# Patient Record
Sex: Female | Born: 1945
Health system: Southern US, Community
[De-identification: ages and names within clinical notes are randomized; demographics above are authoritative.]

## PROBLEM LIST (undated history)

## (undated) DIAGNOSIS — M1990 Unspecified osteoarthritis, unspecified site: Secondary | ICD-10-CM

## (undated) DIAGNOSIS — Z86711 Personal history of pulmonary embolism: Secondary | ICD-10-CM

## (undated) DIAGNOSIS — J45909 Unspecified asthma, uncomplicated: Secondary | ICD-10-CM

## (undated) DIAGNOSIS — J449 Chronic obstructive pulmonary disease, unspecified: Secondary | ICD-10-CM

## (undated) DIAGNOSIS — E785 Hyperlipidemia, unspecified: Secondary | ICD-10-CM

## (undated) DIAGNOSIS — M109 Gout, unspecified: Secondary | ICD-10-CM

## (undated) DIAGNOSIS — I10 Essential (primary) hypertension: Secondary | ICD-10-CM

## (undated) DIAGNOSIS — K219 Gastro-esophageal reflux disease without esophagitis: Secondary | ICD-10-CM

## (undated) DIAGNOSIS — H269 Unspecified cataract: Secondary | ICD-10-CM

## (undated) DIAGNOSIS — R7303 Prediabetes: Secondary | ICD-10-CM

## (undated) HISTORY — DX: Unspecified osteoarthritis, unspecified site: M19.90

## (undated) HISTORY — DX: Prediabetes: R73.03

## (undated) HISTORY — DX: Gastro-esophageal reflux disease without esophagitis: K21.9

## (undated) HISTORY — DX: Essential (primary) hypertension: I10

## (undated) HISTORY — DX: Unspecified asthma, uncomplicated: J45.909

## (undated) HISTORY — PX: BACK SURGERY: SHX140

## (undated) HISTORY — DX: Hyperlipidemia, unspecified: E78.5

## (undated) HISTORY — PX: TONSILLECTOMY: SUR1361

## (undated) HISTORY — DX: Chronic obstructive pulmonary disease, unspecified: J44.9

## (undated) HISTORY — PX: LUMBAR LAMINECTOMY: SHX95

## (undated) HISTORY — DX: Unspecified cataract: H26.9

## (undated) HISTORY — DX: Personal history of pulmonary embolism: Z86.711

## (undated) HISTORY — DX: Gout, unspecified: M10.9

---

## 2006-03-03 ENCOUNTER — Emergency Department: Payer: Self-pay | Admitting: Internal Medicine

## 2006-06-22 DIAGNOSIS — I10 Essential (primary) hypertension: Secondary | ICD-10-CM | POA: Insufficient documentation

## 2007-07-18 ENCOUNTER — Other Ambulatory Visit: Payer: Self-pay

## 2007-07-18 ENCOUNTER — Emergency Department: Payer: Self-pay

## 2008-07-09 DIAGNOSIS — J449 Chronic obstructive pulmonary disease, unspecified: Secondary | ICD-10-CM | POA: Insufficient documentation

## 2009-07-22 ENCOUNTER — Ambulatory Visit: Payer: Self-pay | Admitting: Ophthalmology

## 2009-08-05 ENCOUNTER — Ambulatory Visit: Payer: Self-pay | Admitting: Ophthalmology

## 2010-04-22 ENCOUNTER — Ambulatory Visit: Payer: Self-pay | Admitting: Family Medicine

## 2011-11-02 ENCOUNTER — Ambulatory Visit: Payer: Self-pay | Admitting: Family Medicine

## 2013-03-28 ENCOUNTER — Ambulatory Visit: Payer: Self-pay | Admitting: Family Medicine

## 2014-04-04 ENCOUNTER — Ambulatory Visit: Payer: Self-pay | Admitting: Family Medicine

## 2014-04-08 ENCOUNTER — Ambulatory Visit: Payer: Self-pay | Admitting: Family Medicine

## 2014-04-08 HISTORY — PX: BREAST EXCISIONAL BIOPSY: SUR124

## 2014-04-08 HISTORY — PX: BREAST BIOPSY: SHX20

## 2014-05-20 LAB — SURGICAL PATHOLOGY

## 2014-07-25 ENCOUNTER — Encounter: Payer: Self-pay | Admitting: Emergency Medicine

## 2014-07-25 ENCOUNTER — Other Ambulatory Visit: Payer: Self-pay | Admitting: Family Medicine

## 2014-07-30 ENCOUNTER — Ambulatory Visit (INDEPENDENT_AMBULATORY_CARE_PROVIDER_SITE_OTHER): Payer: Medicare Other | Admitting: Family Medicine

## 2014-07-30 ENCOUNTER — Encounter: Payer: Self-pay | Admitting: Family Medicine

## 2014-07-30 VITALS — BP 130/70 | HR 105 | Temp 97.4°F | Resp 18 | Ht 66.0 in | Wt 307.6 lb

## 2014-07-30 DIAGNOSIS — R739 Hyperglycemia, unspecified: Secondary | ICD-10-CM | POA: Insufficient documentation

## 2014-07-30 DIAGNOSIS — E669 Obesity, unspecified: Secondary | ICD-10-CM | POA: Diagnosis not present

## 2014-07-30 DIAGNOSIS — M10079 Idiopathic gout, unspecified ankle and foot: Secondary | ICD-10-CM | POA: Diagnosis not present

## 2014-07-30 DIAGNOSIS — M179 Osteoarthritis of knee, unspecified: Secondary | ICD-10-CM | POA: Insufficient documentation

## 2014-07-30 DIAGNOSIS — J302 Other seasonal allergic rhinitis: Secondary | ICD-10-CM | POA: Insufficient documentation

## 2014-07-30 DIAGNOSIS — I10 Essential (primary) hypertension: Secondary | ICD-10-CM

## 2014-07-30 DIAGNOSIS — M150 Primary generalized (osteo)arthritis: Secondary | ICD-10-CM

## 2014-07-30 DIAGNOSIS — M159 Polyosteoarthritis, unspecified: Secondary | ICD-10-CM

## 2014-07-30 DIAGNOSIS — E785 Hyperlipidemia, unspecified: Secondary | ICD-10-CM | POA: Diagnosis not present

## 2014-07-30 DIAGNOSIS — J4530 Mild persistent asthma, uncomplicated: Secondary | ICD-10-CM

## 2014-07-30 DIAGNOSIS — Z23 Encounter for immunization: Secondary | ICD-10-CM | POA: Insufficient documentation

## 2014-07-30 DIAGNOSIS — J45909 Unspecified asthma, uncomplicated: Secondary | ICD-10-CM | POA: Insufficient documentation

## 2014-07-30 DIAGNOSIS — M1710 Unilateral primary osteoarthritis, unspecified knee: Secondary | ICD-10-CM | POA: Insufficient documentation

## 2014-07-30 DIAGNOSIS — M1990 Unspecified osteoarthritis, unspecified site: Secondary | ICD-10-CM

## 2014-07-30 DIAGNOSIS — M109 Gout, unspecified: Secondary | ICD-10-CM | POA: Insufficient documentation

## 2014-07-30 DIAGNOSIS — R7303 Prediabetes: Secondary | ICD-10-CM | POA: Insufficient documentation

## 2014-07-30 MED ORDER — ENALAPRIL MALEATE 20 MG PO TABS: 20.0000 mg | ORAL_TABLET | Freq: Every day | ORAL | Status: DC

## 2014-07-30 MED ORDER — HYDROCHLOROTHIAZIDE 12.5 MG PO CAPS: 12.5000 mg | ORAL_CAPSULE | Freq: Every day | ORAL | Status: DC

## 2014-07-30 MED ORDER — BUDESONIDE-FORMOTEROL FUMARATE 160-4.5 MCG/ACT IN AERO: 2.0000 | INHALATION_SPRAY | Freq: Two times a day (BID) | RESPIRATORY_TRACT | Status: DC

## 2014-07-30 MED ORDER — ALLOPURINOL 100 MG PO TABS: 100.0000 mg | ORAL_TABLET | Freq: Every day | ORAL | Status: DC

## 2014-07-30 MED ORDER — ALBUTEROL SULFATE HFA 108 (90 BASE) MCG/ACT IN AERS: 1.0000 | INHALATION_SPRAY | Freq: Four times a day (QID) | RESPIRATORY_TRACT | Status: DC | PRN

## 2014-07-30 NOTE — Progress Notes (Signed)
Name: Colleen [name]   MRN: [id number]    DOB: May 07, 1945   Date:07/30/2014       Progress Note  Subjective  Chief Complaint  Chief Complaint  Patient presents with  . Hypertension  . COPD  . Hyperlipidemia    Hypertension This is a chronic problem. The current episode started more than 1 year ago. The problem has been gradually worsening since onset. The problem is controlled. Associated symptoms include shortness of breath. Pertinent negatives include no blurred vision, chest pain, headaches, neck pain, orthopnea or palpitations. There are no associated agents to hypertension. Risk factors for coronary artery disease include obesity, post-menopausal state, sedentary lifestyle and stress. Past treatments include diuretics and ACE inhibitors. The current treatment provides moderate improvement. There are no compliance problems.   Hyperlipidemia This is a chronic problem. The current episode started more than 1 year ago. The problem is controlled. Recent lipid tests were reviewed and are normal. Exacerbating diseases include obesity. Factors aggravating her hyperlipidemia include fatty foods and thiazides. Associated symptoms include myalgias and shortness of breath. Pertinent negatives include no chest pain or focal weakness. Current antihyperlipidemic treatment includes statins. The current treatment provides moderate improvement of lipids. There are no compliance problems.  Risk factors for coronary artery disease include dyslipidemia, hypertension, obesity, post-menopausal, a sedentary lifestyle and stress.  Arthritis Presents for follow-up visit. She complains of pain, stiffness and joint swelling. Affected locations include the right knee and left knee. Pertinent negatives include no diarrhea, dysuria, fever or weight loss. Her past medical history is significant for chronic back pain. There is no history of rheumatoid arthritis.  Her pertinent risk factors include overuse. Her family medical  history includes family history of osteoarthritis. Past treatments include acetaminophen, activity, OTC med, corticosteroids and NSAIDs. The treatment provided mild relief. Factors aggravating her arthritis include abduction, activity, climbing stairs, descending stairs, entering/exiting a vehicle, exposure to cold air and lifting. Compliance with prior treatments has been good.  Breathing Problem She complains of cough, difficulty breathing, shortness of breath and wheezing. There is no hemoptysis. This is a chronic problem. The current episode started more than 1 year ago. The problem occurs intermittently. The problem has been unchanged. The cough is productive of sputum. Associated symptoms include myalgias. Pertinent negatives include no chest pain, fever, headaches, heartburn, sore throat or weight loss. Her symptoms are aggravated by change in weather, climbing stairs, exposure to smoke and strenuous activity. Her symptoms are alleviated by beta-agonist and steroid inhaler. She reports moderate improvement on treatment. Her past medical history is significant for asthma and COPD.     Gout  No recent flare of gout. She remains on her usual regimen of allopurinol  Past Medical History  Diagnosis Date  . Hyperlipidemia   . Hypertension   . Gout   . Asthma   . Osteoarthrosis   . COPD (chronic obstructive pulmonary disease)     History  Substance Use Topics  . Smoking status: Former Smoker    Quit date: 11/13/2013  . Smokeless tobacco: Not on file  . Alcohol Use: Not on file     Current outpatient prescriptions:  .  albuterol (PROVENTIL HFA;VENTOLIN HFA) 108 (90 BASE) MCG/ACT inhaler, Inhale 1 puff into the lungs every 6 (six) hours as needed for wheezing or shortness of breath., Disp: 1 Inhaler, Rfl: 5 .  albuterol (PROVENTIL) (2.5 MG/3ML) 0.083% nebulizer solution, Take 2.5 mg by nebulization every 6 (six) hours as needed for wheezing or shortness of breath., Disp: ,  Rfl:  .   allopurinol (ZYLOPRIM) 100 MG tablet, Take 1 tablet (100 mg total) by mouth daily., Disp: 30 tablet, Rfl: 5 .  aspirin EC 81 MG tablet, Take 81 mg by mouth daily., Disp: , Rfl:  .  budesonide-formoterol (SYMBICORT) 160-4.5 MCG/ACT inhaler, Inhale 2 puffs into the lungs 2 (two) times daily., Disp: 1 Inhaler, Rfl: 5 .  enalapril (VASOTEC) 20 MG tablet, Take 1 tablet (20 mg total) by mouth daily., Disp: 30 tablet, Rfl: 5 .  hydrochlorothiazide (MICROZIDE) 12.5 MG capsule, Take 1 capsule (12.5 mg total) by mouth daily., Disp: 30 capsule, Rfl: 5 .  lovastatin (MEVACOR) 40 MG tablet, TAKE ONE TABLET BY MOUTH AT BEDTIME, Disp: 30 tablet, Rfl: 0 .  naproxen (NAPROSYN) 500 MG tablet, Take 500 mg by mouth 2 (two) times daily with a meal., Disp: , Rfl:   No Known Allergies  Review of Systems  Constitutional: Negative for fever, chills and weight loss.  HENT: Negative for congestion, hearing loss, sore throat and tinnitus.   Eyes: Negative for blurred vision, double vision and redness.  Respiratory: Positive for cough, shortness of breath and wheezing. Negative for hemoptysis.   Cardiovascular: Negative for chest pain, palpitations, orthopnea, claudication and leg swelling.  Gastrointestinal: Negative for heartburn, nausea, vomiting, diarrhea, constipation and blood in stool.  Genitourinary: Negative for dysuria, urgency, frequency and hematuria.  Musculoskeletal: Positive for myalgias, joint swelling, arthritis and stiffness. Negative for back pain, joint pain, falls and neck pain.  Skin: Negative for itching.  Neurological: Negative for dizziness, tingling, tremors, focal weakness, seizures, loss of consciousness, weakness and headaches.  Endo/Heme/Allergies: Does not bruise/bleed easily.  Psychiatric/Behavioral: Negative for depression and substance abuse. The patient is not nervous/anxious and does not have insomnia.      Objective  Filed Vitals:   07/30/14 0738  BP: 130/70  Pulse: 105  Temp:  97.4 F (36.3 C)  TempSrc: Oral  Resp: 18  Height: 5\' 6"  (1.676 m)  Weight: 307 lb 9.6 oz (139.526 kg)  SpO2: 94%     Physical Exam  Constitutional: She is oriented to person, place, and time and well-developed, well-nourished, and in no distress.  Morbidly obese  HENT:  Head: Normocephalic.  Eyes: EOM are normal. Pupils are equal, round, and reactive to light.  Neck: Normal range of motion. No thyromegaly present.  Cardiovascular: Normal rate, regular rhythm and normal heart sounds.   No murmur heard. Pulmonary/Chest: Effort normal and breath sounds normal.  Abdominal: Soft. Bowel sounds are normal.  Musculoskeletal: She exhibits tenderness. She exhibits no edema.  [place] arthritic changes at the knee with valgus deformity of the right knee  Neurological: She is alert and oriented to person, place, and time. No cranial nerve deficit. Gait normal.  Skin: Skin is warm and dry. No rash noted.  Psychiatric: Memory and affect normal.      Assessment and plan  1. Benign essential HTN Well-controlled  - hydrochlorothiazide (MICROZIDE) 12.5 MG capsule; Take 1 capsule (12.5 mg total) by mouth daily.  Dispense: 30 capsule; Refill: 5 - enalapril (VASOTEC) 20 MG tablet; Take 1 tablet (20 mg total) by mouth daily.  Dispense: 30 tablet; Refill: 5 - Comprehensive metabolic panel - TSH  2. Acute gout of foot, unspecified cause, unspecified laterality No recent flare - allopurinol (ZYLOPRIM) 100 MG tablet; Take 1 tablet (100 mg total) by mouth daily.  Dispense: 30 tablet; Refill: 5 - Uric acid  3. Asthma, mild persistent, uncomplicated Stable - albuterol (PROVENTIL HFA;VENTOLIN HFA) 108 (  90 BASE) MCG/ACT inhaler; Inhale 1 puff into the lungs every 6 (six) hours as needed for wheezing or shortness of breath.  Dispense: 1 Inhaler; Refill: 5 - budesonide-formoterol (SYMBICORT) 160-4.5 MCG/ACT inhaler; Inhale 2 puffs into the lungs 2 (two) times daily.  Dispense: 1 Inhaler; Refill:  5  5. Primary osteoarthritis involving multiple joints Continues follow-up with orthopedist and intermittent injections  6. HLD (hyperlipidemia) Repeat lab today - Lipid panel  7. Obesity Handout on obesity encouraged to do whatever exercise she can

## 2014-07-30 NOTE — Patient Instructions (Signed)

## 2014-07-31 LAB — COMPREHENSIVE METABOLIC PANEL
ALT: 13 IU/L (ref 0–32)
AST: 22 IU/L (ref 0–40)
Albumin/Globulin Ratio: 1 — ABNORMAL LOW (ref 1.1–2.5)
Albumin: 4 g/dL (ref 3.6–4.8)
Alkaline Phosphatase: 124 IU/L — ABNORMAL HIGH (ref 39–117)
BUN/Creatinine Ratio: 19 (ref 11–26)
BUN: 20 mg/dL (ref 8–27)
Bilirubin Total: 0.4 mg/dL (ref 0.0–1.2)
CALCIUM: 10.2 mg/dL (ref 8.7–10.3)
CO2: 26 mmol/L (ref 18–29)
Chloride: 99 mmol/L (ref 97–108)
Creatinine, Ser: 1.03 mg/dL — ABNORMAL HIGH (ref 0.57–1.00)
GFR calc Af Amer: 65 mL/min/{1.73_m2} (ref >59)
GFR calc non Af Amer: 56 mL/min/{1.73_m2} — ABNORMAL LOW (ref >59)
Globulin, Total: 4 g/dL (ref 1.5–4.5)
Glucose: 111 mg/dL — ABNORMAL HIGH (ref 65–99)
POTASSIUM: 4.2 mmol/L (ref 3.5–5.2)
SODIUM: 139 mmol/L (ref 134–144)
Total Protein: 8 g/dL (ref 6.0–8.5)

## 2014-07-31 LAB — LIPID PANEL
CHOL/HDL RATIO: 2 ratio (ref 0.0–4.4)
Cholesterol, Total: 161 mg/dL (ref 100–199)
HDL: 82 mg/dL (ref >39)
LDL CALC: 62 mg/dL (ref 0–99)
Triglycerides: 83 mg/dL (ref 0–149)
VLDL CHOLESTEROL CAL: 17 mg/dL (ref 5–40)

## 2014-07-31 LAB — URIC ACID: URIC ACID: 4.8 mg/dL (ref 2.5–7.1)

## 2014-07-31 LAB — TSH: TSH: 2.59 u[IU]/mL (ref 0.450–4.500)

## 2014-07-31 NOTE — Progress Notes (Signed)
Patient notified by phone.

## 2014-08-26 ENCOUNTER — Other Ambulatory Visit: Payer: Self-pay | Admitting: Family Medicine

## 2014-09-25 ENCOUNTER — Other Ambulatory Visit: Payer: Self-pay | Admitting: Family Medicine

## 2014-10-21 ENCOUNTER — Other Ambulatory Visit: Payer: Self-pay | Admitting: Family Medicine

## 2014-11-05 ENCOUNTER — Telehealth: Payer: Self-pay | Admitting: Family Medicine

## 2014-11-05 DIAGNOSIS — J4530 Mild persistent asthma, uncomplicated: Secondary | ICD-10-CM

## 2014-11-05 NOTE — Telephone Encounter (Signed)
Patient insurance does not cover ProAir, however it will cover Ventolin 48mcg. Please send to [facility]. She does have appointment for 12-02-14

## 2014-11-08 MED ORDER — ALBUTEROL SULFATE HFA 108 (90 BASE) MCG/ACT IN AERS: 1.0000 | INHALATION_SPRAY | Freq: Four times a day (QID) | RESPIRATORY_TRACT | Status: DC | PRN

## 2014-11-08 NOTE — Telephone Encounter (Signed)
Script sent to pharmacy.

## 2014-11-15 ENCOUNTER — Other Ambulatory Visit: Payer: Self-pay | Admitting: Family Medicine

## 2014-11-27 ENCOUNTER — Other Ambulatory Visit: Payer: Self-pay | Admitting: Family Medicine

## 2014-12-02 ENCOUNTER — Encounter: Payer: Self-pay | Admitting: Family Medicine

## 2014-12-02 ENCOUNTER — Ambulatory Visit (INDEPENDENT_AMBULATORY_CARE_PROVIDER_SITE_OTHER): Payer: Medicare Other | Admitting: Family Medicine

## 2014-12-02 VITALS — BP 122/74 | HR 106 | Temp 97.7°F | Resp 16 | Ht 66.0 in | Wt 309.0 lb

## 2014-12-02 DIAGNOSIS — M159 Polyosteoarthritis, unspecified: Secondary | ICD-10-CM

## 2014-12-02 DIAGNOSIS — M1000 Idiopathic gout, unspecified site: Secondary | ICD-10-CM

## 2014-12-02 DIAGNOSIS — J42 Unspecified chronic bronchitis: Secondary | ICD-10-CM

## 2014-12-02 DIAGNOSIS — I10 Essential (primary) hypertension: Secondary | ICD-10-CM

## 2014-12-02 DIAGNOSIS — M150 Primary generalized (osteo)arthritis: Secondary | ICD-10-CM | POA: Diagnosis not present

## 2014-12-02 DIAGNOSIS — E162 Hypoglycemia, unspecified: Secondary | ICD-10-CM

## 2014-12-02 DIAGNOSIS — J4530 Mild persistent asthma, uncomplicated: Secondary | ICD-10-CM | POA: Diagnosis not present

## 2014-12-02 LAB — POCT GLYCOSYLATED HEMOGLOBIN (HGB A1C): HEMOGLOBIN A1C: 6

## 2014-12-02 LAB — GLUCOSE, POCT (MANUAL RESULT ENTRY): POC Glucose: 91 mg/dl (ref 70–99)

## 2014-12-02 MED ORDER — ALBUTEROL SULFATE (2.5 MG/3ML) 0.083% IN NEBU: 2.5000 mg | INHALATION_SOLUTION | Freq: Once | RESPIRATORY_TRACT | Status: DC

## 2014-12-02 NOTE — Progress Notes (Signed)
[name] 20 mg daily and hydrochlorothiazide 12.5 mg daily lisinopril 20 mg daily lisinopril lisinopril lisinoprilme: Colleen [name]   MRN: [id number]    DOB: November 15, 1945   Date:12/02/2014       Progress Note  Subjective  Chief Complaint  Chief Complaint  Patient presents with  . Hypertension    pt here for 4 month follow up  . Hyperlipidemia  . Gout  . COPD    HPI  Hypertension    Patient presents for follow-up of hypertension. It has been present for over 5 years.  Patient states that there is compliance with medical regimen which consists of lisinopril 20 mg daily and hydrochlorothiazide 12.5 mg daily . There is no end organ disease. Cardiac risk factors include hypertension hyperlipidemia and diabetes obesity and sedentary lifestyle.  Exercise regimen consist of  .  Diet consist of some salt restriction.  COPD history of present illness  Patient has a long-standing history of COPD. She stopped smoking many years ago. She currently has minimal dyspnea with exertion and no significant cough or wheezing. Current medical regimen includes Proventil when necessary and Symbicort  Hyperlipidemia  Patient has a history of hyperlipidemia for over 5 years.  Current medical regimen consist of lovastatin 40 mg daily at bedtime I's  Compliance is good .  Diet and exercise are currently followed fairly well .  Risk factors for cardiovascular disease include hyperlipidemia hypertension obesity sedentary lifestyle. Glucose intolerance.   There have been no side effects from the medication.    Gout  A recent flare of gout. She's currently on allopurinol.    Past Medical History  Diagnosis Date  . Hyperlipidemia   . Hypertension   . Gout   . Asthma   . Osteoarthrosis   . COPD (chronic obstructive pulmonary disease) ([place])     Social History  Substance Use Topics  . Smoking status: Former Smoker    Quit date: 11/13/2013  . Smokeless tobacco: Not on file  . Alcohol Use: Not on file      Current outpatient prescriptions:  .  albuterol (PROVENTIL HFA;VENTOLIN HFA) 108 (90 BASE) MCG/ACT inhaler, Inhale 1 puff into the lungs every 6 (six) hours as needed for wheezing or shortness of breath., Disp: 1 Inhaler, Rfl: 5 .  allopurinol (ZYLOPRIM) 100 MG tablet, Take 1 tablet (100 mg total) by mouth daily., Disp: 30 tablet, Rfl: 5 .  aspirin EC 81 MG tablet, Take 81 mg by mouth daily., Disp: , Rfl:  .  budesonide-formoterol (SYMBICORT) 160-4.5 MCG/ACT inhaler, Inhale 2 puffs into the lungs 2 (two) times daily., Disp: 1 Inhaler, Rfl: 5 .  enalapril (VASOTEC) 20 MG tablet, Take 1 tablet (20 mg total) by mouth daily., Disp: 30 tablet, Rfl: 5 .  hydrochlorothiazide (MICROZIDE) 12.5 MG capsule, Take 1 capsule (12.5 mg total) by mouth daily., Disp: 30 capsule, Rfl: 5 .  lovastatin (MEVACOR) 40 MG tablet, TAKE ONE TABLET BY MOUTH AT BEDTIME, Disp: 30 tablet, Rfl: 0 .  naproxen (NAPROSYN) 500 MG tablet, TAKE ONE TABLET BY MOUTH TWICE DAILY, Disp: 60 tablet, Rfl: 0  No Known Allergies  Review of Systems  Constitutional: Negative for fever, chills and weight loss.  HENT: Negative for congestion, hearing loss, sore throat and tinnitus.   Eyes: Negative for blurred vision, double vision and redness.  Respiratory: Positive for cough and wheezing. Negative for hemoptysis and shortness of breath.   Cardiovascular: Negative for chest pain, palpitations, orthopnea, claudication and leg swelling.  Gastrointestinal: Negative for heartburn,  nausea, vomiting, diarrhea, constipation and blood in stool.  Genitourinary: Negative for dysuria, urgency, frequency and hematuria.  Musculoskeletal: Positive for back pain. Negative for myalgias, joint pain, falls and neck pain.  Skin: Negative for itching.  Neurological: Negative for dizziness, tingling, tremors, focal weakness, seizures, loss of consciousness, weakness and headaches.  Endo/Heme/Allergies: Does not bruise/bleed easily.   Psychiatric/Behavioral: Negative for depression and substance abuse. The patient is not nervous/anxious and does not have insomnia.      Objective  Filed Vitals:   12/02/14 0814  BP: 122/74  Pulse: 106  Temp: 97.7 F (36.5 C)  Resp: 16  Height: 5\' 6"  (1.676 m)  Weight: 309 lb (140.161 kg)  SpO2: 96%     Physical Exam  Constitutional: She is oriented to person, place, and time and well-developed, well-nourished, and in no distress.  Obese and with some paucity of movement secondary to her joint pain  HENT:  Head: Normocephalic.  Eyes: EOM are normal. Pupils are equal, round, and reactive to light.  Neck: Normal range of motion. No thyromegaly present.  Cardiovascular: Normal rate, regular rhythm and normal heart sounds.   No murmur heard. Pulmonary/Chest: Effort normal and breath sounds normal.  Abdominal: Soft. Bowel sounds are normal.  Musculoskeletal: Normal range of motion. She exhibits tenderness. She exhibits no edema.  Neurological: She is alert and oriented to person, place, and time. No cranial nerve deficit. Gait normal.  Skin: Skin is warm and dry. No rash noted.  Psychiatric: Memory and affect normal.      Assessment & Plan  . 1. Chronic bronchitis, unspecified chronic bronchitis type (HCC)  - albuterol (PROVENTIL) (2.5 MG/3ML) 0.083% nebulizer solution 2.5 mg; Take 3 mLs (2.5 mg total) by nebulization once. - Spirometry: Pre & Post Eval  2. Asthma, mild persistent, uncomplicated stable  3. Primary osteoarthritis involving multiple joints   4. Benign essential HTN Well controlled  5. Idiopathic gout, unspecified chronicity, unspecified site   6. Hyperglycemia  - POCT Glucose (CBG) - POCT HgB A1C

## 2014-12-25 ENCOUNTER — Other Ambulatory Visit: Payer: Self-pay | Admitting: Family Medicine

## 2014-12-30 ENCOUNTER — Encounter: Payer: Self-pay | Admitting: Emergency Medicine

## 2014-12-30 ENCOUNTER — Emergency Department
Admission: EM | Admit: 2014-12-30 | Discharge: 2014-12-31 | Disposition: A | Discharge status: Home / Self Care | Payer: Medicare Other | Attending: Emergency Medicine | Admitting: Emergency Medicine

## 2014-12-30 DIAGNOSIS — Z791 Long term (current) use of non-steroidal anti-inflammatories (NSAID): Secondary | ICD-10-CM | POA: Diagnosis not present

## 2014-12-30 DIAGNOSIS — E860 Dehydration: Secondary | ICD-10-CM

## 2014-12-30 DIAGNOSIS — Z7982 Long term (current) use of aspirin: Secondary | ICD-10-CM | POA: Insufficient documentation

## 2014-12-30 DIAGNOSIS — R42 Dizziness and giddiness: Secondary | ICD-10-CM | POA: Insufficient documentation

## 2014-12-30 DIAGNOSIS — Z7951 Long term (current) use of inhaled steroids: Secondary | ICD-10-CM | POA: Insufficient documentation

## 2014-12-30 DIAGNOSIS — I10 Essential (primary) hypertension: Secondary | ICD-10-CM | POA: Diagnosis not present

## 2014-12-30 DIAGNOSIS — R000 Tachycardia, unspecified: Secondary | ICD-10-CM | POA: Diagnosis not present

## 2014-12-30 DIAGNOSIS — R51 Headache: Secondary | ICD-10-CM | POA: Diagnosis not present

## 2014-12-30 DIAGNOSIS — Z87891 Personal history of nicotine dependence: Secondary | ICD-10-CM | POA: Diagnosis not present

## 2014-12-30 LAB — URINALYSIS COMPLETE WITH MICROSCOPIC (ARMC ONLY)
BACTERIA UA: NONE SEEN
BILIRUBIN URINE: NEGATIVE
GLUCOSE, UA: NEGATIVE mg/dL
Ketones, ur: NEGATIVE mg/dL
Leukocytes, UA: NEGATIVE
Nitrite: NEGATIVE
Protein, ur: NEGATIVE mg/dL
Specific Gravity, Urine: 1.008 (ref 1.005–1.030)
pH: 6 (ref 5.0–8.0)

## 2014-12-30 LAB — CBC
HEMATOCRIT: 39.7 % (ref 35.0–47.0)
HEMOGLOBIN: 13.2 g/dL (ref 12.0–16.0)
MCH: 30.6 pg (ref 26.0–34.0)
MCHC: 33.2 g/dL (ref 32.0–36.0)
MCV: 92 fL (ref 80.0–100.0)
Platelets: 296 10*3/uL (ref 150–440)
RBC: 4.31 MIL/uL (ref 3.80–5.20)
RDW: 14.8 % — AB (ref 11.5–14.5)
WBC: 10.8 10*3/uL (ref 3.6–11.0)

## 2014-12-30 LAB — BASIC METABOLIC PANEL
ANION GAP: 6 (ref 5–15)
BUN: 26 mg/dL — AB (ref 6–20)
CO2: 30 mmol/L (ref 22–32)
Calcium: 9.6 mg/dL (ref 8.9–10.3)
Chloride: 102 mmol/L (ref 101–111)
Creatinine, Ser: 1.19 mg/dL — ABNORMAL HIGH (ref 0.44–1.00)
GFR calc Af Amer: 53 mL/min — ABNORMAL LOW (ref >60)
GFR, EST NON AFRICAN AMERICAN: 46 mL/min — AB (ref >60)
GLUCOSE: 100 mg/dL — AB (ref 65–99)
Potassium: 3.6 mmol/L (ref 3.5–5.1)
Sodium: 138 mmol/L (ref 135–145)

## 2014-12-30 LAB — GLUCOSE, CAPILLARY: GLUCOSE-CAPILLARY: 103 mg/dL — AB (ref 65–99)

## 2014-12-30 LAB — TROPONIN I

## 2014-12-30 MED ORDER — SODIUM CHLORIDE 0.9 % IV BOLUS (SEPSIS)
1000.0000 mL | Freq: Once | INTRAVENOUS | Status: AC
  Administered 2014-12-31: 1000 mL via INTRAVENOUS

## 2014-12-30 NOTE — ED Notes (Addendum)
pt reports lightheaded and dizzy since thurday states gets dizzy when bending over and a headache top of head started tonight

## 2014-12-30 NOTE — ED Notes (Signed)
CBG done.result is 103

## 2014-12-30 NOTE — ED Provider Notes (Signed)
[facility] Emergency Department Provider Note  ____________________________________________  Time seen: Approximately 11:09 PM  I have reviewed the triage vital signs and the nursing notes.   HISTORY  Chief Complaint Dizziness    HPI Colleen [name] is a 69 y.o. female comes into the hospital tonight with dizziness and lightheadedness. The patient reports that she's been having episodes of dizziness since Friday. She reports that she had another episode on Saturday but was fine on Sunday. Today at 4 PM while she was sitting at the computer she had an episode of dizziness. It did not last long and she reports that she ate supper but then again had a subsequent episode at 7 PM so she decided to come in and get checked out. The patient reports that she has been eating and drinking well but does not drink any extra fluids during the dizzy episode. The patient reports that each episode lasts approximately 5 minutes. She had a mild headache to the top of her head previously but reports this gone now. The patient denies any chest pain or shortness of breath she also has no abdominal pain nausea or vomiting. The patient does take a fluid pill but reports that nothing else has changed to cause this dizziness. The patient denies feeling as though the room is spinning.   Past Medical History  Diagnosis Date  . Hyperlipidemia   . Hypertension   . Gout   . Asthma   . Osteoarthrosis   . COPD (chronic obstructive pulmonary disease) [facility])     Patient Active Problem List   Diagnosis Date Noted  . Allergic rhinitis, seasonal 07/30/2014  . Airway hyperreactivity 07/30/2014  . Elevated blood sugar 07/30/2014  . Gout 07/30/2014  . HLD (hyperlipidemia) 07/30/2014  . Encounter for immunization 07/30/2014  . Arthritis, degenerative 07/30/2014  . Borderline diabetes 07/30/2014  . Asthma, mild persistent 07/30/2014  . CAFL (chronic airflow limitation) ([place]) 07/09/2008  . Benign  essential HTN 06/22/2006    Past Surgical History  Procedure Laterality Date  . Lumbar laminectomy      Current Outpatient Rx  Name  Route  Sig  Dispense  Refill  . albuterol (PROVENTIL HFA;VENTOLIN HFA) 108 (90 BASE) MCG/ACT inhaler   Inhalation   Inhale 1 puff into the lungs every 6 (six) hours as needed for wheezing or shortness of breath.   1 Inhaler   5   . allopurinol (ZYLOPRIM) 100 MG tablet   Oral   Take 1 tablet (100 mg total) by mouth daily.   30 tablet   5   . aspirin EC 81 MG tablet   Oral   Take 81 mg by mouth daily.         . budesonide-formoterol (SYMBICORT) 160-4.5 MCG/ACT inhaler   Inhalation   Inhale 2 puffs into the lungs 2 (two) times daily.   1 Inhaler   5   . enalapril (VASOTEC) 20 MG tablet   Oral   Take 1 tablet (20 mg total) by mouth daily.   30 tablet   5   . hydrochlorothiazide (MICROZIDE) 12.5 MG capsule   Oral   Take 1 capsule (12.5 mg total) by mouth daily.   30 capsule   5   . lovastatin (MEVACOR) 40 MG tablet   Oral   Take 40 mg by mouth at bedtime.         . naproxen (NAPROSYN) 500 MG tablet   Oral   Take 500 mg by mouth 2 (two)  times daily.           Allergies Review of patient's allergies indicates no known allergies.  Family History  Problem Relation Age of Onset  . Diabetes Mother     Social History Social History  Substance Use Topics  . Smoking status: Former Smoker    Quit date: 11/13/2013  . Smokeless tobacco: None  . Alcohol Use: 0.0 oz/week    0 Standard drinks or equivalent per week     Comment: social     Review of Systems Constitutional: No fever/chills Eyes: No visual changes. ENT: No sore throat. Cardiovascular: Denies chest pain. Respiratory: Denies shortness of breath. Gastrointestinal: No abdominal pain.  No nausea, no vomiting.  No diarrhea.  No constipation. Genitourinary: Negative for dysuria. Musculoskeletal: Negative for back pain. Skin: Negative for rash. Neurological:  Dizziness and lightheadedness.  10-point ROS otherwise negative.  ____________________________________________   PHYSICAL EXAM:  VITAL SIGNS: ED Triage Vitals  Enc Vitals Group     BP 12/30/14 1955 158/91 mmHg     Pulse Rate 12/30/14 1955 126     Resp 12/30/14 1955 20     Temp 12/30/14 1955 97.7 F (36.5 C)     Temp Source 12/30/14 1955 Oral     SpO2 12/30/14 1955 98 %     Weight 12/30/14 1955 309 lb (140.161 kg)     Height 12/30/14 1955 5\' 4"  (1.626 m)     Head Cir --      Peak Flow --      Pain Score 12/30/14 2001 1     Pain Loc --      Pain Edu? --      Excl. in [place]? --     Constitutional: Alert and oriented. Well appearing and in no acute distress. Eyes: Conjunctivae are normal. PERRL. EOMI. Head: Atraumatic. Nose: No congestion/rhinnorhea. Mouth/Throat: Mucous membranes are moist.  Oropharynx non-erythematous. Cardiovascular: tachycardia, regular rhythm. Grossly normal heart sounds.  Good peripheral circulation. Respiratory: Normal respiratory effort.  No retractions. Lungs CTAB. Gastrointestinal: Soft and nontender. No distention. Positive bowel sounds Musculoskeletal: No lower extremity tenderness nor edema.  Neurologic:  Normal speech and language. No gross focal neurologic deficits are appreciated.  Skin:  Skin is warm, dry and intact.  Psychiatric: Mood and affect are normal.   ____________________________________________   LABS (all labs ordered are listed, but only abnormal results are displayed)  Labs Reviewed  BASIC METABOLIC PANEL - Abnormal; Notable for the following:    Glucose, Bld 100 (*)    BUN 26 (*)    Creatinine, Ser 1.19 (*)    GFR calc non Af Amer 46 (*)    GFR calc Af Amer 53 (*)    All other components within normal limits  CBC - Abnormal; Notable for the following:    RDW 14.8 (*)    All other components within normal limits  URINALYSIS COMPLETEWITH MICROSCOPIC ([facility] ONLY) - Abnormal; Notable for the following:    Color, Urine  STRAW (*)    APPearance CLEAR (*)    Hgb urine dipstick 1+ (*)    Squamous Epithelial / LPF 0-5 (*)    All other components within normal limits  GLUCOSE, CAPILLARY - Abnormal; Notable for the following:    Glucose-Capillary 103 (*)    All other components within normal limits  TROPONIN I  TROPONIN I  CBG MONITORING, ED   ____________________________________________  EKG  ED ECG REPORT I, Loney Hering, the attending physician, personally viewed and  interpreted this ECG.   Date: 12/30/2014  EKG Time: 2003  Rate: 106  Rhythm: sinus tachycardia  Axis: normal  Intervals:none  ST&T Change: none  ____________________________________________  RADIOLOGY  CT head: Unremarkable noncontrast CT of the head ____________________________________________   PROCEDURES  Procedure(s) performed: None  Critical Care performed: No  ____________________________________________   INITIAL IMPRESSION / ASSESSMENT AND PLAN / ED COURSE  Pertinent labs & imaging results that were available during my care of the patient were reviewed by me and considered in my medical decision making (see chart for details).  This is a 69 year old female who comes in today with lightheadedness and dizziness. We did do orthostatic vital signs and the patient's heart rate when standing went up to 128 from 106. I will give the patient liter of normal saline do a CT scan to evaluate her brain and then evaluate her after she's received her fluids.  After the fluids the patient's heart rate improved at rest and her dizziness also improved. The patient had 2 troponins that were negative. I will discharge the patient to home and have her follow-up with her primary care physician. ____________________________________________   FINAL CLINICAL IMPRESSION(S) / ED DIAGNOSES  Final diagnoses:  Dizziness  Dehydration      Loney Hering, MD 12/31/14 [phone]

## 2014-12-31 ENCOUNTER — Emergency Department: Payer: Medicare Other

## 2014-12-31 LAB — TROPONIN I: Troponin I: <0.03 ng/mL (ref <0.031)

## 2014-12-31 NOTE — ED Notes (Signed)
Calling lab to check on trop.

## 2014-12-31 NOTE — Discharge Instructions (Signed)
Dehydration, Adult Dehydration is a condition in which you do not have enough fluid or water in your body. It happens when you take in less fluid than you lose. Vital organs such as the kidneys, brain, and heart cannot function without a proper amount of fluids. Any loss of fluids from the body can cause dehydration.  Dehydration can range from mild to severe. This condition should be treated right away to help prevent it from becoming severe. CAUSES  This condition may be caused by:  Vomiting.  Diarrhea.  Excessive sweating, such as when exercising in hot or humid weather.  Not drinking enough fluid during strenuous exercise or during an illness.  Excessive urine output.  Fever.  Certain medicines. RISK FACTORS This condition is more likely to develop in:  People who are taking certain medicines that cause the body to lose excess fluid (diuretics).   People who have a chronic illness, such as diabetes, that may increase urination.  Older adults.   People who live at high altitudes.   People who participate in endurance sports.  SYMPTOMS  Mild Dehydration  Thirst.  Dry lips.  Slightly dry mouth.  Dry, warm skin. Moderate Dehydration  Very dry mouth.   Muscle cramps.   Dark urine and decreased urine production.   Decreased tear production.   Headache.   Light-headedness, especially when you stand up from a sitting position.  Severe Dehydration  Changes in skin.   Cold and clammy skin.   Skin does not spring back quickly when lightly pinched and released.   Changes in body fluids.   Extreme thirst.   No tears.   Not able to sweat when body temperature is high, such as in hot weather.   Minimal urine production.   Changes in vital signs.   Rapid, weak pulse (more than 100 beats per minute when you are sitting still).   Rapid breathing.   Low blood pressure.   Other changes.   Sunken eyes.   Cold hands and feet.    Confusion.  Lethargy and difficulty being awakened.  Fainting (syncope).   Short-term weight loss.   Unconsciousness. DIAGNOSIS  This condition may be diagnosed based on your symptoms. You may also have tests to determine how severe your dehydration is. These tests may include:   Urine tests.   Blood tests.  TREATMENT  Treatment for this condition depends on the severity. Mild or moderate dehydration can often be treated at home. Treatment should be started right away. Do not wait until dehydration becomes severe. Severe dehydration needs to be treated at the hospital. Treatment for Mild Dehydration  Drinking plenty of water to replace the fluid you have lost.   Replacing minerals in your blood (electrolytes) that you may have lost.  Treatment for Moderate Dehydration  Consuming oral rehydration solution (ORS). Treatment for Severe Dehydration  Receiving fluid through an IV tube.   Receiving electrolyte solution through a feeding tube that is passed through your nose and into your stomach (nasogastric tube or NG tube).  Correcting any abnormalities in electrolytes. HOME CARE INSTRUCTIONS   Drink enough fluid to keep your urine clear or pale yellow.   Drink water or fluid slowly by taking small sips. You can also try sucking on ice cubes.  Have food or beverages that contain electrolytes. Examples include bananas and sports drinks.  Take over-the-counter and prescription medicines only as told by your health care provider.   Prepare ORS according to the manufacturer's instructions. Take sips  of ORS every 5 minutes until your urine returns to normal.  If you have vomiting or diarrhea, continue to try to drink water, ORS, or both.   If you have diarrhea, avoid:   Beverages that contain caffeine.   Fruit juice.   Milk.   Carbonated soft drinks.  Do not take salt tablets. This can lead to the condition of having too much sodium in your body  (hypernatremia).  SEEK MEDICAL CARE IF:  You cannot eat or drink without vomiting.  You have had moderate diarrhea during a period of more than 24 hours.  You have a fever. SEEK IMMEDIATE MEDICAL CARE IF:   You have extreme thirst.  You have severe diarrhea.  You have not urinated in 6-8 hours, or you have urinated only a small amount of very dark urine.  You have shriveled skin.  You are dizzy, confused, or both.   This information is not intended to replace advice given to you by your health care provider. Make sure you discuss any questions you have with your health care provider.   Document Released: 01/11/2005 Document Revised: 10/02/2014 Document Reviewed: 05/29/2014 Elsevier Interactive Patient Education 2016 [employer]  Dizziness Dizziness is a common problem. It is a feeling of unsteadiness or light-headedness. You may feel like you are about to faint. Dizziness can lead to injury if you stumble or fall. Anyone can become dizzy, but dizziness is more common in older adults. This condition can be caused by a number of things, including medicines, dehydration, or illness. HOME CARE INSTRUCTIONS Taking these steps may help with your condition: Eating and Drinking  Drink enough fluid to keep your urine clear or pale yellow. This helps to keep you from becoming dehydrated. Try to drink more clear fluids, such as water.  Do not drink alcohol.  Limit your caffeine intake if directed by your health care provider.  Limit your salt intake if directed by your health care provider. Activity  Avoid making quick movements.  Rise slowly from chairs and steady yourself until you feel okay.  In the morning, first sit up on the side of the bed. When you feel okay, stand slowly while you hold onto something until you know that your balance is fine.  Move your legs often if you need to stand in one place for a long time. Tighten and relax your muscles in your legs while you  are standing.  Do not drive or operate heavy machinery if you feel dizzy.  Avoid bending down if you feel dizzy. Place items in your home so that they are easy for you to reach without leaning over. Lifestyle  Do not use any tobacco products, including cigarettes, chewing tobacco, or electronic cigarettes. If you need help quitting, ask your health care provider.  Try to reduce your stress level, such as with yoga or meditation. Talk with your health care provider if you need help. General Instructions  Watch your dizziness for any changes.  Take medicines only as directed by your health care provider. Talk with your health care provider if you think that your dizziness is caused by a medicine that you are taking.  Tell a friend or a family member that you are feeling dizzy. If he or she notices any changes in your behavior, have this person call your health care provider.  Keep all follow-up visits as directed by your health care provider. This is important. SEEK MEDICAL CARE IF:  Your dizziness does not go away.  Your dizziness or light-headedness gets worse.  You feel nauseous.  You have reduced hearing.  You have new symptoms.  You are unsteady on your feet or you feel like the room is spinning. SEEK IMMEDIATE MEDICAL CARE IF:  You vomit or have diarrhea and are unable to eat or drink anything.  You have problems talking, walking, swallowing, or using your arms, hands, or legs.  You feel generally weak.  You are not thinking clearly or you have trouble forming sentences. It may take a friend or family member to notice this.  You have chest pain, abdominal pain, shortness of breath, or sweating.  Your vision changes.  You notice any bleeding.  You have a headache.  You have neck pain or a stiff neck.  You have a fever.   This information is not intended to replace advice given to you by your health care provider. Make sure you discuss any questions you have  with your health care provider.   Document Released: 07/07/2000 Document Revised: 05/28/2014 Document Reviewed: 01/07/2014 Elsevier Interactive Patient Education [employer].

## 2015-01-02 ENCOUNTER — Encounter: Payer: Self-pay | Admitting: Family Medicine

## 2015-01-02 ENCOUNTER — Ambulatory Visit (INDEPENDENT_AMBULATORY_CARE_PROVIDER_SITE_OTHER): Payer: Medicare Other | Admitting: Family Medicine

## 2015-01-02 ENCOUNTER — Ambulatory Visit: Payer: Medicare Other | Admitting: Family Medicine

## 2015-01-02 VITALS — BP 126/66 | HR 113 | Temp 98.4°F | Resp 18 | Ht 64.0 in | Wt 303.3 lb

## 2015-01-02 DIAGNOSIS — J302 Other seasonal allergic rhinitis: Secondary | ICD-10-CM | POA: Diagnosis not present

## 2015-01-02 DIAGNOSIS — Z780 Asymptomatic menopausal state: Secondary | ICD-10-CM | POA: Diagnosis not present

## 2015-01-02 DIAGNOSIS — I10 Essential (primary) hypertension: Secondary | ICD-10-CM

## 2015-01-02 DIAGNOSIS — R42 Dizziness and giddiness: Secondary | ICD-10-CM | POA: Diagnosis not present

## 2015-01-02 DIAGNOSIS — J4530 Mild persistent asthma, uncomplicated: Secondary | ICD-10-CM | POA: Diagnosis not present

## 2015-01-02 DIAGNOSIS — E785 Hyperlipidemia, unspecified: Secondary | ICD-10-CM

## 2015-01-02 DIAGNOSIS — M150 Primary generalized (osteo)arthritis: Secondary | ICD-10-CM

## 2015-01-02 DIAGNOSIS — R7303 Prediabetes: Secondary | ICD-10-CM | POA: Diagnosis not present

## 2015-01-02 DIAGNOSIS — E860 Dehydration: Secondary | ICD-10-CM | POA: Diagnosis not present

## 2015-01-02 DIAGNOSIS — R7309 Other abnormal glucose: Secondary | ICD-10-CM | POA: Diagnosis not present

## 2015-01-02 DIAGNOSIS — R739 Hyperglycemia, unspecified: Secondary | ICD-10-CM

## 2015-01-02 DIAGNOSIS — M1990 Unspecified osteoarthritis, unspecified site: Secondary | ICD-10-CM | POA: Diagnosis not present

## 2015-01-02 DIAGNOSIS — M159 Polyosteoarthritis, unspecified: Secondary | ICD-10-CM

## 2015-01-02 NOTE — Progress Notes (Signed)
Name: Colleen [name]   MRN: [id number]    DOB: July 18, 1945   Date:01/02/2015       Progress Note  Subjective  Chief Complaint  Chief Complaint  Patient presents with  . Hospitalization Follow-up    pt here for ER follow up for dehydration    HPI  Dizziness and dehydration  Patient was seen in emergency department 3 days ago for recurrent episodes of dizziness and lightheadedness. There was no syncope and no chest pain. There is no true vertiginous component. She was not orthostatic in the emergency room. CT scan was performed emergent department was negative for any acute changes. She was given at least 2 L of IV fluids with improvement of her symptomatology. There is no nausea or vomiting. The plan was elevated and creatinine modestly elevated as well.  Hypertension   Patient presents for follow-up of hypertension. It has been present for over 5 years.  Patient states that there is compliance with medical regimen which consists of HCTZ 12.5 mg daily and   enalapril 20 mg daily . There is no end organ disease. Cardiac risk factors include hypertension hyperlipidemia and diabetes.  Exercise regimen consist of none .  Diet consist of some salt restriction .  Prerenal azotemia  Laboratory emergency room showed BUN/creatinine and creatinine be slightly elevated  Past Medical History  Diagnosis Date  . Hyperlipidemia   . Hypertension   . Gout   . Asthma   . Osteoarthrosis   . COPD (chronic obstructive pulmonary disease) ([place])     Social History  Substance Use Topics  . Smoking status: Former Smoker    Quit date: 11/13/2013  . Smokeless tobacco: Not on file  . Alcohol Use: 0.0 oz/week    0 Standard drinks or equivalent per week     Comment: social      Current outpatient prescriptions:  .  albuterol (PROVENTIL HFA;VENTOLIN HFA) 108 (90 BASE) MCG/ACT inhaler, Inhale 1 puff into the lungs every 6 (six) hours as needed for wheezing or shortness of breath., Disp: 1 Inhaler, Rfl:  5 .  allopurinol (ZYLOPRIM) 100 MG tablet, Take 1 tablet (100 mg total) by mouth daily., Disp: 30 tablet, Rfl: 5 .  aspirin EC 81 MG tablet, Take 81 mg by mouth daily., Disp: , Rfl:  .  budesonide-formoterol (SYMBICORT) 160-4.5 MCG/ACT inhaler, Inhale 2 puffs into the lungs 2 (two) times daily., Disp: 1 Inhaler, Rfl: 5 .  enalapril (VASOTEC) 20 MG tablet, Take 1 tablet (20 mg total) by mouth daily., Disp: 30 tablet, Rfl: 5 .  hydrochlorothiazide (MICROZIDE) 12.5 MG capsule, Take 1 capsule (12.5 mg total) by mouth daily., Disp: 30 capsule, Rfl: 5 .  lovastatin (MEVACOR) 40 MG tablet, Take 40 mg by mouth at bedtime., Disp: , Rfl:  .  naproxen (NAPROSYN) 500 MG tablet, Take 500 mg by mouth 2 (two) times daily., Disp: , Rfl:   Current facility-administered medications:  .  albuterol (PROVENTIL) (2.5 MG/3ML) 0.083% nebulizer solution 2.5 mg, 2.5 mg, Nebulization, Once, Ashok Norris, MD  No Known Allergies  Review of Systems  Constitutional: Positive for malaise/fatigue. Negative for fever, chills and weight loss.  HENT: Negative for congestion, hearing loss, sore throat and tinnitus.   Eyes: Negative for blurred vision, double vision and redness.  Respiratory: Negative for cough, hemoptysis and shortness of breath.   Cardiovascular: Negative for chest pain, palpitations, orthopnea, claudication and leg swelling.  Gastrointestinal: Negative for heartburn, nausea, vomiting, diarrhea, constipation and blood in stool.  Genitourinary:  Negative for dysuria, urgency, frequency and hematuria.  Musculoskeletal: Positive for myalgias, back pain and joint pain. Negative for falls and neck pain.  Skin: Negative for itching.  Neurological: Negative for dizziness, tingling, tremors, focal weakness, seizures, loss of consciousness, weakness and headaches.  Endo/Heme/Allergies: Does not bruise/bleed easily.  Psychiatric/Behavioral: Negative for depression and substance abuse. The patient is not  nervous/anxious and does not have insomnia.      Objective  Filed Vitals:   01/02/15 0937  BP: 126/66  Pulse: 113  Temp: 98.4 F (36.9 C)  Resp: 18  Height: 5\' 4"  (1.626 m)  Weight: 303 lb 5 oz (137.582 kg)  SpO2: 95%     Physical Exam  Constitutional: She is oriented to person, place, and time.  Morbidly obese in no acute distress  HENT:  Head: Normocephalic.  Eyes: EOM are normal. Pupils are equal, round, and reactive to light.  Neck: Normal range of motion. No thyromegaly present.  Cardiovascular: Normal rate, regular rhythm and normal heart sounds.   No murmur heard. Pulmonary/Chest: Effort normal and breath sounds normal.  Musculoskeletal: She exhibits no edema.  Moderately severe ARTHRITIC changes particularly in the knees  Neurological: She is alert and oriented to person, place, and time. No cranial nerve deficit. Gait normal.  Skin: Skin is warm and dry. No rash noted.  Psychiatric: Memory and affect normal.      Assessment & Plan  1. Dehydration  recheck CMP   2. Dizziness Secondary to 1  3. Benign essential HTN We'll hold HCTZ and recheck in 2 weeks  4. Asthma, mild persistent, uncomplicated Stable  5. Allergic rhinitis, seasonal Stable  6. Borderline diabetes Stable  7. Primary osteoarthritis involving multiple joints Stable  8. HLD (hyperlipidemia) Stable  9. Elevated blood sugar Stable

## 2015-01-03 ENCOUNTER — Telehealth: Payer: Self-pay | Admitting: Emergency Medicine

## 2015-01-03 LAB — COMPREHENSIVE METABOLIC PANEL
ALK PHOS: 119 IU/L — AB (ref 39–117)
ALT: 11 IU/L (ref 0–32)
AST: 26 IU/L (ref 0–40)
Albumin/Globulin Ratio: 1 — ABNORMAL LOW (ref 1.1–2.5)
Albumin: 4 g/dL (ref 3.6–4.8)
BILIRUBIN TOTAL: 0.4 mg/dL (ref 0.0–1.2)
BUN/Creatinine Ratio: 19 (ref 11–26)
BUN: 18 mg/dL (ref 8–27)
CHLORIDE: 100 mmol/L (ref 97–106)
CO2: 28 mmol/L (ref 18–29)
CREATININE: 0.93 mg/dL (ref 0.57–1.00)
Calcium: 10.5 mg/dL — ABNORMAL HIGH (ref 8.7–10.3)
GFR calc Af Amer: 73 mL/min/{1.73_m2} (ref >59)
GFR calc non Af Amer: 63 mL/min/{1.73_m2} (ref >59)
GLUCOSE: 106 mg/dL — AB (ref 65–99)
Globulin, Total: 3.9 g/dL (ref 1.5–4.5)
Potassium: 4.5 mmol/L (ref 3.5–5.2)
Sodium: 141 mmol/L (ref 136–144)
Total Protein: 7.9 g/dL (ref 6.0–8.5)

## 2015-01-03 NOTE — Telephone Encounter (Signed)
Voice mail not working. Sent letter

## 2015-01-06 ENCOUNTER — Ambulatory Visit: Payer: Medicare Other | Admitting: Family Medicine

## 2015-01-10 ENCOUNTER — Ambulatory Visit (INDEPENDENT_AMBULATORY_CARE_PROVIDER_SITE_OTHER): Payer: Medicare Other | Admitting: Family Medicine

## 2015-01-10 ENCOUNTER — Encounter: Payer: Self-pay | Admitting: Family Medicine

## 2015-01-10 VITALS — BP 132/74 | HR 81 | Temp 97.9°F | Resp 16 | Ht 64.0 in | Wt 303.0 lb

## 2015-01-10 DIAGNOSIS — R42 Dizziness and giddiness: Secondary | ICD-10-CM

## 2015-01-10 DIAGNOSIS — J3089 Other allergic rhinitis: Secondary | ICD-10-CM | POA: Insufficient documentation

## 2015-01-10 DIAGNOSIS — E860 Dehydration: Secondary | ICD-10-CM | POA: Diagnosis not present

## 2015-01-10 DIAGNOSIS — H9313 Tinnitus, bilateral: Secondary | ICD-10-CM | POA: Diagnosis not present

## 2015-01-10 MED ORDER — PREDNISONE 20 MG PO TABS
20.0000 mg | ORAL_TABLET | Freq: Every day | ORAL | Status: DC
Start: 2015-01-10 — End: 2015-07-01

## 2015-01-10 MED ORDER — MECLIZINE HCL 25 MG PO TABS: 25.0000 mg | ORAL_TABLET | Freq: Three times a day (TID) | ORAL | Status: DC | PRN

## 2015-01-10 NOTE — Progress Notes (Addendum)
Name: Colleen [name]   MRN: [id number]    DOB: 10/10/1945   Date:01/10/2015       Progress Note  Subjective  Chief Complaint  Chief Complaint  Patient presents with  . Dizziness    light headed, ringing in ears, dizziness    HPI  Dizziness  Patient presents with a ten-day history of intermittent dizziness described as a rotating sensation particularly when she leans forward. She is also experiencing some tinnitus. No hearing difficulty. There is no nausea no vomiting there's been no palpitations and no visual disturbance.  Follow-up of dehydration  Patient isn't drinking up to 7 glasses of water per day. She has regained her strength. Her dizziness which started about a week or so ago had was not a part of the dehydration syndrome and she is experiencing.  Allergic rhinitis  Patient has a long-standing history of allergic rhinitis. She has not been taking her Zyrtec and Rhinocort on a regular basis of recent.  Past Medical History  Diagnosis Date  . Hyperlipidemia   . Hypertension   . Gout   . Asthma   . Osteoarthrosis   . COPD (chronic obstructive pulmonary disease) ([place])     Social History  Substance Use Topics  . Smoking status: Former Smoker    Quit date: 11/13/2013  . Smokeless tobacco: Not on file  . Alcohol Use: 0.0 oz/week    0 Standard drinks or equivalent per week     Comment: social      Current outpatient prescriptions:  .  albuterol (PROVENTIL HFA;VENTOLIN HFA) 108 (90 BASE) MCG/ACT inhaler, Inhale 1 puff into the lungs every 6 (six) hours as needed for wheezing or shortness of breath., Disp: 1 Inhaler, Rfl: 5 .  allopurinol (ZYLOPRIM) 100 MG tablet, Take 1 tablet (100 mg total) by mouth daily., Disp: 30 tablet, Rfl: 5 .  aspirin EC 81 MG tablet, Take 81 mg by mouth daily., Disp: , Rfl:  .  budesonide-formoterol (SYMBICORT) 160-4.5 MCG/ACT inhaler, Inhale 2 puffs into the lungs 2 (two) times daily., Disp: 1 Inhaler, Rfl: 5 .  enalapril (VASOTEC) 20 MG  tablet, Take 1 tablet (20 mg total) by mouth daily., Disp: 30 tablet, Rfl: 5 .  hydrochlorothiazide (MICROZIDE) 12.5 MG capsule, Take 1 capsule (12.5 mg total) by mouth daily., Disp: 30 capsule, Rfl: 5 .  lovastatin (MEVACOR) 40 MG tablet, Take 40 mg by mouth at bedtime., Disp: , Rfl:  .  naproxen (NAPROSYN) 500 MG tablet, Take 500 mg by mouth 2 (two) times daily., Disp: , Rfl:   Current facility-administered medications:  .  albuterol (PROVENTIL) (2.5 MG/3ML) 0.083% nebulizer solution 2.5 mg, 2.5 mg, Nebulization, Once, Ashok Norris, MD  No Known Allergies  Review of Systems  Constitutional: Negative for fever, chills and weight loss.  HENT: Positive for congestion and tinnitus. Negative for hearing loss and sore throat.   Eyes: Negative for blurred vision, double vision and redness.  Respiratory: Negative for cough, hemoptysis and shortness of breath.   Cardiovascular: Negative for chest pain, palpitations, orthopnea, claudication and leg swelling.  Gastrointestinal: Negative for heartburn, nausea, vomiting, diarrhea, constipation and blood in stool.  Genitourinary: Negative for dysuria, urgency, frequency and hematuria.  Musculoskeletal: Negative for myalgias, back pain, joint pain, falls and neck pain.  Skin: Negative for itching.  Neurological: Positive for dizziness. Negative for tingling, tremors, focal weakness, seizures, loss of consciousness, weakness and headaches.  Endo/Heme/Allergies: Does not bruise/bleed easily.  Psychiatric/Behavioral: Negative for depression and substance abuse. The patient  is not nervous/anxious and does not have insomnia.      Objective  Filed Vitals:   01/10/15 0747  BP: 132/74  Pulse: 81  Temp: 97.9 F (36.6 C)  TempSrc: Oral  Resp: 16  Height: 5\' 4"  (1.626 m)  Weight: 303 lb (137.44 kg)  SpO2: 97%     Physical Exam  Constitutional: She is oriented to person, place, and time and well-developed, well-nourished, and in no distress.   Morbidly obese and in no acute distress  HENT:  Head: Normocephalic.  Nasal turbinate erythema with some crusty discharge Ears there is still some slight fluid bilaterally.  Eyes: EOM are normal. Pupils are equal, round, and reactive to light.  Neck: Normal range of motion. No thyromegaly present.  Cardiovascular: Normal rate, regular rhythm and normal heart sounds.   No murmur heard. Pulmonary/Chest: Effort normal and breath sounds normal.  Abdominal: Soft. Bowel sounds are normal.  Musculoskeletal: Normal range of motion. She exhibits no edema.  Neurological: She is alert and oriented to person, place, and time. No cranial nerve deficit. Gait normal.  Finger to nose is normal  Skin: Skin is warm and dry. No rash noted.  Psychiatric: Memory and affect normal.      Assessment & Plan  1. Dizziness   - meclizine (ANTIVERT) 25 MG tablet; Take 1 tablet (25 mg total) by mouth 3 (three) times daily as needed for dizziness.  Dispense: 30 tablet; Refill: 0 - predniSONE (DELTASONE) 20 MG tablet; Take 1 tablet (20 mg total) by mouth daily with breakfast.  Dispense: 10 tablet; Refill: 0 - Comprehensive Metabolic Panel (CMET)  2. Tinnitus, bilateral  - predniSONE (DELTASONE) 20 MG tablet; Take 1 tablet (20 mg total) by mouth daily with breakfast.  Dispense: 10 tablet; Refill: 0  3. Other allergic rhinitis Resume Flonase  4. Dehydration Recheck metabolic panel and continue oral rehydration - Comprehensive Metabolic Panel (CMET)

## 2015-01-10 NOTE — Addendum Note (Signed)
Addended by: Syla Devoss G on: 01/10/2015 08:22 AM   Modules accepted: Orders

## 2015-01-10 NOTE — Patient Instructions (Signed)

## 2015-01-11 LAB — COMPREHENSIVE METABOLIC PANEL
ALT: 11 IU/L (ref 0–32)
AST: 16 IU/L (ref 0–40)
Albumin/Globulin Ratio: 0.9 — ABNORMAL LOW (ref 1.1–2.5)
Albumin: 3.7 g/dL (ref 3.6–4.8)
Alkaline Phosphatase: 116 IU/L (ref 39–117)
BUN / CREAT RATIO: 20 (ref 11–26)
BUN: 20 mg/dL (ref 8–27)
Bilirubin Total: 0.4 mg/dL (ref 0.0–1.2)
CALCIUM: 10.2 mg/dL (ref 8.7–10.3)
CO2: 26 mmol/L (ref 18–29)
CREATININE: 1.02 mg/dL — AB (ref 0.57–1.00)
Chloride: 100 mmol/L (ref 96–106)
GFR, EST AFRICAN AMERICAN: 65 mL/min/{1.73_m2} (ref >59)
GFR, EST NON AFRICAN AMERICAN: 57 mL/min/{1.73_m2} — AB (ref >59)
GLOBULIN, TOTAL: 3.9 g/dL (ref 1.5–4.5)
Glucose: 104 mg/dL — ABNORMAL HIGH (ref 65–99)
Potassium: 4.4 mmol/L (ref 3.5–5.2)
SODIUM: 139 mmol/L (ref 134–144)
TOTAL PROTEIN: 7.6 g/dL (ref 6.0–8.5)

## 2015-01-16 ENCOUNTER — Ambulatory Visit: Payer: Medicare Other | Admitting: Family Medicine

## 2015-01-26 ENCOUNTER — Other Ambulatory Visit: Payer: Self-pay | Admitting: Family Medicine

## 2015-02-14 ENCOUNTER — Other Ambulatory Visit: Payer: Self-pay | Admitting: Family Medicine

## 2015-02-25 ENCOUNTER — Other Ambulatory Visit: Payer: Self-pay | Admitting: Family Medicine

## 2015-03-25 ENCOUNTER — Other Ambulatory Visit: Payer: Self-pay | Admitting: Family Medicine

## 2015-03-26 ENCOUNTER — Other Ambulatory Visit: Payer: Self-pay | Admitting: Family Medicine

## 2015-04-02 ENCOUNTER — Ambulatory Visit: Payer: Medicare Other | Admitting: Family Medicine

## 2015-04-24 ENCOUNTER — Other Ambulatory Visit: Payer: Self-pay | Admitting: Family Medicine

## 2015-05-03 ENCOUNTER — Other Ambulatory Visit: Payer: Self-pay | Admitting: Family Medicine

## 2015-05-08 ENCOUNTER — Ambulatory Visit: Payer: Medicare Other | Admitting: Family Medicine

## 2015-05-26 ENCOUNTER — Other Ambulatory Visit: Payer: Self-pay | Admitting: Family Medicine

## 2015-06-25 ENCOUNTER — Other Ambulatory Visit: Payer: Self-pay | Admitting: Family Medicine

## 2015-07-01 ENCOUNTER — Ambulatory Visit (INDEPENDENT_AMBULATORY_CARE_PROVIDER_SITE_OTHER): Payer: PPO | Admitting: Family Medicine

## 2015-07-01 ENCOUNTER — Encounter: Payer: Self-pay | Admitting: Family Medicine

## 2015-07-01 VITALS — BP 124/68 | HR 132 | Temp 97.8°F | Resp 20 | Ht 64.0 in | Wt 297.2 lb

## 2015-07-01 DIAGNOSIS — J3089 Other allergic rhinitis: Secondary | ICD-10-CM

## 2015-07-01 DIAGNOSIS — M1000 Idiopathic gout, unspecified site: Secondary | ICD-10-CM | POA: Diagnosis not present

## 2015-07-01 DIAGNOSIS — M150 Primary generalized (osteo)arthritis: Secondary | ICD-10-CM

## 2015-07-01 DIAGNOSIS — J449 Chronic obstructive pulmonary disease, unspecified: Secondary | ICD-10-CM | POA: Diagnosis not present

## 2015-07-01 DIAGNOSIS — R7303 Prediabetes: Secondary | ICD-10-CM

## 2015-07-01 DIAGNOSIS — I10 Essential (primary) hypertension: Secondary | ICD-10-CM | POA: Diagnosis not present

## 2015-07-01 DIAGNOSIS — E785 Hyperlipidemia, unspecified: Secondary | ICD-10-CM

## 2015-07-01 DIAGNOSIS — M159 Polyosteoarthritis, unspecified: Secondary | ICD-10-CM

## 2015-07-01 MED ORDER — ENALAPRIL MALEATE 20 MG PO TABS: 20.0000 mg | ORAL_TABLET | Freq: Every day | ORAL | Status: DC

## 2015-07-01 MED ORDER — HYDROCHLOROTHIAZIDE 12.5 MG PO CAPS: 12.5000 mg | ORAL_CAPSULE | Freq: Every day | ORAL | Status: DC

## 2015-07-01 MED ORDER — LOVASTATIN 40 MG PO TABS: 40.0000 mg | ORAL_TABLET | Freq: Every day | ORAL | Status: DC

## 2015-07-01 MED ORDER — FLUTICASONE PROPIONATE 50 MCG/ACT NA SUSP: 2.0000 | Freq: Every day | NASAL | Status: DC

## 2015-07-01 MED ORDER — SPACER/AERO-HOLDING CHAMBERS DEVI: 1.0000 [IU] | Freq: Two times a day (BID) | Status: DC | PRN

## 2015-07-01 MED ORDER — ALLOPURINOL 100 MG PO TABS: 100.0000 mg | ORAL_TABLET | Freq: Every day | ORAL | Status: DC

## 2015-07-01 NOTE — Progress Notes (Signed)
Date:  07/01/2015   Name:  Colleen [name]   DOB:  Aug 01, 1945   MRN:  [id number]  PCP:  Ashok Norris, MD    Chief Complaint: Hypertension   History of Present Illness:  This is a 70 y.o. female request refills of her chronic meds. C/o throat irritation after using Symbicort so using only prn. C/o chronic nasal congestion, Flonase has helped in past. Last blood work in December showed mild CKD and hyperglycemia. Using Naprosyn bid for her arthritis pain.  Review of Systems:  Review of Systems  Constitutional: Negative for fever and fatigue.  Respiratory: Negative for cough and choking.   Cardiovascular: Negative for chest pain and leg swelling.  Endocrine: Negative for polyuria.  Genitourinary: Negative for difficulty urinating.  Neurological: Negative for syncope and light-headedness.    Patient Active Problem List   Diagnosis Date Noted  . Other allergic rhinitis 01/10/2015  . Allergic rhinitis, seasonal 07/30/2014  . Airway hyperreactivity 07/30/2014  . Elevated blood sugar 07/30/2014  . Gout 07/30/2014  . HLD (hyperlipidemia) 07/30/2014  . Encounter for immunization 07/30/2014  . Arthritis, degenerative 07/30/2014  . Borderline diabetes 07/30/2014  . Asthma, mild persistent 07/30/2014  . COPD (chronic obstructive pulmonary disease) ([place]) 07/09/2008  . Benign essential HTN 06/22/2006    Prior to Admission medications   Medication Sig Start Date End Date Taking? Authorizing Provider  albuterol (PROVENTIL HFA;VENTOLIN HFA) 108 (90 BASE) MCG/ACT inhaler Inhale 1 puff into the lungs every 6 (six) hours as needed for wheezing or shortness of breath. 11/08/14   Ashok Norris, MD  allopurinol (ZYLOPRIM) 100 MG tablet Take 1 tablet (100 mg total) by mouth daily. 07/01/15   Adline Potter, MD  aspirin EC 81 MG tablet Take 81 mg by mouth daily.    Historical Provider, MD  budesonide-formoterol (SYMBICORT) 160-4.5 MCG/ACT inhaler Inhale 2 puffs into the lungs 2 (two) times daily.  07/30/14   Ashok Norris, MD  enalapril (VASOTEC) 20 MG tablet Take 1 tablet (20 mg total) by mouth daily. 07/01/15   Adline Potter, MD  fluticasone (FLONASE) 50 MCG/ACT nasal spray Place 2 sprays into both nostrils daily. 07/01/15   Adline Potter, MD  hydrochlorothiazide (MICROZIDE) 12.5 MG capsule Take 1 capsule (12.5 mg total) by mouth daily. 07/01/15   Adline Potter, MD  lovastatin (MEVACOR) 40 MG tablet Take 1 tablet (40 mg total) by mouth at bedtime. 07/01/15   Adline Potter, MD  meclizine (ANTIVERT) 25 MG tablet TAKE ONE TABLET BY MOUTH THREE TIMES DAILY AS NEEDED FOR DIZZINESS 05/05/15   Ashok Norris, MD  naproxen (NAPROSYN) 500 MG tablet TAKE ONE TABLET BY MOUTH TWICE DAILY 05/26/15   Ashok Norris, MD  Spacer/Aero-Holding Chambers DEVI 1 Units by Does not apply route 2 (two) times daily as needed. 07/01/15   Adline Potter, MD  triamcinolone cream (KENALOG) 0.1 % APPLY CREAM EXTERNALLY TO AFFECTED AREA TWICE DAILY 04/24/15   Ashok Norris, MD    No Known Allergies  Past Surgical History  Procedure Laterality Date  . Lumbar laminectomy      Social History  Substance Use Topics  . Smoking status: Former Smoker    Quit date: 11/13/2013  . Smokeless tobacco: None  . Alcohol Use: 0.0 oz/week    0 Standard drinks or equivalent per week     Comment: social     Family History  Problem Relation Age of Onset  . Diabetes Mother     Medication list has been reviewed and updated.  Physical Examination:  BP 124/68 mmHg  Pulse 132  Temp(Src) 97.8 F (36.6 C)  Resp 20  Ht 5\' 4"  (1.626 m)  Wt 297 lb 4 oz (134.832 kg)  BMI 51.00 kg/m2  SpO2 96%  Physical Exam  Constitutional: She appears well-developed and well-nourished.  Cardiovascular: Normal rate, regular rhythm and normal heart sounds.   Pulmonary/Chest: Effort normal and breath sounds normal.  Musculoskeletal: She exhibits no edema.  Neurological: She is alert.  Skin: Skin is warm and dry.  Psychiatric: She has a normal mood  and affect. Her behavior is normal.  Nursing note and vitals reviewed.   Assessment and Plan:  1. Benign essential HTN Well controlled on enalapril/HCTZ, refill - Comprehensive Metabolic Panel (CMET)  2. Borderline diabetes Last a1c 6.0% in November - HgB A1c  3. Idiopathic gout, unspecified chronicity, unspecified site Well controlled on allopurinol, refill - Uric acid  4. Chronic obstructive pulmonary disease, unspecified COPD type ([place]) Marginal control on Symbicort, use bid with spacer to avoid throat irritation  5. HLD (hyperlipidemia) Well controlled on lovastatin, refill  6. Other allergic rhinitis Trial Flonase  7. OA Recommend d/c Naprosyn given age, use Tylenol instead  Return in about 6 months (around 12/31/2015).  Satira Anis. [facility]  07/01/2015

## 2015-07-02 LAB — COMPREHENSIVE METABOLIC PANEL
ALBUMIN: 3.9 g/dL (ref 3.6–4.8)
ALK PHOS: 124 IU/L — AB (ref 39–117)
ALT: 10 IU/L (ref 0–32)
AST: 16 IU/L (ref 0–40)
Albumin/Globulin Ratio: 1 — ABNORMAL LOW (ref 1.2–2.2)
BUN / CREAT RATIO: 21 (ref 12–28)
BUN: 22 mg/dL (ref 8–27)
Bilirubin Total: 0.4 mg/dL (ref 0.0–1.2)
CALCIUM: 10.1 mg/dL (ref 8.7–10.3)
CO2: 25 mmol/L (ref 18–29)
CREATININE: 1.07 mg/dL — AB (ref 0.57–1.00)
Chloride: 95 mmol/L — ABNORMAL LOW (ref 96–106)
GFR, EST AFRICAN AMERICAN: 61 mL/min/{1.73_m2} (ref >59)
GFR, EST NON AFRICAN AMERICAN: 53 mL/min/{1.73_m2} — AB (ref >59)
GLOBULIN, TOTAL: 4.1 g/dL (ref 1.5–4.5)
Glucose: 111 mg/dL — ABNORMAL HIGH (ref 65–99)
Potassium: 4.5 mmol/L (ref 3.5–5.2)
SODIUM: 136 mmol/L (ref 134–144)
TOTAL PROTEIN: 8 g/dL (ref 6.0–8.5)

## 2015-07-02 LAB — URIC ACID: Uric Acid: 6.9 mg/dL (ref 2.5–7.1)

## 2015-07-02 LAB — HEMOGLOBIN A1C
Est. average glucose Bld gHb Est-mCnc: 123 mg/dL
Hgb A1c MFr Bld: 5.9 % — ABNORMAL HIGH (ref 4.8–5.6)

## 2015-07-24 ENCOUNTER — Other Ambulatory Visit: Payer: Self-pay | Admitting: Family Medicine

## 2015-07-31 MED ORDER — DICLOFENAC SODIUM 1 % TD GEL: 4.0000 g | Freq: Four times a day (QID) | TRANSDERMAL | Status: DC | PRN

## 2015-07-31 NOTE — Telephone Encounter (Signed)
I reviewed Colleen [name] last labs and Dr. Chinita Greenland note I agree that she should stop the oral naproxen I've sent in a new prescription for Colleen [name] for a topical NSAID; it has the same type of medicine that the naproxen has but it goes through the skin directly to the knees, so less is absorbed in the bloodstream; I think this may be a good option for Colleen [name] I look forward to meeting Colleen [name] soon

## 2015-07-31 NOTE — Telephone Encounter (Signed)
Patient was seen by Dr Vicente Masson on 07-01-15 and did not refill her naproxen prescription. I did schedule her an appointment with you for 08-19-15. She is asking that you please give her enough to last until her appointment. States that her knees are really bothering her.

## 2015-08-01 NOTE — Telephone Encounter (Signed)
Patient stated that she do no like using the cram and Naproxen is the only thing that works for her knee pain. I advised patient to make an appointment.

## 2015-08-06 DIAGNOSIS — M170 Bilateral primary osteoarthritis of knee: Secondary | ICD-10-CM | POA: Diagnosis not present

## 2015-08-19 ENCOUNTER — Ambulatory Visit: Payer: PPO | Admitting: Family Medicine

## 2015-09-10 DIAGNOSIS — M170 Bilateral primary osteoarthritis of knee: Secondary | ICD-10-CM | POA: Diagnosis not present

## 2015-10-23 LAB — COLOGUARD: Cologuard: NEGATIVE

## 2015-12-26 ENCOUNTER — Other Ambulatory Visit: Payer: Self-pay | Admitting: Family Medicine

## 2015-12-26 DIAGNOSIS — J4530 Mild persistent asthma, uncomplicated: Secondary | ICD-10-CM

## 2015-12-31 ENCOUNTER — Ambulatory Visit (INDEPENDENT_AMBULATORY_CARE_PROVIDER_SITE_OTHER): Payer: PPO | Admitting: Family Medicine

## 2015-12-31 ENCOUNTER — Encounter: Payer: Self-pay | Admitting: Family Medicine

## 2015-12-31 VITALS — BP 128/76 | HR 84 | Temp 97.9°F | Resp 18 | Ht 64.0 in | Wt 296.1 lb

## 2015-12-31 DIAGNOSIS — I10 Essential (primary) hypertension: Secondary | ICD-10-CM | POA: Diagnosis not present

## 2015-12-31 DIAGNOSIS — J449 Chronic obstructive pulmonary disease, unspecified: Secondary | ICD-10-CM

## 2015-12-31 DIAGNOSIS — E785 Hyperlipidemia, unspecified: Secondary | ICD-10-CM

## 2015-12-31 LAB — COMPLETE METABOLIC PANEL WITH GFR
ALT: 10 U/L (ref 6–29)
AST: 20 U/L (ref 10–35)
Albumin: 3.7 g/dL (ref 3.6–5.1)
Alkaline Phosphatase: 102 U/L (ref 33–130)
BUN: 20 mg/dL (ref 7–25)
CHLORIDE: 101 mmol/L (ref 98–110)
CO2: 28 mmol/L (ref 20–31)
CREATININE: 0.97 mg/dL (ref 0.50–0.99)
Calcium: 10 mg/dL (ref 8.6–10.4)
GFR, EST AFRICAN AMERICAN: 69 mL/min (ref >=60)
GFR, Est Non African American: 60 mL/min (ref >=60)
GLUCOSE: 97 mg/dL (ref 65–99)
Potassium: 4.4 mmol/L (ref 3.5–5.3)
SODIUM: 138 mmol/L (ref 135–146)
Total Bilirubin: 0.5 mg/dL (ref 0.2–1.2)
Total Protein: 7.8 g/dL (ref 6.1–8.1)

## 2015-12-31 LAB — LIPID PANEL
Cholesterol: 149 mg/dL (ref <200)
HDL: 70 mg/dL (ref >50)
LDL CALC: 64 mg/dL (ref <100)
TRIGLYCERIDES: 75 mg/dL (ref <150)
Total CHOL/HDL Ratio: 2.1 Ratio (ref <5.0)
VLDL: 15 mg/dL (ref <30)

## 2015-12-31 MED ORDER — ALBUTEROL SULFATE HFA 108 (90 BASE) MCG/ACT IN AERS: 1.0000 | INHALATION_SPRAY | Freq: Four times a day (QID) | RESPIRATORY_TRACT | 5 refills | Status: DC | PRN

## 2015-12-31 MED ORDER — BUDESONIDE-FORMOTEROL FUMARATE 160-4.5 MCG/ACT IN AERO: 2.0000 | INHALATION_SPRAY | Freq: Two times a day (BID) | RESPIRATORY_TRACT | 5 refills | Status: DC

## 2015-12-31 NOTE — Progress Notes (Signed)
Name: Colleen [name]   MRN: [id number]    DOB: 1945/04/14   Date:12/31/2015       Progress Note  Subjective  Chief Complaint  Chief Complaint  Patient presents with  . Follow-up    3 month recheck  . Hypertension  . Asthma    medication refill    Hypertension  This is a chronic problem. The problem is unchanged. The problem is controlled. Pertinent negatives include no blurred vision or palpitations. Past treatments include ACE inhibitors and diuretics. Hypertensive end-organ damage includes kidney disease. There is no history of CVA.  Hyperlipidemia  This is a chronic problem. The problem is controlled. Recent lipid tests were reviewed and are normal. Pertinent negatives include no leg pain. Current antihyperlipidemic treatment includes statins.   COPD; Has history of COPD main symptoms are dyspnea, no orthopnea, no chest tightness or coughing. She is on Albuterol and Symbicort. She has quit smoking since 2010.  Past Medical History:  Diagnosis Date  . Asthma   . COPD (chronic obstructive pulmonary disease) ([place])   . Gout   . Hyperlipidemia   . Hypertension   . Osteoarthrosis     Past Surgical History:  Procedure Laterality Date  . LUMBAR LAMINECTOMY      Family History  Problem Relation Age of Onset  . Diabetes Mother     Social History   Social History  . Marital status: Married    Spouse name: N/A  . Number of children: N/A  . Years of education: N/A   Occupational History  . Not on file.   Social History Main Topics  . Smoking status: Former Smoker    Quit date: 11/13/2013  . Smokeless tobacco: Not on file  . Alcohol use 0.0 oz/week     Comment: social   . Drug use: Unknown  . Sexual activity: Not Currently   Other Topics Concern  . Not on file   Social History Narrative  . No narrative on file     Current Outpatient Prescriptions:  .  albuterol (PROVENTIL HFA;VENTOLIN HFA) 108 (90 BASE) MCG/ACT inhaler, Inhale 1 puff into the lungs every 6  (six) hours as needed for wheezing or shortness of breath., Disp: 1 Inhaler, Rfl: 5 .  allopurinol (ZYLOPRIM) 100 MG tablet, Take 1 tablet (100 mg total) by mouth daily., Disp: 90 tablet, Rfl: 3 .  aspirin EC 81 MG tablet, Take 81 mg by mouth daily., Disp: , Rfl:  .  budesonide-formoterol (SYMBICORT) 160-4.5 MCG/ACT inhaler, Inhale 2 puffs into the lungs 2 (two) times daily., Disp: 1 Inhaler, Rfl: 5 .  diclofenac sodium (VOLTAREN) 1 % GEL, Apply 4 g topically 4 (four) times daily as needed. 4 grams on each knee 4 x a day if needed for pain, Disp: 100 g, Rfl: 2 .  enalapril (VASOTEC) 20 MG tablet, Take 1 tablet (20 mg total) by mouth daily., Disp: 90 tablet, Rfl: 3 .  fluticasone (FLONASE) 50 MCG/ACT nasal spray, Place 2 sprays into both nostrils daily., Disp: 16 g, Rfl: 2 .  hydrochlorothiazide (MICROZIDE) 12.5 MG capsule, Take 1 capsule (12.5 mg total) by mouth daily., Disp: 90 capsule, Rfl: 3 .  lovastatin (MEVACOR) 40 MG tablet, Take 1 tablet (40 mg total) by mouth at bedtime., Disp: 90 tablet, Rfl: 3 .  meclizine (ANTIVERT) 25 MG tablet, TAKE ONE TABLET BY MOUTH THREE TIMES DAILY AS NEEDED FOR DIZZINESS, Disp: 30 tablet, Rfl: 0 .  Spacer/Aero-Holding Chambers DEVI, 1 Units by Does not apply route  2 (two) times daily as needed., Disp: 1 each, Rfl: 2 .  triamcinolone cream (KENALOG) 0.1 %, APPLY CREAM EXTERNALLY TO AFFECTED AREA TWICE DAILY, Disp: 80 g, Rfl: 1  Current Facility-Administered Medications:  .  albuterol (PROVENTIL) (2.5 MG/3ML) 0.083% nebulizer solution 2.5 mg, 2.5 mg, Nebulization, Once, Ashok Norris, MD  No Known Allergies   Review of Systems  Eyes: Negative for blurred vision.  Cardiovascular: Negative for palpitations.     Objective  Vitals:   12/31/15 0837  BP: 128/76  Pulse: 84  Resp: 18  Temp: 97.9 F (36.6 C)  TempSrc: Oral  SpO2: 96%  Weight: 296 lb 1.6 oz (134.3 kg)  Height: 5\' 4"  (1.626 m)    Physical Exam  Constitutional: She is oriented to  person, place, and time and well-developed, well-nourished, and in no distress.  HENT:  Head: Normocephalic and atraumatic.  Cardiovascular: Normal rate, regular rhythm and normal heart sounds.   No murmur heard. Pulmonary/Chest: Effort normal and breath sounds normal. She has no wheezes.  Abdominal: Soft. Bowel sounds are normal. There is no tenderness.  Neurological: She is alert and oriented to person, place, and time.  Psychiatric: Mood, memory, affect and judgment normal.  Nursing note and vitals reviewed.   Assessment & Plan  1. Chronic obstructive pulmonary disease, unspecified COPD type ([place]) Continue on ICS-LABA and rescue inhaler. - budesonide-formoterol (SYMBICORT) 160-4.5 MCG/ACT inhaler; Inhale 2 puffs into the lungs 2 (two) times daily.  Dispense: 1 Inhaler; Refill: 5 - albuterol (PROVENTIL HFA;VENTOLIN HFA) 108 (90 Base) MCG/ACT inhaler; Inhale 1 puff into the lungs every 6 (six) hours as needed for wheezing or shortness of breath.  Dispense: 1 Inhaler; Refill: 5  2. Benign essential HTN BP stable on present antihypertensive therapy  3. Hyperlipidemia, unspecified hyperlipidemia type  - Lipid Profile - COMPLETE METABOLIC PANEL WITH GFR   Faiza Bansal Asad A. [facility] 12/31/2015 8:58 AM

## 2016-01-08 ENCOUNTER — Other Ambulatory Visit: Payer: Self-pay | Admitting: Emergency Medicine

## 2016-01-08 ENCOUNTER — Telehealth: Payer: Self-pay | Admitting: Family Medicine

## 2016-01-08 MED ORDER — NAPROXEN 500 MG PO TABS: 500.0000 mg | ORAL_TABLET | Freq: Every day | ORAL | 0 refills | Status: DC

## 2016-01-08 NOTE — Telephone Encounter (Signed)
Last seen on 12/31/15. States that the pharmacy did not get her refill on Naproxen. Uses [facility]

## 2016-01-08 NOTE — Telephone Encounter (Signed)
done

## 2016-05-27 ENCOUNTER — Telehealth: Payer: Self-pay | Admitting: Family Medicine

## 2016-05-27 NOTE — Telephone Encounter (Signed)
I have never provided her refills on triamcinolone cream and naproxen is not even on her list of active and current medications. She will need an appointment for both med refills. We schedule for an appointment on Friday, 05/28/2016 at 2:40 PM

## 2016-05-27 NOTE — Telephone Encounter (Signed)
Pt has her 38m follow up visit scheduled for June. However she is needing a refill on triamcinolone cream and naproxen. Please send to [facility].  [phone]

## 2016-05-28 ENCOUNTER — Other Ambulatory Visit: Payer: Self-pay | Admitting: Emergency Medicine

## 2016-05-28 MED ORDER — NAPROXEN 500 MG PO TABS: 500.0000 mg | ORAL_TABLET | Freq: Every day | ORAL | 0 refills | Status: DC

## 2016-05-28 MED ORDER — TRIAMCINOLONE ACETONIDE 0.1 % EX CREA: TOPICAL_CREAM | CUTANEOUS | 0 refills | Status: DC

## 2016-05-28 NOTE — Telephone Encounter (Signed)
Both of these medications are on patient list and up to date

## 2016-06-04 ENCOUNTER — Emergency Department
Admission: EM | Admit: 2016-06-04 | Discharge: 2016-06-04 | Disposition: A | Discharge status: Home / Self Care | Payer: Medicare HMO | Attending: Emergency Medicine | Admitting: Emergency Medicine

## 2016-06-04 ENCOUNTER — Emergency Department: Payer: Medicare HMO

## 2016-06-04 ENCOUNTER — Encounter: Payer: Self-pay | Admitting: *Deleted

## 2016-06-04 DIAGNOSIS — R141 Gas pain: Secondary | ICD-10-CM | POA: Diagnosis not present

## 2016-06-04 DIAGNOSIS — J45909 Unspecified asthma, uncomplicated: Secondary | ICD-10-CM | POA: Diagnosis not present

## 2016-06-04 DIAGNOSIS — Z79899 Other long term (current) drug therapy: Secondary | ICD-10-CM | POA: Diagnosis not present

## 2016-06-04 DIAGNOSIS — Z7982 Long term (current) use of aspirin: Secondary | ICD-10-CM | POA: Diagnosis not present

## 2016-06-04 DIAGNOSIS — Z87891 Personal history of nicotine dependence: Secondary | ICD-10-CM | POA: Insufficient documentation

## 2016-06-04 DIAGNOSIS — I10 Essential (primary) hypertension: Secondary | ICD-10-CM | POA: Diagnosis not present

## 2016-06-04 DIAGNOSIS — R0600 Dyspnea, unspecified: Secondary | ICD-10-CM | POA: Diagnosis not present

## 2016-06-04 DIAGNOSIS — J449 Chronic obstructive pulmonary disease, unspecified: Secondary | ICD-10-CM | POA: Insufficient documentation

## 2016-06-04 DIAGNOSIS — Z791 Long term (current) use of non-steroidal anti-inflammatories (NSAID): Secondary | ICD-10-CM | POA: Diagnosis not present

## 2016-06-04 DIAGNOSIS — R0602 Shortness of breath: Secondary | ICD-10-CM | POA: Diagnosis present

## 2016-06-04 DIAGNOSIS — R1013 Epigastric pain: Secondary | ICD-10-CM

## 2016-06-04 LAB — BASIC METABOLIC PANEL
Anion gap: 9 (ref 5–15)
BUN: 24 mg/dL — ABNORMAL HIGH (ref 6–20)
CALCIUM: 9.4 mg/dL (ref 8.9–10.3)
CO2: 25 mmol/L (ref 22–32)
CREATININE: 1.03 mg/dL — AB (ref 0.44–1.00)
Chloride: 100 mmol/L — ABNORMAL LOW (ref 101–111)
GFR, EST NON AFRICAN AMERICAN: 54 mL/min — AB (ref >60)
Glucose, Bld: 97 mg/dL (ref 65–99)
Potassium: 3.9 mmol/L (ref 3.5–5.1)
SODIUM: 134 mmol/L — AB (ref 135–145)

## 2016-06-04 LAB — HEPATIC FUNCTION PANEL
ALT: 12 U/L — ABNORMAL LOW (ref 14–54)
AST: 19 U/L (ref 15–41)
Albumin: 3.6 g/dL (ref 3.5–5.0)
Alkaline Phosphatase: 101 U/L (ref 38–126)
TOTAL PROTEIN: 8.4 g/dL — AB (ref 6.5–8.1)
Total Bilirubin: 0.6 mg/dL (ref 0.3–1.2)

## 2016-06-04 LAB — CBC
HCT: 39.7 % (ref 35.0–47.0)
Hemoglobin: 13.1 g/dL (ref 12.0–16.0)
MCH: 30.1 pg (ref 26.0–34.0)
MCHC: 33 g/dL (ref 32.0–36.0)
MCV: 91.2 fL (ref 80.0–100.0)
Platelets: 289 10*3/uL (ref 150–440)
RBC: 4.35 MIL/uL (ref 3.80–5.20)
RDW: 13.9 % (ref 11.5–14.5)
WBC: 9.9 10*3/uL (ref 3.6–11.0)

## 2016-06-04 LAB — TROPONIN I: Troponin I: <0.03 ng/mL (ref <0.03)

## 2016-06-04 LAB — LIPASE, BLOOD: LIPASE: 23 U/L (ref 11–51)

## 2016-06-04 MED ORDER — SIMETHICONE 80 MG PO CHEW: 80.0000 mg | CHEWABLE_TABLET | Freq: Four times a day (QID) | ORAL | 2 refills | Status: DC | PRN

## 2016-06-04 MED ORDER — ALBUTEROL SULFATE (2.5 MG/3ML) 0.083% IN NEBU
5.0000 mg | INHALATION_SOLUTION | Freq: Once | RESPIRATORY_TRACT | Status: AC
  Administered 2016-06-04: 5 mg via RESPIRATORY_TRACT
  Filled 2016-06-04: qty 6

## 2016-06-04 MED ORDER — GI COCKTAIL ~~LOC~~
30.0000 mL | Freq: Once | ORAL | Status: AC
  Administered 2016-06-04: 30 mL via ORAL
  Filled 2016-06-04: qty 30

## 2016-06-04 NOTE — ED Triage Notes (Addendum)
States SOB with hx of COPD since last night, states no relief with inhalers, denies any cough or pain, states she feels as if her heart is fluttering, awake and alert in no acute distress

## 2016-06-04 NOTE — ED Notes (Signed)

## 2016-06-04 NOTE — ED Notes (Signed)
Pt presents to ED 09; when asked by this RN what brings the pt to the ED today, pt states that she woke up at about 0230 this morning with shortness of breath, then she took her inhaler and a drink of water and the shortness of breath was gone; then today she had something to eat during lunch and noticed that "I've had a lot of gas and that's why I'm here"; pt denies any shortness of breath, dizziness, light headedness, vision changes, nausea, vomiting, or diarrhea. Pt is awake, alert and oriented x4 and able to speak in complete sentences; pt does not appear to be in acute distress at this time.

## 2016-06-04 NOTE — ED Provider Notes (Signed)
[facility] Emergency Department Provider Note  Time seen: 7:46 PM  I have reviewed the triage vital signs and the nursing notes.   HISTORY  Chief Complaint Shortness of Breath    HPI Colleen [name] is a 71 y.o. female with a past medical history of asthma, COPD, hypertension, hyperlipidemia who presents to the emergency department for complaints of shortness of breath last night as well as indigestion/gassy pains today. According to the patient she had her window open last night trying to sleep. She believes the pollen aggravated her seasonal allergies which cause her to have some mild shortness of breath. She states she took her albuterol inhaler around 2 AM with complete resolution of her shortness of breath. Denies any further shortness of breath since that time. However she states this morning she has been experiencing a lot of gas and belching with a mild sensation of indigestion in her upper abdomen. She states the discomfort is gone but she continues to belch. Denies any chest pain at any point. Denies any diaphoresis or nausea. Denies vomiting. Overall the patient appears well, calm, cooperative, pleasant and in no distress. States her symptoms have largely resolved besides the belching.  Past Medical History:  Diagnosis Date  . Asthma   . COPD (chronic obstructive pulmonary disease) ([place])   . Gout   . Hyperlipidemia   . Hypertension   . Osteoarthrosis     Patient Active Problem List   Diagnosis Date Noted  . Other allergic rhinitis 01/10/2015  . Allergic rhinitis, seasonal 07/30/2014  . Airway hyperreactivity 07/30/2014  . Elevated blood sugar 07/30/2014  . Gout 07/30/2014  . HLD (hyperlipidemia) 07/30/2014  . Encounter for immunization 07/30/2014  . Arthritis, degenerative 07/30/2014  . Borderline diabetes 07/30/2014  . Asthma, mild persistent 07/30/2014  . COPD (chronic obstructive pulmonary disease) ([place]) 07/09/2008  . Benign essential HTN  06/22/2006    Past Surgical History:  Procedure Laterality Date  . LUMBAR LAMINECTOMY      Prior to Admission medications   Medication Sig Start Date End Date Taking? Authorizing Provider  albuterol (PROVENTIL HFA;VENTOLIN HFA) 108 (90 Base) MCG/ACT inhaler Inhale 1 puff into the lungs every 6 (six) hours as needed for wheezing or shortness of breath. 12/31/15   Roselee Nova, MD  allopurinol (ZYLOPRIM) 100 MG tablet Take 1 tablet (100 mg total) by mouth daily. 07/01/15   Plonk, Gwyndolyn Saxon, MD  aspirin EC 81 MG tablet Take 81 mg by mouth daily.    [provider]  budesonide-formoterol (SYMBICORT) 160-4.5 MCG/ACT inhaler Inhale 2 puffs into the lungs 2 (two) times daily. 12/31/15   Roselee Nova, MD  diclofenac sodium (VOLTAREN) 1 % GEL Apply 4 g topically 4 (four) times daily as needed. 4 grams on each knee 4 x a day if needed for pain Patient not taking: Reported on 12/31/2015 07/31/15   Arnetha Courser, MD  enalapril (VASOTEC) 20 MG tablet Take 1 tablet (20 mg total) by mouth daily. 07/01/15   Plonk, Gwyndolyn Saxon, MD  fluticasone (FLONASE) 50 MCG/ACT nasal spray Place 2 sprays into both nostrils daily. 07/01/15   Plonk, Gwyndolyn Saxon, MD  hydrochlorothiazide (MICROZIDE) 12.5 MG capsule Take 1 capsule (12.5 mg total) by mouth daily. 07/01/15   Plonk, Gwyndolyn Saxon, MD  lovastatin (MEVACOR) 40 MG tablet Take 1 tablet (40 mg total) by mouth at bedtime. 07/01/15   Plonk, Gwyndolyn Saxon, MD  meclizine (ANTIVERT) 25 MG tablet TAKE ONE TABLET BY MOUTH THREE TIMES DAILY AS NEEDED  FOR DIZZINESS 05/05/15   Ashok Norris, MD  naproxen (NAPROSYN) 500 MG tablet Take 1 tablet (500 mg total) by mouth daily. 05/28/16   Roselee Nova, MD  Spacer/Aero-Holding Josiah Lobo DEVI 1 Units by Does not apply route 2 (two) times daily as needed. 07/01/15   Plonk, Gwyndolyn Saxon, MD  triamcinolone cream (KENALOG) 0.1 % APPLY CREAM EXTERNALLY TO AFFECTED AREA TWICE DAILY 05/28/16   Roselee Nova, MD    No Known Allergies  Family History   Problem Relation Age of Onset  . Diabetes Mother     Social History Social History  Substance Use Topics  . Smoking status: Former Smoker    Quit date: 11/13/2013  . Smokeless tobacco: Not on file  . Alcohol use 0.0 oz/week     Comment: social     Review of Systems Constitutional: Negative for fever. Eyes: Negative for visual changes. ENT: Negative for congestion Cardiovascular: Negative for chest pain. Respiratory: Shortness of breath over night, now resolved Gastrointestinal: Negative for abdominal pain, vomiting and diarrhea. Positive for belching. Genitourinary: Negative for dysuria. Musculoskeletal: Negative for back pain. Skin: Negative for rash. Neurological: Negative for headache All other ROS negative  ____________________________________________   PHYSICAL EXAM:  VITAL SIGNS: ED Triage Vitals  Enc Vitals Group     BP 06/04/16 1548 (!) 142/58     Pulse Rate 06/04/16 1548 100     Resp 06/04/16 1548 18     Temp 06/04/16 1548 98.3 F (36.8 C)     Temp Source 06/04/16 1548 Oral     SpO2 06/04/16 1548 100 %     Weight 06/04/16 1549 299 lb (135.6 kg)     Height 06/04/16 1549 5\' 3"  (1.6 m)     Head Circumference --      Peak Flow --      Pain Score --      Pain Loc --      Pain Edu? --      Excl. in [place]? --     Constitutional: Alert and oriented. Well appearing and in no distress. Eyes: Normal exam ENT   Head: Normocephalic and atraumatic.   Mouth/Throat: Mucous membranes are moist. Cardiovascular: Normal rate, regular rhythm. No murmur Respiratory: Normal respiratory effort without tachypnea nor retractions. Breath sounds are clear Gastrointestinal: Soft and nontender. No distention.   Musculoskeletal: Nontender with normal range of motion in all extremities.  Neurologic:  Normal speech and language. No gross focal neurologic deficits Skin:  Skin is warm, dry and intact.  Psychiatric: Mood and affect are normal. Speech and behavior are normal.    ____________________________________________    EKG  EKG reviewed and interpreted by myself shows sinus tachycardia 105 bpm, narrow QRS, normal axis, normal intervals, nonspecific ST changes. No ST elevation.  ____________________________________________    RADIOLOGY  Chest x-ray negative  ____________________________________________   INITIAL IMPRESSION / ASSESSMENT AND PLAN / ED COURSE  Pertinent labs & imaging results that were available during my care of the patient were reviewed by me and considered in my medical decision making (see chart for details).  Patient process to the emergency department shortness breath over night which has completely resolved. Patient's only complaint now is of belching. She states earlier today she was having a sensation of indigestion in her upper abdomen. She states this is a fairly chronic issue for her and happens on a nearly daily basis especially when she eats fried or oily foods. She states she ate fried fish yesterday which  she believes have exacerbated her symptoms today. Patient has a normal exam, nontender abdomen. Patient's labs are largely within normal limits. We will add on a troponin, and check liver function tests as well as a lipase.  Patient sat on lab work/repeat lab work looks normal. Patient states she feels much better after GI cocktail. We will discharge with PCP follow-up and simethicone to be used as needed.  ____________________________________________   FINAL CLINICAL IMPRESSION(S) / ED DIAGNOSES  Abdominal discomfort Dyspnea    Harvest Dark, MD 06/04/16 2043

## 2016-06-30 ENCOUNTER — Encounter: Payer: Self-pay | Admitting: Family Medicine

## 2016-06-30 ENCOUNTER — Ambulatory Visit (INDEPENDENT_AMBULATORY_CARE_PROVIDER_SITE_OTHER): Payer: Medicare HMO | Admitting: Family Medicine

## 2016-06-30 VITALS — BP 114/63 | HR 129 | Temp 97.7°F | Resp 18 | Ht 63.0 in | Wt 294.6 lb

## 2016-06-30 DIAGNOSIS — M10072 Idiopathic gout, left ankle and foot: Secondary | ICD-10-CM | POA: Diagnosis not present

## 2016-06-30 DIAGNOSIS — I10 Essential (primary) hypertension: Secondary | ICD-10-CM

## 2016-06-30 DIAGNOSIS — J449 Chronic obstructive pulmonary disease, unspecified: Secondary | ICD-10-CM

## 2016-06-30 DIAGNOSIS — M170 Bilateral primary osteoarthritis of knee: Secondary | ICD-10-CM

## 2016-06-30 DIAGNOSIS — E7800 Pure hypercholesterolemia, unspecified: Secondary | ICD-10-CM

## 2016-06-30 MED ORDER — LOVASTATIN 40 MG PO TABS: 40.0000 mg | ORAL_TABLET | Freq: Every day | ORAL | 0 refills | Status: DC

## 2016-06-30 MED ORDER — NAPROXEN 500 MG PO TABS: 500.0000 mg | ORAL_TABLET | Freq: Every day | ORAL | 0 refills | Status: DC

## 2016-06-30 MED ORDER — ALLOPURINOL 100 MG PO TABS: 100.0000 mg | ORAL_TABLET | Freq: Every day | ORAL | 0 refills | Status: DC

## 2016-06-30 MED ORDER — ENALAPRIL MALEATE 20 MG PO TABS: 20.0000 mg | ORAL_TABLET | Freq: Every day | ORAL | 0 refills | Status: DC

## 2016-06-30 MED ORDER — HYDROCHLOROTHIAZIDE 12.5 MG PO CAPS: 12.5000 mg | ORAL_CAPSULE | Freq: Every day | ORAL | 0 refills | Status: DC

## 2016-06-30 MED ORDER — ALBUTEROL SULFATE HFA 108 (90 BASE) MCG/ACT IN AERS: 1.0000 | INHALATION_SPRAY | Freq: Four times a day (QID) | RESPIRATORY_TRACT | 5 refills | Status: DC | PRN

## 2016-06-30 NOTE — Progress Notes (Signed)
Name: Colleen [name]   MRN: [id number]    DOB: 09-Jul-1945   Date:06/30/2016       Progress Note  Subjective  Chief Complaint  Chief Complaint  Patient presents with  . Follow-up    6 mo  . Medication Refill    Hypertension  This is a chronic problem. The problem is unchanged. The problem is controlled. Pertinent negatives include no blurred vision or palpitations. Past treatments include ACE inhibitors and diuretics. Hypertensive end-organ damage includes kidney disease. There is no history of CVA.  Hyperlipidemia  This is a chronic problem. The problem is controlled. Recent lipid tests were reviewed and are normal. Pertinent negatives include no leg pain. Current antihyperlipidemic treatment includes statins.   COPD; Has history of COPD main symptoms are dyspnea, no orthopnea, no chest tightness or coughing, no wheezing (unless she sleep with a fan nearby). She is on Albuterol and Symbicort for maintenance. She has quit smoking since 2010.   Past Medical History:  Diagnosis Date  . Asthma   . COPD (chronic obstructive pulmonary disease) ([place])   . Gout   . Hyperlipidemia   . Hypertension   . Osteoarthrosis     Past Surgical History:  Procedure Laterality Date  . LUMBAR LAMINECTOMY      Family History  Problem Relation Age of Onset  . Diabetes Mother     Social History   Social History  . Marital status: Married    Spouse name: N/A  . Number of children: N/A  . Years of education: N/A   Occupational History  . Not on file.   Social History Main Topics  . Smoking status: Former Smoker    Quit date: 11/13/2013  . Smokeless tobacco: Never Used  . Alcohol use 0.0 oz/week     Comment: social   . Drug use: Unknown  . Sexual activity: Not Currently   Other Topics Concern  . Not on file   Social History Narrative  . No narrative on file     Current Outpatient Prescriptions:  .  albuterol (PROVENTIL HFA;VENTOLIN HFA) 108 (90 Base) MCG/ACT inhaler, Inhale 1  puff into the lungs every 6 (six) hours as needed for wheezing or shortness of breath., Disp: 1 Inhaler, Rfl: 5 .  allopurinol (ZYLOPRIM) 100 MG tablet, Take 1 tablet (100 mg total) by mouth daily., Disp: 90 tablet, Rfl: 3 .  aspirin EC 81 MG tablet, Take 81 mg by mouth daily., Disp: , Rfl:  .  budesonide-formoterol (SYMBICORT) 160-4.5 MCG/ACT inhaler, Inhale 2 puffs into the lungs 2 (two) times daily., Disp: 1 Inhaler, Rfl: 5 .  diclofenac sodium (VOLTAREN) 1 % GEL, Apply 4 g topically 4 (four) times daily as needed. 4 grams on each knee 4 x a day if needed for pain, Disp: 100 g, Rfl: 2 .  enalapril (VASOTEC) 20 MG tablet, Take 1 tablet (20 mg total) by mouth daily., Disp: 90 tablet, Rfl: 3 .  fluticasone (FLONASE) 50 MCG/ACT nasal spray, Place 2 sprays into both nostrils daily., Disp: 16 g, Rfl: 2 .  hydrochlorothiazide (MICROZIDE) 12.5 MG capsule, Take 1 capsule (12.5 mg total) by mouth daily., Disp: 90 capsule, Rfl: 3 .  lovastatin (MEVACOR) 40 MG tablet, Take 1 tablet (40 mg total) by mouth at bedtime., Disp: 90 tablet, Rfl: 3 .  meclizine (ANTIVERT) 25 MG tablet, TAKE ONE TABLET BY MOUTH THREE TIMES DAILY AS NEEDED FOR DIZZINESS, Disp: 30 tablet, Rfl: 0 .  naproxen (NAPROSYN) 500 MG tablet, Take 1  tablet (500 mg total) by mouth daily., Disp: 30 tablet, Rfl: 0 .  simethicone (GAS-X) 80 MG chewable tablet, Chew 1 tablet (80 mg total) by mouth 4 (four) times daily as needed for flatulence., Disp: 100 tablet, Rfl: 2 .  Spacer/Aero-Holding Chambers DEVI, 1 Units by Does not apply route 2 (two) times daily as needed., Disp: 1 each, Rfl: 2 .  triamcinolone cream (KENALOG) 0.1 %, APPLY CREAM EXTERNALLY TO AFFECTED AREA TWICE DAILY, Disp: 80 g, Rfl: 0  Current Facility-Administered Medications:  .  albuterol (PROVENTIL) (2.5 MG/3ML) 0.083% nebulizer solution 2.5 mg, 2.5 mg, Nebulization, Once, Ashok Norris, MD  No Known Allergies   Review of Systems  Eyes: Negative for blurred vision.    Cardiovascular: Negative for palpitations.    Objective  Vitals:   06/30/16 0840  BP: 114/63  Pulse: (!) 129  Resp: 18  Temp: 97.7 F (36.5 C)  TempSrc: Oral  SpO2: 96%  Weight: 294 lb 9.6 oz (133.6 kg)  Height: 5\' 3"  (1.6 m)    Physical Exam  Constitutional: She is oriented to person, place, and time and well-developed, well-nourished, and in no distress.  HENT:  Head: Normocephalic and atraumatic.  Cardiovascular: Normal rate, regular rhythm and normal heart sounds.   No murmur heard. Pulmonary/Chest: Effort normal and breath sounds normal. She has no wheezes.  Abdominal: Soft. Bowel sounds are normal. There is no tenderness.  Musculoskeletal:       Right knee: Tenderness found.       Left knee: Tenderness found. No medial joint line tenderness noted.       Legs: Neurological: She is alert and oriented to person, place, and time.  Psychiatric: Mood, memory, affect and judgment normal.  Nursing note and vitals reviewed.     Assessment & Plan  1. Chronic obstructive pulmonary disease, unspecified COPD type ([place]) Refills for albuterol provided - albuterol (PROVENTIL HFA;VENTOLIN HFA) 108 (90 Base) MCG/ACT inhaler; Inhale 1 puff into the lungs every 6 (six) hours as needed for wheezing or shortness of breath.  Dispense: 1 Inhaler; Refill: 5  2. Benign essential HTN BP stable on present antihypertensive therapy - hydrochlorothiazide (MICROZIDE) 12.5 MG capsule; Take 1 capsule (12.5 mg total) by mouth daily.  Dispense: 90 capsule; Refill: 0 - enalapril (VASOTEC) 20 MG tablet; Take 1 tablet (20 mg total) by mouth daily.  Dispense: 90 tablet; Refill: 0  3. Pure hypercholesterolemia  - lovastatin (MEVACOR) 40 MG tablet; Take 1 tablet (40 mg total) by mouth at bedtime.  Dispense: 90 tablet; Refill: 0  4. Idiopathic gout of left foot, unspecified chronicity No gout attacks since initiation of allopurinol, continue on present treatment - allopurinol (ZYLOPRIM) 100 MG  tablet; Take 1 tablet (100 mg total) by mouth daily.  Dispense: 90 tablet; Refill: 0  5. Primary osteoarthritis of both knees  - naproxen (NAPROSYN) 500 MG tablet; Take 1 tablet (500 mg total) by mouth daily.  Dispense: 90 tablet; Refill: 0   Aaima Gaddie Asad A. [facility] 06/30/2016 8:48 AM

## 2016-07-25 ENCOUNTER — Other Ambulatory Visit: Payer: Self-pay | Admitting: Family Medicine

## 2016-08-16 ENCOUNTER — Ambulatory Visit (INDEPENDENT_AMBULATORY_CARE_PROVIDER_SITE_OTHER): Payer: Medicare HMO

## 2016-08-16 VITALS — Ht 63.0 in | Wt 291.5 lb

## 2016-08-16 DIAGNOSIS — Z13820 Encounter for screening for osteoporosis: Secondary | ICD-10-CM | POA: Diagnosis not present

## 2016-08-16 DIAGNOSIS — Z0000 Encounter for general adult medical examination without abnormal findings: Secondary | ICD-10-CM | POA: Diagnosis not present

## 2016-08-16 NOTE — Progress Notes (Signed)
Subjective:   Colleen [name] is a 71 y.o. female who presents for Medicare Annual (Subsequent) preventive examination.  Review of Systems:  N/A  Cardiac Risk Factors include: advanced age (>64men, >69 women);obesity (BMI >30kg/m2);hypertension;dyslipidemia     Objective:     Vitals: Ht 5\' 3"  (1.6 m)   Wt 291 lb 8 oz (132.2 kg)   BMI 51.64 kg/m   Body mass index is 51.64 kg/m.   Tobacco History  Smoking Status  . Former Smoker  . Quit date: 11/13/2008  Smokeless Tobacco  . Never Used     Counseling given: Not Answered   Past Medical History:  Diagnosis Date  . Asthma   . COPD (chronic obstructive pulmonary disease) ([place])   . Gout   . Hyperlipidemia   . Hypertension   . Osteoarthrosis    Past Surgical History:  Procedure Laterality Date  . LUMBAR LAMINECTOMY     Family History  Problem Relation Age of Onset  . Diabetes Mother   . Diabetes Sister   . Congestive Heart Failure Sister   . Glaucoma Sister    History  Sexual Activity  . Sexual activity: Not Currently    Outpatient Encounter Prescriptions as of 08/16/2016  Medication Sig  . albuterol (PROVENTIL HFA;VENTOLIN HFA) 108 (90 Base) MCG/ACT inhaler Inhale 1 puff into the lungs every 6 (six) hours as needed for wheezing or shortness of breath.  . allopurinol (ZYLOPRIM) 100 MG tablet Take 1 tablet (100 mg total) by mouth daily.  Marland Kitchen aspirin EC 81 MG tablet Take 81 mg by mouth daily.  . budesonide-formoterol (SYMBICORT) 160-4.5 MCG/ACT inhaler Inhale 2 puffs into the lungs 2 (two) times daily.  . diclofenac sodium (VOLTAREN) 1 % GEL Apply 4 g topically 4 (four) times daily as needed. 4 grams on each knee 4 x a day if needed for pain  . enalapril (VASOTEC) 20 MG tablet Take 1 tablet (20 mg total) by mouth daily.  . fluticasone (FLONASE) 50 MCG/ACT nasal spray Place 2 sprays into both nostrils daily.  . hydrochlorothiazide (MICROZIDE) 12.5 MG capsule Take 1 capsule (12.5 mg total) by mouth daily.  Marland Kitchen  lovastatin (MEVACOR) 40 MG tablet Take 1 tablet (40 mg total) by mouth at bedtime.  . meclizine (ANTIVERT) 25 MG tablet TAKE ONE TABLET BY MOUTH THREE TIMES DAILY AS NEEDED FOR DIZZINESS  . naproxen (NAPROSYN) 500 MG tablet Take 1 tablet (500 mg total) by mouth daily.  . simethicone (GAS-X) 80 MG chewable tablet Chew 1 tablet (80 mg total) by mouth 4 (four) times daily as needed for flatulence.  Marland Kitchen Spacer/Aero-Holding Chambers DEVI 1 Units by Does not apply route 2 (two) times daily as needed.  . triamcinolone cream (KENALOG) 0.1 % APPLY CREAM EXTERNALLY TO AFFECTED AREA TWICE DAILY  . [DISCONTINUED] albuterol (PROVENTIL) (2.5 MG/3ML) 0.083% nebulizer solution 2.5 mg    No facility-administered encounter medications on file as of 08/16/2016.     Activities of Daily Living In your present state of health, do you have any difficulty performing the following activities: 08/16/2016 06/30/2016  Hearing? N N  Vision? Y Y  Difficulty concentrating or making decisions? N N  Walking or climbing stairs? Y Y  Dressing or bathing? N N  Doing errands, shopping? N N  Preparing Food and eating ? N -  Using the Toilet? N -  In the past six months, have you accidently leaked urine? Y -  Do you have problems with loss of bowel control? N -  Managing your Medications? N -  Managing your Finances? N -  Housekeeping or managing your Housekeeping? N -  Some recent data might be hidden    Patient Care Team: Roselee Nova, MD as PCP - General (Family Medicine)    Assessment:     Exercise Activities and Dietary recommendations Current Exercise Habits: The patient does not participate in regular exercise at present, Exercise limited by: orthopedic condition(s)  Goals    . Reduce sugar intake          Continue decreasing sugars in daily diet. Pt to avoid breads, sodas and tea.,       Fall Risk Fall Risk  08/16/2016 06/30/2016 01/10/2015 01/02/2015 12/02/2014  Falls in the past year? No No No No No    Depression Screen PHQ 2/9 Scores 08/16/2016 06/30/2016 01/10/2015 01/02/2015  PHQ - 2 Score 0 0 0 0     Cognitive Function     6CIT Screen 08/16/2016  What Year? 0 points  What month? 0 points  What time? 0 points  Count back from 20 0 points  Months in reverse 0 points  Repeat phrase 8 points  Total Score 8    Immunization History  Administered Date(s) Administered  . Influenza-Unspecified 10/26/2014, 09/27/2015  . Pneumococcal Conjugate-13 02/21/2013  . Pneumococcal Polysaccharide-23 10/13/2011  . Tdap 10/13/2011   Screening Tests Health Maintenance  Topic Date Due  . COLONOSCOPY  01/25/2014  . MAMMOGRAM  04/04/2016  . DEXA SCAN  11/23/2016 (Originally 04/09/2011)  . INFLUENZA VACCINE  08/25/2016  . TETANUS/TDAP  10/12/2021  . Hepatitis C Screening  Completed  . PNA vac Low Risk Adult  Completed      Plan:  I have personally reviewed and addressed the Medicare Annual Wellness questionnaire and have noted the following in the patient's chart:  A. Medical and social history B. Use of alcohol, tobacco or illicit drugs  C. Current medications and supplements D. Functional ability and status E.  Nutritional status F.  Physical activity G. Advance directives H. List of other physicians I.  Hospitalizations, surgeries, and ER visits in previous 12 months J.  [place] such as hearing and vision if needed, cognitive and depression L. Referrals and appointments - none  In addition, I have reviewed and discussed with patient certain preventive protocols, quality metrics, and best practice recommendations. A written personalized care plan for preventive services as well as general preventive health recommendations were provided to patient.  See attached scanned questionnaire for additional information.   Signed,  Fabio Neighbors, LPN Nurse Health Advisor   MD Recommendations: DEXA screening ordered today. Pt to schedule CPE with PCP next week for follow  up breast exam for mammogram order and cologuard discussion.

## 2016-08-16 NOTE — Patient Instructions (Signed)
Colleen [name] , Thank you for taking time to come for your Medicare Wellness Visit. I appreciate your ongoing commitment to your health goals. Please review the following plan we discussed and let me know if I can assist you in the future.   Screening recommendations/referrals: Colonoscopy: to discuss with PCP at CPE Mammogram: to discuss with PCP at CPE Bone Density: ordered today Recommended yearly ophthalmology/optometry visit for glaucoma screening and checkup Recommended yearly dental visit for hygiene and checkup  Vaccinations: Influenza vaccine: due 09/2016 Pneumococcal vaccine: completed series Tdap vaccine: completed 10/13/11, due 09/2021 Shingles vaccine: declined    Advanced directives: Advance directive discussed with you today. Even though you declined this today please call our office should you change your mind and we can give you the proper paperwork for you to fill out.  Conditions/risks identified: Obesity; Continue decreasing sugars in daily diet.  Next appointment: None, need to schedule follow up with PCP and 1 year AWV.   Preventive Care 70 Years and Older, Female Preventive care refers to lifestyle choices and visits with your health care provider that can promote health and wellness. What does preventive care include?  A yearly physical exam. This is also called an annual well check.  Dental exams once or twice a year.  Routine eye exams. Ask your health care provider how often you should have your eyes checked.  Personal lifestyle choices, including:  Daily care of your teeth and gums.  Regular physical activity.  Eating a healthy diet.  Avoiding tobacco and drug use.  Limiting alcohol use.  Practicing safe sex.  Taking low-dose aspirin every day.  Taking vitamin and mineral supplements as recommended by your health care provider. What happens during an annual well check? The services and screenings done by your health care provider during your  annual well check will depend on your age, overall health, lifestyle risk factors, and family history of disease. Counseling  Your health care provider may ask you questions about your:  Alcohol use.  Tobacco use.  Drug use.  Emotional well-being.  Home and relationship well-being.  Sexual activity.  Eating habits.  History of falls.  Memory and ability to understand (cognition).  Work and work Statistician.  Reproductive health. Screening  You may have the following tests or measurements:  Height, weight, and BMI.  Blood pressure.  Lipid and cholesterol levels. These may be checked every 5 years, or more frequently if you are over 40 years old.  Skin check.  Lung cancer screening. You may have this screening every year starting at age 23 if you have a 30-pack-year history of smoking and currently smoke or have quit within the past 15 years.  Fecal occult blood test (FOBT) of the stool. You may have this test every year starting at age 38.  Flexible sigmoidoscopy or colonoscopy. You may have a sigmoidoscopy every 5 years or a colonoscopy every 10 years starting at age 30.  Hepatitis C blood test.  Hepatitis B blood test.  Sexually transmitted disease (STD) testing.  Diabetes screening. This is done by checking your blood sugar (glucose) after you have not eaten for a while (fasting). You may have this done every 1-3 years.  Bone density scan. This is done to screen for osteoporosis. You may have this done starting at age 82.  Mammogram. This may be done every 1-2 years. Talk to your health care provider about how often you should have regular mammograms. Talk with your health care provider about your test  results, treatment options, and if necessary, the need for more tests. Vaccines  Your health care provider may recommend certain vaccines, such as:  Influenza vaccine. This is recommended every year.  Tetanus, diphtheria, and acellular pertussis (Tdap, Td)  vaccine. You may need a Td booster every 10 years.  Zoster vaccine. You may need this after age 23.  Pneumococcal 13-valent conjugate (PCV13) vaccine. One dose is recommended after age 39.  Pneumococcal polysaccharide (PPSV23) vaccine. One dose is recommended after age 64. Talk to your health care provider about which screenings and vaccines you need and how often you need them. This information is not intended to replace advice given to you by your health care provider. Make sure you discuss any questions you have with your health care provider. Document Released: 02/07/2015 Document Revised: 10/01/2015 Document Reviewed: 11/12/2014 Elsevier Interactive Patient Education  2017 [place] Prevention in the Home Falls can cause injuries. They can happen to people of all ages. There are many things you can do to make your home safe and to help prevent falls. What can I do on the outside of my home?  Regularly fix the edges of walkways and driveways and fix any cracks.  Remove anything that might make you trip as you walk through a door, such as a raised step or threshold.  Trim any bushes or trees on the path to your home.  Use bright outdoor lighting.  Clear any walking paths of anything that might make someone trip, such as rocks or tools.  Regularly check to see if handrails are loose or broken. Make sure that both sides of any steps have handrails.  Any raised decks and porches should have guardrails on the edges.  Have any leaves, snow, or ice cleared regularly.  Use sand or salt on walking paths during winter.  Clean up any spills in your garage right away. This includes oil or grease spills. What can I do in the bathroom?  Use night lights.  Install grab bars by the toilet and in the tub and shower. Do not use towel bars as grab bars.  Use non-skid mats or decals in the tub or shower.  If you need to sit down in the shower, use a plastic, non-slip  stool.  Keep the floor dry. Clean up any water that spills on the floor as soon as it happens.  Remove soap buildup in the tub or shower regularly.  Attach bath mats securely with double-sided non-slip rug tape.  Do not have throw rugs and other things on the floor that can make you trip. What can I do in the bedroom?  Use night lights.  Make sure that you have a light by your bed that is easy to reach.  Do not use any sheets or blankets that are too big for your bed. They should not hang down onto the floor.  Have a firm chair that has side arms. You can use this for support while you get dressed.  Do not have throw rugs and other things on the floor that can make you trip. What can I do in the kitchen?  Clean up any spills right away.  Avoid walking on wet floors.  Keep items that you use a lot in easy-to-reach places.  If you need to reach something above you, use a strong step stool that has a grab bar.  Keep electrical cords out of the way.  Do not use floor polish or wax that makes  floors slippery. If you must use wax, use non-skid floor wax.  Do not have throw rugs and other things on the floor that can make you trip. What can I do with my stairs?  Do not leave any items on the stairs.  Make sure that there are handrails on both sides of the stairs and use them. Fix handrails that are broken or loose. Make sure that handrails are as long as the stairways.  Check any carpeting to make sure that it is firmly attached to the stairs. Fix any carpet that is loose or worn.  Avoid having throw rugs at the top or bottom of the stairs. If you do have throw rugs, attach them to the floor with carpet tape.  Make sure that you have a light switch at the top of the stairs and the bottom of the stairs. If you do not have them, ask someone to add them for you. What else can I do to help prevent falls?  Wear shoes that:  Do not have high heels.  Have rubber bottoms.  Are  comfortable and fit you well.  Are closed at the toe. Do not wear sandals.  If you use a stepladder:  Make sure that it is fully opened. Do not climb a closed stepladder.  Make sure that both sides of the stepladder are locked into place.  Ask someone to hold it for you, if possible.  Clearly mark and make sure that you can see:  Any grab bars or handrails.  First and last steps.  Where the edge of each step is.  Use tools that help you move around (mobility aids) if they are needed. These include:  Canes.  Walkers.  Scooters.  Crutches.  Turn on the lights when you go into a dark area. Replace any light bulbs as soon as they burn out.  Set up your furniture so you have a clear path. Avoid moving your furniture around.  If any of your floors are uneven, fix them.  If there are any pets around you, be aware of where they are.  Review your medicines with your doctor. Some medicines can make you feel dizzy. This can increase your chance of falling. Ask your doctor what other things that you can do to help prevent falls. This information is not intended to replace advice given to you by your health care provider. Make sure you discuss any questions you have with your health care provider. Document Released: 11/07/2008 Document Revised: 06/19/2015 Document Reviewed: 02/15/2014 Elsevier Interactive Patient Education  2017 [employer].

## 2016-10-04 ENCOUNTER — Ambulatory Visit (INDEPENDENT_AMBULATORY_CARE_PROVIDER_SITE_OTHER): Payer: Medicare HMO | Admitting: Family Medicine

## 2016-10-04 ENCOUNTER — Encounter: Payer: Self-pay | Admitting: Family Medicine

## 2016-10-04 VITALS — BP 118/68 | HR 102 | Temp 97.5°F | Resp 17 | Ht 63.0 in | Wt 290.2 lb

## 2016-10-04 DIAGNOSIS — Z1239 Encounter for other screening for malignant neoplasm of breast: Secondary | ICD-10-CM

## 2016-10-04 DIAGNOSIS — Z1231 Encounter for screening mammogram for malignant neoplasm of breast: Secondary | ICD-10-CM | POA: Diagnosis not present

## 2016-10-04 DIAGNOSIS — Z1211 Encounter for screening for malignant neoplasm of colon: Secondary | ICD-10-CM | POA: Diagnosis not present

## 2016-10-04 DIAGNOSIS — Z23 Encounter for immunization: Secondary | ICD-10-CM | POA: Diagnosis not present

## 2016-10-04 DIAGNOSIS — D242 Benign neoplasm of left breast: Secondary | ICD-10-CM

## 2016-10-04 DIAGNOSIS — Z0000 Encounter for general adult medical examination without abnormal findings: Secondary | ICD-10-CM | POA: Diagnosis not present

## 2016-10-04 NOTE — Progress Notes (Signed)
Name: MIYANI CRONIC   MRN: [id number]    DOB: [birth year]   Date:10/04/2016       Progress Note  Subjective  Chief Complaint  Chief Complaint  Patient presents with  . Annual Exam    CPE    HPI  Pt. Presents for Complete Physical Exam.  She is due for screening for colon cancer and breast cancer (Reportedly had an abnormal mammogram with ultrasound guided core biopsy showing a papilloma on histopathological diagnosis. She was advised to have excision but apparently never followed up She is due for flu shot.    Past Medical History:  Diagnosis Date  . Asthma   . COPD (chronic obstructive pulmonary disease) ([place])   . Gout   . Hyperlipidemia   . Hypertension   . Osteoarthrosis     Past Surgical History:  Procedure Laterality Date  . LUMBAR LAMINECTOMY      Family History  Problem Relation Age of Onset  . Diabetes Mother   . Diabetes Sister   . Congestive Heart Failure Sister   . Glaucoma Sister     Social History   Social History  . Marital status: Married    Spouse name: N/A  . Number of children: N/A  . Years of education: N/A   Occupational History  . Not on file.   Social History Main Topics  . Smoking status: Former Smoker    Quit date: 11/13/2008  . Smokeless tobacco: Never Used  . Alcohol use 3.6 oz/week    6 Cans of beer per week  . Drug use: Unknown  . Sexual activity: Not Currently   Other Topics Concern  . Not on file   Social History Narrative  . No narrative on file     Current Outpatient Prescriptions:  .  albuterol (PROVENTIL HFA;VENTOLIN HFA) 108 (90 Base) MCG/ACT inhaler, Inhale 1 puff into the lungs every 6 (six) hours as needed for wheezing or shortness of breath., Disp: 1 Inhaler, Rfl: 5 .  allopurinol (ZYLOPRIM) 100 MG tablet, Take 1 tablet (100 mg total) by mouth daily., Disp: 90 tablet, Rfl: 0 .  aspirin EC 81 MG tablet, Take 81 mg by mouth daily., Disp: , Rfl:  .  budesonide-formoterol (SYMBICORT) 160-4.5 MCG/ACT inhaler,  Inhale 2 puffs into the lungs 2 (two) times daily., Disp: 1 Inhaler, Rfl: 5 .  diclofenac sodium (VOLTAREN) 1 % GEL, Apply 4 g topically 4 (four) times daily as needed. 4 grams on each knee 4 x a day if needed for pain, Disp: 100 g, Rfl: 2 .  enalapril (VASOTEC) 20 MG tablet, Take 1 tablet (20 mg total) by mouth daily., Disp: 90 tablet, Rfl: 0 .  fluticasone (FLONASE) 50 MCG/ACT nasal spray, Place 2 sprays into both nostrils daily., Disp: 16 g, Rfl: 2 .  hydrochlorothiazide (MICROZIDE) 12.5 MG capsule, Take 1 capsule (12.5 mg total) by mouth daily., Disp: 90 capsule, Rfl: 0 .  lovastatin (MEVACOR) 40 MG tablet, Take 1 tablet (40 mg total) by mouth at bedtime., Disp: 90 tablet, Rfl: 0 .  meclizine (ANTIVERT) 25 MG tablet, TAKE ONE TABLET BY MOUTH THREE TIMES DAILY AS NEEDED FOR DIZZINESS, Disp: 30 tablet, Rfl: 0 .  naproxen (NAPROSYN) 500 MG tablet, Take 1 tablet (500 mg total) by mouth daily., Disp: 90 tablet, Rfl: 0 .  simethicone (GAS-X) 80 MG chewable tablet, Chew 1 tablet (80 mg total) by mouth 4 (four) times daily as needed for flatulence., Disp: 100 tablet, Rfl: 2 .  Spacer/Aero-Holding Josiah Lobo  DEVI, 1 Units by Does not apply route 2 (two) times daily as needed., Disp: 1 each, Rfl: 2 .  triamcinolone cream (KENALOG) 0.1 %, APPLY CREAM EXTERNALLY TO AFFECTED AREA TWICE DAILY, Disp: 80 g, Rfl: 0  No Known Allergies   Review of Systems  Constitutional: Negative for chills, fever, malaise/fatigue and weight loss.  HENT: Negative for congestion, ear pain and sore throat.   Eyes: Negative for blurred vision and double vision.  Respiratory: Negative for cough and shortness of breath.   Cardiovascular: Negative for chest pain, palpitations and leg swelling.  Gastrointestinal: Negative for abdominal pain, blood in stool, constipation, diarrhea, nausea and vomiting.  Genitourinary: Negative for hematuria.  Musculoskeletal: Positive for back pain (occasional lower back pain). Negative for neck  pain.  Psychiatric/Behavioral: Negative for depression. The patient is not nervous/anxious and does not have insomnia.      Objective  Vitals:   10/04/16 0832  BP: 118/68  Pulse: (!) 102  Resp: 17  Temp: (!) 97.5 F (36.4 C)  TempSrc: Oral  SpO2: 96%  Weight: 290 lb 3.2 oz (131.6 kg)  Height: 5\' 3"  (1.6 m)    Physical Exam  Constitutional: She is oriented to person, place, and time and well-developed, well-nourished, and in no distress.  HENT:  Head: Normocephalic and atraumatic.  Mouth/Throat: Oropharynx is clear and moist.  Cardiovascular: Normal rate, regular rhythm and normal heart sounds.   No murmur heard. Pulmonary/Chest: Effort normal and breath sounds normal. She has no wheezes.  Abdominal: Soft. Bowel sounds are normal. There is no tenderness.  Musculoskeletal: She exhibits edema.  Neurological: She is alert and oriented to person, place, and time.  Psychiatric: Mood, memory, affect and judgment normal.  Nursing note and vitals reviewed.      Assessment & Plan  1. Well woman exam without gynecological exam Obtain age-appropriate laboratory screening - CBC with Differential/Platelet - TSH - VITAMIN D 25 Hydroxy (Vit-D Deficiency, Fractures) - COMPLETE METABOLIC PANEL WITH GFR  2. Screening for colon cancer  - Cologuard  3. Papilloma of left breast Order diagnostic mammogram and ultrasound as per radiology guidelines consider follow-up with surgical specialty - MM DIAG BREAST TOMO BILATERAL; Future - US BREAST LTD UNI LEFT INC AXILLA; Future - US BREAST LTD UNI RIGHT INC AXILLA; Future  4. Need for influenza vaccination  - Flu vaccine HIGH DOSE PF (Fluzone High dose)  5. Screening for breast cancer As above, order diagnostic mammogram   Chritopher Coster [facility] 10/04/2016 8:40 AM

## 2016-10-05 LAB — VITAMIN D 25 HYDROXY (VIT D DEFICIENCY, FRACTURES): Vit D, 25-Hydroxy: 29 ng/mL — ABNORMAL LOW (ref 30–100)

## 2016-10-05 LAB — CBC WITH DIFFERENTIAL/PLATELET
BASOS PCT: 1.2 %
Basophils Absolute: 94 cells/uL (ref 0–200)
EOS PCT: 3.4 %
Eosinophils Absolute: 265 cells/uL (ref 15–500)
HEMATOCRIT: 40.3 % (ref 35.0–45.0)
HEMOGLOBIN: 13.5 g/dL (ref 11.7–15.5)
LYMPHS ABS: 3221 {cells}/uL (ref 850–3900)
MCH: 29.5 pg (ref 27.0–33.0)
MCHC: 33.5 g/dL (ref 32.0–36.0)
MCV: 88.2 fL (ref 80.0–100.0)
MPV: 10.5 fL (ref 7.5–12.5)
Monocytes Relative: 6.2 %
NEUTROS ABS: 3736 {cells}/uL (ref 1500–7800)
Neutrophils Relative %: 47.9 %
Platelets: 338 10*3/uL (ref 140–400)
RBC: 4.57 10*6/uL (ref 3.80–5.10)
RDW: 12.1 % (ref 11.0–15.0)
Total Lymphocyte: 41.3 %
WBC mixed population: 484 cells/uL (ref 200–950)
WBC: 7.8 10*3/uL (ref 3.8–10.8)

## 2016-10-05 LAB — COMPLETE METABOLIC PANEL WITH GFR
AG Ratio: 0.8 (calc) — ABNORMAL LOW (ref 1.0–2.5)
ALBUMIN MSPROF: 3.6 g/dL (ref 3.6–5.1)
ALT: 7 U/L (ref 6–29)
AST: 15 U/L (ref 10–35)
Alkaline phosphatase (APISO): 118 U/L (ref 33–130)
BUN / CREAT RATIO: 20 (calc) (ref 6–22)
BUN: 21 mg/dL (ref 7–25)
CO2: 27 mmol/L (ref 20–32)
CREATININE: 1.04 mg/dL — AB (ref 0.60–0.93)
Calcium: 9.9 mg/dL (ref 8.6–10.4)
Chloride: 100 mmol/L (ref 98–110)
GFR, EST AFRICAN AMERICAN: 63 mL/min/{1.73_m2} (ref >=60)
GFR, Est Non African American: 54 mL/min/{1.73_m2} — ABNORMAL LOW (ref >=60)
GLUCOSE: 96 mg/dL (ref 65–99)
Globulin: 4.4 g/dL (calc) — ABNORMAL HIGH (ref 1.9–3.7)
Potassium: 4.9 mmol/L (ref 3.5–5.3)
Sodium: 137 mmol/L (ref 135–146)
TOTAL PROTEIN: 8 g/dL (ref 6.1–8.1)
Total Bilirubin: 0.4 mg/dL (ref 0.2–1.2)

## 2016-10-05 LAB — TSH: TSH: 2.79 mIU/L (ref 0.40–4.50)

## 2016-10-11 ENCOUNTER — Ambulatory Visit: Payer: Medicare HMO | Admitting: Family Medicine

## 2016-10-18 ENCOUNTER — Ambulatory Visit (INDEPENDENT_AMBULATORY_CARE_PROVIDER_SITE_OTHER): Payer: Medicare HMO | Admitting: Family Medicine

## 2016-10-18 ENCOUNTER — Encounter: Payer: Self-pay | Admitting: Family Medicine

## 2016-10-18 VITALS — BP 120/70 | HR 111 | Temp 97.9°F | Resp 17 | Ht 63.0 in | Wt 286.6 lb

## 2016-10-18 DIAGNOSIS — I10 Essential (primary) hypertension: Secondary | ICD-10-CM | POA: Diagnosis not present

## 2016-10-18 DIAGNOSIS — M1A0720 Idiopathic chronic gout, left ankle and foot, without tophus (tophi): Secondary | ICD-10-CM | POA: Diagnosis not present

## 2016-10-18 DIAGNOSIS — M170 Bilateral primary osteoarthritis of knee: Secondary | ICD-10-CM | POA: Diagnosis not present

## 2016-10-18 DIAGNOSIS — L309 Dermatitis, unspecified: Secondary | ICD-10-CM | POA: Diagnosis not present

## 2016-10-18 DIAGNOSIS — J449 Chronic obstructive pulmonary disease, unspecified: Secondary | ICD-10-CM | POA: Diagnosis not present

## 2016-10-18 DIAGNOSIS — E7800 Pure hypercholesterolemia, unspecified: Secondary | ICD-10-CM

## 2016-10-18 DIAGNOSIS — R771 Abnormality of globulin: Secondary | ICD-10-CM | POA: Diagnosis not present

## 2016-10-18 LAB — COMPLETE METABOLIC PANEL WITH GFR
AG Ratio: 0.8 (calc) — ABNORMAL LOW (ref 1.0–2.5)
ALBUMIN MSPROF: 3.8 g/dL (ref 3.6–5.1)
ALKALINE PHOSPHATASE (APISO): 125 U/L (ref 33–130)
ALT: 10 U/L (ref 6–29)
AST: 17 U/L (ref 10–35)
BUN / CREAT RATIO: 25 (calc) — AB (ref 6–22)
BUN: 28 mg/dL — ABNORMAL HIGH (ref 7–25)
CO2: 29 mmol/L (ref 20–32)
CREATININE: 1.12 mg/dL — AB (ref 0.60–0.93)
Calcium: 9.8 mg/dL (ref 8.6–10.4)
Chloride: 97 mmol/L — ABNORMAL LOW (ref 98–110)
GFR, EST AFRICAN AMERICAN: 58 mL/min/{1.73_m2} — AB (ref >=60)
GFR, Est Non African American: 50 mL/min/{1.73_m2} — ABNORMAL LOW (ref >=60)
GLOBULIN: 4.6 g/dL — AB (ref 1.9–3.7)
Glucose, Bld: 104 mg/dL — ABNORMAL HIGH (ref 65–99)
Potassium: 5.2 mmol/L (ref 3.5–5.3)
SODIUM: 134 mmol/L — AB (ref 135–146)
TOTAL PROTEIN: 8.4 g/dL — AB (ref 6.1–8.1)
Total Bilirubin: 0.5 mg/dL (ref 0.2–1.2)

## 2016-10-18 LAB — URIC ACID: Uric Acid, Serum: 7.1 mg/dL — ABNORMAL HIGH (ref 2.5–7.0)

## 2016-10-18 MED ORDER — TRIAMCINOLONE ACETONIDE 0.1 % EX CREA: TOPICAL_CREAM | CUTANEOUS | 0 refills | Status: DC

## 2016-10-18 MED ORDER — ALLOPURINOL 100 MG PO TABS: 100.0000 mg | ORAL_TABLET | Freq: Every day | ORAL | 0 refills | Status: DC

## 2016-10-18 MED ORDER — ENALAPRIL MALEATE 20 MG PO TABS: 20.0000 mg | ORAL_TABLET | Freq: Every day | ORAL | 0 refills | Status: DC

## 2016-10-18 MED ORDER — ALBUTEROL SULFATE HFA 108 (90 BASE) MCG/ACT IN AERS: 1.0000 | INHALATION_SPRAY | Freq: Four times a day (QID) | RESPIRATORY_TRACT | 5 refills | Status: DC | PRN

## 2016-10-18 MED ORDER — BUDESONIDE-FORMOTEROL FUMARATE 160-4.5 MCG/ACT IN AERO: 2.0000 | INHALATION_SPRAY | Freq: Two times a day (BID) | RESPIRATORY_TRACT | 5 refills | Status: DC

## 2016-10-18 MED ORDER — LOVASTATIN 40 MG PO TABS: 40.0000 mg | ORAL_TABLET | Freq: Every day | ORAL | 0 refills | Status: DC

## 2016-10-18 MED ORDER — NAPROXEN 500 MG PO TABS: 500.0000 mg | ORAL_TABLET | Freq: Every day | ORAL | 0 refills | Status: DC

## 2016-10-18 MED ORDER — HYDROCHLOROTHIAZIDE 12.5 MG PO CAPS: 12.5000 mg | ORAL_CAPSULE | Freq: Every day | ORAL | 0 refills | Status: DC

## 2016-10-18 NOTE — Progress Notes (Signed)
Name: Colleen [name]   MRN: [id number]    DOB: 1945-02-12   Date:10/18/2016       Progress Note  Subjective  Chief Complaint  Chief Complaint  Patient presents with  . Medication Refill    Hypertension  This is a chronic problem. The problem is unchanged. The problem is controlled. Pertinent negatives include no blurred vision, chest pain, headaches or palpitations. Past treatments include ACE inhibitors and diuretics. Hypertensive end-organ damage includes kidney disease. There is no history of CVA.  Hyperlipidemia  This is a chronic problem. The problem is controlled. Recent lipid tests were reviewed and are normal. Pertinent negatives include no chest pain or leg pain. Current antihyperlipidemic treatment includes statins.  COPD: Patient has COPD, on Albuterol inhaler and Symbicort, denies any chest pain, dyspnea, etc. She has wheezing when it rains or when she forgets to close the window at night. She is able to perform her daily activities without any problem.   Arthritis in Left Knee: She has Osteoarthritis in her left knee, symptoms include pain but no swelling, she takes Naproxen 500 mg daily, reports that it helps relieve the pain, no side effects from Naproxen reported.   Gout: Affected regions include left and/or right feet, starts in her toe and spreads to her ankle. She is on Allopurinol 100 mg daily, last uric acid level was 6.9 g/dL obtained in June 2017. Since then, she has had no gout attacks.   Patient applied triamcinolone cream on her face for intermittent episodes of itching and burning on her face with the skin turning dark or crusted. She was started on the cram 4 years ago, uses regularly along with Vaseline cream for relief.    Past Medical History:  Diagnosis Date  . Asthma   . COPD (chronic obstructive pulmonary disease) ([place])   . Gout   . Hyperlipidemia   . Hypertension   . Osteoarthrosis     Past Surgical History:  Procedure Laterality Date  . LUMBAR  LAMINECTOMY      Family History  Problem Relation Age of Onset  . Diabetes Mother   . Diabetes Sister   . Congestive Heart Failure Sister   . Glaucoma Sister     Social History   Social History  . Marital status: Married    Spouse name: N/A  . Number of children: N/A  . Years of education: N/A   Occupational History  . Not on file.   Social History Main Topics  . Smoking status: Former Smoker    Quit date: 11/13/2008  . Smokeless tobacco: Never Used  . Alcohol use 3.6 oz/week    6 Cans of beer per week  . Drug use: Unknown  . Sexual activity: Not Currently   Other Topics Concern  . Not on file   Social History Narrative  . No narrative on file     Current Outpatient Prescriptions:  .  albuterol (PROVENTIL HFA;VENTOLIN HFA) 108 (90 Base) MCG/ACT inhaler, Inhale 1 puff into the lungs every 6 (six) hours as needed for wheezing or shortness of breath., Disp: 1 Inhaler, Rfl: 5 .  allopurinol (ZYLOPRIM) 100 MG tablet, Take 1 tablet (100 mg total) by mouth daily., Disp: 90 tablet, Rfl: 0 .  aspirin EC 81 MG tablet, Take 81 mg by mouth daily., Disp: , Rfl:  .  budesonide-formoterol (SYMBICORT) 160-4.5 MCG/ACT inhaler, Inhale 2 puffs into the lungs 2 (two) times daily., Disp: 1 Inhaler, Rfl: 5 .  enalapril (VASOTEC) 20 MG tablet,  Take 1 tablet (20 mg total) by mouth daily., Disp: 90 tablet, Rfl: 0 .  fluticasone (FLONASE) 50 MCG/ACT nasal spray, Place 2 sprays into both nostrils daily., Disp: 16 g, Rfl: 2 .  hydrochlorothiazide (MICROZIDE) 12.5 MG capsule, Take 1 capsule (12.5 mg total) by mouth daily., Disp: 90 capsule, Rfl: 0 .  lovastatin (MEVACOR) 40 MG tablet, Take 1 tablet (40 mg total) by mouth at bedtime., Disp: 90 tablet, Rfl: 0 .  meclizine (ANTIVERT) 25 MG tablet, TAKE ONE TABLET BY MOUTH THREE TIMES DAILY AS NEEDED FOR DIZZINESS, Disp: 30 tablet, Rfl: 0 .  naproxen (NAPROSYN) 500 MG tablet, Take 1 tablet (500 mg total) by mouth daily., Disp: 90 tablet, Rfl: 0 .   simethicone (GAS-X) 80 MG chewable tablet, Chew 1 tablet (80 mg total) by mouth 4 (four) times daily as needed for flatulence., Disp: 100 tablet, Rfl: 2 .  triamcinolone cream (KENALOG) 0.1 %, APPLY CREAM EXTERNALLY TO AFFECTED AREA TWICE DAILY, Disp: 80 g, Rfl: 0  No Known Allergies   Review of Systems  Eyes: Negative for blurred vision.  Cardiovascular: Negative for chest pain and palpitations.  Neurological: Negative for headaches.      Objective  Vitals:   10/18/16 0900  BP: 120/70  Pulse: (!) 111  Resp: 17  Temp: 97.9 F (36.6 C)  TempSrc: Oral  SpO2: 94%  Weight: 286 lb 9.6 oz (130 kg)  Height: 5\' 3"  (1.6 m)    Physical Exam  Constitutional: She is oriented to person, place, and time and well-developed, well-nourished, and in no distress.  HENT:  Head: Normocephalic and atraumatic.  Cardiovascular: Normal rate, regular rhythm and normal heart sounds.   No murmur heard. Pulmonary/Chest: Effort normal and breath sounds normal. No respiratory distress. She has no wheezes. She has no rales.  Abdominal: Soft. Bowel sounds are normal. There is no tenderness.  Neurological: She is alert and oriented to person, place, and time.  Psychiatric: Mood, memory, affect and judgment normal.  Nursing note and vitals reviewed.   Recent Results (from the past 2160 hour(s))  CBC with Differential/Platelet     Status: None   Collection Time: 10/04/16  9:51 AM  Result Value Ref Range   WBC 7.8 3.8 - 10.8 Thousand/uL   RBC 4.57 3.80 - 5.10 Million/uL   Hemoglobin 13.5 11.7 - 15.5 g/dL   HCT 40.3 35.0 - 45.0 %   MCV 88.2 80.0 - 100.0 fL   MCH 29.5 27.0 - 33.0 pg   MCHC 33.5 32.0 - 36.0 g/dL   RDW 12.1 11.0 - 15.0 %   Platelets 338 140 - 400 Thousand/uL   MPV 10.5 7.5 - 12.5 fL   Neutro Abs 3,736 1,500 - 7,800 cells/uL   Lymphs Abs 3,221 850 - 3,900 cells/uL   WBC mixed population 484 200 - 950 cells/uL   Eosinophils Absolute 265 15 - 500 cells/uL   Basophils Absolute 94 0 -  200 cells/uL   Neutrophils Relative % 47.9 %   Total Lymphocyte 41.3 %   Monocytes Relative 6.2 %   Eosinophils Relative 3.4 %   Basophils Relative 1.2 %  TSH     Status: None   Collection Time: 10/04/16  9:51 AM  Result Value Ref Range   TSH 2.79 0.40 - 4.50 mIU/L  VITAMIN D 25 Hydroxy (Vit-D Deficiency, Fractures)     Status: Abnormal   Collection Time: 10/04/16  9:51 AM  Result Value Ref Range   Vit D, 25-Hydroxy 29 (L)  30 - 100 ng/mL    Comment: Vitamin D Status         25-OH Vitamin D: . Deficiency:                    <20 ng/mL Insufficiency:             20 - 29 ng/mL Optimal:                 > or = 30 ng/mL . For 25-OH Vitamin D testing on patients on  D2-supplementation and patients for whom quantitation  of D2 and D3 fractions is required, the QuestAssureD(TM) 25-OH VIT D, (D2,D3), LC/MS/MS is recommended: order  code [phone] (patients >51yrs). . For more information on this test, go to: http://education.questdiagnostics.com/faq/FAQ163 (This link is being provided for  informational/educational purposes only.)   COMPLETE METABOLIC PANEL WITH GFR     Status: Abnormal   Collection Time: 10/04/16  9:51 AM  Result Value Ref Range   Glucose, Bld 96 65 - 99 mg/dL    Comment: .            Fasting reference interval .    BUN 21 7 - 25 mg/dL   Creat 1.04 (H) 0.60 - 0.93 mg/dL    Comment: For patients >44 years of age, the reference limit for Creatinine is approximately 13% higher for people identified as African-American. .    GFR, Est Non African American 54 (L) > OR = 60 mL/min/1.53m2   GFR, Est African American 63 > OR = 60 mL/min/1.68m2   BUN/Creatinine Ratio 20 6 - 22 (calc)   Sodium 137 135 - 146 mmol/L   Potassium 4.9 3.5 - 5.3 mmol/L   Chloride 100 98 - 110 mmol/L   CO2 27 20 - 32 mmol/L   Calcium 9.9 8.6 - 10.4 mg/dL   Total Protein 8.0 6.1 - 8.1 g/dL   Albumin 3.6 3.6 - 5.1 g/dL   Globulin 4.4 (H) 1.9 - 3.7 g/dL (calc)   AG Ratio 0.8 (L) 1.0 - 2.5 (calc)    Total Bilirubin 0.4 0.2 - 1.2 mg/dL   Alkaline phosphatase (APISO) 118 33 - 130 U/L   AST 15 10 - 35 U/L   ALT 7 6 - 29 U/L     Assessment & Plan  1. Benign essential HTN BP stable on present antihypertensive treatment - enalapril (VASOTEC) 20 MG tablet; Take 1 tablet (20 mg total) by mouth daily.  Dispense: 90 tablet; Refill: 0 - hydrochlorothiazide (MICROZIDE) 12.5 MG capsule; Take 1 capsule (12.5 mg total) by mouth daily.  Dispense: 90 capsule; Refill: 0  2. Chronic obstructive pulmonary disease, unspecified COPD type ([place]) On appropriate maintenance and rescue inhalers, refills provided - albuterol (PROVENTIL HFA;VENTOLIN HFA) 108 (90 Base) MCG/ACT inhaler; Inhale 1 puff into the lungs every 6 (six) hours as needed for wheezing or shortness of breath.  Dispense: 1 Inhaler; Refill: 5 - budesonide-formoterol (SYMBICORT) 160-4.5 MCG/ACT inhaler; Inhale 2 puffs into the lungs 2 (two) times daily.  Dispense: 1 Inhaler; Refill: 5  3. Idiopathic chronic gout of left foot without tophus No recent gout attacks, obtain uric acid levels, continue on allopurinol 100 mg daily - allopurinol (ZYLOPRIM) 100 MG tablet; Take 1 tablet (100 mg total) by mouth daily.  Dispense: 90 tablet; Refill: 0 - Uric acid  4. Primary osteoarthritis of both knees  - naproxen (NAPROSYN) 500 MG tablet; Take 1 tablet (500 mg total) by mouth daily.  Dispense: 90 tablet; Refill:  0  5. Pure hypercholesterolemia  - lovastatin (MEVACOR) 40 MG tablet; Take 1 tablet (40 mg total) by mouth at bedtime.  Dispense: 90 tablet; Refill: 0  6. Dermatitis of face  - triamcinolone cream (KENALOG) 0.1 %; APPLY CREAM EXTERNALLY TO AFFECTED AREA TWICE DAILY  Dispense: 80 g; Refill: 0  7. Elevated serum globulin level  - COMPLETE METABOLIC PANEL WITH GFR  Kinzey Sheriff [facility] 10/18/2016 9:12 AM

## 2016-10-19 ENCOUNTER — Other Ambulatory Visit: Payer: Self-pay

## 2016-10-19 ENCOUNTER — Other Ambulatory Visit: Payer: Self-pay | Admitting: Family Medicine

## 2016-10-19 DIAGNOSIS — E7800 Pure hypercholesterolemia, unspecified: Secondary | ICD-10-CM

## 2016-10-19 DIAGNOSIS — M10072 Idiopathic gout, left ankle and foot: Secondary | ICD-10-CM

## 2016-10-19 DIAGNOSIS — I10 Essential (primary) hypertension: Secondary | ICD-10-CM

## 2016-10-20 MED ORDER — MECLIZINE HCL 25 MG PO TABS: ORAL_TABLET | ORAL | 0 refills | Status: DC

## 2016-10-21 ENCOUNTER — Telehealth: Payer: Self-pay

## 2016-10-21 NOTE — Telephone Encounter (Signed)
Pt states she was suppose to get a Rx for vitamin D3 or OTC.

## 2016-10-22 ENCOUNTER — Other Ambulatory Visit: Payer: Self-pay | Admitting: Family Medicine

## 2016-10-22 DIAGNOSIS — R778 Other specified abnormalities of plasma proteins: Secondary | ICD-10-CM

## 2016-10-22 DIAGNOSIS — M1A0720 Idiopathic chronic gout, left ankle and foot, without tophus (tophi): Secondary | ICD-10-CM

## 2016-10-22 MED ORDER — ALLOPURINOL 100 MG PO TABS: 100.0000 mg | ORAL_TABLET | Freq: Two times a day (BID) | ORAL | 0 refills | Status: DC

## 2016-10-22 NOTE — Telephone Encounter (Signed)
Since her recent vitamin D level was 29 ng per mL, which is very close to normal of 30, she should continue taking OTC vitamin D 2000 IUs daily. Recheck levels in 3 months

## 2016-10-25 LAB — COLOGUARD: Cologuard: NEGATIVE

## 2016-10-25 NOTE — Telephone Encounter (Signed)
Pt notified of vitamin D3 2000 units daily and levels recheck in 3 months.

## 2016-10-26 ENCOUNTER — Encounter: Payer: Self-pay | Admitting: Family Medicine

## 2016-10-29 ENCOUNTER — Inpatient Hospital Stay: Payer: Medicare HMO | Attending: Oncology | Admitting: Oncology

## 2016-10-29 ENCOUNTER — Inpatient Hospital Stay: Payer: Medicare HMO

## 2016-10-29 ENCOUNTER — Encounter: Payer: Self-pay | Admitting: Oncology

## 2016-10-29 ENCOUNTER — Encounter: Payer: Self-pay | Admitting: Family Medicine

## 2016-10-29 VITALS — BP 130/80 | HR 68 | Temp 96.6°F | Wt 287.2 lb

## 2016-10-29 DIAGNOSIS — E8809 Other disorders of plasma-protein metabolism, not elsewhere classified: Secondary | ICD-10-CM

## 2016-10-29 DIAGNOSIS — J449 Chronic obstructive pulmonary disease, unspecified: Secondary | ICD-10-CM | POA: Diagnosis not present

## 2016-10-29 DIAGNOSIS — M1990 Unspecified osteoarthritis, unspecified site: Secondary | ICD-10-CM | POA: Insufficient documentation

## 2016-10-29 DIAGNOSIS — D892 Hypergammaglobulinemia, unspecified: Secondary | ICD-10-CM | POA: Diagnosis not present

## 2016-10-29 DIAGNOSIS — Z7982 Long term (current) use of aspirin: Secondary | ICD-10-CM | POA: Diagnosis not present

## 2016-10-29 DIAGNOSIS — M109 Gout, unspecified: Secondary | ICD-10-CM | POA: Insufficient documentation

## 2016-10-29 DIAGNOSIS — K219 Gastro-esophageal reflux disease without esophagitis: Secondary | ICD-10-CM | POA: Diagnosis not present

## 2016-10-29 DIAGNOSIS — R779 Abnormality of plasma protein, unspecified: Secondary | ICD-10-CM

## 2016-10-29 DIAGNOSIS — D649 Anemia, unspecified: Secondary | ICD-10-CM | POA: Diagnosis not present

## 2016-10-29 DIAGNOSIS — Z79899 Other long term (current) drug therapy: Secondary | ICD-10-CM | POA: Diagnosis not present

## 2016-10-29 DIAGNOSIS — Z87891 Personal history of nicotine dependence: Secondary | ICD-10-CM | POA: Diagnosis not present

## 2016-10-29 DIAGNOSIS — I10 Essential (primary) hypertension: Secondary | ICD-10-CM | POA: Diagnosis not present

## 2016-10-29 LAB — CBC WITH DIFFERENTIAL/PLATELET
BASOS ABS: 0.1 10*3/uL (ref 0–0.1)
BASOS PCT: 1 %
EOS ABS: 0.2 10*3/uL (ref 0–0.7)
Eosinophils Relative: 3 %
HEMATOCRIT: 39.9 % (ref 35.0–47.0)
Hemoglobin: 13.5 g/dL (ref 12.0–16.0)
Lymphocytes Relative: 41 %
Lymphs Abs: 3 10*3/uL (ref 1.0–3.6)
MCH: 30 pg (ref 26.0–34.0)
MCHC: 33.8 g/dL (ref 32.0–36.0)
MCV: 88.8 fL (ref 80.0–100.0)
Monocytes Absolute: 0.5 10*3/uL (ref 0.2–0.9)
Monocytes Relative: 7 %
NEUTROS ABS: 3.6 10*3/uL (ref 1.4–6.5)
NEUTROS PCT: 48 %
PLATELETS: 332 10*3/uL (ref 150–440)
RBC: 4.49 MIL/uL (ref 3.80–5.20)
RDW: 13.8 % (ref 11.5–14.5)
WBC: 7.3 10*3/uL (ref 3.6–11.0)

## 2016-10-29 LAB — COMPREHENSIVE METABOLIC PANEL
ALBUMIN: 3.8 g/dL (ref 3.5–5.0)
ALT: 12 U/L — ABNORMAL LOW (ref 14–54)
ANION GAP: 8 (ref 5–15)
AST: 20 U/L (ref 15–41)
Alkaline Phosphatase: 122 U/L (ref 38–126)
BUN: 25 mg/dL — AB (ref 6–20)
CHLORIDE: 98 mmol/L — AB (ref 101–111)
CO2: 28 mmol/L (ref 22–32)
Calcium: 9.9 mg/dL (ref 8.9–10.3)
Creatinine, Ser: 1.09 mg/dL — ABNORMAL HIGH (ref 0.44–1.00)
GFR calc Af Amer: 58 mL/min — ABNORMAL LOW (ref >60)
GFR calc non Af Amer: 50 mL/min — ABNORMAL LOW (ref >60)
GLUCOSE: 110 mg/dL — AB (ref 65–99)
POTASSIUM: 4.5 mmol/L (ref 3.5–5.1)
SODIUM: 134 mmol/L — AB (ref 135–145)
Total Bilirubin: 0.5 mg/dL (ref 0.3–1.2)
Total Protein: 8.8 g/dL — ABNORMAL HIGH (ref 6.5–8.1)

## 2016-10-29 LAB — LACTATE DEHYDROGENASE: LDH: 120 U/L (ref 98–192)

## 2016-10-29 NOTE — Progress Notes (Signed)
Patient here today as a new patient  

## 2016-10-29 NOTE — Progress Notes (Signed)
Hematology/Oncology Consult note [facility] [phone] [phone]  CONSULT NOTE Patient Care Team: Roselee Nova, MD as PCP - General (Family Medicine)  REFERRING PROVIDER: Roselee Nova, MD CHIEF COMPLAINTS/PURPOSE OF CONSULTATION:  Elevated blood protein  HISTORY OF PRESENTING ILLNESS:  71 y.o.  female with PMH listed below who was referred by Manuella Ghazi to me for evaluation of elevated protein. Patient tells me that she was going for a routine visit and she was told that there is abnormality and she was offered to wait and watch or being referred to me. She chose to be evaluated by [facility].  She has chronic knee pain, and may need surgery. Takes Naproxen 500mg  daily which helps relieve the pain.  Cologuard was negative on 10/20/2016   Review of Systems - Oncology Constitutional: Negative for fever, night sweats,unintentional weight loss, change in appetite. HENT: Negative for ear pain, hearing loss, nasal bleeding Eyes: Negative for eye pain, double vision   Respiratory: Negative for wheezing, shortness of breath, cough Cardiovascular: Negative for chest pain, palpitation.   Gastrointestinal: Negative abdominal pain, diarrhea, nausea vomiting Endocrine: Negative  Genitourinary: Negative for dysuria, hematuria, frequency Skin: Negative for rash, iching, bruising Neurological: Negative for headache, dizziness, seizure Hematological: Negative for easy bruising/bleeding, lymph node enlargement Psychiatric/Behavioral: Negative for depression, anxiety, suicidality Musculoskeletal: chronic knee pain.  MEDICAL HISTORY:  Past Medical History:  Diagnosis Date  . Asthma   . Cataract   . COPD (chronic obstructive pulmonary disease) ([place])   . GERD (gastroesophageal reflux disease)   . Gout   . Hyperlipidemia   . Hypertension   . Osteoarthrosis     SURGICAL HISTORY: Past Surgical History:  Procedure Laterality Date  . LUMBAR  LAMINECTOMY    . TONSILLECTOMY      SOCIAL HISTORY: Social History   Social History  . Marital status: Married    Spouse name: N/A  . Number of children: N/A  . Years of education: N/A   Occupational History  . Not on file.   Social History Main Topics  . Smoking status: Former Smoker    Quit date: 11/13/2008  . Smokeless tobacco: Never Used  . Alcohol use 3.6 oz/week    6 Cans of beer per week  . Drug use: Unknown  . Sexual activity: Not Currently   Other Topics Concern  . Not on file   Social History Narrative  . No narrative on file    FAMILY HISTORY: Family History  Problem Relation Age of Onset  . Diabetes Mother   . Lung cancer Father   . Diabetes Sister   . Congestive Heart Failure Sister   . Glaucoma Sister     ALLERGIES:  has No Known Allergies.  MEDICATIONS:  Current Outpatient Prescriptions  Medication Sig Dispense Refill  . albuterol (PROVENTIL HFA;VENTOLIN HFA) 108 (90 Base) MCG/ACT inhaler Inhale 1 puff into the lungs every 6 (six) hours as needed for wheezing or shortness of breath. 1 Inhaler 5  . allopurinol (ZYLOPRIM) 100 MG tablet Take 1 tablet (100 mg total) by mouth 2 (two) times daily. 180 tablet 0  . aspirin EC 81 MG tablet Take 81 mg by mouth daily.    . budesonide-formoterol (SYMBICORT) 160-4.5 MCG/ACT inhaler Inhale 2 puffs into the lungs 2 (two) times daily. 1 Inhaler 5  . enalapril (VASOTEC) 20 MG tablet Take 1 tablet (20 mg total) by mouth daily. 90 tablet 0  . fluticasone (FLONASE) 50 MCG/ACT nasal spray Place 2 sprays  into both nostrils daily. 16 g 2  . hydrochlorothiazide (MICROZIDE) 12.5 MG capsule Take 1 capsule (12.5 mg total) by mouth daily. 90 capsule 0  . lovastatin (MEVACOR) 40 MG tablet Take 1 tablet (40 mg total) by mouth at bedtime. 90 tablet 0  . meclizine (ANTIVERT) 25 MG tablet TAKE ONE TABLET BY MOUTH THREE TIMES DAILY AS NEEDED FOR DIZZINESS 30 tablet 0  . naproxen (NAPROSYN) 500 MG tablet Take 1 tablet (500 mg  total) by mouth daily. 90 tablet 0  . simethicone (GAS-X) 80 MG chewable tablet Chew 1 tablet (80 mg total) by mouth 4 (four) times daily as needed for flatulence. 100 tablet 2  . triamcinolone cream (KENALOG) 0.1 % APPLY CREAM EXTERNALLY TO AFFECTED AREA TWICE DAILY 80 g 0   No current facility-administered medications for this visit.       Marland Kitchen  PHYSICAL EXAMINATION: ECOG PERFORMANCE STATUS: 1 - Symptomatic but completely ambulatory Vitals:   10/29/16 0950  BP: 130/80  Pulse: 68  Temp: (!) 96.6 F (35.9 C)   Filed Weights   10/29/16 0950  Weight: 287 lb 4 oz (130.3 kg)    GENERAL:Alert, no distress and comfortable.  EYES: no pallor or icterus OROPHARYNX: no thrush or ulceration;  NECK: supple, no masses felt LYMPH:  no palpable lymphadenopathy in the cervical, axillary or inguinal regions LUNGS: clear to auscultation and  No wheeze or crackles HEART/CVS: regular rate & rhythm and no murmurs; No lower extremity edema ABDOMEN: abdomen soft, non-tender and normal bowel sounds Musculoskeletal:no cyanosis of digits and no clubbing  PSYCH: alert & oriented x 3  NEURO: no focal motor/sensory deficits SKIN:  no rashes or significant lesions  LABORATORY DATA:  I have reviewed the data as listed Lab Results  Component Value Date   WBC 7.8 10/04/2016   HGB 13.5 10/04/2016   HCT 40.3 10/04/2016   MCV 88.2 10/04/2016   PLT 338 10/04/2016    Recent Labs  12/31/15 0922 06/04/16 1550 06/04/16 1944 10/04/16 0951 10/18/16 0940  NA 138 134*  --  137 134*  K 4.4 3.9  --  4.9 5.2  CL 101 100*  --  100 97*  CO2 28 25  --  27 29  GLUCOSE 97 97  --  96 104*  BUN 20 24*  --  21 28*  CREATININE 0.97 1.03*  --  1.04* 1.12*  CALCIUM 10.0 9.4  --  9.9 9.8  GFRNONAA 60 54*  --  54* 50*  GFRAA 69 >60  --  63 58*  PROT 7.8  --  8.4* 8.0 8.4*  ALBUMIN 3.7  --  3.6  --   --   AST 20  --  19 15 17   ALT 10  --  12* 7 10  ALKPHOS 102  --  101  --   --   BILITOT 0.5  --  0.6 0.4 0.5    BILIDIR  --   --  <0.1*  --   --   IBILI  --   --  NOT CALCULATED  --   --     RADIOGRAPHIC STUDIES: I have personally reviewed the radiological images as listed and agreed with the findings in the report. Chest X ray 06/04/2016 IMPRESSION: No acute abnormality.  ASSESSMENT & PLAN:  1. Elevated blood protein    Discussed with patient that her serum protein especially globulin was elevated, this can be a result of either reactive vs malignant process and need further work  up. Patient feels upset as she told me when scheduler called her for an appointment with me she was provided information to give her an impression that she is here for a b12 shots.  Explained to patient in detail about the reason why she is here for further evaluation and will have her come back in 1 week to discuss tests. Patient voices understanding. Check SPEP, immunofixation, LDH, kappa/lamda light chain ratio,   All questions were answered. The patient knows to call the clinic with any problems questions or concerns.  Return of visit: 1 week Thank you for this kind referral and the opportunity to participate in the care of this patient. A copy of today's note is routed to referring provider    Earlie Server, MD, PhD Hematology Oncology [facility] at [facility] Pager- [phone] 10/29/2016

## 2016-10-30 ENCOUNTER — Encounter: Payer: Self-pay | Admitting: Oncology

## 2016-10-31 LAB — IMMUNOGLOBULINS A/E/G/M, SERUM
IGE (IMMUNOGLOBULIN E), SERUM: 24 [IU]/mL (ref 0–100)
IgA: 468 mg/dL — ABNORMAL HIGH (ref 87–352)
IgG (Immunoglobin G), Serum: 2058 mg/dL — ABNORMAL HIGH (ref 700–1600)
IgM (Immunoglobulin M), Srm: 55 mg/dL (ref 26–217)

## 2016-11-01 LAB — KAPPA/LAMBDA LIGHT CHAINS
KAPPA FREE LGHT CHN: 47.8 mg/L — AB (ref 3.3–19.4)
KAPPA, LAMDA LIGHT CHAIN RATIO: 1.15 (ref 0.26–1.65)
LAMDA FREE LIGHT CHAINS: 41.6 mg/L — AB (ref 5.7–26.3)

## 2016-11-01 LAB — PROTEIN ELECTROPHORESIS, SERUM
A/G RATIO SPE: 0.7 (ref 0.7–1.7)
ALBUMIN ELP: 3.2 g/dL (ref 2.9–4.4)
ALPHA-1-GLOBULIN: 0.3 g/dL (ref 0.0–0.4)
Alpha-2-Globulin: 0.9 g/dL (ref 0.4–1.0)
Beta Globulin: 1.2 g/dL (ref 0.7–1.3)
GAMMA GLOBULIN: 2.4 g/dL — AB (ref 0.4–1.8)
GLOBULIN, TOTAL: 4.8 g/dL — AB (ref 2.2–3.9)
TOTAL PROTEIN ELP: 8 g/dL (ref 6.0–8.5)

## 2016-11-05 ENCOUNTER — Encounter: Payer: Self-pay | Admitting: Oncology

## 2016-11-05 ENCOUNTER — Inpatient Hospital Stay (HOSPITAL_BASED_OUTPATIENT_CLINIC_OR_DEPARTMENT_OTHER): Payer: Medicare HMO | Admitting: Oncology

## 2016-11-05 VITALS — BP 105/72 | HR 77 | Temp 97.3°F | Resp 18 | Ht 63.0 in | Wt 286.3 lb

## 2016-11-05 DIAGNOSIS — E8809 Other disorders of plasma-protein metabolism, not elsewhere classified: Secondary | ICD-10-CM

## 2016-11-05 DIAGNOSIS — D649 Anemia, unspecified: Secondary | ICD-10-CM | POA: Diagnosis not present

## 2016-11-05 DIAGNOSIS — D892 Hypergammaglobulinemia, unspecified: Secondary | ICD-10-CM

## 2016-11-05 DIAGNOSIS — Z79899 Other long term (current) drug therapy: Secondary | ICD-10-CM

## 2016-11-05 NOTE — Progress Notes (Signed)
Hematology/Oncology Follow Up note [facility] [phone] [phone]  CONSULT NOTE Patient Care Team: Roselee Nova, MD as PCP - General (Family Medicine)  REFERRING PROVIDER: Roselee Nova, MD CHIEF COMPLAINTS/PURPOSE OF CONSULTATION:  Follow up of Elevated blood protein  HISTORY OF PRESENTING ILLNESS:  71 y.o.  female with PMH listed below who was presents for follow up of  evaluation of elevated protein. Cologuard was negative on 10/20/2016    Review of Systems - Oncology  Review of Systems  Constitutional: Negative for fever, night sweats,unintentional weight loss, change in appetite. HENT: Negative for ear pain, hearing loss, nasal bleeding Eyes: Negative for eye pain, double vision   Respiratory: Negative for wheezing, shortness of breath, cough Cardiovascular: Negative for chest pain, palpitation.   Gastrointestinal: Negative abdominal pain, diarrhea, nausea vomiting Endocrine: Negative  Genitourinary: Negative for dysuria, hematuria, frequency Skin: Negative for rash, iching, bruising Neurological: Negative for headache, dizziness, seizure Hematological: Negative for easy bruising/bleeding, lymph node enlargement Psychiatric/Behavioral: Negative for depression, anxiety, suicidality Musculoskeletal: chronic knee pain, neck pain due to arthritis. MEDICAL HISTORY:  Past Medical History:  Diagnosis Date  . Asthma   . Cataract   . COPD (chronic obstructive pulmonary disease) ([place])   . GERD (gastroesophageal reflux disease)   . Gout   . Hyperlipidemia   . Hypertension   . Osteoarthrosis     SURGICAL HISTORY: Past Surgical History:  Procedure Laterality Date  . LUMBAR LAMINECTOMY    . TONSILLECTOMY      SOCIAL HISTORY: Social History   Social History  . Marital status: Married    Spouse name: N/A  . Number of children: N/A  . Years of education: N/A   Occupational History  . Not on file.   Social  History Main Topics  . Smoking status: Former Smoker    Quit date: 11/13/2008  . Smokeless tobacco: Never Used  . Alcohol use 3.6 oz/week    6 Cans of beer per week     Comment: 6 beers a week sometimes  . Drug use: No  . Sexual activity: Not Currently   Other Topics Concern  . Not on file   Social History Narrative  . No narrative on file    FAMILY HISTORY: Family History  Problem Relation Age of Onset  . Diabetes Mother   . Lung cancer Father   . Diabetes Sister   . Congestive Heart Failure Sister   . Glaucoma Sister     ALLERGIES:  has No Known Allergies.  MEDICATIONS:  Current Outpatient Prescriptions  Medication Sig Dispense Refill  . albuterol (PROVENTIL HFA;VENTOLIN HFA) 108 (90 Base) MCG/ACT inhaler Inhale 1 puff into the lungs every 6 (six) hours as needed for wheezing or shortness of breath. 1 Inhaler 5  . allopurinol (ZYLOPRIM) 100 MG tablet Take 1 tablet (100 mg total) by mouth 2 (two) times daily. 180 tablet 0  . aspirin EC 81 MG tablet Take 81 mg by mouth daily.    . budesonide-formoterol (SYMBICORT) 160-4.5 MCG/ACT inhaler Inhale 2 puffs into the lungs 2 (two) times daily. (Patient taking differently: Inhale 2 puffs into the lungs daily. ) 1 Inhaler 5  . enalapril (VASOTEC) 20 MG tablet Take 1 tablet (20 mg total) by mouth daily. 90 tablet 0  . fluticasone (FLONASE) 50 MCG/ACT nasal spray Place 2 sprays into both nostrils daily. (Patient taking differently: Place 2 sprays into both nostrils daily as needed. ) 16 g 2  . hydrochlorothiazide (MICROZIDE)  12.5 MG capsule Take 1 capsule (12.5 mg total) by mouth daily. 90 capsule 0  . lovastatin (MEVACOR) 40 MG tablet Take 1 tablet (40 mg total) by mouth at bedtime. 90 tablet 0  . meclizine (ANTIVERT) 25 MG tablet TAKE ONE TABLET BY MOUTH THREE TIMES DAILY AS NEEDED FOR DIZZINESS 30 tablet 0  . naproxen (NAPROSYN) 500 MG tablet Take 1 tablet (500 mg total) by mouth daily. 90 tablet 0  . simethicone (GAS-X) 80 MG  chewable tablet Chew 1 tablet (80 mg total) by mouth 4 (four) times daily as needed for flatulence. 100 tablet 2  . triamcinolone cream (KENALOG) 0.1 % APPLY CREAM EXTERNALLY TO AFFECTED AREA TWICE DAILY 80 g 0   No current facility-administered medications for this visit.       Marland Kitchen  PHYSICAL EXAMINATION: ECOG PERFORMANCE STATUS: 1 - Symptomatic but completely ambulatory Vitals:   11/05/16 0826  BP: 105/72  Pulse: 77  Resp: 18  Temp: (!) 97.3 F (36.3 C)   Filed Weights   11/05/16 0826  Weight: 286 lb 4.8 oz (129.9 kg)   GENERAL: No distress, well nourished. obese SKIN:  No rashes or significant lesions  HEAD: Normocephalic, No masses, lesions, tenderness or abnormalities  EYES: Conjunctiva are pink, non icteric ENT: External ears normal ,lips , buccal mucosa, and tongue normal and mucous membranes are moist  LYMPH: No palpable cervical and axillary lymphadenopathy  LUNGS: Clear to auscultation, no crackles or wheezes HEART: Regular rate & rhythm, no murmurs, no gallops, S1 normal and S2 normal  ABDOMEN: Abdomen soft, non-tender, normal bowel sounds, MUSCULOSKELETAL: No CVA tenderness and no tenderness on percussion of the back or rib cage.  EXTREMITIES: No edema, no skin discoloration or tenderness NEURO: Alert & oriented, no focal motor/sensory deficits.  LABORATORY DATA:  I have reviewed the data as listed Lab Results  Component Value Date   WBC 7.3 10/29/2016   HGB 13.5 10/29/2016   HCT 39.9 10/29/2016   MCV 88.8 10/29/2016   PLT 332 10/29/2016    Recent Labs  12/31/15 0922  06/04/16 1944 10/04/16 0951 10/18/16 0940 10/29/16 1105  NA 138  < >  --  137 134* 134*  K 4.4  < >  --  4.9 5.2 4.5  CL 101  < >  --  100 97* 98*  CO2 28  < >  --  27 29 28   GLUCOSE 97  < >  --  96 104* 110*  BUN 20  < >  --  21 28* 25*  CREATININE 0.97  < >  --  1.04* 1.12* 1.09*  CALCIUM 10.0  < >  --  9.9 9.8 9.9  GFRNONAA 60  < >  --  54* 50* 50*  GFRAA 69  < >  --  63 58* 58*   PROT 7.8  --  8.4* 8.0 8.4* 8.8*  ALBUMIN 3.7  --  3.6  --   --  3.8  AST 20  --  19 15 17 20   ALT 10  --  12* 7 10 12*  ALKPHOS 102  --  101  --   --  122  BILITOT 0.5  --  0.6 0.4 0.5 0.5  BILIDIR  --   --  <0.1*  --   --   --   IBILI  --   --  NOT CALCULATED  --   --   --   < > = values in this interval not displayed.  RADIOGRAPHIC STUDIES: I have personally reviewed the radiological images as listed and agreed with the findings in the report. Chest X ray 06/04/2016 IMPRESSION: No acute abnormality.  ASSESSMENT & PLAN:  1. Hypergammaglobulinemia    I discussed with patient that her serum protein especially globulin was elevated, from the test that I done for her they show that these immunoglobulins are polyclonal, instead of monoclonal  Polyclonal hyper gamma globulin anemia can be a result of wide differential diagnosis, including chronic infection, autoimmune disease .With further questioning patient did report that she has dry mouth intermittently, and also she has chronic knee pain and neck pain which she contributes to chronic arthritis. In the morning she does have some knee stiffness. I will refer patient to see Dr. Meda Coffee rheumatologist for a basic workup of autoimmune disease. I offer patient follow-up with me in 3 months to discuss her progress. The patient preferred to go through rheumatology workup and then call us if needed All questions were answered. The patient knows to call the clinic with any problems questions or concerns.  Return of visit: as needed Thank you for this kind referral and the opportunity to participate in the care of this patient. A copy of today's note is routed to referring provider    Earlie Server, MD, PhD Hematology Oncology [facility] Pager- [phone] 11/05/2016

## 2016-11-05 NOTE — Progress Notes (Signed)
Here to get lab results 

## 2016-11-09 ENCOUNTER — Ambulatory Visit
Admission: RE | Admit: 2016-11-09 | Discharge: 2016-11-09 | Disposition: A | Discharge status: Home / Self Care | Payer: Medicare HMO | Source: Ambulatory Visit | Attending: Family Medicine | Admitting: Family Medicine

## 2016-11-09 DIAGNOSIS — D242 Benign neoplasm of left breast: Secondary | ICD-10-CM

## 2016-11-29 DIAGNOSIS — D892 Hypergammaglobulinemia, unspecified: Secondary | ICD-10-CM | POA: Insufficient documentation

## 2016-11-29 DIAGNOSIS — M170 Bilateral primary osteoarthritis of knee: Secondary | ICD-10-CM | POA: Insufficient documentation

## 2016-11-29 DIAGNOSIS — M1A00X0 Idiopathic chronic gout, unspecified site, without tophus (tophi): Secondary | ICD-10-CM | POA: Insufficient documentation

## 2016-12-22 ENCOUNTER — Ambulatory Visit (INDEPENDENT_AMBULATORY_CARE_PROVIDER_SITE_OTHER): Payer: Medicare HMO | Admitting: Family Medicine

## 2016-12-22 ENCOUNTER — Encounter: Payer: Self-pay | Admitting: Family Medicine

## 2016-12-22 ENCOUNTER — Telehealth: Payer: Self-pay | Admitting: Family Medicine

## 2016-12-22 VITALS — BP 100/60 | HR 85 | Temp 98.1°F | Resp 20 | Wt 287.0 lb

## 2016-12-22 DIAGNOSIS — R059 Cough, unspecified: Secondary | ICD-10-CM

## 2016-12-22 DIAGNOSIS — R7303 Prediabetes: Secondary | ICD-10-CM

## 2016-12-22 DIAGNOSIS — J441 Chronic obstructive pulmonary disease with (acute) exacerbation: Secondary | ICD-10-CM

## 2016-12-22 DIAGNOSIS — R05 Cough: Secondary | ICD-10-CM

## 2016-12-22 DIAGNOSIS — J302 Other seasonal allergic rhinitis: Secondary | ICD-10-CM

## 2016-12-22 MED ORDER — PREDNISONE 20 MG PO TABS: 20.0000 mg | ORAL_TABLET | Freq: Two times a day (BID) | ORAL | 0 refills | Status: AC

## 2016-12-22 MED ORDER — BENZONATATE 100 MG PO CAPS: 100.0000 mg | ORAL_CAPSULE | Freq: Three times a day (TID) | ORAL | 0 refills | Status: DC | PRN

## 2016-12-22 MED ORDER — AZITHROMYCIN 250 MG PO TABS: ORAL_TABLET | ORAL | 0 refills | Status: AC

## 2016-12-22 NOTE — Progress Notes (Signed)
Name: Colleen [name]   MRN: [id number]    DOB: [birth year]   Date:12/22/2016       Progress Note  Subjective  Chief Complaint  Chief Complaint  Patient presents with  . URI    HPI  Pt presents with 2 weeks of nasal congestion and somewhat productive cough. Had some shortness of breath (has COPD) this morning and used her albuterol inhaler this morning which helped her.  Denies chest pain, fevers/chills, body aches,  Takes Symbicort daily; has albuterol PRN for shortness of breath.  Has been using flonase daily this week. Borderline DM - last glucose on 10/29/16 was 110 (unclear if this was fasting or not).  Patient Active Problem List   Diagnosis Date Noted  . Other allergic rhinitis 01/10/2015  . Allergic rhinitis, seasonal 07/30/2014  . Airway hyperreactivity 07/30/2014  . Elevated blood sugar 07/30/2014  . Gout 07/30/2014  . HLD (hyperlipidemia) 07/30/2014  . Encounter for immunization 07/30/2014  . Arthritis, degenerative 07/30/2014  . Borderline diabetes 07/30/2014  . Asthma, mild persistent 07/30/2014  . COPD (chronic obstructive pulmonary disease) ([place]) 07/09/2008  . Benign essential HTN 06/22/2006    Social History   Tobacco Use  . Smoking status: Former Smoker    Last attempt to quit: 11/13/2008    Years since quitting: 8.1  . Smokeless tobacco: Never Used  Substance Use Topics  . Alcohol use: Yes    Alcohol/week: 3.6 oz    Types: 6 Cans of beer per week    Comment: 6 beers a week sometimes     Current Outpatient Medications:  .  albuterol (PROVENTIL HFA;VENTOLIN HFA) 108 (90 Base) MCG/ACT inhaler, Inhale 1 puff into the lungs every 6 (six) hours as needed for wheezing or shortness of breath., Disp: 1 Inhaler, Rfl: 5 .  allopurinol (ZYLOPRIM) 100 MG tablet, Take 1 tablet (100 mg total) by mouth 2 (two) times daily., Disp: 180 tablet, Rfl: 0 .  aspirin EC 81 MG tablet, Take 81 mg by mouth daily., Disp: , Rfl:  .  azithromycin (ZITHROMAX) 250 MG tablet, Take  2 tablets on day 1; Take 1 tablet daily for days 2-5, Disp: 6 each, Rfl: 0 .  benzonatate (TESSALON) 100 MG capsule, Take 1-2 capsules (100-200 mg total) by mouth 3 (three) times daily as needed for cough., Disp: 30 capsule, Rfl: 0 .  budesonide-formoterol (SYMBICORT) 160-4.5 MCG/ACT inhaler, Inhale 2 puffs into the lungs 2 (two) times daily. (Patient taking differently: Inhale 2 puffs into the lungs daily. ), Disp: 1 Inhaler, Rfl: 5 .  enalapril (VASOTEC) 20 MG tablet, Take 1 tablet (20 mg total) by mouth daily., Disp: 90 tablet, Rfl: 0 .  fluticasone (FLONASE) 50 MCG/ACT nasal spray, Place 2 sprays into both nostrils daily. (Patient taking differently: Place 2 sprays into both nostrils daily as needed. ), Disp: 16 g, Rfl: 2 .  hydrochlorothiazide (MICROZIDE) 12.5 MG capsule, Take 1 capsule (12.5 mg total) by mouth daily., Disp: 90 capsule, Rfl: 0 .  lovastatin (MEVACOR) 40 MG tablet, Take 1 tablet (40 mg total) by mouth at bedtime., Disp: 90 tablet, Rfl: 0 .  meclizine (ANTIVERT) 25 MG tablet, TAKE ONE TABLET BY MOUTH THREE TIMES DAILY AS NEEDED FOR DIZZINESS, Disp: 30 tablet, Rfl: 0 .  naproxen (NAPROSYN) 500 MG tablet, Take 1 tablet (500 mg total) by mouth daily., Disp: 90 tablet, Rfl: 0 .  predniSONE (DELTASONE) 20 MG tablet, Take 1 tablet (20 mg total) by mouth 2 (two) times daily with a meal  for 5 days. 36m (2 tablets) twice daily (breakfast and lunch), Disp: 10 tablet, Rfl: 0 .  simethicone (GAS-X) 80 MG chewable tablet, Chew 1 tablet (80 mg total) by mouth 4 (four) times daily as needed for flatulence., Disp: 100 tablet, Rfl: 2 .  triamcinolone cream (KENALOG) 0.1 %, APPLY CREAM EXTERNALLY TO AFFECTED AREA TWICE DAILY, Disp: 80 g, Rfl: 0  No Known Allergies  ROS  Constitutional: Negative for fever or weight change.  Respiratory: Positive for cough and shortness of breath.   Cardiovascular: Negative for chest pain or palpitations.  Gastrointestinal: Negative for abdominal pain, no bowel  changes.  Musculoskeletal: Negative for gait problem or joint swelling.  Skin: Negative for rash.  Neurological: Negative for dizziness or headache.  No other specific complaints in a complete review of systems (except as listed in HPI above).  Objective  Vitals:   12/22/16 1133 12/22/16 1157  BP: 100/60   Pulse: (!) 113 85  Resp: 14 20  Temp: (!) 97.1 F (36.2 C) 98.1 F (36.7 C)  TempSrc: Oral Oral  SpO2: 96% 99%  Weight: 287 lb (130.2 kg)    Body mass index is 50.84 kg/m.  Nursing Note and Vital Signs reviewed. Vitals improved on recheck  Physical Exam  Constitutional: Patient appears well-developed and well-nourished. Obese No distress.  HEENT: head atraumatic, normocephalic, pupils equal and reactive to light, EOM's intact, TM's without erythema or bulging, no maxillary or frontal sinus pain on palpation, neck supple without lymphadenopathy, oropharynx pink and moist without exudate.  Bilateral turbinates inflamed Cardiovascular: Normal rate, regular rhythm, S1/S2 present.  No murmur or rub heard. No BLE edema. Pulmonary/Chest: Effort normal and breath sounds slightly diminished throughout with no crackles or wheezes. No respiratory distress or retractions. Psychiatric: Patient has a normal mood and affect. behavior is normal. Judgment and thought content normal.  Recent Results (from the past 2160 hour(s))  CBC with Differential/Platelet     Status: None   Collection Time: 10/04/16  9:51 AM  Result Value Ref Range   WBC 7.8 3.8 - 10.8 Thousand/uL   RBC 4.57 3.80 - 5.10 Million/uL   Hemoglobin 13.5 11.7 - 15.5 g/dL   HCT 40.3 35.0 - 45.0 %   MCV 88.2 80.0 - 100.0 fL   MCH 29.5 27.0 - 33.0 pg   MCHC 33.5 32.0 - 36.0 g/dL   RDW 12.1 11.0 - 15.0 %   Platelets 338 140 - 400 Thousand/uL   MPV 10.5 7.5 - 12.5 fL   Neutro Abs 3,736 1,500 - 7,800 cells/uL   Lymphs Abs 3,221 850 - 3,900 cells/uL   WBC mixed population 484 200 - 950 cells/uL   Eosinophils Absolute 265 15  - 500 cells/uL   Basophils Absolute 94 0 - 200 cells/uL   Neutrophils Relative % 47.9 %   Total Lymphocyte 41.3 %   Monocytes Relative 6.2 %   Eosinophils Relative 3.4 %   Basophils Relative 1.2 %  TSH     Status: None   Collection Time: 10/04/16  9:51 AM  Result Value Ref Range   TSH 2.79 0.40 - 4.50 mIU/L  VITAMIN D 25 Hydroxy (Vit-D Deficiency, Fractures)     Status: Abnormal   Collection Time: 10/04/16  9:51 AM  Result Value Ref Range   Vit D, 25-Hydroxy 29 (L) 30 - 100 ng/mL    Comment: Vitamin D Status         25-OH Vitamin D: . Deficiency:                    <  20 ng/mL Insufficiency:             20 - 29 ng/mL Optimal:                 > or = 30 ng/mL . For 25-OH Vitamin D testing on patients on  D2-supplementation and patients for whom quantitation  of D2 and D3 fractions is required, the QuestAssureD(TM) 25-OH VIT D, (D2,D3), LC/MS/MS is recommended: order  code [id number] (patients >34yr). . For more information on this test, go to: http://education.questdiagnostics.com/faq/FAQ163 (This link is being provided for  informational/educational purposes only.)   COMPLETE METABOLIC PANEL WITH GFR     Status: Abnormal   Collection Time: 10/04/16  9:51 AM  Result Value Ref Range   Glucose, Bld 96 65 - 99 mg/dL    Comment: .            Fasting reference interval .    BUN 21 7 - 25 mg/dL   Creat 1.04 (H) 0.60 - 0.93 mg/dL    Comment: For patients >448years of age, the reference limit for Creatinine is approximately 13% higher for people identified as African-American. .    GFR, Est Non African American 54 (L) > OR = 60 mL/min/1.741m  GFR, Est African American 63 > OR = 60 mL/min/1.7329m BUN/Creatinine Ratio 20 6 - 22 (calc)   Sodium 137 135 - 146 mmol/L   Potassium 4.9 3.5 - 5.3 mmol/L   Chloride 100 98 - 110 mmol/L   CO2 27 20 - 32 mmol/L   Calcium 9.9 8.6 - 10.4 mg/dL   Total Protein 8.0 6.1 - 8.1 g/dL   Albumin 3.6 3.6 - 5.1 g/dL   Globulin 4.4 (H) 1.9 - 3.7 g/dL  (calc)   AG Ratio 0.8 (L) 1.0 - 2.5 (calc)   Total Bilirubin 0.4 0.2 - 1.2 mg/dL   Alkaline phosphatase (APISO) 118 33 - 130 U/L   AST 15 10 - 35 U/L   ALT 7 6 - 29 U/L  Uric acid     Status: Abnormal   Collection Time: 10/18/16  9:39 AM  Result Value Ref Range   Uric Acid, Serum 7.1 (H) 2.5 - 7.0 mg/dL    Comment: Therapeutic target for gout patients: <6.0 mg/dL .   COMPLETE METABOLIC PANEL WITH GFR     Status: Abnormal   Collection Time: 10/18/16  9:40 AM  Result Value Ref Range   Glucose, Bld 104 (H) 65 - 99 mg/dL    Comment: .            Fasting reference interval . For someone without known diabetes, a glucose value between 100 and 125 mg/dL is consistent with prediabetes and should be confirmed with a follow-up test. .    BUN 28 (H) 7 - 25 mg/dL   Creat 1.12 (H) 0.60 - 0.93 mg/dL    Comment: For patients >49 67ars of age, the reference limit for Creatinine is approximately 13% higher for people identified as African-American. .    GFR, Est Non African American 50 (L) > OR = 60 mL/min/1.72m71mGFR, Est African American 58 (L) > OR = 60 mL/min/1.72m268mUN/Creatinine Ratio 25 (H) 6 - 22 (calc)   Sodium 134 (L) 135 - 146 mmol/L   Potassium 5.2 3.5 - 5.3 mmol/L   Chloride 97 (L) 98 - 110 mmol/L   CO2 29 20 - 32 mmol/L   Calcium 9.8 8.6 - 10.4 mg/dL  Total Protein 8.4 (H) 6.1 - 8.1 g/dL   Albumin 3.8 3.6 - 5.1 g/dL   Globulin 4.6 (H) 1.9 - 3.7 g/dL (calc)   AG Ratio 0.8 (L) 1.0 - 2.5 (calc)   Total Bilirubin 0.5 0.2 - 1.2 mg/dL   Alkaline phosphatase (APISO) 125 33 - 130 U/L   AST 17 10 - 35 U/L   ALT 10 6 - 29 U/L  Cologuard     Status: None   Collection Time: 10/20/16 12:00 AM  Result Value Ref Range   Cologuard Negative   CBC with Differential/Platelet     Status: None   Collection Time: 10/29/16 11:05 AM  Result Value Ref Range   WBC 7.3 3.6 - 11.0 K/uL   RBC 4.49 3.80 - 5.20 MIL/uL   Hemoglobin 13.5 12.0 - 16.0 g/dL   HCT 39.9 35.0 - 47.0 %   MCV  88.8 80.0 - 100.0 fL   MCH 30.0 26.0 - 34.0 pg   MCHC 33.8 32.0 - 36.0 g/dL   RDW 13.8 11.5 - 14.5 %   Platelets 332 150 - 440 K/uL   Neutrophils Relative % 48 %   Neutro Abs 3.6 1.4 - 6.5 K/uL   Lymphocytes Relative 41 %   Lymphs Abs 3.0 1.0 - 3.6 K/uL   Monocytes Relative 7 %   Monocytes Absolute 0.5 0.2 - 0.9 K/uL   Eosinophils Relative 3 %   Eosinophils Absolute 0.2 0 - 0.7 K/uL   Basophils Relative 1 %   Basophils Absolute 0.1 0 - 0.1 K/uL  Comprehensive metabolic panel     Status: Abnormal   Collection Time: 10/29/16 11:05 AM  Result Value Ref Range   Sodium 134 (L) 135 - 145 mmol/L   Potassium 4.5 3.5 - 5.1 mmol/L   Chloride 98 (L) 101 - 111 mmol/L   CO2 28 22 - 32 mmol/L   Glucose, Bld 110 (H) 65 - 99 mg/dL   BUN 25 (H) 6 - 20 mg/dL   Creatinine, Ser 1.09 (H) 0.44 - 1.00 mg/dL   Calcium 9.9 8.9 - 10.3 mg/dL   Total Protein 8.8 (H) 6.5 - 8.1 g/dL   Albumin 3.8 3.5 - 5.0 g/dL   AST 20 15 - 41 U/L   ALT 12 (L) 14 - 54 U/L   Alkaline Phosphatase 122 38 - 126 U/L   Total Bilirubin 0.5 0.3 - 1.2 mg/dL   GFR calc non Af Amer 50 (L) >60 mL/min   GFR calc Af Amer 58 (L) >60 mL/min    Comment: (NOTE) The eGFR has been calculated using the CKD EPI equation. This calculation has not been validated in all clinical situations. eGFR's persistently <60 mL/min signify possible Chronic Kidney Disease.    Anion gap 8 5 - 15  Protein electrophoresis, serum     Status: Abnormal   Collection Time: 10/29/16 11:05 AM  Result Value Ref Range   Total Protein ELP 8.0 6.0 - 8.5 g/dL   Albumin ELP 3.2 2.9 - 4.4 g/dL   Alpha-1-Globulin 0.3 0.0 - 0.4 g/dL   Alpha-2-Globulin 0.9 0.4 - 1.0 g/dL   Beta Globulin 1.2 0.7 - 1.3 g/dL   Gamma Globulin 2.4 (H) 0.4 - 1.8 g/dL   M-Spike, % Not Observed Not Observed g/dL   SPE Interp. Comment     Comment: (NOTE) The SPE pattern reflects a polyclonal increase in gamma globulin due to numerous clones of plasma cells producing heterogeneous  antibody in response to some form of  antigenic stimulus.  Hypergammaglob- ulinemia is found in a wide variety of infectious, non-infectious, and autoimmune disease states. Evidence of monoclonal protein is not apparent. Performed At: [facility] [place], [place] [id number] Lindon Romp MD YD:[id number]    Comment Comment     Comment: (NOTE) Protein electrophoresis scan will follow via computer, mail, or courier delivery.    GLOBULIN, TOTAL 4.8 (H) 2.2 - 3.9 g/dL   A/G Ratio 0.7 0.7 - 1.7  Kappa/lambda light chains     Status: Abnormal   Collection Time: 10/29/16 11:05 AM  Result Value Ref Range   Kappa free light chain 47.8 (H) 3.3 - 19.4 mg/L   Lamda free light chains 41.6 (H) 5.7 - 26.3 mg/L   Kappa, lamda light chain ratio 1.15 0.26 - 1.65    Comment: (NOTE) Performed At: [facility] [place], [place] [id number] Lindon Romp MD ZP:[id number]   Lactate dehydrogenase     Status: None   Collection Time: 10/29/16 11:05 AM  Result Value Ref Range   LDH 120 98 - 192 U/L  Immunoglobulins, QN, A/E/G/M     Status: Abnormal   Collection Time: 10/29/16 11:05 AM  Result Value Ref Range   IgG (Immunoglobin G), Serum 2,058 (H) 700 - 1,600 mg/dL   IgA 468 (H) 87 - 352 mg/dL   IgM (Immunoglobulin M), Srm 55 26 - 217 mg/dL   IgE (Immunoglobulin E), Serum 24 0 - 100 IU/mL    Comment: (NOTE) Performed At: [facility] [address] [place], [place] [id number] Lindon Romp MD VO:[id number]      Assessment & Plan  1. Cough - benzonatate (TESSALON) 100 MG capsule; Take 1-2 capsules (100-200 mg total) by mouth 3 (three) times daily as needed for cough.  Dispense: 30 capsule; Refill: 0  2. Chronic obstructive pulmonary disease with acute exacerbation (HCC) - azithromycin (ZITHROMAX) 250 MG tablet; Take 2 tablets on day 1; Take 1 tablet daily for days 2-5  Dispense: 6 each; Refill: 0 - predniSONE (DELTASONE) 20 MG  tablet; Take 1 tablet (20 mg total) by mouth 2 (two) times daily with a meal for 5 days. 50m (2 tablets) twice daily (breakfast and lunch)  Dispense: 10 tablet; Refill: 0 - Continue Symbicort daily; Albuterol PRN  3. Seasonal allergic rhinitis, unspecified trigger - Continue Flonase  4. Borderline diabetes - Discussed healthy diet while taking prednisone; call clinic if polyuria/dipsia/phagia.  -Red flags and when to present for emergency care or RTC including fever >101.77F, chest pain, shortness of breath not relieved with inhaler, new/worsening/un-resolving symptoms, reviewed with patient at time of visit. Follow up and care instructions discussed and provided in AVS.

## 2016-12-22 NOTE — Patient Instructions (Addendum)

## 2016-12-22 NOTE — Telephone Encounter (Signed)
Copied from [place] [id number]. Topic: Quick Communication - See Telephone Encounter >> Dec 22, 2016  2:27 PM Burnis Medin, NT wrote: CRM for notification. See Telephone encounter for: Dominica Severin from the pharmacy is calling to clarify SIG. Would like a call back.  12/22/16.

## 2016-12-24 NOTE — Addendum Note (Signed)
Addended by: Raelyn Ensign E on: 12/24/2016 04:50 PM   Modules accepted: Level of Service

## 2016-12-24 NOTE — Telephone Encounter (Signed)
The last Rx that was sent to the pharmacy was sent from you. Im assuming the Pharmacist is referring to the RX that was sent on 12/22/2016.

## 2016-12-24 NOTE — Telephone Encounter (Signed)
I think we took care of this issue this morning - could you call pharmacy to verify and let me know if there's a question still. Thanks!

## 2017-01-21 ENCOUNTER — Ambulatory Visit: Payer: Medicare HMO | Admitting: Family Medicine

## 2017-01-21 ENCOUNTER — Encounter: Payer: Self-pay | Admitting: Family Medicine

## 2017-01-21 DIAGNOSIS — I10 Essential (primary) hypertension: Secondary | ICD-10-CM | POA: Diagnosis not present

## 2017-01-21 DIAGNOSIS — M1A0720 Idiopathic chronic gout, left ankle and foot, without tophus (tophi): Secondary | ICD-10-CM

## 2017-01-21 DIAGNOSIS — E7800 Pure hypercholesterolemia, unspecified: Secondary | ICD-10-CM

## 2017-01-21 DIAGNOSIS — M170 Bilateral primary osteoarthritis of knee: Secondary | ICD-10-CM | POA: Diagnosis not present

## 2017-01-21 LAB — COMPLETE METABOLIC PANEL WITH GFR
AG Ratio: 1.1 (calc) (ref 1.0–2.5)
ALBUMIN MSPROF: 3.6 g/dL (ref 3.6–5.1)
ALT: 7 U/L (ref 6–29)
AST: 16 U/L (ref 10–35)
Alkaline phosphatase (APISO): 102 U/L (ref 33–130)
BILIRUBIN TOTAL: 0.5 mg/dL (ref 0.2–1.2)
BUN / CREAT RATIO: 17 (calc) (ref 6–22)
BUN: 20 mg/dL (ref 7–25)
CALCIUM: 9.9 mg/dL (ref 8.6–10.4)
CO2: 29 mmol/L (ref 20–32)
CREATININE: 1.16 mg/dL — AB (ref 0.60–0.93)
Chloride: 101 mmol/L (ref 98–110)
GFR, EST AFRICAN AMERICAN: 55 mL/min/{1.73_m2} — AB (ref >=60)
GFR, EST NON AFRICAN AMERICAN: 48 mL/min/{1.73_m2} — AB (ref >=60)
GLUCOSE: 98 mg/dL (ref 65–99)
Globulin: 3.3 g/dL (calc) (ref 1.9–3.7)
Potassium: 4.3 mmol/L (ref 3.5–5.3)
Sodium: 137 mmol/L (ref 135–146)
TOTAL PROTEIN: 6.9 g/dL (ref 6.1–8.1)

## 2017-01-21 LAB — LIPID PANEL
CHOL/HDL RATIO: 2.1 (calc) (ref <5.0)
Cholesterol: 155 mg/dL (ref <200)
HDL: 73 mg/dL (ref >50)
LDL CHOLESTEROL (CALC): 65 mg/dL
NON-HDL CHOLESTEROL (CALC): 82 mg/dL (ref <130)
TRIGLYCERIDES: 91 mg/dL (ref <150)

## 2017-01-21 LAB — URIC ACID: URIC ACID, SERUM: 5 mg/dL (ref 2.5–7.0)

## 2017-01-21 MED ORDER — HYDROCHLOROTHIAZIDE 12.5 MG PO CAPS: 12.5000 mg | ORAL_CAPSULE | Freq: Every day | ORAL | 0 refills | Status: DC

## 2017-01-21 MED ORDER — NAPROXEN 500 MG PO TABS: 500.0000 mg | ORAL_TABLET | Freq: Every day | ORAL | 0 refills | Status: DC

## 2017-01-21 MED ORDER — LOVASTATIN 40 MG PO TABS: 40.0000 mg | ORAL_TABLET | Freq: Every day | ORAL | 0 refills | Status: DC

## 2017-01-21 MED ORDER — ALLOPURINOL 100 MG PO TABS: 100.0000 mg | ORAL_TABLET | Freq: Two times a day (BID) | ORAL | 0 refills | Status: DC

## 2017-01-21 MED ORDER — ENALAPRIL MALEATE 10 MG PO TABS: 10.0000 mg | ORAL_TABLET | Freq: Every day | ORAL | 0 refills | Status: DC

## 2017-01-21 NOTE — Progress Notes (Signed)
Name: Colleen [name]   MRN: [id number]    DOB: Aug 10, 1945   Date:01/21/2017       Progress Note  Subjective  Chief Complaint  Chief Complaint  Patient presents with  . Medication Refill  . Hypertension    Denies any symptoms  . Hyperlipidemia  . COPD    Was recently sick and COPD was flaired up    Hypertension  This is a chronic problem. The problem is unchanged (low blood pressure 118/58). Pertinent negatives include no blurred vision, chest pain, headaches, palpitations or shortness of breath. Past treatments include ACE inhibitors and diuretics. There is no history of kidney disease, CAD/MI or CVA.  Hyperlipidemia  This is a chronic problem. The problem is controlled. Recent lipid tests were reviewed and are normal. Pertinent negatives include no chest pain, leg pain, myalgias or shortness of breath. Current antihyperlipidemic treatment includes statins.  COPD  There is no chest tightness, cough, difficulty breathing or shortness of breath. This is a chronic problem. The problem has been unchanged. Pertinent negatives include no chest pain, headaches, myalgias or sore throat. Her symptoms are alleviated by beta-agonist and steroid inhaler. Her past medical history is significant for COPD.   Pt. has history of chronic gout, last gout attack was over 5 years ago, now taking Allopurinol 100 mg BID, last uric acid level was 7.1 mg/dL, reports working well. No side effects, normal GFR    Past Medical History:  Diagnosis Date  . Asthma   . Cataract   . COPD (chronic obstructive pulmonary disease) ([place])   . GERD (gastroesophageal reflux disease)   . Gout   . Hyperlipidemia   . Hypertension   . Osteoarthrosis     Past Surgical History:  Procedure Laterality Date  . BREAST EXCISIONAL BIOPSY Left 04/08/2014   Intraductal papilloma-5 mm  . LUMBAR LAMINECTOMY    . TONSILLECTOMY      Family History  Problem Relation Age of Onset  . Diabetes Mother   . Lung cancer Father   .  Diabetes Sister   . Congestive Heart Failure Sister   . Glaucoma Sister   . Breast cancer Neg Hx     Social History   Socioeconomic History  . Marital status: Married    Spouse name: Not on file  . Number of children: Not on file  . Years of education: Not on file  . Highest education level: Not on file  Social Needs  . Financial resource strain: Not on file  . Food insecurity - worry: Not on file  . Food insecurity - inability: Not on file  . Transportation needs - medical: Not on file  . Transportation needs - non-medical: Not on file  Occupational History  . Not on file  Tobacco Use  . Smoking status: Former Smoker    Last attempt to quit: 11/13/2008    Years since quitting: 8.1  . Smokeless tobacco: Never Used  Substance and Sexual Activity  . Alcohol use: Yes    Alcohol/week: 3.6 oz    Types: 6 Cans of beer per week    Comment: 6 beers a week sometimes  . Drug use: No  . Sexual activity: Not Currently  Other Topics Concern  . Not on file  Social History Narrative  . Not on file     Current Outpatient Medications:  .  albuterol (PROVENTIL HFA;VENTOLIN HFA) 108 (90 Base) MCG/ACT inhaler, Inhale 1 puff into the lungs every 6 (six) hours as needed for wheezing  or shortness of breath., Disp: 1 Inhaler, Rfl: 5 .  aspirin EC 81 MG tablet, Take 81 mg by mouth daily., Disp: , Rfl:  .  budesonide-formoterol (SYMBICORT) 160-4.5 MCG/ACT inhaler, Inhale 2 puffs into the lungs 2 (two) times daily. (Patient taking differently: Inhale 2 puffs into the lungs daily. ), Disp: 1 Inhaler, Rfl: 5 .  enalapril (VASOTEC) 20 MG tablet, Take 1 tablet (20 mg total) by mouth daily., Disp: 90 tablet, Rfl: 0 .  fluticasone (FLONASE) 50 MCG/ACT nasal spray, Place 2 sprays into both nostrils daily. (Patient taking differently: Place 2 sprays into both nostrils daily as needed. ), Disp: 16 g, Rfl: 2 .  hydrochlorothiazide (MICROZIDE) 12.5 MG capsule, Take 1 capsule (12.5 mg total) by mouth daily.,  Disp: 90 capsule, Rfl: 0 .  lovastatin (MEVACOR) 40 MG tablet, Take 1 tablet (40 mg total) by mouth at bedtime., Disp: 90 tablet, Rfl: 0 .  meclizine (ANTIVERT) 25 MG tablet, TAKE ONE TABLET BY MOUTH THREE TIMES DAILY AS NEEDED FOR DIZZINESS, Disp: 30 tablet, Rfl: 0 .  naproxen (NAPROSYN) 500 MG tablet, Take 1 tablet (500 mg total) by mouth daily., Disp: 90 tablet, Rfl: 0 .  simethicone (GAS-X) 80 MG chewable tablet, Chew 1 tablet (80 mg total) by mouth 4 (four) times daily as needed for flatulence., Disp: 100 tablet, Rfl: 2 .  triamcinolone cream (KENALOG) 0.1 %, APPLY CREAM EXTERNALLY TO AFFECTED AREA TWICE DAILY, Disp: 80 g, Rfl: 0 .  allopurinol (ZYLOPRIM) 100 MG tablet, Take 1 tablet (100 mg total) by mouth 2 (two) times daily., Disp: 180 tablet, Rfl: 0 .  benzonatate (TESSALON) 100 MG capsule, Take 1-2 capsules (100-200 mg total) by mouth 3 (three) times daily as needed for cough. (Patient not taking: Reported on 01/21/2017), Disp: 30 capsule, Rfl: 0  No Known Allergies   Review of Systems  HENT: Negative for sore throat.   Eyes: Negative for blurred vision.  Respiratory: Negative for cough and shortness of breath.   Cardiovascular: Negative for chest pain and palpitations.  Musculoskeletal: Negative for myalgias.  Neurological: Negative for headaches.      Objective  Vitals:   01/21/17 0830  BP: (!) 118/58  Pulse: (!) 113  Resp: 18  Temp: (!) 97.5 F (36.4 C)  TempSrc: Oral  SpO2: 98%  Weight: 290 lb 3.2 oz (131.6 kg)  Height: '5\' 3"'  (1.6 m)    Physical Exam  Constitutional: She is oriented to person, place, and time and well-developed, well-nourished, and in no distress.  HENT:  Head: Normocephalic and atraumatic.  Mouth/Throat: Oropharynx is clear and moist.  Cardiovascular: Normal rate, regular rhythm and normal heart sounds.  No murmur heard. Pulmonary/Chest: Effort normal and breath sounds normal. She has no wheezes.  Abdominal: Soft. Bowel sounds are normal.  There is no tenderness.  Musculoskeletal: She exhibits edema.       Right knee: She exhibits swelling. Tenderness found. Medial joint line tenderness noted.       Left knee: She exhibits swelling. Tenderness found. Medial joint line tenderness noted.       Right ankle: She exhibits swelling.       Left ankle: She exhibits swelling.  Trace pitting edema bilateral lower extremities.   Neurological: She is alert and oriented to person, place, and time.  Psychiatric: Mood, memory, affect and judgment normal.  Nursing note and vitals reviewed.   Recent Results (from the past 2160 hour(s))  CBC with Differential/Platelet     Status: None  Collection Time: 10/29/16 11:05 AM  Result Value Ref Range   WBC 7.3 3.6 - 11.0 K/uL   RBC 4.49 3.80 - 5.20 MIL/uL   Hemoglobin 13.5 12.0 - 16.0 g/dL   HCT 39.9 35.0 - 47.0 %   MCV 88.8 80.0 - 100.0 fL   MCH 30.0 26.0 - 34.0 pg   MCHC 33.8 32.0 - 36.0 g/dL   RDW 13.8 11.5 - 14.5 %   Platelets 332 150 - 440 K/uL   Neutrophils Relative % 48 %   Neutro Abs 3.6 1.4 - 6.5 K/uL   Lymphocytes Relative 41 %   Lymphs Abs 3.0 1.0 - 3.6 K/uL   Monocytes Relative 7 %   Monocytes Absolute 0.5 0.2 - 0.9 K/uL   Eosinophils Relative 3 %   Eosinophils Absolute 0.2 0 - 0.7 K/uL   Basophils Relative 1 %   Basophils Absolute 0.1 0 - 0.1 K/uL  Comprehensive metabolic panel     Status: Abnormal   Collection Time: 10/29/16 11:05 AM  Result Value Ref Range   Sodium 134 (L) 135 - 145 mmol/L   Potassium 4.5 3.5 - 5.1 mmol/L   Chloride 98 (L) 101 - 111 mmol/L   CO2 28 22 - 32 mmol/L   Glucose, Bld 110 (H) 65 - 99 mg/dL   BUN 25 (H) 6 - 20 mg/dL   Creatinine, Ser 1.09 (H) 0.44 - 1.00 mg/dL   Calcium 9.9 8.9 - 10.3 mg/dL   Total Protein 8.8 (H) 6.5 - 8.1 g/dL   Albumin 3.8 3.5 - 5.0 g/dL   AST 20 15 - 41 U/L   ALT 12 (L) 14 - 54 U/L   Alkaline Phosphatase 122 38 - 126 U/L   Total Bilirubin 0.5 0.3 - 1.2 mg/dL   GFR calc non Af Amer 50 (L) >60 mL/min   GFR calc Af  Amer 58 (L) >60 mL/min    Comment: (NOTE) The eGFR has been calculated using the CKD EPI equation. This calculation has not been validated in all clinical situations. eGFR's persistently <60 mL/min signify possible Chronic Kidney Disease.    Anion gap 8 5 - 15  Protein electrophoresis, serum     Status: Abnormal   Collection Time: 10/29/16 11:05 AM  Result Value Ref Range   Total Protein ELP 8.0 6.0 - 8.5 g/dL   Albumin ELP 3.2 2.9 - 4.4 g/dL   Alpha-1-Globulin 0.3 0.0 - 0.4 g/dL   Alpha-2-Globulin 0.9 0.4 - 1.0 g/dL   Beta Globulin 1.2 0.7 - 1.3 g/dL   Gamma Globulin 2.4 (H) 0.4 - 1.8 g/dL   M-Spike, % Not Observed Not Observed g/dL   SPE Interp. Comment     Comment: (NOTE) The SPE pattern reflects a polyclonal increase in gamma globulin due to numerous clones of plasma cells producing heterogeneous antibody in response to some form of antigenic stimulus.  Hypergammaglob- ulinemia is found in a wide variety of infectious, non-infectious, and autoimmune disease states. Evidence of monoclonal protein is not apparent. Performed At: [facility] [place], [place] [id number] Lindon Romp MD [id number]    Comment Comment     Comment: (NOTE) Protein electrophoresis scan will follow via computer, mail, or courier delivery.    GLOBULIN, TOTAL 4.8 (H) 2.2 - 3.9 g/dL   A/G Ratio 0.7 0.7 - 1.7  Kappa/lambda light chains     Status: Abnormal   Collection Time: 10/29/16 11:05 AM  Result Value Ref Range   Kappa free light  chain 47.8 (H) 3.3 - 19.4 mg/L   Lamda free light chains 41.6 (H) 5.7 - 26.3 mg/L   Kappa, lamda light chain ratio 1.15 0.26 - 1.65    Comment: (NOTE) Performed At: [facility] [place], [place] [id number] Lindon Romp MD [id number]   Lactate dehydrogenase     Status: None   Collection Time: 10/29/16 11:05 AM  Result Value Ref Range   LDH 120 98 - 192 U/L  Immunoglobulins, QN, A/E/G/M     Status:  Abnormal   Collection Time: 10/29/16 11:05 AM  Result Value Ref Range   IgG (Immunoglobin G), Serum 2,058 (H) 700 - 1,600 mg/dL   IgA 468 (H) 87 - 352 mg/dL   IgM (Immunoglobulin M), Srm 55 26 - 217 mg/dL   IgE (Immunoglobulin E), Serum 24 0 - 100 IU/mL    Comment: (NOTE) Performed At: [facility] [address] [place], [place] [id number] Lindon Romp MD [id number]      Assessment & Plan  1. Benign essential HTN Will decrease enalapril to 10 mg daily because of persistently below normal blood pressure, advised to check her pressures regularly - hydrochlorothiazide (MICROZIDE) 12.5 MG capsule; Take 1 capsule (12.5 mg total) by mouth daily.  Dispense: 90 capsule; Refill: 0 - enalapril (VASOTEC) 10 MG tablet; Take 1 tablet (10 mg total) by mouth daily.  Dispense: 90 tablet; Refill: 0  2. Pure hypercholesterolemia Obtain FLP, stable and continue on statin - lovastatin (MEVACOR) 40 MG tablet; Take 1 tablet (40 mg total) by mouth at bedtime.  Dispense: 90 tablet; Refill: 0 - COMPLETE METABOLIC PANEL WITH GFR - Lipid panel  3. Idiopathic chronic gout of left foot without tophus Obtain uric acid levels, continue on allopurinol twice a day - allopurinol (ZYLOPRIM) 100 MG tablet; Take 1 tablet (100 mg total) by mouth 2 (two) times daily.  Dispense: 180 tablet; Refill: 0 - Uric acid  4. Primary osteoarthritis of both knees  - naproxen (NAPROSYN) 500 MG tablet; Take 1 tablet (500 mg total) by mouth daily.  Dispense: 90 tablet; Refill: 0   Colleen [name] [facility] 01/21/2017 9:01 AM

## 2017-01-22 ENCOUNTER — Other Ambulatory Visit: Payer: Self-pay | Admitting: Family Medicine

## 2017-01-22 DIAGNOSIS — I10 Essential (primary) hypertension: Secondary | ICD-10-CM

## 2017-01-22 DIAGNOSIS — E7800 Pure hypercholesterolemia, unspecified: Secondary | ICD-10-CM

## 2017-02-28 DIAGNOSIS — M6530 Trigger finger, unspecified finger: Secondary | ICD-10-CM | POA: Insufficient documentation

## 2017-02-28 DIAGNOSIS — M170 Bilateral primary osteoarthritis of knee: Secondary | ICD-10-CM | POA: Diagnosis not present

## 2017-02-28 DIAGNOSIS — M65332 Trigger finger, left middle finger: Secondary | ICD-10-CM | POA: Insufficient documentation

## 2017-02-28 DIAGNOSIS — D892 Hypergammaglobulinemia, unspecified: Secondary | ICD-10-CM | POA: Diagnosis not present

## 2017-02-28 DIAGNOSIS — M1A00X0 Idiopathic chronic gout, unspecified site, without tophus (tophi): Secondary | ICD-10-CM | POA: Diagnosis not present

## 2017-02-28 DIAGNOSIS — M65342 Trigger finger, left ring finger: Secondary | ICD-10-CM | POA: Diagnosis not present

## 2017-04-21 ENCOUNTER — Other Ambulatory Visit: Payer: Self-pay

## 2017-04-21 ENCOUNTER — Ambulatory Visit (INDEPENDENT_AMBULATORY_CARE_PROVIDER_SITE_OTHER): Payer: PPO | Admitting: Family Medicine

## 2017-04-21 ENCOUNTER — Encounter: Payer: Self-pay | Admitting: Family Medicine

## 2017-04-21 VITALS — BP 120/68 | HR 92 | Temp 97.5°F | Resp 14 | Ht 65.0 in | Wt 292.8 lb

## 2017-04-21 DIAGNOSIS — M1A0720 Idiopathic chronic gout, left ankle and foot, without tophus (tophi): Secondary | ICD-10-CM

## 2017-04-21 DIAGNOSIS — I10 Essential (primary) hypertension: Secondary | ICD-10-CM | POA: Diagnosis not present

## 2017-04-21 DIAGNOSIS — Z5181 Encounter for therapeutic drug level monitoring: Secondary | ICD-10-CM

## 2017-04-21 DIAGNOSIS — R7303 Prediabetes: Secondary | ICD-10-CM | POA: Diagnosis not present

## 2017-04-21 DIAGNOSIS — J449 Chronic obstructive pulmonary disease, unspecified: Secondary | ICD-10-CM

## 2017-04-21 DIAGNOSIS — E7800 Pure hypercholesterolemia, unspecified: Secondary | ICD-10-CM | POA: Diagnosis not present

## 2017-04-21 DIAGNOSIS — N183 Chronic kidney disease, stage 3 unspecified: Secondary | ICD-10-CM | POA: Insufficient documentation

## 2017-04-21 DIAGNOSIS — L309 Dermatitis, unspecified: Secondary | ICD-10-CM

## 2017-04-21 DIAGNOSIS — M170 Bilateral primary osteoarthritis of knee: Secondary | ICD-10-CM | POA: Diagnosis not present

## 2017-04-21 MED ORDER — TRIAMCINOLONE ACETONIDE 0.1 % EX CREA: TOPICAL_CREAM | CUTANEOUS | 0 refills | Status: DC

## 2017-04-21 MED ORDER — NAPROXEN 375 MG PO TABS: 375.0000 mg | ORAL_TABLET | Freq: Every day | ORAL | 2 refills | Status: DC | PRN

## 2017-04-21 MED ORDER — LOVASTATIN 40 MG PO TABS: 40.0000 mg | ORAL_TABLET | Freq: Every day | ORAL | 1 refills | Status: DC

## 2017-04-21 MED ORDER — ALLOPURINOL 100 MG PO TABS: 200.0000 mg | ORAL_TABLET | Freq: Every day | ORAL | 1 refills | Status: DC

## 2017-04-21 MED ORDER — BUDESONIDE-FORMOTEROL FUMARATE 160-4.5 MCG/ACT IN AERO: 2.0000 | INHALATION_SPRAY | Freq: Two times a day (BID) | RESPIRATORY_TRACT | 1 refills | Status: DC

## 2017-04-21 MED ORDER — ENALAPRIL MALEATE 10 MG PO TABS: 10.0000 mg | ORAL_TABLET | Freq: Every day | ORAL | 1 refills | Status: DC

## 2017-04-21 MED ORDER — HYDROCHLOROTHIAZIDE 12.5 MG PO CAPS: 12.5000 mg | ORAL_CAPSULE | Freq: Every day | ORAL | 1 refills | Status: DC

## 2017-04-21 NOTE — Patient Instructions (Addendum)
Check out the information at familydoctor.org entitled "Nutrition for Weight Loss: What You Need to Know about Fad Diets" Try to lose between 1-2 pounds per week by taking in fewer calories and burning off more calories You can succeed by limiting portions, limiting foods dense in calories and fat, becoming more active, and drinking 8 glasses of water a day (64 ounces) Don't skip meals, especially breakfast, as skipping meals may alter your metabolism Do not use over-the-counter weight loss pills or gimmicks that claim rapid weight loss A healthy BMI (or body mass index) is between 18.5 and 24.9 You can calculate your ideal BMI at the [place] website ClubMonetize.fr Decrease your naproxen (naprosyn) to 375 mg daily Return in one month for your labs Try to follow the DASH guidelines (DASH stands for Dietary Approaches to Stop Hypertension). Try to limit the sodium in your diet to no more than 1,500mg  of sodium per day. Certainly try to not exceed 2,000 mg per day at the very most. Do not add salt when cooking or at the table.  Check the sodium amount on labels when shopping, and choose items lower in sodium when given a choice. Avoid or limit foods that already contain a lot of sodium. Eat a diet rich in fruits and vegetables and whole grains, and try to lose weight if overweight or obese  Obesity, Adult Obesity is the condition of having too much total body fat. Being overweight or obese means that your weight is greater than what is considered healthy for your body size. Obesity is determined by a measurement called BMI. BMI is an estimate of body fat and is calculated from height and weight. For adults, a BMI of 30 or higher is considered obese. Obesity can eventually lead to other health concerns and major illnesses, including:  Stroke.  Coronary artery disease (CAD).  Type 2 diabetes.  Some types of cancer, including cancers of the colon,  breast, uterus, and gallbladder.  Osteoarthritis.  High blood pressure (hypertension).  High cholesterol.  Sleep apnea.  Gallbladder stones.  Infertility problems.  What are the causes? The main cause of obesity is taking in (consuming) more calories than your body uses for energy. Other factors that contribute to this condition may include:  Being born with genes that make you more likely to become obese.  Having a medical condition that causes obesity. These conditions include: ? Hypothyroidism. ? Polycystic ovarian syndrome (PCOS). ? Binge-eating disorder. ? Cushing syndrome.  Taking certain medicines, such as steroids, antidepressants, and seizure medicines.  Not being physically active (sedentary lifestyle).  Living where there are limited places to exercise safely or buy healthy foods.  Not getting enough sleep.  What increases the risk? The following factors may increase your risk of this condition:  Having a family history of obesity.  Being a woman of African-American descent.  Being a man of Hispanic descent.  What are the signs or symptoms? Having excessive body fat is the main symptom of this condition. How is this diagnosed? This condition may be diagnosed based on:  Your symptoms.  Your medical history.  A physical exam. Your health care provider may measure: ? Your BMI. If you are an adult with a BMI between 25 and less than 30, you are considered overweight. If you are an adult with a BMI of 30 or higher, you are considered obese. ? The distances around your hips and your waist (circumferences). These may be compared to each other to help diagnose your condition. ?  Your skinfold thickness. Your health care provider may gently pinch a fold of your skin and measure it.  How is this treated? Treatment for this condition often includes changing your lifestyle. Treatment may include some or all of the following:  Dietary changes. Work with your  health care provider and a dietitian to set a weight-loss goal that is healthy and reasonable for you. Dietary changes may include eating: ? Smaller portions. A portion size is the amount of a particular food that is healthy for you to eat at one time. This varies from person to person. ? Low-calorie or low-fat options. ? More whole grains, fruits, and vegetables.  Regular physical activity. This may include aerobic activity (cardio) and strength training.  Medicine to help you lose weight. Your health care provider may prescribe medicine if you are unable to lose 1 pound a week after 6 weeks of eating more healthily and doing more physical activity.  Surgery. Surgical options may include gastric banding and gastric bypass. Surgery may be done if: ? Other treatments have not helped to improve your condition. ? You have a BMI of 40 or higher. ? You have life-threatening health problems related to obesity.  Follow these instructions at home:  Eating and drinking   Follow recommendations from your health care provider about what you eat and drink. Your health care provider may advise you to: ? Limit fast foods, sweets, and processed snack foods. ? Choose low-fat options, such as low-fat milk instead of whole milk. ? Eat 5 or more servings of fruits or vegetables every day. ? Eat at home more often. This gives you more control over what you eat. ? Choose healthy foods when you eat out. ? Learn what a healthy portion size is. ? Keep low-fat snacks on hand. ? Avoid sugary drinks, such as soda, fruit juice, iced tea sweetened with sugar, and flavored milk. ? Eat a healthy breakfast.  Drink enough water to keep your urine clear or pale yellow.  Do not go without eating for long periods of time (do not fast) or follow a fad diet. Fasting and fad diets can be unhealthy and even dangerous. Physical Activity  Exercise regularly, as told by your health care provider. Ask your health care  provider what types of exercise are safe for you and how often you should exercise.  Warm up and stretch before being active.  Cool down and stretch after being active.  Rest between periods of activity. Lifestyle  Limit the time that you spend in front of your TV, computer, or video game system.  Find ways to reward yourself that do not involve food.  Limit alcohol intake to no more than 1 drink a day for nonpregnant women and 2 drinks a day for men. One drink equals 12 oz of beer, 5 oz of wine, or 1 oz of hard liquor. General instructions  Keep a weight loss journal to keep track of the food you eat and how much you exercise you get.  Take over-the-counter and prescription medicines only as told by your health care provider.  Take vitamins and supplements only as told by your health care provider.  Consider joining a support group. Your health care provider may be able to recommend a support group.  Keep all follow-up visits as told by your health care provider. This is important. Contact a health care provider if:  You are unable to meet your weight loss goal after 6 weeks of dietary and lifestyle changes.  This information is not intended to replace advice given to you by your health care provider. Make sure you discuss any questions you have with your health care provider. Document Released: 02/19/2004 Document Revised: 06/16/2015 Document Reviewed: 10/30/2014 Elsevier Interactive Patient Education  2018 [place] Eating Plan DASH stands for "Dietary Approaches to Stop Hypertension." The DASH eating plan is a healthy eating plan that has been shown to reduce high blood pressure (hypertension). It may also reduce your risk for type 2 diabetes, heart disease, and stroke. The DASH eating plan may also help with weight loss. What are tips for following this plan? General guidelines  Avoid eating more than 2,300 mg (milligrams) of salt (sodium) a day. If you have  hypertension, you may need to reduce your sodium intake to 1,500 mg a day.  Limit alcohol intake to no more than 1 drink a day for nonpregnant women and 2 drinks a day for men. One drink equals 12 oz of beer, 5 oz of wine, or 1 oz of hard liquor.  Work with your health care provider to maintain a healthy body weight or to lose weight. Ask what an ideal weight is for you.  Get at least 30 minutes of exercise that causes your heart to beat faster (aerobic exercise) most days of the week. Activities may include walking, swimming, or biking.  Work with your health care provider or diet and nutrition specialist (dietitian) to adjust your eating plan to your individual calorie needs. Reading food labels  Check food labels for the amount of sodium per serving. Choose foods with less than 5 percent of the Daily Value of sodium. Generally, foods with less than 300 mg of sodium per serving fit into this eating plan.  To find whole grains, look for the word "whole" as the first word in the ingredient list. Shopping  Buy products labeled as "low-sodium" or "no salt added."  Buy fresh foods. Avoid canned foods and premade or frozen meals. Cooking  Avoid adding salt when cooking. Use salt-free seasonings or herbs instead of table salt or sea salt. Check with your health care provider or pharmacist before using salt substitutes.  Do not fry foods. Cook foods using healthy methods such as baking, boiling, grilling, and broiling instead.  Cook with heart-healthy oils, such as olive, canola, soybean, or sunflower oil. Meal planning   Eat a balanced diet that includes: ? 5 or more servings of fruits and vegetables each day. At each meal, try to fill half of your plate with fruits and vegetables. ? Up to 6-8 servings of whole grains each day. ? Less than 6 oz of lean meat, poultry, or fish each day. A 3-oz serving of meat is about the same size as a deck of cards. One egg equals 1 oz. ? 2 servings of  low-fat dairy each day. ? A serving of nuts, seeds, or beans 5 times each week. ? Heart-healthy fats. Healthy fats called Omega-3 fatty acids are found in foods such as flaxseeds and coldwater fish, like sardines, salmon, and mackerel.  Limit how much you eat of the following: ? Canned or prepackaged foods. ? Food that is high in trans fat, such as fried foods. ? Food that is high in saturated fat, such as fatty meat. ? Sweets, desserts, sugary drinks, and other foods with added sugar. ? Full-fat dairy products.  Do not salt foods before eating.  Try to eat at least 2 vegetarian meals each week.  Eat more  home-cooked food and less restaurant, buffet, and fast food.  When eating at a restaurant, ask that your food be prepared with less salt or no salt, if possible. What foods are recommended? The items listed may not be a complete list. Talk with your dietitian about what dietary choices are best for you. Grains Whole-grain or whole-wheat bread. Whole-grain or whole-wheat pasta. Brown rice. [place]. Bulgur. Whole-grain and low-sodium cereals. Pita bread. Low-fat, low-sodium crackers. Whole-wheat flour tortillas. Vegetables Fresh or frozen vegetables (raw, steamed, roasted, or grilled). Low-sodium or reduced-sodium tomato and vegetable juice. Low-sodium or reduced-sodium tomato sauce and tomato paste. Low-sodium or reduced-sodium canned vegetables. Fruits All fresh, dried, or frozen fruit. Canned fruit in natural juice (without added sugar). Meat and other protein foods Skinless chicken or [place]. Ground chicken or [place]. Pork with fat trimmed off. Fish and seafood. Egg whites. Dried beans, peas, or lentils. Unsalted nuts, nut butters, and seeds. Unsalted canned beans. Lean cuts of beef with fat trimmed off. Low-sodium, lean deli meat. Dairy Low-fat (1%) or fat-free (skim) milk. Fat-free, low-fat, or reduced-fat cheeses. Nonfat, low-sodium ricotta or cottage cheese. Low-fat or  nonfat yogurt. Low-fat, low-sodium cheese. Fats and oils Soft margarine without trans fats. Vegetable oil. Low-fat, reduced-fat, or light mayonnaise and salad dressings (reduced-sodium). Canola, safflower, olive, soybean, and sunflower oils. Avocado. Seasoning and other foods Herbs. Spices. Seasoning mixes without salt. Unsalted popcorn and pretzels. Fat-free sweets. What foods are not recommended? The items listed may not be a complete list. Talk with your dietitian about what dietary choices are best for you. Grains Baked goods made with fat, such as croissants, muffins, or some breads. Dry pasta or rice meal packs. Vegetables Creamed or fried vegetables. Vegetables in a cheese sauce. Regular canned vegetables (not low-sodium or reduced-sodium). Regular canned tomato sauce and paste (not low-sodium or reduced-sodium). Regular tomato and vegetable juice (not low-sodium or reduced-sodium). Angie Fava. Olives. Fruits Canned fruit in a light or heavy syrup. Fried fruit. Fruit in cream or butter sauce. Meat and other protein foods Fatty cuts of meat. Ribs. Fried meat. Berniece Salines. Sausage. Bologna and other processed lunch meats. Salami. Fatback. Hotdogs. Bratwurst. Salted nuts and seeds. Canned beans with added salt. Canned or smoked fish. Whole eggs or egg yolks. Chicken or [place] with skin. Dairy Whole or 2% milk, cream, and half-and-half. Whole or full-fat cream cheese. Whole-fat or sweetened yogurt. Full-fat cheese. Nondairy creamers. Whipped toppings. Processed cheese and cheese spreads. Fats and oils Butter. Stick margarine. Lard. Shortening. Ghee. Bacon fat. Tropical oils, such as coconut, palm kernel, or palm oil. Seasoning and other foods Salted popcorn and pretzels. Onion salt, garlic salt, seasoned salt, table salt, and sea salt. Worcestershire sauce. Tartar sauce. Barbecue sauce. Teriyaki sauce. Soy sauce, including reduced-sodium. Steak sauce. Canned and packaged gravies. Fish sauce. Oyster  sauce. Cocktail sauce. Horseradish that you find on the shelf. Ketchup. Mustard. Meat flavorings and tenderizers. Bouillon cubes. Hot sauce and Tabasco sauce. Premade or packaged marinades. Premade or packaged taco seasonings. Relishes. Regular salad dressings. Where to find more information:  National Heart, Lung, and [place]: https://wilson-eaton.com/  American Heart Association: www.heart.org Summary  The DASH eating plan is a healthy eating plan that has been shown to reduce high blood pressure (hypertension). It may also reduce your risk for type 2 diabetes, heart disease, and stroke.  With the DASH eating plan, you should limit salt (sodium) intake to 2,300 mg a day. If you have hypertension, you may need to reduce your sodium intake to 1,500  mg a day.  When on the DASH eating plan, aim to eat more fresh fruits and vegetables, whole grains, lean proteins, low-fat dairy, and heart-healthy fats.  Work with your health care provider or diet and nutrition specialist (dietitian) to adjust your eating plan to your individual calorie needs. This information is not intended to replace advice given to you by your health care provider. Make sure you discuss any questions you have with your health care provider. Document Released: 12/31/2010 Document Revised: 01/05/2016 Document Reviewed: 01/05/2016 Elsevier Interactive Patient Education  [employer].

## 2017-04-21 NOTE — Assessment & Plan Note (Signed)
Well-controlled, limit salt and follow DASH guidelines

## 2017-04-21 NOTE — Assessment & Plan Note (Addendum)
Encouraged her to limit snacks; stay hydrated; see AVS; try to lose five pounds in the next month; would consider Belviq or other medicine for her to help with weight loss so she can have her knee replacement (needs a BMI less than 40)

## 2017-04-21 NOTE — Assessment & Plan Note (Signed)
Continue allopurinol; recheck Cr in one month

## 2017-04-21 NOTE — Assessment & Plan Note (Signed)
Check glucose and A1c; weight loss is key 

## 2017-04-21 NOTE — Assessment & Plan Note (Signed)
Limit saturated fats and continue statin, refill sent

## 2017-04-21 NOTE — Assessment & Plan Note (Signed)
Try the symbicort BID; use the rescue inhaler just when needed

## 2017-04-21 NOTE — Assessment & Plan Note (Signed)
Tylenol is the safest thing, per package direction; limit naproxen, reduce the dose and check Cr in one month

## 2017-04-21 NOTE — Assessment & Plan Note (Signed)
Left knee; lower dose of NSAID, try tylenol and topical ice; using walker and cane

## 2017-04-21 NOTE — Progress Notes (Signed)
BP 120/68 (BP Location: Right Arm, Patient Position: Sitting, Cuff Size: Normal)   Pulse 92   Temp (!) 97.5 F (36.4 C) (Oral)   Resp 14   Ht 5\' 5"  (1.651 m)   Wt 292 lb 12.8 oz (132.8 kg)   SpO2 93%   BMI 48.72 kg/m    Subjective:    Patient ID: Colleen [name], female    DOB: 07-17-1945, 72 y.o.   MRN: [id number]  HPI: Colleen [name] is a 72 y.o. female  Chief Complaint  Patient presents with  . Follow-up    HPI Patient is new to me; previous provider left our practice Has bad arthritis in her left knee; [employer]   Obesity; morbid obesity; I asked what she thought would help; she walks with a walker; walks with cane and holds on to wall at home; can't get out and be active; does eat coffee and bagel for breakfast; snacks a lot; chips and crackers and peanut butter crackers; drinking plenty of water  No hx of thyroid trouble  Gout; last uric acid came down from 7.1 to 5; on medicine for that  High cholesterol; taking statin; last LDL 65  Glucose in December was 98; she does remember prediabetes diagnosis and she was told to avoid starches and sodas so she doesn't do much of that; limiting potatoes  CKD stage 3; hydrating; taking naproxen  COPD; breathing is doing well; taking inhaler; just doing that at night; using SABA just when rainy and cloudy, may go two or three or days, bad weather  Depression screen [facility] 2/9 04/21/2017 01/21/2017 10/18/2016 10/04/2016 08/16/2016  Decreased Interest 0 0 0 0 0  Down, Depressed, Hopeless 0 0 0 0 0  PHQ - 2 Score 0 0 0 0 0    Relevant past medical, surgical, family and social history reviewed Past Medical History:  Diagnosis Date  . Asthma   . Cataract   . COPD (chronic obstructive pulmonary disease) ([place])   . GERD (gastroesophageal reflux disease)   . Gout   . Hyperlipidemia   . Hypertension   . Osteoarthrosis    Past Surgical History:  Procedure Laterality Date  . BREAST EXCISIONAL BIOPSY Left 04/08/2014   Intraductal  papilloma-5 mm  . LUMBAR LAMINECTOMY    . TONSILLECTOMY     Family History  Problem Relation Age of Onset  . Diabetes Mother   . Lung cancer Father   . Diabetes Sister   . Congestive Heart Failure Sister   . Glaucoma Sister   . Breast cancer Neg Hx    Social History   Tobacco Use  . Smoking status: Former Smoker    Last attempt to quit: 11/13/2008    Years since quitting: 8.4  . Smokeless tobacco: Never Used  Substance Use Topics  . Alcohol use: Yes    Alcohol/week: 3.6 oz    Types: 6 Cans of beer per week    Comment: 6 beers a week sometimes  . Drug use: No    Interim medical history since last visit reviewed. Allergies and medications reviewed  Review of Systems  Gastrointestinal: Negative for abdominal pain and blood in stool.   Per HPI unless specifically indicated above     Objective:    BP 120/68 (BP Location: Right Arm, Patient Position: Sitting, Cuff Size: Normal)   Pulse 92   Temp (!) 97.5 F (36.4 C) (Oral)   Resp 14   Ht 5\' 5"  (1.651 m)   Wt 292  lb 12.8 oz (132.8 kg)   SpO2 93%   BMI 48.72 kg/m   Wt Readings from Last 3 Encounters:  04/21/17 292 lb 12.8 oz (132.8 kg)  01/21/17 290 lb 3.2 oz (131.6 kg)  12/22/16 287 lb (130.2 kg)    Physical Exam  Constitutional: She appears well-developed and well-nourished. No distress.  Morbidly obese  HENT:  Head: Normocephalic and atraumatic.  Eyes: EOM are normal. No scleral icterus.  Neck: No thyromegaly present.  Cardiovascular: Normal rate, regular rhythm and normal heart sounds.  No murmur heard. Pulmonary/Chest: Effort normal and breath sounds normal. No respiratory distress. She has no wheezes.  Abdominal: Soft. Bowel sounds are normal. She exhibits no distension.  Musculoskeletal: Normal range of motion. She exhibits no edema.  Neurological: She is alert. She displays no tremor. She exhibits normal muscle tone.  ambualtory with walker; gait typical for morbid obesity  Skin: Skin is warm and  dry. She is not diaphoretic. No pallor.  Psychiatric: She has a normal mood and affect. Her behavior is normal.    Results for orders placed or performed in visit on 01/21/17  COMPLETE METABOLIC PANEL WITH GFR  Result Value Ref Range   Glucose, Bld 98 65 - 99 mg/dL   BUN 20 7 - 25 mg/dL   Creat 1.16 (H) 0.60 - 0.93 mg/dL   GFR, Est Non African American 48 (L) > OR = 60 mL/min/1.64m2   GFR, Est African American 55 (L) > OR = 60 mL/min/1.64m2   BUN/Creatinine Ratio 17 6 - 22 (calc)   Sodium 137 135 - 146 mmol/L   Potassium 4.3 3.5 - 5.3 mmol/L   Chloride 101 98 - 110 mmol/L   CO2 29 20 - 32 mmol/L   Calcium 9.9 8.6 - 10.4 mg/dL   Total Protein 6.9 6.1 - 8.1 g/dL   Albumin 3.6 3.6 - 5.1 g/dL   Globulin 3.3 1.9 - 3.7 g/dL (calc)   AG Ratio 1.1 1.0 - 2.5 (calc)   Total Bilirubin 0.5 0.2 - 1.2 mg/dL   Alkaline phosphatase (APISO) 102 33 - 130 U/L   AST 16 10 - 35 U/L   ALT 7 6 - 29 U/L  Lipid panel  Result Value Ref Range   Cholesterol 155 <200 mg/dL   HDL 73 >50 mg/dL   Triglycerides 91 <150 mg/dL   LDL Cholesterol (Calc) 65 mg/dL (calc)   Total CHOL/HDL Ratio 2.1 <5.0 (calc)   Non-HDL Cholesterol (Calc) 82 <130 mg/dL (calc)  Uric acid  Result Value Ref Range   Uric Acid, Serum 5.0 2.5 - 7.0 mg/dL      Assessment & Plan:   Problem List Items Addressed This Visit      Cardiovascular and Mediastinum   Benign essential HTN    Well-controlled, limit salt and follow DASH guidelines      Relevant Medications   enalapril (VASOTEC) 10 MG tablet   hydrochlorothiazide (MICROZIDE) 12.5 MG capsule   lovastatin (MEVACOR) 40 MG tablet     Respiratory   COPD (chronic obstructive pulmonary disease) (HCC)    Try the symbicort BID; use the rescue inhaler just when needed      Relevant Medications   budesonide-formoterol (SYMBICORT) 160-4.5 MCG/ACT inhaler     Musculoskeletal and Integument   Arthritis, degenerative    Left knee; lower dose of NSAID, try tylenol and topical  ice; using walker and cane      Relevant Medications   naproxen (NAPROSYN) 375 MG tablet   allopurinol (  ZYLOPRIM) 100 MG tablet     Genitourinary   Chronic kidney disease, stage III (moderate) (HCC)    Tylenol is the safest thing, per package direction; limit naproxen, reduce the dose and check Cr in one month        Other   Morbid obesity (HCC)    Encouraged her to limit snacks; stay hydrated; see AVS      HLD (hyperlipidemia)    Limit saturated fats and continue statin, refill sent      Relevant Medications   enalapril (VASOTEC) 10 MG tablet   hydrochlorothiazide (MICROZIDE) 12.5 MG capsule   lovastatin (MEVACOR) 40 MG tablet   Gout    Continue allopurinol; recheck Cr in one month      Relevant Medications   allopurinol (ZYLOPRIM) 100 MG tablet       Follow up plan: Return in about 1 month (around 05/22/2017) for fasting labs only; see me 2-3 days later.  An after-visit summary was printed and given to the patient at [place].  Please see the patient instructions which may contain other information and recommendations beyond what is mentioned above in the assessment and plan.  Meds ordered this encounter  Medications  . naproxen (NAPROSYN) 375 MG tablet    Sig: Take 1 tablet (375 mg total) by mouth daily as needed. (lower dose)    Dispense:  30 tablet    Refill:  2    Cancel the 500 mg strength  . allopurinol (ZYLOPRIM) 100 MG tablet    Sig: Take 2 tablets (200 mg total) by mouth daily.    Dispense:  180 tablet    Refill:  1  . budesonide-formoterol (SYMBICORT) 160-4.5 MCG/ACT inhaler    Sig: Inhale 2 puffs into the lungs 2 (two) times daily.    Dispense:  3 Inhaler    Refill:  1  . enalapril (VASOTEC) 10 MG tablet    Sig: Take 1 tablet (10 mg total) by mouth daily.    Dispense:  90 tablet    Refill:  1  . hydrochlorothiazide (MICROZIDE) 12.5 MG capsule    Sig: Take 1 capsule (12.5 mg total) by mouth daily.    Dispense:  90 capsule    Refill:  1  .  lovastatin (MEVACOR) 40 MG tablet    Sig: Take 1 tablet (40 mg total) by mouth at bedtime.    Dispense:  90 tablet    Refill:  1    No orders of the defined types were placed in this encounter.

## 2017-05-03 ENCOUNTER — Other Ambulatory Visit: Payer: Self-pay

## 2017-05-03 DIAGNOSIS — L309 Dermatitis, unspecified: Secondary | ICD-10-CM

## 2017-05-03 NOTE — Telephone Encounter (Signed)
Please update primary provider

## 2017-05-05 ENCOUNTER — Other Ambulatory Visit: Payer: Self-pay

## 2017-05-05 DIAGNOSIS — L309 Dermatitis, unspecified: Secondary | ICD-10-CM

## 2017-05-18 ENCOUNTER — Encounter: Payer: Self-pay | Admitting: Family Medicine

## 2017-05-18 ENCOUNTER — Ambulatory Visit
Admission: RE | Admit: 2017-05-18 | Discharge: 2017-05-18 | Disposition: A | Discharge status: Home / Self Care | Payer: PPO | Source: Ambulatory Visit | Attending: Family Medicine | Admitting: Family Medicine

## 2017-05-18 DIAGNOSIS — M85851 Other specified disorders of bone density and structure, right thigh: Secondary | ICD-10-CM | POA: Diagnosis not present

## 2017-05-18 DIAGNOSIS — Z13820 Encounter for screening for osteoporosis: Secondary | ICD-10-CM | POA: Insufficient documentation

## 2017-05-18 DIAGNOSIS — M8580 Other specified disorders of bone density and structure, unspecified site: Secondary | ICD-10-CM | POA: Insufficient documentation

## 2017-05-18 DIAGNOSIS — M8588 Other specified disorders of bone density and structure, other site: Secondary | ICD-10-CM | POA: Insufficient documentation

## 2017-05-18 DIAGNOSIS — Z780 Asymptomatic menopausal state: Secondary | ICD-10-CM | POA: Diagnosis not present

## 2017-05-23 ENCOUNTER — Other Ambulatory Visit: Payer: Self-pay

## 2017-05-23 DIAGNOSIS — Z5181 Encounter for therapeutic drug level monitoring: Secondary | ICD-10-CM

## 2017-05-23 DIAGNOSIS — E7800 Pure hypercholesterolemia, unspecified: Secondary | ICD-10-CM | POA: Diagnosis not present

## 2017-05-23 DIAGNOSIS — R7303 Prediabetes: Secondary | ICD-10-CM | POA: Diagnosis not present

## 2017-05-23 LAB — COMPLETE METABOLIC PANEL WITH GFR
AG Ratio: 0.9 (calc) — ABNORMAL LOW (ref 1.0–2.5)
ALT: 6 U/L (ref 6–29)
AST: 16 U/L (ref 10–35)
Albumin: 3.7 g/dL (ref 3.6–5.1)
Alkaline phosphatase (APISO): 107 U/L (ref 33–130)
BILIRUBIN TOTAL: 0.6 mg/dL (ref 0.2–1.2)
BUN/Creatinine Ratio: 19 (calc) (ref 6–22)
BUN: 22 mg/dL (ref 7–25)
CHLORIDE: 100 mmol/L (ref 98–110)
CO2: 32 mmol/L (ref 20–32)
Calcium: 9.6 mg/dL (ref 8.6–10.4)
Creat: 1.18 mg/dL — ABNORMAL HIGH (ref 0.60–0.93)
GFR, EST AFRICAN AMERICAN: 54 mL/min/{1.73_m2} — AB (ref >=60)
GFR, Est Non African American: 46 mL/min/{1.73_m2} — ABNORMAL LOW (ref >=60)
GLUCOSE: 99 mg/dL (ref 65–99)
Globulin: 3.9 g/dL (calc) — ABNORMAL HIGH (ref 1.9–3.7)
Potassium: 4.4 mmol/L (ref 3.5–5.3)
Sodium: 137 mmol/L (ref 135–146)
Total Protein: 7.6 g/dL (ref 6.1–8.1)

## 2017-05-23 LAB — CBC WITH DIFFERENTIAL/PLATELET
Basophils Absolute: 91 {cells}/uL (ref 0–200)
Basophils Relative: 1.4 %
Eosinophils Absolute: 137 {cells}/uL (ref 15–500)
Eosinophils Relative: 2.1 %
HCT: 38.7 % (ref 35.0–45.0)
Hemoglobin: 13.1 g/dL (ref 11.7–15.5)
Lymphs Abs: 2880 {cells}/uL (ref 850–3900)
MCH: 29.4 pg (ref 27.0–33.0)
MCHC: 33.9 g/dL (ref 32.0–36.0)
MCV: 86.8 fL (ref 80.0–100.0)
MPV: 10.6 fL (ref 7.5–12.5)
Monocytes Relative: 7.7 %
Neutro Abs: 2893 {cells}/uL (ref 1500–7800)
Neutrophils Relative %: 44.5 %
Platelets: 336 10*3/uL (ref 140–400)
RBC: 4.46 Million/uL (ref 3.80–5.10)
RDW: 12.9 % (ref 11.0–15.0)
Total Lymphocyte: 44.3 %
WBC mixed population: 501 {cells}/uL (ref 200–950)
WBC: 6.5 10*3/uL (ref 3.8–10.8)

## 2017-05-23 LAB — LIPID PANEL
Cholesterol: 158 mg/dL
HDL: 67 mg/dL
LDL Cholesterol (Calc): 76 mg/dL
Non-HDL Cholesterol (Calc): 91 mg/dL
Total CHOL/HDL Ratio: 2.4 (calc)
Triglycerides: 73 mg/dL

## 2017-05-23 LAB — TSH: TSH: 3.86 m[IU]/L (ref 0.40–4.50)

## 2017-05-24 LAB — HEMOGLOBIN A1C
EAG (MMOL/L): 7 (calc)
Hgb A1c MFr Bld: 6 % of total Hgb — ABNORMAL HIGH (ref <5.7)
Mean Plasma Glucose: 126 (calc)

## 2017-05-26 ENCOUNTER — Telehealth: Payer: Self-pay | Admitting: Family Medicine

## 2017-05-26 NOTE — Telephone Encounter (Signed)
Patient is calling to get results she has somewhere to go at 12:30pm.   [phone]

## 2017-05-26 NOTE — Telephone Encounter (Signed)
Copied from [place] [id number]. Topic: Quick Communication - Lab Results >> May 26, 2017  9:37 AM Chilton Greathouse, CMA wrote: Patient called.  Unable to reach patient. Unable to leave vm. If patient calls back please inform her of her most recent lab results.   >> May 26, 2017  9:48 AM Cleaster Corin, NT wrote: Pt. Calling back for lab results

## 2017-05-26 NOTE — Telephone Encounter (Signed)
Pt states she has already received her results and is waiting on a return call regarding keeping an appt.

## 2017-05-31 ENCOUNTER — Ambulatory Visit (INDEPENDENT_AMBULATORY_CARE_PROVIDER_SITE_OTHER): Payer: PPO | Admitting: Family Medicine

## 2017-05-31 ENCOUNTER — Encounter: Payer: Self-pay | Admitting: Family Medicine

## 2017-05-31 DIAGNOSIS — M8580 Other specified disorders of bone density and structure, unspecified site: Secondary | ICD-10-CM

## 2017-05-31 DIAGNOSIS — I10 Essential (primary) hypertension: Secondary | ICD-10-CM

## 2017-05-31 DIAGNOSIS — M170 Bilateral primary osteoarthritis of knee: Secondary | ICD-10-CM

## 2017-05-31 DIAGNOSIS — N183 Chronic kidney disease, stage 3 unspecified: Secondary | ICD-10-CM

## 2017-05-31 DIAGNOSIS — M1A0720 Idiopathic chronic gout, left ankle and foot, without tophus (tophi): Secondary | ICD-10-CM | POA: Diagnosis not present

## 2017-05-31 DIAGNOSIS — R7303 Prediabetes: Secondary | ICD-10-CM | POA: Diagnosis not present

## 2017-05-31 NOTE — Assessment & Plan Note (Signed)
She is working on weight loss

## 2017-05-31 NOTE — Progress Notes (Signed)
BP 136/80   Pulse 72   Temp 97.7 F (36.5 C) (Oral)   Resp 16   Ht 5\' 5"  (1.651 m)   Wt 288 lb 3.2 oz (130.7 kg)   SpO2 94%   BMI 47.96 kg/m    Subjective:    Patient ID: Colleen [name], female    DOB: [birth year], 72 y.o.   MRN: [id number]  HPI: Colleen [name] is a 72 y.o. female  Chief Complaint  Patient presents with  . Follow-up    HPI Patient is here for f/u  Morbid obesity; she is trying to work on her diet; cut down on breads and saturated fats  High cholesterol; just had labs done recently Lab Results  Component Value Date   CHOL 158 05/23/2017   HDL 67 05/23/2017   LDLCALC 76 05/23/2017   TRIG 73 05/23/2017   CHOLHDL 2.4 05/23/2017   Normal thryoid, not anemic  CKD stage 3; last GFR 54; had been taking NSAIDs for 20 years; had arthritis, back surgery  HTN; controlled with ACE-I and thiazide; she does not want to stop the thiazide  Depression screen [facility] 2/9 05/31/2017 04/21/2017 01/21/2017 10/18/2016 10/04/2016  Decreased Interest 0 0 0 0 0  Down, Depressed, Hopeless 0 0 0 0 0  PHQ - 2 Score 0 0 0 0 0    Relevant past medical, surgical, family and social history reviewed Past Medical History:  Diagnosis Date  . Asthma   . Cataract   . COPD (chronic obstructive pulmonary disease) ([place])   . GERD (gastroesophageal reflux disease)   . Gout   . Hyperlipidemia   . Hypertension   . Osteoarthrosis    Past Surgical History:  Procedure Laterality Date  . BREAST EXCISIONAL BIOPSY Left 04/08/2014   Intraductal papilloma-5 mm  . LUMBAR LAMINECTOMY    . TONSILLECTOMY     Family History  Problem Relation Age of Onset  . Diabetes Mother   . Lung cancer Father   . Diabetes Sister   . Congestive Heart Failure Sister   . Glaucoma Sister   . Breast cancer Neg Hx    Social History   Tobacco Use  . Smoking status: Former Smoker    Last attempt to quit: 11/13/2008    Years since quitting: 8.5  . Smokeless tobacco: Never Used  Substance Use Topics  .  Alcohol use: Yes    Alcohol/week: 3.6 oz    Types: 6 Cans of beer per week    Comment: 6 beers a week sometimes  . Drug use: No    Interim medical history since last visit reviewed. Allergies and medications reviewed  Review of Systems Per HPI unless specifically indicated above     Objective:    BP 136/80   Pulse 72   Temp 97.7 F (36.5 C) (Oral)   Resp 16   Ht 5\' 5"  (1.651 m)   Wt 288 lb 3.2 oz (130.7 kg)   SpO2 94%   BMI 47.96 kg/m   Wt Readings from Last 3 Encounters:  05/31/17 288 lb 3.2 oz (130.7 kg)  04/21/17 292 lb 12.8 oz (132.8 kg)  01/21/17 290 lb 3.2 oz (131.6 kg)    Physical Exam  Constitutional: She appears well-developed and well-nourished. No distress.  HENT:  Head: Normocephalic and atraumatic.  Eyes: EOM are normal. No scleral icterus.  Neck: No thyromegaly present.  Cardiovascular: Normal rate, regular rhythm and normal heart sounds.  No murmur heard. Pulmonary/Chest: Effort normal and breath  sounds normal. No respiratory distress. She has no wheezes.  Abdominal: Soft. Bowel sounds are normal. She exhibits no distension.  Musculoskeletal: She exhibits no edema.  Neurological: She is alert. She exhibits normal muscle tone.  Skin: Skin is warm and dry. She is not diaphoretic. No pallor.  Psychiatric: She has a normal mood and affect. Her behavior is normal. Judgment and thought content normal.    Results for orders placed or performed in visit on 05/23/17  Lipid panel  Result Value Ref Range   Cholesterol 158 <200 mg/dL   HDL 67 >50 mg/dL   Triglycerides 73 <150 mg/dL   LDL Cholesterol (Calc) 76 mg/dL (calc)   Total CHOL/HDL Ratio 2.4 <5.0 (calc)   Non-HDL Cholesterol (Calc) 91 <130 mg/dL (calc)  Hemoglobin A1c  Result Value Ref Range   Hgb A1c MFr Bld 6.0 (H) <5.7 % of total Hgb   Mean Plasma Glucose 126 (calc)   eAG (mmol/L) 7.0 (calc)  COMPLETE METABOLIC PANEL WITH GFR  Result Value Ref Range   Glucose, Bld 99 65 - 99 mg/dL   BUN 22  7 - 25 mg/dL   Creat 1.18 (H) 0.60 - 0.93 mg/dL   GFR, Est Non African American 46 (L) > OR = 60 mL/min/1.37m2   GFR, Est African American 54 (L) > OR = 60 mL/min/1.32m2   BUN/Creatinine Ratio 19 6 - 22 (calc)   Sodium 137 135 - 146 mmol/L   Potassium 4.4 3.5 - 5.3 mmol/L   Chloride 100 98 - 110 mmol/L   CO2 32 20 - 32 mmol/L   Calcium 9.6 8.6 - 10.4 mg/dL   Total Protein 7.6 6.1 - 8.1 g/dL   Albumin 3.7 3.6 - 5.1 g/dL   Globulin 3.9 (H) 1.9 - 3.7 g/dL (calc)   AG Ratio 0.9 (L) 1.0 - 2.5 (calc)   Total Bilirubin 0.6 0.2 - 1.2 mg/dL   Alkaline phosphatase (APISO) 107 33 - 130 U/L   AST 16 10 - 35 U/L   ALT 6 6 - 29 U/L  CBC with Differential/Platelet  Result Value Ref Range   WBC 6.5 3.8 - 10.8 Thousand/uL   RBC 4.46 3.80 - 5.10 Million/uL   Hemoglobin 13.1 11.7 - 15.5 g/dL   HCT 38.7 35.0 - 45.0 %   MCV 86.8 80.0 - 100.0 fL   MCH 29.4 27.0 - 33.0 pg   MCHC 33.9 32.0 - 36.0 g/dL   RDW 12.9 11.0 - 15.0 %   Platelets 336 140 - 400 Thousand/uL   MPV 10.6 7.5 - 12.5 fL   Neutro Abs 2,893 1,500 - 7,800 cells/uL   Lymphs Abs 2,880 850 - 3,900 cells/uL   WBC mixed population 501 200 - 950 cells/uL   Eosinophils Absolute 137 15 - 500 cells/uL   Basophils Absolute 91 0 - 200 cells/uL   Neutrophils Relative % 44.5 %   Total Lymphocyte 44.3 %   Monocytes Relative 7.7 %   Eosinophils Relative 2.1 %   Basophils Relative 1.4 %  TSH  Result Value Ref Range   TSH 3.86 0.40 - 4.50 mIU/L      Assessment & Plan:   Problem List Items Addressed This Visit      Cardiovascular and Mediastinum   Benign essential HTN    Try to avoid sodium in foods; continue ACE-I; she does not want to stop her fluid pill even after we discussed it        Musculoskeletal and Integument   Osteopenia  Discussed DEXA results; encouraged adequate calcium intake; fall precautions; vit D 1000 iu daily      Arthritis, degenerative    Recommend OTC tylenol, max of 3,000 mg per day         Genitourinary   Chronic kidney disease, stage III (moderate) (HCC)    Stop naproxen, start just tylenol for pain        Other   Prediabetes    She is working on weight loss      Morbid obesity ([place])    She is working on weight loss; she'll try to lose five more pounds before next visit      Gout    Patient does not want to stop the thiazide, even though it might make her gout flare          Follow up plan: Return in about 3 months (around 08/31/2017) for follow-up visit with Dr. Sanda Klein.  An after-visit summary was printed and given to the patient at [place].  Please see the patient instructions which may contain other information and recommendations beyond what is mentioned above in the assessment and plan.  No orders of the defined types were placed in this encounter.   No orders of the defined types were placed in this encounter.

## 2017-05-31 NOTE — Assessment & Plan Note (Signed)
Discussed DEXA results; encouraged adequate calcium intake; fall precautions; vit D 1000 iu daily

## 2017-05-31 NOTE — Assessment & Plan Note (Signed)
Recommend OTC tylenol, max of 3,000 mg per day

## 2017-05-31 NOTE — Assessment & Plan Note (Signed)
Try to avoid sodium in foods; continue ACE-I; she does not want to stop her fluid pill even after we discussed it

## 2017-05-31 NOTE — Patient Instructions (Addendum)
If you need something for aches or pains, try to use Tylenol (acetaminophen) instead of non-steroidals (which include Aleve, ibuprofen, Advil, Motrin, and naproxen); non-steroidals can cause long-term kidney damage Do not exceed 3,000 mg of tylenol in one day  Take 1000 iu of vitamin D3 once a day Try to get 3 servings of calcium a day  Try to follow the DASH guidelines (DASH stands for Dietary Approaches to Stop Hypertension). Try to limit the sodium in your diet to no more than 1,500mg  of sodium per day. Certainly try to not exceed 2,000 mg per day at the very most. Do not add salt when cooking or at the table.  Check the sodium amount on labels when shopping, and choose items lower in sodium when given a choice. Avoid or limit foods that already contain a lot of sodium. Eat a diet rich in fruits and vegetables and whole grains, and try to lose weight if overweight or obese  Check out the information at familydoctor.org entitled "Nutrition for Weight Loss: What You Need to Know about Fad Diets" Try to lose between 1-2 pounds per week by taking in fewer calories and burning off more calories You can succeed by limiting portions, limiting foods dense in calories and fat, becoming more active, and drinking 8 glasses of water a day (64 ounces) Don't skip meals, especially breakfast, as skipping meals may alter your metabolism Do not use over-the-counter weight loss pills or gimmicks that claim rapid weight loss A healthy BMI (or body mass index) is between 18.5 and 24.9 You can calculate your ideal BMI at the [place] website ClubMonetize.fr  DASH Eating Plan DASH stands for "Dietary Approaches to Stop Hypertension." The DASH eating plan is a healthy eating plan that has been shown to reduce high blood pressure (hypertension). It may also reduce your risk for type 2 diabetes, heart disease, and stroke. The DASH eating plan may also help with weight  loss. What are tips for following this plan? General guidelines  Avoid eating more than 2,300 mg (milligrams) of salt (sodium) a day. If you have hypertension, you may need to reduce your sodium intake to 1,500 mg a day.  Limit alcohol intake to no more than 1 drink a day for nonpregnant women and 2 drinks a day for men. One drink equals 12 oz of beer, 5 oz of wine, or 1 oz of hard liquor.  Work with your health care provider to maintain a healthy body weight or to lose weight. Ask what an ideal weight is for you.  Get at least 30 minutes of exercise that causes your heart to beat faster (aerobic exercise) most days of the week. Activities may include walking, swimming, or biking.  Work with your health care provider or diet and nutrition specialist (dietitian) to adjust your eating plan to your individual calorie needs. Reading food labels  Check food labels for the amount of sodium per serving. Choose foods with less than 5 percent of the Daily Value of sodium. Generally, foods with less than 300 mg of sodium per serving fit into this eating plan.  To find whole grains, look for the word "whole" as the first word in the ingredient list. Shopping  Buy products labeled as "low-sodium" or "no salt added."  Buy fresh foods. Avoid canned foods and premade or frozen meals. Cooking  Avoid adding salt when cooking. Use salt-free seasonings or herbs instead of table salt or sea salt. Check with your health care provider or pharmacist before  using salt substitutes.  Do not fry foods. Cook foods using healthy methods such as baking, boiling, grilling, and broiling instead.  Cook with heart-healthy oils, such as olive, canola, soybean, or sunflower oil. Meal planning   Eat a balanced diet that includes: ? 5 or more servings of fruits and vegetables each day. At each meal, try to fill half of your plate with fruits and vegetables. ? Up to 6-8 servings of whole grains each day. ? Less than 6  oz of lean meat, poultry, or fish each day. A 3-oz serving of meat is about the same size as a deck of cards. One egg equals 1 oz. ? 2 servings of low-fat dairy each day. ? A serving of nuts, seeds, or beans 5 times each week. ? Heart-healthy fats. Healthy fats called Omega-3 fatty acids are found in foods such as flaxseeds and coldwater fish, like sardines, salmon, and mackerel.  Limit how much you eat of the following: ? Canned or prepackaged foods. ? Food that is high in trans fat, such as fried foods. ? Food that is high in saturated fat, such as fatty meat. ? Sweets, desserts, sugary drinks, and other foods with added sugar. ? Full-fat dairy products.  Do not salt foods before eating.  Try to eat at least 2 vegetarian meals each week.  Eat more home-cooked food and less restaurant, buffet, and fast food.  When eating at a restaurant, ask that your food be prepared with less salt or no salt, if possible. What foods are recommended? The items listed may not be a complete list. Talk with your dietitian about what dietary choices are best for you. Grains Whole-grain or whole-wheat bread. Whole-grain or whole-wheat pasta. Brown rice. [place]. Bulgur. Whole-grain and low-sodium cereals. Pita bread. Low-fat, low-sodium crackers. Whole-wheat flour tortillas. Vegetables Fresh or frozen vegetables (raw, steamed, roasted, or grilled). Low-sodium or reduced-sodium tomato and vegetable juice. Low-sodium or reduced-sodium tomato sauce and tomato paste. Low-sodium or reduced-sodium canned vegetables. Fruits All fresh, dried, or frozen fruit. Canned fruit in natural juice (without added sugar). Meat and other protein foods Skinless chicken or [place]. Ground chicken or [place]. Pork with fat trimmed off. Fish and seafood. Egg whites. Dried beans, peas, or lentils. Unsalted nuts, nut butters, and seeds. Unsalted canned beans. Lean cuts of beef with fat trimmed off. Low-sodium, lean deli  meat. Dairy Low-fat (1%) or fat-free (skim) milk. Fat-free, low-fat, or reduced-fat cheeses. Nonfat, low-sodium ricotta or cottage cheese. Low-fat or nonfat yogurt. Low-fat, low-sodium cheese. Fats and oils Soft margarine without trans fats. Vegetable oil. Low-fat, reduced-fat, or light mayonnaise and salad dressings (reduced-sodium). Canola, safflower, olive, soybean, and sunflower oils. Avocado. Seasoning and other foods Herbs. Spices. Seasoning mixes without salt. Unsalted popcorn and pretzels. Fat-free sweets. What foods are not recommended? The items listed may not be a complete list. Talk with your dietitian about what dietary choices are best for you. Grains Baked goods made with fat, such as croissants, muffins, or some breads. Dry pasta or rice meal packs. Vegetables Creamed or fried vegetables. Vegetables in a cheese sauce. Regular canned vegetables (not low-sodium or reduced-sodium). Regular canned tomato sauce and paste (not low-sodium or reduced-sodium). Regular tomato and vegetable juice (not low-sodium or reduced-sodium). Angie Fava. Olives. Fruits Canned fruit in a light or heavy syrup. Fried fruit. Fruit in cream or butter sauce. Meat and other protein foods Fatty cuts of meat. Ribs. Fried meat. Berniece Salines. Sausage. Bologna and other processed lunch meats. Salami. Fatback. Hotdogs. Bratwurst. Salted nuts  and seeds. Canned beans with added salt. Canned or smoked fish. Whole eggs or egg yolks. Chicken or [place] with skin. Dairy Whole or 2% milk, cream, and half-and-half. Whole or full-fat cream cheese. Whole-fat or sweetened yogurt. Full-fat cheese. Nondairy creamers. Whipped toppings. Processed cheese and cheese spreads. Fats and oils Butter. Stick margarine. Lard. Shortening. Ghee. Bacon fat. Tropical oils, such as coconut, palm kernel, or palm oil. Seasoning and other foods Salted popcorn and pretzels. Onion salt, garlic salt, seasoned salt, table salt, and sea salt. Worcestershire  sauce. Tartar sauce. Barbecue sauce. Teriyaki sauce. Soy sauce, including reduced-sodium. Steak sauce. Canned and packaged gravies. Fish sauce. Oyster sauce. Cocktail sauce. Horseradish that you find on the shelf. Ketchup. Mustard. Meat flavorings and tenderizers. Bouillon cubes. Hot sauce and Tabasco sauce. Premade or packaged marinades. Premade or packaged taco seasonings. Relishes. Regular salad dressings. Where to find more information:  National Heart, Lung, and [place]: https://wilson-eaton.com/  American Heart Association: www.heart.org Summary  The DASH eating plan is a healthy eating plan that has been shown to reduce high blood pressure (hypertension). It may also reduce your risk for type 2 diabetes, heart disease, and stroke.  With the DASH eating plan, you should limit salt (sodium) intake to 2,300 mg a day. If you have hypertension, you may need to reduce your sodium intake to 1,500 mg a day.  When on the DASH eating plan, aim to eat more fresh fruits and vegetables, whole grains, lean proteins, low-fat dairy, and heart-healthy fats.  Work with your health care provider or diet and nutrition specialist (dietitian) to adjust your eating plan to your individual calorie needs. This information is not intended to replace advice given to you by your health care provider. Make sure you discuss any questions you have with your health care provider. Document Released: 12/31/2010 Document Revised: 01/05/2016 Document Reviewed: 01/05/2016 Elsevier Interactive Patient Education  [employer].

## 2017-05-31 NOTE — Assessment & Plan Note (Signed)
Patient does not want to stop the thiazide, even though it might make her gout flare

## 2017-05-31 NOTE — Assessment & Plan Note (Signed)
She is working on weight loss; she'll try to lose five more pounds before next visit

## 2017-05-31 NOTE — Assessment & Plan Note (Signed)
Stop naproxen, start just tylenol for pain

## 2017-08-29 DIAGNOSIS — M1A00X0 Idiopathic chronic gout, unspecified site, without tophus (tophi): Secondary | ICD-10-CM | POA: Diagnosis not present

## 2017-08-29 DIAGNOSIS — Z6841 Body Mass Index (BMI) 40.0 and over, adult: Secondary | ICD-10-CM | POA: Diagnosis not present

## 2017-08-29 DIAGNOSIS — M170 Bilateral primary osteoarthritis of knee: Secondary | ICD-10-CM | POA: Diagnosis not present

## 2017-09-01 ENCOUNTER — Ambulatory Visit (INDEPENDENT_AMBULATORY_CARE_PROVIDER_SITE_OTHER): Payer: PPO

## 2017-09-01 ENCOUNTER — Encounter: Payer: Self-pay | Admitting: Family Medicine

## 2017-09-01 ENCOUNTER — Ambulatory Visit (INDEPENDENT_AMBULATORY_CARE_PROVIDER_SITE_OTHER): Payer: PPO | Admitting: Family Medicine

## 2017-09-01 VITALS — BP 114/62 | HR 74 | Temp 97.6°F | Resp 16 | Ht 65.0 in | Wt 284.0 lb

## 2017-09-01 VITALS — BP 114/62 | HR 110 | Temp 97.6°F | Resp 16 | Ht 65.0 in | Wt 284.8 lb

## 2017-09-01 DIAGNOSIS — Z1231 Encounter for screening mammogram for malignant neoplasm of breast: Secondary | ICD-10-CM | POA: Diagnosis not present

## 2017-09-01 DIAGNOSIS — J449 Chronic obstructive pulmonary disease, unspecified: Secondary | ICD-10-CM | POA: Diagnosis not present

## 2017-09-01 DIAGNOSIS — Z5181 Encounter for therapeutic drug level monitoring: Secondary | ICD-10-CM | POA: Insufficient documentation

## 2017-09-01 DIAGNOSIS — E7800 Pure hypercholesterolemia, unspecified: Secondary | ICD-10-CM

## 2017-09-01 DIAGNOSIS — H539 Unspecified visual disturbance: Secondary | ICD-10-CM

## 2017-09-01 DIAGNOSIS — Z0000 Encounter for general adult medical examination without abnormal findings: Secondary | ICD-10-CM | POA: Diagnosis not present

## 2017-09-01 DIAGNOSIS — Z1239 Encounter for other screening for malignant neoplasm of breast: Secondary | ICD-10-CM

## 2017-09-01 DIAGNOSIS — R7303 Prediabetes: Secondary | ICD-10-CM

## 2017-09-01 DIAGNOSIS — M170 Bilateral primary osteoarthritis of knee: Secondary | ICD-10-CM | POA: Diagnosis not present

## 2017-09-01 DIAGNOSIS — L309 Dermatitis, unspecified: Secondary | ICD-10-CM

## 2017-09-01 DIAGNOSIS — I10 Essential (primary) hypertension: Secondary | ICD-10-CM

## 2017-09-01 DIAGNOSIS — N183 Chronic kidney disease, stage 3 unspecified: Secondary | ICD-10-CM

## 2017-09-01 DIAGNOSIS — M1A0720 Idiopathic chronic gout, left ankle and foot, without tophus (tophi): Secondary | ICD-10-CM

## 2017-09-01 MED ORDER — TRIAMCINOLONE ACETONIDE 0.1 % EX CREA: TOPICAL_CREAM | CUTANEOUS | 0 refills | Status: DC

## 2017-09-01 MED ORDER — ENALAPRIL MALEATE 10 MG PO TABS: 10.0000 mg | ORAL_TABLET | Freq: Every day | ORAL | 1 refills | Status: DC

## 2017-09-01 MED ORDER — ALLOPURINOL 100 MG PO TABS: 200.0000 mg | ORAL_TABLET | Freq: Every day | ORAL | 1 refills | Status: DC

## 2017-09-01 MED ORDER — LOVASTATIN 40 MG PO TABS: 40.0000 mg | ORAL_TABLET | Freq: Every day | ORAL | 1 refills | Status: DC

## 2017-09-01 MED ORDER — ALBUTEROL SULFATE HFA 108 (90 BASE) MCG/ACT IN AERS: 1.0000 | INHALATION_SPRAY | Freq: Four times a day (QID) | RESPIRATORY_TRACT | 5 refills | Status: DC | PRN

## 2017-09-01 MED ORDER — MECLIZINE HCL 25 MG PO TABS: ORAL_TABLET | ORAL | 0 refills | Status: DC

## 2017-09-01 NOTE — Assessment & Plan Note (Signed)
Well-controlled; glad to see that she is limiting salt

## 2017-09-01 NOTE — Assessment & Plan Note (Signed)
Check liver and kidneys 

## 2017-09-01 NOTE — Progress Notes (Signed)
Subjective:   Colleen [name] is a 72 y.o. female who presents for Medicare Annual (Subsequent) preventive examination.  Review of Systems:  N/A Cardiac Risk Factors include: advanced age (>35men, >66 women);dyslipidemia;hypertension;Other (see comment);obesity (BMI >30kg/m2);sedentary lifestyle, Risk factor comments: prediabetic     Objective:     Vitals: BP 114/62 (BP Location: Left Arm, Patient Position: Sitting, Cuff Size: Normal)   Pulse 74   Temp 97.6 F (36.4 C) (Oral)   Resp 16   Ht 5\' 5"  (1.651 m)   Wt 284 lb (128.8 kg)   BMI 47.26 kg/m   Body mass index is 47.26 kg/m.  Advanced Directives 09/01/2017 11/05/2016 10/29/2016 10/18/2016 10/04/2016 08/16/2016 06/30/2016  Does Patient Have a Medical Advance Directive? No No No No No No No  Type of Advance Directive - - - - - - -  Does patient want to make changes to medical advance directive? - - - - - - -  Copy of [place] in Chart? - - - - - - -  Would patient like information on creating a medical advance directive? Yes (MAU/Ambulatory/Procedural Areas - Information given) No - Patient declined No - Patient declined - - (No Data) -    Tobacco Social History   Tobacco Use  Smoking Status Former Smoker  . Packs/day: 1.50  . Years: 20.00  . Pack years: 30.00  . Types: Cigarettes  . Last attempt to quit: 11/13/2008  . Years since quitting: 8.8  Smokeless Tobacco Never Used  Tobacco Comment   smoking cessation materials not required     Counseling given: No Comment: smoking cessation materials not required  Clinical Intake:  Pre-visit preparation completed: Yes  Pain : No/denies pain   BMI - recorded: 47.26 Nutritional Status: BMI > 30  Obese Nutritional Risks: None Diabetes: No  How often do you need to have someone help you when you read instructions, pamphlets, or other written materials from your doctor or pharmacy?: 1 - Never  Interpreter Needed?: No  Information entered by ::  AEversole, LPN  Past Medical History:  Diagnosis Date  . Asthma   . Cataract   . COPD (chronic obstructive pulmonary disease) ([place])   . GERD (gastroesophageal reflux disease)   . Gout   . Hyperlipidemia   . Hypertension   . Osteoarthrosis    Past Surgical History:  Procedure Laterality Date  . BREAST EXCISIONAL BIOPSY Left 04/08/2014   Intraductal papilloma-5 mm  . LUMBAR LAMINECTOMY    . TONSILLECTOMY     Family History  Problem Relation Age of Onset  . Diabetes Mother   . Lung cancer Father   . Diabetes Sister   . Congestive Heart Failure Sister   . Glaucoma Sister   . Breast cancer Neg Hx    Social History   Socioeconomic History  . Marital status: Widowed    Spouse name: Titus Dubin  . Number of children: 0  . Years of education: Not on file  . Highest education level: 12th grade  Occupational History  . Occupation: Retired  Scientific laboratory technician  . Financial resource strain: Not hard at all  . Food insecurity:    Worry: Never true    Inability: Never true  . Transportation needs:    Medical: No    Non-medical: No  Tobacco Use  . Smoking status: Former Smoker    Packs/day: 1.50    Years: 20.00    Pack years: 30.00    Types: Cigarettes  Last attempt to quit: 11/13/2008    Years since quitting: 8.8  . Smokeless tobacco: Never Used  . Tobacco comment: smoking cessation materials not required  Substance and Sexual Activity  . Alcohol use: Yes    Alcohol/week: 1.0 standard drinks    Types: 1 Cans of beer per week  . Drug use: No  . Sexual activity: Not Currently  Lifestyle  . Physical activity:    Days per week: 0 days    Minutes per session: 0 min  . Stress: Not at all  Relationships  . Social connections:    Talks on phone: Patient refused    Gets together: Patient refused    Attends religious service: Patient refused    Active member of club or organization: Patient refused    Attends meetings of clubs or organizations: Patient refused    Relationship  status: Widowed  Other Topics Concern  . Not on file  Social History Narrative  . Not on file    Outpatient Encounter Medications as of 09/01/2017  Medication Sig  . albuterol (PROVENTIL HFA;VENTOLIN HFA) 108 (90 Base) MCG/ACT inhaler Inhale 1 puff into the lungs every 6 (six) hours as needed for wheezing or shortness of breath.  . allopurinol (ZYLOPRIM) 100 MG tablet Take 2 tablets (200 mg total) by mouth daily.  Marland Kitchen aspirin EC 81 MG tablet Take 81 mg by mouth daily.  . budesonide-formoterol (SYMBICORT) 160-4.5 MCG/ACT inhaler Inhale 2 puffs into the lungs 2 (two) times daily.  . enalapril (VASOTEC) 10 MG tablet Take 1 tablet (10 mg total) by mouth daily.  . fluticasone (FLONASE) 50 MCG/ACT nasal spray Place 2 sprays into both nostrils daily.  . hydrochlorothiazide (MICROZIDE) 12.5 MG capsule Take 1 capsule (12.5 mg total) by mouth daily.  Marland Kitchen lovastatin (MEVACOR) 40 MG tablet Take 1 tablet (40 mg total) by mouth at bedtime.  . meclizine (ANTIVERT) 25 MG tablet TAKE ONE TABLET BY MOUTH THREE TIMES DAILY AS NEEDED FOR DIZZINESS  . simethicone (GAS-X) 80 MG chewable tablet Chew 1 tablet (80 mg total) by mouth 4 (four) times daily as needed for flatulence.  . triamcinolone cream (KENALOG) 0.1 % APPLY CREAM EXTERNALLY TO AFFECTED AREA TWICE DAILY if needed; not more than one week at a time, then take a break for two weeks   No facility-administered encounter medications on file as of 09/01/2017.     Activities of Daily Living In your present state of health, do you have any difficulty performing the following activities: 09/01/2017 09/01/2017  Hearing? N N  Comment denies hearing aids -  Vision? N N  Comment wears eyeglasses -  Difficulty concentrating or making decisions? N N  Walking or climbing stairs? Y Y  Comment avoids stairs, ambulates with walker; knee pain -  Dressing or bathing? N N  Doing errands, shopping? N N  Preparing Food and eating ? N -  Comment denies dentures -  Using the  Toilet? N -  In the past six months, have you accidently leaked urine? N -  Do you have problems with loss of bowel control? N -  Managing your Medications? N -  Managing your Finances? N -  Housekeeping or managing your Housekeeping? N -  Some recent data might be hidden    Patient Care Team: Lada, Satira Anis, MD as PCP - General (Family Medicine) Inda Castle, MD as Consulting Physician (Rheumatology)    Assessment:   This is a routine wellness examination for Colleen [name].  Exercise Activities and  Dietary recommendations Current Exercise Habits: The patient does not participate in regular exercise at present, Exercise limited by: None identified  Goals    . DIET - INCREASE WATER INTAKE     Recommend to drink at least 6-8 8oz glasses of water per day.    . Reduce sugar intake     Continue decreasing sugars in daily diet. Pt to avoid breads, sodas and tea.,        Fall Risk Fall Risk  09/01/2017 09/01/2017 05/31/2017 04/21/2017 01/21/2017  Falls in the past year? No No No No No  Risk for fall due to : Impaired vision;Impaired balance/gait;History of fall(s) - - - -  Risk for fall due to: Comment wears eyeglasses; avoids stairs, ambulates with walker; knee pain - - - -   FALL RISK PREVENTION PERTAINING TO HOME: Is your home free of loose throw rugs in walkways, pet beds, electrical cords, etc? Yes Is there adequate lighting in your home to reduce risk of falls?  Yes Are there stairs in or around your home WITH handrails? No stairs  ASSISTIVE DEVICES UTILIZED TO PREVENT FALLS: Use of a cane, walker or w/c? Yes, walker Grab bars in the bathroom? No  Shower chair or a place to sit while bathing? Yes An elevated toilet seat or a handicapped toilet? Yes  Timed Get Up and Go Performed: Yes. Pt ambulated 10 feet within 32 sec. Gait slow, unsteady and with the use of an assistive device. Declined my offer to refer to PT for strengthening and ambulatory retraining. Also declined my offer for  Life Alert. Fall risk prevention has been discussed.  Community Resource Referral:  Pt declined my offer to send [employer] Referral to Care Guide for installation of grab bars in the shower.  Depression Screen PHQ 2/9 Scores 09/01/2017 09/01/2017 05/31/2017 04/21/2017  PHQ - 2 Score 0 0 0 0  PHQ- 9 Score 0 - - -     Cognitive Function     6CIT Screen 09/01/2017 08/16/2016  What Year? 0 points 0 points  What month? 0 points 0 points  What time? 0 points 0 points  Count back from 20 4 points 0 points  Months in reverse 0 points 0 points  Repeat phrase 6 points 8 points  Total Score 10 8    Immunization History  Administered Date(s) Administered  . Influenza, High Dose Seasonal PF 10/04/2016  . Influenza-Unspecified 10/26/2014, 09/27/2015  . Pneumococcal Conjugate-13 02/21/2013  . Pneumococcal Polysaccharide-23 10/13/2011  . Tdap 10/13/2011    Qualifies for Shingles Vaccine? Yes. Due for Shingrix. Education has been provided regarding the importance of this vaccine. Pt has been advised to call insurance company to determine out of pocket expense. Advised may also receive vaccine at local pharmacy or Health Dept. Verbalized acceptance and understanding.  Screening Tests Health Maintenance  Topic Date Due  . INFLUENZA VACCINE  08/25/2017  . MAMMOGRAM  11/09/2017  . Fecal DNA (Cologuard)  10/21/2019  . TETANUS/TDAP  10/12/2021  . DEXA SCAN  Completed  . Hepatitis C Screening  Completed  . PNA vac Low Risk Adult  Completed    Cancer Screenings: Lung: Low Dose CT Chest recommended if Age 67-80 years, 30 pack-year currently smoking OR have quit w/in 15years. Patient does not qualify. Breast:  Up to date on Mammogram? Yes. Completed 11/09/16. Repeat every year. Ordered today. Provided with contact information to schedule appt.   Up to date of Bone Density/Dexa? Yes. Completed 05/18/17. Results reflect osteopenia. Repeat  every 2 years Colorectal: Completed Cologuard 10/20/16.  Repeat every 3 years  Additional Screenings: Hepatitis C Screening: Completed 02/21/13    Plan:  I have personally reviewed and addressed the Medicare Annual Wellness questionnaire and have noted the following in the patient's chart:  A. Medical and social history B. Use of alcohol, tobacco or illicit drugs  C. Current medications and supplements D. Functional ability and status E.  Nutritional status F.  Physical activity G. Advance directives H. List of other physicians I.  Hospitalizations, surgeries, and ER visits in previous 12 months J.  [place] such as hearing and vision if needed, cognitive and depression L. Referrals and appointments  In addition, I have reviewed and discussed with patient certain preventive protocols, quality metrics, and best practice recommendations. A written personalized care plan for preventive services as well as general preventive health recommendations were provided to patient.  See attached scanned questionnaire for additional information.   Signed,  Aleatha Borer, LPN Nurse Health Advisor

## 2017-09-01 NOTE — Assessment & Plan Note (Signed)
Stable, chronic problem; refills allowed

## 2017-09-01 NOTE — Assessment & Plan Note (Signed)
Only tylenol; no NSAIDs; seeing rheumatologist

## 2017-09-01 NOTE — Assessment & Plan Note (Signed)
Keep up the good work with weight loss; check glucose and A1c today; just water

## 2017-09-01 NOTE — Patient Instructions (Signed)
Colleen [name] , Thank you for taking time to come for your Medicare Wellness Visit. I appreciate your ongoing commitment to your health goals. Please review the following plan we discussed and let me know if I can assist you in the future.   Screening recommendations/referrals: Colorectal Screening: Up to date Mammogram: Up to date Bone Density: Up to date  Vision and Dental Exams: Recommended annual ophthalmology exams for early detection of glaucoma and other disorders of the eye Recommended annual dental exams for proper oral hygiene  Vaccinations: Influenza vaccine: Up to date Pneumococcal vaccine: Up to date Tdap vaccine: Up to date Shingles vaccine: Please call your insurance company to determine your out of pocket expense for the Shingrix vaccine. You may also receive this vaccine at your local pharmacy or Health Dept.    Advanced directives: Advance directive discussed with you today. I have provided a copy for you to complete at home and have notarized. Once this is complete please bring a copy in to our office so we can scan it into your chart.  Goals: Recommend to drink at least 6-8 8oz glasses of water per day.  Next appointment: Please schedule your Annual Wellness Visit with your Nurse Health Advisor in one year.  Preventive Care 71 Years and Older, Female Preventive care refers to lifestyle choices and visits with your health care provider that can promote health and wellness. What does preventive care include?  A yearly physical exam. This is also called an annual well check.  Dental exams once or twice a year.  Routine eye exams. Ask your health care provider how often you should have your eyes checked.  Personal lifestyle choices, including:  Daily care of your teeth and gums.  Regular physical activity.  Eating a healthy diet.  Avoiding tobacco and drug use.  Limiting alcohol use.  Practicing safe sex.  Taking low-dose aspirin every day.  Taking  vitamin and mineral supplements as recommended by your health care provider. What happens during an annual well check? The services and screenings done by your health care provider during your annual well check will depend on your age, overall health, lifestyle risk factors, and family history of disease. Counseling  Your health care provider may ask you questions about your:  Alcohol use.  Tobacco use.  Drug use.  Emotional well-being.  Home and relationship well-being.  Sexual activity.  Eating habits.  History of falls.  Memory and ability to understand (cognition).  Work and work Statistician.  Reproductive health. Screening  You may have the following tests or measurements:  Height, weight, and BMI.  Blood pressure.  Lipid and cholesterol levels. These may be checked every 5 years, or more frequently if you are over 71 years old.  Skin check.  Lung cancer screening. You may have this screening every year starting at age 71 if you have a 30-pack-year history of smoking and currently smoke or have quit within the past 15 years.  Fecal occult blood test (FOBT) of the stool. You may have this test every year starting at age 71.  Flexible sigmoidoscopy or colonoscopy. You may have a sigmoidoscopy every 5 years or a colonoscopy every 10 years starting at age 71.  Hepatitis C blood test.  Hepatitis B blood test.  Sexually transmitted disease (STD) testing.  Diabetes screening. This is done by checking your blood sugar (glucose) after you have not eaten for a while (fasting). You may have this done every 1-3 years.  Bone density scan. This  is done to screen for osteoporosis. You may have this done starting at age 71.  Mammogram. This may be done every 1-2 years. Talk to your health care provider about how often you should have regular mammograms. Talk with your health care provider about your test results, treatment options, and if necessary, the need for more  tests. Vaccines  Your health care provider may recommend certain vaccines, such as:  Influenza vaccine. This is recommended every year.  Tetanus, diphtheria, and acellular pertussis (Tdap, Td) vaccine. You may need a Td booster every 10 years.  Zoster vaccine. You may need this after age 71.  Pneumococcal 13-valent conjugate (PCV13) vaccine. One dose is recommended after age 71.  Pneumococcal polysaccharide (PPSV23) vaccine. One dose is recommended after age 71. Talk to your health care provider about which screenings and vaccines you need and how often you need them. This information is not intended to replace advice given to you by your health care provider. Make sure you discuss any questions you have with your health care provider. Document Released: 02/07/2015 Document Revised: 10/01/2015 Document Reviewed: 11/12/2014 Elsevier Interactive Patient Education  2017 [place] Prevention in the Home Falls can cause injuries. They can happen to people of all ages. There are many things you can do to make your home safe and to help prevent falls. What can I do on the outside of my home?  Regularly fix the edges of walkways and driveways and fix any cracks.  Remove anything that might make you trip as you walk through a door, such as a raised step or threshold.  Trim any bushes or trees on the path to your home.  Use bright outdoor lighting.  Clear any walking paths of anything that might make someone trip, such as rocks or tools.  Regularly check to see if handrails are loose or broken. Make sure that both sides of any steps have handrails.  Any raised decks and porches should have guardrails on the edges.  Have any leaves, snow, or ice cleared regularly.  Use sand or salt on walking paths during winter.  Clean up any spills in your garage right away. This includes oil or grease spills. What can I do in the bathroom?  Use night lights.  Install grab bars by the  toilet and in the tub and shower. Do not use towel bars as grab bars.  Use non-skid mats or decals in the tub or shower.  If you need to sit down in the shower, use a plastic, non-slip stool.  Keep the floor dry. Clean up any water that spills on the floor as soon as it happens.  Remove soap buildup in the tub or shower regularly.  Attach bath mats securely with double-sided non-slip rug tape.  Do not have throw rugs and other things on the floor that can make you trip. What can I do in the bedroom?  Use night lights.  Make sure that you have a light by your bed that is easy to reach.  Do not use any sheets or blankets that are too big for your bed. They should not hang down onto the floor.  Have a firm chair that has side arms. You can use this for support while you get dressed.  Do not have throw rugs and other things on the floor that can make you trip. What can I do in the kitchen?  Clean up any spills right away.  Avoid walking on wet floors.  Keep items that you use a lot in easy-to-reach places.  If you need to reach something above you, use a strong step stool that has a grab bar.  Keep electrical cords out of the way.  Do not use floor polish or wax that makes floors slippery. If you must use wax, use non-skid floor wax.  Do not have throw rugs and other things on the floor that can make you trip. What can I do with my stairs?  Do not leave any items on the stairs.  Make sure that there are handrails on both sides of the stairs and use them. Fix handrails that are broken or loose. Make sure that handrails are as long as the stairways.  Check any carpeting to make sure that it is firmly attached to the stairs. Fix any carpet that is loose or worn.  Avoid having throw rugs at the top or bottom of the stairs. If you do have throw rugs, attach them to the floor with carpet tape.  Make sure that you have a light switch at the top of the stairs and the bottom of  the stairs. If you do not have them, ask someone to add them for you. What else can I do to help prevent falls?  Wear shoes that:  Do not have high heels.  Have rubber bottoms.  Are comfortable and fit you well.  Are closed at the toe. Do not wear sandals.  If you use a stepladder:  Make sure that it is fully opened. Do not climb a closed stepladder.  Make sure that both sides of the stepladder are locked into place.  Ask someone to hold it for you, if possible.  Clearly mark and make sure that you can see:  Any grab bars or handrails.  First and last steps.  Where the edge of each step is.  Use tools that help you move around (mobility aids) if they are needed. These include:  Canes.  Walkers.  Scooters.  Crutches.  Turn on the lights when you go into a dark area. Replace any light bulbs as soon as they burn out.  Set up your furniture so you have a clear path. Avoid moving your furniture around.  If any of your floors are uneven, fix them.  If there are any pets around you, be aware of where they are.  Review your medicines with your doctor. Some medicines can make you feel dizzy. This can increase your chance of falling. Ask your doctor what other things that you can do to help prevent falls. This information is not intended to replace advice given to you by your health care provider. Make sure you discuss any questions you have with your health care provider. Document Released: 11/07/2008 Document Revised: 06/19/2015 Document Reviewed: 02/15/2014 Elsevier Interactive Patient Education  2017 [employer].

## 2017-09-01 NOTE — Assessment & Plan Note (Signed)
Down 8 pounds over the last 5 months; fantastic, praise given, keep up the good work; 1-2 pounds a month

## 2017-09-01 NOTE — Patient Instructions (Signed)
We'll get labs drawn today If you have not heard anything from my staff in a week about any orders/referrals/studies from today, please contact us here to follow-up [phone] Check out the information at family[facility].[email or web address] entitled "Nutrition for Weight Loss: What You Need to Know about Fad Diets" Try to lose between 1-2 pounds per week by taking in fewer calories and burning off more calories You can succeed by limiting portions, limiting foods dense in calories and fat, becoming more active, and drinking 8 glasses of water a day (64 ounces) Don't skip meals, especially breakfast, as skipping meals may alter your metabolism Do not use over-the-counter weight loss pills or gimmicks that claim rapid weight loss A healthy BMI (or body mass index) is between 18.5 and 24.9 You can calculate your ideal BMI at the [place] website [employer]fr If you need something for aches or pains, try to use Tylenol (acetaminophen) instead of non-steroidals (which include Aleve, ibuprofen, Advil, Motrin, and naproxen); non-steroidals can cause long-term kidney damage

## 2017-09-01 NOTE — Assessment & Plan Note (Signed)
Check Cr; avoid NSAIDs

## 2017-09-01 NOTE — Progress Notes (Signed)
BP 114/62 (BP Location: Left Arm, Patient Position: Sitting, Cuff Size: Normal)   Pulse (!) 110   Temp 97.6 F (36.4 C) (Oral)   Resp 16   Ht 5\' 5"  (1.651 m)   Wt 284 lb 12.8 oz (129.2 kg)   SpO2 98%   BMI 47.39 kg/m    Subjective:    Patient ID: Colleen [name], female    DOB: Dec 11, 1945, 72 y.o.   MRN: [id number]  HPI: Colleen [name] is a 72 y.o. female  Chief Complaint  Patient presents with  . Hypertension  . Osteoarthritis    HPI Patient is here for f/u Hypertension; not checking BP away from here; not adding much salt to her food; limiting hot dogs; likes her bologna  Prediabetes; no dry mouth; no sugary drinks Lab Results  Component Value Date   HGBA1C 6.0 (H) 05/23/2017   Gout; no flares lately, allopurinol is working  Arthritis; rolling walker; I asked if anything needed at home; railings up to the steps; one level home  Hyperlipidemia; limiting fatty meats for the most part; not much cheese  COPD; it's been okay with the heat and humidity; she just stays in; has air conditioning  Morbid obesity; she is down 8 pounds since March  CKD, stage 3, no NSIADs, just tylenol  Depression screen [facility] 2/9 09/01/2017 05/31/2017 04/21/2017 01/21/2017 10/18/2016  Decreased Interest 0 0 0 0 0  Down, Depressed, Hopeless 0 0 0 0 0  PHQ - 2 Score 0 0 0 0 0    Relevant past medical, surgical, family and social history reviewed Past Medical History:  Diagnosis Date  . Asthma   . Cataract   . COPD (chronic obstructive pulmonary disease) ([place])   . GERD (gastroesophageal reflux disease)   . Gout   . Hyperlipidemia   . Hypertension   . Osteoarthrosis    Past Surgical History:  Procedure Laterality Date  . BREAST EXCISIONAL BIOPSY Left 04/08/2014   Intraductal papilloma-5 mm  . LUMBAR LAMINECTOMY    . TONSILLECTOMY     Family History  Problem Relation Age of Onset  . Diabetes Mother   . Lung cancer Father   . Diabetes Sister   . Congestive Heart Failure Sister     . Glaucoma Sister   . Breast cancer Neg Hx    Social History   Tobacco Use  . Smoking status: Former Smoker    Last attempt to quit: 11/13/2008    Years since quitting: 8.8  . Smokeless tobacco: Never Used  Substance Use Topics  . Alcohol use: Yes    Alcohol/week: 6.0 standard drinks    Types: 6 Cans of beer per week    Comment: 6 beers a week sometimes  . Drug use: No    Interim medical history since last visit reviewed. Allergies and medications reviewed  Review of Systems Per HPI unless specifically indicated above     Objective:    BP 114/62 (BP Location: Left Arm, Patient Position: Sitting, Cuff Size: Normal)   Pulse (!) 110   Temp 97.6 F (36.4 C) (Oral)   Resp 16   Ht 5\' 5"  (1.651 m)   Wt 284 lb 12.8 oz (129.2 kg)   SpO2 98%   BMI 47.39 kg/m   Wt Readings from Last 3 Encounters:  09/01/17 284 lb 12.8 oz (129.2 kg)  05/31/17 288 lb 3.2 oz (130.7 kg)  04/21/17 292 lb 12.8 oz (132.8 kg)    Physical Exam  Constitutional:  She appears well-developed and well-nourished. No distress.  Morbidly obese, but weight loss acknowledged  HENT:  Head: Normocephalic and atraumatic.  Eyes: EOM are normal. No scleral icterus.  Neck: No thyromegaly present.  Cardiovascular: Normal rate, regular rhythm and normal heart sounds.  No murmur heard. Heart rate under 100 during auscultation with examiner  Pulmonary/Chest: Effort normal and breath sounds normal. No respiratory distress. She has no wheezes.  Abdominal: Soft. Bowel sounds are normal. She exhibits no distension.  Musculoskeletal: She exhibits no edema.  Neurological: She is alert.  Skin: Skin is warm and dry. She is not diaphoretic. No pallor.  Psychiatric: She has a normal mood and affect. Her behavior is normal. Judgment and thought content normal.    Results for orders placed or performed in visit on 05/23/17  Lipid panel  Result Value Ref Range   Cholesterol 158 <200 mg/dL   HDL 67 >50 mg/dL    Triglycerides 73 <150 mg/dL   LDL Cholesterol (Calc) 76 mg/dL (calc)   Total CHOL/HDL Ratio 2.4 <5.0 (calc)   Non-HDL Cholesterol (Calc) 91 <130 mg/dL (calc)  Hemoglobin A1c  Result Value Ref Range   Hgb A1c MFr Bld 6.0 (H) <5.7 % of total Hgb   Mean Plasma Glucose 126 (calc)   eAG (mmol/L) 7.0 (calc)  COMPLETE METABOLIC PANEL WITH GFR  Result Value Ref Range   Glucose, Bld 99 65 - 99 mg/dL   BUN 22 7 - 25 mg/dL   Creat 1.18 (H) 0.60 - 0.93 mg/dL   GFR, Est Non African American 46 (L) > OR = 60 mL/min/1.21m2   GFR, Est African American 54 (L) > OR = 60 mL/min/1.71m2   BUN/Creatinine Ratio 19 6 - 22 (calc)   Sodium 137 135 - 146 mmol/L   Potassium 4.4 3.5 - 5.3 mmol/L   Chloride 100 98 - 110 mmol/L   CO2 32 20 - 32 mmol/L   Calcium 9.6 8.6 - 10.4 mg/dL   Total Protein 7.6 6.1 - 8.1 g/dL   Albumin 3.7 3.6 - 5.1 g/dL   Globulin 3.9 (H) 1.9 - 3.7 g/dL (calc)   AG Ratio 0.9 (L) 1.0 - 2.5 (calc)   Total Bilirubin 0.6 0.2 - 1.2 mg/dL   Alkaline phosphatase (APISO) 107 33 - 130 U/L   AST 16 10 - 35 U/L   ALT 6 6 - 29 U/L  CBC with Differential/Platelet  Result Value Ref Range   WBC 6.5 3.8 - 10.8 Thousand/uL   RBC 4.46 3.80 - 5.10 Million/uL   Hemoglobin 13.1 11.7 - 15.5 g/dL   HCT 38.7 35.0 - 45.0 %   MCV 86.8 80.0 - 100.0 fL   MCH 29.4 27.0 - 33.0 pg   MCHC 33.9 32.0 - 36.0 g/dL   RDW 12.9 11.0 - 15.0 %   Platelets 336 140 - 400 Thousand/uL   MPV 10.6 7.5 - 12.5 fL   Neutro Abs 2,893 1,500 - 7,800 cells/uL   Lymphs Abs 2,880 850 - 3,900 cells/uL   WBC mixed population 501 200 - 950 cells/uL   Eosinophils Absolute 137 15 - 500 cells/uL   Basophils Absolute 91 0 - 200 cells/uL   Neutrophils Relative % 44.5 %   Total Lymphocyte 44.3 %   Monocytes Relative 7.7 %   Eosinophils Relative 2.1 %   Basophils Relative 1.4 %  TSH  Result Value Ref Range   TSH 3.86 0.40 - 4.50 mIU/L      Assessment & Plan:   Problem  List Items Addressed This Visit      Cardiovascular and  Mediastinum   Benign essential HTN    Well-controlled; glad to see that she is limiting salt      Relevant Medications   enalapril (VASOTEC) 10 MG tablet   lovastatin (MEVACOR) 40 MG tablet     Respiratory   COPD (chronic obstructive pulmonary disease) (HCC) - Primary (Chronic)    Stable, chronic problem; refills allowed      Relevant Medications   albuterol (PROVENTIL HFA;VENTOLIN HFA) 108 (90 Base) MCG/ACT inhaler     Musculoskeletal and Integument   Arthritis, degenerative    Only tylenol; no NSAIDs; seeing rheumatologist      Relevant Medications   allopurinol (ZYLOPRIM) 100 MG tablet     Genitourinary   Chronic kidney disease, stage III (moderate) (HCC)    Check Cr; avoid NSAIDs        Other   Prediabetes    Keep up the good work with weight loss; check glucose and A1c today; just water      Relevant Orders   Hemoglobin A1c   Morbid obesity (HCC)    Down 8 pounds over the last 5 months; fantastic, praise given, keep up the good work; 1-2 pounds a month      Medication monitoring encounter    Check liver and kidneys      Relevant Orders   COMPLETE METABOLIC PANEL WITH GFR   HLD (hyperlipidemia)    Check lipids, limit fatty meats      Relevant Medications   enalapril (VASOTEC) 10 MG tablet   lovastatin (MEVACOR) 40 MG tablet   Other Relevant Orders   Lipid panel   RESOLVED: Gout    Avoid gravy; continue allopurinol, monitor uric acid and Cr      Relevant Medications   allopurinol (ZYLOPRIM) 100 MG tablet   Other Relevant Orders   Uric acid    Other Visit Diagnoses    Dermatitis of face       Relevant Medications   triamcinolone cream (KENALOG) 0.1 %       Follow up plan: Return in about 6 months (around 03/04/2018) for follow-up visit with Dr. Sanda Klein.  An after-visit summary was printed and given to the patient at [place].  Please see the patient instructions which may contain other information and recommendations beyond what is mentioned  above in the assessment and plan.  Meds ordered this encounter  Medications  . albuterol (PROVENTIL HFA;VENTOLIN HFA) 108 (90 Base) MCG/ACT inhaler    Sig: Inhale 1 puff into the lungs every 6 (six) hours as needed for wheezing or shortness of breath.    Dispense:  1 Inhaler    Refill:  5  . allopurinol (ZYLOPRIM) 100 MG tablet    Sig: Take 2 tablets (200 mg total) by mouth daily.    Dispense:  180 tablet    Refill:  1  . enalapril (VASOTEC) 10 MG tablet    Sig: Take 1 tablet (10 mg total) by mouth daily.    Dispense:  90 tablet    Refill:  1  . lovastatin (MEVACOR) 40 MG tablet    Sig: Take 1 tablet (40 mg total) by mouth at bedtime.    Dispense:  90 tablet    Refill:  1  . meclizine (ANTIVERT) 25 MG tablet    Sig: TAKE ONE TABLET BY MOUTH THREE TIMES DAILY AS NEEDED FOR DIZZINESS    Dispense:  30 tablet    Refill:  0  . triamcinolone cream (KENALOG) 0.1 %    Sig: APPLY CREAM EXTERNALLY TO AFFECTED AREA TWICE DAILY if needed; not more than one week at a time, then take a break for two weeks    Dispense:  45 g    Refill:  0    Orders Placed This Encounter  Procedures  . COMPLETE METABOLIC PANEL WITH GFR  . Uric acid  . Lipid panel  . Hemoglobin A1c

## 2017-09-01 NOTE — Assessment & Plan Note (Signed)
Avoid gravy; continue allopurinol, monitor uric acid and Cr

## 2017-09-01 NOTE — Assessment & Plan Note (Signed)
Check lipids, limit fatty meats 

## 2017-09-02 LAB — COMPLETE METABOLIC PANEL WITH GFR
AG RATIO: 0.9 (calc) — AB (ref 1.0–2.5)
ALT: 8 U/L (ref 6–29)
AST: 15 U/L (ref 10–35)
Albumin: 3.8 g/dL (ref 3.6–5.1)
Alkaline phosphatase (APISO): 124 U/L (ref 33–130)
BUN/Creatinine Ratio: 22 (calc) (ref 6–22)
BUN: 25 mg/dL (ref 7–25)
CALCIUM: 9.9 mg/dL (ref 8.6–10.4)
CO2: 29 mmol/L (ref 20–32)
Chloride: 101 mmol/L (ref 98–110)
Creat: 1.12 mg/dL — ABNORMAL HIGH (ref 0.60–0.93)
GFR, EST AFRICAN AMERICAN: 57 mL/min/{1.73_m2} — AB (ref >=60)
GFR, EST NON AFRICAN AMERICAN: 49 mL/min/{1.73_m2} — AB (ref >=60)
GLOBULIN: 4.1 g/dL — AB (ref 1.9–3.7)
Glucose, Bld: 109 mg/dL — ABNORMAL HIGH (ref 65–99)
POTASSIUM: 4.3 mmol/L (ref 3.5–5.3)
SODIUM: 135 mmol/L (ref 135–146)
TOTAL PROTEIN: 7.9 g/dL (ref 6.1–8.1)
Total Bilirubin: 0.4 mg/dL (ref 0.2–1.2)

## 2017-09-02 LAB — HEMOGLOBIN A1C
EAG (MMOL/L): 7.3 (calc)
Hgb A1c MFr Bld: 6.2 % of total Hgb — ABNORMAL HIGH (ref <5.7)
MEAN PLASMA GLUCOSE: 131 (calc)

## 2017-09-02 LAB — URIC ACID: Uric Acid, Serum: 5 mg/dL (ref 2.5–7.0)

## 2017-09-02 LAB — LIPID PANEL
CHOL/HDL RATIO: 2.4 (calc) (ref <5.0)
Cholesterol: 141 mg/dL (ref <200)
HDL: 59 mg/dL (ref >50)
LDL Cholesterol (Calc): 66 mg/dL (calc)
NON-HDL CHOLESTEROL (CALC): 82 mg/dL (ref <130)
Triglycerides: 76 mg/dL (ref <150)

## 2017-09-13 ENCOUNTER — Telehealth: Payer: Self-pay

## 2017-09-13 DIAGNOSIS — Z598 Other problems related to housing and economic circumstances: Secondary | ICD-10-CM

## 2017-09-13 DIAGNOSIS — Z599 Problem related to housing and economic circumstances, unspecified: Secondary | ICD-10-CM

## 2017-09-13 DIAGNOSIS — H2511 Age-related nuclear cataract, right eye: Secondary | ICD-10-CM | POA: Diagnosis not present

## 2017-09-13 NOTE — Telephone Encounter (Signed)
Copied from [place] [id number]. Topic: Inquiry >> Sep 13, 2017 10:12 AM Scherrie Gerlach wrote:  Reason for CRM: pt states the wellness nurse advised her to let her know when she got her eye exam (at [facility]) and glasses, because she would help her pay for them. Pt went today, and the glasses cost $139.00. Pt had to pay $70.00 deposit, and she paid that.  Pt states her glasses will be back in 10 days, and hopes you can pay that prior to then. Please advise  Referral to [facility] placed for financial assistance with eyeglasses.

## 2017-10-11 ENCOUNTER — Telehealth: Payer: Self-pay | Admitting: Family Medicine

## 2017-10-11 DIAGNOSIS — I10 Essential (primary) hypertension: Secondary | ICD-10-CM

## 2017-10-11 MED ORDER — HYDROCHLOROTHIAZIDE 12.5 MG PO CAPS: 12.5000 mg | ORAL_CAPSULE | Freq: Every day | ORAL | 1 refills | Status: DC

## 2017-10-11 NOTE — Telephone Encounter (Signed)
Copied from [place] [id number]. Topic: Quick Communication - Rx Refill/Question >> Oct 11, 2017 10:43 AM Celedonio Savage L wrote: Medication: hydrochlorothiazide (MICROZIDE) 12.5 MG capsule  Has the patient contacted their pharmacy? No.  (Agent: If no, request that the patient contact the pharmacy for the refill.) (Agent: If yes, when and what did the pharmacy advise?)  Preferred Pharmacy (with phone number or street name):   [facility] (N), [place] - [place] [phone] (Phone) [phone] (Fax)    Agent: Please be advised that RX refills may take up to 3 business days. We ask that you follow-up with your pharmacy.

## 2017-10-11 NOTE — Telephone Encounter (Signed)
Microzide 12.5 refill Last Refill:04/21/17 # 90 Last OV: 03/2817 PCP: [facility]

## 2017-10-15 DIAGNOSIS — J069 Acute upper respiratory infection, unspecified: Secondary | ICD-10-CM | POA: Diagnosis not present

## 2017-10-15 DIAGNOSIS — J441 Chronic obstructive pulmonary disease with (acute) exacerbation: Secondary | ICD-10-CM | POA: Diagnosis not present

## 2017-10-21 ENCOUNTER — Other Ambulatory Visit: Payer: Self-pay

## 2017-10-21 DIAGNOSIS — J449 Chronic obstructive pulmonary disease, unspecified: Secondary | ICD-10-CM

## 2017-10-21 NOTE — Telephone Encounter (Signed)
Refill request was sent to Dr. Melinda Lada for approval and submission.  

## 2017-10-21 NOTE — Telephone Encounter (Signed)
Please see the last prescription I approved 1 inhaler plus 5 refills on 09/01/2017 She should not be out  albuterol (PROVENTIL HFA;VENTOLIN HFA) 108 (90 Base) MCG/ACT inhaler 1 puff, Every 6 hours PRN 5 ordered  EditCancel Reorder      Summary: Inhale 1 puff into the lungs every 6 (six) hours as needed for wheezing or shortness of breath., Starting Thu 09/01/2017, Normal  Dose, Route, Frequency: 1 puff, Inhalation, Every 6 hours PRN Start: 09/01/2017 Ord/Sold: 09/01/2017 (O) Report Taking:  Long-term:  Pharmacy: [facility] ([facility]), [place] - [address] Med Dose History     Patient Sig: Inhale 1 puff into the lungs every 6 (six) hours as needed for wheezing or shortness of breath.     Ordered on: 09/01/2017     Authorized by: Jamiee Milholland P     Dispense: 1 Inhaler

## 2017-10-30 ENCOUNTER — Emergency Department
Admission: EM | Admit: 2017-10-30 | Discharge: 2017-10-30 | Disposition: A | Discharge status: Home / Self Care | Payer: PPO | Attending: Emergency Medicine | Admitting: Emergency Medicine

## 2017-10-30 ENCOUNTER — Other Ambulatory Visit: Payer: Self-pay

## 2017-10-30 ENCOUNTER — Encounter: Payer: Self-pay | Admitting: Emergency Medicine

## 2017-10-30 ENCOUNTER — Emergency Department: Payer: PPO

## 2017-10-30 DIAGNOSIS — R42 Dizziness and giddiness: Secondary | ICD-10-CM | POA: Insufficient documentation

## 2017-10-30 DIAGNOSIS — Z87891 Personal history of nicotine dependence: Secondary | ICD-10-CM | POA: Diagnosis not present

## 2017-10-30 DIAGNOSIS — Z79899 Other long term (current) drug therapy: Secondary | ICD-10-CM | POA: Diagnosis not present

## 2017-10-30 DIAGNOSIS — R7303 Prediabetes: Secondary | ICD-10-CM | POA: Diagnosis not present

## 2017-10-30 DIAGNOSIS — Z7982 Long term (current) use of aspirin: Secondary | ICD-10-CM | POA: Diagnosis not present

## 2017-10-30 DIAGNOSIS — I10 Essential (primary) hypertension: Secondary | ICD-10-CM | POA: Insufficient documentation

## 2017-10-30 DIAGNOSIS — J209 Acute bronchitis, unspecified: Secondary | ICD-10-CM | POA: Insufficient documentation

## 2017-10-30 DIAGNOSIS — J441 Chronic obstructive pulmonary disease with (acute) exacerbation: Secondary | ICD-10-CM | POA: Insufficient documentation

## 2017-10-30 DIAGNOSIS — R0602 Shortness of breath: Secondary | ICD-10-CM | POA: Diagnosis not present

## 2017-10-30 DIAGNOSIS — J440 Chronic obstructive pulmonary disease with acute lower respiratory infection: Secondary | ICD-10-CM | POA: Diagnosis not present

## 2017-10-30 LAB — CBC
HEMATOCRIT: 39 % (ref 35.0–47.0)
HEMOGLOBIN: 12.7 g/dL (ref 12.0–16.0)
MCH: 29.2 pg (ref 26.0–34.0)
MCHC: 32.5 g/dL (ref 32.0–36.0)
MCV: 89.9 fL (ref 80.0–100.0)
Platelets: 294 10*3/uL (ref 150–440)
RBC: 4.34 MIL/uL (ref 3.80–5.20)
RDW: 15 % — ABNORMAL HIGH (ref 11.5–14.5)
WBC: 7.3 10*3/uL (ref 3.6–11.0)

## 2017-10-30 LAB — BASIC METABOLIC PANEL
Anion gap: 7 (ref 5–15)
BUN: 20 mg/dL (ref 8–23)
CHLORIDE: 103 mmol/L (ref 98–111)
CO2: 28 mmol/L (ref 22–32)
Calcium: 9.3 mg/dL (ref 8.9–10.3)
Creatinine, Ser: 1.06 mg/dL — ABNORMAL HIGH (ref 0.44–1.00)
GFR calc Af Amer: 60 mL/min — ABNORMAL LOW (ref >60)
GFR calc non Af Amer: 52 mL/min — ABNORMAL LOW (ref >60)
Glucose, Bld: 119 mg/dL — ABNORMAL HIGH (ref 70–99)
Potassium: 4 mmol/L (ref 3.5–5.1)
Sodium: 138 mmol/L (ref 135–145)

## 2017-10-30 LAB — INFLUENZA PANEL BY PCR (TYPE A & B)
Influenza A By PCR: NEGATIVE
Influenza B By PCR: NEGATIVE

## 2017-10-30 MED ORDER — PREDNISONE 20 MG PO TABS
40.0000 mg | ORAL_TABLET | Freq: Once | ORAL | Status: AC
  Administered 2017-10-30: 40 mg via ORAL
  Filled 2017-10-30: qty 2

## 2017-10-30 MED ORDER — AZITHROMYCIN 500 MG PO TABS
500.0000 mg | ORAL_TABLET | Freq: Once | ORAL | Status: AC
  Administered 2017-10-30: 500 mg via ORAL
  Filled 2017-10-30: qty 1

## 2017-10-30 MED ORDER — AZITHROMYCIN 250 MG PO TABS: ORAL_TABLET | ORAL | 0 refills | Status: DC

## 2017-10-30 MED ORDER — IPRATROPIUM-ALBUTEROL 0.5-2.5 (3) MG/3ML IN SOLN
3.0000 mL | Freq: Once | RESPIRATORY_TRACT | Status: AC
  Administered 2017-10-30: 3 mL via RESPIRATORY_TRACT
  Filled 2017-10-30: qty 3

## 2017-10-30 MED ORDER — PREDNISONE 20 MG PO TABS: 40.0000 mg | ORAL_TABLET | Freq: Every day | ORAL | 0 refills | Status: DC

## 2017-10-30 NOTE — ED Triage Notes (Signed)
Pt to ED via POV c/o shortness of breath and dizziness x 2 months. Pt states that she was seen at urgent care 2 weeks ago. Pt states that she was given prednisone and antibiotic and cough pearls. Pt states that once she gets the phlegm up the shortness of breath gets better. Pt states that when she stands up she also get dizzy. Pt is in NAD at this time.

## 2017-10-30 NOTE — ED Provider Notes (Signed)
[facility] Emergency Department Provider Note   ____________________________________________   First MD Initiated Contact with Patient 10/30/17 1114     (approximate)  I have reviewed the triage vital signs and the nursing notes.   HISTORY  Chief Complaint Shortness of Breath and Dizziness    HPI Colleen [name] is a 72 y.o. female history of COPD and hypertension  Patient reports that about 3 weeks ago she developed upper respiratory cough and congestion went to urgent care and was treated with 4 days of steroid and also a small green antibiotic.  Not quite certain of the urgent care she went to, and does not know which antibiotic exactly.  She reports she got better.  This morning when she got up she started noticing that she was having nasal congestion, cough and having some slight wheezing again.  She also felt short of breath when she was preparing breakfast.  She used her inhaler at home and reports that that provided some relief but she thinks her symptoms are coming back again.  She reports last time she is at urgent care she did not get an x-ray, and wanted to make sure she does not have anything like pneumonia  She has had a slightly productive cough, but reports is more of a thick yellow phlegm and sputum.  Feeling a little bit warm at times, but has not had a fever.  No chest pain.  Reports shortness of breath is better now, but was wheezing and felt short of breath prior to using inhaler   Also has felt a little lightheaded with standing since this morning. Past Medical History:  Diagnosis Date  . Asthma   . Cataract   . COPD (chronic obstructive pulmonary disease) ([place])   . GERD (gastroesophageal reflux disease)   . Gout   . Hyperlipidemia   . Hypertension   . Osteoarthrosis     Patient Active Problem List   Diagnosis Date Noted  . Medication monitoring encounter 09/01/2017  . Osteopenia 05/18/2017  . Chronic kidney disease, stage III  (moderate) ([place]) 04/21/2017  . Morbid obesity ([place]) 04/21/2017  . Other allergic rhinitis 01/10/2015  . Allergic rhinitis, seasonal 07/30/2014  . Airway hyperreactivity 07/30/2014  . HLD (hyperlipidemia) 07/30/2014  . Encounter for immunization 07/30/2014  . Arthritis, degenerative 07/30/2014  . Prediabetes 07/30/2014  . Asthma, mild persistent 07/30/2014  . COPD (chronic obstructive pulmonary disease) ([place]) 07/09/2008  . Benign essential HTN 06/22/2006    Past Surgical History:  Procedure Laterality Date  . BREAST EXCISIONAL BIOPSY Left 04/08/2014   Intraductal papilloma-5 mm  . LUMBAR LAMINECTOMY    . TONSILLECTOMY      Prior to Admission medications   Medication Sig Start Date End Date Taking? Authorizing Provider  albuterol (PROVENTIL HFA;VENTOLIN HFA) 108 (90 Base) MCG/ACT inhaler Inhale 1 puff into the lungs every 6 (six) hours as needed for wheezing or shortness of breath. 09/01/17   Arnetha Courser, MD  allopurinol (ZYLOPRIM) 100 MG tablet Take 2 tablets (200 mg total) by mouth daily. 09/01/17 11/30/17  Arnetha Courser, MD  aspirin EC 81 MG tablet Take 81 mg by mouth daily.    [provider]  azithromycin (ZITHROMAX) 250 MG tablet 1 tab daily by mouth starting 10-31-2017 10/30/17   Delman Kitten, MD  budesonide-formoterol [facility]) 160-4.5 MCG/ACT inhaler Inhale 2 puffs into the lungs 2 (two) times daily. 04/21/17   Arnetha Courser, MD  enalapril (VASOTEC) 10 MG tablet Take 1 tablet (  10 mg total) by mouth daily. 09/01/17   Arnetha Courser, MD  fluticasone (FLONASE) 50 MCG/ACT nasal spray Place 2 sprays into both nostrils daily. 07/01/15   Plonk, Gwyndolyn Saxon, MD  hydrochlorothiazide (MICROZIDE) 12.5 MG capsule Take 1 capsule (12.5 mg total) by mouth daily. 10/11/17   Arnetha Courser, MD  lovastatin (MEVACOR) 40 MG tablet Take 1 tablet (40 mg total) by mouth at bedtime. 09/01/17   Arnetha Courser, MD  meclizine (ANTIVERT) 25 MG tablet TAKE ONE TABLET BY MOUTH THREE TIMES DAILY AS NEEDED  FOR DIZZINESS 09/01/17   Arnetha Courser, MD  predniSONE (DELTASONE) 20 MG tablet Take 2 tablets (40 mg total) by mouth daily with breakfast. 10/30/17   Delman Kitten, MD  simethicone (GAS-X) 80 MG chewable tablet Chew 1 tablet (80 mg total) by mouth 4 (four) times daily as needed for flatulence. 06/04/16 09/01/17  Harvest Dark, MD  triamcinolone cream (KENALOG) 0.1 % APPLY CREAM EXTERNALLY TO AFFECTED AREA TWICE DAILY if needed; not more than one week at a time, then take a break for two weeks 09/01/17   Arnetha Courser, MD    Allergies Patient has no known allergies.  Family History  Problem Relation Age of Onset  . Diabetes Mother   . Lung cancer Father   . Diabetes Sister   . Congestive Heart Failure Sister   . Glaucoma Sister   . Breast cancer Neg Hx     Social History Social History   Tobacco Use  . Smoking status: Former Smoker    Packs/day: 1.50    Years: 20.00    Pack years: 30.00    Types: Cigarettes    Last attempt to quit: 11/13/2008    Years since quitting: 8.9  . Smokeless tobacco: Never Used  . Tobacco comment: smoking cessation materials not required  Substance Use Topics  . Alcohol use: Yes    Alcohol/week: 1.0 standard drinks    Types: 1 Cans of beer per week  . Drug use: No    Review of Systems Constitutional: No fever/chills feeling warm and a little bit fatigued this morning Eyes: No visual changes. ENT: No sore throat.  Thick sputum and slight runny nose Cardiovascular: Denies chest pain. Respiratory: Denies shortness of breath now, but see HPI. Gastrointestinal: No abdominal pain.   Genitourinary: Negative for dysuria. Musculoskeletal: Negative for back pain. Skin: Negative for rash.  No leg swelling. Neurological: Negative for headaches, areas of focal weakness or numbness.    ____________________________________________   PHYSICAL EXAM:  VITAL SIGNS: ED Triage Vitals  Enc Vitals Group     BP 10/30/17 1054 138/86     Pulse Rate  10/30/17 1054 86     Resp 10/30/17 1054 18     Temp 10/30/17 1054 97.6 F (36.4 C)     Temp Source 10/30/17 1054 Oral     SpO2 10/30/17 1054 99 %     Weight --      Height --      Head Circumference --      Peak Flow --      Pain Score 10/30/17 1105 0     Pain Loc --      Pain Edu? --      Excl. in [place]? --     Constitutional: Alert and oriented. Well appearing and in no acute distress.  Very pleasant. Eyes: Conjunctivae are normal. Head: Atraumatic. Nose: Slight clear rhinorrhea. Mouth/Throat: Mucous membranes are moist.  No exudates in oropharynx.  Oropharynx widely patent. Neck: No stridor.  Cardiovascular: Normal rate, regular rhythm. Grossly normal heart sounds.  Good peripheral circulation. Respiratory: Normal respiratory effort.  No retractions. Lungs CTAB suffer some very slight end expiratory wheezing in the lower lobes bilateral.   Gastrointestinal: Soft and nontender. No distention. Musculoskeletal: No lower extremity tenderness nor edema. Neurologic:  Normal speech and language. No gross focal neurologic deficits are appreciated.  Skin:  Skin is warm, dry and intact. No rash noted. Psychiatric: Mood and affect are normal. Speech and behavior are normal.  ____________________________________________   LABS (all labs ordered are listed, but only abnormal results are displayed)  Labs Reviewed  CBC - Abnormal; Notable for the following components:      Result Value   RDW 15.0 (*)    All other components within normal limits  BASIC METABOLIC PANEL - Abnormal; Notable for the following components:   Glucose, Bld 119 (*)    Creatinine, Ser 1.06 (*)    GFR calc non Af Amer 52 (*)    GFR calc Af Amer 60 (*)    All other components within normal limits  INFLUENZA PANEL BY PCR (TYPE A & B)   ____________________________________________  EKG  Reviewed enterotomy 11 AM Heart rate 110 QRS 70 QTc 440 Normal sinus rhythm no evidence of acute  ischemia ____________________________________________  RADIOLOGY  Dg Chest 2 View  Result Date: 10/30/2017 CLINICAL DATA:  Shortness of breath and dizziness for 2 months. EXAM: CHEST - 2 VIEW COMPARISON:  PA and lateral chest 06/04/2016. FINDINGS: The lungs are clear. Heart size is upper normal. Aortic atherosclerosis is noted. No pneumothorax or pleural effusion. No acute bony abnormality. Thoracic spondylosis and degenerative disease about the shoulders noted. IMPRESSION: No acute disease. Atherosclerosis. Electronically Signed   By: Inge Rise M.D.   On: 10/30/2017 11:31    ____________________________________________   PROCEDURES  Procedure(s) performed: None  Procedures  Critical Care performed: No  ____________________________________________   INITIAL IMPRESSION / ASSESSMENT AND PLAN / ED COURSE  Pertinent labs & imaging results that were available during my care of the patient were reviewed by me and considered in my medical decision making (see chart for details).   Patient presents for evaluation of shortness of breath with slight wheezing.  Reports some improvement after using inhaler at home but still having some slight wheezing here and does carry a history of COPD.  Recently treated for bronchitis about 3 weeks ago on unknown antibiotic and reports it went away but now symptoms seem to be returning with symptoms primarily of upper respiratory nature.  No cardiac symptoms.  No pleuritic pain.  No chest pain.  Doubt ACS, doubt pulmonary embolism, chest x-ray reviewed by me is negative for acute disease.  Suspect likely viral illness, given seasonality we will check influenza administer prednisone and nebulizer for what I suspect is early COPD exacerbation with likely upper respiratory infection and recurrent bronchitis.    Clinical Course as of Oct 30 1553  Nancy Fetter Oct 30, 2017  1414 Patient resting comfortably, oxygen saturation 97% on room air currently resting with  a respiratory rate of 15-16.  Reports she feels good without any ongoing shortness of breath or wheezing.   [MQ]    Clinical Course User Index [MQ] Delman Kitten, MD    Return precautions and treatment recommendations and follow-up discussed with the patient who is agreeable with the plan.  Discussed with patient, she has inhalers including albuterol already at home and does not need a  refill for that.  Will give prescription prednisone and azithromycin.  Patient comfortable plan for discharge, feeling improved.  She is well-appearing with normal vital signs.  ____________________________________________   FINAL CLINICAL IMPRESSION(S) / ED DIAGNOSES  Final diagnoses:  COPD exacerbation ([place])  Acute bronchitis, unspecified organism        Note:  This document was prepared using Dragon voice recognition software and may include unintentional dictation errors       Delman Kitten, MD 10/30/17 1555

## 2017-10-30 NOTE — Discharge Instructions (Signed)
We believe that your symptoms are caused today by an exacerbation of your COPD, and possibly bronchitis.  Please take the prescribed medications and any medications that you have at home for your COPD.  Follow up with your doctor as recommended.  If you develop any new or worsening symptoms, including but not limited to fever, persistent vomiting, worsening shortness of breath, or other symptoms that concern you, please return to the Emergency Department immediately. ° °

## 2017-10-30 NOTE — ED Notes (Signed)
First Nurse Note: Patient to ED via [place] from [place], complaining of [place] with phlegm production X 1 month.  Seen previously at Urgent Care and treated with prednisone.

## 2017-11-10 ENCOUNTER — Ambulatory Visit
Admission: RE | Admit: 2017-11-10 | Discharge: 2017-11-10 | Disposition: A | Discharge status: Home / Self Care | Payer: PPO | Source: Ambulatory Visit | Attending: Family Medicine | Admitting: Family Medicine

## 2017-11-10 DIAGNOSIS — Z1239 Encounter for other screening for malignant neoplasm of breast: Secondary | ICD-10-CM

## 2017-11-10 DIAGNOSIS — Z1231 Encounter for screening mammogram for malignant neoplasm of breast: Secondary | ICD-10-CM | POA: Diagnosis not present

## 2018-01-23 ENCOUNTER — Other Ambulatory Visit: Payer: Self-pay | Admitting: Family Medicine

## 2018-01-23 DIAGNOSIS — J449 Chronic obstructive pulmonary disease, unspecified: Secondary | ICD-10-CM

## 2018-02-01 ENCOUNTER — Emergency Department: Payer: PPO

## 2018-02-01 ENCOUNTER — Emergency Department
Admission: EM | Admit: 2018-02-01 | Discharge: 2018-02-01 | Disposition: A | Discharge status: Home / Self Care | Payer: PPO | Attending: Emergency Medicine | Admitting: Emergency Medicine

## 2018-02-01 ENCOUNTER — Ambulatory Visit: Payer: Self-pay

## 2018-02-01 ENCOUNTER — Encounter: Payer: Self-pay | Admitting: Emergency Medicine

## 2018-02-01 DIAGNOSIS — Z7982 Long term (current) use of aspirin: Secondary | ICD-10-CM | POA: Insufficient documentation

## 2018-02-01 DIAGNOSIS — J449 Chronic obstructive pulmonary disease, unspecified: Secondary | ICD-10-CM | POA: Diagnosis not present

## 2018-02-01 DIAGNOSIS — S0083XA Contusion of other part of head, initial encounter: Secondary | ICD-10-CM

## 2018-02-01 DIAGNOSIS — W19XXXA Unspecified fall, initial encounter: Secondary | ICD-10-CM

## 2018-02-01 DIAGNOSIS — Z87891 Personal history of nicotine dependence: Secondary | ICD-10-CM | POA: Insufficient documentation

## 2018-02-01 DIAGNOSIS — Z79899 Other long term (current) drug therapy: Secondary | ICD-10-CM | POA: Insufficient documentation

## 2018-02-01 DIAGNOSIS — Y998 Other external cause status: Secondary | ICD-10-CM | POA: Diagnosis not present

## 2018-02-01 DIAGNOSIS — I129 Hypertensive chronic kidney disease with stage 1 through stage 4 chronic kidney disease, or unspecified chronic kidney disease: Secondary | ICD-10-CM | POA: Diagnosis not present

## 2018-02-01 DIAGNOSIS — W01198A Fall on same level from slipping, tripping and stumbling with subsequent striking against other object, initial encounter: Secondary | ICD-10-CM | POA: Insufficient documentation

## 2018-02-01 DIAGNOSIS — Y92012 Bathroom of single-family (private) house as the place of occurrence of the external cause: Secondary | ICD-10-CM | POA: Insufficient documentation

## 2018-02-01 DIAGNOSIS — N183 Chronic kidney disease, stage 3 (moderate): Secondary | ICD-10-CM | POA: Insufficient documentation

## 2018-02-01 DIAGNOSIS — S199XXA Unspecified injury of neck, initial encounter: Secondary | ICD-10-CM | POA: Diagnosis not present

## 2018-02-01 DIAGNOSIS — Y92009 Unspecified place in unspecified non-institutional (private) residence as the place of occurrence of the external cause: Secondary | ICD-10-CM

## 2018-02-01 DIAGNOSIS — Y9389 Activity, other specified: Secondary | ICD-10-CM | POA: Insufficient documentation

## 2018-02-01 DIAGNOSIS — S0990XA Unspecified injury of head, initial encounter: Secondary | ICD-10-CM | POA: Diagnosis not present

## 2018-02-01 MED ORDER — ACETAMINOPHEN 325 MG PO TABS
650.0000 mg | ORAL_TABLET | Freq: Once | ORAL | Status: AC
  Administered 2018-02-01: 650 mg via ORAL
  Filled 2018-02-01: qty 2

## 2018-02-01 NOTE — Discharge Instructions (Addendum)
Call and make an appointment with your primary care provider.  Continue your regular medication.  Use your walker anytime you are up walking. You may apply ice to your forehead as needed for pain and also for swelling.  The swelling may look worse tomorrow than it does currently.

## 2018-02-01 NOTE — Telephone Encounter (Signed)
Outgoing call to patient who complains of  Falling this am .  Patient states that's she thinks that its related to her blood pressure being to low.  Blood pressure was 120/58 at hospital, and 130/60  Patient states  that she has  Always taken her medication in the morning.  Pat statin that she was going to the bathroom because she thought that she was going to  Urinate on herself.  So she just got up .  Did not sit on the side of the bed an get composure before  getfting   Up.    Patient would also like a Rx. For an     Blood Pressure machine.  Patient inquired if blood Pressure is to low.   Offered to schedule for an appointment.  Patient requested I send not to office.  Has a standing appointment   Feb 11.  Patient is concerned if her medication  need to be adjusted.Patient awaits response from office.                                                                                         Marland Kitchen       Answer Assessment - Initial Assessment Questions 1. BLOOD PRESSURE: "What is the blood pressure?" "Did you take at least two measurements 5 minutes apart?"     *No Answer* 2. ONSET: "When did you take your blood pressure?"     *No Answer* 3. HOW: "How did you obtain the blood pressure?" (e.g., visiting nurse, automatic home BP monitor)    dont have a blood  preusrre machine 4. HISTORY: "Do you have a history of low blood pressure?" "What is your blood pressure normally?"     no 5. MEDICATIONS: "Are you taking any medications for blood pressure?" If yes: "Have they been changed recently?"      Took all medications                                                                                                   6. PULSE RATE: "Do you know what your pulse rate is?"      *No Answer* 7. OTHER SYMPTOMS: "Have you been sick recently?" "Have you had a recent injury?"     no 8. PREGNANCY: "Is there any chance you are pregnant?" "When was your last menstrual period?"     na  Protocols used: LOW BLOOD  PRESSURE-A-AH

## 2018-02-01 NOTE — ED Notes (Signed)
Pt is being discharged to home. Pt has been educated on using the walker utilizing return demonstration. AVS was given explained to the patient and family. They all verbalized understanding.

## 2018-02-01 NOTE — ED Triage Notes (Signed)
Pt reports she has bad knees and has to use a cane or hold on to a wall to walk. Pt states this am she got up to go to the bathroom and leaned in to hold onto the table but missed the table and fell hitting her head. Pt reports no LOC. Pt states that she takes aspirin daily. Pt reports pain to the area when you touch it. Pt denies dizziness, HA, SOB or other sx's related to fall.

## 2018-02-01 NOTE — ED Provider Notes (Signed)
[facility] Emergency Department Provider Note  ___________________________________________   First MD Initiated Contact with Patient 02/01/18 [phone]     (approximate)  I have reviewed the triage vital signs and the nursing notes.   HISTORY  Chief Complaint Fall and Knee Pain  HPI Colleen [name] is a 73 y.o. female presents to the ED with complaint of hitting her head.  Patient states that she has "bad knees" and is supposed to use a cane when walking.  She states that she got up to go to the bathroom and was fine to hold onto a table but missed.  She fell hitting her head on a carpeted floor.  There was no LOC and she called her sister to come sit with her.  Patient reports that she does take an aspirin daily.  She denies any headache, nausea or vomiting.  She denies any visual changes.  She rates her pain as 3/10.   Past Medical History:  Diagnosis Date  . Asthma   . Cataract   . COPD (chronic obstructive pulmonary disease) ([place])   . GERD (gastroesophageal reflux disease)   . Gout   . Hyperlipidemia   . Hypertension   . Osteoarthrosis     Patient Active Problem List   Diagnosis Date Noted  . Medication monitoring encounter 09/01/2017  . Osteopenia 05/18/2017  . Chronic kidney disease, stage III (moderate) ([place]) 04/21/2017  . Morbid obesity ([place]) 04/21/2017  . Other allergic rhinitis 01/10/2015  . Allergic rhinitis, seasonal 07/30/2014  . Airway hyperreactivity 07/30/2014  . HLD (hyperlipidemia) 07/30/2014  . Encounter for immunization 07/30/2014  . Arthritis, degenerative 07/30/2014  . Prediabetes 07/30/2014  . Asthma, mild persistent 07/30/2014  . COPD (chronic obstructive pulmonary disease) ([place]) 07/09/2008  . Benign essential HTN 06/22/2006    Past Surgical History:  Procedure Laterality Date  . BREAST EXCISIONAL BIOPSY Left 04/08/2014   Intraductal papilloma-5 mm  . LUMBAR LAMINECTOMY    . TONSILLECTOMY      Prior to Admission  medications   Medication Sig Start Date End Date Taking? Authorizing Provider  albuterol (PROVENTIL HFA;VENTOLIN HFA) 108 (90 Base) MCG/ACT inhaler Inhale 1 puff into the lungs every 6 (six) hours as needed for wheezing or shortness of breath. 09/01/17   Arnetha Courser, MD  allopurinol (ZYLOPRIM) 100 MG tablet Take 2 tablets (200 mg total) by mouth daily. 09/01/17 11/30/17  Arnetha Courser, MD  aspirin EC 81 MG tablet Take 81 mg by mouth daily.    [provider]  enalapril (VASOTEC) 10 MG tablet Take 1 tablet (10 mg total) by mouth daily. 09/01/17   Arnetha Courser, MD  fluticasone (FLONASE) 50 MCG/ACT nasal spray Place 2 sprays into both nostrils daily. 07/01/15   Plonk, Gwyndolyn Saxon, MD  hydrochlorothiazide (MICROZIDE) 12.5 MG capsule Take 1 capsule (12.5 mg total) by mouth daily. 10/11/17   Arnetha Courser, MD  lovastatin (MEVACOR) 40 MG tablet Take 1 tablet (40 mg total) by mouth at bedtime. 09/01/17   Arnetha Courser, MD  meclizine (ANTIVERT) 25 MG tablet TAKE ONE TABLET BY MOUTH THREE TIMES DAILY AS NEEDED FOR DIZZINESS 09/01/17   Arnetha Courser, MD  simethicone (GAS-X) 80 MG chewable tablet Chew 1 tablet (80 mg total) by mouth 4 (four) times daily as needed for flatulence. 06/04/16 09/01/17  Harvest Dark, MD  SYMBICORT 160-4.5 MCG/ACT inhaler INHALE 2 PUFFS INTO LUNGS TWICE DAILY 01/23/18   Arnetha Courser, MD  triamcinolone cream (KENALOG) 0.1 %  APPLY CREAM EXTERNALLY TO AFFECTED AREA TWICE DAILY if needed; not more than one week at a time, then take a break for two weeks 09/01/17   Arnetha Courser, MD    Allergies Patient has no known allergies.  Family History  Problem Relation Age of Onset  . Diabetes Mother   . Lung cancer Father   . Diabetes Sister   . Congestive Heart Failure Sister   . Glaucoma Sister   . Breast cancer Neg Hx     Social History Social History   Tobacco Use  . Smoking status: Former Smoker    Packs/day: 1.50    Years: 20.00    Pack years: 30.00    Types:  Cigarettes    Last attempt to quit: 11/13/2008    Years since quitting: 9.2  . Smokeless tobacco: Never Used  . Tobacco comment: smoking cessation materials not required  Substance Use Topics  . Alcohol use: Yes    Alcohol/week: 1.0 standard drinks    Types: 1 Cans of beer per week  . Drug use: No    Review of Systems Constitutional: No fever/chills Eyes: No visual changes. ENT: No trauma. Cardiovascular: Denies chest pain. Respiratory: Denies shortness of breath. Gastrointestinal: No abdominal pain.  No nausea, no vomiting.  Musculoskeletal: Positive chronic bilateral knee pain. Skin: Contusion right forehead. Neurological: Negative for headaches, focal weakness or numbness. ____________________________________________   PHYSICAL EXAM:  VITAL SIGNS: ED Triage Vitals  Enc Vitals Group     BP 02/01/18 0812 125/60     Pulse Rate 02/01/18 0812 78     Resp 02/01/18 0812 19     Temp 02/01/18 0812 97.8 F (36.6 C)     Temp Source 02/01/18 0812 Oral     SpO2 02/01/18 0812 100 %     Weight 02/01/18 0813 284 lb (128.8 kg)     Height 02/01/18 0813 5\' 7"  (1.702 m)     Head Circumference --      Peak Flow --      Pain Score 02/01/18 0813 3     Pain Loc --      Pain Edu? --      Excl. in [place]? --    Constitutional: Alert and oriented. Well appearing and in no acute distress.  Talkative, interactive, answers questions appropriately.  Patient is markedly obese. Eyes: Conjunctivae are normal. PERRL. EOMI. Head: Right side forehead has minimal soft tissue edema but is point tender.  Skin is intact. Nose: No trauma. Neck: No stridor.  No cervical tenderness on palpation posteriorly. Cardiovascular: Normal rate, regular rhythm. Grossly normal heart sounds.  Good peripheral circulation. Respiratory: Normal respiratory effort.  No retractions. Lungs CTAB. Gastrointestinal: Soft and nontender. No distention.  Musculoskeletal: On examination of the lower extremities there is no point  tenderness however patient has markedly degenerative knees bilaterally.  Patient is able to move her lower extremities.  It is intact and there is no pitting edema noted lower extremities. Neurologic:  Normal speech and language. No gross focal neurologic deficits are appreciated.  Cranial nerves II through XII grossly intact. Skin:  Skin is warm, dry and intact. No rash noted. Psychiatric: Mood and affect are normal. Speech and behavior are normal.  ____________________________________________   LABS (all labs ordered are listed, but only abnormal results are displayed)  Labs Reviewed - No data to display  RADIOLOGY  Official radiology report(s): Ct Head Wo Contrast  Result Date: 02/01/2018 CLINICAL DATA:  Head trauma and ataxia EXAM: CT  HEAD WITHOUT CONTRAST CT CERVICAL SPINE WITHOUT CONTRAST TECHNIQUE: Multidetector CT imaging of the head and cervical spine was performed following the standard protocol without intravenous contrast. Multiplanar CT image reconstructions of the cervical spine were also generated. COMPARISON:  12/31/2014 head CT FINDINGS: CT HEAD FINDINGS Brain: No evidence of acute infarction, hemorrhage, hydrocephalus, extra-axial collection or mass lesion/mass effect. Vascular: No hyperdense vessel or unexpected calcification. Skull: Normal. Negative for fracture or focal lesion. Sinuses/Orbits: No acute finding. CT CERVICAL SPINE FINDINGS Alignment: No traumatic malalignment. Degenerative C3-4 anterolisthesis. Skull base and vertebrae: No acute fracture Soft tissues and spinal canal: No prevertebral fluid or swelling. No visible canal hematoma. Disc levels: Congenitally narrow spinal canal exacerbated by degenerative endplate ridging at K2-4 and C6-7. Upper cervical facet arthropathy with C3-4 anterolisthesis. Upper chest: No acute finding IMPRESSION: No evidence of acute intracranial or cervical spine injury. Electronically Signed   By: Monte Fantasia M.D.   On: 02/01/2018 08:57    Ct Cervical Spine Wo Contrast  Result Date: 02/01/2018 CLINICAL DATA:  Head trauma and ataxia EXAM: CT HEAD WITHOUT CONTRAST CT CERVICAL SPINE WITHOUT CONTRAST TECHNIQUE: Multidetector CT imaging of the head and cervical spine was performed following the standard protocol without intravenous contrast. Multiplanar CT image reconstructions of the cervical spine were also generated. COMPARISON:  12/31/2014 head CT FINDINGS: CT HEAD FINDINGS Brain: No evidence of acute infarction, hemorrhage, hydrocephalus, extra-axial collection or mass lesion/mass effect. Vascular: No hyperdense vessel or unexpected calcification. Skull: Normal. Negative for fracture or focal lesion. Sinuses/Orbits: No acute finding. CT CERVICAL SPINE FINDINGS Alignment: No traumatic malalignment. Degenerative C3-4 anterolisthesis. Skull base and vertebrae: No acute fracture Soft tissues and spinal canal: No prevertebral fluid or swelling. No visible canal hematoma. Disc levels: Congenitally narrow spinal canal exacerbated by degenerative endplate ridging at O9-7 and C6-7. Upper cervical facet arthropathy with C3-4 anterolisthesis. Upper chest: No acute finding IMPRESSION: No evidence of acute intracranial or cervical spine injury. Electronically Signed   By: Monte Fantasia M.D.   On: 02/01/2018 08:57   ____________________________________________   PROCEDURES  Procedure(s) performed: None  Procedures  Critical Care performed: No  ____________________________________________   INITIAL IMPRESSION / ASSESSMENT AND PLAN / ED COURSE  As part of my medical decision making, I reviewed the following data within the electronic MEDICAL RECORD NUMBER Notes from prior ED visits and [place] Controlled Substance Database  Patient presents to the ED after falling while going to the bathroom.  She states that she has "bad knees" and is been told by orthopedics that her left knee needs to be replaced.  She generally walks with a cane but did not use  that this morning.  She fell on carpeted floor and denies any loss of consciousness.  She called her sister immediately to come over to her house.  She denies any headache but states that she did hit her head and because she is on aspirin is anxious about a head injury.  CT scan was reassuring.  Physical exam was reassuring as cranial nerves were intact.  Patient was given a walker while in the ED.  Family will contact her PCP to get a prescription for a 3 pronged walker that she prefers to get.  She is encouraged to use her walker since her knees are in such bad condition.  She also is encouraged to follow-up with [facility] orthopedics since it is been 2 years since she has seen them.  ____________________________________________   FINAL CLINICAL IMPRESSION(S) / ED DIAGNOSES  Final  diagnoses:  Contusion of face, initial encounter  Fall in home, initial encounter     ED Discharge Orders    None       Note:  This document was prepared using Dragon voice recognition software and may include unintentional dictation errors.    Johnn Hai, PA-C 02/01/18 1356    Earleen Newport, MD 02/01/18 [phone]

## 2018-02-02 NOTE — Telephone Encounter (Signed)
She needs an appointment ASAP Have her STOP her HCTZ for now

## 2018-02-02 NOTE — Telephone Encounter (Signed)
Patient notified and appt scheduled.

## 2018-02-03 ENCOUNTER — Encounter: Payer: Self-pay | Admitting: Family Medicine

## 2018-02-03 ENCOUNTER — Ambulatory Visit (INDEPENDENT_AMBULATORY_CARE_PROVIDER_SITE_OTHER): Payer: PPO | Admitting: Family Medicine

## 2018-02-03 VITALS — BP 118/64 | HR 97 | Temp 97.4°F | Ht 65.0 in | Wt 281.1 lb

## 2018-02-03 DIAGNOSIS — W06XXXS Fall from bed, sequela: Secondary | ICD-10-CM | POA: Diagnosis not present

## 2018-02-03 DIAGNOSIS — L309 Dermatitis, unspecified: Secondary | ICD-10-CM

## 2018-02-03 DIAGNOSIS — I10 Essential (primary) hypertension: Secondary | ICD-10-CM

## 2018-02-03 MED ORDER — BLOOD PRESSURE KIT DEVI: 1.0000 | Freq: Every day | 0 refills | Status: DC

## 2018-02-03 NOTE — Patient Instructions (Addendum)
Stay off of the HCTZ Check your blood pressure once a day and if you feel light-headed or dizzy When you get up out of bed, take your time and hold on for a moment before starting to walk If you blood pressure is staying less than 120 on top, reduce your enalapril even further to 5 mg daily Keep me posted  Check out the information at familydoctor.org entitled "Nutrition for Weight Loss: What You Need to Know about Fad Diets" Try to lose between 1-2 pounds per week by taking in fewer calories and burning off more calories You can succeed by limiting portions, limiting foods dense in calories and fat, becoming more active, and drinking 8 glasses of water a day (64 ounces) Don't skip meals, especially breakfast, as skipping meals may alter your metabolism Do not use over-the-counter weight loss pills or gimmicks that claim rapid weight loss A healthy BMI (or body mass index) is between 18.5 and 24.9 You can calculate your ideal BMI at the [employer] website ClubMonetize.fr  Obesity, Adult Obesity is the condition of having too much total body fat. Being overweight or obese means that your weight is greater than what is considered healthy for your body size. Obesity is determined by a measurement called BMI. BMI is an estimate of body fat and is calculated from height and weight. For adults, a BMI of 30 or higher is considered obese. Obesity can eventually lead to other health concerns and major illnesses, including:  Stroke.  Coronary artery disease (CAD).  Type 2 diabetes.  Some types of cancer, including cancers of the colon, breast, uterus, and gallbladder.  Osteoarthritis.  High blood pressure (hypertension).  High cholesterol.  Sleep apnea.  Gallbladder stones.  Infertility problems. What are the causes? The main cause of obesity is taking in (consuming) more calories than your body uses for energy. Other factors that contribute to  this condition may include:  Being born with genes that make you more likely to become obese.  Having a medical condition that causes obesity. These conditions include: ? Hypothyroidism. ? Polycystic ovarian syndrome (PCOS). ? Binge-eating disorder. ? Cushing syndrome.  Taking certain medicines, such as steroids, antidepressants, and seizure medicines.  Not being physically active (sedentary lifestyle).  Living where there are limited places to exercise safely or buy healthy foods.  Not getting enough sleep. What increases the risk? The following factors may increase your risk of this condition:  Having a family history of obesity.  Being a woman of African-American descent.  Being a man of Hispanic descent. What are the signs or symptoms? Having excessive body fat is the main symptom of this condition. How is this diagnosed? This condition may be diagnosed based on:  Your symptoms.  Your medical history.  A physical exam. Your health care provider may measure: ? Your BMI. If you are an adult with a BMI between 25 and less than 30, you are considered overweight. If you are an adult with a BMI of 30 or higher, you are considered obese. ? The distances around your hips and your waist (circumferences). These may be compared to each other to help diagnose your condition. ? Your skinfold thickness. Your health care provider may gently pinch a fold of your skin and measure it. How is this treated? Treatment for this condition often includes changing your lifestyle. Treatment may include some or all of the following:  Dietary changes. Work with your health care provider and a dietitian to set a weight-loss goal that  is healthy and reasonable for you. Dietary changes may include eating: ? Smaller portions. A portion size is the amount of a particular food that is healthy for you to eat at one time. This varies from person to person. ? Low-calorie or low-fat options. ? More whole  grains, fruits, and vegetables.  Regular physical activity. This may include aerobic activity (cardio) and strength training.  Medicine to help you lose weight. Your health care provider may prescribe medicine if you are unable to lose 1 pound a week after 6 weeks of eating more healthily and doing more physical activity.  Surgery. Surgical options may include gastric banding and gastric bypass. Surgery may be done if: ? Other treatments have not helped to improve your condition. ? You have a BMI of 40 or higher. ? You have life-threatening health problems related to obesity. Follow these instructions at home:  Eating and drinking   Follow recommendations from your health care provider about what you eat and drink. Your health care provider may advise you to: ? Limit fast foods, sweets, and processed snack foods. ? Choose low-fat options, such as low-fat milk instead of whole milk. ? Eat 5 or more servings of fruits or vegetables every day. ? Eat at home more often. This gives you more control over what you eat. ? Choose healthy foods when you eat out. ? Learn what a healthy portion size is. ? Keep low-fat snacks on hand. ? Avoid sugary drinks, such as soda, fruit juice, iced tea sweetened with sugar, and flavored milk. ? Eat a healthy breakfast.  Drink enough water to keep your urine clear or pale yellow.  Do not go without eating for long periods of time (do not fast) or follow a fad diet. Fasting and fad diets can be unhealthy and even dangerous. Physical Activity  Exercise regularly, as told by your health care provider. Ask your health care provider what types of exercise are safe for you and how often you should exercise.  Warm up and stretch before being active.  Cool down and stretch after being active.  Rest between periods of activity. Lifestyle  Limit the time that you spend in front of your TV, computer, or video game system.  Find ways to reward yourself that do  not involve food.  Limit alcohol intake to no more than 1 drink a day for nonpregnant women and 2 drinks a day for men. One drink equals 12 oz of beer, 5 oz of wine, or 1 oz of hard liquor. General instructions  Keep a weight loss journal to keep track of the food you eat and how much you exercise you get.  Take over-the-counter and prescription medicines only as told by your health care provider.  Take vitamins and supplements only as told by your health care provider.  Consider joining a support group. Your health care provider may be able to recommend a support group.  Keep all follow-up visits as told by your health care provider. This is important. Contact a health care provider if:  You are unable to meet your weight loss goal after 6 weeks of dietary and lifestyle changes. This information is not intended to replace advice given to you by your health care provider. Make sure you discuss any questions you have with your health care provider. Document Released: 02/19/2004 Document Revised: 06/16/2015 Document Reviewed: 10/30/2014 Elsevier Interactive Patient Education  2019 Elsevier Inc.  Preventing Unhealthy [employer], Adult Staying at a healthy weight is important to  your overall health. When fat builds up in your body, you may become overweight or obese. Being overweight or obese increases your risk of developing certain health problems, such as heart disease, diabetes, sleeping problems, joint problems, and some types of cancer. Unhealthy weight gain is often the result of making unhealthy food choices or not getting enough exercise. You can make changes to your lifestyle to prevent obesity and stay as healthy as possible. What nutrition changes can be made?   Eat only as much as your body needs. To do this: ? Pay attention to signs that you are hungry or full. Stop eating as soon as you feel full. ? If you feel hungry, try drinking water first before eating. Drink enough  water so your urine is clear or pale yellow. ? Eat smaller portions. Pay attention to portion sizes when eating out. ? Look at serving sizes on food labels. Most foods contain more than one serving per container. ? Eat the recommended number of calories for your gender and activity level. For most active people, a daily total of 2,000 calories is appropriate. If you are trying to lose weight or are not very active, you may need to eat fewer calories. Talk with your health care provider or a diet and nutrition specialist (dietitian) about how many calories you need each day.  Choose healthy foods, such as: ? Fruits and vegetables. At each meal, try to fill at least half of your plate with fruits and vegetables. ? Whole grains, such as whole-wheat bread, brown rice, and quinoa. ? Lean meats, such as chicken or fish. ? Other healthy proteins, such as beans, eggs, or tofu. ? Healthy fats, such as nuts, seeds, fatty fish, and olive oil. ? Low-fat or fat-free dairy products.  Check food labels, and avoid food and drinks that: ? Are high in calories. ? Have added sugar. ? Are high in sodium. ? Have saturated fats or trans fats.  Cook foods in healthier ways, such as by baking, broiling, or grilling.  Make a meal plan for the week, and shop with a grocery list to help you stay on track with your purchases. Try to avoid going to the grocery store when you are hungry.  When grocery shopping, try to shop around the outside of the store first, where the fresh foods are. Doing this helps you to avoid prepackaged foods, which can be high in sugar, salt (sodium), and fat. What lifestyle changes can be made?   Exercise for 30 or more minutes on 5 or more days each week. Exercising may include brisk walking, yard work, biking, running, swimming, and team sports like basketball and soccer. Ask your health care provider which exercises are safe for you.  Do muscle-strengthening activities, such as lifting  weights or using resistance bands, on 2 or more days a week.  Do not use any products that contain nicotine or tobacco, such as cigarettes and e-cigarettes. If you need help quitting, ask your health care provider.  Limit alcohol intake to no more than 1 drink a day for nonpregnant women and 2 drinks a day for men. One drink equals 12 oz of beer, 5 oz of wine, or 1 oz of hard liquor.  Try to get 7-9 hours of sleep each night. What other changes can be made?  Keep a food and activity journal to keep track of: ? What you ate and how many calories you had. Remember to count the calories in sauces, dressings, and side  dishes. ? Whether you were active, and what exercises you did. ? Your calorie, weight, and activity goals.  Check your weight regularly. Track any changes. If you notice you have gained weight, make changes to your diet or activity routine.  Avoid taking weight-loss medicines or supplements. Talk to your health care provider before starting any new medicine or supplement.  Talk to your health care provider before trying any new diet or exercise plan. Why are these changes important? Eating healthy, staying active, and having healthy habits can help you to prevent obesity. Those changes also:  Help you manage stress and emotions.  Help you connect with friends and family.  Improve your self-esteem.  Improve your sleep.  Prevent long-term health problems. What can happen if changes are not made? Being obese or overweight can cause you to develop joint or bone problems, which can make it hard for you to stay active or do activities you enjoy. Being obese or overweight also puts stress on your heart and lungs and can lead to health problems like diabetes, heart disease, and some cancers. Where to find more information Talk with your health care provider or a dietitian about healthy eating and healthy lifestyle choices. You may also find information from:  [place] Department of  Agriculture, MyPlate: FormerBoss.no  American Heart Association: www.heart.org  Centers for Disease Control and Prevention: http://www.wolf.info/ Summary  Staying at a healthy weight is important to your overall health. It helps you to prevent certain diseases and health problems, such as heart disease, diabetes, joint problems, sleep disorders, and some types of cancer.  Being obese or overweight can cause you to develop joint or bone problems, which can make it hard for you to stay active or do activities you enjoy.  You can prevent unhealthy weight gain by eating a healthy diet, exercising regularly, not smoking, limiting alcohol, and getting enough sleep.  Talk with your health care provider or a dietitian for guidance about healthy eating and healthy lifestyle choices. This information is not intended to replace advice given to you by your health care provider. Make sure you discuss any questions you have with your health care provider. Document Released: 01/13/2016 Document Revised: 10/22/2016 Document Reviewed: 02/18/2016 Elsevier Interactive Patient Education  2019 Elsevier Inc.  Try oat milk or soy milk or almond milk instead of cow's milk  Ask the pharmacist to get your some nice mild compression stockings

## 2018-02-03 NOTE — Assessment & Plan Note (Signed)
Encouragement given; limit portions, avoid fried foods, limit saturated fats; try other milk like oat or almond or soy

## 2018-02-03 NOTE — Progress Notes (Addendum)
BP 118/64   Pulse 97   Temp (!) 97.4 F (36.3 C)   Ht _0  (1.651 m)   Wt 281 lb 1.6 oz (127.5 kg)   SpO2 99%   BMI 46.78 kg/m    Subjective:    Patient ID: Colleen [name], female    DOB: 03/24/1945, 73 y.o.   MRN: [id number]  HPI: Colleen [name] is a 73 y.o. female  Chief Complaint  Patient presents with  . Follow-up    ER follow up/Fall    HPI Patient is here for f/u after a fall She had to go to the bathoom; she reached for the dresser to turn, felt like she was in a daze, knew she was going to fall, did not hit face down because she was in the act of turning, so hit the right side of the forehead; no LOC; her head hit hard though; she was at the foot of the bed, crawled to where her nightstand was and used that and the bed to get up; went to the den and then unlocked the door, then sat back on the bed and called her sister; sister came and took her to the hospital; knot on the head; went to the ER; did a CT scan of the head; they gave her 500 mg of tylenol there; she does not have any headache now; had already HCTZ yesterday, but has stopped it now; no way to check BP at home; ambulatory with walker; I asked if PT would be helpful for strength or balance and she said no; she needs surgery on her knees, has to lose weight first She is bothered by rash on the face and not getting better; bumps and itches  CT HEAD WITHOUT CONTRAST  CT CERVICAL SPINE WITHOUT CONTRAST  TECHNIQUE: Multidetector CT imaging of the head and cervical spine was performed following the standard protocol without intravenous contrast. Multiplanar CT image reconstructions of the cervical spine were also generated.  COMPARISON:  12/31/2014 head CT  FINDINGS: CT HEAD FINDINGS  Brain: No evidence of acute infarction, hemorrhage, hydrocephalus, extra-axial collection or mass lesion/mass effect.  Vascular: No hyperdense vessel or unexpected calcification.  Skull: Normal. Negative for  fracture or focal lesion.  Sinuses/Orbits: No acute finding.  CT CERVICAL SPINE FINDINGS  Alignment: No traumatic malalignment. Degenerative C3-4 anterolisthesis.  Skull base and vertebrae: No acute fracture  Soft tissues and spinal canal: No prevertebral fluid or swelling. No visible canal hematoma.  Disc levels: Congenitally narrow spinal canal exacerbated by degenerative endplate ridging at V3-7 and C6-7. Upper cervical facet arthropathy with C3-4 anterolisthesis.  Upper chest: No acute finding  IMPRESSION: No evidence of acute intracranial or cervical spine injury.   Electronically Signed   By: Monte Fantasia M.D.   On: 02/01/2018 08:57   Depression screen [facility] 2/9 02/03/2018 09/01/2017 09/01/2017 05/31/2017 04/21/2017  Decreased Interest 0 0 0 0 0  Down, Depressed, Hopeless 0 0 0 0 0  PHQ - 2 Score 0 0 0 0 0  Altered sleeping 0 0 - - -  Tired, decreased energy 0 0 - - -  Change in appetite 0 0 - - -  Feeling bad or failure about yourself  0 0 - - -  Trouble concentrating 0 0 - - -  Moving slowly or fidgety/restless 0 0 - - -  Suicidal thoughts 0 0 - - -  PHQ-9 Score 0 0 - - -  Difficult doing work/chores Not difficult at all Not  difficult at all - - -   Fall Risk  02/03/2018 09/01/2017 09/01/2017 05/31/2017 04/21/2017  Falls in the past year? 1 No No No No  Number falls in past yr: 0 - - - -  Injury with Fall? 0 - - - -  Risk for fall due to : - Impaired vision;Impaired balance/gait;History of fall(s) - - -  Risk for fall due to: Comment - wears eyeglasses; avoids stairs, ambulates with walker; knee pain - - -    Relevant past medical, surgical, family and social history reviewed Past Medical History:  Diagnosis Date  . Asthma   . Cataract   . COPD (chronic obstructive pulmonary disease) ([place])   . GERD (gastroesophageal reflux disease)   . Gout   . Hyperlipidemia   . Hypertension   . Osteoarthrosis    Past Surgical History:  Procedure Laterality Date  .  BREAST EXCISIONAL BIOPSY Left 04/08/2014   Intraductal papilloma-5 mm  . LUMBAR LAMINECTOMY    . TONSILLECTOMY     Family History  Problem Relation Age of Onset  . Diabetes Mother   . Lung cancer Father   . Diabetes Sister   . Congestive Heart Failure Sister   . Glaucoma Sister   . Breast cancer Neg Hx    Social History   Tobacco Use  . Smoking status: Former Smoker    Packs/day: 1.50    Years: 20.00    Pack years: 30.00    Types: Cigarettes    Last attempt to quit: 11/13/2008    Years since quitting: 9.2  . Smokeless tobacco: Never Used  . Tobacco comment: smoking cessation materials not required  Substance Use Topics  . Alcohol use: Yes    Alcohol/week: 1.0 standard drinks    Types: 1 Cans of beer per week  . Drug use: No     [facility] Visit [facility]/03/[facility] in [facility]  AUDIT-C Score  1      Interim medical history since last visit reviewed. Allergies and medications reviewed  Review of Systems Per HPI unless specifically indicated above     Objective:    BP 118/64   Pulse 97   Temp (!) 97.4 F (36.3 C)   Ht _0  (1.651 m)   Wt 281 lb 1.6 oz (127.5 kg)   SpO2 99%   BMI 46.78 kg/m   Wt Readings from Last 3 Encounters:  02/03/18 281 lb 1.6 oz (127.5 kg)  02/01/18 284 lb (128.8 kg)  09/01/17 284 lb (128.8 kg)    Physical Exam Constitutional:      Appearance: She is well-developed. She is obese.  HENT:     Head: Normocephalic.     Comments: Mild bruise on right side forehead Eyes:     General: No scleral icterus. Cardiovascular:     Rate and Rhythm: Normal rate and regular rhythm.  Pulmonary:     Effort: Pulmonary effort is normal.     Breath sounds: Normal breath sounds.  Musculoskeletal:     Right lower leg: No edema.     Left lower leg: No edema.  Neurological:     Mental Status: She is alert.  Psychiatric:        Behavior: Behavior normal.     Results for orders placed or performed during the hospital encounter  of 10/30/17  CBC  Result Value Ref Range   WBC 7.3 3.6 - 11.0 K/uL   RBC 4.34 3.80 - 5.20 MIL/uL   Hemoglobin 12.7  12.0 - 16.0 g/dL   HCT 39.0 35.0 - 47.0 %   MCV 89.9 80.0 - 100.0 fL   MCH 29.2 26.0 - 34.0 pg   MCHC 32.5 32.0 - 36.0 g/dL   RDW 15.0 (H) 11.5 - 14.5 %   Platelets 294 150 - 440 K/uL  Basic metabolic panel  Result Value Ref Range   Sodium 138 135 - 145 mmol/L   Potassium 4.0 3.5 - 5.1 mmol/L   Chloride 103 98 - 111 mmol/L   CO2 28 22 - 32 mmol/L   Glucose, Bld 119 (H) 70 - 99 mg/dL   BUN 20 8 - 23 mg/dL   Creatinine, Ser 1.06 (H) 0.44 - 1.00 mg/dL   Calcium 9.3 8.9 - 10.3 mg/dL   GFR calc non Af Amer 52 (L) >60 mL/min   GFR calc Af Amer 60 (L) >60 mL/min   Anion gap 7 5 - 15  Influenza panel by PCR (type A & B)  Result Value Ref Range   Influenza A By PCR NEGATIVE NEGATIVE   Influenza B By PCR NEGATIVE NEGATIVE      Assessment & Plan:   Problem List Items Addressed This Visit      Cardiovascular and Mediastinum   Benign essential HTN    Stopped the HCTZ; monitor BP daily; would prefer her systolic runs 758-832 range, not any lower; will decrease ACE-I if low        Other   Morbid obesity (HCC)    Encouragement given; limit portions, avoid fried foods, limit saturated fats; try other milk like oat or almond or soy       Other Visit Diagnoses    Fall from bed, sequela    -  Primary   with ER evaluation; reviewed CT scan of neck and head; improving; may have been 2/2 low BP; stopped HCTZ, monitor BP daily   Dermatitis       refer to local derm   Relevant Orders   Ambulatory referral to Dermatology       Follow up plan: No follow-ups on file.  An after-visit summary was printed and given to the patient at [place].  Please see the patient instructions which may contain other information and recommendations beyond what is mentioned above in the assessment and plan.  Meds ordered this encounter  Medications  . Blood Pressure Monitoring (BLOOD  PRESSURE KIT) DEVI    Sig: 1 Device by Does not apply route daily. Fluctuating blood pressure; please get appropriate size, large cuff    Dispense:  1 Device    Refill:  0    I10    Orders Placed This Encounter  Procedures  . Ambulatory referral to Dermatology

## 2018-02-03 NOTE — Addendum Note (Signed)
Addended by: Shalev Helminiak, Satira Anis on: 02/03/2018 08:39 AM   Modules accepted: Orders

## 2018-02-03 NOTE — Assessment & Plan Note (Signed)
Stopped the HCTZ; monitor BP daily; would prefer her systolic runs 532-023 range, not any lower; will decrease ACE-I if low

## 2018-02-07 ENCOUNTER — Encounter: Payer: Self-pay | Admitting: Nurse Practitioner

## 2018-02-07 ENCOUNTER — Ambulatory Visit (INDEPENDENT_AMBULATORY_CARE_PROVIDER_SITE_OTHER): Payer: PPO | Admitting: Nurse Practitioner

## 2018-02-07 ENCOUNTER — Ambulatory Visit: Payer: Self-pay | Admitting: *Deleted

## 2018-02-07 VITALS — BP 157/84 | HR 88 | Temp 97.5°F | Resp 16 | Ht 65.0 in | Wt 284.3 lb

## 2018-02-07 DIAGNOSIS — I10 Essential (primary) hypertension: Secondary | ICD-10-CM

## 2018-02-07 MED ORDER — HYDROCHLOROTHIAZIDE 12.5 MG PO TABS: 6.2500 mg | ORAL_TABLET | Freq: Every day | ORAL | 0 refills | Status: DC

## 2018-02-07 NOTE — Progress Notes (Signed)
Name: Colleen [name]   MRN: [id number]    DOB: July 31, 1945   Date:02/07/2018       Progress Note  Subjective  Chief Complaint  Chief Complaint  Patient presents with  . Hypertension    patient stated that her BP was 152/118 so she took 2 enalapril (total 41m)    HPI  Patient was taken off of her HCTZ 12.550mdaily on 1/10 due to lower blood pressure readings- she was asymptomatic at the time. She has noted since coming off she has had elevated reading and increased swelling in her hands. Today her blood pressure was 161/89 so she took 2066mf enalapril instead of her normal 27m49mse and rechecked her blood pressure and it was 152/118. States when her blood pressure is up gets mild headache.    Patient brought in her home cuff which read 157/84 compared to our reading of 132/76.   Patient Active Problem List   Diagnosis Date Noted  . Medication monitoring encounter 09/01/2017  . Osteopenia 05/18/2017  . Chronic kidney disease, stage III (moderate) (HCC)[place]/28/2019  . Morbid obesity (HCC)[place]/28/2019  . Other allergic rhinitis 01/10/2015  . Allergic rhinitis, seasonal 07/30/2014  . Airway hyperreactivity 07/30/2014  . HLD (hyperlipidemia) 07/30/2014  . Encounter for immunization 07/30/2014  . Arthritis, degenerative 07/30/2014  . Prediabetes 07/30/2014  . Asthma, mild persistent 07/30/2014  . COPD (chronic obstructive pulmonary disease) (HCC)[place]/15/2010  . Benign essential HTN 06/22/2006    Past Medical History:  Diagnosis Date  . Asthma   . Cataract   . COPD (chronic obstructive pulmonary disease) (HCC)[place]. GERD (gastroesophageal reflux disease)   . Gout   . Hyperlipidemia   . Hypertension   . Osteoarthrosis     Past Surgical History:  Procedure Laterality Date  . BREAST EXCISIONAL BIOPSY Left 04/08/2014   Intraductal papilloma-5 mm  . LUMBAR LAMINECTOMY    . TONSILLECTOMY      Social History   Tobacco Use  . Smoking status: Former Smoker    Packs/day: 1.50     Years: 20.00    Pack years: 30.00    Types: Cigarettes    Last attempt to quit: 11/13/2008    Years since quitting: 9.2  . Smokeless tobacco: Never Used  . Tobacco comment: smoking cessation materials not required  Substance Use Topics  . Alcohol use: Yes    Alcohol/week: 1.0 standard drinks    Types: 1 Cans of beer per week     Current Outpatient Medications:  .  albuterol (PROVENTIL HFA;VENTOLIN HFA) 108 (90 Base) MCG/ACT inhaler, Inhale 1 puff into the lungs every 6 (six) hours as needed for wheezing or shortness of breath., Disp: 1 Inhaler, Rfl: 5 .  aspirin EC 81 MG tablet, Take 81 mg by mouth daily., Disp: , Rfl:  .  Blood Pressure Monitoring (BLOOD PRESSURE KIT) DEVI, 1 Device by Does not apply route daily. Fluctuating blood pressure; please get appropriate size, large cuff, Disp: 1 Device, Rfl: 0 .  enalapril (VASOTEC) 10 MG tablet, Take 1 tablet (10 mg total) by mouth daily., Disp: 90 tablet, Rfl: 1 .  lovastatin (MEVACOR) 40 MG tablet, Take 1 tablet (40 mg total) by mouth at bedtime., Disp: 90 tablet, Rfl: 1 .  SYMBICORT 160-4.5 MCG/ACT inhaler, INHALE 2 PUFFS INTO LUNGS TWICE DAILY, Disp: 3 Inhaler, Rfl: 1 .  allopurinol (ZYLOPRIM) 100 MG tablet, Take 2 tablets (200 mg total) by mouth daily., Disp: 180 tablet, Rfl: 1 .  fluticasone (FLONASE) 50 MCG/ACT nasal spray, Place 2 sprays into both nostrils daily. (Patient not taking: Reported on 02/03/2018), Disp: 16 g, Rfl: 2 .  meclizine (ANTIVERT) 25 MG tablet, TAKE ONE TABLET BY MOUTH THREE TIMES DAILY AS NEEDED FOR DIZZINESS (Patient not taking: Reported on 02/07/2018), Disp: 30 tablet, Rfl: 0 .  simethicone (GAS-X) 80 MG chewable tablet, Chew 1 tablet (80 mg total) by mouth 4 (four) times daily as needed for flatulence., Disp: 100 tablet, Rfl: 2 .  triamcinolone cream (KENALOG) 0.1 %, APPLY CREAM EXTERNALLY TO AFFECTED AREA TWICE DAILY if needed; not more than one week at a time, then take a break for two weeks (Patient not  taking: Reported on 02/07/2018), Disp: 45 g, Rfl: 0  No Known Allergies  ROS   No other specific complaints in a complete review of systems (except as listed in HPI above).  Objective  Vitals:   02/07/18 1035 02/07/18 1044 02/07/18 1045  BP: 140/62 132/76 (!) 157/84  Pulse: 88    Resp: 16    Temp: (!) 97.5 F (36.4 C)    TempSrc: Oral    SpO2: 97%    Weight: 284 lb 4.8 oz (129 kg)    Height: _0  (1.651 m)       Body mass index is 47.31 kg/m.  Nursing Note and Vital Signs reviewed.  Physical Exam Constitutional:      Appearance: Normal appearance.  HENT:     Head: Normocephalic and atraumatic.     Nose: No congestion.  Eyes:     Conjunctiva/sclera: Conjunctivae normal.  Cardiovascular:     Rate and Rhythm: Normal rate and regular rhythm.     Comments: Mild non-pitting edema in bilateral lower extremities Pulmonary:     Effort: Pulmonary effort is normal.     Breath sounds: Normal breath sounds.  Skin:    General: Skin is warm and dry.     Findings: No erythema.  Neurological:     General: No focal deficit present.     Mental Status: She is alert and oriented to person, place, and time.      No results found for this or any previous visit (from the past 48 hour(s)).  Assessment & Plan  1. Essential hypertension - Please take enalapril 70m daily (full tablet) and HCTZ 6.246mdaily (half a tablet). - for leg swelling, can wear compression stockings during the day time and take them off at night. When you are resting elevate you legs on some pillows to let the swelling go down. - hydrochlorothiazide (HYDRODIURIL) 12.5 MG tablet; Take 0.5 tablets (6.25 mg total) by mouth daily.  Dispense: 15 tablet; Refill: 0

## 2018-02-07 NOTE — Patient Instructions (Addendum)
-   Please take enalapril 10mg  daily (full tablet) and HCTZ 6.25mg  daily (half a tablet). - for leg swelling, can wear compression stockings during the day time and take them off at night. When you are resting elevate you legs on some pillows to let the swelling go down.   Your goal blood pressure is less than 140 mmHg on top. Try to follow the DASH guidelines (DASH stands for Dietary Approaches to Stop Hypertension) Try to limit the sodium in your diet.  Ideally, consume less than 1.5 grams (less than 1,500mg ) per day. Do not add salt when cooking or at the table.  Check the sodium amount on labels when shopping, and choose items lower in sodium when given a choice. Avoid or limit foods that already contain a lot of sodium. Eat a diet rich in fruits and vegetables and whole grains.

## 2018-02-07 NOTE — Telephone Encounter (Signed)
Patient called to say that her BP is elevated on 02/05/2018 it was 141/80 and 02/06/2018 131/90 and today its 161/89. Stated that she took 2 enalapril (VASOTEC) 10 MG tablet and will check BP again. Stated that when she woke up her head felt tight so that's why she checked her BP. Patient stated that she has been having issues with her BP since the Dr Sanda Klein took her off hydrochlorothiazide (MICROZIDE) 12.5 MG capsule. She also stated that on Sunday 02/05/2018 she was a little bloated hands and feet were puffy stated she could not get her ring on because her fingers were puffy. Requesting a call back from nurse to instruct her on what to do. Ph# [phone]   Spoke with patient- she thinks she needs 1/2 dose of her fluid pill and then she could take her regular dose of her enalapril. Patient thinks she needs the fluid pill- she could do every other day- if she thinks that would work- she just filled- it is a capsule.  Call to office- reported that patient has taken double dose of her BP medication this morning and is asking if she should take more. Patient has been scheduled to come to office to discuss her BP control and medication. She is bring her home cuff.  Reason for Disposition . Systolic BP  >= 180 OR Diastolic >= 110  Answer Assessment - Initial Assessment Questions 1. BLOOD PRESSURE: "What is the blood pressure?" "Did you take at least two measurements 5 minutes apart?"     16 1/89- patient took additional 10 mg of enalapril, now- 152/118 P85 2. ONSET: "When did you take your blood pressure?"     This morning 3. HOW: "How did you obtain the blood pressure?" (e.g., visiting nurse, automatic home BP monitor)     Automatic cuff 4. HISTORY: "Do you have a history of high blood pressure?"     yes 5. MEDICATIONS: "Are you taking any medications for blood pressure?" "Have you missed any doses recently?"     Yes- patient has been told to stop hydrochlorothiazide- and patient has had swelling since  stopped. 6. OTHER SYMPTOMS: "Do you have any symptoms?" (e.g., headache, chest pain, blurred vision, difficulty breathing, weakness)    Pressure in head 7. PREGNANCY: "Is there any chance you are pregnant?" "When was your last menstrual period?"     n/a  Protocols used: HIGH BLOOD PRESSURE-A-AH

## 2018-03-07 ENCOUNTER — Encounter: Payer: Self-pay | Admitting: Family Medicine

## 2018-03-07 ENCOUNTER — Ambulatory Visit (INDEPENDENT_AMBULATORY_CARE_PROVIDER_SITE_OTHER): Payer: PPO | Admitting: Family Medicine

## 2018-03-07 VITALS — BP 118/76 | Temp 97.5°F | Ht 65.0 in | Wt 274.9 lb

## 2018-03-07 DIAGNOSIS — E782 Mixed hyperlipidemia: Secondary | ICD-10-CM | POA: Diagnosis not present

## 2018-03-07 DIAGNOSIS — I10 Essential (primary) hypertension: Secondary | ICD-10-CM | POA: Diagnosis not present

## 2018-03-07 DIAGNOSIS — Z5181 Encounter for therapeutic drug level monitoring: Secondary | ICD-10-CM

## 2018-03-07 DIAGNOSIS — R7303 Prediabetes: Secondary | ICD-10-CM | POA: Diagnosis not present

## 2018-03-07 DIAGNOSIS — N183 Chronic kidney disease, stage 3 unspecified: Secondary | ICD-10-CM

## 2018-03-07 DIAGNOSIS — J441 Chronic obstructive pulmonary disease with (acute) exacerbation: Secondary | ICD-10-CM

## 2018-03-07 DIAGNOSIS — M1A0720 Idiopathic chronic gout, left ankle and foot, without tophus (tophi): Secondary | ICD-10-CM | POA: Diagnosis not present

## 2018-03-07 MED ORDER — ENALAPRIL MALEATE 10 MG PO TABS: 10.0000 mg | ORAL_TABLET | Freq: Every day | ORAL | 1 refills | Status: DC

## 2018-03-07 MED ORDER — ALLOPURINOL 100 MG PO TABS: 200.0000 mg | ORAL_TABLET | Freq: Every day | ORAL | 1 refills | Status: DC

## 2018-03-07 MED ORDER — ALBUTEROL SULFATE HFA 108 (90 BASE) MCG/ACT IN AERS: 2.0000 | INHALATION_SPRAY | RESPIRATORY_TRACT | 1 refills | Status: DC | PRN

## 2018-03-07 MED ORDER — HYDROCHLOROTHIAZIDE 12.5 MG PO CAPS: 12.5000 mg | ORAL_CAPSULE | Freq: Every day | ORAL | 1 refills | Status: DC

## 2018-03-07 NOTE — Progress Notes (Signed)
BP 118/76   Temp (!) 97.5 F (36.4 C)   Ht 5\' 5"  (1.651 m)   Wt 274 lb 14.4 oz (124.7 kg)   SpO2 95%   BMI 45.75 kg/m    Subjective:    Patient ID: Colleen [name], female    DOB: [birth year], 73 y.o.   MRN: [id number]  HPI: Colleen [name] is a 73 y.o. female  Chief Complaint  Patient presents with  . Follow-up    HPI She is here for f/u She went back to the HCTZ because she has having high blood pressure; also on 10mg  of the other BP pill; headaches gone Gout; she wants to take the HCTZ despite having gout; "I need it" COPD; on inhalers; "that's alright"; wants a refill of the Proair; using rescue inhaler just when wheezing; used it this morning, about 3x a week; using Symbicort just at night; I explained it is BID, but she says she cramps if she using it twice a day High cholesterol; on statin; she does not eat pork; loves cheese CKD, stage 3; drinking 64 ounces of water a day; avoiding NSAIDs Prediabetes; no dry mouth; no sugary drinks Morbid obesity; she has lost 10 pounds since last visit; she says a lot of it was fluid, on the new medicine and doing better Flu shot UTD  Lab Results  Component Value Date   CHOL 141 09/01/2017   HDL 59 09/01/2017   LDLCALC 66 09/01/2017   TRIG 76 09/01/2017   CHOLHDL 2.4 09/01/2017    Depression screen PHQ 2/9 03/07/2018 02/07/2018 02/03/2018 09/01/2017 09/01/2017  Decreased Interest 0 0 0 0 0  Down, Depressed, Hopeless 0 0 0 0 0  PHQ - 2 Score 0 0 0 0 0  Altered sleeping 0 0 0 0 -  Tired, decreased energy 0 0 0 0 -  Change in appetite 0 0 0 0 -  Feeling bad or failure about yourself  0 0 0 0 -  Trouble concentrating 0 0 0 0 -  Moving slowly or fidgety/restless 0 0 0 0 -  Suicidal thoughts 0 0 0 0 -  PHQ-9 Score 0 0 0 0 -  Difficult doing work/chores Not difficult at all Not difficult at all Not difficult at all Not difficult at all -   Fall Risk  03/07/2018 02/07/2018 02/03/2018 09/01/2017 09/01/2017  Falls in the past year? 1 1 1  No No   Number falls in past yr: 0 0 0 - -  Injury with Fall? 0 1 0 - -  Risk for fall due to : - Impaired balance/gait;Impaired mobility - Impaired vision;Impaired balance/gait;History of fall(s) -  Risk for fall due to: Comment - - - wears eyeglasses; avoids stairs, ambulates with walker; knee pain -    Relevant past medical, surgical, family and social history reviewed Past Medical History:  Diagnosis Date  . Asthma   . Cataract   . COPD (chronic obstructive pulmonary disease) ([place])   . GERD (gastroesophageal reflux disease)   . Gout   . Hyperlipidemia   . Hypertension   . Osteoarthrosis    Past Surgical History:  Procedure Laterality Date  . BREAST EXCISIONAL BIOPSY Left 04/08/2014   Intraductal papilloma-5 mm  . LUMBAR LAMINECTOMY    . TONSILLECTOMY     Family History  Problem Relation Age of Onset  . Diabetes Mother   . Lung cancer Father   . Diabetes Sister   . Congestive Heart Failure Sister   .  Glaucoma Sister   . Breast cancer Neg Hx    Social History   Tobacco Use  . Smoking status: Former Smoker    Packs/day: 1.50    Years: 20.00    Pack years: 30.00    Types: Cigarettes    Last attempt to quit: 11/13/2008    Years since quitting: 9.3  . Smokeless tobacco: Never Used  . Tobacco comment: smoking cessation materials not required  Substance Use Topics  . Alcohol use: Yes    Alcohol/week: 1.0 standard drinks    Types: 1 Cans of beer per week  . Drug use: No     Office Visit from 03/07/2018 in [facility]  AUDIT-C Score  0      Interim medical history since last visit reviewed. Allergies and medications reviewed  Review of Systems Per HPI unless specifically indicated above     Objective:    BP 118/76   Temp (!) 97.5 F (36.4 C)   Ht 5\' 5"  (1.651 m)   Wt 274 lb 14.4 oz (124.7 kg)   SpO2 95%   BMI 45.75 kg/m   Wt Readings from Last 3 Encounters:  03/07/18 274 lb 14.4 oz (124.7 kg)  02/07/18 284 lb 4.8 oz (129 kg)    02/03/18 281 lb 1.6 oz (127.5 kg)    Physical Exam Constitutional:      General: She is not in acute distress.    Appearance: She is well-developed. She is obese. She is not diaphoretic.  HENT:     Head: Normocephalic and atraumatic.  Eyes:     General: No scleral icterus. Neck:     Thyroid: No thyromegaly.  Cardiovascular:     Rate and Rhythm: Normal rate and regular rhythm.     Heart sounds: Normal heart sounds. No murmur.  Pulmonary:     Effort: Pulmonary effort is normal. No respiratory distress.     Breath sounds: Normal breath sounds. No wheezing.  Abdominal:     General: Bowel sounds are normal. There is no distension.     Palpations: Abdomen is soft.  Musculoskeletal:     Right lower leg: No edema.     Left lower leg: No edema.  Skin:    General: Skin is warm and dry.     Coloration: Skin is not pale.  Neurological:     Mental Status: She is alert.  Psychiatric:        Behavior: Behavior normal.        Thought Content: Thought content normal.        Judgment: Judgment normal.     Results for orders placed or performed during the hospital encounter of 10/30/17  CBC  Result Value Ref Range   WBC 7.3 3.6 - 11.0 K/uL   RBC 4.34 3.80 - 5.20 MIL/uL   Hemoglobin 12.7 12.0 - 16.0 g/dL   HCT 39.0 35.0 - 47.0 %   MCV 89.9 80.0 - 100.0 fL   MCH 29.2 26.0 - 34.0 pg   MCHC 32.5 32.0 - 36.0 g/dL   RDW 15.0 (H) 11.5 - 14.5 %   Platelets 294 150 - 440 K/uL  Basic metabolic panel  Result Value Ref Range   Sodium 138 135 - 145 mmol/L   Potassium 4.0 3.5 - 5.1 mmol/L   Chloride 103 98 - 111 mmol/L   CO2 28 22 - 32 mmol/L   Glucose, Bld 119 (H) 70 - 99 mg/dL   BUN 20 8 - 23 mg/dL  Creatinine, Ser 1.06 (H) 0.44 - 1.00 mg/dL   Calcium 9.3 8.9 - 10.3 mg/dL   GFR calc non Af Amer 52 (L) >60 mL/min   GFR calc Af Amer 60 (L) >60 mL/min   Anion gap 7 5 - 15  Influenza panel by PCR (type A & B)  Result Value Ref Range   Influenza A By PCR NEGATIVE NEGATIVE   Influenza B  By PCR NEGATIVE NEGATIVE      Assessment & Plan:   Problem List Items Addressed This Visit      Cardiovascular and Mediastinum   Benign essential HTN (Chronic)    Controlled on hctz and ACE-I      Relevant Medications   hydrochlorothiazide (MICROZIDE) 12.5 MG capsule   enalapril (VASOTEC) 10 MG tablet     Respiratory   COPD (chronic obstructive pulmonary disease) (HCC) (Chronic)    Stable, continue medicine      Relevant Medications   albuterol (PROAIR HFA) 108 (90 Base) MCG/ACT inhaler     Genitourinary   Chronic kidney disease, stage III (moderate) (HCC) (Chronic)    Avoid NSAIDs, stay hydrated        Other   Prediabetes (Chronic)    Check glucose (truly fasting) and A1c; work on weight loss      Relevant Orders   Hemoglobin A1c   Morbid obesity (HCC) (Chronic)    Encouraged weight loss, see AVS; offered nutrition referral, she declined      Medication monitoring encounter    Check liver and kidneys      Relevant Orders   COMPLETE METABOLIC PANEL WITH GFR   HLD (hyperlipidemia) (Chronic)   Relevant Medications   hydrochlorothiazide (MICROZIDE) 12.5 MG capsule   enalapril (VASOTEC) 10 MG tablet   Other Relevant Orders   Lipid panel    Other Visit Diagnoses    Essential hypertension    -  Primary   Relevant Medications   hydrochlorothiazide (MICROZIDE) 12.5 MG capsule   enalapril (VASOTEC) 10 MG tablet   Idiopathic chronic gout of left foot without tophus       Relevant Medications   allopurinol (ZYLOPRIM) 100 MG tablet   Other Relevant Orders   Uric acid       Follow up plan: No follow-ups on file.  An after-visit summary was printed and given to the patient at [place].  Please see the patient instructions which may contain other information and recommendations beyond what is mentioned above in the assessment and plan.  Meds ordered this encounter  Medications  . hydrochlorothiazide (MICROZIDE) 12.5 MG capsule    Sig: Take 1 capsule (12.5  mg total) by mouth daily.    Dispense:  90 capsule    Refill:  1  . enalapril (VASOTEC) 10 MG tablet    Sig: Take 1 tablet (10 mg total) by mouth daily.    Dispense:  90 tablet    Refill:  1  . allopurinol (ZYLOPRIM) 100 MG tablet    Sig: Take 2 tablets (200 mg total) by mouth daily.    Dispense:  180 tablet    Refill:  1  . albuterol (PROAIR HFA) 108 (90 Base) MCG/ACT inhaler    Sig: Inhale 2 puffs into the lungs every 4 (four) hours as needed for wheezing or shortness of breath.    Dispense:  1 Inhaler    Refill:  1    Orders Placed This Encounter  Procedures  . Lipid panel  . Hemoglobin A1c  . COMPLETE  METABOLIC PANEL WITH GFR  . Uric acid

## 2018-03-07 NOTE — Assessment & Plan Note (Signed)
Controlled on hctz and ACE-I

## 2018-03-07 NOTE — Patient Instructions (Addendum)
Check out the information at familydoctor.org entitled "Nutrition for Weight Loss: What You Need to Know about Fad Diets" Try to lose between 1-2 pounds per week by taking in fewer calories and burning off more calories You can succeed by limiting portions, limiting foods dense in calories and fat, becoming more active, and drinking 8 glasses of water a day (64 ounces) Don't skip meals, especially breakfast, as skipping meals may alter your metabolism Do not use over-the-counter weight loss pills or gimmicks that claim rapid weight loss A healthy BMI (or body mass index) is between 18.5 and 24.9 You can calculate your ideal BMI at the NIH website ClubMonetize.fr  Try a probiotic for one month Limit broccoli and cauliflower and beans for one month   Obesity, Adult Obesity is the condition of having too much total body fat. Being overweight or obese means that your weight is greater than what is considered healthy for your body size. Obesity is determined by a measurement called BMI. BMI is an estimate of body fat and is calculated from height and weight. For adults, a BMI of 30 or higher is considered obese. Obesity can eventually lead to other health concerns and major illnesses, including:  Stroke.  Coronary artery disease (CAD).  Type 2 diabetes.  Some types of cancer, including cancers of the colon, breast, uterus, and gallbladder.  Osteoarthritis.  High blood pressure (hypertension).  High cholesterol.  Sleep apnea.  Gallbladder stones.  Infertility problems. What are the causes? The main cause of obesity is taking in (consuming) more calories than your body uses for energy. Other factors that contribute to this condition may include:  Being born with genes that make you more likely to become obese.  Having a medical condition that causes obesity. These conditions include: ? Hypothyroidism. ? Polycystic ovarian  syndrome (PCOS). ? Binge-eating disorder. ? Cushing syndrome.  Taking certain medicines, such as steroids, antidepressants, and seizure medicines.  Not being physically active (sedentary lifestyle).  Living where there are limited places to exercise safely or buy healthy foods.  Not getting enough sleep. What increases the risk? The following factors may increase your risk of this condition:  Having a family history of obesity.  Being a woman of African-American descent.  Being a man of Hispanic descent. What are the signs or symptoms? Having excessive body fat is the main symptom of this condition. How is this diagnosed? This condition may be diagnosed based on:  Your symptoms.  Your medical history.  A physical exam. Your health care provider may measure: ? Your BMI. If you are an adult with a BMI between 25 and less than 30, you are considered overweight. If you are an adult with a BMI of 30 or higher, you are considered obese. ? The distances around your hips and your waist (circumferences). These may be compared to each other to help diagnose your condition. ? Your skinfold thickness. Your health care provider may gently pinch a fold of your skin and measure it. How is this treated? Treatment for this condition often includes changing your lifestyle. Treatment may include some or all of the following:  Dietary changes. Work with your health care provider and a dietitian to set a weight-loss goal that is healthy and reasonable for you. Dietary changes may include eating: ? Smaller portions. A portion size is the amount of a particular food that is healthy for you to eat at one time. This varies from person to person. ? Low-calorie or low-fat options. ?  More whole grains, fruits, and vegetables.  Regular physical activity. This may include aerobic activity (cardio) and strength training.  Medicine to help you lose weight. Your health care provider may prescribe medicine  if you are unable to lose 1 pound a week after 6 weeks of eating more healthily and doing more physical activity.  Surgery. Surgical options may include gastric banding and gastric bypass. Surgery may be done if: ? Other treatments have not helped to improve your condition. ? You have a BMI of 40 or higher. ? You have life-threatening health problems related to obesity. Follow these instructions at home:  Eating and drinking   Follow recommendations from your health care provider about what you eat and drink. Your health care provider may advise you to: ? Limit fast foods, sweets, and processed snack foods. ? Choose low-fat options, such as low-fat milk instead of whole milk. ? Eat 5 or more servings of fruits or vegetables every day. ? Eat at home more often. This gives you more control over what you eat. ? Choose healthy foods when you eat out. ? Learn what a healthy portion size is. ? Keep low-fat snacks on hand. ? Avoid sugary drinks, such as soda, fruit juice, iced tea sweetened with sugar, and flavored milk. ? Eat a healthy breakfast.  Drink enough water to keep your urine clear or pale yellow.  Do not go without eating for long periods of time (do not fast) or follow a fad diet. Fasting and fad diets can be unhealthy and even dangerous. Physical Activity  Exercise regularly, as told by your health care provider. Ask your health care provider what types of exercise are safe for you and how often you should exercise.  Warm up and stretch before being active.  Cool down and stretch after being active.  Rest between periods of activity. Lifestyle  Limit the time that you spend in front of your TV, computer, or video game system.  Find ways to reward yourself that do not involve food.  Limit alcohol intake to no more than 1 drink a day for nonpregnant women and 2 drinks a day for men. One drink equals 12 oz of beer, 5 oz of wine, or 1 oz of hard liquor. General  instructions  Keep a weight loss journal to keep track of the food you eat and how much you exercise you get.  Take over-the-counter and prescription medicines only as told by your health care provider.  Take vitamins and supplements only as told by your health care provider.  Consider joining a support group. Your health care provider may be able to recommend a support group.  Keep all follow-up visits as told by your health care provider. This is important. Contact a health care provider if:  You are unable to meet your weight loss goal after 6 weeks of dietary and lifestyle changes. This information is not intended to replace advice given to you by your health care provider. Make sure you discuss any questions you have with your health care provider. Document Released: 02/19/2004 Document Revised: 06/16/2015 Document Reviewed: 10/30/2014 Elsevier Interactive Patient Education  2019 Elsevier Inc.  Preventing Unhealthy [employer], Adult Staying at a healthy weight is important to your overall health. When fat builds up in your body, you may become overweight or obese. Being overweight or obese increases your risk of developing certain health problems, such as heart disease, diabetes, sleeping problems, joint problems, and some types of cancer. Unhealthy weight gain is  often the result of making unhealthy food choices or not getting enough exercise. You can make changes to your lifestyle to prevent obesity and stay as healthy as possible. What nutrition changes can be made?   Eat only as much as your body needs. To do this: ? Pay attention to signs that you are hungry or full. Stop eating as soon as you feel full. ? If you feel hungry, try drinking water first before eating. Drink enough water so your urine is clear or pale yellow. ? Eat smaller portions. Pay attention to portion sizes when eating out. ? Look at serving sizes on food labels. Most foods contain more than one serving per  container. ? Eat the recommended number of calories for your gender and activity level. For most active people, a daily total of 2,000 calories is appropriate. If you are trying to lose weight or are not very active, you may need to eat fewer calories. Talk with your health care provider or a diet and nutrition specialist (dietitian) about how many calories you need each day.  Choose healthy foods, such as: ? Fruits and vegetables. At each meal, try to fill at least half of your plate with fruits and vegetables. ? Whole grains, such as whole-wheat bread, brown rice, and quinoa. ? Lean meats, such as chicken or fish. ? Other healthy proteins, such as beans, eggs, or tofu. ? Healthy fats, such as nuts, seeds, fatty fish, and olive oil. ? Low-fat or fat-free dairy products.  Check food labels, and avoid food and drinks that: ? Are high in calories. ? Have added sugar. ? Are high in sodium. ? Have saturated fats or trans fats.  Cook foods in healthier ways, such as by baking, broiling, or grilling.  Make a meal plan for the week, and shop with a grocery list to help you stay on track with your purchases. Try to avoid going to the grocery store when you are hungry.  When grocery shopping, try to shop around the outside of the store first, where the fresh foods are. Doing this helps you to avoid prepackaged foods, which can be high in sugar, salt (sodium), and fat. What lifestyle changes can be made?   Exercise for 30 or more minutes on 5 or more days each week. Exercising may include brisk walking, yard work, biking, running, swimming, and team sports like basketball and soccer. Ask your health care provider which exercises are safe for you.  Do muscle-strengthening activities, such as lifting weights or using resistance bands, on 2 or more days a week.  Do not use any products that contain nicotine or tobacco, such as cigarettes and e-cigarettes. If you need help quitting, ask your health  care provider.  Limit alcohol intake to no more than 1 drink a day for nonpregnant women and 2 drinks a day for men. One drink equals 12 oz of beer, 5 oz of wine, or 1 oz of hard liquor.  Try to get 7-9 hours of sleep each night. What other changes can be made?  Keep a food and activity journal to keep track of: ? What you ate and how many calories you had. Remember to count the calories in sauces, dressings, and side dishes. ? Whether you were active, and what exercises you did. ? Your calorie, weight, and activity goals.  Check your weight regularly. Track any changes. If you notice you have gained weight, make changes to your diet or activity routine.  Avoid taking weight-loss medicines  or supplements. Talk to your health care provider before starting any new medicine or supplement.  Talk to your health care provider before trying any new diet or exercise plan. Why are these changes important? Eating healthy, staying active, and having healthy habits can help you to prevent obesity. Those changes also:  Help you manage stress and emotions.  Help you connect with friends and family.  Improve your self-esteem.  Improve your sleep.  Prevent long-term health problems. What can happen if changes are not made? Being obese or overweight can cause you to develop joint or bone problems, which can make it hard for you to stay active or do activities you enjoy. Being obese or overweight also puts stress on your heart and lungs and can lead to health problems like diabetes, heart disease, and some cancers. Where to find more information Talk with your health care provider or a dietitian about healthy eating and healthy lifestyle choices. You may also find information from:  U.S. Department of Agriculture, MyPlate: FormerBoss.no  American Heart Association: www.heart.org  Centers for Disease Control and Prevention: http://www.wolf.info/ Summary  Staying at a healthy weight is important  to your overall health. It helps you to prevent certain diseases and health problems, such as heart disease, diabetes, joint problems, sleep disorders, and some types of cancer.  Being obese or overweight can cause you to develop joint or bone problems, which can make it hard for you to stay active or do activities you enjoy.  You can prevent unhealthy weight gain by eating a healthy diet, exercising regularly, not smoking, limiting alcohol, and getting enough sleep.  Talk with your health care provider or a dietitian for guidance about healthy eating and healthy lifestyle choices. This information is not intended to replace advice given to you by your health care provider. Make sure you discuss any questions you have with your health care provider. Document Released: 01/13/2016 Document Revised: 10/22/2016 Document Reviewed: 02/18/2016 Elsevier Interactive Patient Education  2019 [employer].

## 2018-03-07 NOTE — Assessment & Plan Note (Signed)
Avoid NSAIDs, stay hydrated 

## 2018-03-07 NOTE — Assessment & Plan Note (Signed)
Encouraged weight loss, see AVS; offered nutrition referral, she declined

## 2018-03-07 NOTE — Assessment & Plan Note (Signed)
Check glucose (truly fasting) and A1c; work on weight loss

## 2018-03-07 NOTE — Assessment & Plan Note (Signed)
Stable, continue medicine

## 2018-03-07 NOTE — Assessment & Plan Note (Signed)
Check liver and kidneys 

## 2018-03-08 ENCOUNTER — Telehealth: Payer: Self-pay

## 2018-03-08 ENCOUNTER — Other Ambulatory Visit: Payer: Self-pay | Admitting: Family Medicine

## 2018-03-08 DIAGNOSIS — Z5181 Encounter for therapeutic drug level monitoring: Secondary | ICD-10-CM

## 2018-03-08 DIAGNOSIS — M1A0720 Idiopathic chronic gout, left ankle and foot, without tophus (tophi): Secondary | ICD-10-CM

## 2018-03-08 LAB — COMPLETE METABOLIC PANEL WITH GFR
AG RATIO: 1.1 (calc) (ref 1.0–2.5)
ALKALINE PHOSPHATASE (APISO): 107 U/L (ref 37–153)
ALT: 6 U/L (ref 6–29)
AST: 15 U/L (ref 10–35)
Albumin: 4 g/dL (ref 3.6–5.1)
BILIRUBIN TOTAL: 0.4 mg/dL (ref 0.2–1.2)
BUN/Creatinine Ratio: 23 (calc) — ABNORMAL HIGH (ref 6–22)
BUN: 28 mg/dL — ABNORMAL HIGH (ref 7–25)
CHLORIDE: 99 mmol/L (ref 98–110)
CO2: 30 mmol/L (ref 20–32)
Calcium: 10.4 mg/dL (ref 8.6–10.4)
Creat: 1.21 mg/dL — ABNORMAL HIGH (ref 0.60–0.93)
GFR, EST NON AFRICAN AMERICAN: 45 mL/min/{1.73_m2} — AB (ref >=60)
GFR, Est African American: 52 mL/min/{1.73_m2} — ABNORMAL LOW (ref >=60)
Globulin: 3.7 g/dL (calc) (ref 1.9–3.7)
Glucose, Bld: 110 mg/dL — ABNORMAL HIGH (ref 65–99)
POTASSIUM: 4.1 mmol/L (ref 3.5–5.3)
Sodium: 136 mmol/L (ref 135–146)
Total Protein: 7.7 g/dL (ref 6.1–8.1)

## 2018-03-08 LAB — HEMOGLOBIN A1C
EAG (MMOL/L): 7.1 (calc)
Hgb A1c MFr Bld: 6.1 % of total Hgb — ABNORMAL HIGH (ref <5.7)
Mean Plasma Glucose: 128 (calc)

## 2018-03-08 LAB — LIPID PANEL
CHOL/HDL RATIO: 2.3 (calc) (ref <5.0)
CHOLESTEROL: 150 mg/dL (ref <200)
HDL: 65 mg/dL (ref >=50)
LDL CHOLESTEROL (CALC): 68 mg/dL
NON-HDL CHOLESTEROL (CALC): 85 mg/dL (ref <130)
TRIGLYCERIDES: 91 mg/dL (ref <150)

## 2018-03-08 LAB — URIC ACID: URIC ACID, SERUM: 4.7 mg/dL (ref 2.5–7.0)

## 2018-03-08 MED ORDER — ALLOPURINOL 100 MG PO TABS: 150.0000 mg | ORAL_TABLET | Freq: Every day | ORAL | 1 refills | Status: DC

## 2018-03-08 NOTE — Progress Notes (Signed)
Colleen [name], please let the patient know that her LDL is excellent; her HDL is great too Her A1c shows good control of her sugar; keep trying to lose weight, so it doesn't go up next time Kidney function declined a little bit; avoid NSAIDs and stay hydrated I'd like to decrease her allopurinol from 200 mg to just 150 mg a day to see if that's a good dose to help her kidneys and still keep her uric acid low ORDER uric acid and BMP to be done in 4 weeks

## 2018-03-08 NOTE — Progress Notes (Signed)
Decrease allopurinol a bit, recheck BMP and uric acid in 4 weeks

## 2018-03-08 NOTE — Telephone Encounter (Signed)
Patient notified, labs ordered.

## 2018-04-05 ENCOUNTER — Telehealth: Payer: Self-pay | Admitting: Family Medicine

## 2018-04-05 DIAGNOSIS — L309 Dermatitis, unspecified: Secondary | ICD-10-CM

## 2018-04-05 MED ORDER — TRIAMCINOLONE ACETONIDE 0.1 % EX CREA: TOPICAL_CREAM | CUTANEOUS | 0 refills | Status: DC

## 2018-04-05 NOTE — Telephone Encounter (Signed)
Rx sent 

## 2018-04-05 NOTE — Telephone Encounter (Signed)
Copied from [place] [id number]. Topic: Quick Communication - Rx Refill/Question >> Apr 05, 2018 10:47 AM Andria Frames L wrote: Medication: triamcinolone cream (KENALOG) 0.1 % [[id number]]  will cancel dermatology appointment because of all the sick people.    Has the patient contacted their pharmacy? No Preferred Pharmacy (with phone number or street name):[facility] (N),  - [place] [phone] (Phone) [phone] (Fax)  Agent: Please be advised that RX refills may take up to 3 business days. We ask that you follow-up with your pharmacy.

## 2018-04-06 ENCOUNTER — Other Ambulatory Visit: Payer: Self-pay

## 2018-04-06 DIAGNOSIS — Z5181 Encounter for therapeutic drug level monitoring: Secondary | ICD-10-CM | POA: Diagnosis not present

## 2018-04-07 ENCOUNTER — Other Ambulatory Visit: Payer: Self-pay | Admitting: Family Medicine

## 2018-04-07 DIAGNOSIS — E7800 Pure hypercholesterolemia, unspecified: Secondary | ICD-10-CM

## 2018-04-07 LAB — BASIC METABOLIC PANEL WITH GFR
BUN/Creatinine Ratio: 21 (calc) (ref 6–22)
BUN: 26 mg/dL — ABNORMAL HIGH (ref 7–25)
CALCIUM: 9.9 mg/dL (ref 8.6–10.4)
CO2: 29 mmol/L (ref 20–32)
Chloride: 101 mmol/L (ref 98–110)
Creat: 1.23 mg/dL — ABNORMAL HIGH (ref 0.60–0.93)
GFR, EST AFRICAN AMERICAN: 51 mL/min/{1.73_m2} — AB (ref >=60)
GFR, EST NON AFRICAN AMERICAN: 44 mL/min/{1.73_m2} — AB (ref >=60)
Glucose, Bld: 94 mg/dL (ref 65–99)
POTASSIUM: 4.4 mmol/L (ref 3.5–5.3)
Sodium: 138 mmol/L (ref 135–146)

## 2018-04-07 LAB — URIC ACID: Uric Acid, Serum: 5.8 mg/dL (ref 2.5–7.0)

## 2018-04-07 NOTE — Telephone Encounter (Signed)
Lab Results  Component Value Date   ALT 6 03/07/2018   Lab Results  Component Value Date   CHOL 150 03/07/2018   HDL 65 03/07/2018   LDLCALC 68 03/07/2018   TRIG 91 03/07/2018   CHOLHDL 2.3 03/07/2018   Rx approved

## 2018-04-07 NOTE — Telephone Encounter (Signed)
Copied from [place]. Topic: Quick Communication - Rx Refill/Question >> Apr 07, 2018  9:57 AM Wynetta Emery, [place] C wrote: Medication: lovastatin (MEVACOR) 40 MG tablet  -- 90 day supply   Has the patient contacted their pharmacy? Yes  (Agent: If no, request that the patient contact the pharmacy for the refill.) (Agent: If yes, when and what did the pharmacy advise?)  Preferred Pharmacy (with phone number or street name): [facility] (N), [place] - [place] [phone] (Phone) [phone] (Fax)    Agent: Please be advised that RX refills may take up to 3 business days. We ask that you follow-up with your pharmacy.

## 2018-04-07 NOTE — Telephone Encounter (Signed)
Duplicate request I already responded to statin refill

## 2018-04-08 NOTE — Telephone Encounter (Signed)
I sent the lovastatin yesterday to same pharmacy, 90 d supply Receipt confirmed  lovastatin (MEVACOR) 40 MG tablet 90 tablet 1 04/07/2018    Sig: TAKE 1 TABLET BY MOUTH AT BEDTIME   Sent to pharmacy as: lovastatin (MEVACOR) 40 MG tablet   E-Prescribing Status: Receipt confirmed by pharmacy (04/07/2018 12:47 PM EDT)

## 2018-05-29 ENCOUNTER — Telehealth: Payer: Self-pay

## 2018-05-29 DIAGNOSIS — L309 Dermatitis, unspecified: Secondary | ICD-10-CM

## 2018-05-29 NOTE — Telephone Encounter (Signed)
Copied from [place] [phone]. Topic: General - Inquiry >> May 25, 2018 12:44 PM Virl Axe D wrote: Reason for CRM: Pt stated Dr. Sanda Klein prescribed her a cream that burns when she uses it. She would like for Dr. Sanda Klein to prescribe something different , specifically Fougera 80g. Please advise.  [facility] (N), [place] [phone] (Phone) [phone] (Fax)

## 2018-05-29 NOTE — Telephone Encounter (Signed)
Natividad Brood is a Counsellor, not the name of a drug. Please get clarification.

## 2018-05-30 MED ORDER — TRIAMCINOLONE ACETONIDE 0.1 % EX CREA: TOPICAL_CREAM | CUTANEOUS | 0 refills | Status: DC

## 2018-05-30 NOTE — Telephone Encounter (Signed)
Pt called back, and what I think the problem is, is the pharmacy changed manufacturers of the med? Triamcinolone cream.  She states it is different and it is burning her face.  She states because lada gave different size that's why it is different.  She was used to getting 80g tube and lada gave smaller tube.  I told her it is the same medicine just smaller tube.  But her pharmacy may have changed the manufactures of med and that is why she is having problem which is out of our control.  She is not getting it, when I try to explain and I could be wrong.  Can you explain or try to give her new/ different med?

## 2018-05-30 NOTE — Telephone Encounter (Signed)
I sent in the 80g tube with a request to the pharmacy to use previous manufacturer.  Let patient know that I've requested that.  Let us know if the new tube I just sent in is still causing burning, and we will have to re-evaluate.

## 2018-05-30 NOTE — Telephone Encounter (Signed)
She states she did call  Pharmacy and they told her same thing their manufacturer changed and they could not get any other. So pt denied rx at this time

## 2018-05-30 NOTE — Telephone Encounter (Signed)
Pt was told to stop taking if making her skin burn

## 2018-05-30 NOTE — Telephone Encounter (Signed)
Tried to call no voicemail.

## 2018-05-30 NOTE — Addendum Note (Signed)
Addended by: Hubbard Hartshorn on: 05/30/2018 01:20 PM   Modules accepted: Orders

## 2018-06-27 ENCOUNTER — Telehealth: Payer: Self-pay | Admitting: Family Medicine

## 2018-06-27 NOTE — Telephone Encounter (Signed)
@  [facility] Chronic Care Management   Outreach Note  06/27/2018 Name: SHERIECE JEFCOAT MRN: [id number] DOB: Jun 08, 1945  Referred by: Arnetha Courser, MD Reason for referral : Chronic Care Management (Initial CCM outreach call was unsuccessful.)   An unsuccessful telephone outreach was attempted today. The patient was referred to the case management team by for assistance with chronic care management and care coordination.   Follow Up Plan: The care management team will reach out to the patient again over the next 7 days.  If patient returns call to provider office, please advise to call [facility] at [place]  [email or web address]   ??[phone]

## 2018-07-04 NOTE — Telephone Encounter (Signed)
°  Chronic Care Management   Outreach Note  07/04/2018 Name: Colleen [name] MRN: [id number] DOB: Mar 08, 1945  Referred by: Arnetha Courser, MD Reason for referral : Chronic Care Management (Initial CCM outreach call was unsuccessful.) and Chronic Care Management (Second CCM outreach call was unsuccessful)   A second unsuccessful telephone outreach was attempted today. The patient was referred to the case management team for assistance with chronic care management and care coordination.   Follow Up Plan: The care management team will reach out to the patient again over the next 7 days.   [place]  [email or web address]   ??[phone]

## 2018-07-05 ENCOUNTER — Ambulatory Visit: Payer: Self-pay | Admitting: *Deleted

## 2018-07-05 DIAGNOSIS — Z1159 Encounter for screening for other viral diseases: Secondary | ICD-10-CM | POA: Diagnosis not present

## 2018-07-05 DIAGNOSIS — J019 Acute sinusitis, unspecified: Secondary | ICD-10-CM | POA: Diagnosis not present

## 2018-07-05 DIAGNOSIS — R05 Cough: Secondary | ICD-10-CM | POA: Diagnosis not present

## 2018-07-05 NOTE — Telephone Encounter (Signed)
Pt called stating that she thought she had a cold from sleeping with the air on 2 weeks ago. She has a congested productive cough. The sputum is white with some tint to it per patient.  She also has some shortness of breath at times with exertion.she has taken Robitussin for the congestion  This started yesterday. She denies fever, chest pain or nausea. She has a hx of COPD. She did have bronchitis in January and this feels like it.  She has not traveled or been in contact with any one that has been sick. She lives alone. Advised pt of having a virtual appointment. She did not think she could do one but will notify the practice for an appointment. [facility] notified for an appointment. Call transferred to the office.  Reason for Disposition . [1] MILD difficulty breathing (e.g., minimal/no SOB at rest, SOB with walking, pulse <100) AND [2] NEW-onset or WORSE than normal  Answer Assessment - Initial Assessment Questions 1. RESPIRATORY STATUS: "Describe your breathing?" (e.g., wheezing, shortness of breath, unable to speak, severe coughing)      Wheezing and shortness of breath 2. ONSET: "When did this breathing problem begin?"      yesterday 3. PATTERN "Does the difficult breathing come and go, or has it been constant since it started?"      Comes and goes 4. SEVERITY: "How bad is your breathing?" (e.g., mild, moderate, severe)    - MILD: No SOB at rest, mild SOB with walking, speaks normally in sentences, can lay down, no retractions, pulse < 100.    - MODERATE: SOB at rest, SOB with minimal exertion and prefers to sit, cannot lie down flat, speaks in phrases, mild retractions, audible wheezing, pulse 100-120.    - SEVERE: Very SOB at rest, speaks in single words, struggling to breathe, sitting hunched forward, retractions, pulse > 120      moderate 5. RECURRENT SYMPTOM: "Have you had difficulty breathing before?" If so, ask: "When was the last time?" and "What happened that  time?"      Yes in January and went to the hospital, got medication 6. CARDIAC HISTORY: "Do you have any history of heart disease?" (e.g., heart attack, angina, bypass surgery, angioplasty)      no 7. LUNG HISTORY: "Do you have any history of lung disease?"  (e.g., pulmonary embolus, asthma, emphysema)     COPD 8. CAUSE: "What do you think is causing the breathing problem?"      bronchitis 9. OTHER SYMPTOMS: "Do you have any other symptoms? (e.g., dizziness, runny nose, cough, chest pain, fever)     Cough, runny nose,  10. PREGNANCY: "Is there any chance you are pregnant?" "When was your last menstrual period?"       no 11. TRAVEL: "Have you traveled out of the country in the last month?" (e.g., travel history, exposures)       no  Protocols used: BREATHING DIFFICULTY-A-AH

## 2018-07-06 NOTE — Telephone Encounter (Signed)
Unfortunately, there are no appointments available before the weekend.  We are happy to see her next week, but if anything worsens, she needs to go to [facility] or ER for evaluation.

## 2018-07-07 NOTE — Telephone Encounter (Signed)
Patient went to UC and treated. She is feeling much better. She took a Covid test as well. She was treated with antibiotics, prednisone, and tessalon pearls along with inhaler.

## 2018-07-11 NOTE — Chronic Care Management (AMB) (Deleted)
°  Chronic Care Management   Outreach Note  07/11/2018 Name: Colleen [name] MRN: [id number] DOB: Oct 15, 1945  Referred by: Arnetha Courser, MD Reason for referral : Chronic Care Management (Initial CCM outreach call was unsuccessful.), Chronic Care Management (Second CCM outreach call was unsuccessful), and Chronic Care Management (Third CCM outreach was unsuccessful.)   Third unsuccessful telephone outreach was attempted today. The patient was referred to the case management team for assistance with chronic care management and care coordination. The patient's primary care provider has been notified of our unsuccessful attempts to make or maintain contact with the patient. The care management team is pleased to engage with this patient at any time in the future should he/she be interested in assistance from the care management team.   Follow Up Plan: The care management team is available to follow up with the patient after provider conversation with the patient regarding recommendation for care management engagement and subsequent re-referral to the care management team.  [place]  [email or web address]   ??[phone]

## 2018-07-11 NOTE — Chronic Care Management (AMB) (Signed)
Chronic Care Management   Note  07/11/2018 Name: Colleen [name] MRN: [id number] DOB: [birth year]  Colleen [name] is a 73 y.o. year old female who is a primary care patient of Lada, Satira Anis, MD. I reached out to Colleen [name] by phone today in response to a referral sent by [name].    [name] was given information about Chronic Care Management services today including:  1. CCM service includes personalized support from designated clinical staff supervised by her physician, including individualized plan of care and coordination with other care providers 2. 24/7 contact phone numbers for assistance for urgent and routine care needs. 3. Service will only be billed when office clinical staff spend 20 minutes or more in a month to coordinate care. 4. Only one practitioner may furnish and bill the service in a calendar month. 5. The patient may stop CCM services at any time (effective at the end of the month) by phone call to the office staff. 6. The patient will be responsible for cost sharing (co-pay) of up to 20% of the service fee (after annual deductible is met).  Patient agreed to services and verbal consent obtained.   Follow up plan: Telephone appointment with CCM team member scheduled for: 08/01/2018  [place]  ??[email or web address]   ??[phone]

## 2018-07-24 ENCOUNTER — Other Ambulatory Visit: Payer: Self-pay | Admitting: Family Medicine

## 2018-07-24 DIAGNOSIS — J449 Chronic obstructive pulmonary disease, unspecified: Secondary | ICD-10-CM

## 2018-07-31 ENCOUNTER — Ambulatory Visit (INDEPENDENT_AMBULATORY_CARE_PROVIDER_SITE_OTHER): Payer: PPO

## 2018-07-31 ENCOUNTER — Other Ambulatory Visit: Payer: Self-pay

## 2018-07-31 DIAGNOSIS — J441 Chronic obstructive pulmonary disease with (acute) exacerbation: Secondary | ICD-10-CM | POA: Diagnosis not present

## 2018-07-31 DIAGNOSIS — I10 Essential (primary) hypertension: Secondary | ICD-10-CM

## 2018-07-31 NOTE — Patient Instructions (Signed)
Thank you allowing the Chronic Care Management Team to be a part of your care! It was a pleasure speaking with you today!  1. Continue to take ALL your medications as prescribed. 2. Please check your Blood Pressure every other day for the next two weeks and record. We will review results during our next telephone visit in 2 weeks. 3. Please remain as active as you can. I encourage you to continue your weight loss plan so that you can receive the much needed ortho surgery on your knees. 4. I have included some education materials about high blood pressure below. Please take time to review and call me with any questions you may have.  CCM (Chronic Care Management) Team   Trish Fountain RN, BSN Nurse Care Coordinator  [phone]  Ruben Reason PharmD  Clinical Pharmacist  [phone]   [place], LCSW Clinical Social Worker [phone]  Goals Addressed            This Visit's Progress    My blood pressure has been running high since Dr. Sanda Klein adjusted my medicine (pt-stated)       Current Barriers:   Knowledge Deficits related to basic understanding of hypertension pathophysiology and self care management   Nurse Case Manager Clinical Goal(s):   Over the next 14 days, patient will demonstrate improved adherence to prescribed treatment plan for hypertension as evidenced by taking all medications as prescribed, monitoring and recording blood pressure as directed, adhering to low sodium/DASH diet  Interventions:   Evaluation of current treatment plan related to hypertension self management and patient's adherence to plan as established by provider.  Provided education to patient re: stroke prevention, s/s of heart attack and stroke, DASH diet, complications of uncontrolled blood pressure  Reviewed medications with patient and discussed importance of compliance  Discussed plans with patient for ongoing care management follow up and provided patient with direct  contact information for care management team  Advised patient, providing education and rationale, to monitor blood pressure daily and record, calling PCP for findings outside established parameters.   Reviewed scheduled/upcoming provider appointments including:   Patient Self Care Activities:   Self administers medications as prescribed  Attends all scheduled provider appointments  Calls provider office for new concerns, questions, or BP outside discussed parameters  Checks BP and records as discussed  Follows a low sodium diet/DASH diet  Initial goal documentation         Print copy of patient instructions provided.   Telephone follow up appointment with care management team member scheduled for: 2 weeks  SYMPTOMS OF A STROKE   You have any symptoms of stroke. "BE FAST" is an easy way to remember the main warning signs: ? B - Balance. Signs are dizziness, sudden trouble walking, or loss of balance. ? E - Eyes. Signs are trouble seeing or a sudden change in how you see. ? F - Face. Signs are sudden weakness or loss of feeling of the face, or the face or eyelid drooping on one side. ? A - Arms. Signs are weakness or loss of feeling in an arm. This happens suddenly and usually on one side of the body. ? S - Speech. Signs are sudden trouble speaking, slurred speech, or trouble understanding what people say. ? T - Time. Time to call emergency services. Write down what time symptoms started.  You have other signs of stroke, such as: ? A sudden, very bad headache with no known cause. ? Feeling sick to  your stomach (nausea). ? Throwing up (vomiting). ? Jerky movements you cannot control (seizure).  SYMPTOMS OF A HEART ATTACK  What are the signs or symptoms? Symptoms of this condition include:  Chest pain. It may feel like: ? Crushing or squeezing. ? Tightness, pressure, fullness, or heaviness.  Pain in the arm, neck, jaw, back, or upper body.  Shortness of  breath.  Heartburn.  Indigestion.  Nausea.  Cold sweats.  Feeling tired. Sudden lightheadedness.   Managing Your Hypertension Hypertension is commonly called high blood pressure. This is when the force of your blood pressing against the walls of your arteries is too strong. Arteries are blood vessels that carry blood from your heart throughout your body. Hypertension forces the heart to work harder to pump blood, and may cause the arteries to become narrow or stiff. Having untreated or uncontrolled hypertension can cause heart attack, stroke, kidney disease, and other problems.  What are blood pressure readings? A blood pressure reading consists of a higher number over a lower number. Ideally, your blood pressure should be below 120/80. The first ("top") number is called the systolic pressure. It is a measure of the pressure in your arteries as your heart beats. The second ("bottom") number is called the diastolic pressure. It is a measure of the pressure in your arteries as the heart relaxes.  What does my blood pressure reading mean? Blood pressure is classified into four stages. Based on your blood pressure reading, your health care provider may use the following stages to determine what type of treatment you need, if any. Systolic pressure and diastolic pressure are measured in a unit called mm Hg. Normal Systolic pressure: below 154. Diastolic pressure: below 80. Elevated Systolic pressure: 008-676. Diastolic pressure: below 80. Hypertension stage 1 Systolic pressure: 195-093. Diastolic pressure: 26-71. Hypertension stage 2 Systolic pressure: 245 or above. Diastolic pressure: 90 or above.  What health risks are associated with hypertension? Managing your hypertension is an important responsibility. Uncontrolled hypertension can lead to: A heart attack. A stroke. A weakened blood vessel (aneurysm). Heart failure. Kidney damage. Eye damage. Metabolic syndrome. Memory  and concentration problems.  What changes can I make to manage my hypertension? Hypertension can be managed by making lifestyle changes and possibly by taking medicines. Your health care provider will help you make a plan to bring your blood pressure within a normal range.  Eating and drinking Eat a diet that is high in fiber and potassium, and low in salt (sodium), added sugar, and fat. An example eating plan is called the DASH (Dietary Approaches to Stop Hypertension) diet. To eat this way: Eat plenty of fresh fruits and vegetables. Try to fill half of your plate at each meal with fruits and vegetables. Eat whole grains, such as whole wheat pasta, brown rice, or whole grain bread. Fill about one quarter of your plate with whole grains. Eat low-fat diary products. Avoid fatty cuts of meat, processed or cured meats, and poultry with skin. Fill about one quarter of your plate with lean proteins such as fish, chicken without skin, beans, eggs, and tofu. Avoid premade and processed foods. These tend to be higher in sodium, added sugar, and fat. Reduce your daily sodium intake. Most people with hypertension should eat less than 1,500 mg of sodium a day. Limit alcohol intake to no more than 1 drink a day for nonpregnant women and 2 drinks a day for men. One drink equals 12 oz of beer, 5 oz of wine, or 1 oz  of hard liquor.  Lifestyle Work with your health care provider to maintain a healthy body weight, or to lose weight. Ask what an ideal weight is for you. Get at least 30 minutes of exercise that causes your heart to beat faster (aerobic exercise) most days of the week. Activities may include walking, swimming, or biking. Include exercise to strengthen your muscles (resistance exercise), such as weight lifting, as part of your weekly exercise routine. Try to do these types of exercises for 30 minutes at least 3 days a week. Do not use any products that contain nicotine or tobacco, such as cigarettes  and e-cigarettes. If you need help quitting, ask your health care provider. Control any long-term (chronic) conditions you have, such as high cholesterol or diabetes.  Monitoring Monitor your blood pressure at home as told by your health care provider. Your personal target blood pressure may vary depending on your medical conditions, your age, and other factors. Have your blood pressure checked regularly, as often as told by your health care provider.  Working with your health care provider Review all the medicines you take with your health care provider because there may be side effects or interactions. Talk with your health care provider about your diet, exercise habits, and other lifestyle factors that may be contributing to hypertension. Visit your health care provider regularly. Your health care provider can help you create and adjust your plan for managing hypertension.  Will I need medicine to control my blood pressure? Your health care provider may prescribe medicine if lifestyle changes are not enough to get your blood pressure under control, and if: Your systolic blood pressure is 130 or higher. Your diastolic blood pressure is 80 or higher. Take medicines only as told by your health care provider. Follow the directions carefully. Blood pressure medicines must be taken as prescribed. The medicine does not work as well when you skip doses. Skipping doses also puts you at risk for problems.  Contact a health care provider if: You think you are having a reaction to medicines you have taken. You have repeated (recurrent) headaches. You feel dizzy. You have swelling in your ankles. You have trouble with your vision. Get help right away if: You develop a severe headache or confusion. You have unusual weakness or numbness, or you feel faint. You have severe pain in your chest or abdomen. You vomit repeatedly. You have trouble breathing. Marland Kitchen   DASH Eating Plan DASH stands for  "Dietary Approaches to Stop Hypertension." The DASH eating plan is a healthy eating plan that has been shown to reduce high blood pressure (hypertension). It may also reduce your risk for type 2 diabetes, heart disease, and stroke. The DASH eating plan may also help with weight loss. What are tips for following this plan?  General guidelines  Avoid eating more than 2,300 mg (milligrams) of salt (sodium) a day. If you have hypertension, you may need to reduce your sodium intake to 1,500 mg a day.  Limit alcohol intake to no more than 1 drink a day for nonpregnant women and 2 drinks a day for men. One drink equals 12 oz of beer, 5 oz of wine, or 1 oz of hard liquor.  Work with your health care provider to maintain a healthy body weight or to lose weight. Ask what an ideal weight is for you.  Get at least 30 minutes of exercise that causes your heart to beat faster (aerobic exercise) most days of the week. Activities may include  walking, swimming, or biking.  Work with your health care provider or diet and nutrition specialist (dietitian) to adjust your eating plan to your individual calorie needs. Reading food labels   Check food labels for the amount of sodium per serving. Choose foods with less than 5 percent of the Daily Value of sodium. Generally, foods with less than 300 mg of sodium per serving fit into this eating plan.  To find whole grains, look for the word "whole" as the first word in the ingredient list. Shopping  Buy products labeled as "low-sodium" or "no salt added."  Buy fresh foods. Avoid canned foods and premade or frozen meals. Cooking  Avoid adding salt when cooking. Use salt-free seasonings or herbs instead of table salt or sea salt. Check with your health care provider or pharmacist before using salt substitutes.  Do not fry foods. Cook foods using healthy methods such as baking, boiling, grilling, and broiling instead.  Cook with heart-healthy oils, such as olive,  canola, soybean, or sunflower oil. Meal planning  Eat a balanced diet that includes: ? 5 or more servings of fruits and vegetables each day. At each meal, try to fill half of your plate with fruits and vegetables. ? Up to 6-8 servings of whole grains each day. ? Less than 6 oz of lean meat, poultry, or fish each day. A 3-oz serving of meat is about the same size as a deck of cards. One egg equals 1 oz. ? 2 servings of low-fat dairy each day. ? A serving of nuts, seeds, or beans 5 times each week. ? Heart-healthy fats. Healthy fats called Omega-3 fatty acids are found in foods such as flaxseeds and coldwater fish, like sardines, salmon, and mackerel.  Limit how much you eat of the following: ? Canned or prepackaged foods. ? Food that is high in trans fat, such as fried foods. ? Food that is high in saturated fat, such as fatty meat. ? Sweets, desserts, sugary drinks, and other foods with added sugar. ? Full-fat dairy products.  Do not salt foods before eating.  Try to eat at least 2 vegetarian meals each week.  Eat more home-cooked food and less restaurant, buffet, and fast food.  When eating at a restaurant, ask that your food be prepared with less salt or no salt, if possible. What foods are recommended? The items listed may not be a complete list. Talk with your dietitian about what dietary choices are best for you. Grains Whole-grain or whole-wheat bread. Whole-grain or whole-wheat pasta. Brown rice. [place]. Bulgur. Whole-grain and low-sodium cereals. Pita bread. Low-fat, low-sodium crackers. Whole-wheat flour tortillas. Vegetables Fresh or frozen vegetables (raw, steamed, roasted, or grilled). Low-sodium or reduced-sodium tomato and vegetable juice. Low-sodium or reduced-sodium tomato sauce and tomato paste. Low-sodium or reduced-sodium canned vegetables. Fruits All fresh, dried, or frozen fruit. Canned fruit in natural juice (without added sugar). Meat and other protein  foods Skinless chicken or [place]. Ground chicken or [place]. Pork with fat trimmed off. Fish and seafood. Egg whites. Dried beans, peas, or lentils. Unsalted nuts, nut butters, and seeds. Unsalted canned beans. Lean cuts of beef with fat trimmed off. Low-sodium, lean deli meat. Dairy Low-fat (1%) or fat-free (skim) milk. Fat-free, low-fat, or reduced-fat cheeses. Nonfat, low-sodium ricotta or cottage cheese. Low-fat or nonfat yogurt. Low-fat, low-sodium cheese. Fats and oils Soft margarine without trans fats. Vegetable oil. Low-fat, reduced-fat, or light mayonnaise and salad dressings (reduced-sodium). Canola, safflower, olive, soybean, and sunflower oils. Avocado. Seasoning and other  foods Herbs. Spices. Seasoning mixes without salt. Unsalted popcorn and pretzels. Fat-free sweets.  What foods are not recommended? The items listed may not be a complete list. Talk with your dietitian about what dietary choices are best for you. Grains Baked goods made with fat, such as croissants, muffins, or some breads. Dry pasta or rice meal packs. Vegetables Creamed or fried vegetables. Vegetables in a cheese sauce. Regular canned vegetables (not low-sodium or reduced-sodium). Regular canned tomato sauce and paste (not low-sodium or reduced-sodium). Regular tomato and vegetable juice (not low-sodium or reduced-sodium). Angie Fava. Olives. Fruits Canned fruit in a light or heavy syrup. Fried fruit. Fruit in cream or butter sauce. Meat and other protein foods Fatty cuts of meat. Ribs. Fried meat. Berniece Salines. Sausage. Bologna and other processed lunch meats. Salami. Fatback. Hotdogs. Bratwurst. Salted nuts and seeds. Canned beans with added salt. Canned or smoked fish. Whole eggs or egg yolks. Chicken or [place] with skin. Dairy Whole or 2% milk, cream, and half-and-half. Whole or full-fat cream cheese. Whole-fat or sweetened yogurt. Full-fat cheese. Nondairy creamers. Whipped toppings. Processed cheese and cheese  spreads. Fats and oils Butter. Stick margarine. Lard. Shortening. Ghee. Bacon fat. Tropical oils, such as coconut, palm kernel, or palm oil. Seasoning and other foods Salted popcorn and pretzels. Onion salt, garlic salt, seasoned salt, table salt, and sea salt. Worcestershire sauce. Tartar sauce. Barbecue sauce. Teriyaki sauce. Soy sauce, including reduced-sodium. Steak sauce. Canned and packaged gravies. Fish sauce. Oyster sauce. Cocktail sauce. Horseradish that you find on the shelf. Ketchup. Mustard. Meat flavorings and tenderizers. Bouillon cubes. Hot sauce and Tabasco sauce. Premade or packaged marinades. Premade or packaged taco seasonings. Relishes. Regular salad dressings. Where to find more information:  National Heart, Lung, and [place]: https://wilson-eaton.com/  American Heart Association: www.heart.org Summary  The DASH eating plan is a healthy eating plan that has been shown to reduce high blood pressure (hypertension). It may also reduce your risk for type 2 diabetes, heart disease, and stroke.  With the DASH eating plan, you should limit salt (sodium) intake to 2,300 mg a day. If you have hypertension, you may need to reduce your sodium intake to 1,500 mg a day.  When on the DASH eating plan, aim to eat more fresh fruits and vegetables, whole grains, lean proteins, low-fat dairy, and heart-healthy fats.  Work with your health care provider or diet and nutrition specialist (dietitian) to adjust your eating plan to your individual calorie needs.

## 2018-07-31 NOTE — Chronic Care Management (AMB) (Signed)
  Chronic Care Management   Initial Visit Note  07/31/2018 Name: Colleen [name] MRN: [id number] DOB: 04/04/1945  Subjective: "I have a blood pressure machine but I just don't take it because it is always higher than when I am at the doctor"  Objective:  BP Readings from Last 3 Encounters:  03/07/18 118/76  02/07/18 (!) 157/84  02/03/18 118/64    Assessment: Colleen [name] is a 73 year old female patient of [facility] who was referred to the chronic Case Management program by her health plan. She was consented to services and today completed her initial health assessment and goal setting appointment with RN CM via telephone.  Review of patient status, including review of consultants reports, relevant laboratory and other test results, and collaboration with appropriate care team members and the patient's provider was performed as part of comprehensive patient evaluation and provision of chronic care management services.       Goals Addressed            This Visit's Progress   . My blood pressure has been running high since Dr. Sanda Klein adjusted my medicine (pt-stated)       Colleen [name] was diagnosed with hypertension years ago. She reports having a "low blood pressure" on her previous dose of medication prior to seeing Dr. Sanda Klein. States Dr. Sanda Klein decreased her BP medication and now her BP is running higher than it has been. She reports readings of 160s at her last medical appointment 1 month ago. She is doing her best to follow a low sodium diet. She is cooking at home and not adding any type of salt or salt seasoning. She is taking her BP medication as prescribed including her diuretic. She does not understand how her kidneys can be affected by ongoing untreated hypertension.    Current Barriers:  Marland Kitchen Knowledge Deficits related to basic understanding of hypertension pathophysiology and self care management   Nurse Case Manager Clinical Goal(s):  Marland Kitchen Over the  next 14 days, patient will demonstrate improved adherence to prescribed treatment plan for hypertension as evidenced by taking all medications as prescribed, monitoring and recording blood pressure as directed, adhering to low sodium/DASH diet  Interventions:  . Evaluation of current treatment plan related to hypertension self management and patient's adherence to plan as established by provider. . Provided education to patient re: stroke prevention, s/s of heart attack and stroke, DASH diet, complications of uncontrolled blood pressure . Reviewed medications with patient and discussed importance of compliance . Discussed plans with patient for ongoing care management follow up and provided patient with direct contact information for care management team . Advised patient, providing education and rationale, to monitor blood pressure daily and record, calling PCP for findings outside established parameters.  . Reviewed scheduled/upcoming provider appointments including:   Patient Self Care Activities:  . Self administers medications as prescribed . Attends all scheduled provider appointments . Calls provider office for new concerns, questions, or BP outside discussed parameters . Checks BP and records as discussed . Follows a low sodium diet/DASH diet  Initial goal documentation          Follow up plan:  Telephone follow up appointment with care management team member scheduled for: 2 weeks     Anyra Kaufman E. Rollene Rotunda, RN, BSN Nurse Care Coordinator [facility] Care Management  [phone]

## 2018-08-02 ENCOUNTER — Other Ambulatory Visit: Payer: Self-pay

## 2018-08-02 ENCOUNTER — Emergency Department
Admission: EM | Admit: 2018-08-02 | Discharge: 2018-08-02 | Disposition: A | Discharge status: Home / Self Care | Payer: PPO | Attending: Emergency Medicine | Admitting: Emergency Medicine

## 2018-08-02 ENCOUNTER — Encounter: Payer: Self-pay | Admitting: *Deleted

## 2018-08-02 DIAGNOSIS — R109 Unspecified abdominal pain: Secondary | ICD-10-CM | POA: Diagnosis not present

## 2018-08-02 DIAGNOSIS — Z87891 Personal history of nicotine dependence: Secondary | ICD-10-CM | POA: Diagnosis not present

## 2018-08-02 DIAGNOSIS — K9289 Other specified diseases of the digestive system: Secondary | ICD-10-CM

## 2018-08-02 DIAGNOSIS — R143 Flatulence: Secondary | ICD-10-CM | POA: Insufficient documentation

## 2018-08-02 DIAGNOSIS — R141 Gas pain: Secondary | ICD-10-CM | POA: Diagnosis not present

## 2018-08-02 DIAGNOSIS — Z79899 Other long term (current) drug therapy: Secondary | ICD-10-CM | POA: Diagnosis not present

## 2018-08-02 DIAGNOSIS — I129 Hypertensive chronic kidney disease with stage 1 through stage 4 chronic kidney disease, or unspecified chronic kidney disease: Secondary | ICD-10-CM | POA: Diagnosis not present

## 2018-08-02 DIAGNOSIS — N183 Chronic kidney disease, stage 3 (moderate): Secondary | ICD-10-CM | POA: Diagnosis not present

## 2018-08-02 DIAGNOSIS — J45909 Unspecified asthma, uncomplicated: Secondary | ICD-10-CM | POA: Diagnosis not present

## 2018-08-02 DIAGNOSIS — R140 Abdominal distension (gaseous): Secondary | ICD-10-CM | POA: Diagnosis present

## 2018-08-02 DIAGNOSIS — J449 Chronic obstructive pulmonary disease, unspecified: Secondary | ICD-10-CM | POA: Diagnosis not present

## 2018-08-02 DIAGNOSIS — Z7982 Long term (current) use of aspirin: Secondary | ICD-10-CM | POA: Insufficient documentation

## 2018-08-02 LAB — COMPREHENSIVE METABOLIC PANEL
ALT: 12 U/L (ref 0–44)
AST: 21 U/L (ref 15–41)
Albumin: 3.6 g/dL (ref 3.5–5.0)
Alkaline Phosphatase: 105 U/L (ref 38–126)
Anion gap: 9 (ref 5–15)
BUN: 21 mg/dL (ref 8–23)
CO2: 25 mmol/L (ref 22–32)
Calcium: 9.2 mg/dL (ref 8.9–10.3)
Chloride: 102 mmol/L (ref 98–111)
Creatinine, Ser: 1.03 mg/dL — ABNORMAL HIGH (ref 0.44–1.00)
GFR calc Af Amer: >60 mL/min (ref >60)
GFR calc non Af Amer: 54 mL/min — ABNORMAL LOW (ref >60)
Glucose, Bld: 101 mg/dL — ABNORMAL HIGH (ref 70–99)
Potassium: 3.8 mmol/L (ref 3.5–5.1)
Sodium: 136 mmol/L (ref 135–145)
Total Bilirubin: 0.3 mg/dL (ref 0.3–1.2)
Total Protein: 7.7 g/dL (ref 6.5–8.1)

## 2018-08-02 LAB — CBC
HCT: 37.8 % (ref 36.0–46.0)
Hemoglobin: 12 g/dL (ref 12.0–15.0)
MCH: 28.4 pg (ref 26.0–34.0)
MCHC: 31.7 g/dL (ref 30.0–36.0)
MCV: 89.6 fL (ref 80.0–100.0)
Platelets: 391 10*3/uL (ref 150–400)
RBC: 4.22 MIL/uL (ref 3.87–5.11)
RDW: 14.2 % (ref 11.5–15.5)
WBC: 9 10*3/uL (ref 4.0–10.5)
nRBC: 0 % (ref 0.0–0.2)

## 2018-08-02 LAB — LIPASE, BLOOD: Lipase: 38 U/L (ref 11–51)

## 2018-08-02 MED ORDER — SIMETHICONE 80 MG PO CHEW: 80.0000 mg | CHEWABLE_TABLET | Freq: Two times a day (BID) | ORAL | 2 refills | Status: DC

## 2018-08-02 MED ORDER — PANTOPRAZOLE SODIUM 40 MG PO TBEC: 40.0000 mg | DELAYED_RELEASE_TABLET | Freq: Every day | ORAL | 2 refills | Status: DC

## 2018-08-02 MED ORDER — PANTOPRAZOLE SODIUM 40 MG PO TBEC
40.0000 mg | DELAYED_RELEASE_TABLET | Freq: Once | ORAL | Status: AC
  Administered 2018-08-02: 40 mg via ORAL
  Filled 2018-08-02: qty 1

## 2018-08-02 MED ORDER — SIMETHICONE 40 MG/0.6ML PO SUSP (UNIT DOSE)
80.0000 mg | Freq: Once | ORAL | Status: AC
  Administered 2018-08-02: 80 mg via ORAL
  Filled 2018-08-02: qty 30

## 2018-08-02 NOTE — ED Provider Notes (Signed)
[facility] Emergency Department Provider Note  Time seen: 11:21 PM  I have reviewed the triage vital signs and the nursing notes.   HISTORY  Chief Complaint Abdominal Pain and Gastroesophageal Reflux   HPI Colleen [name] is a 73 y.o. female the past medical history of asthma, COPD, gastric reflux, hypertension, hyperlipidemia, presents to the emergency department for gas and abdominal discomfort.  According to the patient over the past 1 month she has had increased gas which she describes frequent belching, feels like her stomach is gurgling.  Patient states tonight she was having gas, states she bent over and felt a sharp pain in her upper abdomen so she came to the emergency department for evaluation.  States she has not had any pain since that initial discomfort.  Denies any abdominal pain.  Denies any fever.  No chest pain or shortness of breath.   Patient states the symptoms have been going on for approximately 1 month, she has not followed up with her doctor or a GI doctor.  States she cannot drink milk because it will make her too gassy, states she cannot eat spicy foods because she will have reflux/vomiting.  Past Medical History:  Diagnosis Date  . Asthma   . Cataract   . COPD (chronic obstructive pulmonary disease) ([place])   . GERD (gastroesophageal reflux disease)   . Gout   . Hyperlipidemia   . Hypertension   . Osteoarthrosis     Patient Active Problem List   Diagnosis Date Noted  . Medication monitoring encounter 09/01/2017  . Osteopenia 05/18/2017  . Chronic kidney disease, stage III (moderate) ([place]) 04/21/2017  . Morbid obesity ([place]) 04/21/2017  . Other allergic rhinitis 01/10/2015  . Allergic rhinitis, seasonal 07/30/2014  . Airway hyperreactivity 07/30/2014  . HLD (hyperlipidemia) 07/30/2014  . Encounter for immunization 07/30/2014  . Arthritis, degenerative 07/30/2014  . Prediabetes 07/30/2014  . Asthma, mild persistent 07/30/2014  .  COPD (chronic obstructive pulmonary disease) ([place]) 07/09/2008  . Benign essential HTN 06/22/2006    Past Surgical History:  Procedure Laterality Date  . BREAST EXCISIONAL BIOPSY Left 04/08/2014   Intraductal papilloma-5 mm  . LUMBAR LAMINECTOMY    . TONSILLECTOMY      Prior to Admission medications   Medication Sig Start Date End Date Taking? Authorizing Provider  allopurinol (ZYLOPRIM) 100 MG tablet Take 1.5 tablets (150 mg total) by mouth daily. 03/08/18 07/31/18  Arnetha Courser, MD  aspirin EC 81 MG tablet Take 81 mg by mouth daily.    [provider]  Blood Pressure Monitoring (BLOOD PRESSURE KIT) DEVI 1 Device by Does not apply route daily. Fluctuating blood pressure; please get appropriate size, large cuff 02/03/18   Lada, Satira Anis, MD  enalapril (VASOTEC) 10 MG tablet Take 1 tablet (10 mg total) by mouth daily. 03/07/18   Lada, Satira Anis, MD  fluticasone (FLONASE) 50 MCG/ACT nasal spray Place 2 sprays into both nostrils daily. Patient not taking: Reported on 02/03/2018 07/01/15   Adline Potter, MD  hydrochlorothiazide (MICROZIDE) 12.5 MG capsule Take 1 capsule (12.5 mg total) by mouth daily. 03/07/18   Arnetha Courser, MD  lovastatin (MEVACOR) 40 MG tablet TAKE 1 TABLET BY MOUTH AT BEDTIME 04/07/18   Lada, Satira Anis, MD  meclizine (ANTIVERT) 25 MG tablet TAKE ONE TABLET BY MOUTH THREE TIMES DAILY AS NEEDED FOR DIZZINESS Patient not taking: Reported on 07/31/2018 09/01/17   Arnetha Courser, MD  PROAIR HFA 108 [phone] Base) MCG/ACT inhaler INHALE  2 PUFFS BY MOUTH EVERY 4 HOURS AS NEEDED FOR WHEEZING FOR SHORTNESS OF BREATH 07/24/18   Hubbard Hartshorn, FNP  simethicone (GAS-X) 80 MG chewable tablet Chew 1 tablet (80 mg total) by mouth 4 (four) times daily as needed for flatulence. 06/04/16 09/01/17  Harvest Dark, MD  SYMBICORT 160-4.5 MCG/ACT inhaler Inhale 2 puffs by mouth twice daily 07/24/18   Hubbard Hartshorn, FNP  triamcinolone cream (KENALOG) 0.1 % APPLY CREAM EXTERNALLY TO AFFECTED AREA  TWICE DAILY if needed; not more than one week at a time, then take a break for two weeks 05/30/18   Hubbard Hartshorn, FNP    No Known Allergies  Family History  Problem Relation Age of Onset  . Diabetes Mother   . Lung cancer Father   . Diabetes Sister   . Congestive Heart Failure Sister   . Glaucoma Sister   . Breast cancer Neg Hx     Social History Social History   Tobacco Use  . Smoking status: Former Smoker    Packs/day: 1.50    Years: 20.00    Pack years: 30.00    Types: Cigarettes    Quit date: 11/13/2008    Years since quitting: 9.7  . Smokeless tobacco: Never Used  . Tobacco comment: smoking cessation materials not required  Substance Use Topics  . Alcohol use: Yes    Alcohol/week: 1.0 standard drinks    Types: 1 Cans of beer per week  . Drug use: No    Review of Systems Constitutional: Negative for fever. Cardiovascular: Negative for chest pain. Respiratory: Negative for shortness of breath. Gastrointestinal: Abdominal discomfort, gas.  Negative for diarrhea.  Normal bowel movement this morning.  Positive for intermittent nausea or vomiting especially early in the morning around 3 or 4:00 in the morning intermittently over the past 1 to 2 months. Genitourinary: Negative for urinary compaints Musculoskeletal: Negative for musculoskeletal complaints Skin: Negative for skin complaints  Neurological: Negative for headache All other ROS negative  ____________________________________________   PHYSICAL EXAM:  VITAL SIGNS: ED Triage Vitals  Enc Vitals Group     BP 08/02/18 1746 (!) 118/57     Pulse Rate 08/02/18 1746 98     Resp 08/02/18 1746 16     Temp --      Temp Source 08/02/18 1746 Oral     SpO2 08/02/18 1746 97 %     Weight 08/02/18 1747 274 lb 14.6 oz (124.7 kg)     Height --      Head Circumference --      Peak Flow --      Pain Score 08/02/18 1747 0     Pain Loc --      Pain Edu? --      Excl. in [place]? --     Constitutional: Alert and  oriented. Well appearing and in no distress. Eyes: Normal exam ENT      Head: Normocephalic and atraumatic.      Mouth/Throat: Mucous membranes are moist. Cardiovascular: Normal rate, regular rhythm.  Respiratory: Normal respiratory effort without tachypnea nor retractions. Breath sounds are clear  Gastrointestinal: Soft and nontender. No distention. Musculoskeletal: Nontender with normal range of motion in all extremities.  Neurologic:  Normal speech and language. No gross focal neurologic deficits Skin:  Skin is warm, dry and intact.  Psychiatric: Mood and affect are normal.   ____________________________________________   INITIAL IMPRESSION / ASSESSMENT AND PLAN / ED COURSE  Pertinent labs & imaging results that were  available during my care of the patient were reviewed by me and considered in my medical decision making (see chart for details).    Patient presents to the emergency department for reflux, gas symptoms in abdominal pain.  The abdominal pain was sharp and brief lasting only seconds after bending over.  Denies any pain whatsoever currently.  Patient's work-up is largely nonrevealing in the emergency department.  Lab work is within normal limits.  Overall the patient appears extremely well, has no concerns currently.  Has a completely benign abdominal exam.  Given the patient's increased gas discomfort and reflux over the past 1 month I believe the patient would benefit from a PPI as well as simethicone.  We will discharge the patient with these 2 prescriptions.  I also discussed GI follow-up.  Patient agreeable to plan of care.  Provided my normal abdominal pain return precautions for any fever or worsening discomfort.  Colleen [name] was evaluated in Emergency Department on 08/02/2018 for the symptoms described in the history of present illness. She was evaluated in the context of the global COVID-19 pandemic, which necessitated consideration that the patient might be at risk for  infection with the SARS-CoV-2 virus that causes COVID-19. Institutional protocols and algorithms that pertain to the evaluation of patients at risk for COVID-19 are in a state of rapid change based on information released by regulatory bodies including the CDC and federal and state organizations. These policies and algorithms were followed during the patient's care in the ED.  ____________________________________________   FINAL CLINICAL IMPRESSION(S) / ED DIAGNOSES  Abdominal pain Flatulence   Harvest Dark, MD 08/02/18 2324

## 2018-08-02 NOTE — ED Triage Notes (Signed)
Pt to ED reporting intermittent abd pain that worsens when bending over. Pt reports nausea and increased abd gas throughout the past week as well. No cough, fever or vomiting. One episode of diarrhea last week.

## 2018-09-05 ENCOUNTER — Ambulatory Visit (INDEPENDENT_AMBULATORY_CARE_PROVIDER_SITE_OTHER): Payer: PPO | Admitting: Nurse Practitioner

## 2018-09-05 ENCOUNTER — Other Ambulatory Visit: Payer: Self-pay | Admitting: Family Medicine

## 2018-09-05 ENCOUNTER — Other Ambulatory Visit: Payer: Self-pay

## 2018-09-05 ENCOUNTER — Ambulatory Visit (INDEPENDENT_AMBULATORY_CARE_PROVIDER_SITE_OTHER): Payer: PPO

## 2018-09-05 ENCOUNTER — Encounter: Payer: Self-pay | Admitting: Nurse Practitioner

## 2018-09-05 VITALS — BP 124/64 | HR 92 | Temp 96.9°F | Resp 14 | Ht 65.0 in | Wt 276.3 lb

## 2018-09-05 VITALS — BP 124/64 | HR 92 | Temp 96.9°F | Resp 16 | Ht 65.0 in | Wt 276.3 lb

## 2018-09-05 DIAGNOSIS — Z5181 Encounter for therapeutic drug level monitoring: Secondary | ICD-10-CM

## 2018-09-05 DIAGNOSIS — N183 Chronic kidney disease, stage 3 unspecified: Secondary | ICD-10-CM

## 2018-09-05 DIAGNOSIS — Z0000 Encounter for general adult medical examination without abnormal findings: Secondary | ICD-10-CM

## 2018-09-05 DIAGNOSIS — Z9181 History of falling: Secondary | ICD-10-CM

## 2018-09-05 DIAGNOSIS — R7303 Prediabetes: Secondary | ICD-10-CM | POA: Diagnosis not present

## 2018-09-05 DIAGNOSIS — M1A00X0 Idiopathic chronic gout, unspecified site, without tophus (tophi): Secondary | ICD-10-CM

## 2018-09-05 DIAGNOSIS — J441 Chronic obstructive pulmonary disease with (acute) exacerbation: Secondary | ICD-10-CM | POA: Diagnosis not present

## 2018-09-05 DIAGNOSIS — I700 Atherosclerosis of aorta: Secondary | ICD-10-CM | POA: Diagnosis not present

## 2018-09-05 DIAGNOSIS — E782 Mixed hyperlipidemia: Secondary | ICD-10-CM | POA: Diagnosis not present

## 2018-09-05 DIAGNOSIS — I10 Essential (primary) hypertension: Secondary | ICD-10-CM

## 2018-09-05 DIAGNOSIS — M8580 Other specified disorders of bone density and structure, unspecified site: Secondary | ICD-10-CM | POA: Diagnosis not present

## 2018-09-05 DIAGNOSIS — W19XXXD Unspecified fall, subsequent encounter: Secondary | ICD-10-CM | POA: Diagnosis not present

## 2018-09-05 DIAGNOSIS — Z1231 Encounter for screening mammogram for malignant neoplasm of breast: Secondary | ICD-10-CM

## 2018-09-05 DIAGNOSIS — K2100 Gastro-esophageal reflux disease with esophagitis, without bleeding: Secondary | ICD-10-CM

## 2018-09-05 MED ORDER — ALLOPURINOL 100 MG PO TABS: 150.0000 mg | ORAL_TABLET | Freq: Every day | ORAL | 1 refills | Status: DC

## 2018-09-05 MED ORDER — SYMBICORT 160-4.5 MCG/ACT IN AERO: 2.0000 | INHALATION_SPRAY | Freq: Two times a day (BID) | RESPIRATORY_TRACT | 0 refills | Status: DC

## 2018-09-05 MED ORDER — ADJUSTABLE ALUMINUM CANE MISC: 1.0000 | Freq: Once | 0 refills | Status: AC

## 2018-09-05 MED ORDER — ENALAPRIL MALEATE 10 MG PO TABS: 10.0000 mg | ORAL_TABLET | Freq: Every day | ORAL | 1 refills | Status: DC

## 2018-09-05 MED ORDER — LOVASTATIN 40 MG PO TABS: 40.0000 mg | ORAL_TABLET | Freq: Every day | ORAL | 1 refills | Status: DC

## 2018-09-05 MED ORDER — HYDROCHLOROTHIAZIDE 12.5 MG PO CAPS: 12.5000 mg | ORAL_CAPSULE | Freq: Every day | ORAL | 1 refills | Status: DC

## 2018-09-05 MED ORDER — FAMOTIDINE 20 MG PO TABS: 20.0000 mg | ORAL_TABLET | Freq: Every day | ORAL | 1 refills | Status: DC

## 2018-09-05 NOTE — Progress Notes (Signed)
Name: Colleen [name]   MRN: [id number]    DOB: [birth year]   Date:09/05/2018       Progress Note  Subjective  Chief Complaint  Chief Complaint  Patient presents with  . Follow-up    6 months    HPI  Hypertension Patient is on enalapril 52m daily, HCTZ 12.545mdaily.  Takes medications as prescribed with no missed doses a month.  She is not compliant with low-salt diet, eats a bit of canned foods.  Denies chest pain, headaches, blurry vision. BP Readings from Last 3 Encounters:  09/05/18 124/64  09/05/18 124/64  08/02/18 (!) 119/58    Hyperlipidemia Patient rx lovastatin [id number] Takes medications as prescribed with no missed doses a month.  Denies myalgias Lab Results  Component Value Date   CHOL 150 03/07/2018   HDL 65 03/07/2018   LDLCALC 68 03/07/2018   TRIG 91 03/07/2018   CHOLHDL 2.3 03/07/2018    Prediabetes Diet: eats mostly chicken, tur[place] fish, avoids beef in general. Eats a good amount of vegetables.  Denies polyphagia, polydipsia, polyuria.  Last GFR improved from 44 to 54. She is on ACEi Wt Readings from Last 3 Encounters:  09/05/18 276 lb 4.8 oz (125.3 kg)  09/05/18 276 lb 4.8 oz (125.3 kg)  08/02/18 274 lb 14.6 oz (124.7 kg)   Lab Results  Component Value Date   HGBA1C 6.1 (H) 03/07/2018    COPD Smoking history: 30 pack year history  Inhalers:symbircort 1 puff in the day time and 2 puffs at night time; Albuterol PRN Denies shortness of breath or wheezing.  Declines screening CT   Osteopenia Takes vitamin D and calcium supplementation Weight bearing exercises: just walks around home to clean and cook, needs knee replacements Assistive devices: walker when she goes out and cane at home.  Had a fall in January of this year with contusion, her current cane is clicking- locking mechanism may not be working well.  Last DEXA scan showed osteopenia -1.2 in femur and normal in forearm.   Gout Takes allopurinol 150 mg daily, states hasn't had  a flare in a few years. States she was having frequent flares previously.   GERD Was seen at urgent care last month due to bad acid reflux, has been taking PPI daily with relief of symptoms; has never trialed H2blocker.   PHQ2/9: Depression screen [id number] 09/05/2018 07/31/2018 03/07/2018 02/07/2018 02/03/2018  Decreased Interest 0 0 0 0 0  Down, Depressed, Hopeless 0 0 0 0 0  PHQ - 2 Score 0 0 0 0 0  Altered sleeping 0 - 0 0 0  Tired, decreased energy 0 - 0 0 0  Change in appetite 0 - 0 0 0  Feeling bad or failure about yourself  0 - 0 0 0  Trouble concentrating 0 - 0 0 0  Moving slowly or fidgety/restless 0 - 0 0 0  Suicidal thoughts 0 - 0 0 0  PHQ-9 Score 0 - 0 0 0  Difficult doing work/chores Not difficult at all - Not difficult at all Not difficult at all Not difficult at all     PHQ reviewed. Negative  Patient Active Problem List   Diagnosis Date Noted  . Aortic atherosclerosis ([place]8/11/2018  . Morbid obesity with BMI of 45.0-49.9, adult ([place]8/05/2017  . Osteopenia 05/18/2017  . Chronic kidney disease, stage III (moderate) ([place]CC[place]3/28/2019  . Morbid obesity ([place]3/28/2019  . Acquired trigger finger 02/28/2017  . Chronic gouty arthropathy without  tophi 11/29/2016  . Hypergammaglobulinemia 11/29/2016  . Osteoarthritis of both knees 11/29/2016  . Allergic rhinitis, seasonal 07/30/2014  . HLD (hyperlipidemia) 07/30/2014  . Osteoarthritis of knee 07/30/2014  . Prediabetes 07/30/2014  . Asthma, mild persistent 07/30/2014  . COPD (chronic obstructive pulmonary disease) ([place]) 07/09/2008  . Benign essential HTN 06/22/2006    Past Medical History:  Diagnosis Date  . Asthma   . Cataract   . COPD (chronic obstructive pulmonary disease) ([place])   . GERD (gastroesophageal reflux disease)   . Gout   . Hyperlipidemia   . Hypertension   . Osteoarthrosis   . Prediabetes     Past Surgical History:  Procedure Laterality Date  . BREAST EXCISIONAL BIOPSY Left 04/08/2014    Intraductal papilloma-5 mm  . LUMBAR LAMINECTOMY    . TONSILLECTOMY      Social History   Tobacco Use  . Smoking status: Former Smoker    Packs/day: 1.50    Years: 20.00    Pack years: 30.00    Types: Cigarettes    Quit date: 11/13/2008    Years since quitting: 9.8  . Smokeless tobacco: Never Used  . Tobacco comment: smoking cessation materials not required  Substance Use Topics  . Alcohol use: Not Currently    Alcohol/week: 1.0 standard drinks    Types: 1 Cans of beer per week     Current Outpatient Medications:  .  allopurinol (ZYLOPRIM) 100 MG tablet, Take 1.5 tablets (150 mg total) by mouth daily., Disp: 135 tablet, Rfl: 1 .  aspirin EC 81 MG tablet, Take 81 mg by mouth daily., Disp: , Rfl:  .  Blood Pressure Monitoring (BLOOD PRESSURE KIT) DEVI, 1 Device by Does not apply route daily. Fluctuating blood pressure; please get appropriate size, large cuff, Disp: 1 Device, Rfl: 0 .  enalapril (VASOTEC) 10 MG tablet, Take 1 tablet (10 mg total) by mouth daily., Disp: 90 tablet, Rfl: 1 .  famotidine (PEPCID) 20 MG tablet, Take 1 tablet (20 mg total) by mouth at bedtime. This is to replace protonix., Disp: 90 tablet, Rfl: 1 .  hydrochlorothiazide (MICROZIDE) 12.5 MG capsule, Take 1 capsule (12.5 mg total) by mouth daily., Disp: 90 capsule, Rfl: 1 .  lovastatin (MEVACOR) 40 MG tablet, Take 1 tablet (40 mg total) by mouth at bedtime., Disp: 90 tablet, Rfl: 1 .  Misc. Devices (ADJUSTABLE ALUMINUM CANE) MISC, 1 Device by Does not apply route once for 1 dose., Disp: 1 each, Rfl: 0 .  pantoprazole (PROTONIX) 40 MG tablet, Take 1 tablet (40 mg total) by mouth daily., Disp: 30 tablet, Rfl: 2 .  PROAIR HFA 108 (90 Base) MCG/ACT inhaler, INHALE 2 PUFFS BY MOUTH EVERY 4 HOURS AS NEEDED FOR WHEEZING FOR SHORTNESS OF BREATH, Disp: 18 g, Rfl: 0 .  simethicone (GAS-X) 80 MG chewable tablet, Chew 1 tablet (80 mg total) by mouth 2 (two) times a day., Disp: 60 tablet, Rfl: 2 .  SYMBICORT 160-4.5  MCG/ACT inhaler, Inhale 2 puffs into the lungs 2 (two) times daily., Disp: 33 g, Rfl: 0 .  triamcinolone cream (KENALOG) 0.1 %, APPLY CREAM EXTERNALLY TO AFFECTED AREA TWICE DAILY if needed; not more than one week at a time, then take a break for two weeks, Disp: 80 g, Rfl: 0 .  Vitamin D, Cholecalciferol, 25 MCG (1000 UT) TABS, Take by mouth., Disp: , Rfl:   No Known Allergies  ROS    No other specific complaints in a complete review of systems (except as listed  in HPI above).  Objective  Vitals:   09/05/18 0848  BP: 124/64  Pulse: 92  Resp: 14  Temp: (!) 96.9 F (36.1 C)  SpO2: 96%  Weight: 276 lb 4.8 oz (125.3 kg)  Height: '5\' 5"'  (1.651 m)     Body mass index is 45.98 kg/m.  Nursing Note and Vital Signs reviewed.  Physical Exam Constitutional: Patient appears well-developed and well-nourished. Obese  No distress.  HEENT: head atraumatic, normocephalic,  Cardiovascular: Normal rate, regular rhythm and normal heart sounds.  No murmur heard. mild BLE edema. Pulmonary/Chest: Effort normal and breath sounds normal. No respiratory distress. Abdominal: Soft.  There is no tenderness. Skin: no concerning rashes Psychiatric: Patient has a normal mood and affect. behavior is normal. Judgment and thought content normal.     No results found for this or any previous visit (from the past 48 hour(s)).  Assessment & Plan  1. Benign essential HTN  - enalapril (VASOTEC) 10 MG tablet; Take 1 tablet (10 mg total) by mouth daily.  Dispense: 90 tablet; Refill: 1 - hydrochlorothiazide (MICROZIDE) 12.5 MG capsule; Take 1 capsule (12.5 mg total) by mouth daily.  Dispense: 90 capsule; Refill: 1  2. Chronic obstructive pulmonary disease with acute exacerbation (HCC) - SYMBICORT 160-4.5 MCG/ACT inhaler; Inhale 2 puffs into the lungs 2 (two) times daily.  Dispense: 33 g; Refill: 0  3. Osteopenia, unspecified location - Cane adjustable wide base quad - Misc. Devices (ADJUSTABLE ALUMINUM  CANE) MISC; 1 Device by Does not apply route once for 1 dose.  Dispense: 1 each; Refill: 0  4. Chronic kidney disease, stage III (moderate) (HCC) Hydration, avoid NSAIDs  5. Mixed hyperlipidemia - Lipid Profile - lovastatin (MEVACOR) 40 MG tablet; Take 1 tablet (40 mg total) by mouth at bedtime.  Dispense: 90 tablet; Refill: 1  6. Prediabetes - HgB A1c  7. Morbid obesity ([place]) Discussed diet   8. Fall, subsequent encounter - Cane adjustable wide base quad - Misc. Devices (ADJUSTABLE ALUMINUM CANE) MISC; 1 Device by Does not apply route once for 1 dose.  Dispense: 1 each; Refill: 0  9. Medication monitoring encounter - COMPLETE METABOLIC PANEL WITH GFR  10. Aortic atherosclerosis (HCC) - lovastatin (MEVACOR) 40 MG tablet; Take 1 tablet (40 mg total) by mouth at bedtime.  Dispense: 90 tablet; Refill: 1  11. Chronic gouty arthropathy without tophi - allopurinol (ZYLOPRIM) 100 MG tablet; Take 1.5 tablets (150 mg total) by mouth daily.  Dispense: 135 tablet; Refill: 1 - Uric acid  12. GERD with esophagitis - famotidine (PEPCID) 20 MG tablet; Take 1 tablet (20 mg total) by mouth at bedtime. This is to replace protonix.  Dispense: 90 tablet; Refill: 1

## 2018-09-05 NOTE — Patient Instructions (Addendum)
Return Late September or October for flu shot - or go to [facility] - take famotidine 20mg  nightly to replace protonix, take protonix just as needed for acid reflux.    Diabetes Mellitus and Nutrition, Adult When you have diabetes (diabetes mellitus), it is very important to have healthy eating habits because your blood sugar (glucose) levels are greatly affected by what you eat and drink. Eating healthy foods in the appropriate amounts, at about the same times every day, can help you:  Control your blood glucose.  Lower your risk of heart disease.  Improve your blood pressure.  Reach or maintain a healthy weight. Every person with diabetes is different, and each person has different needs for a meal plan. Your health care provider may recommend that you work with a diet and nutrition specialist (dietitian) to make a meal plan that is best for you. Your meal plan may vary depending on factors such as:  The calories you need.  The medicines you take.  Your weight.  Your blood glucose, blood pressure, and cholesterol levels.  Your activity level.  Other health conditions you have, such as heart or kidney disease. How do carbohydrates affect me? Carbohydrates, also called carbs, affect your blood glucose level more than any other type of food. Eating carbs naturally raises the amount of glucose in your blood. Carb counting is a method for keeping track of how many carbs you eat. Counting carbs is important to keep your blood glucose at a healthy level, especially if you use insulin or take certain oral diabetes medicines. It is important to know how many carbs you can safely have in each meal. This is different for every person. Your dietitian can help you calculate how many carbs you should have at each meal and for each snack. Foods that contain carbs include:  Bread, cereal, rice, pasta, and crackers.  Potatoes and corn.  Peas, beans, and lentils.  Milk and yogurt.  Fruit and  juice.  Desserts, such as cakes, cookies, ice cream, and candy. How does alcohol affect me? Alcohol can cause a sudden decrease in blood glucose (hypoglycemia), especially if you use insulin or take certain oral diabetes medicines. Hypoglycemia can be a life-threatening condition. Symptoms of hypoglycemia (sleepiness, dizziness, and confusion) are similar to symptoms of having too much alcohol. If your health care provider says that alcohol is safe for you, follow these guidelines:  Limit alcohol intake to no more than 1 drink per day for nonpregnant women and 2 drinks per day for men. One drink equals 12 oz of beer, 5 oz of wine, or 1 oz of hard liquor.  Do not drink on an empty stomach.  Keep yourself hydrated with water, diet soda, or unsweetened iced tea.  Keep in mind that regular soda, juice, and other mixers may contain a lot of sugar and must be counted as carbs. What are tips for following this plan?  Reading food labels  Start by checking the serving size on the "Nutrition Facts" label of packaged foods and drinks. The amount of calories, carbs, fats, and other nutrients listed on the label is based on one serving of the item. Many items contain more than one serving per package.  Check the total grams (g) of carbs in one serving. You can calculate the number of servings of carbs in one serving by dividing the total carbs by 15. For example, if a food has 30 g of total carbs, it would be equal to 2 servings of  carbs.  Check the number of grams (g) of saturated and trans fats in one serving. Choose foods that have low or no amount of these fats.  Check the number of milligrams (mg) of salt (sodium) in one serving. Most people should limit total sodium intake to less than 2,300 mg per day.  Always check the nutrition information of foods labeled as "low-fat" or "nonfat". These foods may be higher in added sugar or refined carbs and should be avoided.  Talk to your dietitian to  identify your daily goals for nutrients listed on the label. Shopping  Avoid buying canned, premade, or processed foods. These foods tend to be high in fat, sodium, and added sugar.  Shop around the outside edge of the grocery store. This includes fresh fruits and vegetables, bulk grains, fresh meats, and fresh dairy. Cooking  Use low-heat cooking methods, such as baking, instead of high-heat cooking methods like deep frying.  Cook using healthy oils, such as olive, canola, or sunflower oil.  Avoid cooking with butter, cream, or high-fat meats. Meal planning  Eat meals and snacks regularly, preferably at the same times every day. Avoid going long periods of time without eating.  Eat foods high in fiber, such as fresh fruits, vegetables, beans, and whole grains. Talk to your dietitian about how many servings of carbs you can eat at each meal.  Eat 4-6 ounces (oz) of lean protein each day, such as lean meat, chicken, fish, eggs, or tofu. One oz of lean protein is equal to: ? 1 oz of meat, chicken, or fish. ? 1 egg. ?  cup of tofu.  Eat some foods each day that contain healthy fats, such as avocado, nuts, seeds, and fish. Lifestyle  Check your blood glucose regularly.  Exercise regularly as told by your health care provider. This may include: ? 150 minutes of moderate-intensity or vigorous-intensity exercise each week. This could be brisk walking, biking, or water aerobics. ? Stretching and doing strength exercises, such as yoga or weightlifting, at least 2 times a week.  Take medicines as told by your health care provider.  Do not use any products that contain nicotine or tobacco, such as cigarettes and e-cigarettes. If you need help quitting, ask your health care provider.  Work with a Social worker or diabetes educator to identify strategies to manage stress and any emotional and social challenges. Questions to ask a health care provider  Do I need to meet with a diabetes  educator?  Do I need to meet with a dietitian?  What number can I call if I have questions?  When are the best times to check my blood glucose? Where to find more information:  American Diabetes Association: diabetes.org  Academy of Nutrition and Dietetics: www.eatright.[employer] of Diabetes and Digestive and Kidney Diseases (NIH): [employer] Summary  A healthy meal plan will help you control your blood glucose and maintain a healthy lifestyle.  Working with a diet and nutrition specialist (dietitian) can help you make a meal plan that is best for you.  Keep in mind that carbohydrates (carbs) and alcohol have immediate effects on your blood glucose levels. It is important to count carbs and to use alcohol carefully. This information is not intended to replace advice given to you by your health care provider. Make sure you discuss any questions you have with your health care provider. Document Released: 10/08/2004 Document Revised: 12/24/2016 Document Reviewed: 02/16/2016 Elsevier Patient Education  2020 [place]  A low-purine eating plan involves making food choices to limit your intake of purine. Purine is a kind of uric acid. Too much uric acid in your blood can cause certain conditions, such as gout and kidney stones. Eating a low-purine diet can help control these conditions. What are tips for following this plan? Reading food labels   Avoid foods with saturated or Trans fat.  Check the ingredient list of grains-based foods, such as bread and cereal, to make sure that they contain whole grains.  Check the ingredient list of sauces or soups to make sure they do not contain meat or fish.  When choosing soft drinks, check the ingredient list to make sure they do not contain high-fructose corn syrup. Shopping  Buy plenty of fresh fruits and vegetables.  Avoid buying canned or fresh fish.  Buy dairy products labeled as  low-fat or nonfat.  Avoid buying premade or processed foods. These foods are often high in fat, salt (sodium), and added sugar. Cooking  Use olive oil instead of butter when cooking. Oils like olive oil, canola oil, and sunflower oil contain healthy fats. Meal planning  Learn which foods do or do not affect you. If you find out that a food tends to cause your gout symptoms to flare up, avoid eating that food. You can enjoy foods that do not cause problems. If you have any questions about a food item, talk with your dietitian or health care provider.  Limit foods high in fat, especially saturated fat. Fat makes it harder for your body to get rid of uric acid.  Choose foods that are lower in fat and are lean sources of protein. General guidelines  Limit alcohol intake to no more than 1 drink a day for nonpregnant women and 2 drinks a day for men. One drink equals 12 oz of beer, 5 oz of wine, or 1 oz of hard liquor. Alcohol can affect the way your body gets rid of uric acid.  Drink plenty of water to keep your urine clear or pale yellow. Fluids can help remove uric acid from your body.  If directed by your health care provider, take a vitamin C supplement.  Work with your health care provider and dietitian to develop a plan to achieve or maintain a healthy weight. Losing weight can help reduce uric acid in your blood. What foods are recommended? The items listed may not be a complete list. Talk with your dietitian about what dietary choices are best for you. Foods low in purines Foods low in purines do not need to be limited. These include:  All fruits.  All low-purine vegetables, pickles, and olives.  Breads, pasta, rice, cornbread, and popcorn. Cake and other baked goods.  All dairy foods.  Eggs, nuts, and nut butters.  Spices and condiments, such as salt, herbs, and vinegar.  Plant oils, butter, and margarine.  Water, sugar-free soft drinks, tea, coffee, and  cocoa.  Vegetable-based soups, broths, sauces, and gravies. Foods moderate in purines Foods moderate in purines should be limited to the amounts listed.   cup of asparagus, cauliflower, spinach, mushrooms, or green peas, each day.  2/3 cup uncooked oatmeal, each day.   cup dry wheat bran or wheat germ, each day.  2-3 ounces of meat or poultry, each day.  4-6 ounces of shellfish, such as crab, lobster, oysters, or shrimp, each day.  1 cup cooked beans, peas, or lentils, each day.  Soup, broths, or bouillon made from meat or fish. Limit  these foods as much as possible. What foods are not recommended? The items listed may not be a complete list. Talk with your dietitian about what dietary choices are best for you. Limit your intake of foods high in purines, including:  Beer and other alcohol.  Meat-based gravy or sauce.  Canned or fresh fish, such as: ? Anchovies, sardines, herring, and tuna. ? Mussels and scallops. ? Codfish, trout, and haddock.  Berniece Salines.  Organ meats, such as: ? Liver or kidney. ? Tripe. ? Sweetbreads (thymus gland or pancreas).  [employer] biochemist.  Yeast or yeast extract supplements.  Drinks sweetened with high-fructose corn syrup. Summary  Eating a low-purine diet can help control conditions caused by too much uric acid in the body, such as gout or kidney stones.  Choose low-purine foods, limit alcohol, and limit foods high in fat.  You will learn over time which foods do or do not affect you. If you find out that a food tends to cause your gout symptoms to flare up, avoid eating that food. This information is not intended to replace advice given to you by your health care provider. Make sure you discuss any questions you have with your health care provider. Document Released: 05/08/2010 Document Revised: 12/24/2016 Document Reviewed: 02/25/2016 Elsevier Patient Education  2020 [employer].

## 2018-09-05 NOTE — Telephone Encounter (Signed)
Copied from [place] [id number]. Topic: Quick Communication - Rx Refill/Question >> Sep 05, 2018 11:16 AM Mcneil, Ja-Kwan wrote: Medication: PROAIR HFA 108 (90 Base) MCG/ACT inhaler and triamcinolone cream (KENALOG) 0.1 %  Has the patient contacted their pharmacy? no  Preferred Pharmacy (with phone number or street name): [facility] (N[facility] [place] - [place] [phone] (Phone)  [phone] (Fax)  Agent: Please be advised that RX refills may take up to 3 business days. We ask that you follow-up with your pharmacy.

## 2018-09-05 NOTE — Patient Instructions (Addendum)
Colleen [name] , Thank you for taking time to come for your Medicare Wellness Visit. I appreciate your ongoing commitment to your health goals. Please review the following plan we discussed and let me know if I can assist you in the future.   Screening recommendations/referrals: Colonoscopy: Cologuard completed 10/20/16 Mammogram: done 11/10/17 Bone Density: done 05/18/17 Recommended yearly ophthalmology/optometry visit for glaucoma screening and checkup Recommended yearly dental visit for hygiene and checkup  Vaccinations: Influenza vaccine: done 10/25/17 Pneumococcal vaccine: done 02/21/13 Tdap vaccine: done 10/13/11 Shingles vaccine: Shingrix discussed. Please contact your pharmacy for coverage information.   Advanced directives: Advance directive discussed with you today. Even though you declined this today please call our office should you change your mind and we can give you the proper paperwork for you to fill out.  Conditions/risks identified: Continue healthy eating for desired weight loss  Next appointment: Please follow up in one year for your Medicare Annual Wellness visit.     Preventive Care 72 Years and Older, Female Preventive care refers to lifestyle choices and visits with your health care provider that can promote health and wellness. What does preventive care include?  A yearly physical exam. This is also called an annual well check.  Dental exams once or twice a year.  Routine eye exams. Ask your health care provider how often you should have your eyes checked.  Personal lifestyle choices, including:  Daily care of your teeth and gums.  Regular physical activity.  Eating a healthy diet.  Avoiding tobacco and drug use.  Limiting alcohol use.  Practicing safe sex.  Taking low-dose aspirin every day.  Taking vitamin and mineral supplements as recommended by your health care provider. What happens during an annual well check? The services and screenings  done by your health care provider during your annual well check will depend on your age, overall health, lifestyle risk factors, and family history of disease. Counseling  Your health care provider may ask you questions about your:  Alcohol use.  Tobacco use.  Drug use.  Emotional well-being.  Home and relationship well-being.  Sexual activity.  Eating habits.  History of falls.  Memory and ability to understand (cognition).  Work and work Statistician.  Reproductive health. Screening  You may have the following tests or measurements:  Height, weight, and BMI.  Blood pressure.  Lipid and cholesterol levels. These may be checked every 5 years, or more frequently if you are over 8 years old.  Skin check.  Lung cancer screening. You may have this screening every year starting at age 27 if you have a 30-pack-year history of smoking and currently smoke or have quit within the past 15 years.  Fecal occult blood test (FOBT) of the stool. You may have this test every year starting at age 30.  Flexible sigmoidoscopy or colonoscopy. You may have a sigmoidoscopy every 5 years or a colonoscopy every 10 years starting at age 50.  Hepatitis C blood test.  Hepatitis B blood test.  Sexually transmitted disease (STD) testing.  Diabetes screening. This is done by checking your blood sugar (glucose) after you have not eaten for a while (fasting). You may have this done every 1-3 years.  Bone density scan. This is done to screen for osteoporosis. You may have this done starting at age 67.  Mammogram. This may be done every 1-2 years. Talk to your health care provider about how often you should have regular mammograms. Talk with your health care provider about your test  results, treatment options, and if necessary, the need for more tests. Vaccines  Your health care provider may recommend certain vaccines, such as:  Influenza vaccine. This is recommended every year.  Tetanus,  diphtheria, and acellular pertussis (Tdap, Td) vaccine. You may need a Td booster every 10 years.  Zoster vaccine. You may need this after age 17.  Pneumococcal 13-valent conjugate (PCV13) vaccine. One dose is recommended after age 53.  Pneumococcal polysaccharide (PPSV23) vaccine. One dose is recommended after age 56. Talk to your health care provider about which screenings and vaccines you need and how often you need them. This information is not intended to replace advice given to you by your health care provider. Make sure you discuss any questions you have with your health care provider. Document Released: 02/07/2015 Document Revised: 10/01/2015 Document Reviewed: 11/12/2014 Elsevier Interactive Patient Education  2017 [employer] Prevention in the Home Falls can cause injuries. They can happen to people of all ages. There are many things you can do to make your home safe and to help prevent falls. What can I do on the outside of my home?  Regularly fix the edges of walkways and driveways and fix any cracks.  Remove anything that might make you trip as you walk through a door, such as a raised step or threshold.  Trim any bushes or trees on the path to your home.  Use bright outdoor lighting.  Clear any walking paths of anything that might make someone trip, such as rocks or tools.  Regularly check to see if handrails are loose or broken. Make sure that both sides of any steps have handrails.  Any raised decks and porches should have guardrails on the edges.  Have any leaves, snow, or ice cleared regularly.  Use sand or salt on walking paths during winter.  Clean up any spills in your garage right away. This includes oil or grease spills. What can I do in the bathroom?  Use night lights.  Install grab bars by the toilet and in the tub and shower. Do not use towel bars as grab bars.  Use non-skid mats or decals in the tub or shower.  If you need to sit down in  the shower, use a plastic, non-slip stool.  Keep the floor dry. Clean up any water that spills on the floor as soon as it happens.  Remove soap buildup in the tub or shower regularly.  Attach bath mats securely with double-sided non-slip rug tape.  Do not have throw rugs and other things on the floor that can make you trip. What can I do in the bedroom?  Use night lights.  Make sure that you have a light by your bed that is easy to reach.  Do not use any sheets or blankets that are too big for your bed. They should not hang down onto the floor.  Have a firm chair that has side arms. You can use this for support while you get dressed.  Do not have throw rugs and other things on the floor that can make you trip. What can I do in the kitchen?  Clean up any spills right away.  Avoid walking on wet floors.  Keep items that you use a lot in easy-to-reach places.  If you need to reach something above you, use a strong step stool that has a grab bar.  Keep electrical cords out of the way.  Do not use floor polish or wax that makes  floors slippery. If you must use wax, use non-skid floor wax.  Do not have throw rugs and other things on the floor that can make you trip. What can I do with my stairs?  Do not leave any items on the stairs.  Make sure that there are handrails on both sides of the stairs and use them. Fix handrails that are broken or loose. Make sure that handrails are as long as the stairways.  Check any carpeting to make sure that it is firmly attached to the stairs. Fix any carpet that is loose or worn.  Avoid having throw rugs at the top or bottom of the stairs. If you do have throw rugs, attach them to the floor with carpet tape.  Make sure that you have a light switch at the top of the stairs and the bottom of the stairs. If you do not have them, ask someone to add them for you. What else can I do to help prevent falls?  Wear shoes that:  Do not have high  heels.  Have rubber bottoms.  Are comfortable and fit you well.  Are closed at the toe. Do not wear sandals.  If you use a stepladder:  Make sure that it is fully opened. Do not climb a closed stepladder.  Make sure that both sides of the stepladder are locked into place.  Ask someone to hold it for you, if possible.  Clearly mark and make sure that you can see:  Any grab bars or handrails.  First and last steps.  Where the edge of each step is.  Use tools that help you move around (mobility aids) if they are needed. These include:  Canes.  Walkers.  Scooters.  Crutches.  Turn on the lights when you go into a dark area. Replace any light bulbs as soon as they burn out.  Set up your furniture so you have a clear path. Avoid moving your furniture around.  If any of your floors are uneven, fix them.  If there are any pets around you, be aware of where they are.  Review your medicines with your doctor. Some medicines can make you feel dizzy. This can increase your chance of falling. Ask your doctor what other things that you can do to help prevent falls. This information is not intended to replace advice given to you by your health care provider. Make sure you discuss any questions you have with your health care provider. Document Released: 11/07/2008 Document Revised: 06/19/2015 Document Reviewed: 02/15/2014 Elsevier Interactive Patient Education  2017 [employer]  Recombinant Zoster (Shingles) Vaccine: What You Need to Know 1. Why get vaccinated? Recombinant zoster (shingles) vaccine can prevent shingles. Shingles (also called herpes zoster, or just zoster) is a painful skin rash, usually with blisters. In addition to the rash, shingles can cause fever, headache, chills, or upset stomach. More rarely, shingles can lead to pneumonia, hearing problems, blindness, brain inflammation (encephalitis), or death. The most common complication of shingles is long-term nerve  pain called postherpetic neuralgia (PHN). PHN occurs in the areas where the shingles rash was, even after the rash clears up. It can last for months or years after the rash goes away. The pain from PHN can be severe and debilitating. About 10 to 18% of people who get shingles will experience PHN. The risk of PHN increases with age. An older adult with shingles is more likely to develop PHN and have longer lasting and more severe pain than a younger  person with shingles. Shingles is caused by the varicella zoster virus, the same virus that causes chickenpox. After you have chickenpox, the virus stays in your body and can cause shingles later in life. Shingles cannot be passed from one person to another, but the virus that causes shingles can spread and cause chickenpox in someone who had never had chickenpox or received chickenpox vaccine. 2. Recombinant shingles vaccine Recombinant shingles vaccine provides strong protection against shingles. By preventing shingles, recombinant shingles vaccine also protects against PHN. Recombinant shingles vaccine is the preferred vaccine for the prevention of shingles. However, a different vaccine, live shingles vaccine, may be used in some circumstances. The recombinant shingles vaccine is recommended for adults 50 years and older without serious immune problems. It is given as a two-dose series. This vaccine is also recommended for people who have already gotten another type of shingles vaccine, the live shingles vaccine. There is no live virus in this vaccine. Shingles vaccine may be given at the same time as other vaccines. 3. Talk with your health care provider Tell your vaccine provider if the person getting the vaccine:  Has had an allergic reaction after a previous dose of recombinant shingles vaccine, or has any severe, life-threatening allergies.  Is pregnant or breastfeeding.  Is currently experiencing an episode of shingles. In some cases, your health  care provider may decide to postpone shingles vaccination to a future visit. People with minor illnesses, such as a cold, may be vaccinated. People who are moderately or severely ill should usually wait until they recover before getting recombinant shingles vaccine. Your health care provider can give you more information. 4. Risks of a vaccine reaction  A sore arm with mild or moderate pain is very common after recombinant shingles vaccine, affecting about 80% of vaccinated people. Redness and swelling can also happen at the site of the injection.  Tiredness, muscle pain, headache, shivering, fever, stomach pain, and nausea happen after vaccination in more than half of people who receive recombinant shingles vaccine. In clinical trials, about 1 out of 6 people who got recombinant zoster vaccine experienced side effects that prevented them from doing regular activities. Symptoms usually went away on their own in 2 to 3 days. You should still get the second dose of recombinant zoster vaccine even if you had one of these reactions after the first dose. People sometimes faint after medical procedures, including vaccination. Tell your provider if you feel dizzy or have vision changes or ringing in the ears. As with any medicine, there is a very remote chance of a vaccine causing a severe allergic reaction, other serious injury, or death. 5. What if there is a serious problem? An allergic reaction could occur after the vaccinated person leaves the clinic. If you see signs of a severe allergic reaction (hives, swelling of the face and throat, difficulty breathing, a fast heartbeat, dizziness, or weakness), call 9-1-1 and get the person to the nearest hospital. For other signs that concern you, call your health care provider. Adverse reactions should be reported to the Vaccine Adverse Event Reporting System (VAERS). Your health care provider will usually file this report, or you can do it yourself. Visit the  VAERS website at www.vaers.SamedayNews.es or call 912-713-9881. VAERS is only for reporting reactions, and VAERS staff do not give medical advice. 6. How can I learn more?  Ask your health care provider.  Call your local or state health department.  Contact the Centers for Disease Control and Prevention (CDC): ?  Call 720-660-5517 (1-800-CDC-INFO) or ? Visit CDC's website at http://hunter.com/ Vaccine Information Statement Recombinant Zoster Vaccine (11/23/2017) This information is not intended to replace advice given to you by your health care provider. Make sure you discuss any questions you have with your health care provider. Document Released: 03/23/2016 Document Revised: 05/02/2018 Document Reviewed: 08/17/2017 Elsevier Patient Education  2020 [employer].

## 2018-09-05 NOTE — Progress Notes (Signed)
Subjective:   Colleen [name] is a 73 y.o. female who presents for Medicare Annual (Subsequent) preventive examination.  Review of Systems:   Cardiac Risk Factors include: advanced age (>80mn, >>57women);dyslipidemia;hypertension;obesity (BMI >30kg/m2)     Objective:     Vitals: BP 124/64 (BP Location: Left Arm, Patient Position: Sitting, Cuff Size: Large)   Pulse 92   Temp (!) 96.9 F (36.1 C) (Temporal)   Resp 16   Ht '5\' 5"'  (1.651 m)   Wt 276 lb 4.8 oz (125.3 kg)   BMI 45.98 kg/m   Body mass index is 45.98 kg/m.  Advanced Directives 09/05/2018 08/02/2018 07/31/2018 10/30/2017 09/01/2017 11/05/2016 10/29/2016  Does Patient Have a Medical Advance Directive? No No No No No No No  Type of Advance Directive - - - - - - -  Does patient want to make changes to medical advance directive? No - Patient declined No - Patient declined No - Patient declined - - - -  Copy of [place] photographerin [place] Would patient like information on creating a medical advance directive? - No - Patient declined No - Patient declined No - Patient declined Yes (MAU/Ambulatory/Procedural Areas - Information given) No - Patient declined No - Patient declined    Tobacco Social History   Tobacco Use  Smoking Status Former Smoker  . Packs/day: 1.50  . Years: 20.00  . Pack years: 30.00  . Types: Cigarettes  . Quit date: 11/13/2008  . Years since quitting: 9.8  Smokeless Tobacco Never Used  Tobacco Comment   smoking cessation materials not required     Counseling given: Not Answered Comment: smoking cessation materials not required   Clinical Intake:  Pre-visit preparation completed: Yes  Pain : 0-10 Pain Score: 5  Pain Type: Chronic pain Pain Location: Knee Pain Orientation: Left Pain Descriptors / Indicators: Aching Pain Onset: More than a month ago Pain Frequency: Constant     BMI - recorded: 45.98 Nutritional Status: BMI > 30  Obese Nutritional Risks: None  Diabetes: No  How often do you need to have someone help you when you read instructions, pamphlets, or other written materials from your doctor or pharmacy?: 1 - Never  Interpreter Needed?: No  Information entered by :: KClemetine MarkerLPN  Past Medical History:  Diagnosis Date  . Asthma   . Cataract   . [place] obstructive [place] disease) [place]   . GERD (gastroesophageal reflux disease)   . Gout   . Hyperlipidemia   . Hypertension   . Osteoarthrosis   . Prediabetes    Past Surgical History:  Procedure Laterality Date  . BREAST EXCISIONAL BIOPSY Left 04/08/2014   Intraductal papilloma-5 mm  . LUMBAR LAMINECTOMY    . TONSILLECTOMY     Family History  Problem Relation Age of Onset  . Diabetes Mother   . Lung cancer Father   . Diabetes Sister   . Congestive Heart Failure Sister   . Glaucoma Sister   . Breast cancer Neg Hx    Social History   Socioeconomic History  . Marital status: Widowed    Spouse name: JTitus Dubin . Number of children: 0  . Years of education: Not on file  . Highest education level: 12th grade  Occupational History  . Occupation: Retired  SScientific laboratory technician . Financial resource strain: Not hard at all  . Food insecurity    Worry: Never true    Inability: Never  true  . Transportation needs    Medical: No    Non-medical: No  Tobacco Use  . Smoking status: Former Smoker    Packs/day: 1.50    Years: 20.00    Pack years: 30.00    Types: Cigarettes    Quit date: 11/13/2008    Years since quitting: 9.8  . Smokeless tobacco: Never Used  . Tobacco comment: smoking cessation materials not required  Substance and Sexual Activity  . Alcohol use: Not Currently    Alcohol/week: 1.0 standard drinks    Types: 1 Cans of beer per week  . Drug use: No  . Sexual activity: Not Currently  Lifestyle  . Physical activity    Days per week: 0 days    Minutes per session: 0 min  . Stress: Not at all  Relationships  . Social connections    Talks on  phone: More than three times a week    Gets together: Twice a week    Attends religious service: More than 4 times per year    Active member of club or organization: No    Attends meetings of clubs or organizations: Never    Relationship status: Widowed  Other Topics Concern  . Not on file  Social History Narrative  . Not on file    Outpatient Encounter Medications as of 09/05/2018  Medication Sig  . aspirin EC 81 MG tablet Take 81 mg by mouth daily.  . Blood Pressure Monitoring (BLOOD PRESSURE KIT) DEVI 1 Device by Does not apply route daily. Fluctuating blood pressure; please get appropriate size, large cuff  . enalapril (VASOTEC) 10 MG tablet Take 1 tablet (10 mg total) by mouth daily.  . hydrochlorothiazide (MICROZIDE) 12.5 MG capsule Take 1 capsule (12.5 mg total) by mouth daily.  Marland Kitchen lovastatin (MEVACOR) 40 MG tablet TAKE 1 TABLET BY MOUTH AT BEDTIME  . pantoprazole (PROTONIX) 40 MG tablet Take 1 tablet (40 mg total) by mouth daily.  Marland Kitchen PROAIR HFA 108 (90 Base) MCG/ACT inhaler INHALE 2 PUFFS BY MOUTH EVERY 4 HOURS AS NEEDED FOR WHEEZING FOR SHORTNESS OF BREATH  . simethicone (GAS-X) 80 MG chewable tablet Chew 1 tablet (80 mg total) by mouth 2 (two) times a day.  . SYMBICORT 160-4.5 MCG/ACT inhaler Inhale 2 puffs by mouth twice daily (Patient taking differently: Pt taking 1 puff AM 2 puffs PM)  . triamcinolone cream (KENALOG) 0.1 % APPLY CREAM EXTERNALLY TO AFFECTED AREA TWICE DAILY if needed; not more than one week at a time, then take a break for two weeks  . Vitamin D, Cholecalciferol, 25 MCG (1000 UT) TABS Take by mouth.  Marland Kitchen allopurinol (ZYLOPRIM) 100 MG tablet Take 1.5 tablets (150 mg total) by mouth daily.  . meclizine (ANTIVERT) 25 MG tablet TAKE ONE TABLET BY MOUTH THREE TIMES DAILY AS NEEDED FOR DIZZINESS (Patient not taking: Reported on 07/31/2018)  . [DISCONTINUED] fluticasone (FLONASE) 50 MCG/ACT nasal spray Place 2 sprays into both nostrils daily. (Patient not taking: Reported  on 02/03/2018)   No facility-administered encounter medications on file as of 09/05/2018.     Activities of Daily Living In your present state of health, do you have any difficulty performing the following activities: 09/05/2018 07/31/2018  Hearing? N N  Comment declines hearing aids -  Vision? N N  Comment wears glasses -  Difficulty concentrating or making decisions? N N  Walking or climbing stairs? Y N  Dressing or bathing? N N  Doing errands, shopping? N [employer]  and eating ? N N  Using the Toilet? N N  In the past six months, have you accidently leaked urine? Y N  Comment occasional urge incontinence -  Do you have problems with loss of bowel control? N N  Managing your Medications? N N  Managing your Finances? N N  Housekeeping or managing your Housekeeping? N N  Some recent data might be hidden    Patient Care Team: Lada, Satira Anis, MD as PCP - General (Family Medicine) Inda Castle, MD as Consulting Physician (Rheumatology) Benedetto Goad, RN as Registered Nurse    Assessment:   This is a routine wellness examination for Chance.  Exercise Activities and Dietary recommendations Current Exercise Habits: The patient does not participate in regular exercise at present, Exercise limited by: orthopedic condition(s);respiratory conditions(s)  Goals    . DIET - INCREASE WATER INTAKE     Recommend to drink at least 6-8 8oz glasses of water per day.    . My blood pressure has been running high since Dr. Sanda Klein adjusted my medicine (pt-stated)     Current Barriers:  Marland Kitchen Knowledge Deficits related to basic understanding of hypertension pathophysiology and self care management   Nurse Case Manager Clinical Goal(s):  Marland Kitchen Over the next 14 days, patient will demonstrate improved adherence to prescribed treatment plan for hypertension as evidenced by taking all medications as prescribed, monitoring and recording blood pressure as directed, adhering to low sodium/DASH diet   Interventions:  . Evaluation of current treatment plan related to hypertension self management and patient's adherence to plan as established by provider. . Provided education to patient re: stroke prevention, s/s of heart attack and stroke, DASH diet, complications of uncontrolled blood pressure . Reviewed medications with patient and discussed importance of compliance . Discussed plans with patient for ongoing care management follow up and provided patient with direct contact information for care management team . Advised patient, providing education and rationale, to monitor blood pressure daily and record, calling PCP for findings outside established parameters.  . Reviewed scheduled/upcoming provider appointments including:   Patient Self Care Activities:  . Self administers medications as prescribed . Attends all scheduled provider appointments . Calls provider office for new concerns, questions, or BP outside discussed parameters . Checks BP and records as discussed . Follows a low sodium diet/DASH diet  Initial goal documentation      . Reduce sugar intake     Continue decreasing sugars in daily diet. Pt to avoid breads, sodas and tea.,        Fall Risk Fall Risk  09/05/2018 07/31/2018 03/07/2018 02/07/2018 02/03/2018  Falls in the past year? 1 0 '1 1 1  ' Number falls in past yr: 1 - 0 0 0  Injury with Fall? 1 - 0 1 0  Comment head contusion - - - -  Risk for fall due to : Impaired balance/gait;Impaired mobility;History of fall(s) - - Impaired balance/gait;Impaired mobility -  Risk for fall due to: Comment - - - - -  Follow up Falls prevention discussed - - - -   FALL RISK PREVENTION PERTAINING TO THE HOME:  Any stairs in or around the home? Yes  If so, do they handrails? Yes   Home free of loose throw rugs in walkways, pet beds, electrical cords, etc? Yes  Adequate lighting in your home to reduce risk of falls? Yes   ASSISTIVE DEVICES UTILIZED TO PREVENT FALLS:  Life  alert? No  Use of a cane, walker or w/c?  Yes  Grab bars in the bathroom? No  Shower chair or bench in shower? Yes  Elevated toilet seat or a handicapped toilet? Yes   DME ORDERS:  DME order needed?  No   TIMED UP AND GO:  Was the test performed? Yes .  Length of time to ambulate 10 feet: 9 sec.   GAIT:  Appearance of gait: Gait slow, steady and with the use of an assistive device.   Education: Fall risk prevention has been discussed.  Intervention(s) required? No   Depression Screen PHQ 2/9 Scores 09/05/2018 07/31/2018 03/07/2018 02/07/2018  PHQ - 2 Score 0 0 0 0  PHQ- 9 Score 0 - 0 0     Cognitive Function     6CIT Screen 09/05/2018 09/01/2017 08/16/2016  What Year? 0 points 0 points 0 points  What month? 0 points 0 points 0 points  What time? 0 points 0 points 0 points  Count back from 20 0 points 4 points 0 points  Months in reverse 0 points 0 points 0 points  Repeat phrase 2 points 6 points 8 points  Total Score '2 10 8    ' Immunization History  Administered Date(s) Administered  . Influenza, High Dose Seasonal PF 10/04/2016, 10/12/2017  . Influenza-Unspecified 10/26/2014, 09/27/2015  . Pneumococcal Conjugate-13 02/21/2013  . Pneumococcal Polysaccharide-23 10/13/2011  . Tdap 10/13/2011    Qualifies for Shingles Vaccine? Yes . Due for Shingrix. Education has been provided regarding the importance of this vaccine. Pt has been advised to call insurance company to determine out of pocket expense. Advised may also receive vaccine at local pharmacy or Health Dept. Verbalized acceptance and understanding.  Tdap: Up to date  Flu Vaccine: Up to date  Pneumococcal Vaccine: Up to date   Screening Tests Health Maintenance  Topic Date Due  . INFLUENZA VACCINE  08/26/2018  . MAMMOGRAM  11/11/2018  . Fecal DNA (Cologuard)  10/21/2019  . TETANUS/TDAP  10/12/2021  . DEXA SCAN  Completed  . Hepatitis C Screening  Completed  . PNA vac Low Risk Adult  Completed   Cancer  Screenings:  Colorectal Screening: Cologuard completed 10/20/16. Repeat every 3 years;   Mammogram: Completed 11/10/17. Repeat every year;  Ordered today. Pt provided with contact information and advised to call to schedule appt.   Bone Density: Completed 05/18/17. Results reflect OSTEOPENIA. Repeat every 2 years.   Lung Cancer Screening: (Low Dose CT Chest recommended if Age 73-80 years, 30 pack-year currently smoking OR have quit w/in 15years.) does qualify. Quit 2010, pt declines screening at this time.   Additional Screening:  Hepatitis C Screening: does qualify; Completed 02/21/13  Vision Screening: Recommended annual ophthalmology exams for early detection of glaucoma and other disorders of the eye. Is the patient up to date with their annual eye exam?  Yes  Who is the provider or what is the name of the office in which the pt attends annual eye exams? [place] Screening: Recommended annual dental exams for proper oral hygiene  Community Resource Referral:  CRR required this visit?  No      Plan:    I have personally reviewed and addressed the Medicare Annual Wellness questionnaire and have noted the following in the patient's chart:  A. Medical and social history B. Use of alcohol, tobacco or illicit drugs  C. Current medications and supplements D. Functional ability and status E.  Nutritional status F.  Physical activity G. Advance directives H. List of other physicians I.  Hospitalizations, surgeries, and ER visits in previous 12 months J.  [place] such as hearing and vision if needed, cognitive and depression L. Referrals and appointments   In addition, I have reviewed and discussed with patient certain preventive protocols, quality metrics, and best practice recommendations. A written personalized care plan for preventive services as well as general preventive health recommendations were provided to patient.   Signed,  Clemetine Marker,  LPN Nurse Health Advisor   Nurse Notes:

## 2018-09-06 LAB — COMPLETE METABOLIC PANEL WITH GFR
AG Ratio: 1 (calc) (ref 1.0–2.5)
ALT: 9 U/L (ref 6–29)
AST: 16 U/L (ref 10–35)
Albumin: 3.8 g/dL (ref 3.6–5.1)
Alkaline phosphatase (APISO): 99 U/L (ref 37–153)
BUN/Creatinine Ratio: 23 (calc) — ABNORMAL HIGH (ref 6–22)
BUN: 27 mg/dL — ABNORMAL HIGH (ref 7–25)
CO2: 29 mmol/L (ref 20–32)
Calcium: 9.8 mg/dL (ref 8.6–10.4)
Chloride: 102 mmol/L (ref 98–110)
Creat: 1.16 mg/dL — ABNORMAL HIGH (ref 0.60–0.93)
GFR, Est African American: 54 mL/min/{1.73_m2} — ABNORMAL LOW (ref >=60)
GFR, Est Non African American: 47 mL/min/{1.73_m2} — ABNORMAL LOW (ref >=60)
Globulin: 3.9 g/dL (calc) — ABNORMAL HIGH (ref 1.9–3.7)
Glucose, Bld: 107 mg/dL — ABNORMAL HIGH (ref 65–99)
Potassium: 4.1 mmol/L (ref 3.5–5.3)
Sodium: 138 mmol/L (ref 135–146)
Total Bilirubin: 0.4 mg/dL (ref 0.2–1.2)
Total Protein: 7.7 g/dL (ref 6.1–8.1)

## 2018-09-06 LAB — HEMOGLOBIN A1C
Hgb A1c MFr Bld: 6.2 % of total Hgb — ABNORMAL HIGH (ref <5.7)
Mean Plasma Glucose: 131 (calc)
eAG (mmol/L): 7.3 (calc)

## 2018-09-06 LAB — LIPID PANEL
Cholesterol: 175 mg/dL (ref <200)
HDL: 70 mg/dL (ref >=50)
LDL Cholesterol (Calc): 89 mg/dL (calc)
Non-HDL Cholesterol (Calc): 105 mg/dL (calc) (ref <130)
Total CHOL/HDL Ratio: 2.5 (calc) (ref <5.0)
Triglycerides: 69 mg/dL (ref <150)

## 2018-09-06 LAB — URIC ACID: Uric Acid, Serum: 5.7 mg/dL (ref 2.5–7.0)

## 2018-09-06 MED ORDER — PROAIR HFA 108 (90 BASE) MCG/ACT IN AERS: INHALATION_SPRAY | RESPIRATORY_TRACT | 0 refills | Status: DC

## 2018-09-10 ENCOUNTER — Emergency Department: Payer: PPO

## 2018-09-10 ENCOUNTER — Observation Stay: Payer: PPO

## 2018-09-10 ENCOUNTER — Observation Stay
Admission: EM | Admit: 2018-09-10 | Discharge: 2018-09-10 | Disposition: A | Discharge status: Home / Self Care | Payer: PPO | Attending: Internal Medicine | Admitting: Internal Medicine

## 2018-09-10 ENCOUNTER — Encounter: Payer: Self-pay | Admitting: *Deleted

## 2018-09-10 ENCOUNTER — Other Ambulatory Visit: Payer: Self-pay

## 2018-09-10 DIAGNOSIS — J4530 Mild persistent asthma, uncomplicated: Secondary | ICD-10-CM | POA: Insufficient documentation

## 2018-09-10 DIAGNOSIS — M8580 Other specified disorders of bone density and structure, unspecified site: Secondary | ICD-10-CM | POA: Diagnosis not present

## 2018-09-10 DIAGNOSIS — K219 Gastro-esophageal reflux disease without esophagitis: Secondary | ICD-10-CM | POA: Diagnosis not present

## 2018-09-10 DIAGNOSIS — Z20828 Contact with and (suspected) exposure to other viral communicable diseases: Secondary | ICD-10-CM | POA: Diagnosis not present

## 2018-09-10 DIAGNOSIS — Z79899 Other long term (current) drug therapy: Secondary | ICD-10-CM | POA: Diagnosis not present

## 2018-09-10 DIAGNOSIS — J449 Chronic obstructive pulmonary disease, unspecified: Secondary | ICD-10-CM | POA: Insufficient documentation

## 2018-09-10 DIAGNOSIS — M4807 Spinal stenosis, lumbosacral region: Secondary | ICD-10-CM | POA: Diagnosis not present

## 2018-09-10 DIAGNOSIS — M109 Gout, unspecified: Secondary | ICD-10-CM | POA: Insufficient documentation

## 2018-09-10 DIAGNOSIS — G8929 Other chronic pain: Secondary | ICD-10-CM | POA: Diagnosis not present

## 2018-09-10 DIAGNOSIS — J45909 Unspecified asthma, uncomplicated: Secondary | ICD-10-CM | POA: Diagnosis not present

## 2018-09-10 DIAGNOSIS — Z6841 Body Mass Index (BMI) 40.0 and over, adult: Secondary | ICD-10-CM | POA: Diagnosis not present

## 2018-09-10 DIAGNOSIS — Z8249 Family history of ischemic heart disease and other diseases of the circulatory system: Secondary | ICD-10-CM | POA: Diagnosis not present

## 2018-09-10 DIAGNOSIS — M48061 Spinal stenosis, lumbar region without neurogenic claudication: Secondary | ICD-10-CM | POA: Diagnosis not present

## 2018-09-10 DIAGNOSIS — R531 Weakness: Secondary | ICD-10-CM | POA: Insufficient documentation

## 2018-09-10 DIAGNOSIS — E785 Hyperlipidemia, unspecified: Secondary | ICD-10-CM | POA: Insufficient documentation

## 2018-09-10 DIAGNOSIS — R29898 Other symptoms and signs involving the musculoskeletal system: Secondary | ICD-10-CM

## 2018-09-10 DIAGNOSIS — R55 Syncope and collapse: Secondary | ICD-10-CM | POA: Diagnosis not present

## 2018-09-10 DIAGNOSIS — M5136 Other intervertebral disc degeneration, lumbar region: Secondary | ICD-10-CM | POA: Diagnosis not present

## 2018-09-10 DIAGNOSIS — Z7951 Long term (current) use of inhaled steroids: Secondary | ICD-10-CM | POA: Insufficient documentation

## 2018-09-10 DIAGNOSIS — R29818 Other symptoms and signs involving the nervous system: Secondary | ICD-10-CM | POA: Diagnosis not present

## 2018-09-10 DIAGNOSIS — I700 Atherosclerosis of aorta: Secondary | ICD-10-CM | POA: Diagnosis not present

## 2018-09-10 DIAGNOSIS — I6523 Occlusion and stenosis of bilateral carotid arteries: Secondary | ICD-10-CM | POA: Diagnosis not present

## 2018-09-10 DIAGNOSIS — M549 Dorsalgia, unspecified: Secondary | ICD-10-CM | POA: Diagnosis not present

## 2018-09-10 DIAGNOSIS — R42 Dizziness and giddiness: Secondary | ICD-10-CM | POA: Diagnosis not present

## 2018-09-10 DIAGNOSIS — M1990 Unspecified osteoarthritis, unspecified site: Secondary | ICD-10-CM | POA: Insufficient documentation

## 2018-09-10 DIAGNOSIS — R7303 Prediabetes: Secondary | ICD-10-CM | POA: Insufficient documentation

## 2018-09-10 DIAGNOSIS — Z7982 Long term (current) use of aspirin: Secondary | ICD-10-CM | POA: Diagnosis not present

## 2018-09-10 DIAGNOSIS — I129 Hypertensive chronic kidney disease with stage 1 through stage 4 chronic kidney disease, or unspecified chronic kidney disease: Secondary | ICD-10-CM | POA: Diagnosis not present

## 2018-09-10 DIAGNOSIS — R202 Paresthesia of skin: Secondary | ICD-10-CM | POA: Diagnosis not present

## 2018-09-10 DIAGNOSIS — R200 Anesthesia of skin: Principal | ICD-10-CM | POA: Insufficient documentation

## 2018-09-10 DIAGNOSIS — N183 Chronic kidney disease, stage 3 (moderate): Secondary | ICD-10-CM | POA: Insufficient documentation

## 2018-09-10 DIAGNOSIS — M6281 Muscle weakness (generalized): Secondary | ICD-10-CM | POA: Diagnosis not present

## 2018-09-10 LAB — CBC
HCT: 36 % (ref 36.0–46.0)
HCT: 37.2 % (ref 36.0–46.0)
Hemoglobin: 11.3 g/dL — ABNORMAL LOW (ref 12.0–15.0)
Hemoglobin: 11.6 g/dL — ABNORMAL LOW (ref 12.0–15.0)
MCH: 27.9 pg (ref 26.0–34.0)
MCH: 27.8 pg (ref 26.0–34.0)
MCHC: 31.4 g/dL (ref 30.0–36.0)
MCHC: 31.2 g/dL (ref 30.0–36.0)
MCV: 88.9 fL (ref 80.0–100.0)
MCV: 89.2 fL (ref 80.0–100.0)
Platelets: 374 10*3/uL (ref 150–400)
Platelets: 384 10*3/uL (ref 150–400)
RBC: 4.05 MIL/uL (ref 3.87–5.11)
RBC: 4.17 MIL/uL (ref 3.87–5.11)
RDW: 14.2 % (ref 11.5–15.5)
RDW: 14.1 % (ref 11.5–15.5)
WBC: 9.3 10*3/uL (ref 4.0–10.5)
WBC: 7.4 10*3/uL (ref 4.0–10.5)
nRBC: 0 % (ref 0.0–0.2)
nRBC: 0 % (ref 0.0–0.2)

## 2018-09-10 LAB — SEDIMENTATION RATE: Sed Rate: 59 mm/hr — ABNORMAL HIGH (ref 0–30)

## 2018-09-10 LAB — APTT: aPTT: 30 seconds (ref 24–36)

## 2018-09-10 LAB — DIFFERENTIAL
Abs Immature Granulocytes: 0.01 10*3/uL (ref 0.00–0.07)
Basophils Absolute: 0.1 10*3/uL (ref 0.0–0.1)
Basophils Relative: 1 %
Eosinophils Absolute: 0.2 10*3/uL (ref 0.0–0.5)
Eosinophils Relative: 3 %
Immature Granulocytes: 0 %
Lymphocytes Relative: 48 %
Lymphs Abs: 4.5 10*3/uL — ABNORMAL HIGH (ref 0.7–4.0)
Monocytes Absolute: 0.7 10*3/uL (ref 0.1–1.0)
Monocytes Relative: 7 %
Neutro Abs: 3.8 10*3/uL (ref 1.7–7.7)
Neutrophils Relative %: 41 %

## 2018-09-10 LAB — BASIC METABOLIC PANEL
Anion gap: 5 (ref 5–15)
BUN: 23 mg/dL (ref 8–23)
CO2: 28 mmol/L (ref 22–32)
Calcium: 9.3 mg/dL (ref 8.9–10.3)
Chloride: 103 mmol/L (ref 98–111)
Creatinine, Ser: 0.97 mg/dL (ref 0.44–1.00)
GFR calc Af Amer: >60 mL/min (ref >60)
GFR calc non Af Amer: 58 mL/min — ABNORMAL LOW (ref >60)
Glucose, Bld: 130 mg/dL — ABNORMAL HIGH (ref 70–99)
Potassium: 3.9 mmol/L (ref 3.5–5.1)
Sodium: 136 mmol/L (ref 135–145)

## 2018-09-10 LAB — COMPREHENSIVE METABOLIC PANEL
ALT: 11 U/L (ref 0–44)
AST: 18 U/L (ref 15–41)
Albumin: 3.5 g/dL (ref 3.5–5.0)
Alkaline Phosphatase: 108 U/L (ref 38–126)
Anion gap: 6 (ref 5–15)
BUN: 25 mg/dL — ABNORMAL HIGH (ref 8–23)
CO2: 27 mmol/L (ref 22–32)
Calcium: 9.2 mg/dL (ref 8.9–10.3)
Chloride: 103 mmol/L (ref 98–111)
Creatinine, Ser: 1.05 mg/dL — ABNORMAL HIGH (ref 0.44–1.00)
GFR calc Af Amer: >60 mL/min (ref >60)
GFR calc non Af Amer: 53 mL/min — ABNORMAL LOW (ref >60)
Glucose, Bld: 121 mg/dL — ABNORMAL HIGH (ref 70–99)
Potassium: 3.8 mmol/L (ref 3.5–5.1)
Sodium: 136 mmol/L (ref 135–145)
Total Bilirubin: 0.3 mg/dL (ref 0.3–1.2)
Total Protein: 7.8 g/dL (ref 6.5–8.1)

## 2018-09-10 LAB — PROTIME-INR
INR: 0.9 (ref 0.8–1.2)
Prothrombin Time: 12 seconds (ref 11.4–15.2)

## 2018-09-10 LAB — GLUCOSE, CAPILLARY: Glucose-Capillary: 94 mg/dL (ref 70–99)

## 2018-09-10 LAB — SARS CORONAVIRUS 2 (TAT 6-24 HRS): SARS Coronavirus 2: NEGATIVE

## 2018-09-10 LAB — VITAMIN B12: Vitamin B-12: 256 pg/mL (ref 180–914)

## 2018-09-10 LAB — TSH: TSH: 2.434 u[IU]/mL (ref 0.350–4.500)

## 2018-09-10 MED ORDER — ENOXAPARIN SODIUM 40 MG/0.4ML ~~LOC~~ SOLN
40.0000 mg | Freq: Two times a day (BID) | SUBCUTANEOUS | Status: DC
  Administered 2018-09-10: 40 mg via SUBCUTANEOUS
  Filled 2018-09-10: qty 0.4

## 2018-09-10 MED ORDER — ENALAPRIL MALEATE 10 MG PO TABS
10.0000 mg | ORAL_TABLET | Freq: Every day | ORAL | Status: DC
  Filled 2018-09-10: qty 1

## 2018-09-10 MED ORDER — IOHEXOL 350 MG/ML SOLN
75.0000 mL | Freq: Once | INTRAVENOUS | Status: AC | PRN
  Administered 2018-09-10: 75 mL via INTRAVENOUS

## 2018-09-10 MED ORDER — ALLOPURINOL 300 MG PO TABS
150.0000 mg | ORAL_TABLET | Freq: Every day | ORAL | Status: DC
  Filled 2018-09-10: qty 0.5

## 2018-09-10 MED ORDER — PRAVASTATIN SODIUM 20 MG PO TABS: 40.0000 mg | ORAL_TABLET | Freq: Every day | ORAL | Status: DC

## 2018-09-10 MED ORDER — ASPIRIN EC 81 MG PO TBEC: 81.0000 mg | DELAYED_RELEASE_TABLET | Freq: Every day | ORAL | Status: DC

## 2018-09-10 MED ORDER — ONDANSETRON HCL 4 MG PO TABS: 4.0000 mg | ORAL_TABLET | Freq: Four times a day (QID) | ORAL | Status: DC | PRN

## 2018-09-10 MED ORDER — PANTOPRAZOLE SODIUM 40 MG PO TBEC: 40.0000 mg | DELAYED_RELEASE_TABLET | Freq: Every day | ORAL | Status: DC

## 2018-09-10 MED ORDER — GADOBUTROL 1 MMOL/ML IV SOLN
10.0000 mL | Freq: Once | INTRAVENOUS | Status: AC | PRN
  Administered 2018-09-10: 05:00:00 10 mL via INTRAVENOUS

## 2018-09-10 MED ORDER — HYDROCHLOROTHIAZIDE 12.5 MG PO CAPS: 12.5000 mg | ORAL_CAPSULE | Freq: Every day | ORAL | Status: DC

## 2018-09-10 MED ORDER — ONDANSETRON HCL 4 MG/2ML IJ SOLN: 4.0000 mg | Freq: Four times a day (QID) | INTRAMUSCULAR | Status: DC | PRN

## 2018-09-10 MED ORDER — MOMETASONE FURO-FORMOTEROL FUM 200-5 MCG/ACT IN AERO
2.0000 | INHALATION_SPRAY | Freq: Two times a day (BID) | RESPIRATORY_TRACT | Status: DC
  Filled 2018-09-10: qty 8.8

## 2018-09-10 MED ORDER — SODIUM CHLORIDE 0.9% FLUSH: 3.0000 mL | Freq: Once | INTRAVENOUS | Status: DC

## 2018-09-10 MED ORDER — SODIUM CHLORIDE 0.9% FLUSH
3.0000 mL | Freq: Two times a day (BID) | INTRAVENOUS | Status: DC
  Administered 2018-09-10: 06:00:00 3 mL via INTRAVENOUS

## 2018-09-10 MED ORDER — HYDROCODONE-ACETAMINOPHEN 5-325 MG PO TABS: 1.0000 | ORAL_TABLET | ORAL | Status: DC | PRN

## 2018-09-10 NOTE — Consult Note (Addendum)
Reason for Consult:Dizziness, BLE numbness Referring Physician: Posey Pronto  CC: Dizziness, BLE numbness  HPI: Colleen [name] is an 73 y.o. female with a history of spinal stenosis s/p surgical intervention in 1984 who on yesterday while in the bathroom had acute onset of dizziness which she describes as lightheadedness and noted that her legs became numb.  Reports numbness travelling up from both big toes to the knees.  Symptoms lasted about 20 minutes and resolved.  Patient presented for evaluation.    Past Medical History:  Diagnosis Date  . Asthma   . Cataract   . COPD (chronic obstructive pulmonary disease) ([place])   . GERD (gastroesophageal reflux disease)   . Gout   . Hyperlipidemia   . Hypertension   . Osteoarthrosis   . Prediabetes     Past Surgical History:  Procedure Laterality Date  . BREAST EXCISIONAL BIOPSY Left 04/08/2014   Intraductal papilloma-5 mm  . LUMBAR LAMINECTOMY    . TONSILLECTOMY      Family History  Problem Relation Age of Onset  . Diabetes Mother   . Lung cancer Father   . Diabetes Sister   . Congestive Heart Failure Sister   . Glaucoma Sister   . Breast cancer Neg Hx     Social History:  reports that she quit smoking about 9 years ago. Her smoking use included cigarettes. She has a 30.00 pack-year smoking history. She has never used smokeless tobacco. She reports previous alcohol use of about 1.0 standard drinks of alcohol per week. She reports that she does not use drugs.  No Known Allergies  Medications:  I have reviewed the patient's current medications. Prior to Admission:  Medications Prior to Admission  Medication Sig Dispense Refill Last Dose  . allopurinol (ZYLOPRIM) 100 MG tablet Take 1.5 tablets (150 mg total) by mouth daily. 135 tablet 1 unknown at unknown  . aspirin EC 81 MG tablet Take 81 mg by mouth daily.   unknown at unknown  . enalapril (VASOTEC) 10 MG tablet Take 1 tablet (10 mg total) by mouth daily. 90 tablet 1 unknown at  unknown  . famotidine (PEPCID) 20 MG tablet Take 1 tablet (20 mg total) by mouth at bedtime. This is to replace protonix. 90 tablet 1 unknown at unknown  . hydrochlorothiazide (MICROZIDE) 12.5 MG capsule Take 1 capsule (12.5 mg total) by mouth daily. 90 capsule 1 unknowbn at unknown  . lovastatin (MEVACOR) 40 MG tablet Take 1 tablet (40 mg total) by mouth at bedtime. 90 tablet 1 unknown at unknown  . PROAIR HFA 108 (90 Base) MCG/ACT inhaler INHALE 2 PUFFS BY MOUTH EVERY 4 HOURS AS NEEDED FOR WHEEZING FOR SHORTNESS OF BREATH 18 g 0 prn at prn  . simethicone (GAS-X) 80 MG chewable tablet Chew 1 tablet (80 mg total) by mouth 2 (two) times a day. 60 tablet 2 unknown at unknown  . SYMBICORT 160-4.5 MCG/ACT inhaler Inhale 2 puffs into the lungs 2 (two) times daily. 33 g 0 unknown at unknown  . triamcinolone cream (KENALOG) 0.1 % APPLY CREAM EXTERNALLY TO AFFECTED AREA TWICE DAILY if needed; not more than one week at a time, then take a break for two weeks 80 g 0 prn at prn  . Vitamin D, Cholecalciferol, 25 MCG (1000 UT) TABS Take 1 tablet by mouth daily.    unknown at unknown  . Blood Pressure Monitoring (BLOOD PRESSURE KIT) DEVI 1 Device by Does not apply route daily. Fluctuating blood pressure; please get appropriate size, large  cuff 1 Device 0   . pantoprazole (PROTONIX) 40 MG tablet Take 1 tablet (40 mg total) by mouth daily. (Patient not taking: Reported on 09/10/2018) 30 tablet 2 Not Taking at unknown   Scheduled: . allopurinol  150 mg Oral Daily  . aspirin EC  81 mg Oral Daily  . enalapril  10 mg Oral Daily  . enoxaparin (LOVENOX) injection  40 mg Subcutaneous BID  . hydrochlorothiazide  12.5 mg Oral Daily  . mometasone-formoterol  2 puff Inhalation BID  . pantoprazole  40 mg Oral Daily  . pravastatin  40 mg Oral q1800  . sodium chloride flush  3 mL Intravenous Once  . sodium chloride flush  3 mL Intravenous Q12H    ROS: History obtained from the patient  General ROS: negative for -  chills, fatigue, fever, night sweats, weight gain or weight loss Psychological ROS: negative for - behavioral disorder, hallucinations, memory difficulties, mood swings or suicidal ideation Ophthalmic ROS: negative for - blurry vision, double vision, eye pain or loss of vision ENT ROS: negative for - epistaxis, nasal discharge, oral lesions, sore throat, tinnitus or vertigo Allergy and Immunology ROS: negative for - hives or itchy/watery eyes Hematological and Lymphatic ROS: negative for - bleeding problems, bruising or swollen lymph nodes Endocrine ROS: negative for - galactorrhea, hair pattern changes, polydipsia/polyuria or temperature intolerance Respiratory ROS: negative for - cough, hemoptysis, shortness of breath or wheezing Cardiovascular ROS: negative for - chest pain, dyspnea on exertion, edema or irregular heartbeat Gastrointestinal ROS: negative for - abdominal pain, diarrhea, hematemesis, nausea/vomiting or stool incontinence Genito-Urinary ROS: negative for - dysuria, hematuria, incontinence or urinary frequency/urgency Musculoskeletal ROS: right leg pain Neurological ROS: as noted in HPI Dermatological ROS: negative for rash and skin lesion changes  Physical Examination: Blood pressure 129/66, pulse 79, temperature 98.5 F (36.9 C), resp. rate 16, height '5\' 6"'  (1.676 m), weight 125.2 kg, SpO2 100 %.  HEENT-  Normocephalic, no lesions, without obvious abnormality.  Normal external eye and conjunctiva.  Normal TM's bilaterally.  Normal auditory canals and external ears. Normal external nose, mucus membranes and septum.  Normal pharynx. Cardiovascular- S1, S2 normal, pulses palpable throughout   Lungs- chest clear, no wheezing, rales, normal symmetric air entry Abdomen- soft, non-tender; bowel sounds normal; no masses,  no organomegaly Extremities- no edema Lymph-no adenopathy palpable Musculoskeletal-no joint tenderness, deformity or swelling Skin-warm and dry, no  hyperpigmentation, vitiligo, or suspicious lesions  Neurological Examination   Mental Status: Alert, oriented, thought content appropriate.  Speech fluent without evidence of aphasia.  Able to follow 3 step commands without difficulty. Cranial Nerves: II: Discs flat bilaterally; Visual fields grossly normal, pupils equal, round, reactive to light and accommodation III,IV, VI: ptosis not present, extra-ocular motions intact bilaterally V,VII: smile symmetric, facial light touch sensation normal bilaterally VIII: hearing normal bilaterally IX,X: gag reflex present XI: bilateral shoulder shrug XII: midline tongue extension Motor: Right : Upper extremity   5/5    Left:     Upper extremity   5/5  Lower extremity   5-/5     Lower extremity   5/5 Tone and bulk:normal tone throughout; no atrophy noted Sensory: Pinprick and light touch intact throughout, bilaterally Deep Tendon Reflexes: Symmetric throughout Plantars: Right: mute   Left: mute Cerebellar: Normal finger-to-nose and normal heel-to-shin testing bilaterally Gait: not tested due to safety concerns    Laboratory Studies:   Basic Metabolic Panel: Recent Labs  Lab 09/05/18 0919 09/10/18 0125 09/10/18 0507  NA 138 136  136  K 4.1 3.8 3.9  CL 102 103 103  CO2 '29 27 28  ' GLUCOSE 107* 121* 130*  BUN 27* 25* 23  CREATININE 1.16* 1.05* 0.97  CALCIUM 9.8 9.2 9.3    Liver Function Tests: Recent Labs  Lab 09/05/18 0919 09/10/18 0125  AST 16 18  ALT 9 11  ALKPHOS  --  108  BILITOT 0.4 0.3  PROT 7.7 7.8  ALBUMIN  --  3.5   No results for input(s): LIPASE, AMYLASE in the last 168 hours. No results for input(s): AMMONIA in the last 168 hours.  CBC: Recent Labs  Lab 09/10/18 0125 09/10/18 0507  WBC 9.3 7.4  NEUTROABS 3.8  --   HGB 11.3* 11.6*  HCT 36.0 37.2  MCV 88.9 89.2  PLT 374 384    Cardiac Enzymes: No results for input(s): CKTOTAL, CKMB, CKMBINDEX, TROPONINI in the last 168 hours.  BNP: Invalid  input(s): POCBNP  CBG: Recent Labs  Lab 09/10/18 0119  GLUCAP 80    Microbiology: No results found for this or any previous visit.  Coagulation Studies: Recent Labs    09/10/18 0125  LABPROT 12.0  INR 0.9    Urinalysis: No results for input(s): COLORURINE, LABSPEC, PHURINE, GLUCOSEU, HGBUR, BILIRUBINUR, KETONESUR, PROTEINUR, UROBILINOGEN, NITRITE, LEUKOCYTESUR in the last 168 hours.  Invalid input(s): APPERANCEUR  Lipid Panel:     Component Value Date/Time   CHOL 175 09/05/2018 0919   CHOL 161 07/30/2014 0830   TRIG 69 09/05/2018 0919   HDL 70 09/05/2018 0919   HDL 82 07/30/2014 0830   CHOLHDL 2.5 09/05/2018 0919   VLDL 15 12/31/2015 0922   LDLCALC 89 09/05/2018 0919    HgbA1C:  Lab Results  Component Value Date   HGBA1C 6.2 (H) 09/05/2018    Urine Drug Screen:  No results found for: LABOPIA, COCAINSCRNUR, LABBENZ, AMPHETMU, THCU, LABBARB  Alcohol Level: No results for input(s): ETH in the last 168 hours.  Other results: EKG: sinus rhythm at 80 bpm.  Imaging: Mr Lumbar Spine W Wo Contrast  Result Date: 09/10/2018 CLINICAL DATA:  Bilateral foot paresthesias and numbness. EXAM: MRI LUMBAR SPINE WITHOUT AND WITH CONTRAST TECHNIQUE: Multiplanar and multiecho pulse sequences of the lumbar spine were obtained without and with intravenous contrast. CONTRAST:  10 mL Gadavist COMPARISON:  None. FINDINGS: Segmentation:  Normal Alignment:  Normal Vertebrae:  No fracture, evidence of discitis, or bone lesion. Conus medullaris and cauda equina: Conus extends to the L1 level. Conus and cauda equina appear normal. Paraspinal and other soft tissues: Negative Disc levels: T11-12: Normal. T12-L1: Mild disc bulge without spinal canal or neural foraminal stenosis. L1-2: Disc space narrowing with moderate spinal canal stenosis. There is moderate right and severe left neural foraminal stenosis. L2-3: Intermediate disc bulge with severe spinal canal stenosis. Moderate right and mild left  neural foraminal stenosis. L3-4: Intermediate disc bulge with mild spinal canal stenosis. Moderate right and mild left neural foraminal stenosis. Mild facet hypertrophy. L4-5: Remote right hemilaminectomy. Moderate bilateral facet hypertrophy. Mild disc bulge with endplate spurring. No central spinal canal stenosis. Mild right and moderate left neural foraminal stenosis. No abnormal contrast enhancement. L5-S1: Remote right laminectomy. No spinal canal stenosis. Intermediate disc bulge with central annular fissure. Moderate right and severe left neural foraminal stenosis. No abnormal contrast enhancement. IMPRESSION: 1. Multilevel lumbar degenerative disc disease with severe L2-3 and moderate L1-2 spinal canal stenosis. 2. Postsurgical changes at L4-5 and L5-S1 without residual spinal canal stenosis but there is moderate-to-severe neural foraminal  stenosis at both levels. 3. Moderate-to-severe neural foraminal stenosis at bilateral L1-2, right L2-3, right L3-4, left L4-5 and bilateral L5-S1. Electronically Signed   By: Ulyses Jarred M.D.   On: 09/10/2018 04:57   Ct Head Code Stroke Wo Contrast  Result Date: 09/10/2018 CLINICAL DATA:  Code stroke.  Acute onset weakness. EXAM: CT HEAD WITHOUT CONTRAST TECHNIQUE: Contiguous axial images were obtained from the base of the skull through the vertex without intravenous contrast. COMPARISON:  12/31/2014 FINDINGS: Brain: There is no mass, hemorrhage or extra-axial collection. The size and configuration of the ventricles and extra-axial CSF spaces are normal. The brain parenchyma is normal, without evidence of acute or chronic infarction. Vascular: No abnormal hyperdensity of the major intracranial arteries or dural venous sinuses. No intracranial atherosclerosis. Skull: The visualized skull base, calvarium and extracranial soft tissues are normal. Sinuses/Orbits: No fluid levels or advanced mucosal thickening of the visualized paranasal sinuses. No mastoid or middle ear  effusion. The orbits are normal. ASPECTS [facility] Stroke Program Early CT Score) - Ganglionic level infarction (caudate, lentiform nuclei, internal capsule, insula, M1-M3 cortex): 7 - Supraganglionic infarction (M4-M6 cortex): 3 Total score (0-10 with 10 being normal): 10 IMPRESSION: 1. Normal aging brain. 2. ASPECTS is ten. These results were called by telephone at the time of interpretation on 09/10/2018 at 1:39 am to Dr. Hinda Kehr , who verbally acknowledged these results. Electronically Signed   By: Ulyses Jarred M.D.   On: 09/10/2018 01:39     Assessment/Plan: 73 year old female presenting after an episode of lightheadedness and BLE numbness.  Patient at baseline today.  Head CT reviewed and unremarkable.  MRI of the lumbar spine shows multiple levels of degenerative changes and neuroforaminal stenosis as well as severe spinal stenosis at L2-3.   ESR 59. Although degenerative changes may very contribute to lower extremity numbness, would not typically cause lightheadedness.  Further work up recommended.    Recommendations: 1. Would perform CTA of the head and neck to rule out vertebrobasilar thrombosis/dissection.  If unremarkable no further imaging indicated at this time.   2. Orthostatic vitals 3. Telemetry 4. If above unremarkable patient may follow up on an outpatient basis.  Agree with continued ASA daily.    Alexis Goodell, MD Neurology [phone] 09/10/2018, 9:20 AM

## 2018-09-10 NOTE — Care Management Obs Status (Signed)
[place] NOTIFICATION   Patient Details  Name: Colleen [name] MRN: [id number] Date of Birth: 09/12/1945   Medicare Observation Status Notification Given:  Yes    [name] A Halo Shevlin, RN 09/10/2018, 9:42 AM

## 2018-09-10 NOTE — ED Provider Notes (Signed)
[facility] Emergency Department Provider Note  ____________________________________________   First MD Initiated Contact with Patient 09/10/18 0132     (approximate)  I have reviewed the triage vital signs and the nursing notes.   HISTORY  Chief Complaint Dizziness  Level 5 caveat:  history/ROS limited by acute/critical illness  HPI Colleen [name] is a 73 y.o. female with medical issues as listed below who presents by private vehicle for evaluation of acute onset bilateral leg numbness and weakness.  She states that she got up to go to the bathroom.  She was not yet to the bathroom when she had acute onset of feeling like she might pass out with acute onset of numbness and tingling as well as weakness.  She says that she had a hard time getting to the toilet.  She sat down and rested for a little bit and the feelings did not go away.  She called her sister and asked her to come bring her to the emergency department.  By the time she got here, she was feeling much better and no longer feels the symptoms in her legs.  She has some issues with chronic back pain and has had prior surgeries but she is not having any acute back pain.  She has not been in contact with COVID-19 patients.  She denies fever, sore throat, chest pain, shortness of breath, cough, nausea, vomiting, abdominal pain, and dysuria.  She has no history of stroke and takes a daily baby aspirin but no other anticoagulation.       Past Medical History:  Diagnosis Date   Asthma    Cataract    COPD (chronic obstructive pulmonary disease) ([place])    GERD (gastroesophageal reflux disease)    Gout    Hyperlipidemia    Hypertension    Osteoarthrosis    Prediabetes     Patient Active Problem List   Diagnosis Date Noted   Near syncope 09/10/2018   Aortic atherosclerosis ([place]) 09/05/2018   At high risk for falls 09/05/2018   Morbid obesity with BMI of 45.0-49.9, adult ([place]) 08/29/2017     Osteopenia 05/18/2017   Chronic kidney disease, stage III (moderate) ([place]) 04/21/2017   Morbid obesity ([place]) 04/21/2017   Acquired trigger finger 02/28/2017   Chronic gouty arthropathy without tophi 11/29/2016   Hypergammaglobulinemia 11/29/2016   Osteoarthritis of both knees 11/29/2016   Allergic rhinitis, seasonal 07/30/2014   HLD (hyperlipidemia) 07/30/2014   Osteoarthritis of knee 07/30/2014   Prediabetes 07/30/2014   Asthma, mild persistent 07/30/2014   COPD (chronic obstructive pulmonary disease) ([place]) 07/09/2008   Benign essential HTN 06/22/2006    Past Surgical History:  Procedure Laterality Date   BREAST EXCISIONAL BIOPSY Left 04/08/2014   Intraductal papilloma-5 mm   LUMBAR LAMINECTOMY     TONSILLECTOMY      Prior to Admission medications   Medication Sig Start Date End Date Taking? Authorizing Provider  allopurinol (ZYLOPRIM) 100 MG tablet Take 1.5 tablets (150 mg total) by mouth daily. 09/05/18 12/04/18 Yes Poulose, Bethel Born, NP  aspirin EC 81 MG tablet Take 81 mg by mouth daily.   Yes [provider]  enalapril (VASOTEC) 10 MG tablet Take 1 tablet (10 mg total) by mouth daily. 09/05/18  Yes Poulose, Bethel Born, NP  famotidine (PEPCID) 20 MG tablet Take 1 tablet (20 mg total) by mouth at bedtime. This is to replace protonix. 09/05/18  Yes Poulose, Bethel Born, NP  hydrochlorothiazide (MICROZIDE) 12.5 MG capsule Take 1  capsule (12.5 mg total) by mouth daily. 09/05/18  Yes Poulose, Bethel Born, NP  lovastatin (MEVACOR) 40 MG tablet Take 1 tablet (40 mg total) by mouth at bedtime. 09/05/18  Yes Poulose, Bethel Born, NP  PROAIR HFA 108 (90 Base) MCG/ACT inhaler INHALE 2 PUFFS BY MOUTH EVERY 4 HOURS AS NEEDED FOR WHEEZING FOR SHORTNESS OF BREATH 09/06/18  Yes Delsa Grana, PA-C  simethicone (GAS-X) 80 MG chewable tablet Chew 1 tablet (80 mg total) by mouth 2 (two) times a day. 08/02/18 10/31/18 Yes Paduchowski, Lennette Bihari, MD  SYMBICORT 160-4.5 MCG/ACT  inhaler Inhale 2 puffs into the lungs 2 (two) times daily. 09/05/18  Yes Poulose, Bethel Born, NP  triamcinolone cream (KENALOG) 0.1 % APPLY CREAM EXTERNALLY TO AFFECTED AREA TWICE DAILY if needed; not more than one week at a time, then take a break for two weeks 05/30/18  Yes Hubbard Hartshorn, FNP  Vitamin D, Cholecalciferol, 25 MCG (1000 UT) TABS Take 1 tablet by mouth daily.    Yes [provider]  Blood Pressure Monitoring (BLOOD PRESSURE KIT) DEVI 1 Device by Does not apply route daily. Fluctuating blood pressure; please get appropriate size, large cuff 02/03/18   Lada, Satira Anis, MD  pantoprazole (PROTONIX) 40 MG tablet Take 1 tablet (40 mg total) by mouth daily. Patient not taking: Reported on 09/10/2018 08/02/18 10/31/18  Harvest Dark, MD    Allergies Patient has no known allergies.  Family History  Problem Relation Age of Onset   Diabetes Mother    Lung cancer Father    Diabetes Sister    Congestive Heart Failure Sister    Glaucoma Sister    Breast cancer Neg Hx     Social History Social History   Tobacco Use   Smoking status: Former Smoker    Packs/day: 1.50    Years: 20.00    Pack years: 30.00    Types: Cigarettes    Quit date: 11/13/2008    Years since quitting: 9.8   Smokeless tobacco: Never Used   Tobacco comment: smoking cessation materials not required  Substance Use Topics   Alcohol use: Not Currently    Alcohol/week: 1.0 standard drinks    Types: 1 Cans of beer per week   Drug use: No    Review of Systems Level 5 caveat:  history/ROS limited by acute/critical illness   Constitutional: No fever/chills Eyes: No visual changes. ENT: No sore throat. Cardiovascular: Denies chest pain.  Brief episode of near syncope, resolved. Respiratory: Denies shortness of breath. Gastrointestinal: No abdominal pain.  No nausea, no vomiting.  No diarrhea.  No constipation. Genitourinary: Negative for dysuria. Musculoskeletal: Negative for neck pain.   Negative for back pain. Integumentary: Negative for rash. Neurological: Acute onset numbness and weakness of lower extremities as described above.   ____________________________________________   PHYSICAL EXAM:  VITAL SIGNS: ED Triage Vitals  Enc Vitals Group     BP 09/10/18 0200 137/65     Pulse Rate 09/10/18 0117 77     Resp 09/10/18 0117 20     Temp 09/10/18 0117 98.4 F (36.9 C)     Temp Source 09/10/18 0117 Oral     SpO2 09/10/18 0117 99 %     Weight 09/10/18 0118 125.2 kg (276 lb)     Height 09/10/18 0118 1.676 m (_0 )     Head Circumference --      Peak Flow --      Pain Score 09/10/18 0118 0     Pain Loc --  Pain Edu? --      Excl. in [place]? --     Constitutional: Alert and oriented.  No acute distress, appropriately interactive. Eyes: Conjunctivae are normal.  Head: Atraumatic. Nose: No congestion/rhinnorhea. Mouth/Throat: Mucous membranes are moist. Neck: No stridor.  No meningeal signs.   Cardiovascular: Normal rate, regular rhythm. Good peripheral circulation. Grossly normal heart sounds. Respiratory: Normal respiratory effort.  No retractions. Gastrointestinal: Soft and nontender. No distention.  Musculoskeletal: No lower extremity tenderness nor edema. No gross deformities of extremities. Neurologic:  Normal speech and language. No gross focal neurologic deficits are appreciated.  NIH stroke scale is 0. Skin:  Skin is warm, dry and intact. Psychiatric: Mood and affect are normal. Speech and behavior are normal.  ____________________________________________   LABS (all labs ordered are listed, but only abnormal results are displayed)  Labs Reviewed  CBC - Abnormal; Notable for the following components:      Result Value   Hemoglobin 11.3 (*)    All other components within normal limits  DIFFERENTIAL - Abnormal; Notable for the following components:   Lymphs Abs 4.5 (*)    All other components within normal limits  COMPREHENSIVE METABOLIC PANEL  - Abnormal; Notable for the following components:   Glucose, Bld 121 (*)    BUN 25 (*)    Creatinine, Ser 1.05 (*)    GFR calc non Af Amer 53 (*)    All other components within normal limits  SEDIMENTATION RATE - Abnormal; Notable for the following components:   Sed Rate 59 (*)    All other components within normal limits  SARS CORONAVIRUS 2  GLUCOSE, CAPILLARY  PROTIME-INR  APTT  TSH  VITAMIN B12  ANTINUCLEAR ANTIBODIES, IFA  BASIC METABOLIC PANEL  CBC  I-STAT CREATININE, ED  CBG MONITORING, ED   ____________________________________________  EKG  ED ECG REPORT I, Hinda Kehr, the attending physician, personally viewed and interpreted this ECG.  Date: 09/10/2018 EKG Time: 1:35 AM Rate: 80 Rhythm: normal sinus rhythm QRS Axis: normal Intervals: normal ST/T Wave abnormalities: normal Narrative Interpretation: no evidence of acute ischemia  ____________________________________________  RADIOLOGY I, Hinda Kehr, personally discussed these images and results by phone with the on-call radiologist and used this discussion as part of my medical decision making.    ED MD interpretation:  No evidence of acute infarction on head CT   Official radiology report(s): Ct Head Code Stroke Wo Contrast  Result Date: 09/10/2018 CLINICAL DATA:  Code stroke.  Acute onset weakness. EXAM: CT HEAD WITHOUT CONTRAST TECHNIQUE: Contiguous axial images were obtained from the base of the skull through the vertex without intravenous contrast. COMPARISON:  12/31/2014 FINDINGS: Brain: There is no mass, hemorrhage or extra-axial collection. The size and configuration of the ventricles and extra-axial CSF spaces are normal. The brain parenchyma is normal, without evidence of acute or chronic infarction. Vascular: No abnormal hyperdensity of the major intracranial arteries or dural venous sinuses. No intracranial atherosclerosis. Skull: The visualized skull base, calvarium and extracranial soft  tissues are normal. Sinuses/Orbits: No fluid levels or advanced mucosal thickening of the visualized paranasal sinuses. No mastoid or middle ear effusion. The orbits are normal. ASPECTS [facility] Stroke Program Early CT Score) - Ganglionic level infarction (caudate, lentiform nuclei, internal capsule, insula, M1-M3 cortex): 7 - Supraganglionic infarction (M4-M6 cortex): 3 Total score (0-10 with 10 being normal): 10 IMPRESSION: 1. Normal aging brain. 2. ASPECTS is ten. These results were called by telephone at the time of interpretation on 09/10/2018 at 1:39 am to  Dr. Hinda Kehr , who verbally acknowledged these results. Electronically Signed   By: Ulyses Jarred M.D.   On: 09/10/2018 01:39    ____________________________________________   PROCEDURES   Procedure(s) performed (including Critical Care):  .Critical Care Performed by: Hinda Kehr, MD Authorized by: Hinda Kehr, MD   Critical care provider statement:    Critical care time (minutes):  30   Critical care time was exclusive of:  Separately billable procedures and treating other patients   Critical care was necessary to treat or prevent imminent or life-threatening deterioration of the following conditions:  CNS failure or compromise   Critical care was time spent personally by me on the following activities:  Development of treatment plan with patient or surrogate, discussions with consultants, evaluation of patient's response to treatment, examination of patient, obtaining history from patient or surrogate, ordering and performing treatments and interventions, ordering and review of laboratory studies, ordering and review of radiographic studies, pulse oximetry, re-evaluation of patient's condition and review of old charts     ____________________________________________   [place] / MDM / [place] / ED COURSE  As part of my medical decision making, I reviewed the following data within the [place] notes reviewed and incorporated, Labs reviewed , EKG interpreted , Old chart reviewed, Discussed with admitting physician Gardiner Barefoot), Discussed with radiologist, A consult was requested and obtained from this/these consultant(s) Neurology and Notes from prior ED visits   Differential diagnosis includes, but is not limited to, TIA/CVA, lower spinal cord compression, sciatica, osteomyelitis, transverse myelitis, discitis, epidural abscess.  The patient is well-appearing and in no distress with no evidence of any infectious disease symptoms.  No concern for COVID-19.  The acute onset of the symptoms concerned the patient greatly but she has resolved and has an NIH stroke scale of 0.  I do not believe she is a candidate for TPA.  However she came in complaining of acute onset neurological symptoms that could be attributable to an acute neurological emergency including stroke and the best way to get her evaluated by a specialist as soon as possible is to move forward with code stroke activation.  I authorized this while caring for another critically ill patient in the emergency department and the patient was taken emergently to head CT and will be evaluated by tele-neurology.  Labs are pending.  The patient does not need an emergent intervention at this time.      Clinical Course as of Sep 10 423  Sun Sep 10, 2018  0145 Spoke by phone with Dr. Collins Scotland with radiology.  No evidence of acute abnormality on code stroke head CT.   [CF]  2841 Discussed case by phone with teleneurology.  Awaiting written report.  Neurologist agrees that the patient is not a candidate for TPA and that brain ischemia is unlikely.   [CF]  0307 Discussed case by phone with Gardiner Barefoot of the hospitalist service who will admit.  We discussed the case including the tele-neurology evaluation and I am deferring to her regarding advanced MR imaging, but at this point I do not feel that emergent imaging in the  ED is necessary and I recommended that Colleen [name] consider deferring additional imaging to in person neurology evaluation tomorrow.   [CF]    Clinical Course User Index [CF] Hinda Kehr, MD     ____________________________________________  FINAL CLINICAL IMPRESSION(S) / ED DIAGNOSES  Final diagnoses:  Bilateral leg numbness  Bilateral leg weakness  Near  syncope     MEDICATIONS GIVEN DURING THIS VISIT:  Medications  sodium chloride flush (NS) 0.9 % injection 3 mL ( Intravenous Canceled Entry 09/10/18 0141)  allopurinol (ZYLOPRIM) tablet 150 mg (has no administration in time range)  enalapril (VASOTEC) tablet 10 mg (has no administration in time range)  hydrochlorothiazide (MICROZIDE) capsule 12.5 mg (has no administration in time range)  pravastatin (PRAVACHOL) tablet 40 mg (has no administration in time range)  pantoprazole (PROTONIX) EC tablet 40 mg (has no administration in time range)  mometasone-formoterol (DULERA) 200-5 MCG/ACT inhaler 2 puff (has no administration in time range)  aspirin EC tablet 81 mg (has no administration in time range)  enoxaparin (LOVENOX) injection 40 mg (has no administration in time range)  sodium chloride flush (NS) 0.9 % injection 3 mL (has no administration in time range)  HYDROcodone-acetaminophen (NORCO/VICODIN) 5-325 MG per tablet 1-2 tablet (has no administration in time range)  ondansetron (ZOFRAN) tablet 4 mg (has no administration in time range)    Or  ondansetron (ZOFRAN) injection 4 mg (has no administration in time range)  gadobutrol (GADAVIST) 1 MMOL/ML injection 10 mL (has no administration in time range)     ED Discharge Orders    None      *Please note:  Colleen [name] was evaluated in Emergency Department on 09/10/2018 for the symptoms described in the history of present illness. She was evaluated in the context of the global COVID-19 pandemic, which necessitated consideration that the patient might be at risk for infection  with the SARS-CoV-2 virus that causes COVID-19. Institutional protocols and algorithms that pertain to the evaluation of patients at risk for COVID-19 are in a state of rapid change based on information released by regulatory bodies including the CDC and federal and state organizations. These policies and algorithms were followed during the patient's care in the ED.  Some ED evaluations and interventions may be delayed as a result of limited staffing during the pandemic.*  Note:  This document was prepared using Dragon voice recognition software and may include unintentional dictation errors.   Hinda Kehr, MD 09/10/18 [phone]

## 2018-09-10 NOTE — ED Notes (Signed)
Patient transported to MRI 

## 2018-09-10 NOTE — H&P (Signed)
[facility] [place] NAME: Colleen [name]    MR#:  [id number]  DATE OF BIRTH:  05-18-1945  DATE OF ADMISSION:  09/10/2018  PRIMARY CARE PHYSICIAN: Delsa Grana, PA-C   REQUESTING/REFERRING PHYSICIAN: Les Pou, MD  CHIEF COMPLAINT:   Chief Complaint  Patient presents with  . Dizziness    HISTORY OF PRESENT ILLNESS:  Colleen [name]  is a 73 y.o. female with a known history of asthma, COPD, GERD, gout, hyperlipidemia, hypertension.  She was brought to the emergency room by her sister after having an episode of dizziness/lightheadedness followed by numbness and tingling of her bilateral lower extremities after sitting on the toilet.  The bilateral lower extremity paresthesias lasted approximately 20 minutes.  The symptoms occurred around 00 30.  She denies bowel or bladder incontinence.  At the time that I am seeing her, she denies numbness or tingling of extremities.  She is no longer experiencing lightheadedness.  She denies unilateral weakness.  No diplopia or blurred vision.  She denies slurred speech or aphasia.  She denies chest pain or abdominal pain.  She denies shortness of breath.  She denies recent illness.  No fevers, chills, nausea, vomiting, diarrhea.  She is a history of chronic back pain and lumbar spine surgery.  However she reports back pain is currently stable.  CT brain was negative.  Tele-neurology was consulted by the ED physician recommending MRI of the lumbar spine with and without contrast to rule out any acute lumbar spine process or inflammatory process.  Also recommended inpatient neurology consult for evaluation and management.  She has been admitted to the hospitalist service for further management.  PAST MEDICAL HISTORY:   Past Medical History:  Diagnosis Date  . Asthma   . Cataract   . COPD (chronic obstructive pulmonary disease) ([place])   . GERD (gastroesophageal reflux disease)   . Gout   . Hyperlipidemia    . Hypertension   . Osteoarthrosis   . Prediabetes     PAST SURGICAL HISTORY:   Past Surgical History:  Procedure Laterality Date  . BREAST EXCISIONAL BIOPSY Left 04/08/2014   Intraductal papilloma-5 mm  . LUMBAR LAMINECTOMY    . TONSILLECTOMY      SOCIAL HISTORY:   Social History   Tobacco Use  . Smoking status: Former Smoker    Packs/day: 1.50    Years: 20.00    Pack years: 30.00    Types: Cigarettes    Quit date: 11/13/2008    Years since quitting: 9.8  . Smokeless tobacco: Never Used  . Tobacco comment: smoking cessation materials not required  Substance Use Topics  . Alcohol use: Not Currently    Alcohol/week: 1.0 standard drinks    Types: 1 Cans of beer per week    FAMILY HISTORY:   Family History  Problem Relation Age of Onset  . Diabetes Mother   . Lung cancer Father   . Diabetes Sister   . Congestive Heart Failure Sister   . Glaucoma Sister   . Breast cancer Neg Hx     DRUG ALLERGIES:  No Known Allergies  REVIEW OF SYSTEMS:   Review of Systems  Constitutional: Negative for chills, fever and malaise/fatigue.  HENT: Negative for congestion, sinus pain and sore throat.   Eyes: Negative for blurred vision and double vision.  Respiratory: Negative for cough, shortness of breath and wheezing.   Cardiovascular: Negative for chest pain, palpitations and leg swelling.  Gastrointestinal: Negative for  abdominal pain, constipation, diarrhea, heartburn, nausea and vomiting.  Genitourinary: Negative for dysuria, flank pain, frequency and hematuria.  Musculoskeletal: Negative for falls and myalgias.  Neurological: Positive for dizziness, tingling, sensory change and weakness (bil lower extremities). Negative for loss of consciousness and headaches.  Psychiatric/Behavioral: Negative for depression.   MEDICATIONS AT HOME:   Prior to Admission medications   Medication Sig Start Date End Date Taking? Authorizing Provider  allopurinol (ZYLOPRIM) 100 MG tablet  Take 1.5 tablets (150 mg total) by mouth daily. 09/05/18 12/04/18  Poulose, Bethel Born, NP  aspirin EC 81 MG tablet Take 81 mg by mouth daily.    [provider]  Blood Pressure Monitoring (BLOOD PRESSURE KIT) DEVI 1 Device by Does not apply route daily. Fluctuating blood pressure; please get appropriate size, large cuff 02/03/18   Lada, Satira Anis, MD  enalapril (VASOTEC) 10 MG tablet Take 1 tablet (10 mg total) by mouth daily. 09/05/18   Poulose, Bethel Born, NP  famotidine (PEPCID) 20 MG tablet Take 1 tablet (20 mg total) by mouth at bedtime. This is to replace protonix. 09/05/18   Poulose, Bethel Born, NP  hydrochlorothiazide (MICROZIDE) 12.5 MG capsule Take 1 capsule (12.5 mg total) by mouth daily. 09/05/18   Poulose, Bethel Born, NP  lovastatin (MEVACOR) 40 MG tablet Take 1 tablet (40 mg total) by mouth at bedtime. 09/05/18   Poulose, Bethel Born, NP  pantoprazole (PROTONIX) 40 MG tablet Take 1 tablet (40 mg total) by mouth daily. 08/02/18 10/31/18  Harvest Dark, MD  PROAIR HFA 108 [phone] Base) MCG/ACT inhaler INHALE 2 PUFFS BY MOUTH EVERY 4 HOURS AS NEEDED FOR WHEEZING FOR SHORTNESS OF BREATH 09/06/18   Delsa Grana, PA-C  simethicone (GAS-X) 80 MG chewable tablet Chew 1 tablet (80 mg total) by mouth 2 (two) times a day. 08/02/18 10/31/18  Harvest Dark, MD  SYMBICORT 160-4.5 MCG/ACT inhaler Inhale 2 puffs into the lungs 2 (two) times daily. 09/05/18   Poulose, Bethel Born, NP  triamcinolone cream (KENALOG) 0.1 % APPLY CREAM EXTERNALLY TO AFFECTED AREA TWICE DAILY if needed; not more than one week at a time, then take a break for two weeks 05/30/18   Hubbard Hartshorn, FNP  Vitamin D, Cholecalciferol, 25 MCG (1000 UT) TABS Take by mouth.    [provider]      VITAL SIGNS:  Blood pressure 122/68, pulse 73, temperature 98.4 F (36.9 C), temperature source Oral, resp. rate 15, height '5\' 6"'  (1.676 m), weight 125.2 kg, SpO2 100 %.  PHYSICAL EXAMINATION:  Physical Exam  GENERAL:  73  y.o.-year-old patient lying in the bed with no acute distress.  EYES: Pupils equal, round, reactive to light and accommodation. No scleral icterus. Extraocular muscles intact.  HEENT: Head atraumatic, normocephalic. Oropharynx and nasopharynx clear.  NECK:  Supple, no jugular venous distention. No thyroid enlargement, no tenderness.  LUNGS: Normal breath sounds bilaterally, no wheezing, rales,rhonchi or crepitation. No use of accessory muscles of respiration.  CARDIOVASCULAR: Regular rate and rhythm, S1, S2 normal. No murmurs, rubs, or gallops.  ABDOMEN: Soft, nondistended, nontender. Bowel sounds present. No organomegaly or mass.  EXTREMITIES: No pedal edema, cyanosis, or clubbing.  NEUROLOGIC: Cranial nerves II through XII are intact. Muscle strength 5/5 in all extremities. Sensation intact. Gait not checked.  PSYCHIATRIC: The patient is alert and oriented x 3.  Normal affect and good eye contact. SKIN: No obvious rash, lesion, or ulcer.   LABORATORY PANEL:   CBC Recent Labs  Lab 09/10/18 0125  WBC  9.3  HGB 11.3*  HCT 36.0  PLT 374   ------------------------------------------------------------------------------------------------------------------  Chemistries  Recent Labs  Lab 09/10/18 0125  NA 136  K 3.8  CL 103  CO2 27  GLUCOSE 121*  BUN 25*  CREATININE 1.05*  CALCIUM 9.2  AST 18  ALT 11  ALKPHOS 108  BILITOT 0.3   ------------------------------------------------------------------------------------------------------------------  Cardiac Enzymes No results for input(s): TROPONINI in the last 168 hours. ------------------------------------------------------------------------------------------------------------------  RADIOLOGY:  Ct Head Code Stroke Wo Contrast  Result Date: 09/10/2018 CLINICAL DATA:  Code stroke.  Acute onset weakness. EXAM: CT HEAD WITHOUT CONTRAST TECHNIQUE: Contiguous axial images were obtained from the base of the skull through the vertex  without intravenous contrast. COMPARISON:  12/31/2014 FINDINGS: Brain: There is no mass, hemorrhage or extra-axial collection. The size and configuration of the ventricles and extra-axial CSF spaces are normal. The brain parenchyma is normal, without evidence of acute or chronic infarction. Vascular: No abnormal hyperdensity of the major intracranial arteries or dural venous sinuses. No intracranial atherosclerosis. Skull: The visualized skull base, calvarium and extracranial soft tissues are normal. Sinuses/Orbits: No fluid levels or advanced mucosal thickening of the visualized paranasal sinuses. No mastoid or middle ear effusion. The orbits are normal. ASPECTS [facility] Stroke Program Early CT Score) - Ganglionic level infarction (caudate, lentiform nuclei, internal capsule, insula, M1-M3 cortex): 7 - Supraganglionic infarction (M4-M6 cortex): 3 Total score (0-10 with 10 being normal): 10 IMPRESSION: 1. Normal aging brain. 2. ASPECTS is ten. These results were called by telephone at the time of interpretation on 09/10/2018 at 1:39 am to Dr. Hinda Kehr , who verbally acknowledged these results. Electronically Signed   By: Ulyses Jarred M.D.   On: 09/10/2018 01:39      IMPRESSION AND PLAN:   1.  Near syncope - Continue neurochecks -Carotid Doppler studies - Neurology consulted for further evaluation and management of near syncope and lower extremity paresthesias - Continue baby aspirin  2.  Acute bilateral lower extremity paresthesias - MRI of lumbar spine to rule out acute lumbar spine process or inflammation. - PT OT consult - Pending labs as suggested by tele-neurology with ESR, TSH, B12 level, ANA  3.  Hyperlipidemia - Lipid profile - Continue statin therapy  4.  Asthma - Continue Symbicort -As needed albuterol  DVT and PPI prophylaxis    All the records are reviewed and case discussed with ED provider. The plan of care was discussed in details with the patient (and family). I  answered all questions. The patient agreed to proceed with the above mentioned plan. Further management will depend upon hospital course.   CODE STATUS: Full code  TOTAL TIME TAKING CARE OF THIS PATIENT: 23mnutes.    ALeChee8/16/2020 at 3:27 AM  Pager - [phone] After 6pm go to [email or web address] - [email or web address] [facility]  Office  [phone] CC: Primary care physician; TDelsa Grana PA-C   Note: This dictation was prepared with Dragon dictation along with smaller phrase technology. Any transcriptional errors that result from this process are unintentional.

## 2018-09-10 NOTE — Discharge Summary (Signed)
[facility] [place] NAME: Colleen [name]    MR#:  [id number]  DATE OF BIRTH:  Mar 17, 1945  DATE OF ADMISSION:  09/10/2018 ADMITTING PHYSICIAN: Christel Mormon, MD  DATE OF DISCHARGE: 09/10/2018  PRIMARY CARE PHYSICIAN: Delsa Grana, PA-C    ADMISSION DIAGNOSIS:  Bilateral leg numbness [R20.0] Bilateral leg weakness [R29.898] Near syncope [R55]  DISCHARGE DIAGNOSIS:  numbness bilateral lower extremity suspected due to severe lumbar spinal stenosis L2-3 dizziness resolved--- stroke workup negative  SECONDARY DIAGNOSIS:   Past Medical History:  Diagnosis Date  . Asthma   . Cataract   . COPD (chronic obstructive pulmonary disease) ([place])   . GERD (gastroesophageal reflux disease)   . Gout   . Hyperlipidemia   . Hypertension   . Osteoarthrosis   . Prediabetes     HOSPITAL COURSE:  1.  Near syncope--resolved. Unclear etiology -Carotid Doppler mild atherosclerosis. - Neurology consulted appreciated with Dr. Doy Mince  -CT head and neck with contrast negative. No evidence of acute stroke. - Continue baby aspirin and statins  2.  Acute bilateral lower extremity paresthesias - MRI of lumbar spine severe L2-3 stenosis. Patient recommended follow-up primary care physician and if symptoms get worse than ortho spine as outpatient - labs as suggested by tele-neurology with ESR 59, TSH 2.3, B12 level-wnl  -patient ambulates using of cane. No recent falls.  3.  Hyperlipidemia - Lipid profile-- reviewed. - Continue statin therapy  4.  Asthma - Continue Symbicort -As needed albuterol  overall she is back at baseline. Patient will discharged to home. Neurology agrees with plan. Patient agreeable with discharge plan as well CONSULTS OBTAINED:  Treatment Team:  Catarina Hartshorn, MD Alexis Goodell, MD  DRUG ALLERGIES:  No Known Allergies  DISCHARGE MEDICATIONS:   Allergies as of 09/10/2018   No Known Allergies      Medication List    STOP taking these medications   pantoprazole 40 MG tablet Commonly known as: Protonix     TAKE these medications   allopurinol 100 MG tablet Commonly known as: ZYLOPRIM Take 1.5 tablets (150 mg total) by mouth daily.   aspirin EC 81 MG tablet Take 81 mg by mouth daily.   Blood Pressure Kit Devi 1 Device by Does not apply route daily. Fluctuating blood pressure; please get appropriate size, large cuff   enalapril 10 MG tablet Commonly known as: VASOTEC Take 1 tablet (10 mg total) by mouth daily.   famotidine 20 MG tablet Commonly known as: Pepcid Take 1 tablet (20 mg total) by mouth at bedtime. This is to replace protonix.   hydrochlorothiazide 12.5 MG capsule Commonly known as: MICROZIDE Take 1 capsule (12.5 mg total) by mouth daily.   lovastatin 40 MG tablet Commonly known as: MEVACOR Take 1 tablet (40 mg total) by mouth at bedtime.   ProAir HFA 108 (90 Base) MCG/ACT inhaler Generic drug: albuterol INHALE 2 PUFFS BY MOUTH EVERY 4 HOURS AS NEEDED FOR WHEEZING FOR SHORTNESS OF BREATH   simethicone 80 MG chewable tablet Commonly known as: Gas-X Chew 1 tablet (80 mg total) by mouth 2 (two) times a day.   Symbicort 160-4.5 MCG/ACT inhaler Generic drug: budesonide-formoterol Inhale 2 puffs into the lungs 2 (two) times daily.   triamcinolone cream 0.1 % Commonly known as: KENALOG APPLY CREAM EXTERNALLY TO AFFECTED AREA TWICE DAILY if needed; not more than one week at a time, then take a break for two weeks   Vitamin D (Cholecalciferol) 25 MCG (  1000 UT) Tabs Take 1 tablet by mouth daily.       If you experience worsening of your admission symptoms, develop shortness of breath, life threatening emergency, suicidal or homicidal thoughts you must seek medical attention immediately by calling 911 or calling your MD immediately  if symptoms less severe.  You Must read complete instructions/literature along with all the possible adverse reactions/side  effects for all the Medicines you take and that have been prescribed to you. Take any new Medicines after you have completely understood and accept all the possible adverse reactions/side effects.   Please note  You were cared for by a hospitalist during your hospital stay. If you have any questions about your discharge medications or the care you received while you were in the hospital after you are discharged, you can call the unit and asked to speak with the hospitalist on call if the hospitalist that took care of you is not available. Once you are discharged, your primary care physician will handle any further medical issues. Please note that NO REFILLS for any discharge medications will be authorized once you are discharged, as it is imperative that you return to your primary care physician (or establish a relationship with a primary care physician if you do not have one) for your aftercare needs so that they can reassess your need for medications and monitor your lab values. Today   SUBJECTIVE  is any tingling numbness. No dysarthria or visual disturbance   VITAL SIGNS:  Blood pressure 129/66, pulse 79, temperature 98.5 F (36.9 C), resp. rate 16, height _0  (1.676 m), weight 125.2 kg, SpO2 100 %.  I/O:  No intake or output data in the 24 hours ending 09/10/18 1213  PHYSICAL EXAMINATION:  GENERAL:  73 y.o.-year-old patient lying in the bed with no acute distress. Morbid obesity EYES: Pupils equal, round, reactive to light and accommodation. No scleral icterus. Extraocular muscles intact.  HEENT: Head atraumatic, normocephalic. Oropharynx and nasopharynx clear.  NECK:  Supple, no jugular venous distention. No thyroid enlargement, no tenderness.  LUNGS: Normal breath sounds bilaterally, no wheezing, rales,rhonchi or crepitation. No use of accessory muscles of respiration.  CARDIOVASCULAR: S1, S2 normal. No murmurs, rubs, or gallops.  ABDOMEN: Soft, non-tender, non-distended. Bowel  sounds present. No organomegaly or mass.  EXTREMITIES: No pedal edema, cyanosis, or clubbing.  NEUROLOGIC: Cranial nerves II through XII are intact. Muscle strength 5/5 in all extremities. Sensation intact. Gait not checked.  PSYCHIATRIC: The patient is alert and oriented x 3.  SKIN: No obvious rash, lesion, or ulcer.   DATA REVIEW:   CBC  Recent Labs  Lab 09/10/18 0507  WBC 7.4  HGB 11.6*  HCT 37.2  PLT 384    Chemistries  Recent Labs  Lab 09/10/18 0125 09/10/18 0507  NA 136 136  K 3.8 3.9  CL 103 103  CO2 27 28  GLUCOSE 121* 130*  BUN 25* 23  CREATININE 1.05* 0.97  CALCIUM 9.2 9.3  AST 18  --   ALT 11  --   ALKPHOS 108  --   BILITOT 0.3  --     Microbiology Results   No results found for this or any previous visit (from the past 240 hour(s)).  RADIOLOGY:  Ct Angio Head W Or Wo Contrast  Result Date: 09/10/2018 CLINICAL DATA:  Recurrent syncope EXAM: CT ANGIOGRAPHY HEAD AND NECK TECHNIQUE: Multidetector CT imaging of the head and neck was performed using the standard protocol during bolus administration of intravenous  contrast. Multiplanar CT image reconstructions and MIPs were obtained to evaluate the vascular anatomy. Carotid stenosis measurements (when applicable) are obtained utilizing NASCET criteria, using the distal internal carotid diameter as the denominator. CONTRAST:  37m OMNIPAQUE IOHEXOL 350 MG/ML SOLN COMPARISON:  Head CT from earlier today FINDINGS: CTA NECK FINDINGS Aortic arch: Mild atherosclerotic calcification. Three vessel branching. Right carotid system: Calcified plaque at the ICA bulb with 25% narrowing based on coronal reformats. No ulceration or beading Left carotid system: Mild calcified plaque at the ICA bulb without flow limiting stenosis or ulceration. Vertebral arteries: No proximal subclavian stenosis. Fairly early branching of the left vertebral artery. Dominant right vertebral artery. Both vertebral arteries are smooth and widely patent  to the dura. Skeleton: Disc and facet degeneration with C3-4 anterolisthesis. No acute or aggressive finding. Other neck: Partial retropharyngeal course of the right ICA. Upper chest: Negative Review of the MIP images confirms the above findings CTA HEAD FINDINGS Anterior circulation: Atherosclerotic plaque along both carotid siphons. No branch occlusion or flow limiting stenosis. Negative for aneurysm. Posterior circulation: Right dominant vertebral artery. Vertebral and basilar arteries are smooth and widely patent. No branch occlusion, beading, or aneurysm. Venous sinuses: Patent Anatomic variants: Incomplete circle-of-Willis with no visible anterior left posterior communicating arteries. Review of the MIP images confirms the above findings IMPRESSION: 1. No emergent finding or evidence of vertebrobasilar insufficiency. 2. Carotid atherosclerosis without flow limiting stenosis. Electronically Signed   By: JMonte FantasiaM.D.   On: 09/10/2018 11:30   Ct Angio Neck W Or Wo Contrast  Result Date: 09/10/2018 CLINICAL DATA:  Recurrent syncope EXAM: CT ANGIOGRAPHY HEAD AND NECK TECHNIQUE: Multidetector CT imaging of the head and neck was performed using the standard protocol during bolus administration of intravenous contrast. Multiplanar CT image reconstructions and MIPs were obtained to evaluate the vascular anatomy. Carotid stenosis measurements (when applicable) are obtained utilizing NASCET criteria, using the distal internal carotid diameter as the denominator. CONTRAST:  776mOMNIPAQUE IOHEXOL 350 MG/ML SOLN COMPARISON:  Head CT from earlier today FINDINGS: CTA NECK FINDINGS Aortic arch: Mild atherosclerotic calcification. Three vessel branching. Right carotid system: Calcified plaque at the ICA bulb with 25% narrowing based on coronal reformats. No ulceration or beading Left carotid system: Mild calcified plaque at the ICA bulb without flow limiting stenosis or ulceration. Vertebral arteries: No proximal  subclavian stenosis. Fairly early branching of the left vertebral artery. Dominant right vertebral artery. Both vertebral arteries are smooth and widely patent to the dura. Skeleton: Disc and facet degeneration with C3-4 anterolisthesis. No acute or aggressive finding. Other neck: Partial retropharyngeal course of the right ICA. Upper chest: Negative Review of the MIP images confirms the above findings CTA HEAD FINDINGS Anterior circulation: Atherosclerotic plaque along both carotid siphons. No branch occlusion or flow limiting stenosis. Negative for aneurysm. Posterior circulation: Right dominant vertebral artery. Vertebral and basilar arteries are smooth and widely patent. No branch occlusion, beading, or aneurysm. Venous sinuses: Patent Anatomic variants: Incomplete circle-of-Willis with no visible anterior left posterior communicating arteries. Review of the MIP images confirms the above findings IMPRESSION: 1. No emergent finding or evidence of vertebrobasilar insufficiency. 2. Carotid atherosclerosis without flow limiting stenosis. Electronically Signed   By: JoMonte Fantasia.D.   On: 09/10/2018 11:30   Mr Lumbar Spine W Wo Contrast  Result Date: 09/10/2018 CLINICAL DATA:  Bilateral foot paresthesias and numbness. EXAM: MRI LUMBAR SPINE WITHOUT AND WITH CONTRAST TECHNIQUE: Multiplanar and multiecho pulse sequences of the lumbar spine were obtained without and  with intravenous contrast. CONTRAST:  10 mL Gadavist COMPARISON:  None. FINDINGS: Segmentation:  Normal Alignment:  Normal Vertebrae:  No fracture, evidence of discitis, or bone lesion. Conus medullaris and cauda equina: Conus extends to the L1 level. Conus and cauda equina appear normal. Paraspinal and other soft tissues: Negative Disc levels: T11-12: Normal. T12-L1: Mild disc bulge without spinal canal or neural foraminal stenosis. L1-2: Disc space narrowing with moderate spinal canal stenosis. There is moderate right and severe left neural  foraminal stenosis. L2-3: Intermediate disc bulge with severe spinal canal stenosis. Moderate right and mild left neural foraminal stenosis. L3-4: Intermediate disc bulge with mild spinal canal stenosis. Moderate right and mild left neural foraminal stenosis. Mild facet hypertrophy. L4-5: Remote right hemilaminectomy. Moderate bilateral facet hypertrophy. Mild disc bulge with endplate spurring. No central spinal canal stenosis. Mild right and moderate left neural foraminal stenosis. No abnormal contrast enhancement. L5-S1: Remote right laminectomy. No spinal canal stenosis. Intermediate disc bulge with central annular fissure. Moderate right and severe left neural foraminal stenosis. No abnormal contrast enhancement. IMPRESSION: 1. Multilevel lumbar degenerative disc disease with severe L2-3 and moderate L1-2 spinal canal stenosis. 2. Postsurgical changes at L4-5 and L5-S1 without residual spinal canal stenosis but there is moderate-to-severe neural foraminal stenosis at both levels. 3. Moderate-to-severe neural foraminal stenosis at bilateral L1-2, right L2-3, right L3-4, left L4-5 and bilateral L5-S1. Electronically Signed   By: Ulyses Jarred M.D.   On: 09/10/2018 04:57   US Carotid Bilateral  Result Date: 09/10/2018 CLINICAL DATA:  Dizziness and numbness. EXAM: BILATERAL CAROTID DUPLEX ULTRASOUND TECHNIQUE: Pearline Cables scale imaging, color Doppler and duplex ultrasound were performed of bilateral carotid and vertebral arteries in the neck. COMPARISON:  None. FINDINGS: Criteria: Quantification of carotid stenosis is based on velocity parameters that correlate the residual internal carotid diameter with NASCET-based stenosis levels, using the diameter of the distal internal carotid lumen as the denominator for stenosis measurement. The following velocity measurements were obtained: RIGHT ICA: 103/25 cm/sec CCA: 22/29 cm/sec SYSTOLIC ICA/CCA RATIO:  1.1 ECA: 105 cm/sec LEFT ICA: 87/32 cm/sec CCA: 798/92 cm/sec SYSTOLIC  ICA/CCA RATIO:  0.7 ECA: 99 cm/sec RIGHT CAROTID ARTERY: Echogenic plaque at the right carotid bulb. External carotid artery is patent with normal waveform. Echogenic plaque in the internal carotid artery without significant stenosis. Normal waveforms and velocities in the internal carotid artery. RIGHT VERTEBRAL ARTERY: Antegrade flow and normal waveform in the right vertebral artery. LEFT CAROTID ARTERY: Small amount of plaque at the left carotid bulb. External carotid artery is patent with normal waveform. Small amount of plaque and intimal thickening in the proximal internal carotid artery. Normal waveforms and velocities in the internal carotid artery. LEFT VERTEBRAL ARTERY: Antegrade flow and normal waveform in the left vertebral artery. IMPRESSION: Mild atherosclerotic disease in the bilateral carotid arteries. Estimated degree of stenosis in the internal carotid arteries is less than 50% bilaterally. Patent vertebral arteries with antegrade flow. Electronically Signed   By: Markus Daft M.D.   On: 09/10/2018 09:26   Ct Head Code Stroke Wo Contrast  Result Date: 09/10/2018 CLINICAL DATA:  Code stroke.  Acute onset weakness. EXAM: CT HEAD WITHOUT CONTRAST TECHNIQUE: Contiguous axial images were obtained from the base of the skull through the vertex without intravenous contrast. COMPARISON:  12/31/2014 FINDINGS: Brain: There is no mass, hemorrhage or extra-axial collection. The size and configuration of the ventricles and extra-axial CSF spaces are normal. The brain parenchyma is normal, without evidence of acute or chronic infarction. Vascular: No abnormal  hyperdensity of the major intracranial arteries or dural venous sinuses. No intracranial atherosclerosis. Skull: The visualized skull base, calvarium and extracranial soft tissues are normal. Sinuses/Orbits: No fluid levels or advanced mucosal thickening of the visualized paranasal sinuses. No mastoid or middle ear effusion. The orbits are normal. ASPECTS  [facility] Stroke Program Early CT Score) - Ganglionic level infarction (caudate, lentiform nuclei, internal capsule, insula, M1-M3 cortex): 7 - Supraganglionic infarction (M4-M6 cortex): 3 Total score (0-10 with 10 being normal): 10 IMPRESSION: 1. Normal aging brain. 2. ASPECTS is ten. These results were called by telephone at the time of interpretation on 09/10/2018 at 1:39 am to Dr. Hinda Kehr , who verbally acknowledged these results. Electronically Signed   By: Ulyses Jarred M.D.   On: 09/10/2018 01:39     CODE STATUS:     Code Status Orders  (From admission, onward)         Start     Ordered   09/10/18 0331  Full code  Continuous     09/10/18 0330        Code Status History    This patient has a current code status but no historical code status.   Advance Care Planning Activity    Advance Directive Documentation     Most Recent Value  Type of Advance Directive  Healthcare Power of Attorney  Pre-existing out of facility DNR order (yellow form or pink MOST form)  -  "MOST" Form in Place?  -      TOTAL TIME TAKING CARE OF THIS PATIENT: *40* minutes.    Fritzi Mandes M.D on 09/10/2018 at 12:13 PM  Between 7am to 6pm - Pager - [phone] After 6pm go to [email or web address] - password [email or web address]  Office  [phone]  CC: Primary care physician; Delsa Grana, PA-C

## 2018-09-10 NOTE — ED Notes (Addendum)
ED TO INPATIENT HANDOFF REPORT  ED Nurse Name and Phone #: Karena Addison [phone]  S Name/Age/Gender Colleen [name] 73 y.o. female Room/Bed: ED18A/ED18A  Code Status   Code Status: Not on file  Home/SNF/Other Home Patient oriented to: self, place, time and situation Is this baseline? Yes      Chief Complaint dizzy and numbness  Triage Note Patient reports she got up to go to the bathroom and became dizzy like she was going to pass out and then both legs went numb from feet almost up to her knees.   Allergies No Known Allergies  Level of Care/Admitting Diagnosis ED Disposition    ED Disposition Condition Comment   Admit  The patient appears reasonably stabilized for admission considering the current resources, flow, and capabilities available in the ED at this time, and I doubt any other [facility] requiring further screening and/or treatment in the ED prior to admission is  present.       B Medical/Surgery History Past Medical History:  Diagnosis Date  . Asthma   . Cataract   . COPD (chronic obstructive pulmonary disease) ([place])   . GERD (gastroesophageal reflux disease)   . Gout   . Hyperlipidemia   . Hypertension   . Osteoarthrosis   . Prediabetes    Past Surgical History:  Procedure Laterality Date  . BREAST EXCISIONAL BIOPSY Left 04/08/2014   Intraductal papilloma-5 mm  . LUMBAR LAMINECTOMY    . TONSILLECTOMY       A IV Location/Drains/Wounds Patient Lines/Drains/Airways Status   Active Line/Drains/Airways    Name:   Placement date:   Placement time:   Site:   Days:   Peripheral IV 09/10/18 Left Antecubital   09/10/18    0139    Antecubital   less than 1          Intake/Output Last 24 hours No intake or output data in the 24 hours ending 09/10/18 0319  Labs/Imaging Results for orders placed or performed during the hospital encounter of 09/10/18 (from the past 48 hour(s))  Glucose, capillary     Status: None   Collection Time: 09/10/18  1:19 AM  Result Value  Ref Range   Glucose-Capillary 94 70 - 99 mg/dL  Protime-INR     Status: None   Collection Time: 09/10/18  1:25 AM  Result Value Ref Range   Prothrombin Time 12.0 11.4 - 15.2 seconds   INR 0.9 0.8 - 1.2    Comment: (NOTE) INR goal varies based on device and disease states. Performed at [facility], [address] [place], [place] [address]   APTT     Status: None   Collection Time: 09/10/18  1:25 AM  Result Value Ref Range   aPTT 30 24 - 36 seconds    Comment: Performed at [facility], [address] [place], [place] [address]  CBC     Status: Abnormal   Collection Time: 09/10/18  1:25 AM  Result Value Ref Range   WBC 9.3 4.0 - 10.5 K/uL   RBC 4.05 3.87 - 5.11 MIL/uL   Hemoglobin 11.3 (L) 12.0 - 15.0 g/dL   HCT 36.0 36.0 - 46.0 %   MCV 88.9 80.0 - 100.0 fL   MCH 27.9 26.0 - 34.0 pg   MCHC 31.4 30.0 - 36.0 g/dL   RDW 14.2 11.5 - 15.5 %   Platelets 374 150 - 400 K/uL   nRBC 0.0 0.0 - 0.2 %    Comment: Performed at [facility], [address] [place], [place] [address]  Differential     Status: Abnormal   Collection Time: 09/10/18  1:25 AM  Result Value Ref Range   Neutrophils Relative % 41 %   Neutro Abs 3.8 1.7 - 7.7 K/uL   Lymphocytes Relative 48 %   Lymphs Abs 4.5 (H) 0.7 - 4.0 K/uL   Monocytes Relative 7 %   Monocytes Absolute 0.7 0.1 - 1.0 K/uL   Eosinophils Relative 3 %   Eosinophils Absolute 0.2 0.0 - 0.5 K/uL   Basophils Relative 1 %   Basophils Absolute 0.1 0.0 - 0.1 K/uL   Immature Granulocytes 0 %   Abs Immature Granulocytes 0.01 0.00 - 0.07 K/uL    Comment: Performed at [facility], [address] [place], [place] [address]  Comprehensive metabolic panel     Status: Abnormal   Collection Time: 09/10/18  1:25 AM  Result Value Ref Range   Sodium 136 135 - 145 mmol/L   Potassium 3.8 3.5 - 5.1 mmol/L   Chloride 103 98 - 111 mmol/L   CO2 27 22 - 32 mmol/L   Glucose, Bld 121 (H) 70 - 99 mg/dL   BUN 25 (H) 8 - 23  mg/dL   Creatinine, Ser 1.05 (H) 0.44 - 1.00 mg/dL   Calcium 9.2 8.9 - 10.3 mg/dL   Total Protein 7.8 6.5 - 8.1 g/dL   Albumin 3.5 3.5 - 5.0 g/dL   AST 18 15 - 41 U/L   ALT 11 0 - 44 U/L   Alkaline Phosphatase 108 38 - 126 U/L   Total Bilirubin 0.3 0.3 - 1.2 mg/dL   GFR calc non Af Amer 53 (L) >60 mL/min   GFR calc Af Amer >60 >60 mL/min   Anion gap 6 5 - 15    Comment: Performed at [facility], [address] [place], [place] [address]  Sedimentation rate     Status: Abnormal   Collection Time: 09/10/18  1:25 AM  Result Value Ref Range   Sed Rate 59 (H) 0 - 30 mm/hr    Comment: Performed at [facility], [address] [place], [place] [address]   Ct Head Code Stroke Wo Contrast  Result Date: 09/10/2018 CLINICAL DATA:  Code stroke.  Acute onset weakness. EXAM: CT HEAD WITHOUT CONTRAST TECHNIQUE: Contiguous axial images were obtained from the base of the skull through the vertex without intravenous contrast. COMPARISON:  12/31/2014 FINDINGS: Brain: There is no mass, hemorrhage or extra-axial collection. The size and configuration of the ventricles and extra-axial CSF spaces are normal. The brain parenchyma is normal, without evidence of acute or chronic infarction. Vascular: No abnormal hyperdensity of the major intracranial arteries or dural venous sinuses. No intracranial atherosclerosis. Skull: The visualized skull base, calvarium and extracranial soft tissues are normal. Sinuses/Orbits: No fluid levels or advanced mucosal thickening of the visualized paranasal sinuses. No mastoid or middle ear effusion. The orbits are normal. ASPECTS [facility] Stroke Program Early CT Score) - Ganglionic level infarction (caudate, lentiform nuclei, internal capsule, insula, M1-M3 cortex): 7 - Supraganglionic infarction (M4-M6 cortex): 3 Total score (0-10 with 10 being normal): 10 IMPRESSION: 1. Normal aging brain. 2. ASPECTS is ten. These results were called by telephone at the time of  interpretation on 09/10/2018 at 1:39 am to Dr. Hinda Kehr , who verbally acknowledged these results. Electronically Signed   By: Ulyses Jarred M.D.   On: 09/10/2018 01:39    Pending Labs [employer] (From  admission, onward)    Start     Ordered   09/10/18 0301  SARS CORONAVIRUS 2 Nasal Swab Aptima Multi Swab  (Asymptomatic/Tier 2 Patients Labs)  Once,   STAT    Question Answer Comment  Is this test for diagnosis or screening Screening   Symptomatic for COVID-19 as defined by CDC No   Hospitalized for COVID-19 No   Admitted to ICU for COVID-19 No   Previously tested for COVID-19 No   Resident in a congregate (group) care setting No   Employed in healthcare setting No   Pregnant No      09/10/18 0300   09/10/18 0246  TSH  Add-on,   AD     09/10/18 0246   09/10/18 0246  Vitamin B12  Add-on,   AD     09/10/18 0246   09/10/18 0246  Antinuclear Antibodies, IFA  Add-on,   AD     09/10/18 0246          Vitals/Pain Today's Vitals   09/10/18 0145 09/10/18 0200 09/10/18 0230 09/10/18 0300  BP:  137/65 128/64 122/68  Pulse: 80   73  Resp: 18 14 17 15   Temp:      TempSrc:      SpO2: 93%   100%  Weight:      Height:      PainSc:        Isolation Precautions No active isolations  Medications Medications  sodium chloride flush (NS) 0.9 % injection 3 mL ( Intravenous Canceled Entry 09/10/18 0141)    Mobility walks     Focused Assessments    R Recommendations: See Admitting Provider Note  Report given to: Arbutus Ped, RN

## 2018-09-10 NOTE — ED Triage Notes (Signed)
Patient reports she got up to go to the bathroom and became dizzy like she was going to pass out and then both legs went numb from feet almost up to her knees.

## 2018-09-10 NOTE — Progress Notes (Signed)
CODE STROKE- PHARMACY COMMUNICATION   Time CODE STROKE called/page received: [id number]  Time response to CODE STROKE was made (in person or via phone):   Time Stroke Kit retrieved from [place] (only if needed): patient deemed not to need tPA by neurology  Name of Provider/Nurse contacted: N/A  Past Medical History:  Diagnosis Date  . Asthma   . Cataract   . COPD (chronic obstructive pulmonary disease) ([place])   . GERD (gastroesophageal reflux disease)   . Gout   . Hyperlipidemia   . Hypertension   . Osteoarthrosis   . Prediabetes    Prior to Admission medications   Medication Sig Start Date End Date Taking? Authorizing Provider  allopurinol (ZYLOPRIM) 100 MG tablet Take 1.5 tablets (150 mg total) by mouth daily. 09/05/18 12/04/18  Poulose, Bethel Born, NP  aspirin EC 81 MG tablet Take 81 mg by mouth daily.    [provider]  Blood Pressure Monitoring (BLOOD PRESSURE KIT) DEVI 1 Device by Does not apply route daily. Fluctuating blood pressure; please get appropriate size, large cuff 02/03/18   Lada, Satira Anis, MD  enalapril (VASOTEC) 10 MG tablet Take 1 tablet (10 mg total) by mouth daily. 09/05/18   Poulose, Bethel Born, NP  famotidine (PEPCID) 20 MG tablet Take 1 tablet (20 mg total) by mouth at bedtime. This is to replace protonix. 09/05/18   Poulose, Bethel Born, NP  hydrochlorothiazide (MICROZIDE) 12.5 MG capsule Take 1 capsule (12.5 mg total) by mouth daily. 09/05/18   Poulose, Bethel Born, NP  lovastatin (MEVACOR) 40 MG tablet Take 1 tablet (40 mg total) by mouth at bedtime. 09/05/18   Poulose, Bethel Born, NP  pantoprazole (PROTONIX) 40 MG tablet Take 1 tablet (40 mg total) by mouth daily. 08/02/18 10/31/18  Harvest Dark, MD  PROAIR HFA 108 [phone] Base) MCG/ACT inhaler INHALE 2 PUFFS BY MOUTH EVERY 4 HOURS AS NEEDED FOR WHEEZING FOR SHORTNESS OF BREATH 09/06/18   Delsa Grana, PA-C  simethicone (GAS-X) 80 MG chewable tablet Chew 1 tablet (80 mg total) by mouth 2 (two) times a day.  08/02/18 10/31/18  Harvest Dark, MD  SYMBICORT 160-4.5 MCG/ACT inhaler Inhale 2 puffs into the lungs 2 (two) times daily. 09/05/18   Poulose, Bethel Born, NP  triamcinolone cream (KENALOG) 0.1 % APPLY CREAM EXTERNALLY TO AFFECTED AREA TWICE DAILY if needed; not more than one week at a time, then take a break for two weeks 05/30/18   Hubbard Hartshorn, FNP  Vitamin D, Cholecalciferol, 25 MCG (1000 UT) TABS Take by mouth.    [provider]    Tobie Lords ,PharmD Clinical Pharmacist  09/10/2018  3:06 AM

## 2018-09-10 NOTE — Progress Notes (Signed)
Discharge instructions given and went over with patient at bedside. All questions answered. Patient discharged home. Chanz Cahall S, RN  

## 2018-09-10 NOTE — ED Notes (Signed)
Pt to CT 0125

## 2018-09-10 NOTE — Consult Note (Signed)
[facility] TeleNeurology Consult Services   TeleStroke Metrics: LKW: [id number] Door Time: 0103  [facility] Contacted: 0200  [facility] at Bedside: 0206 NIHSS: 0214 Decision on Alteplase: Not to give as her NIH stroke scale score is currently 0, and presentation seems atypical for an acute ischemic stroke.  Patient was in agreement with this decision. Interventional Candidate: Not a candidate as her symptoms are not consistent with a large vessel proximal occlusion.   Chief Complaint: Bilateral leg paresthesias and numbness   HPI: Asked to see this patient in emergent telemedicine consultation utilizing interactive audio and video technologies. Consultation was performed with assistance of ancillary / medical staff at bedside. Verbal consent to perform the examination with telemedicine was obtained. Patient agreed to proceed with the consultation for acute stroke protocol.  73 year old right-handed African-American female who was brought to the emergency room by her sister for bilateral feet paresthesias and numbness.  Patient currently takes a baby aspirin.  She has never had a stroke or TIA before.  She does report chronic low back pain with prior lumbar spine surgery.  Patient states that around 12:30 AM she had just finished watching TV and got up to use the bathroom.  She remembered feeling lightheaded and dizzy and sat on the toilet.  She then developed bilateral feet paresthesias and numbness that went to about below her knee.  She stated that her back pain is stable.  She does have trouble bending down at times.  She denies any recent vaccinations.  No recent COVID exposure or COVID symptoms.  No bowel or bladder incontinence.  Her numbness had persisted, and she called her sister to bring her to the emergency room.  Currently on examination, the patient is alert and oriented x3.  No slurred speech or aphasia.  No focal motor weakness or numbness.  Head CT was negative.  Blood  pressure and labs are stable.  I reviewed with the patient about the availability of IV alteplase.  I reviewed with her about some of the potential side effects of IV alteplase to include an approximate 6% risk of symptomatic intracranial hemorrhage, internal bleeding, and/or angioedema.  At the end the day, patient was in agreement with not to proceed with IV alteplase administration as symptoms are not consistent with an acute ischemic stroke given the bilateral involvement of her symptoms.  She was in agreement with a more conservative approach.   PMH: Osteoarthritis, prediabetes mellitus, hypertension, hyperlipidemia, gout, GERD, and COPD.  Patient also reports chronic low back pain with prior lumbar spine surgery.   SOC: Negative x3.  Patient lives alone.   [place]: Negative for stroke.   ROS: 13 point review systems were reviewed with the patient, and are all negative with the exception of the aforementioned in the history of present illness.   VS: Temperature 98.4 F, respiration 14, blood pressure 137/65   Exam: Patient is in no apparent distress.  Patient appears as stated age.  No obvious acute respiratory or cardiac distress.  Patient is well groomed and well-nourished. 1a- LOC: Keenly responsive - 0 1b- LOC questions: Answers both questions correctly - 0 1c- LOC commands- Performs both tasks correctly- 0 2- Gaze: Normal; no gaze paresis or gaze deviation - 0 3- Visual Fields: normal, no Visual field deficit - 0 4- Facial movements: no facial palsy - 0 5- Upper limb motor - no drift - 0 6- Lower limb motor - no drift - 0 7- Limb Coordination: absent ataxia - 0 8- Sensory: no sensory  loss - 0 9- Language - No aphasia - 0 10- Speech - No dysarthria -0 11- Neglect / Extinction - none found - 0 NIHSS score: 0   Diagnostic Data: CT head showed no acute intracranial process  WBC 9.3, hemoglobin 9.3, platelet 374, PT 12, INR 0.9, recent hemoglobin A1c 6.2%, sodium 136, potassium 3.8,  BUN 25, creatinine 1.05, blood glucose 121  Medical Data Reviewed: 1.Data?reviewed include clinical labs, radiology,?and medical tests; 2.Tests?results discussed w/performing or interpreting physician; 3.Obtaining/reviewing old medical records; 4.Obtaining?case history from another source; 5.Independent?review of image, tracing, or specimen.   Medical Decision Making: - Extensive number of diagnosis or management options are considered below. - Extensive amount of complex data reviewed. - High risk of complication and/or morbidity or mortality are associated with differential diagnostic considerations below. - There may be?uncertain?outcome and increased probability of prolonged functional impairment or high probability of severe prolonged functional impairment associated with some of these differential diagnosis.   Assessment: 1.  Acute bilateral ascending lower extremity paresthesias and numbness 2.  Osteoarthritis 3.  Chronic low back pain with prior lumbar spine surgery 4.  Prediabetes mellitus 5.  Hypertension 6.  Hyperlipidemia 7.  COPD 8.  GERD  Recommendations: Patient can be admitted to the hospital for further work-up of her symptoms. Consult inpatient neurology team to assist with evaluation and management. Maintain the patient on her baby aspirin for now. Check MRI of the lumbar spine with and without contrast to rule out any acute lumbar spine process or inflammatory process. Consult PT and OT. Check ESR, TSH, B12 level, ANA Continue supportive care Plan of care was discussed with the patient and her family  Thank you for allowing [facility] to participate in the care of your patient. Please call me, Dr. Dale [place], with any questions at [phone]. Case discussed with the ER staff and Dr. Karma Greaser.   Critical Care notation:   I was called to see this critical patient emergently. I personally evaluated this critical patient for acute stroke evaluation, and  determining their eligibility for IV Alteplase and interventional therapies.  I have spent approximately 12 minutes with the patient, including time at bedside, time discussing the case with other physicians, reviewing plan of care, and time independently reviewing the records and scans.

## 2018-09-10 NOTE — Progress Notes (Signed)
Ch checked on pt that coded. Pt presented to be alert and responsive and had sister at bedside. Ch encouraged pt to hv nurse page a chaplain if there were further needs.    09/10/18 0300  Clinical Encounter Type  Visited With Patient and family together  Visit Type Social support;Code  Referral From Nurse  Consult/Referral To Chaplain  Stress Factors  Patient Stress Factors Health changes

## 2018-09-10 NOTE — ED Notes (Signed)
Covid swab walked down to lab by this tech

## 2018-09-11 LAB — ANA: Anti Nuclear Antibody (ANA): NEGATIVE

## 2018-09-12 LAB — ANTINUCLEAR ANTIBODIES, IFA: ANA Ab, IFA: NEGATIVE

## 2018-09-20 ENCOUNTER — Other Ambulatory Visit: Payer: Self-pay

## 2018-09-20 ENCOUNTER — Encounter: Payer: Self-pay | Admitting: Nurse Practitioner

## 2018-09-20 ENCOUNTER — Ambulatory Visit (INDEPENDENT_AMBULATORY_CARE_PROVIDER_SITE_OTHER): Payer: PPO | Admitting: Nurse Practitioner

## 2018-09-20 VITALS — BP 133/79 | HR 89 | Ht 65.0 in | Wt 276.0 lb

## 2018-09-20 DIAGNOSIS — I6523 Occlusion and stenosis of bilateral carotid arteries: Secondary | ICD-10-CM | POA: Insufficient documentation

## 2018-09-20 DIAGNOSIS — R200 Anesthesia of skin: Secondary | ICD-10-CM | POA: Diagnosis not present

## 2018-09-20 DIAGNOSIS — M48062 Spinal stenosis, lumbar region with neurogenic claudication: Secondary | ICD-10-CM | POA: Diagnosis not present

## 2018-09-20 DIAGNOSIS — Z09 Encounter for follow-up examination after completed treatment for conditions other than malignant neoplasm: Secondary | ICD-10-CM

## 2018-09-20 NOTE — Progress Notes (Signed)
Virtual Visit via Telephone Note  I connected with Colleen [name] on 09/20/18 at  9:20 AM EDT by telephone and verified that I am speaking with the correct person using two identifiers.   Staff discussed the limitations, risks, security and privacy concerns of performing an evaluation and management service by telephone and the availability of in person appointments. Staff also discussed with the patient that there may be a patient responsible charge related to this service. The patient expressed understanding and agreed to proceed.  Patients location: home My location: work office  Other people in meeting: none    HPI  Patient presents for telephone visit after hospital admission.  She was admitted to [facility] hospital on 8/16 and discharged later that evening.  She presented to the ER with acute onset of bilateral leg numbness and weakness and dizziness.  She does have some chronic back pain with prior surgeries but was not having any acute back pain.  CT brain was negative, MRI of lumbar spine showed multilevel lumbar degenerative disc disease with severe L2 and L3 and moderate L1-L2 spinal canal stenosis.  Normal postsurgical changes at L4 and L5, Moderate-to-severe neural foraminal stenosis at bilateral L1-2, right L2-3, right L3-4, left L4-5 and bilateral L5-S1.  Carotid ultrasound shows mild atherosclerotic disease in bilateral carotids.  CT angios of head and neck show no emergent findings or evidence of vertebrobasilar insufficiency, did note carotid atherosclerosis without flow-limiting stenosis as seen on carotids ultrasound.  Labs showing mild anemia, stage II chronic kidney disease, mildly elevated sed rate other labs were normal.  Patient saw neurologist Dr. Doy Mince during hospitalization recommended continue baby aspirin and statin, no evidence of acute stroke.  States has not had dizziness and weakness since hospital discharge. States numbness and tingling in bilateral lower extremities is  much milder, pain is rare. States she is able to get around home without much difficulty. She had back surgery back in 1984. States she does not want neurological referral.    Lab Results  Component Value Date   CHOL 175 09/05/2018   HDL 70 09/05/2018   LDLCALC 89 09/05/2018   TRIG 69 09/05/2018   CHOLHDL 2.5 09/05/2018    PHQ2/9: Depression screen [facility] 2/9 09/20/2018 09/05/2018 07/31/2018 03/07/2018 02/07/2018  Decreased Interest 0 0 0 0 0  Down, Depressed, Hopeless 0 0 0 0 0  PHQ - 2 Score 0 0 0 0 0  Altered sleeping 0 0 - 0 0  Tired, decreased energy 0 0 - 0 0  Change in appetite 0 0 - 0 0  Feeling bad or failure about yourself  0 0 - 0 0  Trouble concentrating 0 0 - 0 0  Moving slowly or fidgety/restless 0 0 - 0 0  Suicidal thoughts 0 0 - 0 0  PHQ-9 Score 0 0 - 0 0  Difficult doing work/chores Not difficult at all Not difficult at all - Not difficult at all Not difficult at all  Some recent data might be hidden    PHQ reviewed. Negative  Patient Active Problem List   Diagnosis Date Noted  . Near syncope 09/10/2018  . Aortic atherosclerosis ([place]) 09/05/2018  . At high risk for falls 09/05/2018  . Morbid obesity with BMI of 45.0-49.9, adult ([place]) 08/29/2017  . Osteopenia 05/18/2017  . Chronic kidney disease, stage III (moderate) ([place]) 04/21/2017  . Morbid obesity ([place]) 04/21/2017  . Acquired trigger finger 02/28/2017  . Chronic gouty arthropathy without tophi 11/29/2016  . Hypergammaglobulinemia 11/29/2016  .  Osteoarthritis of both knees 11/29/2016  . Allergic rhinitis, seasonal 07/30/2014  . HLD (hyperlipidemia) 07/30/2014  . Osteoarthritis of knee 07/30/2014  . Prediabetes 07/30/2014  . Asthma, mild persistent 07/30/2014  . COPD (chronic obstructive pulmonary disease) ([place]) 07/09/2008  . Benign essential HTN 06/22/2006    Past Medical History:  Diagnosis Date  . Asthma   . Cataract   . COPD (chronic obstructive pulmonary disease) ([place])   . GERD (gastroesophageal  reflux disease)   . Gout   . Hyperlipidemia   . Hypertension   . Osteoarthrosis   . Prediabetes     Past Surgical History:  Procedure Laterality Date  . BREAST EXCISIONAL BIOPSY Left 04/08/2014   Intraductal papilloma-5 mm  . LUMBAR LAMINECTOMY    . TONSILLECTOMY      Social History   Tobacco Use  . Smoking status: Former Smoker    Packs/day: 1.50    Years: 20.00    Pack years: 30.00    Types: Cigarettes    Quit date: 11/13/2008    Years since quitting: 9.8  . Smokeless tobacco: Never Used  . Tobacco comment: smoking cessation materials not required  Substance Use Topics  . Alcohol use: Not Currently    Alcohol/week: 1.0 standard drinks    Types: 1 Cans of beer per week     Current Outpatient Medications:  .  allopurinol (ZYLOPRIM) 100 MG tablet, Take 1.5 tablets (150 mg total) by mouth daily., Disp: 135 tablet, Rfl: 1 .  aspirin EC 81 MG tablet, Take 81 mg by mouth daily., Disp: , Rfl:  .  Blood Pressure Monitoring (BLOOD PRESSURE KIT) DEVI, 1 Device by Does not apply route daily. Fluctuating blood pressure; please get appropriate size, large cuff, Disp: 1 Device, Rfl: 0 .  enalapril (VASOTEC) 10 MG tablet, Take 1 tablet (10 mg total) by mouth daily., Disp: 90 tablet, Rfl: 1 .  famotidine (PEPCID) 20 MG tablet, Take 1 tablet (20 mg total) by mouth at bedtime. This is to replace protonix., Disp: 90 tablet, Rfl: 1 .  hydrochlorothiazide (MICROZIDE) 12.5 MG capsule, Take 1 capsule (12.5 mg total) by mouth daily., Disp: 90 capsule, Rfl: 1 .  lovastatin (MEVACOR) 40 MG tablet, Take 1 tablet (40 mg total) by mouth at bedtime., Disp: 90 tablet, Rfl: 1 .  PROAIR HFA 108 (90 Base) MCG/ACT inhaler, INHALE 2 PUFFS BY MOUTH EVERY 4 HOURS AS NEEDED FOR WHEEZING FOR SHORTNESS OF BREATH, Disp: 18 g, Rfl: 0 .  simethicone (GAS-X) 80 MG chewable tablet, Chew 1 tablet (80 mg total) by mouth 2 (two) times a day., Disp: 60 tablet, Rfl: 2 .  SYMBICORT 160-4.5 MCG/ACT inhaler, Inhale 2 puffs  into the lungs 2 (two) times daily., Disp: 33 g, Rfl: 0 .  triamcinolone cream (KENALOG) 0.1 %, APPLY CREAM EXTERNALLY TO AFFECTED AREA TWICE DAILY if needed; not more than one week at a time, then take a break for two weeks, Disp: 80 g, Rfl: 0 .  Vitamin D, Cholecalciferol, 25 MCG (1000 UT) TABS, Take 1 tablet by mouth daily. , Disp: , Rfl:   No Known Allergies  ROS   No other specific complaints in a complete review of systems (except as listed in HPI above).  Objective  Vitals:   09/20/18 0829  BP: 133/79  Pulse: 89  Weight: 276 lb (125.2 kg)  Height: '5\' 5"'  (1.651 m)     Body mass index is 45.93 kg/m.  Patient is alert, able to speak in full sentences without  difficulty.   Assessment & Plan 1. Hospital discharge follow-up improved  2. Bilateral leg numbness Improving   3. Spinal stenosis of lumbar region with neurogenic claudication Declines referral at this time, will let us know if she changes her mind   4. Atherosclerosis of both carotid arteries Discussed diet, LDL goal, continue meds.     I discussed the assessment and treatment plan with the patient. The patient was provided an opportunity to ask questions and all were answered. The patient agreed with the plan and demonstrated an understanding of the instructions.   The patient was advised to call back or seek an in-person evaluation if the symptoms worsen or if the condition fails to improve as anticipated.  I provided 24 minutes of non-face-to-face time during this encounter including phone call and chart review.   Fredderick Severance, NP

## 2018-10-24 ENCOUNTER — Other Ambulatory Visit: Payer: Self-pay | Admitting: Family Medicine

## 2018-10-24 DIAGNOSIS — M1A00X0 Idiopathic chronic gout, unspecified site, without tophus (tophi): Secondary | ICD-10-CM

## 2018-10-27 ENCOUNTER — Other Ambulatory Visit: Payer: Self-pay

## 2018-10-27 DIAGNOSIS — Z1231 Encounter for screening mammogram for malignant neoplasm of breast: Secondary | ICD-10-CM

## 2018-11-24 DIAGNOSIS — J441 Chronic obstructive pulmonary disease with (acute) exacerbation: Secondary | ICD-10-CM | POA: Diagnosis not present

## 2019-01-22 ENCOUNTER — Other Ambulatory Visit: Payer: Self-pay | Admitting: Family Medicine

## 2019-01-22 DIAGNOSIS — J441 Chronic obstructive pulmonary disease with (acute) exacerbation: Secondary | ICD-10-CM

## 2019-01-22 MED ORDER — SYMBICORT 160-4.5 MCG/ACT IN AERO: 2.0000 | INHALATION_SPRAY | Freq: Two times a day (BID) | RESPIRATORY_TRACT | 0 refills | Status: DC

## 2019-01-22 NOTE — Telephone Encounter (Signed)
SYMBICORT 160-4.5 MCG/ACT inhaler  triamcinolone cream (KENALOG) 0.1 %      Patient is requesting refill.     [place] [place] - [address] Phone:  [phone]  Fax:  [phone]

## 2019-02-08 DIAGNOSIS — J069 Acute upper respiratory infection, unspecified: Secondary | ICD-10-CM | POA: Diagnosis not present

## 2019-02-10 ENCOUNTER — Other Ambulatory Visit: Payer: Self-pay

## 2019-02-10 ENCOUNTER — Emergency Department: Payer: PPO

## 2019-02-10 DIAGNOSIS — Z79899 Other long term (current) drug therapy: Secondary | ICD-10-CM | POA: Insufficient documentation

## 2019-02-10 DIAGNOSIS — R05 Cough: Secondary | ICD-10-CM | POA: Diagnosis not present

## 2019-02-10 DIAGNOSIS — N183 Chronic kidney disease, stage 3 unspecified: Secondary | ICD-10-CM | POA: Diagnosis not present

## 2019-02-10 DIAGNOSIS — Z87891 Personal history of nicotine dependence: Secondary | ICD-10-CM | POA: Diagnosis not present

## 2019-02-10 DIAGNOSIS — R0600 Dyspnea, unspecified: Secondary | ICD-10-CM | POA: Diagnosis not present

## 2019-02-10 DIAGNOSIS — J069 Acute upper respiratory infection, unspecified: Secondary | ICD-10-CM | POA: Diagnosis not present

## 2019-02-10 DIAGNOSIS — Z20822 Contact with and (suspected) exposure to covid-19: Secondary | ICD-10-CM | POA: Insufficient documentation

## 2019-02-10 DIAGNOSIS — Z7982 Long term (current) use of aspirin: Secondary | ICD-10-CM | POA: Diagnosis not present

## 2019-02-10 DIAGNOSIS — R079 Chest pain, unspecified: Secondary | ICD-10-CM | POA: Diagnosis present

## 2019-02-10 DIAGNOSIS — J449 Chronic obstructive pulmonary disease, unspecified: Secondary | ICD-10-CM | POA: Diagnosis not present

## 2019-02-10 DIAGNOSIS — I129 Hypertensive chronic kidney disease with stage 1 through stage 4 chronic kidney disease, or unspecified chronic kidney disease: Secondary | ICD-10-CM | POA: Diagnosis not present

## 2019-02-10 DIAGNOSIS — R0602 Shortness of breath: Secondary | ICD-10-CM | POA: Diagnosis not present

## 2019-02-10 LAB — BASIC METABOLIC PANEL
Anion gap: 9 (ref 5–15)
BUN: 27 mg/dL — ABNORMAL HIGH (ref 8–23)
CO2: 26 mmol/L (ref 22–32)
Calcium: 8.9 mg/dL (ref 8.9–10.3)
Chloride: 100 mmol/L (ref 98–111)
Creatinine, Ser: 1.14 mg/dL — ABNORMAL HIGH (ref 0.44–1.00)
GFR calc Af Amer: 56 mL/min — ABNORMAL LOW (ref >60)
GFR calc non Af Amer: 48 mL/min — ABNORMAL LOW (ref >60)
Glucose, Bld: 117 mg/dL — ABNORMAL HIGH (ref 70–99)
Potassium: 3.4 mmol/L — ABNORMAL LOW (ref 3.5–5.1)
Sodium: 135 mmol/L (ref 135–145)

## 2019-02-10 LAB — CBC WITH DIFFERENTIAL/PLATELET
Abs Immature Granulocytes: 0.04 10*3/uL (ref 0.00–0.07)
Basophils Absolute: 0.1 10*3/uL (ref 0.0–0.1)
Basophils Relative: 1 %
Eosinophils Absolute: 0.1 10*3/uL (ref 0.0–0.5)
Eosinophils Relative: 1 %
HCT: 30.2 % — ABNORMAL LOW (ref 36.0–46.0)
Hemoglobin: 9.1 g/dL — ABNORMAL LOW (ref 12.0–15.0)
Immature Granulocytes: 1 %
Lymphocytes Relative: 31 %
Lymphs Abs: 2.6 10*3/uL (ref 0.7–4.0)
MCH: 25 pg — ABNORMAL LOW (ref 26.0–34.0)
MCHC: 30.1 g/dL (ref 30.0–36.0)
MCV: 83 fL (ref 80.0–100.0)
Monocytes Absolute: 0.5 10*3/uL (ref 0.1–1.0)
Monocytes Relative: 6 %
Neutro Abs: 5.1 10*3/uL (ref 1.7–7.7)
Neutrophils Relative %: 60 %
Platelets: 472 10*3/uL — ABNORMAL HIGH (ref 150–400)
RBC: 3.64 MIL/uL — ABNORMAL LOW (ref 3.87–5.11)
RDW: 15.2 % (ref 11.5–15.5)
WBC: 8.4 10*3/uL (ref 4.0–10.5)
nRBC: 0 % (ref 0.0–0.2)

## 2019-02-10 LAB — TROPONIN I (HIGH SENSITIVITY)
Troponin I (High Sensitivity): 7 ng/L (ref <18)
Troponin I (High Sensitivity): 9 ng/L (ref <18)

## 2019-02-10 NOTE — ED Triage Notes (Signed)
Pt c/o cough with sinus and chest congestion with SOB and chest tightness for the past couple of weeks, pt is in NAD. Pt was seen at the urgent care on Thursday and rx Augmentin, states she is not feeling and better.

## 2019-02-11 ENCOUNTER — Emergency Department
Admission: EM | Admit: 2019-02-11 | Discharge: 2019-02-11 | Disposition: A | Discharge status: Home / Self Care | Payer: PPO | Attending: Emergency Medicine | Admitting: Emergency Medicine

## 2019-02-11 DIAGNOSIS — R0602 Shortness of breath: Secondary | ICD-10-CM

## 2019-02-11 DIAGNOSIS — J069 Acute upper respiratory infection, unspecified: Secondary | ICD-10-CM

## 2019-02-11 LAB — SARS CORONAVIRUS 2 (TAT 6-24 HRS): SARS Coronavirus 2: NEGATIVE

## 2019-02-11 MED ORDER — GUAIFENESIN ER 600 MG PO TB12: 600.0000 mg | ORAL_TABLET | Freq: Two times a day (BID) | ORAL | 0 refills | Status: AC

## 2019-02-11 MED ORDER — GUAIFENESIN ER 600 MG PO TB12
600.0000 mg | ORAL_TABLET | Freq: Two times a day (BID) | ORAL | Status: DC
  Administered 2019-02-11: 02:00:00 600 mg via ORAL
  Filled 2019-02-11: qty 1

## 2019-02-11 NOTE — ED Provider Notes (Signed)
[facility] Emergency Department Provider Note  Time seen: 1:43 AM  I have reviewed the triage vital signs and the nursing notes.   HISTORY  Chief Complaint Chest Pain and Shortness of Breath   HPI Colleen [name] is a 74 y.o. female with a past medical history of COPD, hypertension, hyperlipidemia presents to the emergency department for some mild shortness of breath and cough.  According to the patient she has had a runny nose for many weeks, states she recently saw her doctor who prescribed her Augmentin for possible sinus infection.  She states however over the past several days she has become mildly short of breath and has been experiencing a cough with sputum production.  Patient denies any fever.  No nausea vomiting or diarrhea.  Denies any chest pain, but states she feels like she has phlegm in her chest.  Past Medical History:  Diagnosis Date  . Asthma   . Cataract   . COPD (chronic obstructive pulmonary disease) ([place])   . GERD (gastroesophageal reflux disease)   . Gout   . Hyperlipidemia   . Hypertension   . Osteoarthrosis   . Prediabetes     Patient Active Problem List   Diagnosis Date Noted  . Atherosclerosis of both carotid arteries 09/20/2018  . Spinal stenosis of lumbar region with neurogenic claudication 09/20/2018  . Near syncope 09/10/2018  . Aortic atherosclerosis ([place]) 09/05/2018  . At high risk for falls 09/05/2018  . Morbid obesity with BMI of 45.0-49.9, adult ([place]) 08/29/2017  . Osteopenia 05/18/2017  . Chronic kidney disease, stage III (moderate) 04/21/2017  . Morbid obesity ([place]) 04/21/2017  . Acquired trigger finger 02/28/2017  . Chronic gouty arthropathy without tophi 11/29/2016  . Hypergammaglobulinemia 11/29/2016  . Osteoarthritis of both knees 11/29/2016  . Allergic rhinitis, seasonal 07/30/2014  . HLD (hyperlipidemia) 07/30/2014  . Osteoarthritis of knee 07/30/2014  . Prediabetes 07/30/2014  . Asthma, mild persistent  07/30/2014  . COPD (chronic obstructive pulmonary disease) ([place]) 07/09/2008  . Benign essential HTN 06/22/2006    Past Surgical History:  Procedure Laterality Date  . BREAST EXCISIONAL BIOPSY Left 04/08/2014   Intraductal papilloma-5 mm  . LUMBAR LAMINECTOMY    . TONSILLECTOMY      Prior to Admission medications   Medication Sig Start Date End Date Taking? Authorizing Provider  allopurinol (ZYLOPRIM) 100 MG tablet Take 1.5 tablets (150 mg total) by mouth daily. 09/05/18 12/04/18  Poulose, Bethel Born, NP  aspirin EC 81 MG tablet Take 81 mg by mouth daily.    [provider]  Blood Pressure Monitoring (BLOOD PRESSURE KIT) DEVI 1 Device by Does not apply route daily. Fluctuating blood pressure; please get appropriate size, large cuff 02/03/18   Lada, Satira Anis, MD  enalapril (VASOTEC) 10 MG tablet Take 1 tablet (10 mg total) by mouth daily. 09/05/18   Poulose, Bethel Born, NP  famotidine (PEPCID) 20 MG tablet Take 1 tablet (20 mg total) by mouth at bedtime. This is to replace protonix. 09/05/18   Poulose, Bethel Born, NP  hydrochlorothiazide (MICROZIDE) 12.5 MG capsule Take 1 capsule (12.5 mg total) by mouth daily. 09/05/18   Poulose, Bethel Born, NP  lovastatin (MEVACOR) 40 MG tablet Take 1 tablet (40 mg total) by mouth at bedtime. 09/05/18   Poulose, Bethel Born, NP  PROAIR HFA 108 [phone] Base) MCG/ACT inhaler INHALE 2 PUFFS INTO LUNGS EVERY 4 HOURS AS NEEDED FOR WHEEZING FOR SHORTNESS OF BREATH 01/22/19   Delsa Grana, PA-C  simethicone (GAS-X)  80 MG chewable tablet Chew 1 tablet (80 mg total) by mouth 2 (two) times a day. 08/02/18 10/31/18  Harvest Dark, MD  SYMBICORT 160-4.5 MCG/ACT inhaler Inhale 2 puffs into the lungs 2 (two) times daily. 01/22/19   Delsa Grana, PA-C  triamcinolone cream (KENALOG) 0.1 % APPLY CREAM EXTERNALLY TO AFFECTED AREA TWICE DAILY if needed; not more than one week at a time, then take a break for two weeks 05/30/18   Hubbard Hartshorn, FNP  Vitamin D,  Cholecalciferol, 25 MCG (1000 UT) TABS Take 1 tablet by mouth daily.     [provider]    No Known Allergies  Family History  Problem Relation Age of Onset  . Diabetes Mother   . Lung cancer Father   . Diabetes Sister   . Congestive Heart Failure Sister   . Glaucoma Sister   . Breast cancer Neg Hx     Social History Social History   Tobacco Use  . Smoking status: Former Smoker    Packs/day: 1.50    Years: 20.00    Pack years: 30.00    Types: Cigarettes    Quit date: 11/13/2008    Years since quitting: 10.2  . Smokeless tobacco: Never Used  . Tobacco comment: smoking cessation materials not required  Substance Use Topics  . Alcohol use: Not Currently    Alcohol/week: 1.0 standard drinks    Types: 1 Cans of beer per week  . Drug use: No    Review of Systems Constitutional: Negative for fever. ENT: Positive for nasal discharge Cardiovascular: Negative for chest pain. Respiratory: Mild shortness of breath.  Positive for cough with sputum production at times Gastrointestinal: Negative for abdominal pain, vomiting and diarrhea. Musculoskeletal: Negative for musculoskeletal complaints Neurological: Negative for headache All other ROS negative  ____________________________________________   PHYSICAL EXAM:  VITAL SIGNS: ED Triage Vitals  Enc Vitals Group     BP 02/10/19 1420 (!) 115/47     Pulse Rate 02/10/19 1420 (!) 101     Resp 02/10/19 1420 18     Temp 02/10/19 1420 98.1 F (36.7 C)     Temp Source 02/10/19 1420 Oral     SpO2 02/10/19 1420 100 %     Weight 02/10/19 1421 274 lb (124.3 kg)     Height 02/10/19 1421 '5\' 5"'  (1.651 m)     Head Circumference --      Peak Flow --      Pain Score 02/10/19 1421 0     Pain Loc --      Pain Edu? --      Excl. in [place]? --    Constitutional: Alert and oriented. Well appearing and in no distress. Eyes: Normal exam ENT      Head: Normocephalic and atraumatic.      Mouth/Throat: Mucous membranes are  moist. Cardiovascular: Normal rate, regular rhythm. No murmur Respiratory: Normal respiratory effort without tachypnea nor retractions. Breath sounds are clear.  Occasional cough during exam. Gastrointestinal: Soft and nontender. No distention.   Musculoskeletal: Nontender with normal range of motion in all extremities.  Neurologic:  Normal speech and language. No gross focal neurologic deficits  Skin:  Skin is warm, dry and intact.  Psychiatric: Mood and affect are normal.   ____________________________________________    EKG  EKG viewed and interpreted by myself shows sinus tachycardia 105 bpm with a narrow QRS, normal axis, normal intervals, no concerning ST changes.  ____________________________________________    RADIOLOGY  Chest x-ray negative  ____________________________________________   INITIAL IMPRESSION / ASSESSMENT AND PLAN / ED COURSE  Pertinent labs & imaging results that were available during my care of the patient were reviewed by me and considered in my medical decision making (see chart for details).   Patient presents to the emergency department for chest congestion and mild shortness of breath.  Overall the patient appears well, no distress.  Patient has clear lung sounds on exam.  Patient denies any chest pain.  Patient's work-up is overall reassuring as well, lab work largely within normal limits including 2 - troponins.  Chest x-ray is normal.  EKG reassuring.  Patient does have an occasional cough with sputum production.  Patient is currently taking Augmentin for a sinus infection, we will add guaifenesin.  We will also perform a Covid swab for the patient.  Patient states she has been very safe only goes out to the grocery store and wears a mask face shield and gloves when she does so.  Overall patient appears very well and safe for discharge with PCP follow-up.  Colleen [name] was evaluated in Emergency Department on 02/11/2019 for the symptoms described in  the history of present illness. She was evaluated in the context of the global COVID-19 pandemic, which necessitated consideration that the patient might be at risk for infection with the SARS-CoV-2 virus that causes COVID-19. Institutional protocols and algorithms that pertain to the evaluation of patients at risk for COVID-19 are in a state of rapid change based on information released by regulatory bodies including the CDC and federal and state organizations. These policies and algorithms were followed during the patient's care in the ED.  ____________________________________________   FINAL CLINICAL IMPRESSION(S) / ED DIAGNOSES  Dyspnea URI   Harvest Dark, MD 02/11/19 0147

## 2019-02-11 NOTE — ED Notes (Signed)
No peripheral IV placed this visit.   Discharge instructions reviewed with patient. Questions fielded by this RN. Patient verbalizes understanding of instructions. Patient discharged home in stable condition per paduchowski. No acute distress noted at time of discharge.    Pt wheeled through parking lot to get to her car, pt and walker loaded

## 2019-02-11 NOTE — ED Notes (Signed)
ED Provider at bedside.  Pt reports wheezing and SOB for the last 2 days, c/o of thick phlegm, recent visit to PCP for sinusitis and pt taking Augmentin for same

## 2019-03-06 ENCOUNTER — Ambulatory Visit (INDEPENDENT_AMBULATORY_CARE_PROVIDER_SITE_OTHER): Payer: PPO | Admitting: Family Medicine

## 2019-03-06 ENCOUNTER — Encounter: Payer: Self-pay | Admitting: Family Medicine

## 2019-03-06 ENCOUNTER — Other Ambulatory Visit: Payer: Self-pay

## 2019-03-06 VITALS — BP 130/68 | HR 102 | Temp 97.9°F | Resp 14 | Ht 65.0 in | Wt 295.0 lb

## 2019-03-06 DIAGNOSIS — E782 Mixed hyperlipidemia: Secondary | ICD-10-CM

## 2019-03-06 DIAGNOSIS — L309 Dermatitis, unspecified: Secondary | ICD-10-CM

## 2019-03-06 DIAGNOSIS — M1A00X0 Idiopathic chronic gout, unspecified site, without tophus (tophi): Secondary | ICD-10-CM

## 2019-03-06 DIAGNOSIS — R002 Palpitations: Secondary | ICD-10-CM | POA: Diagnosis not present

## 2019-03-06 DIAGNOSIS — M109 Gout, unspecified: Secondary | ICD-10-CM

## 2019-03-06 DIAGNOSIS — R7303 Prediabetes: Secondary | ICD-10-CM

## 2019-03-06 DIAGNOSIS — I499 Cardiac arrhythmia, unspecified: Secondary | ICD-10-CM

## 2019-03-06 DIAGNOSIS — I700 Atherosclerosis of aorta: Secondary | ICD-10-CM

## 2019-03-06 DIAGNOSIS — D649 Anemia, unspecified: Secondary | ICD-10-CM

## 2019-03-06 DIAGNOSIS — R0609 Other forms of dyspnea: Secondary | ICD-10-CM

## 2019-03-06 DIAGNOSIS — R0600 Dyspnea, unspecified: Secondary | ICD-10-CM | POA: Diagnosis not present

## 2019-03-06 DIAGNOSIS — J441 Chronic obstructive pulmonary disease with (acute) exacerbation: Secondary | ICD-10-CM

## 2019-03-06 DIAGNOSIS — N1831 Chronic kidney disease, stage 3a: Secondary | ICD-10-CM

## 2019-03-06 DIAGNOSIS — I10 Essential (primary) hypertension: Secondary | ICD-10-CM

## 2019-03-06 DIAGNOSIS — Z6841 Body Mass Index (BMI) 40.0 and over, adult: Secondary | ICD-10-CM

## 2019-03-06 DIAGNOSIS — Z09 Encounter for follow-up examination after completed treatment for conditions other than malignant neoplasm: Secondary | ICD-10-CM | POA: Diagnosis not present

## 2019-03-06 MED ORDER — TRIAMCINOLONE ACETONIDE 0.1 % EX CREA: TOPICAL_CREAM | CUTANEOUS | 0 refills | Status: DC

## 2019-03-06 MED ORDER — ENALAPRIL MALEATE 10 MG PO TABS: 10.0000 mg | ORAL_TABLET | Freq: Every day | ORAL | 1 refills | Status: DC

## 2019-03-06 MED ORDER — HYDROCHLOROTHIAZIDE 12.5 MG PO CAPS: 12.5000 mg | ORAL_CAPSULE | Freq: Every day | ORAL | 1 refills | Status: DC

## 2019-03-06 NOTE — Progress Notes (Signed)
Name: Colleen [name]   MRN: [id number]    DOB: September 01, 1945   Date:03/06/2019       Progress Note  Chief Complaint  Patient presents with  . Follow-up  . Hypertension  . Hyperlipidemia  . Gastroesophageal Reflux  . Medication Refill    cream for recurrent rash on face     Subjective:   Colleen [name] is a 74 y.o. female, presents to clinic for routine follow up on the conditions listed above.  Hypertension:  Currently managed on enalapril and HCTZ  Pt reports  med compliance and denies any SE.  No lightheadedness, hypotension, syncope. Blood pressure today is well controlled.  BP Readings from Last 3 Encounters:  03/06/19 130/68  02/11/19 (!) 165/43  09/20/18 133/79  Pt denies CP, SOB, exertional sx, LE edema, palpitation, Ha's, visual disturbances  COPD- cold weather triggers worse breathing, est winter weather and temps 20-40.  She is using symbicort BID and proair once in the morning and carries one with her for rescue, rarely uses.  She feels like over a year ago her breathing started to gradually get worse and she says that's when her breathing and COPD got worse.  HLD-  On lovastatin, compliant with medication, no myalgias side effects or concerns.  She does eat whatever she wants.   Last lipids well controlled Lab Results  Component Value Date   CHOL 175 09/05/2018   HDL 70 09/05/2018   LDLCALC 89 09/05/2018   TRIG 69 09/05/2018   CHOLHDL 2.5 09/05/2018  - Denies: Chest pain, shortness of breath, myalgias. - but she did have recent ER visit with CP r/o work up - Documented aortic atherosclerosis? Yes - Risk factors for atherosclerosis: hypercholesterolemia and hypertension   Anemia in recent ER visit Hgb 9 down from 11 and 12 from last August  Patient denies any abdominal pain, acid reflux indigestion, melena, hematochezia, no hematuria easy or spontaneous bruising  HR slightly elevated and irregular today -reviewed past EKGs which shows history of sinus tach  with PVCs from the ER visit in January less than a month ago and August 2020 patient had sinus rhythm.  Chest x-ray from ER visit February 10, 2019 showed mild cardiomegaly and no active or acute cardiopulmonary disease.  ER visit was originally for URI symptoms mild cough and shortness of breath with history of COPD. Abnormal labs from recent ER visit include decreased hemoglobin down to 9.1 from most recent labs in September 10, 2018 hemoglobin is 11.6 with normal indices, ER visit showed normocytic anemia with mild thrombocytosis normal MCV normal RDW. Patient had 2 normal high-sensitivity troponin test while she was there.  Patient does endorse a gradual onset and worsening of exertional dyspnea over the past year to year and a half.  She does note some brief intermittent palpitations that come and go she denies any syncopal or near syncopal episodes, denies orthopnea, lower extremity edema, PND. Patient denies ever seeing a cardiologist.  She did have carotid ultrasounds when admitted to the hospital about 6 months ago and many years ago had a lower extremity duplex otherwise no cardiovascular testing other than EKGs done are available in the chart.    Patient Active Problem List   Diagnosis Date Noted  . Atherosclerosis of both carotid arteries 09/20/2018  . Spinal stenosis of lumbar region with neurogenic claudication 09/20/2018  . Near syncope 09/10/2018  . Aortic atherosclerosis ([place]) 09/05/2018  . At high risk for falls 09/05/2018  . Morbid obesity with  BMI of 45.0-49.9, adult ([place]) 08/29/2017  . Osteopenia 05/18/2017  . Chronic kidney disease, stage III (moderate) 04/21/2017  . Morbid obesity ([place]) 04/21/2017  . Acquired trigger finger 02/28/2017  . Chronic gouty arthropathy without tophi 11/29/2016  . Hypergammaglobulinemia 11/29/2016  . Osteoarthritis of both knees 11/29/2016  . Allergic rhinitis, seasonal 07/30/2014  . HLD (hyperlipidemia) 07/30/2014  . Osteoarthritis of knee  07/30/2014  . Prediabetes 07/30/2014  . Asthma, mild persistent 07/30/2014  . COPD (chronic obstructive pulmonary disease) ([place]) 07/09/2008  . Benign essential HTN 06/22/2006    Past Surgical History:  Procedure Laterality Date  . BREAST EXCISIONAL BIOPSY Left 04/08/2014   Intraductal papilloma-5 mm  . LUMBAR LAMINECTOMY    . TONSILLECTOMY      Family History  Problem Relation Age of Onset  . Diabetes Mother   . Lung cancer Father   . Diabetes Sister   . Congestive Heart Failure Sister   . Glaucoma Sister   . Breast cancer Neg Hx     Social History   Socioeconomic History  . Marital status: Widowed    Spouse name: Titus Dubin  . Number of children: 0  . Years of education: Not on file  . Highest education level: 12th grade  Occupational History  . Occupation: Retired  Tobacco Use  . Smoking status: Former Smoker    Packs/day: 1.50    Years: 20.00    Pack years: 30.00    Types: Cigarettes    Quit date: 11/13/2008    Years since quitting: 10.3  . Smokeless tobacco: Never Used  . Tobacco comment: smoking cessation materials not required  Substance and Sexual Activity  . Alcohol use: Not Currently    Alcohol/week: 1.0 standard drinks    Types: 1 Cans of beer per week  . Drug use: No  . Sexual activity: Not Currently  Other Topics Concern  . Not on file  Social History Narrative  . Not on file   Social Determinants of Health   Financial Resource Strain:   . Difficulty of Paying Living Expenses: Not on file  Food Insecurity:   . Worried About Charity fundraiser in the Last Year: Not on file  . Ran Out of Food in the Last Year: Not on file  Transportation Needs:   . Lack of Transportation (Medical): Not on file  . Lack of Transportation (Non-Medical): Not on file  Physical Activity:   . Days of Exercise per Week: Not on file  . Minutes of Exercise per Session: Not on file  Stress:   . Feeling of Stress : Not on file  Social Connections:   . Frequency of  Communication with Friends and Family: Not on file  . Frequency of Social Gatherings with Friends and Family: Not on file  . Attends Religious Services: Not on file  . Active Member of Clubs or Organizations: Not on file  . Attends Archivist Meetings: Not on file  . Marital Status: Not on file  Intimate Partner Violence:   . Fear of Current or Ex-Partner: Not on file  . Emotionally Abused: Not on file  . Physically Abused: Not on file  . Sexually Abused: Not on file     Current Outpatient Medications:  .  aspirin EC 81 MG tablet, Take 81 mg by mouth daily., Disp: , Rfl:  .  Blood Pressure Monitoring (BLOOD PRESSURE KIT) DEVI, 1 Device by Does not apply route daily. Fluctuating blood pressure; please get appropriate size, large cuff,  Disp: 1 Device, Rfl: 0 .  enalapril (VASOTEC) 10 MG tablet, Take 1 tablet (10 mg total) by mouth daily., Disp: 90 tablet, Rfl: 1 .  famotidine (PEPCID) 20 MG tablet, Take 1 tablet (20 mg total) by mouth at bedtime. This is to replace protonix., Disp: 90 tablet, Rfl: 1 .  hydrochlorothiazide (MICROZIDE) 12.5 MG capsule, Take 1 capsule (12.5 mg total) by mouth daily., Disp: 90 capsule, Rfl: 1 .  lovastatin (MEVACOR) 40 MG tablet, Take 1 tablet (40 mg total) by mouth at bedtime., Disp: 90 tablet, Rfl: 1 .  PROAIR HFA 108 (90 Base) MCG/ACT inhaler, INHALE 2 PUFFS INTO LUNGS EVERY 4 HOURS AS NEEDED FOR WHEEZING FOR SHORTNESS OF BREATH, Disp: 18 g, Rfl: 0 .  SYMBICORT 160-4.5 MCG/ACT inhaler, Inhale 2 puffs into the lungs 2 (two) times daily., Disp: 33 g, Rfl: 0 .  triamcinolone cream (KENALOG) 0.1 %, APPLY CREAM EXTERNALLY TO AFFECTED AREA TWICE DAILY if needed; not more than one week at a time, then take a break for two weeks, Disp: 80 g, Rfl: 0 .  Vitamin D, Cholecalciferol, 25 MCG (1000 UT) TABS, Take 1 tablet by mouth daily. , Disp: , Rfl:  .  allopurinol (ZYLOPRIM) 100 MG tablet, Take 1.5 tablets (150 mg total) by mouth daily., Disp: 135 tablet, Rfl:  1 .  simethicone (GAS-X) 80 MG chewable tablet, Chew 1 tablet (80 mg total) by mouth 2 (two) times a day., Disp: 60 tablet, Rfl: 2  No Known Allergies  Chart Review Today: I personally reviewed active problem list, medication list, allergies, family history, social history, health maintenance, notes from last encounter, lab results, imaging with the patient/caregiver today.   Review of Systems  Constitutional: Negative.   HENT: Negative.   Eyes: Negative.   Respiratory: Negative.   Cardiovascular: Negative.   Gastrointestinal: Negative.   Endocrine: Negative.   Genitourinary: Negative.   Musculoskeletal: Negative.   Skin: Negative.   Allergic/Immunologic: Negative.   Neurological: Negative.   Hematological: Negative.   Psychiatric/Behavioral: Negative.    10 Systems reviewed and are negative for acute change except as noted in the HPI.   Objective:    Vitals:   03/06/19 0838  BP: 130/68  Pulse: (!) 102  Resp: 14  Temp: 97.9 F (36.6 C)  SpO2: 96%  Weight: 295 lb (133.8 kg)  Height: '5\' 5"'  (1.651 m)    Body mass index is 49.09 kg/m.  Physical Exam Vitals and nursing note reviewed.  Constitutional:      General: She is not in acute distress.    Appearance: Normal appearance. She is well-developed. She is obese. She is not ill-appearing, toxic-appearing or diaphoretic.     Interventions: Face mask in place.  HENT:     Head: Normocephalic and atraumatic.     Right Ear: External ear normal.     Left Ear: External ear normal.  Eyes:     General: Lids are normal. No scleral icterus.       Right eye: No discharge.        Left eye: No discharge.     Conjunctiva/sclera: Conjunctivae normal.  Neck:     Trachea: Phonation normal. No tracheal deviation.  Cardiovascular:     Rate and Rhythm: Tachycardia present. Rhythm irregular.     Chest Wall: PMI is not displaced. No thrill.     Pulses: Normal pulses.          Radial pulses are 2+ on the right side and 2+ on the  left side.     Heart sounds: Normal heart sounds. Heart sounds not distant. No murmur. No friction rub. No gallop.      Comments: Mild b/l le edema, non-pitting Pulmonary:     Effort: Pulmonary effort is normal. No respiratory distress.     Breath sounds: Normal breath sounds. No stridor. No wheezing, rhonchi or rales.  Chest:     Chest wall: No tenderness.  Abdominal:     General: Bowel sounds are normal. There is no distension.     Palpations: Abdomen is soft.     Tenderness: There is no abdominal tenderness. There is no guarding or rebound.  Musculoskeletal:        General: No deformity. Normal range of motion.     Cervical back: Normal range of motion and neck supple.     Right lower leg: Edema present.     Left lower leg: Edema present.  Lymphadenopathy:     Cervical: No cervical adenopathy.  Skin:    General: Skin is warm and dry.     Capillary Refill: Capillary refill takes less than 2 seconds.     Coloration: Skin is not jaundiced or pale.     Findings: No rash.  Neurological:     Mental Status: She is alert. Mental status is at baseline.     Sensory: No sensory deficit.     Motor: No abnormal muscle tone.     Gait: Gait abnormal.  Psychiatric:        Mood and Affect: Mood normal.        Speech: Speech normal.        Behavior: Behavior normal.      ECG interpretation   Date: 03/06/19  Rate: 80  Rhythm: sinus rhythm with frequent PACs (difficult to march out p-waves with low amplitude and frequent PACs)   QRS Axis: normal  Intervals: normal  ST/T Wave abnormalities: no ST elevation or depression, non-specific t-wave inferior leads  Conduction Disutrbances: low voltage/amplitude, poor R wave progression    Old EKG Reviewed:  Compared to 02/10/2019 (sinus tach with PVCs) and 09/10/2018 NSR      PHQ2/9: Depression screen [facility] 2/9 03/06/2019 09/20/2018 09/05/2018 07/31/2018 03/07/2018  Decreased Interest 0 0 0 0 0  Down, Depressed, Hopeless 0 0 0 0 0  PHQ - 2 Score 0  0 0 0 0  Altered sleeping 0 0 0 - 0  Tired, decreased energy 0 0 0 - 0  Change in appetite 0 0 0 - 0  Feeling bad or failure about yourself  0 0 0 - 0  Trouble concentrating 0 0 0 - 0  Moving slowly or fidgety/restless 0 0 0 - 0  Suicidal thoughts 0 0 0 - 0  PHQ-9 Score 0 0 0 - 0  Difficult doing work/chores Not difficult at all Not difficult at all Not difficult at all - Not difficult at all  Some recent data might be hidden    phq 9 is neg, reviewed today  Fall Risk: Fall Risk  03/06/2019 09/20/2018 09/05/2018 07/31/2018 03/07/2018  Falls in the past year? '1 1 1 ' 0 1  Number falls in past yr: '1 1 1 ' - 0  Injury with Fall? '1 1 1 ' - 0  Comment - - head contusion - -  Risk for fall due to : - Impaired balance/gait;Impaired mobility;History of fall(s) Impaired balance/gait;Impaired mobility;History of fall(s) - -  Risk for fall due to: Comment - - - - -  Follow  up - - Falls prevention discussed - -    Functional Status Survey: Is the patient deaf or have difficulty hearing?: No Does the patient have difficulty seeing, even when wearing glasses/contacts?: No Does the patient have difficulty concentrating, remembering, or making decisions?: No Does the patient have difficulty walking or climbing stairs?: No Does the patient have difficulty dressing or bathing?: No Does the patient have difficulty doing errands alone such as visiting a doctor's office or shopping?: No   Assessment & Plan:     ICD-10-CM   1. Anemia, unspecified type  D64.9 CBC (INCLUDES DIFF/PLT) WITH PATHOLOGIST REVIEW    Iron, TIBC and Ferritin Panel   new anemia, pt denies melena, some GERD sx r/o GI blood loss with stool cards, recheck labs, may be contributing to pts palpitations and Dyspnea?   2. Dermatitis of face  L30.9 triamcinolone cream (KENALOG) 0.1 %   refill on her topical steroid she has used to years long term - instructed her to use only very sparingly and warned of topical steroid use skin changes    3.  Benign essential HTN  I10 enalapril (VASOTEC) 10 MG tablet    hydrochlorothiazide (MICROZIDE) 12.5 MG capsule    COMPLETE METABOLIC PANEL WITH GFR   well controlled with current meds  4. Chronic obstructive pulmonary disease with acute exacerbation (HCC)  J44.1    new dx with recent COPD exacerbation, pt not taking inhalers as prescribed, using SABA daily, worse baseline exertional SOB than one year ago    5. Stage 3a chronic kidney disease  X45.85 COMPLETE METABOLIC PANEL WITH GFR   recheck renal function  6. Prediabetes  F29.24 COMPLETE METABOLIC PANEL WITH GFR    Hemoglobin A1c   recheck A1C  7. Mixed hyperlipidemia  M62.8 COMPLETE METABOLIC PANEL WITH GFR    Lipid panel   has been stable and well controlled on meds w/o SE or concerns, no myalgias  8. Gout, unspecified cause, unspecified chronicity, unspecified site  M38.1 COMPLETE METABOLIC PANEL WITH GFR    Uric acid   some dose changes in the past year, no recent flare, recheck uric acid level to monitor  9. Irregular heart rate  I49.9 EKG 12-Lead    TSH   PAC's frequent  10. Palpitations  R71.1 COMPLETE METABOLIC PANEL WITH GFR    EKG 12-Lead    CBC (INCLUDES DIFF/PLT) WITH PATHOLOGIST REVIEW    TSH  11. Encounter for examination following treatment at hospital  Z09    reviewed ER visit, notes, all imaging, lab results and EKG    12. DOE (dyspnea on exertion)  R06.00    unclear COPD hx, but pt notes exertional dyspnea over past 1-2 years, mild cardiomegaly, palpitations and some ekg changes freq PACs I will be working up anemia and other causes of dyspnea and palpitations but I do feel she has enough cardiac symptoms and comorbidities to warrant a cardiac consult for further evaluation of possible HF, currently appears euvolemic.     13. Aortic atherosclerosis (HCC) Chronic I70.0    on statin, monitoring  14. Morbid obesity with BMI of 45.0-49.9, adult ([place]) Chronic E66.01   Encouraged her to work on healthy diet, mobility  is limited  - defer further discussion until next f/up visit Z68.42     Pt given stool cards, will return them in the next week. I explained to the pt that she may need to return sooner to address any abnormal labs or findings.  Return in about  3 months (around 06/03/2019) for Routine follow-up.    Delsa Grana, PA-C 03/06/19 8:52 AM

## 2019-03-07 DIAGNOSIS — D638 Anemia in other chronic diseases classified elsewhere: Secondary | ICD-10-CM | POA: Insufficient documentation

## 2019-03-07 DIAGNOSIS — D649 Anemia, unspecified: Secondary | ICD-10-CM | POA: Insufficient documentation

## 2019-03-07 LAB — CBC (INCLUDES DIFF/PLT) WITH PATHOLOGIST REVIEW
Absolute Monocytes: 764 cells/uL (ref 200–950)
Basophils Absolute: 100 cells/uL (ref 0–200)
Basophils Relative: 1.1 %
Eosinophils Absolute: 173 cells/uL (ref 15–500)
Eosinophils Relative: 1.9 %
HCT: 29.5 % — ABNORMAL LOW (ref 35.0–45.0)
Hemoglobin: 9.2 g/dL — ABNORMAL LOW (ref 11.7–15.5)
Lymphs Abs: 2921 cells/uL (ref 850–3900)
MCH: 24.4 pg — ABNORMAL LOW (ref 27.0–33.0)
MCHC: 31.2 g/dL — ABNORMAL LOW (ref 32.0–36.0)
MCV: 78.2 fL — ABNORMAL LOW (ref 80.0–100.0)
MPV: 10 fL (ref 7.5–12.5)
Monocytes Relative: 8.4 %
Neutro Abs: 5142 cells/uL (ref 1500–7800)
Neutrophils Relative %: 56.5 %
Platelets: 488 10*3/uL — ABNORMAL HIGH (ref 140–400)
RBC: 3.77 10*6/uL — ABNORMAL LOW (ref 3.80–5.10)
RDW: 14 % (ref 11.0–15.0)
Total Lymphocyte: 32.1 %
WBC: 9.1 10*3/uL (ref 3.8–10.8)

## 2019-03-07 LAB — COMPLETE METABOLIC PANEL WITH GFR
AG Ratio: 0.9 (calc) — ABNORMAL LOW (ref 1.0–2.5)
ALT: 11 U/L (ref 6–29)
AST: 19 U/L (ref 10–35)
Albumin: 3.7 g/dL (ref 3.6–5.1)
Alkaline phosphatase (APISO): 99 U/L (ref 37–153)
BUN/Creatinine Ratio: 22 (calc) (ref 6–22)
BUN: 22 mg/dL (ref 7–25)
CO2: 29 mmol/L (ref 20–32)
Calcium: 9.7 mg/dL (ref 8.6–10.4)
Chloride: 100 mmol/L (ref 98–110)
Creat: 1.01 mg/dL — ABNORMAL HIGH (ref 0.60–0.93)
GFR, Est African American: 64 mL/min/{1.73_m2} (ref >=60)
GFR, Est Non African American: 56 mL/min/{1.73_m2} — ABNORMAL LOW (ref >=60)
Globulin: 3.9 g/dL (calc) — ABNORMAL HIGH (ref 1.9–3.7)
Glucose, Bld: 123 mg/dL — ABNORMAL HIGH (ref 65–99)
Potassium: 4 mmol/L (ref 3.5–5.3)
Sodium: 137 mmol/L (ref 135–146)
Total Bilirubin: 0.3 mg/dL (ref 0.2–1.2)
Total Protein: 7.6 g/dL (ref 6.1–8.1)

## 2019-03-07 LAB — IRON,TIBC AND FERRITIN PANEL
%SAT: 4 % (calc) — ABNORMAL LOW (ref 16–45)
Ferritin: 8 ng/mL — ABNORMAL LOW (ref 16–288)
Iron: 18 ug/dL — ABNORMAL LOW (ref 45–160)
TIBC: 406 mcg/dL (calc) (ref 250–450)

## 2019-03-07 LAB — URIC ACID: Uric Acid, Serum: 6.6 mg/dL (ref 2.5–7.0)

## 2019-03-07 LAB — LIPID PANEL
Cholesterol: 150 mg/dL (ref <200)
HDL: 72 mg/dL (ref >=50)
LDL Cholesterol (Calc): 64 mg/dL (calc)
Non-HDL Cholesterol (Calc): 78 mg/dL (calc) (ref <130)
Total CHOL/HDL Ratio: 2.1 (calc) (ref <5.0)
Triglycerides: 68 mg/dL (ref <150)

## 2019-03-07 LAB — TSH: TSH: 1.62 mIU/L (ref 0.40–4.50)

## 2019-03-07 LAB — HEMOGLOBIN A1C
Hgb A1c MFr Bld: 6 % of total Hgb — ABNORMAL HIGH (ref <5.7)
Mean Plasma Glucose: 126 (calc)
eAG (mmol/L): 7 (calc)

## 2019-03-07 MED ORDER — LOVASTATIN 40 MG PO TABS: 40.0000 mg | ORAL_TABLET | Freq: Every day | ORAL | 3 refills | Status: DC

## 2019-03-07 MED ORDER — ALLOPURINOL 100 MG PO TABS: 150.0000 mg | ORAL_TABLET | Freq: Every day | ORAL | 3 refills | Status: DC

## 2019-03-07 MED ORDER — SYMBICORT 160-4.5 MCG/ACT IN AERO: 2.0000 | INHALATION_SPRAY | Freq: Two times a day (BID) | RESPIRATORY_TRACT | 5 refills | Status: DC

## 2019-03-07 MED ORDER — PROAIR HFA 108 (90 BASE) MCG/ACT IN AERS: INHALATION_SPRAY | RESPIRATORY_TRACT | 5 refills | Status: DC

## 2019-03-09 ENCOUNTER — Ambulatory Visit: Payer: PPO | Admitting: Family Medicine

## 2019-03-13 ENCOUNTER — Other Ambulatory Visit (INDEPENDENT_AMBULATORY_CARE_PROVIDER_SITE_OTHER): Payer: PPO

## 2019-03-13 ENCOUNTER — Telehealth: Payer: Self-pay

## 2019-03-13 DIAGNOSIS — R0609 Other forms of dyspnea: Secondary | ICD-10-CM | POA: Insufficient documentation

## 2019-03-13 DIAGNOSIS — D509 Iron deficiency anemia, unspecified: Secondary | ICD-10-CM

## 2019-03-13 DIAGNOSIS — D649 Anemia, unspecified: Secondary | ICD-10-CM | POA: Diagnosis not present

## 2019-03-13 DIAGNOSIS — I491 Atrial premature depolarization: Secondary | ICD-10-CM

## 2019-03-13 DIAGNOSIS — I517 Cardiomegaly: Secondary | ICD-10-CM

## 2019-03-13 DIAGNOSIS — R002 Palpitations: Secondary | ICD-10-CM | POA: Insufficient documentation

## 2019-03-13 LAB — POC HEMOCCULT BLD/STL (HOME/3-CARD/SCREEN)
Card #2 Fecal Occult Blod, POC: NEGATIVE
Card #3 Fecal Occult Blood, POC: NEGATIVE
Fecal Occult Blood, POC: NEGATIVE

## 2019-03-13 MED ORDER — FERROUS SULFATE 325 (65 FE) MG PO TBEC: 325.0000 mg | DELAYED_RELEASE_TABLET | Freq: Every day | ORAL | 1 refills | Status: DC

## 2019-03-13 NOTE — Telephone Encounter (Signed)
Patient states she was not told about her palpitations and what to do?   Please advise?

## 2019-03-14 ENCOUNTER — Encounter: Payer: Self-pay | Admitting: *Deleted

## 2019-03-14 NOTE — Telephone Encounter (Signed)
Pt.notified

## 2019-03-23 DIAGNOSIS — Z87891 Personal history of nicotine dependence: Secondary | ICD-10-CM | POA: Diagnosis not present

## 2019-03-23 DIAGNOSIS — M4807 Spinal stenosis, lumbosacral region: Secondary | ICD-10-CM | POA: Diagnosis not present

## 2019-03-23 DIAGNOSIS — M5416 Radiculopathy, lumbar region: Secondary | ICD-10-CM | POA: Diagnosis not present

## 2019-03-23 DIAGNOSIS — M48061 Spinal stenosis, lumbar region without neurogenic claudication: Secondary | ICD-10-CM | POA: Diagnosis not present

## 2019-03-23 DIAGNOSIS — M5116 Intervertebral disc disorders with radiculopathy, lumbar region: Secondary | ICD-10-CM | POA: Diagnosis not present

## 2019-03-23 DIAGNOSIS — D649 Anemia, unspecified: Secondary | ICD-10-CM | POA: Diagnosis not present

## 2019-03-23 DIAGNOSIS — Z79899 Other long term (current) drug therapy: Secondary | ICD-10-CM | POA: Diagnosis not present

## 2019-03-30 ENCOUNTER — Ambulatory Visit (INDEPENDENT_AMBULATORY_CARE_PROVIDER_SITE_OTHER): Payer: PPO | Admitting: Cardiology

## 2019-03-30 ENCOUNTER — Other Ambulatory Visit: Payer: Self-pay

## 2019-03-30 ENCOUNTER — Encounter: Payer: Self-pay | Admitting: Cardiology

## 2019-03-30 VITALS — BP 138/62 | HR 92 | Ht 68.0 in | Wt 291.8 lb

## 2019-03-30 DIAGNOSIS — R0600 Dyspnea, unspecified: Secondary | ICD-10-CM

## 2019-03-30 DIAGNOSIS — I10 Essential (primary) hypertension: Secondary | ICD-10-CM | POA: Diagnosis not present

## 2019-03-30 DIAGNOSIS — I491 Atrial premature depolarization: Secondary | ICD-10-CM

## 2019-03-30 DIAGNOSIS — R0609 Other forms of dyspnea: Secondary | ICD-10-CM

## 2019-03-30 DIAGNOSIS — E7800 Pure hypercholesterolemia, unspecified: Secondary | ICD-10-CM | POA: Diagnosis not present

## 2019-03-30 NOTE — Patient Instructions (Addendum)
Medication Instructions:  Your physician recommends that you continue on your current medications as directed. Please refer to the Current Medication list given to you today.  *If you need a refill on your cardiac medications before your next appointment, please call your pharmacy*   Lab Work: None ordered If you have labs (blood work) drawn today and your tests are completely normal, you will receive your results only by: Marland Kitchen MyChart Message (if you have MyChart) OR . A paper copy in the mail If you have any lab test that is abnormal or we need to change your treatment, we will call you to review the results.   Testing/Procedures: Your physician has requested that you have an echocardiogram. Echocardiography is a painless test that uses sound waves to create images of your heart. It provides your doctor with information about the size and shape of your heart and how well your heart's chambers and valves are working. This procedure takes approximately one hour. There are no restrictions for this procedure.  Your physician has requested that you have a lexiscan myoview. For further information please visit [email or web address]. Please follow instruction sheet, as given.     Follow-Up: At [facility], you and your health needs are our priority.  As part of our continuing mission to provide you with exceptional heart care, we have created designated Provider Care Teams.  These Care Teams include your primary Cardiologist (physician) and Advanced Practice Providers (APPs -  Physician Assistants and Nurse Practitioners) who all work together to provide you with the care you need, when you need it.  We recommend signing up for the patient portal called "MyChart".  Sign up information is provided on this After Visit Summary.  MyChart is used to connect with patients for Virtual Visits (Telemedicine).  Patients are able to view lab/test results, encounter notes, upcoming appointments, etc.   Non-urgent messages can be sent to your provider as well.   To learn more about what you can do with MyChart, go to [email or web address].    Your next appointment:   After testing   The format for your next appointment:   In Person  Provider:    You may see Kate Sable, MD or one of the following Advanced Practice Providers on your designated Care Team:    Murray Hodgkins, NP  Christell Faith, PA-C  Marrianne Mood, PA-C    Other Instructions [place]  Your caregiver has ordered a Stress Test with nuclear imaging. The purpose of this test is to evaluate the blood supply to your heart muscle. This procedure is referred to as a "Non-Invasive Stress Test." This is because other than having an IV started in your vein, nothing is inserted or "invades" your body. Cardiac stress tests are done to find areas of poor blood flow to the heart by determining the extent of coronary artery disease (CAD). Some patients exercise on a treadmill, which naturally increases the blood flow to your heart, while others who are  unable to walk on a treadmill due to physical limitations have a pharmacologic/chemical stress agent called Lexiscan . This medicine will mimic walking on a treadmill by temporarily increasing your coronary blood flow.   Please note: these test may take anywhere between 2-4 hours to complete  PLEASE REPORT TO [facility] AT THE FIRST DESK WILL DIRECT YOU WHERE TO GO  Date of Procedure:_____________________________________  Arrival Time for Procedure:______________________________    PLEASE NOTIFY THE OFFICE AT LEAST 24 HOURS IN  ADVANCE IF YOU ARE UNABLE TO KEEP YOUR APPOINTMENT.  [phone] AND  PLEASE NOTIFY NUCLEAR MEDICINE AT [facility] AT LEAST 24 HOURS IN ADVANCE IF YOU ARE UNABLE TO KEEP YOUR APPOINTMENT. [phone]  How to prepare for your Myoview test:  1. Do not eat or drink after midnight 2. No caffeine for 24 hours prior  to test 3. No smoking 24 hours prior to test. 4. Your medication may be taken with water.  If your doctor stopped a medication because of this test, do not take that medication. 5. Ladies, please do not wear dresses.  Skirts or pants are appropriate. Please wear a short sleeve shirt. 6. No perfume, cologne or lotion. 7. Wear comfortable walking shoes. No heels!

## 2019-03-30 NOTE — Progress Notes (Signed)
Cardiology Office Note:    Date:  03/30/2019   ID:  Colleen [name], DOB Apr 24, 1945, MRN [id number]  PCP:  Delsa Grana, PA-C  Cardiologist:  Kate Sable, MD  Electrophysiologist:  None   Referring MD: Delsa Grana, PA-C   Chief Complaint  Patient presents with  . New Patient (Initial Visit)    pt having palps/ having SOB due to COPD. Meds verbally reviewed w/ pt.    History of Present Illness:    Colleen [name] is a 74 y.o. female with a hx of hypertension, hyperlipidemia, COPD, former smoker who presents due to shortness of breath abnormal ECG.  Patient was diagnosed with COPD over 10 to 15 years ago.  She states having shortness of breath with exertion for years now.  Using her inhalers helps with her shortness of breath.  Symptoms of shortness of breath have stayed roughly the same.  Redness of breath typically improves with rest.  She denies chest pain with exertion.  She also notes occasional skipped heartbeats.  Was seen at her primary care provider's office where EKG showed frequent PACs.  She denies any history of heart disease such as MIs or heart failure.  Denies edema, orthopnea.  Her mobility is limited due to knee arthritis.  Past Medical History:  Diagnosis Date  . Asthma   . Cataract   . COPD (chronic obstructive pulmonary disease) ([facility])   . GERD (gastroesophageal reflux disease)   . Gout   . Hyperlipidemia   . Hypertension   . Osteoarthrosis   . Prediabetes     Past Surgical History:  Procedure Laterality Date  . BREAST EXCISIONAL BIOPSY Left 04/08/2014   Intraductal papilloma-5 mm  . LUMBAR LAMINECTOMY    . TONSILLECTOMY      Current Medications: Current Meds  Medication Sig  . allopurinol (ZYLOPRIM) 100 MG tablet Take 1.5 tablets (150 mg total) by mouth daily. (Patient taking differently: Take 150 mg by mouth daily. Taking 2 tablets daily)  . aspirin EC 81 MG tablet Take 81 mg by mouth daily.  . Blood Pressure Monitoring (BLOOD PRESSURE KIT) DEVI  1 Device by Does not apply route daily. Fluctuating blood pressure; please get appropriate size, large cuff  . enalapril (VASOTEC) 10 MG tablet Take 1 tablet (10 mg total) by mouth daily.  . famotidine (PEPCID) 20 MG tablet Take 1 tablet (20 mg total) by mouth at bedtime. This is to replace protonix.  . ferrous sulfate 325 (65 FE) MG EC tablet Take 1 tablet (325 mg total) by mouth daily.  . hydrochlorothiazide (MICROZIDE) 12.5 MG capsule Take 1 capsule (12.5 mg total) by mouth daily.  Marland Kitchen lovastatin (MEVACOR) 40 MG tablet Take 1 tablet (40 mg total) by mouth at bedtime.  Marland Kitchen PROAIR HFA 108 (90 Base) MCG/ACT inhaler INHALE 2 PUFFS INTO LUNGS EVERY 4 HOURS AS NEEDED FOR WHEEZING FOR SHORTNESS OF BREATH  . SYMBICORT 160-4.5 MCG/ACT inhaler Inhale 2 puffs into the lungs 2 (two) times daily.  Marland Kitchen triamcinolone cream (KENALOG) 0.1 % APPLY CREAM EXTERNALLY TO AFFECTED AREA TWICE DAILY if needed; not more than one week at a time, then take a break for two weeks  . Vitamin D, Cholecalciferol, 25 MCG (1000 UT) TABS Take 1 tablet by mouth daily.      Allergies:   Patient has no known allergies.   Social History   Socioeconomic History  . Marital status: Widowed    Spouse name: Titus Dubin  . Number of children: 0  .  Years of education: Not on file  . Highest education level: 12th grade  Occupational History  . Occupation: Retired  Tobacco Use  . Smoking status: Former Smoker    Packs/day: 1.50    Years: 20.00    Pack years: 30.00    Types: Cigarettes    Quit date: 11/13/2008    Years since quitting: 10.3  . Smokeless tobacco: Never Used  . Tobacco comment: smoking cessation materials not required  Substance and Sexual Activity  . Alcohol use: Not Currently    Alcohol/week: 1.0 standard drinks    Types: 1 Cans of beer per week  . Drug use: No  . Sexual activity: Not Currently  Other Topics Concern  . Not on file  Social History Narrative  . Not on file   Social Determinants of Health    Financial Resource Strain:   . Difficulty of Paying Living Expenses: Not on file  Food Insecurity:   . Worried About Charity fundraiser in the Last Year: Not on file  . Ran Out of Food in the Last Year: Not on file  Transportation Needs:   . Lack of Transportation (Medical): Not on file  . Lack of Transportation (Non-Medical): Not on file  Physical Activity:   . Days of Exercise per Week: Not on file  . Minutes of Exercise per Session: Not on file  Stress:   . Feeling of Stress : Not on file  Social Connections:   . Frequency of Communication with Friends and Family: Not on file  . Frequency of Social Gatherings with Friends and Family: Not on file  . Attends Religious Services: Not on file  . Active Member of Clubs or Organizations: Not on file  . Attends Archivist Meetings: Not on file  . Marital Status: Not on file     Family History: The patient's family history includes Congestive Heart Failure in her sister; Diabetes in her mother and sister; Glaucoma in her sister; Lung cancer in her father. There is no history of Breast cancer.  ROS:   Please see the history of present illness.     All other systems reviewed and are negative.  EKGs/Labs/Other Studies Reviewed:    The following studies were reviewed today:   EKG:  EKG is  ordered today.  The ekg ordered today demonstrates sinus rhythm, frequent PACs.  Recent Labs: 03/06/2019: ALT 11; BUN 22; Creat 1.01; Hemoglobin 9.2; Platelets 488; Potassium 4.0; Sodium 137; TSH 1.62  Recent Lipid Panel    Component Value Date/Time   CHOL 150 03/06/2019 0928   CHOL 161 07/30/2014 0830   TRIG 68 03/06/2019 0928   HDL 72 03/06/2019 0928   HDL 82 07/30/2014 0830   CHOLHDL 2.1 03/06/2019 0928   VLDL 15 12/31/2015 0922   LDLCALC 64 03/06/2019 0928    Physical Exam:    VS:  BP 138/62 (BP Location: Right Arm, Patient Position: Sitting, Cuff Size: Large)   Pulse 92   Ht '5\' 8"'  (1.727 m)   Wt 291 lb 12 oz (132.3  kg)   SpO2 98%   BMI 44.36 kg/m     Wt Readings from Last 3 Encounters:  03/30/19 291 lb 12 oz (132.3 kg)  03/06/19 295 lb (133.8 kg)  02/10/19 274 lb (124.3 kg)     GEN:  Well nourished, well developed in no acute distress, obese HEENT: Normal NECK: No JVD; No carotid bruits LYMPHATICS: No lymphadenopathy CARDIAC: Regular, frequent skipped beats, no murmurs, rubs,  gallops RESPIRATORY:  Clear to auscultation without rales, wheezing or rhonchi  ABDOMEN: Soft, non-tender, non-distended MUSCULOSKELETAL:  No edema; No deformity  SKIN: Warm and dry NEUROLOGIC:  Alert and oriented x 3 PSYCHIATRIC:  Normal affect   ASSESSMENT:    1. Dyspnea on exertion   2. PAC (premature atrial contraction)   3. Essential hypertension   4. Pure hypercholesterolemia    PLAN:    In order of problems listed above:  1. Patient with dyspnea on exertion which has stayed roughly the same for years.  This has been attributed to COPD which is likely.  She has risk factors of hypertension, hyperlipidemia, prior smoking history.  Ischemia is also possible.  We will evaluate with an echocardiogram and Lexiscan myocardial perfusion imaging stress test for ischemia. 2. Patient with frequent PACs noted on cardiac monitor.  This is likely due to history of COPD as this is common in COPD patients. 3. Blood pressure is reasonably controlled.  Continue current BP meds. 4. History of hypertension, continue statin as prescribed.  Follow-up after echocardiogram and stress test.  This note was generated in part or whole with voice recognition software. Voice recognition is usually quite accurate but there are transcription errors that can and very often do occur. I apologize for any typographical errors that were not detected and corrected.  Medication Adjustments/Labs and Tests Ordered: Current medicines are reviewed at length with the patient today.  Concerns regarding medicines are outlined above.  Orders Placed  This Encounter  Procedures  . NM Myocar Multi W/Spect W/Wall Motion / EF  . EKG 12-Lead  . ECHOCARDIOGRAM COMPLETE   No orders of the defined types were placed in this encounter.   Patient Instructions  Medication Instructions:  Your physician recommends that you continue on your current medications as directed. Please refer to the Current Medication list given to you today.  *If you need a refill on your cardiac medications before your next appointment, please call your pharmacy*   Lab Work: None ordered If you have labs (blood work) drawn today and your tests are completely normal, you will receive your results only by: Marland Kitchen MyChart Message (if you have MyChart) OR . A paper copy in the mail If you have any lab test that is abnormal or we need to change your treatment, we will call you to review the results.   Testing/Procedures: Your physician has requested that you have an echocardiogram. Echocardiography is a painless test that uses sound waves to create images of your heart. It provides your doctor with information about the size and shape of your heart and how well your heart's chambers and valves are working. This procedure takes approximately one hour. There are no restrictions for this procedure.  Your physician has requested that you have a lexiscan myoview. For further information please visit [email or web address]. Please follow instruction sheet, as given.     Follow-Up: At [facility], you and your health needs are our priority.  As part of our continuing mission to provide you with exceptional heart care, we have created designated Provider Care Teams.  These Care Teams include your primary Cardiologist (physician) and Advanced Practice Providers (APPs -  Physician Assistants and Nurse Practitioners) who all work together to provide you with the care you need, when you need it.  We recommend signing up for the patient portal called "MyChart".  Sign up information is  provided on this After Visit Summary.  MyChart is used to connect with patients for Virtual  Visits (Telemedicine).  Patients are able to view lab/test results, encounter notes, upcoming appointments, etc.  Non-urgent messages can be sent to your provider as well.   To learn more about what you can do with MyChart, go to [email or web address].    Your next appointment:   After testing   The format for your next appointment:   In Person  Provider:    You may see Kate Sable, MD or one of the following Advanced Practice Providers on your designated Care Team:    Murray Hodgkins, NP  Christell Faith, PA-C  Marrianne Mood, PA-C    Other Instructions [place]  Your caregiver has ordered a Stress Test with nuclear imaging. The purpose of this test is to evaluate the blood supply to your heart muscle. This procedure is referred to as a "Non-Invasive Stress Test." This is because other than having an IV started in your vein, nothing is inserted or "invades" your body. Cardiac stress tests are done to find areas of poor blood flow to the heart by determining the extent of coronary artery disease (CAD). Some patients exercise on a treadmill, which naturally increases the blood flow to your heart, while others who are  unable to walk on a treadmill due to physical limitations have a pharmacologic/chemical stress agent called Lexiscan . This medicine will mimic walking on a treadmill by temporarily increasing your coronary blood flow.   Please note: these test may take anywhere between 2-4 hours to complete  PLEASE REPORT TO [facility] AT THE FIRST DESK WILL DIRECT YOU WHERE TO GO  Date of Procedure:_____________________________________  Arrival Time for Procedure:______________________________    PLEASE NOTIFY THE OFFICE AT LEAST 24 HOURS IN ADVANCE IF YOU ARE UNABLE TO KEEP YOUR APPOINTMENT.  [phone] AND  PLEASE NOTIFY NUCLEAR MEDICINE AT  [facility] AT LEAST 24 HOURS IN ADVANCE IF YOU ARE UNABLE TO KEEP YOUR APPOINTMENT. [phone]  How to prepare for your Myoview test:  1. Do not eat or drink after midnight 2. No caffeine for 24 hours prior to test 3. No smoking 24 hours prior to test. 4. Your medication may be taken with water.  If your doctor stopped a medication because of this test, do not take that medication. 5. Ladies, please do not wear dresses.  Skirts or pants are appropriate. Please wear a short sleeve shirt. 6. No perfume, cologne or lotion. 7. Wear comfortable walking shoes. No heels!                Signed, Kate Sable, MD  03/30/2019 12:10 PM    [facility]

## 2019-04-10 ENCOUNTER — Encounter: Payer: Self-pay | Admitting: Gastroenterology

## 2019-04-11 ENCOUNTER — Ambulatory Visit (INDEPENDENT_AMBULATORY_CARE_PROVIDER_SITE_OTHER): Payer: PPO | Admitting: Gastroenterology

## 2019-04-11 ENCOUNTER — Encounter: Payer: Self-pay | Admitting: Gastroenterology

## 2019-04-11 ENCOUNTER — Telehealth: Payer: Self-pay

## 2019-04-11 DIAGNOSIS — D509 Iron deficiency anemia, unspecified: Secondary | ICD-10-CM | POA: Diagnosis not present

## 2019-04-11 MED ORDER — OMEPRAZOLE 20 MG PO CPDR: 20.0000 mg | DELAYED_RELEASE_CAPSULE | Freq: Every day | ORAL | 0 refills | Status: DC

## 2019-04-11 NOTE — Telephone Encounter (Signed)
Dr. Bonna Gains wants patient seen after stress test on 04/17/2019. Please call patient to make telephone visit after 04/17/2019

## 2019-04-11 NOTE — Telephone Encounter (Signed)
   [facility] Pre-operative Risk Assessment    Request for surgical clearance:  1. What type of surgery is being performed? Colonoscopy and EGD    2. When is this surgery scheduled? TBD Patient has stress test on 04/17/2019   3. What type of clearance is required (medical clearance vs. Pharmacy clearance to hold med vs. Both)? Medical clearance   4. Are there any medications that need to be held prior to surgery and how long? None    5. Practice name and name of physician performing surgery? [facility]    6. What is your office phone number [phone]     7.   What is your office fax number [phone]  8.   Anesthesia type (None, local, MAC, general) ? General    Colleen [name] 04/11/2019, 12:48 PM  _________________________________________________________________   (provider comments below)

## 2019-04-12 ENCOUNTER — Telehealth: Payer: Self-pay

## 2019-04-12 NOTE — Telephone Encounter (Signed)
Per Cardiologist: The patient is scheduled to undergo a stress test and echo. Unable to give cardiac clearance until testing is complete. If echo and stress test is normal then patient may undergo procedure from a cardiac perspective   Please advised if you want to keep her appointment on 04/24/2019 or move it back after the Echo

## 2019-04-12 NOTE — Progress Notes (Signed)
Added to waitlist closing encounter

## 2019-04-12 NOTE — Progress Notes (Signed)
  [facility] [address]  [address]  [place], [place] [address]  Main: [phone]  Fax: [phone]   Gastroenterology Consultation  Referring Provider:     Tapia, Leisa, PA-C Primary Care Physician:  Tapia, Leisa, PA-C Reason for Consultation:     Iron deficiency anemia        HPI:   Virtual Visit via Telephone Note  I connected with patient on 04/12/19 at 11:15 AM EDT by telephone and verified that I am speaking with the correct person using two identifiers.   I discussed the limitations, risks, security and privacy concerns of performing an evaluation and management service by telephone and the availability of in person appointments. I also discussed with the patient that there may be a patient responsible charge related to this service. The patient expressed understanding and agreed to proceed.  Location of the patient: Home Location of provider: Home Participating persons: Patient and provider only   History of Present Illness: Chief Complaint  Patient presents with  . New Patient (Initial Visit)  . Anemia    Patient denies any blood in stools, abdominal pain. Has some nausea,     Nobie F Daffern is a 74 y.o. y/o female referred for consultation & management  by Dr. Tapia, Leisa, PA-C.  Patient with iron deficiency anemia with hemoglobin around 9, and started dropping to 11 in August 2020 and has decreased to 9 on labs in January 2020.  Patient denies any episodes of bleeding.  Patient reports burning sensation in her stomach intermittently.  No black stool.  Denies NSAID use  She is currently undergoing cardiology work-up with trending stress test and echo due to shortness of breath  Past Medical History:  Diagnosis Date  . Asthma   . Cataract   . COPD (chronic obstructive pulmonary disease) ([facility])   . GERD (gastroesophageal reflux disease)   . Gout   . Hyperlipidemia   . Hypertension   . Osteoarthrosis   . Prediabetes     Past Surgical  History:  Procedure Laterality Date  . BREAST EXCISIONAL BIOPSY Left 04/08/2014   Intraductal papilloma-5 mm  . LUMBAR LAMINECTOMY    . TONSILLECTOMY      Prior to Admission medications   Medication Sig Start Date End Date Taking? Authorizing Provider  allopurinol (ZYLOPRIM) 100 MG tablet Take 1.5 tablets (150 mg total) by mouth daily. Patient taking differently: Take 150 mg by mouth daily. Taking 2 tablets daily 03/07/19 06/05/19 Yes Tapia, Leisa, PA-C  aspirin EC 81 MG tablet Take 81 mg by mouth daily.   Yes [provider]  Blood Pressure Monitoring (BLOOD PRESSURE KIT) DEVI 1 Device by Does not apply route daily. Fluctuating blood pressure; please get appropriate size, large cuff 02/03/18  Yes Lada, Melinda P, MD  enalapril (VASOTEC) 10 MG tablet Take 1 tablet (10 mg total) by mouth daily. 03/06/19  Yes Tapia, Leisa, PA-C  ferrous sulfate 325 (65 FE) MG EC tablet Take 1 tablet (325 mg total) by mouth daily. 03/14/19  Yes Tapia, Leisa, PA-C  gabapentin (NEURONTIN) 100 MG capsule Take 100 mg by mouth at bedtime.   Yes [provider]  hydrochlorothiazide (MICROZIDE) 12.5 MG capsule Take 1 capsule (12.5 mg total) by mouth daily. 03/06/19  Yes Tapia, Leisa, PA-C  lovastatin (MEVACOR) 40 MG tablet Take 1 tablet (40 mg total) by mouth at bedtime. 03/07/19  Yes Tapia, Leisa, PA-C  PROAIR HFA 108 (90 Base) MCG/ACT inhaler INHALE 2 PUFFS INTO LUNGS EVERY 4   HOURS AS NEEDED FOR WHEEZING FOR SHORTNESS OF BREATH 03/07/19  Yes Tapia, Leisa, PA-C  simethicone (GAS-X) 80 MG chewable tablet Chew 1 tablet (80 mg total) by mouth 2 (two) times a day. 08/02/18 04/10/19 Yes Paduchowski, Kevin, MD  SYMBICORT 160-4.5 MCG/ACT inhaler Inhale 2 puffs into the lungs 2 (two) times daily. 03/07/19  Yes Tapia, Leisa, PA-C  triamcinolone cream (KENALOG) 0.1 % APPLY CREAM EXTERNALLY TO AFFECTED AREA TWICE DAILY if needed; not more than one week at a time, then take a break for two weeks 03/06/19  Yes Tapia, Leisa, PA-C    Vitamin D, Cholecalciferol, 25 MCG (1000 UT) TABS Take 1 tablet by mouth daily.    Yes [provider]  omeprazole (PRILOSEC) 20 MG capsule Take 1 capsule (20 mg total) by mouth daily. 04/11/19 05/11/19  [facility], Varnita B, MD    Family History  Problem Relation Age of Onset  . Diabetes Mother   . Lung cancer Father   . Diabetes Sister   . Congestive Heart Failure Sister   . Glaucoma Sister   . Breast cancer Neg Hx      Social History   Tobacco Use  . Smoking status: Former Smoker    Packs/day: 1.50    Years: 20.00    Pack years: 30.00    Types: Cigarettes    Quit date: 11/13/2008    Years since quitting: 10.4  . Smokeless tobacco: Never Used  . Tobacco comment: smoking cessation materials not required  Substance Use Topics  . Alcohol use: Not Currently    Alcohol/week: 1.0 standard drinks    Types: 1 Cans of beer per week  . Drug use: No    Allergies as of 04/11/2019  . (No Known Allergies)    Review of Systems:    All systems reviewed and negative except where noted in HPI.   Observations/Objective:  Labs: CBC    Component Value Date/Time   WBC 9.1 03/06/2019 0928   RBC 3.77 (L) 03/06/2019 0928   HGB 9.2 (L) 03/06/2019 0928   HCT 29.5 (L) 03/06/2019 0928   PLT 488 (H) 03/06/2019 0928   MCV 78.2 (L) 03/06/2019 0928   MCH 24.4 (L) 03/06/2019 0928   MCHC 31.2 (L) 03/06/2019 0928   RDW 14.0 03/06/2019 0928   LYMPHSABS 2,921 03/06/2019 0928   MONOABS 0.5 02/10/2019 1434   EOSABS 173 03/06/2019 0928   BASOSABS 100 03/06/2019 0928   CMP     Component Value Date/Time   NA 137 03/06/2019 0928   NA 136 07/01/2015 1133   K 4.0 03/06/2019 0928   CL 100 03/06/2019 0928   CO2 29 03/06/2019 0928   GLUCOSE 123 (H) 03/06/2019 0928   BUN 22 03/06/2019 0928   BUN 22 07/01/2015 1133   CREATININE 1.01 (H) 03/06/2019 0928   CALCIUM 9.7 03/06/2019 0928   PROT 7.6 03/06/2019 0928   PROT 8.0 07/01/2015 1133   ALBUMIN 3.5 09/10/2018 0125   ALBUMIN 3.9  07/01/2015 1133   AST 19 03/06/2019 0928   ALT 11 03/06/2019 0928   ALKPHOS 108 09/10/2018 0125   BILITOT 0.3 03/06/2019 0928   BILITOT 0.4 07/01/2015 1133   GFRNONAA 56 (L) 03/06/2019 0928   GFRAA 64 03/06/2019 0928    Imaging Studies: No results found.  Assessment and Plan:   Brinn F Throne is a 74 y.o. y/o female has been referred for iron deficiency anemia  Assessment and Plan: EGD and colonoscopy indicated for further evaluation of iron deficiency   anemia  However, will need to obtain cardiology clearance prior to her procedures given her shortness of breath and pending work-up with stress test and echo  No alarm symptoms present.  Patient is on iron replacement by primary care provider  Referral to hematology discussed with patient would like to wait on the referral and continue her oral iron at this at this time  Omeprazole for dyspepsia  Avoid NSAID use such as Ibuprofen, Aleeve, advil, motrin, BC and Goodie powder, Naproxen, Meloxicam and others.    Follow Up Instructions:   I discussed the assessment and treatment plan with the patient. The patient was provided an opportunity to ask questions and all were answered. The patient agreed with the plan and demonstrated an understanding of the instructions.   The patient was advised to call back or seek an in-person evaluation if the symptoms worsen or if the condition fails to improve as anticipated.  I provided 15 minutes of non-face-to-face time during this encounter.   Varnita B [facility], MD  Speech recognition software was used to dictate the above note.  

## 2019-04-13 ENCOUNTER — Ambulatory Visit (INDEPENDENT_AMBULATORY_CARE_PROVIDER_SITE_OTHER): Payer: PPO

## 2019-04-13 ENCOUNTER — Other Ambulatory Visit: Payer: Self-pay

## 2019-04-13 DIAGNOSIS — R0600 Dyspnea, unspecified: Secondary | ICD-10-CM | POA: Diagnosis not present

## 2019-04-13 DIAGNOSIS — R0609 Other forms of dyspnea: Secondary | ICD-10-CM

## 2019-04-15 DIAGNOSIS — Z6841 Body Mass Index (BMI) 40.0 and over, adult: Secondary | ICD-10-CM | POA: Diagnosis not present

## 2019-04-15 DIAGNOSIS — R0602 Shortness of breath: Secondary | ICD-10-CM | POA: Diagnosis not present

## 2019-04-15 DIAGNOSIS — R918 Other nonspecific abnormal finding of lung field: Secondary | ICD-10-CM | POA: Diagnosis not present

## 2019-04-15 DIAGNOSIS — I471 Supraventricular tachycardia: Secondary | ICD-10-CM | POA: Diagnosis not present

## 2019-04-15 DIAGNOSIS — Z7982 Long term (current) use of aspirin: Secondary | ICD-10-CM | POA: Diagnosis not present

## 2019-04-15 DIAGNOSIS — Z87891 Personal history of nicotine dependence: Secondary | ICD-10-CM | POA: Diagnosis not present

## 2019-04-15 DIAGNOSIS — I129 Hypertensive chronic kidney disease with stage 1 through stage 4 chronic kidney disease, or unspecified chronic kidney disease: Secondary | ICD-10-CM | POA: Diagnosis not present

## 2019-04-15 DIAGNOSIS — N183 Chronic kidney disease, stage 3 unspecified: Secondary | ICD-10-CM | POA: Diagnosis not present

## 2019-04-15 DIAGNOSIS — J449 Chronic obstructive pulmonary disease, unspecified: Secondary | ICD-10-CM | POA: Diagnosis not present

## 2019-04-15 DIAGNOSIS — K219 Gastro-esophageal reflux disease without esophagitis: Secondary | ICD-10-CM | POA: Insufficient documentation

## 2019-04-15 DIAGNOSIS — Z7951 Long term (current) use of inhaled steroids: Secondary | ICD-10-CM | POA: Diagnosis not present

## 2019-04-15 DIAGNOSIS — Z780 Asymptomatic menopausal state: Secondary | ICD-10-CM | POA: Diagnosis not present

## 2019-04-15 DIAGNOSIS — I4891 Unspecified atrial fibrillation: Secondary | ICD-10-CM | POA: Diagnosis not present

## 2019-04-15 DIAGNOSIS — I491 Atrial premature depolarization: Secondary | ICD-10-CM | POA: Diagnosis not present

## 2019-04-15 DIAGNOSIS — I2699 Other pulmonary embolism without acute cor pulmonale: Secondary | ICD-10-CM | POA: Insufficient documentation

## 2019-04-15 DIAGNOSIS — R000 Tachycardia, unspecified: Secondary | ICD-10-CM | POA: Diagnosis not present

## 2019-04-15 DIAGNOSIS — Z79899 Other long term (current) drug therapy: Secondary | ICD-10-CM | POA: Diagnosis not present

## 2019-04-15 DIAGNOSIS — M545 Low back pain, unspecified: Secondary | ICD-10-CM | POA: Insufficient documentation

## 2019-04-15 DIAGNOSIS — Z20822 Contact with and (suspected) exposure to covid-19: Secondary | ICD-10-CM | POA: Diagnosis not present

## 2019-04-15 DIAGNOSIS — R002 Palpitations: Secondary | ICD-10-CM | POA: Diagnosis not present

## 2019-04-15 DIAGNOSIS — E785 Hyperlipidemia, unspecified: Secondary | ICD-10-CM | POA: Diagnosis not present

## 2019-04-15 DIAGNOSIS — I493 Ventricular premature depolarization: Secondary | ICD-10-CM | POA: Diagnosis not present

## 2019-04-15 DIAGNOSIS — I2693 Single subsegmental pulmonary embolism without acute cor pulmonale: Secondary | ICD-10-CM | POA: Diagnosis not present

## 2019-04-16 ENCOUNTER — Telehealth: Payer: Self-pay | Admitting: Cardiology

## 2019-04-16 DIAGNOSIS — I517 Cardiomegaly: Secondary | ICD-10-CM | POA: Diagnosis not present

## 2019-04-16 DIAGNOSIS — I2699 Other pulmonary embolism without acute cor pulmonale: Secondary | ICD-10-CM | POA: Diagnosis not present

## 2019-04-16 DIAGNOSIS — I4891 Unspecified atrial fibrillation: Secondary | ICD-10-CM | POA: Diagnosis not present

## 2019-04-16 DIAGNOSIS — I2693 Single subsegmental pulmonary embolism without acute cor pulmonale: Secondary | ICD-10-CM | POA: Diagnosis not present

## 2019-04-16 MED ORDER — GENERIC EXTERNAL MEDICATION
Status: DC
Start: ? — End: 2019-04-16

## 2019-04-16 MED ORDER — GABAPENTIN 100 MG PO CAPS
100.00 | ORAL_CAPSULE | ORAL | Status: DC
Start: 2019-04-16 — End: 2019-04-16

## 2019-04-16 MED ORDER — ALBUTEROL SULFATE HFA 108 (90 BASE) MCG/ACT IN AERS
2.00 | INHALATION_SPRAY | RESPIRATORY_TRACT | Status: DC
Start: ? — End: 2019-04-16

## 2019-04-16 MED ORDER — PRAVASTATIN SODIUM 40 MG PO TABS
40.00 | ORAL_TABLET | ORAL | Status: DC
Start: 2019-04-16 — End: 2019-04-16

## 2019-04-16 MED ORDER — ONDANSETRON 4 MG PO TBDP
4.00 | ORAL_TABLET | ORAL | Status: DC
Start: ? — End: 2019-04-16

## 2019-04-16 MED ORDER — HYDROCHLOROTHIAZIDE 12.5 MG PO TABS
12.50 | ORAL_TABLET | ORAL | Status: DC
Start: 2019-04-17 — End: 2019-04-16

## 2019-04-16 MED ORDER — ENALAPRIL MALEATE 10 MG PO TABS
10.00 | ORAL_TABLET | ORAL | Status: DC
Start: 2019-04-17 — End: 2019-04-16

## 2019-04-16 MED ORDER — LACTATED RINGERS IV SOLN
75.00 | INTRAVENOUS | Status: DC
Start: ? — End: 2019-04-16

## 2019-04-16 MED ORDER — ASPIRIN 81 MG PO CHEW
81.00 | CHEWABLE_TABLET | ORAL | Status: DC
Start: 2019-04-17 — End: 2019-04-16

## 2019-04-16 MED ORDER — FLUTICASONE FUROATE-VILANTEROL 200-25 MCG/INH IN AEPB
1.00 | INHALATION_SPRAY | RESPIRATORY_TRACT | Status: DC
Start: 2019-04-17 — End: 2019-04-16

## 2019-04-16 MED ORDER — ACETAMINOPHEN 325 MG PO TABS
650.00 | ORAL_TABLET | ORAL | Status: DC
Start: ? — End: 2019-04-16

## 2019-04-16 NOTE — Telephone Encounter (Signed)
Patient currently admitted and dc today from [facility] for blood clot in lungs    Per provider patient will not be able to do tomorrow please advise on reschedule.

## 2019-04-16 NOTE — Telephone Encounter (Signed)
Patient was supposed to have [facility] tomorrow 04/16/19.  Routing to provider to see when ok to reschedule lexiscan or f/u appointment to be prior to.

## 2019-04-18 MED ORDER — GENERIC EXTERNAL MEDICATION
Status: DC
Start: ? — End: 2019-04-18

## 2019-04-18 NOTE — Telephone Encounter (Signed)
Routing to scheduling. Please have patient come in for appointment with APP or [facility] as soon as possible.

## 2019-04-18 NOTE — Telephone Encounter (Signed)
Spoke with pt.  She had to go to [facility] due to not feeling good on Sunday.  The dr called and spoke with Cardiologist and had the stress test cancelled because pt has a blood clot in her lung and was in Afib.  They have put her on Eliqus 5 mg taking 2 tablets bid for 12 days then she is going down to 5 mg bid.  She's not sure if this procedure will take place now, or what they will do, per pt.

## 2019-04-18 NOTE — Telephone Encounter (Signed)
-----   Message from Kate Sable, MD sent at 04/14/2019  8:33 AM EDT ----- Normal systolic and diastolic function. Mild mr. Overall ok study. No significant abnormalities.

## 2019-04-18 NOTE — Telephone Encounter (Signed)
Patient made aware of echo results with verbalized understanding.  Patient sts that she was seen over the weekend at [facility] for palpitations. She was admitted for observation. Patient sts that she was told that she was in AFIB and that after a chest CTA she had a small PE. She was started on Eliquis dosed using the PE protocol.  Patient sts that the physician at [facility] discussed the plan of care with Dr. Garen Lah. [facility] notes are available in care everywhere. She plans to continue her [facility] care with [facility]. She has a f/u appt already scheduled on 05/21/19 with Dr. Garen Lah. Patient sts that she is doing well. She will call the office if cardiac symptoms develop and she needs to be seen sooner.  Patient voiced appreciation for the call.

## 2019-04-18 NOTE — Telephone Encounter (Signed)
Pt was just seen in the office 3/5 for eval of shortness of breath. She had normal echo. She was scheduled for stress test to be done yesterday, 3/23, but apparently canceled. We will need the stress test in order to clear pt.   Preop callback, can you see if pt is going to reschedule and let her know that we cannot clear her without it.

## 2019-04-18 NOTE — Telephone Encounter (Signed)
With all of those changes, pt should have hospital follow up in our office prior to having any procedures. Could go ahead and make hospital follow up appt.

## 2019-04-19 NOTE — Telephone Encounter (Signed)
I rescheduled her for 4/12, she was very (feisty) but hesitant to come in early. She did not understand why I was trying to move her appointment but ultimately I got it 2 weeks earlier than before

## 2019-04-20 NOTE — Telephone Encounter (Signed)
Per Douglass Rivers appt for 4/12 was cancelled so appt is still set for 05/21/19.

## 2019-04-20 NOTE — Telephone Encounter (Signed)
I will send clearance notes to Dr. Dannielle Burn in our [place] office. I will send FYI to Dr. Bonna Gains. I will remove from pre op call back pool.

## 2019-04-20 NOTE — Telephone Encounter (Signed)
Just a question, I may be missing something. Looks like pt is still scheduled for 4/26, though looks like notes say appt moved to 4/12.

## 2019-04-23 ENCOUNTER — Telehealth: Payer: Self-pay | Admitting: Gastroenterology

## 2019-04-23 NOTE — Telephone Encounter (Signed)
Patient returned my call after calling her to reminder  her appointment a telvisit FOR 04-24-19. Her visit after having a  stress test to discuss a future colonoscopy. Her cardiologist cancelled it because she had a blood clot & is now on blood thinner & heart issues.  She will call back to reschedule after she has the stress test.

## 2019-04-24 ENCOUNTER — Ambulatory Visit: Payer: PPO | Admitting: Gastroenterology

## 2019-05-01 ENCOUNTER — Encounter: Payer: Self-pay | Admitting: Family Medicine

## 2019-05-01 ENCOUNTER — Telehealth (INDEPENDENT_AMBULATORY_CARE_PROVIDER_SITE_OTHER): Payer: PPO | Admitting: Family Medicine

## 2019-05-03 ENCOUNTER — Ambulatory Visit
Admission: RE | Admit: 2019-05-03 | Discharge: 2019-05-03 | Disposition: A | Discharge status: Home / Self Care | Payer: PPO | Source: Ambulatory Visit | Attending: Family Medicine | Admitting: Family Medicine

## 2019-05-03 DIAGNOSIS — Z1231 Encounter for screening mammogram for malignant neoplasm of breast: Secondary | ICD-10-CM | POA: Insufficient documentation

## 2019-05-07 ENCOUNTER — Ambulatory Visit: Payer: PPO | Admitting: Cardiology

## 2019-05-08 ENCOUNTER — Ambulatory Visit: Payer: PPO | Admitting: Gastroenterology

## 2019-05-11 NOTE — Progress Notes (Signed)
Hospital visnit reviewed through mychart.  Unable to connect with pt.

## 2019-05-15 ENCOUNTER — Other Ambulatory Visit: Payer: PPO

## 2019-05-21 ENCOUNTER — Ambulatory Visit: Payer: PPO | Admitting: Cardiology

## 2019-05-21 ENCOUNTER — Other Ambulatory Visit: Payer: Self-pay

## 2019-05-21 ENCOUNTER — Encounter: Payer: Self-pay | Admitting: Cardiology

## 2019-05-21 VITALS — BP 128/60 | HR 86 | Ht 63.0 in | Wt 291.0 lb

## 2019-05-21 DIAGNOSIS — R0600 Dyspnea, unspecified: Secondary | ICD-10-CM

## 2019-05-21 DIAGNOSIS — I480 Paroxysmal atrial fibrillation: Secondary | ICD-10-CM | POA: Diagnosis not present

## 2019-05-21 DIAGNOSIS — I10 Essential (primary) hypertension: Secondary | ICD-10-CM | POA: Diagnosis not present

## 2019-05-21 DIAGNOSIS — R0609 Other forms of dyspnea: Secondary | ICD-10-CM

## 2019-05-21 NOTE — Progress Notes (Signed)
Cardiology Office Note:    Date:  05/21/2019   ID:  Colleen [name], DOB 01-06-1946, MRN [id number]  PCP:  Delsa Grana, PA-C  Cardiologist:  Kate Sable, MD  Electrophysiologist:  None   Referring MD: Delsa Grana, PA-C   No chief complaint on file. Patient here for follow-up.  History of Present Illness:    Colleen [name] is a 74 y.o. female with a hx of hypertension, hyperlipidemia, COPD, former smoker who presents for follow-up.  Last seen due to shortness of breath and abnormal ECG.  She has a history of shortness of breath for years which improved with inhalers and rest.  Due to risk factors of hypertension, hyperlipidemia, prior smoking, stress test via Myoview was ordered to evaluate ischemia and risk stratify patient but not performed because patient was admitted at [facility] and diagnosed with PE.Marland Kitchen  Echocardiogram was also ordered.  EKG showed frequent PACs.  Patient was admitted at [facility] last month due to palpitations.  Chest CTA showed PE.  While in the hospital, telemetry monitor did note paroxysmal atrial fibrillation..  Eliquis was started for PE protocol.  Referral to hematology also placed, patient has appointment next month.   Past Medical History:  Diagnosis Date  . Asthma   . Cataract   . COPD (chronic obstructive pulmonary disease) ([place])   . GERD (gastroesophageal reflux disease)   . Gout   . History of pulmonary embolus (PE)   . Hyperlipidemia   . Hypertension   . Osteoarthrosis   . Prediabetes     Past Surgical History:  Procedure Laterality Date  . BREAST EXCISIONAL BIOPSY Left 04/08/2014   Intraductal papilloma-5 mm  . LUMBAR LAMINECTOMY    . TONSILLECTOMY      Current Medications: Current Meds  Medication Sig  . allopurinol (ZYLOPRIM) 100 MG tablet Take 1.5 tablets (150 mg total) by mouth daily. (Patient taking differently: Take 150 mg by mouth daily. Taking 2 tablets daily)  . aspirin EC 81 MG tablet Take 81 mg by mouth daily.  . Blood Pressure  Monitoring (BLOOD PRESSURE KIT) DEVI 1 Device by Does not apply route daily. Fluctuating blood pressure; please get appropriate size, large cuff  . ELIQUIS 5 MG TABS tablet Take 5 mg by mouth 2 (two) times daily.  . enalapril (VASOTEC) 10 MG tablet Take 1 tablet (10 mg total) by mouth daily.  . ferrous sulfate 325 (65 FE) MG EC tablet Take 1 tablet (325 mg total) by mouth daily.  . hydrochlorothiazide (MICROZIDE) 12.5 MG capsule Take 1 capsule (12.5 mg total) by mouth daily.  Marland Kitchen lovastatin (MEVACOR) 40 MG tablet Take 1 tablet (40 mg total) by mouth at bedtime.  Marland Kitchen PROAIR HFA 108 (90 Base) MCG/ACT inhaler INHALE 2 PUFFS INTO LUNGS EVERY 4 HOURS AS NEEDED FOR WHEEZING FOR SHORTNESS OF BREATH  . SYMBICORT 160-4.5 MCG/ACT inhaler Inhale 2 puffs into the lungs 2 (two) times daily.  Marland Kitchen triamcinolone cream (KENALOG) 0.1 % APPLY CREAM EXTERNALLY TO AFFECTED AREA TWICE DAILY if needed; not more than one week at a time, then take a break for two weeks  . Vitamin D, Cholecalciferol, 25 MCG (1000 UT) TABS Take 1 tablet by mouth daily.      Allergies:   Patient has no known allergies.   Social History   Socioeconomic History  . Marital status: Widowed    Spouse name: Titus Dubin  . Number of children: 0  . Years of education: Not on file  . Highest education level:  12th grade  Occupational History  . Occupation: Retired  Tobacco Use  . Smoking status: Former Smoker    Packs/day: 1.50    Years: 20.00    Pack years: 30.00    Types: Cigarettes    Quit date: 11/13/2008    Years since quitting: 10.5  . Smokeless tobacco: Never Used  . Tobacco comment: smoking cessation materials not required  Substance and Sexual Activity  . Alcohol use: Not Currently    Alcohol/week: 1.0 standard drinks    Types: 1 Cans of beer per week  . Drug use: No  . Sexual activity: Not Currently  Other Topics Concern  . Not on file  Social History Narrative  . Not on file   Social Determinants of Health   Financial  Resource Strain:   . Difficulty of Paying Living Expenses:   Food Insecurity:   . Worried About Charity fundraiser in the Last Year:   . Arboriculturist in the Last Year:   Transportation Needs:   . Film/video editor (Medical):   Marland Kitchen Lack of Transportation (Non-Medical):   Physical Activity:   . Days of Exercise per Week:   . Minutes of Exercise per Session:   Stress:   . Feeling of Stress :   Social Connections:   . Frequency of Communication with Friends and Family:   . Frequency of Social Gatherings with Friends and Family:   . Attends Religious Services:   . Active Member of Clubs or Organizations:   . Attends Archivist Meetings:   Marland Kitchen Marital Status:      Family History: The patient's family history includes Congestive Heart Failure in her sister; Diabetes in her mother and sister; Glaucoma in her sister; Lung cancer in her father. There is no history of Breast cancer.  ROS:   Please see the history of present illness.     All other systems reviewed and are negative.  EKGs/Labs/Other Studies Reviewed:    The following studies were reviewed today:   EKG:  EKG is  ordered today.  The ekg ordered today demonstrates sinus rhythm, frequent PACs.  Recent Labs: 03/06/2019: ALT 11; BUN 22; Creat 1.01; Hemoglobin 9.2; Platelets 488; Potassium 4.0; Sodium 137; TSH 1.62  Recent Lipid Panel    Component Value Date/Time   CHOL 150 03/06/2019 0928   CHOL 161 07/30/2014 0830   TRIG 68 03/06/2019 0928   HDL 72 03/06/2019 0928   HDL 82 07/30/2014 0830   CHOLHDL 2.1 03/06/2019 0928   VLDL 15 12/31/2015 0922   LDLCALC 64 03/06/2019 0928    Physical Exam:    VS:  BP 128/60 (BP Location: Left Arm, Patient Position: Sitting, Cuff Size: Normal)   Pulse 86   Ht 5' 3" (1.6 m)   Wt 291 lb (132 kg)   SpO2 92%   BMI 51.55 kg/m     Wt Readings from Last 3 Encounters:  05/21/19 291 lb (132 kg)  05/01/19 291 lb (132 kg)  03/30/19 291 lb 12 oz (132.3 kg)     GEN:   Well nourished, well developed in no acute distress, obese HEENT: Normal NECK: No JVD; No carotid bruits LYMPHATICS: No lymphadenopathy CARDIAC: Regular, frequent skipped beats, no murmurs, rubs, gallops RESPIRATORY:  Clear to auscultation without rales, wheezing or rhonchi  ABDOMEN: Soft, non-tender, non-distended MUSCULOSKELETAL:  No edema; No deformity  SKIN: Warm and dry NEUROLOGIC:  Alert and oriented x 3 PSYCHIATRIC:  Normal affect   ASSESSMENT:  1. Dyspnea on exertion   2. Essential hypertension   3. Paroxysmal atrial fibrillation (HCC)    PLAN:    In order of problems listed above:  1. Patient with dyspnea on exertion which has stayed roughly the same for years.  This has been attributed to COPD which is likely.  She has risk factors of hypertension, hyperlipidemia, prior smoking history.  Ischemia is also possible.  Echocardiogram shows normal systolic and diastolic function, EF 55 to 60%, mild MR.  We will plan for [facility]. 2. Blood pressure adequately controlled.  Continue current BP meds. 3. Paroxysmal atrial fibrillation.  Currently in sinus.  Continue Eliquis as prescribed. 4. History of recent PE.  Keep appointment with hematology.  On Eliquis.  Follow-up after stress test.   This note was generated in part or whole with voice recognition software. Voice recognition is usually quite accurate but there are transcription errors that can and very often do occur. I apologize for any typographical errors that were not detected and corrected.  Medication Adjustments/Labs and Tests Ordered: Current medicines are reviewed at length with the patient today.  Concerns regarding medicines are outlined above.  Orders Placed This Encounter  Procedures  . EKG 12-Lead   No orders of the defined types were placed in this encounter.   Patient Instructions  Medication Instructions:  Your physician recommends that you continue on your current medications as directed.  Please refer to the Current Medication list given to you today.  *If you need a refill on your cardiac medications before your next appointment, please call your pharmacy*   Lab Work: None ordered If you have labs (blood work) drawn today and your tests are completely normal, you will receive your results only by: Marland Kitchen MyChart Message (if you have MyChart) OR . A paper copy in the mail If you have any lab test that is abnormal or we need to change your treatment, we will call you to review the results.   Testing/Procedures: Your physician has requested that you have a lexiscan myoview. For further information please visit [email or web address]. Please follow instruction sheet, as given.     Follow-Up: At [facility], you and your health needs are our priority.  As part of our continuing mission to provide you with exceptional heart care, we have created designated Provider Care Teams.  These Care Teams include your primary Cardiologist (physician) and Advanced Practice Providers (APPs -  Physician Assistants and Nurse Practitioners) who all work together to provide you with the care you need, when you need it.  We recommend signing up for the patient portal called "MyChart".  Sign up information is provided on this After Visit Summary.  MyChart is used to connect with patients for Virtual Visits (Telemedicine).  Patients are able to view lab/test results, encounter notes, upcoming appointments, etc.  Non-urgent messages can be sent to your provider as well.   To learn more about what you can do with MyChart, go to [email or web address].    Your next appointment:   4 week(s)  The format for your next appointment:   In Person  Provider:   Kate Sable, MD   Other Instructions [place]  Your caregiver has ordered a Stress Test with nuclear imaging. The purpose of this test is to evaluate the blood supply to your heart muscle. This procedure is referred to as a  "Non-Invasive Stress Test." This is because other than having an IV started in your vein, nothing is inserted or "invades" your  body. Cardiac stress tests are done to find areas of poor blood flow to the heart by determining the extent of coronary artery disease (CAD). Some patients exercise on a treadmill, which naturally increases the blood flow to your heart, while others who are  unable to walk on a treadmill due to physical limitations have a pharmacologic/chemical stress agent called Lexiscan . This medicine will mimic walking on a treadmill by temporarily increasing your coronary blood flow.   Please note: these test may take anywhere between 2-4 hours to complete  PLEASE REPORT TO [place] AT THE FIRST DESK WILL DIRECT YOU WHERE TO GO  Date of Procedure:_____________________________________  Arrival Time for Procedure:______________________________   PLEASE NOTIFY THE OFFICE AT LEAST 24 HOURS IN ADVANCE IF YOU ARE UNABLE TO KEEP YOUR APPOINTMENT.  [phone] AND  PLEASE NOTIFY NUCLEAR MEDICINE AT [facility] AT LEAST 24 HOURS IN ADVANCE IF YOU ARE UNABLE TO KEEP YOUR APPOINTMENT. [phone]  How to prepare for your Myoview test:  1. Do not eat or drink after midnight 2. No caffeine for 24 hours prior to test 3. No smoking 24 hours prior to test. 4. Your medication may be taken with water.  5. Ladies, please do not wear dresses.  Skirts or pants are appropriate. Please wear a short sleeve shirt. 6. No perfume, cologne or lotion. 7. Wear comfortable walking shoes. No heels!               Signed, Kate Sable, MD  05/21/2019 12:51 PM    [facility]

## 2019-05-21 NOTE — Patient Instructions (Signed)
Medication Instructions:  Your physician recommends that you continue on your current medications as directed. Please refer to the Current Medication list given to you today.  *If you need a refill on your cardiac medications before your next appointment, please call your pharmacy*   Lab Work: None ordered If you have labs (blood work) drawn today and your tests are completely normal, you will receive your results only by: Marland Kitchen MyChart Message (if you have MyChart) OR . A paper copy in the mail If you have any lab test that is abnormal or we need to change your treatment, we will call you to review the results.   Testing/Procedures: Your physician has requested that you have a lexiscan myoview. For further information please visit [email or web address]. Please follow instruction sheet, as given.     Follow-Up: At [facility], you and your health needs are our priority.  As part of our continuing mission to provide you with exceptional heart care, we have created designated Provider Care Teams.  These Care Teams include your primary Cardiologist (physician) and Advanced Practice Providers (APPs -  Physician Assistants and Nurse Practitioners) who all work together to provide you with the care you need, when you need it.  We recommend signing up for the patient portal called "MyChart".  Sign up information is provided on this After Visit Summary.  MyChart is used to connect with patients for Virtual Visits (Telemedicine).  Patients are able to view lab/test results, encounter notes, upcoming appointments, etc.  Non-urgent messages can be sent to your provider as well.   To learn more about what you can do with MyChart, go to [email or web address].    Your next appointment:   4 week(s)  The format for your next appointment:   In Person  Provider:   Kate Sable, MD   Other Instructions [place]  Your caregiver has ordered a Stress Test with nuclear imaging. The purpose of  this test is to evaluate the blood supply to your heart muscle. This procedure is referred to as a "Non-Invasive Stress Test." This is because other than having an IV started in your vein, nothing is inserted or "invades" your body. Cardiac stress tests are done to find areas of poor blood flow to the heart by determining the extent of coronary artery disease (CAD). Some patients exercise on a treadmill, which naturally increases the blood flow to your heart, while others who are  unable to walk on a treadmill due to physical limitations have a pharmacologic/chemical stress agent called Lexiscan . This medicine will mimic walking on a treadmill by temporarily increasing your coronary blood flow.   Please note: these test may take anywhere between 2-4 hours to complete  PLEASE REPORT TO [place] AT THE FIRST DESK WILL DIRECT YOU WHERE TO GO  Date of Procedure:_____________________________________  Arrival Time for Procedure:______________________________   PLEASE NOTIFY THE OFFICE AT LEAST 24 HOURS IN ADVANCE IF YOU ARE UNABLE TO KEEP YOUR APPOINTMENT.  [phone] AND  PLEASE NOTIFY NUCLEAR MEDICINE AT [facility] AT LEAST 24 HOURS IN ADVANCE IF YOU ARE UNABLE TO KEEP YOUR APPOINTMENT. [phone]  How to prepare for your Myoview test:  1. Do not eat or drink after midnight 2. No caffeine for 24 hours prior to test 3. No smoking 24 hours prior to test. 4. Your medication may be taken with water.  5. Ladies, please do not wear dresses.  Skirts or pants are appropriate. Please wear a short sleeve shirt.  6. No perfume, cologne or lotion. 7. Wear comfortable walking shoes. No heels!

## 2019-05-24 ENCOUNTER — Ambulatory Visit: Payer: PPO | Admitting: Gastroenterology

## 2019-05-29 DIAGNOSIS — D509 Iron deficiency anemia, unspecified: Secondary | ICD-10-CM | POA: Diagnosis not present

## 2019-05-29 DIAGNOSIS — M5416 Radiculopathy, lumbar region: Secondary | ICD-10-CM | POA: Diagnosis not present

## 2019-05-29 DIAGNOSIS — I2699 Other pulmonary embolism without acute cor pulmonale: Secondary | ICD-10-CM | POA: Diagnosis not present

## 2019-05-29 DIAGNOSIS — D473 Essential (hemorrhagic) thrombocythemia: Secondary | ICD-10-CM | POA: Diagnosis not present

## 2019-05-30 DIAGNOSIS — I2699 Other pulmonary embolism without acute cor pulmonale: Secondary | ICD-10-CM | POA: Diagnosis not present

## 2019-05-30 DIAGNOSIS — D509 Iron deficiency anemia, unspecified: Secondary | ICD-10-CM | POA: Diagnosis not present

## 2019-06-04 ENCOUNTER — Encounter
Admission: RE | Admit: 2019-06-04 | Discharge: 2019-06-04 | Disposition: A | Discharge status: Home / Self Care | Payer: PPO | Source: Ambulatory Visit | Attending: Cardiology | Admitting: Cardiology

## 2019-06-04 ENCOUNTER — Telehealth: Payer: Self-pay

## 2019-06-04 ENCOUNTER — Other Ambulatory Visit: Payer: Self-pay

## 2019-06-04 ENCOUNTER — Encounter: Payer: Self-pay | Admitting: Gastroenterology

## 2019-06-04 DIAGNOSIS — R0609 Other forms of dyspnea: Secondary | ICD-10-CM

## 2019-06-04 DIAGNOSIS — R0600 Dyspnea, unspecified: Secondary | ICD-10-CM | POA: Diagnosis not present

## 2019-06-04 LAB — NM MYOCAR MULTI W/SPECT W/WALL MOTION / EF
LV dias vol: 90 mL (ref 46–106)
LV sys vol: 47 mL
Peak HR: 104 {beats}/min
Percent HR: 70 %
Rest HR: 76 {beats}/min
SDS: 4
SRS: 12
SSS: 7
TID: 1.02

## 2019-06-04 MED ORDER — REGADENOSON 0.4 MG/5ML IV SOLN
0.4000 mg | Freq: Once | INTRAVENOUS | Status: AC
  Administered 2019-06-04: 09:00:00 0.4 mg via INTRAVENOUS

## 2019-06-04 MED ORDER — TECHNETIUM TC 99M TETROFOSMIN IV KIT
10.0000 | PACK | Freq: Once | INTRAVENOUS | Status: AC | PRN
  Administered 2019-06-04: 08:00:00 10.53 via INTRAVENOUS

## 2019-06-04 MED ORDER — TECHNETIUM TC 99M TETROFOSMIN IV KIT
30.0000 | PACK | Freq: Once | INTRAVENOUS | Status: AC | PRN
  Administered 2019-06-04: 09:00:00 31.755 via INTRAVENOUS

## 2019-06-04 NOTE — Telephone Encounter (Signed)
Called patient to let her know that Dr. Bonna Gains had spoken to her hematologist and that it was not recommended as of yet to stope her Eliquis. However, I told her that I was calling her to schedule her a follow up appointment with Dr. Bonna Gains after July 16, 2019. Patient stated that she was not going to schedule an appointment until she saw her hematologist again. I asked her when she was going to be seen and she stated that she did not know as of today but that she was going to receive a call from her hematologist with an appointment mid July. Patient did not want to schedule an appointment at this time with [place]. I will send myself a reminder to call patient mid July so I could schedule her an appointment with Dr. Bonna Gains. Hopefuly by then, we could obtain clearance.

## 2019-06-04 NOTE — Telephone Encounter (Signed)
-----   Message from Virgel Manifold, MD sent at 06/04/2019  1:11 PM EDT ----- Please let the patient know hematologist send [place] their notes.They do not recommend holding her anticoagulation until after July 16, 2019 for her procedures.  Please make clinic appointment for patient after this date and we can request clearance from hematology and cardiology at that time.

## 2019-06-04 NOTE — Progress Notes (Signed)
Hematology oncology note from [facility] recommends doing EGD colonoscopy after July 16, 2019, to allow for uninterrupted 3 months of anticoagulation for full holding.  Patient to follow-up in the clinic after June 21 as per the above recommendations

## 2019-06-12 ENCOUNTER — Encounter: Payer: Self-pay | Admitting: Family Medicine

## 2019-06-12 ENCOUNTER — Other Ambulatory Visit: Payer: Self-pay

## 2019-06-12 ENCOUNTER — Ambulatory Visit (INDEPENDENT_AMBULATORY_CARE_PROVIDER_SITE_OTHER): Payer: PPO | Admitting: Family Medicine

## 2019-06-12 VITALS — BP 118/64 | HR 98 | Temp 98.1°F | Resp 14 | Ht 63.0 in | Wt 293.0 lb

## 2019-06-12 DIAGNOSIS — Z7901 Long term (current) use of anticoagulants: Secondary | ICD-10-CM

## 2019-06-12 DIAGNOSIS — I6523 Occlusion and stenosis of bilateral carotid arteries: Secondary | ICD-10-CM | POA: Diagnosis not present

## 2019-06-12 DIAGNOSIS — I4891 Unspecified atrial fibrillation: Secondary | ICD-10-CM | POA: Diagnosis not present

## 2019-06-12 DIAGNOSIS — D473 Essential (hemorrhagic) thrombocythemia: Secondary | ICD-10-CM

## 2019-06-12 DIAGNOSIS — I10 Essential (primary) hypertension: Secondary | ICD-10-CM | POA: Diagnosis not present

## 2019-06-12 DIAGNOSIS — D75839 Thrombocytosis, unspecified: Secondary | ICD-10-CM

## 2019-06-12 DIAGNOSIS — I2699 Other pulmonary embolism without acute cor pulmonale: Secondary | ICD-10-CM | POA: Diagnosis not present

## 2019-06-12 DIAGNOSIS — M109 Gout, unspecified: Secondary | ICD-10-CM | POA: Diagnosis not present

## 2019-06-12 DIAGNOSIS — I700 Atherosclerosis of aorta: Secondary | ICD-10-CM

## 2019-06-12 DIAGNOSIS — J441 Chronic obstructive pulmonary disease with (acute) exacerbation: Secondary | ICD-10-CM

## 2019-06-12 DIAGNOSIS — L309 Dermatitis, unspecified: Secondary | ICD-10-CM

## 2019-06-12 DIAGNOSIS — D509 Iron deficiency anemia, unspecified: Secondary | ICD-10-CM | POA: Diagnosis not present

## 2019-06-12 DIAGNOSIS — E785 Hyperlipidemia, unspecified: Secondary | ICD-10-CM

## 2019-06-12 DIAGNOSIS — Z09 Encounter for follow-up examination after completed treatment for conditions other than malignant neoplasm: Secondary | ICD-10-CM

## 2019-06-12 MED ORDER — TRIAMCINOLONE ACETONIDE 0.1 % EX CREA: TOPICAL_CREAM | CUTANEOUS | 0 refills | Status: DC

## 2019-06-12 MED ORDER — ALLOPURINOL 100 MG PO TABS: 200.0000 mg | ORAL_TABLET | Freq: Every day | ORAL | 3 refills | Status: DC

## 2019-06-12 MED ORDER — ENALAPRIL MALEATE 10 MG PO TABS: 10.0000 mg | ORAL_TABLET | Freq: Every day | ORAL | 1 refills | Status: DC

## 2019-06-12 MED ORDER — ENALAPRIL MALEATE 10 MG PO TABS: 10.0000 mg | ORAL_TABLET | Freq: Every day | ORAL | 3 refills | Status: DC

## 2019-06-12 MED ORDER — TRELEGY ELLIPTA 200-62.5-25 MCG/INH IN AEPB: 1.0000 | INHALATION_SPRAY | Freq: Every day | RESPIRATORY_TRACT | 5 refills | Status: DC

## 2019-06-12 NOTE — Progress Notes (Signed)
Name: Colleen [name]   MRN: [id number]    DOB: 1945-06-21   Date:06/12/2019       Progress Note  Chief Complaint  Patient presents with  . Follow-up  . Hypertension  . Medication Refill     Subjective:   Colleen [name] is a 74 y.o. female, presents to clinic for routine follow up on the conditions listed above.  Here for f/up on HTN  Was referred to Cardiology - saw Dr. Garen Lah in March after complaining of gradual onset of DOE earlier this year-she had frequent PACs with EKG in office had complained of acute on chronic dyspnea on exertion, newer and worsening palpitations.  She consulted and establish with cardiology March 30, 2019.  He ordered follow-up echo and stress testing. ECHO done 3/26-ZTIWPY normal systolic and diastolic function with mild mitral regurg overall the study was good without any significant abnormalities.  About 2 days after this testing she presented to [facility] emergency department for worsening palpitations and was noted to be tachycardic - workup was significant for segmental PE she was admitted for observation and subsequently had A. fib though at presentation to the ER and when hospitalized records show normal sinus.  She has followed up with [facility] hematology and states that she will be on anticoagulants for 6 months. She is on eliquis 5 mg tablets twice daily.  She has not had any bruising or bleeding concerns  Patient followed up with hematology, Dr. Hulan Amato  on 05/29/2019 I had previously worked up her worsening anemia and thought that this may have been contributing towards her palpitations and shortness of breath and we started oral iron supplements. Otology did consult and worked up her right pulmonary embolus and iron deficiency anemia Reviewed visit and note: Ancil Linsey, MD - 05/29/2019 3:15 PM EDT Formatting of this note is different from the original. Hematology Consult Note  Referring Physician : Vernon Prey, MD  Primary Care Physician:  Delsa Grana, [facility]  Other Consulting Physicians: Vonda Antigua - [facility]  Reason for Consult: Right pulmonary embolus  Assessment/Recommendations: Colleen [name] is a 74 y.o. African American female who we are seeing in consultation at the request of Vernon Prey for further evaluation of R pulmonary embolus.  1. Venous thromboembolism: First episode was unprovoked RLL segmental PE on 04/15/19. [facility] records available. No LE symptoms, and BLE dopplers negative. TTE w/o evidence of R heart strain. VTE risk factors: a) morbid obesity. Admitted overnight and started on apixaban, which patient remains on to date without major bleeding issues. Co-pay is "affordable". SOB has improved but has not returned to previous baseline.  In general, recommendations for unprovoked VTE are to continue anticoagulation long term given the increased risk of recurrence (~20-30% over 5 years for females) if anticoagulation is stopped. This was discussed with the patient today, and will therefore continue apixaban 5 mg BID for now with tentative plans for long term therapy if she continues to tolerate well and has no objections. Since females have lower risk of VTE recurrence than males, could consider stopping after 3-6 months if patient strongly prefers so, but would use APLA testing and D-dimer (on treatment and at 4 weeks off) to further risk stratify. New apixaban Rx sent electronically today. Given patient's morbid obesity and h/o gastric stapling surgery which might impair drug absorption, will obtain apixaban trough level to assess for appropriate drug level. If level is not in expected range, would need to consider alternative option such as warfarin.  Finally, given recent VTE, patient should not undergo any elective procedure that requires holding anticoagulation for at least the first 3 months of treatment (ie through 07/16/19). As such, agree with deferring GI bleeding workup with EGD/colonoscopy for now as is  being done. Also discussed with patient that holding anticoagulation is not typically required for routine dental work and extractions, which she states she is in need of. Return in 2 months to reassess decision for long term anticoagulation.  2. Iron deficiency anemia: Per available records, patient has had new onset progressive anemia beginning mid 2020 with decline in Hgb from 12.0 to 9.1 and MCV from 90 to 78 in early 2021. Iron parameters in early 02/2019 consistent with iron deficiency with ferritin of 8. Started on oral iron daily at that time, which she remains on to date and is tolerating well without GI side effects.  The cause for the new iron deficiency is not clear but likely related to GI blood losses. Query the possibility of impaired iron absorption given prior stomach stapling surgery, however, this occurred over 40 years ago and would be unlikely to manifest as an issue now, so again blood loss seems to be the more likely cause. Agree with deferring EGD/colonoscopy as per above until late 06/2019 given recent PE, but should be done soon thereafter.   Continue oral iron daily for now. Will obtain repeat CBC and iron parameters at time of apixaban trough level testing to ensure Hgb is stable to improved. Discussed with patient that it can take 4-6 months of oral iron to fully correct iron deficiency and anemia. Will plan to repeat labs at return visit in 2 months as well. If still iron deficient at that time, can consider IV iron infusion.  3. Thrombocytosis: Likely related to iron deficiency. Recheck at next visit to see if resolves with correction of iron deficiency.  Plans: 1. Continue apixaban 5 mg BID, with tentative plans for long term anticoagulation. 2. If patient prefers to come off, would use APLA testing and D-dimer for further risk stratification. 3. Check apixaban trough level given morbid obesity and h/o stomach stapling surgery. 4. Recommend against elective procedures that  require holding anticoagulation (including EGD/colonoscopy) until 07/16/19. 5. Routine dental work and extractions OK to be done without holding anticoagulation. 6. Continue oral iron daily. 7. Agree with GI bleeding evaluation after completion of 3 months of anticoagulation. 8. Repeat CBC and iron parameters at next visit and consider IV iron if still iron deficient. 9. Return in 2 months for follow up and labs.  Yevonne Pax, MD   COPD-patient with longstanding COPD and significant past smoking history.  She states that she wants trelegy not symbicort -was given Trelegy in the hospital and it did work much better for her than Symbicort, will see if this is think she can afford or is covered by insurance.  Hypertension:  Currently managed on enalapril and hydrochlorothiazide 12.5 mg Pt reports good med compliance and denies any SE.  No lightheadedness, hypotension, syncope. Blood pressure today is well controlled. BP Readings from Last 3 Encounters:  06/12/19 118/64  05/21/19 128/60  05/01/19 (!) 142/78   Pt denies CP, SOB, exertional sx, LE edema, palpitation, Ha's, visual disturbances -she does report that her shortness of breath is improving She eats healthy diet avoid salt.  She wants her enalapril refilled today is currently on 10 mg dose daily   Patient Active Problem List   Diagnosis Date Noted  . GERD (gastroesophageal reflux  disease) 04/15/2019  . Chronic low back pain 04/15/2019  . Right pulmonary embolus ([place]) 04/15/2019  . Palpitations 03/13/2019  . DOE (dyspnea on exertion) 03/13/2019  . Anemia 03/07/2019  . Atherosclerosis of both carotid arteries 09/20/2018  . Spinal stenosis of lumbar region with neurogenic claudication 09/20/2018  . Near syncope 09/10/2018  . Aortic atherosclerosis ([place]) 09/05/2018  . At high risk for falls 09/05/2018  . Morbid obesity with BMI of 45.0-49.9, adult ([place]) 08/29/2017  . Osteopenia 05/18/2017  . Stage 3 chronic kidney disease  04/21/2017  . Morbid obesity ([place]) 04/21/2017  . Trigger middle finger of left hand 02/28/2017  . Chronic gouty arthropathy without tophi 11/29/2016  . Hypergammaglobulinemia 11/29/2016  . Osteoarthritis of both knees 11/29/2016  . Bilateral primary osteoarthritis of knee 11/29/2016  . Allergic rhinitis, seasonal 07/30/2014  . HLD (hyperlipidemia) 07/30/2014  . Osteoarthritis of knee 07/30/2014  . Prediabetes 07/30/2014  . Asthma, mild persistent 07/30/2014  . COPD (chronic obstructive pulmonary disease) ([place]) 07/09/2008  . Essential hypertension 06/22/2006    Past Surgical History:  Procedure Laterality Date  . BREAST EXCISIONAL BIOPSY Left 04/08/2014   Intraductal papilloma-5 mm  . LUMBAR LAMINECTOMY    . TONSILLECTOMY      Family History  Problem Relation Age of Onset  . Diabetes Mother   . Lung cancer Father   . Diabetes Sister   . Congestive Heart Failure Sister   . Glaucoma Sister   . Breast cancer Neg Hx     Social History   Tobacco Use  . Smoking status: Former Smoker    Packs/day: 1.50    Years: 20.00    Pack years: 30.00    Types: Cigarettes    Quit date: 11/13/2008    Years since quitting: 10.5  . Smokeless tobacco: Never Used  . Tobacco comment: smoking cessation materials not required  Substance Use Topics  . Alcohol use: Not Currently    Alcohol/week: 1.0 standard drinks    Types: 1 Cans of beer per week  . Drug use: No      Current Outpatient Medications:  .  aspirin EC 81 MG tablet, Take 81 mg by mouth daily., Disp: , Rfl:  .  cholecalciferol (VITAMIN D3) 25 MCG (1000 UNIT) tablet, Take 1,000 Units by mouth daily., Disp: , Rfl:  .  ELIQUIS 5 MG TABS tablet, Take 5 mg by mouth 2 (two) times daily., Disp: , Rfl:  .  enalapril (VASOTEC) 10 MG tablet, Take 1 tablet (10 mg total) by mouth daily., Disp: 90 tablet, Rfl: 1 .  ferrous sulfate 325 (65 FE) MG EC tablet, Take 1 tablet (325 mg total) by mouth daily., Disp: 90 tablet, Rfl: 1 .   hydrochlorothiazide (MICROZIDE) 12.5 MG capsule, Take 1 capsule (12.5 mg total) by mouth daily., Disp: 90 capsule, Rfl: 1 .  lovastatin (MEVACOR) 40 MG tablet, Take 1 tablet (40 mg total) by mouth at bedtime., Disp: 90 tablet, Rfl: 3 .  PROAIR HFA 108 (90 Base) MCG/ACT inhaler, INHALE 2 PUFFS INTO LUNGS EVERY 4 HOURS AS NEEDED FOR WHEEZING FOR SHORTNESS OF BREATH, Disp: 18 g, Rfl: 5 .  SYMBICORT 160-4.5 MCG/ACT inhaler, Inhale 2 puffs into the lungs 2 (two) times daily., Disp: 33 g, Rfl: 5 .  triamcinolone cream (KENALOG) 0.1 %, APPLY CREAM EXTERNALLY TO AFFECTED AREA TWICE DAILY if needed; not more than one week at a time, then take a break for two weeks, Disp: 80 g, Rfl: 0 .  allopurinol (ZYLOPRIM) 100 MG  tablet, Take 1.5 tablets (150 mg total) by mouth daily. (Patient taking differently: Take 150 mg by mouth daily. Taking 2 tablets daily), Disp: 135 tablet, Rfl: 3 .  Blood Pressure Monitoring (BLOOD PRESSURE KIT) DEVI, 1 Device by Does not apply route daily. Fluctuating blood pressure; please get appropriate size, large cuff, Disp: 1 Device, Rfl: 0 .  omeprazole (PRILOSEC) 20 MG capsule, Take 1 capsule (20 mg total) by mouth daily. (Patient not taking: Reported on 05/01/2019), Disp: 30 capsule, Rfl: 0 .  simethicone (GAS-X) 80 MG chewable tablet, Chew 1 tablet (80 mg total) by mouth 2 (two) times a day., Disp: 60 tablet, Rfl: 2  No Known Allergies  Chart Review Today: I personally reviewed active problem list, medication list, allergies, family history, social history, health maintenance, notes from last encounter, lab results, imaging with the patient/caregiver today.   Review of Systems  10 Systems reviewed and are negative for acute change except as noted in the HPI.  Objective:    Vitals:   06/12/19 0918  BP: 118/64  Pulse: 98  Resp: 14  Temp: 98.1 F (36.7 C)  SpO2: 99%  Weight: 293 lb (132.9 kg)  Height: _0  (1.6 m)    Body mass index is 51.9 kg/m.  Physical Exam Vitals  and nursing note reviewed.  Constitutional:      General: She is not in acute distress.    Appearance: She is obese. She is not ill-appearing, toxic-appearing or diaphoretic.  HENT:     Head: Normocephalic and atraumatic.     Right Ear: External ear normal.     Left Ear: External ear normal.  Eyes:     General:        Right eye: No discharge.        Left eye: No discharge.     Conjunctiva/sclera: Conjunctivae normal.  Cardiovascular:     Rate and Rhythm: Normal rate and regular rhythm.     Pulses: Normal pulses.     Heart sounds: Normal heart sounds. No murmur. No friction rub. No gallop.   Pulmonary:     Effort: Pulmonary effort is normal. No respiratory distress.     Breath sounds: Normal breath sounds. No stridor. No wheezing, rhonchi or rales.  Abdominal:     General: Bowel sounds are normal. There is no distension.     Palpations: Abdomen is soft.     Tenderness: There is no abdominal tenderness.  Skin:    General: Skin is warm.     Coloration: Skin is not jaundiced or pale.     Findings: No bruising, erythema or lesion.  Neurological:     Mental Status: Mental status is at baseline.     Gait: Gait abnormal.  Psychiatric:        Mood and Affect: Mood normal.        Behavior: Behavior normal.       PHQ2/9: Depression screen [facility] 2/9 06/12/2019 05/01/2019 03/06/2019 09/20/2018 09/05/2018  Decreased Interest 0 0 0 0 0  Down, Depressed, Hopeless 0 0 0 0 0  PHQ - 2 Score 0 0 0 0 0  Altered sleeping 0 0 0 0 0  Tired, decreased energy 0 0 0 0 0  Change in appetite 0 0 0 0 0  Feeling bad or failure about yourself  0 0 0 0 0  Trouble concentrating 0 0 0 0 0  Moving slowly or fidgety/restless 0 0 0 0 0  Suicidal thoughts 0 0 0 0 0  PHQ-9 Score 0 0 0 0 0  Difficult doing work/chores Not difficult at all Not difficult at all Not difficult at all Not difficult at all Not difficult at all  Some recent data might be hidden    phq 9 is neg reviewed  Fall Risk: Fall Risk   06/12/2019 05/01/2019 03/06/2019 09/20/2018 09/05/2018  Falls in the past year? 0 0 _0 Number falls in past yr: 0 0 _1 Injury with Fall? 0 0 _2 Comment - - - - head contusion  Risk for fall due to : - - - Impaired balance/gait;Impaired mobility;History of fall(s) Impaired balance/gait;Impaired mobility;History of fall(s)  Risk for fall due to: Comment - - - - -  Follow up - - - - Falls prevention discussed    Functional Status Survey: Is the patient deaf or have difficulty hearing?: No Does the patient have difficulty seeing, even when wearing glasses/contacts?: No Does the patient have difficulty concentrating, remembering, or making decisions?: No Does the patient have difficulty walking or climbing stairs?: No Does the patient have difficulty dressing or bathing?: No Does the patient have difficulty doing errands alone such as visiting a doctor's office or shopping?: No   Assessment & Plan:     ICD-10-CM   1. Benign essential HTN  C58 COMPLETE METABOLIC PANEL WITH GFR    enalapril (VASOTEC) 10 MG tablet    DISCONTINUED: enalapril (VASOTEC) 10 MG tablet   slightly lower today than her recent average, pt wants to maintain same dose meds, checking renal function, watch to hypotension, discussed with pt  2. Gout, unspecified cause, unspecified chronicity, unspecified site  I50.2 COMPLETE METABOLIC PANEL WITH GFR    Uric acid    allopurinol (ZYLOPRIM) 100 MG tablet   200 mg allipurinol to maintain uric acid <6.0  3. Aortic atherosclerosis (HCC)  I70.0   4. Atherosclerosis of both carotid arteries  I65.23    Compliant with statin medication, monitoring  5. Right pulmonary embolus [facility])  I26.99    Diagnosed March 22 and admitted to [facility], tolerating Eliquis, palpitations and shortness of breath have improved slightly -per hematology and cardiology  6. Chronic obstructive pulmonary disease with acute exacerbation (HCC)  J44.1 Fluticasone-Umeclidin-Vilant (TRELEGY ELLIPTA) 200-62.5-25  MCG/INH AEPB   want to switch to trelegy from symbicort  7. Thrombocytosis ([place])  D47.3    Monitoring, seeing [facility] hematology  8. Iron deficiency anemia, unspecified iron deficiency anemia type  D50.9    Patient now consulting with hematology at [facility], continuing oral iron supplement and in June will do follow-up with GI to rule out GI blood loss, labs reviewed   9. Current use of anticoagulant therapy  D74.12 COMPLETE METABOLIC PANEL WITH GFR   eliquis 5 mg BID for PE, seeing hematology at [facility], monitor renal function, CBC, no current bleeding symptoms or concerns, cbc recently done, reviewed  10. Dermatitis of face  L30.9 triamcinolone cream (KENALOG) 0.1 %   refill on her topical steroid she has used to years long term - instructed her to use only very sparingly and warned of topical steroid use skin changes  11. Hyperlipidemia, unspecified hyperlipidemia type  I78.6 COMPLETE METABOLIC PANEL WITH GFR   Compliant with lovastatin  12. Atrial fibrillation, unspecified type (HCC)  V67.20 COMPLETE METABOLIC PANEL WITH GFR   Onset of A. fib when hospitalized in March at [facility], following up with cardiology, on Eliquis, HR felt regular at time of exam  13. Encounter for  examination following treatment at hospital  Z09    Reviewed all records, medication changes and follow-up with specialist.      COPD, d/c symbicort if trelegy is covered by insurance, not well controlled with symbicort  Return for 3-4 month f/up.   Delsa Grana, PA-C 06/12/19 9:47 AM

## 2019-06-13 LAB — COMPLETE METABOLIC PANEL WITH GFR
AG Ratio: 1 (calc) (ref 1.0–2.5)
ALT: 10 U/L (ref 6–29)
AST: 17 U/L (ref 10–35)
Albumin: 3.6 g/dL (ref 3.6–5.1)
Alkaline phosphatase (APISO): 93 U/L (ref 37–153)
BUN/Creatinine Ratio: 22 (calc) (ref 6–22)
BUN: 22 mg/dL (ref 7–25)
CO2: 31 mmol/L (ref 20–32)
Calcium: 9.9 mg/dL (ref 8.6–10.4)
Chloride: 101 mmol/L (ref 98–110)
Creat: 0.98 mg/dL — ABNORMAL HIGH (ref 0.60–0.93)
GFR, Est African American: 66 mL/min/{1.73_m2} (ref >=60)
GFR, Est Non African American: 57 mL/min/{1.73_m2} — ABNORMAL LOW (ref >=60)
Globulin: 3.7 g/dL (calc) (ref 1.9–3.7)
Glucose, Bld: 106 mg/dL — ABNORMAL HIGH (ref 65–99)
Potassium: 3.8 mmol/L (ref 3.5–5.3)
Sodium: 139 mmol/L (ref 135–146)
Total Bilirubin: 0.3 mg/dL (ref 0.2–1.2)
Total Protein: 7.3 g/dL (ref 6.1–8.1)

## 2019-06-13 LAB — URIC ACID: Uric Acid, Serum: 6.1 mg/dL (ref 2.5–7.0)

## 2019-06-14 DIAGNOSIS — D473 Essential (hemorrhagic) thrombocythemia: Secondary | ICD-10-CM | POA: Insufficient documentation

## 2019-06-14 DIAGNOSIS — I4891 Unspecified atrial fibrillation: Secondary | ICD-10-CM | POA: Insufficient documentation

## 2019-06-14 DIAGNOSIS — D509 Iron deficiency anemia, unspecified: Secondary | ICD-10-CM | POA: Insufficient documentation

## 2019-06-14 DIAGNOSIS — D75839 Thrombocytosis, unspecified: Secondary | ICD-10-CM | POA: Insufficient documentation

## 2019-06-14 DIAGNOSIS — Z7901 Long term (current) use of anticoagulants: Secondary | ICD-10-CM | POA: Insufficient documentation

## 2019-06-18 ENCOUNTER — Encounter: Payer: Self-pay | Admitting: Cardiology

## 2019-06-18 ENCOUNTER — Other Ambulatory Visit: Payer: Self-pay

## 2019-06-18 ENCOUNTER — Ambulatory Visit (INDEPENDENT_AMBULATORY_CARE_PROVIDER_SITE_OTHER): Payer: PPO

## 2019-06-18 ENCOUNTER — Ambulatory Visit (INDEPENDENT_AMBULATORY_CARE_PROVIDER_SITE_OTHER): Payer: PPO | Admitting: Cardiology

## 2019-06-18 VITALS — BP 122/60 | HR 80 | Ht 63.0 in | Wt 294.2 lb

## 2019-06-18 DIAGNOSIS — I480 Paroxysmal atrial fibrillation: Secondary | ICD-10-CM

## 2019-06-18 DIAGNOSIS — R0609 Other forms of dyspnea: Secondary | ICD-10-CM

## 2019-06-18 DIAGNOSIS — R0600 Dyspnea, unspecified: Secondary | ICD-10-CM

## 2019-06-18 DIAGNOSIS — I10 Essential (primary) hypertension: Secondary | ICD-10-CM | POA: Diagnosis not present

## 2019-06-18 MED ORDER — METOPROLOL SUCCINATE ER 50 MG PO TB24
25.0000 mg | ORAL_TABLET | Freq: Every day | ORAL | 3 refills | Status: DC
Start: 2019-06-18 — End: 2019-10-25

## 2019-06-18 NOTE — Patient Instructions (Addendum)
Medication Instructions:  Stop taking your Enalapril (Vasotec) Start taking Toprol XL 25mg  (0.5 tablet) once daily by mouth. *If you need a refill on your cardiac medications before your next appointment, please call your pharmacy*   Lab Work: None Ordered If you have labs (blood work) drawn today and your tests are completely normal, you will receive your results only by: Marland Kitchen MyChart Message (if you have MyChart) OR . A paper copy in the mail If you have any lab test that is abnormal or we need to change your treatment, we will call you to review the results.   Testing/Procedures: Your physician has recommended that you wear a Zio monitor. This monitor is a medical device that records the heart's electrical activity. Doctors most often use these monitors to diagnose arrhythmias. Arrhythmias are problems with the speed or rhythm of the heartbeat. The monitor is a small device applied to your chest. You can wear one while you do your normal daily activities. While wearing this monitor if you have any symptoms to push the button and record what you felt. Once you have worn this monitor for the period of time provider prescribed (Usually 14 days), you will return the monitor device in the postage paid box. Once it is returned they will download the data collected and provide [place] with a report which the provider will then review and we will call you with those results. Important tips:  1. Avoid showering during the first 24 hours of wearing the monitor. 2. Avoid excessive sweating to help maximize wear time. 3. Do not submerge the device, no hot tubs, and no swimming pools. 4. Keep any lotions or oils away from the patch. 5. After 24 hours you may shower with the patch on. Take brief showers with your back facing the shower head.  6. Do not remove patch once it has been placed because that will interrupt data and decrease adhesive wear time. 7. Push the button when you have any symptoms and write  down what you were feeling. 8. Once you have completed wearing your monitor, remove and place into box which has postage paid and place in your outgoing mailbox.  9. If for some reason you have misplaced your box then call our office and we can provide another box and/or mail it off for you.        Follow-Up: At [facility], you and your health needs are our priority.  As part of our continuing mission to provide you with exceptional heart care, we have created designated Provider Care Teams.  These Care Teams include your primary Cardiologist (physician) and Advanced Practice Providers (APPs -  Physician Assistants and Nurse Practitioners) who all work together to provide you with the care you need, when you need it.  We recommend signing up for the patient portal called "MyChart".  Sign up information is provided on this After Visit Summary.  MyChart is used to connect with patients for Virtual Visits (Telemedicine).  Patients are able to view lab/test results, encounter notes, upcoming appointments, etc.  Non-urgent messages can be sent to your provider as well.   To learn more about what you can do with MyChart, go to [email or web address].    Your next appointment:   1 month(s)  The format for your next appointment:   In Person  Provider:   Kate Sable, MD   Other Instructions  Metoprolol Extended-Release Tablets What is this medicine? METOPROLOL (me TOE proe lole) is a beta blocker. It decreases  the amount of work your heart has to do and helps your heart beat regularly. It treats high blood pressure and/or prevent chest pain (also called angina). It also treats heart failure. This medicine may be used for other purposes; ask your health care provider or pharmacist if you have questions. COMMON BRAND NAME(S): toprol, Toprol XL What should I tell my health care provider before I take this medicine? They need to know if you have any of these  conditions:  diabetes  heart or vessel disease like slow heart rate, worsening heart failure, heart block, sick sinus syndrome or Raynaud's disease  kidney disease  liver disease  lung or breathing disease, like asthma or emphysema  pheochromocytoma  thyroid disease  an unusual or allergic reaction to metoprolol, other beta-blockers, medicines, foods, dyes, or preservatives  pregnant or trying to get pregnant  breast-feeding How should I use this medicine? Take this drug by mouth. Take it as directed on the prescription label at the same time every day. Take it with food. You may cut the tablet in half if it is scored (has a line in the middle of it). This may help you swallow the tablet if the whole tablet is too big. Be sure to take both halves. Do not take just one-half of the tablet. Keep taking it unless your health care provider tells you to stop. Talk to your health care provider about the use of this drug in children. While it may be prescribed for children as young as 6 for selected conditions, precautions do apply. Overdosage: If you think you have taken too much of this medicine contact a poison control center or emergency room at once. NOTE: This medicine is only for you. Do not share this medicine with others. What if I miss a dose? If you miss a dose, take it as soon as you can. If it is almost time for your next dose, take only that dose. Do not take double or extra doses. What may interact with this medicine? This medicine may interact with the following medications:  certain medicines for blood pressure, heart disease, irregular heart beat  certain medicines for depression, like monoamine oxidase (MAO) inhibitors, fluoxetine, or paroxetine  clonidine  dobutamine  epinephrine  isoproterenol  reserpine This list may not describe all possible interactions. Give your health care provider a list of all the medicines, herbs, non-prescription drugs, or dietary  supplements you use. Also tell them if you smoke, drink alcohol, or use illegal drugs. Some items may interact with your medicine. What should I watch for while using this medicine? Visit your doctor or health care professional for regular check ups. Contact your doctor right away if your symptoms worsen. Check your blood pressure and pulse rate regularly. Ask your health care professional what your blood pressure and pulse rate should be, and when you should contact them. You may get drowsy or dizzy. Do not drive, use machinery, or do anything that needs mental alertness until you know how this medicine affects you. Do not sit or stand up quickly, especially if you are an older patient. This reduces the risk of dizzy or fainting spells. Contact your doctor if these symptoms continue. Alcohol may interfere with the effect of this medicine. Avoid alcoholic drinks. This medicine may increase blood sugar. Ask your healthcare provider if changes in diet or medicines are needed if you have diabetes. What side effects may I notice from receiving this medicine? Side effects that you should report to  your doctor or health care professional as soon as possible:  allergic reactions like skin rash, itching or hives  cold or numb hands or feet  depression  difficulty breathing  faint  fever with sore throat  irregular heartbeat, chest pain  rapid weight gain   signs and symptoms of high blood sugar such as being more thirsty or hungry or having to urinate more than normal. You may also feel very tired or have blurry vision.  swollen legs or ankles Side effects that usually do not require medical attention (report to your doctor or health care professional if they continue or are bothersome):  anxiety or nervousness  change in sex drive or performance  dry skin  headache  nightmares or trouble sleeping  short term memory loss  stomach upset or diarrhea This list may not describe all  possible side effects. Call your doctor for medical advice about side effects. You may report side effects to FDA at 1-800-FDA-1088. Where should I keep my medicine? Keep out of the reach of children and pets. Store at room temperature between 20 and 25 degrees C (68 and 77 degrees F). Throw away any unused drug after the expiration date. NOTE: This sheet is a summary. It may not cover all possible information. If you have questions about this medicine, talk to your doctor, pharmacist, or health care provider.  2020 [employer] Standard (2018-08-24 18:23:00)

## 2019-06-18 NOTE — Progress Notes (Signed)
Cardiology Office Note:    Date:  06/18/2019   ID:  Colleen [name], DOB 12-11-1945, MRN [id number]  PCP:  Delsa Grana, PA-C  Cardiologist:  Kate Sable, MD  Electrophysiologist:  None   Referring MD: Delsa Grana, PA-C   Chief Complaint  Patient presents with  . office visit    4 week F/U after stress test; Meds verbally reviewed with patient.    History of Present Illness:    Colleen [name] is a 74 y.o. female with a hx of hypertension, hyperlipidemia, COPD, PE, paroxysmal atrial fibrillation on Eliquis, former smoker who presents for follow-up.  She was last seen with shortness of breath on exertion.  Lexiscan Myoview was ordered to evaluate ischemia due to patient's risk factors.  She states having occasional palpitations, last occurrence was last evening.  Has appointment to follow-up with hematology after PE treatment with Eliquis.   Historical notes Patient was admitted at [facility] last month due to palpitations.  Chest CTA showed PE.  While in the hospital, telemetry monitor did note paroxysmal atrial fibrillation..  Eliquis was started for PE protocol.  Referral to hematology also placed.   Past Medical History:  Diagnosis Date  . Asthma   . Cataract   . COPD (chronic obstructive pulmonary disease) ([place])   . GERD (gastroesophageal reflux disease)   . Gout   . History of pulmonary embolus (PE)   . Hyperlipidemia   . Hypertension   . Osteoarthrosis   . Prediabetes     Past Surgical History:  Procedure Laterality Date  . BREAST EXCISIONAL BIOPSY Left 04/08/2014   Intraductal papilloma-5 mm  . LUMBAR LAMINECTOMY    . TONSILLECTOMY      Current Medications: Current Meds  Medication Sig  . allopurinol (ZYLOPRIM) 100 MG tablet Take 2 tablets (200 mg total) by mouth daily.  Marland Kitchen aspirin EC 81 MG tablet Take 81 mg by mouth daily.  . Blood Pressure Monitoring (BLOOD PRESSURE KIT) DEVI 1 Device by Does not apply route daily. Fluctuating blood pressure; please get  appropriate size, large cuff  . cholecalciferol (VITAMIN D3) 25 MCG (1000 UNIT) tablet Take 1,000 Units by mouth daily.  Marland Kitchen ELIQUIS 5 MG TABS tablet Take 5 mg by mouth 2 (two) times daily.  . famotidine (PEPCID) 20 MG tablet Take 20 mg by mouth daily as needed for heartburn or indigestion.  . ferrous sulfate 325 (65 FE) MG EC tablet Take 1 tablet (325 mg total) by mouth daily.  . Fluticasone-Umeclidin-Vilant (TRELEGY ELLIPTA) 200-62.5-25 MCG/INH AEPB Inhale 1 puff into the lungs daily.  . hydrochlorothiazide (MICROZIDE) 12.5 MG capsule Take 1 capsule (12.5 mg total) by mouth daily.  Marland Kitchen lovastatin (MEVACOR) 40 MG tablet Take 1 tablet (40 mg total) by mouth at bedtime.  Marland Kitchen PROAIR HFA 108 (90 Base) MCG/ACT inhaler INHALE 2 PUFFS INTO LUNGS EVERY 4 HOURS AS NEEDED FOR WHEEZING FOR SHORTNESS OF BREATH  . simethicone (GAS-X) 80 MG chewable tablet Chew 1 tablet (80 mg total) by mouth 2 (two) times a day.  . triamcinolone cream (KENALOG) 0.1 % APPLY CREAM EXTERNALLY TO AFFECTED AREA TWICE DAILY if needed; not more than one week at a time, then take a break for two weeks  . [DISCONTINUED] enalapril (VASOTEC) 10 MG tablet Take 1 tablet (10 mg total) by mouth daily.     Allergies:   Patient has no known allergies.   Social History   Socioeconomic History  . Marital status: Widowed    Spouse name:  Juvenal  . Number of children: 0  . Years of education: Not on file  . Highest education level: 12th grade  Occupational History  . Occupation: Retired  Tobacco Use  . Smoking status: Former Smoker    Packs/day: 1.50    Years: 20.00    Pack years: 30.00    Types: Cigarettes    Quit date: 11/13/2008    Years since quitting: 10.6  . Smokeless tobacco: Never Used  . Tobacco comment: smoking cessation materials not required  Substance and Sexual Activity  . Alcohol use: Not Currently    Alcohol/week: 1.0 standard drinks    Types: 1 Cans of beer per week  . Drug use: No  . Sexual activity: Not  Currently  Other Topics Concern  . Not on file  Social History Narrative  . Not on file   Social Determinants of Health   Financial Resource Strain:   . Difficulty of Paying Living Expenses:   Food Insecurity:   . Worried About Charity fundraiser in the Last Year:   . Arboriculturist in the Last Year:   Transportation Needs:   . Film/video editor (Medical):   Marland Kitchen Lack of Transportation (Non-Medical):   Physical Activity:   . Days of Exercise per Week:   . Minutes of Exercise per Session:   Stress:   . Feeling of Stress :   Social Connections:   . Frequency of Communication with Friends and Family:   . Frequency of Social Gatherings with Friends and Family:   . Attends Religious Services:   . Active Member of Clubs or Organizations:   . Attends Archivist Meetings:   Marland Kitchen Marital Status:      Family History: The patient's family history includes Congestive Heart Failure in her sister; Diabetes in her mother and sister; Glaucoma in her sister; Lung cancer in her father. There is no history of Breast cancer.  ROS:   Please see the history of present illness.     All other systems reviewed and are negative.  EKGs/Labs/Other Studies Reviewed:    The following studies were reviewed today:   EKG:  EKG is  ordered today.  The ekg ordered today demonstrates sinus rhythm, normal ECG.  Recent Labs: 03/06/2019: Hemoglobin 9.2; Platelets 488; TSH 1.62 06/12/2019: ALT 10; BUN 22; Creat 0.98; Potassium 3.8; Sodium 139  Recent Lipid Panel    Component Value Date/Time   CHOL 150 03/06/2019 0928   CHOL 161 07/30/2014 0830   TRIG 68 03/06/2019 0928   HDL 72 03/06/2019 0928   HDL 82 07/30/2014 0830   CHOLHDL 2.1 03/06/2019 0928   VLDL 15 12/31/2015 0922   LDLCALC 64 03/06/2019 0928    Physical Exam:    VS:  BP 122/60 (BP Location: Right Arm, Patient Position: Sitting, Cuff Size: Large)   Pulse 80   Ht _0  (1.6 m)   Wt 294 lb 4 oz (133.5 kg)   SpO2 97%   BMI  52.12 kg/m     Wt Readings from Last 3 Encounters:  06/18/19 294 lb 4 oz (133.5 kg)  06/12/19 293 lb (132.9 kg)  05/21/19 291 lb (132 kg)     GEN:  Well nourished, well developed in no acute distress, obese HEENT: Normal NECK: No JVD; No carotid bruits LYMPHATICS: No lymphadenopathy CARDIAC: Regular, frequent skipped beats, no murmurs, rubs, gallops RESPIRATORY:  Clear to auscultation without rales, wheezing or rhonchi  ABDOMEN: Soft, non-tender, non-distended MUSCULOSKELETAL:  No edema; No deformity  SKIN: Warm and dry NEUROLOGIC:  Alert and oriented x 3 PSYCHIATRIC:  Normal affect   ASSESSMENT:    1. Dyspnea on exertion   2. Essential hypertension   3. Paroxysmal atrial fibrillation (HCC)    PLAN:    In order of problems listed above:  1. Patient with dyspnea on exertion which has stayed roughly the same for years.  Echocardiogram showed normal systolic function, mild MR, EF 55 to 60%.  Lexiscan Myoview did not reveal any ischemia, low risk study, normal EF.  Her symptoms are likely secondary to COPD. 2. Blood pressure adequately controlled.  Continue HCTZ 12.5 mg daily, start Toprol-XL 25 daily.  Stop Vasotec.  Patient advised to check blood pressure daily.  Plan to titrate BP meds if not well controlled at follow-up visit. 3. Paroxysmal atrial fibrillation.  EKG today is sinus rhythm.  Continue Eliquis as prescribed.  Start Toprol-XL 25 daily, place cardiac monitor x2 weeks to document A. fib burden if any.   Follow-up in 1 month after cardiac monitor.  Total encounter time 45 minutes  Greater than 50% was spent in counseling and coordination of care with the patient Time spent explaining to patient etiologies of A. fib, medication management, reasoning for anticoagulation.  This note was generated in part or whole with voice recognition software. Voice recognition is usually quite accurate but there are transcription errors that can and very often do occur. I apologize  for any typographical errors that were not detected and corrected.  Medication Adjustments/Labs and Tests Ordered: Current medicines are reviewed at length with the patient today.  Concerns regarding medicines are outlined above.  Orders Placed This Encounter  Procedures  . LONG TERM MONITOR (3-14 DAYS)  . EKG 12-Lead   Meds ordered this encounter  Medications  . metoprolol succinate (TOPROL-XL) 50 MG 24 hr tablet    Sig: Take 0.5 tablets (25 mg total) by mouth daily. Take with or immediately following a meal.    Dispense:  15 tablet    Refill:  3    Patient Instructions  Medication Instructions:  Stop taking your Enalapril (Vasotec) Start taking Toprol XL 34m (0.5 tablet) once daily by mouth. *If you need a refill on your cardiac medications before your next appointment, please call your pharmacy*   Lab Work: None Ordered If you have labs (blood work) drawn today and your tests are completely normal, you will receive your results only by: .Marland KitchenMyChart Message (if you have MyChart) OR . A paper copy in the mail If you have any lab test that is abnormal or we need to change your treatment, we will call you to review the results.   Testing/Procedures: Your physician has recommended that you wear a Zio monitor. This monitor is a medical device that records the heart's electrical activity. Doctors most often use these monitors to diagnose arrhythmias. Arrhythmias are problems with the speed or rhythm of the heartbeat. The monitor is a small device applied to your chest. You can wear one while you do your normal daily activities. While wearing this monitor if you have any symptoms to push the button and record what you felt. Once you have worn this monitor for the period of time provider prescribed (Usually 14 days), you will return the monitor device in the postage paid box. Once it is returned they will download the data collected and provide uKoreawith a report which the provider will then  review and we will call  you with those results. Important tips:  1. Avoid showering during the first 24 hours of wearing the monitor. 2. Avoid excessive sweating to help maximize wear time. 3. Do not submerge the device, no hot tubs, and no swimming pools. 4. Keep any lotions or oils away from the patch. 5. After 24 hours you may shower with the patch on. Take brief showers with your back facing the shower head.  6. Do not remove patch once it has been placed because that will interrupt data and decrease adhesive wear time. 7. Push the button when you have any symptoms and write down what you were feeling. 8. Once you have completed wearing your monitor, remove and place into box which has postage paid and place in your outgoing mailbox.  9. If for some reason you have misplaced your box then call our office and we can provide another box and/or mail it off for you.        Follow-Up: At [facility], you and your health needs are our priority.  As part of our continuing mission to provide you with exceptional heart care, we have created designated Provider Care Teams.  These Care Teams include your primary Cardiologist (physician) and Advanced Practice Providers (APPs -  Physician Assistants and Nurse Practitioners) who all work together to provide you with the care you need, when you need it.  We recommend signing up for the patient portal called "MyChart".  Sign up information is provided on this After Visit Summary.  MyChart is used to connect with patients for Virtual Visits (Telemedicine).  Patients are able to view lab/test results, encounter notes, upcoming appointments, etc.  Non-urgent messages can be sent to your provider as well.   To learn more about what you can do with MyChart, go to [email or web address].    Your next appointment:   1 month(s)  The format for your next appointment:   In Person  Provider:   Kate Sable, MD   Other Instructions  Metoprolol  Extended-Release Tablets What is this medicine? METOPROLOL (me TOE proe lole) is a beta blocker. It decreases the amount of work your heart has to do and helps your heart beat regularly. It treats high blood pressure and/or prevent chest pain (also called angina). It also treats heart failure. This medicine may be used for other purposes; ask your health care provider or pharmacist if you have questions. COMMON BRAND NAME(S): toprol, Toprol XL What should I tell my health care provider before I take this medicine? They need to know if you have any of these conditions:  diabetes  heart or vessel disease like slow heart rate, worsening heart failure, heart block, sick sinus syndrome or Raynaud's disease  kidney disease  liver disease  lung or breathing disease, like asthma or emphysema  pheochromocytoma  thyroid disease  an unusual or allergic reaction to metoprolol, other beta-blockers, medicines, foods, dyes, or preservatives  pregnant or trying to get pregnant  breast-feeding How should I use this medicine? Take this drug by mouth. Take it as directed on the prescription label at the same time every day. Take it with food. You may cut the tablet in half if it is scored (has a line in the middle of it). This may help you swallow the tablet if the whole tablet is too big. Be sure to take both halves. Do not take just one-half of the tablet. Keep taking it unless your health care provider tells you to stop. Talk to your health  care provider about the use of this drug in children. While it may be prescribed for children as young as 6 for selected conditions, precautions do apply. Overdosage: If you think you have taken too much of this medicine contact a poison control center or emergency room at once. NOTE: This medicine is only for you. Do not share this medicine with others. What if I miss a dose? If you miss a dose, take it as soon as you can. If it is almost time for your next  dose, take only that dose. Do not take double or extra doses. What may interact with this medicine? This medicine may interact with the following medications:  certain medicines for blood pressure, heart disease, irregular heart beat  certain medicines for depression, like monoamine oxidase (MAO) inhibitors, fluoxetine, or paroxetine  clonidine  dobutamine  epinephrine  isoproterenol  reserpine This list may not describe all possible interactions. Give your health care provider a list of all the medicines, herbs, non-prescription drugs, or dietary supplements you use. Also tell them if you smoke, drink alcohol, or use illegal drugs. Some items may interact with your medicine. What should I watch for while using this medicine? Visit your doctor or health care professional for regular check ups. Contact your doctor right away if your symptoms worsen. Check your blood pressure and pulse rate regularly. Ask your health care professional what your blood pressure and pulse rate should be, and when you should contact them. You may get drowsy or dizzy. Do not drive, use machinery, or do anything that needs mental alertness until you know how this medicine affects you. Do not sit or stand up quickly, especially if you are an older patient. This reduces the risk of dizzy or fainting spells. Contact your doctor if these symptoms continue. Alcohol may interfere with the effect of this medicine. Avoid alcoholic drinks. This medicine may increase blood sugar. Ask your healthcare provider if changes in diet or medicines are needed if you have diabetes. What side effects may I notice from receiving this medicine? Side effects that you should report to your doctor or health care professional as soon as possible:  allergic reactions like skin rash, itching or hives  cold or numb hands or feet  depression  difficulty breathing  faint  fever with sore throat  irregular heartbeat, chest pain  rapid  weight gain   signs and symptoms of high blood sugar such as being more thirsty or hungry or having to urinate more than normal. You may also feel very tired or have blurry vision.  swollen legs or ankles Side effects that usually do not require medical attention (report to your doctor or health care professional if they continue or are bothersome):  anxiety or nervousness  change in sex drive or performance  dry skin  headache  nightmares or trouble sleeping  short term memory loss  stomach upset or diarrhea This list may not describe all possible side effects. Call your doctor for medical advice about side effects. You may report side effects to FDA at 1-800-FDA-1088. Where should I keep my medicine? Keep out of the reach of children and pets. Store at room temperature between 20 and 25 degrees C (68 and 77 degrees F). Throw away any unused drug after the expiration date. NOTE: This sheet is a summary. It may not cover all possible information. If you have questions about this medicine, talk to your doctor, pharmacist, or health care provider.  2020 Elsevier/Gold Standard (2018-08-24  18:23:00)     Signed, Kate Sable, MD  06/18/2019 11:06 AM    [place]

## 2019-07-11 ENCOUNTER — Telehealth: Payer: Self-pay | Admitting: Family Medicine

## 2019-07-11 NOTE — Chronic Care Management (AMB) (Signed)
  Care Management   Note  07/11/2019 Name: Colleen [name] MRN: [id number] DOB: 1945-11-26  Colleen [name] is a 74 y.o. year old female who is a primary care patient of Laurell Roof and is actively engaged with the care management team. I reached out to Darylene Price by phone today to assist with scheduling a follow up visit with the RN Case Manager  Follow up plan: Unsuccessful telephone outreach attempt made. The care management team will reach out to the patient again over the next 7 days.   Noreene Larsson, [place], [place], [place] [address] Direct Dial: [phone] [email or web address] Website: [email or web address]

## 2019-07-16 NOTE — Chronic Care Management (AMB) (Signed)
  Care Management   Note  07/16/2019 Name: Colleen [name] MRN: [id number] DOB: 24-May-1945  Colleen [name] is a 74 y.o. year old female who is a primary care patient of Delsa Grana, Hershal Coria and is actively engaged with the care management team. I reached out to Darylene Price by phone today to assist with scheduling a follow up visit with the RN Case Manager  Follow up plan: Patient declines further follow up and engagement by the care management team. Appropriate care team members and provider have been notified via electronic communication.  The care management team is available to follow up with the patient after provider conversation with the patient regarding recommendation for care management engagement and subsequent re-referral to the care management team.   Noreene Larsson, [place], [place], [place] [address] Direct Dial: [phone] [email or web address]: [email or web address]

## 2019-07-20 DIAGNOSIS — R0600 Dyspnea, unspecified: Secondary | ICD-10-CM | POA: Diagnosis not present

## 2019-07-20 DIAGNOSIS — I480 Paroxysmal atrial fibrillation: Secondary | ICD-10-CM | POA: Diagnosis not present

## 2019-07-24 ENCOUNTER — Other Ambulatory Visit: Payer: Self-pay

## 2019-07-24 ENCOUNTER — Ambulatory Visit (INDEPENDENT_AMBULATORY_CARE_PROVIDER_SITE_OTHER): Payer: PPO | Admitting: Cardiology

## 2019-07-24 ENCOUNTER — Encounter: Payer: Self-pay | Admitting: Cardiology

## 2019-07-24 VITALS — BP 126/60 | HR 80 | Ht 64.0 in | Wt 308.4 lb

## 2019-07-24 DIAGNOSIS — I10 Essential (primary) hypertension: Secondary | ICD-10-CM | POA: Diagnosis not present

## 2019-07-24 DIAGNOSIS — I480 Paroxysmal atrial fibrillation: Secondary | ICD-10-CM | POA: Diagnosis not present

## 2019-07-24 DIAGNOSIS — R600 Localized edema: Secondary | ICD-10-CM | POA: Diagnosis not present

## 2019-07-24 NOTE — Progress Notes (Signed)
Cardiology Office Note:    Date:  07/24/2019   ID:  Colleen [name], DOB 1945/12/19, MRN [id number]  PCP:  Delsa Grana, PA-C  Cardiologist:  Kate Sable, MD  Electrophysiologist:  None   Referring MD: Delsa Grana, PA-C   Chief Complaint  Patient presents with  . office visit    Pt has swelling in feet/ pt concerned w/ BP meds. Meds verbally reviewed w/ pt.    History of Present Illness:    Colleen [name] is a 74 y.o. female with a hx of hypertension, hyperlipidemia, COPD, PE, paroxysmal atrial fibrillation on Eliquis, former smoker who presents for follow-up.  Previously seen for shortness of breath.  Echocardiogram was normal, Lexiscan Myoview with no evidence for ischemia.  Patient states having occasional palpitations.  Cardiac monitor was placed to document A. fib burden.  Toprol-XL was started after last visit.  She states symptoms of palpitations have improved since starting Toprol-XL.  She notes trace lower extremity edema over the past several weeks.  She usually stays with her foot down for a long periods.  She has an appointment with weight hematology next month with regards to PE and coagulation work-up.    Historical notes Patient was admitted at [facility] due to palpitations.  Chest CTA showed PE.  While in the hospital, telemetry monitor did note paroxysmal atrial fibrillation..  Eliquis was started for PE protocol.  Referral to hematology also placed.  Echocardiogram showed normal systolic and diastolic function, Lexiscan Myoview with no evidence of ischemia.  Dyspnea on exertion was contributed to COPD.   Past Medical History:  Diagnosis Date  . Asthma   . Cataract   . COPD (chronic obstructive pulmonary disease) ([place])   . GERD (gastroesophageal reflux disease)   . Gout   . History of pulmonary embolus (PE)   . Hyperlipidemia   . Hypertension   . Osteoarthrosis   . Prediabetes     Past Surgical History:  Procedure Laterality Date  . BREAST EXCISIONAL  BIOPSY Left 04/08/2014   Intraductal papilloma-5 mm  . LUMBAR LAMINECTOMY    . TONSILLECTOMY      Current Medications: Current Meds  Medication Sig  . allopurinol (ZYLOPRIM) 100 MG tablet Take 2 tablets (200 mg total) by mouth daily.  Marland Kitchen aspirin EC 81 MG tablet Take 81 mg by mouth daily.  . Blood Pressure Monitoring (BLOOD PRESSURE KIT) DEVI 1 Device by Does not apply route daily. Fluctuating blood pressure; please get appropriate size, large cuff  . cholecalciferol (VITAMIN D3) 25 MCG (1000 UNIT) tablet Take 1,000 Units by mouth daily.  Marland Kitchen ELIQUIS 5 MG TABS tablet Take 5 mg by mouth 2 (two) times daily.  . famotidine (PEPCID) 20 MG tablet Take 20 mg by mouth daily as needed for heartburn or indigestion.  . ferrous sulfate 325 (65 FE) MG EC tablet Take 1 tablet (325 mg total) by mouth daily.  . hydrochlorothiazide (MICROZIDE) 12.5 MG capsule Take 1 capsule (12.5 mg total) by mouth daily.  Marland Kitchen lovastatin (MEVACOR) 40 MG tablet Take 1 tablet (40 mg total) by mouth at bedtime.  . metoprolol succinate (TOPROL-XL) 50 MG 24 hr tablet Take 0.5 tablets (25 mg total) by mouth daily. Take with or immediately following a meal.  . PROAIR HFA 108 (90 Base) MCG/ACT inhaler INHALE 2 PUFFS INTO LUNGS EVERY 4 HOURS AS NEEDED FOR WHEEZING FOR SHORTNESS OF BREATH  . simethicone (GAS-X) 80 MG chewable tablet Chew 1 tablet (80 mg total) by mouth  2 (two) times a day.  . SYMBICORT 160-4.5 MCG/ACT inhaler Inhale 2 puffs into the lungs 2 (two) times daily.  Marland Kitchen triamcinolone cream (KENALOG) 0.1 % APPLY CREAM EXTERNALLY TO AFFECTED AREA TWICE DAILY if needed; not more than one week at a time, then take a break for two weeks     Allergies:   Patient has no known allergies.   Social History   Socioeconomic History  . Marital status: Widowed    Spouse name: Colleen [name]  . Number of children: 0  . Years of education: Not on file  . Highest education level: 12th grade  Occupational History  . Occupation: Retired    Tobacco Use  . Smoking status: Former Smoker    Packs/day: 1.50    Years: 20.00    Pack years: 30.00    Types: Cigarettes    Quit date: 11/13/2008    Years since quitting: 10.6  . Smokeless tobacco: Never Used  . Tobacco comment: smoking cessation materials not required  Vaping Use  . Vaping Use: Never used  Substance and Sexual Activity  . Alcohol use: Not Currently    Alcohol/week: 1.0 standard drink    Types: 1 Cans of beer per week  . Drug use: No  . Sexual activity: Not Currently  Other Topics Concern  . Not on file  Social History Narrative  . Not on file   Social Determinants of Health   Financial Resource Strain:   . Difficulty of Paying Living Expenses:   Food Insecurity:   . Worried About Charity fundraiser in the Last Year:   . Arboriculturist in the Last Year:   Transportation Needs:   . Film/video editor (Medical):   Marland Kitchen Lack of Transportation (Non-Medical):   Physical Activity:   . Days of Exercise per Week:   . Minutes of Exercise per Session:   Stress:   . Feeling of Stress :   Social Connections:   . Frequency of Communication with Friends and Family:   . Frequency of Social Gatherings with Friends and Family:   . Attends Religious Services:   . Active Member of Clubs or Organizations:   . Attends Archivist Meetings:   Marland Kitchen Marital Status:      Family History: The patient's family history includes Congestive Heart Failure in her sister; Diabetes in her mother and sister; Glaucoma in her sister; Lung cancer in her father. There is no history of Breast cancer.  ROS:   Please see the history of present illness.     All other systems reviewed and are negative.  EKGs/Labs/Other Studies Reviewed:    The following studies were reviewed today:   EKG:  EKG is  ordered today.  The ekg ordered today demonstrates sinus rhythm, sinus arrhythmia, normal ECG.  Recent Labs: 03/06/2019: Hemoglobin 9.2; Platelets 488; TSH 1.62 06/12/2019: ALT  10; BUN 22; Creat 0.98; Potassium 3.8; Sodium 139  Recent Lipid Panel    Component Value Date/Time   CHOL 150 03/06/2019 0928   CHOL 161 07/30/2014 0830   TRIG 68 03/06/2019 0928   HDL 72 03/06/2019 0928   HDL 82 07/30/2014 0830   CHOLHDL 2.1 03/06/2019 0928   VLDL 15 12/31/2015 0922   LDLCALC 64 03/06/2019 0928    Physical Exam:    VS:  BP 126/60 (BP Location: Left Arm, Patient Position: Sitting, Cuff Size: Large)   Pulse 80   Ht _0  (1.626 m)   Wt (!) 308  lb 6 oz (139.9 kg)   SpO2 97%   BMI 52.93 kg/m     Wt Readings from Last 3 Encounters:  07/24/19 (!) 308 lb 6 oz (139.9 kg)  06/18/19 294 lb 4 oz (133.5 kg)  06/12/19 293 lb (132.9 kg)     GEN:  Well nourished, well developed in no acute distress, obese HEENT: Normal NECK: No JVD; No carotid bruits LYMPHATICS: No lymphadenopathy CARDIAC: Regular, frequent skipped beats, no murmurs, rubs, gallops RESPIRATORY:  Clear to auscultation without rales, wheezing or rhonchi  ABDOMEN: Soft, non-tender, non-distended MUSCULOSKELETAL:  trace edema; No deformity  SKIN: Warm and dry NEUROLOGIC:  Alert and oriented x 3 PSYCHIATRIC:  Normal affect   ASSESSMENT:    1. Essential hypertension   2. Paroxysmal atrial fibrillation (HCC)   3. Edema leg    PLAN:    In order of problems listed above:  1. Blood pressure adequately controlled.  Continue HCTZ 12.5 mg daily, Toprol-XL 25 daily. Patient advised to check blood pressure daily.  Low-salt diet advised. 2. Patient with history of paroxysmal atrial fibrillation and palpitations.  2-week cardiac monitor showed frequent PACs, no evidence of A. fib.  Continue Eliquis, Toprol-XL 25 daily. 3. Patient with trace bilateral lower extremity edema.  Prior echocardiogram showed normal systolic and diastolic function.  Edema is likely due to venous insufficiency as a result of morbid obesity.  Patient advised to keep legs raised while in a seated position.   Follow-up in 3  months  Total encounter time 43 minutes  Greater than 50% was spent in counseling and coordination of care with the patient  This note was generated in part or whole with voice recognition software. Voice recognition is usually quite accurate but there are transcription errors that can and very often do occur. I apologize for any typographical errors that were not detected and corrected.  Medication Adjustments/Labs and Tests Ordered: Current medicines are reviewed at length with the patient today.  Concerns regarding medicines are outlined above.  Orders Placed This Encounter  Procedures  . EKG 12-Lead   No orders of the defined types were placed in this encounter.   Patient Instructions  Medication Instructions:   Your physician recommends that you continue on your current medications as directed. Please refer to the Current Medication list given to you today.  *If you need a refill on your cardiac medications before your next appointment, please call your pharmacy*   Lab Work: None Ordered If you have labs (blood work) drawn today and your tests are completely normal, you will receive your results only by: Marland Kitchen MyChart Message (if you have MyChart) OR . A paper copy in the mail If you have any lab test that is abnormal or we need to change your treatment, we will call you to review the results.   Testing/Procedures: None Ordered   Follow-Up: At [facility], you and your health needs are our priority.  As part of our continuing mission to provide you with exceptional heart care, we have created designated Provider Care Teams.  These Care Teams include your primary Cardiologist (physician) and Advanced Practice Providers (APPs -  Physician Assistants and Nurse Practitioners) who all work together to provide you with the care you need, when you need it.  We recommend signing up for the patient portal called "MyChart".  Sign up information is provided on this After Visit  Summary.  MyChart is used to connect with patients for Virtual Visits (Telemedicine).  Patients are able  to view lab/test results, encounter notes, upcoming appointments, etc.  Non-urgent messages can be sent to your provider as well.   To learn more about what you can do with MyChart, go to [email or web address].    Your next appointment:   3 month(s)  The format for your next appointment:   In Person  Provider:   Kate Sable, MD   Other Instructions N/A     Signed, Kate Sable, MD  07/24/2019 9:35 AM    [place]

## 2019-07-24 NOTE — Patient Instructions (Signed)
Medication Instructions:   Your physician recommends that you continue on your current medications as directed. Please refer to the Current Medication list given to you today.  *If you need a refill on your cardiac medications before your next appointment, please call your pharmacy*   Lab Work: None Ordered If you have labs (blood work) drawn today and your tests are completely normal, you will receive your results only by: Marland Kitchen MyChart Message (if you have MyChart) OR . A paper copy in the mail If you have any lab test that is abnormal or we need to change your treatment, we will call you to review the results.   Testing/Procedures: None Ordered   Follow-Up: At [facility], you and your health needs are our priority.  As part of our continuing mission to provide you with exceptional heart care, we have created designated Provider Care Teams.  These Care Teams include your primary Cardiologist (physician) and Advanced Practice Providers (APPs -  Physician Assistants and Nurse Practitioners) who all work together to provide you with the care you need, when you need it.  We recommend signing up for the patient portal called "MyChart".  Sign up information is provided on this After Visit Summary.  MyChart is used to connect with patients for Virtual Visits (Telemedicine).  Patients are able to view lab/test results, encounter notes, upcoming appointments, etc.  Non-urgent messages can be sent to your provider as well.   To learn more about what you can do with MyChart, go to [email or web address].    Your next appointment:   3 month(s)  The format for your next appointment:   In Person  Provider:   Kate Sable, MD   Other Instructions N/A

## 2019-07-27 DIAGNOSIS — J069 Acute upper respiratory infection, unspecified: Secondary | ICD-10-CM | POA: Diagnosis not present

## 2019-08-08 DIAGNOSIS — I2699 Other pulmonary embolism without acute cor pulmonale: Secondary | ICD-10-CM | POA: Diagnosis not present

## 2019-08-08 DIAGNOSIS — D509 Iron deficiency anemia, unspecified: Secondary | ICD-10-CM | POA: Diagnosis not present

## 2019-08-08 DIAGNOSIS — D473 Essential (hemorrhagic) thrombocythemia: Secondary | ICD-10-CM | POA: Diagnosis not present

## 2019-08-16 ENCOUNTER — Telehealth: Payer: Self-pay

## 2019-08-16 NOTE — Telephone Encounter (Signed)
Eliquis clearance was faxed to patient's hematologist at [facility]. Once we get clearance to stop Eliquis. Patient will be contacted to schedule her an EGD and colonoscopy per Dr. Bonna Gains for IDA.

## 2019-08-16 NOTE — Telephone Encounter (Signed)
-----   Message from Virgel Manifold, MD sent at 08/16/2019  1:23 PM EDT ----- egd colon with hematology clearance for eliquis. Schedule at [facility]

## 2019-08-24 NOTE — Telephone Encounter (Signed)
Dr. Bonna Gains, we finally received clearance from patient's hematologist. However, when I called patient to schedule her EGD and Colonoscopy, patient declined. Patient stated that she had several appointments coming up including an iron infusion this coming Monday and follow up appointment with her PCP. Patient's hematologist recommended for the patient to stop Eliquis 5 MG 2 days prior to procedure and restart blood thinner ASAP after procedure.

## 2019-08-24 NOTE — Telephone Encounter (Signed)
Clearance was obtained by Dr. Hulan Amato. This can be found in patient's Media. Please read other phone note.

## 2019-08-27 ENCOUNTER — Telehealth: Payer: Self-pay

## 2019-08-27 DIAGNOSIS — D509 Iron deficiency anemia, unspecified: Secondary | ICD-10-CM | POA: Diagnosis not present

## 2019-08-27 NOTE — Telephone Encounter (Signed)
Virgel Manifold, MD  You 3 days ago   Set recall for 1 month

## 2019-08-30 ENCOUNTER — Telehealth: Payer: Self-pay | Admitting: Cardiology

## 2019-08-30 NOTE — Telephone Encounter (Signed)
Pt c/o medication issue:  1. Name of Medication: metoprolol   2. How are you currently taking this medication (dosage and times per day)? stopped  3. Are you having a reaction (difficulty breathing--STAT)? Gi upset   4. What is your medication issue? Patient stopped taking and reverted back to previously taken Enalapril 10 mg po q d

## 2019-08-30 NOTE — Telephone Encounter (Signed)
Spoke with patient and she stated that she stopped her Metoprolol because her stomach is sensitive and she had been waking up 2-3X a night. She also reported frequent headaches. After stopping she has not experienced these symptoms. She had restarted her Enalapril 10mg  QD that she reported she had taken for years, and her BP has remained WNL. She stated she had an Iron infusion on Monday and her BP was normal throughout.   Patient stated she would like to remain on the Enalapril until her follow up appointment with Dr. Mylo Red on 9/30. And will inform us if her BP rises.   I informed her that I would forward this information to Dr. Mylo Red, and let her know if he has any further recommendations at this time.

## 2019-08-30 NOTE — Telephone Encounter (Signed)
Patient returning call.

## 2019-09-06 ENCOUNTER — Ambulatory Visit: Payer: PPO

## 2019-09-23 ENCOUNTER — Other Ambulatory Visit: Payer: Self-pay | Admitting: Family Medicine

## 2019-09-23 DIAGNOSIS — I10 Essential (primary) hypertension: Secondary | ICD-10-CM

## 2019-10-11 ENCOUNTER — Ambulatory Visit: Payer: PPO | Admitting: Family Medicine

## 2019-10-15 NOTE — Progress Notes (Signed)
Patient ID: Colleen [name], female    DOB: [birth year], 74 y.o.   MRN: [id number]  PCP: Delsa Grana, PA-C  Chief Complaint  Patient presents with  . Knee Pain    bilateral knee pain, right worst then left. maninly when walking    Subjective:   Colleen [name] is a 74 y.o. female, presents to clinic with CC of the following:  Bilateral knee pain secondary to OA, advised more than  5 years ago she needed TKR but she needed to loose weight prior to surgery.  She continues to deal with pain and worsening mobility, uses a rolling walker.   Knee Pain  The pain is at a severity of 8/10 (worse with walking and weight bearing, no pain with sitting). She has tried acetaminophen for the symptoms. The treatment provided mild relief.      She has anemia - being managed by hematology - taking oral iron and has also gotten injusions Last labs were in July She is also seeing GI for upper GI and colonoscopy (hold eliquis x 2 d prior to procedure - seeing [facility]) Heme also managing eliquis - has been on for > 6 months pt states she is going to continue NOAC for longer term tx of unprovoked VTE No bleeding concerns today  HTN- well controlled on HCTZ and enalipril - stable renal function  Patient Active Problem List   Diagnosis Date Noted  . Atrial fibrillation ([place]) 06/14/2019  . Current use of anticoagulant therapy 06/14/2019  . Iron deficiency anemia 06/14/2019  . Thrombocytosis ([place]) 06/14/2019  . GERD (gastroesophageal reflux disease) 04/15/2019  . Chronic low back pain 04/15/2019  . Right pulmonary embolus ([place]) 04/15/2019  . Palpitations 03/13/2019  . DOE (dyspnea on exertion) 03/13/2019  . Anemia 03/07/2019  . Atherosclerosis of both carotid arteries 09/20/2018  . Spinal stenosis of lumbar region with neurogenic claudication 09/20/2018  . Near syncope 09/10/2018  . Aortic atherosclerosis ([place]) 09/05/2018  . At high risk for falls 09/05/2018  . Morbid obesity with BMI of  45.0-49.9, adult ([place]) 08/29/2017  . Osteopenia 05/18/2017  . Stage 3 chronic kidney disease 04/21/2017  . Morbid obesity ([place]) 04/21/2017  . Trigger middle finger of left hand 02/28/2017  . Chronic gouty arthropathy without tophi 11/29/2016  . Hypergammaglobulinemia 11/29/2016  . Osteoarthritis of both knees 11/29/2016  . Bilateral primary osteoarthritis of knee 11/29/2016  . Allergic rhinitis, seasonal 07/30/2014  . Gout 07/30/2014  . HLD (hyperlipidemia) 07/30/2014  . Osteoarthritis of knee 07/30/2014  . Prediabetes 07/30/2014  . Asthma, mild persistent 07/30/2014  . COPD (chronic obstructive pulmonary disease) ([place]) 07/09/2008  . Essential hypertension 06/22/2006      Current Outpatient Medications:  .  allopurinol (ZYLOPRIM) 100 MG tablet, Take 2 tablets (200 mg total) by mouth daily., Disp: 180 tablet, Rfl: 3 .  aspirin EC 81 MG tablet, Take 81 mg by mouth daily., Disp: , Rfl:  .  Blood Pressure Monitoring (BLOOD PRESSURE KIT) DEVI, 1 Device by Does not apply route daily. Fluctuating blood pressure; please get appropriate size, large cuff, Disp: 1 Device, Rfl: 0 .  cholecalciferol (VITAMIN D3) 25 MCG (1000 UNIT) tablet, Take 1,000 Units by mouth daily., Disp: , Rfl:  .  ELIQUIS 5 MG TABS tablet, Take 5 mg by mouth 2 (two) times daily., Disp: , Rfl:  .  enalapril (VASOTEC) 10 MG tablet, Take 10 mg by mouth daily., Disp: , Rfl:  .  famotidine (PEPCID) 20 MG tablet, Take  20 mg by mouth daily as needed for heartburn or indigestion., Disp: , Rfl:  .  ferrous sulfate 325 (65 FE) MG EC tablet, Take 1 tablet (325 mg total) by mouth daily., Disp: 90 tablet, Rfl: 1 .  hydrochlorothiazide (MICROZIDE) 12.5 MG capsule, Take 1 capsule by mouth once daily, Disp: 90 capsule, Rfl: 3 .  lovastatin (MEVACOR) 40 MG tablet, Take 1 tablet (40 mg total) by mouth at bedtime., Disp: 90 tablet, Rfl: 3 .  PROAIR HFA 108 (90 Base) MCG/ACT inhaler, INHALE 2 PUFFS INTO LUNGS EVERY 4 HOURS AS NEEDED FOR  WHEEZING FOR SHORTNESS OF BREATH, Disp: 18 g, Rfl: 5 .  simethicone (GAS-X) 80 MG chewable tablet, Chew 1 tablet (80 mg total) by mouth 2 (two) times a day., Disp: 60 tablet, Rfl: 2 .  SYMBICORT 160-4.5 MCG/ACT inhaler, Inhale 2 puffs into the lungs 2 (two) times daily., Disp: 33 g, Rfl: 5 .  triamcinolone cream (KENALOG) 0.1 %, APPLY CREAM EXTERNALLY TO AFFECTED AREA TWICE DAILY if needed; not more than one week at a time, then take a break for two weeks, Disp: 80 g, Rfl: 0 .  metoprolol succinate (TOPROL-XL) 50 MG 24 hr tablet, Take 0.5 tablets (25 mg total) by mouth daily. Take with or immediately following a meal. (Patient not taking: Reported on 08/30/2019), Disp: 15 tablet, Rfl: 3   No Known Allergies   Social History   Tobacco Use  . Smoking status: Former Smoker    Packs/day: 1.50    Years: 20.00    Pack years: 30.00    Types: Cigarettes    Quit date: 11/13/2008    Years since quitting: 10.9  . Smokeless tobacco: Never Used  . Tobacco comment: smoking cessation materials not required  Vaping Use  . Vaping Use: Never used  Substance Use Topics  . Alcohol use: Not Currently    Alcohol/week: 1.0 standard drink    Types: 1 Cans of beer per week  . Drug use: No      Chart Review Today: I personally reviewed active problem list, medication list, allergies, family history, social history, health maintenance, notes from last encounter, lab results, imaging with the patient/caregiver today.   Review of Systems 10 Systems reviewed and are negative for acute change except as noted in the HPI.     Objective:   Vitals:   10/16/19 1016  BP: 122/78  Pulse: 84  Resp: 16  Temp: 98.1 F (36.7 C)  TempSrc: Oral  SpO2: 93%  Weight: 284 lb 8 oz (129 kg)  Height: '5\' 4"'  (1.626 m)    Body mass index is 48.83 kg/m.  Physical Exam Vitals and nursing note reviewed.  Constitutional:      General: She is not in acute distress.    Appearance: She is obese. She is not  toxic-appearing or diaphoretic.  HENT:     Head: Normocephalic.  Eyes:     General:        Right eye: No discharge.        Left eye: No discharge.     Conjunctiva/sclera: Conjunctivae normal.  Cardiovascular:     Rate and Rhythm: Normal rate and regular rhythm.     Pulses: Normal pulses.     Heart sounds: Normal heart sounds.  Pulmonary:     Effort: Pulmonary effort is normal.     Breath sounds: Normal breath sounds.  Neurological:     Gait: Gait abnormal.  Psychiatric:        Mood  and Affect: Mood normal.        Behavior: Behavior normal.      Results for orders placed or performed in visit on 06/12/19  COMPLETE METABOLIC PANEL WITH GFR  Result Value Ref Range   Glucose, Bld 106 (H) 65 - 99 mg/dL   BUN 22 7 - 25 mg/dL   Creat 0.98 (H) 0.60 - 0.93 mg/dL   GFR, Est Non African American 57 (L) > OR = 60 mL/min/1.22m   GFR, Est African American 66 > OR = 60 mL/min/1.765m  BUN/Creatinine Ratio 22 6 - 22 (calc)   Sodium 139 135 - 146 mmol/L   Potassium 3.8 3.5 - 5.3 mmol/L   Chloride 101 98 - 110 mmol/L   CO2 31 20 - 32 mmol/L   Calcium 9.9 8.6 - 10.4 mg/dL   Total Protein 7.3 6.1 - 8.1 g/dL   Albumin 3.6 3.6 - 5.1 g/dL   Globulin 3.7 1.9 - 3.7 g/dL (calc)   AG Ratio 1.0 1.0 - 2.5 (calc)   Total Bilirubin 0.3 0.2 - 1.2 mg/dL   Alkaline phosphatase (APISO) 93 37 - 153 U/L   AST 17 10 - 35 U/L   ALT 10 6 - 29 U/L  Uric acid  Result Value Ref Range   Uric Acid, Serum 6.1 2.5 - 7.0 mg/dL       Assessment & Plan:     ICD-10-CM   1. Primary osteoarthritis of both knees  M17.0 Ambulatory referral to Orthopedic Surgery   worsening pain and mobility - pt would like to see ortho again, known OA - reconnect with knee surgeon  2. Need for influenza vaccination  Z23 Flu Vaccine QUAD High Dose(Fluad)    For #1 - continue OTC meds, pain is worse with weight bearing Reestablish with [facility] - previously saw ChRachelle Horand Dr. MeRudene ChristiansReason for Referral: Bilateral OA  knees   Was previously told the total knee surgery would be tx option, needed to loose weight many years ago Encouraged pt to at least reestablish with ortho - may be able to get injections or see what else could help Discussed TKR and other management options     LeDelsa GranaPA-C 10/16/19 10:41 AM

## 2019-10-16 ENCOUNTER — Other Ambulatory Visit: Payer: Self-pay

## 2019-10-16 ENCOUNTER — Ambulatory Visit (INDEPENDENT_AMBULATORY_CARE_PROVIDER_SITE_OTHER): Payer: PPO | Admitting: Family Medicine

## 2019-10-16 ENCOUNTER — Encounter: Payer: Self-pay | Admitting: Family Medicine

## 2019-10-16 VITALS — BP 122/78 | HR 84 | Temp 98.1°F | Resp 16 | Ht 64.0 in | Wt 284.5 lb

## 2019-10-16 DIAGNOSIS — Z23 Encounter for immunization: Secondary | ICD-10-CM

## 2019-10-16 DIAGNOSIS — M170 Bilateral primary osteoarthritis of knee: Secondary | ICD-10-CM | POA: Diagnosis not present

## 2019-10-16 NOTE — Patient Instructions (Addendum)
You can take tylenol 650 -1000 mg up to three times a day as needed for pain/osteoarthritis     Osteoarthritis  Osteoarthritis is a type of arthritis that affects tissue that covers the ends of bones in joints (cartilage). Cartilage acts as a cushion between the bones and helps them move smoothly. Osteoarthritis results when cartilage in the joints gets worn down. Osteoarthritis is sometimes called "wear and tear" arthritis. Osteoarthritis is the most common form of arthritis. It often occurs in older people. It is a condition that gets worse over time (a progressive condition). Joints that are most often affected by this condition are in:  Fingers.  Toes.  Hips.  Knees.  Spine, including neck and lower back. What are the causes? This condition is caused by age-related wearing down of cartilage that covers the ends of bones. What increases the risk? The following factors may make you more likely to develop this condition:  Older age.  Being overweight or obese.  Overuse of joints, such as in athletes.  Past injury of a joint.  Past surgery on a joint.  Family history of osteoarthritis. What are the signs or symptoms? The main symptoms of this condition are pain, swelling, and stiffness in the joint. The joint may lose its shape over time. Small pieces of bone or cartilage may break off and float inside of the joint, which may cause more pain and damage to the joint. Small deposits of bone (osteophytes) may grow on the edges of the joint. Other symptoms may include:  A grating or scraping feeling inside the joint when you move it.  Popping or creaking sounds when you move. Symptoms may affect one or more joints. Osteoarthritis in a major joint, such as your knee or hip, can make it painful to walk or exercise. If you have osteoarthritis in your hands, you might not be able to grip items, twist your hand, or control small movements of your hands and fingers (fine motor  skills). How is this diagnosed? This condition may be diagnosed based on:  Your medical history.  A physical exam.  Your symptoms.  X-rays of the affected joint(s).  Blood tests to rule out other types of arthritis. How is this treated? There is no cure for this condition, but treatment can help to control pain and improve joint function. Treatment plans may include:  A prescribed exercise program that allows for rest and joint relief. You may work with a physical therapist.  A weight control plan.  Pain relief techniques, such as: ? Applying heat and cold to the joint. ? Electric pulses delivered to nerve endings under the skin (transcutaneous electrical nerve stimulation, or TENS). ? Massage. ? Certain nutritional supplements.  NSAIDs or prescription medicines to help relieve pain.  Medicine to help relieve pain and inflammation (corticosteroids). This can be given by mouth (orally) or as an injection.  Assistive devices, such as a brace, wrap, splint, specialized glove, or cane.  Surgery, such as: ? An osteotomy. This is done to reposition the bones and relieve pain or to remove loose pieces of bone and cartilage. ? Joint replacement surgery. You may need this surgery if you have very bad (advanced) osteoarthritis. Follow these instructions at home: Activity  Rest your affected joints as directed by your health care provider.  Do not drive or use heavy machinery while taking prescription pain medicine.  Exercise as directed. Your health care provider or physical therapist may recommend specific types of exercise, such as: ?  Strengthening exercises. These are done to strengthen the muscles that support joints that are affected by arthritis. They can be performed with weights or with exercise bands to add resistance. ? Aerobic activities. These are exercises, such as brisk walking or water aerobics, that get your heart pumping. ? Range-of-motion activities. These keep  your joints easy to move. ? Balance and agility exercises. Managing pain, stiffness, and swelling      If directed, apply heat to the affected area as often as told by your health care provider. Use the heat source that your health care provider recommends, such as a moist heat pack or a heating pad. ? If you have a removable assistive device, remove it as told by your health care provider. ? Place a towel between your skin and the heat source. If your health care provider tells you to keep the assistive device on while you apply heat, place a towel between the assistive device and the heat source. ? Leave the heat on for 20-30 minutes. ? Remove the heat if your skin turns bright red. This is especially important if you are unable to feel pain, heat, or cold. You may have a greater risk of getting burned.  If directed, put ice on the affected joint: ? If you have a removable assistive device, remove it as told by your health care provider. ? Put ice in a plastic bag. ? Place a towel between your skin and the bag. If your health care provider tells you to keep the assistive device on during icing, place a towel between the assistive device and the bag. ? Leave the ice on for 20 minutes, 2-3 times a day. General instructions  Take over-the-counter and prescription medicines only as told by your health care provider.  Maintain a healthy weight. Follow instructions from your health care provider for weight control. These may include dietary restrictions.  Do not use any products that contain nicotine or tobacco, such as cigarettes and e-cigarettes. These can delay bone healing. If you need help quitting, ask your health care provider.  Use assistive devices as directed by your health care provider.  Keep all follow-up visits as told by your health care provider. This is important. Where to find more information  [employer] of Arthritis and Musculoskeletal and Skin Diseases:  www.niams.SouthExposed.es  [employer] on Aging: http://kim-miller.com/  American College of Rheumatology: www.rheumatology.org Contact a health care provider if:  Your skin turns red.  You develop a rash.  You have pain that gets worse.  You have a fever along with joint or muscle aches. Get help right away if:  You lose a lot of weight.  You suddenly lose your appetite.  You have night sweats. Summary  Osteoarthritis is a type of arthritis that affects tissue covering the ends of bones in joints (cartilage).  This condition is caused by age-related wearing down of cartilage that covers the ends of bones.  The main symptom of this condition is pain, swelling, and stiffness in the joint.  There is no cure for this condition, but treatment can help to control pain and improve joint function. This information is not intended to replace advice given to you by your health care provider. Make sure you discuss any questions you have with your health care provider. Document Revised: 12/24/2016 Document Reviewed: 09/15/2015 Elsevier Patient Education  2020 [employer].

## 2019-10-18 IMAGING — US BILATERAL CAROTID DUPLEX ULTRASOUND
1 series · 13 of 24 positions shown · non-contrast
Comparison: None.

CLINICAL DATA: Dizziness and numbness.

EXAM:
BILATERAL CAROTID DUPLEX ULTRASOUND
TECHNIQUE: Gray scale imaging, color Doppler and duplex ultrasound were
performed of bilateral carotid and vertebral arteries in the neck.

[Series 1: bilateral carotid duplex ultrasound · 13 of 66 slices shown]
[im 1/66]
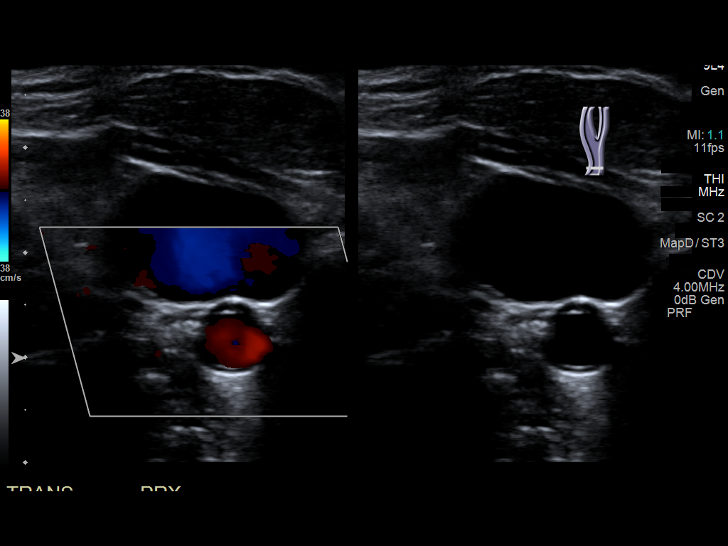
[im 6/66]
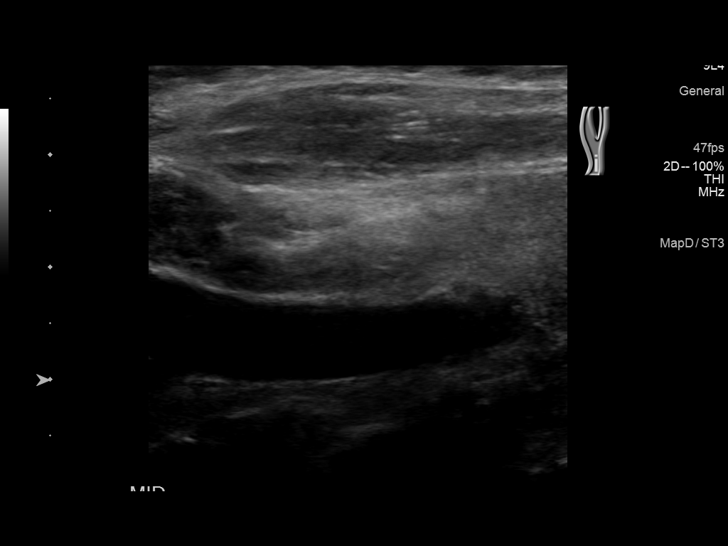
[im 12/66]
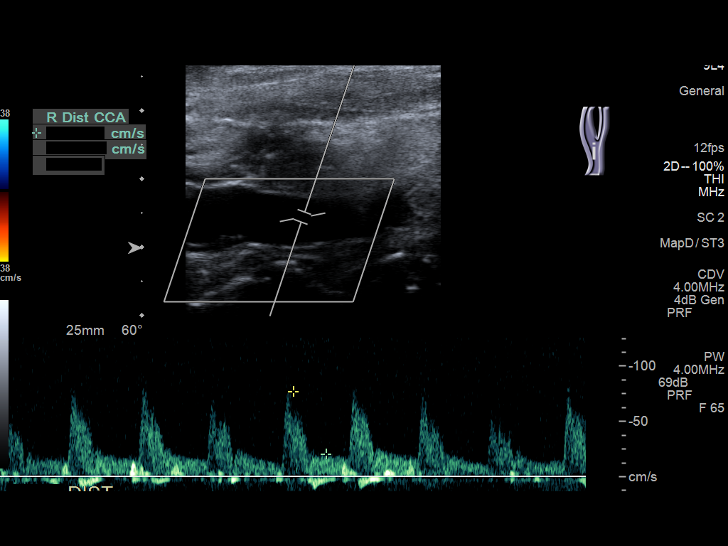
[im 17/66]
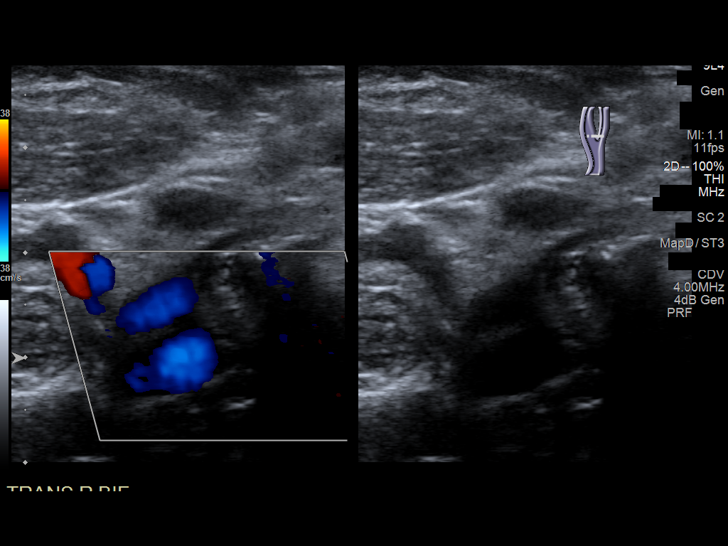
[im 23/66]
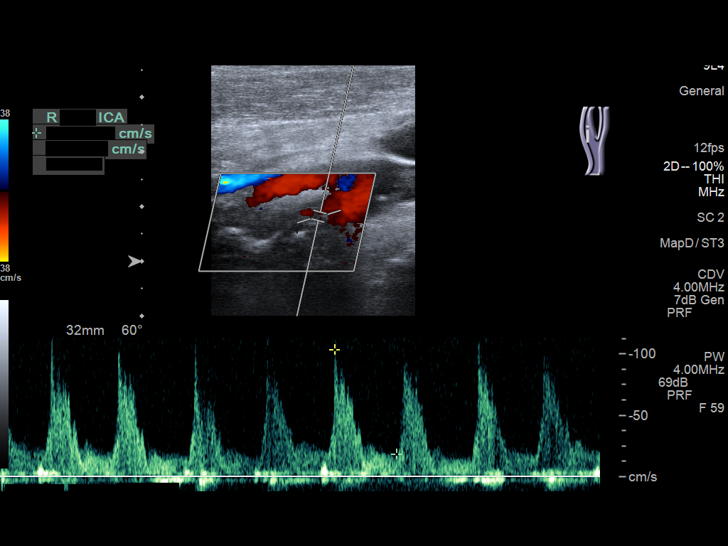
[im 29/66]
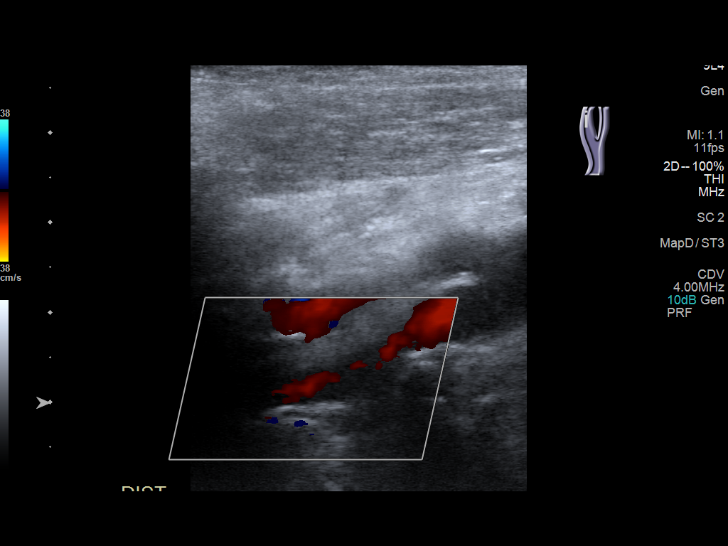
[im 34/66]
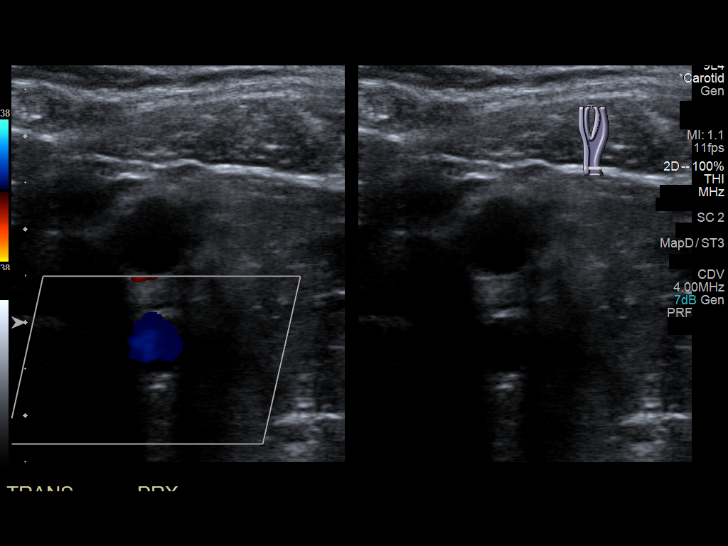
[im 37/66]
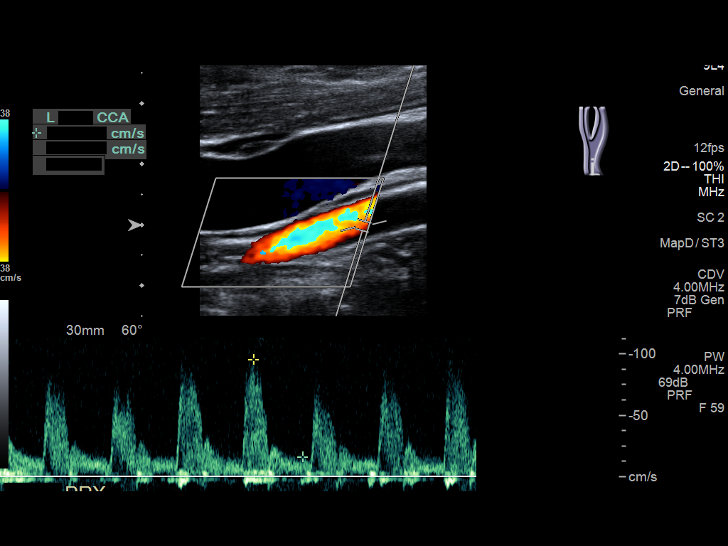
[im 43/66]
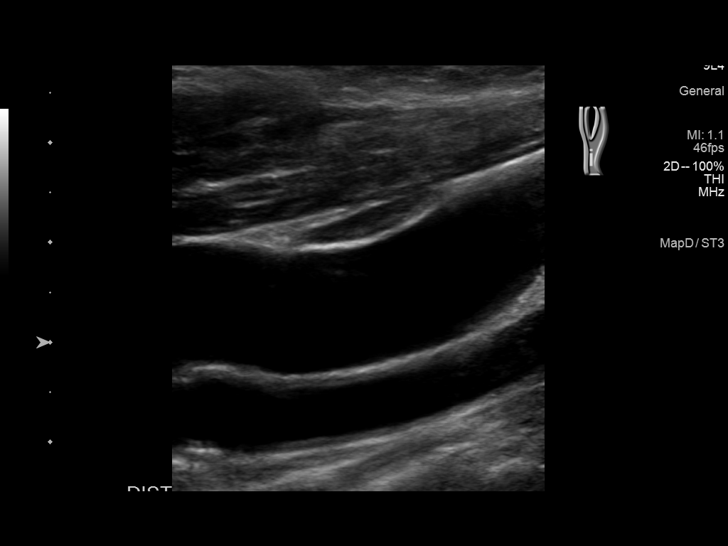
[im 49/66]
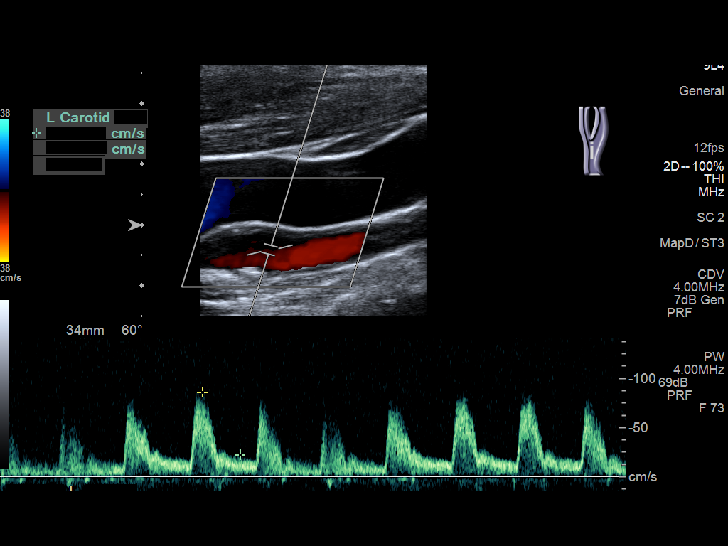
[im 54/66]
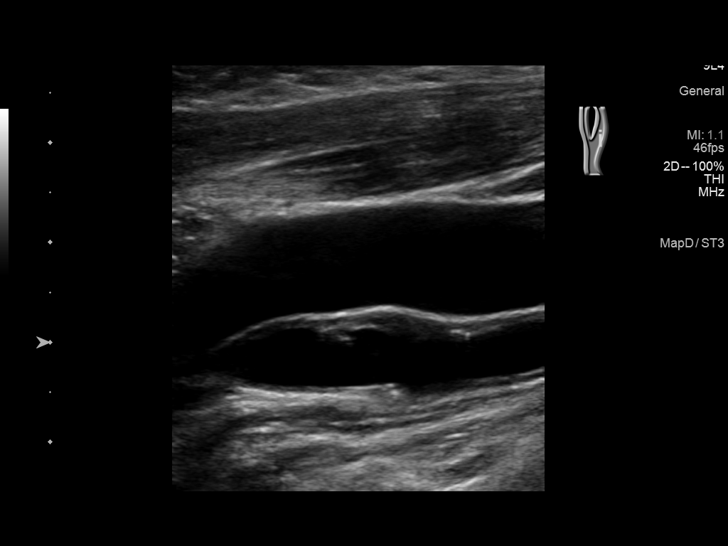
[im 60/66]
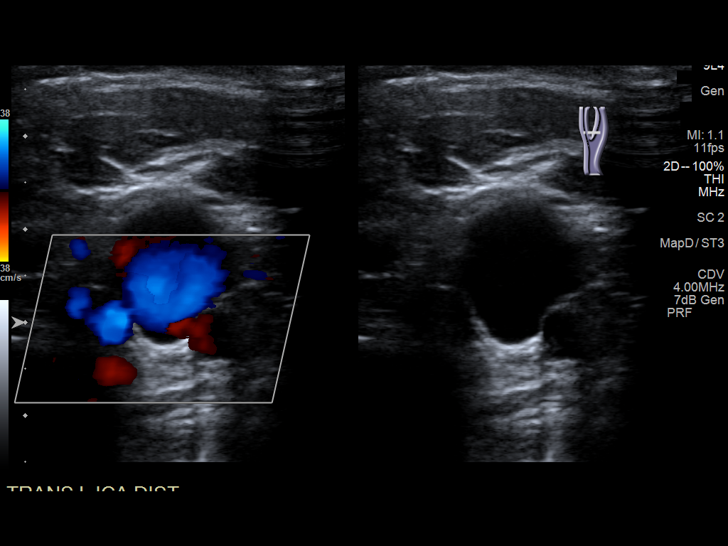
[im 66/66]
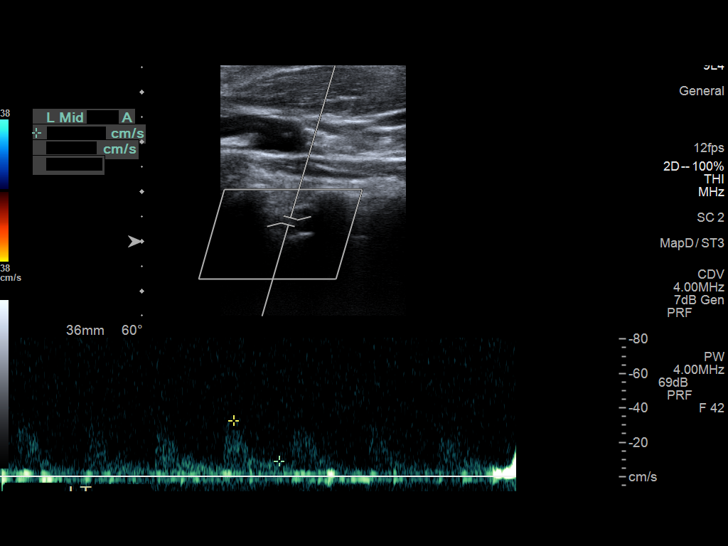

[13 of 24 positions shown; findings below may reference images not displayed]

FINDINGS: Criteria: Quantification of carotid stenosis is based on velocity
parameters that correlate the residual internal carotid diameter
with NASCET-based stenosis levels, using the diameter of the distal
internal carotid lumen as the denominator for stenosis measurement.

The following velocity measurements were obtained:

RIGHT

ICA: 103/25 cm/sec

CCA: 90/17 cm/sec

SYSTOLIC ICA/CCA RATIO:

ECA: 105 cm/sec

LEFT

ICA: 87/32 cm/sec

CCA: 113/27 cm/sec

SYSTOLIC ICA/CCA RATIO:

ECA: 99 cm/sec

RIGHT CAROTID ARTERY: Echogenic plaque at the right carotid bulb.
External carotid artery is patent with normal waveform. Echogenic
plaque in the internal carotid artery without significant stenosis.
Normal waveforms and velocities in the internal carotid artery.

RIGHT VERTEBRAL ARTERY: Antegrade flow and normal waveform in the
right vertebral artery.

LEFT CAROTID ARTERY: Small amount of plaque at the left carotid
bulb. External carotid artery is patent with normal waveform. Small
amount of plaque and intimal thickening in the proximal internal
carotid artery. Normal waveforms and velocities in the internal
carotid artery.

LEFT VERTEBRAL ARTERY: Antegrade flow and normal waveform in the
left vertebral artery.
IMPRESSION: Mild atherosclerotic disease in the bilateral carotid arteries.
Estimated degree of stenosis in the internal carotid arteries is
less than 50% bilaterally.

Patent vertebral arteries with antegrade flow.

## 2019-10-18 IMAGING — CT CT ANGIOGRAPHY HEAD
2 of 7 series · 8 of 33 positions shown · IV contrast (APPLIED)
Comparison: Head CT from earlier today

CLINICAL DATA: Recurrent syncope

EXAM:
CT ANGIOGRAPHY HEAD AND NECK
TECHNIQUE: Multidetector CT imaging of the head and neck was performed using
the standard protocol during bolus administration of intravenous
contrast. Multiplanar CT image reconstructions and MIPs were
obtained to evaluate the vascular anatomy. Carotid stenosis
measurements (when applicable) are obtained utilizing NASCET
criteria, using the distal internal carotid diameter as the
denominator.
CONTRAST:  75mL OMNIPAQUE IOHEXOL 350 MG/ML SOLN

[Series 4: cta head neck · axial · 0.58mm/px · z∈[-243,-131]mm · 2 of 168 slices shown]
[im 56/168  soft-tissue]
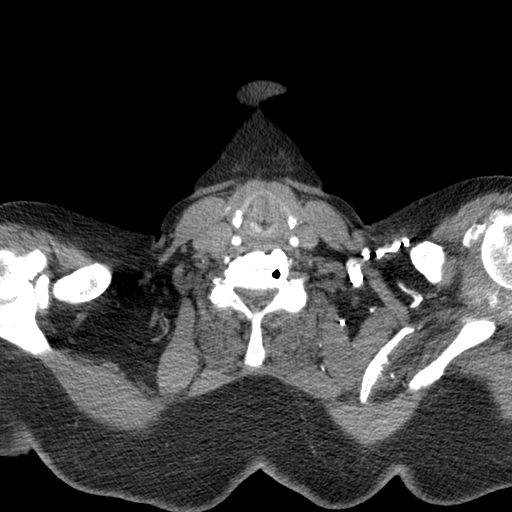
[im 112/168  soft-tissue]
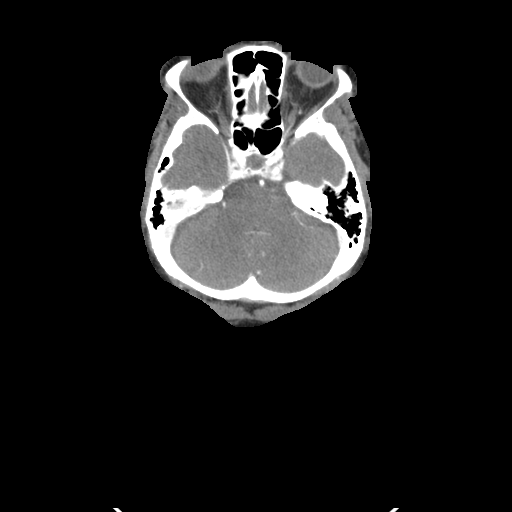

[Series 6: ax thin · axial · 0.51mm/px · z∈[-310,-72]mm · 6 of 334 slices shown]
[im 48/334  soft-tissue]
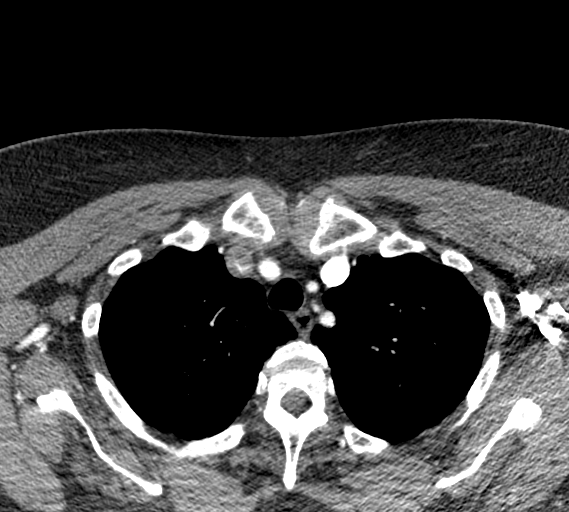
[im 96/334  bone]
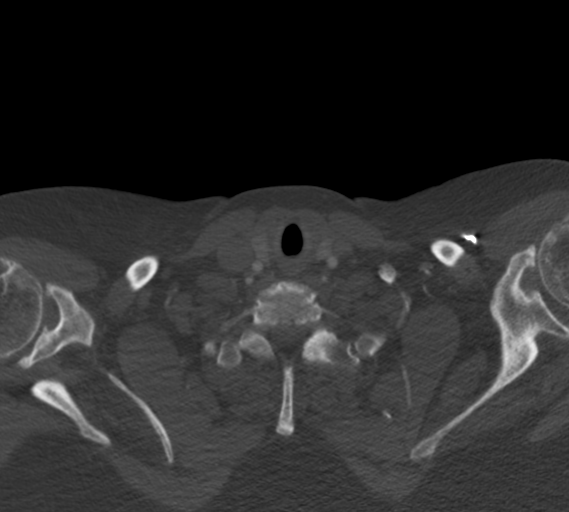
[im 143/334  soft-tissue]
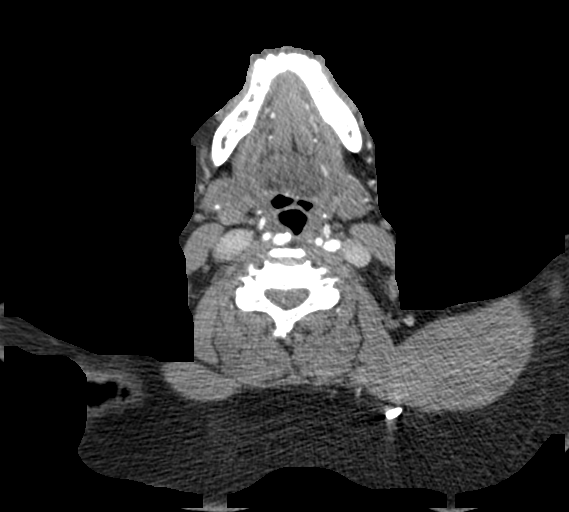
[im 191/334  bone]
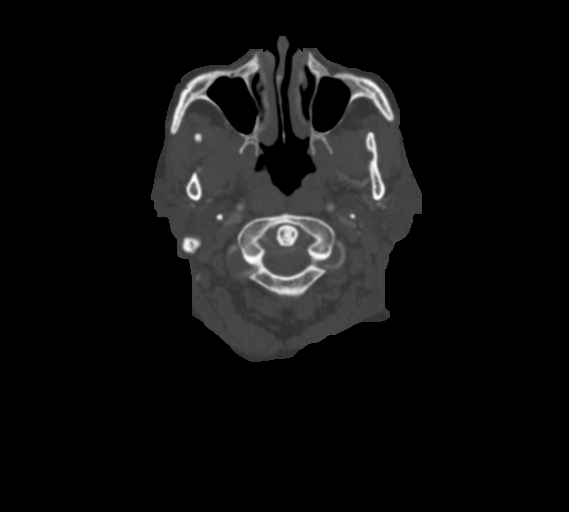
[im 238/334  soft-tissue]
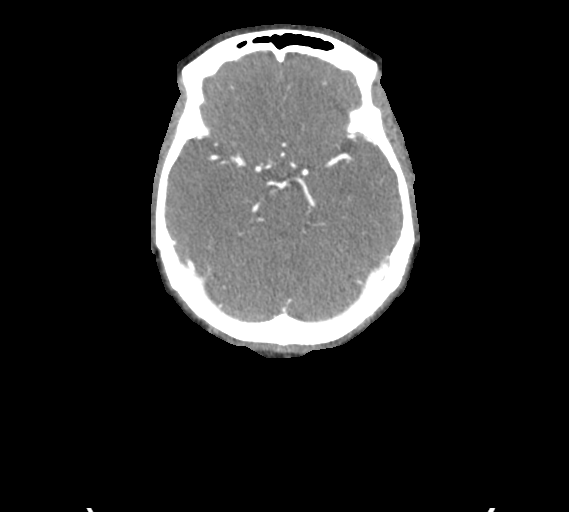
[im 286/334  bone]
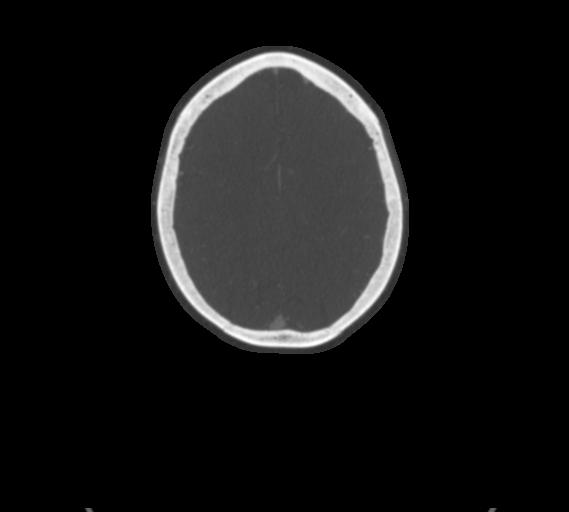

[8 of 33 positions shown; findings below may reference images not displayed]

FINDINGS: CTA NECK FINDINGS

Aortic arch: Mild atherosclerotic calcification. Three vessel
branching.

Right carotid system: Calcified plaque at the ICA bulb with 25%
narrowing based on coronal reformats. No ulceration or beading

Left carotid system: Mild calcified plaque at the ICA bulb without
flow limiting stenosis or ulceration.

Vertebral arteries: No proximal subclavian stenosis. Fairly early
branching of the left vertebral artery. Dominant right vertebral
artery. Both vertebral arteries are smooth and widely patent to the
dura.

Skeleton: Disc and facet degeneration with C3-4 anterolisthesis. No
acute or aggressive finding.

Other neck: Partial retropharyngeal course of the right ICA.

Upper chest: Negative

Review of the MIP images confirms the above findings

CTA HEAD FINDINGS

Anterior circulation: Atherosclerotic plaque along both carotid
siphons. No branch occlusion or flow limiting stenosis. Negative for
aneurysm.

Posterior circulation: Right dominant vertebral artery. Vertebral
and basilar arteries are smooth and widely patent. No branch
occlusion, beading, or aneurysm.

Venous sinuses: Patent

Anatomic variants: Incomplete circle-of-Willis with no visible
anterior left posterior communicating arteries.

Review of the MIP images confirms the above findings
IMPRESSION: 1. No emergent finding or evidence of vertebrobasilar insufficiency.
2. Carotid atherosclerosis without flow limiting stenosis.

## 2019-10-18 IMAGING — MR MRI LUMBAR SPINE WITHOUT AND WITH CONTRAST
6 of 7 series · 31 of 48 positions shown · IV contrast (10ml Gadavist)
Comparison: None.

CLINICAL DATA: Bilateral foot paresthesias and numbness.

EXAM:
MRI LUMBAR SPINE WITHOUT AND WITH CONTRAST
TECHNIQUE: Multiplanar and multiecho pulse sequences of the lumbar spine were
obtained without and with intravenous contrast.
CONTRAST:  10 mL Gadavist

[Series 10: T2 · sagittal · 4.0mm · 0.81mm/px · 5 of 17 slices shown (1 of 2)]
[im 1/17]
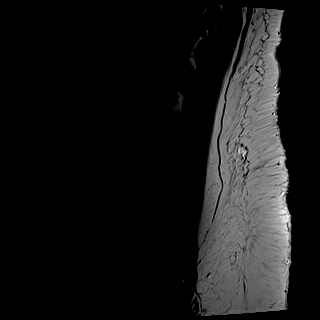
[im 5/17]
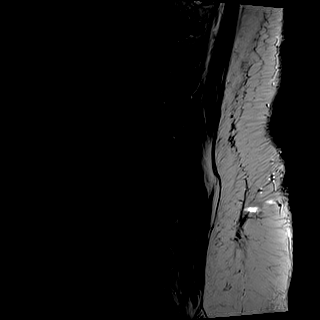
[im 9/17]
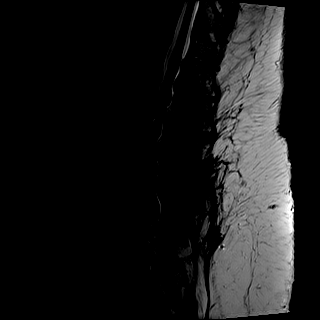
[im 13/17]
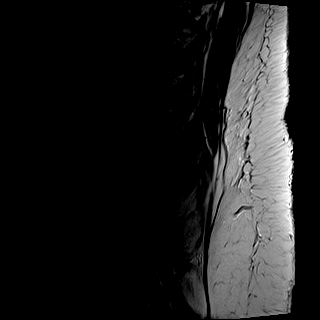
[im 17/17]
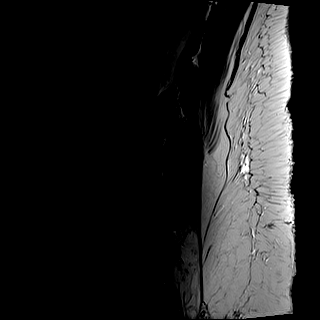

[Series 11: STIR · sagittal · 4.0mm · 0.51mm/px · 2 of 17 slices shown]
[im 1/17]
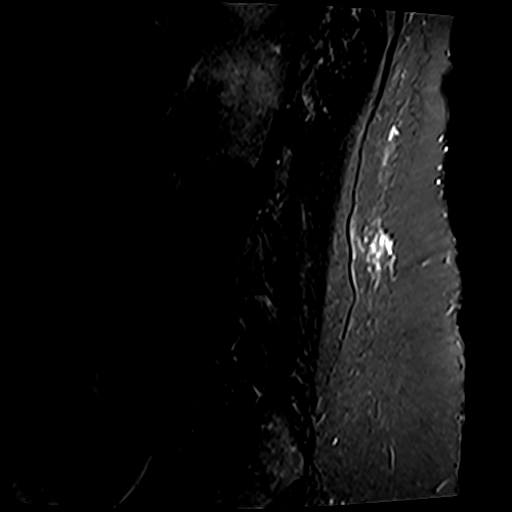
[im 5/17]
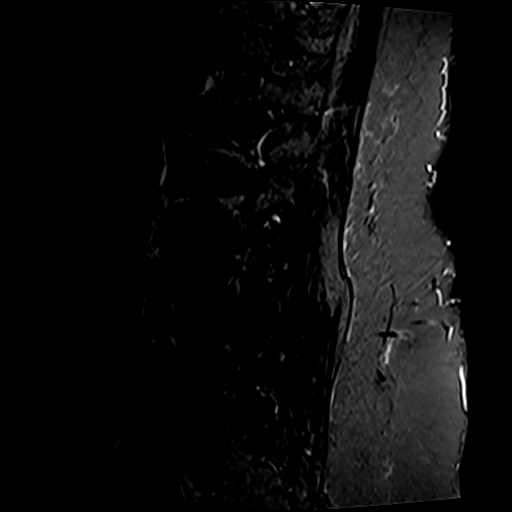

[Series 12: T1 · sagittal · 4.0mm · 0.81mm/px · 4 of 17 slices shown (1 of 2)]
[im 1/17]
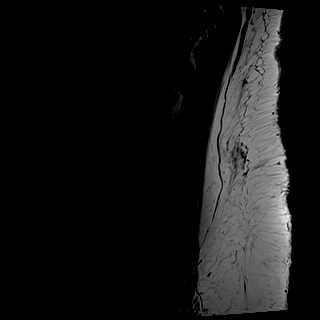
[im 6/17]
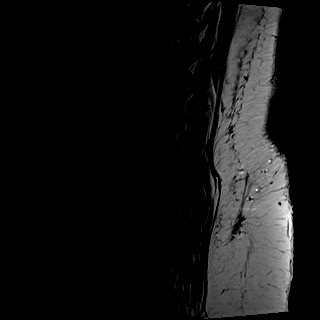
[im 11/17]
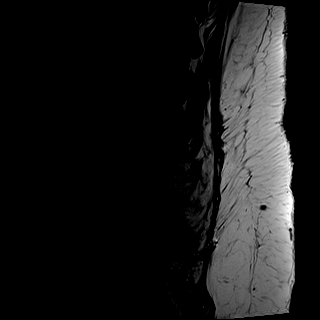
[im 17/17]
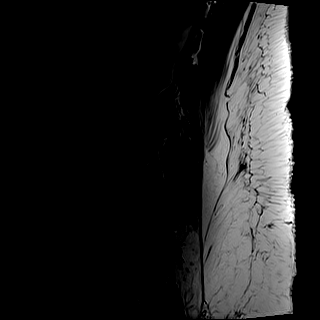

[Series 13: T2 · axial · 4.0mm · 0.78mm/px · z∈[-173,+44]mm · 8 of 38 slices shown (2 of 2)]
[im 1/38]
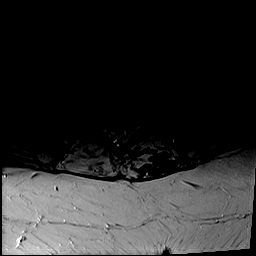
[im 5/38]
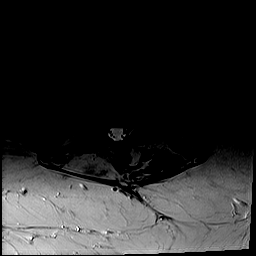
[im 13/38]
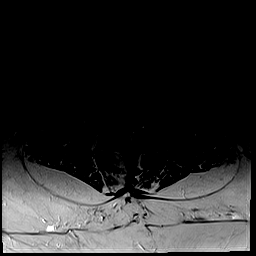
[im 17/38]
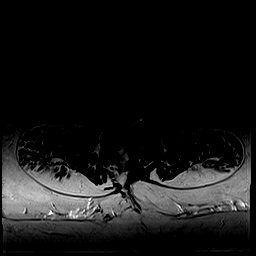
[im 21/38]
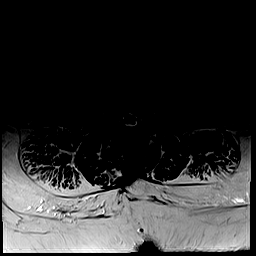
[im 25/38]
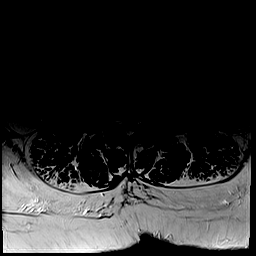
[im 33/38]
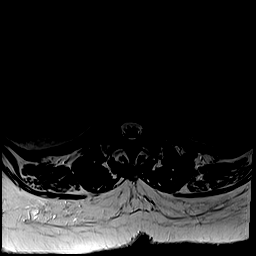
[im 38/38]
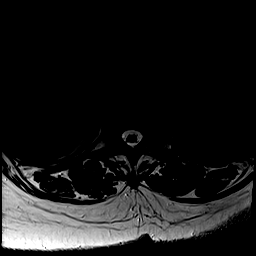

[Series 14: T1 · axial · 4.0mm · 0.39mm/px · z∈[-173,+44]mm · 8 of 38 slices shown (2 of 2)]
[im 1/38]
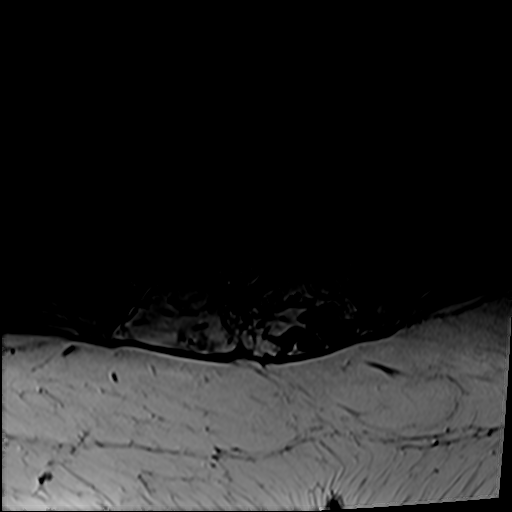
[im 5/38]
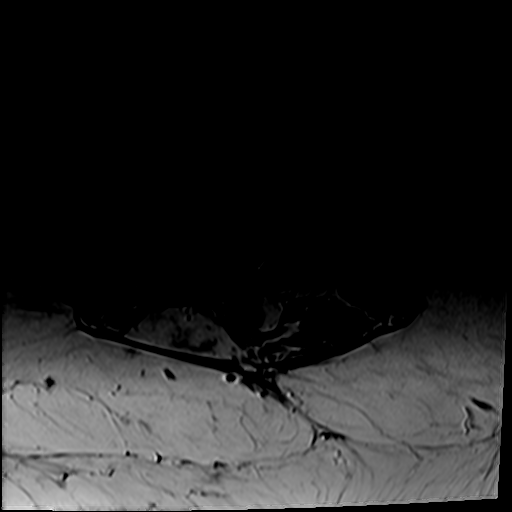
[im 13/38]
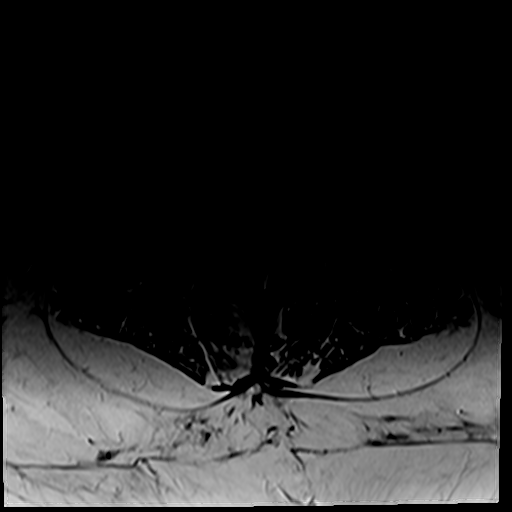
[im 17/38]
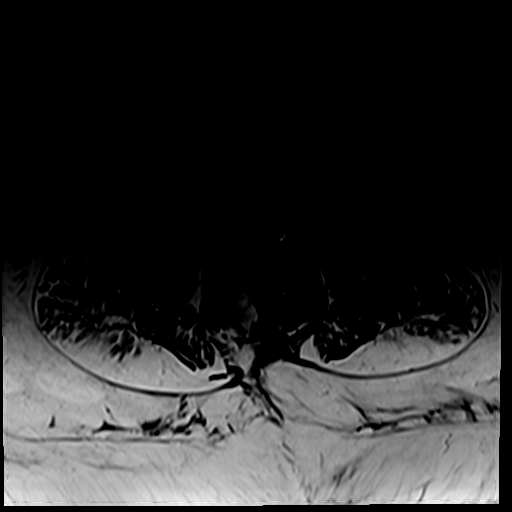
[im 21/38]
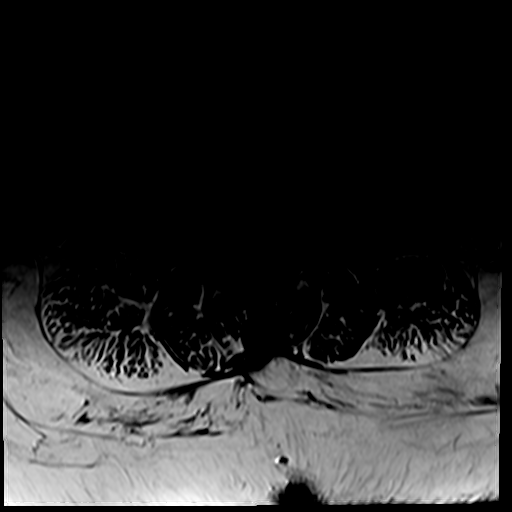
[im 25/38]
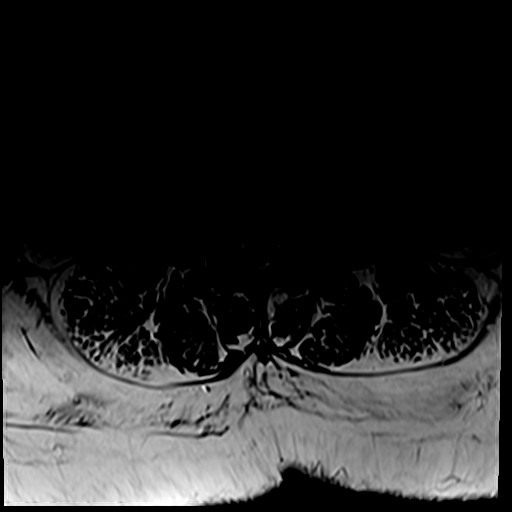
[im 33/38]
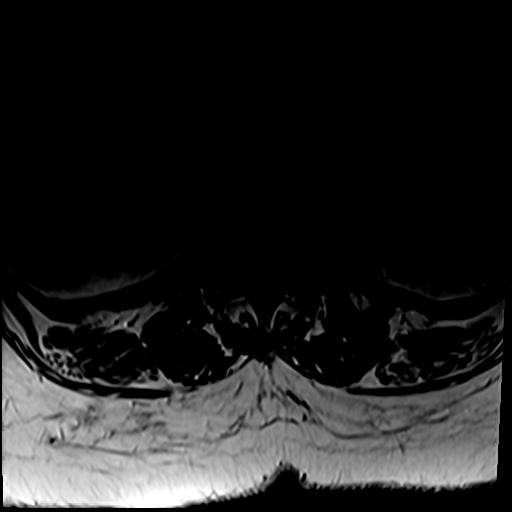
[im 38/38]
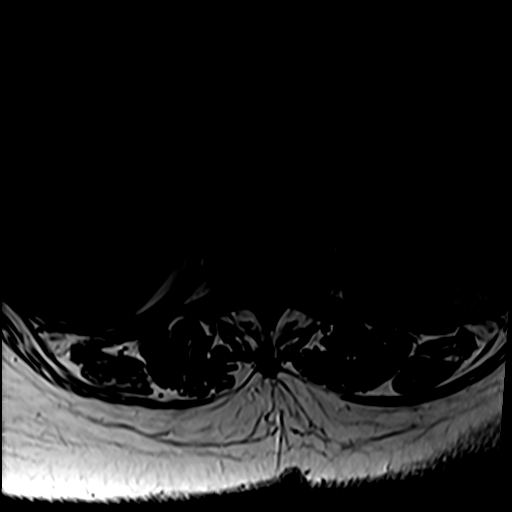

[Series 15: T1 fat-sat post-contrast · sagittal · 4.0mm · 0.81mm/px · 4 of 17 slices shown]
[im 1/17]
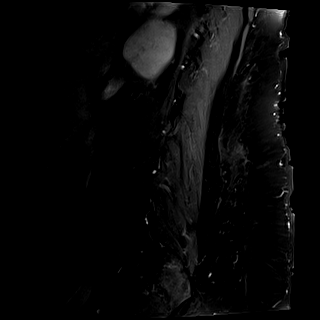
[im 6/17]
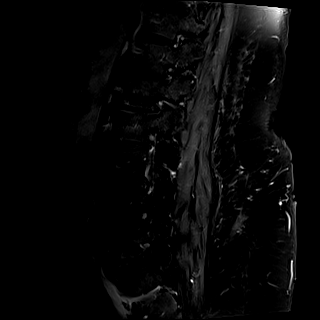
[im 11/17]
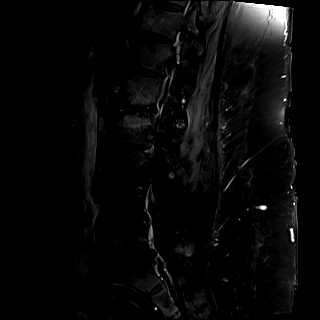
[im 17/17]
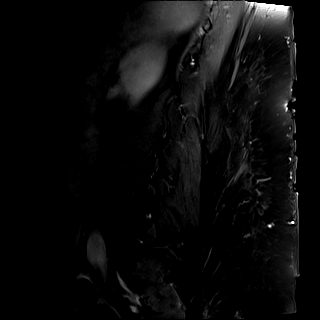

[31 of 48 positions shown; findings below may reference images not displayed]

FINDINGS: Segmentation:  Normal

Alignment:  Normal

Vertebrae:  No fracture, evidence of discitis, or bone lesion.

Conus medullaris and cauda equina: Conus extends to the L1 level.
Conus and cauda equina appear normal.

Paraspinal and other soft tissues: Negative

Disc levels:

T11-12: Normal.

T12-L1: Mild disc bulge without spinal canal or neural foraminal
stenosis.

L1-2: Disc space narrowing with moderate spinal canal stenosis.
There is moderate right and severe left neural foraminal stenosis.

L2-3: Intermediate disc bulge with severe spinal canal stenosis.
Moderate right and mild left neural foraminal stenosis.

L3-4: Intermediate disc bulge with mild spinal canal stenosis.
Moderate right and mild left neural foraminal stenosis. Mild facet
hypertrophy.

L4-5: Remote right hemilaminectomy. Moderate bilateral facet
hypertrophy. Mild disc bulge with endplate spurring. No central
spinal canal stenosis. Mild right and moderate left neural foraminal
stenosis. No abnormal contrast enhancement.

L5-S1: Remote right laminectomy. No spinal canal stenosis.
Intermediate disc bulge with central annular fissure. Moderate right
and severe left neural foraminal stenosis. No abnormal contrast
enhancement.
IMPRESSION: 1. Multilevel lumbar degenerative disc disease with severe L2-3 and
moderate L1-2 spinal canal stenosis.
2. Postsurgical changes at L4-5 and L5-S1 without residual spinal
canal stenosis but there is moderate-to-severe neural foraminal
stenosis at both levels.
3. Moderate-to-severe neural foraminal stenosis at bilateral L1-2,
right L2-3, right L3-4, left L4-5 and bilateral L5-S1.

## 2019-10-25 ENCOUNTER — Ambulatory Visit (INDEPENDENT_AMBULATORY_CARE_PROVIDER_SITE_OTHER): Payer: PPO | Admitting: Cardiology

## 2019-10-25 ENCOUNTER — Other Ambulatory Visit: Payer: Self-pay

## 2019-10-25 ENCOUNTER — Encounter: Payer: Self-pay | Admitting: Cardiology

## 2019-10-25 VITALS — BP 102/62 | HR 82 | Ht 65.0 in | Wt 285.0 lb

## 2019-10-25 DIAGNOSIS — I480 Paroxysmal atrial fibrillation: Secondary | ICD-10-CM

## 2019-10-25 DIAGNOSIS — I10 Essential (primary) hypertension: Secondary | ICD-10-CM

## 2019-10-25 NOTE — Patient Instructions (Signed)
Medication Instructions:  Your physician recommends that you continue on your current medications as directed. Please refer to the Current Medication list given to you today.  *If you need a refill on your cardiac medications before your next appointment, please call your pharmacy*  Follow-Up: At [facility], you and your health needs are our priority.  As part of our continuing mission to provide you with exceptional heart care, we have created designated Provider Care Teams.  These Care Teams include your primary Cardiologist (physician) and Advanced Practice Providers (APPs -  Physician Assistants and Nurse Practitioners) who all work together to provide you with the care you need, when you need it.  We recommend signing up for the patient portal called "MyChart".  Sign up information is provided on this After Visit Summary.  MyChart is used to connect with patients for Virtual Visits (Telemedicine).  Patients are able to view lab/test results, encounter notes, upcoming appointments, etc.  Non-urgent messages can be sent to your provider as well.   To learn more about what you can do with MyChart, go to [email or web address].    Your next appointment:   6 month(s)  The format for your next appointment:   In Person  Provider:   You may see Kate Sable, MD or one of the following Advanced Practice Providers on your designated Care Team:    Murray Hodgkins, NP  Christell Faith, PA-C  Marrianne Mood, PA-C  Cadence Fairdale, [place]

## 2019-10-25 NOTE — Progress Notes (Signed)
Cardiology Office Note:    Date:  10/25/2019   ID:  MARSHA GUNDLACH, DOB [birth year], MRN [id number]  PCP:  Delsa Grana, PA-C  Cardiologist:  Kate Sable, MD  Electrophysiologist:  None   Referring MD: Delsa Grana, PA-C   No chief complaint on file.   History of Present Illness:    Colleen [name] is a 74 y.o. female with a hx of hypertension, hyperlipidemia, COPD, PE, paroxysmal atrial fibrillation on Eliquis, former smoker who presents for follow-up.  Patient is being seen due to A. fib and also hypertension.  She was started on Toprol-XL but stopped taking because she states Toprol caused worsening of her reflux and caused edema.  The symptoms have since improved since stopping Toprol-XL.  Denies palpitations.  Due to severe reflux, EGD and colonoscopy was being planned but canceled secondary to wait times from COVID-19 pandemic.  Currently takes enalapril 5 mg daily, HCTZ 12.5 mg daily.  Her blood pressure is well controlled     Prior notes Patient was admitted at [facility] due to palpitations.  Chest CTA showed PE.  While in the hospital, telemetry monitor did note paroxysmal atrial fibrillation.  Eliquis was started for PE protocol.  Referral to hematology also placed.  Echocardiogram showed normal systolic and diastolic function, Lexiscan Myoview 05/2019 with no evidence of ischemia.  Dyspnea on exertion was contributed to COPD.   Past Medical History:  Diagnosis Date  . Asthma   . Cataract   . COPD (chronic obstructive pulmonary disease) ([place])   . GERD (gastroesophageal reflux disease)   . Gout   . History of pulmonary embolus (PE)   . Hyperlipidemia   . Hypertension   . Osteoarthrosis   . Prediabetes     Past Surgical History:  Procedure Laterality Date  . BREAST EXCISIONAL BIOPSY Left 04/08/2014   Intraductal papilloma-5 mm  . LUMBAR LAMINECTOMY    . TONSILLECTOMY      Current Medications: Current Meds  Medication Sig  . allopurinol (ZYLOPRIM) 100 MG  tablet Take 2 tablets (200 mg total) by mouth daily.  Marland Kitchen aspirin EC 81 MG tablet Take 81 mg by mouth daily.  . Blood Pressure Monitoring (BLOOD PRESSURE KIT) DEVI 1 Device by Does not apply route daily. Fluctuating blood pressure; please get appropriate size, large cuff  . cholecalciferol (VITAMIN D3) 25 MCG (1000 UNIT) tablet Take 1,000 Units by mouth daily.  Marland Kitchen ELIQUIS 5 MG TABS tablet Take 5 mg by mouth 2 (two) times daily.  . enalapril (VASOTEC) 5 MG tablet Take 5 mg by mouth daily.   . famotidine (PEPCID) 20 MG tablet Take 20 mg by mouth daily as needed for heartburn or indigestion.  . ferrous sulfate 325 (65 FE) MG EC tablet Take 1 tablet (325 mg total) by mouth daily.  . hydrochlorothiazide (MICROZIDE) 12.5 MG capsule Take 1 capsule by mouth once daily  . lovastatin (MEVACOR) 40 MG tablet Take 1 tablet (40 mg total) by mouth at bedtime.  Marland Kitchen PROAIR HFA 108 (90 Base) MCG/ACT inhaler INHALE 2 PUFFS INTO LUNGS EVERY 4 HOURS AS NEEDED FOR WHEEZING FOR SHORTNESS OF BREATH  . simethicone (GAS-X) 80 MG chewable tablet Chew 1 tablet (80 mg total) by mouth 2 (two) times a day.  . SYMBICORT 160-4.5 MCG/ACT inhaler Inhale 2 puffs into the lungs 2 (two) times daily.  Marland Kitchen triamcinolone cream (KENALOG) 0.1 % APPLY CREAM EXTERNALLY TO AFFECTED AREA TWICE DAILY if needed; not more than one week at a time,  then take a break for two weeks     Allergies:   Patient has no known allergies.   Social History   Socioeconomic History  . Marital status: Widowed    Spouse name: Titus Dubin  . Number of children: 0  . Years of education: Not on file  . Highest education level: 12th grade  Occupational History  . Occupation: Retired  Tobacco Use  . Smoking status: Former Smoker    Packs/day: 1.50    Years: 20.00    Pack years: 30.00    Types: Cigarettes    Quit date: 11/13/2008    Years since quitting: 10.9  . Smokeless tobacco: Never Used  . Tobacco comment: smoking cessation materials not required  Vaping  Use  . Vaping Use: Never used  Substance and Sexual Activity  . Alcohol use: Not Currently    Alcohol/week: 1.0 standard drink    Types: 1 Cans of beer per week  . Drug use: No  . Sexual activity: Not Currently  Other Topics Concern  . Not on file  Social History Narrative  . Not on file   Social Determinants of Health   Financial Resource Strain:   . Difficulty of Paying Living Expenses: Not on file  Food Insecurity:   . Worried About Charity fundraiser in the Last Year: Not on file  . Ran Out of Food in the Last Year: Not on file  Transportation Needs:   . Lack of Transportation (Medical): Not on file  . Lack of Transportation (Non-Medical): Not on file  Physical Activity:   . Days of Exercise per Week: Not on file  . Minutes of Exercise per Session: Not on file  Stress:   . Feeling of Stress : Not on file  Social Connections:   . Frequency of Communication with Friends and Family: Not on file  . Frequency of Social Gatherings with Friends and Family: Not on file  . Attends Religious Services: Not on file  . Active Member of Clubs or Organizations: Not on file  . Attends Archivist Meetings: Not on file  . Marital Status: Not on file     Family History: The patient's family history includes Congestive Heart Failure in her sister; Diabetes in her mother and sister; Glaucoma in her sister; Lung cancer in her father. There is no history of Breast cancer.  ROS:   Please see the history of present illness.     All other systems reviewed and are negative.  EKGs/Labs/Other Studies Reviewed:    The following studies were reviewed today:   EKG:  EKG is  ordered today.  The ekg ordered today demonstrates sinus rhythm  Recent Labs: 03/06/2019: Hemoglobin 9.2; Platelets 488; TSH 1.62 06/12/2019: ALT 10; BUN 22; Creat 0.98; Potassium 3.8; Sodium 139  Recent Lipid Panel    Component Value Date/Time   CHOL 150 03/06/2019 0928   CHOL 161 07/30/2014 0830   TRIG 68  03/06/2019 0928   HDL 72 03/06/2019 0928   HDL 82 07/30/2014 0830   CHOLHDL 2.1 03/06/2019 0928   VLDL 15 12/31/2015 0922   LDLCALC 64 03/06/2019 0928    Physical Exam:    VS:  BP 102/62   Pulse 82   Ht '5\' 5"'  (1.651 m)   Wt 285 lb (129.3 kg)   SpO2 99%   BMI 47.43 kg/m     Wt Readings from Last 3 Encounters:  10/25/19 285 lb (129.3 kg)  10/16/19 284 lb 8 oz (  129 kg)  07/24/19 (!) 308 lb 6 oz (139.9 kg)     GEN:  Well nourished, well developed in no acute distress, obese HEENT: Normal NECK: No JVD; No carotid bruits LYMPHATICS: No lymphadenopathy CARDIAC: Regular, frequent skipped beats, no murmurs, rubs, gallops RESPIRATORY:  Clear to auscultation without rales, wheezing or rhonchi  ABDOMEN: Soft, non-tender, non-distended MUSCULOSKELETAL:  trace edema; No deformity  SKIN: Warm and dry NEUROLOGIC:  Alert and oriented x 3 PSYCHIATRIC:  Normal affect   ASSESSMENT:    1. Essential hypertension   2. Paroxysmal atrial fibrillation (HCC)   3. Morbid obesity ([place])    PLAN:    In order of problems listed above:  1. Blood pressure adequately controlled.  Continue HCTZ 12.5 mg daily, enalapril 5 mg daily. Patient advised to check blood pressure daily.  Low-salt diet advised. 2. Patient with history of paroxysmal atrial fibrillation, currently in sinus rhythm, denies palpitations.  Cannot tolerate Toprol-XL, heart rate controlled continue Eliquis 5 mg twice daily.  Monitor off rate control agents for now.  If palpitations are to recur, will start Cardizem. 3. Patient with morbid obesity, weight loss, exercise advised.  Follow-up in 6 months  Total encounter time 40 minutes  Greater than 50% was spent in counseling and coordination of care with the patient Time spent educating patient on COVID-19 vaccine.  She was educated and encouraged to obtain vaccine/booster shot when available.  This note was generated in part or whole with voice recognition software. Voice  recognition is usually quite accurate but there are transcription errors that can and very often do occur. I apologize for any typographical errors that were not detected and corrected.  Medication Adjustments/Labs and Tests Ordered: Current medicines are reviewed at length with the patient today.  Concerns regarding medicines are outlined above.  Orders Placed This Encounter  Procedures  . EKG 12-Lead   No orders of the defined types were placed in this encounter.   Patient Instructions  Medication Instructions:  Your physician recommends that you continue on your current medications as directed. Please refer to the Current Medication list given to you today.  *If you need a refill on your cardiac medications before your next appointment, please call your pharmacy*  Follow-Up: At [facility], you and your health needs are our priority.  As part of our continuing mission to provide you with exceptional heart care, we have created designated Provider Care Teams.  These Care Teams include your primary Cardiologist (physician) and Advanced Practice Providers (APPs -  Physician Assistants and Nurse Practitioners) who all work together to provide you with the care you need, when you need it.  We recommend signing up for the patient portal called "MyChart".  Sign up information is provided on this After Visit Summary.  MyChart is used to connect with patients for Virtual Visits (Telemedicine).  Patients are able to view lab/test results, encounter notes, upcoming appointments, etc.  Non-urgent messages can be sent to your provider as well.   To learn more about what you can do with MyChart, go to [email or web address].    Your next appointment:   6 month(s)  The format for your next appointment:   In Person  Provider:   You may see Kate Sable, MD or one of the following Advanced Practice Providers on your designated Care Team:    Murray Hodgkins, NP  Christell Faith,  PA-C  Marrianne Mood, PA-C  Cadence Susan Moore, [place]     Signed, Kate Sable, MD  10/25/2019  10:53 AM    [facility]

## 2019-11-12 ENCOUNTER — Telehealth: Payer: Self-pay

## 2019-11-12 NOTE — Telephone Encounter (Signed)
Copied from [place] [id number]. Topic: Quick Communication - See Telephone Encounter >> Nov 12, 2019  4:21 PM [place] wrote: CRM for notification. See Telephone encounter for: 11/12/19.hydrochlorothiazide (MICROZIDE) 12.5 MG capsule Medication Date: 09/24/2019 Department: [facility] Ordering/Authorizing: Delsa Grana, PA-C Order Providers  Prescribing Provider Encounter Provider Delsa Grana, PA-C Delsa Grana, PA-C Outpatient Medication Detail   Disp Refills Start End  hydrochlorothiazide (MICROZIDE) 12.5 MG capsule 90 capsule 3 09/24/2019    Pt upset due to a recall on FB   wants cb [phone]

## 2019-11-13 NOTE — Telephone Encounter (Signed)
Spoke to pharmacy. Patient notified she has refills there

## 2019-11-14 ENCOUNTER — Telehealth: Payer: Self-pay

## 2019-11-14 NOTE — Telephone Encounter (Signed)
-----   Message from [place], [place] sent at 08/27/2019  4:57 PM EDT ----- Regarding: EGD and Colonoscopy Set recall for 1 month  Message text  You routed conversation to Virgel Manifold, MD 3 days ago You 3 days ago     Dr. Bonna Gains, we finally received clearance from patient's hematologist. However, when I called patient to schedule her EGD and Colonoscopy, patient declined. Patient stated that she had several appointments coming up including an iron infusion this coming Monday and follow up appointment with her PCP. Patient's hematologist recommended for the patient to stop Eliquis 5 MG 2 days prior to procedure and restart blood thinner ASAP after procedure.

## 2019-11-14 NOTE — Telephone Encounter (Signed)
Called patient to let her know that Dr. Bonna Gains wanted to repeat her colonoscopy and she stated again that she was not interested in scheduling anything at this time. I apologized for calling her but that I will have to mention it to the provider and she stated that it was fine. Dr. Bonna Gains, I have been going back and forth for several months and have not been able to schedule her another colonoscopy. Do you want to see her or do a telephone appointment with her and discuss with her the importance of this colonoscopy? Please advise. Thank you.

## 2019-11-20 NOTE — Telephone Encounter (Signed)
Virgel Manifold, MD  You 4 hours ago (11:50 AM)   If she is agreeable to phone visit, I can do that and discuss it with her. If she refuses phone appointment or procedures, that is up to her     Called patient and was not able to leave her a voicemail. I will send her a letter to call us back.

## 2019-11-26 NOTE — Telephone Encounter (Signed)
Patient called back after receiving my letter and she stated that she will see her hematologist on 12/12/2019 and that she was going to ask her doctor if it was still necessary for her to have the EGD and colonoscopy done. Patient stated that if she needed to have it done, she would call us to let us know and would schedule them at the end of winter since she has "bad knees" and is afraid to go out due to bad weather.

## 2019-11-29 ENCOUNTER — Ambulatory Visit (INDEPENDENT_AMBULATORY_CARE_PROVIDER_SITE_OTHER): Payer: PPO

## 2019-11-29 DIAGNOSIS — Z0000 Encounter for general adult medical examination without abnormal findings: Secondary | ICD-10-CM | POA: Diagnosis not present

## 2019-11-29 NOTE — Patient Instructions (Signed)
Colleen [name] , Thank you for taking time to come for your Medicare Wellness Visit. I appreciate your ongoing commitment to your health goals. Please review the following plan we discussed and let me know if I can assist you in the future.   Screening recommendations/referrals: Colonoscopy: planning for screening colonoscopy in 2022 Mammogram: done 05/03/19 Bone Density: done 05/18/17 Recommended yearly ophthalmology/optometry visit for glaucoma screening and checkup Recommended yearly dental visit for hygiene and checkup  Vaccinations: Influenza vaccine: done 10/16/19 Pneumococcal vaccine: done 02/21/13 Tdap vaccine: done 10/13/11 Shingles vaccine: Shingrix discussed. Please contact your pharmacy for coverage information.  Covid-19: done 02/01/19, 02/22/19 & 11/21/19  Advanced directives: Please bring a copy of your health care power of attorney and living will to the office at your convenience.  Conditions/risks identified: Recommend continuing to prevent falls  Next appointment: Follow up in one year for your annual wellness visit    Preventive Care 65 Years and Older, Female Preventive care refers to lifestyle choices and visits with your health care provider that can promote health and wellness. What does preventive care include?  A yearly physical exam. This is also called an annual well check.  Dental exams once or twice a year.  Routine eye exams. Ask your health care provider how often you should have your eyes checked.  Personal lifestyle choices, including:  Daily care of your teeth and gums.  Regular physical activity.  Eating a healthy diet.  Avoiding tobacco and drug use.  Limiting alcohol use.  Practicing safe sex.  Taking low-dose aspirin every day.  Taking vitamin and mineral supplements as recommended by your health care provider. What happens during an annual well check? The services and screenings done by your health care provider during your annual well  check will depend on your age, overall health, lifestyle risk factors, and family history of disease. Counseling  Your health care provider may ask you questions about your:  Alcohol use.  Tobacco use.  Drug use.  Emotional well-being.  Home and relationship well-being.  Sexual activity.  Eating habits.  History of falls.  Memory and ability to understand (cognition).  Work and work Statistician.  Reproductive health. Screening  You may have the following tests or measurements:  Height, weight, and BMI.  Blood pressure.  Lipid and cholesterol levels. These may be checked every 5 years, or more frequently if you are over 35 years old.  Skin check.  Lung cancer screening. You may have this screening every year starting at age 73 if you have a 30-pack-year history of smoking and currently smoke or have quit within the past 15 years.  Fecal occult blood test (FOBT) of the stool. You may have this test every year starting at age 26.  Flexible sigmoidoscopy or colonoscopy. You may have a sigmoidoscopy every 5 years or a colonoscopy every 10 years starting at age 36.  Hepatitis C blood test.  Hepatitis B blood test.  Sexually transmitted disease (STD) testing.  Diabetes screening. This is done by checking your blood sugar (glucose) after you have not eaten for a while (fasting). You may have this done every 1-3 years.  Bone density scan. This is done to screen for osteoporosis. You may have this done starting at age 29.  Mammogram. This may be done every 1-2 years. Talk to your health care provider about how often you should have regular mammograms. Talk with your health care provider about your test results, treatment options, and if necessary, the need for more  tests. Vaccines  Your health care provider may recommend certain vaccines, such as:  Influenza vaccine. This is recommended every year.  Tetanus, diphtheria, and acellular pertussis (Tdap, Td) vaccine. You  may need a Td booster every 10 years.  Zoster vaccine. You may need this after age 62.  Pneumococcal 13-valent conjugate (PCV13) vaccine. One dose is recommended after age 36.  Pneumococcal polysaccharide (PPSV23) vaccine. One dose is recommended after age 37. Talk to your health care provider about which screenings and vaccines you need and how often you need them. This information is not intended to replace advice given to you by your health care provider. Make sure you discuss any questions you have with your health care provider. Document Released: 02/07/2015 Document Revised: 10/01/2015 Document Reviewed: 11/12/2014 Elsevier Interactive Patient Education  2017 [place] Prevention in the Home Falls can cause injuries. They can happen to people of all ages. There are many things you can do to make your home safe and to help prevent falls. What can I do on the outside of my home?  Regularly fix the edges of walkways and driveways and fix any cracks.  Remove anything that might make you trip as you walk through a door, such as a raised step or threshold.  Trim any bushes or trees on the path to your home.  Use bright outdoor lighting.  Clear any walking paths of anything that might make someone trip, such as rocks or tools.  Regularly check to see if handrails are loose or broken. Make sure that both sides of any steps have handrails.  Any raised decks and porches should have guardrails on the edges.  Have any leaves, snow, or ice cleared regularly.  Use sand or salt on walking paths during winter.  Clean up any spills in your garage right away. This includes oil or grease spills. What can I do in the bathroom?  Use night lights.  Install grab bars by the toilet and in the tub and shower. Do not use towel bars as grab bars.  Use non-skid mats or decals in the tub or shower.  If you need to sit down in the shower, use a plastic, non-slip stool.  Keep the floor  dry. Clean up any water that spills on the floor as soon as it happens.  Remove soap buildup in the tub or shower regularly.  Attach bath mats securely with double-sided non-slip rug tape.  Do not have throw rugs and other things on the floor that can make you trip. What can I do in the bedroom?  Use night lights.  Make sure that you have a light by your bed that is easy to reach.  Do not use any sheets or blankets that are too big for your bed. They should not hang down onto the floor.  Have a firm chair that has side arms. You can use this for support while you get dressed.  Do not have throw rugs and other things on the floor that can make you trip. What can I do in the kitchen?  Clean up any spills right away.  Avoid walking on wet floors.  Keep items that you use a lot in easy-to-reach places.  If you need to reach something above you, use a strong step stool that has a grab bar.  Keep electrical cords out of the way.  Do not use floor polish or wax that makes floors slippery. If you must use wax, use non-skid floor  wax.  Do not have throw rugs and other things on the floor that can make you trip. What can I do with my stairs?  Do not leave any items on the stairs.  Make sure that there are handrails on both sides of the stairs and use them. Fix handrails that are broken or loose. Make sure that handrails are as long as the stairways.  Check any carpeting to make sure that it is firmly attached to the stairs. Fix any carpet that is loose or worn.  Avoid having throw rugs at the top or bottom of the stairs. If you do have throw rugs, attach them to the floor with carpet tape.  Make sure that you have a light switch at the top of the stairs and the bottom of the stairs. If you do not have them, ask someone to add them for you. What else can I do to help prevent falls?  Wear shoes that:  Do not have high heels.  Have rubber bottoms.  Are comfortable and fit you  well.  Are closed at the toe. Do not wear sandals.  If you use a stepladder:  Make sure that it is fully opened. Do not climb a closed stepladder.  Make sure that both sides of the stepladder are locked into place.  Ask someone to hold it for you, if possible.  Clearly mark and make sure that you can see:  Any grab bars or handrails.  First and last steps.  Where the edge of each step is.  Use tools that help you move around (mobility aids) if they are needed. These include:  Canes.  Walkers.  Scooters.  Crutches.  Turn on the lights when you go into a dark area. Replace any light bulbs as soon as they burn out.  Set up your furniture so you have a clear path. Avoid moving your furniture around.  If any of your floors are uneven, fix them.  If there are any pets around you, be aware of where they are.  Review your medicines with your doctor. Some medicines can make you feel dizzy. This can increase your chance of falling. Ask your doctor what other things that you can do to help prevent falls. This information is not intended to replace advice given to you by your health care provider. Make sure you discuss any questions you have with your health care provider. Document Released: 11/07/2008 Document Revised: 06/19/2015 Document Reviewed: 02/15/2014 Elsevier Interactive Patient Education  2017 [employer].

## 2019-11-29 NOTE — Progress Notes (Addendum)
Subjective:   Colleen [name] is a 74 y.o. female who presents for Medicare Annual (Subsequent) preventive examination.  Virtual Visit via Telephone Note  I connected with  Colleen [name] on 11/29/19 at 10:40 AM EDT by telephone and verified that I am speaking with the correct person using two identifiers.  Medicare Annual Wellness visit completed telephonically due to Covid-19 pandemic.   Location: Patient: home Provider: [place]   I discussed the limitations, risks, security and privacy concerns of performing an evaluation and management service by telephone and the availability of in person appointments. The patient expressed understanding and agreed to proceed.  Unable to perform video visit due to video visit attempted and failed and/or patient does not have video capability.   Some vital signs may be absent or patient reported.   Clemetine Marker, LPN    Review of Systems     Cardiac Risk Factors include: advanced age (>79mn, >>78women);dyslipidemia;hypertension;obesity (BMI >30kg/m2);sedentary lifestyle     Objective:    Today's Vitals   11/29/19 1044  PainSc: 5    There is no height or weight on file to calculate BMI.  Advanced Directives 11/29/2019 02/11/2019 02/10/2019 09/10/2018 09/10/2018 09/05/2018 08/02/2018  Does Patient Have a Medical Advance Directive? Yes Yes Yes - Yes No No  Type of Advance Directive [place] will Healthcare Power of [place]- -  Does patient want to make changes to medical advance directive? - No - Patient declined No - Patient declined No - Patient declined - No - Patient declined No - Patient declined  Copy of [place] Chart? No - copy requested No - copy requested No - copy requested - - - -  Would patient like information on creating a medical advance directive? - - - - - - No - Patient declined    Current Medications  (verified) Outpatient Encounter Medications as of 11/29/2019  Medication Sig   allopurinol (ZYLOPRIM) 100 MG tablet Take 2 tablets (200 mg total) by mouth daily.   aspirin EC 81 MG tablet Take 81 mg by mouth daily.   Blood Pressure Monitoring (BLOOD PRESSURE KIT) DEVI 1 Device by Does not apply route daily. Fluctuating blood pressure; please get appropriate size, large cuff   cholecalciferol (VITAMIN D3) 25 MCG (1000 UNIT) tablet Take 1,000 Units by mouth daily.   ELIQUIS 5 MG TABS tablet Take 5 mg by mouth 2 (two) times daily.   enalapril (VASOTEC) 10 MG tablet Take 10 mg by mouth daily.   famotidine (PEPCID) 20 MG tablet Take 20 mg by mouth daily as needed for heartburn or indigestion.   ferrous sulfate 325 (65 FE) MG EC tablet Take 1 tablet (325 mg total) by mouth daily.   hydrochlorothiazide (MICROZIDE) 12.5 MG capsule Take 1 capsule by mouth once daily   lovastatin (MEVACOR) 40 MG tablet Take 1 tablet (40 mg total) by mouth at bedtime.   PROAIR HFA 108 (90 Base) MCG/ACT inhaler INHALE 2 PUFFS INTO LUNGS EVERY 4 HOURS AS NEEDED FOR WHEEZING FOR SHORTNESS OF BREATH   simethicone (GAS-X) 80 MG chewable tablet Chew 1 tablet (80 mg total) by mouth 2 (two) times a day.   SYMBICORT 160-4.5 MCG/ACT inhaler Inhale 2 puffs into the lungs 2 (two) times daily.   triamcinolone cream (KENALOG) 0.1 % APPLY CREAM EXTERNALLY TO AFFECTED AREA TWICE DAILY if needed; not more than one week at a time, then  take a break for two weeks   [DISCONTINUED] enalapril (VASOTEC) 5 MG tablet Take 5 mg by mouth daily.    No facility-administered encounter medications on file as of 11/29/2019.    Allergies (verified) Patient has no known allergies.   History: Past Medical History:  Diagnosis Date   Asthma    Cataract    COPD (chronic obstructive pulmonary disease) (HCC)    GERD (gastroesophageal reflux disease)    Gout    History of pulmonary embolus (PE)    Hyperlipidemia    Hypertension     Osteoarthrosis    Prediabetes    Past Surgical History:  Procedure Laterality Date   BREAST EXCISIONAL BIOPSY Left 04/08/2014   Intraductal papilloma-5 mm   LUMBAR LAMINECTOMY     TONSILLECTOMY     Family History  Problem Relation Age of Onset   Diabetes Mother    Lung cancer Father    Diabetes Sister    Congestive Heart Failure Sister    Glaucoma Sister    Breast cancer Neg Hx    Social History   Socioeconomic History   Marital status: Widowed    Spouse name: Titus Dubin   Number of children: 0   Years of education: Not on file   Highest education level: 12th grade  Occupational History   Occupation: Retired  Tobacco Use   Smoking status: Former Smoker    Packs/day: 1.50    Years: 20.00    Pack years: 30.00    Types: Cigarettes    Quit date: 11/13/2008    Years since quitting: 11.0   Smokeless tobacco: Never Used   Tobacco comment: smoking cessation materials not required  Vaping Use   Vaping Use: Never used  Substance and Sexual Activity   Alcohol use: Not Currently    Alcohol/week: 1.0 standard drink    Types: 1 Cans of beer per week   Drug use: No   Sexual activity: Not Currently  Other Topics Concern   Not on file  Social History Narrative   Pt lives alone   Social Determinants of Health   Financial Resource Strain: Low Risk    Difficulty of Paying Living Expenses: Not hard at all  Food Insecurity: No Food Insecurity   Worried About Charity fundraiser in the Last Year: Never true   [place] in the Last Year: Never true  Transportation Needs: No Transportation Needs   Lack of Transportation (Medical): No   Lack of Transportation (Non-Medical): No  Physical Activity: Inactive   Days of Exercise per Week: 0 days   Minutes of Exercise per Session: 0 min  Stress: No Stress Concern Present   Feeling of Stress : Not at all  Social Connections: Moderately Isolated   Frequency of Communication with Friends and  Family: More than three times a week   Frequency of Social Gatherings with Friends and Family: Twice a week   Attends Religious Services: More than 4 times per year   Active Member of [employer] or Organizations: No   Attends Archivist Meetings: Never   Marital Status: Widowed    Tobacco Counseling Counseling given: Not Answered Comment: smoking cessation materials not required   Clinical Intake:  Pre-visit preparation completed: Yes  Pain : 0-10 Pain Score: 5  Pain Type: Chronic pain Pain Location: Knee Pain Orientation: Right, Left Pain Descriptors / Indicators: Aching, Sore Pain Onset: More than a month ago Pain Frequency: Constant     Nutritional Status: BMI >  30  Obese Nutritional Risks: None Diabetes: No  How often do you need to have someone help you when you read instructions, pamphlets, or other written materials from your doctor or pharmacy?: 1 - Never    Interpreter Needed?: No  Information entered by :: Clemetine Marker LPN   Activities of Daily Living In your present state of health, do you have any difficulty performing the following activities: 11/29/2019 10/16/2019  Hearing? N N  Comment declines hearing aids -  Vision? N N  Difficulty concentrating or making decisions? N N  Walking or climbing stairs? Y Y  Dressing or bathing? Y Y  Doing errands, shopping? Tempie Donning  Preparing Food and eating ? N -  Using the Toilet? N -  In the past six months, have you accidently leaked urine? Y -  Do you have problems with loss of bowel control? N -  Managing your Medications? N -  Managing your Finances? N -  Housekeeping or managing your Housekeeping? N -  Some recent data might be hidden    Patient Care Team: Delsa Grana, PA-C as PCP - General (Family Medicine) Kate Sable, MD as PCP - Cardiology (Cardiology) Inda Castle, MD as Consulting Physician (Rheumatology) Benedetto Goad, RN as Registered Nurse  Indicate any recent Medical  Services you may have received from other than [facility] providers in the past year (date may be approximate).     Assessment:   This is a routine wellness examination for Colleen [name].  Hearing/Vision screen  Hearing Screening   '125Hz'  '250Hz'  '500Hz'  '1000Hz'  '2000Hz'  '3000Hz'  '4000Hz'  '6000Hz'  '8000Hz'   Right ear:           Left ear:           Comments: Pt denies hearing difficulty  Vision Screening Comments: Annual vision screenings done at [facility]  Dietary issues and exercise activities discussed: Current Exercise Habits: The patient does not participate in regular exercise at present, Exercise limited by: orthopedic condition(s)  Goals      DIET - INCREASE WATER INTAKE      Recommend to drink at least 6-8 8oz glasses of water per day.      My blood pressure has been running high since Dr. Sanda Klein adjusted my medicine (pt-stated)      Current Barriers:   Knowledge Deficits related to basic understanding of hypertension pathophysiology and self care management   Nurse Case Manager Clinical Goal(s):   Over the next 14 days, patient will demonstrate improved adherence to prescribed treatment plan for hypertension as evidenced by taking all medications as prescribed, monitoring and recording blood pressure as directed, adhering to low sodium/DASH diet  Interventions:   Evaluation of current treatment plan related to hypertension self management and patient's adherence to plan as established by provider.  Provided education to patient re: stroke prevention, s/s of heart attack and stroke, DASH diet, complications of uncontrolled blood pressure  Reviewed medications with patient and discussed importance of compliance  Discussed plans with patient for ongoing care management follow up and provided patient with direct contact information for care management team  Advised patient, providing education and rationale, to monitor blood pressure daily and record, calling PCP for findings outside  established parameters.   Reviewed scheduled/upcoming provider appointments including:   Patient Self Care Activities:   Self administers medications as prescribed  Attends all scheduled provider appointments  Calls provider office for new concerns, questions, or BP outside discussed parameters  Checks BP and records as discussed  Follows a low sodium diet/DASH diet  Initial goal documentation        Reduce sugar intake      Continue decreasing sugars in daily diet. Pt to avoid breads, sodas and tea.,       Depression Screen PHQ 2/9 Scores 11/29/2019 10/16/2019 06/12/2019 05/01/2019 03/06/2019 09/20/2018 09/05/2018  PHQ - 2 Score 2 3 0 0 0 0 0  PHQ- 9 Score 2 3 0 0 0 0 0    Fall Risk Fall Risk  11/29/2019 10/16/2019 06/12/2019 05/01/2019 03/06/2019  Falls in the past year? 0 0 0 0 1  Number falls in past yr: 0 0 0 0 1  Injury with Fall? 0 0 0 0 1  Comment - - - - -  Risk for fall due to : No Fall Risks Impaired balance/gait - - -  Risk for fall due to: Comment - - - - -  Follow up Falls prevention discussed Falls evaluation completed - - -    Any stairs in or around the home? Yes  If so, are there any without handrails? No  Home free of loose throw rugs in walkways, pet beds, electrical cords, etc? Yes  Adequate lighting in your home to reduce risk of falls? Yes   ASSISTIVE DEVICES UTILIZED TO PREVENT FALLS:  Life alert? No  Use of a cane, walker or w/c? Yes  Grab bars in the bathroom? No  Shower chair or bench in shower? Yes  Elevated toilet seat or a handicapped toilet? Yes   TIMED UP AND GO:  Was the test performed? No . Telephonic visit.   Cognitive Function:     6CIT Screen 11/29/2019 09/05/2018 09/01/2017 08/16/2016  What Year? 0 points 0 points 0 points 0 points  What month? 0 points 0 points 0 points 0 points  What time? 0 points 0 points 0 points 0 points  Count back from 20 0 points 0 points 4 points 0 points  Months in reverse 0 points 0 points 0 points 0 points   Repeat phrase 2 points 2 points 6 points 8 points  Total Score '2 2 10 8    ' Immunizations Immunization History  Administered Date(s) Administered   Fluad Quad(high Dose 65+) 10/26/2018, 10/16/2019   Influenza, High Dose Seasonal PF 10/04/2016, 10/12/2017   Influenza-Unspecified 10/26/2014, 09/27/2015, 10/26/2018   Moderna SARS-COVID-2 Vaccination 11/21/2019   PFIZER SARS-COV-2 Vaccination 02/01/2019, 02/22/2019   Pneumococcal Conjugate-13 02/21/2013   Pneumococcal Polysaccharide-23 10/13/2011   Tdap 10/13/2011    TDAP status: Up to date   Flu Vaccine status: Up to date   Pneumococcal vaccine status: Up to date   Covid-19 vaccine status: Completed vaccines  Qualifies for Shingles Vaccine? Yes   Zostavax completed No   Shingrix Completed?: No.    Education has been provided regarding the importance of this vaccine. Patient has been advised to call insurance company to determine out of pocket expense if they have not yet received this vaccine. Advised may also receive vaccine at local pharmacy or Health Dept. Verbalized acceptance and understanding.  Screening Tests Health Maintenance  Topic Date Due   Fecal DNA (Cologuard)  10/21/2019   MAMMOGRAM  05/02/2020   TETANUS/TDAP  10/12/2021   INFLUENZA VACCINE  Completed   DEXA SCAN  Completed   COVID-19 Vaccine  Completed   Hepatitis C Screening  Completed   PNA vac Low Risk Adult  Completed    Health Maintenance  Health Maintenance Due  Topic Date Due   Fecal  DNA (Cologuard)  10/21/2019   Colorectal cancer screening: Cologuard completed 10/20/16. Patient states she is planning to have colonoscopy in 2022.   Mammogram status: Completed 05/03/19. Repeat every year   Bone Density status: Completed 05/18/17. Results reflect: Bone density results: OSTEOPENIA. Repeat every 2 years. Pt declines repeat screening at this time.   Lung Cancer Screening: (Low Dose CT Chest recommended if Age 52-80 years, 30 pack-year  currently smoking OR have quit w/in 15years.) does not qualify.   Additional Screening:  Hepatitis C Screening: does qualify; Completed 02/21/13  Vision Screening: Recommended annual ophthalmology exams for early detection of glaucoma and other disorders of the eye. Is the patient up to date with their annual eye exam?  Yes  Who is the provider or what is the name of the office in which the patient attends annual eye exams? [place] Screening: Recommended annual dental exams for proper oral hygiene  Community Resource Referral / Chronic Care Management: CRR required this visit?  No   CCM required this visit?  No      Plan:     I have personally reviewed and noted the following in the patients chart:    Medical and social history  Use of alcohol, tobacco or illicit drugs   Current medications and supplements  Functional ability and status  Nutritional status  Physical activity  Advanced directives  List of other physicians  Hospitalizations, surgeries, and ER visits in previous 12 months  Vitals  Screenings to include cognitive, depression, and falls  Referrals and appointments  In addition, I have reviewed and discussed with patient certain preventive protocols, quality metrics, and best practice recommendations. A written personalized care plan for preventive services as well as general preventive health recommendations were provided to patient.     Clemetine Marker, LPN   80/06/9994   Nurse Notes: pt c/o knee pain, scheduled to see specialist 12/06/19

## 2019-12-06 DIAGNOSIS — Z6841 Body Mass Index (BMI) 40.0 and over, adult: Secondary | ICD-10-CM | POA: Diagnosis not present

## 2019-12-06 DIAGNOSIS — M170 Bilateral primary osteoarthritis of knee: Secondary | ICD-10-CM | POA: Diagnosis not present

## 2019-12-06 DIAGNOSIS — M25562 Pain in left knee: Secondary | ICD-10-CM | POA: Diagnosis not present

## 2019-12-06 DIAGNOSIS — M25561 Pain in right knee: Secondary | ICD-10-CM | POA: Diagnosis not present

## 2019-12-12 DIAGNOSIS — I2699 Other pulmonary embolism without acute cor pulmonale: Secondary | ICD-10-CM | POA: Diagnosis not present

## 2019-12-12 DIAGNOSIS — D509 Iron deficiency anemia, unspecified: Secondary | ICD-10-CM | POA: Diagnosis not present

## 2020-02-12 ENCOUNTER — Ambulatory Visit: Payer: PPO | Admitting: Family Medicine

## 2020-02-28 ENCOUNTER — Encounter: Payer: Self-pay | Admitting: Family Medicine

## 2020-02-28 ENCOUNTER — Ambulatory Visit (INDEPENDENT_AMBULATORY_CARE_PROVIDER_SITE_OTHER): Payer: PPO | Admitting: Family Medicine

## 2020-02-28 ENCOUNTER — Other Ambulatory Visit: Payer: Self-pay

## 2020-02-28 VITALS — BP 115/70 | HR 94 | Temp 97.5°F | Ht 65.0 in | Wt 277.6 lb

## 2020-02-28 DIAGNOSIS — Z7901 Long term (current) use of anticoagulants: Secondary | ICD-10-CM | POA: Diagnosis not present

## 2020-02-28 DIAGNOSIS — I4891 Unspecified atrial fibrillation: Secondary | ICD-10-CM | POA: Diagnosis not present

## 2020-02-28 DIAGNOSIS — I2699 Other pulmonary embolism without acute cor pulmonale: Secondary | ICD-10-CM

## 2020-02-28 DIAGNOSIS — D75839 Thrombocytosis, unspecified: Secondary | ICD-10-CM

## 2020-02-28 DIAGNOSIS — D509 Iron deficiency anemia, unspecified: Secondary | ICD-10-CM | POA: Diagnosis not present

## 2020-02-28 DIAGNOSIS — N1831 Chronic kidney disease, stage 3a: Secondary | ICD-10-CM | POA: Diagnosis not present

## 2020-02-28 DIAGNOSIS — I10 Essential (primary) hypertension: Secondary | ICD-10-CM

## 2020-02-28 DIAGNOSIS — Z5181 Encounter for therapeutic drug level monitoring: Secondary | ICD-10-CM | POA: Diagnosis not present

## 2020-02-28 DIAGNOSIS — I700 Atherosclerosis of aorta: Secondary | ICD-10-CM

## 2020-02-28 DIAGNOSIS — D473 Essential (hemorrhagic) thrombocythemia: Secondary | ICD-10-CM

## 2020-02-28 DIAGNOSIS — R7303 Prediabetes: Secondary | ICD-10-CM

## 2020-02-28 DIAGNOSIS — M170 Bilateral primary osteoarthritis of knee: Secondary | ICD-10-CM | POA: Diagnosis not present

## 2020-02-28 DIAGNOSIS — E782 Mixed hyperlipidemia: Secondary | ICD-10-CM | POA: Diagnosis not present

## 2020-02-28 DIAGNOSIS — J441 Chronic obstructive pulmonary disease with (acute) exacerbation: Secondary | ICD-10-CM | POA: Diagnosis not present

## 2020-02-28 DIAGNOSIS — L309 Dermatitis, unspecified: Secondary | ICD-10-CM

## 2020-02-28 DIAGNOSIS — E785 Hyperlipidemia, unspecified: Secondary | ICD-10-CM | POA: Diagnosis not present

## 2020-02-28 MED ORDER — SYMBICORT 160-4.5 MCG/ACT IN AERO: 2.0000 | INHALATION_SPRAY | Freq: Two times a day (BID) | RESPIRATORY_TRACT | 5 refills | Status: DC

## 2020-02-28 MED ORDER — TRIAMCINOLONE ACETONIDE 0.1 % EX CREA: TOPICAL_CREAM | CUTANEOUS | 0 refills | Status: DC

## 2020-02-28 MED ORDER — PROAIR HFA 108 (90 BASE) MCG/ACT IN AERS: INHALATION_SPRAY | RESPIRATORY_TRACT | 5 refills | Status: DC

## 2020-02-28 MED ORDER — LOVASTATIN 40 MG PO TABS: 40.0000 mg | ORAL_TABLET | Freq: Every day | ORAL | 3 refills | Status: DC

## 2020-02-28 MED ORDER — ENALAPRIL MALEATE 10 MG PO TABS: 10.0000 mg | ORAL_TABLET | Freq: Every day | ORAL | 1 refills | Status: DC

## 2020-02-28 NOTE — Patient Instructions (Addendum)
Phone: [phone] Fax: [phone]    [facility] - you should be contacted by the referral team or [place] office    Preventing Osteoporosis, Adult Osteoporosis is a condition that causes the bones to lose density. This means that the bones become thinner, and the normal spaces in bone tissue become larger. Low bone density can make the bones weak and cause them to break more easily. Osteoporosis cannot always be prevented, but you can take steps to lower your risk of developing this condition. How can this condition affect me? If you develop osteoporosis, you will be more likely to break bones in your wrist, spine, or hip. Even a minor accident or injury can be enough to break weak bones. The bones will also be slower to heal. Osteoporosis can cause other problems as well, such as a stooped posture or trouble with movement. Osteoporosis can occur with aging. As you get older, you may lose bone tissue more quickly, or it may be replaced more slowly. Osteoporosis is more likely to develop if you have poor nutrition or do not get enough calcium or vitamin D. Other lifestyle factors can also play a role. By eating a well-balanced diet and making lifestyle changes, you can help keep your bones strong and healthy, lowering your chances of developing osteoporosis. What can increase my risk? The following factors may make you more likely to develop osteoporosis:  Having a family history of the condition.  Having poor nutrition or not getting enough calcium or vitamin D.  Using certain medicines, such as steroid medicines or anti-seizure medicines.  Being any of the following: ? 23 years of age or older. ? Female. ? A woman who has gone through menopause (is postmenopausal). ? A person who is of European or Asian descent.  Using products that contain nicotine or tobacco, such as cigarettes, e-cigarettes, and chewing tobacco.  Not being physically active (being sedentary).  Having a  small body frame. What actions can I take to prevent this? Get enough calcium  Make sure you get enough calcium every day. Calcium is the most important mineral for bone health. Most people can get enough calcium from their diet, but supplements may be recommended for people who are at risk for osteoporosis. Follow these guidelines: ? If you are age 19 or younger, aim to get 1,000 milligrams (mg) of calcium every day. ? If you are older than age 66, aim to get 1,200 mg of calcium every day.  Good sources of calcium include: ? Dairy products, such as low-fat or nonfat milk, cheese, and yogurt. ? Dark green leafy vegetables, such as bok choy and broccoli. ? Foods that have had calcium added to them (calcium-fortified foods), such as orange juice, cereal, bread, soy beverages, and tofu products. ? Nuts, such as almonds.  Check nutrition labels to see how much calcium is in a food or drink.   Get enough vitamin D  Try to get enough vitamin D every day. Vitamin D is the most essential vitamin for bone health. It helps the body absorb calcium. Follow these guidelines for how much vitamin D to get from food: ? If you are age 23 or younger, aim to get at least 600 international units (IU) every day. Your health care provider may suggest more. ? If you are older than age 57, aim to get at least 800 international units every day. Your health care provider may suggest more.  Good sources of vitamin D in your diet include: ?  Egg yolks. ? Oily fish, such as salmon, sardines, and tuna. ? Milk and cereal fortified with vitamin D.  Your body also makes vitamin D when you are out in the sun. Exposing the bare skin on your face, arms, legs, or back to the sun for no more than 30 minutes a day, 2 times a week is more than enough. Beyond that, make sure you use sunblock to protect your skin from sunburn, which increases your risk for skin cancer. Exercise  Stay active and get exercise every day.  Ask your  health care provider what types of exercise are best for you. Weight-bearing and strength-building activities are important for building and maintaining healthy bones. Some examples of these types of activities include: ? Walking and hiking. ? Jogging and running. ? Dancing. ? Gym exercises and lifting weights. ? Tennis and racquetball. ? Climbing stairs. ? Tai chi.   Make other lifestyle changes  Do not use any products that contain nicotine or tobacco, such as cigarettes, e-cigarettes, and chewing tobacco. If you need help quitting, ask your health care provider.  Lose weight if you are overweight.  If you drink alcohol: ? Limit how much you use to:  0-1 drink a day for women who are not pregnant.  0-2 drinks a day for men. ? Be aware of how much alcohol is in your drink. In the [place] one drink equals one 12 oz bottle of beer (355 mL), one 5 oz glass of wine (148 mL), or one 1 oz glass of hard liquor (44 mL). Where to find support If you need help making changes to prevent osteoporosis, talk with your health care provider. You can ask for a referral to a dietitian and a physical therapist. Where to find more information Learn more about osteoporosis from:  NIH Osteoporosis and Related [place]: www.bones.SouthExposed.es  [place] Office on [employer] Health: [place].tn  [place]: EquipmentWeekly.com.ee Summary  Osteoporosis is a condition that causes weak bones that are more likely to break.  Eat a healthy diet, making sure you get enough calcium and vitamin D, and stay active by getting regular exercise to help prevent osteoporosis.  Other ways to reduce your risk of osteoporosis include maintaining a healthy weight and avoiding alcohol and products that contain nicotine or tobacco. This information is not intended to replace advice given to you by your health care provider. Make sure you discuss any questions you have with your health  care provider. Document Revised: 06/28/2019 Document Reviewed: 06/28/2019 Elsevier Patient Education  [place].

## 2020-02-28 NOTE — Progress Notes (Signed)
Name: Colleen [name]   MRN: [id number]    DOB: [birth year]   Date:02/28/2020       Progress Note  Chief Complaint  Patient presents with  . Hypertension     Subjective:   Colleen [name] is a 75 y.o. female, presents to clinic for routine follow-up  Patient has seen several specialist over the past 6 to 9 months including cardiology, hematology, Ortho and she has been waiting to hear from GI for follow-up  Last year she had unprovoked PE, new onset A. fib, dyspnea with exertion-was diagnosed at [facility] emergency room last March after working up worsening dyspnea exertion and anemia with both PCP and cardiology  She follows with [facility] hematology-was put on anticoagulants for 6 months -for unprovoked right LL subsegmental PE on 04/15/2019 Iron deficiency anemia progressive anemia beginning mid 2020 with decline in hemoglobin from 12.0-9.1 with MCV from 90-78 in early 2021 patient was started on oral iron and was referred early last year to follow-up with GI March 2021 we did take home stool cards at that time which were negative x3 She did June 2020 1 GI consult and was going to continue following with [facility] hematology As of last September she had additionally gotten infusions per hematology  Labs reviewed per our EMR and through care everywhere 12/12/2019 H/H 11.9/36.1 08/08/2019 HH 8.9/29.4 mcv 11/17/7/14 -  83.6/92.3 Iron panel 12/12/2019  -iron 97, TIBC 261 iron saturation 37, ferritin 212.6 (all WNL per [facility] lab reference range) Hemoglobin  Date Value Ref Range Status  03/06/2019 9.2 (L) 11.7 - 15.5 g/dL Final  02/10/2019 9.1 (L) 12.0 - 15.0 g/dL Final  09/10/2018 11.6 (L) 12.0 - 15.0 g/dL Final  Patient states that hematology office visit from 11/17 said that they were going to send another order for GI follow-up to Dr. Bonna Gains -patient states that she has not got a call from GI but she also does not answer any phone calls that did not stay there from [facility] health because of too many spam  and sales calls She is an established patient [facility] Dr[facility] Bonna Gains  Reviewed through care everywhere last hematology note patient instructions included the following: Ancil Linsey, MD - 12/12/2019 9:45 AM EST  Formatting of this note might be different from the original. Continue Eliquis 5 mg twice daily. Continue oral iron pills twice daily but you can decrease to once daily if the constipation becomes a significant issue. I recommend getting the scopes done whenever able. Return to see me in 4 months. We can give more IV iron as needed if iron levels drop low again the future.  Electronically signed by Ancil Linsey, MD at 12/12/2019 10:25 AM EST  HPI and A&P: Other Consulting Physicians: Vonda Antigua - [facility]  Reason for Visit: Follow up R pulmonary embolus and iron deficiency anemia  Assessment/Recommendations: Colleen [name] is a 75 y.o. African American female who presents for follow up of R pulmonary embolus and iron deficiency anemia.  1. Venous thromboembolism: First episode was unprovoked RLL segmental PE on 04/15/19. [facility] records available. No LE symptoms, and BLE dopplers negative. TTE w/o evidence of R heart strain. VTE risk factors: a) morbid obesity. Admitted overnight and started on apixaban, which patient remains on to date without major bleeding issues. Peak level obtained due to history of gastric stapling surgery, which returned in expected range. Co-pay is "affordable". SOB has now returned to previous baseline. Of note, patient received Martin  vaccine in late 01/2019.  Reviewed with patient again that general recommendations for unprovoked VTE are to continue anticoagulation long term given the increased risk of recurrence (~20-30% over 5 years for females) if anticoagulation is stopped. Patient has no objections to this and will therefore continue apixaban 5 mg BID. Could consider stopping in the future if patient strongly  prefers so, but would use APLA testing and D-dimer (on treatment and at 4 weeks off) to further risk stratify.   Since patient has completed > 3 months of anticoagulation, she can proceed with GI bleeding workup with EGD/colonoscopy at any time. For the procedure, recommend patient hold apixaban for 2 full days prior to procedure and the morning of. It should be restarted as soon as possible from a bleeding risk perspective after the procedure.   2. Iron deficiency anemia: Per available records, patient has had new onset progressive anemia beginning mid 2020 with decline in Hgb from 12.0 to 9.1 and MCV from 90 to 78 in early 2021. Iron parameters in early 02/2019 consistent with iron deficiency with ferritin of 8. Started on oral iron daily at that time, which was increased to BID at last visit in mid 07/2019. Tolerating reasonably well with some constipation.  The cause for the new iron deficiency is not clear but likely related to GI blood losses. Query the possibility of impaired iron absorption given prior stomach stapling surgery, however, this occurred over 40 years ago and would be unlikely to manifest as an issue now, so again blood loss seems to be the more likely cause.   Received INFED 3100 mg in mid 07/2019 for Hgb of 8.9 that had not responded to trial of oral iron. Hgb has improved today to 11.9 with normal iron parameters. Recommend patient continue on oral iron for now to help offset any potential GI blood losses, but can decrease dose back to daily if constipation remains an issue. Return in 4 months for repeat labs and evaluation. Can arrange further IV iron infusions as needed. Again, recommend proceeding with GI bleed evaluation as per above.  3. Thrombocytosis: Resolved after IV iron therapy.  Plans: 1. Continue apixaban 5 mg BID, with tentative plans for long term anticoagulation. 2. If patient prefers to come off in future, would use APLA testing and D-dimer for further risk  stratification. 3. Recommend proceeding with EGD/colonoscopy at any time since she has completed >3 months of apixaban. 4. Peri procedure anticoagulation recommendations as per above. 5. No indication for IV iron today. 6. Continue oral iron to help offset any potential ongoing GI blood losses but can decrease back to daily if constipation remains an issue. 7. Return in 4 months.  Yevonne Pax, MD   Hypertension:  Currently managed on enalapril 10 mg Pt reports good med compliance and denies any SE.   Blood pressure today is well controlled. BP Readings from Last 3 Encounters:  02/28/20 115/70  10/25/19 102/62  10/16/19 122/78   Pt denies CP, SOB, exertional sx, LE edema, palpitation, Ha's, visual disturbances, lightheadedness, hypotension, syncope. Dietary efforts for BP?  Working on [employer]   Asthma/COPD symbicort and albuterol rescue - recent cold but did not need steroids, she did well with otc meds and using her albuterol a little more    Needs GI f/up - referral from hematology - last year had negative hemocullt take home stool cards  History of proximal A. fib that occurred with last years ER visit and hospitalization for unprovoked PE she  is following with cardiology no palpitations, shortness of breath, orthopnea, lower extremity edema  Bilateral knee pain, working with Ortho surgery with possible future knee replacement she has been losing weight -continues to walk with walker      Current Outpatient Medications:  .  allopurinol (ZYLOPRIM) 100 MG tablet, Take 2 tablets (200 mg total) by mouth daily., Disp: 180 tablet, Rfl: 3 .  aspirin EC 81 MG tablet, Take 81 mg by mouth daily., Disp: , Rfl:  .  cholecalciferol (VITAMIN D3) 25 MCG (1000 UNIT) tablet, Take 1,000 Units by mouth daily., Disp: , Rfl:  .  ELIQUIS 5 MG TABS tablet, Take 5 mg by mouth 2 (two) times daily., Disp: , Rfl:  .  enalapril (VASOTEC) 10 MG tablet, Take 10 mg by mouth daily., Disp: ,  Rfl:  .  famotidine (PEPCID) 20 MG tablet, Take 20 mg by mouth daily as needed for heartburn or indigestion., Disp: , Rfl:  .  ferrous sulfate 325 (65 FE) MG EC tablet, Take 1 tablet (325 mg total) by mouth daily., Disp: 90 tablet, Rfl: 1 .  hydrochlorothiazide (MICROZIDE) 12.5 MG capsule, Take 1 capsule by mouth once daily, Disp: 90 capsule, Rfl: 3 .  lovastatin (MEVACOR) 40 MG tablet, Take 1 tablet (40 mg total) by mouth at bedtime., Disp: 90 tablet, Rfl: 3 .  PROAIR HFA 108 (90 Base) MCG/ACT inhaler, INHALE 2 PUFFS INTO LUNGS EVERY 4 HOURS AS NEEDED FOR WHEEZING FOR SHORTNESS OF BREATH, Disp: 18 g, Rfl: 5 .  simethicone (GAS-X) 80 MG chewable tablet, Chew 1 tablet (80 mg total) by mouth 2 (two) times a day., Disp: 60 tablet, Rfl: 2 .  SYMBICORT 160-4.5 MCG/ACT inhaler, Inhale 2 puffs into the lungs 2 (two) times daily., Disp: 33 g, Rfl: 5 .  triamcinolone cream (KENALOG) 0.1 %, APPLY CREAM EXTERNALLY TO AFFECTED AREA TWICE DAILY if needed; not more than one week at a time, then take a break for two weeks, Disp: 80 g, Rfl: 0 .  Blood Pressure Monitoring (BLOOD PRESSURE KIT) DEVI, 1 Device by Does not apply route daily. Fluctuating blood pressure; please get appropriate size, large cuff (Patient not taking: Reported on 02/28/2020), Disp: 1 Device, Rfl: 0  Patient Active Problem List   Diagnosis Date Noted  . Atrial fibrillation ([place]) 06/14/2019  . Current use of anticoagulant therapy 06/14/2019  . Iron deficiency anemia 06/14/2019  . Thrombocytosis 06/14/2019  . GERD (gastroesophageal reflux disease) 04/15/2019  . Chronic low back pain 04/15/2019  . Right pulmonary embolus ([place]) 04/15/2019  . Palpitations 03/13/2019  . DOE (dyspnea on exertion) 03/13/2019  . Anemia 03/07/2019  . Atherosclerosis of both carotid arteries 09/20/2018  . Spinal stenosis of lumbar region with neurogenic claudication 09/20/2018  . Near syncope 09/10/2018  . Aortic atherosclerosis ([place]) 09/05/2018  . At high risk  for falls 09/05/2018  . Morbid obesity with BMI of 45.0-49.9, adult ([place]) 08/29/2017  . Osteopenia 05/18/2017  . Stage 3 chronic kidney disease ([place]) 04/21/2017  . Morbid obesity ([place]) 04/21/2017  . Trigger middle finger of left hand 02/28/2017  . Chronic gouty arthropathy without tophi 11/29/2016  . Hypergammaglobulinemia 11/29/2016  . Osteoarthritis of both knees 11/29/2016  . Bilateral primary osteoarthritis of knee 11/29/2016  . Allergic rhinitis, seasonal 07/30/2014  . Gout 07/30/2014  . HLD (hyperlipidemia) 07/30/2014  . Osteoarthritis of knee 07/30/2014  . Prediabetes 07/30/2014  . Asthma, mild persistent 07/30/2014  . COPD (chronic obstructive pulmonary disease) ([place]) 07/09/2008  . Essential hypertension 06/22/2006  Past Surgical History:  Procedure Laterality Date  . BREAST EXCISIONAL BIOPSY Left 04/08/2014   Intraductal papilloma-5 mm  . LUMBAR LAMINECTOMY    . TONSILLECTOMY      Family History  Problem Relation Age of Onset  . Diabetes Mother   . Lung cancer Father   . Diabetes Sister   . Congestive Heart Failure Sister   . Glaucoma Sister   . Breast cancer Neg Hx     Social History   Tobacco Use  . Smoking status: Former Smoker    Packs/day: 1.50    Years: 20.00    Pack years: 30.00    Types: Cigarettes    Quit date: 11/13/2008    Years since quitting: 11.2  . Smokeless tobacco: Never Used  . Tobacco comment: smoking cessation materials not required  Vaping Use  . Vaping Use: Never used  Substance Use Topics  . Alcohol use: Not Currently    Alcohol/week: 1.0 standard drink    Types: 1 Cans of beer per week  . Drug use: No     No Known Allergies  Health Maintenance  Topic Date Due  . Fecal DNA (Cologuard)  10/21/2019  . MAMMOGRAM  05/02/2020  . COVID-19 Vaccine (4 - Booster) 05/21/2020  . TETANUS/TDAP  10/12/2021  . INFLUENZA VACCINE  Completed  . DEXA SCAN  Completed  . Hepatitis C Screening  Completed  . PNA vac Low Risk Adult   Completed    Chart Review Today: I personally reviewed active problem list, medication list, allergies, family history, social history, health maintenance, notes from last encounter, lab results, imaging with the patient/caregiver today.   Review of Systems  Constitutional: Negative.   HENT: Negative.   Eyes: Negative.   Respiratory: Negative.   Cardiovascular: Negative.   Gastrointestinal: Negative.  Negative for blood in stool.  Endocrine: Negative.   Genitourinary: Negative.  Negative for hematuria.  Musculoskeletal: Negative.   Skin: Negative.   Allergic/Immunologic: Negative.   Neurological: Negative.   Hematological: Negative.   Psychiatric/Behavioral: Negative.   All other systems reviewed and are negative.    Objective:   Vitals:   02/28/20 0832  BP: 115/70  Pulse: 94  Temp: (!) 97.5 F (36.4 C)  TempSrc: Oral  SpO2: 98%  Weight: 277 lb 9.6 oz (125.9 kg)  Height: '5\' 5"'  (1.651 m)    Body mass index is 46.2 kg/m.  Physical Exam Vitals and nursing note reviewed.  Constitutional:      General: She is not in acute distress.    Appearance: Normal appearance. She is well-developed. She is obese. She is not ill-appearing, toxic-appearing or diaphoretic.     Interventions: Face mask in place.  HENT:     Head: Normocephalic and atraumatic.     Right Ear: External ear normal.     Left Ear: External ear normal.  Eyes:     General: Lids are normal. No scleral icterus.       Right eye: No discharge.        Left eye: No discharge.     Conjunctiva/sclera: Conjunctivae normal.  Neck:     Trachea: Phonation normal. No tracheal deviation.  Cardiovascular:     Rate and Rhythm: Normal rate and regular rhythm.     Pulses: Normal pulses.          Radial pulses are 2+ on the right side and 2+ on the left side.       Posterior tibial pulses are 2+ on the right  side and 2+ on the left side.     Heart sounds: Normal heart sounds. No murmur heard. No friction rub. No  gallop.   Pulmonary:     Effort: Pulmonary effort is normal. No respiratory distress.     Breath sounds: Normal breath sounds. No stridor. No wheezing, rhonchi or rales.  Chest:     Chest wall: No tenderness.  Abdominal:     General: Bowel sounds are normal. There is no distension.     Palpations: Abdomen is soft.  Musculoskeletal:     Right lower leg: No edema.     Left lower leg: No edema.  Skin:    General: Skin is warm and dry.     Coloration: Skin is not jaundiced or pale.     Findings: No rash.  Neurological:     Mental Status: She is alert.     Motor: No abnormal muscle tone.     Gait: Gait abnormal.     Comments: Antalgic slow gait walks with rolling walker  Psychiatric:        Mood and Affect: Mood normal.        Speech: Speech normal.        Behavior: Behavior normal.         Assessment & Plan:   1. Iron deficiency anemia, unspecified iron deficiency anemia type Reviewed records thoroughly due for follow-up with [facility] for scope-  eval if anemia is due to absorption issues or GI blood loss  Management per GI and hematology Needs GI f/up/scope Plan per Heme:  Improved H/H and iron, currently on po iron Received INFED 3100 mg in mid 07/2019 for Hgb of 8.9 that had not responded to trial of oral iron. Hgb has improved today to 11.9 with normal iron parameters. Recommend patient continue on oral iron for now to help offset any potential GI blood losses, but can decrease dose back to daily if constipation remains an issue. Return in 4 months for repeat labs and evaluation. Can arrange further IV iron infusions as needed. - Ambulatory referral to Gastroenterology - CBC with Differential/Platelet  2. Chronic anticoagulation For unprovoked PE additional hematology management as noted above and below - Ambulatory referral to Gastroenterology - COMPLETE METABOLIC PANEL WITH GFR-monitoring renal function and hemoglobin since no labs have been done for more than 3  months  3. Benign essential HTN Stable, well-controlled blood pressure today -managed on enalapril 10 mg hydrochlorothiazide 12.5 mg - COMPLETE METABOLIC PANEL WITH GFR - enalapril (VASOTEC) 10 MG tablet; Take 1 tablet (10 mg total) by mouth daily.  Dispense: 90 tablet; Refill: 1  4. Aortic atherosclerosis (HCC) On statin, monitoring - COMPLETE METABOLIC PANEL WITH GFR - Lipid panel - lovastatin (MEVACOR) 40 MG tablet; Take 1 tablet (40 mg total) by mouth at bedtime.  Dispense: 90 tablet; Refill: 3  5. Chronic obstructive pulmonary disease with acute exacerbation (HCC) Symptoms are currently well controlled on maintenance inhaler and she is rarely using rescue inhaler - SYMBICORT 160-4.5 MCG/ACT inhaler; Inhale 2 puffs into the lungs 2 (two) times daily.  Dispense: 33 g; Refill: 5 - PROAIR HFA 108 (90 Base) MCG/ACT inhaler; INHALE 2 PUFFS INTO LUNGS EVERY 4 HOURS AS NEEDED FOR WHEEZING FOR SHORTNESS OF BREATH  Dispense: 18 g; Refill: 5  6. Thrombocytosis Monitoring -per hematology  7. Current use of anticoagulant therapy Eliquis long-term -monitoring renal function and hemoglobin hematocrit though primary management is per specialist-hematology at [facility]  8. Hyperlipidemia, unspecified hyperlipidemia type Good  statin compliance on lovastatin 40 mg daily at bedtime she denies any myalgias side effects or concerns no fatigue She is due for annual lipid recheck Encouraged her to continue to work on healthy diet and avoiding transfat and saturated fat Due to bilateral severe chronic knee pain due to osteoarthritis she has limitations with her physical activity - COMPLETE METABOLIC PANEL WITH GFR - Lipid panel  9. Atrial fibrillation, unspecified type ([place]) She had proximal A. fib when hospitalized at [facility], my past evaluation and last cardiology consult the patient was in normal sinus she denies palpitations near syncope shortness of breath orthopnea lower extremity edema change in  respiratory symptoms -on exam patient has regular rate and rhythm today - COMPLETE METABOLIC PANEL WITH GFR  10. Primary osteoarthritis of both knees Working with Ortho specialist -she is a surgical candidate for total knee replacement if she is able to continue to lose little bit more weight -she has been working on her diet changes -both knees still hurt and to continue to use a rolling walker - COMPLETE METABOLIC PANEL WITH GFR  11. Stage 3a chronic kidney disease ([place]) Monitoring renal function, on ACEI - COMPLETE METABOLIC PANEL WITH GFR  12. Encounter for medication monitoring - CBC with Differential/Platelet - COMPLETE METABOLIC PANEL WITH GFR - Lipid panel - Hemoglobin A1c  13. Mixed hyperlipidemia See above - lovastatin (MEVACOR) 40 MG tablet; Take 1 tablet (40 mg total) by mouth at bedtime.  Dispense: 90 tablet; Refill: 3  14. Dermatitis of face Patient asked for refill on her Kenalog cream - refill on her topical steroid she has used to years long term - instructed her to use only very sparingly and warned of topical steroid use skin changes - triamcinolone (KENALOG) 0.1 %; APPLY CREAM EXTERNALLY TO AFFECTED AREA TWICE DAILY if needed; not more than one week at a time, then take a break for two weeks  Dispense: 80 g; Refill: 0  15. Prediabetes Recheck A1c Lab Results  Component Value Date   HGBA1C 6.0 (H) 03/06/2019   Patient has made significant diet changes and has lost some weight hope to see the A1c improve - Hemoglobin A1c  16. Right pulmonary embolus [facility]) Unprovoked onset March 2021, Per hematology, breathing improved, continued oral anticoagulant  17. Morbid obesity (HCC) BMI 46, morbid obesity with associated comorbidities hyperlipidemia, hypertension, A. fib, bilateral severe osteoarthritis of knees, prediabetes, COPD Patient has lost 31 pounds in the past 8 to 9 months -congratulated and encouraged her to continue to work on [employer] efforts with  gradual sustainable and healthy weight loss Wt Readings from Last 5 Encounters:  02/28/20 277 lb 9.6 oz (125.9 kg)  10/25/19 285 lb (129.3 kg)  10/16/19 284 lb 8 oz (129 kg)  07/24/19 (!) 308 lb 6 oz (139.9 kg)  06/18/19 294 lb 4 oz (133.5 kg)   BMI Readings from Last 5 Encounters:  02/28/20 46.20 kg/m  10/25/19 47.43 kg/m  10/16/19 48.83 kg/m  07/24/19 52.93 kg/m  06/18/19 52.12 kg/m   18. Essential (hemorrhagic) thrombocythemia ([place]) Per hematology, monitoring CBC - CBC with Differential/Platelet     Documentation for GI referral and follow-up GI follow up instructions and recommendations per Hematology -available for the review Per care everywhere records "Since patient has completed > 3 months of anticoagulation, she can proceed with GI bleeding workup with EGD/colonoscopy at any time. For the procedure, recommend patient hold apixaban for 2 full days prior to procedure and the morning of. It should be  restarted as soon as possible from a bleeding risk perspective after the procedure.   2. Iron deficiency anemia: Per available records, patient has had new onset progressive anemia beginning mid 2020 with decline in Hgb from 12.0 to 9.1 and MCV from 90 to 78 in early 2021. Iron parameters in early 02/2019 consistent with iron deficiency with ferritin of 8. Started on oral iron daily at that time, which was increased to BID at last visit in mid 07/2019. Tolerating reasonably well with some constipation.  The cause for the new iron deficiency is not clear but likely related to GI blood losses. Query the possibility of impaired iron absorption given prior stomach stapling surgery, however, this occurred over 40 years ago and would be unlikely to manifest as an issue now, so again blood loss seems to be the more likely cause"  Will put in GI referral with comments on how to contact pt - she was also given [facility] info so she can call them and arrange the f/up appt - Explained to  her that I cannot help her if she is unwilling to answer her phone - or if they are unable to contact her -I strongly encouraged that she watch the phone numbers that are calling her and answer if it is [facility] or general [place] phone numbers or she at least screen her voicemails and return calls Even better she can call them herself and the phone number was given to her on her after visit summary today   More than 30 min spent with pt in clinic today for encounter More than 15 min spent of chart review with with multiple specialist reviewing labs, office visit notes, referrals and arranging care Additional 10 to 15 minutes for chart documentation Level 5 time-based billing today    Return in about 6 months (around 08/27/2020) for Routine follow-up.   Delsa Grana, PA-C 02/28/20 8:46 AM

## 2020-02-29 LAB — CBC WITH DIFFERENTIAL/PLATELET
Absolute Monocytes: 454 cells/uL (ref 200–950)
Basophils Absolute: 84 cells/uL (ref 0–200)
Basophils Relative: 1 %
Eosinophils Absolute: 67 cells/uL (ref 15–500)
Eosinophils Relative: 0.8 %
HCT: 30.4 % — ABNORMAL LOW (ref 35.0–45.0)
Hemoglobin: 9.9 g/dL — ABNORMAL LOW (ref 11.7–15.5)
Lymphs Abs: 2503 cells/uL (ref 850–3900)
MCH: 29.7 pg (ref 27.0–33.0)
MCHC: 32.6 g/dL (ref 32.0–36.0)
MCV: 91.3 fL (ref 80.0–100.0)
MPV: 10.1 fL (ref 7.5–12.5)
Monocytes Relative: 5.4 %
Neutro Abs: 5292 cells/uL (ref 1500–7800)
Neutrophils Relative %: 63 %
Platelets: 434 10*3/uL — ABNORMAL HIGH (ref 140–400)
RBC: 3.33 10*6/uL — ABNORMAL LOW (ref 3.80–5.10)
RDW: 13 % (ref 11.0–15.0)
Total Lymphocyte: 29.8 %
WBC: 8.4 10*3/uL (ref 3.8–10.8)

## 2020-02-29 LAB — HEMOGLOBIN A1C
Hgb A1c MFr Bld: 5.4 % of total Hgb (ref <5.7)
Mean Plasma Glucose: 108 mg/dL
eAG (mmol/L): 6 mmol/L

## 2020-02-29 LAB — COMPLETE METABOLIC PANEL WITH GFR
AG Ratio: 1.1 (calc) (ref 1.0–2.5)
ALT: 6 U/L (ref 6–29)
AST: 14 U/L (ref 10–35)
Albumin: 3.6 g/dL (ref 3.6–5.1)
Alkaline phosphatase (APISO): 88 U/L (ref 37–153)
BUN/Creatinine Ratio: 20 (calc) (ref 6–22)
BUN: 22 mg/dL (ref 7–25)
CO2: 27 mmol/L (ref 20–32)
Calcium: 9.5 mg/dL (ref 8.6–10.4)
Chloride: 99 mmol/L (ref 98–110)
Creat: 1.09 mg/dL — ABNORMAL HIGH (ref 0.60–0.93)
GFR, Est African American: 58 mL/min/{1.73_m2} — ABNORMAL LOW (ref >=60)
GFR, Est Non African American: 50 mL/min/{1.73_m2} — ABNORMAL LOW (ref >=60)
Globulin: 3.3 g/dL (calc) (ref 1.9–3.7)
Glucose, Bld: 93 mg/dL (ref 65–99)
Potassium: 4.2 mmol/L (ref 3.5–5.3)
Sodium: 135 mmol/L (ref 135–146)
Total Bilirubin: 0.2 mg/dL (ref 0.2–1.2)
Total Protein: 6.9 g/dL (ref 6.1–8.1)

## 2020-02-29 LAB — LIPID PANEL
Cholesterol: 157 mg/dL (ref <200)
HDL: 58 mg/dL (ref >=50)
LDL Cholesterol (Calc): 80 mg/dL (calc)
Non-HDL Cholesterol (Calc): 99 mg/dL (calc) (ref <130)
Total CHOL/HDL Ratio: 2.7 (calc) (ref <5.0)
Triglycerides: 105 mg/dL (ref <150)

## 2020-03-10 ENCOUNTER — Other Ambulatory Visit: Payer: Self-pay | Admitting: Family Medicine

## 2020-03-10 DIAGNOSIS — M109 Gout, unspecified: Secondary | ICD-10-CM

## 2020-03-12 ENCOUNTER — Other Ambulatory Visit: Payer: Self-pay | Admitting: Family Medicine

## 2020-03-12 DIAGNOSIS — M109 Gout, unspecified: Secondary | ICD-10-CM

## 2020-03-17 DIAGNOSIS — M25562 Pain in left knee: Secondary | ICD-10-CM | POA: Diagnosis not present

## 2020-03-17 DIAGNOSIS — Z86711 Personal history of pulmonary embolism: Secondary | ICD-10-CM | POA: Diagnosis not present

## 2020-03-17 DIAGNOSIS — Z7951 Long term (current) use of inhaled steroids: Secondary | ICD-10-CM | POA: Diagnosis not present

## 2020-03-17 DIAGNOSIS — Z6841 Body Mass Index (BMI) 40.0 and over, adult: Secondary | ICD-10-CM | POA: Diagnosis not present

## 2020-03-17 DIAGNOSIS — J449 Chronic obstructive pulmonary disease, unspecified: Secondary | ICD-10-CM | POA: Diagnosis not present

## 2020-03-17 DIAGNOSIS — I4891 Unspecified atrial fibrillation: Secondary | ICD-10-CM | POA: Diagnosis not present

## 2020-03-17 DIAGNOSIS — M25561 Pain in right knee: Secondary | ICD-10-CM | POA: Diagnosis not present

## 2020-03-17 DIAGNOSIS — E785 Hyperlipidemia, unspecified: Secondary | ICD-10-CM | POA: Diagnosis not present

## 2020-03-17 DIAGNOSIS — Z7901 Long term (current) use of anticoagulants: Secondary | ICD-10-CM | POA: Diagnosis not present

## 2020-03-17 DIAGNOSIS — N183 Chronic kidney disease, stage 3 unspecified: Secondary | ICD-10-CM | POA: Diagnosis not present

## 2020-03-17 DIAGNOSIS — I129 Hypertensive chronic kidney disease with stage 1 through stage 4 chronic kidney disease, or unspecified chronic kidney disease: Secondary | ICD-10-CM | POA: Diagnosis not present

## 2020-03-17 DIAGNOSIS — Z87891 Personal history of nicotine dependence: Secondary | ICD-10-CM | POA: Diagnosis not present

## 2020-03-17 DIAGNOSIS — M1712 Unilateral primary osteoarthritis, left knee: Secondary | ICD-10-CM | POA: Diagnosis not present

## 2020-03-17 DIAGNOSIS — M170 Bilateral primary osteoarthritis of knee: Secondary | ICD-10-CM | POA: Diagnosis not present

## 2020-03-17 DIAGNOSIS — Z7982 Long term (current) use of aspirin: Secondary | ICD-10-CM | POA: Diagnosis not present

## 2020-03-17 DIAGNOSIS — G8929 Other chronic pain: Secondary | ICD-10-CM | POA: Diagnosis not present

## 2020-03-17 DIAGNOSIS — Z79899 Other long term (current) drug therapy: Secondary | ICD-10-CM | POA: Diagnosis not present

## 2020-03-17 DIAGNOSIS — K219 Gastro-esophageal reflux disease without esophagitis: Secondary | ICD-10-CM | POA: Diagnosis not present

## 2020-03-22 ENCOUNTER — Telehealth: Payer: Self-pay | Admitting: Family Medicine

## 2020-03-22 DIAGNOSIS — M109 Gout, unspecified: Secondary | ICD-10-CM

## 2020-03-24 NOTE — Telephone Encounter (Signed)
Pt called and is requesting to know why this medication was denied. She states that she is needing to have this refilled because she only has 2 days left. Please advise.

## 2020-03-25 NOTE — Telephone Encounter (Signed)
Patient called in say that she had a missed call can be reached at  Ph#[phone] or (  [phone] this will be patients new number not sure when it will come into effect)

## 2020-03-25 NOTE — Telephone Encounter (Signed)
Called in.

## 2020-04-14 ENCOUNTER — Encounter: Payer: Self-pay | Admitting: Cardiology

## 2020-04-14 ENCOUNTER — Other Ambulatory Visit: Payer: Self-pay | Admitting: Family Medicine

## 2020-04-14 ENCOUNTER — Other Ambulatory Visit: Payer: Self-pay

## 2020-04-14 ENCOUNTER — Ambulatory Visit: Payer: PPO | Admitting: Cardiology

## 2020-04-14 VITALS — BP 114/60 | HR 74 | Ht 66.0 in | Wt 273.0 lb

## 2020-04-14 DIAGNOSIS — E782 Mixed hyperlipidemia: Secondary | ICD-10-CM

## 2020-04-14 DIAGNOSIS — I10 Essential (primary) hypertension: Secondary | ICD-10-CM | POA: Diagnosis not present

## 2020-04-14 DIAGNOSIS — I480 Paroxysmal atrial fibrillation: Secondary | ICD-10-CM | POA: Diagnosis not present

## 2020-04-14 DIAGNOSIS — I700 Atherosclerosis of aorta: Secondary | ICD-10-CM

## 2020-04-14 NOTE — Progress Notes (Signed)
Cardiology Office Note:    Date:  04/14/2020   ID:  Colleen [name], DOB [birth year], MRN [id number]  PCP:  Delsa Grana, PA-C  Cardiologist:  Kate Sable, MD  Electrophysiologist:  None   Referring MD: Delsa Grana, PA-C   Chief Complaint  Patient presents with  . OTher    6 month follow up - Meds reviewed verbally with patient.     History of Present Illness:    Colleen [name] is a 75 y.o. female with a hx of hypertension, hyperlipidemia, COPD, PE, paroxysmal atrial fibrillation on Eliquis, former smoker who presents for follow-up.    Patient is being seen due to A. fib and also hypertension.  She was started on Toprol-XL but stopped due to reflux symptoms and edema.  States having some palpitations after receiving the Covid booster shot.  Symptoms went on for a couple of months but eventually resolved.  Denies any chest pain or shortness of breath.  States she has been eating healthier and has lost some weight.  Has no concerns at this time.      Prior notes Patient was admitted at [facility] 2021 due to palpitations.  Chest CTA showed PE.  While in the hospital, telemetry monitor did note paroxysmal atrial fibrillation.  Eliquis was started for PE protocol.  Referral to hematology also placed.   Echocardiogram 03/2019 showed normal systolic and diastolic function,  Lexiscan Myoview 05/2019 with no evidence of ischemia.  Dyspnea on exertion was attributed to COPD.   Past Medical History:  Diagnosis Date  . Asthma   . Cataract   . COPD (chronic obstructive pulmonary disease) ([place])   . GERD (gastroesophageal reflux disease)   . Gout   . History of pulmonary embolus (PE)   . Hyperlipidemia   . Hypertension   . Osteoarthrosis   . Prediabetes     Past Surgical History:  Procedure Laterality Date  . BREAST EXCISIONAL BIOPSY Left 04/08/2014   Intraductal papilloma-5 mm  . LUMBAR LAMINECTOMY    . TONSILLECTOMY      Current Medications: Current Meds  Medication Sig  .  allopurinol (ZYLOPRIM) 100 MG tablet Take 2 tablets (200 mg total) by mouth daily.  . Blood Pressure Monitoring (BLOOD PRESSURE KIT) DEVI 1 Device by Does not apply route daily. Fluctuating blood pressure; please get appropriate size, large cuff  . cholecalciferol (VITAMIN D3) 25 MCG (1000 UNIT) tablet Take 1,000 Units by mouth daily.  Marland Kitchen ELIQUIS 5 MG TABS tablet Take 5 mg by mouth 2 (two) times daily.  . enalapril (VASOTEC) 10 MG tablet Take 1 tablet (10 mg total) by mouth daily.  . famotidine (PEPCID) 20 MG tablet Take 20 mg by mouth daily as needed for heartburn or indigestion.  . ferrous sulfate 325 (65 FE) MG EC tablet Take 1 tablet (325 mg total) by mouth daily.  . hydrochlorothiazide (MICROZIDE) 12.5 MG capsule Take 1 capsule by mouth once daily  . lovastatin (MEVACOR) 40 MG tablet Take 1 tablet (40 mg total) by mouth at bedtime.  Marland Kitchen PROAIR HFA 108 (90 Base) MCG/ACT inhaler INHALE 2 PUFFS INTO LUNGS EVERY 4 HOURS AS NEEDED FOR WHEEZING FOR SHORTNESS OF BREATH  . simethicone (GAS-X) 80 MG chewable tablet Chew 1 tablet (80 mg total) by mouth 2 (two) times a day.  . SYMBICORT 160-4.5 MCG/ACT inhaler Inhale 2 puffs into the lungs 2 (two) times daily.  Marland Kitchen triamcinolone (KENALOG) 0.1 % APPLY CREAM EXTERNALLY TO AFFECTED AREA TWICE DAILY if needed; not  more than one week at a time, then take a break for two weeks  . [DISCONTINUED] aspirin EC 81 MG tablet Take 81 mg by mouth daily.     Allergies:   Patient has no known allergies.   Social History   Socioeconomic History  . Marital status: Widowed    Spouse name: Titus Dubin  . Number of children: 0  . Years of education: Not on file  . Highest education level: 12th grade  Occupational History  . Occupation: Retired  Tobacco Use  . Smoking status: Former Smoker    Packs/day: 1.50    Years: 20.00    Pack years: 30.00    Types: Cigarettes    Quit date: 11/13/2008    Years since quitting: 11.4  . Smokeless tobacco: Never Used  . Tobacco  comment: smoking cessation materials not required  Vaping Use  . Vaping Use: Never used  Substance and Sexual Activity  . Alcohol use: Not Currently    Alcohol/week: 1.0 standard drink    Types: 1 Cans of beer per week  . Drug use: No  . Sexual activity: Not Currently  Other Topics Concern  . Not on file  Social History Narrative   Pt lives alone   Social Determinants of Health   Financial Resource Strain: Low Risk   . Difficulty of Paying Living Expenses: Not hard at all  Food Insecurity: No Food Insecurity  . Worried About Charity fundraiser in the Last Year: Never true  . Ran Out of Food in the Last Year: Never true  Transportation Needs: No Transportation Needs  . Lack of Transportation (Medical): No  . Lack of Transportation (Non-Medical): No  Physical Activity: Inactive  . Days of Exercise per Week: 0 days  . Minutes of Exercise per Session: 0 min  Stress: No Stress Concern Present  . Feeling of Stress : Not at all  Social Connections: Moderately Isolated  . Frequency of Communication with Friends and Family: More than three times a week  . Frequency of Social Gatherings with Friends and Family: Twice a week  . Attends Religious Services: More than 4 times per year  . Active Member of Clubs or Organizations: No  . Attends Archivist Meetings: Never  . Marital Status: Widowed     Family History: The patient's family history includes Congestive Heart Failure in her sister; Diabetes in her mother and sister; Glaucoma in her sister; Lung cancer in her father. There is no history of Breast cancer.  ROS:   Please see the history of present illness.     All other systems reviewed and are negative.  EKGs/Labs/Other Studies Reviewed:    The following studies were reviewed today:   EKG:  EKG is  ordered today.  The ekg ordered today demonstrates sinus rhythm  Recent Labs: 02/28/2020: ALT 6; BUN 22; Creat 1.09; Hemoglobin 9.9; Platelets 434; Potassium 4.2;  Sodium 135  Recent Lipid Panel    Component Value Date/Time   CHOL 157 02/28/2020 0905   CHOL 161 07/30/2014 0830   TRIG 105 02/28/2020 0905   HDL 58 02/28/2020 0905   HDL 82 07/30/2014 0830   CHOLHDL 2.7 02/28/2020 0905   VLDL 15 12/31/2015 0922   LDLCALC 80 02/28/2020 0905    Physical Exam:    VS:  BP 114/60 (BP Location: Right Arm, Patient Position: Sitting, Cuff Size: Large)   Pulse 74   Ht '5\' 6"'  (1.676 m)   Wt 273 lb (123.8 kg)  BMI 44.06 kg/m     Wt Readings from Last 3 Encounters:  04/14/20 273 lb (123.8 kg)  02/28/20 277 lb 9.6 oz (125.9 kg)  10/25/19 285 lb (129.3 kg)     GEN:  Well nourished, well developed in no acute distress, obese HEENT: Normal NECK: No JVD; No carotid bruits LYMPHATICS: No lymphadenopathy CARDIAC: Regular, frequent skipped beats, no murmurs, rubs, gallops RESPIRATORY:  Clear to auscultation without rales, wheezing or rhonchi  ABDOMEN: Soft, non-tender, non-distended MUSCULOSKELETAL:  trace edema; No deformity  SKIN: Warm and dry NEUROLOGIC:  Alert and oriented x 3 PSYCHIATRIC:  Normal affect   ASSESSMENT:    1. Primary hypertension   2. Paroxysmal atrial fibrillation (HCC)   3. Morbid obesity ([place])    PLAN:    In order of problems listed above:  1. Hypertension, blood pressure adequately controlled.  Continue HCTZ 12.5 mg daily, enalapril 5 mg daily.  2. paroxysmal atrial fibrillation, currently in sinus rhythm, denies palpitations.  Continue Eliquis.  If she develops palpitations, will start calcium channel blocker Cardizem/verapamil.  Did not tolerate beta-blocker in the past. 3. Patient with morbid obesity, patient encouraged on recent weight loss.  Advised to continue eating healthy, low calorie diet.  Follow-up in 6 months   This note was generated in part or whole with voice recognition software. Voice recognition is usually quite accurate but there are transcription errors that can and very often do occur. I apologize  for any typographical errors that were not detected and corrected.  Medication Adjustments/Labs and Tests Ordered: Current medicines are reviewed at length with the patient today.  Concerns regarding medicines are outlined above.  Orders Placed This Encounter  Procedures  . EKG 12-Lead   No orders of the defined types were placed in this encounter.   Patient Instructions  Medication Instructions:  Your physician has recommended you make the following change in your medication:  STOP Aspirin.  *If you need a refill on your cardiac medications before your next appointment, please call your pharmacy*  Follow-Up: At [facility], you and your health needs are our priority.  As part of our continuing mission to provide you with exceptional heart care, we have created designated Provider Care Teams.  These Care Teams include your primary Cardiologist (physician) and Advanced Practice Providers (APPs -  Physician Assistants and Nurse Practitioners) who all work together to provide you with the care you need, when you need it.  We recommend signing up for the patient portal called "MyChart".  Sign up information is provided on this After Visit Summary.  MyChart is used to connect with patients for Virtual Visits (Telemedicine).  Patients are able to view lab/test results, encounter notes, upcoming appointments, etc.  Non-urgent messages can be sent to your provider as well.   To learn more about what you can do with MyChart, go to [email or web address].    Your next appointment:   6 month(s)  The format for your next appointment:   In Person  Provider:   You may see Kate Sable, MD or one of the following Advanced Practice Providers on your designated Care Team:    Murray Hodgkins, NP  Christell Faith, PA-C  Marrianne Mood, PA-C  Cadence P[place], [place]  Laurann Montana, NP     Signed, Kate Sable, MD  04/14/2020 9:39 AM    [place]

## 2020-04-14 NOTE — Patient Instructions (Signed)
Medication Instructions:  Your physician has recommended you make the following change in your medication:  STOP Aspirin.  *If you need a refill on your cardiac medications before your next appointment, please call your pharmacy*  Follow-Up: At [facility], you and your health needs are our priority.  As part of our continuing mission to provide you with exceptional heart care, we have created designated Provider Care Teams.  These Care Teams include your primary Cardiologist (physician) and Advanced Practice Providers (APPs -  Physician Assistants and Nurse Practitioners) who all work together to provide you with the care you need, when you need it.  We recommend signing up for the patient portal called "MyChart".  Sign up information is provided on this After Visit Summary.  MyChart is used to connect with patients for Virtual Visits (Telemedicine).  Patients are able to view lab/test results, encounter notes, upcoming appointments, etc.  Non-urgent messages can be sent to your provider as well.   To learn more about what you can do with MyChart, go to [email or web address].    Your next appointment:   6 month(s)  The format for your next appointment:   In Person  Provider:   You may see Kate Sable, MD or one of the following Advanced Practice Providers on your designated Care Team:    Murray Hodgkins, NP  Christell Faith, PA-C  Marrianne Mood, PA-C  Cadence Cofield, [place]  Laurann Montana, NP

## 2020-04-15 DIAGNOSIS — I2699 Other pulmonary embolism without acute cor pulmonale: Secondary | ICD-10-CM | POA: Diagnosis not present

## 2020-04-15 DIAGNOSIS — D509 Iron deficiency anemia, unspecified: Secondary | ICD-10-CM | POA: Diagnosis not present

## 2020-04-16 ENCOUNTER — Other Ambulatory Visit: Payer: Self-pay

## 2020-04-16 ENCOUNTER — Telehealth (INDEPENDENT_AMBULATORY_CARE_PROVIDER_SITE_OTHER): Payer: PPO | Admitting: Gastroenterology

## 2020-04-16 DIAGNOSIS — D509 Iron deficiency anemia, unspecified: Secondary | ICD-10-CM

## 2020-04-16 MED ORDER — NA SULFATE-K SULFATE-MG SULF 17.5-3.13-1.6 GM/177ML PO SOLN: 1.0000 | Freq: Once | ORAL | 0 refills | Status: AC

## 2020-04-16 NOTE — Progress Notes (Signed)
Vonda Antigua, MD [address]  [place]  [place], [place] [address]  Main: [phone]  Fax: [phone]   Primary Care Physician: Delsa Grana, PA-C  Virtual Visit via Telephone Note  I connected with patient on 04/16/20 at  3:00 PM EDT by telephone and verified that I am speaking with the correct person using two identifiers.   I discussed the limitations, risks, security and privacy concerns of performing an evaluation and management service by telephone and the availability of in person appointments. I also discussed with the patient that there may be a patient responsible charge related to this service. The patient expressed understanding and agreed to proceed.  Location of Patient: Home Location of Provider: Home Persons involved: Patient and provider only during the visit (nursing staff and front desk staff was involved in communicating with the patient prior to the appointment, reviewing medications and checking them in)   History of Present Illness: Chief Complaint  Patient presents with  . Office Visit    IRA     HPI: ROSEANNA KOPLIN is a 75 y.o. female previously seen for iron deficiency anemia here for follow-up.  EGD and colonoscopy were planned previously, but patient needed cardiology and hematology clearance.  Patient did not follow-up after that visit.  She denies any abdominal pain, nausea or vomiting, or any signs of active bleeding.  She was seen by [facility] hematology just yesterday and as per their note, she can hold her Eliquis for 2 days prior to the procedure  Current Outpatient Medications  Medication Sig Dispense Refill  . allopurinol (ZYLOPRIM) 100 MG tablet Take 2 tablets (200 mg total) by mouth daily. 180 tablet 3  . cholecalciferol (VITAMIN D3) 25 MCG (1000 UNIT) tablet Take 1,000 Units by mouth daily.    Marland Kitchen ELIQUIS 5 MG TABS tablet Take 5 mg by mouth 2 (two) times daily.    . enalapril (VASOTEC) 10 MG tablet Take 1 tablet (10 mg total) by  mouth daily. 90 tablet 1  . famotidine (PEPCID) 20 MG tablet Take 20 mg by mouth daily as needed for heartburn or indigestion.    . ferrous sulfate 325 (65 FE) MG EC tablet Take 1 tablet (325 mg total) by mouth daily. 90 tablet 1  . hydrochlorothiazide (MICROZIDE) 12.5 MG capsule Take 1 capsule by mouth once daily 90 capsule 3  . lovastatin (MEVACOR) 40 MG tablet Take 1 tablet (40 mg total) by mouth at bedtime. 90 tablet 3  . PROAIR HFA 108 (90 Base) MCG/ACT inhaler INHALE 2 PUFFS INTO LUNGS EVERY 4 HOURS AS NEEDED FOR WHEEZING FOR SHORTNESS OF BREATH 18 g 5  . simethicone (GAS-X) 80 MG chewable tablet Chew 1 tablet (80 mg total) by mouth 2 (two) times a day. 60 tablet 2  . SYMBICORT 160-4.5 MCG/ACT inhaler Inhale 2 puffs into the lungs 2 (two) times daily. 33 g 5  . triamcinolone (KENALOG) 0.1 % APPLY CREAM EXTERNALLY TO AFFECTED AREA TWICE DAILY if needed; not more than one week at a time, then take a break for two weeks 80 g 0  . Blood Pressure Monitoring (BLOOD PRESSURE KIT) DEVI 1 Device by Does not apply route daily. Fluctuating blood pressure; please get appropriate size, large cuff 1 Device 0  . Na Sulfate-K Sulfate-Mg Sulf 17.5-3.13-1.6 GM/177ML SOLN Take 1 kit by mouth once for 1 dose. 354 mL 0   No current facility-administered medications for this visit.    Allergies as of 04/16/2020  . (No  Known Allergies)    Review of Systems:    All systems reviewed and negative except where noted in HPI.   Observations/Objective:  Labs: CMP     Component Value Date/Time   NA 135 02/28/2020 0905   NA 136 07/01/2015 1133   K 4.2 02/28/2020 0905   CL 99 02/28/2020 0905   CO2 27 02/28/2020 0905   GLUCOSE 93 02/28/2020 0905   BUN 22 02/28/2020 0905   BUN 22 07/01/2015 1133   CREATININE 1.09 (H) 02/28/2020 0905   CALCIUM 9.5 02/28/2020 0905   PROT 6.9 02/28/2020 0905   PROT 8.0 07/01/2015 1133   ALBUMIN 3.5 09/10/2018 0125   ALBUMIN 3.9 07/01/2015 1133   AST 14 02/28/2020 0905    ALT 6 02/28/2020 0905   ALKPHOS 108 09/10/2018 0125   BILITOT 0.2 02/28/2020 0905   BILITOT 0.4 07/01/2015 1133   GFRNONAA 50 (L) 02/28/2020 0905   GFRAA 58 (L) 02/28/2020 0905   Lab Results  Component Value Date   WBC 8.4 02/28/2020   HGB 9.9 (L) 02/28/2020   HCT 30.4 (L) 02/28/2020   MCV 91.3 02/28/2020   PLT 434 (H) 02/28/2020    Imaging Studies: No results found.  Assessment and Plan:   ZENNIE AYARS is a 75 y.o. y/o female here for follow-up of iron deficiency anemia  Assessment and Plan: Patient cannot be scheduled for EGD and colonoscopy for further evaluation She has also been seen by her cardiologist recently  I have discussed alternative options, risks & benefits,  which include, but are not limited to, bleeding, infection, perforation,respiratory complication & drug reaction.  The patient agrees with this plan & written consent will be obtained.      Follow Up Instructions:    I discussed the assessment and treatment plan with the patient. The patient was provided an opportunity to ask questions and all were answered. The patient agreed with the plan and demonstrated an understanding of the instructions.   The patient was advised to call back or seek an in-person evaluation if the symptoms worsen or if the condition fails to improve as anticipated.  I provided 15 minutes of non-face-to-face time during this encounter. Additional time was spent in reviewing patient's chart, placing orders etc.   Virgel Manifold, MD  Speech recognition software was used to dictate this note.

## 2020-04-17 ENCOUNTER — Telehealth: Payer: Self-pay | Admitting: Gastroenterology

## 2020-04-17 NOTE — Telephone Encounter (Signed)
Patient confused about instructions before her procedure scheduled on 04/23/20.  Please call to review

## 2020-04-18 MED ORDER — NA SULFATE-K SULFATE-MG SULF 17.5-3.13-1.6 GM/177ML PO SOLN: ORAL | 0 refills | Status: DC

## 2020-04-18 NOTE — Telephone Encounter (Signed)
Called patient to let her know that I will explain the instructions to her if she comes to the office. Patient stated that she would come in so I could explain what she needs to do. Patient also stated that her physician had called her to let her know that she is to stop her Eliquis 2 days prior and to restart once Dr. Bonna Gains told her if she had polyps removed to start when she recommended for her to restart but if she didn't to start right away.

## 2020-04-21 ENCOUNTER — Other Ambulatory Visit
Admission: RE | Admit: 2020-04-21 | Discharge: 2020-04-21 | Disposition: A | Discharge status: Home / Self Care | Payer: PPO | Source: Ambulatory Visit | Attending: Gastroenterology | Admitting: Gastroenterology

## 2020-04-21 ENCOUNTER — Other Ambulatory Visit: Payer: Self-pay

## 2020-04-21 DIAGNOSIS — Z01812 Encounter for preprocedural laboratory examination: Secondary | ICD-10-CM | POA: Insufficient documentation

## 2020-04-21 DIAGNOSIS — Z20822 Contact with and (suspected) exposure to covid-19: Secondary | ICD-10-CM | POA: Diagnosis not present

## 2020-04-21 LAB — SARS CORONAVIRUS 2 (TAT 6-24 HRS): SARS Coronavirus 2: NEGATIVE

## 2020-04-22 ENCOUNTER — Encounter: Payer: Self-pay | Admitting: Gastroenterology

## 2020-04-22 MED ORDER — SODIUM CHLORIDE 0.9 % IV SOLN: INTRAVENOUS | Status: DC

## 2020-04-23 ENCOUNTER — Encounter
Admission: RE | Disposition: A | Discharge status: Home / Self Care | Payer: Self-pay | Source: Ambulatory Visit | Attending: Gastroenterology

## 2020-04-23 ENCOUNTER — Ambulatory Visit
Admission: RE | Admit: 2020-04-23 | Discharge: 2020-04-23 | Disposition: A | Discharge status: Home / Self Care | Payer: PPO | Source: Ambulatory Visit | Attending: Gastroenterology | Admitting: Gastroenterology

## 2020-04-23 ENCOUNTER — Encounter: Payer: Self-pay | Admitting: Gastroenterology

## 2020-04-23 ENCOUNTER — Ambulatory Visit: Payer: PPO | Admitting: Anesthesiology

## 2020-04-23 ENCOUNTER — Telehealth: Payer: Self-pay | Admitting: Gastroenterology

## 2020-04-23 ENCOUNTER — Telehealth: Payer: PPO | Admitting: Gastroenterology

## 2020-04-23 ENCOUNTER — Other Ambulatory Visit: Payer: Self-pay

## 2020-04-23 DIAGNOSIS — K2100 Gastro-esophageal reflux disease with esophagitis, without bleeding: Secondary | ICD-10-CM | POA: Insufficient documentation

## 2020-04-23 DIAGNOSIS — Z86711 Personal history of pulmonary embolism: Secondary | ICD-10-CM | POA: Insufficient documentation

## 2020-04-23 DIAGNOSIS — D120 Benign neoplasm of cecum: Secondary | ICD-10-CM | POA: Diagnosis not present

## 2020-04-23 DIAGNOSIS — D124 Benign neoplasm of descending colon: Secondary | ICD-10-CM | POA: Diagnosis not present

## 2020-04-23 DIAGNOSIS — D509 Iron deficiency anemia, unspecified: Secondary | ICD-10-CM

## 2020-04-23 DIAGNOSIS — D123 Benign neoplasm of transverse colon: Secondary | ICD-10-CM | POA: Insufficient documentation

## 2020-04-23 DIAGNOSIS — N183 Chronic kidney disease, stage 3 unspecified: Secondary | ICD-10-CM | POA: Diagnosis not present

## 2020-04-23 DIAGNOSIS — E785 Hyperlipidemia, unspecified: Secondary | ICD-10-CM | POA: Diagnosis not present

## 2020-04-23 DIAGNOSIS — B9681 Helicobacter pylori [H. pylori] as the cause of diseases classified elsewhere: Secondary | ICD-10-CM | POA: Insufficient documentation

## 2020-04-23 DIAGNOSIS — Z7951 Long term (current) use of inhaled steroids: Secondary | ICD-10-CM | POA: Insufficient documentation

## 2020-04-23 DIAGNOSIS — Z1211 Encounter for screening for malignant neoplasm of colon: Secondary | ICD-10-CM | POA: Insufficient documentation

## 2020-04-23 DIAGNOSIS — K6389 Other specified diseases of intestine: Secondary | ICD-10-CM | POA: Insufficient documentation

## 2020-04-23 DIAGNOSIS — K2289 Other specified disease of esophagus: Secondary | ICD-10-CM

## 2020-04-23 DIAGNOSIS — K2950 Unspecified chronic gastritis without bleeding: Secondary | ICD-10-CM | POA: Insufficient documentation

## 2020-04-23 DIAGNOSIS — Z87891 Personal history of nicotine dependence: Secondary | ICD-10-CM | POA: Diagnosis not present

## 2020-04-23 DIAGNOSIS — Z79899 Other long term (current) drug therapy: Secondary | ICD-10-CM | POA: Insufficient documentation

## 2020-04-23 DIAGNOSIS — K3189 Other diseases of stomach and duodenum: Secondary | ICD-10-CM | POA: Diagnosis not present

## 2020-04-23 DIAGNOSIS — K635 Polyp of colon: Secondary | ICD-10-CM | POA: Diagnosis not present

## 2020-04-23 DIAGNOSIS — K219 Gastro-esophageal reflux disease without esophagitis: Secondary | ICD-10-CM | POA: Diagnosis not present

## 2020-04-23 DIAGNOSIS — I129 Hypertensive chronic kidney disease with stage 1 through stage 4 chronic kidney disease, or unspecified chronic kidney disease: Secondary | ICD-10-CM | POA: Diagnosis not present

## 2020-04-23 DIAGNOSIS — K449 Diaphragmatic hernia without obstruction or gangrene: Secondary | ICD-10-CM

## 2020-04-23 DIAGNOSIS — E1122 Type 2 diabetes mellitus with diabetic chronic kidney disease: Secondary | ICD-10-CM | POA: Diagnosis not present

## 2020-04-23 DIAGNOSIS — K31819 Angiodysplasia of stomach and duodenum without bleeding: Secondary | ICD-10-CM

## 2020-04-23 DIAGNOSIS — J449 Chronic obstructive pulmonary disease, unspecified: Secondary | ICD-10-CM | POA: Diagnosis not present

## 2020-04-23 HISTORY — PX: ESOPHAGOGASTRODUODENOSCOPY (EGD) WITH PROPOFOL: SHX5813

## 2020-04-23 HISTORY — PX: COLONOSCOPY WITH PROPOFOL: SHX5780

## 2020-04-23 LAB — HM COLONOSCOPY

## 2020-04-23 SURGERY — COLONOSCOPY WITH PROPOFOL
Anesthesia: General

## 2020-04-23 MED ORDER — PROPOFOL 10 MG/ML IV BOLUS
INTRAVENOUS | Status: DC | PRN
  Administered 2020-04-23: 20 mg via INTRAVENOUS
  Administered 2020-04-23: 60 mg via INTRAVENOUS

## 2020-04-23 MED ORDER — LIDOCAINE HCL (CARDIAC) PF 100 MG/5ML IV SOSY
PREFILLED_SYRINGE | INTRAVENOUS | Status: DC | PRN
  Administered 2020-04-23: 50 mg via INTRAVENOUS

## 2020-04-23 MED ORDER — LIDOCAINE HCL (PF) 2 % IJ SOLN
INTRAMUSCULAR | Status: AC
  Filled 2020-04-23: qty 5

## 2020-04-23 MED ORDER — PHENYLEPHRINE HCL (PRESSORS) 10 MG/ML IV SOLN
INTRAVENOUS | Status: DC | PRN
  Administered 2020-04-23: 100 ug via INTRAVENOUS

## 2020-04-23 MED ORDER — PROPOFOL 500 MG/50ML IV EMUL
INTRAVENOUS | Status: DC | PRN
  Administered 2020-04-23: 150 ug/kg/min via INTRAVENOUS

## 2020-04-23 MED ORDER — PROPOFOL 500 MG/50ML IV EMUL
INTRAVENOUS | Status: AC
  Filled 2020-04-23: qty 100

## 2020-04-23 NOTE — Telephone Encounter (Signed)
Patient lvm that she doesn't understand her post procedure instructions and to please call her.

## 2020-04-23 NOTE — Op Note (Addendum)
[facility] Gastroenterology Patient Name: Colleen [name] Procedure Date: 04/23/2020 9:11 AM MRN: [id number] Account #: [id number] Date of Birth: [birth year] Admit Type: Outpatient Age: 75 Room: [facility] ENDO ROOM 3 Gender: Female Note Status: Finalized Procedure:             Colonoscopy Indications:           Screening for colorectal malignant neoplasm Providers:             Maclovio Henson B. Bonna Gains MD, MD Medicines:             Monitored Anesthesia Care Complications:         No immediate complications. Procedure:             Pre-Anesthesia Assessment:                        - ASA Grade Assessment: II - A patient with mild                         systemic disease.                        - Prior to the procedure, a History and Physical was                         performed, and patient medications, allergies and                         sensitivities were reviewed. The patient's tolerance                         of previous anesthesia was reviewed.                        - The risks and benefits of the procedure and the                         sedation options and risks were discussed with the                         patient. All questions were answered and informed                         consent was obtained.                        - Patient identification and proposed procedure were                         verified prior to the procedure by the physician, the                         nurse, the anesthesiologist, the anesthetist and the                         technician. The procedure was verified in the                         procedure room.  After obtaining informed consent, the colonoscope was                         passed under direct vision. Throughout the procedure,                         the patient's blood pressure, pulse, and oxygen                         saturations were monitored continuously. The                         Colonoscope  was introduced through the anus and                         advanced to the the terminal ileum. The colonoscopy                         was performed with ease. The patient tolerated the                         procedure well. The quality of the bowel preparation                         was good. Findings:      The perianal and digital rectal examinations were normal.      A 4 mm polyp was found in the cecum. The polyp was flat. The polyp was       removed with a cold biopsy forceps. Resection and retrieval were       complete.      Two sessile polyps were found in the descending colon and transverse       colon. The polyps were 5 to 7 mm in size. These polyps were removed with       a cold snare. Resection and retrieval were complete.      The exam was otherwise without abnormality.      The rectum, sigmoid colon, descending colon, transverse colon, ascending       colon, cecum and ileum appeared normal.      The retroflexed view of the distal rectum and anal verge was normal and       showed no anal or rectal abnormalities. Impression:            - One 4 mm polyp in the cecum, removed with a cold                         biopsy forceps. Resected and retrieved.                        - Two 5 to 7 mm polyps in the descending colon and in                         the transverse colon, removed with a cold snare.                         Resected and retrieved.                        -  The examination was otherwise normal.                        - The rectum, sigmoid colon, descending colon,                         transverse colon, ascending colon and cecum are normal.                        - The distal rectum and anal verge are normal on                         retroflexion view. Recommendation:        - Await pathology results.                        - If iron deficiency anemia improves after the                         treatment of the AVMs seen during upper endoscopy                          today, no need for small bowel capsule study. If iron                         deficiency anemia continues, proceed with Small bowel                         capsule study. If small bowel capsule study is done we                         may need to consider placing the capsule                         endoscopically to prevent it from spending prolonged                         time in the para-esophageal hernia                        - Follow up in GI clinic in 4-6 weeks to reassess.                        - Discharge patient to home (with escort).                        - Advance diet as tolerated.                        - Continue present medications.                        - Repeat colonoscopy date to be determined after                         pending pathology results are reviewed.                        - The findings and recommendations were  discussed with                         the patient.                        - The findings and recommendations were discussed with                         the patient's family.                        - Return to primary care physician as previously                         scheduled. Procedure Code(s):     --- Professional ---                        [phone], Colonoscopy, flexible; with removal of                         tumor(s), polyp(s), or other lesion(s) by snare                         technique                        45380, 40, Colonoscopy, flexible; with biopsy, single                         or multiple Diagnosis Code(s):     --- Professional ---                        K63.5, Polyp of colon                        Z12.11, Encounter for screening for malignant neoplasm                         of colon CPT copyright 2019 [employer]. All rights reserved. The codes documented in this report are preliminary and upon coder review may  be revised to meet current compliance requirements.  Vonda Antigua, MD Margretta Sidle B.  Bonna Gains MD, MD 04/23/2020 10:26:18 AM This report has been signed electronically. Number of Addenda: 0 Note Initiated On: 04/23/2020 9:11 AM Scope Withdrawal Time: 0 hours 15 minutes 24 seconds  Total Procedure Duration: 0 hours 20 minutes 53 seconds  Estimated Blood Loss:  Estimated blood loss: none.      [facility]

## 2020-04-23 NOTE — Anesthesia Preprocedure Evaluation (Signed)
Anesthesia Evaluation  Patient identified by MRN, date of birth, ID band Patient awake    Reviewed: Allergy & Precautions, NPO status , Patient's Chart, lab work & pertinent test results  Airway Mallampati: III       Dental   Pulmonary asthma , COPD,  COPD inhaler, former smoker, PE          Cardiovascular hypertension, + DOE       Neuro/Psych negative neurological ROS  negative psych ROS   GI/Hepatic GERD  ,  Endo/Other  diabetes  Renal/GU Renal disease  negative genitourinary   Musculoskeletal  (+) Arthritis , Osteoarthritis,    Abdominal   Peds negative pediatric ROS (+)  Hematology  (+) anemia ,   Anesthesia Other Findings Past Medical History: No date: Asthma No date: Cataract No date: COPD (chronic obstructive pulmonary disease) (HCC) No date: GERD (gastroesophageal reflux disease) No date: Gout No date: History of pulmonary embolus (PE) No date: Hyperlipidemia No date: Hypertension No date: Osteoarthrosis No date: Prediabetes  Reproductive/Obstetrics                             Anesthesia Physical Anesthesia Plan  ASA: III  Anesthesia Plan: General   Post-op Pain Management:    Induction: Intravenous  PONV Risk Score and Plan: Propofol infusion  Airway Management Planned: Nasal Cannula  Additional Equipment:   Intra-op Plan:   Post-operative Plan:   Informed Consent: I have reviewed the patients History and Physical, chart, labs and discussed the procedure including the risks, benefits and alternatives for the proposed anesthesia with the patient or authorized representative who has indicated his/her understanding and acceptance.     Dental advisory given  Plan Discussed with: CRNA and Surgeon  Anesthesia Plan Comments:         Anesthesia Quick Evaluation

## 2020-04-23 NOTE — H&P (Signed)
Vonda Antigua, MD [address] [place], [place], [place], [address] [address], [place], [place], [place], [address] Phone: [phone]  Fax: [phone]  Primary Care Physician:  Delsa Grana, PA-C   Pre-Procedure History & Physical: HPI:  Colleen [name] is a 75 y.o. female is here for a colonoscopy and EGD.   Past Medical History:  Diagnosis Date  . Asthma   . Cataract   . COPD (chronic obstructive pulmonary disease) ([place])   . GERD (gastroesophageal reflux disease)   . Gout   . History of pulmonary embolus (PE)   . Hyperlipidemia   . Hypertension   . Osteoarthrosis   . Prediabetes     Past Surgical History:  Procedure Laterality Date  . BREAST EXCISIONAL BIOPSY Left 04/08/2014   Intraductal papilloma-5 mm  . LUMBAR LAMINECTOMY    . TONSILLECTOMY      Prior to Admission medications   Medication Sig Start Date End Date Taking? Authorizing Provider  allopurinol (ZYLOPRIM) 100 MG tablet Take 2 tablets (200 mg total) by mouth daily. 06/12/19  Yes Delsa Grana, PA-C  cholecalciferol (VITAMIN D3) 25 MCG (1000 UNIT) tablet Take 1,000 Units by mouth daily.   Yes [provider]  enalapril (VASOTEC) 10 MG tablet Take 1 tablet (10 mg total) by mouth daily. 02/28/20  Yes Delsa Grana, PA-C  famotidine (PEPCID) 20 MG tablet Take 20 mg by mouth daily as needed for heartburn or indigestion.   Yes [provider]  ferrous sulfate 325 (65 FE) MG EC tablet Take 1 tablet (325 mg total) by mouth daily. 03/14/19  Yes Delsa Grana, PA-C  lovastatin (MEVACOR) 40 MG tablet Take 1 tablet (40 mg total) by mouth at bedtime. 02/28/20  Yes Delsa Grana, PA-C  Na Sulfate-K Sulfate-Mg Sulf 17.5-3.13-1.6 GM/177ML SOLN At 5 PM the day before procedure take 1 bottle and 5 hours before procedure take 1 bottle. 04/18/20  Yes Virgel Manifold, MD  PROAIR HFA 108 [phone] Base) MCG/ACT inhaler INHALE 2 PUFFS INTO LUNGS EVERY 4 HOURS AS NEEDED FOR WHEEZING FOR SHORTNESS OF BREATH 02/28/20   Yes Delsa Grana, PA-C  SYMBICORT 160-4.5 MCG/ACT inhaler Inhale 2 puffs into the lungs 2 (two) times daily. 02/28/20  Yes Delsa Grana, PA-C  Blood Pressure Monitoring (BLOOD PRESSURE KIT) DEVI 1 Device by Does not apply route daily. Fluctuating blood pressure; please get appropriate size, large cuff 02/03/18   Lada, Satira Anis, MD  ELIQUIS 5 MG TABS tablet Take 5 mg by mouth 2 (two) times daily. 04/21/19   [provider]  hydrochlorothiazide (MICROZIDE) 12.5 MG capsule Take 1 capsule by mouth once daily 09/24/19   Delsa Grana, PA-C  simethicone (GAS-X) 80 MG chewable tablet Chew 1 tablet (80 mg total) by mouth 2 (two) times a day. 08/02/18 06/17/28  Harvest Dark, MD  triamcinolone (KENALOG) 0.1 % APPLY CREAM EXTERNALLY TO AFFECTED AREA TWICE DAILY if needed; not more than one week at a time, then take a break for two weeks 02/28/20   Delsa Grana, PA-C    Allergies as of 04/17/2020  . (No Known Allergies)    Family History  Problem Relation Age of Onset  . Diabetes Mother   . Lung cancer Father   . Diabetes Sister   . Congestive Heart Failure Sister   . Glaucoma Sister   . Breast cancer Neg Hx     Social History   Socioeconomic History  . Marital status: Widowed    Spouse name: Titus Dubin  . Number  of children: 0  . Years of education: Not on file  . Highest education level: 12th grade  Occupational History  . Occupation: Retired  Tobacco Use  . Smoking status: Former Smoker    Packs/day: 1.50    Years: 20.00    Pack years: 30.00    Types: Cigarettes    Quit date: 11/13/2008    Years since quitting: 11.4  . Smokeless tobacco: Never Used  . Tobacco comment: smoking cessation materials not required  Vaping Use  . Vaping Use: Never used  Substance and Sexual Activity  . Alcohol use: Not Currently    Alcohol/week: 1.0 standard drink    Types: 1 Cans of beer per week  . Drug use: No  . Sexual activity: Not Currently  Other Topics Concern  . Not on file  Social  History Narrative   Pt lives alone   Social Determinants of Health   Financial Resource Strain: Low Risk   . Difficulty of Paying Living Expenses: Not hard at all  Food Insecurity: No Food Insecurity  . Worried About Charity fundraiser in the Last Year: Never true  . Ran Out of Food in the Last Year: Never true  Transportation Needs: No Transportation Needs  . Lack of Transportation (Medical): No  . Lack of Transportation (Non-Medical): No  Physical Activity: Inactive  . Days of Exercise per Week: 0 days  . Minutes of Exercise per Session: 0 min  Stress: No Stress Concern Present  . Feeling of Stress : Not at all  Social Connections: Moderately Isolated  . Frequency of Communication with Friends and Family: More than three times a week  . Frequency of Social Gatherings with Friends and Family: Twice a week  . Attends Religious Services: More than 4 times per year  . Active Member of Clubs or Organizations: No  . Attends Archivist Meetings: Never  . Marital Status: Widowed  Intimate Partner Violence: Not At Risk  . Fear of Current or Ex-Partner: No  . Emotionally Abused: No  . Physically Abused: No  . Sexually Abused: No    Review of Systems: See HPI, otherwise negative ROS  Physical Exam: BP (!) 102/55   Pulse 91   Temp (!) 97.1 F (36.2 C) (Temporal)   Resp 20   Ht '5\' 8"'  (1.727 m)   Wt 124.3 kg   SpO2 100%   BMI 41.66 kg/m  General:   Alert,  pleasant and cooperative in NAD Head:  Normocephalic and atraumatic. Neck:  Supple; no masses or thyromegaly. Lungs:  Clear throughout to auscultation, normal respiratory effort.    Heart:  +S1, +S2, Regular rate and rhythm, No edema. Abdomen:  Soft, nontender and nondistended. Normal bowel sounds, without guarding, and without rebound.   Neurologic:  Alert and  oriented x4;  grossly normal neurologically.  Impression/Plan: Colleen [name] is here for a colonoscopy and EGD for iron deficiency anemia.  Risks,  benefits, limitations, and alternatives regarding the procedures have been reviewed with the patient.  Questions have been answered.  All parties agreeable.   Virgel Manifold, MD  04/23/2020, 9:12 AM

## 2020-04-23 NOTE — Op Note (Addendum)
[facility] Gastroenterology Patient Name: Colleen [name] Procedure Date: 04/23/2020 9:11 AM MRN: [id number] Account #: [id number] Date of Birth: 03-Oct-1945 Admit Type: Outpatient Age: 75 Room: [facility] ENDO ROOM 3 Gender: Female Note Status: Finalized Procedure:             Upper GI endoscopy Indications:           Iron deficiency anemia Providers:             Tonye Tancredi B. Bonna Gains MD, MD Medicines:             Monitored Anesthesia Care Complications:         No immediate complications. Procedure:             Pre-Anesthesia Assessment:                        - The risks and benefits of the procedure and the                         sedation options and risks were discussed with the                         patient. All questions were answered and informed                         consent was obtained.                        - Patient identification and proposed procedure were                         verified prior to the procedure.                        - ASA Grade Assessment: II - A patient with mild                         systemic disease.                        After obtaining informed consent, the endoscope was                         passed under direct vision. Throughout the procedure,                         the patient's blood pressure, pulse, and oxygen                         saturations were monitored continuously. The Endoscope                         was introduced through the mouth, and advanced to the                         second part of duodenum. The upper GI endoscopy was                         accomplished with ease. The patient tolerated the  procedure well. Findings:      Scattered islands of salmon-colored mucosa were present. No other       visible abnormalities were present. The maximum longitudinal extent of       these esophageal mucosal changes was 1 cm in length. Mucosa was biopsied       with a cold forceps for  histology. One specimen bottle was sent to       pathology.      The exam of the esophagus was otherwise normal.      A 5 cm paraesophageal hernia was found. The proximal extent of the       gastric folds (end of tubular esophagus) was 36 cm from the incisors.       The hiatal narrowing was 41 cm from the incisors. The Z-line was 36 cm       from the incisors.      A single 5 mm angioectasia with no bleeding was found in the gastric       fundus. Coagulation for bleeding prevention using argon plasma was       successful.      A single 5 mm mucosal papule (nodule) with no bleeding and no stigmata       of recent bleeding was found in the gastric antrum. Biopsies were taken       with a cold forceps for histology.      Lymphangiectasia was present in the gastric antrum.      The exam of the stomach was otherwise normal.      Two 4 mm angioectasias with bleeding on contact were found in the third       portion of the duodenum. Coagulation for hemostasis using argon plasma       was successful.      The exam of the duodenum was otherwise normal. Impression:            - Salmon-colored mucosa suspicious for short-segment                         Barrett's esophagus. Biopsied.                        - 5 cm paraesophageal hernia.                        - A single non-bleeding angioectasia in the stomach.                         Treated with argon plasma coagulation (APC).                        - A single mucosal papule (nodule) found in the                         stomach. Biopsied.                        - Gastric mucosal lymphangiectasia.                        - Two angioectasias in the duodenum. Treated with                         argon plasma coagulation (APC). Recommendation:        -  Await pathology results.                        - Refer to a surgeon [place] [facility] for                         paraesophageal hernia.                        - Discharge patient to home.                         - Continue present medications.                        - Resume previous diet.                        - The findings and recommendations were discussed with                         the patient.                        - The findings and recommendations were discussed with                         the patient's family.                        - Return to primary care physician as previously                         scheduled. Procedure Code(s):     --- Professional ---                        [id number], 50, Esophagogastroduodenoscopy, flexible,                         transoral; with control of bleeding, any method                        43239, Esophagogastroduodenoscopy, flexible,                         transoral; with biopsy, single or multiple Diagnosis Code(s):     --- Professional ---                        K22.8, Other specified diseases of esophagus                        K44.9, Diaphragmatic hernia without obstruction or                         gangrene                        K31.819, Angiodysplasia of stomach and duodenum                         without bleeding  K31.89, Other diseases of stomach and duodenum                        D50.9, Iron deficiency anemia, unspecified CPT copyright 2019 American Medical Association. All rights reserved. The codes documented in this report are preliminary and upon coder review may  be revised to meet current compliance requirements.  Vonda Antigua, MD Margretta Sidle B. Bonna Gains MD, MD 04/23/2020 9:48:57 AM This report has been signed electronically. Number of Addenda: 0 Note Initiated On: 04/23/2020 9:11 AM Estimated Blood Loss:  Estimated blood loss: none.      [facility]

## 2020-04-23 NOTE — Transfer of Care (Signed)
Immediate Anesthesia Transfer of Care Note  Patient: Colleen [name]  Procedure(s) Performed: COLONOSCOPY WITH PROPOFOL (N/A ) ESOPHAGOGASTRODUODENOSCOPY (EGD) WITH PROPOFOL (N/A )  Patient Location: PACU  Anesthesia Type:General  Level of Consciousness: awake and drowsy  Airway & Oxygen Therapy: Patient Spontanous Breathing  Post-op Assessment: Report given to RN and Post -op Vital signs reviewed and stable  Post vital signs: Reviewed and stable  Last Vitals:  Vitals Value Taken Time  BP 128/56 04/23/20 1019  Temp    Pulse 86 04/23/20 1019  Resp 18 04/23/20 1019  SpO2 99 % 04/23/20 1019    Last Pain:  Vitals:   04/23/20 1010  TempSrc: Temporal  PainSc:          Complications: No complications documented.

## 2020-04-24 ENCOUNTER — Other Ambulatory Visit: Payer: Self-pay | Admitting: Gastroenterology

## 2020-04-24 ENCOUNTER — Telehealth: Payer: Self-pay

## 2020-04-24 ENCOUNTER — Telehealth: Payer: Self-pay | Admitting: Family Medicine

## 2020-04-24 ENCOUNTER — Encounter: Payer: Self-pay | Admitting: Gastroenterology

## 2020-04-24 LAB — SURGICAL PATHOLOGY

## 2020-04-24 MED ORDER — AMOXICILLIN 500 MG PO TABS: 1000.0000 mg | ORAL_TABLET | Freq: Two times a day (BID) | ORAL | 0 refills | Status: AC

## 2020-04-24 MED ORDER — CLARITHROMYCIN 500 MG PO TABS: 500.0000 mg | ORAL_TABLET | Freq: Two times a day (BID) | ORAL | 0 refills | Status: AC

## 2020-04-24 MED ORDER — OMEPRAZOLE 20 MG PO CPDR: 20.0000 mg | DELAYED_RELEASE_CAPSULE | Freq: Two times a day (BID) | ORAL | 0 refills | Status: DC

## 2020-04-24 NOTE — Progress Notes (Signed)
Pt. Upset because the insurance dept. Lost her Insurance pre payment.They have since found it .

## 2020-04-24 NOTE — Telephone Encounter (Signed)
Called pharmacy and let them know what her PCP and Dr. Bonna Gains both agreed on. Patient was called then and it was explained what to do.I then called patient to let her know what the instructions were and she understood and had no further questions.

## 2020-04-24 NOTE — Telephone Encounter (Signed)
Pt called and stated that the provider that did colonoscopy / GI endoscopy sent her 3 medications and asked her to stop taking her Lovastatin for 14 day as well as to stop taking her Triamcinolone cream for 14 days/ pt was advised to call her PCP to see what can be taken or done in place of her Triamcinolone cream/ Pt stated she can not stop using the cream or her face will be burning and  breakout badly / please advise

## 2020-04-24 NOTE — Telephone Encounter (Signed)
Patient was contacted with her instructions and after explaining, she then had no further questions.

## 2020-04-24 NOTE — Anesthesia Postprocedure Evaluation (Signed)
Anesthesia Post Note  Patient: Colleen [name]  Procedure(s) Performed: COLONOSCOPY WITH PROPOFOL (N/A ) ESOPHAGOGASTRODUODENOSCOPY (EGD) WITH PROPOFOL (N/A )  Patient location during evaluation: Endoscopy Anesthesia Type: General Level of consciousness: awake and alert and oriented Pain management: pain level controlled Vital Signs Assessment: post-procedure vital signs reviewed and stable Respiratory status: spontaneous breathing Cardiovascular status: blood pressure returned to baseline Anesthetic complications: no   No complications documented.   Last Vitals:  Vitals:   04/23/20 1040 04/23/20 1050  BP: 125/72 (!) 163/90  Pulse: 88 80  Resp: 17 (!) 24  Temp:    SpO2: 100% 100%    Last Pain:  Vitals:   04/24/20 0728  TempSrc:   PainSc: 0-No pain                 Mysha Peeler

## 2020-04-24 NOTE — Telephone Encounter (Signed)
Of course, sounds good.  thanks

## 2020-04-24 NOTE — Telephone Encounter (Signed)
The pharmacist called stating that clarithromycin is contradicting with Lovastatin. Is it okay for patient to take? Please advise so I could call the pharmacist back. Thank you.

## 2020-04-25 ENCOUNTER — Telehealth: Payer: Self-pay

## 2020-04-25 DIAGNOSIS — K449 Diaphragmatic hernia without obstruction or gangrene: Secondary | ICD-10-CM

## 2020-04-25 NOTE — Telephone Encounter (Signed)
Referral was sent to [facility] surgery.

## 2020-04-25 NOTE — Addendum Note (Signed)
Addended by: Wayna Chalet on: 04/25/2020 11:08 AM   Modules accepted: Orders

## 2020-04-25 NOTE — Telephone Encounter (Signed)
-----   Message from Virgel Manifold, MD sent at 04/23/2020 11:16 AM EDT ----- Please refer to [place] [facility] for large paraesophageal hernia

## 2020-04-25 NOTE — Telephone Encounter (Signed)
Colleen Manifold, MD  Wayna Chalet, [place] please let the patient know, the biopsies from her stomach showed H. pylori. I have sent 2 antibiotics and omeprazole to her pharmacy. This is a 14-day course of treatment. During these 14 days, she should hold her lovastatin and triamcinolone cream. After completion of the medications she can resume the lovastatin and triamcinolone. Please document that explained this to her and that she verbalized understanding of the instructions. Please ensure patient has clinic follow-up in 6 to 8 weeks and we can obtain H. pylori breath test at that time for eradication testing

## 2020-04-25 NOTE — Telephone Encounter (Signed)
Triedto call patient x2 no vm setup

## 2020-04-25 NOTE — Telephone Encounter (Signed)
?   Why would she even need to stop this? because its a steriod?

## 2020-05-29 DIAGNOSIS — I4891 Unspecified atrial fibrillation: Secondary | ICD-10-CM | POA: Diagnosis not present

## 2020-05-29 DIAGNOSIS — J449 Chronic obstructive pulmonary disease, unspecified: Secondary | ICD-10-CM | POA: Diagnosis not present

## 2020-05-29 DIAGNOSIS — K449 Diaphragmatic hernia without obstruction or gangrene: Secondary | ICD-10-CM | POA: Diagnosis not present

## 2020-05-29 DIAGNOSIS — Z9889 Other specified postprocedural states: Secondary | ICD-10-CM | POA: Diagnosis not present

## 2020-05-29 DIAGNOSIS — Z86711 Personal history of pulmonary embolism: Secondary | ICD-10-CM | POA: Diagnosis not present

## 2020-05-29 DIAGNOSIS — K219 Gastro-esophageal reflux disease without esophagitis: Secondary | ICD-10-CM | POA: Diagnosis not present

## 2020-06-02 ENCOUNTER — Other Ambulatory Visit: Payer: Self-pay | Admitting: Surgery

## 2020-06-02 DIAGNOSIS — Z9889 Other specified postprocedural states: Secondary | ICD-10-CM

## 2020-06-04 ENCOUNTER — Other Ambulatory Visit: Payer: Self-pay | Admitting: Surgery

## 2020-06-04 DIAGNOSIS — Z9889 Other specified postprocedural states: Secondary | ICD-10-CM

## 2020-06-13 ENCOUNTER — Other Ambulatory Visit: Payer: Self-pay

## 2020-06-13 ENCOUNTER — Ambulatory Visit
Admission: RE | Admit: 2020-06-13 | Discharge: 2020-06-13 | Disposition: A | Discharge status: Home / Self Care | Payer: PPO | Source: Ambulatory Visit | Attending: Surgery | Admitting: Surgery

## 2020-06-13 DIAGNOSIS — K219 Gastro-esophageal reflux disease without esophagitis: Secondary | ICD-10-CM | POA: Diagnosis not present

## 2020-06-13 DIAGNOSIS — Z9889 Other specified postprocedural states: Secondary | ICD-10-CM | POA: Insufficient documentation

## 2020-06-25 ENCOUNTER — Encounter: Payer: Self-pay | Admitting: Surgery

## 2020-06-25 DIAGNOSIS — K219 Gastro-esophageal reflux disease without esophagitis: Secondary | ICD-10-CM | POA: Diagnosis not present

## 2020-06-25 DIAGNOSIS — Z9889 Other specified postprocedural states: Secondary | ICD-10-CM | POA: Insufficient documentation

## 2020-06-30 DIAGNOSIS — D509 Iron deficiency anemia, unspecified: Secondary | ICD-10-CM | POA: Diagnosis not present

## 2020-07-14 ENCOUNTER — Telehealth: Payer: Self-pay | Admitting: *Deleted

## 2020-07-14 NOTE — Chronic Care Management (AMB) (Signed)
  Chronic Care Management   Note  07/14/2020 Name: Colleen [name] MRN: [id number] DOB: 1945/10/09  [name] is a 75 y.o. year old female who is a primary care patient of Delsa Grana, [place]. I reached out to Darylene Price by phone today in response to a referral sent by [name]'s PCP, Delsa Grana, PA-C.      [name] was given information about Chronic Care Management services today including:  CCM service includes personalized support from designated clinical staff supervised by her physician, including individualized plan of care and coordination with other care providers 24/7 contact phone numbers for assistance for urgent and routine care needs. Service will only be billed when office clinical staff spend 20 minutes or more in a month to coordinate care. Only one practitioner may furnish and bill the service in a calendar month. The patient may stop CCM services at any time (effective at the end of the month) by phone call to the office staff. The patient will be responsible for cost sharing (co-pay) of up to 20% of the service fee (after annual deductible is met).  Patient agreed to services and verbal consent obtained.   Follow up plan: Telephone appointment with care management team member scheduled for:07/17/2020  [facility]

## 2020-07-17 ENCOUNTER — Ambulatory Visit: Payer: PPO

## 2020-07-17 DIAGNOSIS — I10 Essential (primary) hypertension: Secondary | ICD-10-CM

## 2020-07-17 DIAGNOSIS — I4891 Unspecified atrial fibrillation: Secondary | ICD-10-CM | POA: Diagnosis not present

## 2020-07-17 DIAGNOSIS — N1831 Chronic kidney disease, stage 3a: Secondary | ICD-10-CM

## 2020-07-17 DIAGNOSIS — J4530 Mild persistent asthma, uncomplicated: Secondary | ICD-10-CM | POA: Diagnosis not present

## 2020-07-17 DIAGNOSIS — M109 Gout, unspecified: Secondary | ICD-10-CM

## 2020-07-17 DIAGNOSIS — K219 Gastro-esophageal reflux disease without esophagitis: Secondary | ICD-10-CM

## 2020-07-17 NOTE — Progress Notes (Signed)
Chronic Care Management Pharmacy Note  07/18/2020 Name:  Colleen [name] MRN:  [id number] DOB:  10-02-1945  Summary: Patient is in good spirits today. She denies affordability concerns with her medications as she is enrolled in the [facility]. She continues to suffer from a rash on her body which she thinks started when she was placed on enalapril a few years prior. She is hesitant to make changes to her blood pressure regimen because her readings have been well controlled on her current medications.   Recommendations/Changes made from today's visit: Could consider switching enalapril to valsartan 80 mg daily to see if rash improves.   Plan: CPP follow-up in 6 months   Subjective: Colleen [name] is an 75 y.o. year old female who is a primary patient of Delsa Grana, [place].  The CCM team was consulted for assistance with disease management and care coordination needs.    Engaged with patient by telephone for initial visit in response to provider referral for pharmacy case management and/or care coordination services.   Consent to Services:  The patient was given the following information about Chronic Care Management services today, agreed to services, and gave verbal consent: 1. CCM service includes personalized support from designated clinical staff supervised by the primary care provider, including individualized plan of care and coordination with other care providers 2. 24/7 contact phone numbers for assistance for urgent and routine care needs. 3. Service will only be billed when office clinical staff spend 20 minutes or more in a month to coordinate care. 4. Only one practitioner may furnish and bill the service in a calendar month. 5.The patient may stop CCM services at any time (effective at the end of the month) by phone call to the office staff. 6. The patient will be responsible for cost sharing (co-pay) of up to 20% of the service fee (after annual deductible is met). Patient  agreed to services and consent obtained.  Patient Care Team: Delsa Grana, PA-C as PCP - General (Family Medicine) Kate Sable, MD as PCP - Cardiology (Cardiology) Inda Castle, MD as Consulting Physician (Rheumatology) Benedetto Goad, RN as Registered Nurse Virgel Manifold, MD as Consulting Physician (Gastroenterology) Germaine Pomfret, [facility] (Pharmacist)  Recent office visits: 02/28/20: Patient presented to Delsa Grana, PA-C for follow-up. No medication changes made.   Recent consult visits: 04/14/20: Patient presented to Dr. Garen Lah (Cardiology). Aspirin stopped.   Hospital visits: 04/23/20: Patient hospitalized for iron deficiency anemia/colonoscopy.  2/21: Patient presented to ED for acute knee pain. Lidocaine 5% patch prescribed.    Objective:  Lab Results  Component Value Date   CREATININE 1.09 (H) 02/28/2020   BUN 22 02/28/2020   GFRNONAA 50 (L) 02/28/2020   GFRAA 58 (L) 02/28/2020   NA 135 02/28/2020   K 4.2 02/28/2020   CALCIUM 9.5 02/28/2020   CO2 27 02/28/2020   GLUCOSE 93 02/28/2020    Lab Results  Component Value Date/Time   HGBA1C 5.4 02/28/2020 09:05 AM   HGBA1C 6.0 (H) 03/06/2019 09:28 AM    Last diabetic Eye exam: No results found for: HMDIABEYEEXA  Last diabetic Foot exam: No results found for: HMDIABFOOTEX   Lab Results  Component Value Date   CHOL 157 02/28/2020   HDL 58 02/28/2020   LDLCALC 80 02/28/2020   TRIG 105 02/28/2020   CHOLHDL 2.7 02/28/2020    Hepatic Function Latest Ref Rng & Units 02/28/2020 06/12/2019 03/06/2019  Total Protein 6.1 - 8.1 g/dL 6.9 7.3 7.6  Albumin 3.5 - 5.0 g/dL - - -  AST 10 - 35 U/L '14 17 19  ' ALT 6 - 29 U/L '6 10 11  ' Alk Phosphatase 38 - 126 U/L - - -  Total Bilirubin 0.2 - 1.2 mg/dL 0.2 0.3 0.3  Bilirubin, Direct 0.1 - 0.5 mg/dL - - -    Lab Results  Component Value Date/Time   TSH 1.62 03/06/2019 09:28 AM   TSH 2.434 09/10/2018 01:25 AM   TSH 3.86 05/23/2017 08:30 AM    CBC Latest Ref  Rng & Units 02/28/2020 03/06/2019 02/10/2019  WBC 3.8 - 10.8 Thousand/uL 8.4 9.1 8.4  Hemoglobin 11.7 - 15.5 g/dL 9.9(L) 9.2(L) 9.1(L)  Hematocrit 35.0 - 45.0 % 30.4(L) 29.5(L) 30.2(L)  Platelets 140 - 400 Thousand/uL 434(H) 488(H) 472(H)    Lab Results  Component Value Date/Time   VD25OH 29 (L) 10/04/2016 09:51 AM    Clinical ASCVD: No  The 10-year ASCVD risk score Mikey Bussing DC Jr., et al., 2013) is: 14.2%   Values used to calculate the score:     Age: 59 years     Sex: Female     Is Non-Hispanic African American: Yes     Diabetic: No     Tobacco smoker: No     Systolic Blood Pressure: 497 mmHg     Is BP treated: Yes     HDL Cholesterol: 58 mg/dL     Total Cholesterol: 157 mg/dL    Depression screen [facility] 2/9 02/28/2020 11/29/2019 10/16/2019  Decreased Interest 0 1 2  Down, Depressed, Hopeless 0 1 1  PHQ - 2 Score 0 2 3  Altered sleeping - 0 0  Tired, decreased energy - 0 0  Change in appetite - 0 0  Feeling bad or failure about yourself  - 0 0  Trouble concentrating - 0 0  Moving slowly or fidgety/restless - 0 0  Suicidal thoughts - 0 0  PHQ-9 Score - 2 3  Difficult doing work/chores - Not difficult at all Not difficult at all  Some recent data might be hidden    Social History   Tobacco Use  Smoking Status Former   Packs/day: 1.50   Years: 20.00   Pack years: 30.00   Types: Cigarettes   Quit date: 11/13/2008   Years since quitting: 11.6  Smokeless Tobacco Never  Tobacco Comments   smoking cessation materials not required   BP Readings from Last 3 Encounters:  04/23/20 (!) 163/90  04/14/20 114/60  02/28/20 115/70   Pulse Readings from Last 3 Encounters:  04/23/20 80  04/14/20 74  02/28/20 94   Wt Readings from Last 3 Encounters:  04/23/20 274 lb (124.3 kg)  04/14/20 273 lb (123.8 kg)  02/28/20 277 lb 9.6 oz (125.9 kg)   BMI Readings from Last 3 Encounters:  04/23/20 41.66 kg/m  04/14/20 44.06 kg/m  02/28/20 46.20 kg/m    Assessment/Interventions:  Review of patient past medical history, allergies, medications, health status, including review of consultants reports, laboratory and other test data, was performed as part of comprehensive evaluation and provision of chronic care management services.   SDOH:  (Social Determinants of Health) assessments and interventions performed: Yes SDOH Interventions    Flowsheet Row Most Recent Value  SDOH Interventions   Financial Strain Interventions Intervention Not Indicated      SDOH Screenings   Alcohol Screen: Low Risk    Last Alcohol Screening Score (AUDIT): 0  Depression (PHQ2-9): Low Risk    PHQ-2 Score: 0  Financial Resource Strain: Low Risk    Difficulty of Paying Living Expenses: Not hard at all  Food Insecurity: No Food Insecurity   Worried About Charity fundraiser in the Last Year: Never true   Arboriculturist in the Last Year: Never true  Housing: Low Risk    Last Housing Risk Score: 0  Physical Activity: Inactive   Days of Exercise per Week: 0 days   Minutes of Exercise per Session: 0 min  Social Connections: Moderately Isolated   Frequency of Communication with Friends and Family: More than three times a week   Frequency of Social Gatherings with Friends and Family: Twice a week   Attends Religious Services: More than 4 times per year   Active Member of [employer] or Organizations: No   Attends Archivist Meetings: Never   Marital Status: Widowed  Stress: No Stress Concern Present   Feeling of Stress : Not at all  Tobacco Use: Medium Risk   Smoking Tobacco Use: Former   Smokeless Tobacco Use: Never  Transportation Needs: No Data processing manager (Medical): No   Lack of Transportation (Non-Medical): No    CCM Care Plan  No Known Allergies  Medications Reviewed Today     Reviewed by Germaine Pomfret, [facility] (Pharmacist) on 07/17/20 at [place] List Status: <None>   Medication Order Taking? Sig Documenting Provider Last Dose  Status Informant  acetaminophen (TYLENOL) 650 MG CR tablet [id number] Yes Take 650 mg by mouth every 8 (eight) hours as needed for pain. [provider] Taking Active   allopurinol (ZYLOPRIM) 100 MG tablet [id number] Yes Take 2 tablets (200 mg total) by mouth daily. Delsa Grana, PA-C Taking Active   Blood Pressure Monitoring (BLOOD PRESSURE KIT) DEVI [id number]  1 Device by Does not apply route daily. Fluctuating blood pressure; please get appropriate size, large cuff Lada, Satira Anis, MD  Active   cholecalciferol (VITAMIN D3) 25 MCG (1000 UNIT) tablet [id number] Yes Take 1,000 Units by mouth daily. [provider] Taking Active   ELIQUIS 5 MG TABS tablet [id number] Yes Take 5 mg by mouth 2 (two) times daily. [provider] Taking Active   enalapril (VASOTEC) 10 MG tablet [id number] Yes Take 1 tablet (10 mg total) by mouth daily. Delsa Grana, PA-C Taking Active   famotidine (PEPCID) 20 MG tablet [id number] Yes Take 20 mg by mouth daily as needed for heartburn or indigestion. [provider] Taking Active   ferrous sulfate 325 (65 FE) MG EC tablet [id number] Yes Take 1 tablet (325 mg total) by mouth daily.  Patient taking differently: Take 325 mg by mouth in the morning and at bedtime.   Delsa Grana, PA-C Taking Active   hydrochlorothiazide (MICROZIDE) 12.5 MG capsule [id number] Yes Take 1 capsule by mouth once daily Delsa Grana, PA-C Taking Active   lovastatin (MEVACOR) 40 MG tablet [id number] Yes Take 1 tablet (40 mg total) by mouth at bedtime. Delsa Grana, PA-C Taking Active   Multiple Vitamins-Minerals (MULTIVITAMIN ADULTS 50+) TABS [id number] Yes Take 1 tablet by mouth daily. [provider] Taking Active   PROAIR HFA 108 [phone] Base) MCG/ACT inhaler [id number] Yes INHALE 2 PUFFS INTO LUNGS EVERY 4 HOURS AS NEEDED FOR WHEEZING FOR SHORTNESS OF BREATH Delsa Grana, PA-C Taking Active   simethicone (GAS-X) 80 MG chewable tablet [id number] Yes Chew 1 tablet (80 mg  total) by mouth 2 (two) times a day.  Patient taking differently: Chew 80 mg by  mouth daily as needed.   Harvest Dark, MD Taking Active Other           Med Note Adolm Joseph Oct 25, 2019  8:41 AM)    Dellis Anes 160-4.5 MCG/ACT inhaler [id number] Yes Inhale 2 puffs into the lungs 2 (two) times daily.  Patient taking differently: Inhale 1 puff into the lungs at bedtime.   Delsa Grana, PA-C Taking Active   triamcinolone (KENALOG) 0.1 % [id number] Yes APPLY CREAM EXTERNALLY TO AFFECTED AREA TWICE DAILY if needed; not more than one week at a time, then take a break for two weeks Delsa Grana, PA-C Taking Active             Patient Active Problem List   Diagnosis Date Noted   Status post gastroplasty-1980s 06/25/2020   CLE (columnar lined esophagus)    Hiatal hernia    Gastric nodule    Angiodysplasia of stomach and duodenum    Cecal polyp    Polyp of descending colon    Polyp of transverse colon    Atrial fibrillation ([place]) 06/14/2019   Current use of anticoagulant therapy 06/14/2019   Iron deficiency anemia 06/14/2019   Thrombocytosis 06/14/2019   GERD (gastroesophageal reflux disease) 04/15/2019   Chronic low back pain 04/15/2019   Right pulmonary embolus ([place]) 04/15/2019   Palpitations 03/13/2019   DOE (dyspnea on exertion) 03/13/2019   Anemia 03/07/2019   Atherosclerosis of both carotid arteries 09/20/2018   Spinal stenosis of lumbar region with neurogenic claudication 09/20/2018   Near syncope 09/10/2018   Aortic atherosclerosis ([place]) 09/05/2018   At high risk for falls 09/05/2018   Morbid obesity with BMI of 45.0-49.9, adult ([place]) 08/29/2017   Osteopenia 05/18/2017   Stage 3 chronic kidney disease ([place]) 04/21/2017   Morbid obesity ([place]) 04/21/2017   Trigger middle finger of left hand 02/28/2017   Chronic gouty arthropathy without tophi 11/29/2016   Hypergammaglobulinemia 11/29/2016   Osteoarthritis of both knees 11/29/2016   Bilateral primary  osteoarthritis of knee 11/29/2016   Allergic rhinitis, seasonal 07/30/2014   Gout 07/30/2014   HLD (hyperlipidemia) 07/30/2014   Osteoarthritis of knee 07/30/2014   Prediabetes 07/30/2014   Asthma, mild persistent 07/30/2014   COPD (chronic obstructive pulmonary disease) ([place]) 07/09/2008   Essential hypertension 06/22/2006    Immunization History  Administered Date(s) Administered   Fluad Quad(high Dose 65+) 10/26/2018, 10/16/2019   Influenza, High Dose Seasonal PF 10/04/2016, 10/12/2017   Influenza-Unspecified 10/26/2014, 09/27/2015, 10/26/2018   Moderna Sars-Covid-2 Vaccination 11/21/2019   PFIZER(Purple Top)SARS-COV-2 Vaccination 02/01/2019, 02/22/2019   Pneumococcal Conjugate-13 02/21/2013   Pneumococcal Polysaccharide-23 10/13/2011   Tdap 10/13/2011    Conditions to be addressed/monitored:  Hypertension, Atrial Fibrillation, Coronary Artery Disease, GERD, Asthma, Chronic Kidney Disease, Osteopenia, Osteoarthritis, Gout, Allergic Rhinitis, and Pre-diabetes  Care Plan : General Pharmacy (Adult)  Updates made by Germaine Pomfret, RPH since 07/18/2020 12:00 AM     Problem: Hypertension, Atrial Fibrillation, Coronary Artery Disease, GERD, Asthma, Chronic Kidney Disease, Osteopenia, Osteoarthritis, Gout, Allergic Rhinitis, and Pre-diabetes   Priority: High     Long-Range Goal: Patient-Specific Goal   Start Date: 07/18/2020  Expected End Date: 01/17/2021  This Visit's Progress: On track  Priority: High  Note:   Current Barriers:  No barriers noted   Pharmacist Clinical Goal(s):  Patient will maintain control of blood pressure as evidenced by BP less than 140/90  through collaboration with PharmD and provider.   Interventions: 1:1 collaboration with Delsa Grana, PA-C regarding development and update  of comprehensive plan of care as evidenced by provider attestation and co-signature Inter-disciplinary care team collaboration (see longitudinal plan of care) Comprehensive  medication review performed; medication list updated in electronic medical record  Hypertension (BP goal <140/90) -Controlled -Current treatment: Enalapril 10 mg daily  HCTZ 12.5 mg daily  -Medications previously tried: NA   -Current home readings: 146/60 -Denies hypotensive/hypertensive symptoms -Reports a rash that she attributes to enalapril -Educated on Importance of home blood pressure monitoring; -Counseled to monitor BP at home weekly, document, and provide log at future appointments -Recommended to continue current medication  Hyperlipidemia: (LDL goal < 70) -Not ideally controlled -Current treatment: Lovastatin 40 mg nightly  -Medications previously tried: NA  -Educated on Importance of limiting foods high in cholesterol; -Recommended to continue current medication  Pre-diabetes (A1c goal <6.5%) -Controlled -Current medications: None -Medications previously tried: NA  -Educated on Carbohydrate counting and/or plate method -Recommended to continue current medication  Atrial Fibrillation (Goal: prevent stroke and major bleeding) -Controlled -CHADSVASC: 4 -Current treatment: Rate control: None Anticoagulation: Eliquis 5 mg twice daily  -Medications previously tried: Beta Blockers  -Counseled on importance of adherence to anticoagulant exactly as prescribed; -Recommended to continue current medication  Asthma (Goal: control symptoms and prevent exacerbations) -Controlled -Current treatment  Proair HFA 2 puffs every 4 hours as needed  Symbicort 2 puffs 1 puff nightly   -Medications previously tried: NA  -Exacerbations requiring treatment in last 6 months: None -Patient reports consistent use of maintenance inhaler although does report she takes less frequently due to it making her feel dizzy if taking too frequently.  -Frequency of rescue inhaler use: every 2-3 months  -Counseled on When to use rescue inhaler -Recommended to continue current medication  Gout  (Goal: Prevent gout flares) -Controlled -Current treatment  Allopurinol 100 mg 2 tablet daily  -Medications previously tried: NA  -Last Gout Flare: >6 months  -Recommended to continue current medication  GERD (Goal: Prevent reflux) -Controlled -Current treatment  Omeprazole 20 mg nightly  -Medications previously tried: NA  -Recommended to continue current medication  Osteoarthritis (Goal: Provide adequate pain relief) -Controlled -Current treatment  Acetaminophen CR 650 mg every 8 hours as needed -Medications previously tried: NA -Counseled patient to limit APAP to 3000 mg daily. She reports typically taking only one tablet daily.  -Counseled patient to avoid NSAIDs due to CKD and Eliquis use.   -Recommended to continue current medication  Chronic Kidney Disease Stage 3a  -All medications assessed for renal dosing and appropriateness in chronic kidney disease. -Recommended to continue current medication  Patient Goals/Self-Care Activities Patient will:  - check blood pressure weekly, document, and provide at future appointments  Follow Up Plan: Telephone follow up appointment with care management team member scheduled for:  01/28/2021 at 3:00 PM      Medication Assistance: None required.  Patient affirms current coverage meets needs.  Compliance/Adherence/Medication fill history: Care Gaps: Shingrix, Covid Booster Colonoscopy  Mammogram  Star-Rating Drugs: Enalapril 10 mg: LF 03/19/20 for 90-DS  Lovastatin 40 mg   Patient's preferred pharmacy is:  [facility] [address]), [place] - [place] ([place]) [place] [address] Phone: [phone] Fax: [phone]  Uses pill box? Yes Pt endorses 100% compliance  We discussed: Current pharmacy is preferred with insurance plan and patient is satisfied with pharmacy services Patient decided to: Continue current medication management strategy  Care Plan and Follow Up  Patient Decision:  Patient agrees to Care Plan and Follow-up.  Plan: Telephone follow up appointment with care management team member scheduled for:  01/28/2021 at 3:00 PM  Doristine Section, [facility] [phone]

## 2020-07-18 NOTE — Patient Instructions (Signed)
Visit Information It was great speaking with you today!  Please let me know if you have any questions about our visit.   Goals Addressed             This Visit's Progress    Track and Manage My Blood Pressure-Hypertension       Timeframe:  Long-Range Goal Priority:  High Start Date:  07/17/2020                            Expected End Date:  01/16/2021                     Follow Up Date 09/23/2020    - check blood pressure weekly    Why is this important?   You won't feel high blood pressure, but it can still hurt your blood vessels.  High blood pressure can cause heart or kidney problems. It can also cause a stroke.  Making lifestyle changes like losing a little weight or eating less salt will help.  Checking your blood pressure at home and at different times of the day can help to control blood pressure.  If the doctor prescribes medicine remember to take it the way the doctor ordered.  Call the office if you cannot afford the medicine or if there are questions about it.     Notes:          Patient Care Plan: General Pharmacy (Adult)     Problem Identified: Hypertension, Atrial Fibrillation, Coronary Artery Disease, GERD, Asthma, Chronic Kidney Disease, Osteopenia, Osteoarthritis, Gout, Allergic Rhinitis, and Pre-diabetes   Priority: High     Long-Range Goal: Patient-Specific Goal   Start Date: 07/18/2020  Expected End Date: 01/17/2021  This Visit's Progress: On track  Priority: High  Note:   Current Barriers:  No barriers noted   Pharmacist Clinical Goal(s):  Patient will maintain control of blood pressure as evidenced by BP less than 140/90  through collaboration with PharmD and provider.   Interventions: 1:1 collaboration with Delsa Grana, PA-C regarding development and update of comprehensive plan of care as evidenced by provider attestation and co-signature Inter-disciplinary care team collaboration (see longitudinal plan of care) Comprehensive medication  review performed; medication list updated in electronic medical record  Hypertension (BP goal <140/90) -Controlled -Current treatment: Enalapril 10 mg daily  HCTZ 12.5 mg daily  -Medications previously tried: NA   -Current home readings: 146/60 -Denies hypotensive/hypertensive symptoms -Reports a rash that she attributes to enalapril -Educated on Importance of home blood pressure monitoring; -Counseled to monitor BP at home weekly, document, and provide log at future appointments -Recommended to continue current medication  Hyperlipidemia: (LDL goal < 70) -Not ideally controlled -Current treatment: Lovastatin 40 mg nightly  -Medications previously tried: NA  -Educated on Importance of limiting foods high in cholesterol; -Recommended to continue current medication  Pre-diabetes (A1c goal <6.5%) -Controlled -Current medications: None -Medications previously tried: NA  -Educated on Carbohydrate counting and/or plate method -Recommended to continue current medication  Atrial Fibrillation (Goal: prevent stroke and major bleeding) -Controlled -CHADSVASC: 4 -Current treatment: Rate control: None Anticoagulation: Eliquis 5 mg twice daily  -Medications previously tried: Beta Blockers  -Counseled on importance of adherence to anticoagulant exactly as prescribed; -Recommended to continue current medication  Asthma (Goal: control symptoms and prevent exacerbations) -Controlled -Current treatment  Proair HFA 2 puffs every 4 hours as needed  Symbicort 2 puffs 1 puff nightly   -Medications previously tried:  NA  -Exacerbations requiring treatment in last 6 months: None -Patient reports consistent use of maintenance inhaler although does report she takes less frequently due to it making her feel dizzy if taking too frequently.  -Frequency of rescue inhaler use: every 2-3 months  -Counseled on When to use rescue inhaler -Recommended to continue current medication  Gout (Goal:  Prevent gout flares) -Controlled -Current treatment  Allopurinol 100 mg 2 tablet daily  -Medications previously tried: NA  -Last Gout Flare: >6 months  -Recommended to continue current medication  GERD (Goal: Prevent reflux) -Controlled -Current treatment  Omeprazole 20 mg nightly  -Medications previously tried: NA  -Recommended to continue current medication  Osteoarthritis (Goal: Provide adequate pain relief) -Controlled -Current treatment  Acetaminophen CR 650 mg every 8 hours as needed -Medications previously tried: NA -Counseled patient to limit APAP to 3000 mg daily. She reports typically taking only one tablet daily.  -Counseled patient to avoid NSAIDs due to CKD and Eliquis use.   -Recommended to continue current medication  Chronic Kidney Disease Stage 3a  -All medications assessed for renal dosing and appropriateness in chronic kidney disease. -Recommended to continue current medication  Patient Goals/Self-Care Activities Patient will:  - check blood pressure weekly, document, and provide at future appointments  Follow Up Plan: Telephone follow up appointment with care management team member scheduled for:  01/28/2021 at 3:00 PM       Ms. Nadal was given information about Chronic Care Management services today including:  CCM service includes personalized support from designated clinical staff supervised by her physician, including individualized plan of care and coordination with other care providers 24/7 contact phone numbers for assistance for urgent and routine care needs. Standard insurance, coinsurance, copays and deductibles apply for chronic care management only during months in which we provide at least 20 minutes of these services. Most insurances cover these services at 100%, however patients may be responsible for any copay, coinsurance and/or deductible if applicable. This service may help you avoid the need for more expensive face-to-face  services. Only one practitioner may furnish and bill the service in a calendar month. The patient may stop CCM services at any time (effective at the end of the month) by phone call to the office staff.  Patient agreed to services and verbal consent obtained.   The patient verbalized understanding of instructions, educational materials, and care plan provided today and declined offer to receive copy of patient instructions, educational materials, and care plan.   Doristine Section, [facility] [phone]

## 2020-08-27 ENCOUNTER — Telehealth: Payer: Self-pay

## 2020-08-27 ENCOUNTER — Ambulatory Visit: Payer: PPO | Admitting: Family Medicine

## 2020-08-27 NOTE — Progress Notes (Signed)
Chronic Care Management Pharmacy Assistant   Name: Colleen [name]  MRN: [id number] DOB: 1945/06/21   Reason for Encounter:Hypertension Disease State Call.   Recent office visits:  No recent Office Visit  Recent consult visits:  No recent [facility] visits:  None in previous 6 months  Medications: Outpatient Encounter Medications as of 08/27/2020  Medication Sig   acetaminophen (TYLENOL) 650 MG CR tablet Take 650 mg by mouth every 8 (eight) hours as needed for pain.   allopurinol (ZYLOPRIM) 100 MG tablet Take 2 tablets (200 mg total) by mouth daily.   Blood Pressure Monitoring (BLOOD PRESSURE KIT) DEVI 1 Device by Does not apply route daily. Fluctuating blood pressure; please get appropriate size, large cuff   cholecalciferol (VITAMIN D3) 25 MCG (1000 UNIT) tablet Take 1,000 Units by mouth daily.   ELIQUIS 5 MG TABS tablet Take 5 mg by mouth 2 (two) times daily.   enalapril (VASOTEC) 10 MG tablet Take 1 tablet (10 mg total) by mouth daily.   famotidine (PEPCID) 20 MG tablet Take 20 mg by mouth daily as needed for heartburn or indigestion.   ferrous sulfate 325 (65 FE) MG EC tablet Take 1 tablet (325 mg total) by mouth daily. (Patient taking differently: Take 325 mg by mouth in the morning and at bedtime.)   hydrochlorothiazide (MICROZIDE) 12.5 MG capsule Take 1 capsule by mouth once daily   lovastatin (MEVACOR) 40 MG tablet Take 1 tablet (40 mg total) by mouth at bedtime.   Multiple Vitamins-Minerals (MULTIVITAMIN ADULTS 50+) TABS Take 1 tablet by mouth daily.   PROAIR HFA 108 (90 Base) MCG/ACT inhaler INHALE 2 PUFFS INTO LUNGS EVERY 4 HOURS AS NEEDED FOR WHEEZING FOR SHORTNESS OF BREATH   simethicone (GAS-X) 80 MG chewable tablet Chew 1 tablet (80 mg total) by mouth 2 (two) times a day. (Patient taking differently: Chew 80 mg by mouth daily as needed.)   SYMBICORT 160-4.5 MCG/ACT inhaler Inhale 2 puffs into the lungs 2 (two) times daily. (Patient taking differently: Inhale 1  puff into the lungs at bedtime.)   triamcinolone (KENALOG) 0.1 % APPLY CREAM EXTERNALLY TO AFFECTED AREA TWICE DAILY if needed; not more than one week at a time, then take a break for two weeks   No facility-administered encounter medications on file as of 08/27/2020.    Care Gaps: Shingrix Vaccine Fecal DNA COVID-19 Vaccine Mammogram Influenza Vaccine Star Rating Drugs: Lovastatin 40 mg last filled 07/20/2020 90 day supply [employer]. Enalapril 10 mg  last filled 07/19/2020 90 day supply [employer]. Medication Fills Gaps: None ID  Reviewed chart prior to disease state call. Spoke with patient regarding BP  Recent Office Vitals: BP Readings from Last 3 Encounters:  04/23/20 (!) 163/90  04/14/20 114/60  02/28/20 115/70   Pulse Readings from Last 3 Encounters:  04/23/20 80  04/14/20 74  02/28/20 94    Wt Readings from Last 3 Encounters:  04/23/20 274 lb (124.3 kg)  04/14/20 273 lb (123.8 kg)  02/28/20 277 lb 9.6 oz (125.9 kg)     Kidney Function Lab Results  Component Value Date/Time   CREATININE 1.09 (H) 02/28/2020 09:05 AM   CREATININE 0.98 (H) 06/12/2019 10:16 AM   GFRNONAA 50 (L) 02/28/2020 09:05 AM   GFRAA 58 (L) 02/28/2020 09:05 AM    BMP Latest Ref Rng & Units 02/28/2020 06/12/2019 03/06/2019  Glucose 65 - 99 mg/dL 93 106(H) 123(H)  BUN 7 - 25 mg/dL _0 Creatinine 0.60 -  0.93 mg/dL 1.09(H) 0.98(H) 1.01(H)  BUN/Creat Ratio 6 - 22 (calc) _0 Sodium 135 - 146 mmol/L 135 139 137  Potassium 3.5 - 5.3 mmol/L 4.2 3.8 4.0  Chloride 98 - 110 mmol/L 99 101 100  CO2 20 - 32 mmol/L _1 Calcium 8.6 - 10.4 mg/dL 9.5 9.9 9.7    Current antihypertensive regimen:  Enalapril 10 mg daily HCTZ 12.5 mg daily  What recent interventions/DTPs have been made by any provider to improve Blood Pressure control since last CPP Visit:  07/17/2020 Daron Offer [facility] consider switching enalapril to valsartan 80 mg daily to see if rash improves, patient  hesitant to make changes. Any recent hospitalizations or ED visits since last visit with CPP? No  Patient states she would like to end CCM service for her  convenience because she prefers to discuss her issue with her PCP.Notified Clinical Pharmacist.  Adherence Review: Is the patient currently on ACE/ARB medication? Yes Does the patient have >5 day gap between last estimated fill dates? No    Anderson Malta Clinical Production designer, theatre/television/film [phone]

## 2020-09-01 ENCOUNTER — Encounter: Payer: Self-pay | Admitting: Family Medicine

## 2020-09-01 ENCOUNTER — Ambulatory Visit: Payer: PPO | Admitting: Family Medicine

## 2020-09-01 ENCOUNTER — Ambulatory Visit (INDEPENDENT_AMBULATORY_CARE_PROVIDER_SITE_OTHER): Payer: PPO | Admitting: Family Medicine

## 2020-09-01 VITALS — BP 122/60 | HR 99 | Temp 97.4°F | Resp 16 | Ht 62.0 in | Wt 278.5 lb

## 2020-09-01 DIAGNOSIS — J449 Chronic obstructive pulmonary disease, unspecified: Secondary | ICD-10-CM

## 2020-09-01 DIAGNOSIS — K219 Gastro-esophageal reflux disease without esophagitis: Secondary | ICD-10-CM | POA: Diagnosis not present

## 2020-09-01 DIAGNOSIS — Z1231 Encounter for screening mammogram for malignant neoplasm of breast: Secondary | ICD-10-CM

## 2020-09-01 DIAGNOSIS — M109 Gout, unspecified: Secondary | ICD-10-CM

## 2020-09-01 DIAGNOSIS — I480 Paroxysmal atrial fibrillation: Secondary | ICD-10-CM

## 2020-09-01 DIAGNOSIS — N1831 Chronic kidney disease, stage 3a: Secondary | ICD-10-CM

## 2020-09-01 DIAGNOSIS — J4530 Mild persistent asthma, uncomplicated: Secondary | ICD-10-CM | POA: Diagnosis not present

## 2020-09-01 DIAGNOSIS — J441 Chronic obstructive pulmonary disease with (acute) exacerbation: Secondary | ICD-10-CM

## 2020-09-01 DIAGNOSIS — I10 Essential (primary) hypertension: Secondary | ICD-10-CM | POA: Diagnosis not present

## 2020-09-01 DIAGNOSIS — E785 Hyperlipidemia, unspecified: Secondary | ICD-10-CM

## 2020-09-01 DIAGNOSIS — R7303 Prediabetes: Secondary | ICD-10-CM | POA: Diagnosis not present

## 2020-09-01 DIAGNOSIS — Z5181 Encounter for therapeutic drug level monitoring: Secondary | ICD-10-CM

## 2020-09-01 DIAGNOSIS — Z9889 Other specified postprocedural states: Secondary | ICD-10-CM | POA: Diagnosis not present

## 2020-09-01 DIAGNOSIS — I4891 Unspecified atrial fibrillation: Secondary | ICD-10-CM | POA: Diagnosis not present

## 2020-09-01 DIAGNOSIS — Z7901 Long term (current) use of anticoagulants: Secondary | ICD-10-CM | POA: Diagnosis not present

## 2020-09-01 DIAGNOSIS — M170 Bilateral primary osteoarthritis of knee: Secondary | ICD-10-CM | POA: Diagnosis not present

## 2020-09-01 DIAGNOSIS — D509 Iron deficiency anemia, unspecified: Secondary | ICD-10-CM

## 2020-09-01 MED ORDER — ENALAPRIL MALEATE 10 MG PO TABS: 10.0000 mg | ORAL_TABLET | Freq: Every day | ORAL | 1 refills | Status: DC

## 2020-09-01 MED ORDER — HYDROCHLOROTHIAZIDE 12.5 MG PO CAPS: 12.5000 mg | ORAL_CAPSULE | Freq: Every day | ORAL | 3 refills | Status: DC

## 2020-09-01 MED ORDER — ALLOPURINOL 100 MG PO TABS: 200.0000 mg | ORAL_TABLET | Freq: Every day | ORAL | 3 refills | Status: DC

## 2020-09-01 NOTE — Progress Notes (Signed)
Name: Colleen [name]   MRN: [id number]    DOB: [birth year]   Date:09/01/2020       Progress Note  Chief Complaint  Patient presents with   Hyperlipidemia   Hypertension     Subjective:   Colleen [name] is a 75 y.o. female, presents to clinic for routine f/up  Seeing specialists at [facility] - hematology for iron deficiency anemia, hx of DVT and PE, getting infusions Last H/H per care everywhere March 22 -  9.6/29.2  Iron panel iron 106, iron saturation 35% TIBC 300 Ferritin 27.9 Last infusion 06/30/2020 F/up with hematology in about 2 weeks  Hypertension:  Currently managed on enalapril 10 mg, HCTZ 12.5 Pt reports good med compliance and denies any SE.   Blood pressure today is well controlled. BP Readings from Last 3 Encounters:  09/01/20 122/60  04/23/20 (!) 163/90  04/14/20 114/60   Pt denies CP, SOB, exertional sx, LE edema, palpitation, Ha's, visual disturbances, lightheadedness, hypotension, syncope.   Hyperlipidemia: Currently treated with lovastatin 40 mg daily, pt reports good med compliance Last Lipids:  recently done and at goal, reviewed today Lab Results  Component Value Date   CHOL 157 02/28/2020   HDL 58 02/28/2020   LDLCALC 80 02/28/2020   TRIG 105 02/28/2020   CHOLHDL 2.7 02/28/2020   - Denies: Chest pain, shortness of breath, myalgias, claudication  On eliquis for PE - no bleeding concerns  GI/GERD on pepcid prn reports sx are well controlled  Prediabetes - last labs A1C was in normal range Recent pertinent labs: Lab Results  Component Value Date   HGBA1C 5.4 02/28/2020   HGBA1C 6.0 (H) 03/06/2019   HGBA1C 6.2 (H) 09/05/2018  Not on meds  Previously est with rheumatology [facility] for OA b/l knees On allopurinol   Afib, HTN consulted cardiology, Dr. Garen Lah - last f/up was 04/14/2020 in sinus rhythm and had stopped BB due to SE- per note - pt to start CCB - but none prescribed or on med list    Current Outpatient Medications:     acetaminophen (TYLENOL) 650 MG CR tablet, Take 650 mg by mouth every 8 (eight) hours as needed for pain., Disp: , Rfl:    allopurinol (ZYLOPRIM) 100 MG tablet, Take 2 tablets (200 mg total) by mouth daily., Disp: 180 tablet, Rfl: 3   Blood Pressure Monitoring (BLOOD PRESSURE KIT) DEVI, 1 Device by Does not apply route daily. Fluctuating blood pressure; please get appropriate size, large cuff, Disp: 1 Device, Rfl: 0   cholecalciferol (VITAMIN D3) 25 MCG (1000 UNIT) tablet, Take 1,000 Units by mouth daily., Disp: , Rfl:    ELIQUIS 5 MG TABS tablet, Take 5 mg by mouth 2 (two) times daily., Disp: , Rfl:    enalapril (VASOTEC) 10 MG tablet, Take 1 tablet (10 mg total) by mouth daily., Disp: 90 tablet, Rfl: 1   famotidine (PEPCID) 20 MG tablet, Take 20 mg by mouth daily as needed for heartburn or indigestion., Disp: , Rfl:    ferrous sulfate 325 (65 FE) MG EC tablet, Take 1 tablet (325 mg total) by mouth daily. (Patient taking differently: Take 325 mg by mouth in the morning and at bedtime.), Disp: 90 tablet, Rfl: 1   hydrochlorothiazide (MICROZIDE) 12.5 MG capsule, Take 1 capsule by mouth once daily, Disp: 90 capsule, Rfl: 3   lovastatin (MEVACOR) 40 MG tablet, Take 1 tablet (40 mg total) by mouth at bedtime., Disp: 90 tablet, Rfl: 3   Multiple Vitamins-Minerals (MULTIVITAMIN ADULTS  50+) TABS, Take 1 tablet by mouth daily., Disp: , Rfl:    PROAIR HFA 108 (90 Base) MCG/ACT inhaler, INHALE 2 PUFFS INTO LUNGS EVERY 4 HOURS AS NEEDED FOR WHEEZING FOR SHORTNESS OF BREATH, Disp: 18 g, Rfl: 5   simethicone (GAS-X) 80 MG chewable tablet, Chew 1 tablet (80 mg total) by mouth 2 (two) times a day. (Patient taking differently: Chew 80 mg by mouth daily as needed.), Disp: 60 tablet, Rfl: 2   SYMBICORT 160-4.5 MCG/ACT inhaler, Inhale 2 puffs into the lungs 2 (two) times daily. (Patient taking differently: Inhale 1 puff into the lungs at bedtime.), Disp: 33 g, Rfl: 5   triamcinolone (KENALOG) 0.1 %, APPLY CREAM EXTERNALLY  TO AFFECTED AREA TWICE DAILY if needed; not more than one week at a time, then take a break for two weeks, Disp: 80 g, Rfl: 0  Patient Active Problem List   Diagnosis Date Noted   Status post gastroplasty-1980s 06/25/2020   CLE (columnar lined esophagus)    Hiatal hernia    Gastric nodule    Angiodysplasia of stomach and duodenum    Cecal polyp    Polyp of descending colon    Polyp of transverse colon    Atrial fibrillation ([place]) 06/14/2019   Current use of anticoagulant therapy 06/14/2019   Iron deficiency anemia 06/14/2019   Thrombocytosis 06/14/2019   GERD (gastroesophageal reflux disease) 04/15/2019   Chronic low back pain 04/15/2019   Right pulmonary embolus ([place]) 04/15/2019   Palpitations 03/13/2019   DOE (dyspnea on exertion) 03/13/2019   Anemia 03/07/2019   Atherosclerosis of both carotid arteries 09/20/2018   Spinal stenosis of lumbar region with neurogenic claudication 09/20/2018   Near syncope 09/10/2018   Aortic atherosclerosis ([place]) 09/05/2018   At high risk for falls 09/05/2018   Morbid obesity with BMI of 45.0-49.9, adult ([place]) 08/29/2017   Osteopenia 05/18/2017   Stage 3 chronic kidney disease ([place]) 04/21/2017   Morbid obesity ([place]) 04/21/2017   Trigger middle finger of left hand 02/28/2017   Chronic gouty arthropathy without tophi 11/29/2016   Hypergammaglobulinemia 11/29/2016   Osteoarthritis of both knees 11/29/2016   Bilateral primary osteoarthritis of knee 11/29/2016   Allergic rhinitis, seasonal 07/30/2014   Gout 07/30/2014   HLD (hyperlipidemia) 07/30/2014   Osteoarthritis of knee 07/30/2014   Prediabetes 07/30/2014   Asthma, mild persistent 07/30/2014   COPD (chronic obstructive pulmonary disease) ([place]) 07/09/2008   Essential hypertension 06/22/2006    Past Surgical History:  Procedure Laterality Date   BREAST EXCISIONAL BIOPSY Left 04/08/2014   Intraductal papilloma-5 mm   COLONOSCOPY WITH PROPOFOL N/A 04/23/2020   Procedure: COLONOSCOPY WITH  PROPOFOL;  Surgeon: Virgel Manifold, MD;  Location: [facility] ENDOSCOPY;  Service: Endoscopy;  Laterality: N/A;   ESOPHAGOGASTRODUODENOSCOPY (EGD) WITH PROPOFOL N/A 04/23/2020   Procedure: ESOPHAGOGASTRODUODENOSCOPY (EGD) WITH PROPOFOL;  Surgeon: Virgel Manifold, MD;  Location: [facility] ENDOSCOPY;  Service: Endoscopy;  Laterality: N/A;   LUMBAR LAMINECTOMY     TONSILLECTOMY      Family History  Problem Relation Age of Onset   Diabetes Mother    Lung cancer Father    Diabetes Sister    Congestive Heart Failure Sister    Glaucoma Sister    Breast cancer Neg Hx     Social History   Tobacco Use   Smoking status: Former    Packs/day: 1.50    Years: 20.00    Pack years: 30.00    Types: Cigarettes    Quit date: 11/13/2008  Years since quitting: 11.8   Smokeless tobacco: Never   Tobacco comments:    smoking cessation materials not required  Vaping Use   Vaping Use: Never used  Substance Use Topics   Alcohol use: Not Currently    Alcohol/week: 1.0 standard drink    Types: 1 Cans of beer per week   Drug use: No     No Known Allergies  Health Maintenance  Topic Date Due   MAMMOGRAM  05/02/2020   COVID-19 Vaccine (4 - Booster) 09/17/2020 (Originally 02/21/2020)   Zoster Vaccines- Shingrix (1 of 2) 12/02/2020 (Originally 04/08/1964)   INFLUENZA VACCINE  04/24/2021 (Originally 08/25/2020)   TETANUS/TDAP  10/12/2021   DEXA SCAN  Completed   Hepatitis C Screening  Completed   PNA vac Low Risk Adult  Completed   HPV VACCINES  Aged Out   Fecal DNA (Cologuard)  Discontinued    Chart Review Today: I personally reviewed active problem list, medication list, allergies, family history, social history, health maintenance, notes from last encounter, lab results, imaging with the patient/caregiver today.   Review of Systems  Constitutional: Negative.   HENT: Negative.    Eyes: Negative.   Respiratory: Negative.    Cardiovascular: Negative.   Gastrointestinal: Negative.    Endocrine: Negative.   Genitourinary: Negative.   Musculoskeletal: Negative.   Skin: Negative.   Allergic/Immunologic: Negative.   Neurological: Negative.   Hematological: Negative.   Psychiatric/Behavioral: Negative.    All other systems reviewed and are negative.   Objective:   Vitals:   09/01/20 1010  BP: 122/60  Pulse: 99  Resp: 16  Temp: (!) 97.4 F (36.3 C)  SpO2: 97%  Weight: 278 lb 8 oz (126.3 kg)  Height: '5\' 2"'  (1.575 m)    Body mass index is 50.94 kg/m.  Physical Exam Vitals and nursing note reviewed.  Constitutional:      General: She is not in acute distress.    Appearance: Normal appearance. She is well-developed. She is obese. She is not ill-appearing, toxic-appearing or diaphoretic.     Interventions: Face mask in place.  HENT:     Head: Normocephalic and atraumatic.     Right Ear: External ear normal.     Left Ear: External ear normal.  Eyes:     General: Lids are normal. No scleral icterus.       Right eye: No discharge.        Left eye: No discharge.     Conjunctiva/sclera: Conjunctivae normal.  Neck:     Trachea: Phonation normal. No tracheal deviation.  Cardiovascular:     Rate and Rhythm: Normal rate and regular rhythm.     Pulses: Normal pulses.          Radial pulses are 2+ on the right side and 2+ on the left side.       Posterior tibial pulses are 2+ on the right side and 2+ on the left side.     Heart sounds: Normal heart sounds. No murmur heard.   No friction rub. No gallop.  Pulmonary:     Effort: Pulmonary effort is normal. No respiratory distress.     Breath sounds: Normal breath sounds. No stridor. No wheezing, rhonchi or rales.  Chest:     Chest wall: No tenderness.  Abdominal:     General: Bowel sounds are normal. There is no distension.     Palpations: Abdomen is soft.  Musculoskeletal:     Right lower leg: No edema.  Left lower leg: No edema.  Skin:    General: Skin is warm and dry.     Coloration: Skin is not  jaundiced or pale.     Findings: No rash.  Neurological:     Mental Status: She is alert.     Motor: No abnormal muscle tone.     Gait: Gait abnormal.     Comments: Antalgic slow gait walks with rolling walker  Psychiatric:        Mood and Affect: Mood normal.        Speech: Speech normal.        Behavior: Behavior normal.        Assessment & Plan:     ICD-10-CM   1. Benign essential HTN  I10 enalapril (VASOTEC) 10 MG tablet    hydrochlorothiazide (MICROZIDE) 12.5 MG capsule    COMPLETE METABOLIC PANEL WITH GFR   well controlled with current meds    2. Gout, unspecified cause, unspecified chronicity, unspecified site  M10.9 allopurinol (ZYLOPRIM) 100 MG tablet    COMPLETE METABOLIC PANEL WITH GFR    Uric acid   200 mg allipurinol to maintain uric acid <6.0    3. Encounter for screening mammogram for malignant neoplasm of breast  Z12.31 MM 3D SCREEN BREAST BILATERAL    4. Stage 3a chronic kidney disease (HCC)  Z30.86 COMPLETE METABOLIC PANEL WITH GFR   recheck labs, monitoring    5. Chronic obstructive pulmonary disease, unspecified COPD type ([place])  J44.9    sx stable on symbicort and albuterol prn    6. Mild persistent asthma without complication  V78.46    controlled sx, rarely using albuterol and no recent exacerbations    7. Gastroesophageal reflux disease without esophagitis  K21.9    sx well controlled on pepcid bid prn    8. Hyperlipidemia, unspecified hyperlipidemia type  N62.9 COMPLETE METABOLIC PANEL WITH GFR   tolerating lovastatin    9. Morbid obesity (HCC)  E66.01 Hemoglobin A1C   limited mobility due to knee pain, reduce calories - may need med weight management, goal to loose weight to qualify for knee surgery BMI    10. Primary osteoarthritis of both knees  M17.0    chronic pain, limited mobility    11. Chronic anticoagulation  B28.41 COMPLETE METABOLIC PANEL WITH GFR    CBC with Differential/Platelet   [facility]    12. Iron deficiency  anemia, unspecified iron deficiency anemia type  D50.9 CBC with Differential/Platelet    Iron, TIBC and Ferritin Panel   on iron and has gotten infusions from [facility]    13. Paroxysmal atrial fibrillation (HCC)  L24.4 COMPLETE METABOLIC PANEL WITH GFR    CBC with Differential/Platelet   on eliquis, did not tolerate BB, did not start CCB, per cardiology    14. Status post gastroplasty-1980s  W10.272 COMPLETE METABOLIC PANEL WITH GFR    CBC with Differential/Platelet    Iron, TIBC and Ferritin Panel   multiple malabsorption deficiencies?    15. Prediabetes  R73.03 Hemoglobin A1C   not on meds, monitoring    16. Encounter for medication monitoring  Z51.81 Hemoglobin A1C    COMPLETE METABOLIC PANEL WITH GFR    CBC with Differential/Platelet    Iron, TIBC and Ferritin Panel    Uric acid     Labs for hem/onc done will sent to [facility] and pt for her f/up appt   Return in about 6 months (around 03/04/2021) for Routine follow-up.   Kristeen Miss  Arther Dames 09/01/20 10:22 AM

## 2020-09-02 LAB — IRON,TIBC AND FERRITIN PANEL
%SAT: 28 % (calc) (ref 16–45)
Ferritin: 207 ng/mL (ref 16–288)
Iron: 70 ug/dL (ref 45–160)
TIBC: 254 mcg/dL (calc) (ref 250–450)

## 2020-09-02 LAB — CBC WITH DIFFERENTIAL/PLATELET
Absolute Monocytes: 476 cells/uL (ref 200–950)
Basophils Absolute: 61 cells/uL (ref 0–200)
Basophils Relative: 0.9 %
Eosinophils Absolute: 82 cells/uL (ref 15–500)
Eosinophils Relative: 1.2 %
HCT: 40.7 % (ref 35.0–45.0)
Hemoglobin: 13.6 g/dL (ref 11.7–15.5)
Lymphs Abs: 2727 cells/uL (ref 850–3900)
MCH: 29.8 pg (ref 27.0–33.0)
MCHC: 33.4 g/dL (ref 32.0–36.0)
MCV: 89.1 fL (ref 80.0–100.0)
MPV: 10.2 fL (ref 7.5–12.5)
Monocytes Relative: 7 %
Neutro Abs: 3454 cells/uL (ref 1500–7800)
Neutrophils Relative %: 50.8 %
Platelets: 318 10*3/uL (ref 140–400)
RBC: 4.57 10*6/uL (ref 3.80–5.10)
RDW: 13.9 % (ref 11.0–15.0)
Total Lymphocyte: 40.1 %
WBC: 6.8 10*3/uL (ref 3.8–10.8)

## 2020-09-02 LAB — COMPLETE METABOLIC PANEL WITH GFR
AG Ratio: 1 (calc) (ref 1.0–2.5)
ALT: 8 U/L (ref 6–29)
AST: 15 U/L (ref 10–35)
Albumin: 3.7 g/dL (ref 3.6–5.1)
Alkaline phosphatase (APISO): 107 U/L (ref 37–153)
BUN/Creatinine Ratio: 24 (calc) — ABNORMAL HIGH (ref 6–22)
BUN: 26 mg/dL — ABNORMAL HIGH (ref 7–25)
CO2: 30 mmol/L (ref 20–32)
Calcium: 10 mg/dL (ref 8.6–10.4)
Chloride: 99 mmol/L (ref 98–110)
Creat: 1.1 mg/dL — ABNORMAL HIGH (ref 0.60–1.00)
Globulin: 3.7 g/dL (calc) (ref 1.9–3.7)
Glucose, Bld: 94 mg/dL (ref 65–99)
Potassium: 4.3 mmol/L (ref 3.5–5.3)
Sodium: 136 mmol/L (ref 135–146)
Total Bilirubin: 0.5 mg/dL (ref 0.2–1.2)
Total Protein: 7.4 g/dL (ref 6.1–8.1)
eGFR: 53 mL/min/{1.73_m2} — ABNORMAL LOW (ref >=60)

## 2020-09-02 LAB — URIC ACID: Uric Acid, Serum: 5.2 mg/dL (ref 2.5–7.0)

## 2020-09-02 LAB — HEMOGLOBIN A1C
Hgb A1c MFr Bld: 6 % of total Hgb — ABNORMAL HIGH (ref <5.7)
Mean Plasma Glucose: 126 mg/dL
eAG (mmol/L): 7 mmol/L

## 2020-09-08 ENCOUNTER — Encounter: Payer: Self-pay | Admitting: Family Medicine

## 2020-09-16 DIAGNOSIS — I2699 Other pulmonary embolism without acute cor pulmonale: Secondary | ICD-10-CM | POA: Diagnosis not present

## 2020-09-16 DIAGNOSIS — D509 Iron deficiency anemia, unspecified: Secondary | ICD-10-CM | POA: Diagnosis not present

## 2020-10-07 ENCOUNTER — Ambulatory Visit
Admission: RE | Admit: 2020-10-07 | Discharge: 2020-10-07 | Disposition: A | Discharge status: Home / Self Care | Payer: PPO | Source: Ambulatory Visit | Attending: Family Medicine | Admitting: Family Medicine

## 2020-10-07 ENCOUNTER — Other Ambulatory Visit: Payer: Self-pay

## 2020-10-07 DIAGNOSIS — Z1231 Encounter for screening mammogram for malignant neoplasm of breast: Secondary | ICD-10-CM | POA: Insufficient documentation

## 2020-10-15 ENCOUNTER — Other Ambulatory Visit: Payer: Self-pay | Admitting: Family Medicine

## 2020-10-15 DIAGNOSIS — I10 Essential (primary) hypertension: Secondary | ICD-10-CM

## 2020-10-20 ENCOUNTER — Ambulatory Visit: Payer: PPO | Admitting: Cardiology

## 2020-10-20 ENCOUNTER — Other Ambulatory Visit: Payer: Self-pay

## 2020-10-20 ENCOUNTER — Encounter: Payer: Self-pay | Admitting: Cardiology

## 2020-10-20 VITALS — BP 128/70 | HR 73 | Ht 65.0 in | Wt 277.0 lb

## 2020-10-20 DIAGNOSIS — I480 Paroxysmal atrial fibrillation: Secondary | ICD-10-CM | POA: Diagnosis not present

## 2020-10-20 DIAGNOSIS — I10 Essential (primary) hypertension: Secondary | ICD-10-CM

## 2020-10-20 NOTE — Progress Notes (Signed)
Cardiology Office Note:    Date:  10/20/2020   ID:  Colleen [name], DOB [birth year], MRN [id number]  PCP:  Delsa Grana, PA-C  Cardiologist:  Kate Sable, MD  Electrophysiologist:  None   Referring MD: Delsa Grana, PA-C   Chief Complaint  Patient presents with   Other    6 month follow up -- Meds reviewed verbally with patient.     History of Present Illness:    Colleen [name] is a 75 y.o. female with a hx of hypertension, hyperlipidemia, COPD, PE, paroxysmal atrial fibrillation on Eliquis, former smoker who presents for follow-up.   Being seen for paroxysmal A. fib, blood pressures well controlled.  Follows up with hematology due to history of PE.  Tolerating Eliquis without any adverse effects.  Denies palpitations, dizziness, chest pain, shortness of breath.  Feels well, has no concerns at this time.    Prior notes Patient was admitted at [facility] 2021 due to palpitations.  Chest CTA showed PE.  While in the hospital, telemetry monitor did note paroxysmal atrial fibrillation.  Eliquis was started for PE protocol.  Referral to hematology also placed.   Echocardiogram 03/2019 showed normal systolic and diastolic function,  Lexiscan Myoview 05/2019 with no evidence of ischemia.  Dyspnea on exertion was attributed to COPD.   Past Medical History:  Diagnosis Date   Asthma    Cataract    COPD (chronic obstructive pulmonary disease) (HCC)    GERD (gastroesophageal reflux disease)    Gout    History of pulmonary embolus (PE)    Hyperlipidemia    Hypertension    Osteoarthrosis    Prediabetes     Past Surgical History:  Procedure Laterality Date   BREAST BIOPSY Left 04/08/2014   intraductal papilloma 5 mm   COLONOSCOPY WITH PROPOFOL N/A 04/23/2020   Procedure: COLONOSCOPY WITH PROPOFOL;  Surgeon: Virgel Manifold, MD;  Location: [facility] ENDOSCOPY;  Service: Endoscopy;  Laterality: N/A;   ESOPHAGOGASTRODUODENOSCOPY (EGD) WITH PROPOFOL N/A 04/23/2020   Procedure:  ESOPHAGOGASTRODUODENOSCOPY (EGD) WITH PROPOFOL;  Surgeon: Virgel Manifold, MD;  Location: [facility] ENDOSCOPY;  Service: Endoscopy;  Laterality: N/A;   LUMBAR LAMINECTOMY     TONSILLECTOMY      Current Medications: Current Meds  Medication Sig   acetaminophen (TYLENOL) 650 MG CR tablet Take 650 mg by mouth every 8 (eight) hours as needed for pain.   allopurinol (ZYLOPRIM) 100 MG tablet Take 2 tablets (200 mg total) by mouth daily.   Blood Pressure Monitoring (BLOOD PRESSURE KIT) DEVI 1 Device by Does not apply route daily. Fluctuating blood pressure; please get appropriate size, large cuff   cholecalciferol (VITAMIN D3) 25 MCG (1000 UNIT) tablet Take 1,000 Units by mouth daily.   ELIQUIS 5 MG TABS tablet Take 5 mg by mouth 2 (two) times daily.   enalapril (VASOTEC) 10 MG tablet Take 1 tablet (10 mg total) by mouth daily.   famotidine (PEPCID) 20 MG tablet Take 20 mg by mouth daily as needed for heartburn or indigestion.   ferrous sulfate 325 (65 FE) MG EC tablet Take 1 tablet (325 mg total) by mouth daily.   hydrochlorothiazide (MICROZIDE) 12.5 MG capsule Take 1 capsule (12.5 mg total) by mouth daily.   lovastatin (MEVACOR) 40 MG tablet Take 1 tablet (40 mg total) by mouth at bedtime.   Multiple Vitamins-Minerals (MULTIVITAMIN ADULTS 50+) TABS Take 1 tablet by mouth daily.   PROAIR HFA 108 (90 Base) MCG/ACT inhaler INHALE 2 PUFFS INTO LUNGS EVERY 4  HOURS AS NEEDED FOR WHEEZING FOR SHORTNESS OF BREATH   simethicone (GAS-X) 80 MG chewable tablet Chew 1 tablet (80 mg total) by mouth 2 (two) times a day.   SYMBICORT 160-4.5 MCG/ACT inhaler Inhale 2 puffs into the lungs 2 (two) times daily.   triamcinolone (KENALOG) 0.1 % APPLY CREAM EXTERNALLY TO AFFECTED AREA TWICE DAILY if needed; not more than one week at a time, then take a break for two weeks     Allergies:   Patient has no known allergies.   Social History   Socioeconomic History   Marital status: Widowed    Spouse name: Titus Dubin    Number of children: 0   Years of education: Not on file   Highest education level: 12th grade  Occupational History   Occupation: Retired  Tobacco Use   Smoking status: Former    Packs/day: 1.50    Years: 20.00    Pack years: 30.00    Types: Cigarettes    Quit date: 11/13/2008    Years since quitting: 11.9   Smokeless tobacco: Never   Tobacco comments:    smoking cessation materials not required  Vaping Use   Vaping Use: Never used  Substance and Sexual Activity   Alcohol use: Not Currently    Alcohol/week: 1.0 standard drink    Types: 1 Cans of beer per week   Drug use: No   Sexual activity: Not Currently  Other Topics Concern   Not on file  Social History Narrative   Pt lives alone   Social Determinants of Health   Financial Resource Strain: Low Risk    Difficulty of Paying Living Expenses: Not hard at all  Food Insecurity: No Food Insecurity   Worried About Charity fundraiser in the Last Year: Never true   [place] in the Last Year: Never true  Transportation Needs: No Transportation Needs   Lack of Transportation (Medical): No   Lack of Transportation (Non-Medical): No  Physical Activity: Inactive   Days of Exercise per Week: 0 days   Minutes of Exercise per Session: 0 min  Stress: No Stress Concern Present   Feeling of Stress : Not at all  Social Connections: Moderately Isolated   Frequency of Communication with Friends and Family: More than three times a week   Frequency of Social Gatherings with Friends and Family: Twice a week   Attends Religious Services: More than 4 times per year   Active Member of [employer] or Organizations: No   Attends Archivist Meetings: Never   Marital Status: Widowed     Family History: The patient's family history includes Congestive Heart Failure in her sister; Diabetes in her mother and sister; Glaucoma in her sister; Lung cancer in her father. There is no history of Breast cancer.  ROS:   Please see the  history of present illness.     All other systems reviewed and are negative.  EKGs/Labs/Other Studies Reviewed:    The following studies were reviewed today:   EKG:  EKG is  ordered today.  The ekg ordered today demonstrates sinus rhythm  Recent Labs: 09/01/2020: ALT 8; BUN 26; Creat 1.10; Hemoglobin 13.6; Platelets 318; Potassium 4.3; Sodium 136  Recent Lipid Panel    Component Value Date/Time   CHOL 157 02/28/2020 0905   CHOL 161 07/30/2014 0830   TRIG 105 02/28/2020 0905   HDL 58 02/28/2020 0905   HDL 82 07/30/2014 0830   CHOLHDL 2.7 02/28/2020 0905  VLDL 15 12/31/2015 0922   LDLCALC 80 02/28/2020 0905    Physical Exam:    VS:  BP 128/70 (BP Location: Right Arm, Patient Position: Sitting, Cuff Size: Large)   Pulse 73   Ht $R'5\' 5"'fA$  (1.651 m)   Wt 277 lb (125.6 kg)   SpO2 99%   BMI 46.10 kg/m     Wt Readings from Last 3 Encounters:  10/20/20 277 lb (125.6 kg)  09/01/20 278 lb 8 oz (126.3 kg)  04/23/20 274 lb (124.3 kg)     GEN:  Well nourished, well developed in no acute distress, obese HEENT: Normal NECK: No JVD; No carotid bruits LYMPHATICS: No lymphadenopathy CARDIAC: Regular, frequent skipped beats, no murmurs, rubs, gallops RESPIRATORY:  Clear to auscultation without rales, wheezing or rhonchi  ABDOMEN: Soft, non-tender, non-distended MUSCULOSKELETAL:  trace edema; No deformity  SKIN: Warm and dry NEUROLOGIC:  Alert and oriented x 3 PSYCHIATRIC:  Normal affect   ASSESSMENT:    1. Primary hypertension   2. Paroxysmal atrial fibrillation (HCC)   3. Morbid obesity ([place])     PLAN:    In order of problems listed above:  Hypertension, blood pressure adequately controlled.  Continue HCTZ 12.5 mg daily, enalapril 5 mg daily.  paroxysmal atrial fibrillation, currently in sinus rhythm, denies palpitations.  Continue Eliquis.  Consider calcium channel blocker if she develops palpitations.  Previously intolerant to beta-blockers. morbid obesity, continue eating  healthy, low calorie diet.  Follow-up in 12 months   This note was generated in part or whole with voice recognition software. Voice recognition is usually quite accurate but there are transcription errors that can and very often do occur. I apologize for any typographical errors that were not detected and corrected.  Medication Adjustments/Labs and Tests Ordered: Current medicines are reviewed at length with the patient today.  Concerns regarding medicines are outlined above.  Orders Placed This Encounter  Procedures   EKG 12-Lead    No orders of the defined types were placed in this encounter.   Patient Instructions  Medication Instructions:  Your physician recommends that you continue on your current medications as directed. Please refer to the Current Medication list given to you today.  *If you need a refill on your cardiac medications before your next appointment, please call your pharmacy*   Lab Work: None ordered If you have labs (blood work) drawn today and your tests are completely normal, you will receive your results only by: [place] (if you have MyChart) OR A paper copy in the mail If you have any lab test that is abnormal or we need to change your treatment, we will call you to review the results.   Testing/Procedures: None ordered   Follow-Up: At [facility], you and your health needs are our priority.  As part of our continuing mission to provide you with exceptional heart care, we have created designated Provider Care Teams.  These Care Teams include your primary Cardiologist (physician) and Advanced Practice Providers (APPs -  Physician Assistants and Nurse Practitioners) who all work together to provide you with the care you need, when you need it.  We recommend signing up for the patient portal called "MyChart".  Sign up information is provided on this After Visit Summary.  MyChart is used to connect with patients for Virtual Visits  (Telemedicine).  Patients are able to view lab/test results, encounter notes, upcoming appointments, etc.  Non-urgent messages can be sent to your provider as well.   To learn more  about what you can do with MyChart, go to [email or web address].    Your next appointment:   1 year(s)  The format for your next appointment:   In Person  Provider:   Kate Sable, MD   Other Instructions    Signed, Kate Sable, MD  10/20/2020 9:54 AM    [place]

## 2020-10-20 NOTE — Patient Instructions (Signed)

## 2020-11-04 ENCOUNTER — Telehealth: Payer: Self-pay

## 2020-11-04 NOTE — Telephone Encounter (Signed)
I spoke to pt and she stated that she is not able to come in for a visit as she needs help and unable to walk without assistance... Please advise

## 2020-11-04 NOTE — Telephone Encounter (Signed)
-----   Message from Virgel Manifold, MD sent at 10/30/2020  1:39 PM EDT ----- Pt was supposed to follow up in clinic for h pylori eradication testing, but never did. Can you have her follow up please so we can see how she is doing and get h pylori testing done

## 2020-11-05 NOTE — Telephone Encounter (Signed)
Pt said she can do a telephone call, not a video/virtual as she does not have the capability

## 2020-11-11 ENCOUNTER — Ambulatory Visit: Payer: Self-pay | Admitting: *Deleted

## 2020-11-11 ENCOUNTER — Telehealth: Payer: Self-pay | Admitting: Gastroenterology

## 2020-11-11 NOTE — Telephone Encounter (Signed)
Do we have anything available tomorrow, just in case she would like to be seen?

## 2020-11-11 NOTE — Telephone Encounter (Signed)
Pt. Requesting a call back to find out about breath test

## 2020-11-11 NOTE — Telephone Encounter (Signed)
Patient is calling with concerns of bleeding- patient had boil open today and reports she has significant bleeding with it- 1/4 cup est. Blood loss per patient. Patient was told to notify PCP with any blood loss due to her history of iron deficiency. Patient advised I would let provider know- patient states she needs morning appointment for her visit and she is afraid she may not be able to get up on table- it is too high. Patient advised would send message and have office contact her with PCP instruction- patient asked to call back after 2pm. .

## 2020-11-11 NOTE — Telephone Encounter (Signed)
Reason for Disposition  Boil > 1/2 inch across (> 12 mm; larger than a marble)  Answer Assessment - Initial Assessment Questions 1. APPEARANCE of BOIL: "What does the boil look like?"      Finally opened- had bleeding- stopped- started with pink discharge- then had bleeding 2. LOCATION: "Where is the boil located?"      Inner thigh- groin area- R 3. NUMBER: "How many boils are there?"      Multiple- but only one that bled today 4. SIZE: "How big is the boil?" (e.g., inches, cm; compare to size of a coin or other object)     Quarter size Patient is concerned about the amount of blood she lost when she had cyst open today  Protocols used: Boil (Skin Abscess)-A-AH

## 2020-11-11 NOTE — Telephone Encounter (Signed)
Spoke with pt and scheduled her an appointment with Dr Ky Barban for tomorrow

## 2020-11-12 ENCOUNTER — Encounter: Payer: Self-pay | Admitting: Family Medicine

## 2020-11-12 ENCOUNTER — Ambulatory Visit (INDEPENDENT_AMBULATORY_CARE_PROVIDER_SITE_OTHER): Payer: PPO | Admitting: Family Medicine

## 2020-11-12 ENCOUNTER — Other Ambulatory Visit: Payer: Self-pay

## 2020-11-12 VITALS — BP 118/78 | HR 98 | Temp 97.7°F | Resp 18 | Ht 62.0 in | Wt 274.5 lb

## 2020-11-12 DIAGNOSIS — L732 Hidradenitis suppurativa: Secondary | ICD-10-CM | POA: Diagnosis not present

## 2020-11-12 MED ORDER — MUPIROCIN CALCIUM 2 % EX CREA: 1.0000 "application " | TOPICAL_CREAM | Freq: Two times a day (BID) | CUTANEOUS | 1 refills | Status: DC

## 2020-11-12 MED ORDER — DOXYCYCLINE HYCLATE 100 MG PO TABS: 100.0000 mg | ORAL_TABLET | Freq: Two times a day (BID) | ORAL | 0 refills | Status: AC

## 2020-11-12 NOTE — Assessment & Plan Note (Signed)
Chronic with current flare. Rx doxycycline and mupirocin ointment. F/u if persistent after antibiotics. Discussed possible future referral to surgery given longstanding history and recurrent flares, patient declines at this time.

## 2020-11-12 NOTE — Progress Notes (Signed)
   SUBJECTIVE:   CHIEF COMPLAINT / HPI:   BOIL - h/o recurrent boils in groin, usually gets every few weeks - has current boil in groin, spontaneously drained a week ago but with persistent drainage/bleeding - takes eliquis for afib, h/o PE - strong FH of recurrent boils in mom and sisters in axillae and groin. - putting alcohol, hot pads, antibiotic ointment on it - no fevers, N/V, pain - urinating well.    OBJECTIVE:   BP 118/78   Pulse 98   Temp 97.7 F (36.5 C) (Oral)   Resp 18   Ht 5\' 2"  (1.575 m)   Wt 274 lb 8 oz (124.5 kg)   SpO2 99%   BMI 50.21 kg/m   Gen: elderly, obese, in NAD Skin: multiple scars, indurated nodules in bilateral groin. No surrounding erythema. ~1cm area of current induration with active purulent drainage.  ASSESSMENT/PLAN:   Hidradenitis suppurativa Chronic with current flare. Rx doxycycline and mupirocin ointment. F/u if persistent after antibiotics. Discussed possible future referral to surgery given longstanding history and recurrent flares, patient declines at this time.    Myles Gip, DO

## 2020-11-12 NOTE — Patient Instructions (Signed)
It was great to see you!  Our plans for today:  - Take the antibiotic twice daily for 10 days.  - Use the antibiotic ointment twice daily. - Come back if you don't see improvement after finishing the antibiotics by mouth.  Take care and seek immediate care sooner if you develop any concerns.   Dr. Ky Barban

## 2020-11-20 ENCOUNTER — Telehealth: Payer: Self-pay | Admitting: Gastroenterology

## 2020-11-20 NOTE — Telephone Encounter (Signed)
Pt. Calling to find out the status on her breathing test to find out if she still has bacteria in her stomach. She is requesting a call back

## 2020-11-21 NOTE — Telephone Encounter (Signed)
Called pt no answer and VM not set up  

## 2020-12-02 ENCOUNTER — Ambulatory Visit (INDEPENDENT_AMBULATORY_CARE_PROVIDER_SITE_OTHER): Payer: PPO

## 2020-12-02 DIAGNOSIS — Z0000 Encounter for general adult medical examination without abnormal findings: Secondary | ICD-10-CM

## 2020-12-02 NOTE — Patient Instructions (Signed)
Colleen [name] , Thank you for taking time to come for your Medicare Wellness Visit. I appreciate your ongoing commitment to your health goals. Please review the following plan we discussed and let me know if I can assist you in the future.   Screening recommendations/referrals: Colonoscopy: 04/23/2020 Mammogram: 10/07/2020 Bone Density: 05/18/17 Recommended yearly ophthalmology/optometry visit for glaucoma screening and checkup Recommended yearly dental visit for hygiene and checkup  Vaccinations: Influenza vaccine: 10/07/20 Pneumococcal vaccine: 02/21/13 Tdap vaccine: 10/13/2011 Shingles vaccine: declined   Covid-19:02/01/2019, 02/22/19, 11/21/19  Advanced directives: yes, please bring copy to office  Conditions/risks identified:   Next appointment: Follow up in one year for your annual wellness visit    Preventive Care 74 Years and Older, Female Preventive care refers to lifestyle choices and visits with your health care provider that can promote health and wellness. What does preventive care include? A yearly physical exam. This is also called an annual well check. Dental exams once or twice a year. Routine eye exams. Ask your health care provider how often you should have your eyes checked. Personal lifestyle choices, including: Daily care of your teeth and gums. Regular physical activity. Eating a healthy diet. Avoiding tobacco and drug use. Limiting alcohol use. Practicing safe sex. Taking low-dose aspirin every day. Taking vitamin and mineral supplements as recommended by your health care provider. What happens during an annual well check? The services and screenings done by your health care provider during your annual well check will depend on your age, overall health, lifestyle risk factors, and family history of disease. Counseling  Your health care provider may ask you questions about your: Alcohol use. Tobacco use. Drug use. Emotional well-being. Home and relationship  well-being. Sexual activity. Eating habits. History of falls. Memory and ability to understand (cognition). Work and work Statistician. Reproductive health. Screening  You may have the following tests or measurements: Height, weight, and BMI. Blood pressure. Lipid and cholesterol levels. These may be checked every 5 years, or more frequently if you are over 65 years old. Skin check. Lung cancer screening. You may have this screening every year starting at age 74 if you have a 30-pack-year history of smoking and currently smoke or have quit within the past 15 years. Fecal occult blood test (FOBT) of the stool. You may have this test every year starting at age 74. Flexible sigmoidoscopy or colonoscopy. You may have a sigmoidoscopy every 5 years or a colonoscopy every 10 years starting at age 74. Hepatitis C blood test. Hepatitis B blood test. Sexually transmitted disease (STD) testing. Diabetes screening. This is done by checking your blood sugar (glucose) after you have not eaten for a while (fasting). You may have this done every 1-3 years. Bone density scan. This is done to screen for osteoporosis. You may have this done starting at age 74. Mammogram. This may be done every 1-2 years. Talk to your health care provider about how often you should have regular mammograms. Talk with your health care provider about your test results, treatment options, and if necessary, the need for more tests. Vaccines  Your health care provider may recommend certain vaccines, such as: Influenza vaccine. This is recommended every year. Tetanus, diphtheria, and acellular pertussis (Tdap, Td) vaccine. You may need a Td booster every 10 years. Zoster vaccine. You may need this after age 74. Pneumococcal 13-valent conjugate (PCV13) vaccine. One dose is recommended after age 60. Pneumococcal polysaccharide (PPSV23) vaccine. One dose is recommended after age 74. Talk to your health care  provider about which  screenings and vaccines you need and how often you need them. This information is not intended to replace advice given to you by your health care provider. Make sure you discuss any questions you have with your health care provider. Document Released: 02/07/2015 Document Revised: 10/01/2015 Document Reviewed: 11/12/2014 Elsevier Interactive Patient Education  2017 [place] Prevention in the Home Falls can cause injuries. They can happen to people of all ages. There are many things you can do to make your home safe and to help prevent falls. What can I do on the outside of my home? Regularly fix the edges of walkways and driveways and fix any cracks. Remove anything that might make you trip as you walk through a door, such as a raised step or threshold. Trim any bushes or trees on the path to your home. Use bright outdoor lighting. Clear any walking paths of anything that might make someone trip, such as rocks or tools. Regularly check to see if handrails are loose or broken. Make sure that both sides of any steps have handrails. Any raised decks and porches should have guardrails on the edges. Have any leaves, snow, or ice cleared regularly. Use sand or salt on walking paths during winter. Clean up any spills in your garage right away. This includes oil or grease spills. What can I do in the bathroom? Use night lights. Install grab bars by the toilet and in the tub and shower. Do not use towel bars as grab bars. Use non-skid mats or decals in the tub or shower. If you need to sit down in the shower, use a plastic, non-slip stool. Keep the floor dry. Clean up any water that spills on the floor as soon as it happens. Remove soap buildup in the tub or shower regularly. Attach bath mats securely with double-sided non-slip rug tape. Do not have throw rugs and other things on the floor that can make you trip. What can I do in the bedroom? Use night lights. Make sure that you have a  light by your bed that is easy to reach. Do not use any sheets or blankets that are too big for your bed. They should not hang down onto the floor. Have a firm chair that has side arms. You can use this for support while you get dressed. Do not have throw rugs and other things on the floor that can make you trip. What can I do in the kitchen? Clean up any spills right away. Avoid walking on wet floors. Keep items that you use a lot in easy-to-reach places. If you need to reach something above you, use a strong step stool that has a grab bar. Keep electrical cords out of the way. Do not use floor polish or wax that makes floors slippery. If you must use wax, use non-skid floor wax. Do not have throw rugs and other things on the floor that can make you trip. What can I do with my stairs? Do not leave any items on the stairs. Make sure that there are handrails on both sides of the stairs and use them. Fix handrails that are broken or loose. Make sure that handrails are as long as the stairways. Check any carpeting to make sure that it is firmly attached to the stairs. Fix any carpet that is loose or worn. Avoid having throw rugs at the top or bottom of the stairs. If you do have throw rugs, attach them to the  floor with carpet tape. Make sure that you have a light switch at the top of the stairs and the bottom of the stairs. If you do not have them, ask someone to add them for you. What else can I do to help prevent falls? Wear shoes that: Do not have high heels. Have rubber bottoms. Are comfortable and fit you well. Are closed at the toe. Do not wear sandals. If you use a stepladder: Make sure that it is fully opened. Do not climb a closed stepladder. Make sure that both sides of the stepladder are locked into place. Ask someone to hold it for you, if possible. Clearly mark and make sure that you can see: Any grab bars or handrails. First and last steps. Where the edge of each step  is. Use tools that help you move around (mobility aids) if they are needed. These include: Canes. Walkers. Scooters. Crutches. Turn on the lights when you go into a dark area. Replace any light bulbs as soon as they burn out. Set up your furniture so you have a clear path. Avoid moving your furniture around. If any of your floors are uneven, fix them. If there are any pets around you, be aware of where they are. Review your medicines with your doctor. Some medicines can make you feel dizzy. This can increase your chance of falling. Ask your doctor what other things that you can do to help prevent falls. This information is not intended to replace advice given to you by your health care provider. Make sure you discuss any questions you have with your health care provider. Document Released: 11/07/2008 Document Revised: 06/19/2015 Document Reviewed: 02/15/2014 Elsevier Interactive Patient Education  2017 [employer].

## 2020-12-02 NOTE — Progress Notes (Signed)
Subjective:   Colleen [name] is a 75 y.o. female who presents for Medicare Annual (Subsequent) preventive examination. Virtual Visit via Telephone Note  I connected with  [name] on 12/02/20 at 10:40 AM EST by telephone and verified that I am speaking with the correct person using two identifiers.  Location: Patient: home Provider: [place] Persons participating in the virtual visit: [place]   I discussed the limitations, risks, security and privacy concerns of performing an evaluation and management service by telephone and the availability of in person appointments. The patient expressed understanding and agreed to proceed.  Interactive audio and video telecommunications were attempted between this nurse and patient, however failed, due to patient having technical difficulties OR patient did not have access to video capability.  We continued and completed visit with audio only.  Some vital signs may be absent or patient reported.   [name]   Review of Systems     Cardiac Risk Factors include: advanced age (>1mn, >>64women);obesity (BMI >30kg/m2);sedentary lifestyle     Objective:    There were no vitals filed for this visit. There is no height or weight on file to calculate BMI.  Advanced Directives 12/02/2020 12/02/2020 04/23/2020 11/29/2019 02/11/2019 02/10/2019 09/10/2018  Does Patient Have a Medical Advance Directive? _0  Yes -  Type of AAcademic librarian- Healthcare Power of ALe MarsLiving will HBurr OakLiving will - [place] will H[place]in Does patient want to make changes to medical advance directive? - - - - No - Patient declined No - Patient declined No - Patient declined  Copy of HWaupunin Chart? - - No - copy requested No - copy requested No - copy requested No - copy requested -  Would patient like  information on creating a medical advance directive? - - - - - - -    Current Medications (verified) Outpatient Encounter Medications as of 12/02/2020  Medication Sig   acetaminophen (TYLENOL) 650 MG CR tablet Take 650 mg by mouth every 8 (eight) hours as needed for pain.   allopurinol (ZYLOPRIM) 100 MG tablet Take 2 tablets (200 mg total) by mouth daily.   Blood Pressure Monitoring (BLOOD PRESSURE KIT) DEVI 1 Device by Does not apply route daily. Fluctuating blood pressure; please get appropriate size, large cuff   cholecalciferol (VITAMIN D3) 25 MCG (1000 UNIT) tablet Take 1,000 Units by mouth daily.   ELIQUIS 5 MG TABS tablet Take 5 mg by mouth 2 (two) times daily.   enalapril (VASOTEC) 10 MG tablet Take 1 tablet (10 mg total) by mouth daily.   famotidine (PEPCID) 20 MG tablet Take 20 mg by mouth daily as needed for heartburn or indigestion.   ferrous sulfate 325 (65 FE) MG EC tablet Take 1 tablet (325 mg total) by mouth daily.   hydrochlorothiazide (MICROZIDE) 12.5 MG capsule Take 1 capsule (12.5 mg total) by mouth daily.   lovastatin (MEVACOR) 40 MG tablet Take 1 tablet (40 mg total) by mouth at bedtime.   mupirocin cream (BACTROBAN) 2 % Apply 1 application topically 2 (two) times daily.   PROAIR HFA 108 (90 Base) MCG/ACT inhaler INHALE 2 PUFFS INTO LUNGS EVERY 4 HOURS AS NEEDED FOR WHEEZING FOR SHORTNESS OF BREATH   simethicone (GAS-X) 80 MG chewable tablet Chew 1 tablet (80 mg total) by mouth 2 (two) times a day.   SYMBICORT 160-4.5 MCG/ACT inhaler Inhale  2 puffs into the lungs 2 (two) times daily.   triamcinolone (KENALOG) 0.1 % APPLY CREAM EXTERNALLY TO AFFECTED AREA TWICE DAILY if needed; not more than one week at a time, then take a break for two weeks   Multiple Vitamins-Minerals (MULTIVITAMIN ADULTS 50+) TABS Take 1 tablet by mouth daily. (Patient not taking: Reported on 12/02/2020)   No facility-administered encounter medications on file as of 12/02/2020.    Allergies  (verified) Patient has no known allergies.   History: Past Medical History:  Diagnosis Date   Asthma    Cataract    COPD (chronic obstructive pulmonary disease) (HCC)    GERD (gastroesophageal reflux disease)    Gout    History of pulmonary embolus (PE)    Hyperlipidemia    Hypertension    Osteoarthrosis    Prediabetes    Past Surgical History:  Procedure Laterality Date   BREAST BIOPSY Left 04/08/2014   intraductal papilloma 5 mm   COLONOSCOPY WITH PROPOFOL N/A 04/23/2020   Procedure: COLONOSCOPY WITH PROPOFOL;  Surgeon: Virgel Manifold, MD;  Location: [facility] ENDOSCOPY;  Service: Endoscopy;  Laterality: N/A;   ESOPHAGOGASTRODUODENOSCOPY (EGD) WITH PROPOFOL N/A 04/23/2020   Procedure: ESOPHAGOGASTRODUODENOSCOPY (EGD) WITH PROPOFOL;  Surgeon: Virgel Manifold, MD;  Location: [facility] ENDOSCOPY;  Service: Endoscopy;  Laterality: N/A;   LUMBAR LAMINECTOMY     TONSILLECTOMY     Family History  Problem Relation Age of Onset   Diabetes Mother    Lung cancer Father    Diabetes Sister    Congestive Heart Failure Sister    Glaucoma Sister    Breast cancer Neg Hx    Social History   Socioeconomic History   Marital status: Widowed    Spouse name: Titus Dubin   Number of children: 0   Years of education: Not on file   Highest education level: 12th grade  Occupational History   Occupation: Retired  Tobacco Use   Smoking status: Former    Packs/day: 1.50    Years: 20.00    Pack years: 30.00    Types: Cigarettes    Quit date: 11/13/2008    Years since quitting: 12.0   Smokeless tobacco: Never   Tobacco comments:    smoking cessation materials not required  Vaping Use   Vaping Use: Never used  Substance and Sexual Activity   Alcohol use: Not Currently   Drug use: No   Sexual activity: Not Currently  Other Topics Concern   Not on file  Social History Narrative   Pt lives alone   Social Determinants of Health   Financial Resource Strain: Low Risk    Difficulty of  Paying Living Expenses: Not hard at all  Food Insecurity: Not on file  Transportation Needs: Not on file  Physical Activity: Not on file  Stress: Not on file  Social Connections: Not on file    Tobacco Counseling Counseling given: No Tobacco comments: smoking cessation materials not required   Clinical Intake:  Pre-visit preparation completed: Yes  Pain : No/denies pain     Diabetes: No            Activities of Daily Living In your present state of health, do you have any difficulty performing the following activities: 12/02/2020 11/12/2020  Hearing? - N  Vision? N N  Difficulty concentrating or making decisions? N N  Walking or climbing stairs? Y Y  Dressing or bathing? N N  Doing errands, shopping? N N  Preparing Food and eating ? N -  Using the Toilet? N -  In the past six months, have you accidently leaked urine? N -  Do you have problems with loss of bowel control? N -  Managing your Medications? N -  Managing your Finances? N -  Housekeeping or managing your Housekeeping? N -  Some recent data might be hidden    Patient Care Team: Delsa Grana, PA-C as PCP - General (Family Medicine) Kate Sable, MD as PCP - Cardiology (Cardiology) Inda Castle, MD as Consulting Physician (Rheumatology) Benedetto Goad, RN as Registered Nurse Virgel Manifold, MD as Consulting Physician (Gastroenterology)  Indicate any recent Medical Services you may have received from other than [facility] providers in the past year (date may be approximate).     Assessment:   This is a routine wellness examination for [place].  Hearing/Vision screen Hearing Screening - Comments:: &quot;Hears good&quot; Vision Screening - Comments:: Had cataract surgery a few years ago- [facility]  Dietary issues and exercise activities discussed: Current Exercise Habits: The patient does not participate in regular exercise at present, Exercise limited by: orthopedic condition(s)  (pain in knees)   Goals Addressed               This Visit's Progress     Weight (lb) < 200 lb (90.7 kg) (pt-stated)        Lose weight so I can have knee surgery       Depression Screen PHQ 2/9 Scores 12/02/2020 11/12/2020 09/01/2020 02/28/2020 11/29/2019 10/16/2019 06/12/2019  PHQ - 2 Score 0 0 0 0 2 3 0  PHQ- 9 Score 0 0 0 - 2 3 0    Fall Risk Fall Risk  12/02/2020 11/12/2020 09/01/2020 02/28/2020 11/29/2019  Falls in the past year? 0 0 0 - 0  Number falls in past yr: 0 0 0 0 0  Injury with Fall? 0 0 0 0 0  Comment - - - - -  Risk for fall due to : No Fall Risks No Fall Risks Impaired mobility - No Fall Risks  Risk for fall due to: Comment - - - - -  Follow up - Falls prevention discussed - Falls evaluation completed Falls prevention discussed    FALL RISK PREVENTION PERTAINING TO THE HOME:  Any stairs in or around the home? Yes  If so, are there any without handrails? Yes  Home free of loose throw rugs in walkways, pet beds, electrical cords, etc? No  Adequate lighting in your home to reduce risk of falls? Yes   ASSISTIVE DEVICES UTILIZED TO PREVENT FALLS:  Life alert? No  Use of a cane, walker or w/c? Yes  Grab bars in the bathroom? Yes  Shower chair or bench in shower? Yes  Elevated toilet seat or a handicapped toilet? Yes   Cognitive Function:Normal cognitive status assessed by direct observation by this Nurse Health Advisor. No abnormalities found.       6CIT Screen 11/29/2019 09/05/2018 09/01/2017 08/16/2016  What Year? 0 points 0 points 0 points 0 points  What month? 0 points 0 points 0 points 0 points  What time? 0 points 0 points 0 points 0 points  Count back from 20 0 points 0 points 4 points 0 points  Months in reverse 0 points 0 points 0 points 0 points  Repeat phrase 2 points 2 points 6 points 8 points  Total Score _0 Immunizations Immunization History  Administered Date(s) Administered   Fluad Quad(high Dose 65+) 10/26/2018,  10/16/2019    Influenza, High Dose Seasonal PF 10/04/2016, 10/12/2017   Influenza-Unspecified 10/26/2014, 09/27/2015, 10/26/2018   Moderna Sars-Covid-2 Vaccination 11/21/2019   PFIZER(Purple Top)SARS-COV-2 Vaccination 02/01/2019, 02/22/2019   Pneumococcal Conjugate-13 02/21/2013   Pneumococcal Polysaccharide-23 10/13/2011   Tdap 10/13/2011    TDAP status: Up to date  Flu Vaccine status: Up to date  Pneumococcal vaccine status: Up to date  Covid-19 vaccine status: Completed vaccines  Qualifies for Shingles Vaccine? Yes   Zostavax completed No   Shingrix Completed?: No.    Education has been provided regarding the importance of this vaccine. Patient has been advised to call insurance company to determine out of pocket expense if they have not yet received this vaccine. Advised may also receive vaccine at local pharmacy or Health Dept. Verbalized acceptance and understanding.  Screening Tests Health Maintenance  Topic Date Due   DEXA SCAN  05/19/2019   COVID-19 Vaccine (4 - Booster) 01/16/2020   Zoster Vaccines- Shingrix (1 of 2) 12/02/2020 (Originally 04/08/1964)   INFLUENZA VACCINE  04/24/2021 (Originally 08/25/2020)   MAMMOGRAM  10/07/2021   TETANUS/TDAP  10/12/2021   COLONOSCOPY (Pts 45-17yr Insurance coverage will need to be confirmed)  04/23/2025   Pneumonia Vaccine 75 Years old  Completed   Hepatitis C Screening  Completed   HPV VACCINES  Aged Out    Health Maintenance  Health Maintenance Due  Topic Date Due   DEXA SCAN  05/19/2019   COVID-19 Vaccine (4 - Booster) 01/16/2020    Colorectal cancer screening: Type of screening: Colonoscopy. Completed 04/23/2020. Repeat every 5 years  Mammogram status: Completed 10/07/2020. Repeat every year  Bone Density status: Completed 05/18/2017. Results reflect: Bone density results: OSTEOPENIA. Repeat every 2 years.  Lung Cancer Screening: (Low Dose CT Chest recommended if Age 370-80years, 30 pack-year currently smoking OR have quit w/in  15years.) does not qualify.    Additional Screening:  Hepatitis C Screening: does qualify; Completed 02/21/2013  Vision Screening: Recommended annual ophthalmology exams for early detection of glaucoma and other disorders of the eye. Is the patient up to date with their annual eye exam?  Yes  Who is the provider or what is the name of the office in which the patient attends annual eye exams? [facility].   Dental Screening: Recommended annual dental exams for proper oral hygiene  Community Resource Referral / Chronic Care Management: CRR required this visit?  No   CCM required this visit?  No      Plan:     I have personally reviewed and noted the following in the patient's chart:   Medical and social history Use of alcohol, tobacco or illicit drugs  Current medications and supplements including opioid prescriptions.  Functional ability and status Nutritional status Physical activity Advanced directives List of other physicians Hospitalizations, surgeries, and ER visits in previous 12 months Vitals Screenings to include cognitive, depression, and falls Referrals and appointments  In addition, I have reviewed and discussed with patient certain preventive protocols, quality metrics, and best practice recommendations. A written personalized care plan for preventive services as well as general preventive health recommendations were provided to patient.     LDionisio David  12/02/2020   Nurse Notes: none

## 2021-01-28 ENCOUNTER — Telehealth: Payer: PPO

## 2021-03-03 ENCOUNTER — Ambulatory Visit (INDEPENDENT_AMBULATORY_CARE_PROVIDER_SITE_OTHER): Payer: PPO | Admitting: Internal Medicine

## 2021-03-03 ENCOUNTER — Encounter: Payer: Self-pay | Admitting: Internal Medicine

## 2021-03-03 ENCOUNTER — Ambulatory Visit
Admission: RE | Admit: 2021-03-03 | Discharge: 2021-03-03 | Disposition: A | Discharge status: Home / Self Care | Payer: PPO | Source: Ambulatory Visit | Attending: Internal Medicine | Admitting: Internal Medicine

## 2021-03-03 VITALS — BP 128/76 | HR 74 | Temp 97.5°F | Resp 18 | Ht 62.0 in | Wt 274.8 lb

## 2021-03-03 DIAGNOSIS — M79661 Pain in right lower leg: Secondary | ICD-10-CM

## 2021-03-03 DIAGNOSIS — N1831 Chronic kidney disease, stage 3a: Secondary | ICD-10-CM | POA: Diagnosis not present

## 2021-03-03 DIAGNOSIS — S8991XA Unspecified injury of right lower leg, initial encounter: Secondary | ICD-10-CM | POA: Diagnosis not present

## 2021-03-03 DIAGNOSIS — M1711 Unilateral primary osteoarthritis, right knee: Secondary | ICD-10-CM | POA: Diagnosis not present

## 2021-03-03 DIAGNOSIS — M1A00X0 Idiopathic chronic gout, unspecified site, without tophus (tophi): Secondary | ICD-10-CM | POA: Diagnosis not present

## 2021-03-03 DIAGNOSIS — K219 Gastro-esophageal reflux disease without esophagitis: Secondary | ICD-10-CM

## 2021-03-03 DIAGNOSIS — I480 Paroxysmal atrial fibrillation: Secondary | ICD-10-CM | POA: Diagnosis not present

## 2021-03-03 DIAGNOSIS — Z23 Encounter for immunization: Secondary | ICD-10-CM

## 2021-03-03 DIAGNOSIS — X58XXXA Exposure to other specified factors, initial encounter: Secondary | ICD-10-CM | POA: Insufficient documentation

## 2021-03-03 DIAGNOSIS — M1990 Unspecified osteoarthritis, unspecified site: Secondary | ICD-10-CM | POA: Insufficient documentation

## 2021-03-03 DIAGNOSIS — I10 Essential (primary) hypertension: Secondary | ICD-10-CM

## 2021-03-03 DIAGNOSIS — E785 Hyperlipidemia, unspecified: Secondary | ICD-10-CM | POA: Diagnosis not present

## 2021-03-03 DIAGNOSIS — M7989 Other specified soft tissue disorders: Secondary | ICD-10-CM | POA: Diagnosis not present

## 2021-03-03 DIAGNOSIS — M19071 Primary osteoarthritis, right ankle and foot: Secondary | ICD-10-CM | POA: Diagnosis not present

## 2021-03-03 DIAGNOSIS — J449 Chronic obstructive pulmonary disease, unspecified: Secondary | ICD-10-CM

## 2021-03-03 LAB — CBC WITH DIFFERENTIAL/PLATELET
Absolute Monocytes: 448 cells/uL (ref 200–950)
Basophils Absolute: 47 cells/uL (ref 0–200)
Basophils Relative: 0.8 %
Eosinophils Absolute: 59 cells/uL (ref 15–500)
Eosinophils Relative: 1 %
HCT: 40 % (ref 35.0–45.0)
Hemoglobin: 13.3 g/dL (ref 11.7–15.5)
Lymphs Abs: 1971 cells/uL (ref 850–3900)
MCH: 30.3 pg (ref 27.0–33.0)
MCHC: 33.3 g/dL (ref 32.0–36.0)
MCV: 91.1 fL (ref 80.0–100.0)
MPV: 10.1 fL (ref 7.5–12.5)
Monocytes Relative: 7.6 %
Neutro Abs: 3375 cells/uL (ref 1500–7800)
Neutrophils Relative %: 57.2 %
Platelets: 355 10*3/uL (ref 140–400)
RBC: 4.39 10*6/uL (ref 3.80–5.10)
RDW: 12.3 % (ref 11.0–15.0)
Total Lymphocyte: 33.4 %
WBC: 5.9 10*3/uL (ref 3.8–10.8)

## 2021-03-03 LAB — COMPLETE METABOLIC PANEL WITH GFR
AG Ratio: 1 (calc) (ref 1.0–2.5)
ALT: 8 U/L (ref 6–29)
AST: 17 U/L (ref 10–35)
Albumin: 3.8 g/dL (ref 3.6–5.1)
Alkaline phosphatase (APISO): 112 U/L (ref 37–153)
BUN/Creatinine Ratio: 19 (calc) (ref 6–22)
BUN: 25 mg/dL (ref 7–25)
CO2: 29 mmol/L (ref 20–32)
Calcium: 9.9 mg/dL (ref 8.6–10.4)
Chloride: 99 mmol/L (ref 98–110)
Creat: 1.3 mg/dL — ABNORMAL HIGH (ref 0.60–1.00)
Globulin: 3.7 g/dL (calc) (ref 1.9–3.7)
Glucose, Bld: 106 mg/dL — ABNORMAL HIGH (ref 65–99)
Potassium: 4.3 mmol/L (ref 3.5–5.3)
Sodium: 135 mmol/L (ref 135–146)
Total Bilirubin: 0.4 mg/dL (ref 0.2–1.2)
Total Protein: 7.5 g/dL (ref 6.1–8.1)
eGFR: 43 mL/min/{1.73_m2} — ABNORMAL LOW (ref >=60)

## 2021-03-03 LAB — LIPID PANEL
Cholesterol: 172 mg/dL (ref <200)
HDL: 58 mg/dL (ref >=50)
LDL Cholesterol (Calc): 96 mg/dL (calc)
Non-HDL Cholesterol (Calc): 114 mg/dL (calc) (ref <130)
Total CHOL/HDL Ratio: 3 (calc) (ref <5.0)
Triglycerides: 87 mg/dL (ref <150)

## 2021-03-03 NOTE — Progress Notes (Signed)
Established Patient Office Visit  Subjective:  Patient ID: Colleen [name], female    DOB: 09-Mar-1945  Age: 76 y.o. MRN: [id number]  CC:  Chief Complaint  Patient presents with   Follow-up   Hypertension   Hyperlipidemia   Extremity Weakness    Right leg    HPI Colleen [name] presents for follow up on chronic medical conditions. Today she is complaining of right leg pain,located in her mid-calf. She first noticed the pain in December when she was taking out her trash; the pain came on suddenly in the middle of her calf and was sharp in nature. She then had swelling that radiated up into her knee but resolved after a few days of using compression and elevation. The pain eventually resolved but came back within the last few days, located in the same area. The pain does not radiate and then is no trauma, injury, skin changes, etc. She does have some mild chronic swelling. The pain is greatly affecting her mobility and strength.   Hypertension/A.Fib: -Medications: Enalapril 10, HCTZ 12.5, Eliquis 5 BID -Follows with Cardiology for A. Fib, has appointment in 2 weeks  -Patient is compliant with above medications and reports no side effects. -Checking BP at home (average): ran out of batteries in machine lately so last time checked last month,  -Denies any SOB, CP, vision changes, LE edema or symptoms of hypotension  HLD: -Medications: Lovastatin 40 -Patient is compliant with above medications and reports no side effects.  -Last lipid panel:  2/22 Lipid Panel     Component Value Date/Time   CHOL 157 02/28/2020 0905   CHOL 161 07/30/2014 0830   TRIG 105 02/28/2020 0905   HDL 58 02/28/2020 0905   HDL 82 07/30/2014 0830   CHOLHDL 2.7 02/28/2020 0905   VLDL 15 12/31/2015 0922   LDLCALC 80 02/28/2020 0905   LABVLDL 17 07/30/2014 0830    COPD: -COPD status: stable -Current medications: Symbicort and ProAir  -Satisfied with current treatment?: yes -Oxygen use: no -Dyspnea  frequency: no -Cough frequency: no -Rescue inhaler frequency: about once a month  -Limitation of activity: no -Productive cough: no -Pneumovax: Up to Date -Influenza: Up to Date  GERD: -Currently on Pepcid 20, symptoms well controlled   Gout: -Currently on Allopurinol 100 BID -Last gout flare: about 1 year ago  Health Maintenance: -Blood work up to date other than lipid panel -Mammogram 9/22: [facility] 1 -Colon cancer screening: colonoscopy March 2022, repeat in 5 years  Past Medical History:  Diagnosis Date   Asthma    Cataract    COPD (chronic obstructive pulmonary disease) ([place])    GERD (gastroesophageal reflux disease)    Gout    History of pulmonary embolus (PE)    Hyperlipidemia    Hypertension    Osteoarthrosis    Prediabetes     Past Surgical History:  Procedure Laterality Date   BREAST BIOPSY Left 04/08/2014   intraductal papilloma 5 mm   COLONOSCOPY WITH PROPOFOL N/A 04/23/2020   Procedure: COLONOSCOPY WITH PROPOFOL;  Surgeon: Virgel Manifold, MD;  Location: [facility] ENDOSCOPY;  Service: Endoscopy;  Laterality: N/A;   ESOPHAGOGASTRODUODENOSCOPY (EGD) WITH PROPOFOL N/A 04/23/2020   Procedure: ESOPHAGOGASTRODUODENOSCOPY (EGD) WITH PROPOFOL;  Surgeon: Virgel Manifold, MD;  Location: [facility] ENDOSCOPY;  Service: Endoscopy;  Laterality: N/A;   LUMBAR LAMINECTOMY     TONSILLECTOMY      Family History  Problem Relation Age of Onset   Diabetes Mother    Lung cancer Father  Diabetes Sister    Congestive Heart Failure Sister    Glaucoma Sister    Breast cancer Neg Hx     Social History   Socioeconomic History   Marital status: Widowed    Spouse name: Titus Dubin   Number of children: 0   Years of education: Not on file   Highest education level: 12th grade  Occupational History   Occupation: Retired  Tobacco Use   Smoking status: Former    Packs/day: 1.50    Years: 20.00    Pack years: 30.00    Types: Cigarettes    Quit date: 11/13/2008    Years  since quitting: 12.3   Smokeless tobacco: Never   Tobacco comments:    smoking cessation materials not required  Vaping Use   Vaping Use: Never used  Substance and Sexual Activity   Alcohol use: Not Currently   Drug use: No   Sexual activity: Not Currently  Other Topics Concern   Not on file  Social History Narrative   Pt lives alone   Social Determinants of Health   Financial Resource Strain: Low Risk    Difficulty of Paying Living Expenses: Not hard at all  Food Insecurity: Not on file  Transportation Needs: Not on file  Physical Activity: Not on file  Stress: Not on file  Social Connections: Not on file  Intimate Partner Violence: Not on file    Outpatient Medications Prior to Visit  Medication Sig Dispense Refill   acetaminophen (TYLENOL) 650 MG CR tablet Take 650 mg by mouth every 8 (eight) hours as needed for pain.     allopurinol (ZYLOPRIM) 100 MG tablet Take 2 tablets (200 mg total) by mouth daily. 180 tablet 3   Blood Pressure Monitoring (BLOOD PRESSURE KIT) DEVI 1 Device by Does not apply route daily. Fluctuating blood pressure; please get appropriate size, large cuff 1 Device 0   cholecalciferol (VITAMIN D3) 25 MCG (1000 UNIT) tablet Take 1,000 Units by mouth daily.     ELIQUIS 5 MG TABS tablet Take 5 mg by mouth 2 (two) times daily.     enalapril (VASOTEC) 10 MG tablet Take 1 tablet (10 mg total) by mouth daily. 90 tablet 1   famotidine (PEPCID) 20 MG tablet Take 20 mg by mouth daily as needed for heartburn or indigestion.     ferrous sulfate 325 (65 FE) MG EC tablet Take 1 tablet (325 mg total) by mouth daily. 90 tablet 1   hydrochlorothiazide (MICROZIDE) 12.5 MG capsule Take 1 capsule (12.5 mg total) by mouth daily. 90 capsule 3   lovastatin (MEVACOR) 40 MG tablet Take 1 tablet (40 mg total) by mouth at bedtime. 90 tablet 3   Multiple Vitamins-Minerals (MULTIVITAMIN ADULTS 50+) TABS Take 1 tablet by mouth daily. (Patient not taking: Reported on 12/02/2020)      mupirocin cream (BACTROBAN) 2 % Apply 1 application topically 2 (two) times daily. 15 g 1   PROAIR HFA 108 (90 Base) MCG/ACT inhaler INHALE 2 PUFFS INTO LUNGS EVERY 4 HOURS AS NEEDED FOR WHEEZING FOR SHORTNESS OF BREATH 18 g 5   simethicone (GAS-X) 80 MG chewable tablet Chew 1 tablet (80 mg total) by mouth 2 (two) times a day. 60 tablet 2   SYMBICORT 160-4.5 MCG/ACT inhaler Inhale 2 puffs into the lungs 2 (two) times daily. 33 g 5   triamcinolone (KENALOG) 0.1 % APPLY CREAM EXTERNALLY TO AFFECTED AREA TWICE DAILY if needed; not more than one week at a time, then take a  break for two weeks 80 g 0   No facility-administered medications prior to visit.    No Known Allergies  ROS Review of Systems  Constitutional:  Negative for chills and fever.  Eyes:  Negative for visual disturbance.  Respiratory:  Negative for cough and shortness of breath.   Cardiovascular:  Positive for leg swelling. Negative for chest pain and palpitations.  Gastrointestinal:  Negative for abdominal pain, nausea and vomiting.  Musculoskeletal:  Positive for gait problem.  Skin: Negative.   Neurological:  Positive for weakness. Negative for dizziness.     Objective:    Physical Exam Constitutional:      Appearance: Normal appearance.  HENT:     Head: Normocephalic and atraumatic.  Eyes:     Conjunctiva/sclera: Conjunctivae normal.  Cardiovascular:     Rate and Rhythm: Normal rate and regular rhythm.  Pulmonary:     Effort: Pulmonary effort is normal.     Breath sounds: Normal breath sounds.  Musculoskeletal:        General: Swelling and tenderness present. No signs of injury.     Right lower leg: No edema.     Left lower leg: No edema.     Comments: Point tenderness over anterior aspect of right calf with mild chronic swelling present bilaterally  Skin:    General: Skin is warm and dry.  Neurological:     General: No focal deficit present.     Mental Status: She is alert. Mental status is at baseline.   Psychiatric:        Mood and Affect: Mood normal.        Behavior: Behavior normal.    BP 128/76    Pulse 74    Temp (!) 97.5 F (36.4 C)    Resp 18    Ht '5\' 2"'  (1.575 m)    Wt 274 lb 12.8 oz (124.6 kg)    SpO2 96%    BMI 50.26 kg/m  Wt Readings from Last 3 Encounters:  03/03/21 274 lb 12.8 oz (124.6 kg)  11/12/20 274 lb 8 oz (124.5 kg)  10/20/20 277 lb (125.6 kg)     Health Maintenance Due  Topic Date Due   Zoster Vaccines- Shingrix (1 of 2) Never done   DEXA SCAN  05/19/2019   COVID-19 Vaccine (4 - Booster) 01/16/2020    There are no preventive care reminders to display for this patient.  Lab Results  Component Value Date   TSH 1.62 03/06/2019   Lab Results  Component Value Date   WBC 6.8 09/01/2020   HGB 13.6 09/01/2020   HCT 40.7 09/01/2020   MCV 89.1 09/01/2020   PLT 318 09/01/2020   Lab Results  Component Value Date   NA 136 09/01/2020   K 4.3 09/01/2020   CO2 30 09/01/2020   GLUCOSE 94 09/01/2020   BUN 26 (H) 09/01/2020   CREATININE 1.10 (H) 09/01/2020   BILITOT 0.5 09/01/2020   ALKPHOS 108 09/10/2018   AST 15 09/01/2020   ALT 8 09/01/2020   PROT 7.4 09/01/2020   ALBUMIN 3.5 09/10/2018   CALCIUM 10.0 09/01/2020   ANIONGAP 9 02/10/2019   EGFR 53 (L) 09/01/2020   Lab Results  Component Value Date   CHOL 157 02/28/2020   Lab Results  Component Value Date   HDL 58 02/28/2020   Lab Results  Component Value Date   LDLCALC 80 02/28/2020   Lab Results  Component Value Date   TRIG 105 02/28/2020   Lab  Results  Component Value Date   CHOLHDL 2.7 02/28/2020   Lab Results  Component Value Date   HGBA1C 6.0 (H) 09/01/2020      Assessment & Plan:   Problem List Items Addressed This Visit       Cardiovascular and Mediastinum   Essential hypertension    Stable, continue current medications.       Relevant Orders   CBC w/Diff/Platelet   COMPLETE METABOLIC PANEL WITH GFR   Atrial fibrillation (HCC)    Stable, regular rate and rhythm  on physical exam today, on Eliquis and following with Cardiology.         Respiratory   COPD (chronic obstructive pulmonary disease) (HCC) (Chronic)    Stable, continue current inhalers.         Digestive   GERD (gastroesophageal reflux disease)    Stable, had EGD last year but did not follow up on h. Pylori test due to mobility issues. Continue Pepcid.         Musculoskeletal and Integument   Chronic gouty arthropathy without tophi    Stable, continue Allopurinol.         Genitourinary   Stage 3 chronic kidney disease (HCC)    Stable, recheck labs today.        Other   Morbid obesity (HCC) (Chronic)    BMI still high, hard for her to exercise/walk due to mobility issues.       HLD (hyperlipidemia)    Stable, continue statin. Recheck lipid panel today.       Relevant Orders   Lipid Profile   Other Visit Diagnoses     Right calf pain    -  Primary; uncertain etiology, will obtain x-ray to rule out stress fracture. Does have a history of osteopenia and OA in both knees.    Relevant Orders   DG Tibia/Fibula Right   Vaccine for streptococcus pneumoniae and influenza       Relevant Orders   Pneumococcal conjugate vaccine 20-valent (Prevnar 20)         Follow-up: Return in about 6 months (around 08/31/2021).    Teodora Medici, DO

## 2021-03-03 NOTE — Assessment & Plan Note (Signed)
-  Stable, continue current inhalers.  

## 2021-03-03 NOTE — Assessment & Plan Note (Signed)
BMI still high, hard for her to exercise/walk due to mobility issues.

## 2021-03-03 NOTE — Assessment & Plan Note (Signed)
Stable, continue Allopurinol.  

## 2021-03-03 NOTE — Patient Instructions (Addendum)
It was great seeing you today!  Plan discussed at today's visit: -Blood work ordered today, results will be uploaded to [facility].  -X-ray of right leg today -Pneumonia vaccine vaccine today  Follow up in: 6 months   Take care and let us know if you have any questions or concerns prior to your next visit.  Dr. Rosana Berger

## 2021-03-03 NOTE — Assessment & Plan Note (Signed)
Stable, continue current medications.  

## 2021-03-03 NOTE — Assessment & Plan Note (Signed)
Stable, had EGD last year but did not follow up on h. Pylori test due to mobility issues. Continue Pepcid.

## 2021-03-03 NOTE — Assessment & Plan Note (Signed)
Stable, recheck labs today.

## 2021-03-03 NOTE — Assessment & Plan Note (Signed)
Stable, continue statin. Recheck lipid panel today.

## 2021-03-03 NOTE — Assessment & Plan Note (Signed)
Stable, regular rate and rhythm on physical exam today, on Eliquis and following with Cardiology.

## 2021-03-04 ENCOUNTER — Other Ambulatory Visit: Payer: Self-pay

## 2021-03-04 ENCOUNTER — Telehealth: Payer: Self-pay | Admitting: *Deleted

## 2021-03-04 ENCOUNTER — Telehealth: Payer: Self-pay

## 2021-03-04 DIAGNOSIS — M1710 Unilateral primary osteoarthritis, unspecified knee: Secondary | ICD-10-CM

## 2021-03-04 NOTE — Telephone Encounter (Signed)
Copied from [place] [phone]. Topic: General - Other >> Mar 04, 2021 10:44 AM Tessa Lerner A wrote: Reason for CRM: The patient has returned missed call from Rosanne Sack. and would like to speak with staff member again when possible   The patient is not and has not seen an orthopedic specialist for their knee  Please contact further when possible

## 2021-03-04 NOTE — Telephone Encounter (Signed)
Pt.notified

## 2021-03-04 NOTE — Telephone Encounter (Signed)
Pt calling you back . Please give her a call per call center

## 2021-03-04 NOTE — Telephone Encounter (Signed)
Patient called for lab results- per agent nurse not available in office. Patient notified per PCP notes(no changes)  No signs of infection in the blood work, kidney function decreased slightly, recommend she increase her water intake. LDL (bad cholesterol) increased slightly to 96, be careful with red meats and fatty processed foods.

## 2021-03-09 DIAGNOSIS — M25561 Pain in right knee: Secondary | ICD-10-CM | POA: Diagnosis not present

## 2021-03-09 DIAGNOSIS — M1711 Unilateral primary osteoarthritis, right knee: Secondary | ICD-10-CM | POA: Diagnosis not present

## 2021-04-14 DIAGNOSIS — I2699 Other pulmonary embolism without acute cor pulmonale: Secondary | ICD-10-CM | POA: Diagnosis not present

## 2021-04-14 DIAGNOSIS — D509 Iron deficiency anemia, unspecified: Secondary | ICD-10-CM | POA: Diagnosis not present

## 2021-04-14 DIAGNOSIS — Z7901 Long term (current) use of anticoagulants: Secondary | ICD-10-CM | POA: Diagnosis not present

## 2021-04-23 DIAGNOSIS — Z7901 Long term (current) use of anticoagulants: Secondary | ICD-10-CM | POA: Diagnosis not present

## 2021-04-23 DIAGNOSIS — Z79899 Other long term (current) drug therapy: Secondary | ICD-10-CM | POA: Diagnosis not present

## 2021-04-23 DIAGNOSIS — Z6841 Body Mass Index (BMI) 40.0 and over, adult: Secondary | ICD-10-CM | POA: Diagnosis not present

## 2021-04-23 DIAGNOSIS — M545 Low back pain, unspecified: Secondary | ICD-10-CM | POA: Diagnosis not present

## 2021-04-23 DIAGNOSIS — I10 Essential (primary) hypertension: Secondary | ICD-10-CM | POA: Diagnosis not present

## 2021-04-23 DIAGNOSIS — Z86711 Personal history of pulmonary embolism: Secondary | ICD-10-CM | POA: Diagnosis not present

## 2021-04-23 DIAGNOSIS — S300XXA Contusion of lower back and pelvis, initial encounter: Secondary | ICD-10-CM | POA: Diagnosis not present

## 2021-04-23 DIAGNOSIS — J449 Chronic obstructive pulmonary disease, unspecified: Secondary | ICD-10-CM | POA: Diagnosis not present

## 2021-04-23 DIAGNOSIS — M170 Bilateral primary osteoarthritis of knee: Secondary | ICD-10-CM | POA: Diagnosis not present

## 2021-04-23 DIAGNOSIS — Z87891 Personal history of nicotine dependence: Secondary | ICD-10-CM | POA: Diagnosis not present

## 2021-04-23 DIAGNOSIS — G8929 Other chronic pain: Secondary | ICD-10-CM | POA: Diagnosis not present

## 2021-04-23 DIAGNOSIS — M533 Sacrococcygeal disorders, not elsewhere classified: Secondary | ICD-10-CM | POA: Diagnosis not present

## 2021-04-23 DIAGNOSIS — E785 Hyperlipidemia, unspecified: Secondary | ICD-10-CM | POA: Diagnosis not present

## 2021-05-17 ENCOUNTER — Other Ambulatory Visit: Payer: Self-pay | Admitting: Family Medicine

## 2021-05-17 DIAGNOSIS — L309 Dermatitis, unspecified: Secondary | ICD-10-CM

## 2021-05-22 ENCOUNTER — Other Ambulatory Visit: Payer: Self-pay | Admitting: Family Medicine

## 2021-05-22 DIAGNOSIS — I700 Atherosclerosis of aorta: Secondary | ICD-10-CM

## 2021-05-22 DIAGNOSIS — E782 Mixed hyperlipidemia: Secondary | ICD-10-CM

## 2021-06-26 DIAGNOSIS — M6530 Trigger finger, unspecified finger: Secondary | ICD-10-CM | POA: Insufficient documentation

## 2021-06-26 DIAGNOSIS — M65341 Trigger finger, right ring finger: Secondary | ICD-10-CM | POA: Diagnosis not present

## 2021-06-26 DIAGNOSIS — M65331 Trigger finger, right middle finger: Secondary | ICD-10-CM | POA: Diagnosis not present

## 2021-07-11 ENCOUNTER — Telehealth: Payer: Self-pay | Admitting: Family Medicine

## 2021-07-11 DIAGNOSIS — J441 Chronic obstructive pulmonary disease with (acute) exacerbation: Secondary | ICD-10-CM

## 2021-07-14 NOTE — Telephone Encounter (Signed)
Wells Guiles called from the pharmacy reporting that the Proair is no longer available. She is requesting for an alternative that is covered by the patient's insurance. Please advise

## 2021-07-15 MED ORDER — ALBUTEROL SULFATE 108 (90 BASE) MCG/ACT IN AEPB: 2.0000 | INHALATION_SPRAY | Freq: Four times a day (QID) | RESPIRATORY_TRACT | 4 refills | Status: DC | PRN

## 2021-07-15 NOTE — Addendum Note (Signed)
Addended by: Delsa Grana on: 07/15/2021 05:33 PM   Modules accepted: Orders

## 2021-07-16 NOTE — Telephone Encounter (Signed)
Pt has been informed, verbalizes understanding.

## 2021-07-24 DIAGNOSIS — M65341 Trigger finger, right ring finger: Secondary | ICD-10-CM | POA: Diagnosis not present

## 2021-07-24 DIAGNOSIS — M65331 Trigger finger, right middle finger: Secondary | ICD-10-CM | POA: Diagnosis not present

## 2021-08-05 ENCOUNTER — Other Ambulatory Visit: Payer: Self-pay

## 2021-08-05 DIAGNOSIS — J441 Chronic obstructive pulmonary disease with (acute) exacerbation: Secondary | ICD-10-CM

## 2021-08-05 MED ORDER — ALBUTEROL SULFATE HFA 108 (90 BASE) MCG/ACT IN AERS: 2.0000 | INHALATION_SPRAY | RESPIRATORY_TRACT | 2 refills | Status: DC | PRN

## 2021-08-16 ENCOUNTER — Other Ambulatory Visit: Payer: Self-pay | Admitting: Family Medicine

## 2021-08-16 DIAGNOSIS — I10 Essential (primary) hypertension: Secondary | ICD-10-CM

## 2021-08-17 ENCOUNTER — Other Ambulatory Visit: Payer: Self-pay

## 2021-08-17 DIAGNOSIS — I10 Essential (primary) hypertension: Secondary | ICD-10-CM

## 2021-08-17 MED ORDER — HYDROCHLOROTHIAZIDE 12.5 MG PO CAPS: 12.5000 mg | ORAL_CAPSULE | Freq: Every day | ORAL | 0 refills | Status: DC

## 2021-08-19 ENCOUNTER — Other Ambulatory Visit: Payer: Self-pay | Admitting: Family Medicine

## 2021-08-19 DIAGNOSIS — I10 Essential (primary) hypertension: Secondary | ICD-10-CM

## 2021-08-23 ENCOUNTER — Other Ambulatory Visit: Payer: Self-pay

## 2021-08-23 ENCOUNTER — Emergency Department: Payer: PPO

## 2021-08-23 DIAGNOSIS — I959 Hypotension, unspecified: Secondary | ICD-10-CM | POA: Diagnosis not present

## 2021-08-23 DIAGNOSIS — R0981 Nasal congestion: Secondary | ICD-10-CM | POA: Diagnosis not present

## 2021-08-23 DIAGNOSIS — I10 Essential (primary) hypertension: Secondary | ICD-10-CM | POA: Diagnosis not present

## 2021-08-23 DIAGNOSIS — Z79899 Other long term (current) drug therapy: Secondary | ICD-10-CM | POA: Diagnosis not present

## 2021-08-23 DIAGNOSIS — R531 Weakness: Secondary | ICD-10-CM | POA: Diagnosis not present

## 2021-08-23 DIAGNOSIS — R55 Syncope and collapse: Secondary | ICD-10-CM | POA: Diagnosis not present

## 2021-08-23 LAB — URINALYSIS, ROUTINE W REFLEX MICROSCOPIC
Bilirubin Urine: NEGATIVE
Glucose, UA: NEGATIVE mg/dL
Hgb urine dipstick: NEGATIVE
Ketones, ur: NEGATIVE mg/dL
Leukocytes,Ua: NEGATIVE
Nitrite: NEGATIVE
Protein, ur: NEGATIVE mg/dL
Specific Gravity, Urine: 1.018 (ref 1.005–1.030)
pH: 5 (ref 5.0–8.0)

## 2021-08-23 LAB — CBC
HCT: 35.9 % — ABNORMAL LOW (ref 36.0–46.0)
Hemoglobin: 11.4 g/dL — ABNORMAL LOW (ref 12.0–15.0)
MCH: 29.7 pg (ref 26.0–34.0)
MCHC: 31.8 g/dL (ref 30.0–36.0)
MCV: 93.5 fL (ref 80.0–100.0)
Platelets: 430 10*3/uL — ABNORMAL HIGH (ref 150–400)
RBC: 3.84 MIL/uL — ABNORMAL LOW (ref 3.87–5.11)
RDW: 14 % (ref 11.5–15.5)
WBC: 9.5 10*3/uL (ref 4.0–10.5)
nRBC: 0 % (ref 0.0–0.2)

## 2021-08-23 LAB — COMPREHENSIVE METABOLIC PANEL
ALT: 13 U/L (ref 0–44)
AST: 20 U/L (ref 15–41)
Albumin: 3.7 g/dL (ref 3.5–5.0)
Alkaline Phosphatase: 96 U/L (ref 38–126)
Anion gap: 9 (ref 5–15)
BUN: 29 mg/dL — ABNORMAL HIGH (ref 8–23)
CO2: 26 mmol/L (ref 22–32)
Calcium: 9.5 mg/dL (ref 8.9–10.3)
Chloride: 101 mmol/L (ref 98–111)
Creatinine, Ser: 1.3 mg/dL — ABNORMAL HIGH (ref 0.44–1.00)
GFR, Estimated: 43 mL/min — ABNORMAL LOW (ref >60)
Glucose, Bld: 107 mg/dL — ABNORMAL HIGH (ref 70–99)
Potassium: 4.1 mmol/L (ref 3.5–5.1)
Sodium: 136 mmol/L (ref 135–145)
Total Bilirubin: 0.6 mg/dL (ref 0.3–1.2)
Total Protein: 8.1 g/dL (ref 6.5–8.1)

## 2021-08-23 LAB — TROPONIN I (HIGH SENSITIVITY)
Troponin I (High Sensitivity): 12 ng/L (ref <18)
Troponin I (High Sensitivity): 13 ng/L (ref <18)

## 2021-08-23 NOTE — ED Triage Notes (Signed)
Pt states has been having chest congestion, feeling like she is going to pass out. Pt denies shob, denies chest pain. Pt states she feels lightheaded.

## 2021-08-24 ENCOUNTER — Emergency Department
Admission: EM | Admit: 2021-08-24 | Discharge: 2021-08-24 | Disposition: A | Discharge status: Home / Self Care | Payer: PPO | Attending: Emergency Medicine | Admitting: Emergency Medicine

## 2021-08-24 DIAGNOSIS — R531 Weakness: Secondary | ICD-10-CM

## 2021-08-24 NOTE — ED Provider Notes (Signed)
[facility] Provider Note    Event Date/Time   First MD Initiated Contact with Patient 08/24/21 0103     (approximate)   History   Near Syncope   HPI  Colleen [name] is a 76 y.o. female  who presents to the emergency department today because of concern for feeling bad and phlegm. The patient was lying in bed watching TV when all of a sudden she felt bad.  She stated that she had to get some phlegm out.  She felt congested.  Felt generalized weakness.  She denies any chest pain with the symptoms.  Denies any shortness of breath.  Denies any fevers.  Physical Exam   Triage Vital Signs: ED Triage Vitals  Enc Vitals Group     BP 08/23/21 2024 (!) 107/96     Pulse Rate 08/23/21 2024 (!) 108     Resp 08/23/21 2024 16     Temp 08/23/21 2024 98.2 F (36.8 C)     Temp Source 08/23/21 2024 Oral     SpO2 08/23/21 2024 100 %     Weight 08/23/21 2024 250 lb (113.4 kg)     Height 08/23/21 2024 '5\' 7"'$  (1.702 m)     Head Circumference --      Peak Flow --      Pain Score 08/23/21 2024 0     Pain Loc --      Pain Edu? --      Excl. in [place]? --     Most recent vital signs: Vitals:   08/23/21 2024 08/23/21 2211  BP: (!) 107/96 (!) 98/58  Pulse: (!) 108 84  Resp: 16 16  Temp: 98.2 F (36.8 C) 99 F (37.2 C)  SpO2: 100% 100%    General: Awake, alert, oriented. CV:  Good peripheral perfusion. Regular rate and rhythm. Resp:  Normal effort. Lungs clear. Abd:  No distention.    ED Results / Procedures / Treatments   Labs (all labs ordered are listed, but only abnormal results are displayed) Labs Reviewed  CBC - Abnormal; Notable for the following components:      Result Value   RBC 3.84 (*)    Hemoglobin 11.4 (*)    HCT 35.9 (*)    Platelets 430 (*)    All other components within normal limits  URINALYSIS, ROUTINE W REFLEX MICROSCOPIC - Abnormal; Notable for the following components:   Color, Urine YELLOW (*)    APPearance HAZY (*)    All other  components within normal limits  COMPREHENSIVE METABOLIC PANEL - Abnormal; Notable for the following components:   Glucose, Bld 107 (*)    BUN 29 (*)    Creatinine, Ser 1.30 (*)    GFR, Estimated 43 (*)    All other components within normal limits  TROPONIN I (HIGH SENSITIVITY)  TROPONIN I (HIGH SENSITIVITY)     EKG  I, Nance Pear, attending physician, personally viewed and interpreted this EKG  EKG Time: 2028 Rate: 96 Rhythm: normal sinus rhythm Axis: left axis deviation Intervals: qtc 429 QRS: narrow, q waves v1 ST changes: no st elevation Impression: abnormal ekg   RADIOLOGY I independently interpreted and visualized the CXR. My interpretation: No pneumonia. No pneumothorax.  Radiology interpretation:  IMPRESSION:  No active cardiopulmonary disease.     PROCEDURES:  Critical Care performed: No  Procedures   MEDICATIONS ORDERED IN ED: Medications - No data to display   IMPRESSION / MDM / [place] / ED  COURSE  I reviewed the triage vital signs and the nursing notes.                              Differential diagnosis includes, but is not limited to, arrhythmia, anemia, electrolyte abnormality, ACS.  Patient's presentation is most consistent with acute presentation with potential threat to life or bodily function.  Patient presents to the emergency department today because of concerns for an episode where she felt like she was weak and might pass out as well as having some phlegm.  Time my exam patient states she feels better.  While here in the emergency department she did have 1 vital sign check that showed some hypotension.  Patient states she did take her hypertensive medication today.  Blood work without any elevated troponin.  Chest x-ray without any pneumonia.  This time given that patient feels improvement and repeat blood pressure also showed improvement I think is reasonable for patient be discharged.  Encouraged follow-up with primary  care physician.  FINAL CLINICAL IMPRESSION(S) / ED DIAGNOSES   Final diagnoses:  Weakness      Note:  This document was prepared using Dragon voice recognition software and may include unintentional dictation errors.    Nance Pear, MD 08/24/21 [phone]

## 2021-08-24 NOTE — Discharge Instructions (Signed)
Please seek medical attention for any high fevers, chest pain, shortness of breath, change in behavior, persistent vomiting, bloody stool or any other new or concerning symptoms.  

## 2021-09-01 ENCOUNTER — Encounter: Payer: Self-pay | Admitting: Internal Medicine

## 2021-09-01 ENCOUNTER — Ambulatory Visit (INDEPENDENT_AMBULATORY_CARE_PROVIDER_SITE_OTHER): Payer: PPO | Admitting: Internal Medicine

## 2021-09-01 VITALS — BP 146/74 | HR 94 | Temp 98.0°F | Resp 16 | Ht 62.0 in | Wt 249.0 lb

## 2021-09-01 DIAGNOSIS — J069 Acute upper respiratory infection, unspecified: Secondary | ICD-10-CM | POA: Diagnosis not present

## 2021-09-01 DIAGNOSIS — N1831 Chronic kidney disease, stage 3a: Secondary | ICD-10-CM | POA: Diagnosis not present

## 2021-09-01 DIAGNOSIS — I10 Essential (primary) hypertension: Secondary | ICD-10-CM | POA: Diagnosis not present

## 2021-09-01 DIAGNOSIS — M109 Gout, unspecified: Secondary | ICD-10-CM | POA: Diagnosis not present

## 2021-09-01 DIAGNOSIS — J449 Chronic obstructive pulmonary disease, unspecified: Secondary | ICD-10-CM | POA: Diagnosis not present

## 2021-09-01 DIAGNOSIS — D509 Iron deficiency anemia, unspecified: Secondary | ICD-10-CM | POA: Diagnosis not present

## 2021-09-01 DIAGNOSIS — R7303 Prediabetes: Secondary | ICD-10-CM

## 2021-09-01 DIAGNOSIS — K219 Gastro-esophageal reflux disease without esophagitis: Secondary | ICD-10-CM

## 2021-09-01 DIAGNOSIS — E785 Hyperlipidemia, unspecified: Secondary | ICD-10-CM | POA: Diagnosis not present

## 2021-09-01 DIAGNOSIS — M170 Bilateral primary osteoarthritis of knee: Secondary | ICD-10-CM

## 2021-09-01 MED ORDER — ENALAPRIL MALEATE 5 MG PO TABS: 5.0000 mg | ORAL_TABLET | Freq: Every day | ORAL | 0 refills | Status: DC

## 2021-09-01 MED ORDER — ALLOPURINOL 100 MG PO TABS
200.0000 mg | ORAL_TABLET | Freq: Every day | ORAL | 3 refills | Status: DC
Start: 2021-09-01 — End: 2022-09-20

## 2021-09-01 MED ORDER — ADULT BLOOD PRESSURE CUFF LG KIT: 1.0000 | PACK | Freq: Every day | 0 refills | Status: DC

## 2021-09-01 MED ORDER — METHYLPREDNISOLONE 4 MG PO TBPK: ORAL_TABLET | ORAL | 0 refills | Status: DC

## 2021-09-01 NOTE — Progress Notes (Signed)
Established Patient Office Visit  Subjective:  Patient ID: Colleen [name], female    DOB: 05-17-1945  Age: 76 y.o. MRN: [id number]  CC:  Chief Complaint  Patient presents with   Follow-up   Hyperlipidemia   Hypertension    Pt states has been taking Colleen [name] enalapril half only    HPI Colleen [name] presents for follow up on chronic medical conditions. Colleen [name] was also recently seen in the ER for near syncope, weakness and congestion. Blood pressure was low at 107/96 so Colleen [name] starting cutting Colleen [name] Enalapril in half (5 mg). Chest x-ray normal. Labs showed hgb 11.4, creatinine 1.30   URI Compliant:  -Worst symptom: chest congestion - started about 2-3 weeks ago -Fever: no -Cough: yes light green  -Shortness of breath: yes -Wheezing: no -Chest congestion: yes -Nasal congestion: yes -Runny nose: no -Post nasal drip: no -Sore throat: no -Sinus pressure: no -Headache: no -Relief with OTC cold/cough medications: no  -Treatments attempted: Robitussin    Hypertension/A.Fib: -Medications: Enalapril 10 (taking half currently but hasn't taken it yet today), HCTZ 12.5, Eliquis 5 BID -Follows with Cardiology for A. Fib, has appointment in October -Patient is compliant with above medications and reports no side effects. -Checking BP at home (average): has a cuff but it is too small, needs a larger cuff -Denies any SOB, CP, vision changes, LE edema or symptoms of hypotension  HLD: -Medications: Lovastatin 40 mg -Patient is compliant with above medications and reports no side effects.  -Last lipid panel:  2/23 Lipid Panel     Component Value Date/Time   CHOL 172 03/03/2021 0852   CHOL 161 07/30/2014 0830   TRIG 87 03/03/2021 0852   HDL 58 03/03/2021 0852   HDL 82 07/30/2014 0830   CHOLHDL 3.0 03/03/2021 0852   VLDL 15 12/31/2015 0922   LDLCALC 96 03/03/2021 0852   LABVLDL 17 07/30/2014 0830    COPD: -COPD status: stable -Current medications: Symbicort and ProAir  -Satisfied with  current treatment?: yes -Oxygen use: no -Dyspnea frequency: no -Cough frequency: no -Rescue inhaler frequency: about once a month  -Limitation of activity: no -Productive cough: no -Pneumovax: Up to Date -Influenza: Up to Date  GERD: -Currently on Pepcid 20 mg daily  -Had EGD 3/22 which showed mucosa suspicious for Barrett's esophagus, paraesophageal hernia, stomach nodule, 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 Plan at that time was for a small bowel capsule study should Colleen [name] anemia continue, however the patient states Colleen [name] never followed up because of Colleen [name] arthritis in Colleen [name] knees, Colleen [name] cannot make it into the office.  -Hgb in the ER 11.4, is taking ferrous sulfate 325 mg daily.   Gout: -Currently on Allopurinol 100 BID -Last gout flare: >1 year ago  Health Maintenance: -Blood work due   -Mammogram 9/22: Birads 1 -Colon cancer screening: colonoscopy March 2022, repeat in 5 years  Past Medical History:  Diagnosis Date   Asthma    Cataract    COPD (chronic obstructive pulmonary disease) (HCC)    GERD (gastroesophageal reflux disease)    Gout    History of pulmonary embolus (PE)    Hyperlipidemia    Hypertension    Osteoarthrosis    Prediabetes     Past Surgical History:  Procedure Laterality Date   BREAST BIOPSY Left 04/08/2014   intraductal papilloma 5 mm   COLONOSCOPY WITH PROPOFOL N/A 04/23/2020   Procedure: COLONOSCOPY WITH PROPOFOL;  Surgeon: Virgel Manifold, MD;  Location: [facility] ENDOSCOPY;  Service:  Endoscopy;  Laterality: N/A;   ESOPHAGOGASTRODUODENOSCOPY (EGD) WITH PROPOFOL N/A 04/23/2020   Procedure: ESOPHAGOGASTRODUODENOSCOPY (EGD) WITH PROPOFOL;  Surgeon: Virgel Manifold, MD;  Location: [facility] ENDOSCOPY;  Service: Endoscopy;  Laterality: N/A;   LUMBAR LAMINECTOMY     TONSILLECTOMY      Family History  Problem Relation Age of Onset   Diabetes Mother    Lung cancer Father    Diabetes Sister    Congestive Heart Failure  Sister    Glaucoma Sister    Breast cancer Neg Hx     Social History   Socioeconomic History   Marital status: Widowed    Spouse name: Colleen [name]   Number of children: 0   Years of education: Not on file   Highest education level: 12th grade  Occupational History   Occupation: Retired  Tobacco Use   Smoking status: Former    Packs/day: 1.50    Years: 20.00    Total pack years: 30.00    Types: Cigarettes    Quit date: 11/13/2008    Years since quitting: 12.8   Smokeless tobacco: Never   Tobacco comments:    smoking cessation materials not required  Vaping Use   Vaping Use: Never used  Substance and Sexual Activity   Alcohol use: Not Currently   Drug use: No   Sexual activity: Not Currently  Other Topics Concern   Not on file  Social History Narrative   Pt lives alone   Social Determinants of Health   Financial Resource Strain: Low Risk  (07/18/2020)   Overall Financial Resource Strain (CARDIA)    Difficulty of Paying Living Expenses: Not hard at all  Food Insecurity: No [place] (11/29/2019)   Hunger Vital Sign    Worried About Running Out of Food in the Last Year: Never true    [place] in the Last Year: Never true  Transportation Needs: No Transportation Needs (11/29/2019)   PRAPARE - Hydrologist (Medical): No    Lack of Transportation (Non-Medical): No  Physical Activity: Inactive (11/29/2019)   Exercise Vital Sign    Days of Exercise per Week: 0 days    Minutes of Exercise per Session: 0 min  Stress: No Stress Concern Present (11/29/2019)   [place]    Feeling of Stress : Not at all  Social Connections: Moderately Isolated (11/29/2019)   Social Connection and Isolation Panel [NHANES]    Frequency of Communication with Friends and Family: More than three times a week    Frequency of Social Gatherings with Friends and Family: Twice a week    Attends  Religious Services: More than 4 times per year    Active Member of [employer] or Organizations: No    Attends Archivist Meetings: Never    Marital Status: Widowed  Intimate Partner Violence: Not At Risk (11/29/2019)   Humiliation, Afraid, Rape, and Kick questionnaire    Fear of Current or Ex-Partner: No    Emotionally Abused: No    Physically Abused: No    Sexually Abused: No    Outpatient Medications Prior to Visit  Medication Sig Dispense Refill   acetaminophen (TYLENOL) 650 MG CR tablet Take 650 mg by mouth every 8 (eight) hours as needed for pain.     albuterol (VENTOLIN HFA) 108 (90 Base) MCG/ACT inhaler Inhale 2 puffs into the lungs every 4 (four) hours as needed for wheezing or shortness of breath. 8  g 2   Albuterol Sulfate (PROAIR RESPICLICK) 450 (90 Base) MCG/ACT AEPB Inhale 2 puffs into the lungs 4 (four) times daily as needed (wheeze, shortness of breath, bronchospams, cough). 1 each 4   allopurinol (ZYLOPRIM) 100 MG tablet Take 2 tablets (200 mg total) by mouth daily. 180 tablet 3   Blood Pressure Monitoring (BLOOD PRESSURE KIT) DEVI 1 Device by Does not apply route daily. Fluctuating blood pressure; please get appropriate size, large cuff 1 Device 0   cholecalciferol (VITAMIN D3) 25 MCG (1000 UNIT) tablet Take 1,000 Units by mouth daily.     ELIQUIS 5 MG TABS tablet Take 5 mg by mouth 2 (two) times daily.     famotidine (PEPCID) 20 MG tablet Take 20 mg by mouth daily as needed for heartburn or indigestion.     ferrous sulfate 325 (65 FE) MG EC tablet Take 1 tablet (325 mg total) by mouth daily. 90 tablet 1   hydrochlorothiazide (MICROZIDE) 12.5 MG capsule Take 1 capsule (12.5 mg total) by mouth daily. 90 capsule 0   lovastatin (MEVACOR) 40 MG tablet TAKE 1 TABLET BY MOUTH AT BEDTIME 90 tablet 3   mupirocin cream (BACTROBAN) 2 % Apply 1 application topically 2 (two) times daily. 15 g 1   simethicone (GAS-X) 80 MG chewable tablet Chew 1 tablet (80 mg total) by mouth 2 (two)  times a day. 60 tablet 2   SYMBICORT 160-4.5 MCG/ACT inhaler Inhale 2 puffs by mouth twice daily 33 g 0   triamcinolone cream (KENALOG) 0.1 % APPLY CREAM TO AFFECTED AREA TWICE DAILY IF NEEDED; NOT MORE THAN 1 WEEK AT A TIME, THEN TAKE A BREAK FOR 2 WEEKS 80 g 0   enalapril (VASOTEC) 10 MG tablet Take 1 tablet (10 mg total) by mouth daily. (Patient not taking: Reported on 09/01/2021) 90 tablet 1   No facility-administered medications prior to visit.    No Known Allergies  ROS Review of Systems  Constitutional:  Negative for chills and fever.  HENT:  Positive for congestion.   Eyes:  Negative for visual disturbance.  Respiratory:  Positive for cough and shortness of breath. Negative for wheezing.   Cardiovascular:  Negative for chest pain.  Gastrointestinal:  Negative for abdominal pain, nausea and vomiting.  Musculoskeletal:  Positive for arthralgias and gait problem.  Skin: Negative.   Neurological:  Positive for weakness.      Objective:    Physical Exam Constitutional:      Appearance: Normal appearance.  HENT:     Head: Normocephalic and atraumatic.     Nose: Congestion present.     Mouth/Throat:     Mouth: Mucous membranes are moist.     Comments: PND present. Eyes:     Conjunctiva/sclera: Conjunctivae normal.  Cardiovascular:     Rate and Rhythm: Normal rate and regular rhythm.  Pulmonary:     Effort: Pulmonary effort is normal.     Breath sounds: Normal breath sounds.  Skin:    General: Skin is warm and dry.  Neurological:     General: No focal deficit present.     Mental Status: Colleen [name] is alert. Mental status is at baseline.  Psychiatric:        Mood and Affect: Mood normal.        Behavior: Behavior normal.     BP (!) 146/74   Pulse 94   Temp 98 F (36.7 C) (Oral)   Resp 16   Ht 5' 2" (1.575 m)   Wt 249  lb (112.9 kg)   SpO2 99%   BMI 45.54 kg/m  Wt Readings from Last 3 Encounters:  09/01/21 249 lb (112.9 kg)  08/23/21 253 lb 4.9 oz (114.9 kg)   03/03/21 274 lb 12.8 oz (124.6 kg)     Health Maintenance Due  Topic Date Due   Zoster Vaccines- Shingrix (1 of 2) Never done   COVID-19 Vaccine (4 - Booster) 01/16/2020   INFLUENZA VACCINE  08/25/2021    There are no preventive care reminders to display for this patient.  Lab Results  Component Value Date   TSH 1.62 03/06/2019   Lab Results  Component Value Date   WBC 9.5 08/23/2021   HGB 11.4 (L) 08/23/2021   HCT 35.9 (L) 08/23/2021   MCV 93.5 08/23/2021   PLT 430 (H) 08/23/2021   Lab Results  Component Value Date   NA 136 08/23/2021   K 4.1 08/23/2021   CO2 26 08/23/2021   GLUCOSE 107 (H) 08/23/2021   BUN 29 (H) 08/23/2021   CREATININE 1.30 (H) 08/23/2021   BILITOT 0.6 08/23/2021   ALKPHOS 96 08/23/2021   AST 20 08/23/2021   ALT 13 08/23/2021   PROT 8.1 08/23/2021   ALBUMIN 3.7 08/23/2021   CALCIUM 9.5 08/23/2021   ANIONGAP 9 08/23/2021   EGFR 43 (L) 03/03/2021   Lab Results  Component Value Date   CHOL 172 03/03/2021   Lab Results  Component Value Date   HDL 58 03/03/2021   Lab Results  Component Value Date   LDLCALC 96 03/03/2021   Lab Results  Component Value Date   TRIG 87 03/03/2021   Lab Results  Component Value Date   CHOLHDL 3.0 03/03/2021   Lab Results  Component Value Date   HGBA1C 6.0 (H) 09/01/2020      Assessment & Plan:   1. Upper respiratory tract infection, unspecified type: Treat symptoms with steroid pack to help with congestion. Patient not interested in nasal sprays.   - methylPREDNISolone (MEDROL DOSEPAK) 4 MG TBPK tablet; Day 1: Take 8 mg (2 tablets) before breakfast, 4 mg (1 tablet) after lunch, 4 mg (1 tablet) after supper, and 8 mg (2 tablets) at bedtime. Day 2:Take 4 mg (1 tablet) before breakfast, 4 mg (1 tablet) after lunch, 4 mg (1 tablet) after supper, and 8 mg (2 tablets) at bedtime. Day 3: Take 4 mg (1 tablet) before breakfast, 4 mg (1 tablet) after lunch, 4 mg (1 tablet) after supper, and 4 mg (1 tablet) at  bedtime. Day 4: Take 4 mg (1 tablet) before breakfast, 4 mg (1 tablet) after lunch, and 4 mg (1 tablet) at bedtime. Day 5: Take 4 mg (1 tablet) before breakfast and 4 mg (1 tablet) at bedtime. Day 6: Take 4 mg (1 tablet) before breakfast.  Dispense: 1 each; Refill: 0  2. Essential hypertension: Blood pressure up slightly today but hadn't taken Colleen [name] medication yet today. Needs a new blood pressure cuff in a larger size, will send to the pharmacy. Patient blood pressure had been low at home previously and Colleen [name] had been feeling weak and dizzy, starting taking Enalapril 5 mg, which will be refilled today. Follow up in 1 month for recheck.  - Blood Pressure Monitoring (ADULT BLOOD PRESSURE CUFF LG) KIT; 1 each by Does not apply route daily.  Dispense: 1 kit; Refill: 0 - CBC w/Diff/Platelet - Basic Metabolic Panel (BMET) - enalapril (VASOTEC) 5 MG tablet; Take 1 tablet (5 mg total) by mouth daily.  Dispense: 90  tablet; Refill: 0  3. Hyperlipidemia, unspecified hyperlipidemia type: Recheck cholesterol now that that patient has lost about 25 pounds, fasting today.   - Lipid Profile  4. Chronic obstructive pulmonary disease, unspecified COPD type ([place]): Stable, continue Symbicort and Albuterol PRN.  5. Gastroesophageal reflux disease without esophagitis: On Pepcid 20 mg currently but needs to follow up again with GI for continued worked up. Referral placed to social work to help with transportation needs.  - AMB Referral to [facility]  6. Prediabetes: Recheck a1c today. - HgB A1c  7. Gout, unspecified cause, unspecified chronicity, unspecified site: Stable, no flares in over a year. Refill Allopurinol 200 mg daily.   - allopurinol (ZYLOPRIM) 100 MG tablet; Take 2 tablets (200 mg total) by mouth daily.  Dispense: 180 tablet; Refill: 3  8. Stage 3a chronic kidney disease ([place]): Recheck kidney function.  - Basic Metabolic Panel (BMET)  9. Iron deficiency anemia, unspecified iron  deficiency anemia type: Hgb lower in the ER at 11.4, recheck CBC and iron panel. Currently on iron supplements.   - Fe+TIBC+Fer  10. Primary osteoarthritis of both knees: Social work referral to help organize transportation/wheelchair for the patient so Colleen [name] can navigate Colleen [name] appointments.   - AMB Referral to [facility]   Follow-up: Return in about 4 weeks (around 09/29/2021).    Teodora Medici, DO

## 2021-09-01 NOTE — Patient Instructions (Addendum)
It was great seeing you today!  Please be sure to call [facility] for an appointment follow-up.  It is very important that you do this for your health.  There number is [phone].  Also the day of your appointment when you arrive call there office and have someone come and help you get in, they do have elevators.  We will call you once we review your blood work.

## 2021-09-02 LAB — CBC WITH DIFFERENTIAL/PLATELET
Absolute Monocytes: 570 cells/uL (ref 200–950)
Basophils Absolute: 46 cells/uL (ref 0–200)
Basophils Relative: 0.6 %
Eosinophils Absolute: 69 cells/uL (ref 15–500)
Eosinophils Relative: 0.9 %
HCT: 35.3 % (ref 35.0–45.0)
Hemoglobin: 11.7 g/dL (ref 11.7–15.5)
Lymphs Abs: 2757 cells/uL (ref 850–3900)
MCH: 29.6 pg (ref 27.0–33.0)
MCHC: 33.1 g/dL (ref 32.0–36.0)
MCV: 89.4 fL (ref 80.0–100.0)
MPV: 9.7 fL (ref 7.5–12.5)
Monocytes Relative: 7.4 %
Neutro Abs: 4258 cells/uL (ref 1500–7800)
Neutrophils Relative %: 55.3 %
Platelets: 438 10*3/uL — ABNORMAL HIGH (ref 140–400)
RBC: 3.95 10*6/uL (ref 3.80–5.10)
RDW: 13.1 % (ref 11.0–15.0)
Total Lymphocyte: 35.8 %
WBC: 7.7 10*3/uL (ref 3.8–10.8)

## 2021-09-02 LAB — BASIC METABOLIC PANEL
BUN/Creatinine Ratio: 18 (calc) (ref 6–22)
BUN: 22 mg/dL (ref 7–25)
CO2: 27 mmol/L (ref 20–32)
Calcium: 9.8 mg/dL (ref 8.6–10.4)
Chloride: 99 mmol/L (ref 98–110)
Creat: 1.23 mg/dL — ABNORMAL HIGH (ref 0.60–1.00)
Glucose, Bld: 112 mg/dL — ABNORMAL HIGH (ref 65–99)
Potassium: 4.2 mmol/L (ref 3.5–5.3)
Sodium: 136 mmol/L (ref 135–146)

## 2021-09-02 LAB — LIPID PANEL
Cholesterol: 167 mg/dL (ref <200)
HDL: 60 mg/dL (ref >=50)
LDL Cholesterol (Calc): 88 mg/dL (calc)
Non-HDL Cholesterol (Calc): 107 mg/dL (calc) (ref <130)
Total CHOL/HDL Ratio: 2.8 (calc) (ref <5.0)
Triglycerides: 96 mg/dL (ref <150)

## 2021-09-02 LAB — IRON,TIBC AND FERRITIN PANEL
%SAT: 16 % (calc) (ref 16–45)
Ferritin: 23 ng/mL (ref 16–288)
Iron: 50 ug/dL (ref 45–160)
TIBC: 311 mcg/dL (calc) (ref 250–450)

## 2021-09-02 LAB — HEMOGLOBIN A1C
Hgb A1c MFr Bld: 5.5 % of total Hgb (ref <5.7)
Mean Plasma Glucose: 111 mg/dL
eAG (mmol/L): 6.2 mmol/L

## 2021-09-03 ENCOUNTER — Telehealth: Payer: Self-pay

## 2021-09-03 ENCOUNTER — Other Ambulatory Visit: Payer: Self-pay | Admitting: Internal Medicine

## 2021-09-03 DIAGNOSIS — K219 Gastro-esophageal reflux disease without esophagitis: Secondary | ICD-10-CM

## 2021-09-03 NOTE — Telephone Encounter (Signed)
   Telephone encounter was:  Successful.  09/03/2021 Name: Colleen [name] MRN: [id number] DOB: [birth year]  Colleen [name] is a 76 y.o. year old female who is a primary care patient of Delsa Grana, [place] . The community resource team was consulted for assistance with Transportation Needs   Care guide performed the following interventions: Spoke with patient offered to arrange transportation for her, but she stated that she could drive but would need a wheelchair once she arrived at the door because the office is on the second floor. She prefers to drive herself to the appointmen.  I informed the patient I would call [facility] to see if they could provide assistance once she arrives. This is not a Veterinary surgeon provides.  Follow Up Plan:   I will call the patient once I receive a response from [facility].  Elainna Eshleman, AAS Paralegal, [facility]  [address], [place] [address] ??[email or web address]  ?? [phone]   [email or web address]

## 2021-09-03 NOTE — Telephone Encounter (Signed)
   Telephone encounter was:  Successful.  09/03/2021 Name: [name] MRN: [id number] DOB: 05-28-1945  Colleen [name] is a 76 y.o. year old female who is a primary care patient of Delsa Grana, [place] . The community resource team was consulted for assistance with Transportation Needs   Care guide performed the following interventions: Spoke with Autumn at [facility], they have wheelchairs and can accomodate the patient with assisting her once she arrives.  Autumn stated that the patient has not been seen since 2021 and they would need to receive a referral from [facility] in order to schedule an appointment.  Once the referral is received they will contact the patient and will make a note in her appointment that the patient needs a wheelchair and assistance.   Follow Up Plan:   I will call the patient to inform her wheelchair assistance is available at [facility].  Cordelia Bessinger, AAS Paralegal, [facility]  [address], [place] [address] ??[email or web address]  ?? [phone]   [email or web address]

## 2021-09-03 NOTE — Telephone Encounter (Signed)
   Telephone encounter was:  Successful.  09/03/2021 Name: [name] MRN: [id number] DOB: 20-Jun-1945  Colleen [name] is a 76 y.o. year old female who is a primary care patient of Delsa Grana, [place] . The community resource team was consulted for assistance with  wheelchair assistance.  Care guide performed the following interventions: Spoke with patient to inform her that [facility] has wheelchair assistance available.  They will need a referral sent since they have not seen her since 2021. I let her know I would send a message to Dr. Rosana Berger.  Sent In Basket message to Dr. Rosana Berger.   Follow Up Plan:  No further follow up planned at this time. The patient has been provided with needed resources.  Demetruis Depaul, AAS Paralegal, [facility]  [address], [place] [address] ??[email or web address]  ?? [phone]   [email or web address]

## 2021-09-03 NOTE — Telephone Encounter (Signed)
   Telephone encounter was:  Unsuccessful.  09/03/2021 Name: Colleen [name] MRN: [id number] DOB: 1945-04-05  Unsuccessful outbound call made today to assist with:  Left message on voicemail for [facility] to return my call regarding assisting patient once she arrives with a wheelchair to the second floor.   Outreach Attempt:  1st Attempt  A HIPAA compliant voice message was left requesting a return call.  Instructed patient to call back at [phone].  Britt Petroni, AAS Paralegal, [facility]  [address], [place] [address] ??[email or web address]  ?? [phone]   [email or web address]

## 2021-09-07 ENCOUNTER — Other Ambulatory Visit: Payer: Self-pay | Admitting: Internal Medicine

## 2021-09-07 ENCOUNTER — Other Ambulatory Visit: Payer: Self-pay | Admitting: Family Medicine

## 2021-09-07 DIAGNOSIS — Z1231 Encounter for screening mammogram for malignant neoplasm of breast: Secondary | ICD-10-CM

## 2021-09-25 ENCOUNTER — Ambulatory Visit: Payer: PPO | Admitting: Family Medicine

## 2021-09-29 ENCOUNTER — Encounter: Payer: Self-pay | Admitting: Family Medicine

## 2021-09-29 ENCOUNTER — Ambulatory Visit (INDEPENDENT_AMBULATORY_CARE_PROVIDER_SITE_OTHER): Payer: PPO | Admitting: Family Medicine

## 2021-09-29 VITALS — BP 138/74 | HR 97 | Temp 97.9°F | Resp 16 | Ht 62.0 in | Wt 248.7 lb

## 2021-09-29 DIAGNOSIS — I10 Essential (primary) hypertension: Secondary | ICD-10-CM | POA: Diagnosis not present

## 2021-09-29 DIAGNOSIS — K219 Gastro-esophageal reflux disease without esophagitis: Secondary | ICD-10-CM | POA: Diagnosis not present

## 2021-09-29 DIAGNOSIS — R7303 Prediabetes: Secondary | ICD-10-CM | POA: Diagnosis not present

## 2021-09-29 DIAGNOSIS — D509 Iron deficiency anemia, unspecified: Secondary | ICD-10-CM

## 2021-09-29 DIAGNOSIS — Z7901 Long term (current) use of anticoagulants: Secondary | ICD-10-CM

## 2021-09-29 DIAGNOSIS — Z23 Encounter for immunization: Secondary | ICD-10-CM | POA: Diagnosis not present

## 2021-09-29 DIAGNOSIS — K449 Diaphragmatic hernia without obstruction or gangrene: Secondary | ICD-10-CM

## 2021-09-29 DIAGNOSIS — I480 Paroxysmal atrial fibrillation: Secondary | ICD-10-CM | POA: Diagnosis not present

## 2021-09-29 DIAGNOSIS — K2289 Other specified disease of esophagus: Secondary | ICD-10-CM

## 2021-09-29 MED ORDER — ENALAPRIL MALEATE 5 MG PO TABS: 5.0000 mg | ORAL_TABLET | Freq: Every day | ORAL | 1 refills | Status: DC

## 2021-09-29 MED ORDER — ZOSTER VAC RECOMB ADJUVANTED 50 MCG/0.5ML IM SUSR: 0.5000 mL | Freq: Once | INTRAMUSCULAR | 1 refills | Status: AC

## 2021-09-29 NOTE — Assessment & Plan Note (Signed)
May need to be on PPI - she has GI f/up in a few months

## 2021-09-29 NOTE — Assessment & Plan Note (Signed)
Denies any bleeding, H/H improved with recent labs, iron panel also normal - she has hematology f/up at [facility] in the next couple weeks

## 2021-09-29 NOTE — Assessment & Plan Note (Signed)
Can continue diet/lifestyle efforts, avoiding food triggers and pepcid prn for now, she is due for GI f/up and I explained that tx based on their eval and findings (with prior tests) may need different tx in the future

## 2021-09-29 NOTE — Assessment & Plan Note (Signed)
Explained that some tissue changes in the GI system can become precancerous/cancerous and other tx or meds may be needed - per GI

## 2021-09-29 NOTE — Progress Notes (Signed)
Name: Colleen [name]   MRN: [id number]    DOB: Sep 06, 1945   Date:09/29/2021       Progress Note  Chief Complaint  Patient presents with   Follow-up   Hypertension    Pt states is doing well on medication change, she even noticed no more lightheadedness     Subjective:   Colleen [name] is a 76 y.o. female   presents to clinic for   Here for 4 week f/up on HTN - she had lightheaded episode/near syncope when taking 10 mg enalopril - was cutting it back to 5, no episodes since, also on HCTZ   BP at goal today, problems with cuff at home - last reading yesterday was around the same 130's BP Readings from Last 3 Encounters:  09/29/21 138/74  09/01/21 (!) 146/74  08/24/21 112/61   Pt denies CP, SOB, exertional sx, LE edema, palpitation, Ha's, visual disturbances, lightheadedness, hypotension, syncope.  GERD/ hx of iron deficiency anemia, pending GI f/up after multiple abnormal finding - she is taking pepcid some days, avoiding food triggers, some indigestion or stomach burning that gets better with food/drink, worse when empty    Current Outpatient Medications:    acetaminophen (TYLENOL) 650 MG CR tablet, Take 650 mg by mouth every 8 (eight) hours as needed for pain., Disp: , Rfl:    albuterol (VENTOLIN HFA) 108 (90 Base) MCG/ACT inhaler, Inhale 2 puffs into the lungs every 4 (four) hours as needed for wheezing or shortness of breath., Disp: 8 g, Rfl: 2   Albuterol Sulfate (PROAIR RESPICLICK) 825 (90 Base) MCG/ACT AEPB, Inhale 2 puffs into the lungs 4 (four) times daily as needed (wheeze, shortness of breath, bronchospams, cough)., Disp: 1 each, Rfl: 4   allopurinol (ZYLOPRIM) 100 MG tablet, Take 2 tablets (200 mg total) by mouth daily., Disp: 180 tablet, Rfl: 3   Blood Pressure Monitoring (ADULT BLOOD PRESSURE CUFF LG) KIT, 1 each by Does not apply route daily., Disp: 1 kit, Rfl: 0   cholecalciferol (VITAMIN D3) 25 MCG (1000 UNIT) tablet, Take 1,000 Units by mouth daily., Disp: ,  Rfl:    ELIQUIS 5 MG TABS tablet, Take 5 mg by mouth 2 (two) times daily., Disp: , Rfl:    enalapril (VASOTEC) 5 MG tablet, Take 1 tablet (5 mg total) by mouth daily., Disp: 90 tablet, Rfl: 0   famotidine (PEPCID) 20 MG tablet, Take 20 mg by mouth daily as needed for heartburn or indigestion., Disp: , Rfl:    ferrous sulfate 325 (65 FE) MG EC tablet, Take 1 tablet (325 mg total) by mouth daily., Disp: 90 tablet, Rfl: 1   hydrochlorothiazide (MICROZIDE) 12.5 MG capsule, Take 1 capsule (12.5 mg total) by mouth daily., Disp: 90 capsule, Rfl: 0   lovastatin (MEVACOR) 40 MG tablet, TAKE 1 TABLET BY MOUTH AT BEDTIME, Disp: 90 tablet, Rfl: 3   methylPREDNISolone (MEDROL DOSEPAK) 4 MG TBPK tablet, Day 1: Take 8 mg (2 tablets) before breakfast, 4 mg (1 tablet) after lunch, 4 mg (1 tablet) after supper, and 8 mg (2 tablets) at bedtime. Day 2:Take 4 mg (1 tablet) before breakfast, 4 mg (1 tablet) after lunch, 4 mg (1 tablet) after supper, and 8 mg (2 tablets) at bedtime. Day 3: Take 4 mg (1 tablet) before breakfast, 4 mg (1 tablet) after lunch, 4 mg (1 tablet) after supper, and 4 mg (1 tablet) at bedtime. Day 4: Take 4 mg (1 tablet) before breakfast, 4 mg (1 tablet) after lunch, and  4 mg (1 tablet) at bedtime. Day 5: Take 4 mg (1 tablet) before breakfast and 4 mg (1 tablet) at bedtime. Day 6: Take 4 mg (1 tablet) before breakfast., Disp: 1 each, Rfl: 0   mupirocin cream (BACTROBAN) 2 %, Apply 1 application topically 2 (two) times daily., Disp: 15 g, Rfl: 1   simethicone (GAS-X) 80 MG chewable tablet, Chew 1 tablet (80 mg total) by mouth 2 (two) times a day., Disp: 60 tablet, Rfl: 2   SYMBICORT 160-4.5 MCG/ACT inhaler, Inhale 2 puffs by mouth twice daily, Disp: 33 g, Rfl: 0   triamcinolone cream (KENALOG) 0.1 %, APPLY CREAM TO AFFECTED AREA TWICE DAILY IF NEEDED; NOT MORE THAN 1 WEEK AT A TIME, THEN TAKE A BREAK FOR 2 WEEKS, Disp: 80 g, Rfl: 0  Patient Active Problem List   Diagnosis Date Noted   Hidradenitis  suppurativa 11/12/2020   Status post gastroplasty-1980s 06/25/2020   CLE (columnar lined esophagus)    Hiatal hernia    Gastric nodule    Angiodysplasia of stomach and duodenum    Cecal polyp    Polyp of descending colon    Polyp of transverse colon    Atrial fibrillation ([place]) 06/14/2019   Current use of anticoagulant therapy 06/14/2019   Iron deficiency anemia 06/14/2019   Thrombocytosis 06/14/2019   GERD (gastroesophageal reflux disease) 04/15/2019   Chronic low back pain 04/15/2019   Right pulmonary embolus ([place]) 04/15/2019   Palpitations 03/13/2019   DOE (dyspnea on exertion) 03/13/2019   Anemia 03/07/2019   Atherosclerosis of both carotid arteries 09/20/2018   Spinal stenosis of lumbar region with neurogenic claudication 09/20/2018   Near syncope 09/10/2018   Aortic atherosclerosis ([place]) 09/05/2018   At high risk for falls 09/05/2018   Morbid obesity with BMI of 45.0-49.9, adult ([place]) 08/29/2017   Osteopenia 05/18/2017   Stage 3 chronic kidney disease ([place]) 04/21/2017   Morbid obesity ([place]) 04/21/2017   Trigger middle finger of left hand 02/28/2017   Chronic gouty arthropathy without tophi 11/29/2016   Hypergammaglobulinemia 11/29/2016   Osteoarthritis of both knees 11/29/2016   Bilateral primary osteoarthritis of knee 11/29/2016   Allergic rhinitis, seasonal 07/30/2014   Gout 07/30/2014   HLD (hyperlipidemia) 07/30/2014   Osteoarthritis of knee 07/30/2014   Prediabetes 07/30/2014   Asthma, mild persistent 07/30/2014   COPD (chronic obstructive pulmonary disease) ([place]) 07/09/2008   Essential hypertension 06/22/2006    Past Surgical History:  Procedure Laterality Date   BREAST BIOPSY Left 04/08/2014   intraductal papilloma 5 mm   COLONOSCOPY WITH PROPOFOL N/A 04/23/2020   Procedure: COLONOSCOPY WITH PROPOFOL;  Surgeon: Virgel Manifold, MD;  Location: [facility] ENDOSCOPY;  Service: Endoscopy;  Laterality: N/A;   ESOPHAGOGASTRODUODENOSCOPY (EGD) WITH PROPOFOL N/A  04/23/2020   Procedure: ESOPHAGOGASTRODUODENOSCOPY (EGD) WITH PROPOFOL;  Surgeon: Virgel Manifold, MD;  Location: [facility] ENDOSCOPY;  Service: Endoscopy;  Laterality: N/A;   LUMBAR LAMINECTOMY     TONSILLECTOMY      Family History  Problem Relation Age of Onset   Diabetes Mother    Lung cancer Father    Diabetes Sister    Congestive Heart Failure Sister    Glaucoma Sister    Breast cancer Neg Hx     Social History   Tobacco Use   Smoking status: Former    Packs/day: 1.50    Years: 20.00    Total pack years: 30.00    Types: Cigarettes    Quit date: 11/13/2008    Years since quitting: 12.8  Smokeless tobacco: Never   Tobacco comments:    smoking cessation materials not required  Vaping Use   Vaping Use: Never used  Substance Use Topics   Alcohol use: Not Currently   Drug use: No     No Known Allergies  Health Maintenance  Topic Date Due   COVID-19 Vaccine (4 - Mixed Product risk series) 01/16/2020   INFLUENZA VACCINE  08/25/2021   DEXA SCAN  03/03/2022 (Originally 05/19/2019)   MAMMOGRAM  10/07/2021   TETANUS/TDAP  10/12/2021   COLONOSCOPY (Pts 45-33yr Insurance coverage will need to be confirmed)  04/23/2025   Pneumonia Vaccine 75 Years old  Completed   Hepatitis C Screening  Completed   HPV VACCINES  Aged Out   Fecal DNA (Cologuard)  Discontinued   Zoster Vaccines- Shingrix  Discontinued    Chart Review Today: I personally reviewed active problem list, medication list, allergies, family history, social history, health maintenance, notes from last encounter, lab results, imaging with the patient/caregiver today.   Review of Systems  Constitutional: Negative.   HENT: Negative.    Eyes: Negative.   Respiratory: Negative.    Cardiovascular: Negative.   Gastrointestinal: Negative.   Endocrine: Negative.   Genitourinary: Negative.   Musculoskeletal: Negative.   Skin: Negative.   Allergic/Immunologic: Negative.   Neurological: Negative.    Hematological: Negative.   Psychiatric/Behavioral: Negative.    All other systems reviewed and are negative.    Objective:   Vitals:   09/29/21 1016  BP: 138/74  Pulse: 97  Resp: 16  Temp: 97.9 F (36.6 C)  TempSrc: Oral  SpO2: 99%  Weight: 248 lb 11.2 oz (112.8 kg)  Height: '5\' 2"'  (1.575 m)    Body mass index is 45.49 kg/m.  Physical Exam Constitutional:      General: She is not in acute distress.    Appearance: She is well-developed and well-groomed. She is obese. She is not ill-appearing, toxic-appearing or diaphoretic.  HENT:     Head: Normocephalic and atraumatic.     Right Ear: External ear normal.     Left Ear: External ear normal.     Nose: Nose normal.  Eyes:     General: No scleral icterus.       Right eye: No discharge.        Left eye: No discharge.     Conjunctiva/sclera: Conjunctivae normal.  Cardiovascular:     Rate and Rhythm: Normal rate and regular rhythm.     Pulses: Normal pulses.     Heart sounds: Normal heart sounds.  Pulmonary:     Effort: Pulmonary effort is normal.     Breath sounds: Normal breath sounds.  Abdominal:     General: Bowel sounds are normal.     Palpations: Abdomen is soft.  Skin:    General: Skin is warm.     Coloration: Skin is not jaundiced or pale.     Findings: No lesion.  Neurological:     Mental Status: She is alert. Mental status is at baseline.     Cranial Nerves: No dysarthria or facial asymmetry.     Coordination: Coordination is intact.     Comments: Walks with walker  Psychiatric:        Mood and Affect: Mood normal.        Behavior: Behavior normal. Behavior is cooperative.         Assessment & Plan:   Problem List Items Addressed This Visit       Cardiovascular and  Mediastinum   Essential hypertension - Primary    BP at goal - pt can continue the slower dose of enalapril with her other BP meds, continue home monitoring as needed for monitoring and with any sx Would prefer to allow slightly  elevated BP vs low BP causing near syncope/falls Labs recently done and reviewed today      Relevant Medications   enalapril (VASOTEC) 5 MG tablet   Atrial fibrillation (HCC)    RRR today on exam, anticoagulated, not currently on any BB or other rate or rhythm controlling meds      Relevant Medications   enalapril (VASOTEC) 5 MG tablet     Respiratory   Hiatal hernia    May need to be on PPI - she has GI f/up in a few months      Relevant Orders   H. pylori breath test     Digestive   GERD (gastroesophageal reflux disease)    Can continue diet/lifestyle efforts, avoiding food triggers and pepcid prn for now, she is due for GI f/up and I explained that tx based on their eval and findings (with prior tests) may need different tx in the future      Relevant Orders   H. pylori breath test   CLE (columnar lined esophagus)    Explained that some tissue changes in the GI system can become precancerous/cancerous and other tx or meds may be needed - per GI      Relevant Orders   H. pylori breath test     Other   Prediabetes (Chronic)   Current use of anticoagulant therapy    Denies any bleeding, H/H improved with recent labs, iron panel also normal - she has hematology f/up at [facility] in the next couple weeks      Iron deficiency anemia    Per hematology at [facility], currently on BID iron supplement - labs improved - will wait for their recommendations      Relevant Orders   H. pylori breath test   Other Visit Diagnoses     Need for shingles vaccine       Relevant Medications   Zoster Vaccine Adjuvanted [facility]) injection       6 month f/up per patient in Feb - I explained that I need to see her in office twice a year at least  Delsa Grana, PA-C 09/29/21 10:37 AM

## 2021-09-29 NOTE — Assessment & Plan Note (Signed)
Per hematology at [facility], currently on BID iron supplement - labs improved - will wait for their recommendations

## 2021-09-29 NOTE — Assessment & Plan Note (Signed)
BP at goal - pt can continue the slower dose of enalapril with her other BP meds, continue home monitoring as needed for monitoring and with any sx Would prefer to allow slightly elevated BP vs low BP causing near syncope/falls Labs recently done and reviewed today

## 2021-09-29 NOTE — Assessment & Plan Note (Signed)
RRR today on exam, anticoagulated, not currently on any BB or other rate or rhythm controlling meds

## 2021-09-30 LAB — H. PYLORI BREATH TEST: H. pylori Breath Test: NOT DETECTED

## 2021-10-08 ENCOUNTER — Ambulatory Visit
Admission: RE | Admit: 2021-10-08 | Discharge: 2021-10-08 | Disposition: A | Discharge status: Home / Self Care | Payer: PPO | Source: Ambulatory Visit | Attending: Internal Medicine | Admitting: Internal Medicine

## 2021-10-08 DIAGNOSIS — Z1231 Encounter for screening mammogram for malignant neoplasm of breast: Secondary | ICD-10-CM | POA: Insufficient documentation

## 2021-10-20 DIAGNOSIS — I2699 Other pulmonary embolism without acute cor pulmonale: Secondary | ICD-10-CM | POA: Diagnosis not present

## 2021-10-20 DIAGNOSIS — K5520 Angiodysplasia of colon without hemorrhage: Secondary | ICD-10-CM | POA: Diagnosis not present

## 2021-10-20 DIAGNOSIS — D500 Iron deficiency anemia secondary to blood loss (chronic): Secondary | ICD-10-CM | POA: Diagnosis not present

## 2021-10-23 DIAGNOSIS — R052 Subacute cough: Secondary | ICD-10-CM | POA: Diagnosis not present

## 2021-10-23 DIAGNOSIS — R0602 Shortness of breath: Secondary | ICD-10-CM | POA: Diagnosis not present

## 2021-10-23 DIAGNOSIS — Z87891 Personal history of nicotine dependence: Secondary | ICD-10-CM | POA: Diagnosis not present

## 2021-10-23 DIAGNOSIS — Z20822 Contact with and (suspected) exposure to covid-19: Secondary | ICD-10-CM | POA: Diagnosis not present

## 2021-10-23 DIAGNOSIS — R0600 Dyspnea, unspecified: Secondary | ICD-10-CM | POA: Diagnosis not present

## 2021-10-23 DIAGNOSIS — J441 Chronic obstructive pulmonary disease with (acute) exacerbation: Secondary | ICD-10-CM | POA: Diagnosis not present

## 2021-10-26 ENCOUNTER — Encounter: Payer: Self-pay | Admitting: Cardiology

## 2021-10-26 ENCOUNTER — Ambulatory Visit: Payer: PPO | Attending: Cardiology | Admitting: Cardiology

## 2021-10-26 VITALS — BP 110/72 | HR 88 | Ht 66.0 in | Wt 245.0 lb

## 2021-10-26 DIAGNOSIS — I480 Paroxysmal atrial fibrillation: Secondary | ICD-10-CM | POA: Diagnosis not present

## 2021-10-26 DIAGNOSIS — Z6839 Body mass index (BMI) 39.0-39.9, adult: Secondary | ICD-10-CM

## 2021-10-26 DIAGNOSIS — I10 Essential (primary) hypertension: Secondary | ICD-10-CM | POA: Diagnosis not present

## 2021-10-26 MED ORDER — ENALAPRIL MALEATE 5 MG PO TABS: 2.5000 mg | ORAL_TABLET | Freq: Every day | ORAL | 1 refills | Status: DC

## 2021-10-26 NOTE — Patient Instructions (Signed)
Medication Instructions:   Your physician has recommended you make the following change in your medication:    DECREASE your Enalapril (Vasotec) to 2.5 MG once a day.  *If you need a refill on your cardiac medications before your next appointment, please call your pharmacy*    Follow-Up: At [facility], you and your health needs are our priority.  As part of our continuing mission to provide you with exceptional heart care, we have created designated Provider Care Teams.  These Care Teams include your primary Cardiologist (physician) and Advanced Practice Providers (APPs -  Physician Assistants and Nurse Practitioners) who all work together to provide you with the care you need, when you need it.  We recommend signing up for the patient portal called "MyChart".  Sign up information is provided on this After Visit Summary.  MyChart is used to connect with patients for Virtual Visits (Telemedicine).  Patients are able to view lab/test results, encounter notes, upcoming appointments, etc.  Non-urgent messages can be sent to your provider as well.   To learn more about what you can do with MyChart, go to [email or web address].    Your next appointment:   6 week(s)  The format for your next appointment:   In Person  Provider:   You may see Kate Sable, MD or one of the following Advanced Practice Providers on your designated Care Team:   Murray Hodgkins, NP Christell Faith, PA-C Cadence Kathlen Mody, PA-C Gerrie Nordmann, NP    Other Instructions   Important Information About Sugar

## 2021-10-26 NOTE — Progress Notes (Signed)
Cardiology Office Note:    Date:  10/26/2021   ID:  Colleen [name], DOB 01/31/1945, MRN [id number]  PCP:  Delsa Grana, PA-C  Cardiologist:  Kate Sable, MD  Electrophysiologist:  None   Referring MD: Delsa Grana, PA-C   No chief complaint on file.   History of Present Illness:    Colleen [name] is a 76 y.o. female with a hx of hypertension, paroxysmal atrial fibrillation, hyperlipidemia, COPD, PE, former smoker who presents for follow-up.   Recently seen at [facility] ED for shortness of breath and bronchitis, given a short course of steroids and doxycycline which she is currently taking.  Symptoms overall have improved.  She denies palpitations.  During her ED visit, blood pressure was running low with systolics in the 40N.  She has not taken her Vasotec over the past 24 hours.  Has been eating healthier, losing weight.    Prior notes Patient was admitted at [facility] 2021 due to palpitations.  Chest CTA showed PE.  While in the hospital, telemetry monitor did note paroxysmal atrial fibrillation.  Eliquis was started for PE protocol.  Referral to hematology also placed.   Echocardiogram 03/2019 showed normal systolic and diastolic function,  Lexiscan Myoview 05/2019 with no evidence of ischemia.  Dyspnea on exertion was attributed to COPD.   Past Medical History:  Diagnosis Date   Asthma    Cataract    COPD (chronic obstructive pulmonary disease) (HCC)    GERD (gastroesophageal reflux disease)    Gout    History of pulmonary embolus (PE)    Hyperlipidemia    Hypertension    Osteoarthrosis    Prediabetes     Past Surgical History:  Procedure Laterality Date   BREAST BIOPSY Left 04/08/2014   intraductal papilloma 5 mm   COLONOSCOPY WITH PROPOFOL N/A 04/23/2020   Procedure: COLONOSCOPY WITH PROPOFOL;  Surgeon: Virgel Manifold, MD;  Location: [facility] ENDOSCOPY;  Service: Endoscopy;  Laterality: N/A;   ESOPHAGOGASTRODUODENOSCOPY (EGD) WITH PROPOFOL N/A 04/23/2020   Procedure:  ESOPHAGOGASTRODUODENOSCOPY (EGD) WITH PROPOFOL;  Surgeon: Virgel Manifold, MD;  Location: [facility] ENDOSCOPY;  Service: Endoscopy;  Laterality: N/A;   LUMBAR LAMINECTOMY     TONSILLECTOMY      Current Medications: Current Meds  Medication Sig   acetaminophen (TYLENOL) 650 MG CR tablet Take 650 mg by mouth every 8 (eight) hours as needed for pain.   albuterol (VENTOLIN HFA) 108 (90 Base) MCG/ACT inhaler Inhale 2 puffs into the lungs every 4 (four) hours as needed for wheezing or shortness of breath.   allopurinol (ZYLOPRIM) 100 MG tablet Take 2 tablets (200 mg total) by mouth daily.   Blood Pressure Monitoring (ADULT BLOOD PRESSURE CUFF LG) KIT 1 each by Does not apply route daily.   cholecalciferol (VITAMIN D3) 25 MCG (1000 UNIT) tablet Take 1,000 Units by mouth daily.   doxycycline (VIBRAMYCIN) 100 MG capsule Take 100 mg by mouth 2 (two) times daily.   ELIQUIS 5 MG TABS tablet Take 5 mg by mouth 2 (two) times daily.   famotidine (PEPCID) 20 MG tablet Take 20 mg by mouth daily as needed for heartburn or indigestion.   ferrous sulfate 325 (65 FE) MG EC tablet Take 1 tablet (325 mg total) by mouth daily.   hydrochlorothiazide (MICROZIDE) 12.5 MG capsule Take 1 capsule (12.5 mg total) by mouth daily.   lovastatin (MEVACOR) 40 MG tablet TAKE 1 TABLET BY MOUTH AT BEDTIME   predniSONE (DELTASONE) 20 MG tablet Take 40 mg by  mouth daily.   simethicone (GAS-X) 80 MG chewable tablet Chew 1 tablet (80 mg total) by mouth 2 (two) times a day.   SYMBICORT 160-4.5 MCG/ACT inhaler Inhale 2 puffs by mouth twice daily   triamcinolone cream (KENALOG) 0.1 % APPLY CREAM TO AFFECTED AREA TWICE DAILY IF NEEDED; NOT MORE THAN 1 WEEK AT A TIME, THEN TAKE A BREAK FOR 2 WEEKS   [DISCONTINUED] enalapril (VASOTEC) 5 MG tablet Take 1 tablet (5 mg total) by mouth daily.     Allergies:   Patient has no known allergies.   Social History   Socioeconomic History   Marital status: Widowed    Spouse name: Titus Dubin    Number of children: 0   Years of education: Not on file   Highest education level: 12th grade  Occupational History   Occupation: Retired  Tobacco Use   Smoking status: Former    Packs/day: 1.50    Years: 20.00    Total pack years: 30.00    Types: Cigarettes    Quit date: 11/13/2008    Years since quitting: 12.9   Smokeless tobacco: Never   Tobacco comments:    smoking cessation materials not required  Vaping Use   Vaping Use: Never used  Substance and Sexual Activity   Alcohol use: Not Currently   Drug use: No   Sexual activity: Not Currently  Other Topics Concern   Not on file  Social History Narrative   Pt lives alone   Social Determinants of Health   Financial Resource Strain: Low Risk  (07/18/2020)   Overall Financial Resource Strain (CARDIA)    Difficulty of Paying Living Expenses: Not hard at all  Food Insecurity: No [place] (11/29/2019)   Hunger Vital Sign    Worried About Running Out of Food in the Last Year: Never true    [place] in the Last Year: Never true  Transportation Needs: No Transportation Needs (09/03/2021)   PRAPARE - Hydrologist (Medical): No    Lack of Transportation (Non-Medical): No  Physical Activity: Inactive (11/29/2019)   Exercise Vital Sign    Days of Exercise per Week: 0 days    Minutes of Exercise per Session: 0 min  Stress: No Stress Concern Present (11/29/2019)   [place]    Feeling of Stress : Not at all  Social Connections: Moderately Isolated (11/29/2019)   Social Connection and Isolation Panel [NHANES]    Frequency of Communication with Friends and Family: More than three times a week    Frequency of Social Gatherings with Friends and Family: Twice a week    Attends Religious Services: More than 4 times per year    Active Member of [employer] or Organizations: No    Attends Archivist Meetings: Never    Marital  Status: Widowed     Family History: The patient's family history includes Breast cancer in her maternal aunt; Congestive Heart Failure in her sister; Diabetes in her mother and sister; Glaucoma in her sister; Lung cancer in her father.  ROS:   Please see the history of present illness.     All other systems reviewed and are negative.  EKGs/Labs/Other Studies Reviewed:    The following studies were reviewed today:   EKG:  EKG not ordered today.    Recent Labs: 08/23/2021: ALT 13 09/01/2021: BUN 22; Creat 1.23; Hemoglobin 11.7; Platelets 438; Potassium 4.2; Sodium 136  Recent Lipid  Panel    Component Value Date/Time   CHOL 167 09/01/2021 0848   CHOL 161 07/30/2014 0830   TRIG 96 09/01/2021 0848   HDL 60 09/01/2021 0848   HDL 82 07/30/2014 0830   CHOLHDL 2.8 09/01/2021 0848   VLDL 15 12/31/2015 0922   LDLCALC 88 09/01/2021 0848    Physical Exam:    VS:  BP 110/72   Pulse 88   Ht '5\' 6"'  (1.676 m)   Wt 245 lb (111.1 kg)   SpO2 99%   BMI 39.54 kg/m     Wt Readings from Last 3 Encounters:  10/26/21 245 lb (111.1 kg)  09/29/21 248 lb 11.2 oz (112.8 kg)  09/01/21 249 lb (112.9 kg)     GEN:  Well nourished, well developed in no acute distress, obese HEENT: Normal NECK: No JVD; No carotid bruits CARDIAC: Regular rate and rhythm, no murmurs RESPIRATORY:  Clear to auscultation without rales, wheezing or rhonchi  ABDOMEN: Soft, non-tender, non-distended MUSCULOSKELETAL:  trace edema; No deformity  SKIN: Warm and dry NEUROLOGIC:  Alert and oriented x 3 PSYCHIATRIC:  Normal affect   ASSESSMENT:    1. Primary hypertension   2. Paroxysmal atrial fibrillation (HCC)   3. BMI 39.0-39.9,adult    PLAN:    In order of problems listed above:  Hypertension, blood pressure controlled.  Reduce enalapril to 2.5 mg daily, continue HCTZ 12.5 mg daily. paroxysmal atrial fibrillation, asymptomatic,   Continue Eliquis.  Consider calcium channel blocker if she develops palpitations.   Previously intolerant to beta-blockers. Obesity, congratulated and encouraged on weight loss.  Advised to continue eating low calorie diet.  Follow-up in 6 weeks for BP check, if normal, continue 12 months follow-up visit   This note was generated in part or whole with voice recognition software. Voice recognition is usually quite accurate but there are transcription errors that can and very often do occur. I apologize for any typographical errors that were not detected and corrected.  Medication Adjustments/Labs and Tests Ordered: Current medicines are reviewed at length with the patient today.  Concerns regarding medicines are outlined above.  No orders of the defined types were placed in this encounter.   Meds ordered this encounter  Medications   enalapril (VASOTEC) 5 MG tablet    Sig: Take 0.5 tablets (2.5 mg total) by mouth daily.    Dispense:  90 tablet    Refill:  1    Does not need refills at this time. Patient will cut in half the tablet she has. This is a dose reduction.     Patient Instructions  Medication Instructions:   Your physician has recommended you make the following change in your medication:    DECREASE your Enalapril (Vasotec) to 2.5 MG once a day.  *If you need a refill on your cardiac medications before your next appointment, please call your pharmacy*    Follow-Up: At [facility], you and your health needs are our priority.  As part of our continuing mission to provide you with exceptional heart care, we have created designated Provider Care Teams.  These Care Teams include your primary Cardiologist (physician) and Advanced Practice Providers (APPs -  Physician Assistants and Nurse Practitioners) who all work together to provide you with the care you need, when you need it.  We recommend signing up for the patient portal called "MyChart".  Sign up information is provided on this After Visit Summary.  MyChart is used to connect with patients for  Virtual  Visits (Telemedicine).  Patients are able to view lab/test results, encounter notes, upcoming appointments, etc.  Non-urgent messages can be sent to your provider as well.   To learn more about what you can do with MyChart, go to [email or web address].    Your next appointment:   6 week(s)  The format for your next appointment:   In Person  Provider:   You may see Kate Sable, MD or one of the following Advanced Practice Providers on your designated Care Team:   Murray Hodgkins, NP Christell Faith, PA-C Cadence Kathlen Mody, PA-C Gerrie Nordmann, NP    Other Instructions   Important Information About Sugar         Signed, Kate Sable, MD  10/26/2021 10:54 AM    [place]

## 2021-11-03 DIAGNOSIS — D500 Iron deficiency anemia secondary to blood loss (chronic): Secondary | ICD-10-CM | POA: Diagnosis not present

## 2021-11-11 ENCOUNTER — Other Ambulatory Visit: Payer: Self-pay | Admitting: Family Medicine

## 2021-11-11 DIAGNOSIS — I10 Essential (primary) hypertension: Secondary | ICD-10-CM

## 2021-12-02 ENCOUNTER — Ambulatory Visit (INDEPENDENT_AMBULATORY_CARE_PROVIDER_SITE_OTHER): Payer: PPO | Admitting: Family Medicine

## 2021-12-02 ENCOUNTER — Encounter: Payer: Self-pay | Admitting: Family Medicine

## 2021-12-02 DIAGNOSIS — Z0000 Encounter for general adult medical examination without abnormal findings: Secondary | ICD-10-CM

## 2021-12-02 NOTE — Patient Instructions (Addendum)
Things to do to keep yourself healthy  - Exercise at least 30-45 minutes a day, 3-4 days a week.  - Eat a low-fat diet with lots of fruits and vegetables, up to 7-9 servings per day.  - Seatbelts can save your life. Wear them always.  - Smoke detectors on every level of your home, check batteries every year.  - Eye Doctor - have an eye exam every 1-2 years  - Safe sex - if you may be exposed to STDs, use a condom.  - Alcohol -  If you drink, do it moderately, less than 2 drinks per day.  - Colleen [name]. Choose someone to speak for you if you are not able. https://www.prepareforyourcare.org is a great website to help you navigate this. - Depression is common in our stressful world.If you're feeling down or losing interest in things you normally enjoy, please come in for a visit.  - Violence - If anyone is threatening or hurting you, please call immediately.  Make an appointment with an eye doctor and dentist whenever is convenient for you.  Check with your pharmacy about getting your tetanus and shingles vaccines. Bring a copy of your advanced directives to the office whenever is convenient for you.

## 2021-12-02 NOTE — Progress Notes (Signed)
Annual Wellness Visit  Patient: Colleen [name], Female    DOB: February 14, 1945, 76 y.o.   MRN: [id number]  Subjective  Chief Complaint  Patient presents with   Medicare Wellness    Colleen [name] is a 76 y.o. female who presents today for her Annual Wellness Visit. She reports consuming a  plant-based  diet. Exercise is limited by orthopedic condition(s): knee pain. She generally feels well. She reports sleeping well. She does not have additional problems to discuss today.   HPI  Vision:Not within last year  and Dental: Current dental problems and No regular dental care    Patient Active Problem List   Diagnosis Date Noted   Acquired trigger finger of right middle finger 07/24/2021   Acquired trigger finger of right ring finger 07/24/2021   Trigger finger of right hand 06/26/2021   Hidradenitis suppurativa 11/12/2020   Status post gastroplasty-1980s 06/25/2020   CLE (columnar lined esophagus)    Hiatal hernia    Gastric nodule    Angiodysplasia of stomach and duodenum    Cecal polyp    Polyp of descending colon    Polyp of transverse colon    Atrial fibrillation ([place]) 06/14/2019   Current use of anticoagulant therapy 06/14/2019   Iron deficiency anemia 06/14/2019   Thrombocytosis 06/14/2019   GERD (gastroesophageal reflux disease) 04/15/2019   Chronic low back pain 04/15/2019   Right pulmonary embolus ([place]) 04/15/2019   DOE (dyspnea on exertion) 03/13/2019   Atherosclerosis of both carotid arteries 09/20/2018   Spinal stenosis of lumbar region with neurogenic claudication 09/20/2018   Aortic atherosclerosis ([place]) 09/05/2018   At high risk for falls 09/05/2018   Morbid obesity with BMI of 45.0-49.9, adult ([place]) 08/29/2017   Osteopenia 05/18/2017   Stage 3 chronic kidney disease ([place]) 04/21/2017   Morbid obesity ([place]) 04/21/2017   Trigger middle finger of left hand 02/28/2017   Chronic gouty arthropathy without tophi 11/29/2016   Hypergammaglobulinemia 11/29/2016    Osteoarthritis of both knees 11/29/2016   Bilateral primary osteoarthritis of knee 11/29/2016   Allergic rhinitis, seasonal 07/30/2014   Gout 07/30/2014   HLD (hyperlipidemia) 07/30/2014   Osteoarthritis of knee 07/30/2014   Prediabetes 07/30/2014   Asthma, mild persistent 07/30/2014   COPD (chronic obstructive pulmonary disease) ([place]) 07/09/2008   Essential hypertension 06/22/2006   Social History   Tobacco Use   Smoking status: Former    Packs/day: 1.50    Years: 20.00    Total pack years: 30.00    Types: Cigarettes    Quit date: 11/13/2008    Years since quitting: 13.0   Smokeless tobacco: Never   Tobacco comments:    smoking cessation materials not required  Vaping Use   Vaping Use: Never used  Substance Use Topics   Alcohol use: Not Currently   Drug use: No      Medications: Outpatient Medications Prior to Visit  Medication Sig   acetaminophen (TYLENOL) 650 MG CR tablet Take 650 mg by mouth every 8 (eight) hours as needed for pain.   albuterol (VENTOLIN HFA) 108 (90 Base) MCG/ACT inhaler Inhale 2 puffs into the lungs every 4 (four) hours as needed for wheezing or shortness of breath.   allopurinol (ZYLOPRIM) 100 MG tablet Take 2 tablets (200 mg total) by mouth daily.   Blood Pressure Monitoring (ADULT BLOOD PRESSURE CUFF LG) KIT 1 each by Does not apply route daily.   cholecalciferol (VITAMIN D3) 25 MCG (1000 UNIT) tablet Take 1,000 Units by mouth daily.  ELIQUIS 5 MG TABS tablet Take 5 mg by mouth 2 (two) times daily.   enalapril (VASOTEC) 5 MG tablet Take 0.5 tablets (2.5 mg total) by mouth daily.   famotidine (PEPCID) 20 MG tablet Take 20 mg by mouth daily as needed for heartburn or indigestion.   ferrous sulfate 325 (65 FE) MG EC tablet Take 1 tablet (325 mg total) by mouth daily.   hydrochlorothiazide (MICROZIDE) 12.5 MG capsule Take 1 capsule by mouth once daily   lovastatin (MEVACOR) 40 MG tablet TAKE 1 TABLET BY MOUTH AT BEDTIME   mupirocin cream  (BACTROBAN) 2 % Apply 1 application topically 2 (two) times daily.   simethicone (GAS-X) 80 MG chewable tablet Chew 1 tablet (80 mg total) by mouth 2 (two) times a day.   SYMBICORT 160-4.5 MCG/ACT inhaler Inhale 2 puffs by mouth twice daily   triamcinolone cream (KENALOG) 0.1 % APPLY CREAM TO AFFECTED AREA TWICE DAILY IF NEEDED; NOT MORE THAN 1 WEEK AT A TIME, THEN TAKE A BREAK FOR 2 WEEKS   [DISCONTINUED] doxycycline (VIBRAMYCIN) 100 MG capsule Take 100 mg by mouth 2 (two) times daily.   [DISCONTINUED] predniSONE (DELTASONE) 20 MG tablet Take 40 mg by mouth daily.   Albuterol Sulfate (PROAIR RESPICLICK) 387 (90 Base) MCG/ACT AEPB Inhale 2 puffs into the lungs 4 (four) times daily as needed (wheeze, shortness of breath, bronchospams, cough). (Patient not taking: Reported on 12/02/2021)   No facility-administered medications prior to visit.    No Known Allergies  Patient Care Team: Delsa Grana, PA-C as PCP - General (Family Medicine) Kate Sable, MD as PCP - Cardiology (Cardiology) Inda Castle, MD as Consulting Physician (Rheumatology) Benedetto Goad, RN (Inactive) as Registered Nurse Yevonne Pax, MD as Referring Physician (Oncology) Jonathon Bellows, MD as Consulting Physician (Gastroenterology)  ROS    Objective  There were no vitals taken for this visit. BP Readings from Last 3 Encounters:  10/26/21 110/72  09/29/21 138/74  09/01/21 (!) 146/74   Wt Readings from Last 3 Encounters:  10/26/21 245 lb (111.1 kg)  09/29/21 248 lb 11.2 oz (112.8 kg)  09/01/21 249 lb (112.9 kg)     Physical Exam Entirety of visit conducted over the phone. Speaks in full sentences, no respiratory distress.  Most recent functional status assessment:    12/02/2021    9:50 AM  In your present state of health, do you have any difficulty performing the following activities:  Hearing? 0  Vision? 0  Difficulty concentrating or making decisions? 0  Walking or climbing stairs? 0  Dressing  or bathing? 0  Doing errands, shopping? 0   Most recent fall risk assessment:    12/02/2021    9:49 AM  Fall Risk   Falls in the past year? 1  Number falls in past yr: 0  Injury with Fall? 0  Risk for fall due to : Impaired balance/gait  Follow up Falls prevention discussed;Education provided;Falls evaluation completed    Most recent depression screenings:    12/02/2021    9:50 AM 09/29/2021   10:11 AM  PHQ 2/9 Scores  PHQ - 2 Score 0 0  PHQ- 9 Score 0 0   Most recent cognitive screening:    12/02/2021    9:50 AM  6CIT Screen  What Year? 0 points  What month? 0 points  What time? 0 points  Count back from 20 4 points  Months in reverse 0 points  Repeat phrase 0 points  Total Score 4 points  Most recent Audit-C alcohol use screening    12/02/2021    9:53 AM  Alcohol Use Disorder Test (AUDIT)  1. How often do you have a drink containing alcohol? 0  2. How many drinks containing alcohol do you have on a typical day when you are drinking? 0  3. How often do you have six or more drinks on one occasion? 0  AUDIT-C Score 0   A score of 3 or more in women, and 4 or more in men indicates increased risk for alcohol abuse, EXCEPT if all of the points are from question 1   Vision/Hearing Screen: No results found.  Last CBC Lab Results  Component Value Date   WBC 7.7 09/01/2021   HGB 11.7 09/01/2021   HCT 35.3 09/01/2021   MCV 89.4 09/01/2021   MCH 29.6 09/01/2021   RDW 13.1 09/01/2021   PLT 438 (H) 18/29/9371   Last metabolic panel Lab Results  Component Value Date   GLUCOSE 112 (H) 09/01/2021   NA 136 09/01/2021   K 4.2 09/01/2021   CL 99 09/01/2021   CO2 27 09/01/2021   BUN 22 09/01/2021   CREATININE 1.23 (H) 09/01/2021   GFRNONAA 43 (L) 08/23/2021   CALCIUM 9.8 09/01/2021   PROT 8.1 08/23/2021   ALBUMIN 3.7 08/23/2021   LABGLOB 4.8 (H) 10/29/2016   AGRATIO 0.7 10/29/2016   BILITOT 0.6 08/23/2021   ALKPHOS 96 08/23/2021   AST 20 08/23/2021   ALT 13  08/23/2021   ANIONGAP 9 08/23/2021   Last lipids Lab Results  Component Value Date   CHOL 167 09/01/2021   HDL 60 09/01/2021   LDLCALC 88 09/01/2021   TRIG 96 09/01/2021   CHOLHDL 2.8 09/01/2021   Last hemoglobin A1c Lab Results  Component Value Date   HGBA1C 5.5 09/01/2021      No results found for any visits on 12/02/21.    Assessment & Plan   Annual wellness visit done today including the all of the following: Reviewed patient's Family Medical History Reviewed and updated list of patient's medical providers Assessment of cognitive impairment was done Assessed patient's functional ability Established a written schedule for health screening [place] Completed and Reviewed  Exercise Activities and Dietary recommendations  Goals       DIET - INCREASE WATER INTAKE      Recommend to drink at least 6-8 8oz glasses of water per day.      Reduce sugar intake      Continue decreasing sugars in daily diet. Pt to avoid breads, sodas and tea.,       Track and Manage My Blood Pressure-Hypertension      Timeframe:  Long-Range Goal Priority:  High Start Date:  07/17/2020                            Expected End Date:  01/16/2021                     Follow Up Date 09/23/2020    - check blood pressure weekly    Why is this important?   You won't feel high blood pressure, but it can still hurt your blood vessels.  High blood pressure can cause heart or kidney problems. It can also cause a stroke.  Making lifestyle changes like losing a little weight or eating less salt will help.  Checking your blood pressure at home and at different times  of the day can help to control blood pressure.  If the doctor prescribes medicine remember to take it the way the doctor ordered.  Call the office if you cannot afford the medicine or if there are questions about it.     Notes:       Weight (lb) < 200 lb (90.7 kg) (pt-stated)      Lose weight so I can have knee  surgery        Immunization History  Administered Date(s) Administered   Fluad Quad(high Dose 65+) 10/26/2018, 10/16/2019   Influenza, High Dose Seasonal PF 10/04/2016, 10/12/2017   Influenza-Unspecified 10/26/2014, 09/27/2015, 10/26/2018, 10/18/2021   Moderna Covid-19 Vaccine Bivalent Booster 65yr & up 10/26/2021   Moderna Sars-Covid-2 Vaccination 11/21/2019   PFIZER(Purple Top)SARS-COV-2 Vaccination 02/01/2019, 02/22/2019   PNEUMOCOCCAL CONJUGATE-20 03/03/2021   Pneumococcal Conjugate-13 02/21/2013   Pneumococcal Polysaccharide-23 10/13/2011   Tdap 10/13/2011    Health Maintenance  Topic Date Due   TETANUS/TDAP  10/12/2021   DEXA SCAN  03/03/2022 (Originally 05/19/2019)   Lung Cancer Screening  12/03/2022 (Originally 04/08/1996)   COVID-19 Vaccine (5 - Mixed Product risk series) 12/21/2021   MAMMOGRAM  10/09/2022   Medicare Annual Wellness (AWV)  12/03/2022   COLONOSCOPY (Pts 45-487yrInsurance coverage will need to be confirmed)  04/23/2025   Pneumonia Vaccine 6552Years old  Completed   INFLUENZA VACCINE  Completed   Hepatitis C Screening  Completed   HPV VACCINES  Aged Out   Fecal DNA (Cologuard)  Discontinued   Zoster Vaccines- Shingrix  Discontinued   Advanced Care Planning: A voluntary discussion about advance care planning including the explanation and discussion of advance directives.  Discussed health care proxy and Living will, and the patient was able to identify a health care proxy as niece, Colleen [name] Patient does have a living will at present time. If patient does have living will, I have requested they bring this to the clinic to be scanned in to their chart.  Discussed health benefits of physical activity, and encouraged her to engage in regular exercise appropriate for her age and condition.    Problem List Items Addressed This Visit   None Visit Diagnoses     Encounter for Medicare annual wellness exam    -  Primary       Return in about 1 year  (around 12/03/2022) for awv.     AlMyles GipDO

## 2021-12-03 ENCOUNTER — Ambulatory Visit: Payer: PPO

## 2021-12-13 NOTE — Progress Notes (Unsigned)
Cardiology Clinic Note   Patient Name: Colleen [name] Date of Encounter: 12/14/2021  Primary Care Provider:  Delsa Grana, PA-C Primary Cardiologist:  Kate Sable, MD  Patient Profile    76 year old female history of hypertension, paroxysmal atrial fibrillation, hyperlipidemia, COPD, history of PE, former smoker, who presents today for follow-up.  Past Medical History    Past Medical History:  Diagnosis Date   Asthma    Cataract    COPD (chronic obstructive pulmonary disease) (HCC)    GERD (gastroesophageal reflux disease)    Gout    History of pulmonary embolus (PE)    Hyperlipidemia    Hypertension    Osteoarthrosis    Prediabetes    Past Surgical History:  Procedure Laterality Date   BREAST BIOPSY Left 04/08/2014   intraductal papilloma 5 mm   COLONOSCOPY WITH PROPOFOL N/A 04/23/2020   Procedure: COLONOSCOPY WITH PROPOFOL;  Surgeon: Virgel Manifold, MD;  Location: [facility] ENDOSCOPY;  Service: Endoscopy;  Laterality: N/A;   ESOPHAGOGASTRODUODENOSCOPY (EGD) WITH PROPOFOL N/A 04/23/2020   Procedure: ESOPHAGOGASTRODUODENOSCOPY (EGD) WITH PROPOFOL;  Surgeon: Virgel Manifold, MD;  Location: [facility] ENDOSCOPY;  Service: Endoscopy;  Laterality: N/A;   LUMBAR LAMINECTOMY     TONSILLECTOMY      Allergies  No Known Allergies  History of Present Illness    Colleen [name] is a 76 year old female with the previously mentioned past medical history of hypertension, paroxysmal atrial fibrillation, hyperlipidemia, COPD, history of PE, and former smoker.  She was admitted to [facility] in 2021 due to palpitations.  Chest CTA showed that she had a PE.  While in the hospital telemetry monitor did not paroxysmal atrial fibrillation.  She was started on apixaban PE protocol and referred to hematology.  She did undergo an echocardiogram in 03/2019 which showed normal systolic and diastolic function.  Lexiscan Myoview on 05/2019 showed no evidence of ischemia.  Dyspnea on  exertion was attributed to COPD.  She had previously been evaluated in [facility] emergency department for shortness of breath and bronchitis.  She was subsequently given steroids and doxycycline which she had taken.  Symptoms overall improved.  During her emergency department visit blood pressure was running low with systolics in the 71I.  She was last seen in clinic 10/26/2021 where her enalapril had been reduced to 2.5 mg daily and she was continued on HCTZ 12.5 mg daily.  She was also continued on apixaban.  It was noted that previously she was intolerant to beta-blockers and if she had complaints of palpitation with a to be started on calcium channel blockers.  She returns to clinic today stating that she has been doing fairly well.  She denies any chest pain, shortness breath, palpitations, peripheral edema.  She states her blood pressure has been better controlled since she has been on enalapril 2.5 mg daily and HCTZ 12.5 mg daily.  She is also denied any falls, lightheadedness, or dizziness.  She also denies emergency department visits or recent hospitalizations.  She has continued to change her dietary habits and continues to lose weight as she was told that she would need to lose weight prior to being able to have any type of orthopedic procedure done.  She has lost approximately 60 pounds since being told she had to lose weight for her knees but unfortunately has recently been advised that she is not a surgical candidate so she is continuing on her weight loss journey and hopes to get down to approximately 170  pounds.  Home Medications    Current Outpatient Medications  Medication Sig Dispense Refill   acetaminophen (TYLENOL) 650 MG CR tablet Take 650 mg by mouth every 8 (eight) hours as needed for pain.     albuterol (VENTOLIN HFA) 108 (90 Base) MCG/ACT inhaler Inhale 2 puffs into the lungs every 4 (four) hours as needed for wheezing or shortness of breath. 8 g 2   allopurinol (ZYLOPRIM) 100 MG  tablet Take 2 tablets (200 mg total) by mouth daily. 180 tablet 3   Blood Pressure Monitoring (ADULT BLOOD PRESSURE CUFF LG) KIT 1 each by Does not apply route daily. 1 kit 0   cholecalciferol (VITAMIN D3) 25 MCG (1000 UNIT) tablet Take 1,000 Units by mouth daily.     ELIQUIS 5 MG TABS tablet Take 5 mg by mouth 2 (two) times daily.     enalapril (VASOTEC) 2.5 MG tablet Take 1 tablet (2.5 mg total) by mouth daily. 90 tablet 3   enalapril (VASOTEC) 5 MG tablet Take 0.5 tablets (2.5 mg total) by mouth daily. 90 tablet 1   famotidine (PEPCID) 20 MG tablet Take 20 mg by mouth daily as needed for heartburn or indigestion.     ferrous sulfate 325 (65 FE) MG EC tablet Take 1 tablet (325 mg total) by mouth daily. 90 tablet 1   hydrochlorothiazide (MICROZIDE) 12.5 MG capsule Take 1 capsule by mouth once daily 90 capsule 1   lovastatin (MEVACOR) 40 MG tablet TAKE 1 TABLET BY MOUTH AT BEDTIME 90 tablet 3   simethicone (GAS-X) 80 MG chewable tablet Chew 1 tablet (80 mg total) by mouth 2 (two) times a day. 60 tablet 2   SYMBICORT 160-4.5 MCG/ACT inhaler Inhale 2 puffs by mouth twice daily 33 g 0   triamcinolone cream (KENALOG) 0.1 % APPLY CREAM TO AFFECTED AREA TWICE DAILY IF NEEDED; NOT MORE THAN 1 WEEK AT A TIME, THEN TAKE A BREAK FOR 2 WEEKS 80 g 0   Albuterol Sulfate (PROAIR RESPICLICK) 786 (90 Base) MCG/ACT AEPB Inhale 2 puffs into the lungs 4 (four) times daily as needed (wheeze, shortness of breath, bronchospams, cough). (Patient not taking: Reported on 12/02/2021) 1 each 4   mupirocin cream (BACTROBAN) 2 % Apply 1 application topically 2 (two) times daily. (Patient not taking: Reported on 12/14/2021) 15 g 1   No current facility-administered medications for this visit.     Family History    Family History  Problem Relation Age of Onset   Diabetes Mother    Lung cancer Father    Diabetes Sister    Congestive Heart Failure Sister    Glaucoma Sister    Breast cancer Maternal Aunt    She indicated  that her mother is deceased. She indicated that her father is deceased. She indicated that all of her three sisters are alive. She indicated that the status of her maternal aunt is unknown. She indicated that her half-brother is deceased.  Social History    Social History   Socioeconomic History   Marital status: Widowed    Spouse name: Titus Dubin   Number of children: 0   Years of education: Not on file   Highest education level: 12th grade  Occupational History   Occupation: Retired  Tobacco Use   Smoking status: Former    Packs/day: 1.50    Years: 20.00    Total pack years: 30.00    Types: Cigarettes    Quit date: 11/13/2008    Years since quitting: 42.0  Smokeless tobacco: Never   Tobacco comments:    smoking cessation materials not required  Vaping Use   Vaping Use: Never used  Substance and Sexual Activity   Alcohol use: Not Currently   Drug use: No   Sexual activity: Not Currently  Other Topics Concern   Not on file  Social History Narrative   Pt lives alone   Social Determinants of Health   Financial Resource Strain: Low Risk  (07/18/2020)   Overall Financial Resource Strain (CARDIA)    Difficulty of Paying Living Expenses: Not hard at all  Food Insecurity: No Food Insecurity (11/29/2019)   Hunger Vital Sign    Worried About Running Out of Food in the Last Year: Never true    Ran Out of Food in the Last Year: Never true  Transportation Needs: No Transportation Needs (09/03/2021)   PRAPARE - Hydrologist (Medical): No    Lack of Transportation (Non-Medical): No  Physical Activity: Inactive (11/29/2019)   Exercise Vital Sign    Days of Exercise per Week: 0 days    Minutes of Exercise per Session: 0 min  Stress: No Stress Concern Present (11/29/2019)   [place]    Feeling of Stress : Not at all  Social Connections: Moderately Isolated (11/29/2019)   Social Connection  and Isolation Panel [NHANES]    Frequency of Communication with Friends and Family: More than three times a week    Frequency of Social Gatherings with Friends and Family: Twice a week    Attends Religious Services: More than 4 times per year    Active Member of [employer] or Organizations: No    Attends Archivist Meetings: Never    Marital Status: Widowed  Intimate Partner Violence: Not At Risk (11/29/2019)   Humiliation, Afraid, Rape, and Kick questionnaire    Fear of Current or Ex-Partner: No    Emotionally Abused: No    Physically Abused: No    Sexually Abused: No     Review of Systems    General:  No chills, fever, night sweats or weight changes.  Exertional fatigue Cardiovascular:  No chest pain, dyspnea on exertion, edema, orthopnea, palpitations, paroxysmal nocturnal dyspnea. Dermatological: No rash, lesions/masses Respiratory: No cough, dyspnea Urologic: No hematuria, dysuria Musculoskeletal: Endorses bilateral knee pain Neurologic:  No visual changes, wkns, changes in mental status. All other systems reviewed and are otherwise negative except as noted above.   Physical Exam    VS:  BP 122/70 (BP Location: Left Arm, Patient Position: Sitting, Cuff Size: Normal)   Pulse 79   Ht _0  (1.676 m)   Wt 240 lb (108.9 kg)   SpO2 98%   BMI 38.74 kg/m  , BMI Body mass index is 38.74 kg/m.     GEN: Well nourished, well developed, in no acute distress. HEENT: normal.  Glasses on Neck: Supple, no JVD, carotid bruits, or masses. Cardiac: RRR, no murmurs, rubs, or gallops. No clubbing, cyanosis, edema.  Radials/DP/PT 2+ and equal bilaterally.  Respiratory:  Respirations regular and unlabored, clear to auscultation bilaterally. GI: Soft, nontender, nondistended, BS + x 4. MS: no deformity or atrophy. Skin: warm and dry, no rash. Neuro:  Strength and sensation are intact. Psych: Normal affect.  Accessory Clinical Findings    ECG personally reviewed by me today-sinus  rhythm with a rate of 79 and a left axis deviation- No acute changes  Lab Results  Component Value Date  WBC 7.7 09/01/2021   HGB 11.7 09/01/2021   HCT 35.3 09/01/2021   MCV 89.4 09/01/2021   PLT 438 (H) 09/01/2021   Lab Results  Component Value Date   CREATININE 1.23 (H) 09/01/2021   BUN 22 09/01/2021   NA 136 09/01/2021   K 4.2 09/01/2021   CL 99 09/01/2021   CO2 27 09/01/2021   Lab Results  Component Value Date   ALT 13 08/23/2021   AST 20 08/23/2021   ALKPHOS 96 08/23/2021   BILITOT 0.6 08/23/2021   Lab Results  Component Value Date   CHOL 167 09/01/2021   HDL 60 09/01/2021   LDLCALC 88 09/01/2021   TRIG 96 09/01/2021   CHOLHDL 2.8 09/01/2021    Lab Results  Component Value Date   HGBA1C 5.5 09/01/2021    Assessment & Plan   1.  Hypertension with blood pressure today of 122/70.  Stable.  She has been continued on enalapril 2.5 mg daily and HCTZ 12.5 mg daily.  She has been encouraged to continue to monitor blood pressure at home as well.  2.  Paroxysmal atrial fibrillation currently in sinus rhythm.  She is asymptomatic and denies palpitations.  She is continued on apixaban 5 mg twice daily for CHA2DS2-VASc score of at least 5.  3.  Obesity is continued to be congratulated and encouraged on a weight loss journey.  She is encouraged to continue with her dietary changes that she has not been sweets and sodas at this time.  She is roughly lost 60 pounds since dietary changes have been made.  4.  Disposition patient return to clinic to see MD/APP in 11 to 12 months with an EKG on return or sooner if needed.  Reginal Wojcicki, NP 12/14/2021, 9:27 AM

## 2021-12-14 ENCOUNTER — Encounter: Payer: Self-pay | Admitting: Cardiology

## 2021-12-14 ENCOUNTER — Ambulatory Visit: Payer: PPO | Attending: Cardiology | Admitting: Cardiology

## 2021-12-14 VITALS — BP 122/70 | HR 79 | Ht 66.0 in | Wt 240.0 lb

## 2021-12-14 DIAGNOSIS — I480 Paroxysmal atrial fibrillation: Secondary | ICD-10-CM | POA: Diagnosis not present

## 2021-12-14 DIAGNOSIS — I10 Essential (primary) hypertension: Secondary | ICD-10-CM

## 2021-12-14 MED ORDER — ENALAPRIL MALEATE 2.5 MG PO TABS: 2.5000 mg | ORAL_TABLET | Freq: Every day | ORAL | 3 refills | Status: DC

## 2021-12-14 NOTE — Patient Instructions (Signed)
Medication Instructions:  No changes at this time.   *If you need a refill on your cardiac medications before your next appointment, please call your pharmacy*   Lab Work: None  If you have labs (blood work) drawn today and your tests are completely normal, you will receive your results only by: [place] (if you have MyChart) OR A paper copy in the mail If you have any lab test that is abnormal or we need to change your treatment, we will call you to review the results.   Testing/Procedures: None   Follow-Up: At [facility], you and your health needs are our priority.  As part of our continuing mission to provide you with exceptional heart care, we have created designated Provider Care Teams.  These Care Teams include your primary Cardiologist (physician) and Advanced Practice Providers (APPs -  Physician Assistants and Nurse Practitioners) who all work together to provide you with the care you need, when you need it.  We recommend signing up for the patient portal called "MyChart".  Sign up information is provided on this After Visit Summary.  MyChart is used to connect with patients for Virtual Visits (Telemedicine).  Patients are able to view lab/test results, encounter notes, upcoming appointments, etc.  Non-urgent messages can be sent to your provider as well.   To learn more about what you can do with MyChart, go to [email or web address].    Your next appointment:   1 year(s)  The format for your next appointment:   In Person  Provider:   Kate Sable, MD or Gerrie Nordmann, NP        Important Information About Sugar

## 2022-01-05 ENCOUNTER — Other Ambulatory Visit: Payer: Self-pay | Admitting: Family Medicine

## 2022-01-05 DIAGNOSIS — J441 Chronic obstructive pulmonary disease with (acute) exacerbation: Secondary | ICD-10-CM

## 2022-01-05 NOTE — Telephone Encounter (Signed)
Requested Prescriptions  Pending Prescriptions Disp Refills   SYMBICORT 160-4.5 MCG/ACT inhaler [Pharmacy Med Name: Symbicort 160-4.5 MCG/ACT Inhalation Aerosol] 33 g 0    Sig: Inhale 2 puffs by mouth twice daily     Pulmonology:  Combination Products Passed - 01/05/2022  2:18 PM      Passed - Valid encounter within last 12 months    Recent Outpatient Visits           1 month ago Encounter for [employer] annual wellness exam   [facility] Myles Gip, DO   3 months ago Essential hypertension   [facility] Delsa Grana, PA-C   4 months ago Essential hypertension   [place], DO   10 months ago Right calf pain   [facility] Teodora Medici, DO   1 year ago Hidradenitis suppurativa   [facility] Myles Gip, DO       Future Appointments             In 1 week Jonathon Bellows, MD [place]   In 2 months Delsa Grana, PA-C [facility]

## 2022-01-14 ENCOUNTER — Other Ambulatory Visit: Payer: Self-pay

## 2022-01-14 ENCOUNTER — Ambulatory Visit (INDEPENDENT_AMBULATORY_CARE_PROVIDER_SITE_OTHER): Payer: PPO | Admitting: Gastroenterology

## 2022-01-14 ENCOUNTER — Encounter: Payer: Self-pay | Admitting: Gastroenterology

## 2022-01-14 VITALS — BP 99/67 | HR 86 | Temp 98.7°F | Wt 238.0 lb

## 2022-01-14 DIAGNOSIS — D509 Iron deficiency anemia, unspecified: Secondary | ICD-10-CM

## 2022-01-14 NOTE — Progress Notes (Signed)
  Kiran Anna MD, MRCP(U.K) [address]  [address]  [place], [place] [address]  Main: [phone]  Fax: [phone]   Primary Care Physician: Tapia, Leisa, PA-C  Primary Gastroenterologist:  Dr. Kiran Anna   Chief Complaint  Patient presents with   Gastroesophageal Reflux    HPI: Colleen [name] is a 76 y.o. female   Summary of history :  Previously and last seen by Dr Tahiliani for iron deficiency anemia in 03/2020 was on Eloquis.    04/23/2020: 3 polyps sessile resected. EGD showed avm in duodenum and stomach- treated with APC, medium sized hiatal hernia.  H pylori gastritis+, reflux esophagitis on biopsies.  Treated with triple therapy abx.   Referred to surgery for paraesophageal hernia.   09/29/2021: H pylori breath test : negative. 10/20/2021: Hb 10.6 , MCV 88.Ferritin 12.9 .    Interval history      She was referred to see me for GERD but the patient says she has no GI symptoms she says she was here to come evaluate for the anemia which she has had in the past.  She says she is on oral iron pills.   Current Outpatient Medications  Medication Sig Dispense Refill   acetaminophen (TYLENOL) 650 MG CR tablet Take 650 mg by mouth every 8 (eight) hours as needed for pain.     albuterol (VENTOLIN HFA) 108 (90 Base) MCG/ACT inhaler Inhale 2 puffs into the lungs every 4 (four) hours as needed for wheezing or shortness of breath. 8 g 2   Albuterol Sulfate (PROAIR RESPICLICK) 108 (90 Base) MCG/ACT AEPB Inhale 2 puffs into the lungs 4 (four) times daily as needed (wheeze, shortness of breath, bronchospams, cough). 1 each 4   allopurinol (ZYLOPRIM) 100 MG tablet Take 2 tablets (200 mg total) by mouth daily. 180 tablet 3   Blood Pressure Monitoring (ADULT BLOOD PRESSURE CUFF LG) KIT 1 each by Does not apply route daily. 1 kit 0   cholecalciferol (VITAMIN D3) 25 MCG (1000 UNIT) tablet Take 1,000 Units by mouth daily.     ELIQUIS 5 MG TABS tablet Take 5 mg by mouth 2 (two)  times daily.     enalapril (VASOTEC) 2.5 MG tablet Take 1 tablet (2.5 mg total) by mouth daily. 90 tablet 3   enalapril (VASOTEC) 5 MG tablet Take 0.5 tablets (2.5 mg total) by mouth daily. 90 tablet 1   famotidine (PEPCID) 20 MG tablet Take 20 mg by mouth daily as needed for heartburn or indigestion.     ferrous sulfate 325 (65 FE) MG EC tablet Take 1 tablet (325 mg total) by mouth daily. 90 tablet 1   hydrochlorothiazide (MICROZIDE) 12.5 MG capsule Take 1 capsule by mouth once daily 90 capsule 1   lovastatin (MEVACOR) 40 MG tablet TAKE 1 TABLET BY MOUTH AT BEDTIME 90 tablet 3   mupirocin cream (BACTROBAN) 2 % Apply 1 application topically 2 (two) times daily. 15 g 1   simethicone (GAS-X) 80 MG chewable tablet Chew 1 tablet (80 mg total) by mouth 2 (two) times a day. 60 tablet 2   SYMBICORT 160-4.5 MCG/ACT inhaler Inhale 2 puffs by mouth twice daily 33 g 0   triamcinolone cream (KENALOG) 0.1 % APPLY CREAM TO AFFECTED AREA TWICE DAILY IF NEEDED; NOT MORE THAN 1 WEEK AT A TIME, THEN TAKE A BREAK FOR 2 WEEKS 80 g 0   No current facility-administered medications for this visit.    Allergies as of 01/14/2022   (No   Known Allergies)    ROS:  General: Negative for anorexia, weight loss, fever, chills, fatigue, weakness. ENT: Negative for hoarseness, difficulty swallowing , nasal congestion. CV: Negative for chest pain, angina, palpitations, dyspnea on exertion, peripheral edema.  Respiratory: Negative for dyspnea at rest, dyspnea on exertion, cough, sputum, wheezing.  GI: See history of present illness. GU:  Negative for dysuria, hematuria, urinary incontinence, urinary frequency, nocturnal urination.  Endo: Negative for unusual weight change.    Physical Examination:   BP 99/67   Pulse 86   Temp 98.7 F (37.1 C) (Oral)   Wt 238 lb (108 kg)   BMI 38.41 kg/m   General: Well-nourished, well-developed in no acute distress.  Eyes: No icterus. Conjunctivae pink. Neuro: Alert and oriented  x 3.  Grossly intact. Skin: Warm and dry, no jaundice.   Psych: Alert and cooperative, normal mood and affect.   Imaging Studies: No results found.  Assessment and Plan:   Colleen [name] is a 76 y.o. y/o female has a history of iron deficiency anemia evaluated by Dr. Bonna Gains in the past.  Had an EGD and colonoscopy back in March 2022 was found to have AVMs in the stomach and small bowel treated with APC history of H. pylori.  Labs in 10/14/2021 show a low iron of 12.9 and a hemoglobin of 10.6.  She was referred to see me for reflux but she denies any reflux says she is here to complete evaluation for iron deficiency anemia.  Suggested we complete GI evaluation with capsule study of the small bowel.  Celiac  serology should also be performed today.  Risks, benefits, alternatives of Givens capsule discussed with patient to include but not limited to the rare risk of Given's capsule becoming lodged in the GI tract requiring surgical removal.  The patient agrees with this plan & consent will be obtained.   Dr Jonathon Bellows  MD,MRCP [facility]) Follow up in 3 months

## 2022-01-15 LAB — CELIAC DISEASE AB SCREEN W/RFX
Antigliadin Abs, IgA: 6 units (ref 0–19)
IgA/Immunoglobulin A, Serum: 327 mg/dL (ref 64–422)
Transglutaminase IgA: <2 U/mL (ref 0–3)

## 2022-01-21 ENCOUNTER — Other Ambulatory Visit: Payer: Self-pay | Admitting: Family Medicine

## 2022-01-21 DIAGNOSIS — J441 Chronic obstructive pulmonary disease with (acute) exacerbation: Secondary | ICD-10-CM

## 2022-01-21 NOTE — Telephone Encounter (Signed)
Unable to refill per protocol, request is too soon. Last refill 01/05/22 for 33g. Will refuse.  Requested Prescriptions  Pending Prescriptions Disp Refills   SYMBICORT 160-4.5 MCG/ACT inhaler [Pharmacy Med Name: Symbicort 160-4.5 MCG/ACT Inhalation Aerosol] 33 g 0    Sig: Inhale 2 puffs by mouth twice daily     Pulmonology:  Combination Products Passed - 01/21/2022 11:35 AM      Passed - Valid encounter within last 12 months    Recent Outpatient Visits           1 month ago Encounter for [employer] annual wellness exam   [facility] Myles Gip, DO   3 months ago Essential hypertension   [facility] Delsa Grana, PA-C   4 months ago Essential hypertension   [place], DO   10 months ago Right calf pain   [facility] Teodora Medici, DO   1 year ago Hidradenitis suppurativa   [facility] Myles Gip, DO       Future Appointments             In 2 months Delsa Grana, PA-C [facility], [place]   In 2 months Jonathon Bellows, MD [place]

## 2022-01-27 ENCOUNTER — Encounter: Payer: Self-pay | Admitting: Gastroenterology

## 2022-01-28 ENCOUNTER — Ambulatory Visit
Admission: RE | Admit: 2022-01-28 | Discharge: 2022-01-28 | Disposition: A | Discharge status: Home / Self Care | Payer: PPO | Attending: Gastroenterology | Admitting: Gastroenterology

## 2022-01-28 ENCOUNTER — Encounter
Admission: RE | Disposition: A | Discharge status: Home / Self Care | Payer: Self-pay | Source: Home / Self Care | Attending: Gastroenterology

## 2022-01-28 DIAGNOSIS — Z029 Encounter for administrative examinations, unspecified: Secondary | ICD-10-CM | POA: Insufficient documentation

## 2022-01-28 DIAGNOSIS — D509 Iron deficiency anemia, unspecified: Secondary | ICD-10-CM

## 2022-01-28 HISTORY — PX: GIVENS CAPSULE STUDY: SHX5432

## 2022-01-28 SURGERY — IMAGING PROCEDURE, GI TRACT, INTRALUMINAL, VIA CAPSULE

## 2022-01-28 MED ORDER — SODIUM CHLORIDE 0.9 % IV SOLN: INTRAVENOUS | Status: DC

## 2022-01-29 ENCOUNTER — Encounter: Payer: Self-pay | Admitting: Gastroenterology

## 2022-02-10 ENCOUNTER — Other Ambulatory Visit: Payer: Self-pay | Admitting: Family Medicine

## 2022-02-10 DIAGNOSIS — L309 Dermatitis, unspecified: Secondary | ICD-10-CM

## 2022-03-30 ENCOUNTER — Ambulatory Visit (INDEPENDENT_AMBULATORY_CARE_PROVIDER_SITE_OTHER): Payer: PPO | Admitting: Family Medicine

## 2022-03-30 ENCOUNTER — Encounter: Payer: Self-pay | Admitting: Family Medicine

## 2022-03-30 VITALS — BP 106/64 | HR 89 | Temp 98.1°F | Resp 16 | Ht 66.0 in | Wt 233.7 lb

## 2022-03-30 DIAGNOSIS — D509 Iron deficiency anemia, unspecified: Secondary | ICD-10-CM | POA: Diagnosis not present

## 2022-03-30 DIAGNOSIS — I480 Paroxysmal atrial fibrillation: Secondary | ICD-10-CM

## 2022-03-30 DIAGNOSIS — Z5181 Encounter for therapeutic drug level monitoring: Secondary | ICD-10-CM | POA: Diagnosis not present

## 2022-03-30 DIAGNOSIS — R7303 Prediabetes: Secondary | ICD-10-CM

## 2022-03-30 DIAGNOSIS — I2699 Other pulmonary embolism without acute cor pulmonale: Secondary | ICD-10-CM | POA: Diagnosis not present

## 2022-03-30 DIAGNOSIS — J449 Chronic obstructive pulmonary disease, unspecified: Secondary | ICD-10-CM | POA: Diagnosis not present

## 2022-03-30 DIAGNOSIS — N1831 Chronic kidney disease, stage 3a: Secondary | ICD-10-CM | POA: Diagnosis not present

## 2022-03-30 DIAGNOSIS — I10 Essential (primary) hypertension: Secondary | ICD-10-CM

## 2022-03-30 DIAGNOSIS — R8290 Unspecified abnormal findings in urine: Secondary | ICD-10-CM

## 2022-03-30 DIAGNOSIS — Z7901 Long term (current) use of anticoagulants: Secondary | ICD-10-CM | POA: Diagnosis not present

## 2022-03-30 DIAGNOSIS — E785 Hyperlipidemia, unspecified: Secondary | ICD-10-CM

## 2022-03-30 DIAGNOSIS — K219 Gastro-esophageal reflux disease without esophagitis: Secondary | ICD-10-CM

## 2022-03-30 DIAGNOSIS — D473 Essential (hemorrhagic) thrombocythemia: Secondary | ICD-10-CM

## 2022-03-30 DIAGNOSIS — Z780 Asymptomatic menopausal state: Secondary | ICD-10-CM

## 2022-03-30 LAB — POCT URINALYSIS DIPSTICK
Bilirubin, UA: NEGATIVE
Glucose, UA: NEGATIVE
Ketones, UA: NEGATIVE
Leukocytes, UA: NEGATIVE
Nitrite, UA: NEGATIVE
Protein, UA: NEGATIVE
Spec Grav, UA: 1.01 (ref 1.010–1.025)
Urobilinogen, UA: 0.2 E.U./dL
pH, UA: 5 (ref 5.0–8.0)

## 2022-03-30 NOTE — Progress Notes (Unsigned)
Name: QUAN SIERRA   MRN: [id number]    DOB: 25-Mar-1945   Date:03/30/2022       Progress Note  Chief Complaint  Patient presents with   Follow-up   Gastroesophageal Reflux   Hypertension   Hyperlipidemia     Subjective:   KERITH VENARD is a 77 y.o. female, presents to clinic for   Afib - intolerant to BB following with cardiology BP was been well controlled enalopril and hctz 2.5 and 12.5 She denies palpitations, CP, SOB, orthopnea BP Readings from Last 3 Encounters:  03/30/22 106/64  01/14/22 99/67  12/14/21 122/70   Pulse Readings from Last 3 Encounters:  03/30/22 89  01/14/22 86  12/14/21 79    Hyperlipidemia: Currently treated with lovastatin, pt reports good med compliance Last Lipids: Lab Results  Component Value Date   CHOL 167 09/01/2021   HDL 60 09/01/2021   LDLCALC 88 09/01/2021   TRIG 96 09/01/2021   CHOLHDL 2.8 09/01/2021   - Denies: Chest pain, shortness of breath, myalgias, claudication  Anemia - she follows with [facility] and GI just did capsule study On eliquis for afib and hx of PE  Prediabetes - due for recheck of A1C  Gout -  Lab Results  Component Value Date   LABURIC 5.2 09/01/2020  On allopurinol, last uric acid at goal, no recent flares  She reports urine or GU odor despite good hygiene No vaginal or genital irritation, itching, no discharge, no dysuria or hematuria      Current Outpatient Medications:    acetaminophen (TYLENOL) 650 MG CR tablet, Take 650 mg by mouth every 8 (eight) hours as needed for pain., Disp: , Rfl:    albuterol (VENTOLIN HFA) 108 (90 Base) MCG/ACT inhaler, Inhale 2 puffs into the lungs every 4 (four) hours as needed for wheezing or shortness of breath., Disp: 8 g, Rfl: 2   Albuterol Sulfate (PROAIR RESPICLICK) 123XX123 (90 Base) MCG/ACT AEPB, Inhale 2 puffs into the lungs 4 (four) times daily as needed (wheeze, shortness of breath, bronchospams, cough)., Disp: 1 each, Rfl: 4   allopurinol (ZYLOPRIM) 100 MG  tablet, Take 2 tablets (200 mg total) by mouth daily., Disp: 180 tablet, Rfl: 3   Blood Pressure Monitoring (ADULT BLOOD PRESSURE CUFF LG) KIT, 1 each by Does not apply route daily., Disp: 1 kit, Rfl: 0   cholecalciferol (VITAMIN D3) 25 MCG (1000 UNIT) tablet, Take 1,000 Units by mouth daily., Disp: , Rfl:    ELIQUIS 5 MG TABS tablet, Take 5 mg by mouth 2 (two) times daily., Disp: , Rfl:    enalapril (VASOTEC) 2.5 MG tablet, Take 1 tablet (2.5 mg total) by mouth daily., Disp: 90 tablet, Rfl: 3   enalapril (VASOTEC) 5 MG tablet, Take 0.5 tablets (2.5 mg total) by mouth daily., Disp: 90 tablet, Rfl: 1   famotidine (PEPCID) 20 MG tablet, Take 20 mg by mouth daily as needed for heartburn or indigestion., Disp: , Rfl:    ferrous sulfate 325 (65 FE) MG EC tablet, Take 1 tablet (325 mg total) by mouth daily., Disp: 90 tablet, Rfl: 1   hydrochlorothiazide (MICROZIDE) 12.5 MG capsule, Take 1 capsule by mouth once daily, Disp: 90 capsule, Rfl: 1   lovastatin (MEVACOR) 40 MG tablet, TAKE 1 TABLET BY MOUTH AT BEDTIME, Disp: 90 tablet, Rfl: 3   mupirocin cream (BACTROBAN) 2 %, Apply 1 application topically 2 (two) times daily., Disp: 15 g, Rfl: 1   simethicone (GAS-X) 80 MG chewable tablet, Chew  1 tablet (80 mg total) by mouth 2 (two) times a day., Disp: 60 tablet, Rfl: 2   SYMBICORT 160-4.5 MCG/ACT inhaler, Inhale 2 puffs by mouth twice daily, Disp: 33 g, Rfl: 0   triamcinolone cream (KENALOG) 0.1 %, APPLY TO AFFECTED AREA TWICE DAILY IF NEEDED. NO MORE THAN 1 WEEK AT A TIME, THEN TAKE A BREAK FOR 2 WEEKS., Disp: 80 g, Rfl: 0  Patient Active Problem List   Diagnosis Date Noted   Acquired trigger finger of right middle finger 07/24/2021   Acquired trigger finger of right ring finger 07/24/2021   Trigger finger of right hand 06/26/2021   Hidradenitis suppurativa 11/12/2020   Status post gastroplasty-1980s 06/25/2020   CLE (columnar lined esophagus)    Hiatal hernia    Gastric nodule    Angiodysplasia of  stomach and duodenum    Cecal polyp    Polyp of descending colon    Polyp of transverse colon    Atrial fibrillation ([place]) 06/14/2019   Current use of anticoagulant therapy 06/14/2019   Iron deficiency anemia 06/14/2019   Thrombocytosis 06/14/2019   GERD (gastroesophageal reflux disease) 04/15/2019   Chronic low back pain 04/15/2019   Right pulmonary embolus ([place]) 04/15/2019   DOE (dyspnea on exertion) 03/13/2019   Atherosclerosis of both carotid arteries 09/20/2018   Spinal stenosis of lumbar region with neurogenic claudication 09/20/2018   Aortic atherosclerosis ([place]) 09/05/2018   At high risk for falls 09/05/2018   Morbid obesity with BMI of 45.0-49.9, adult ([place]) 08/29/2017   Osteopenia 05/18/2017   Stage 3 chronic kidney disease ([place]) 04/21/2017   Morbid obesity ([place]) 04/21/2017   Trigger middle finger of left hand 02/28/2017   Chronic gouty arthropathy without tophi 11/29/2016   Hypergammaglobulinemia 11/29/2016   Osteoarthritis of both knees 11/29/2016   Bilateral primary osteoarthritis of knee 11/29/2016   Allergic rhinitis, seasonal 07/30/2014   Gout 07/30/2014   HLD (hyperlipidemia) 07/30/2014   Osteoarthritis of knee 07/30/2014   Prediabetes 07/30/2014   Asthma, mild persistent 07/30/2014   COPD (chronic obstructive pulmonary disease) ([place]) 07/09/2008   Essential hypertension 06/22/2006    Past Surgical History:  Procedure Laterality Date   BACK SURGERY     BREAST BIOPSY Left 04/08/2014   intraductal papilloma 5 mm   COLONOSCOPY WITH PROPOFOL N/A 04/23/2020   Procedure: COLONOSCOPY WITH PROPOFOL;  Surgeon: Virgel Manifold, MD;  Location: [facility] ENDOSCOPY;  Service: Endoscopy;  Laterality: N/A;   ESOPHAGOGASTRODUODENOSCOPY (EGD) WITH PROPOFOL N/A 04/23/2020   Procedure: ESOPHAGOGASTRODUODENOSCOPY (EGD) WITH PROPOFOL;  Surgeon: Virgel Manifold, MD;  Location: [facility] ENDOSCOPY;  Service: Endoscopy;  Laterality: N/A;   GIVENS CAPSULE STUDY N/A 01/28/2022    Procedure: GIVENS CAPSULE STUDY;  Surgeon: Jonathon Bellows, MD;  Location: [facility] ENDOSCOPY;  Service: Gastroenterology;  Laterality: N/A;   LUMBAR LAMINECTOMY     TONSILLECTOMY      Family History  Problem Relation Age of Onset   Diabetes Mother    Lung cancer Father    Diabetes Sister    Congestive Heart Failure Sister    Glaucoma Sister    Breast cancer Maternal Aunt     Social History   Tobacco Use   Smoking status: Former    Packs/day: 1.50    Years: 20.00    Total pack years: 30.00    Types: Cigarettes    Quit date: 11/13/2008    Years since quitting: 13.3   Smokeless tobacco: Never   Tobacco comments:    smoking cessation materials not required  Vaping Use   Vaping Use: Never used  Substance Use Topics   Alcohol use: Not Currently   Drug use: No     No Known Allergies  Health Maintenance  Topic Date Due   DTaP/Tdap/Td (2 - Td or Tdap) 10/12/2021   COVID-19 Vaccine (5 - 2023-24 season) 04/15/2022 (Originally 12/21/2021)   Lung Cancer Screening  12/03/2022 (Originally 04/08/1996)   DEXA SCAN  03/30/2023 (Originally 05/19/2019)   MAMMOGRAM  10/09/2022   Medicare Annual Wellness (AWV)  12/03/2022   COLONOSCOPY (Pts 45-25yr Insurance coverage will need to be confirmed)  04/23/2025   Pneumonia Vaccine 77 Years old  Completed   INFLUENZA VACCINE  Completed   Hepatitis C Screening  Completed   HPV VACCINES  Aged Out   Fecal DNA (Cologuard)  Discontinued   Zoster Vaccines- Shingrix  Discontinued    Chart Review Today: I personally reviewed active problem list, medication list, allergies, family history, social history, health maintenance, notes from last encounter, lab results, imaging with the patient/caregiver today.   Review of Systems  Constitutional: Negative.   HENT: Negative.    Eyes: Negative.   Respiratory: Negative.    Cardiovascular: Negative.   Gastrointestinal: Negative.   Endocrine: Negative.   Genitourinary: Negative.   Musculoskeletal:  Negative.   Skin: Negative.   Allergic/Immunologic: Negative.   Neurological: Negative.   Hematological: Negative.   Psychiatric/Behavioral: Negative.    All other systems reviewed and are negative.    Objective:   Vitals:   03/30/22 0838  BP: 106/64  Pulse: 89  Resp: 16  Temp: 98.1 F (36.7 C)  TempSrc: Oral  SpO2: 99%  Weight: 233 lb 11.2 oz (106 kg)  Height: '5\' 6"'$  (1.676 m)    Body mass index is 37.72 kg/m.  Physical Exam Vitals and nursing note reviewed.  Constitutional:      General: She is not in acute distress.    Appearance: Normal appearance. She is well-developed and well-groomed. She is obese. She is not ill-appearing, toxic-appearing or diaphoretic.  HENT:     Head: Normocephalic and atraumatic.     Right Ear: External ear normal.     Left Ear: External ear normal.     Nose: Nose normal.  Eyes:     General: No scleral icterus.       Right eye: No discharge.        Left eye: No discharge.     Conjunctiva/sclera: Conjunctivae normal.  Cardiovascular:     Rate and Rhythm: Normal rate and regular rhythm.     Pulses: Normal pulses.     Heart sounds: Normal heart sounds. No murmur heard.    No friction rub. No gallop.  Pulmonary:     Effort: Pulmonary effort is normal. No respiratory distress.     Breath sounds: Normal breath sounds. No stridor. No wheezing or rales.  Abdominal:     General: Bowel sounds are normal.     Palpations: Abdomen is soft.  Skin:    General: Skin is warm.     Coloration: Skin is not jaundiced or pale.     Findings: No lesion.  Neurological:     Mental Status: She is alert. Mental status is at baseline.     Cranial Nerves: No dysarthria or facial asymmetry.     Coordination: Coordination is intact.     Comments: Walks with walker  Psychiatric:        Mood and Affect: Mood normal.  Behavior: Behavior normal. Behavior is cooperative.         Assessment & Plan:   Problem List Items Addressed This Visit        Cardiovascular and Mediastinum   Essential hypertension - Primary    Pt BP low normal, her med doses have been reduced by cardiology, now on 2.5 mg enalapril and HCTZ. BP Readings from Last 3 Encounters:  03/30/22 106/64  01/14/22 99/67  12/14/21 122/70        Relevant Orders   COMPLETE METABOLIC PANEL WITH GFR (Completed)   Right pulmonary embolus (HCC)    Hx of treated by hematology, on NOAC      Atrial fibrillation (HCC)    Paroxymal afib onset with hospitalization, she is following with cardiology On eliquis She is not having palpitations, previously intolerant of BB On exam heart RRR, no concerning sx Good cardiology follow up      Relevant Orders   COMPLETE METABOLIC PANEL WITH GFR (Completed)   CBC with Differential/Platelet (Completed)     Respiratory   COPD (chronic obstructive pulmonary disease) ([place]) (Chronic)    Stable on current inhalers, no recent exacerbations      Relevant Orders   CBC with Differential/Platelet (Completed)     Digestive   GERD (gastroesophageal reflux disease)    Sx well controlled with pepcid only, no indigestion, pain, melena/hematochezia Pt est with GI and just did capsule study        Genitourinary   Stage 3 chronic kidney disease ([place])    Monitoring, on enalapril      Relevant Orders   COMPLETE METABOLIC PANEL WITH GFR (Completed)     Hematopoietic and Hemostatic   Essential (hemorrhagic) thrombocythemia ([place])    Monitoring, per [facility]      Relevant Orders   CBC with Differential/Platelet (Completed)     Other   Prediabetes (Chronic)    Lab Results  Component Value Date   HGBA1C 5.5 09/01/2021  Last labs normal, she does not wish to recheck today, will do annually with next OV and labs       Relevant Orders   COMPLETE METABOLIC PANEL WITH GFR (Completed)   Morbid obesity (HCC) (Chronic)    BMI 37+ with multiple associated comorbiditis, HTN, HLD, COPD, Afib, limited by severe OA of knees, diet efforts  commended and encouraged her to continue, she eats a lot of vegetables      Relevant Orders   COMPLETE METABOLIC PANEL WITH GFR (Completed)   CBC with Differential/Platelet (Completed)   HLD (hyperlipidemia)    Last lipids at goal on lovastatin Labs annually with next OV Lab Results  Component Value Date   CHOL 167 09/01/2021   HDL 60 09/01/2021   LDLCALC 88 09/01/2021   TRIG 96 09/01/2021   CHOLHDL 2.8 09/01/2021        Relevant Orders   COMPLETE METABOLIC PANEL WITH GFR (Completed)   Iron deficiency anemia    [facility]  Will recheck labs Reviewed chart and last labs through care everywhere      Relevant Orders   CBC with Differential/Platelet (Completed)   Iron, TIBC and Ferritin Panel (Completed)   Other Visit Diagnoses     Abnormal urine odor       mentions odor at end of day despite good hygeine, no dysuria, hematuria, abd pain, flank pain genital/vaginal irritation/itching/discharge   Relevant Orders   Urinalysis, Routine w reflex microscopic (Completed)   POCT urinalysis dipstick (Completed)   Monitoring  for long-term anticoagulant use       Relevant Orders   COMPLETE METABOLIC PANEL WITH GFR (Completed)   CBC with Differential/Platelet (Completed)       Explained to pt that odor may need GU exam or other testing, I will screen urine and if its negative and symptoms persist she should return for further eval   Return in about 6 months (around 09/30/2022) for Routine follow-up.   Delsa Grana, PA-C 03/30/22 9:22 AM

## 2022-03-31 ENCOUNTER — Other Ambulatory Visit: Payer: Self-pay | Admitting: Family Medicine

## 2022-03-31 DIAGNOSIS — I700 Atherosclerosis of aorta: Secondary | ICD-10-CM

## 2022-03-31 DIAGNOSIS — J441 Chronic obstructive pulmonary disease with (acute) exacerbation: Secondary | ICD-10-CM

## 2022-03-31 DIAGNOSIS — E782 Mixed hyperlipidemia: Secondary | ICD-10-CM

## 2022-03-31 DIAGNOSIS — I10 Essential (primary) hypertension: Secondary | ICD-10-CM

## 2022-03-31 LAB — COMPLETE METABOLIC PANEL WITH GFR
AG Ratio: 1 (calc) (ref 1.0–2.5)
ALT: 10 U/L (ref 6–29)
AST: 19 U/L (ref 10–35)
Albumin: 3.9 g/dL (ref 3.6–5.1)
Alkaline phosphatase (APISO): 103 U/L (ref 37–153)
BUN/Creatinine Ratio: 26 (calc) — ABNORMAL HIGH (ref 6–22)
BUN: 26 mg/dL — ABNORMAL HIGH (ref 7–25)
CO2: 24 mmol/L (ref 20–32)
Calcium: 10 mg/dL (ref 8.6–10.4)
Chloride: 99 mmol/L (ref 98–110)
Creat: 1.01 mg/dL — ABNORMAL HIGH (ref 0.60–1.00)
Globulin: 4 g/dL (calc) — ABNORMAL HIGH (ref 1.9–3.7)
Glucose, Bld: 98 mg/dL (ref 65–99)
Potassium: 3.9 mmol/L (ref 3.5–5.3)
Sodium: 137 mmol/L (ref 135–146)
Total Bilirubin: 0.3 mg/dL (ref 0.2–1.2)
Total Protein: 7.9 g/dL (ref 6.1–8.1)
eGFR: 58 mL/min/{1.73_m2} — ABNORMAL LOW (ref >=60)

## 2022-03-31 LAB — URINALYSIS, ROUTINE W REFLEX MICROSCOPIC
Bilirubin Urine: NEGATIVE
Glucose, UA: NEGATIVE
Hgb urine dipstick: NEGATIVE
Ketones, ur: NEGATIVE
Leukocytes,Ua: NEGATIVE
Nitrite: NEGATIVE
Protein, ur: NEGATIVE
Specific Gravity, Urine: 1.016 (ref 1.001–1.035)
pH: 5.5 (ref 5.0–8.0)

## 2022-03-31 LAB — CBC WITH DIFFERENTIAL/PLATELET
Absolute Monocytes: 462 cells/uL (ref 200–950)
Basophils Absolute: 62 cells/uL (ref 0–200)
Basophils Relative: 0.8 %
Eosinophils Absolute: 46 cells/uL (ref 15–500)
Eosinophils Relative: 0.6 %
HCT: 38.2 % (ref 35.0–45.0)
Hemoglobin: 12.4 g/dL (ref 11.7–15.5)
Lymphs Abs: 3403 cells/uL (ref 850–3900)
MCH: 29 pg (ref 27.0–33.0)
MCHC: 32.5 g/dL (ref 32.0–36.0)
MCV: 89.3 fL (ref 80.0–100.0)
MPV: 9.7 fL (ref 7.5–12.5)
Monocytes Relative: 6 %
Neutro Abs: 3727 cells/uL (ref 1500–7800)
Neutrophils Relative %: 48.4 %
Platelets: 397 10*3/uL (ref 140–400)
RBC: 4.28 10*6/uL (ref 3.80–5.10)
RDW: 12.9 % (ref 11.0–15.0)
Total Lymphocyte: 44.2 %
WBC: 7.7 10*3/uL (ref 3.8–10.8)

## 2022-03-31 LAB — IRON,TIBC AND FERRITIN PANEL
%SAT: 18 % (calc) (ref 16–45)
Ferritin: 91 ng/mL (ref 16–288)
Iron: 56 ug/dL (ref 45–160)
TIBC: 304 mcg/dL (calc) (ref 250–450)

## 2022-03-31 MED ORDER — LOVASTATIN 40 MG PO TABS: 40.0000 mg | ORAL_TABLET | Freq: Every day | ORAL | 1 refills | Status: DC

## 2022-03-31 MED ORDER — HYDROCHLOROTHIAZIDE 12.5 MG PO CAPS: 12.5000 mg | ORAL_CAPSULE | Freq: Every day | ORAL | 1 refills | Status: DC

## 2022-03-31 MED ORDER — SYMBICORT 160-4.5 MCG/ACT IN AERO: 2.0000 | INHALATION_SPRAY | Freq: Two times a day (BID) | RESPIRATORY_TRACT | 1 refills | Status: DC

## 2022-04-01 ENCOUNTER — Telehealth: Payer: Self-pay

## 2022-04-01 NOTE — Assessment & Plan Note (Signed)
Hx of treated by hematology, on NOAC

## 2022-04-01 NOTE — Telephone Encounter (Signed)
  Pt returned call for lab results. Shared providers note.  Delsa Grana, PA-C 03/31/2022  5:32 PM EST     Please let the pt know her labs were good.   Her kidney function looked good - better than previously Blood sugar normal, liver enzymes normal Her blood counts and iron were normal The urine testing was also completely normal - nothing there explains the odor   No med changes from [place] - I made sure she had refills on everything we prescribe (put in inhaler, cholesterol med, HCTZ) and we'll see her in 6 months

## 2022-04-01 NOTE — Assessment & Plan Note (Signed)
Stable on current inhalers, no recent exacerbations

## 2022-04-01 NOTE — Assessment & Plan Note (Signed)
Paroxymal afib onset with hospitalization, she is following with cardiology On eliquis She is not having palpitations, previously intolerant of BB On exam heart RRR, no concerning sx Good cardiology follow up

## 2022-04-01 NOTE — Assessment & Plan Note (Signed)
Pt BP low normal, her med doses have been reduced by cardiology, now on 2.5 mg enalapril and HCTZ. BP Readings from Last 3 Encounters:  03/30/22 106/64  01/14/22 99/67  12/14/21 122/70

## 2022-04-01 NOTE — Assessment & Plan Note (Signed)
Sx well controlled with pepcid only, no indigestion, pain, melena/hematochezia Pt est with GI and just did capsule study

## 2022-04-01 NOTE — Assessment & Plan Note (Signed)
[facility]  Will recheck labs Reviewed chart and last labs through care everywhere

## 2022-04-01 NOTE — Assessment & Plan Note (Signed)
Last lipids at goal on lovastatin Labs annually with next OV Lab Results  Component Value Date   CHOL 167 09/01/2021   HDL 60 09/01/2021   LDLCALC 88 09/01/2021   TRIG 96 09/01/2021   CHOLHDL 2.8 09/01/2021

## 2022-04-01 NOTE — Assessment & Plan Note (Signed)
Monitoring, per [facility]

## 2022-04-01 NOTE — Assessment & Plan Note (Signed)
Lab Results  Component Value Date   HGBA1C 5.5 09/01/2021  Last labs normal, she does not wish to recheck today, will do annually with next OV and labs

## 2022-04-01 NOTE — Assessment & Plan Note (Signed)
Monitoring, on enalapril

## 2022-04-01 NOTE — Assessment & Plan Note (Signed)
BMI 37+ with multiple associated comorbiditis, HTN, HLD, COPD, Afib, limited by severe OA of knees, diet efforts commended and encouraged her to continue, she eats a lot of vegetables

## 2022-04-15 ENCOUNTER — Ambulatory Visit: Payer: PPO | Admitting: Gastroenterology

## 2022-04-15 ENCOUNTER — Telehealth: Payer: Self-pay

## 2022-04-15 NOTE — Telephone Encounter (Signed)
Dr. Vicente Males wanted me to call patient to let her know that Kieth Brightly mentioned to him that records of given capsule studies before 01/29/2022 have been lost and have not been able to get back. Therefore, patient would need to repeat at no charge. I then called her to give her the unfortunate news and patient was very upset. She stated that she hated prepping for the test and that she was not going to repeat it and that she did all she was needing to do for prepping and had a horrible time with it. I asked the patient to please reconsider the chance of repeating her givens capsule study and she stated that she would get back with me if she decides on doing so. Dr. Vicente Males was informed of this situation.

## 2022-04-19 ENCOUNTER — Telehealth: Payer: Self-pay | Admitting: Family Medicine

## 2022-04-19 MED ORDER — BREZTRI AEROSPHERE 160-9-4.8 MCG/ACT IN AERO: 2.0000 | INHALATION_SPRAY | Freq: Two times a day (BID) | RESPIRATORY_TRACT | 1 refills | Status: DC

## 2022-04-19 NOTE — Addendum Note (Signed)
Addended by: Delsa Grana on: 04/19/2022 06:20 PM   Modules accepted: Orders

## 2022-04-19 NOTE — Telephone Encounter (Signed)
Copied from [place] [id number]. Topic: General - Other >> Apr 19, 2022 12:33 PM Dominique A wrote: Reason for CRM: Pt states that she received a letter from her insurance company that they will no longer cover her medication: SYMBICORT 160-4.5 MCG/ACT inhaler.  Per her insurance [facility] [employer]) they will cover Breztri . Please advise.   Pt states that she was seen on 03/30/2022 and she know her PCP sent over for Symbicort and she is needing it switched since her insurance will no longer cover it. Pt also state that she did not get a AWV at this appt  03/30/22 and is wanting to make sure she get one at her next appt.

## 2022-04-19 NOTE — Telephone Encounter (Signed)
Please advice of inhaler

## 2022-04-20 DIAGNOSIS — M1711 Unilateral primary osteoarthritis, right knee: Secondary | ICD-10-CM | POA: Diagnosis not present

## 2022-04-20 DIAGNOSIS — K5520 Angiodysplasia of colon without hemorrhage: Secondary | ICD-10-CM | POA: Diagnosis not present

## 2022-04-20 DIAGNOSIS — I2699 Other pulmonary embolism without acute cor pulmonale: Secondary | ICD-10-CM | POA: Diagnosis not present

## 2022-04-20 DIAGNOSIS — D500 Iron deficiency anemia secondary to blood loss (chronic): Secondary | ICD-10-CM | POA: Diagnosis not present

## 2022-04-20 NOTE — Telephone Encounter (Signed)
Lvm that she needs to schedule a Medicare Wellness Visit with the Nurse Advisor

## 2022-05-04 DIAGNOSIS — M170 Bilateral primary osteoarthritis of knee: Secondary | ICD-10-CM | POA: Diagnosis not present

## 2022-05-04 DIAGNOSIS — M25561 Pain in right knee: Secondary | ICD-10-CM | POA: Diagnosis not present

## 2022-05-10 DIAGNOSIS — M1711 Unilateral primary osteoarthritis, right knee: Secondary | ICD-10-CM | POA: Diagnosis not present

## 2022-05-10 DIAGNOSIS — Z0189 Encounter for other specified special examinations: Secondary | ICD-10-CM | POA: Diagnosis not present

## 2022-05-14 ENCOUNTER — Ambulatory Visit (INDEPENDENT_AMBULATORY_CARE_PROVIDER_SITE_OTHER): Payer: PPO | Admitting: Family Medicine

## 2022-05-14 ENCOUNTER — Encounter: Payer: Self-pay | Admitting: Family Medicine

## 2022-05-14 VITALS — BP 138/72 | HR 86 | Temp 97.5°F | Resp 16 | Ht 66.0 in | Wt 226.7 lb

## 2022-05-14 DIAGNOSIS — J441 Chronic obstructive pulmonary disease with (acute) exacerbation: Secondary | ICD-10-CM

## 2022-05-14 DIAGNOSIS — M1711 Unilateral primary osteoarthritis, right knee: Secondary | ICD-10-CM | POA: Diagnosis not present

## 2022-05-14 DIAGNOSIS — R7303 Prediabetes: Secondary | ICD-10-CM

## 2022-05-14 DIAGNOSIS — Z01818 Encounter for other preprocedural examination: Secondary | ICD-10-CM

## 2022-05-14 DIAGNOSIS — I10 Essential (primary) hypertension: Secondary | ICD-10-CM

## 2022-05-14 DIAGNOSIS — Z86718 Personal history of other venous thrombosis and embolism: Secondary | ICD-10-CM | POA: Diagnosis not present

## 2022-05-14 DIAGNOSIS — Z7901 Long term (current) use of anticoagulants: Secondary | ICD-10-CM | POA: Diagnosis not present

## 2022-05-14 NOTE — Progress Notes (Signed)
Patient ID: Colleen [name], female    DOB: [birth year], 77 y.o.   MRN: [id number]  PCP: Danelle Berry, PA-C  Chief Complaint  Patient presents with   vaginal odor    Ongoing for a while.   Pre-op Exam    Subjective:   Colleen [name] is a 77 y.o. female, presents to clinic with CC of the following:  HPI  Here for surgical clearance, scheduled for right total knee May 30th with Dr. Odis Luster She is getting clearance from cardiology and hematology - on eliquis for unprovoked DVT/PE (long term) Some labs were recently done here and through care everywhere Forms from ortho ask for recent CMP, CBC, urinalysis, A1C  Surgery will have spinal and she will not be intubated   Patient Active Problem List   Diagnosis Date Noted   Acquired trigger finger of right middle finger 07/24/2021   Acquired trigger finger of right ring finger 07/24/2021   Trigger finger of right hand 06/26/2021   Hidradenitis suppurativa 11/12/2020   Status post gastroplasty-1980s 06/25/2020   CLE (columnar lined esophagus)    Hiatal hernia    Gastric nodule    Angiodysplasia of stomach and duodenum    Cecal polyp    Polyp of descending colon    Polyp of transverse colon    Atrial fibrillation 06/14/2019   Current use of anticoagulant therapy 06/14/2019   Iron deficiency anemia 06/14/2019   Essential (hemorrhagic) thrombocythemia 06/14/2019   GERD (gastroesophageal reflux disease) 04/15/2019   Chronic low back pain 04/15/2019   Right pulmonary embolus 04/15/2019   DOE (dyspnea on exertion) 03/13/2019   Atherosclerosis of both carotid arteries 09/20/2018   Spinal stenosis of lumbar region with neurogenic claudication 09/20/2018   Aortic atherosclerosis 09/05/2018   At high risk for falls 09/05/2018   Morbid obesity with BMI of 45.0-49.9, adult 08/29/2017   Osteopenia 05/18/2017   Stage 3 chronic kidney disease 04/21/2017   Morbid obesity 04/21/2017   Trigger middle finger of left hand 02/28/2017    Chronic gouty arthropathy without tophi 11/29/2016   Hypergammaglobulinemia 11/29/2016   Osteoarthritis of both knees 11/29/2016   Bilateral primary osteoarthritis of knee 11/29/2016   Allergic rhinitis, seasonal 07/30/2014   Gout 07/30/2014   HLD (hyperlipidemia) 07/30/2014   Osteoarthritis of knee 07/30/2014   Prediabetes 07/30/2014   Asthma, mild persistent 07/30/2014   COPD (chronic obstructive pulmonary disease) 07/09/2008   Essential hypertension 06/22/2006      Current Outpatient Medications:    acetaminophen (TYLENOL) 650 MG CR tablet, Take 650 mg by mouth every 8 (eight) hours as needed for pain., Disp: , Rfl:    albuterol (VENTOLIN HFA) 108 (90 Base) MCG/ACT inhaler, Inhale 2 puffs into the lungs every 4 (four) hours as needed for wheezing or shortness of breath., Disp: 8 g, Rfl: 2   allopurinol (ZYLOPRIM) 100 MG tablet, Take 2 tablets (200 mg total) by mouth daily., Disp: 180 tablet, Rfl: 3   Blood Pressure Monitoring (ADULT BLOOD PRESSURE CUFF LG) KIT, 1 each by Does not apply route daily., Disp: 1 kit, Rfl: 0   Budeson-Glycopyrrol-Formoterol (BREZTRI AEROSPHERE) 160-9-4.8 MCG/ACT AERO, Inhale 2 puffs into the lungs 2 (two) times daily., Disp: 3 each, Rfl: 1   cholecalciferol (VITAMIN D3) 25 MCG (1000 UNIT) tablet, Take 1,000 Units by mouth daily., Disp: , Rfl:    ELIQUIS 5 MG TABS tablet, Take 5 mg by mouth 2 (two) times daily., Disp: , Rfl:    enalapril (VASOTEC) 2.5 MG tablet,  Take 1 tablet (2.5 mg total) by mouth daily., Disp: 90 tablet, Rfl: 3   famotidine (PEPCID) 20 MG tablet, Take 20 mg by mouth daily as needed for heartburn or indigestion., Disp: , Rfl:    hydrochlorothiazide (MICROZIDE) 12.5 MG capsule, Take 1 capsule (12.5 mg total) by mouth daily., Disp: 90 capsule, Rfl: 1   lovastatin (MEVACOR) 40 MG tablet, Take 1 tablet (40 mg total) by mouth at bedtime., Disp: 90 tablet, Rfl: 1   mupirocin cream (BACTROBAN) 2 %, Apply 1 application topically 2 (two) times  daily., Disp: 15 g, Rfl: 1   simethicone (GAS-X) 80 MG chewable tablet, Chew 1 tablet (80 mg total) by mouth 2 (two) times a day., Disp: 60 tablet, Rfl: 2   triamcinolone cream (KENALOG) 0.1 %, APPLY TO AFFECTED AREA TWICE DAILY IF NEEDED. NO MORE THAN 1 WEEK AT A TIME, THEN TAKE A BREAK FOR 2 WEEKS., Disp: 80 g, Rfl: 0   No Known Allergies   Social History   Tobacco Use   Smoking status: Former    Packs/day: 1.50    Years: 20.00    Additional pack years: 0.00    Total pack years: 30.00    Types: Cigarettes    Quit date: 11/13/2008    Years since quitting: 13.5   Smokeless tobacco: Never   Tobacco comments:    smoking cessation materials not required  Vaping Use   Vaping Use: Never used  Substance Use Topics   Alcohol use: Not Currently   Drug use: No      Chart Review Today: I personally reviewed active problem list, medication list, allergies, family history, social history, health maintenance, notes from last encounter, lab results, imaging with the patient/caregiver today.   Review of Systems  Constitutional: Negative.   HENT: Negative.    Eyes: Negative.   Respiratory: Negative.    Cardiovascular: Negative.   Gastrointestinal: Negative.   Endocrine: Negative.   Genitourinary: Negative.   Musculoskeletal: Negative.   Skin: Negative.   Allergic/Immunologic: Negative.   Neurological: Negative.   Hematological: Negative.   Psychiatric/Behavioral: Negative.    All other systems reviewed and are negative.      Objective:   Vitals:   05/14/22 1030  BP: 138/72  Pulse: 86  Resp: 16  Temp: (!) 97.5 F (36.4 C)  TempSrc: Oral  SpO2: 97%  Weight: 226 lb 11.2 oz (102.8 kg)  Height:  (1.676 m)    Body mass index is 36.59 kg/m.  Physical Exam Vitals and nursing note reviewed.  Constitutional:      General: She is not in acute distress.    Appearance: She is obese. She is not ill-appearing, toxic-appearing or diaphoretic.  HENT:     Head:  Normocephalic and atraumatic.     Right Ear: External ear normal.     Left Ear: External ear normal.     Nose: Nose normal.  Eyes:     General: No scleral icterus.       Right eye: No discharge.        Left eye: No discharge.     Conjunctiva/sclera: Conjunctivae normal.  Cardiovascular:     Rate and Rhythm: Normal rate and regular rhythm.     Pulses: Normal pulses.     Heart sounds: Normal heart sounds.  Pulmonary:     Effort: Pulmonary effort is normal.     Breath sounds: Normal breath sounds.  Abdominal:     General: Bowel sounds are normal.  Palpations: Abdomen is soft.  Musculoskeletal:     Right lower leg: No edema.     Left lower leg: No edema.  Skin:    General: Skin is warm and dry.     Capillary Refill: Capillary refill takes less than 2 seconds.  Neurological:     Mental Status: She is alert. Mental status is at baseline.     Gait: Gait abnormal.  Psychiatric:        Mood and Affect: Mood normal.        Behavior: Behavior normal.      Results for orders placed or performed in visit on 03/30/22  COMPLETE METABOLIC PANEL WITH GFR  Result Value Ref Range   Glucose, Bld 98 65 - 99 mg/dL   BUN 26 (H) 7 - 25 mg/dL   Creat 1.61 (H) 0.96 - 1.00 mg/dL   eGFR 58 (L) > OR = 60 mL/min/1.66m2   BUN/Creatinine Ratio 26 (H) 6 - 22 (calc)   Sodium 137 135 - 146 mmol/L   Potassium 3.9 3.5 - 5.3 mmol/L   Chloride 99 98 - 110 mmol/L   CO2 24 20 - 32 mmol/L   Calcium 10.0 8.6 - 10.4 mg/dL   Total Protein 7.9 6.1 - 8.1 g/dL   Albumin 3.9 3.6 - 5.1 g/dL   Globulin 4.0 (H) 1.9 - 3.7 g/dL (calc)   AG Ratio 1.0 1.0 - 2.5 (calc)   Total Bilirubin 0.3 0.2 - 1.2 mg/dL   Alkaline phosphatase (APISO) 103 37 - 153 U/L   AST 19 10 - 35 U/L   ALT 10 6 - 29 U/L  Urinalysis, Routine w reflex microscopic  Result Value Ref Range   Color, Urine YELLOW YELLOW   APPearance CLEAR CLEAR   Specific Gravity, Urine 1.016 1.001 - 1.035   pH 5.5 5.0 - 8.0   Glucose, UA NEGATIVE NEGATIVE    Bilirubin Urine NEGATIVE NEGATIVE   Ketones, ur NEGATIVE NEGATIVE   Hgb urine dipstick NEGATIVE NEGATIVE   Protein, ur NEGATIVE NEGATIVE   Nitrite NEGATIVE NEGATIVE   Leukocytes,Ua NEGATIVE NEGATIVE  CBC with Differential/Platelet  Result Value Ref Range   WBC 7.7 3.8 - 10.8 Thousand/uL   RBC 4.28 3.80 - 5.10 Million/uL   Hemoglobin 12.4 11.7 - 15.5 g/dL   HCT 04.5 40.9 - 81.1 %   MCV 89.3 80.0 - 100.0 fL   MCH 29.0 27.0 - 33.0 pg   MCHC 32.5 32.0 - 36.0 g/dL   RDW 91.4 78.2 - 95.6 %   Platelets 397 140 - 400 Thousand/uL   MPV 9.7 7.5 - 12.5 fL   Neutro Abs 3,727 1,500 - 7,800 cells/uL   Lymphs Abs 3,403 850 - 3,900 cells/uL   Absolute Monocytes 462 200 - 950 cells/uL   Eosinophils Absolute 46 15 - 500 cells/uL   Basophils Absolute 62 0 - 200 cells/uL   Neutrophils Relative % 48.4 %   Total Lymphocyte 44.2 %   Monocytes Relative 6.0 %   Eosinophils Relative 0.6 %   Basophils Relative 0.8 %  Iron, TIBC and Ferritin Panel  Result Value Ref Range   Iron 56 45 - 160 mcg/dL   TIBC 213 086 - 578 mcg/dL (calc)   %SAT 18 16 - 45 % (calc)   Ferritin 91 16 - 288 ng/mL  POCT urinalysis dipstick  Result Value Ref Range   Color, UA Yellow    Clarity, UA Clear    Glucose, UA Negative Negative   Bilirubin, UA  Negative    Ketones, UA Negative    Spec Grav, UA 1.010 1.010 - 1.025   Blood, UA Trace    pH, UA 5.0 5.0 - 8.0   Protein, UA Negative Negative   Urobilinogen, UA 0.2 0.2 or 1.0 E.U./dL   Nitrite, UA Negative    Leukocytes, UA Negative Negative   Appearance Yellow    Odor None        Assessment & Plan:     ICD-10-CM   1. Preoperative clearance  Z01.818 COMPLETE METABOLIC PANEL WITH GFR    Hemoglobin A1c    Urinalysis, Routine w reflex microscopic   needed labs ordered, forms to be completed when resulted, will need hematology clearance    2. Prediabetes  R73.03 COMPLETE METABOLIC PANEL WITH GFR    Hemoglobin A1c   recheck labs    3. Essential hypertension  I10  COMPLETE METABOLIC PANEL WITH GFR   BP above goal, however acceptable for age    42. Chronic obstructive pulmonary disease with acute exacerbation  J44.1    insurance switched her covered inhaler, no longer symbicort, breztri has previously been sent in pt did not pick it up yet    5. Long term (current) use of anticoagulants  Z79.01    anticoagulation and bridging for surgery per hematology [facility]    6. History of venous thromboembolism  Z86.718    per hem    7. Osteoarthritis of right knee, unspecified osteoarthritis type  M17.11    pending total knee          Danelle Berry, PA-C 05/14/22 10:46 AM

## 2022-05-15 LAB — COMPLETE METABOLIC PANEL WITH GFR
AG Ratio: 1.2 (calc) (ref 1.0–2.5)
ALT: 10 U/L (ref 6–29)
AST: 19 U/L (ref 10–35)
Albumin: 4 g/dL (ref 3.6–5.1)
Alkaline phosphatase (APISO): 100 U/L (ref 37–153)
BUN/Creatinine Ratio: 22 (calc) (ref 6–22)
BUN: 23 mg/dL (ref 7–25)
CO2: 26 mmol/L (ref 20–32)
Calcium: 10.3 mg/dL (ref 8.6–10.4)
Chloride: 100 mmol/L (ref 98–110)
Creat: 1.06 mg/dL — ABNORMAL HIGH (ref 0.60–1.00)
Globulin: 3.3 g/dL (calc) (ref 1.9–3.7)
Glucose, Bld: 91 mg/dL (ref 65–99)
Potassium: 4.1 mmol/L (ref 3.5–5.3)
Sodium: 136 mmol/L (ref 135–146)
Total Bilirubin: 0.3 mg/dL (ref 0.2–1.2)
Total Protein: 7.3 g/dL (ref 6.1–8.1)
eGFR: 54 mL/min/{1.73_m2} — ABNORMAL LOW (ref >=60)

## 2022-05-15 LAB — URINALYSIS, ROUTINE W REFLEX MICROSCOPIC
Bilirubin Urine: NEGATIVE
Glucose, UA: NEGATIVE
Hgb urine dipstick: NEGATIVE
Ketones, ur: NEGATIVE
Nitrite: POSITIVE — AB
Specific Gravity, Urine: 1.023 (ref 1.001–1.035)
WBC, UA: >=60 /HPF — AB (ref 0–5)
pH: 6 (ref 5.0–8.0)

## 2022-05-15 LAB — MICROSCOPIC MESSAGE

## 2022-05-15 LAB — HEMOGLOBIN A1C
Hgb A1c MFr Bld: 5.9 % of total Hgb — ABNORMAL HIGH (ref <5.7)
Mean Plasma Glucose: 123 mg/dL
eAG (mmol/L): 6.8 mmol/L

## 2022-05-17 ENCOUNTER — Encounter: Payer: Self-pay | Admitting: Family Medicine

## 2022-05-17 ENCOUNTER — Other Ambulatory Visit: Payer: Self-pay | Admitting: Family Medicine

## 2022-05-17 DIAGNOSIS — N390 Urinary tract infection, site not specified: Secondary | ICD-10-CM

## 2022-05-17 MED ORDER — CEPHALEXIN 500 MG PO CAPS: 500.0000 mg | ORAL_CAPSULE | Freq: Two times a day (BID) | ORAL | 0 refills | Status: AC

## 2022-05-27 ENCOUNTER — Telehealth: Payer: Self-pay | Admitting: Family Medicine

## 2022-05-27 NOTE — Telephone Encounter (Signed)
Called patient to schedule Medicare Annual Wellness Visit (AWV). No voicemail available to leave a message.  Last date of AWV: 12/02/2021  Please schedule an appointment at any time with [facility].  If any questions, please contact me at [phone].  Thank you ,   Randon Goldsmith Care Guide [facility] AWV TEAM Direct Dial: [phone]

## 2022-06-24 DIAGNOSIS — Z7901 Long term (current) use of anticoagulants: Secondary | ICD-10-CM | POA: Diagnosis not present

## 2022-06-24 DIAGNOSIS — M189 Osteoarthritis of first carpometacarpal joint, unspecified: Secondary | ICD-10-CM | POA: Diagnosis not present

## 2022-06-24 DIAGNOSIS — Z886 Allergy status to analgesic agent status: Secondary | ICD-10-CM | POA: Diagnosis not present

## 2022-06-24 DIAGNOSIS — M1711 Unilateral primary osteoarthritis, right knee: Secondary | ICD-10-CM | POA: Diagnosis not present

## 2022-06-24 DIAGNOSIS — I129 Hypertensive chronic kidney disease with stage 1 through stage 4 chronic kidney disease, or unspecified chronic kidney disease: Secondary | ICD-10-CM | POA: Diagnosis not present

## 2022-06-24 DIAGNOSIS — Z87891 Personal history of nicotine dependence: Secondary | ICD-10-CM | POA: Diagnosis not present

## 2022-06-24 DIAGNOSIS — M21061 Valgus deformity, not elsewhere classified, right knee: Secondary | ICD-10-CM | POA: Diagnosis not present

## 2022-06-24 DIAGNOSIS — M109 Gout, unspecified: Secondary | ICD-10-CM | POA: Diagnosis not present

## 2022-06-24 DIAGNOSIS — Z9884 Bariatric surgery status: Secondary | ICD-10-CM | POA: Diagnosis not present

## 2022-06-24 DIAGNOSIS — Z8719 Personal history of other diseases of the digestive system: Secondary | ICD-10-CM | POA: Diagnosis not present

## 2022-06-24 DIAGNOSIS — G8918 Other acute postprocedural pain: Secondary | ICD-10-CM | POA: Diagnosis not present

## 2022-06-24 DIAGNOSIS — E785 Hyperlipidemia, unspecified: Secondary | ICD-10-CM | POA: Diagnosis not present

## 2022-06-24 DIAGNOSIS — J4489 Other specified chronic obstructive pulmonary disease: Secondary | ICD-10-CM | POA: Diagnosis not present

## 2022-06-24 DIAGNOSIS — I480 Paroxysmal atrial fibrillation: Secondary | ICD-10-CM | POA: Diagnosis not present

## 2022-06-24 DIAGNOSIS — N183 Chronic kidney disease, stage 3 unspecified: Secondary | ICD-10-CM | POA: Diagnosis not present

## 2022-06-24 DIAGNOSIS — D72829 Elevated white blood cell count, unspecified: Secondary | ICD-10-CM | POA: Diagnosis not present

## 2022-06-24 DIAGNOSIS — I10 Essential (primary) hypertension: Secondary | ICD-10-CM | POA: Diagnosis not present

## 2022-06-24 DIAGNOSIS — Z7951 Long term (current) use of inhaled steroids: Secondary | ICD-10-CM | POA: Diagnosis not present

## 2022-06-24 DIAGNOSIS — K5900 Constipation, unspecified: Secondary | ICD-10-CM | POA: Diagnosis not present

## 2022-06-24 DIAGNOSIS — J45909 Unspecified asthma, uncomplicated: Secondary | ICD-10-CM | POA: Diagnosis not present

## 2022-06-24 DIAGNOSIS — Z79899 Other long term (current) drug therapy: Secondary | ICD-10-CM | POA: Diagnosis not present

## 2022-06-24 DIAGNOSIS — J449 Chronic obstructive pulmonary disease, unspecified: Secondary | ICD-10-CM | POA: Diagnosis not present

## 2022-06-24 DIAGNOSIS — Z86711 Personal history of pulmonary embolism: Secondary | ICD-10-CM | POA: Diagnosis not present

## 2022-06-24 DIAGNOSIS — Z9889 Other specified postprocedural states: Secondary | ICD-10-CM | POA: Diagnosis not present

## 2022-06-24 DIAGNOSIS — K219 Gastro-esophageal reflux disease without esophagitis: Secondary | ICD-10-CM | POA: Diagnosis not present

## 2022-06-24 DIAGNOSIS — D75839 Thrombocytosis, unspecified: Secondary | ICD-10-CM | POA: Diagnosis not present

## 2022-06-24 DIAGNOSIS — D509 Iron deficiency anemia, unspecified: Secondary | ICD-10-CM | POA: Diagnosis not present

## 2022-06-30 DIAGNOSIS — D509 Iron deficiency anemia, unspecified: Secondary | ICD-10-CM | POA: Diagnosis not present

## 2022-06-30 DIAGNOSIS — E785 Hyperlipidemia, unspecified: Secondary | ICD-10-CM | POA: Diagnosis not present

## 2022-06-30 DIAGNOSIS — I1310 Hypertensive heart and chronic kidney disease without heart failure, with stage 1 through stage 4 chronic kidney disease, or unspecified chronic kidney disease: Secondary | ICD-10-CM | POA: Diagnosis not present

## 2022-06-30 DIAGNOSIS — Z471 Aftercare following joint replacement surgery: Secondary | ICD-10-CM | POA: Diagnosis not present

## 2022-06-30 DIAGNOSIS — I480 Paroxysmal atrial fibrillation: Secondary | ICD-10-CM | POA: Diagnosis not present

## 2022-06-30 DIAGNOSIS — Z96651 Presence of right artificial knee joint: Secondary | ICD-10-CM | POA: Diagnosis not present

## 2022-06-30 DIAGNOSIS — Z79891 Long term (current) use of opiate analgesic: Secondary | ICD-10-CM | POA: Diagnosis not present

## 2022-06-30 DIAGNOSIS — Z7901 Long term (current) use of anticoagulants: Secondary | ICD-10-CM | POA: Diagnosis not present

## 2022-06-30 DIAGNOSIS — N183 Chronic kidney disease, stage 3 unspecified: Secondary | ICD-10-CM | POA: Diagnosis not present

## 2022-06-30 DIAGNOSIS — J449 Chronic obstructive pulmonary disease, unspecified: Secondary | ICD-10-CM | POA: Diagnosis not present

## 2022-07-20 ENCOUNTER — Telehealth: Payer: Self-pay | Admitting: Family Medicine

## 2022-07-20 NOTE — Telephone Encounter (Signed)
Elmer Bales with [facility] is calling in because pt says about two nights ago it began to sting when she urinated. Pt says there is also a smell to her urine as well. Pt says she was treated about a month ago for a UTI and Irving Burton was calling to see if pt's PCP wanted pt to do a urine sample or if she could send in an antibiotic. Please advise.

## 2022-07-20 NOTE — Telephone Encounter (Signed)
Colleen [name] spoke to the pt and she has been taken care of

## 2022-07-20 NOTE — Telephone Encounter (Signed)
Pt is scheduled for Monday 07/26/22. Pt cannot come in before then

## 2022-07-23 NOTE — Progress Notes (Unsigned)
There were no vitals taken for this visit.   Subjective:    Patient ID: Colleen [name], female    DOB: May 13, 1945, 77 y.o.   MRN: [id number]  HPI: Colleen [name] is a 77 y.o. female  No chief complaint on file.   Relevant past medical, surgical, family and social history reviewed and updated as indicated. Interim medical history since our last visit reviewed. Allergies and medications reviewed and updated.  Review of Systems  Constitutional: Negative for fever or weight change.  Respiratory: Negative for cough and shortness of breath.   Cardiovascular: Negative for chest pain or palpitations.  Gastrointestinal: Negative for abdominal pain, no bowel changes.  Musculoskeletal: Negative for gait problem or joint swelling.  Skin: Negative for rash.  Neurological: Negative for dizziness or headache.  No other specific complaints in a complete review of systems (except as listed in HPI above).      Objective:    There were no vitals taken for this visit.  Wt Readings from Last 3 Encounters:  05/14/22 226 lb 11.2 oz (102.8 kg)  03/30/22 233 lb 11.2 oz (106 kg)  01/14/22 238 lb (108 kg)    Physical Exam  Constitutional: Patient appears well-developed and well-nourished. Obese *** No distress.  HEENT: head atraumatic, normocephalic, pupils equal and reactive to light, ears ***, neck supple, throat within normal limits Cardiovascular: Normal rate, regular rhythm and normal heart sounds.  No murmur heard. No BLE edema. Pulmonary/Chest: Effort normal and breath sounds normal. No respiratory distress. Abdominal: Soft.  There is no tenderness. Psychiatric: Patient has a normal mood and affect. behavior is normal. Judgment and thought content normal.  Results for orders placed or performed in visit on 05/14/22  MICROSCOPIC MESSAGE  Result Value Ref Range   Note    COMPLETE METABOLIC PANEL WITH GFR  Result Value Ref Range   Glucose, Bld 91 65 - 99 mg/dL   BUN 23 7 - 25 mg/dL    Creat 4.09 (H) 8.11 - 1.00 mg/dL   eGFR 54 (L) > OR = 60 mL/min/1.59m2   BUN/Creatinine Ratio 22 6 - 22 (calc)   Sodium 136 135 - 146 mmol/L   Potassium 4.1 3.5 - 5.3 mmol/L   Chloride 100 98 - 110 mmol/L   CO2 26 20 - 32 mmol/L   Calcium 10.3 8.6 - 10.4 mg/dL   Total Protein 7.3 6.1 - 8.1 g/dL   Albumin 4.0 3.6 - 5.1 g/dL   Globulin 3.3 1.9 - 3.7 g/dL (calc)   AG Ratio 1.2 1.0 - 2.5 (calc)   Total Bilirubin 0.3 0.2 - 1.2 mg/dL   Alkaline phosphatase (APISO) 100 37 - 153 U/L   AST 19 10 - 35 U/L   ALT 10 6 - 29 U/L  Hemoglobin A1c  Result Value Ref Range   Hgb A1c MFr Bld 5.9 (H) <5.7 % of total Hgb   Mean Plasma Glucose 123 mg/dL   eAG (mmol/L) 6.8 mmol/L  Urinalysis, Routine w reflex microscopic  Result Value Ref Range   Color, Urine DARK YELLOW YELLOW   APPearance CLOUDY (A) CLEAR   Specific Gravity, Urine 1.023 1.001 - 1.035   pH 6.0 5.0 - 8.0   Glucose, UA NEGATIVE NEGATIVE   Bilirubin Urine NEGATIVE NEGATIVE   Ketones, ur NEGATIVE NEGATIVE   Hgb urine dipstick NEGATIVE NEGATIVE   Protein, ur TRACE (A) NEGATIVE   Nitrite POSITIVE (A) NEGATIVE   Leukocytes,Ua 2+ (A) NEGATIVE   WBC, UA > OR =  60 (A) 0 - 5 /HPF   RBC / HPF 0-2 0 - 2 /HPF   Squamous Epithelial / HPF 0-5 < OR = 5 /HPF   Bacteria, UA MANY (A) NONE SEEN /HPF   Hyaline Cast 0-5 (A) NONE SEEN /LPF      Assessment & Plan:   Problem List Items Addressed This Visit   None    Follow up plan: No follow-ups on file.

## 2022-07-26 ENCOUNTER — Ambulatory Visit (INDEPENDENT_AMBULATORY_CARE_PROVIDER_SITE_OTHER): Payer: PPO | Admitting: Nurse Practitioner

## 2022-07-26 ENCOUNTER — Encounter: Payer: Self-pay | Admitting: Nurse Practitioner

## 2022-07-26 ENCOUNTER — Other Ambulatory Visit: Payer: Self-pay

## 2022-07-26 VITALS — BP 128/76 | HR 92 | Temp 98.1°F | Resp 16 | Ht 66.0 in | Wt 218.8 lb

## 2022-07-26 DIAGNOSIS — R350 Frequency of micturition: Secondary | ICD-10-CM

## 2022-07-26 LAB — POCT URINALYSIS DIPSTICK
Bilirubin, UA: NEGATIVE
Glucose, UA: NEGATIVE
Ketones, UA: NEGATIVE
Protein, UA: POSITIVE — AB
Spec Grav, UA: 1.02 (ref 1.010–1.025)
Urobilinogen, UA: 0.2 E.U./dL
pH, UA: 5 (ref 5.0–8.0)

## 2022-07-26 MED ORDER — CEPHALEXIN 500 MG PO CAPS: 500.0000 mg | ORAL_CAPSULE | Freq: Two times a day (BID) | ORAL | 0 refills | Status: AC

## 2022-07-28 LAB — URINE CULTURE
MICRO NUMBER:: 15151734
Result:: NO GROWTH
SPECIMEN QUALITY:: ADEQUATE

## 2022-08-04 DIAGNOSIS — Z96651 Presence of right artificial knee joint: Secondary | ICD-10-CM | POA: Diagnosis not present

## 2022-08-04 DIAGNOSIS — M1712 Unilateral primary osteoarthritis, left knee: Secondary | ICD-10-CM | POA: Diagnosis not present

## 2022-09-18 ENCOUNTER — Other Ambulatory Visit: Payer: Self-pay | Admitting: Internal Medicine

## 2022-09-18 DIAGNOSIS — M109 Gout, unspecified: Secondary | ICD-10-CM

## 2022-09-20 DIAGNOSIS — M1712 Unilateral primary osteoarthritis, left knee: Secondary | ICD-10-CM | POA: Diagnosis not present

## 2022-09-20 DIAGNOSIS — Z0189 Encounter for other specified special examinations: Secondary | ICD-10-CM | POA: Diagnosis not present

## 2022-09-20 DIAGNOSIS — Z96651 Presence of right artificial knee joint: Secondary | ICD-10-CM | POA: Diagnosis not present

## 2022-09-20 NOTE — Telephone Encounter (Signed)
Requested Prescriptions  Pending Prescriptions Disp Refills   allopurinol (ZYLOPRIM) 100 MG tablet [Pharmacy Med Name: Allopurinol 100 MG Oral Tablet] 180 tablet 0    Sig: Take 2 tablets by mouth once daily     Endocrinology:  Gout Agents - allopurinol Failed - 09/18/2022 10:40 AM      Failed - Uric Acid in normal range and within 360 days    Uric Acid, Serum  Date Value Ref Range Status  09/01/2020 5.2 2.5 - 7.0 mg/dL Final    Comment:    Therapeutic target for gout patients: <6.0 mg/dL .    Uric Acid  Date Value Ref Range Status  07/01/2015 6.9 2.5 - 7.1 mg/dL Final    Comment:               Therapeutic target for gout patients: <6.0         Failed - Cr in normal range and within 360 days    Creat  Date Value Ref Range Status  05/14/2022 1.06 (H) 0.60 - 1.00 mg/dL Final         Passed - Valid encounter within last 12 months    Recent Outpatient Visits           1 month ago Urinary frequency   [facility] Berniece Salines, FNP   4 months ago Preoperative clearance   [facility] Danelle Berry, PA-C   5 months ago Essential hypertension   [facility] Danelle Berry, PA-C   9 months ago Encounter for [employer] annual wellness exam   [facility] [place] Dense M, DO   11 months ago Essential hypertension   [facility] Danelle Berry, PA-C       Future Appointments             In 1 week Danelle Berry, PA-C [facility]            Passed - CBC within normal limits and completed in the last 12 months    WBC  Date Value Ref Range Status  03/30/2022 7.7 3.8 - 10.8 Thousand/uL Final   RBC  Date Value Ref Range Status  03/30/2022 4.28 3.80 - 5.10 Million/uL Final   Hemoglobin  Date Value Ref Range Status  03/30/2022 12.4 11.7 - 15.5 g/dL Final   HCT  Date Value Ref Range Status  03/30/2022 38.2 35.0 -  45.0 % Final   MCHC  Date Value Ref Range Status  03/30/2022 32.5 32.0 - 36.0 g/dL Final   [facility]  Date Value Ref Range Status  03/30/2022 29.0 27.0 - 33.0 pg Final   MCV  Date Value Ref Range Status  03/30/2022 89.3 80.0 - 100.0 fL Final   No results found for: "PLTCOUNTKUC", "LABPLAT", "POCPLA" RDW  Date Value Ref Range Status  03/30/2022 12.9 11.0 - 15.0 % Final

## 2022-09-30 ENCOUNTER — Encounter: Payer: Self-pay | Admitting: Family Medicine

## 2022-09-30 ENCOUNTER — Ambulatory Visit (INDEPENDENT_AMBULATORY_CARE_PROVIDER_SITE_OTHER): Payer: PPO | Admitting: Family Medicine

## 2022-09-30 VITALS — BP 138/70 | HR 82 | Temp 97.1°F | Resp 16 | Ht 66.0 in | Wt 220.4 lb

## 2022-09-30 DIAGNOSIS — I10 Essential (primary) hypertension: Secondary | ICD-10-CM | POA: Diagnosis not present

## 2022-09-30 DIAGNOSIS — K219 Gastro-esophageal reflux disease without esophagitis: Secondary | ICD-10-CM | POA: Diagnosis not present

## 2022-09-30 DIAGNOSIS — I480 Paroxysmal atrial fibrillation: Secondary | ICD-10-CM | POA: Diagnosis not present

## 2022-09-30 DIAGNOSIS — E782 Mixed hyperlipidemia: Secondary | ICD-10-CM | POA: Diagnosis not present

## 2022-09-30 DIAGNOSIS — I700 Atherosclerosis of aorta: Secondary | ICD-10-CM | POA: Diagnosis not present

## 2022-09-30 DIAGNOSIS — J449 Chronic obstructive pulmonary disease, unspecified: Secondary | ICD-10-CM

## 2022-09-30 DIAGNOSIS — R7303 Prediabetes: Secondary | ICD-10-CM

## 2022-09-30 DIAGNOSIS — Z5181 Encounter for therapeutic drug level monitoring: Secondary | ICD-10-CM | POA: Diagnosis not present

## 2022-09-30 NOTE — Progress Notes (Signed)
Name: VENOLA FELLING   MRN: [id number]    DOB: 01/31/1945   Date:09/30/2022       Progress Note  Chief Complaint  Patient presents with   Follow-up   Hypertension   Hyperlipidemia   Gastroesophageal Reflux     Subjective:   Colleen [name] is a 77 y.o. female, presents to clinic for routine f/up   HTN on hydrochlorothiazide and enalapril 2.5 mg daily BP Readings from Last 3 Encounters:  09/30/22 138/70  07/26/22 128/76  05/14/22 138/72    Hyperlipidemia: Currently treated with lovastatin, pt reports good med compliance Last Lipids: Lab Results  Component Value Date   CHOL 167 09/01/2021   HDL 60 09/01/2021   LDLCALC 88 09/01/2021   TRIG 96 09/01/2021   CHOLHDL 2.8 09/01/2021    Breztri change inhalers due to insurance coverage - she didn't like the change in inhaler and used her left over symbicort, no recent exacerbations (since May)   Pt has surgery upcoming with Dr. Odis Luster - left knee 10/17- asks about labs and preop clearance- no paperwork received      Current Outpatient Medications:    acetaminophen (TYLENOL) 650 MG CR tablet, Take 650 mg by mouth every 8 (eight) hours as needed for pain., Disp: , Rfl:    albuterol (VENTOLIN HFA) 108 (90 Base) MCG/ACT inhaler, Inhale 2 puffs into the lungs every 4 (four) hours as needed for wheezing or shortness of breath., Disp: 8 g, Rfl: 2   allopurinol (ZYLOPRIM) 100 MG tablet, Take 2 tablets by mouth once daily, Disp: 180 tablet, Rfl: 0   Budeson-Glycopyrrol-Formoterol (BREZTRI AEROSPHERE) 160-9-4.8 MCG/ACT AERO, Inhale 2 puffs into the lungs 2 (two) times daily., Disp: 3 each, Rfl: 1   cholecalciferol (VITAMIN D3) 25 MCG (1000 UNIT) tablet, Take 1,000 Units by mouth daily., Disp: , Rfl:    ELIQUIS 5 MG TABS tablet, Take 5 mg by mouth 2 (two) times daily., Disp: , Rfl:    enalapril (VASOTEC) 2.5 MG tablet, Take 1 tablet (2.5 mg total) by mouth daily., Disp: 90 tablet, Rfl: 3   famotidine (PEPCID) 20 MG tablet, Take 20  mg by mouth daily as needed for heartburn or indigestion., Disp: , Rfl:    hydrochlorothiazide (MICROZIDE) 12.5 MG capsule, Take 1 capsule (12.5 mg total) by mouth daily., Disp: 90 capsule, Rfl: 1   lovastatin (MEVACOR) 40 MG tablet, Take 1 tablet (40 mg total) by mouth at bedtime., Disp: 90 tablet, Rfl: 1   mupirocin cream (BACTROBAN) 2 %, Apply 1 application topically 2 (two) times daily., Disp: 15 g, Rfl: 1   simethicone (GAS-X) 80 MG chewable tablet, Chew 1 tablet (80 mg total) by mouth 2 (two) times a day., Disp: 60 tablet, Rfl: 2   triamcinolone cream (KENALOG) 0.1 %, APPLY TO AFFECTED AREA TWICE DAILY IF NEEDED. NO MORE THAN 1 WEEK AT A TIME, THEN TAKE A BREAK FOR 2 WEEKS., Disp: 80 g, Rfl: 0   Blood Pressure Monitoring (ADULT BLOOD PRESSURE CUFF LG) KIT, 1 each by Does not apply route daily. (Patient not taking: Reported on 07/26/2022), Disp: 1 kit, Rfl: 0  Patient Active Problem List   Diagnosis Date Noted   Acquired trigger finger of right middle finger 07/24/2021   Acquired trigger finger of right ring finger 07/24/2021   Trigger finger of right hand 06/26/2021   Hidradenitis suppurativa 11/12/2020   Status post gastroplasty-1980s 06/25/2020   CLE (columnar lined esophagus)    Hiatal hernia    Gastric  nodule    Angiodysplasia of stomach and duodenum    Cecal polyp    Polyp of descending colon    Polyp of transverse colon    Atrial fibrillation (HCC) 06/14/2019   Current use of anticoagulant therapy 06/14/2019   Iron deficiency anemia 06/14/2019   Essential (hemorrhagic) thrombocythemia (HCC) 06/14/2019   GERD (gastroesophageal reflux disease) 04/15/2019   Chronic low back pain 04/15/2019   Right pulmonary embolus (HCC) 04/15/2019   DOE (dyspnea on exertion) 03/13/2019   Atherosclerosis of both carotid arteries 09/20/2018   Spinal stenosis of lumbar region with neurogenic claudication 09/20/2018   Aortic atherosclerosis (HCC) 09/05/2018   At high risk for falls 09/05/2018    Morbid obesity with BMI of 45.0-49.9, adult (HCC) 08/29/2017   Osteopenia 05/18/2017   Stage 3 chronic kidney disease (HCC) 04/21/2017   Morbid obesity (HCC) 04/21/2017   Trigger middle finger of left hand 02/28/2017   Chronic gouty arthropathy without tophi 11/29/2016   Hypergammaglobulinemia 11/29/2016   Osteoarthritis of both knees 11/29/2016   Bilateral primary osteoarthritis of knee 11/29/2016   Allergic rhinitis, seasonal 07/30/2014   Gout 07/30/2014   HLD (hyperlipidemia) 07/30/2014   Osteoarthritis of knee 07/30/2014   Prediabetes 07/30/2014   Asthma, mild persistent 07/30/2014   COPD (chronic obstructive pulmonary disease) (HCC) 07/09/2008   Essential hypertension 06/22/2006    Past Surgical History:  Procedure Laterality Date   BACK SURGERY     BREAST BIOPSY Left 04/08/2014   intraductal papilloma 5 mm   COLONOSCOPY WITH PROPOFOL N/A 04/23/2020   Procedure: COLONOSCOPY WITH PROPOFOL;  Surgeon: Pasty Spillers, MD;  Location: [facility] ENDOSCOPY;  Service: Endoscopy;  Laterality: N/A;   ESOPHAGOGASTRODUODENOSCOPY (EGD) WITH PROPOFOL N/A 04/23/2020   Procedure: ESOPHAGOGASTRODUODENOSCOPY (EGD) WITH PROPOFOL;  Surgeon: Pasty Spillers, MD;  Location: [facility] ENDOSCOPY;  Service: Endoscopy;  Laterality: N/A;   GIVENS CAPSULE STUDY N/A 01/28/2022   Procedure: GIVENS CAPSULE STUDY;  Surgeon: Wyline Mood, MD;  Location: [facility] ENDOSCOPY;  Service: Gastroenterology;  Laterality: N/A;   LUMBAR LAMINECTOMY     TONSILLECTOMY      Family History  Problem Relation Age of Onset   Diabetes Mother    Lung cancer Father    Diabetes Sister    Congestive Heart Failure Sister    Glaucoma Sister    Breast cancer Maternal Aunt     Social History   Tobacco Use   Smoking status: Former    Current packs/day: 0.00    Average packs/day: 1.5 packs/day for 20.0 years (30.0 ttl pk-yrs)    Types: Cigarettes    Start date: 11/13/1988    Quit date: 11/13/2008    Years since quitting: 13.8    Smokeless tobacco: Never   Tobacco comments:    smoking cessation materials not required  Vaping Use   Vaping status: Never Used  Substance Use Topics   Alcohol use: Not Currently   Drug use: No     No Known Allergies  Health Maintenance  Topic Date Due   DTaP/Tdap/Td (2 - Td or Tdap) 10/12/2021   INFLUENZA VACCINE  08/26/2022   COVID-19 Vaccine (5 - 2023-24 season) 10/16/2022 (Originally 09/26/2022)   Lung Cancer Screening  12/03/2022 (Originally 04/08/1996)   DEXA SCAN  03/30/2023 (Originally 05/19/2019)   MAMMOGRAM  10/09/2022   Medicare Annual Wellness (AWV)  12/03/2022   Colonoscopy  04/23/2025   Pneumonia Vaccine 45+ Years old  Completed   Hepatitis C Screening  Completed   HPV VACCINES  Aged Out  Fecal DNA (Cologuard)  Discontinued   Zoster Vaccines- Shingrix  Discontinued    Chart Review Today: I personally reviewed active problem list, medication list, allergies, family history, social history, health maintenance, notes from last encounter, lab results, imaging with the patient/caregiver today.   Review of Systems  Constitutional: Negative.   HENT: Negative.    Eyes: Negative.   Respiratory: Negative.    Cardiovascular: Negative.   Gastrointestinal: Negative.   Endocrine: Negative.   Genitourinary: Negative.   Musculoskeletal: Negative.   Skin: Negative.   Allergic/Immunologic: Negative.   Neurological: Negative.   Hematological: Negative.   Psychiatric/Behavioral: Negative.    All other systems reviewed and are negative.    Objective:   Vitals:   09/30/22 0902  BP: 138/70  Pulse: 82  Resp: 16  Temp: (!) 97.1 F (36.2 C)  TempSrc: Oral  SpO2: 98%  Weight: 220 lb 6.4 oz (100 kg)  Height: 5\' 6"  (1.676 m)    Body mass index is 35.57 kg/m.  Physical Exam Vitals and nursing note reviewed.  Constitutional:      Appearance: She is well-developed.  HENT:     Head: Normocephalic and atraumatic.     Nose: Nose normal.  Eyes:     General:         Right eye: No discharge.        Left eye: No discharge.     Conjunctiva/sclera: Conjunctivae normal.  Neck:     Trachea: No tracheal deviation.  Cardiovascular:     Rate and Rhythm: Normal rate and regular rhythm.  Pulmonary:     Effort: Pulmonary effort is normal. No respiratory distress.     Breath sounds: Normal breath sounds. No stridor.  Skin:    General: Skin is warm and dry.     Findings: No bruising, lesion or rash.  Neurological:     Mental Status: She is alert.     Motor: No abnormal muscle tone.     Coordination: Coordination normal.     Gait: Gait abnormal.  Psychiatric:        Behavior: Behavior normal.         Assessment & Plan:   Problem List Items Addressed This Visit       Cardiovascular and Mediastinum   Essential hypertension - Primary    Compliant with meds, BP slightly above goal today, she hasn't taken meds yet today BP Readings from Last 3 Encounters:  09/30/22 138/70  07/26/22 128/76  05/14/22 138/72  Continue enalapril and hydrochlorothiazide        Relevant Orders   COMPLETE METABOLIC PANEL WITH GFR (Completed)   Aortic atherosclerosis (HCC)   Atrial fibrillation (HCC)    Paroxymal afib onset with hospitalization, she is following with cardiology On eliquis (also hx of PE) She is not having palpitations, previously intolerant of BB Good cardiology follow up        Respiratory   COPD (chronic obstructive pulmonary disease) (HCC) (Chronic)    Pt reports sx are well controlled, she is using old symbicort inhalers, she does not like breztri change per insurance      Relevant Orders   CBC with Differential/Platelet (Completed)     Digestive   GERD (gastroesophageal reflux disease)    She reports sx are well controlled        Other   Prediabetes (Chronic)    Labs done about 6 months ago Recheck A1C, not on meds       Relevant Orders  COMPLETE METABOLIC PANEL WITH GFR (Completed)   Hemoglobin A1c (Completed)   HLD  (hyperlipidemia)    Compliant with statin, no SE or concerns, due for annual lipids      Relevant Orders   COMPLETE METABOLIC PANEL WITH GFR (Completed)   Lipid panel (Completed)   Other Visit Diagnoses     Encounter for medication monitoring       Relevant Orders   COMPLETE METABOLIC PANEL WITH GFR (Completed)   Hemoglobin A1c (Completed)   Lipid panel (Completed)   CBC with Differential/Platelet (Completed)        Return in about 6 months (around 03/30/2023) for Routine follow-up.   Danelle Berry, PA-C 09/30/22 9:29 AM

## 2022-10-01 LAB — CBC WITH DIFFERENTIAL/PLATELET
Absolute Monocytes: 621 {cells}/uL (ref 200–950)
Basophils Absolute: 90 {cells}/uL (ref 0–200)
Basophils Relative: 1.3 %
Eosinophils Absolute: 90 {cells}/uL (ref 15–500)
Eosinophils Relative: 1.3 %
HCT: 30.3 % — ABNORMAL LOW (ref 35.0–45.0)
Hemoglobin: 9.3 g/dL — ABNORMAL LOW (ref 11.7–15.5)
Lymphs Abs: 2374 {cells}/uL (ref 850–3900)
MCH: 25.5 pg — ABNORMAL LOW (ref 27.0–33.0)
MCHC: 30.7 g/dL — ABNORMAL LOW (ref 32.0–36.0)
MCV: 83.2 fL (ref 80.0–100.0)
MPV: 9.5 fL (ref 7.5–12.5)
Monocytes Relative: 9 %
Neutro Abs: 3726 {cells}/uL (ref 1500–7800)
Neutrophils Relative %: 54 %
Platelets: 465 10*3/uL — ABNORMAL HIGH (ref 140–400)
RBC: 3.64 10*6/uL — ABNORMAL LOW (ref 3.80–5.10)
RDW: 14.1 % (ref 11.0–15.0)
Total Lymphocyte: 34.4 %
WBC: 6.9 10*3/uL (ref 3.8–10.8)

## 2022-10-01 LAB — LIPID PANEL
Cholesterol: 155 mg/dL (ref <200)
HDL: 69 mg/dL (ref >=50)
LDL Cholesterol (Calc): 71 mg/dL
Non-HDL Cholesterol (Calc): 86 mg/dL (ref <130)
Total CHOL/HDL Ratio: 2.2 (calc) (ref <5.0)
Triglycerides: 70 mg/dL (ref <150)

## 2022-10-01 LAB — COMPLETE METABOLIC PANEL WITH GFR
AG Ratio: 1.1 (calc) (ref 1.0–2.5)
ALT: 11 U/L (ref 6–29)
AST: 17 U/L (ref 10–35)
Albumin: 3.7 g/dL (ref 3.6–5.1)
Alkaline phosphatase (APISO): 104 U/L (ref 37–153)
BUN: 21 mg/dL (ref 7–25)
CO2: 27 mmol/L (ref 20–32)
Calcium: 9.2 mg/dL (ref 8.6–10.4)
Chloride: 101 mmol/L (ref 98–110)
Creat: 0.85 mg/dL (ref 0.60–1.00)
Globulin: 3.5 g/dL (ref 1.9–3.7)
Glucose, Bld: 123 mg/dL — ABNORMAL HIGH (ref 65–99)
Potassium: 4.4 mmol/L (ref 3.5–5.3)
Sodium: 136 mmol/L (ref 135–146)
Total Bilirubin: 0.3 mg/dL (ref 0.2–1.2)
Total Protein: 7.2 g/dL (ref 6.1–8.1)
eGFR: 71 mL/min/{1.73_m2} (ref >=60)

## 2022-10-01 LAB — HEMOGLOBIN A1C
Hgb A1c MFr Bld: 6.4 %{Hb} — ABNORMAL HIGH (ref <5.7)
Mean Plasma Glucose: 137 mg/dL
eAG (mmol/L): 7.6 mmol/L

## 2022-10-04 ENCOUNTER — Encounter: Payer: Self-pay | Admitting: Pharmacist

## 2022-10-04 NOTE — Progress Notes (Signed)
   10/04/2022  Patient ID: Colleen [name], female   DOB: 05/23/1945, 77 y.o.   MRN: [id number]  Pharmacy Quality Measure Review  This patient is appearing on a report for the adherence measure for cholesterol (statin) medications this calendar year.   Medication: lovastatin 40 mg Last fill date: 09/07/2022 for 90 day supply  [employer] report was not up to date. No action needed at this time.   Estelle Grumbles, PharmD, [facility] [phone]

## 2022-10-05 ENCOUNTER — Other Ambulatory Visit: Payer: Self-pay | Admitting: Family Medicine

## 2022-10-05 DIAGNOSIS — I700 Atherosclerosis of aorta: Secondary | ICD-10-CM

## 2022-10-05 DIAGNOSIS — I10 Essential (primary) hypertension: Secondary | ICD-10-CM

## 2022-10-05 DIAGNOSIS — E782 Mixed hyperlipidemia: Secondary | ICD-10-CM

## 2022-10-05 DIAGNOSIS — M109 Gout, unspecified: Secondary | ICD-10-CM

## 2022-10-05 MED ORDER — ENALAPRIL MALEATE 2.5 MG PO TABS: 2.5000 mg | ORAL_TABLET | Freq: Every day | ORAL | 3 refills | Status: DC

## 2022-10-05 MED ORDER — ALLOPURINOL 100 MG PO TABS: 200.0000 mg | ORAL_TABLET | Freq: Every day | ORAL | 0 refills | Status: DC

## 2022-10-05 MED ORDER — HYDROCHLOROTHIAZIDE 12.5 MG PO CAPS: 12.5000 mg | ORAL_CAPSULE | Freq: Every day | ORAL | 1 refills | Status: DC

## 2022-10-05 MED ORDER — LOVASTATIN 40 MG PO TABS: 40.0000 mg | ORAL_TABLET | Freq: Every day | ORAL | 1 refills | Status: DC

## 2022-10-11 NOTE — Assessment & Plan Note (Signed)
Labs done about 6 months ago Recheck A1C, not on meds

## 2022-10-11 NOTE — Assessment & Plan Note (Signed)
Compliant with meds, BP slightly above goal today, she hasn't taken meds yet today BP Readings from Last 3 Encounters:  09/30/22 138/70  07/26/22 128/76  05/14/22 138/72  Continue enalapril and hydrochlorothiazide

## 2022-10-11 NOTE — Assessment & Plan Note (Signed)
Paroxymal afib onset with hospitalization, she is following with cardiology On eliquis (also hx of PE) She is not having palpitations, previously intolerant of BB Good cardiology follow up

## 2022-10-11 NOTE — Assessment & Plan Note (Signed)
Pt reports sx are well controlled, she is using old symbicort inhalers, she does not like breztri change per insurance

## 2022-10-11 NOTE — Assessment & Plan Note (Signed)
She reports sx are well controlled

## 2022-10-11 NOTE — Assessment & Plan Note (Signed)
Compliant with statin, no SE or concerns, due for annual lipids

## 2022-11-02 DIAGNOSIS — D500 Iron deficiency anemia secondary to blood loss (chronic): Secondary | ICD-10-CM | POA: Diagnosis not present

## 2022-11-02 DIAGNOSIS — K5520 Angiodysplasia of colon without hemorrhage: Secondary | ICD-10-CM | POA: Diagnosis not present

## 2022-11-02 DIAGNOSIS — I2699 Other pulmonary embolism without acute cor pulmonale: Secondary | ICD-10-CM | POA: Diagnosis not present

## 2022-11-05 DIAGNOSIS — D500 Iron deficiency anemia secondary to blood loss (chronic): Secondary | ICD-10-CM | POA: Diagnosis not present

## 2022-11-05 DIAGNOSIS — Z79899 Other long term (current) drug therapy: Secondary | ICD-10-CM | POA: Diagnosis not present

## 2022-11-07 DIAGNOSIS — H1132 Conjunctival hemorrhage, left eye: Secondary | ICD-10-CM | POA: Diagnosis not present

## 2022-11-09 ENCOUNTER — Telehealth: Payer: Self-pay

## 2022-11-09 NOTE — Telephone Encounter (Signed)
Pt called to request labs to be re done due to her iron levels were low. Pt verbalized she had blood transfusion and per surgery department they need more update labs of how her iron levels are now. I advised to reach out to Hematology department to see if they can order it since they just recently seen her and checked her iron levels. Pt verbalized she has upcoming surgery for her knee .

## 2022-11-11 DIAGNOSIS — Z886 Allergy status to analgesic agent status: Secondary | ICD-10-CM | POA: Diagnosis not present

## 2022-11-11 DIAGNOSIS — I480 Paroxysmal atrial fibrillation: Secondary | ICD-10-CM | POA: Diagnosis not present

## 2022-11-11 DIAGNOSIS — G8918 Other acute postprocedural pain: Secondary | ICD-10-CM | POA: Diagnosis not present

## 2022-11-11 DIAGNOSIS — I129 Hypertensive chronic kidney disease with stage 1 through stage 4 chronic kidney disease, or unspecified chronic kidney disease: Secondary | ICD-10-CM | POA: Diagnosis not present

## 2022-11-11 DIAGNOSIS — D6489 Other specified anemias: Secondary | ICD-10-CM | POA: Diagnosis not present

## 2022-11-11 DIAGNOSIS — R7303 Prediabetes: Secondary | ICD-10-CM | POA: Diagnosis not present

## 2022-11-11 DIAGNOSIS — D649 Anemia, unspecified: Secondary | ICD-10-CM | POA: Diagnosis not present

## 2022-11-11 DIAGNOSIS — Z96651 Presence of right artificial knee joint: Secondary | ICD-10-CM | POA: Diagnosis not present

## 2022-11-11 DIAGNOSIS — Z79899 Other long term (current) drug therapy: Secondary | ICD-10-CM | POA: Diagnosis not present

## 2022-11-11 DIAGNOSIS — I10 Essential (primary) hypertension: Secondary | ICD-10-CM | POA: Diagnosis not present

## 2022-11-11 DIAGNOSIS — Z7902 Long term (current) use of antithrombotics/antiplatelets: Secondary | ICD-10-CM | POA: Diagnosis not present

## 2022-11-11 DIAGNOSIS — N183 Chronic kidney disease, stage 3 unspecified: Secondary | ICD-10-CM | POA: Diagnosis not present

## 2022-11-11 DIAGNOSIS — J4489 Other specified chronic obstructive pulmonary disease: Secondary | ICD-10-CM | POA: Diagnosis not present

## 2022-11-11 DIAGNOSIS — Z87440 Personal history of urinary (tract) infections: Secondary | ICD-10-CM | POA: Diagnosis not present

## 2022-11-11 DIAGNOSIS — Z9884 Bariatric surgery status: Secondary | ICD-10-CM | POA: Diagnosis not present

## 2022-11-11 DIAGNOSIS — J449 Chronic obstructive pulmonary disease, unspecified: Secondary | ICD-10-CM | POA: Diagnosis not present

## 2022-11-11 DIAGNOSIS — N189 Chronic kidney disease, unspecified: Secondary | ICD-10-CM | POA: Diagnosis not present

## 2022-11-11 DIAGNOSIS — M1712 Unilateral primary osteoarthritis, left knee: Secondary | ICD-10-CM | POA: Diagnosis not present

## 2022-11-11 DIAGNOSIS — Z9889 Other specified postprocedural states: Secondary | ICD-10-CM | POA: Diagnosis not present

## 2022-11-11 DIAGNOSIS — Z7901 Long term (current) use of anticoagulants: Secondary | ICD-10-CM | POA: Diagnosis not present

## 2022-11-11 DIAGNOSIS — Z86711 Personal history of pulmonary embolism: Secondary | ICD-10-CM | POA: Diagnosis not present

## 2022-11-11 DIAGNOSIS — D509 Iron deficiency anemia, unspecified: Secondary | ICD-10-CM | POA: Diagnosis not present

## 2022-11-11 DIAGNOSIS — E785 Hyperlipidemia, unspecified: Secondary | ICD-10-CM | POA: Diagnosis not present

## 2022-11-11 DIAGNOSIS — Z9009 Acquired absence of other part of head and neck: Secondary | ICD-10-CM | POA: Diagnosis not present

## 2022-11-11 DIAGNOSIS — Z8619 Personal history of other infectious and parasitic diseases: Secondary | ICD-10-CM | POA: Diagnosis not present

## 2022-11-11 DIAGNOSIS — M109 Gout, unspecified: Secondary | ICD-10-CM | POA: Diagnosis not present

## 2022-11-11 DIAGNOSIS — K5900 Constipation, unspecified: Secondary | ICD-10-CM | POA: Diagnosis not present

## 2022-11-11 DIAGNOSIS — K219 Gastro-esophageal reflux disease without esophagitis: Secondary | ICD-10-CM | POA: Diagnosis not present

## 2022-11-18 DIAGNOSIS — J449 Chronic obstructive pulmonary disease, unspecified: Secondary | ICD-10-CM | POA: Diagnosis not present

## 2022-11-18 DIAGNOSIS — D509 Iron deficiency anemia, unspecified: Secondary | ICD-10-CM | POA: Diagnosis not present

## 2022-11-18 DIAGNOSIS — N183 Chronic kidney disease, stage 3 unspecified: Secondary | ICD-10-CM | POA: Diagnosis not present

## 2022-11-18 DIAGNOSIS — I1310 Hypertensive heart and chronic kidney disease without heart failure, with stage 1 through stage 4 chronic kidney disease, or unspecified chronic kidney disease: Secondary | ICD-10-CM | POA: Diagnosis not present

## 2022-11-18 DIAGNOSIS — M109 Gout, unspecified: Secondary | ICD-10-CM | POA: Diagnosis not present

## 2022-11-18 DIAGNOSIS — E785 Hyperlipidemia, unspecified: Secondary | ICD-10-CM | POA: Diagnosis not present

## 2022-11-18 DIAGNOSIS — I480 Paroxysmal atrial fibrillation: Secondary | ICD-10-CM | POA: Diagnosis not present

## 2022-11-18 DIAGNOSIS — Z96652 Presence of left artificial knee joint: Secondary | ICD-10-CM | POA: Diagnosis not present

## 2022-11-18 DIAGNOSIS — Z471 Aftercare following joint replacement surgery: Secondary | ICD-10-CM | POA: Diagnosis not present

## 2022-11-18 DIAGNOSIS — Z7901 Long term (current) use of anticoagulants: Secondary | ICD-10-CM | POA: Diagnosis not present

## 2022-11-19 ENCOUNTER — Ambulatory Visit: Payer: Self-pay

## 2022-11-19 NOTE — Telephone Encounter (Signed)
Pt Niece was notified and was going to let her know

## 2022-11-19 NOTE — Telephone Encounter (Signed)
    Chief Complaint: PT, Irving Burton with [facility] is with pt. Pt. Felt dizzy today. Has been having lower BP since she was hospitalized for knee replacement 11/11/22. BP today 112/64, 106/62. 100/62. Not dizzy currently. Only taking hydrochloro thiazide presently for BP. Has to talk to her niece about when she can bring her in for an appointment. Will call back Symptoms: Had dizziness this morning. Frequency: 2 weeks Pertinent Negatives: Patient denies any other symptom. Disposition: [] ED /[] Urgent Care (no appt availability in office) / [] Appointment(In office/virtual)/ []  [place] [facility]/ [] Home Care/ [] Refused Recommended Disposition /[] [place] [facility]/ [x]  Follow-up with PCP Additional Notes: Pt. Will go to ED for worsening of symptoms.  Reason for Disposition  Brief (now gone) dizziness or lightheadedness after standing up or eating  Answer Assessment - Initial Assessment Questions 1. BLOOD PRESSURE: "What is the blood pressure?" "Did you take at least two measurements 5 minutes apart?"     112/64  106/62  100/62 2. ONSET: "When did you take your blood pressure?"     In the hospital 3. HOW: "How did you obtain the blood pressure?" (e.g., visiting nurse, automatic home BP monitor)     PT 4. HISTORY: "Do you have a history of low blood pressure?" "What is your blood pressure normally?"     No 5. MEDICINES: "Are you taking any medications for blood pressure?" If Yes, ask: "Have they been changed recently?"     Yes 6. PULSE RATE: "Do you know what your pulse rate is?"      Normal 7. OTHER SYMPTOMS: "Have you been sick recently?" "Have you had a recent injury?"     Left replacement 8. PREGNANCY: "Is there any chance you are pregnant?" "When was your last menstrual period?"     No  Protocols used: Blood Pressure - Low-A-AH

## 2022-11-23 DIAGNOSIS — M1712 Unilateral primary osteoarthritis, left knee: Secondary | ICD-10-CM | POA: Diagnosis not present

## 2022-12-02 ENCOUNTER — Ambulatory Visit: Payer: PPO

## 2022-12-02 DIAGNOSIS — Z0000 Encounter for general adult medical examination without abnormal findings: Secondary | ICD-10-CM | POA: Diagnosis not present

## 2022-12-02 NOTE — Patient Instructions (Addendum)
Colleen [name] , Thank you for taking time to come for your Medicare Wellness Visit. I appreciate your ongoing commitment to your health goals. Please review the following plan we discussed and let me know if I can assist you in the future.   Referrals/Orders/Follow-Ups/Clinician Recommendations: none  This is a list of the screening recommended for you and due dates:  Health Maintenance  Topic Date Due   DTaP/Tdap/Td vaccine (2 - Td or Tdap) 10/12/2021   COVID-19 Vaccine (5 - 2023-24 season) 09/26/2022   Mammogram  10/09/2022   Screening for Lung Cancer  12/03/2022*   DEXA scan (bone density measurement)  03/30/2023*   Medicare Annual Wellness Visit  12/02/2023   Colon Cancer Screening  04/23/2025   Pneumonia Vaccine  Completed   Flu Shot  Completed   Hepatitis C Screening  Completed   HPV Vaccine  Aged Out   Cologuard (Stool DNA test)  Discontinued   Zoster (Shingles) Vaccine  Discontinued  *Topic was postponed. The date shown is not the original due date.    Advanced directives: (ACP Link)Information on Advanced Care Planning can be found at [facility] Directives Advance Health Care Directives ([email or web address]   Next Medicare Annual Wellness Visit scheduled for next year: Yes   12/08/23 @ 9:35 am in person

## 2022-12-02 NOTE — Progress Notes (Cosign Needed)
Subjective:   Colleen [name] is a 77 y.o. female who presents for Medicare Annual (Subsequent) preventive examination.  Visit Complete: Virtual I connected with  [name] on 12/02/22 by a audio enabled telemedicine application and verified that I am speaking with the correct person using two identifiers.  Patient Location: Home  Provider Location: Office/Clinic  I discussed the limitations of evaluation and management by telemedicine. The patient expressed understanding and agreed to proceed.  Vital Signs: Because this visit was a virtual/telehealth visit, some criteria may be missing or patient reported. Any vitals not documented were not able to be obtained and vitals that have been documented are patient reported.Cardiac Risk Factors include: advanced age (>37men, >41 women);obesity (BMI >30kg/m2);dyslipidemia;hypertension;sedentary lifestyle     Objective:    Today's Vitals   12/02/22 0929  PainSc: 4    There is no height or weight on file to calculate BMI.     12/02/2022    9:39 AM 12/02/2020   11:35 AM 12/02/2020   10:58 AM 04/23/2020    7:51 AM 11/29/2019   10:49 AM 02/11/2019    1:40 AM 02/10/2019    2:23 PM  Advanced Directives  Does Patient Have a Medical Advance Directive? No Yes Yes Yes Yes Yes Yes  Type of Air cabin crew of [place];Living will Healthcare Power of [place];Living will  Healthcare Power of [place];Living will  Does patient want to make changes to medical advance directive?      No - Patient declined No - Patient declined  Copy of Healthcare Power of Attorney in Chart?    No - copy requested No - copy requested No - copy requested No - copy requested  Would patient like information on creating a medical advance directive? No - Patient declined          Current Medications (verified) Outpatient Encounter Medications as of 12/02/2022  Medication Sig   acetaminophen (TYLENOL) 650 MG CR tablet Take  650 mg by mouth every 8 (eight) hours as needed for pain.   albuterol (VENTOLIN HFA) 108 (90 Base) MCG/ACT inhaler Inhale 2 puffs into the lungs every 4 (four) hours as needed for wheezing or shortness of breath.   allopurinol (ZYLOPRIM) 100 MG tablet Take 2 tablets (200 mg total) by mouth daily.   Blood Pressure Monitoring (ADULT BLOOD PRESSURE CUFF LG) KIT 1 each by Does not apply route daily.   Budeson-Glycopyrrol-Formoterol (BREZTRI AEROSPHERE) 160-9-4.8 MCG/ACT AERO Inhale 2 puffs into the lungs 2 (two) times daily.   cholecalciferol (VITAMIN D3) 25 MCG (1000 UNIT) tablet Take 1,000 Units by mouth daily.   ELIQUIS 5 MG TABS tablet Take 5 mg by mouth 2 (two) times daily.   famotidine (PEPCID) 20 MG tablet Take 20 mg by mouth daily as needed for heartburn or indigestion.   hydrochlorothiazide (MICROZIDE) 12.5 MG capsule Take 1 capsule (12.5 mg total) by mouth daily.   lovastatin (MEVACOR) 40 MG tablet Take 1 tablet (40 mg total) by mouth at bedtime.   mupirocin cream (BACTROBAN) 2 % Apply 1 application topically 2 (two) times daily.   simethicone (GAS-X) 80 MG chewable tablet Chew 1 tablet (80 mg total) by mouth 2 (two) times a day.   triamcinolone cream (KENALOG) 0.1 % APPLY TO AFFECTED AREA TWICE DAILY IF NEEDED. NO MORE THAN 1 WEEK AT A TIME, THEN TAKE A BREAK FOR 2 WEEKS.   enalapril (VASOTEC) 2.5 MG tablet Take 1 tablet (2.5 mg total)  by mouth daily. (Patient not taking: Reported on 12/02/2022)   No facility-administered encounter medications on file as of 12/02/2022.    Allergies (verified) Nsaids   History: Past Medical History:  Diagnosis Date   Asthma    Cataract    COPD (chronic obstructive pulmonary disease) (HCC)    GERD (gastroesophageal reflux disease)    Gout    History of pulmonary embolus (PE)    Hyperlipidemia    Hypertension    Osteoarthrosis    Prediabetes    Past Surgical History:  Procedure Laterality Date   BACK SURGERY     BREAST BIOPSY Left 04/08/2014    intraductal papilloma 5 mm   COLONOSCOPY WITH PROPOFOL N/A 04/23/2020   Procedure: COLONOSCOPY WITH PROPOFOL;  Surgeon: Pasty Spillers, MD;  Location: [facility] ENDOSCOPY;  Service: Endoscopy;  Laterality: N/A;   ESOPHAGOGASTRODUODENOSCOPY (EGD) WITH PROPOFOL N/A 04/23/2020   Procedure: ESOPHAGOGASTRODUODENOSCOPY (EGD) WITH PROPOFOL;  Surgeon: Pasty Spillers, MD;  Location: [facility] ENDOSCOPY;  Service: Endoscopy;  Laterality: N/A;   GIVENS CAPSULE STUDY N/A 01/28/2022   Procedure: GIVENS CAPSULE STUDY;  Surgeon: Wyline Mood, MD;  Location: [facility] ENDOSCOPY;  Service: Gastroenterology;  Laterality: N/A;   LUMBAR LAMINECTOMY     TONSILLECTOMY     Family History  Problem Relation Age of Onset   Diabetes Mother    Lung cancer Father    Diabetes Sister    Congestive Heart Failure Sister    Glaucoma Sister    Breast cancer Maternal Aunt    Social History   Socioeconomic History   Marital status: Widowed    Spouse name: Etheleen Nicks   Number of children: 0   Years of education: Not on file   Highest education level: 12th grade  Occupational History   Occupation: Retired  Tobacco Use   Smoking status: Former    Current packs/day: 0.00    Average packs/day: 1.5 packs/day for 20.0 years (30.0 ttl pk-yrs)    Types: Cigarettes    Start date: 11/13/1988    Quit date: 11/13/2008    Years since quitting: 14.0   Smokeless tobacco: Never   Tobacco comments:    smoking cessation materials not required  Vaping Use   Vaping status: Never Used  Substance and Sexual Activity   Alcohol use: Not Currently   Drug use: No   Sexual activity: Not Currently  Other Topics Concern   Not on file  Social History Narrative   Pt lives alone   Social Determinants of Health   Financial Resource Strain: Low Risk  (12/02/2022)   Overall Financial Resource Strain (CARDIA)    Difficulty of Paying Living Expenses: Not very hard  Food Insecurity: No Food Insecurity (12/02/2022)   Hunger Vital Sign     Worried About Running Out of Food in the Last Year: Never true    Ran Out of Food in the Last Year: Never true  Transportation Needs: No Transportation Needs (12/02/2022)   PRAPARE - Administrator, Civil Service (Medical): No    Lack of Transportation (Non-Medical): No  Physical Activity: Insufficiently Active (12/02/2022)   Exercise Vital Sign    Days of Exercise per Week: 2 days    Minutes of Exercise per Session: 60 min  Stress: No Stress Concern Present (12/02/2022)   [employer] of Occupational Health - Occupational Stress Questionnaire    Feeling of Stress : Not at all  Social Connections: Moderately Isolated (12/02/2022)   Social Connection and Isolation Panel [NHANES]  Frequency of Communication with Friends and Family: More than three times a week    Frequency of Social Gatherings with Friends and Family: Twice a week    Attends Religious Services: More than 4 times per year    Active Member of [employer] or Organizations: No    Attends Banker Meetings: Never    Marital Status: Widowed    Tobacco Counseling Counseling given: Not Answered Tobacco comments: smoking cessation materials not required   Clinical Intake:  Pre-visit preparation completed: Yes  Pain : 0-10 Pain Score: 4  Pain Type: Chronic pain Pain Location: Leg Pain Orientation: Left Pain Descriptors / Indicators: Aching, Constant Pain Onset: More than a month ago Pain Frequency: Constant Effect of Pain on Daily Activities: had knee replacement surgery     BMI - recorded: 35.5 Nutritional Status: BMI > 30  Obese Nutritional Risks: None Diabetes: No  How often do you need to have someone help you when you read instructions, pamphlets, or other written materials from your doctor or pharmacy?: 1 - Never  Interpreter Needed?: No  Information entered by :: Kennedy Bucker, LPN   Activities of Daily Living    12/02/2022    9:43 AM 09/30/2022    9:01 AM  In your present  state of health, do you have any difficulty performing the following activities:  Hearing? 0 0  Vision? 0 0  Difficulty concentrating or making decisions? 0 0  Walking or climbing stairs? 0 0  Dressing or bathing? 0 0  Doing errands, shopping? 0 0  Preparing Food and eating ? N   Using the Toilet? N   In the past six months, have you accidently leaked urine? N   Do you have problems with loss of bowel control? N   Managing your Medications? N   Managing your Finances? N   Housekeeping or managing your Housekeeping? N     Patient Care Team: Danelle Berry, PA-C as PCP - General (Family Medicine) Debbe Odea, MD as PCP - Cardiology (Cardiology) Evalina Field, MD as Consulting Physician (Rheumatology) Oscar La, RN (Inactive) as Registered Nurse Laverta Baltimore, MD as Referring Physician (Oncology) Wyline Mood, MD as Consulting Physician (Gastroenterology)  Indicate any recent Medical Services you may have received from other than [facility] providers in the past year (date may be approximate).     Assessment:   This is a routine wellness examination for Aysia.  Hearing/Vision screen Hearing Screening - Comments:: No aids Vision Screening - Comments:: Readers- [facility]   Goals Addressed             This Visit's Progress    DIET - EAT MORE FRUITS AND VEGETABLES         Depression Screen    12/02/2022    9:36 AM 09/30/2022    9:02 AM 05/14/2022   10:29 AM 03/30/2022    8:37 AM 12/02/2021    9:50 AM 09/29/2021   10:11 AM 09/01/2021    8:05 AM  PHQ 2/9 Scores  PHQ - 2 Score 0 0 0 0 0 0 0  PHQ- 9 Score 0 0 0 0 0 0 0    Fall Risk    12/02/2022    9:42 AM 09/30/2022    9:01 AM 05/14/2022   10:29 AM 03/30/2022    8:37 AM 12/02/2021    9:49 AM  Fall Risk   Falls in the past year? 1 0 0 0 1  Number falls in past yr: 0  0 0 0 0  Injury with Fall? 1 0 0 0 0  Risk for fall due to : History of fall(s) No Fall Risks No Fall Risks No Fall Risks Impaired balance/gait   Follow up Falls evaluation completed;Falls prevention discussed Falls prevention discussed;Education provided;Falls evaluation completed Falls prevention discussed;Education provided;Falls evaluation completed Falls prevention discussed;Education provided;Falls evaluation completed Falls prevention discussed;Education provided;Falls evaluation completed    MEDICARE RISK AT HOME: Medicare Risk at Home Any stairs in or around the home?: Yes If so, are there any without handrails?: No Home free of loose throw rugs in walkways, pet beds, electrical cords, etc?: Yes Adequate lighting in your home to reduce risk of falls?: Yes Life alert?: No Use of a cane, walker or w/c?: Yes (walker at home, cane when out) Grab bars in the bathroom?: No Shower chair or bench in shower?: Yes Elevated toilet seat or a handicapped toilet?: Yes  TIMED UP AND GO:  Was the test performed?  No    Cognitive Function:        12/02/2022    9:45 AM 12/02/2021    9:50 AM 11/29/2019   10:53 AM 09/05/2018    8:30 AM 09/01/2017    8:51 AM  6CIT Screen  What Year? 0 points 0 points 0 points 0 points 0 points  What month? 0 points 0 points 0 points 0 points 0 points  What time? 0 points 0 points 0 points 0 points 0 points  Count back from 20 2 points 4 points 0 points 0 points 4 points  Months in reverse 0 points 0 points 0 points 0 points 0 points  Repeat phrase 0 points 0 points 2 points 2 points 6 points  Total Score 2 points 4 points 2 points 2 points 10 points    Immunizations Immunization History  Administered Date(s) Administered   Fluad Quad(high Dose 65+) 10/26/2018, 10/16/2019   Influenza Split 10/28/2022   Influenza, High Dose Seasonal PF 10/04/2016, 10/12/2017   Influenza-Unspecified 10/26/2014, 09/27/2015, 10/26/2018, 10/18/2021   Moderna Covid-19 Vaccine Bivalent Booster 20yrs & up 10/26/2021   Moderna Sars-Covid-2 Vaccination 11/21/2019   PFIZER(Purple Top)SARS-COV-2 Vaccination 02/01/2019,  02/22/2019   PNEUMOCOCCAL CONJUGATE-20 03/03/2021   Pneumococcal Conjugate-13 02/21/2013   Pneumococcal Polysaccharide-23 10/13/2011   Tdap 10/13/2011    TDAP status: Due, Education has been provided regarding the importance of this vaccine. Advised may receive this vaccine at local pharmacy or Health Dept. Aware to provide a copy of the vaccination record if obtained from local pharmacy or Health Dept. Verbalized acceptance and understanding.  Flu Vaccine status: Up to date  Pneumococcal vaccine status: Up to date  Covid-19 vaccine status: Completed vaccines  Qualifies for Shingles Vaccine? Yes   Zostavax completed No   Shingrix Completed?: No.    Education has been provided regarding the importance of this vaccine. Patient has been advised to call insurance company to determine out of pocket expense if they have not yet received this vaccine. Advised may also receive vaccine at local pharmacy or Health Dept. Verbalized acceptance and understanding.  Screening Tests Health Maintenance  Topic Date Due   DTaP/Tdap/Td (2 - Td or Tdap) 10/12/2021   COVID-19 Vaccine (5 - 2023-24 season) 09/26/2022   MAMMOGRAM  10/09/2022   Lung Cancer Screening  12/03/2022 (Originally 04/08/1996)   DEXA SCAN  03/30/2023 (Originally 05/19/2019)   Medicare Annual Wellness (AWV)  12/02/2023   Colonoscopy  04/23/2025   Pneumonia Vaccine 67+ Years old  Completed   INFLUENZA VACCINE  Completed   Hepatitis C Screening  Completed   HPV VACCINES  Aged Out   Fecal DNA (Cologuard)  Discontinued   Zoster Vaccines- Shingrix  Discontinued    Health Maintenance  Health Maintenance Due  Topic Date Due   DTaP/Tdap/Td (2 - Td or Tdap) 10/12/2021   COVID-19 Vaccine (5 - 2023-24 season) 09/26/2022   MAMMOGRAM  10/09/2022    Colorectal cancer screening: No longer required. - had last one 04/23/20 and q3 years  Mammogram status: No longer required due to age.  Bone Density status: Completed 05/18/17. Results  reflect: Bone density results: OSTEOPENIA. Repeat every 5 years.  Lung Cancer Screening: (Low Dose CT Chest recommended if Age 69-80 years, 20 pack-year currently smoking OR have quit w/in 15years.) does qualify.   Lung Cancer Screening Referral: declined  Additional Screening:  Hepatitis C Screening: does qualify; Completed 02/21/13  Vision Screening: Recommended annual ophthalmology exams for early detection of glaucoma and other disorders of the eye. Is the patient up to date with their annual eye exam?  Yes  Who is the provider or what is the name of the office in which the patient attends annual eye exams? [facility] If pt is not established with a provider, would they like to be referred to a provider to establish care? No .   Dental Screening: Recommended annual dental exams for proper oral hygiene   Community Resource Referral / Chronic Care Management: CRR required this visit?  No   CCM required this visit?  No     Plan:     I have personally reviewed and noted the following in the patient's chart:   Medical and social history Use of alcohol, tobacco or illicit drugs  Current medications and supplements including opioid prescriptions. Patient is not currently taking opioid prescriptions. Functional ability and status Nutritional status Physical activity Advanced directives List of other physicians Hospitalizations, surgeries, and ER visits in previous 12 months Vitals Screenings to include cognitive, depression, and falls Referrals and appointments  In addition, I have reviewed and discussed with patient certain preventive protocols, quality metrics, and best practice recommendations. A written personalized care plan for preventive services as well as general preventive health recommendations were provided to patient.     Hal Hope, LPN   16/01/958   After Visit Summary: (MyChart) Due to this being a telephonic visit, the after visit summary with  patients personalized plan was offered to patient via MyChart   Nurse Notes: none

## 2022-12-20 DIAGNOSIS — Z4889 Encounter for other specified surgical aftercare: Secondary | ICD-10-CM | POA: Diagnosis not present

## 2023-03-21 ENCOUNTER — Other Ambulatory Visit: Payer: Self-pay | Admitting: Family Medicine

## 2023-03-21 DIAGNOSIS — M109 Gout, unspecified: Secondary | ICD-10-CM

## 2023-03-30 NOTE — Progress Notes (Signed)
 Name: Colleen [name]   MRN: [id number]    DOB: August 19, 1945   Date:03/31/2023       Progress Note  Chief Complaint  Patient presents with   Medical Management of Chronic Issues     Subjective:   Colleen [name] is a 78 y.o. female, presents to clinic for routine f/up  No f/up since preop visit last Oct before her knee surgery She reports a lot of complications with her second knee surgery including anemia and drop in blood pressure   HTN on hydrochlorothiazide 25 mg and enalapril 2.5 mg daily- states she is not taking enalapril - she had low BP with surgery and is starting to take it again here and there BP Readings from Last 3 Encounters:  03/31/23 130/68  09/30/22 138/70  07/26/22 128/76    Hyperlipidemia: Currently treated with lovastatin, pt reports good med compliance Last Lipids: Lab Results  Component Value Date   CHOL 155 09/30/2022   HDL 69 09/30/2022   LDLCALC 71 09/30/2022   TRIG 70 09/30/2022   CHOLHDL 2.2 09/30/2022    Breztri change inhalers due to insurance coverage - she didn't like the change in inhaler and used her left over symbicort, no recent exacerbations (since May) - rarely needing albuterol, she has much better controlled respiratory sx with breztri and other cardiac issues controlled   Anemia - last labs with [facility] hem/onc were oct - reviewed  Knee surgery last Oct, no f/up here or with specialists since  Gout on allopurinol Lab Results  Component Value Date   LABURIC 5.2 09/01/2020      Working on diet and losing weight Wt Readings from Last 5 Encounters:  03/31/23 204 lb (92.5 kg)  09/30/22 220 lb 6.4 oz (100 kg)  07/26/22 218 lb 12.8 oz (99.2 kg)  05/14/22 226 lb 11.2 oz (102.8 kg)  03/30/22 233 lb 11.2 oz (106 kg)   BMI Readings from Last 5 Encounters:  03/31/23 32.93 kg/m  09/30/22 35.57 kg/m  07/26/22 35.32 kg/m  05/14/22 36.59 kg/m  03/30/22 37.72 kg/m        Current Outpatient Medications:    acetaminophen  (TYLENOL) 650 MG CR tablet, Take 650 mg by mouth every 8 (eight) hours as needed for pain., Disp: , Rfl:    albuterol (VENTOLIN HFA) 108 (90 Base) MCG/ACT inhaler, Inhale 2 puffs into the lungs every 4 (four) hours as needed for wheezing or shortness of breath., Disp: 8 g, Rfl: 2   allopurinol (ZYLOPRIM) 100 MG tablet, Take 2 tablets by mouth once daily, Disp: 180 tablet, Rfl: 0   Blood Pressure Monitoring (ADULT BLOOD PRESSURE CUFF LG) KIT, 1 each by Does not apply route daily., Disp: 1 kit, Rfl: 0   Budeson-Glycopyrrol-Formoterol (BREZTRI AEROSPHERE) 160-9-4.8 MCG/ACT AERO, Inhale 2 puffs into the lungs 2 (two) times daily., Disp: 3 each, Rfl: 1   cholecalciferol (VITAMIN D3) 25 MCG (1000 UNIT) tablet, Take 1,000 Units by mouth daily., Disp: , Rfl:    ELIQUIS 5 MG TABS tablet, Take 5 mg by mouth 2 (two) times daily., Disp: , Rfl:    famotidine (PEPCID) 20 MG tablet, Take 20 mg by mouth daily as needed for heartburn or indigestion., Disp: , Rfl:    hydrochlorothiazide (MICROZIDE) 12.5 MG capsule, Take 1 capsule (12.5 mg total) by mouth daily., Disp: 90 capsule, Rfl: 1   lovastatin (MEVACOR) 40 MG tablet, Take 1 tablet (40 mg total) by mouth at bedtime., Disp: 90 tablet, Rfl: 1  simethicone (GAS-X) 80 MG chewable tablet, Chew 1 tablet (80 mg total) by mouth 2 (two) times a day., Disp: 60 tablet, Rfl: 2   triamcinolone cream (KENALOG) 0.1 %, APPLY TO AFFECTED AREA TWICE DAILY IF NEEDED. NO MORE THAN 1 WEEK AT A TIME, THEN TAKE A BREAK FOR 2 WEEKS., Disp: 80 g, Rfl: 0   enalapril (VASOTEC) 2.5 MG tablet, Take 1 tablet (2.5 mg total) by mouth daily. (Patient not taking: Reported on 03/31/2023), Disp: 90 tablet, Rfl: 3   mupirocin cream (BACTROBAN) 2 %, Apply 1 application topically 2 (two) times daily. (Patient not taking: Reported on 03/31/2023), Disp: 15 g, Rfl: 1  Patient Active Problem List   Diagnosis Date Noted   Acquired trigger finger of right middle finger 07/24/2021   Acquired trigger finger of  right ring finger 07/24/2021   Trigger finger of right hand 06/26/2021   Hidradenitis suppurativa 11/12/2020   Status post gastroplasty-1980s 06/25/2020   CLE (columnar lined esophagus)    Hiatal hernia    Gastric nodule    Angiodysplasia of stomach and duodenum    Cecal polyp    Polyp of descending colon    Polyp of transverse colon    Atrial fibrillation (HCC) 06/14/2019   Current use of anticoagulant therapy 06/14/2019   Iron deficiency anemia 06/14/2019   Essential (hemorrhagic) thrombocythemia (HCC) 06/14/2019   GERD (gastroesophageal reflux disease) 04/15/2019   Chronic low back pain 04/15/2019   Right pulmonary embolus (HCC) 04/15/2019   DOE (dyspnea on exertion) 03/13/2019   Atherosclerosis of both carotid arteries 09/20/2018   Spinal stenosis of lumbar region with neurogenic claudication 09/20/2018   Aortic atherosclerosis (HCC) 09/05/2018   At high risk for falls 09/05/2018   Osteopenia 05/18/2017   Stage 3 chronic kidney disease (HCC) 04/21/2017   Morbid obesity (HCC) 04/21/2017   Trigger middle finger of left hand 02/28/2017   Chronic gouty arthropathy without tophi 11/29/2016   Hypergammaglobulinemia 11/29/2016   Osteoarthritis of both knees 11/29/2016   Bilateral primary osteoarthritis of knee 11/29/2016   Allergic rhinitis, seasonal 07/30/2014   Gout 07/30/2014   HLD (hyperlipidemia) 07/30/2014   Osteoarthritis of knee 07/30/2014   Prediabetes 07/30/2014   Asthma, mild persistent 07/30/2014   COPD (chronic obstructive pulmonary disease) (HCC) 07/09/2008   Essential hypertension 06/22/2006    Past Surgical History:  Procedure Laterality Date   BACK SURGERY     BREAST BIOPSY Left 04/08/2014   intraductal papilloma 5 mm   COLONOSCOPY WITH PROPOFOL N/A 04/23/2020   Procedure: COLONOSCOPY WITH PROPOFOL;  Surgeon: Pasty Spillers, MD;  Location: [facility] ENDOSCOPY;  Service: Endoscopy;  Laterality: N/A;   ESOPHAGOGASTRODUODENOSCOPY (EGD) WITH PROPOFOL N/A  04/23/2020   Procedure: ESOPHAGOGASTRODUODENOSCOPY (EGD) WITH PROPOFOL;  Surgeon: Pasty Spillers, MD;  Location: [facility] ENDOSCOPY;  Service: Endoscopy;  Laterality: N/A;   GIVENS CAPSULE STUDY N/A 01/28/2022   Procedure: GIVENS CAPSULE STUDY;  Surgeon: Wyline Mood, MD;  Location[facility] ENDOSCOPY;  Service: Gastroenterology;  Laterality: N/A;   LUMBAR LAMINECTOMY     TONSILLECTOMY      Family History  Problem Relation Age of Onset   Diabetes Mother    Lung cancer Father    Diabetes Sister    Congestive Heart Failure Sister    Glaucoma Sister    Breast cancer Maternal Aunt     Social History   Tobacco Use   Smoking status: Former    Current packs/day: 0.00    Average packs/day: 1.5 packs/day for 20.0 years (30.0 ttl  pk-yrs)    Types: Cigarettes    Start date: 11/13/1988    Quit date: 11/13/2008    Years since quitting: 14.3   Smokeless tobacco: Never   Tobacco comments:    smoking cessation materials not required  Vaping Use   Vaping status: Never Used  Substance Use Topics   Alcohol use: Not Currently   Drug use: No     Allergies  Allergen Reactions   Nsaids     Other Reaction(s): Not available    Health Maintenance  Topic Date Due   Lung Cancer Screening  Never done   DEXA SCAN  05/19/2019   MAMMOGRAM  10/09/2022   COVID-19 Vaccine (5 - 2024-25 season) 04/15/2023 (Originally 09/26/2022)   DTaP/Tdap/Td (2 - Td or Tdap) 03/29/2024 (Originally 10/12/2021)   Medicare Annual Wellness (AWV)  12/02/2023   Colonoscopy  04/23/2025   Pneumonia Vaccine 74+ Years old  Completed   INFLUENZA VACCINE  Completed   Hepatitis C Screening  Completed   HPV VACCINES  Aged Out   Fecal DNA (Cologuard)  Discontinued   Zoster Vaccines- Shingrix  Discontinued    Chart Review Today: I personally reviewed active problem list, medication list, allergies, family history, social history, health maintenance, notes from last encounter, lab results, imaging with the patient/caregiver  today.   Review of Systems  Constitutional: Negative.   HENT: Negative.    Eyes: Negative.   Respiratory: Negative.    Cardiovascular: Negative.   Gastrointestinal: Negative.   Endocrine: Negative.   Genitourinary: Negative.   Musculoskeletal: Negative.   Skin: Negative.   Allergic/Immunologic: Negative.   Neurological: Negative.   Hematological: Negative.   Psychiatric/Behavioral: Negative.    All other systems reviewed and are negative.     Objective:   Vitals:   03/31/23 0852  BP: 130/68  Resp: 16  Weight: 204 lb (92.5 kg)  Height: 5\' 6"  (1.676 m)     Body mass index is 32.93 kg/m.  Physical Exam Vitals and nursing note reviewed.  Constitutional:      General: She is not in acute distress.    Appearance: Normal appearance. She is well-developed. She is not ill-appearing, toxic-appearing or diaphoretic.  HENT:     Head: Normocephalic and atraumatic.     Nose: Nose normal.  Eyes:     General:        Right eye: No discharge.        Left eye: No discharge.     Conjunctiva/sclera: Conjunctivae normal.  Neck:     Trachea: No tracheal deviation.  Cardiovascular:     Rate and Rhythm: Normal rate and regular rhythm.     Pulses: Normal pulses.     Heart sounds: Normal heart sounds.  Pulmonary:     Effort: Pulmonary effort is normal. No respiratory distress.     Breath sounds: Normal breath sounds. No stridor.  Musculoskeletal:        General: Normal range of motion.  Skin:    General: Skin is warm and dry.     Findings: No rash.  Neurological:     Mental Status: She is alert.     Motor: No abnormal muscle tone.     Coordination: Coordination normal.     Gait: Gait abnormal.  Psychiatric:        Behavior: Behavior normal.         Assessment & Plan:   Breast cancer screening by mammogram -     3D Screening Mammogram, Left and Right; Future  Chronic obstructive pulmonary disease, unspecified COPD type (HCC) Assessment & Plan: Respiratory  symptoms have much improved she is managed on Breztri, per pulmonology   Paroxysmal atrial fibrillation [facility]) Assessment & Plan: Proximal A-fib, anticoagulated on Eliquis No palpitations, on exam RRR today   Morbid obesity (HCC) Assessment & Plan: BMI decreasing, multiple associated comorbidities including A-fib, hypertension, COPD, osteoarthritis, hyperlipidemia, CKD She has been working on [employer] and now is more mobile after her knee replacement surgeries   Essential (hemorrhagic) thrombocythemia (HCC) Assessment & Plan: Per [facility] hematology  Orders: -     CBC with Differential/Platelet  Stage 3a chronic kidney disease (HCC) Assessment & Plan: Recheck renal function  Orders: -     COMPLETE METABOLIC PANEL WITH GFR  Prediabetes Assessment & Plan: Not on medications, recheck labs today  Orders: -     COMPLETE METABOLIC PANEL WITH GFR -     Hemoglobin A1c  Hyperlipidemia, unspecified hyperlipidemia type Assessment & Plan: Patient on lovastatin, labs checked in September will continue to monitor annually no change medications at this time  Orders: -     COMPLETE METABOLIC PANEL WITH GFR  Essential hypertension Assessment & Plan: Pt had BP med stopped with surgery many months ago, restarting the med occassionally BP near goal today She should restart enalapril daily and hydrochlorothiazide     Orders: -     COMPLETE METABOLIC PANEL WITH GFR  Atherosclerosis of both carotid arteries  Mixed hyperlipidemia Assessment & Plan: Patient on lovastatin, labs checked in September will continue to monitor annually no change medications at this time  Orders: -     Lovastatin; Take 1 tablet (40 mg total) by mouth at bedtime.  Dispense: 90 tablet; Refill: 1  Aortic atherosclerosis (HCC) Assessment & Plan: On statin, monitoring  Orders: -     Lovastatin; Take 1 tablet (40 mg total) by mouth at bedtime.  Dispense: 90 tablet; Refill: 1  Postmenopausal estrogen  deficiency -     DG Bone Density; Future  Encounter for medication monitoring -     COMPLETE METABOLIC PANEL WITH GFR -     Hemoglobin A1c -     CBC with Differential/Platelet -     Uric acid  Idiopathic chronic gout of left foot without tophus Assessment & Plan: No recent flares, recheck labs  Orders: -     Uric acid  Chronic obstructive pulmonary disease with acute exacerbation (HCC) Assessment & Plan: Respiratory symptoms have much improved she is managed on Breztri, per pulmonology  Orders: -     Albuterol Sulfate HFA; Inhale 2 puffs into the lungs every 4 (four) hours as needed for wheezing or shortness of breath.  Dispense: 8 g; Refill: 2  Other abnormalities of gait and mobility Patient's ability to ambulate has improved since having bilateral knee replacements but she is having difficulty with standing straight up and is still leaning forward using her walker she would like physical therapy consult PT referral placed      Return in about 4 months (around 07/31/2023) for Routine follow-up.   Danelle Berry, PA-C 03/31/23 9:09 AM

## 2023-03-31 ENCOUNTER — Ambulatory Visit (INDEPENDENT_AMBULATORY_CARE_PROVIDER_SITE_OTHER): Payer: Self-pay | Admitting: Family Medicine

## 2023-03-31 ENCOUNTER — Encounter: Payer: Self-pay | Admitting: Family Medicine

## 2023-03-31 ENCOUNTER — Telehealth: Payer: Self-pay | Admitting: Family Medicine

## 2023-03-31 VITALS — BP 130/68 | Resp 16 | Ht 66.0 in | Wt 204.0 lb

## 2023-03-31 DIAGNOSIS — M1A0720 Idiopathic chronic gout, left ankle and foot, without tophus (tophi): Secondary | ICD-10-CM

## 2023-03-31 DIAGNOSIS — R7303 Prediabetes: Secondary | ICD-10-CM

## 2023-03-31 DIAGNOSIS — R2689 Other abnormalities of gait and mobility: Secondary | ICD-10-CM

## 2023-03-31 DIAGNOSIS — N1831 Chronic kidney disease, stage 3a: Secondary | ICD-10-CM

## 2023-03-31 DIAGNOSIS — I10 Essential (primary) hypertension: Secondary | ICD-10-CM

## 2023-03-31 DIAGNOSIS — E782 Mixed hyperlipidemia: Secondary | ICD-10-CM

## 2023-03-31 DIAGNOSIS — J449 Chronic obstructive pulmonary disease, unspecified: Secondary | ICD-10-CM | POA: Diagnosis not present

## 2023-03-31 DIAGNOSIS — I6523 Occlusion and stenosis of bilateral carotid arteries: Secondary | ICD-10-CM | POA: Diagnosis not present

## 2023-03-31 DIAGNOSIS — D473 Essential (hemorrhagic) thrombocythemia: Secondary | ICD-10-CM

## 2023-03-31 DIAGNOSIS — I480 Paroxysmal atrial fibrillation: Secondary | ICD-10-CM

## 2023-03-31 DIAGNOSIS — J441 Chronic obstructive pulmonary disease with (acute) exacerbation: Secondary | ICD-10-CM

## 2023-03-31 DIAGNOSIS — Z1231 Encounter for screening mammogram for malignant neoplasm of breast: Secondary | ICD-10-CM

## 2023-03-31 DIAGNOSIS — I700 Atherosclerosis of aorta: Secondary | ICD-10-CM | POA: Diagnosis not present

## 2023-03-31 DIAGNOSIS — Z5181 Encounter for therapeutic drug level monitoring: Secondary | ICD-10-CM | POA: Diagnosis not present

## 2023-03-31 DIAGNOSIS — E785 Hyperlipidemia, unspecified: Secondary | ICD-10-CM | POA: Diagnosis not present

## 2023-03-31 DIAGNOSIS — Z780 Asymptomatic menopausal state: Secondary | ICD-10-CM

## 2023-03-31 MED ORDER — ALBUTEROL SULFATE HFA 108 (90 BASE) MCG/ACT IN AERS: 2.0000 | INHALATION_SPRAY | RESPIRATORY_TRACT | 2 refills | Status: DC | PRN

## 2023-03-31 MED ORDER — LOVASTATIN 40 MG PO TABS
40.0000 mg | ORAL_TABLET | Freq: Every day | ORAL | 1 refills | Status: DC
Start: 2023-03-31 — End: 2023-08-09

## 2023-03-31 NOTE — Telephone Encounter (Signed)
 Pt wanted to remind you to place referral to physical therapy at the hospital. She would like a morning appointment and no Fridays.

## 2023-03-31 NOTE — Patient Instructions (Signed)
 Please call [place] and get your mammogram and bone density done  [facility] [address], [place], [place] [address] Scheduling phone #: [phone]

## 2023-04-01 LAB — CBC WITH DIFFERENTIAL/PLATELET
Absolute Lymphocytes: 2949 {cells}/uL (ref 850–3900)
Absolute Monocytes: 495 {cells}/uL (ref 200–950)
Basophils Absolute: 61 {cells}/uL (ref 0–200)
Basophils Relative: 0.6 %
Eosinophils Absolute: 71 {cells}/uL (ref 15–500)
Eosinophils Relative: 0.7 %
HCT: 40.7 % (ref 35.0–45.0)
Hemoglobin: 13.7 g/dL (ref 11.7–15.5)
MCH: 30.6 pg (ref 27.0–33.0)
MCHC: 33.7 g/dL (ref 32.0–36.0)
MCV: 90.8 fL (ref 80.0–100.0)
MPV: 10.7 fL (ref 7.5–12.5)
Monocytes Relative: 4.9 %
Neutro Abs: 6525 {cells}/uL (ref 1500–7800)
Neutrophils Relative %: 64.6 %
Platelets: 344 10*3/uL (ref 140–400)
RBC: 4.48 10*6/uL (ref 3.80–5.10)
RDW: 11.9 % (ref 11.0–15.0)
Total Lymphocyte: 29.2 %
WBC: 10.1 10*3/uL (ref 3.8–10.8)

## 2023-04-01 LAB — COMPLETE METABOLIC PANEL WITH GFR
AG Ratio: 1.1 (calc) (ref 1.0–2.5)
ALT: 12 U/L (ref 6–29)
AST: 19 U/L (ref 10–35)
Albumin: 3.8 g/dL (ref 3.6–5.1)
Alkaline phosphatase (APISO): 125 U/L (ref 37–153)
BUN: 15 mg/dL (ref 7–25)
CO2: 29 mmol/L (ref 20–32)
Calcium: 9.8 mg/dL (ref 8.6–10.4)
Chloride: 101 mmol/L (ref 98–110)
Creat: 0.73 mg/dL (ref 0.60–1.00)
Globulin: 3.6 g/dL (ref 1.9–3.7)
Glucose, Bld: 127 mg/dL — ABNORMAL HIGH (ref 65–99)
Potassium: 3.7 mmol/L (ref 3.5–5.3)
Sodium: 138 mmol/L (ref 135–146)
Total Bilirubin: 0.5 mg/dL (ref 0.2–1.2)
Total Protein: 7.4 g/dL (ref 6.1–8.1)
eGFR: 85 mL/min/{1.73_m2} (ref >=60)

## 2023-04-01 LAB — HEMOGLOBIN A1C
Hgb A1c MFr Bld: 7.8 %{Hb} — ABNORMAL HIGH (ref <5.7)
Mean Plasma Glucose: 177 mg/dL
eAG (mmol/L): 9.8 mmol/L

## 2023-04-01 LAB — URIC ACID: Uric Acid, Serum: 4.8 mg/dL (ref 2.5–7.0)

## 2023-04-04 ENCOUNTER — Encounter: Payer: Self-pay | Admitting: Family Medicine

## 2023-04-04 DIAGNOSIS — E119 Type 2 diabetes mellitus without complications: Secondary | ICD-10-CM | POA: Insufficient documentation

## 2023-04-08 NOTE — Progress Notes (Signed)
 Cardiology Clinic Note   Date: 04/11/2023 ID: TOVAH SLAVICK, DOB 01-Jan-1946, MRN [id number]  Primary Cardiologist:  Debbe Odea, MD  Chief Complaint   Colleen [name] is a 78 y.o. female who presents to the clinic today for routine follow up.   Patient Profile   Colleen [name] is followed by Dr. Azucena Cecil for the history outlined below.      Past medical history significant for: Palpitations/PAF.  Onset March 2021 in the setting of PE. 14-day ZIO 06/18/2019: HR 54 to 226 bpm, average 80 bpm. Predominantly sinus rhythm.  2818 runs of SVT fastest 5 beats max rate 226, longest 24.8 seconds average rate 151 bpm.  SVT was detected within +/- 45 seconds of symptomatic patient events.  Some episodes of SVT may be possible A. tach with variable block.  Occasional PACs (2.7%).  Rare PVCs. Dyspnea. Echo 04/13/2019: EF 55 to 60%.  No RWMA.  Normal diastolic parameters.  Normal RV size/function.  Mild MR. Nuclear stress test 06/04/2019: Normal, low risk study. Carotid artery disease. Carotid ultrasound 09/10/2018: Mild atherosclerotic disease of bilateral carotid arteries <50% bilaterally. Hypertension. Hyperlipidemia. Lipid panel 09/30/2022: LDL 71, HDL 69, TG 70, total 155. COPD. PE. T2DM. Gout. CKD stage III.  In summary, patient was first evaluated by Dr. Azucena Cecil on 03/30/2019 for abnormal EKG at the request of Lavada Mesi, PA-C.  Patient reported a long history of DOE improved with inhalers.  She reported occasional skipped beats.  EKG PCPs office demonstrated PACs.  Given patient's risk factors including stable dyspnea patient was evaluated with echo and nuclear stress test as detailed above.  In late March 2021 patient was admitted to [facility] for palpitation and found to have a PE per chest CTA.  During hospital admission she was noted to have PAF on telemetry.  She was discharged on Eliquis.  Patient was last seen in the office by Charlsie Quest, NP back on 12/14/2021 for routine  follow-up.  She was working on losing weight in order to be a surgical candidate for any type of orthopedic procedure.  She had lost close to 60 pounds at that point and still had more to go to be eligible for knee surgery.  BP was well-controlled at the time of her visit.     History of Present Illness    Today, patient is doing well. In the last 2 years she has lost about 100 lb. She had both knees replaced and has healed very well from her surgeries. She performs physical therapy exercises at home. Her PCP has referred her to physical therapy again to work on balance and gait training so she can ambulate without a walker or cane. Patient denies shortness of breath, dyspnea on exertion, lower extremity edema, orthopnea or PND. No chest pain, pressure, or tightness. No palpitations. She denies blood in stool or urine.      ROS: All other systems reviewed and are otherwise negative except as noted in History of Present Illness.  EKGs/Labs Reviewed    EKG Interpretation Date/Time:  Monday April 11 2023 08:14:16 EDT Ventricular Rate:  65 PR Interval:    QRS Duration:  84 QT Interval:  388 QTC Calculation: 403 R Axis:   -21  Text Interpretation: Sinus rhythm with sinus arrythmia Low voltage QRS Cannot rule out Anterior infarct (cited on or before 23-Aug-2021) Reconfirmed by Carlos Levering [phone]) on 04/11/2023 9:10:59 AM   03/31/2023: ALT 12; AST 19; BUN 15; Creat 0.73; Potassium 3.7; Sodium 138  03/31/2023: Hemoglobin 13.7; WBC 10.1    Risk Assessment/Calculations     CHA2DS2-VASc Score = 5   This indicates a 7.2% annual risk of stroke. The patient's score is based upon: CHF History: 0 HTN History: 1 Diabetes History: 1 Stroke History: 0 Vascular Disease History: 0 Age Score: 2 Gender Score: 1             Physical Exam    VS:  BP 118/68 (BP Location: Left Arm, Patient Position: Sitting)   Pulse 79   Ht 5\' 6"  (1.676 m)   Wt 205 lb 12.8 oz (93.4 kg)   SpO2 98%   BMI  33.22 kg/m  , BMI Body mass index is 33.22 kg/m.  GEN: Well nourished, well developed, in no acute distress. Neck: No JVD or carotid bruits. Cardiac:  RRR. No murmurs. No rubs or gallops.   Respiratory:  Respirations regular and unlabored. Clear to auscultation without rales, wheezing or rhonchi. GI: Soft, nontender, nondistended. Extremities: Radials/DP/PT 2+ and equal bilaterally. No clubbing or cyanosis. No edema.  Skin: Warm and dry, no rash. Neuro: Strength intact.  Assessment & Plan   Palpitations/PAF Onset of PAF March 2021 in the setting of PE.  14-day ZIO May 2021 demonstrated predominantly sinus rhythm with runs of symptomatic SVT.  No spontaneous bleeding concerns. Patient denies palpitations, lightheadedness, dizziness, presyncope or syncope. RRR on exam today.  -Continue Eliquis. Managed by hematology (patient with history of PE).  Hypertension BP today 118/68.  -Continue enalapril, HCTZ.  Hyperlipidemia LDL September 2024 71, at goal. -Continue lovastatin.  Disposition: Return in 1 year or sooner as needed.          Signed, Etta Grandchild. Fani Rotondo, DNP, NP-C

## 2023-04-11 ENCOUNTER — Ambulatory Visit: Payer: PPO | Attending: Student | Admitting: Student

## 2023-04-11 ENCOUNTER — Encounter: Payer: Self-pay | Admitting: Student

## 2023-04-11 VITALS — BP 118/68 | HR 79 | Ht 66.0 in | Wt 205.8 lb

## 2023-04-11 DIAGNOSIS — E785 Hyperlipidemia, unspecified: Secondary | ICD-10-CM

## 2023-04-11 DIAGNOSIS — I10 Essential (primary) hypertension: Secondary | ICD-10-CM | POA: Diagnosis not present

## 2023-04-11 DIAGNOSIS — R002 Palpitations: Secondary | ICD-10-CM

## 2023-04-11 DIAGNOSIS — I480 Paroxysmal atrial fibrillation: Secondary | ICD-10-CM | POA: Diagnosis not present

## 2023-04-11 NOTE — Patient Instructions (Signed)
 Medication Instructions:  Your Physician recommend you continue on your current medication as directed.    *If you need a refill on your cardiac medications before your next appointment, please call your pharmacy*   Lab Work: None ordered at this time    Follow-Up: At [facility], you and your health needs are our priority.  As part of our continuing mission to provide you with exceptional heart care, we have created designated Provider Care Teams.  These Care Teams include your primary Cardiologist (physician) and Advanced Practice Providers (APPs -  Physician Assistants and Nurse Practitioners) who all work together to provide you with the care you need, when you need it.  We recommend signing up for the patient portal called "MyChart".  Sign up information is provided on this After Visit Summary.  MyChart is used to connect with patients for Virtual Visits (Telemedicine).  Patients are able to view lab/test results, encounter notes, upcoming appointments, etc.  Non-urgent messages can be sent to your provider as well.   To learn more about what you can do with MyChart, go to [email or web address].    Your next appointment:   1 year(s)  Provider:   You may see Debbe Odea, MD or Carlos Levering, NP

## 2023-04-14 NOTE — Assessment & Plan Note (Signed)
 Per [facility] hematology

## 2023-04-14 NOTE — Assessment & Plan Note (Signed)
 Patient on lovastatin, labs checked in September will continue to monitor annually no change medications at this time

## 2023-04-14 NOTE — Assessment & Plan Note (Signed)
Recheck renal function. ?

## 2023-04-14 NOTE — Assessment & Plan Note (Signed)
 Not on medications, recheck labs today

## 2023-04-14 NOTE — Assessment & Plan Note (Signed)
On statin, monitoring 

## 2023-04-14 NOTE — Assessment & Plan Note (Signed)
 No recent flares, recheck labs

## 2023-04-14 NOTE — Assessment & Plan Note (Signed)
 Respiratory symptoms have much improved she is managed on Breztri, per pulmonology

## 2023-04-14 NOTE — Assessment & Plan Note (Signed)
 Proximal A-fib, anticoagulated on Eliquis No palpitations, on exam RRR today

## 2023-04-14 NOTE — Assessment & Plan Note (Signed)
 Pt had BP med stopped with surgery many months ago, restarting the med occassionally BP near goal today She should restart enalapril daily and hydrochlorothiazide

## 2023-04-14 NOTE — Assessment & Plan Note (Signed)
 BMI decreasing, multiple associated comorbidities including A-fib, hypertension, COPD, osteoarthritis, hyperlipidemia, CKD She has been working on [employer] and now is more mobile after her knee replacement surgeries

## 2023-04-15 ENCOUNTER — Ambulatory Visit
Admission: RE | Admit: 2023-04-15 | Discharge: 2023-04-15 | Disposition: A | Discharge status: Home / Self Care | Source: Ambulatory Visit | Attending: Family Medicine | Admitting: Family Medicine

## 2023-04-15 DIAGNOSIS — Z780 Asymptomatic menopausal state: Secondary | ICD-10-CM | POA: Diagnosis not present

## 2023-04-15 DIAGNOSIS — Z1231 Encounter for screening mammogram for malignant neoplasm of breast: Secondary | ICD-10-CM | POA: Diagnosis not present

## 2023-04-15 DIAGNOSIS — M85852 Other specified disorders of bone density and structure, left thigh: Secondary | ICD-10-CM | POA: Diagnosis not present

## 2023-04-19 DIAGNOSIS — Z96651 Presence of right artificial knee joint: Secondary | ICD-10-CM | POA: Diagnosis not present

## 2023-04-27 ENCOUNTER — Telehealth: Payer: Self-pay

## 2023-04-27 NOTE — Telephone Encounter (Signed)
 Called w/no answer. Lvm

## 2023-04-27 NOTE — Telephone Encounter (Signed)
 Copied from CRM [id number]. Topic: General - Other >> Apr 27, 2023  3:15 PM Almira Coaster wrote: Reason for CRM: Patient was seen on 03/31/2023 and had not gotten her lab results or a call to have her physical therapy scheduled, I advised patient of the message from Danelle Berry from 04/04/2023, patient did have questions regarding her diabetes. Called [facility] to see if I can transfer patient but was advised they were seeing patients at the moment. Provided patient with phone number to physical therapy department to schedule an appointment.

## 2023-04-28 ENCOUNTER — Ambulatory Visit: Attending: Family Medicine

## 2023-04-28 DIAGNOSIS — R269 Unspecified abnormalities of gait and mobility: Secondary | ICD-10-CM | POA: Insufficient documentation

## 2023-04-28 DIAGNOSIS — R262 Difficulty in walking, not elsewhere classified: Secondary | ICD-10-CM | POA: Insufficient documentation

## 2023-04-28 DIAGNOSIS — M6281 Muscle weakness (generalized): Secondary | ICD-10-CM | POA: Diagnosis not present

## 2023-04-28 DIAGNOSIS — R2681 Unsteadiness on feet: Secondary | ICD-10-CM | POA: Insufficient documentation

## 2023-04-28 DIAGNOSIS — R2689 Other abnormalities of gait and mobility: Secondary | ICD-10-CM | POA: Insufficient documentation

## 2023-04-28 DIAGNOSIS — R278 Other lack of coordination: Secondary | ICD-10-CM | POA: Insufficient documentation

## 2023-04-28 NOTE — Therapy (Signed)
 OUTPATIENT PHYSICAL THERAPY NEURO EVALUATION   Patient Name: Colleen [name] MRN: [id number] DOB:1945/06/11, 78 y.o., female Today's Date: 04/29/2023   PCP: Danelle Berry, PA-C REFERRING PROVIDER: Danelle Berry, PA-C  END OF SESSION:  PT End of Session - 04/28/23 0930     Visit Number 1    Number of Visits 24    Date for PT Re-Evaluation 07/21/23    PT Start Time 0810    PT Stop Time 0855    PT Time Calculation (min) 45 min             Past Medical History:  Diagnosis Date   Asthma    Cataract    COPD (chronic obstructive pulmonary disease) (HCC)    GERD (gastroesophageal reflux disease)    Gout    History of pulmonary embolus (PE)    Hyperlipidemia    Hypertension    Osteoarthrosis    Prediabetes    Past Surgical History:  Procedure Laterality Date   BACK SURGERY     BREAST BIOPSY Left 04/08/2014   intraductal papilloma 5 mm   COLONOSCOPY WITH PROPOFOL N/A 04/23/2020   Procedure: COLONOSCOPY WITH PROPOFOL;  Surgeon: Pasty Spillers, MD;  Location: [facility] ENDOSCOPY;  Service: Endoscopy;  Laterality: N/A;   ESOPHAGOGASTRODUODENOSCOPY (EGD) WITH PROPOFOL N/A 04/23/2020   Procedure: ESOPHAGOGASTRODUODENOSCOPY (EGD) WITH PROPOFOL;  Surgeon: Pasty Spillers, MD;  Location: [facility] ENDOSCOPY;  Service: Endoscopy;  Laterality: N/A;   GIVENS CAPSULE STUDY N/A 01/28/2022   Procedure: GIVENS CAPSULE STUDY;  Surgeon: Wyline Mood, MD;  Location: [facility] ENDOSCOPY;  Service: Gastroenterology;  Laterality: N/A;   LUMBAR LAMINECTOMY     TONSILLECTOMY     Patient Active Problem List   Diagnosis Date Noted   New onset type 2 diabetes mellitus (HCC) 04/04/2023   Acquired trigger finger of right middle finger 07/24/2021   Acquired trigger finger of right ring finger 07/24/2021   Trigger finger of right hand 06/26/2021   Hidradenitis suppurativa 11/12/2020   Status post gastroplasty-1980s 06/25/2020   CLE (columnar lined esophagus)    Hiatal hernia    Gastric nodule     Angiodysplasia of stomach and duodenum    Cecal polyp    Polyp of descending colon    Polyp of transverse colon    Atrial fibrillation (HCC) 06/14/2019   Current use of anticoagulant therapy 06/14/2019   Iron deficiency anemia 06/14/2019   Essential (hemorrhagic) thrombocythemia (HCC) 06/14/2019   GERD (gastroesophageal reflux disease) 04/15/2019   Chronic low back pain 04/15/2019   Right pulmonary embolus (HCC) 04/15/2019   DOE (dyspnea on exertion) 03/13/2019   Atherosclerosis of both carotid arteries 09/20/2018   Spinal stenosis of lumbar region with neurogenic claudication 09/20/2018   Aortic atherosclerosis (HCC) 09/05/2018   At high risk for falls 09/05/2018   Osteopenia 05/18/2017   Stage 3 chronic kidney disease (HCC) 04/21/2017   Morbid obesity (HCC) 04/21/2017   Trigger middle finger of left hand 02/28/2017   Chronic gouty arthropathy without tophi 11/29/2016   Hypergammaglobulinemia 11/29/2016   Osteoarthritis of both knees 11/29/2016   Bilateral primary osteoarthritis of knee 11/29/2016   Allergic rhinitis, seasonal 07/30/2014   Gout 07/30/2014   HLD (hyperlipidemia) 07/30/2014   Osteoarthritis of knee 07/30/2014   Prediabetes 07/30/2014   Asthma, mild persistent 07/30/2014   COPD (chronic obstructive pulmonary disease) (HCC) 07/09/2008   Essential hypertension 06/22/2006    ONSET DATE: 6 months or so  REFERRING DIAG: R26.89 (ICD-10-CM) - Other abnormalities of gait and mobility  THERAPY DIAG:  Abnormality of gait and mobility - Plan: PT plan of care cert/re-cert  Difficulty in walking, not elsewhere classified - Plan: PT plan of care cert/re-cert  Muscle weakness (generalized) - Plan: PT plan of care cert/re-cert  Other abnormalities of gait and mobility - Plan: PT plan of care cert/re-cert  Other lack of coordination - Plan: PT plan of care cert/re-cert  Unsteadiness on feet - Plan: PT plan of care cert/re-cert  Rationale for Evaluation and Treatment:  Rehabilitation  SUBJECTIVE:                                                                                                                                                                                             SUBJECTIVE STATEMENT: Patient report she has been struggling to walk since her knee replacements last year and states she just can't stand up straight and walk good. She denies any falls and states using cane mostly but has walk for longer distances.   Pt accompanied by: self  PERTINENT HISTORY: Patient is a 78 year old female present today for Eval for gait/mobility due to prolonged B/L knee pain pt was hunching forward using walker (per MD referral). Patient has PMH of  2 Total knee replacement in 2024- most recent left knee in October 2024. States she did receive HHPT but no outpatient PT for either knee.   PAIN:  Are you having pain? No  PRECAUTIONS: Fall  RED FLAGS: None   WEIGHT BEARING RESTRICTIONS: No  FALLS: Has patient fallen in last 6 months? No   LIVING ENVIRONMENT: Lives with: lives alone Lives in: House/apartment Stairs: Yes: External: 4 steps; on right going up Has following equipment at home: Single point cane and Walker - 2 wheeled  PLOF: Independent  PATIENT GOALS: I want to be able to stand up straighter and walk without walker  OBJECTIVE:  Note: Objective measures were completed at Evaluation unless otherwise noted.  DIAGNOSTIC FINDINGS: no recent imaging of knee available in chart.  COGNITION: Overall cognitive status: Within functional limits for tasks assessed   SENSATION: WFL  COORDINATION: WNL  EDEMA:  None observed   POSTURE: rounded shoulders, forward head, and increased thoracic kyphosis  LOWER EXTREMITY ROM:     Active  Right Eval Left Eval  Hip flexion    Hip extension    Hip abduction    Hip adduction    Hip internal rotation    Hip external rotation    Knee flexion 108 97  Knee extension    Ankle  dorsiflexion    Ankle plantarflexion    Ankle inversion    Ankle  eversion     (Blank rows = not tested)  LOWER EXTREMITY MMT:    MMT Right Eval Left Eval  Hip flexion 4- 4-  Hip extension    Hip abduction 4- 4-  Hip adduction    Hip internal rotation 4- 4  Hip external rotation 4 4  Knee flexion 4 4  Knee extension 4 4  Ankle dorsiflexion 4   Ankle plantarflexion    Ankle inversion    Ankle eversion    (Blank rows = not tested)  BED MOBILITY:  Patient reports doing okay  TRANSFERS: Assistive device utilized: Single point cane  Sit to stand: Modified independence Stand to sit: Modified independence Chair to chair: Modified independence Floor:  Not assessed   GAIT: Gait pattern: step to pattern, decreased arm swing- Right, decreased arm swing- Left, decreased step length- Right, decreased step length- Left, decreased stance time- Right, decreased stance time- Left, and decreased stride length Distance walked: < 100 feet Assistive device utilized: Single point cane Level of assistance: SBA Comments: forward trunk posture, minimal step length bilaterally  FUNCTIONAL TESTS:  5 times sit to stand: 30.63 sec with BUE Support Timed up and go (TUG): 28.1 sec with SPC 6 minute walk test: to be assessed visit #2 Berg Balance Scale: to be assessed visit  10 Meter walk test= 25.07 and 27.2 sec with SPC= 0.38 m/s avg PATIENT SURVEYS:  LEFS 39/80                                                                                                                              TREATMENT DATE: 04/28/2023    PATIENT EDUCATION: Education details: Purpose of PT; Discussion of post op knee rehab; purpose of functional outcome measure and goals; Plan of care Person educated: Patient Education method: Explanation Education comprehension: verbalized understanding  HOME EXERCISE PROGRAM: To be initiated next 1-2 visit  GOALS: Goals reviewed with patient? Yes  SHORT TERM GOALS:  Target date: 06/09/2023  Pt will be independent with HEP in order to increase strength and improve  function at home and work Baseline: Goal status: INITIAL   LONG TERM GOALS: Target date: 07/21/2023  Pt will increase LEFS by at least 9 points in order to demonstrate significant improvement in lower extremity function. Baseline: EVAL= 39/80 Goal status: INITIAL  2.  Patient will demonstrate improved bilateral knee active knee flex to > 115 deg for improve step ability, don/doff clothes, and ability to get in/out tub. Baseline: EVAL= R-108 deg and L-97 deg Goal status: INITIAL  3.  Pt will decrease 5TSTS by at least 10 seconds in order to demonstrate clinically significant improvement in LE strength. Baseline: EVAL= 30.63 with BUE Support Goal status: INITIAL  4.  Pt will improve BERG by at least 3 points in order to demonstrate clinically significant improvement in balance.   Baseline: EVAL=To be assessed next visit Goal status: INITIAL  5.  Pt will decrease TUG to below  14 seconds/decrease in order to demonstrate decreased fall risk. Baseline: EVAL= 28.1 sec with SPC Goal status: INITIAL  6.  Pt will increase by at least 0.13 m/s in order to demonstrate clinically significant improvement in community ambulation.  Baseline: EVAL= 0.38 m/s with SPC Goal status: INITIAL  ASSESSMENT:  CLINICAL IMPRESSION: Pt is a pleasant year-old female/female referred for abnormality of gait. PT examination reveals deficits in bilateral knee ROM, LE muscle strength, walking, and balance. Will need to further assess balance using BERG or DGI next visit. Pt will benefit from PT services to address deficits in strength, mobility, and pain in order to return to full function at home with less knee pain.  OBJECTIVE IMPAIRMENTS: Abnormal gait, decreased activity tolerance, decreased balance, decreased coordination, decreased endurance, decreased mobility, difficulty walking, decreased ROM, decreased  strength, and hypomobility.   ACTIVITY LIMITATIONS: carrying, lifting, bending, sitting, standing, squatting, stairs, and transfers  PARTICIPATION LIMITATIONS: meal prep, cleaning, laundry, shopping, community activity, and yard work  PERSONAL FACTORS: 1-2 comorbidities: COPD, HTN  are also affecting patient's functional outcome.   REHAB POTENTIAL: Good  CLINICAL DECISION MAKING: Evolving/moderate complexity  EVALUATION COMPLEXITY: Moderate  PLAN:  PT FREQUENCY: 1-2x/week  PT DURATION: 12 weeks  PLANNED INTERVENTIONS: [id number]- PT Re-evaluation, 97110-Therapeutic exercises, 97530- Therapeutic activity, [id number]- Neuromuscular re-education, 97535- Self Care, [id number]- Manual therapy, [id number]- Gait training, [phone]- Orthotic Fit/training, [phone]- Canalith repositioning, [phone]- Electrical stimulation (manual), Patient/Family education, Balance training, Stair training, Taping, Dry Needling, Joint mobilization, Joint manipulation, Scar mobilization, Vestibular training, DME instructions, Cryotherapy, and Moist heat  PLAN FOR NEXT SESSION: 6 min walk, Implement balance, gait, LE strengthening interventions next visit and set up initial HEP for ROM/LE strengthening/Posture/ and balance.   Lenda Kelp, PT 04/29/2023, 8:00 AM

## 2023-05-02 NOTE — Therapy (Signed)
 OUTPATIENT PHYSICAL THERAPY NEURO TREATMENT   Patient Name: Colleen [name] MRN: [id number] DOB:10/24/1945, 78 y.o., female Today's Date: 05/03/2023   PCP: Danelle Berry, PA-C REFERRING PROVIDER: Danelle Berry, PA-C  END OF SESSION:  PT End of Session - 05/03/23 0815     Visit Number 2    Number of Visits 24    Date for PT Re-Evaluation 07/21/23    PT Start Time 0817    PT Stop Time 0902    PT Time Calculation (min) 45 min              Past Medical History:  Diagnosis Date   Asthma    Cataract    COPD (chronic obstructive pulmonary disease) (HCC)    GERD (gastroesophageal reflux disease)    Gout    History of pulmonary embolus (PE)    Hyperlipidemia    Hypertension    Osteoarthrosis    Prediabetes    Past Surgical History:  Procedure Laterality Date   BACK SURGERY     BREAST BIOPSY Left 04/08/2014   intraductal papilloma 5 mm   COLONOSCOPY WITH PROPOFOL N/A 04/23/2020   Procedure: COLONOSCOPY WITH PROPOFOL;  Surgeon: Pasty Spillers, MD;  Location: [facility] ENDOSCOPY;  Service: Endoscopy;  Laterality: N/A;   ESOPHAGOGASTRODUODENOSCOPY (EGD) WITH PROPOFOL N/A 04/23/2020   Procedure: ESOPHAGOGASTRODUODENOSCOPY (EGD) WITH PROPOFOL;  Surgeon: Pasty Spillers, MD;  Location: [facility] ENDOSCOPY;  Service: Endoscopy;  Laterality: N/A;   GIVENS CAPSULE STUDY N/A 01/28/2022   Procedure: GIVENS CAPSULE STUDY;  Surgeon: Wyline Mood, MD;  Location: [facility] ENDOSCOPY;  Service: Gastroenterology;  Laterality: N/A;   LUMBAR LAMINECTOMY     TONSILLECTOMY     Patient Active Problem List   Diagnosis Date Noted   New onset type 2 diabetes mellitus (HCC) 04/04/2023   Acquired trigger finger of right middle finger 07/24/2021   Acquired trigger finger of right ring finger 07/24/2021   Trigger finger of right hand 06/26/2021   Hidradenitis suppurativa 11/12/2020   Status post gastroplasty-1980s 06/25/2020   CLE (columnar lined esophagus)    Hiatal hernia    Gastric nodule     Angiodysplasia of stomach and duodenum    Cecal polyp    Polyp of descending colon    Polyp of transverse colon    Atrial fibrillation (HCC) 06/14/2019   Current use of anticoagulant therapy 06/14/2019   Iron deficiency anemia 06/14/2019   Essential (hemorrhagic) thrombocythemia (HCC) 06/14/2019   GERD (gastroesophageal reflux disease) 04/15/2019   Chronic low back pain 04/15/2019   Right pulmonary embolus (HCC) 04/15/2019   DOE (dyspnea on exertion) 03/13/2019   Atherosclerosis of both carotid arteries 09/20/2018   Spinal stenosis of lumbar region with neurogenic claudication 09/20/2018   Aortic atherosclerosis (HCC) 09/05/2018   At high risk for falls 09/05/2018   Osteopenia 05/18/2017   Stage 3 chronic kidney disease (HCC) 04/21/2017   Morbid obesity (HCC) 04/21/2017   Trigger middle finger of left hand 02/28/2017   Chronic gouty arthropathy without tophi 11/29/2016   Hypergammaglobulinemia 11/29/2016   Osteoarthritis of both knees 11/29/2016   Bilateral primary osteoarthritis of knee 11/29/2016   Allergic rhinitis, seasonal 07/30/2014   Gout 07/30/2014   HLD (hyperlipidemia) 07/30/2014   Osteoarthritis of knee 07/30/2014   Prediabetes 07/30/2014   Asthma, mild persistent 07/30/2014   COPD (chronic obstructive pulmonary disease) (HCC) 07/09/2008   Essential hypertension 06/22/2006    ONSET DATE: 6 months or so  REFERRING DIAG: R26.89 (ICD-10-CM) - Other abnormalities of gait and mobility  THERAPY DIAG:  Abnormality of gait and mobility  Difficulty in walking, not elsewhere classified  Muscle weakness (generalized)  Rationale for Evaluation and Treatment: Rehabilitation  SUBJECTIVE:                                                                                                                                                                                             SUBJECTIVE STATEMENT: Patient reports no pain today. No falls since last session.   Pt  accompanied by: self  PERTINENT HISTORY: Patient is a 78 year old female present today for Eval for gait/mobility due to prolonged B/L knee pain pt was hunching forward using walker (per MD referral). Patient has PMH of  2 Total knee replacement in 2024- most recent left knee in October 2024. States she did receive HHPT but no outpatient PT for either knee.   PAIN:  Are you having pain? No  PRECAUTIONS: Fall  RED FLAGS: None   WEIGHT BEARING RESTRICTIONS: No  FALLS: Has patient fallen in last 6 months? No   LIVING ENVIRONMENT: Lives with: lives alone Lives in: House/apartment Stairs: Yes: External: 4 steps; on right going up Has following equipment at home: Single point cane and Walker - 2 wheeled  PLOF: Independent  PATIENT GOALS: I want to be able to stand up straighter and walk without walker  OBJECTIVE:  Note: Objective measures were completed at Evaluation unless otherwise noted.  DIAGNOSTIC FINDINGS: no recent imaging of knee available in chart.  COGNITION: Overall cognitive status: Within functional limits for tasks assessed   SENSATION: WFL  COORDINATION: WNL  EDEMA:  None observed   POSTURE: rounded shoulders, forward head, and increased thoracic kyphosis  LOWER EXTREMITY ROM:     Active  Right Eval Left Eval  Hip flexion    Hip extension    Hip abduction    Hip adduction    Hip internal rotation    Hip external rotation    Knee flexion 108 97  Knee extension    Ankle dorsiflexion    Ankle plantarflexion    Ankle inversion    Ankle eversion     (Blank rows = not tested)  LOWER EXTREMITY MMT:    MMT Right Eval Left Eval  Hip flexion 4- 4-  Hip extension    Hip abduction 4- 4-  Hip adduction    Hip internal rotation 4- 4  Hip external rotation 4 4  Knee flexion 4 4  Knee extension 4 4  Ankle dorsiflexion 4   Ankle plantarflexion    Ankle inversion    Ankle eversion    (Blank rows = not  tested)  BED MOBILITY:  Patient reports  doing okay  TRANSFERS: Assistive device utilized: Single point cane  Sit to stand: Modified independence Stand to sit: Modified independence Chair to chair: Modified independence Floor:  Not assessed   GAIT: Gait pattern: step to pattern, decreased arm swing- Right, decreased arm swing- Left, decreased step length- Right, decreased step length- Left, decreased stance time- Right, decreased stance time- Left, and decreased stride length Distance walked: < 100 feet Assistive device utilized: Single point cane Level of assistance: SBA Comments: forward trunk posture, minimal step length bilaterally  FUNCTIONAL TESTS:  5 times sit to stand: 30.63 sec with BUE Support Timed up and go (TUG): 28.1 sec with SPC 6 minute walk test: to be assessed visit #2 Berg Balance Scale: to be assessed visit  10 Meter walk test= 25.07 and 27.2 sec with SPC= 0.38 m/s avg PATIENT SURVEYS:  LEFS 39/80                                                                                                                              TREATMENT DATE: 05/03/23   6 Min Walk Test:  Instructed patient to ambulate as quickly and as safely as possible for 6 minutes using LRAD. Patient was allowed to take standing rest breaks without stopping the test, but if the patient required a sitting rest break the clock would be stopped and the test would be over.  Results: 162 feet using a SPC with CGA; stopped at 3 minutes. Results indicate that the patient has reduced endurance with ambulation compared to age matched norms.  Age Matched Norms: 53-69 yo M: 78 F: 43, 48-79 yo M: 40 F: 471, 26-89 yo M: 417 F: 392 MDC: 58.21 meters (190.98 feet) or 50 meters (ANPTA Core Set of Outcome Measures for Adults with Neurologic Conditions, 2018)    [facility] PT Assessment - 05/03/23 0001       Balance   Balance Assessed Yes      Standardized Balance Assessment   Standardized Balance Assessment Berg Balance Test      Berg Balance Test    Sit to Stand Able to stand  independently using hands    Standing Unsupported Able to stand 30 seconds unsupported    Sitting with Back Unsupported but Feet Supported on Floor or Stool Able to sit 2 minutes under supervision    Stand to Sit Controls descent by using hands    Transfers Able to transfer safely, definite need of hands    Standing Unsupported with Eyes Closed Unable to keep eyes closed 3 seconds but stays steady    Standing Unsupported with Feet Together Able to place feet together independently but unable to hold for 30 seconds    From Standing, Reach Forward with Outstretched Arm Reaches forward but needs supervision    From Standing Position, Pick up Object from Floor Able to pick up shoe, needs supervision    From Standing Position, Turn  to Look Behind Over each Shoulder Turn sideways only but maintains balance    Turn 360 Degrees Able to turn 360 degrees safely but slowly    Standing Unsupported, Alternately Place Feet on Step/Stool Able to complete >2 steps/needs minimal assist    Standing Unsupported, One Foot in Front Able to take small step independently and hold 30 seconds    Standing on One Leg Tries to lift leg/unable to hold 3 seconds but remains standing independently    Total Score 29             HEP review: Access Code: [email or web address] URL: [email or web address] Date: 05/03/2023 Prepared by: Precious Bard  Exercises - Standing March with Counter Support  - 1 x daily - 7 x weekly - 2 sets - 10 reps - 5 hold - Seated Long Arc Quad  - 1 x daily - 7 x weekly - 2 sets - 10 reps - 5 hold - Proper Sit to Stand Technique  - 1 x daily - 7 x weekly - 2 sets - 10 reps - 5 hold  Treat:  Neuro Re-ed: Standing with CGA next to support surface:  Airex pad: static stand 30 seconds x 2 trials, noticeable trembling of ankles/LE's with fatigue and challenge to maintain stability Airex pad: horizontal head turns scanning room 10x ; cueing for arc of motion   Airex pad: vertical head turns 30 seconds, cueing for arc of motion, noticeable sway with upward gaze increasing demand on ankle righting reaction musculature  TherEx: Hamstring curl GTB 10x  GTB abduction 15x  Adduction squeeze ball 15x Adduction with LAQ 10x    PATIENT EDUCATION: Education details: Purpose of PT; Discussion of post op knee rehab; purpose of functional outcome measure and goals; Plan of care Person educated: Patient Education method: Explanation Education comprehension: verbalized understanding  HOME EXERCISE PROGRAM: Access Code: [email or web address] URL: [email or web address] Date: 05/03/2023 Prepared by: Precious Bard  Exercises - Standing March with Counter Support  - 1 x daily - 7 x weekly - 2 sets - 10 reps - 5 hold - Seated Long Arc Quad  - 1 x daily - 7 x weekly - 2 sets - 10 reps - 5 hold - Proper Sit to Stand Technique  - 1 x daily - 7 x weekly - 2 sets - 10 reps - 5 hold  GOALS: Goals reviewed with patient? Yes  SHORT TERM GOALS: Target date: 06/09/2023  Pt will be independent with HEP in order to increase strength and improve  function at home and work Baseline: Goal status: INITIAL   LONG TERM GOALS: Target date: 07/21/2023  Pt will increase LEFS by at least 9 points in order to demonstrate significant improvement in lower extremity function. Baseline: EVAL= 39/80 Goal status: INITIAL  2.  Patient will demonstrate improved bilateral knee active knee flex to > 115 deg for improve step ability, don/doff clothes, and ability to get in/out tub. Baseline: EVAL= R-108 deg and L-97 deg Goal status: INITIAL  3.  Pt will decrease 5TSTS by at least 10 seconds in order to demonstrate clinically significant improvement in LE strength. Baseline: EVAL= 30.63 with BUE Support Goal status: INITIAL  4.  Pt will improve BERG by at least 3 points in order to demonstrate clinically significant improvement in balance.   Baseline: EVAL=To be assessed next  visit 4/8: 29 Goal status: INITIAL  5.  Pt will decrease TUG to below 14 seconds/decrease in order to demonstrate decreased fall risk. Baseline: EVAL= 28.1 sec with  SPC Goal status: INITIAL  6.  Pt will increase by at least 0.13 m/s in order to demonstrate clinically significant improvement in community ambulation.  Baseline: EVAL= 0.38 m/s with SPC Goal status: INITIAL  ASSESSMENT:  CLINICAL IMPRESSION: Patient performed 6 minute walk test, terminated at 3 minutes due to fatigue. Her Sharlene Motts demonstrates balance insufficiency. Patient introduced to HEP and demonstrated understanding.  She is challenged on unstable surfaces with instability and poor ankle righting reactions. Pt will benefit from PT services to address deficits in strength, mobility, and pain in order to return to full function at home with less knee pain.  OBJECTIVE IMPAIRMENTS: Abnormal gait, decreased activity tolerance, decreased balance, decreased coordination, decreased endurance, decreased mobility, difficulty walking, decreased ROM, decreased strength, and hypomobility.   ACTIVITY LIMITATIONS: carrying, lifting, bending, sitting, standing, squatting, stairs, and transfers  PARTICIPATION LIMITATIONS: meal prep, cleaning, laundry, shopping, community activity, and yard work  PERSONAL FACTORS: 1-2 comorbidities: COPD, HTN  are also affecting patient's functional outcome.   REHAB POTENTIAL: Good  CLINICAL DECISION MAKING: Evolving/moderate complexity  EVALUATION COMPLEXITY: Moderate  PLAN:  PT FREQUENCY: 1-2x/week  PT DURATION: 12 weeks  PLANNED INTERVENTIONS: 97164- PT Re-evaluation, 97110-Therapeutic exercises, 97530- Therapeutic activity, [id number]- Neuromuscular re-education, 97535- Self Care, 16109- Manual therapy, [id number]- Gait training, [phone]- Orthotic Fit/training, [phone]- Canalith repositioning, [phone]- Electrical stimulation (manual), Patient/Family education, Balance training, Stair training, Taping, Dry  Needling, Joint mobilization, Joint manipulation, Scar mobilization, Vestibular training, DME instructions, Cryotherapy, and Moist heat  PLAN FOR NEXT SESSION: 6 min walk, Implement balance, gait, LE strengthening interventions next visit and set up initial HEP for ROM/LE strengthening/Posture/ and balance.   Precious Bard, PT 05/03/2023, 9:03 AM

## 2023-05-03 ENCOUNTER — Ambulatory Visit

## 2023-05-03 DIAGNOSIS — R269 Unspecified abnormalities of gait and mobility: Secondary | ICD-10-CM | POA: Diagnosis not present

## 2023-05-03 DIAGNOSIS — R262 Difficulty in walking, not elsewhere classified: Secondary | ICD-10-CM

## 2023-05-03 DIAGNOSIS — M6281 Muscle weakness (generalized): Secondary | ICD-10-CM

## 2023-05-06 ENCOUNTER — Ambulatory Visit

## 2023-05-09 NOTE — Therapy (Incomplete)
 OUTPATIENT PHYSICAL THERAPY NEURO TREATMENT   Patient Name: Colleen [name] MRN: [id number] DOB:1945-03-26, 78 y.o., female Today's Date: 05/09/2023   PCP: Adeline Hone, PA-C REFERRING PROVIDER: Adeline Hone, PA-C  END OF SESSION:     Past Medical History:  Diagnosis Date   Asthma    Cataract    COPD (chronic obstructive pulmonary disease) (HCC)    GERD (gastroesophageal reflux disease)    Gout    History of pulmonary embolus (PE)    Hyperlipidemia    Hypertension    Osteoarthrosis    Prediabetes    Past Surgical History:  Procedure Laterality Date   BACK SURGERY     BREAST BIOPSY Left 04/08/2014   intraductal papilloma 5 mm   COLONOSCOPY WITH PROPOFOL N/A 04/23/2020   Procedure: COLONOSCOPY WITH PROPOFOL;  Surgeon: Irby Mannan, MD;  Location: [facility] ENDOSCOPY;  Service: Endoscopy;  Laterality: N/A;   ESOPHAGOGASTRODUODENOSCOPY (EGD) WITH PROPOFOL N/A 04/23/2020   Procedure: ESOPHAGOGASTRODUODENOSCOPY (EGD) WITH PROPOFOL;  Surgeon: Irby Mannan, MD;  Location: [facility] ENDOSCOPY;  Service: Endoscopy;  Laterality: N/A;   GIVENS CAPSULE STUDY N/A 01/28/2022   Procedure: GIVENS CAPSULE STUDY;  Surgeon: Luke Salaam, MD;  Location[facility] ENDOSCOPY;  Service: Gastroenterology;  Laterality: N/A;   LUMBAR LAMINECTOMY     TONSILLECTOMY     Patient Active Problem List   Diagnosis Date Noted   New onset type 2 diabetes mellitus (HCC) 04/04/2023   Acquired trigger finger of right middle finger 07/24/2021   Acquired trigger finger of right ring finger 07/24/2021   Trigger finger of right hand 06/26/2021   Hidradenitis suppurativa 11/12/2020   Status post gastroplasty-1980s 06/25/2020   CLE (columnar lined esophagus)    Hiatal hernia    Gastric nodule    Angiodysplasia of stomach and duodenum    Cecal polyp    Polyp of descending colon    Polyp of transverse colon    Atrial fibrillation (HCC) 06/14/2019   Current use of anticoagulant therapy 06/14/2019   Iron  deficiency anemia 06/14/2019   Essential (hemorrhagic) thrombocythemia (HCC) 06/14/2019   GERD (gastroesophageal reflux disease) 04/15/2019   Chronic low back pain 04/15/2019   Right pulmonary embolus (HCC) 04/15/2019   DOE (dyspnea on exertion) 03/13/2019   Atherosclerosis of both carotid arteries 09/20/2018   Spinal stenosis of lumbar region with neurogenic claudication 09/20/2018   Aortic atherosclerosis (HCC) 09/05/2018   At high risk for falls 09/05/2018   Osteopenia 05/18/2017   Stage 3 chronic kidney disease (HCC) 04/21/2017   Morbid obesity (HCC) 04/21/2017   Trigger middle finger of left hand 02/28/2017   Chronic gouty arthropathy without tophi 11/29/2016   Hypergammaglobulinemia 11/29/2016   Osteoarthritis of both knees 11/29/2016   Bilateral primary osteoarthritis of knee 11/29/2016   Allergic rhinitis, seasonal 07/30/2014   Gout 07/30/2014   HLD (hyperlipidemia) 07/30/2014   Osteoarthritis of knee 07/30/2014   Prediabetes 07/30/2014   Asthma, mild persistent 07/30/2014   COPD (chronic obstructive pulmonary disease) (HCC) 07/09/2008   Essential hypertension 06/22/2006    ONSET DATE: 6 months or so  REFERRING DIAG: R26.89 (ICD-10-CM) - Other abnormalities of gait and mobility  THERAPY DIAG:  No diagnosis found.  Rationale for Evaluation and Treatment: Rehabilitation  SUBJECTIVE:  SUBJECTIVE STATEMENT: ***  Pt accompanied by: self  PERTINENT HISTORY: Patient is a 78 year old female present today for Eval for gait/mobility due to prolonged B/L knee pain pt was hunching forward using walker (per MD referral). Patient has PMH of  2 Total knee replacement in 2024- most recent left knee in October 2024. States she did receive HHPT but no outpatient PT for either knee.   PAIN:  Are  you having pain? No  PRECAUTIONS: Fall  RED FLAGS: None   WEIGHT BEARING RESTRICTIONS: No  FALLS: Has patient fallen in last 6 months? No   LIVING ENVIRONMENT: Lives with: lives alone Lives in: House/apartment Stairs: Yes: External: 4 steps; on right going up Has following equipment at home: Single point cane and Walker - 2 wheeled  PLOF: Independent  PATIENT GOALS: I want to be able to stand up straighter and walk without walker  OBJECTIVE:  Note: Objective measures were completed at Evaluation unless otherwise noted.  DIAGNOSTIC FINDINGS: no recent imaging of knee available in chart.  COGNITION: Overall cognitive status: Within functional limits for tasks assessed   SENSATION: WFL  COORDINATION: WNL  EDEMA:  None observed   POSTURE: rounded shoulders, forward head, and increased thoracic kyphosis  LOWER EXTREMITY ROM:     Active  Right Eval Left Eval  Hip flexion    Hip extension    Hip abduction    Hip adduction    Hip internal rotation    Hip external rotation    Knee flexion 108 97  Knee extension    Ankle dorsiflexion    Ankle plantarflexion    Ankle inversion    Ankle eversion     (Blank rows = not tested)  LOWER EXTREMITY MMT:    MMT Right Eval Left Eval  Hip flexion 4- 4-  Hip extension    Hip abduction 4- 4-  Hip adduction    Hip internal rotation 4- 4  Hip external rotation 4 4  Knee flexion 4 4  Knee extension 4 4  Ankle dorsiflexion 4   Ankle plantarflexion    Ankle inversion    Ankle eversion    (Blank rows = not tested)  BED MOBILITY:  Patient reports doing okay  TRANSFERS: Assistive device utilized: Single point cane  Sit to stand: Modified independence Stand to sit: Modified independence Chair to chair: Modified independence Floor:  Not assessed   GAIT: Gait pattern: step to pattern, decreased arm swing- Right, decreased arm swing- Left, decreased step length- Right, decreased step length- Left, decreased  stance time- Right, decreased stance time- Left, and decreased stride length Distance walked: < 100 feet Assistive device utilized: Single point cane Level of assistance: SBA Comments: forward trunk posture, minimal step length bilaterally  FUNCTIONAL TESTS:  5 times sit to stand: 30.63 sec with BUE Support Timed up and go (TUG): 28.1 sec with SPC 6 minute walk test: to be assessed visit #2 Berg Balance Scale: to be assessed visit  10 Meter walk test= 25.07 and 27.2 sec with SPC= 0.38 m/s avg PATIENT SURVEYS:  LEFS 39/80  TREATMENT DATE: 05/09/23    Neuro Re-ed: Standing with CGA next to support surface:  Airex pad: static stand 30 seconds x 2 trials, noticeable trembling of ankles/LE's with fatigue and challenge to maintain stability Airex pad: horizontal head turns scanning room 10x ; cueing for arc of motion  Airex pad: vertical head turns 30 seconds, cueing for arc of motion, noticeable sway with upward gaze increasing demand on ankle righting reaction musculature  TherEx: Hamstring curl GTB 10x  GTB abduction 15x  Adduction squeeze ball 15x Adduction with LAQ 10x    PATIENT EDUCATION: Education details: Purpose of PT; Discussion of post op knee rehab; purpose of functional outcome measure and goals; Plan of care Person educated: Patient Education method: Explanation Education comprehension: verbalized understanding  HOME EXERCISE PROGRAM: Access Code: [email or web address] URL: [email or web address] Date: 05/03/2023 Prepared by: Precious Bard  Exercises - Standing March with Counter Support  - 1 x daily - 7 x weekly - 2 sets - 10 reps - 5 hold - Seated Long Arc Quad  - 1 x daily - 7 x weekly - 2 sets - 10 reps - 5 hold - Proper Sit to Stand Technique  - 1 x daily - 7 x weekly - 2 sets - 10 reps - 5 hold  GOALS: Goals reviewed with patient?  Yes  SHORT TERM GOALS: Target date: 06/09/2023  Pt will be independent with HEP in order to increase strength and improve  function at home and work Baseline: Goal status: INITIAL   LONG TERM GOALS: Target date: 07/21/2023  Pt will increase LEFS by at least 9 points in order to demonstrate significant improvement in lower extremity function. Baseline: EVAL= 39/80 Goal status: INITIAL  2.  Patient will demonstrate improved bilateral knee active knee flex to > 115 deg for improve step ability, don/doff clothes, and ability to get in/out tub. Baseline: EVAL= R-108 deg and L-97 deg Goal status: INITIAL  3.  Pt will decrease 5TSTS by at least 10 seconds in order to demonstrate clinically significant improvement in LE strength. Baseline: EVAL= 30.63 with BUE Support Goal status: INITIAL  4.  Pt will improve BERG by at least 3 points in order to demonstrate clinically significant improvement in balance.   Baseline: EVAL=To be assessed next visit 4/8: 29 Goal status: INITIAL  5.  Pt will decrease TUG to below 14 seconds/decrease in order to demonstrate decreased fall risk. Baseline: EVAL= 28.1 sec with SPC Goal status: INITIAL  6.  Pt will increase by at least 0.13 m/s in order to demonstrate clinically significant improvement in community ambulation.  Baseline: EVAL= 0.38 m/s with SPC Goal status: INITIAL  ASSESSMENT:  CLINICAL IMPRESSION: *** Pt will benefit from PT services to address deficits in strength, mobility, and pain in order to return to full function at home with less knee pain.  OBJECTIVE IMPAIRMENTS: Abnormal gait, decreased activity tolerance, decreased balance, decreased coordination, decreased endurance, decreased mobility, difficulty walking, decreased ROM, decreased strength, and hypomobility.   ACTIVITY LIMITATIONS: carrying, lifting, bending, sitting, standing, squatting, stairs, and transfers  PARTICIPATION LIMITATIONS: meal prep, cleaning, laundry,  shopping, community activity, and yard work  PERSONAL FACTORS: 1-2 comorbidities: COPD, HTN  are also affecting patient's functional outcome.   REHAB POTENTIAL: Good  CLINICAL DECISION MAKING: Evolving/moderate complexity  EVALUATION COMPLEXITY: Moderate  PLAN:  PT FREQUENCY: 1-2x/week  PT DURATION: 12 weeks  PLANNED INTERVENTIONS: 97164- PT Re-evaluation, 97110-Therapeutic exercises, 97530- Therapeutic activity, 97112- Neuromuscular re-education, 97535- Self Care, 54098- Manual therapy, [id number]- Gait training,  69629- Orthotic Fit/training, [phone]- Canalith repositioning, [id number]- Electrical stimulation (manual), Patient/Family education, Balance training, Stair training, Taping, Dry Needling, Joint mobilization, Joint manipulation, Scar mobilization, Vestibular training, DME instructions, Cryotherapy, and Moist heat  PLAN FOR NEXT SESSION: for ROM/LE strengthening/Posture/ and balance.   Breannah Kratt, PT 05/09/2023, 12:52 PM

## 2023-05-10 ENCOUNTER — Ambulatory Visit: Admitting: Physical Therapy

## 2023-05-10 DIAGNOSIS — R2689 Other abnormalities of gait and mobility: Secondary | ICD-10-CM

## 2023-05-10 DIAGNOSIS — M6281 Muscle weakness (generalized): Secondary | ICD-10-CM

## 2023-05-10 DIAGNOSIS — R2681 Unsteadiness on feet: Secondary | ICD-10-CM

## 2023-05-10 DIAGNOSIS — R269 Unspecified abnormalities of gait and mobility: Secondary | ICD-10-CM | POA: Diagnosis not present

## 2023-05-10 DIAGNOSIS — R262 Difficulty in walking, not elsewhere classified: Secondary | ICD-10-CM

## 2023-05-10 NOTE — Therapy (Signed)
 OUTPATIENT PHYSICAL THERAPY NEURO TREATMENT   Patient Name: Colleen [name] MRN: [id number] DOB:Oct 30, 1945, 78 y.o., female Today's Date: 05/10/2023   PCP: Danelle Berry, PA-C REFERRING PROVIDER: Danelle Berry, PA-C  END OF SESSION:  PT End of Session - 05/10/23 0846     Visit Number 3    Number of Visits 24    Date for PT Re-Evaluation 07/21/23    Progress Note Due on Visit 10    PT Start Time 0843    PT Stop Time 0926    PT Time Calculation (min) 43 min    Equipment Utilized During Treatment Gait belt               Past Medical History:  Diagnosis Date   Asthma    Cataract    COPD (chronic obstructive pulmonary disease) (HCC)    GERD (gastroesophageal reflux disease)    Gout    History of pulmonary embolus (PE)    Hyperlipidemia    Hypertension    Osteoarthrosis    Prediabetes    Past Surgical History:  Procedure Laterality Date   BACK SURGERY     BREAST BIOPSY Left 04/08/2014   intraductal papilloma 5 mm   COLONOSCOPY WITH PROPOFOL N/A 04/23/2020   Procedure: COLONOSCOPY WITH PROPOFOL;  Surgeon: Pasty Spillers, MD;  Location: [facility] ENDOSCOPY;  Service: Endoscopy;  Laterality: N/A;   ESOPHAGOGASTRODUODENOSCOPY (EGD) WITH PROPOFOL N/A 04/23/2020   Procedure: ESOPHAGOGASTRODUODENOSCOPY (EGD) WITH PROPOFOL;  Surgeon: Pasty Spillers, MD;  Location: [facility] ENDOSCOPY;  Service: Endoscopy;  Laterality: N/A;   GIVENS CAPSULE STUDY N/A 01/28/2022   Procedure: GIVENS CAPSULE STUDY;  Surgeon: Wyline Mood, MD;  Location: [facility] ENDOSCOPY;  Service: Gastroenterology;  Laterality: N/A;   LUMBAR LAMINECTOMY     TONSILLECTOMY     Patient Active Problem List   Diagnosis Date Noted   New onset type 2 diabetes mellitus (HCC) 04/04/2023   Acquired trigger finger of right middle finger 07/24/2021   Acquired trigger finger of right ring finger 07/24/2021   Trigger finger of right hand 06/26/2021   Hidradenitis suppurativa 11/12/2020   Status post gastroplasty-1980s  06/25/2020   CLE (columnar lined esophagus)    Hiatal hernia    Gastric nodule    Angiodysplasia of stomach and duodenum    Cecal polyp    Polyp of descending colon    Polyp of transverse colon    Atrial fibrillation (HCC) 06/14/2019   Current use of anticoagulant therapy 06/14/2019   Iron deficiency anemia 06/14/2019   Essential (hemorrhagic) thrombocythemia (HCC) 06/14/2019   GERD (gastroesophageal reflux disease) 04/15/2019   Chronic low back pain 04/15/2019   Right pulmonary embolus (HCC) 04/15/2019   DOE (dyspnea on exertion) 03/13/2019   Atherosclerosis of both carotid arteries 09/20/2018   Spinal stenosis of lumbar region with neurogenic claudication 09/20/2018   Aortic atherosclerosis (HCC) 09/05/2018   At high risk for falls 09/05/2018   Osteopenia 05/18/2017   Stage 3 chronic kidney disease (HCC) 04/21/2017   Morbid obesity (HCC) 04/21/2017   Trigger middle finger of left hand 02/28/2017   Chronic gouty arthropathy without tophi 11/29/2016   Hypergammaglobulinemia 11/29/2016   Osteoarthritis of both knees 11/29/2016   Bilateral primary osteoarthritis of knee 11/29/2016   Allergic rhinitis, seasonal 07/30/2014   Gout 07/30/2014   HLD (hyperlipidemia) 07/30/2014   Osteoarthritis of knee 07/30/2014   Prediabetes 07/30/2014   Asthma, mild persistent 07/30/2014   COPD (chronic obstructive pulmonary disease) (HCC) 07/09/2008   Essential hypertension 06/22/2006  ONSET DATE: 6 months or so  REFERRING DIAG: R26.89 (ICD-10-CM) - Other abnormalities of gait and mobility  THERAPY DIAG:  Abnormality of gait and mobility  Difficulty in walking, not elsewhere classified  Muscle weakness (generalized)  Other abnormalities of gait and mobility  Unsteadiness on feet  Rationale for Evaluation and Treatment: Rehabilitation  SUBJECTIVE:                                                                                                                                                                                              SUBJECTIVE STATEMENT: Patient reports no pain today. No falls since last session. No other changes since last session.   Pt accompanied by: self  PERTINENT HISTORY: Patient is a 78 year old female present today for Eval for gait/mobility due to prolonged B/L knee pain pt was hunching forward using walker (per MD referral). Patient has PMH of  2 Total knee replacement in 2024- most recent left knee in October 2024. States she did receive HHPT but no outpatient PT for either knee.   PAIN:  Are you having pain? No  PRECAUTIONS: Fall  RED FLAGS: None   WEIGHT BEARING RESTRICTIONS: No  FALLS: Has patient fallen in last 6 months? No   LIVING ENVIRONMENT: Lives with: lives alone Lives in: House/apartment Stairs: Yes: External: 4 steps; on right going up Has following equipment at home: Single point cane and Walker - 2 wheeled  PLOF: Independent  PATIENT GOALS: I want to be able to stand up straighter and walk without walker  OBJECTIVE:  Note: Objective measures were completed at Evaluation unless otherwise noted.  DIAGNOSTIC FINDINGS: no recent imaging of knee available in chart.  COGNITION: Overall cognitive status: Within functional limits for tasks assessed   SENSATION: WFL  COORDINATION: WNL  EDEMA:  None observed   POSTURE: rounded shoulders, forward head, and increased thoracic kyphosis  LOWER EXTREMITY ROM:     Active  Right Eval Left Eval  Hip flexion    Hip extension    Hip abduction    Hip adduction    Hip internal rotation    Hip external rotation    Knee flexion 108 97  Knee extension    Ankle dorsiflexion    Ankle plantarflexion    Ankle inversion    Ankle eversion     (Blank rows = not tested)  LOWER EXTREMITY MMT:    MMT Right Eval Left Eval  Hip flexion 4- 4-  Hip extension    Hip abduction 4- 4-  Hip adduction    Hip internal rotation 4- 4  Hip external rotation 4  4  Knee  flexion 4 4  Knee extension 4 4  Ankle dorsiflexion 4   Ankle plantarflexion    Ankle inversion    Ankle eversion    (Blank rows = not tested)  BED MOBILITY:  Patient reports doing okay  TRANSFERS: Assistive device utilized: Single point cane  Sit to stand: Modified independence Stand to sit: Modified independence Chair to chair: Modified independence Floor:  Not assessed   GAIT: Gait pattern: step to pattern, decreased arm swing- Right, decreased arm swing- Left, decreased step length- Right, decreased step length- Left, decreased stance time- Right, decreased stance time- Left, and decreased stride length Distance walked: < 100 feet Assistive device utilized: Single point cane Level of assistance: SBA Comments: forward trunk posture, minimal step length bilaterally  FUNCTIONAL TESTS:  5 times sit to stand: 30.63 sec with BUE Support Timed up and go (TUG): 28.1 sec with SPC 6 minute walk test: to be assessed visit #2 Berg Balance Scale: to be assessed visit  10 Meter walk test= 25.07 and 27.2 sec with SPC= 0.38 m/s avg PATIENT SURVEYS:  LEFS 39/80                                                                                                                              TREATMENT DATE: 05/10/23  TE- To improve strength, endurance, mobility, and function of specific targeted muscle groups or improve joint range of motion or improve muscle flexibility  Nustep level 1 x 2 min   -level 2 x 4 min  Hamstring curl GTB 10x 2 sets  Adduction with LAQ 10x 2 sets with 2.5# AW   Neuro Re-ed: Standing with CGA next to support surface:  NBOS x 45 sec  1/2 romberg x 45 sec ea   TA Standing step tap with UE support  Standing hip ABD x 10 ea  Forward and retro gait with SPC  2 x 20 ft  Side step up x 10 ea with UE assist  Gait with transport chair ( shopping cart like), NBOS and imbalance with turning, will work on in future sessions as indicated.    PATIENT  EDUCATION: Education details: Purpose of PT; Discussion of post op knee rehab; purpose of functional outcome measure and goals; Plan of care Person educated: Patient Education method: Explanation Education comprehension: verbalized understanding  HOME EXERCISE PROGRAM: Access Code: [email or web address] URL: [email or web address] Date: 05/03/2023 Prepared by: Precious Bard  Exercises - Standing March with Counter Support  - 1 x daily - 7 x weekly - 2 sets - 10 reps - 5 hold - Seated Long Arc Quad  - 1 x daily - 7 x weekly - 2 sets - 10 reps - 5 hold - Proper Sit to Stand Technique  - 1 x daily - 7 x weekly - 2 sets - 10 reps - 5 hold  GOALS: Goals reviewed with patient? Yes  SHORT TERM GOALS: Target date: 06/09/2023  Pt will be  independent with HEP in order to increase strength and improve  function at home and work Baseline: Goal status: INITIAL   LONG TERM GOALS: Target date: 07/21/2023  Pt will increase LEFS by at least 9 points in order to demonstrate significant improvement in lower extremity function. Baseline: EVAL= 39/80 Goal status: INITIAL  2.  Patient will demonstrate improved bilateral knee active knee flex to > 115 deg for improve step ability, don/doff clothes, and ability to get in/out tub. Baseline: EVAL= R-108 deg and L-97 deg Goal status: INITIAL  3.  Pt will decrease 5TSTS by at least 10 seconds in order to demonstrate clinically significant improvement in LE strength. Baseline: EVAL= 30.63 with BUE Support Goal status: INITIAL  4.  Pt will improve BERG by at least 3 points in order to demonstrate clinically significant improvement in balance.   Baseline: EVAL=To be assessed next visit 4/8: 29 Goal status: INITIAL  5.  Pt will decrease TUG to below 14 seconds/decrease in order to demonstrate decreased fall risk. Baseline: EVAL= 28.1 sec with SPC Goal status: INITIAL  6.  Pt will increase by at least 0.13 m/s in order to demonstrate clinically  significant improvement in community ambulation.  Baseline: EVAL= 0.38 m/s with SPC Goal status: INITIAL  ASSESSMENT:  CLINICAL IMPRESSION: Continued with current plan of care as laid out in evaluation and recent prior sessions. Pt remains motivated to advance progress toward goals in order to maximize independence and safety at home. Pt requires high level assistance and cuing for completion of exercises in order to provide adequate level of stimulation challenge while minimizing pain and discomfort when possible. Pt closely monitored throughout session pt response and to maximize patient safety during interventions. Pt continues to demonstrate progress toward goals AEB progression of interventions this date either in volume or intensity.   OBJECTIVE IMPAIRMENTS: Abnormal gait, decreased activity tolerance, decreased balance, decreased coordination, decreased endurance, decreased mobility, difficulty walking, decreased ROM, decreased strength, and hypomobility.   ACTIVITY LIMITATIONS: carrying, lifting, bending, sitting, standing, squatting, stairs, and transfers  PARTICIPATION LIMITATIONS: meal prep, cleaning, laundry, shopping, community activity, and yard work  PERSONAL FACTORS: 1-2 comorbidities: COPD, HTN  are also affecting patient's functional outcome.   REHAB POTENTIAL: Good  CLINICAL DECISION MAKING: Evolving/moderate complexity  EVALUATION COMPLEXITY: Moderate  PLAN:  PT FREQUENCY: 1-2x/week  PT DURATION: 12 weeks  PLANNED INTERVENTIONS: 97164- PT Re-evaluation, 97110-Therapeutic exercises, 97530- Therapeutic activity, 97112- Neuromuscular re-education, 97535- Self Care, 16109- Manual therapy, [phone]- Gait training, [phone]- Orthotic Fit/training, [phone]- Canalith repositioning, [phone]- Electrical stimulation (manual), Patient/Family education, Balance training, Stair training, Taping, Dry Needling, Joint mobilization, Joint manipulation, Scar mobilization, Vestibular training,  DME instructions, Cryotherapy, and Moist heat  PLAN FOR NEXT SESSION:  balance, gait, LE strengthening interventions   Edwina Gram, PT 05/10/2023, 8:47 AM

## 2023-05-11 NOTE — Therapy (Signed)
 OUTPATIENT PHYSICAL THERAPY NEURO TREATMENT   Patient Name: Colleen [name] MRN: [id number] DOB:01/22/1946, 78 y.o., female Today's Date: 05/12/2023   PCP: Danelle Berry, PA-C REFERRING PROVIDER: Danelle Berry, PA-C  END OF SESSION:  PT End of Session - 05/12/23 0844     Visit Number 4    Number of Visits 24    Date for PT Re-Evaluation 07/21/23    Progress Note Due on Visit 10    PT Start Time 0845    PT Stop Time 0926    PT Time Calculation (min) 41 min    Equipment Utilized During Treatment Gait belt                Past Medical History:  Diagnosis Date   Asthma    Cataract    COPD (chronic obstructive pulmonary disease) (HCC)    GERD (gastroesophageal reflux disease)    Gout    History of pulmonary embolus (PE)    Hyperlipidemia    Hypertension    Osteoarthrosis    Prediabetes    Past Surgical History:  Procedure Laterality Date   BACK SURGERY     BREAST BIOPSY Left 04/08/2014   intraductal papilloma 5 mm   COLONOSCOPY WITH PROPOFOL N/A 04/23/2020   Procedure: COLONOSCOPY WITH PROPOFOL;  Surgeon: Pasty Spillers, MD;  Location: [facility] ENDOSCOPY;  Service: Endoscopy;  Laterality: N/A;   ESOPHAGOGASTRODUODENOSCOPY (EGD) WITH PROPOFOL N/A 04/23/2020   Procedure: ESOPHAGOGASTRODUODENOSCOPY (EGD) WITH PROPOFOL;  Surgeon: Pasty Spillers, MD;  Location: [facility] ENDOSCOPY;  Service: Endoscopy;  Laterality: N/A;   GIVENS CAPSULE STUDY N/A 01/28/2022   Procedure: GIVENS CAPSULE STUDY;  Surgeon: Wyline Mood, MD;  Location: [facility] ENDOSCOPY;  Service: Gastroenterology;  Laterality: N/A;   LUMBAR LAMINECTOMY     TONSILLECTOMY     Patient Active Problem List   Diagnosis Date Noted   New onset type 2 diabetes mellitus (HCC) 04/04/2023   Acquired trigger finger of right middle finger 07/24/2021   Acquired trigger finger of right ring finger 07/24/2021   Trigger finger of right hand 06/26/2021   Hidradenitis suppurativa 11/12/2020   Status post gastroplasty-1980s  06/25/2020   CLE (columnar lined esophagus)    Hiatal hernia    Gastric nodule    Angiodysplasia of stomach and duodenum    Cecal polyp    Polyp of descending colon    Polyp of transverse colon    Atrial fibrillation (HCC) 06/14/2019   Current use of anticoagulant therapy 06/14/2019   Iron deficiency anemia 06/14/2019   Essential (hemorrhagic) thrombocythemia (HCC) 06/14/2019   GERD (gastroesophageal reflux disease) 04/15/2019   Chronic low back pain 04/15/2019   Right pulmonary embolus (HCC) 04/15/2019   DOE (dyspnea on exertion) 03/13/2019   Atherosclerosis of both carotid arteries 09/20/2018   Spinal stenosis of lumbar region with neurogenic claudication 09/20/2018   Aortic atherosclerosis (HCC) 09/05/2018   At high risk for falls 09/05/2018   Osteopenia 05/18/2017   Stage 3 chronic kidney disease (HCC) 04/21/2017   Morbid obesity (HCC) 04/21/2017   Trigger middle finger of left hand 02/28/2017   Chronic gouty arthropathy without tophi 11/29/2016   Hypergammaglobulinemia 11/29/2016   Osteoarthritis of both knees 11/29/2016   Bilateral primary osteoarthritis of knee 11/29/2016   Allergic rhinitis, seasonal 07/30/2014   Gout 07/30/2014   HLD (hyperlipidemia) 07/30/2014   Osteoarthritis of knee 07/30/2014   Prediabetes 07/30/2014   Asthma, mild persistent 07/30/2014   COPD (chronic obstructive pulmonary disease) (HCC) 07/09/2008   Essential hypertension 06/22/2006  ONSET DATE: 6 months or so  REFERRING DIAG: R26.89 (ICD-10-CM) - Other abnormalities of gait and mobility  THERAPY DIAG:  Abnormality of gait and mobility  Difficulty in walking, not elsewhere classified  Muscle weakness (generalized)  Rationale for Evaluation and Treatment: Rehabilitation  SUBJECTIVE:                                                                                                                                                                                             SUBJECTIVE  STATEMENT: Patient reports she did some people watching over the week. No falls or LOB since last session.  Pt accompanied by: self  PERTINENT HISTORY: Patient is a 78 year old female present today for Eval for gait/mobility due to prolonged B/L knee pain pt was hunching forward using walker (per MD referral). Patient has PMH of  2 Total knee replacement in 2024- most recent left knee in October 2024. States she did receive HHPT but no outpatient PT for either knee.   PAIN:  Are you having pain? No  PRECAUTIONS: Fall  RED FLAGS: None   WEIGHT BEARING RESTRICTIONS: No  FALLS: Has patient fallen in last 6 months? No   LIVING ENVIRONMENT: Lives with: lives alone Lives in: House/apartment Stairs: Yes: External: 4 steps; on right going up Has following equipment at home: Single point cane and Walker - 2 wheeled  PLOF: Independent  PATIENT GOALS: I want to be able to stand up straighter and walk without walker  OBJECTIVE:  Note: Objective measures were completed at Evaluation unless otherwise noted.  DIAGNOSTIC FINDINGS: no recent imaging of knee available in chart.  COGNITION: Overall cognitive status: Within functional limits for tasks assessed   SENSATION: WFL  COORDINATION: WNL  EDEMA:  None observed   POSTURE: rounded shoulders, forward head, and increased thoracic kyphosis  LOWER EXTREMITY ROM:     Active  Right Eval Left Eval  Hip flexion    Hip extension    Hip abduction    Hip adduction    Hip internal rotation    Hip external rotation    Knee flexion 108 97  Knee extension    Ankle dorsiflexion    Ankle plantarflexion    Ankle inversion    Ankle eversion     (Blank rows = not tested)  LOWER EXTREMITY MMT:    MMT Right Eval Left Eval  Hip flexion 4- 4-  Hip extension    Hip abduction 4- 4-  Hip adduction    Hip internal rotation 4- 4  Hip external rotation 4 4  Knee flexion 4 4  Knee extension 4 4  Ankle dorsiflexion 4   Ankle  plantarflexion    Ankle inversion    Ankle eversion    (Blank rows = not tested)  BED MOBILITY:  Patient reports doing okay  TRANSFERS: Assistive device utilized: Single point cane  Sit to stand: Modified independence Stand to sit: Modified independence Chair to chair: Modified independence Floor:  Not assessed   GAIT: Gait pattern: step to pattern, decreased arm swing- Right, decreased arm swing- Left, decreased step length- Right, decreased step length- Left, decreased stance time- Right, decreased stance time- Left, and decreased stride length Distance walked: < 100 feet Assistive device utilized: Single point cane Level of assistance: SBA Comments: forward trunk posture, minimal step length bilaterally  FUNCTIONAL TESTS:  5 times sit to stand: 30.63 sec with BUE Support Timed up and go (TUG): 28.1 sec with SPC 6 minute walk test: to be assessed visit #2 Berg Balance Scale: to be assessed visit  10 Meter walk test= 25.07 and 27.2 sec with SPC= 0.38 m/s avg PATIENT SURVEYS:  LEFS 39/80                                                                                                                              TREATMENT DATE: 05/12/23    Neuro Re-ed: Standing with CGA next to support surface:  Airex pad: static stand 30 seconds x 2 trials, noticeable trembling of ankles/LE's with fatigue and challenge to maintain stability Airex pad: horizontal head turns scanning room 10x ; cueing for arc of motion  Airex pad: vertical head turns 30 seconds, cueing for arc of motion, noticeable sway with upward gaze increasing demand on ankle righting reaction musculature Airex pad: one foot on 6" step one foot on airex pad, hold position for 30 seconds, switch legs, 2x each LE;   TA In // bars: Forward/backwards ambualtion 10x length  Lateral step 10x length of // bars   6" step  -toe taps 10x each LE -step up/down 12x each LE -lateral step up/down 12x each LE   2.5 ankle  weights:  -LAQ 15x each LE  -march 15x each LE -heel raise 15x each LE   PATIENT EDUCATION: Education details: Purpose of PT; Discussion of post op knee rehab; purpose of functional outcome measure and goals; Plan of care Person educated: Patient Education method: Explanation Education comprehension: verbalized understanding  HOME EXERCISE PROGRAM: Access Code: [email or web address] URL: [email or web address] Date: 05/03/2023 Prepared by: Precious Bard  Exercises - Standing March with Counter Support  - 1 x daily - 7 x weekly - 2 sets - 10 reps - 5 hold - Seated Long Arc Quad  - 1 x daily - 7 x weekly - 2 sets - 10 reps - 5 hold - Proper Sit to Stand Technique  - 1 x daily - 7 x weekly - 2 sets - 10 reps - 5 hold  GOALS: Goals reviewed with patient? Yes  SHORT TERM GOALS: Target date: 06/09/2023  Pt  will be independent with HEP in order to increase strength and improve  function at home and work Baseline: Goal status: INITIAL   LONG TERM GOALS: Target date: 07/21/2023  Pt will increase LEFS by at least 9 points in order to demonstrate significant improvement in lower extremity function. Baseline: EVAL= 39/80 Goal status: INITIAL  2.  Patient will demonstrate improved bilateral knee active knee flex to > 115 deg for improve step ability, don/doff clothes, and ability to get in/out tub. Baseline: EVAL= R-108 deg and L-97 deg Goal status: INITIAL  3.  Pt will decrease 5TSTS by at least 10 seconds in order to demonstrate clinically significant improvement in LE strength. Baseline: EVAL= 30.63 with BUE Support Goal status: INITIAL  4.  Pt will improve BERG by at least 3 points in order to demonstrate clinically significant improvement in balance.   Baseline: EVAL=To be assessed next visit 4/8: 29 Goal status: INITIAL  5.  Pt will decrease TUG to below 14 seconds/decrease in order to demonstrate decreased fall risk. Baseline: EVAL= 28.1 sec with SPC Goal status:  INITIAL  6.  Pt will increase by at least 0.13 m/s in order to demonstrate clinically significant improvement in community ambulation.  Baseline: EVAL= 0.38 m/s with SPC Goal status: INITIAL  ASSESSMENT:  CLINICAL IMPRESSION: Patient is highly motivated throughout session. She requires close CGA with unstable surfaces with limited ankle righting reactions. She has anterior weight LOB with airex pad requiring correcting throughout session.  Pt continues to demonstrate progress toward goals AEB progression of interventions this date either in volume or intensity.   OBJECTIVE IMPAIRMENTS: Abnormal gait, decreased activity tolerance, decreased balance, decreased coordination, decreased endurance, decreased mobility, difficulty walking, decreased ROM, decreased strength, and hypomobility.   ACTIVITY LIMITATIONS: carrying, lifting, bending, sitting, standing, squatting, stairs, and transfers  PARTICIPATION LIMITATIONS: meal prep, cleaning, laundry, shopping, community activity, and yard work  PERSONAL FACTORS: 1-2 comorbidities: COPD, HTN  are also affecting patient's functional outcome.   REHAB POTENTIAL: Good  CLINICAL DECISION MAKING: Evolving/moderate complexity  EVALUATION COMPLEXITY: Moderate  PLAN:  PT FREQUENCY: 1-2x/week  PT DURATION: 12 weeks  PLANNED INTERVENTIONS: 97164- PT Re-evaluation, 97110-Therapeutic exercises, 97530- Therapeutic activity, 97112- Neuromuscular re-education, 97535- Self Care, 16109- Manual therapy, [phone]- Gait training, [phone]- Orthotic Fit/training, [phone]- Canalith repositioning, [phone]- Electrical stimulation (manual), Patient/Family education, Balance training, Stair training, Taping, Dry Needling, Joint mobilization, Joint manipulation, Scar mobilization, Vestibular training, DME instructions, Cryotherapy, and Moist heat  PLAN FOR NEXT SESSION:  balance, gait, LE strengthening interventions   Cassey Bacigalupo, PT 05/12/2023, 9:26 AM

## 2023-05-12 ENCOUNTER — Ambulatory Visit

## 2023-05-12 DIAGNOSIS — R262 Difficulty in walking, not elsewhere classified: Secondary | ICD-10-CM

## 2023-05-12 DIAGNOSIS — M6281 Muscle weakness (generalized): Secondary | ICD-10-CM

## 2023-05-12 DIAGNOSIS — R269 Unspecified abnormalities of gait and mobility: Secondary | ICD-10-CM

## 2023-05-16 ENCOUNTER — Ambulatory Visit

## 2023-05-16 DIAGNOSIS — R269 Unspecified abnormalities of gait and mobility: Secondary | ICD-10-CM

## 2023-05-16 DIAGNOSIS — R262 Difficulty in walking, not elsewhere classified: Secondary | ICD-10-CM

## 2023-05-16 DIAGNOSIS — M6281 Muscle weakness (generalized): Secondary | ICD-10-CM

## 2023-05-16 NOTE — Therapy (Signed)
 OUTPATIENT PHYSICAL THERAPY NEURO TREATMENT   Patient Name: Colleen [name] MRN: [id number] DOB:17-Jul-1945, 78 y.o., female Today's Date: 05/16/2023   PCP: Adeline Hone, PA-C REFERRING PROVIDER: Adeline Hone, PA-C  END OF SESSION:  PT End of Session - 05/16/23 0912     Visit Number 5    Number of Visits 24    Date for PT Re-Evaluation 07/21/23    Progress Note Due on Visit 10    PT Start Time 0930    PT Stop Time 1014    PT Time Calculation (min) 44 min    Equipment Utilized During Treatment Gait belt                 Past Medical History:  Diagnosis Date   Asthma    Cataract    COPD (chronic obstructive pulmonary disease) (HCC)    GERD (gastroesophageal reflux disease)    Gout    History of pulmonary embolus (PE)    Hyperlipidemia    Hypertension    Osteoarthrosis    Prediabetes    Past Surgical History:  Procedure Laterality Date   BACK SURGERY     BREAST BIOPSY Left 04/08/2014   intraductal papilloma 5 mm   COLONOSCOPY WITH PROPOFOL  N/A 04/23/2020   Procedure: COLONOSCOPY WITH PROPOFOL ;  Surgeon: Irby Mannan, MD;  Location: [facility] ENDOSCOPY;  Service: Endoscopy;  Laterality: N/A;   ESOPHAGOGASTRODUODENOSCOPY (EGD) WITH PROPOFOL  N/A 04/23/2020   Procedure: ESOPHAGOGASTRODUODENOSCOPY (EGD) WITH PROPOFOL ;  Surgeon: Irby Mannan, MD;  Location: [facility] ENDOSCOPY;  Service: Endoscopy;  Laterality: N/A;   GIVENS CAPSULE STUDY N/A 01/28/2022   Procedure: GIVENS CAPSULE STUDY;  Surgeon: Luke Salaam, MD;  Location: [facility] ENDOSCOPY;  Service: Gastroenterology;  Laterality: N/A;   LUMBAR LAMINECTOMY     TONSILLECTOMY     Patient Active Problem List   Diagnosis Date Noted   New onset type 2 diabetes mellitus (HCC) 04/04/2023   Acquired trigger finger of right middle finger 07/24/2021   Acquired trigger finger of right ring finger 07/24/2021   Trigger finger of right hand 06/26/2021   Hidradenitis suppurativa 11/12/2020   Status post gastroplasty-1980s  06/25/2020   CLE (columnar lined esophagus)    Hiatal hernia    Gastric nodule    Angiodysplasia of stomach and duodenum    Cecal polyp    Polyp of descending colon    Polyp of transverse colon    Atrial fibrillation (HCC) 06/14/2019   Current use of anticoagulant therapy 06/14/2019   Iron deficiency anemia 06/14/2019   Essential (hemorrhagic) thrombocythemia (HCC) 06/14/2019   GERD (gastroesophageal reflux disease) 04/15/2019   Chronic low back pain 04/15/2019   Right pulmonary embolus (HCC) 04/15/2019   DOE (dyspnea on exertion) 03/13/2019   Atherosclerosis of both carotid arteries 09/20/2018   Spinal stenosis of lumbar region with neurogenic claudication 09/20/2018   Aortic atherosclerosis (HCC) 09/05/2018   At high risk for falls 09/05/2018   Osteopenia 05/18/2017   Stage 3 chronic kidney disease (HCC) 04/21/2017   Morbid obesity (HCC) 04/21/2017   Trigger middle finger of left hand 02/28/2017   Chronic gouty arthropathy without tophi 11/29/2016   Hypergammaglobulinemia 11/29/2016   Osteoarthritis of both knees 11/29/2016   Bilateral primary osteoarthritis of knee 11/29/2016   Allergic rhinitis, seasonal 07/30/2014   Gout 07/30/2014   HLD (hyperlipidemia) 07/30/2014   Osteoarthritis of knee 07/30/2014   Prediabetes 07/30/2014   Asthma, mild persistent 07/30/2014   COPD (chronic obstructive pulmonary disease) (HCC) 07/09/2008   Essential hypertension 06/22/2006  ONSET DATE: 6 months or so  REFERRING DIAG: R26.89 (ICD-10-CM) - Other abnormalities of gait and mobility  THERAPY DIAG:  Abnormality of gait and mobility  Difficulty in walking, not elsewhere classified  Muscle weakness (generalized)  Rationale for Evaluation and Treatment: Rehabilitation  SUBJECTIVE:                                                                                                                                                                                             SUBJECTIVE  STATEMENT: Patient reports she cooked for over 10 people for easter. Came at wrong time.   Pt accompanied by: self  PERTINENT HISTORY: Patient is a 78 year old female present today for Eval for gait/mobility due to prolonged B/L knee pain pt was hunching forward using walker (per MD referral). Patient has PMH of  2 Total knee replacement in 2024- most recent left knee in October 2024. States she did receive HHPT but no outpatient PT for either knee.   PAIN:  Are you having pain? No  PRECAUTIONS: Fall  RED FLAGS: None   WEIGHT BEARING RESTRICTIONS: No  FALLS: Has patient fallen in last 6 months? No   LIVING ENVIRONMENT: Lives with: lives alone Lives in: House/apartment Stairs: Yes: External: 4 steps; on right going up Has following equipment at home: Single point cane and Walker - 2 wheeled  PLOF: Independent  PATIENT GOALS: I want to be able to stand up straighter and walk without walker  OBJECTIVE:  Note: Objective measures were completed at Evaluation unless otherwise noted.  DIAGNOSTIC FINDINGS: no recent imaging of knee available in chart.  COGNITION: Overall cognitive status: Within functional limits for tasks assessed   SENSATION: WFL  COORDINATION: WNL  EDEMA:  None observed   POSTURE: rounded shoulders, forward head, and increased thoracic kyphosis  LOWER EXTREMITY ROM:     Active  Right Eval Left Eval  Hip flexion    Hip extension    Hip abduction    Hip adduction    Hip internal rotation    Hip external rotation    Knee flexion 108 97  Knee extension    Ankle dorsiflexion    Ankle plantarflexion    Ankle inversion    Ankle eversion     (Blank rows = not tested)  LOWER EXTREMITY MMT:    MMT Right Eval Left Eval  Hip flexion 4- 4-  Hip extension    Hip abduction 4- 4-  Hip adduction    Hip internal rotation 4- 4  Hip external rotation 4 4  Knee flexion 4 4  Knee extension 4 4  Ankle  dorsiflexion 4   Ankle plantarflexion     Ankle inversion    Ankle eversion    (Blank rows = not tested)  BED MOBILITY:  Patient reports doing okay  TRANSFERS: Assistive device utilized: Single point cane  Sit to stand: Modified independence Stand to sit: Modified independence Chair to chair: Modified independence Floor:  Not assessed   GAIT: Gait pattern: step to pattern, decreased arm swing- Right, decreased arm swing- Left, decreased step length- Right, decreased step length- Left, decreased stance time- Right, decreased stance time- Left, and decreased stride length Distance walked: < 100 feet Assistive device utilized: Single point cane Level of assistance: SBA Comments: forward trunk posture, minimal step length bilaterally  FUNCTIONAL TESTS:  5 times sit to stand: 30.63 sec with BUE Support Timed up and go (TUG): 28.1 sec with SPC 6 minute walk test: to be assessed visit #2 Berg Balance Scale: to be assessed visit  10 Meter walk test= 25.07 and 27.2 sec with SPC= 0.38 m/s avg PATIENT SURVEYS:  LEFS 39/80                                                                                                                              TREATMENT DATE: 05/16/23    Neuro Re-ed: Standing with CGA next to support surface:  Airex pad: static stand 30 seconds x 2 trials, noticeable trembling of ankles/LE's with fatigue and challenge to maintain stability Airex pad: horizontal head turns scanning room 10x ; cueing for arc of motion  Airex pad: vertical head turns 30 seconds, cueing for arc of motion, noticeable sway with upward gaze increasing demand on ankle righting reaction musculature Airex pad: one foot on 6" step one foot on airex pad, hold position for 30 seconds, switch legs, 2x each LE;   TA  Lateral step over half foam roller 10x each LE Ambulate with hurrycane 150 ft ; close CGA; increased L foot inversion noted with fatigue.  Sit to stand 10x Standing RTB row 15x  Adduction seated squeezes 15x   Adduction with LAQ 15x seated   3 ankle weights:  -standing hip flexion 10x each LE -standing hip extension 10x each LE -standing hip abduction 10x each LE -LAQ 15x each LE  -march 15x each LE -heel raise 15x each LE   PATIENT EDUCATION: Education details: Purpose of PT; Discussion of post op knee rehab; purpose of functional outcome measure and goals; Plan of care Person educated: Patient Education method: Explanation Education comprehension: verbalized understanding  HOME EXERCISE PROGRAM: Access Code: [email or web address] URL: [email or web address] Date: 05/03/2023 Prepared by: Othniel Maret  Exercises - Standing March with Counter Support  - 1 x daily - 7 x weekly - 2 sets - 10 reps - 5 hold - Seated Long Arc Quad  - 1 x daily - 7 x weekly - 2 sets - 10 reps - 5 hold - Proper Sit to Stand Technique  - 1 x daily - 7 x  weekly - 2 sets - 10 reps - 5 hold  GOALS: Goals reviewed with patient? Yes  SHORT TERM GOALS: Target date: 06/09/2023  Pt will be independent with HEP in order to increase strength and improve  function at home and work Baseline: Goal status: INITIAL   LONG TERM GOALS: Target date: 07/21/2023  Pt will increase LEFS by at least 9 points in order to demonstrate significant improvement in lower extremity function. Baseline: EVAL= 39/80 Goal status: INITIAL  2.  Patient will demonstrate improved bilateral knee active knee flex to > 115 deg for improve step ability, don/doff clothes, and ability to get in/out tub. Baseline: EVAL= R-108 deg and L-97 deg Goal status: INITIAL  3.  Pt will decrease 5TSTS by at least 10 seconds in order to demonstrate clinically significant improvement in LE strength. Baseline: EVAL= 30.63 with BUE Support Goal status: INITIAL  4.  Pt will improve BERG by at least 3 points in order to demonstrate clinically significant improvement in balance.   Baseline: EVAL=To be assessed next visit 4/8: 29 Goal status: INITIAL  5.   Pt will decrease TUG to below 14 seconds/decrease in order to demonstrate decreased fall risk. Baseline: EVAL= 28.1 sec with SPC Goal status: INITIAL  6.  Pt will increase by at least 0.13 m/s in order to demonstrate clinically significant improvement in community ambulation.  Baseline: EVAL= 0.38 m/s with SPC Goal status: INITIAL  ASSESSMENT:  CLINICAL IMPRESSION: Patient is highly motivated throughout session despite arriving at wrong tie. Standing strengthening tolerated however patient continues to have extreme trunk flexion in standing position. She is able to self correct partially with cueing.   Pt continues to demonstrate progress toward goals AEB progression of interventions this date either in volume or intensity.   OBJECTIVE IMPAIRMENTS: Abnormal gait, decreased activity tolerance, decreased balance, decreased coordination, decreased endurance, decreased mobility, difficulty walking, decreased ROM, decreased strength, and hypomobility.   ACTIVITY LIMITATIONS: carrying, lifting, bending, sitting, standing, squatting, stairs, and transfers  PARTICIPATION LIMITATIONS: meal prep, cleaning, laundry, shopping, community activity, and yard work  PERSONAL FACTORS: 1-2 comorbidities: COPD, HTN  are also affecting patient's functional outcome.   REHAB POTENTIAL: Good  CLINICAL DECISION MAKING: Evolving/moderate complexity  EVALUATION COMPLEXITY: Moderate  PLAN:  PT FREQUENCY: 1-2x/week  PT DURATION: 12 weeks  PLANNED INTERVENTIONS: 97164- PT Re-evaluation, 97110-Therapeutic exercises, 97530- Therapeutic activity, 97112- Neuromuscular re-education, 97535- Self Care, 82956- Manual therapy, [phone]- Gait training, [phone]- Orthotic Fit/training, [phone]- Canalith repositioning, [phone]- Electrical stimulation (manual), Patient/Family education, Balance training, Stair training, Taping, Dry Needling, Joint mobilization, Joint manipulation, Scar mobilization, Vestibular training, DME  instructions, Cryotherapy, and Moist heat  PLAN FOR NEXT SESSION:  balance, gait, LE strengthening interventions   Jaecion Dempster, PT 05/16/2023, 10:18 AM

## 2023-05-17 NOTE — Therapy (Signed)
 OUTPATIENT PHYSICAL THERAPY NEURO TREATMENT   Patient Name: Colleen [name] MRN: [id number] DOB:1945-12-09, 78 y.o., female Today's Date: 05/18/2023   PCP: Adeline Hone, PA-C REFERRING PROVIDER: Adeline Hone, PA-C  END OF SESSION:  PT End of Session - 05/18/23 0840     Visit Number 6    Number of Visits 24    Date for PT Re-Evaluation 07/21/23    Progress Note Due on Visit 10    PT Start Time 0845    PT Stop Time 0929    PT Time Calculation (min) 44 min    Equipment Utilized During Treatment Gait belt                  Past Medical History:  Diagnosis Date   Asthma    Cataract    COPD (chronic obstructive pulmonary disease) (HCC)    GERD (gastroesophageal reflux disease)    Gout    History of pulmonary embolus (PE)    Hyperlipidemia    Hypertension    Osteoarthrosis    Prediabetes    Past Surgical History:  Procedure Laterality Date   BACK SURGERY     BREAST BIOPSY Left 04/08/2014   intraductal papilloma 5 mm   COLONOSCOPY WITH PROPOFOL  N/A 04/23/2020   Procedure: COLONOSCOPY WITH PROPOFOL ;  Surgeon: Irby Mannan, MD;  Location: [facility] ENDOSCOPY;  Service: Endoscopy;  Laterality: N/A;   ESOPHAGOGASTRODUODENOSCOPY (EGD) WITH PROPOFOL  N/A 04/23/2020   Procedure: ESOPHAGOGASTRODUODENOSCOPY (EGD) WITH PROPOFOL ;  Surgeon: Irby Mannan, MD;  Location: [facility] ENDOSCOPY;  Service: Endoscopy;  Laterality: N/A;   GIVENS CAPSULE STUDY N/A 01/28/2022   Procedure: GIVENS CAPSULE STUDY;  Surgeon: Luke Salaam, MD;  Location: [facility] ENDOSCOPY;  Service: Gastroenterology;  Laterality: N/A;   LUMBAR LAMINECTOMY     TONSILLECTOMY     Patient Active Problem List   Diagnosis Date Noted   New onset type 2 diabetes mellitus (HCC) 04/04/2023   Acquired trigger finger of right middle finger 07/24/2021   Acquired trigger finger of right ring finger 07/24/2021   Trigger finger of right hand 06/26/2021   Hidradenitis suppurativa 11/12/2020   Status post gastroplasty-1980s  06/25/2020   CLE (columnar lined esophagus)    Hiatal hernia    Gastric nodule    Angiodysplasia of stomach and duodenum    Cecal polyp    Polyp of descending colon    Polyp of transverse colon    Atrial fibrillation (HCC) 06/14/2019   Current use of anticoagulant therapy 06/14/2019   Iron deficiency anemia 06/14/2019   Essential (hemorrhagic) thrombocythemia (HCC) 06/14/2019   GERD (gastroesophageal reflux disease) 04/15/2019   Chronic low back pain 04/15/2019   Right pulmonary embolus (HCC) 04/15/2019   DOE (dyspnea on exertion) 03/13/2019   Atherosclerosis of both carotid arteries 09/20/2018   Spinal stenosis of lumbar region with neurogenic claudication 09/20/2018   Aortic atherosclerosis (HCC) 09/05/2018   At high risk for falls 09/05/2018   Osteopenia 05/18/2017   Stage 3 chronic kidney disease (HCC) 04/21/2017   Morbid obesity (HCC) 04/21/2017   Trigger middle finger of left hand 02/28/2017   Chronic gouty arthropathy without tophi 11/29/2016   Hypergammaglobulinemia 11/29/2016   Osteoarthritis of both knees 11/29/2016   Bilateral primary osteoarthritis of knee 11/29/2016   Allergic rhinitis, seasonal 07/30/2014   Gout 07/30/2014   HLD (hyperlipidemia) 07/30/2014   Osteoarthritis of knee 07/30/2014   Prediabetes 07/30/2014   Asthma, mild persistent 07/30/2014   COPD (chronic obstructive pulmonary disease) (HCC) 07/09/2008   Essential hypertension  06/22/2006    ONSET DATE: 6 months or so  REFERRING DIAG: R26.89 (ICD-10-CM) - Other abnormalities of gait and mobility  THERAPY DIAG:  Abnormality of gait and mobility  Difficulty in walking, not elsewhere classified  Muscle weakness (generalized)  Other abnormalities of gait and mobility  Rationale for Evaluation and Treatment: Rehabilitation  SUBJECTIVE:                                                                                                                                                                                              SUBJECTIVE STATEMENT: Patient reports no aches or pains, just her stomach issues.   Pt accompanied by: self  PERTINENT HISTORY: Patient is a 78 year old female present today for Eval for gait/mobility due to prolonged B/L knee pain pt was hunching forward using walker (per MD referral). Patient has PMH of  2 Total knee replacement in 2024- most recent left knee in October 2024. States she did receive HHPT but no outpatient PT for either knee.   PAIN:  Are you having pain? No  PRECAUTIONS: Fall  RED FLAGS: None   WEIGHT BEARING RESTRICTIONS: No  FALLS: Has patient fallen in last 6 months? No   LIVING ENVIRONMENT: Lives with: lives alone Lives in: House/apartment Stairs: Yes: External: 4 steps; on right going up Has following equipment at home: Single point cane and Walker - 2 wheeled  PLOF: Independent  PATIENT GOALS: I want to be able to stand up straighter and walk without walker  OBJECTIVE:  Note: Objective measures were completed at Evaluation unless otherwise noted.  DIAGNOSTIC FINDINGS: no recent imaging of knee available in chart.  COGNITION: Overall cognitive status: Within functional limits for tasks assessed   SENSATION: WFL  COORDINATION: WNL  EDEMA:  None observed   POSTURE: rounded shoulders, forward head, and increased thoracic kyphosis  LOWER EXTREMITY ROM:     Active  Right Eval Left Eval  Hip flexion    Hip extension    Hip abduction    Hip adduction    Hip internal rotation    Hip external rotation    Knee flexion 108 97  Knee extension    Ankle dorsiflexion    Ankle plantarflexion    Ankle inversion    Ankle eversion     (Blank rows = not tested)  LOWER EXTREMITY MMT:    MMT Right Eval Left Eval  Hip flexion 4- 4-  Hip extension    Hip abduction 4- 4-  Hip adduction    Hip internal rotation 4- 4  Hip external rotation 4 4  Knee flexion 4 4  Knee extension 4 4  Ankle dorsiflexion 4   Ankle  plantarflexion    Ankle inversion    Ankle eversion    (Blank rows = not tested)  BED MOBILITY:  Patient reports doing okay  TRANSFERS: Assistive device utilized: Single point cane  Sit to stand: Modified independence Stand to sit: Modified independence Chair to chair: Modified independence Floor:  Not assessed   GAIT: Gait pattern: step to pattern, decreased arm swing- Right, decreased arm swing- Left, decreased step length- Right, decreased step length- Left, decreased stance time- Right, decreased stance time- Left, and decreased stride length Distance walked: < 100 feet Assistive device utilized: Single point cane Level of assistance: SBA Comments: forward trunk posture, minimal step length bilaterally  FUNCTIONAL TESTS:  5 times sit to stand: 30.63 sec with BUE Support Timed up and go (TUG): 28.1 sec with SPC 6 minute walk test: to be assessed visit #2 Berg Balance Scale: to be assessed visit  10 Meter walk test= 25.07 and 27.2 sec with SPC= 0.38 m/s avg PATIENT SURVEYS:  LEFS 39/80                                                                                                                              TREATMENT DATE: 05/18/23    Neuro Re-ed: Standing with CGA next to support surface:  Airex balance beam: -lateral step 6x length of // bars -tandem walk 6x length of // bars -PVC pipe row 10x, overhead press 10x;   TA Step over theraband and clap 10x each LE; very challenging for patient for not holding on Speed ladder: -one foot per square with decreasing UE support x  multiple reps -lateral step two feet per square x multiple reps   Lateral step over half foam roller 10x each LE  Sit to stand 10x   4 ankle weights:  -high knee march 4x length of // bars -lateral step 4x length of // bars -standing hip extension 10x each LE -LAQ 15x each LE  -march 15x each LE -heel raise 15x each LE   PATIENT EDUCATION: Education details: Purpose of PT;  Discussion of post op knee rehab; purpose of functional outcome measure and goals; Plan of care Person educated: Patient Education method: Explanation Education comprehension: verbalized understanding  HOME EXERCISE PROGRAM: Access Code: [email or web address] URL: [email or web address] Date: 05/03/2023 Prepared by: Dahlila Pfahler  Exercises - Standing March with Counter Support  - 1 x daily - 7 x weekly - 2 sets - 10 reps - 5 hold - Seated Long Arc Quad  - 1 x daily - 7 x weekly - 2 sets - 10 reps - 5 hold - Proper Sit to Stand Technique  - 1 x daily - 7 x weekly - 2 sets - 10 reps - 5 hold  GOALS: Goals reviewed with patient? Yes  SHORT TERM GOALS: Target date: 06/09/2023  Pt will be independent with HEP in order to increase strength and improve  function at home and work Baseline: Goal status: INITIAL   LONG TERM GOALS: Target date: 07/21/2023  Pt will increase LEFS by at least 9 points in order to demonstrate significant improvement in lower extremity function. Baseline: EVAL= 39/80 Goal status: INITIAL  2.  Patient will demonstrate improved bilateral knee active knee flex to > 115 deg for improve step ability, don/doff clothes, and ability to get in/out tub. Baseline: EVAL= R-108 deg and L-97 deg Goal status: INITIAL  3.  Pt will decrease 5TSTS by at least 10 seconds in order to demonstrate clinically significant improvement in LE strength. Baseline: EVAL= 30.63 with BUE Support Goal status: INITIAL  4.  Pt will improve BERG by at least 3 points in order to demonstrate clinically significant improvement in balance.   Baseline: EVAL=To be assessed next visit 4/8: 29 Goal status: INITIAL  5.  Pt will decrease TUG to below 14 seconds/decrease in order to demonstrate decreased fall risk. Baseline: EVAL= 28.1 sec with SPC Goal status: INITIAL  6.  Pt will increase by at least 0.13 m/s in order to demonstrate clinically significant improvement in community ambulation.   Baseline: EVAL= 0.38 m/s with SPC Goal status: INITIAL  ASSESSMENT:  CLINICAL IMPRESSION: Patient presents with excellent motivation. She is challenged with larger steps requiring visual cues of band and speed ladder.   She is improving in strength with 4lb ankle weights tolerated this session. Decreased trunk flexion noted by end of session indicating carryover between interventions.  Pt continues to demonstrate progress toward goals AEB progression of interventions this date either in volume or intensity.   OBJECTIVE IMPAIRMENTS: Abnormal gait, decreased activity tolerance, decreased balance, decreased coordination, decreased endurance, decreased mobility, difficulty walking, decreased ROM, decreased strength, and hypomobility.   ACTIVITY LIMITATIONS: carrying, lifting, bending, sitting, standing, squatting, stairs, and transfers  PARTICIPATION LIMITATIONS: meal prep, cleaning, laundry, shopping, community activity, and yard work  PERSONAL FACTORS: 1-2 comorbidities: COPD, HTN  are also affecting patient's functional outcome.   REHAB POTENTIAL: Good  CLINICAL DECISION MAKING: Evolving/moderate complexity  EVALUATION COMPLEXITY: Moderate  PLAN:  PT FREQUENCY: 1-2x/week  PT DURATION: 12 weeks  PLANNED INTERVENTIONS: 97164- PT Re-evaluation, 97110-Therapeutic exercises, 97530- Therapeutic activity, 97112- Neuromuscular re-education, 97535- Self Care, 09811- Manual therapy, [phone]- Gait training, [phone]- Orthotic Fit/training, [phone]- Canalith repositioning, [phone]- Electrical stimulation (manual), Patient/Family education, Balance training, Stair training, Taping, Dry Needling, Joint mobilization, Joint manipulation, Scar mobilization, Vestibular training, DME instructions, Cryotherapy, and Moist heat  PLAN FOR NEXT SESSION:  balance, gait, LE strengthening interventions   Elleanor Guyett, PT 05/18/2023, 9:31 AM

## 2023-05-18 ENCOUNTER — Ambulatory Visit

## 2023-05-18 DIAGNOSIS — R2689 Other abnormalities of gait and mobility: Secondary | ICD-10-CM

## 2023-05-18 DIAGNOSIS — R269 Unspecified abnormalities of gait and mobility: Secondary | ICD-10-CM | POA: Diagnosis not present

## 2023-05-18 DIAGNOSIS — R262 Difficulty in walking, not elsewhere classified: Secondary | ICD-10-CM

## 2023-05-18 DIAGNOSIS — M6281 Muscle weakness (generalized): Secondary | ICD-10-CM

## 2023-05-23 ENCOUNTER — Ambulatory Visit

## 2023-05-23 DIAGNOSIS — R2689 Other abnormalities of gait and mobility: Secondary | ICD-10-CM

## 2023-05-23 DIAGNOSIS — R262 Difficulty in walking, not elsewhere classified: Secondary | ICD-10-CM

## 2023-05-23 DIAGNOSIS — M6281 Muscle weakness (generalized): Secondary | ICD-10-CM

## 2023-05-23 DIAGNOSIS — R269 Unspecified abnormalities of gait and mobility: Secondary | ICD-10-CM | POA: Diagnosis not present

## 2023-05-23 DIAGNOSIS — R278 Other lack of coordination: Secondary | ICD-10-CM

## 2023-05-23 DIAGNOSIS — R2681 Unsteadiness on feet: Secondary | ICD-10-CM

## 2023-05-23 NOTE — Therapy (Signed)
 OUTPATIENT PHYSICAL THERAPY NEURO TREATMENT   Patient Name: Colleen [name] MRN: [id number] DOB:[birth year], 78 y.o., female Today's Date: 05/23/2023   PCP: Adeline Hone, PA-C REFERRING PROVIDER: Adeline Hone, PA-C  END OF SESSION:  PT End of Session - 05/23/23 0852     Visit Number 7    Number of Visits 24    Date for PT Re-Evaluation 07/21/23    Progress Note Due on Visit 10    PT Start Time 0849    PT Stop Time 0929    PT Time Calculation (min) 40 min    Equipment Utilized During Treatment Gait belt    Activity Tolerance Patient tolerated treatment well;No increased pain    Behavior During Therapy WFL for tasks assessed/performed                  Past Medical History:  Diagnosis Date   Asthma    Cataract    COPD (chronic obstructive pulmonary disease) (HCC)    GERD (gastroesophageal reflux disease)    Gout    History of pulmonary embolus (PE)    Hyperlipidemia    Hypertension    Osteoarthrosis    Prediabetes    Past Surgical History:  Procedure Laterality Date   BACK SURGERY     BREAST BIOPSY Left 04/08/2014   intraductal papilloma 5 mm   COLONOSCOPY WITH PROPOFOL  N/A 04/23/2020   Procedure: COLONOSCOPY WITH PROPOFOL ;  Surgeon: Irby Mannan, MD;  Location: [facility] ENDOSCOPY;  Service: Endoscopy;  Laterality: N/A;   ESOPHAGOGASTRODUODENOSCOPY (EGD) WITH PROPOFOL  N/A 04/23/2020   Procedure: ESOPHAGOGASTRODUODENOSCOPY (EGD) WITH PROPOFOL ;  Surgeon: Irby Mannan, MD;  Location: [facility] ENDOSCOPY;  Service: Endoscopy;  Laterality: N/A;   GIVENS CAPSULE STUDY N/A 01/28/2022   Procedure: GIVENS CAPSULE STUDY;  Surgeon: Luke Salaam, MD;  Location: [facility] ENDOSCOPY;  Service: Gastroenterology;  Laterality: N/A;   LUMBAR LAMINECTOMY     TONSILLECTOMY     Patient Active Problem List   Diagnosis Date Noted   New onset type 2 diabetes mellitus (HCC) 04/04/2023   Acquired trigger finger of right middle finger 07/24/2021   Acquired trigger finger of right ring  finger 07/24/2021   Trigger finger of right hand 06/26/2021   Hidradenitis suppurativa 11/12/2020   Status post gastroplasty-1980s 06/25/2020   CLE (columnar lined esophagus)    Hiatal hernia    Gastric nodule    Angiodysplasia of stomach and duodenum    Cecal polyp    Polyp of descending colon    Polyp of transverse colon    Atrial fibrillation (HCC) 06/14/2019   Current use of anticoagulant therapy 06/14/2019   Iron deficiency anemia 06/14/2019   Essential (hemorrhagic) thrombocythemia (HCC) 06/14/2019   GERD (gastroesophageal reflux disease) 04/15/2019   Chronic low back pain 04/15/2019   Right pulmonary embolus (HCC) 04/15/2019   DOE (dyspnea on exertion) 03/13/2019   Atherosclerosis of both carotid arteries 09/20/2018   Spinal stenosis of lumbar region with neurogenic claudication 09/20/2018   Aortic atherosclerosis (HCC) 09/05/2018   At high risk for falls 09/05/2018   Osteopenia 05/18/2017   Stage 3 chronic kidney disease (HCC) 04/21/2017   Morbid obesity (HCC) 04/21/2017   Trigger middle finger of left hand 02/28/2017   Chronic gouty arthropathy without tophi 11/29/2016   Hypergammaglobulinemia 11/29/2016   Osteoarthritis of both knees 11/29/2016   Bilateral primary osteoarthritis of knee 11/29/2016   Allergic rhinitis, seasonal 07/30/2014   Gout 07/30/2014   HLD (hyperlipidemia) 07/30/2014   Osteoarthritis of knee 07/30/2014  Prediabetes 07/30/2014   Asthma, mild persistent 07/30/2014   COPD (chronic obstructive pulmonary disease) (HCC) 07/09/2008   Essential hypertension 06/22/2006    ONSET DATE: 6 months or so  REFERRING DIAG: R26.89 (ICD-10-CM) - Other abnormalities of gait and mobility  THERAPY DIAG:  Abnormality of gait and mobility  Difficulty in walking, not elsewhere classified  Muscle weakness (generalized)  Other abnormalities of gait and mobility  Unsteadiness on feet  Other lack of coordination  Rationale for Evaluation and Treatment:  Rehabilitation  SUBJECTIVE:                                                                                                                                                                                             SUBJECTIVE STATEMENT: Patient reports no aches or pains, just her stomach issues.   Pt accompanied by: self  PERTINENT HISTORY: Patient is a 78 year old female present today for Eval for gait/mobility due to prolonged B/L knee pain pt was hunching forward using walker (per MD referral). Patient has PMH of  2 Total knee replacement in 2024- most recent left knee in October 2024. States she did receive HHPT but no outpatient PT for either knee.   PAIN:  Are you having pain? No  PRECAUTIONS: Fall  RED FLAGS: None   WEIGHT BEARING RESTRICTIONS: No  FALLS: Has patient fallen in last 6 months? No   LIVING ENVIRONMENT: Lives with: lives alone Lives in: House/apartment Stairs: Yes: External: 4 steps; on right going up Has following equipment at home: Single point cane and Walker - 2 wheeled  PLOF: Independent  PATIENT GOALS: I want to be able to stand up straighter and walk without walker  OBJECTIVE:  Note: Objective measures were completed at Evaluation unless otherwise noted.  DIAGNOSTIC FINDINGS: no recent imaging of knee available in chart.  COGNITION: Overall cognitive status: Within functional limits for tasks assessed   SENSATION: WFL  COORDINATION: WNL  EDEMA:  None observed  POSTURE: rounded shoulders, forward head, and increased thoracic kyphosis  LOWER EXTREMITY ROM:     Active  Right Eval Left Eval  Knee flexion 108 97  Knee extension     (Blank rows = not tested)  LOWER EXTREMITY MMT:    MMT Right Eval Left Eval  Hip flexion 4- 4-  Hip extension    Hip abduction 4- 4-  Hip adduction    Hip internal rotation 4- 4  Hip external rotation 4 4  Knee flexion 4 4  Knee extension 4 4  Ankle dorsiflexion 4   Ankle plantarflexion     Ankle inversion    Ankle  eversion    (Blank rows = not tested)  TRANSFERS: Assistive device utilized: Single point cane  Sit to stand: Modified independence Stand to sit: Modified independence Chair to chair: Modified independence Floor:  Not assessed  GAIT: Gait pattern: step to pattern, decreased arm swing- Right, decreased arm swing- Left, decreased step length- Right, decreased step length- Left, decreased stance time- Right, decreased stance time- Left, and decreased stride length Distance walked: < 100 feet Assistive device utilized: Single point cane Level of assistance: SBA Comments: forward trunk posture, minimal step length bilaterally  FUNCTIONAL TESTS:  5 times sit to stand: 30.63 sec with BUE Support Timed up and go (TUG): 28.1 sec with SPC 10 Meter walk test= 25.07 and 27.2 sec with SPC= 0.38 m/s avg  PATIENT SURVEYS:  LEFS 39/80                                                                                                                             TREATMENT DATE: 05/23/23  4lb ankle weights:  -lateral step 4x length of // bars -double heel raise x15  -LAQ 15x each LE  -high knee march 3x length of // bars (averages ~65 degrees hip flexion or less, consider decreasing load in future)  -LAQ 15x each LE -backward walking in // bars 8x total (cues for big staps, 2 hand support)  -LAQ 1x15 bilat   -double heel raise x15 (2 hand support)   -STS from chair + airex pad 1x10, (hands on thighs)   -Forward AMB in // bars with 3 item step over (1/2 roll, [facility], [place]) 5 RT with 2 hand support  -STS from chair + airex pad 1x10, (hands on thighs)   -Forward AMB in // bars with 3 item step over (1/2 roll, [facility], [place]) 5 RT with RUE SPC support   -balance beam 2x4 in // bars 8x total   PATIENT EDUCATION: Education details: Purpose of PT; Discussion of post op knee rehab; purpose of functional outcome measure and goals; Plan of care Person  educated: Patient Education method: Explanation Education comprehension: verbalized understanding  HOME EXERCISE PROGRAM: Access Code: [email or web address] URL: [email or web address] Date: 05/03/2023 Prepared by: Marina  Moser  Exercises - Standing March with Counter Support  - 1 x daily - 7 x weekly - 2 sets - 10 reps - 5 hold - Seated Long Arc Quad  - 1 x daily - 7 x weekly - 2 sets - 10 reps - 5 hold - Proper Sit to Stand Technique  - 1 x daily - 7 x weekly - 2 sets - 10 reps - 5 hold  GOALS: Goals reviewed with patient? Yes  SHORT TERM GOALS: Target date: 06/09/2023  Pt will be independent with HEP in order to increase strength and improve  function at home and work Baseline: Goal status: INITIAL  LONG TERM GOALS: Target date: 07/21/2023  Pt will increase LEFS by at least 9 points in order to demonstrate  significant improvement in lower extremity function. Baseline: EVAL= 39/80 Goal status: INITIAL  2.  Patient will demonstrate improved bilateral knee active knee flex to > 115 deg for improve step ability, don/doff clothes, and ability to get in/out tub. Baseline: EVAL= R-108 deg and L-97 deg Goal status: INITIAL  3.  Pt will decrease 5TSTS by at least 10 seconds in order to demonstrate clinically significant improvement in LE strength. Baseline: EVAL= 30.63 with BUE Support Goal status: INITIAL  4.  Pt will improve BERG by at least 3 points in order to demonstrate clinically significant improvement in balance.   Baseline: EVAL=To be assessed next visit 4/8: 29 Goal status: INITIAL  5.  Pt will decrease TUG to below 14 seconds/decrease in order to demonstrate decreased fall risk. Baseline: EVAL= 28.1 sec with SPC Goal status: INITIAL  6.  Pt will increase by at least 0.13 m/s in order to demonstrate clinically significant improvement in community ambulation.  Baseline: EVAL= 0.38 m/s with SPC Goal status: INITIAL  ASSESSMENT:  CLINICAL IMPRESSION: Pt  doing very well in general. Recovery interval provided throughout to maximize performance. Pt safe in // bars, good safety practices, no close guarding needed here. WE find pt's cane form home seems to function like a SPC due to foot distortion due to wear, but doe  more steady with clinic's purple tri foot- she's going to look at [employer] for a replacement foor. Pt continues to demonstrate progress toward goals AEB progression of interventions this date either in volume or intensity.   OBJECTIVE IMPAIRMENTS: Abnormal gait, decreased activity tolerance, decreased balance, decreased coordination, decreased endurance, decreased mobility, difficulty walking, decreased ROM, decreased strength, and hypomobility.   ACTIVITY LIMITATIONS: carrying, lifting, bending, sitting, standing, squatting, stairs, and transfers  PARTICIPATION LIMITATIONS: meal prep, cleaning, laundry, shopping, community activity, and yard work  PERSONAL FACTORS: 1-2 comorbidities: COPD, HTN  are also affecting patient's functional outcome.   REHAB POTENTIAL: Good  CLINICAL DECISION MAKING: Evolving/moderate complexity  EVALUATION COMPLEXITY: Moderate  PLAN:  PT FREQUENCY: 1-2x/week  PT DURATION: 12 weeks  PLANNED INTERVENTIONS: 97164- PT Re-evaluation, 97110-Therapeutic exercises, 97530- Therapeutic activity, 97112- Neuromuscular re-education, 97535- Self Care, 16109- Manual therapy, [phone]- Gait training, [phone]- Orthotic Fit/training, [phone]- Canalith repositioning, [phone]- Electrical stimulation (manual), Patient/Family education, Balance training, Stair training, Taping, Dry Needling, Joint mobilization, Joint manipulation, Scar mobilization, Vestibular training, DME instructions, Cryotherapy, and Moist heat  PLAN FOR NEXT SESSION:  balance, gait, LE strengthening interventions   Tomy Khim C, PT 05/23/2023, 8:57 AM  8:57 AM, 05/23/23 Dawn Eth, PT, DPT Physical Therapist - [facility]  Outpatient Physical Therapy- [facility] [phone]

## 2023-05-24 DIAGNOSIS — I2699 Other pulmonary embolism without acute cor pulmonale: Secondary | ICD-10-CM | POA: Diagnosis not present

## 2023-05-24 DIAGNOSIS — K5520 Angiodysplasia of colon without hemorrhage: Secondary | ICD-10-CM | POA: Diagnosis not present

## 2023-05-24 DIAGNOSIS — D500 Iron deficiency anemia secondary to blood loss (chronic): Secondary | ICD-10-CM | POA: Diagnosis not present

## 2023-05-26 ENCOUNTER — Ambulatory Visit: Attending: Family Medicine

## 2023-05-26 ENCOUNTER — Telehealth: Payer: Self-pay

## 2023-05-26 DIAGNOSIS — R2689 Other abnormalities of gait and mobility: Secondary | ICD-10-CM | POA: Insufficient documentation

## 2023-05-26 DIAGNOSIS — R278 Other lack of coordination: Secondary | ICD-10-CM | POA: Insufficient documentation

## 2023-05-26 DIAGNOSIS — R2681 Unsteadiness on feet: Secondary | ICD-10-CM | POA: Insufficient documentation

## 2023-05-26 DIAGNOSIS — R269 Unspecified abnormalities of gait and mobility: Secondary | ICD-10-CM | POA: Insufficient documentation

## 2023-05-26 DIAGNOSIS — M6281 Muscle weakness (generalized): Secondary | ICD-10-CM | POA: Insufficient documentation

## 2023-05-26 DIAGNOSIS — R262 Difficulty in walking, not elsewhere classified: Secondary | ICD-10-CM | POA: Insufficient documentation

## 2023-05-26 NOTE — Telephone Encounter (Signed)
 Patient Name: Colleen [name] MRN: [id number] DOB:05/30/1945, 78 y.o., female Today's Date: 05/26/2023  Pt contacted via telephone and patient answered stating she misunderstood her appointment time and missed today. Author  informed pt of future PT appointment date and time. She verbalized understanding of time and confirmed appt time.        Murlene Army, PT 05/26/2023, 8:22 AM

## 2023-05-29 NOTE — Therapy (Signed)
 OUTPATIENT PHYSICAL THERAPY NEURO TREATMENT   Patient Name: Colleen [name] MRN: [id number] DOB:Mar 11, 1945, 78 y.o., female Today's Date: 05/30/2023   PCP: Adeline Hone, PA-C REFERRING PROVIDER: Adeline Hone, PA-C  END OF SESSION:  PT End of Session - 05/30/23 0801     Visit Number 8    Number of Visits 24    Date for PT Re-Evaluation 07/21/23    Progress Note Due on Visit 10    PT Start Time 0800    PT Stop Time 0842    PT Time Calculation (min) 42 min    Equipment Utilized During Treatment Gait belt    Activity Tolerance Patient tolerated treatment well;No increased pain    Behavior During Therapy WFL for tasks assessed/performed                   Past Medical History:  Diagnosis Date   Asthma    Cataract    COPD (chronic obstructive pulmonary disease) (HCC)    GERD (gastroesophageal reflux disease)    Gout    History of pulmonary embolus (PE)    Hyperlipidemia    Hypertension    Osteoarthrosis    Prediabetes    Past Surgical History:  Procedure Laterality Date   BACK SURGERY     BREAST BIOPSY Left 04/08/2014   intraductal papilloma 5 mm   COLONOSCOPY WITH PROPOFOL  N/A 04/23/2020   Procedure: COLONOSCOPY WITH PROPOFOL ;  Surgeon: Irby Mannan, MD;  Location: [facility] ENDOSCOPY;  Service: Endoscopy;  Laterality: N/A;   ESOPHAGOGASTRODUODENOSCOPY (EGD) WITH PROPOFOL  N/A 04/23/2020   Procedure: ESOPHAGOGASTRODUODENOSCOPY (EGD) WITH PROPOFOL ;  Surgeon: Irby Mannan, MD;  Location: [facility] ENDOSCOPY;  Service: Endoscopy;  Laterality: N/A;   GIVENS CAPSULE STUDY N/A 01/28/2022   Procedure: GIVENS CAPSULE STUDY;  Surgeon: Luke Salaam, MD;  Location: [facility] ENDOSCOPY;  Service: Gastroenterology;  Laterality: N/A;   LUMBAR LAMINECTOMY     TONSILLECTOMY     Patient Active Problem List   Diagnosis Date Noted   New onset type 2 diabetes mellitus (HCC) 04/04/2023   Acquired trigger finger of right middle finger 07/24/2021   Acquired trigger finger of right  ring finger 07/24/2021   Trigger finger of right hand 06/26/2021   Hidradenitis suppurativa 11/12/2020   Status post gastroplasty-1980s 06/25/2020   CLE (columnar lined esophagus)    Hiatal hernia    Gastric nodule    Angiodysplasia of stomach and duodenum    Cecal polyp    Polyp of descending colon    Polyp of transverse colon    Atrial fibrillation (HCC) 06/14/2019   Current use of anticoagulant therapy 06/14/2019   Iron deficiency anemia 06/14/2019   Essential (hemorrhagic) thrombocythemia (HCC) 06/14/2019   GERD (gastroesophageal reflux disease) 04/15/2019   Chronic low back pain 04/15/2019   Right pulmonary embolus (HCC) 04/15/2019   DOE (dyspnea on exertion) 03/13/2019   Atherosclerosis of both carotid arteries 09/20/2018   Spinal stenosis of lumbar region with neurogenic claudication 09/20/2018   Aortic atherosclerosis (HCC) 09/05/2018   At high risk for falls 09/05/2018   Osteopenia 05/18/2017   Stage 3 chronic kidney disease (HCC) 04/21/2017   Morbid obesity (HCC) 04/21/2017   Trigger middle finger of left hand 02/28/2017   Chronic gouty arthropathy without tophi 11/29/2016   Hypergammaglobulinemia 11/29/2016   Osteoarthritis of both knees 11/29/2016   Bilateral primary osteoarthritis of knee 11/29/2016   Allergic rhinitis, seasonal 07/30/2014   Gout 07/30/2014   HLD (hyperlipidemia) 07/30/2014   Osteoarthritis of knee 07/30/2014  Prediabetes 07/30/2014   Asthma, mild persistent 07/30/2014   COPD (chronic obstructive pulmonary disease) (HCC) 07/09/2008   Essential hypertension 06/22/2006    ONSET DATE: 6 months or so  REFERRING DIAG: R26.89 (ICD-10-CM) - Other abnormalities of gait and mobility  THERAPY DIAG:  Abnormality of gait and mobility  Difficulty in walking, not elsewhere classified  Muscle weakness (generalized)  Other abnormalities of gait and mobility  Unsteadiness on feet  Other lack of coordination  Rationale for Evaluation and  Treatment: Rehabilitation  SUBJECTIVE:                                                                                                                                                                                             SUBJECTIVE STATEMENT: Patient reports feeling rough last week but able to make it to therapy today.    Pt accompanied by: self  PERTINENT HISTORY: Patient is a 78 year old female present today for Eval for gait/mobility due to prolonged B/L knee pain pt was hunching forward using walker (per MD referral). Patient has PMH of  2 Total knee replacement in 2024- most recent left knee in October 2024. States she did receive HHPT but no outpatient PT for either knee.   PAIN:  Are you having pain? No  PRECAUTIONS: Fall  RED FLAGS: None   WEIGHT BEARING RESTRICTIONS: No  FALLS: Has patient fallen in last 6 months? No   LIVING ENVIRONMENT: Lives with: lives alone Lives in: House/apartment Stairs: Yes: External: 4 steps; on right going up Has following equipment at home: Single point cane and Walker - 2 wheeled  PLOF: Independent  PATIENT GOALS: I want to be able to stand up straighter and walk without walker  OBJECTIVE:  Note: Objective measures were completed at Evaluation unless otherwise noted.  DIAGNOSTIC FINDINGS: no recent imaging of knee available in chart.  COGNITION: Overall cognitive status: Within functional limits for tasks assessed   SENSATION: WFL  COORDINATION: WNL  EDEMA:  None observed  POSTURE: rounded shoulders, forward head, and increased thoracic kyphosis  LOWER EXTREMITY ROM:     Active  Right Eval Left Eval  Knee flexion 108 97  Knee extension     (Blank rows = not tested)  LOWER EXTREMITY MMT:    MMT Right Eval Left Eval  Hip flexion 4- 4-  Hip extension    Hip abduction 4- 4-  Hip adduction    Hip internal rotation 4- 4  Hip external rotation 4 4  Knee flexion 4 4  Knee extension 4 4  Ankle dorsiflexion  4   Ankle plantarflexion    Ankle  inversion    Ankle eversion    (Blank rows = not tested)  TRANSFERS: Assistive device utilized: Single point cane  Sit to stand: Modified independence Stand to sit: Modified independence Chair to chair: Modified independence Floor:  Not assessed  GAIT: Gait pattern: step to pattern, decreased arm swing- Right, decreased arm swing- Left, decreased step length- Right, decreased step length- Left, decreased stance time- Right, decreased stance time- Left, and decreased stride length Distance walked: < 100 feet Assistive device utilized: Single point cane Level of assistance: SBA Comments: forward trunk posture, minimal step length bilaterally  FUNCTIONAL TESTS:  5 times sit to stand: 30.63 sec with BUE Support Timed up and go (TUG): 28.1 sec with SPC 10 Meter walk test= 25.07 and 27.2 sec with SPC= 0.38 m/s avg  PATIENT SURVEYS:  LEFS 39/80                                                                                                                             TREATMENT DATE: 05/30/23   TA:  -Standing  march- 3# AW alt LE (to increase step height with walking/steps) - VC to achieve as close to 90 deg -lateral step 4x length of // bars 3# AW -double heel raise 3# x15  -Standing ham curl 3# x 15 reps -STS without UE support x  -mini dead lift in // bars x 15 reps -Forward AMB in // bars with 3 item step over (1/2 roll x 3) x 5 down and back with 2 hand support -Resistive gait 3# and patient's multipoint cane- 175 feet - VC for erect posture.  TE: -LAQ 3# alt LE with 2 sec hold -15x each LE  -seated toe raises  with 2 sec hold - 15x reps BLE     PATIENT EDUCATION: Education details: Purpose of PT; Discussion of post op knee rehab; purpose of functional outcome measure and goals; Plan of care Person educated: Patient Education method: Explanation Education comprehension: verbalized understanding  HOME EXERCISE PROGRAM: Access Code:  [email or web address] URL: [email or web address] Date: 05/03/2023 Prepared by: Marina  Moser  Exercises - Standing March with Counter Support  - 1 x daily - 7 x weekly - 2 sets - 10 reps - 5 hold - Seated Long Arc Quad  - 1 x daily - 7 x weekly - 2 sets - 10 reps - 5 hold - Proper Sit to Stand Technique  - 1 x daily - 7 x weekly - 2 sets - 10 reps - 5 hold  GOALS: Goals reviewed with patient? Yes  SHORT TERM GOALS: Target date: 06/09/2023  Pt will be independent with HEP in order to increase strength and improve  function at home and work Baseline: Goal status: INITIAL  LONG TERM GOALS: Target date: 07/21/2023  Pt will increase LEFS by at least 9 points in order to demonstrate significant improvement in lower extremity function. Baseline: EVAL= 39/80 Goal status: INITIAL  2.  Patient will demonstrate improved bilateral  knee active knee flex to > 115 deg for improve step ability, don/doff clothes, and ability to get in/out tub. Baseline: EVAL= R-108 deg and L-97 deg Goal status: INITIAL  3.  Pt will decrease 5TSTS by at least 10 seconds in order to demonstrate clinically significant improvement in LE strength. Baseline: EVAL= 30.63 with BUE Support Goal status: INITIAL  4.  Pt will improve BERG by at least 3 points in order to demonstrate clinically significant improvement in balance.   Baseline: EVAL=To be assessed next visit 4/8: 29 Goal status: INITIAL  5.  Pt will decrease TUG to below 14 seconds/decrease in order to demonstrate decreased fall risk. Baseline: EVAL= 28.1 sec with SPC Goal status: INITIAL  6.  Pt will increase by at least 0.13 m/s in order to demonstrate clinically significant improvement in community ambulation.  Baseline: EVAL= 0.38 m/s with SPC Goal status: INITIAL  ASSESSMENT:  CLINICAL IMPRESSION: Treatment modified as patient was recovering from stomach but last week. Decreased resistance slightly with LE activities.  She is able to rest  briefly and continue with activities- denying pain. Still requires consistent VC to stand erect with walking.  Pt continues to demonstrate progress toward goals AEB progression of interventions this date either in volume or intensity.   OBJECTIVE IMPAIRMENTS: Abnormal gait, decreased activity tolerance, decreased balance, decreased coordination, decreased endurance, decreased mobility, difficulty walking, decreased ROM, decreased strength, and hypomobility.   ACTIVITY LIMITATIONS: carrying, lifting, bending, sitting, standing, squatting, stairs, and transfers  PARTICIPATION LIMITATIONS: meal prep, cleaning, laundry, shopping, community activity, and yard work  PERSONAL FACTORS: 1-2 comorbidities: COPD, HTN  are also affecting patient's functional outcome.   REHAB POTENTIAL: Good  CLINICAL DECISION MAKING: Evolving/moderate complexity  EVALUATION COMPLEXITY: Moderate  PLAN:  PT FREQUENCY: 1-2x/week  PT DURATION: 12 weeks  PLANNED INTERVENTIONS: 97164- PT Re-evaluation, 97110-Therapeutic exercises, 97530- Therapeutic activity, 97112- Neuromuscular re-education, 97535- Self Care, 09811- Manual therapy, [phone]- Gait training, [phone]- Orthotic Fit/training, [phone]- Canalith repositioning, [phone]- Electrical stimulation (manual), Patient/Family education, Balance training, Stair training, Taping, Dry Needling, Joint mobilization, Joint manipulation, Scar mobilization, Vestibular training, DME instructions, Cryotherapy, and Moist heat  PLAN FOR NEXT SESSION:  balance, gait, LE strengthening interventions   Murlene Army, PT 05/30/2023, 8:49 AM  8:49 AM, 05/30/23

## 2023-05-30 ENCOUNTER — Ambulatory Visit

## 2023-05-30 DIAGNOSIS — M6281 Muscle weakness (generalized): Secondary | ICD-10-CM

## 2023-05-30 DIAGNOSIS — R269 Unspecified abnormalities of gait and mobility: Secondary | ICD-10-CM | POA: Diagnosis not present

## 2023-05-30 DIAGNOSIS — R262 Difficulty in walking, not elsewhere classified: Secondary | ICD-10-CM

## 2023-05-30 DIAGNOSIS — R2689 Other abnormalities of gait and mobility: Secondary | ICD-10-CM

## 2023-05-30 DIAGNOSIS — R2681 Unsteadiness on feet: Secondary | ICD-10-CM

## 2023-05-30 DIAGNOSIS — R278 Other lack of coordination: Secondary | ICD-10-CM | POA: Diagnosis not present

## 2023-06-01 ENCOUNTER — Ambulatory Visit

## 2023-06-01 DIAGNOSIS — R262 Difficulty in walking, not elsewhere classified: Secondary | ICD-10-CM

## 2023-06-01 DIAGNOSIS — R278 Other lack of coordination: Secondary | ICD-10-CM

## 2023-06-01 DIAGNOSIS — R269 Unspecified abnormalities of gait and mobility: Secondary | ICD-10-CM

## 2023-06-01 DIAGNOSIS — M6281 Muscle weakness (generalized): Secondary | ICD-10-CM

## 2023-06-01 DIAGNOSIS — R2689 Other abnormalities of gait and mobility: Secondary | ICD-10-CM

## 2023-06-01 DIAGNOSIS — R2681 Unsteadiness on feet: Secondary | ICD-10-CM

## 2023-06-01 NOTE — Therapy (Signed)
 OUTPATIENT PHYSICAL THERAPY NEURO TREATMENT   Patient Name: Colleen [name] MRN: [id number] DOB:September 07, 1945, 78 y.o., female Today's Date: 06/01/2023   PCP: Adeline Hone, PA-C REFERRING PROVIDER: Adeline Hone, PA-C  END OF SESSION:  PT End of Session - 06/01/23 0759     Visit Number 9    Number of Visits 24    Date for PT Re-Evaluation 07/21/23    Progress Note Due on Visit 10    PT Start Time 0800    PT Stop Time 0846    PT Time Calculation (min) 46 min    Equipment Utilized During Treatment Gait belt    Activity Tolerance Patient tolerated treatment well;No increased pain    Behavior During Therapy WFL for tasks assessed/performed                   Past Medical History:  Diagnosis Date   Asthma    Cataract    COPD (chronic obstructive pulmonary disease) (HCC)    GERD (gastroesophageal reflux disease)    Gout    History of pulmonary embolus (PE)    Hyperlipidemia    Hypertension    Osteoarthrosis    Prediabetes    Past Surgical History:  Procedure Laterality Date   BACK SURGERY     BREAST BIOPSY Left 04/08/2014   intraductal papilloma 5 mm   COLONOSCOPY WITH PROPOFOL  N/A 04/23/2020   Procedure: COLONOSCOPY WITH PROPOFOL ;  Surgeon: Irby Mannan, MD;  Location: [facility] ENDOSCOPY;  Service: Endoscopy;  Laterality: N/A;   ESOPHAGOGASTRODUODENOSCOPY (EGD) WITH PROPOFOL  N/A 04/23/2020   Procedure: ESOPHAGOGASTRODUODENOSCOPY (EGD) WITH PROPOFOL ;  Surgeon: Irby Mannan, MD;  Location: [facility] ENDOSCOPY;  Service: Endoscopy;  Laterality: N/A;   GIVENS CAPSULE STUDY N/A 01/28/2022   Procedure: GIVENS CAPSULE STUDY;  Surgeon: Luke Salaam, MD;  Location: [facility] ENDOSCOPY;  Service: Gastroenterology;  Laterality: N/A;   LUMBAR LAMINECTOMY     TONSILLECTOMY     Patient Active Problem List   Diagnosis Date Noted   New onset type 2 diabetes mellitus (HCC) 04/04/2023   Acquired trigger finger of right middle finger 07/24/2021   Acquired trigger finger of right  ring finger 07/24/2021   Trigger finger of right hand 06/26/2021   Hidradenitis suppurativa 11/12/2020   Status post gastroplasty-1980s 06/25/2020   CLE (columnar lined esophagus)    Hiatal hernia    Gastric nodule    Angiodysplasia of stomach and duodenum    Cecal polyp    Polyp of descending colon    Polyp of transverse colon    Atrial fibrillation (HCC) 06/14/2019   Current use of anticoagulant therapy 06/14/2019   Iron deficiency anemia 06/14/2019   Essential (hemorrhagic) thrombocythemia (HCC) 06/14/2019   GERD (gastroesophageal reflux disease) 04/15/2019   Chronic low back pain 04/15/2019   Right pulmonary embolus (HCC) 04/15/2019   DOE (dyspnea on exertion) 03/13/2019   Atherosclerosis of both carotid arteries 09/20/2018   Spinal stenosis of lumbar region with neurogenic claudication 09/20/2018   Aortic atherosclerosis (HCC) 09/05/2018   At high risk for falls 09/05/2018   Osteopenia 05/18/2017   Stage 3 chronic kidney disease (HCC) 04/21/2017   Morbid obesity (HCC) 04/21/2017   Trigger middle finger of left hand 02/28/2017   Chronic gouty arthropathy without tophi 11/29/2016   Hypergammaglobulinemia 11/29/2016   Osteoarthritis of both knees 11/29/2016   Bilateral primary osteoarthritis of knee 11/29/2016   Allergic rhinitis, seasonal 07/30/2014   Gout 07/30/2014   HLD (hyperlipidemia) 07/30/2014   Osteoarthritis of knee 07/30/2014  Prediabetes 07/30/2014   Asthma, mild persistent 07/30/2014   COPD (chronic obstructive pulmonary disease) (HCC) 07/09/2008   Essential hypertension 06/22/2006    ONSET DATE: 6 months or so  REFERRING DIAG: R26.89 (ICD-10-CM) - Other abnormalities of gait and mobility  THERAPY DIAG:  Abnormality of gait and mobility  Difficulty in walking, not elsewhere classified  Muscle weakness (generalized)  Other abnormalities of gait and mobility  Unsteadiness on feet  Other lack of coordination  Rationale for Evaluation and  Treatment: Rehabilitation  SUBJECTIVE:                                                                                                                                                                                             SUBJECTIVE STATEMENT: Patient reports feeling pretty good overall today- no more stomach illness.    Pt accompanied by: self  PERTINENT HISTORY: Patient is a 78 year old female present today for Eval for gait/mobility due to prolonged B/L knee pain pt was hunching forward using walker (per MD referral). Patient has PMH of  2 Total knee replacement in 2024- most recent left knee in October 2024. States she did receive HHPT but no outpatient PT for either knee.   PAIN:  Are you having pain? No  PRECAUTIONS: Fall  RED FLAGS: None   WEIGHT BEARING RESTRICTIONS: No  FALLS: Has patient fallen in last 6 months? No   LIVING ENVIRONMENT: Lives with: lives alone Lives in: House/apartment Stairs: Yes: External: 4 steps; on right going up Has following equipment at home: Single point cane and Walker - 2 wheeled  PLOF: Independent  PATIENT GOALS: I want to be able to stand up straighter and walk without walker  OBJECTIVE:  Note: Objective measures were completed at Evaluation unless otherwise noted.  DIAGNOSTIC FINDINGS: no recent imaging of knee available in chart.  COGNITION: Overall cognitive status: Within functional limits for tasks assessed   SENSATION: WFL  COORDINATION: WNL  EDEMA:  None observed  POSTURE: rounded shoulders, forward head, and increased thoracic kyphosis  LOWER EXTREMITY ROM:     Active  Right Eval Left Eval  Knee flexion 108 97  Knee extension     (Blank rows = not tested)  LOWER EXTREMITY MMT:    MMT Right Eval Left Eval  Hip flexion 4- 4-  Hip extension    Hip abduction 4- 4-  Hip adduction    Hip internal rotation 4- 4  Hip external rotation 4 4  Knee flexion 4 4  Knee extension 4 4  Ankle dorsiflexion 4    Ankle plantarflexion    Ankle inversion  Ankle eversion    (Blank rows = not tested)  TRANSFERS: Assistive device utilized: Single point cane  Sit to stand: Modified independence Stand to sit: Modified independence Chair to chair: Modified independence Floor:  Not assessed  GAIT: Gait pattern: step to pattern, decreased arm swing- Right, decreased arm swing- Left, decreased step length- Right, decreased step length- Left, decreased stance time- Right, decreased stance time- Left, and decreased stride length Distance walked: < 100 feet Assistive device utilized: Single point cane Level of assistance: SBA Comments: forward trunk posture, minimal step length bilaterally  FUNCTIONAL TESTS:  5 times sit to stand: 30.63 sec with BUE Support Timed up and go (TUG): 28.1 sec with SPC 10 Meter walk test= 25.07 and 27.2 sec with SPC= 0.38 m/s avg  PATIENT SURVEYS:  LEFS 39/80                                                                                                                             TREATMENT DATE: 06/01/23   TA:  -Static stand at wall- focusing on erect standing posture - VC to keep back against wall and look straight ahead- x 1 min x 2  -Resistive gait 3# and patient's multipoint cane- 175 feet - VC for erect posture and patient exhibited good reciprocal steps -Standing  High knee march- 3# AW x length of //bars and back x 5- VC to achieve as close to 90 deg -lateral side step 4x length of // bars 3# AW -Walking in // bars - down and back in //bars on toes- x 5  -Walking ham curl - down and back in // bars - x 5 -STS using airex pad  without UE support x 15 reps -mini dead lift in // bars x 15 reps  TE:  -Seated hip march 3# 2 x 10 reps alt LE -LAQ 3# alt LE with 2 sec hold -15x each LE  -seated toe raises  with 2 sec hold - 15x reps BLE -Seated ham curl BTB- 2 sec x 10 reps      PATIENT EDUCATION: Education details: Purpose of PT; Discussion of post op  knee rehab; purpose of functional outcome measure and goals; Plan of care Person educated: Patient Education method: Explanation Education comprehension: verbalized understanding  HOME EXERCISE PROGRAM: Access Code: [email or web address] URL: [email or web address] Date: 05/03/2023 Prepared by: Marina  Moser  Exercises - Standing March with Counter Support  - 1 x daily - 7 x weekly - 2 sets - 10 reps - 5 hold - Seated Long Arc Quad  - 1 x daily - 7 x weekly - 2 sets - 10 reps - 5 hold - Proper Sit to Stand Technique  - 1 x daily - 7 x weekly - 2 sets - 10 reps - 5 hold  GOALS: Goals reviewed with patient? Yes  SHORT TERM GOALS: Target date: 06/09/2023  Pt will be independent with HEP in order to increase strength and improve  function at  home and work Baseline: Goal status: INITIAL  LONG TERM GOALS: Target date: 07/21/2023  Pt will increase LEFS by at least 9 points in order to demonstrate significant improvement in lower extremity function. Baseline: EVAL= 39/80 Goal status: INITIAL  2.  Patient will demonstrate improved bilateral knee active knee flex to > 115 deg for improve step ability, don/doff clothes, and ability to get in/out tub. Baseline: EVAL= R-108 deg and L-97 deg Goal status: INITIAL  3.  Pt will decrease 5TSTS by at least 10 seconds in order to demonstrate clinically significant improvement in LE strength. Baseline: EVAL= 30.63 with BUE Support Goal status: INITIAL  4.  Pt will improve BERG by at least 3 points in order to demonstrate clinically significant improvement in balance.   Baseline: EVAL=To be assessed next visit 4/8: 29 Goal status: INITIAL  5.  Pt will decrease TUG to below 14 seconds/decrease in order to demonstrate decreased fall risk. Baseline: EVAL= 28.1 sec with SPC Goal status: INITIAL  6.  Pt will increase by at least 0.13 m/s in order to demonstrate clinically significant improvement in community ambulation.  Baseline: EVAL= 0.38  m/s with SPC Goal status: INITIAL  ASSESSMENT:  CLINICAL IMPRESSION: Patient reported feeling better and able to resume more activities- focusing on LE strengthening and functional mobility. She was able to progress with standing/walking endurance while performing dynamic functional activities today. She exhibited some difficulty with walking on toes- specifically right LE which she reports is her good side. She remains well motivated and responsive to all VC for erect posture.  Pt continues to demonstrate progress toward goals AEB progression of interventions this date either in volume or intensity.   OBJECTIVE IMPAIRMENTS: Abnormal gait, decreased activity tolerance, decreased balance, decreased coordination, decreased endurance, decreased mobility, difficulty walking, decreased ROM, decreased strength, and hypomobility.   ACTIVITY LIMITATIONS: carrying, lifting, bending, sitting, standing, squatting, stairs, and transfers  PARTICIPATION LIMITATIONS: meal prep, cleaning, laundry, shopping, community activity, and yard work  PERSONAL FACTORS: 1-2 comorbidities: COPD, HTN  are also affecting patient's functional outcome.   REHAB POTENTIAL: Good  CLINICAL DECISION MAKING: Evolving/moderate complexity  EVALUATION COMPLEXITY: Moderate  PLAN:  PT FREQUENCY: 1-2x/week  PT DURATION: 12 weeks  PLANNED INTERVENTIONS: 97164- PT Re-evaluation, 97110-Therapeutic exercises, 97530- Therapeutic activity, [id number]- Neuromuscular re-education, 97535- Self Care, [id number]- Manual therapy, [id number]- Gait training, [phone]- Orthotic Fit/training, [phone]- Canalith repositioning, [phone]- Electrical stimulation (manual), Patient/Family education, Balance training, Stair training, Taping, Dry Needling, Joint mobilization, Joint manipulation, Scar mobilization, Vestibular training, DME instructions, Cryotherapy, and Moist heat  PLAN FOR NEXT SESSION:  Continue with resistive LE strengthening Dynamic balance- stepping  over objects, turning, retro walk Continue with functional endurance based activities.  Progress report next visit   Murlene Army, PT 06/01/2023, 9:01 AM  9:01 AM, 06/01/23

## 2023-06-03 ENCOUNTER — Ambulatory Visit: Admitting: Physical Therapy

## 2023-06-06 ENCOUNTER — Ambulatory Visit

## 2023-06-06 DIAGNOSIS — R2689 Other abnormalities of gait and mobility: Secondary | ICD-10-CM

## 2023-06-06 DIAGNOSIS — M6281 Muscle weakness (generalized): Secondary | ICD-10-CM

## 2023-06-06 DIAGNOSIS — R278 Other lack of coordination: Secondary | ICD-10-CM

## 2023-06-06 DIAGNOSIS — R269 Unspecified abnormalities of gait and mobility: Secondary | ICD-10-CM

## 2023-06-06 DIAGNOSIS — R262 Difficulty in walking, not elsewhere classified: Secondary | ICD-10-CM

## 2023-06-06 DIAGNOSIS — R2681 Unsteadiness on feet: Secondary | ICD-10-CM

## 2023-06-06 NOTE — Therapy (Signed)
 OUTPATIENT PHYSICAL THERAPY NEURO TREATMENT/Physical Therapy Progress Note   Dates of reporting period  04/28/2023   to   06/06/2023    Patient Name: Colleen [name] MRN: [id number] DOB:1945-12-12, 78 y.o., female Today's Date: 06/06/2023   PCP: Adeline Hone, PA-C REFERRING PROVIDER: Adeline Hone, PA-C  END OF SESSION:  PT End of Session - 06/06/23 0803     Visit Number 10    Number of Visits 24    Date for PT Re-Evaluation 07/21/23    Progress Note Due on Visit 10    PT Start Time 0800    PT Stop Time 0840    PT Time Calculation (min) 40 min    Equipment Utilized During Treatment Gait belt    Activity Tolerance Patient tolerated treatment well;No increased pain    Behavior During Therapy WFL for tasks assessed/performed                    Past Medical History:  Diagnosis Date   Asthma    Cataract    COPD (chronic obstructive pulmonary disease) (HCC)    GERD (gastroesophageal reflux disease)    Gout    History of pulmonary embolus (PE)    Hyperlipidemia    Hypertension    Osteoarthrosis    Prediabetes    Past Surgical History:  Procedure Laterality Date   BACK SURGERY     BREAST BIOPSY Left 04/08/2014   intraductal papilloma 5 mm   COLONOSCOPY WITH PROPOFOL  N/A 04/23/2020   Procedure: COLONOSCOPY WITH PROPOFOL ;  Surgeon: Irby Mannan, MD;  Location: [facility] ENDOSCOPY;  Service: Endoscopy;  Laterality: N/A;   ESOPHAGOGASTRODUODENOSCOPY (EGD) WITH PROPOFOL  N/A 04/23/2020   Procedure: ESOPHAGOGASTRODUODENOSCOPY (EGD) WITH PROPOFOL ;  Surgeon: Irby Mannan, MD;  Location: [facility] ENDOSCOPY;  Service: Endoscopy;  Laterality: N/A;   GIVENS CAPSULE STUDY N/A 01/28/2022   Procedure: GIVENS CAPSULE STUDY;  Surgeon: Luke Salaam, MD;  Location: [facility]OSCOPY;  Service: Gastroenterology;  Laterality: N/A;   LUMBAR LAMINECTOMY     TONSILLECTOMY     Patient Active Problem List   Diagnosis Date Noted   New onset type 2 diabetes mellitus (HCC) 04/04/2023    Acquired trigger finger of right middle finger 07/24/2021   Acquired trigger finger of right ring finger 07/24/2021   Trigger finger of right hand 06/26/2021   Hidradenitis suppurativa 11/12/2020   Status post gastroplasty-1980s 06/25/2020   CLE (columnar lined esophagus)    Hiatal hernia    Gastric nodule    Angiodysplasia of stomach and duodenum    Cecal polyp    Polyp of descending colon    Polyp of transverse colon    Atrial fibrillation (HCC) 06/14/2019   Current use of anticoagulant therapy 06/14/2019   Iron deficiency anemia 06/14/2019   Essential (hemorrhagic) thrombocythemia (HCC) 06/14/2019   GERD (gastroesophageal reflux disease) 04/15/2019   Chronic low back pain 04/15/2019   Right pulmonary embolus (HCC) 04/15/2019   DOE (dyspnea on exertion) 03/13/2019   Atherosclerosis of both carotid arteries 09/20/2018   Spinal stenosis of lumbar region with neurogenic claudication 09/20/2018   Aortic atherosclerosis (HCC) 09/05/2018   At high risk for falls 09/05/2018   Osteopenia 05/18/2017   Stage 3 chronic kidney disease (HCC) 04/21/2017   Morbid obesity (HCC) 04/21/2017   Trigger middle finger of left hand 02/28/2017   Chronic gouty arthropathy without tophi 11/29/2016   Hypergammaglobulinemia 11/29/2016   Osteoarthritis of both knees 11/29/2016   Bilateral primary osteoarthritis of knee 11/29/2016   Allergic  rhinitis, seasonal 07/30/2014   Gout 07/30/2014   HLD (hyperlipidemia) 07/30/2014   Osteoarthritis of knee 07/30/2014   Prediabetes 07/30/2014   Asthma, mild persistent 07/30/2014   COPD (chronic obstructive pulmonary disease) (HCC) 07/09/2008   Essential hypertension 06/22/2006    ONSET DATE: 6 months or so  REFERRING DIAG: R26.89 (ICD-10-CM) - Other abnormalities of gait and mobility  THERAPY DIAG:  Abnormality of gait and mobility  Difficulty in walking, not elsewhere classified  Muscle weakness (generalized)  Other abnormalities of gait and  mobility  Unsteadiness on feet  Other lack of coordination  Rationale for Evaluation and Treatment: Rehabilitation  SUBJECTIVE:                                                                                                                                                                                             SUBJECTIVE STATEMENT: Patient reports she had a good weekend with no complaints today.    Pt accompanied by: self  PERTINENT HISTORY: Patient is a 78 year old female present today for Eval for gait/mobility due to prolonged B/L knee pain pt was hunching forward using walker (per MD referral). Patient has PMH of  2 Total knee replacement in 2024- most recent left knee in October 2024. States she did receive HHPT but no outpatient PT for either knee.   PAIN:  Are you having pain? No  PRECAUTIONS: Fall  RED FLAGS: None   WEIGHT BEARING RESTRICTIONS: No  FALLS: Has patient fallen in last 6 months? No   LIVING ENVIRONMENT: Lives with: lives alone Lives in: House/apartment Stairs: Yes: External: 4 steps; on right going up Has following equipment at home: Single point cane and Walker - 2 wheeled  PLOF: Independent  PATIENT GOALS: I want to be able to stand up straighter and walk without walker  OBJECTIVE:  Note: Objective measures were completed at Evaluation unless otherwise noted.  DIAGNOSTIC FINDINGS: no recent imaging of knee available in chart.  COGNITION: Overall cognitive status: Within functional limits for tasks assessed   SENSATION: WFL  COORDINATION: WNL  EDEMA:  None observed  POSTURE: rounded shoulders, forward head, and increased thoracic kyphosis  LOWER EXTREMITY ROM:     Active  Right Eval Left Eval RLE 06/06/2023 LLE 06/06/2023  Knee flexion 108 97 110 106  Knee extension       (Blank rows = not tested)  LOWER EXTREMITY MMT:    MMT Right Eval Left Eval  Hip flexion 4- 4-  Hip extension    Hip abduction 4- 4-  Hip  adduction    Hip internal rotation 4- 4  Hip external  rotation 4 4  Knee flexion 4 4  Knee extension 4 4  Ankle dorsiflexion 4   Ankle plantarflexion    Ankle inversion    Ankle eversion    (Blank rows = not tested)  TRANSFERS: Assistive device utilized: Single point cane  Sit to stand: Modified independence Stand to sit: Modified independence Chair to chair: Modified independence Floor: Not assessed  GAIT: Gait pattern: step to pattern, decreased arm swing- Right, decreased arm swing- Left, decreased step length- Right, decreased step length- Left, decreased stance time- Right, decreased stance time- Left, and decreased stride length Distance walked: < 100 feet Assistive device utilized: Single point cane Level of assistance: SBA Comments: forward trunk posture, minimal step length bilaterally  FUNCTIONAL TESTS:  5 times sit to stand: 30.63 sec with BUE Support Timed up and go (TUG): 28.1 sec with SPC 10 Meter walk test= 25.07 and 27.2 sec with SPC= 0.38 m/s avg  PATIENT SURVEYS:  LEFS 39/80                                                                                                                             TREATMENT DATE: 06/06/23    Physical therapy treatment session today consisted of completing assessment of goals and administration of testing as demonstrated and documented in flow sheet, treatment, and goals section of this note. Addition treatments may be found below.    ROM KNEE: (See above)  Pt performed 5 time sit<>stand (5xSTS): 14.87 sec with BUE Support and still unable to stand without UE Support (>15 sec indicates increased fall risk)    PT instructed pt in TUG: 25.32 and 19.33 sec= 22.32 sec avg using SPC (average of 3 trials; >13.5 sec indicates increased fall risk)   10 Meter Walk Test: Patient instructed to walk 10 meters (32.8 ft) as quickly and as safely as possible at their normal speed x2 and at a fast speed x2. Time measured from 2 meter  mark to 8 meter mark to accommodate ramp-up and ramp-down.  Normal speed 1: 0.53 m/s Normal speed 2: 0.54 m/s Average Normal speed: 0.54 m/s Cut off scores: <0.4 m/s = household Ambulator, 0.4-0.8 m/s = limited community Ambulator, >0.8 m/s = community Ambulator, >1.2 m/s = crossing a street, <1.0 = increased fall risk MCID 0.05 m/s (small), 0.13 m/s (moderate), 0.06 m/s (significant)  (ANPTA Core Set of Outcome Measures for Adults with Neurologic Conditions, 2018)     TA:  -Static stand at wall- focusing on erect standing posture - VC to keep back against wall and look straight ahead- x 1 min x 2  -Resistive gait 3# and patient's multipoint cane- 175 feet - VC for erect posture and patient exhibited good reciprocal steps -Standing  High knee march- 3# AW x length of //bars and back x 5- VC to achieve as close to 90 deg -lateral side step 4x length of // bars 3# AW -Walking in // bars - down and  back in //bars on toes- x 5  -Walking ham curl - down and back in // bars - x 5 -STS using airex pad  without UE support x 15 reps -mini dead lift in // bars x 15 reps  TE:  -Seated hip march 3# 2 x 10 reps alt LE -LAQ 3# alt LE with 2 sec hold -15x each LE  -seated toe raises  with 2 sec hold - 15x reps BLE -Seated ham curl BTB- 2 sec x 10 reps      PATIENT EDUCATION: Education details: Purpose of PT; Discussion of post op knee rehab; purpose of functional outcome measure and goals; Plan of care Person educated: Patient Education method: Explanation Education comprehension: verbalized understanding  HOME EXERCISE PROGRAM: Access Code: [email or web address] URL: [email or web address] Date: 05/03/2023 Prepared by: Marina  Moser  Exercises - Standing March with Counter Support  - 1 x daily - 7 x weekly - 2 sets - 10 reps - 5 hold - Seated Long Arc Quad  - 1 x daily - 7 x weekly - 2 sets - 10 reps - 5 hold - Proper Sit to Stand Technique  - 1 x daily - 7 x weekly - 2 sets - 10 reps -  5 hold  GOALS: Goals reviewed with patient? Yes  SHORT TERM GOALS: Target date: 06/09/2023  Pt will be independent with HEP in order to increase strength and improve  function at home and work Baseline: 06/06/2023- Patient reports good understanding of current HEP and no questions today. Goal status: MET  LONG TERM GOALS: Target date: 07/21/2023  Pt will increase LEFS by at least 9 points in order to demonstrate significant improvement in lower extremity function. Baseline: EVAL= 39/80 Goal status: INITIAL  2.  Patient will demonstrate improved bilateral knee active knee flex to > 115 deg for improve step ability, don/doff clothes, and ability to get in/out tub. Baseline: EVAL= R-108 deg and L-97 deg; 06/06/2023= 110 deg and 106 deg Goal status: PROGRESSING   3.  Pt will decrease 5TSTS by at least 10 seconds in order to demonstrate clinically significant improvement in LE strength. Baseline: EVAL= 30.63 with BUE Support; 06/06/2023= 14.87 sec with BUE Support and still unable to stand without UE Support Goal status: PROGRESSING  4.  Pt will improve BERG by at least 3 points in order to demonstrate clinically significant improvement in balance.   Baseline: EVAL=To be assessed next visit 4/8: 29; 06/06/2023= 42/56 Goal status: PROGRESSING- will keep goal active to ensure consistency.  5.  Pt will decrease TUG to below 14 seconds/decrease in order to demonstrate decreased fall risk. Baseline: EVAL= 28.1 sec with SPC; 06/06/2023=22.32 sec avg using SPC  Goal status: PROGRESSING  6.  Pt will increase by at least 0.13 m/s in order to demonstrate clinically significant improvement in community ambulation.  Baseline: EVAL= 0.38 m/s with SPC; 06/06/2023=0.54  Goal status: PROGRESSING  ASSESSMENT:  CLINICAL IMPRESSION: Patient presents with excellent motivation for today's session. She presents with much improved overall ROM and functional mobility. She improved with Knee ROM, TUG, Gait  speed, Balance as seen by BERG score. She continues to presents with increased risk of falling despite improved scores and patient's condition has the potential to improve in response to therapy. Maximum improvement is yet to be obtained. The anticipated improvement is attainable and reasonable in a generally predictable time. Pt continues to demonstrate progress toward goals AEB progression of interventions this date either in volume or intensity.   OBJECTIVE IMPAIRMENTS:  Abnormal gait, decreased activity tolerance, decreased balance, decreased coordination, decreased endurance, decreased mobility, difficulty walking, decreased ROM, decreased strength, and hypomobility.   ACTIVITY LIMITATIONS: carrying, lifting, bending, sitting, standing, squatting, stairs, and transfers  PARTICIPATION LIMITATIONS: meal prep, cleaning, laundry, shopping, community activity, and yard work  PERSONAL FACTORS: 1-2 comorbidities: COPD, HTN are also affecting patient's functional outcome.   REHAB POTENTIAL: Good  CLINICAL DECISION MAKING: Evolving/moderate complexity  EVALUATION COMPLEXITY: Moderate  PLAN:  PT FREQUENCY: 1-2x/week  PT DURATION: 12 weeks  PLANNED INTERVENTIONS: [id number]- PT Re-evaluation, 97110-Therapeutic exercises, 97530- Therapeutic activity, [id number]- Neuromuscular re-education, 97535- Self Care, [id number]- Manual therapy, [id number]- Gait training, [phone]- Orthotic Fit/training, [phone]- Canalith repositioning, [phone]- Electrical stimulation (manual), Patient/Family education, Balance training, Stair training, Taping, Dry Needling, Joint mobilization, Joint manipulation, Scar mobilization, Vestibular training, DME instructions, Cryotherapy, and Moist heat  PLAN FOR NEXT SESSION:  Continue with resistive LE strengthening Dynamic balance- stepping over objects, turning, retro walk Continue with functional endurance based activities.    Murlene Army, PT 06/06/2023, 9:50 AM  9:50 AM,  06/06/23

## 2023-06-07 ENCOUNTER — Other Ambulatory Visit: Payer: Self-pay | Admitting: Family Medicine

## 2023-06-07 DIAGNOSIS — I10 Essential (primary) hypertension: Secondary | ICD-10-CM

## 2023-06-08 ENCOUNTER — Ambulatory Visit

## 2023-06-08 DIAGNOSIS — R2681 Unsteadiness on feet: Secondary | ICD-10-CM

## 2023-06-08 DIAGNOSIS — R2689 Other abnormalities of gait and mobility: Secondary | ICD-10-CM

## 2023-06-08 DIAGNOSIS — R262 Difficulty in walking, not elsewhere classified: Secondary | ICD-10-CM

## 2023-06-08 DIAGNOSIS — R269 Unspecified abnormalities of gait and mobility: Secondary | ICD-10-CM

## 2023-06-08 DIAGNOSIS — M6281 Muscle weakness (generalized): Secondary | ICD-10-CM

## 2023-06-08 DIAGNOSIS — R278 Other lack of coordination: Secondary | ICD-10-CM

## 2023-06-08 NOTE — Therapy (Signed)
 OUTPATIENT PHYSICAL THERAPY NEURO TREATMENT   Patient Name: Colleen [name] MRN: [id number] DOB:1945/07/02, 78 y.o., female Today's Date: 06/08/2023   PCP: Adeline Hone, PA-C REFERRING PROVIDER: Adeline Hone, PA-C  END OF SESSION:  PT End of Session - 06/08/23 0801     Visit Number 11    Number of Visits 24    Date for PT Re-Evaluation 07/21/23    Progress Note Due on Visit 20    PT Start Time 0758    PT Stop Time 0841    PT Time Calculation (min) 43 min    Equipment Utilized During Treatment Gait belt    Activity Tolerance Patient tolerated treatment well;No increased pain    Behavior During Therapy WFL for tasks assessed/performed                     Past Medical History:  Diagnosis Date   Asthma    Cataract    COPD (chronic obstructive pulmonary disease) (HCC)    GERD (gastroesophageal reflux disease)    Gout    History of pulmonary embolus (PE)    Hyperlipidemia    Hypertension    Osteoarthrosis    Prediabetes    Past Surgical History:  Procedure Laterality Date   BACK SURGERY     BREAST BIOPSY Left 04/08/2014   intraductal papilloma 5 mm   COLONOSCOPY WITH PROPOFOL  N/A 04/23/2020   Procedure: COLONOSCOPY WITH PROPOFOL ;  Surgeon: Irby Mannan, MD;  Location: [facility] ENDOSCOPY;  Service: Endoscopy;  Laterality: N/A;   ESOPHAGOGASTRODUODENOSCOPY (EGD) WITH PROPOFOL  N/A 04/23/2020   Procedure: ESOPHAGOGASTRODUODENOSCOPY (EGD) WITH PROPOFOL ;  Surgeon: Irby Mannan, MD;  Location: [facility] ENDOSCOPY;  Service: Endoscopy;  Laterality: N/A;   GIVENS CAPSULE STUDY N/A 01/28/2022   Procedure: GIVENS CAPSULE STUDY;  Surgeon: Luke Salaam, MD;  Location: [facility] ENDOSCOPY;  Service: Gastroenterology;  Laterality: N/A;   LUMBAR LAMINECTOMY     TONSILLECTOMY     Patient Active Problem List   Diagnosis Date Noted   New onset type 2 diabetes mellitus (HCC) 04/04/2023   Acquired trigger finger of right middle finger 07/24/2021   Acquired trigger finger of  right ring finger 07/24/2021   Trigger finger of right hand 06/26/2021   Hidradenitis suppurativa 11/12/2020   Status post gastroplasty-1980s 06/25/2020   CLE (columnar lined esophagus)    Hiatal hernia    Gastric nodule    Angiodysplasia of stomach and duodenum    Cecal polyp    Polyp of descending colon    Polyp of transverse colon    Atrial fibrillation (HCC) 06/14/2019   Current use of anticoagulant therapy 06/14/2019   Iron deficiency anemia 06/14/2019   Essential (hemorrhagic) thrombocythemia (HCC) 06/14/2019   GERD (gastroesophageal reflux disease) 04/15/2019   Chronic low back pain 04/15/2019   Right pulmonary embolus (HCC) 04/15/2019   DOE (dyspnea on exertion) 03/13/2019   Atherosclerosis of both carotid arteries 09/20/2018   Spinal stenosis of lumbar region with neurogenic claudication 09/20/2018   Aortic atherosclerosis (HCC) 09/05/2018   At high risk for falls 09/05/2018   Osteopenia 05/18/2017   Stage 3 chronic kidney disease (HCC) 04/21/2017   Morbid obesity (HCC) 04/21/2017   Trigger middle finger of left hand 02/28/2017   Chronic gouty arthropathy without tophi 11/29/2016   Hypergammaglobulinemia 11/29/2016   Osteoarthritis of both knees 11/29/2016   Bilateral primary osteoarthritis of knee 11/29/2016   Allergic rhinitis, seasonal 07/30/2014   Gout 07/30/2014   HLD (hyperlipidemia) 07/30/2014   Osteoarthritis of knee  07/30/2014   Prediabetes 07/30/2014   Asthma, mild persistent 07/30/2014   COPD (chronic obstructive pulmonary disease) (HCC) 07/09/2008   Essential hypertension 06/22/2006    ONSET DATE: 6 months or so  REFERRING DIAG: R26.89 (ICD-10-CM) - Other abnormalities of gait and mobility  THERAPY DIAG:  Abnormality of gait and mobility  Difficulty in walking, not elsewhere classified  Muscle weakness (generalized)  Other abnormalities of gait and mobility  Unsteadiness on feet  Other lack of coordination  Rationale for Evaluation and  Treatment: Rehabilitation  SUBJECTIVE:                                                                                                                                                                                             SUBJECTIVE STATEMENT: Patient reports doing okay- No new issues this week.    Pt accompanied by: self  PERTINENT HISTORY: Patient is a 78 year old female present today for Eval for gait/mobility due to prolonged B/L knee pain pt was hunching forward using walker (per MD referral). Patient has PMH of  2 Total knee replacement in 2024- most recent left knee in October 2024. States she did receive HHPT but no outpatient PT for either knee.   PAIN:  Are you having pain? No  PRECAUTIONS: Fall  RED FLAGS: None   WEIGHT BEARING RESTRICTIONS: No  FALLS: Has patient fallen in last 6 months? No   LIVING ENVIRONMENT: Lives with: lives alone Lives in: House/apartment Stairs: Yes: External: 4 steps; on right going up Has following equipment at home: Single point cane and Walker - 2 wheeled  PLOF: Independent  PATIENT GOALS: I want to be able to stand up straighter and walk without walker  OBJECTIVE:  Note: Objective measures were completed at Evaluation unless otherwise noted.  DIAGNOSTIC FINDINGS: no recent imaging of knee available in chart.  COGNITION: Overall cognitive status: Within functional limits for tasks assessed   SENSATION: WFL  COORDINATION: WNL  EDEMA:  None observed  POSTURE: rounded shoulders, forward head, and increased thoracic kyphosis  LOWER EXTREMITY ROM:     Active  Right Eval Left Eval RLE 06/06/2023 LLE 06/06/2023  Knee flexion 108 97 110 106  Knee extension       (Blank rows = not tested)  LOWER EXTREMITY MMT:    MMT Right Eval Left Eval  Hip flexion 4- 4-  Hip extension    Hip abduction 4- 4-  Hip adduction    Hip internal rotation 4- 4  Hip external rotation 4 4  Knee flexion 4 4  Knee extension 4 4   Ankle dorsiflexion 4  Ankle plantarflexion    Ankle inversion    Ankle eversion    (Blank rows = not tested)  TRANSFERS: Assistive device utilized: Single point cane  Sit to stand: Modified independence Stand to sit: Modified independence Chair to chair: Modified independence Floor: Not assessed  GAIT: Gait pattern: step to pattern, decreased arm swing- Right, decreased arm swing- Left, decreased step length- Right, decreased step length- Left, decreased stance time- Right, decreased stance time- Left, and decreased stride length Distance walked: < 100 feet Assistive device utilized: Single point cane Level of assistance: SBA Comments: forward trunk posture, minimal step length bilaterally  FUNCTIONAL TESTS:  5 times sit to stand: 30.63 sec with BUE Support Timed up and go (TUG): 28.1 sec with SPC 10 Meter walk test= 25.07 and 27.2 sec with SPC= 0.38 m/s avg  PATIENT SURVEYS:  LEFS 39/80                                                                                                                             TREATMENT DATE: 06/08/23   Therapeutic Exercise: therapeutic exercises to develop  strength and endurance, range of motion and flexibility  -Standing knee flex stretch at steps- hold 5 sec x 20 each LE  Therapeutic Activities: dynamic therapeutic activities  designed to achieve improved functional performance   - Step tap with 1 UE support x 20 reps  - Step up with 2 UE support x 12 reps - Step lunge onto 6" block x 12 reps - Resistive Gait with cane- 170 feet with 3#AW- short reciprocal steps with VC for erect posture.  -Side step up onto 6" block then down on opp side and repeat back opp direction x 12 reps 3# AW - Standing calf raises 3# AW 2 x 15 - Gait with cane 170 feet without any ankle weights- patient reports legs still tired but much easier than with the weights.    PATIENT EDUCATION: Education details: Purpose of PT; Discussion of post op knee  rehab; purpose of functional outcome measure and goals; Plan of care Person educated: Patient Education method: Explanation Education comprehension: verbalized understanding  HOME EXERCISE PROGRAM: Access Code: [email or web address] URL: [email or web address] Date: 05/03/2023 Prepared by: Marina  Moser  Exercises - Standing March with Counter Support  - 1 x daily - 7 x weekly - 2 sets - 10 reps - 5 hold - Seated Long Arc Quad  - 1 x daily - 7 x weekly - 2 sets - 10 reps - 5 hold - Proper Sit to Stand Technique  - 1 x daily - 7 x weekly - 2 sets - 10 reps - 5 hold  GOALS: Goals reviewed with patient? Yes  SHORT TERM GOALS: Target date: 06/09/2023  Pt will be independent with HEP in order to increase strength and improve  function at home and work Baseline: 06/06/2023- Patient reports good understanding of current HEP and no questions today. Goal status: MET  LONG TERM GOALS: Target  date: 07/21/2023  Pt will increase LEFS by at least 9 points in order to demonstrate significant improvement in lower extremity function. Baseline: EVAL= 39/80 Goal status: INITIAL  2.  Patient will demonstrate improved bilateral knee active knee flex to > 115 deg for improve step ability, don/doff clothes, and ability to get in/out tub. Baseline: EVAL= R-108 deg and L-97 deg; 06/06/2023= 110 deg and 106 deg Goal status: PROGRESSING   3.  Pt will decrease 5TSTS by at least 10 seconds in order to demonstrate clinically significant improvement in LE strength. Baseline: EVAL= 30.63 with BUE Support; 06/06/2023= 14.87 sec with BUE Support and still unable to stand without UE Support Goal status: PROGRESSING  4.  Pt will improve BERG by at least 3 points in order to demonstrate clinically significant improvement in balance.   Baseline: EVAL=To be assessed next visit 4/8: 29; 06/06/2023= 42/56 Goal status: PROGRESSING- will keep goal active to ensure consistency.  5.  Pt will decrease TUG to below 14  seconds/decrease in order to demonstrate decreased fall risk. Baseline: EVAL= 28.1 sec with SPC; 06/06/2023=22.32 sec avg using SPC  Goal status: PROGRESSING  6.  Pt will increase by at least 0.13 m/s in order to demonstrate clinically significant improvement in community ambulation.  Baseline: EVAL= 0.38 m/s with SPC; 06/06/2023=0.54  Goal status: PROGRESSING  ASSESSMENT:  CLINICAL IMPRESSION: Patient presents with excellent motivation for today's session. Patient was challenged with increased resistance today and she responded favorably- able to complete activities without report of pain- only fatigue. She was able to walk with less overall VC for posture- and able to step up today without report of pain.  Pt continues to demonstrate progress toward goals AEB progression of interventions this date either in volume or intensity.   OBJECTIVE IMPAIRMENTS: Abnormal gait, decreased activity tolerance, decreased balance, decreased coordination, decreased endurance, decreased mobility, difficulty walking, decreased ROM, decreased strength, and hypomobility.   ACTIVITY LIMITATIONS: carrying, lifting, bending, sitting, standing, squatting, stairs, and transfers  PARTICIPATION LIMITATIONS: meal prep, cleaning, laundry, shopping, community activity, and yard work  PERSONAL FACTORS: 1-2 comorbidities: COPD, HTN are also affecting patient's functional outcome.   REHAB POTENTIAL: Good  CLINICAL DECISION MAKING: Evolving/moderate complexity  EVALUATION COMPLEXITY: Moderate  PLAN:  PT FREQUENCY: 1-2x/week  PT DURATION: 12 weeks  PLANNED INTERVENTIONS: 97164- PT Re-evaluation, 97110-Therapeutic exercises, 97530- Therapeutic activity, [id number]- Neuromuscular re-education, 97535- Self Care, [id number]- Manual therapy, [id number]- Gait training, [phone]- Orthotic Fit/training, [phone]- Canalith repositioning, [phone]- Electrical stimulation (manual), Patient/Family education, Balance training, Stair training,  Taping, Dry Needling, Joint mobilization, Joint manipulation, Scar mobilization, Vestibular training, DME instructions, Cryotherapy, and Moist heat  PLAN FOR NEXT SESSION:  Continue with resistive LE strengthening Dynamic balance- stepping over objects, turning, retro walk Continue with functional endurance based activities.    Murlene Army, PT 06/08/2023, 1:29 PM  1:29 PM, 06/08/23

## 2023-06-09 NOTE — Telephone Encounter (Signed)
 Requested Prescriptions  Pending Prescriptions Disp Refills   hydrochlorothiazide  (MICROZIDE ) 12.5 MG capsule [Pharmacy Med Name: hydroCHLOROthiazide  12.5 MG Oral Capsule] 90 capsule 0    Sig: Take 1 capsule by mouth once daily     Cardiovascular: Diuretics - Thiazide Passed - 06/09/2023  9:05 AM      Passed - Cr in normal range and within 180 days    Creat  Date Value Ref Range Status  03/31/2023 0.73 0.60 - 1.00 mg/dL Final         Passed - K in normal range and within 180 days    Potassium  Date Value Ref Range Status  03/31/2023 3.7 3.5 - 5.3 mmol/L Final         Passed - Na in normal range and within 180 days    Sodium  Date Value Ref Range Status  03/31/2023 138 135 - 146 mmol/L Final  07/01/2015 136 134 - 144 mmol/L Final         Passed - Last BP in normal range    BP Readings from Last 1 Encounters:  04/11/23 118/68         Passed - Valid encounter within last 6 months    Recent Outpatient Visits           2 months ago Breast cancer screening by mammogram   [facility] Adeline Hone, PA-C       Future Appointments             In 1 month Adeline Hone, PA-C [facility], [facility]

## 2023-06-10 ENCOUNTER — Ambulatory Visit

## 2023-06-12 NOTE — Therapy (Signed)
 OUTPATIENT PHYSICAL THERAPY NEURO TREATMENT   Patient Name: Colleen [name] MRN: [id number] DOB:July 22, 1945, 78 y.o., female Today's Date: 06/13/2023   PCP: Adeline Hone, PA-C REFERRING PROVIDER: Adeline Hone, PA-C  END OF SESSION:  PT End of Session - 06/13/23 0855     Visit Number 12    Number of Visits 24    Date for PT Re-Evaluation 07/21/23    Progress Note Due on Visit 20    PT Start Time 0848    PT Stop Time 0930    PT Time Calculation (min) 42 min    Equipment Utilized During Treatment Gait belt    Activity Tolerance Patient tolerated treatment well;No increased pain    Behavior During Therapy WFL for tasks assessed/performed                      Past Medical History:  Diagnosis Date   Asthma    Cataract    COPD (chronic obstructive pulmonary disease) (HCC)    GERD (gastroesophageal reflux disease)    Gout    History of pulmonary embolus (PE)    Hyperlipidemia    Hypertension    Osteoarthrosis    Prediabetes    Past Surgical History:  Procedure Laterality Date   BACK SURGERY     BREAST BIOPSY Left 04/08/2014   intraductal papilloma 5 mm   COLONOSCOPY WITH PROPOFOL  N/A 04/23/2020   Procedure: COLONOSCOPY WITH PROPOFOL ;  Surgeon: Irby Mannan, MD;  Location: [facility] ENDOSCOPY;  Service: Endoscopy;  Laterality: N/A;   ESOPHAGOGASTRODUODENOSCOPY (EGD) WITH PROPOFOL  N/A 04/23/2020   Procedure: ESOPHAGOGASTRODUODENOSCOPY (EGD) WITH PROPOFOL ;  Surgeon: Irby Mannan, MD;  Location: [facility] ENDOSCOPY;  Service: Endoscopy;  Laterality: N/A;   GIVENS CAPSULE STUDY N/A 01/28/2022   Procedure: GIVENS CAPSULE STUDY;  Surgeon: Luke Salaam, MD;  Location: [facility] ENDOSCOPY;  Service: Gastroenterology;  Laterality: N/A;   LUMBAR LAMINECTOMY     TONSILLECTOMY     Patient Active Problem List   Diagnosis Date Noted   New onset type 2 diabetes mellitus (HCC) 04/04/2023   Acquired trigger finger of right middle finger 07/24/2021   Acquired trigger finger of  right ring finger 07/24/2021   Trigger finger of right hand 06/26/2021   Hidradenitis suppurativa 11/12/2020   Status post gastroplasty-1980s 06/25/2020   CLE (columnar lined esophagus)    Hiatal hernia    Gastric nodule    Angiodysplasia of stomach and duodenum    Cecal polyp    Polyp of descending colon    Polyp of transverse colon    Atrial fibrillation (HCC) 06/14/2019   Current use of anticoagulant therapy 06/14/2019   Iron deficiency anemia 06/14/2019   Essential (hemorrhagic) thrombocythemia (HCC) 06/14/2019   GERD (gastroesophageal reflux disease) 04/15/2019   Chronic low back pain 04/15/2019   Right pulmonary embolus (HCC) 04/15/2019   DOE (dyspnea on exertion) 03/13/2019   Atherosclerosis of both carotid arteries 09/20/2018   Spinal stenosis of lumbar region with neurogenic claudication 09/20/2018   Aortic atherosclerosis (HCC) 09/05/2018   At high risk for falls 09/05/2018   Osteopenia 05/18/2017   Stage 3 chronic kidney disease (HCC) 04/21/2017   Morbid obesity (HCC) 04/21/2017   Trigger middle finger of left hand 02/28/2017   Chronic gouty arthropathy without tophi 11/29/2016   Hypergammaglobulinemia 11/29/2016   Osteoarthritis of both knees 11/29/2016   Bilateral primary osteoarthritis of knee 11/29/2016   Allergic rhinitis, seasonal 07/30/2014   Gout 07/30/2014   HLD (hyperlipidemia) 07/30/2014   Osteoarthritis of  knee 07/30/2014   Prediabetes 07/30/2014   Asthma, mild persistent 07/30/2014   COPD (chronic obstructive pulmonary disease) (HCC) 07/09/2008   Essential hypertension 06/22/2006    ONSET DATE: 6 months or so  REFERRING DIAG: R26.89 (ICD-10-CM) - Other abnormalities of gait and mobility  THERAPY DIAG:  Abnormality of gait and mobility  Difficulty in walking, not elsewhere classified  Muscle weakness (generalized)  Other abnormalities of gait and mobility  Unsteadiness on feet  Other lack of coordination  Rationale for Evaluation and  Treatment: Rehabilitation  SUBJECTIVE:                                                                                                                                                                                             SUBJECTIVE STATEMENT: Patient reports feeling pretty good so far this week. States she feels just overall weakness and the leaning forward are her two biggest concerns.    Pt accompanied by: self  PERTINENT HISTORY: Patient is a 78 year old female present today for Eval for gait/mobility due to prolonged B/L knee pain pt was hunching forward using walker (per MD referral). Patient has PMH of  2 Total knee replacement in 2024- most recent left knee in October 2024. States she did receive HHPT but no outpatient PT for either knee.   PAIN:  Are you having pain? No  PRECAUTIONS: Fall  RED FLAGS: None   WEIGHT BEARING RESTRICTIONS: No  FALLS: Has patient fallen in last 6 months? No   LIVING ENVIRONMENT: Lives with: lives alone Lives in: House/apartment Stairs: Yes: External: 4 steps; on right going up Has following equipment at home: Single point cane and Walker - 2 wheeled  PLOF: Independent  PATIENT GOALS: I want to be able to stand up straighter and walk without walker  OBJECTIVE:  Note: Objective measures were completed at Evaluation unless otherwise noted.  DIAGNOSTIC FINDINGS: no recent imaging of knee available in chart.  COGNITION: Overall cognitive status: Within functional limits for tasks assessed   SENSATION: WFL  COORDINATION: WNL  EDEMA:  None observed  POSTURE: rounded shoulders, forward head, and increased thoracic kyphosis  LOWER EXTREMITY ROM:     Active  Right Eval Left Eval RLE 06/06/2023 LLE 06/06/2023  Knee flexion 108 97 110 106  Knee extension       (Blank rows = not tested)  LOWER EXTREMITY MMT:    MMT Right Eval Left Eval  Hip flexion 4- 4-  Hip extension    Hip abduction 4- 4-  Hip adduction    Hip  internal rotation 4- 4  Hip external rotation 4 4  Knee flexion 4 4  Knee extension 4 4  Ankle dorsiflexion 4   Ankle plantarflexion    Ankle inversion    Ankle eversion    (Blank rows = not tested)  TRANSFERS: Assistive device utilized: Single point cane  Sit to stand: Modified independence Stand to sit: Modified independence Chair to chair: Modified independence Floor: Not assessed  GAIT: Gait pattern: step to pattern, decreased arm swing- Right, decreased arm swing- Left, decreased step length- Right, decreased step length- Left, decreased stance time- Right, decreased stance time- Left, and decreased stride length Distance walked: < 100 feet Assistive device utilized: Single point cane Level of assistance: SBA Comments: forward trunk posture, minimal step length bilaterally  FUNCTIONAL TESTS:  5 times sit to stand: 30.63 sec with BUE Support Timed up and go (TUG): 28.1 sec with SPC 10 Meter walk test= 25.07 and 27.2 sec with SPC= 0.38 m/s avg  PATIENT SURVEYS:  LEFS 39/80                                                                                                                             TREATMENT DATE: 06/13/23   Therapeutic Exercise: therapeutic exercises to develop strength and endurance, range of motion and flexibility   Seated hip abd with BTB and 3# AW  2 x 10 reps Seated ham curl with BTB 2 x 10 reps   Therapeutic Activities: dynamic therapeutic activities  designed to achieve improved functional performance  --Seated hip march (to assist with ability to lift leg to cross/put shoes on) 3# 2 x 10  Seated knee ext 3# 2 x 10 (to strengthen quads for improved transfers) - Step tap with 1 UE support x 20 reps  - Resistive Gait with cane- 170 feet with 3#AW- short reciprocal steps with VC for erect posture x 2 trials   NMR:  -Standing lumbar flex into Lumbar ext 2 x 10 Reps -Standing erect wall posture stretch 60 sec x 2    PATIENT  EDUCATION: Education details: Purpose of PT; Discussion of post op knee rehab; purpose of functional outcome measure and goals; Plan of care Person educated: Patient Education method: Explanation Education comprehension: verbalized understanding  HOME EXERCISE PROGRAM: Access Code: [email or web address] URL: [email or web address] Date: 05/03/2023 Prepared by: Marina  Moser  Exercises - Standing March with Counter Support  - 1 x daily - 7 x weekly - 2 sets - 10 reps - 5 hold - Seated Long Arc Quad  - 1 x daily - 7 x weekly - 2 sets - 10 reps - 5 hold - Proper Sit to Stand Technique  - 1 x daily - 7 x weekly - 2 sets - 10 reps - 5 hold  GOALS: Goals reviewed with patient? Yes  SHORT TERM GOALS: Target date: 06/09/2023  Pt will be independent with HEP in order to increase strength and improve  function at home and work Baseline: 06/06/2023- Patient reports good understanding of current HEP and no  questions today. Goal status: MET  LONG TERM GOALS: Target date: 07/21/2023  Pt will increase LEFS by at least 9 points in order to demonstrate significant improvement in lower extremity function. Baseline: EVAL= 39/80 Goal status: INITIAL  2.  Patient will demonstrate improved bilateral knee active knee flex to > 115 deg for improve step ability, don/doff clothes, and ability to get in/out tub. Baseline: EVAL= R-108 deg and L-97 deg; 06/06/2023= 110 deg and 106 deg Goal status: PROGRESSING   3.  Pt will decrease 5TSTS by at least 10 seconds in order to demonstrate clinically significant improvement in LE strength. Baseline: EVAL= 30.63 with BUE Support; 06/06/2023= 14.87 sec with BUE Support and still unable to stand without UE Support Goal status: PROGRESSING  4.  Pt will improve BERG by at least 3 points in order to demonstrate clinically significant improvement in balance.   Baseline: EVAL=To be assessed next visit 4/8: 29; 06/06/2023= 42/56 Goal status: PROGRESSING- will keep goal  active to ensure consistency.  5.  Pt will decrease TUG to below 14 seconds/decrease in order to demonstrate decreased fall risk. Baseline: EVAL= 28.1 sec with SPC; 06/06/2023=22.32 sec avg using SPC  Goal status: PROGRESSING  6.  Pt will increase by at least 0.13 m/s in order to demonstrate clinically significant improvement in community ambulation.  Baseline: EVAL= 0.38 m/s with SPC; 06/06/2023=0.54  Goal status: PROGRESSING  ASSESSMENT:  CLINICAL IMPRESSION: Patient presents with good overall mobility with less VC for step length today during resistive walk. She was more aware of her forward flexed posture with walking and required less VC to look ahead. She was fatigued again with resistive activities yet no pain reported and patient stated "Im tired but feel like I worked hard."  Pt continues to demonstrate progress toward goals AEB progression of interventions this date either in volume or intensity.   OBJECTIVE IMPAIRMENTS: Abnormal gait, decreased activity tolerance, decreased balance, decreased coordination, decreased endurance, decreased mobility, difficulty walking, decreased ROM, decreased strength, and hypomobility.   ACTIVITY LIMITATIONS: carrying, lifting, bending, sitting, standing, squatting, stairs, and transfers  PARTICIPATION LIMITATIONS: meal prep, cleaning, laundry, shopping, community activity, and yard work  PERSONAL FACTORS: 1-2 comorbidities: COPD, HTN are also affecting patient's functional outcome.   REHAB POTENTIAL: Good  CLINICAL DECISION MAKING: Evolving/moderate complexity  EVALUATION COMPLEXITY: Moderate  PLAN:  PT FREQUENCY: 1-2x/week  PT DURATION: 12 weeks  PLANNED INTERVENTIONS: 97164- PT Re-evaluation, 97110-Therapeutic exercises, 97530- Therapeutic activity, [id number]- Neuromuscular re-education, 97535- Self Care, 47829- Manual therapy, [id number]- Gait training, [phone]- Orthotic Fit/training, [phone]- Canalith repositioning, [phone]- Electrical  stimulation (manual), Patient/Family education, Balance training, Stair training, Taping, Dry Needling, Joint mobilization, Joint manipulation, Scar mobilization, Vestibular training, DME instructions, Cryotherapy, and Moist heat  PLAN FOR NEXT SESSION:   Continue with resistive LE strengthening Dynamic balance- stepping over objects, turning, retro walk Continue with functional endurance based activities.  Continue with postural activities.    Murlene Army, PT 06/13/2023, 9:49 AM  9:49 AM, 06/13/23

## 2023-06-13 ENCOUNTER — Ambulatory Visit

## 2023-06-13 DIAGNOSIS — R262 Difficulty in walking, not elsewhere classified: Secondary | ICD-10-CM

## 2023-06-13 DIAGNOSIS — M6281 Muscle weakness (generalized): Secondary | ICD-10-CM

## 2023-06-13 DIAGNOSIS — R2681 Unsteadiness on feet: Secondary | ICD-10-CM

## 2023-06-13 DIAGNOSIS — R269 Unspecified abnormalities of gait and mobility: Secondary | ICD-10-CM | POA: Diagnosis not present

## 2023-06-13 DIAGNOSIS — R2689 Other abnormalities of gait and mobility: Secondary | ICD-10-CM

## 2023-06-13 DIAGNOSIS — R278 Other lack of coordination: Secondary | ICD-10-CM

## 2023-06-15 ENCOUNTER — Ambulatory Visit

## 2023-06-15 DIAGNOSIS — R262 Difficulty in walking, not elsewhere classified: Secondary | ICD-10-CM

## 2023-06-15 DIAGNOSIS — R269 Unspecified abnormalities of gait and mobility: Secondary | ICD-10-CM | POA: Diagnosis not present

## 2023-06-15 DIAGNOSIS — R278 Other lack of coordination: Secondary | ICD-10-CM

## 2023-06-15 DIAGNOSIS — R2689 Other abnormalities of gait and mobility: Secondary | ICD-10-CM

## 2023-06-15 DIAGNOSIS — M6281 Muscle weakness (generalized): Secondary | ICD-10-CM

## 2023-06-15 DIAGNOSIS — R2681 Unsteadiness on feet: Secondary | ICD-10-CM

## 2023-06-15 NOTE — Therapy (Signed)
 OUTPATIENT PHYSICAL THERAPY NEURO TREATMENT   Patient Name: Colleen [name] MRN: [id number] DOB:Aug 19, 1945, 78 y.o., female Today's Date: 06/15/2023   PCP: Adeline Hone, PA-C REFERRING PROVIDER: Adeline Hone, PA-C  END OF SESSION:  PT End of Session - 06/15/23 0831     Visit Number 13    Number of Visits 24    Date for PT Re-Evaluation 07/21/23    Progress Note Due on Visit 20    PT Start Time 0833    PT Stop Time 0913    PT Time Calculation (min) 40 min    Equipment Utilized During Treatment Gait belt    Activity Tolerance Patient tolerated treatment well;No increased pain    Behavior During Therapy WFL for tasks assessed/performed                      Past Medical History:  Diagnosis Date   Asthma    Cataract    COPD (chronic obstructive pulmonary disease) (HCC)    GERD (gastroesophageal reflux disease)    Gout    History of pulmonary embolus (PE)    Hyperlipidemia    Hypertension    Osteoarthrosis    Prediabetes    Past Surgical History:  Procedure Laterality Date   BACK SURGERY     BREAST BIOPSY Left 04/08/2014   intraductal papilloma 5 mm   COLONOSCOPY WITH PROPOFOL  N/A 04/23/2020   Procedure: COLONOSCOPY WITH PROPOFOL ;  Surgeon: Irby Mannan, MD;  Location: [facility] ENDOSCOPY;  Service: Endoscopy;  Laterality: N/A;   ESOPHAGOGASTRODUODENOSCOPY (EGD) WITH PROPOFOL  N/A 04/23/2020   Procedure: ESOPHAGOGASTRODUODENOSCOPY (EGD) WITH PROPOFOL ;  Surgeon: Irby Mannan, MD;  Location: [facility] ENDOSCOPY;  Service: Endoscopy;  Laterality: N/A;   GIVENS CAPSULE STUDY N/A 01/28/2022   Procedure: GIVENS CAPSULE STUDY;  Surgeon: Luke Salaam, MD;  Location: [facility] ENDOSCOPY;  Service: Gastroenterology;  Laterality: N/A;   LUMBAR LAMINECTOMY     TONSILLECTOMY     Patient Active Problem List   Diagnosis Date Noted   New onset type 2 diabetes mellitus (HCC) 04/04/2023   Acquired trigger finger of right middle finger 07/24/2021   Acquired trigger finger of  right ring finger 07/24/2021   Trigger finger of right hand 06/26/2021   Hidradenitis suppurativa 11/12/2020   Status post gastroplasty-1980s 06/25/2020   CLE (columnar lined esophagus)    Hiatal hernia    Gastric nodule    Angiodysplasia of stomach and duodenum    Cecal polyp    Polyp of descending colon    Polyp of transverse colon    Atrial fibrillation (HCC) 06/14/2019   Current use of anticoagulant therapy 06/14/2019   Iron deficiency anemia 06/14/2019   Essential (hemorrhagic) thrombocythemia (HCC) 06/14/2019   GERD (gastroesophageal reflux disease) 04/15/2019   Chronic low back pain 04/15/2019   Right pulmonary embolus (HCC) 04/15/2019   DOE (dyspnea on exertion) 03/13/2019   Atherosclerosis of both carotid arteries 09/20/2018   Spinal stenosis of lumbar region with neurogenic claudication 09/20/2018   Aortic atherosclerosis (HCC) 09/05/2018   At high risk for falls 09/05/2018   Osteopenia 05/18/2017   Stage 3 chronic kidney disease (HCC) 04/21/2017   Morbid obesity (HCC) 04/21/2017   Trigger middle finger of left hand 02/28/2017   Chronic gouty arthropathy without tophi 11/29/2016   Hypergammaglobulinemia 11/29/2016   Osteoarthritis of both knees 11/29/2016   Bilateral primary osteoarthritis of knee 11/29/2016   Allergic rhinitis, seasonal 07/30/2014   Gout 07/30/2014   HLD (hyperlipidemia) 07/30/2014   Osteoarthritis of  knee 07/30/2014   Prediabetes 07/30/2014   Asthma, mild persistent 07/30/2014   COPD (chronic obstructive pulmonary disease) (HCC) 07/09/2008   Essential hypertension 06/22/2006    ONSET DATE: 6 months or so  REFERRING DIAG: R26.89 (ICD-10-CM) - Other abnormalities of gait and mobility  THERAPY DIAG:  Abnormality of gait and mobility  Difficulty in walking, not elsewhere classified  Muscle weakness (generalized)  Unsteadiness on feet  Other abnormalities of gait and mobility  Other lack of coordination  Rationale for Evaluation and  Treatment: Rehabilitation  SUBJECTIVE:                                                                                                                                                                                             SUBJECTIVE STATEMENT: Patient reports feeling pretty good so far this week. States she feels just overall weakness and the leaning forward are her two biggest concerns.    Pt accompanied by: self  PERTINENT HISTORY: Patient is a 78 year old female present today for Eval for gait/mobility due to prolonged B/L knee pain pt was hunching forward using walker (per MD referral). Patient has PMH of  2 Total knee replacement in 2024- most recent left knee in October 2024. States she did receive HHPT but no outpatient PT for either knee.   PAIN:  Are you having pain? No  PRECAUTIONS: Fall  RED FLAGS: None   WEIGHT BEARING RESTRICTIONS: No  FALLS: Has patient fallen in last 6 months? No   LIVING ENVIRONMENT: Lives with: lives alone Lives in: House/apartment Stairs: Yes: External: 4 steps; on right going up Has following equipment at home: Single point cane and Walker - 2 wheeled  PLOF: Independent  PATIENT GOALS: I want to be able to stand up straighter and walk without walker  OBJECTIVE:  Note: Objective measures were completed at Evaluation unless otherwise noted.  DIAGNOSTIC FINDINGS: no recent imaging of knee available in chart.  COGNITION: Overall cognitive status: Within functional limits for tasks assessed   SENSATION: WFL  COORDINATION: WNL  EDEMA:  None observed  POSTURE: rounded shoulders, forward head, and increased thoracic kyphosis  LOWER EXTREMITY ROM:     Active  Right Eval Left Eval RLE 06/06/2023 LLE 06/06/2023  Knee flexion 108 97 110 106  Knee extension       (Blank rows = not tested)  LOWER EXTREMITY MMT:    MMT Right Eval Left Eval  Hip flexion 4- 4-  Hip extension    Hip abduction 4- 4-  Hip adduction    Hip  internal rotation 4- 4  Hip external rotation 4 4  Knee flexion 4 4  Knee extension 4 4  Ankle dorsiflexion 4   Ankle plantarflexion    Ankle inversion    Ankle eversion    (Blank rows = not tested)  TRANSFERS: Assistive device utilized: Single point cane  Sit to stand: Modified independence Stand to sit: Modified independence Chair to chair: Modified independence Floor: Not assessed  GAIT: Gait pattern: step to pattern, decreased arm swing- Right, decreased arm swing- Left, decreased step length- Right, decreased step length- Left, decreased stance time- Right, decreased stance time- Left, and decreased stride length Distance walked: < 100 feet Assistive device utilized: Single point cane Level of assistance: SBA Comments: forward trunk posture, minimal step length bilaterally  FUNCTIONAL TESTS:  5 times sit to stand: 30.63 sec with BUE Support Timed up and go (TUG): 28.1 sec with SPC 10 Meter walk test= 25.07 and 27.2 sec with SPC= 0.38 m/s avg  PATIENT SURVEYS:  LEFS 39/80                                                                                                                             TREATMENT DATE: 06/15/23     Therapeutic Activities: dynamic therapeutic activities  designed to achieve improved functional performance   Standing in // bars:  Hip march walk fwd/ retro step bwd x 8 Ham curl walk- down/back x 8 Side step squat in // bars- down and back x 2 (slow and steady with repetitive verbal and visual cues).  Toe walk x 3 (down and back)  Heel walk x 3 (down and back)  Sit to stand x 10 reps   NMR:  -Scap retraction- Matrix cable system 7.5# x 12; 12.5# x 10 reps -Standing lumbar flex into Lumbar ext  active x 10; w BTB x 10 Reps -Standing erect wall posture stretch 60 sec x 2    PATIENT EDUCATION: Education details: Purpose of PT; Discussion of post op knee rehab; purpose of functional outcome measure and goals; Plan of care Person educated:  Patient Education method: Explanation Education comprehension: verbalized understanding  HOME EXERCISE PROGRAM: Access Code: [email or web address] URL: [email or web address] Date: 05/03/2023 Prepared by: Marina  Moser  Exercises - Standing March with Counter Support  - 1 x daily - 7 x weekly - 2 sets - 10 reps - 5 hold - Seated Long Arc Quad  - 1 x daily - 7 x weekly - 2 sets - 10 reps - 5 hold - Proper Sit to Stand Technique  - 1 x daily - 7 x weekly - 2 sets - 10 reps - 5 hold  GOALS: Goals reviewed with patient? Yes  SHORT TERM GOALS: Target date: 06/09/2023  Pt will be independent with HEP in order to increase strength and improve  function at home and work Baseline: 06/06/2023- Patient reports good understanding of current HEP and no questions today. Goal status: MET  LONG TERM GOALS: Target date: 07/21/2023  Pt will increase LEFS by at least  9 points in order to demonstrate significant improvement in lower extremity function. Baseline: EVAL= 39/80 Goal status: INITIAL  2.  Patient will demonstrate improved bilateral knee active knee flex to > 115 deg for improve step ability, don/doff clothes, and ability to get in/out tub. Baseline: EVAL= R-108 deg and L-97 deg; 06/06/2023= 110 deg and 106 deg Goal status: PROGRESSING   3.  Pt will decrease 5TSTS by at least 10 seconds in order to demonstrate clinically significant improvement in LE strength. Baseline: EVAL= 30.63 with BUE Support; 06/06/2023= 14.87 sec with BUE Support and still unable to stand without UE Support Goal status: PROGRESSING  4.  Pt will improve BERG by at least 3 points in order to demonstrate clinically significant improvement in balance.   Baseline: EVAL=To be assessed next visit 4/8: 29; 06/06/2023= 42/56 Goal status: PROGRESSING- will keep goal active to ensure consistency.  5.  Pt will decrease TUG to below 14 seconds/decrease in order to demonstrate decreased fall risk. Baseline: EVAL= 28.1 sec with  SPC; 06/06/2023=22.32 sec avg using SPC  Goal status: PROGRESSING  6.  Pt will increase by at least 0.13 m/s in order to demonstrate clinically significant improvement in community ambulation.  Baseline: EVAL= 0.38 m/s with SPC; 06/06/2023=0.54  Goal status: PROGRESSING  ASSESSMENT:  CLINICAL IMPRESSION: Patient reports feeling good initially- and no pain during visit but visibly fatigued after session. She was responsive to resistive lumbar ext at wall and able to complete with minimal cues. She demonstrated increased time on her feet performing various dynamic activities with fatigue most limiting factor. Working in // bars was helpful as feedback for more erect posture today.   Pt continues to demonstrate progress toward goals AEB progression of interventions this date either in volume or intensity.   OBJECTIVE IMPAIRMENTS: Abnormal gait, decreased activity tolerance, decreased balance, decreased coordination, decreased endurance, decreased mobility, difficulty walking, decreased ROM, decreased strength, and hypomobility.   ACTIVITY LIMITATIONS: carrying, lifting, bending, sitting, standing, squatting, stairs, and transfers  PARTICIPATION LIMITATIONS: meal prep, cleaning, laundry, shopping, community activity, and yard work  PERSONAL FACTORS: 1-2 comorbidities: COPD, HTN are also affecting patient's functional outcome.   REHAB POTENTIAL: Good  CLINICAL DECISION MAKING: Evolving/moderate complexity  EVALUATION COMPLEXITY: Moderate  PLAN:  PT FREQUENCY: 1-2x/week  PT DURATION: 12 weeks  PLANNED INTERVENTIONS: 97164- PT Re-evaluation, 97110-Therapeutic exercises, 97530- Therapeutic activity, [id number]- Neuromuscular re-education, 97535- Self Care, 16109- Manual therapy, [id number]- Gait training, [phone]- Orthotic Fit/training, [phone]- Canalith repositioning, [phone]- Electrical stimulation (manual), Patient/Family education, Balance training, Stair training, Taping, Dry Needling, Joint  mobilization, Joint manipulation, Scar mobilization, Vestibular training, DME instructions, Cryotherapy, and Moist heat  PLAN FOR NEXT SESSION:   Continue with resistive LE strengthening Dynamic balance- stepping over objects, turning, retro walk Continue with functional endurance based activities.  Continue with postural activities.    Murlene Army, PT 06/15/2023, 9:23 AM  9:23 AM, 06/15/23

## 2023-06-21 NOTE — Therapy (Signed)
 OUTPATIENT PHYSICAL THERAPY NEURO TREATMENT   Patient Name: Colleen [name] MRN: [id number] DOB:1945-08-17, 78 y.o., female Today's Date: 06/22/2023   PCP: Adeline Hone, PA-C REFERRING PROVIDER: Adeline Hone, PA-C  END OF SESSION:  PT End of Session - 06/22/23 0853     Visit Number 14    Number of Visits 24    Date for PT Re-Evaluation 07/21/23    Progress Note Due on Visit 20    PT Start Time 0845    PT Stop Time 0926    PT Time Calculation (min) 41 min    Equipment Utilized During Treatment Gait belt    Activity Tolerance Patient tolerated treatment well;No increased pain    Behavior During Therapy WFL for tasks assessed/performed                       Past Medical History:  Diagnosis Date   Asthma    Cataract    COPD (chronic obstructive pulmonary disease) (HCC)    GERD (gastroesophageal reflux disease)    Gout    History of pulmonary embolus (PE)    Hyperlipidemia    Hypertension    Osteoarthrosis    Prediabetes    Past Surgical History:  Procedure Laterality Date   BACK SURGERY     BREAST BIOPSY Left 04/08/2014   intraductal papilloma 5 mm   COLONOSCOPY WITH PROPOFOL  N/A 04/23/2020   Procedure: COLONOSCOPY WITH PROPOFOL ;  Surgeon: Irby Mannan, MD;  Location: [facility] ENDOSCOPY;  Service: Endoscopy;  Laterality: N/A;   ESOPHAGOGASTRODUODENOSCOPY (EGD) WITH PROPOFOL  N/A 04/23/2020   Procedure: ESOPHAGOGASTRODUODENOSCOPY (EGD) WITH PROPOFOL ;  Surgeon: Irby Mannan, MD;  Location: [facility] ENDOSCOPY;  Service: Endoscopy;  Laterality: N/A;   GIVENS CAPSULE STUDY N/A 01/28/2022   Procedure: GIVENS CAPSULE STUDY;  Surgeon: Luke Salaam, MD;  Location: [facility] ENDOSCOPY;  Service: Gastroenterology;  Laterality: N/A;   LUMBAR LAMINECTOMY     TONSILLECTOMY     Patient Active Problem List   Diagnosis Date Noted   New onset type 2 diabetes mellitus (HCC) 04/04/2023   Acquired trigger finger of right middle finger 07/24/2021   Acquired trigger finger of  right ring finger 07/24/2021   Trigger finger of right hand 06/26/2021   Hidradenitis suppurativa 11/12/2020   Status post gastroplasty-1980s 06/25/2020   CLE (columnar lined esophagus)    Hiatal hernia    Gastric nodule    Angiodysplasia of stomach and duodenum    Cecal polyp    Polyp of descending colon    Polyp of transverse colon    Atrial fibrillation (HCC) 06/14/2019   Current use of anticoagulant therapy 06/14/2019   Iron deficiency anemia 06/14/2019   Essential (hemorrhagic) thrombocythemia (HCC) 06/14/2019   GERD (gastroesophageal reflux disease) 04/15/2019   Chronic low back pain 04/15/2019   Right pulmonary embolus (HCC) 04/15/2019   DOE (dyspnea on exertion) 03/13/2019   Atherosclerosis of both carotid arteries 09/20/2018   Spinal stenosis of lumbar region with neurogenic claudication 09/20/2018   Aortic atherosclerosis (HCC) 09/05/2018   At high risk for falls 09/05/2018   Osteopenia 05/18/2017   Stage 3 chronic kidney disease (HCC) 04/21/2017   Morbid obesity (HCC) 04/21/2017   Trigger middle finger of left hand 02/28/2017   Chronic gouty arthropathy without tophi 11/29/2016   Hypergammaglobulinemia 11/29/2016   Osteoarthritis of both knees 11/29/2016   Bilateral primary osteoarthritis of knee 11/29/2016   Allergic rhinitis, seasonal 07/30/2014   Gout 07/30/2014   HLD (hyperlipidemia) 07/30/2014   Osteoarthritis  of knee 07/30/2014   Prediabetes 07/30/2014   Asthma, mild persistent 07/30/2014   COPD (chronic obstructive pulmonary disease) (HCC) 07/09/2008   Essential hypertension 06/22/2006    ONSET DATE: 6 months or so  REFERRING DIAG: R26.89 (ICD-10-CM) - Other abnormalities of gait and mobility  THERAPY DIAG:  Abnormality of gait and mobility  Difficulty in walking, not elsewhere classified  Muscle weakness (generalized)  Unsteadiness on feet  Other abnormalities of gait and mobility  Other lack of coordination  Rationale for Evaluation and  Treatment: Rehabilitation  SUBJECTIVE:                                                                                                                                                                                             SUBJECTIVE STATEMENT: Patient reports feeling pretty good so far this week. States she feels just overall weakness and the leaning forward are her two biggest concerns.    Pt accompanied by: self  PERTINENT HISTORY: Patient is a 78 year old female present today for Eval for gait/mobility due to prolonged B/L knee pain pt was hunching forward using walker (per MD referral). Patient has PMH of  2 Total knee replacement in 2024- most recent left knee in October 2024. States she did receive HHPT but no outpatient PT for either knee.   PAIN:  Are you having pain? No  PRECAUTIONS: Fall  RED FLAGS: None   WEIGHT BEARING RESTRICTIONS: No  FALLS: Has patient fallen in last 6 months? No   LIVING ENVIRONMENT: Lives with: lives alone Lives in: House/apartment Stairs: Yes: External: 4 steps; on right going up Has following equipment at home: Single point cane and Walker - 2 wheeled  PLOF: Independent  PATIENT GOALS: I want to be able to stand up straighter and walk without walker  OBJECTIVE:  Note: Objective measures were completed at Evaluation unless otherwise noted.  DIAGNOSTIC FINDINGS: no recent imaging of knee available in chart.  COGNITION: Overall cognitive status: Within functional limits for tasks assessed   SENSATION: WFL  COORDINATION: WNL  EDEMA:  None observed  POSTURE: rounded shoulders, forward head, and increased thoracic kyphosis  LOWER EXTREMITY ROM:     Active  Right Eval Left Eval RLE 06/06/2023 LLE 06/06/2023  Knee flexion 108 97 110 106  Knee extension       (Blank rows = not tested)  LOWER EXTREMITY MMT:    MMT Right Eval Left Eval  Hip flexion 4- 4-  Hip extension    Hip abduction 4- 4-  Hip adduction    Hip  internal rotation 4- 4  Hip external rotation 4  4  Knee flexion 4 4  Knee extension 4 4  Ankle dorsiflexion 4   Ankle plantarflexion    Ankle inversion    Ankle eversion    (Blank rows = not tested)  TRANSFERS: Assistive device utilized: Single point cane  Sit to stand: Modified independence Stand to sit: Modified independence Chair to chair: Modified independence Floor: Not assessed  GAIT: Gait pattern: step to pattern, decreased arm swing- Right, decreased arm swing- Left, decreased step length- Right, decreased step length- Left, decreased stance time- Right, decreased stance time- Left, and decreased stride length Distance walked: < 100 feet Assistive device utilized: Single point cane Level of assistance: SBA Comments: forward trunk posture, minimal step length bilaterally  FUNCTIONAL TESTS:  5 times sit to stand: 30.63 sec with BUE Support Timed up and go (TUG): 28.1 sec with SPC 10 Meter walk test= 25.07 and 27.2 sec with SPC= 0.38 m/s avg  PATIENT SURVEYS:  LEFS 39/80                                                                                                                             TREATMENT DATE: 06/22/23     Therapeutic Activities: dynamic therapeutic activities  designed to achieve improved functional performance   Standing in // bars:  Hip march walk fwd/ retro step bwd x 5 using 3# AW Step up 3# AW x 12 reps each LE Ham curl walk- down/back x 5 using 3AW (VC for specific technique)  Side step squat in // bars- down and back x 2 using 3# (slow and steady with repetitive verbal and visual cues).  Resistive gait with 3# AW and multipoint cane x 175 feet (Focusing on erect posture)  Sit to stand  2 sets x 10 reps from edge of mat (approx 22 in) Bridging x 15 reps (VC for technique) - no pain reported.      PATIENT EDUCATION: Education details: Purpose of PT; Discussion of post op knee rehab; purpose of functional outcome measure and goals; Plan  of care Person educated: Patient Education method: Explanation Education comprehension: verbalized understanding  HOME EXERCISE PROGRAM: Access Code: [email or web address] URL: [email or web address] Date: 05/03/2023 Prepared by: Marina  Moser  Exercises - Standing March with Counter Support  - 1 x daily - 7 x weekly - 2 sets - 10 reps - 5 hold - Seated Long Arc Quad  - 1 x daily - 7 x weekly - 2 sets - 10 reps - 5 hold - Proper Sit to Stand Technique  - 1 x daily - 7 x weekly - 2 sets - 10 reps - 5 hold  GOALS: Goals reviewed with patient? Yes  SHORT TERM GOALS: Target date: 06/09/2023  Pt will be independent with HEP in order to increase strength and improve  function at home and work Baseline: 06/06/2023- Patient reports good understanding of current HEP and no questions today. Goal status: MET  LONG TERM GOALS: Target date: 07/21/2023  Pt  will increase LEFS by at least 9 points in order to demonstrate significant improvement in lower extremity function. Baseline: EVAL= 39/80 Goal status: INITIAL  2.  Patient will demonstrate improved bilateral knee active knee flex to > 115 deg for improve step ability, don/doff clothes, and ability to get in/out tub. Baseline: EVAL= R-108 deg and L-97 deg; 06/06/2023= 110 deg and 106 deg Goal status: PROGRESSING   3.  Pt will decrease 5TSTS by at least 10 seconds in order to demonstrate clinically significant improvement in LE strength. Baseline: EVAL= 30.63 with BUE Support; 06/06/2023= 14.87 sec with BUE Support and still unable to stand without UE Support Goal status: PROGRESSING  4.  Pt will improve BERG by at least 3 points in order to demonstrate clinically significant improvement in balance.   Baseline: EVAL=To be assessed next visit 4/8: 29; 06/06/2023= 42/56 Goal status: PROGRESSING- will keep goal active to ensure consistency.  5.  Pt will decrease TUG to below 14 seconds/decrease in order to demonstrate decreased fall  risk. Baseline: EVAL= 28.1 sec with SPC; 06/06/2023=22.32 sec avg using SPC  Goal status: PROGRESSING  6.  Pt will increase by at least 0.13 m/s in order to demonstrate clinically significant improvement in community ambulation.  Baseline: EVAL= 0.38 m/s with SPC; 06/06/2023=0.54  Goal status: PROGRESSING  ASSESSMENT:  CLINICAL IMPRESSION: Patient able to resume using more resistance with all activities today. She was able to progress with 3# without only complaint as fatigue. Still requires verbal cues for optimal erect posture with walking. She was able to perform bridge today without any complaint of pain. She also required less verbal cues for sit to stand techniques.   Pt will benefit from PT services to address deficits in strength, mobility, and pain in order to return to full function at home with less knee pain.   OBJECTIVE IMPAIRMENTS: Abnormal gait, decreased activity tolerance, decreased balance, decreased coordination, decreased endurance, decreased mobility, difficulty walking, decreased ROM, decreased strength, and hypomobility.   ACTIVITY LIMITATIONS: carrying, lifting, bending, sitting, standing, squatting, stairs, and transfers  PARTICIPATION LIMITATIONS: meal prep, cleaning, laundry, shopping, community activity, and yard work  PERSONAL FACTORS: 1-2 comorbidities: COPD, HTN are also affecting patient's functional outcome.   REHAB POTENTIAL: Good  CLINICAL DECISION MAKING: Evolving/moderate complexity  EVALUATION COMPLEXITY: Moderate  PLAN:  PT FREQUENCY: 1-2x/week  PT DURATION: 12 weeks  PLANNED INTERVENTIONS: 97164- PT Re-evaluation, 97110-Therapeutic exercises, 97530- Therapeutic activity, [id number]- Neuromuscular re-education, 97535- Self Care, 25956- Manual therapy, [id number]- Gait training, [phone]- Orthotic Fit/training, [phone]- Canalith repositioning, [phone]- Electrical stimulation (manual), Patient/Family education, Balance training, Stair training, Taping, Dry  Needling, Joint mobilization, Joint manipulation, Scar mobilization, Vestibular training, DME instructions, Cryotherapy, and Moist heat  PLAN FOR NEXT SESSION:   Continue with resistive LE strengthening Dynamic balance- stepping over objects, turning, retro walk Continue with functional endurance based activities.  Continue with postural activities.    Murlene Army, PT 06/22/2023, 9:54 AM  9:54 AM, 06/22/23

## 2023-06-22 ENCOUNTER — Ambulatory Visit

## 2023-06-22 DIAGNOSIS — R269 Unspecified abnormalities of gait and mobility: Secondary | ICD-10-CM | POA: Diagnosis not present

## 2023-06-22 DIAGNOSIS — R2689 Other abnormalities of gait and mobility: Secondary | ICD-10-CM

## 2023-06-22 DIAGNOSIS — R278 Other lack of coordination: Secondary | ICD-10-CM

## 2023-06-22 DIAGNOSIS — R2681 Unsteadiness on feet: Secondary | ICD-10-CM

## 2023-06-22 DIAGNOSIS — M6281 Muscle weakness (generalized): Secondary | ICD-10-CM

## 2023-06-22 DIAGNOSIS — R262 Difficulty in walking, not elsewhere classified: Secondary | ICD-10-CM

## 2023-06-24 ENCOUNTER — Ambulatory Visit

## 2023-06-24 DIAGNOSIS — M6281 Muscle weakness (generalized): Secondary | ICD-10-CM

## 2023-06-24 DIAGNOSIS — R2681 Unsteadiness on feet: Secondary | ICD-10-CM

## 2023-06-24 DIAGNOSIS — R269 Unspecified abnormalities of gait and mobility: Secondary | ICD-10-CM | POA: Diagnosis not present

## 2023-06-24 DIAGNOSIS — R278 Other lack of coordination: Secondary | ICD-10-CM

## 2023-06-24 DIAGNOSIS — R2689 Other abnormalities of gait and mobility: Secondary | ICD-10-CM

## 2023-06-24 DIAGNOSIS — R262 Difficulty in walking, not elsewhere classified: Secondary | ICD-10-CM

## 2023-06-24 NOTE — Therapy (Signed)
 OUTPATIENT PHYSICAL THERAPY NEURO TREATMENT   Patient Name: Colleen [name] MRN: [id number] DOB:[birth year], 78 y.o., female Today's Date: 06/24/2023   PCP: Adeline Hone, PA-C REFERRING PROVIDER: Adeline Hone, PA-C  END OF SESSION:  PT End of Session - 06/24/23 0801     Visit Number 15    Number of Visits 24    Date for PT Re-Evaluation 07/21/23    Progress Note Due on Visit 20    PT Start Time 0758    PT Stop Time 0841    PT Time Calculation (min) 43 min    Equipment Utilized During Treatment Gait belt    Activity Tolerance Patient tolerated treatment well;No increased pain    Behavior During Therapy WFL for tasks assessed/performed                       Past Medical History:  Diagnosis Date   Asthma    Cataract    COPD (chronic obstructive pulmonary disease) (HCC)    GERD (gastroesophageal reflux disease)    Gout    History of pulmonary embolus (PE)    Hyperlipidemia    Hypertension    Osteoarthrosis    Prediabetes    Past Surgical History:  Procedure Laterality Date   BACK SURGERY     BREAST BIOPSY Left 04/08/2014   intraductal papilloma 5 mm   COLONOSCOPY WITH PROPOFOL  N/A 04/23/2020   Procedure: COLONOSCOPY WITH PROPOFOL ;  Surgeon: Irby Mannan, MD;  Location: [facility] ENDOSCOPY;  Service: Endoscopy;  Laterality: N/A;   ESOPHAGOGASTRODUODENOSCOPY (EGD) WITH PROPOFOL  N/A 04/23/2020   Procedure: ESOPHAGOGASTRODUODENOSCOPY (EGD) WITH PROPOFOL ;  Surgeon: Irby Mannan, MD;  Location: [facility] ENDOSCOPY;  Service: Endoscopy;  Laterality: N/A;   GIVENS CAPSULE STUDY N/A 01/28/2022   Procedure: GIVENS CAPSULE STUDY;  Surgeon: Luke Salaam, MD;  Location: [facility] ENDOSCOPY;  Service: Gastroenterology;  Laterality: N/A;   LUMBAR LAMINECTOMY     TONSILLECTOMY     Patient Active Problem List   Diagnosis Date Noted   New onset type 2 diabetes mellitus (HCC) 04/04/2023   Acquired trigger finger of right middle finger 07/24/2021   Acquired trigger finger of  right ring finger 07/24/2021   Trigger finger of right hand 06/26/2021   Hidradenitis suppurativa 11/12/2020   Status post gastroplasty-1980s 06/25/2020   CLE (columnar lined esophagus)    Hiatal hernia    Gastric nodule    Angiodysplasia of stomach and duodenum    Cecal polyp    Polyp of descending colon    Polyp of transverse colon    Atrial fibrillation (HCC) 06/14/2019   Current use of anticoagulant therapy 06/14/2019   Iron deficiency anemia 06/14/2019   Essential (hemorrhagic) thrombocythemia (HCC) 06/14/2019   GERD (gastroesophageal reflux disease) 04/15/2019   Chronic low back pain 04/15/2019   Right pulmonary embolus (HCC) 04/15/2019   DOE (dyspnea on exertion) 03/13/2019   Atherosclerosis of both carotid arteries 09/20/2018   Spinal stenosis of lumbar region with neurogenic claudication 09/20/2018   Aortic atherosclerosis (HCC) 09/05/2018   At high risk for falls 09/05/2018   Osteopenia 05/18/2017   Stage 3 chronic kidney disease (HCC) 04/21/2017   Morbid obesity (HCC) 04/21/2017   Trigger middle finger of left hand 02/28/2017   Chronic gouty arthropathy without tophi 11/29/2016   Hypergammaglobulinemia 11/29/2016   Osteoarthritis of both knees 11/29/2016   Bilateral primary osteoarthritis of knee 11/29/2016   Allergic rhinitis, seasonal 07/30/2014   Gout 07/30/2014   HLD (hyperlipidemia) 07/30/2014   Osteoarthritis  of knee 07/30/2014   Prediabetes 07/30/2014   Asthma, mild persistent 07/30/2014   COPD (chronic obstructive pulmonary disease) (HCC) 07/09/2008   Essential hypertension 06/22/2006    ONSET DATE: 6 months or so  REFERRING DIAG: R26.89 (ICD-10-CM) - Other abnormalities of gait and mobility  THERAPY DIAG:  Abnormality of gait and mobility  Difficulty in walking, not elsewhere classified  Muscle weakness (generalized)  Unsteadiness on feet  Other abnormalities of gait and mobility  Other lack of coordination  Rationale for Evaluation and  Treatment: Rehabilitation  SUBJECTIVE:                                                                                                                                                                                             SUBJECTIVE STATEMENT: Patient reports not too sore after last session and feeling well overall.   Pt accompanied by: self  PERTINENT HISTORY: Patient is a 78 year old female present today for Eval for gait/mobility due to prolonged B/L knee pain pt was hunching forward using walker (per MD referral). Patient has PMH of  2 Total knee replacement in 2024- most recent left knee in October 2024. States she did receive HHPT but no outpatient PT for either knee.   PAIN:  Are you having pain? No  PRECAUTIONS: Fall  RED FLAGS: None   WEIGHT BEARING RESTRICTIONS: No  FALLS: Has patient fallen in last 6 months? No   LIVING ENVIRONMENT: Lives with: lives alone Lives in: House/apartment Stairs: Yes: External: 4 steps; on right going up Has following equipment at home: Single point cane and Walker - 2 wheeled  PLOF: Independent  PATIENT GOALS: I want to be able to stand up straighter and walk without walker  OBJECTIVE:  Note: Objective measures were completed at Evaluation unless otherwise noted.  DIAGNOSTIC FINDINGS: no recent imaging of knee available in chart.  COGNITION: Overall cognitive status: Within functional limits for tasks assessed   SENSATION: WFL  COORDINATION: WNL  EDEMA:  None observed  POSTURE: rounded shoulders, forward head, and increased thoracic kyphosis  LOWER EXTREMITY ROM:     Active  Right Eval Left Eval RLE 06/06/2023 LLE 06/06/2023  Knee flexion 108 97 110 106  Knee extension       (Blank rows = not tested)  LOWER EXTREMITY MMT:    MMT Right Eval Left Eval  Hip flexion 4- 4-  Hip extension    Hip abduction 4- 4-  Hip adduction    Hip internal rotation 4- 4  Hip external rotation 4 4  Knee flexion 4 4  Knee  extension 4 4  Ankle  dorsiflexion 4   Ankle plantarflexion    Ankle inversion    Ankle eversion    (Blank rows = not tested)  TRANSFERS: Assistive device utilized: Single point cane  Sit to stand: Modified independence Stand to sit: Modified independence Chair to chair: Modified independence Floor: Not assessed  GAIT: Gait pattern: step to pattern, decreased arm swing- Right, decreased arm swing- Left, decreased step length- Right, decreased step length- Left, decreased stance time- Right, decreased stance time- Left, and decreased stride length Distance walked: < 100 feet Assistive device utilized: Single point cane Level of assistance: SBA Comments: forward trunk posture, minimal step length bilaterally  FUNCTIONAL TESTS:  5 times sit to stand: 30.63 sec with BUE Support Timed up and go (TUG): 28.1 sec with SPC 10 Meter walk test= 25.07 and 27.2 sec with SPC= 0.38 m/s avg  PATIENT SURVEYS:  LEFS 39/80                                                                                                                             TREATMENT DATE: 06/24/23     Therapeutic Activities: dynamic therapeutic activities  designed to achieve improved functional performance    NUSTEP: L1 LE Only  For Knee ROM/Strength- 5 min with total distance 0.14 mi and VC to keep SPM > 50   Step up 3# AW x 12 reps each LE  Side stepping in // with RTB around ankles - down and back x 5   Deadlift 5# dbs at wall x 10 reps (VC for technique)    Sit to stand from edge of mat without UE support -24 in x 5 -22 in x 5 -20.5 in x 5         PATIENT EDUCATION: Education details: Purpose of PT; Discussion of post op knee rehab; purpose of functional outcome measure and goals; Plan of care Person educated: Patient Education method: Explanation Education comprehension: verbalized understanding  HOME EXERCISE PROGRAM: Access Code: [email or web address] URL: [email or web address] Date:  05/03/2023 Prepared by: Marina  Moser  Exercises - Standing March with Counter Support  - 1 x daily - 7 x weekly - 2 sets - 10 reps - 5 hold - Seated Long Arc Quad  - 1 x daily - 7 x weekly - 2 sets - 10 reps - 5 hold - Proper Sit to Stand Technique  - 1 x daily - 7 x weekly - 2 sets - 10 reps - 5 hold  GOALS: Goals reviewed with patient? Yes  SHORT TERM GOALS: Target date: 06/09/2023  Pt will be independent with HEP in order to increase strength and improve  function at home and work Baseline: 06/06/2023- Patient reports good understanding of current HEP and no questions today. Goal status: MET  LONG TERM GOALS: Target date: 07/21/2023  Pt will increase LEFS by at least 9 points in order to demonstrate significant improvement in lower extremity function. Baseline: EVAL= 39/80 Goal status: INITIAL  2.  Patient  will demonstrate improved bilateral knee active knee flex to > 115 deg for improve step ability, don/doff clothes, and ability to get in/out tub. Baseline: EVAL= R-108 deg and L-97 deg; 06/06/2023= 110 deg and 106 deg Goal status: PROGRESSING   3.  Pt will decrease 5TSTS by at least 10 seconds in order to demonstrate clinically significant improvement in LE strength. Baseline: EVAL= 30.63 with BUE Support; 06/06/2023= 14.87 sec with BUE Support and still unable to stand without UE Support Goal status: PROGRESSING  4.  Pt will improve BERG by at least 3 points in order to demonstrate clinically significant improvement in balance.   Baseline: EVAL=To be assessed next visit 4/8: 29; 06/06/2023= 42/56 Goal status: PROGRESSING- will keep goal active to ensure consistency.  5.  Pt will decrease TUG to below 14 seconds/decrease in order to demonstrate decreased fall risk. Baseline: EVAL= 28.1 sec with SPC; 06/06/2023=22.32 sec avg using SPC  Goal status: PROGRESSING  6.  Pt will increase by at least 0.13 m/s in order to demonstrate clinically significant improvement in community  ambulation.  Baseline: EVAL= 0.38 m/s with SPC; 06/06/2023=0.54  Goal status: PROGRESSING  ASSESSMENT:  CLINICAL IMPRESSION: Patient able to progress overall - improving low back activities including ability to perform modified deadlift. She was able to perform Nustep for the 1st time and reported fatigued yet no pain. She is progressing with more awareness of her forward trunk lean and less cues to stand erect. Pt will benefit from PT services to address deficits in strength, mobility, and pain in order to return to full function at home with less knee pain.   OBJECTIVE IMPAIRMENTS: Abnormal gait, decreased activity tolerance, decreased balance, decreased coordination, decreased endurance, decreased mobility, difficulty walking, decreased ROM, decreased strength, and hypomobility.   ACTIVITY LIMITATIONS: carrying, lifting, bending, sitting, standing, squatting, stairs, and transfers  PARTICIPATION LIMITATIONS: meal prep, cleaning, laundry, shopping, community activity, and yard work  PERSONAL FACTORS: 1-2 comorbidities: COPD, HTN are also affecting patient's functional outcome.   REHAB POTENTIAL: Good  CLINICAL DECISION MAKING: Evolving/moderate complexity  EVALUATION COMPLEXITY: Moderate  PLAN:  PT FREQUENCY: 1-2x/week  PT DURATION: 12 weeks  PLANNED INTERVENTIONS: 97164- PT Re-evaluation, 97110-Therapeutic exercises, 97530- Therapeutic activity, [id number]- Neuromuscular re-education, 97535- Self Care, 16109- Manual therapy, [id number]- Gait training, [phone]- Orthotic Fit/training, [phone]- Canalith repositioning, [phone]- Electrical stimulation (manual), Patient/Family education, Balance training, Stair training, Taping, Dry Needling, Joint mobilization, Joint manipulation, Scar mobilization, Vestibular training, DME instructions, Cryotherapy, and Moist heat  PLAN FOR NEXT SESSION:   Continue with resistive LE strengthening Dynamic balance- stepping over objects, turning, retro walk Continue  with functional endurance based activities.  Continue with postural activities.    Murlene Army, PT 06/24/2023, 8:45 AM  8:45 AM, 06/24/23

## 2023-06-27 ENCOUNTER — Ambulatory Visit: Attending: Family Medicine

## 2023-06-27 ENCOUNTER — Emergency Department
Admission: EM | Admit: 2023-06-27 | Discharge: 2023-06-27 | Disposition: A | Discharge status: Home / Self Care | Attending: Emergency Medicine | Admitting: Emergency Medicine

## 2023-06-27 ENCOUNTER — Other Ambulatory Visit: Payer: Self-pay

## 2023-06-27 ENCOUNTER — Emergency Department

## 2023-06-27 DIAGNOSIS — M542 Cervicalgia: Secondary | ICD-10-CM | POA: Diagnosis not present

## 2023-06-27 DIAGNOSIS — R2689 Other abnormalities of gait and mobility: Secondary | ICD-10-CM | POA: Insufficient documentation

## 2023-06-27 DIAGNOSIS — R1312 Dysphagia, oropharyngeal phase: Secondary | ICD-10-CM | POA: Diagnosis not present

## 2023-06-27 DIAGNOSIS — R278 Other lack of coordination: Secondary | ICD-10-CM | POA: Insufficient documentation

## 2023-06-27 DIAGNOSIS — I10 Essential (primary) hypertension: Secondary | ICD-10-CM | POA: Insufficient documentation

## 2023-06-27 DIAGNOSIS — R262 Difficulty in walking, not elsewhere classified: Secondary | ICD-10-CM | POA: Insufficient documentation

## 2023-06-27 DIAGNOSIS — Z96653 Presence of artificial knee joint, bilateral: Secondary | ICD-10-CM | POA: Diagnosis not present

## 2023-06-27 DIAGNOSIS — R1310 Dysphagia, unspecified: Secondary | ICD-10-CM | POA: Diagnosis not present

## 2023-06-27 DIAGNOSIS — M6281 Muscle weakness (generalized): Secondary | ICD-10-CM | POA: Insufficient documentation

## 2023-06-27 DIAGNOSIS — M47812 Spondylosis without myelopathy or radiculopathy, cervical region: Secondary | ICD-10-CM | POA: Diagnosis not present

## 2023-06-27 DIAGNOSIS — R2681 Unsteadiness on feet: Secondary | ICD-10-CM | POA: Insufficient documentation

## 2023-06-27 DIAGNOSIS — R269 Unspecified abnormalities of gait and mobility: Secondary | ICD-10-CM | POA: Insufficient documentation

## 2023-06-27 MED ORDER — ALUM & MAG HYDROXIDE-SIMETH 200-200-20 MG/5ML PO SUSP
30.0000 mL | Freq: Once | ORAL | Status: AC
  Administered 2023-06-27: 30 mL via ORAL
  Filled 2023-06-27: qty 30

## 2023-06-27 MED ORDER — LIDOCAINE VISCOUS HCL 2 % MT SOLN
15.0000 mL | Freq: Once | OROMUCOSAL | Status: AC
  Administered 2023-06-27: 15 mL via ORAL
  Filled 2023-06-27: qty 15

## 2023-06-27 NOTE — Therapy (Signed)
 OUTPATIENT PHYSICAL THERAPY NEURO TREATMENT   Patient Name: Colleen [name] MRN: [id number] DOB:August 05, 1945, 78 y.o., female Today's Date: 06/27/2023   PCP: Adeline Hone, PA-C REFERRING PROVIDER: Adeline Hone, PA-C  END OF SESSION:  PT End of Session - 06/27/23 0850     Visit Number 16    Number of Visits 24    Date for PT Re-Evaluation 07/21/23    Progress Note Due on Visit 20    PT Start Time 0847    PT Stop Time 0928    PT Time Calculation (min) 41 min    Equipment Utilized During Treatment Gait belt    Activity Tolerance Patient tolerated treatment well;No increased pain    Behavior During Therapy WFL for tasks assessed/performed                        Past Medical History:  Diagnosis Date   Asthma    Cataract    COPD (chronic obstructive pulmonary disease) (HCC)    GERD (gastroesophageal reflux disease)    Gout    History of pulmonary embolus (PE)    Hyperlipidemia    Hypertension    Osteoarthrosis    Prediabetes    Past Surgical History:  Procedure Laterality Date   BACK SURGERY     BREAST BIOPSY Left 04/08/2014   intraductal papilloma 5 mm   COLONOSCOPY WITH PROPOFOL  N/A 04/23/2020   Procedure: COLONOSCOPY WITH PROPOFOL ;  Surgeon: Irby Mannan, MD;  Location: [facility] ENDOSCOPY;  Service: Endoscopy;  Laterality: N/A;   ESOPHAGOGASTRODUODENOSCOPY (EGD) WITH PROPOFOL  N/A 04/23/2020   Procedure: ESOPHAGOGASTRODUODENOSCOPY (EGD) WITH PROPOFOL ;  Surgeon: Irby Mannan, MD;  Location: [facility] ENDOSCOPY;  Service: Endoscopy;  Laterality: N/A;   GIVENS CAPSULE STUDY N/A 01/28/2022   Procedure: GIVENS CAPSULE STUDY;  Surgeon: Luke Salaam, MD;  Location: [facility] ENDOSCOPY;  Service: Gastroenterology;  Laterality: N/A;   LUMBAR LAMINECTOMY     TONSILLECTOMY     Patient Active Problem List   Diagnosis Date Noted   New onset type 2 diabetes mellitus (HCC) 04/04/2023   Acquired trigger finger of right middle finger 07/24/2021   Acquired trigger finger  of right ring finger 07/24/2021   Trigger finger of right hand 06/26/2021   Hidradenitis suppurativa 11/12/2020   Status post gastroplasty-1980s 06/25/2020   CLE (columnar lined esophagus)    Hiatal hernia    Gastric nodule    Angiodysplasia of stomach and duodenum    Cecal polyp    Polyp of descending colon    Polyp of transverse colon    Atrial fibrillation (HCC) 06/14/2019   Current use of anticoagulant therapy 06/14/2019   Iron deficiency anemia 06/14/2019   Essential (hemorrhagic) thrombocythemia (HCC) 06/14/2019   GERD (gastroesophageal reflux disease) 04/15/2019   Chronic low back pain 04/15/2019   Right pulmonary embolus (HCC) 04/15/2019   DOE (dyspnea on exertion) 03/13/2019   Atherosclerosis of both carotid arteries 09/20/2018   Spinal stenosis of lumbar region with neurogenic claudication 09/20/2018   Aortic atherosclerosis (HCC) 09/05/2018   At high risk for falls 09/05/2018   Osteopenia 05/18/2017   Stage 3 chronic kidney disease (HCC) 04/21/2017   Morbid obesity (HCC) 04/21/2017   Trigger middle finger of left hand 02/28/2017   Chronic gouty arthropathy without tophi 11/29/2016   Hypergammaglobulinemia 11/29/2016   Osteoarthritis of both knees 11/29/2016   Bilateral primary osteoarthritis of knee 11/29/2016   Allergic rhinitis, seasonal 07/30/2014   Gout 07/30/2014   HLD (hyperlipidemia) 07/30/2014  Osteoarthritis of knee 07/30/2014   Prediabetes 07/30/2014   Asthma, mild persistent 07/30/2014   COPD (chronic obstructive pulmonary disease) (HCC) 07/09/2008   Essential hypertension 06/22/2006    ONSET DATE: 6 months or so  REFERRING DIAG: R26.89 (ICD-10-CM) - Other abnormalities of gait and mobility  THERAPY DIAG:  Abnormality of gait and mobility  Difficulty in walking, not elsewhere classified  Muscle weakness (generalized)  Unsteadiness on feet  Other abnormalities of gait and mobility  Other lack of coordination  Rationale for Evaluation and  Treatment: Rehabilitation  SUBJECTIVE:                                                                                                                                                                                             SUBJECTIVE STATEMENT: Patient reports having a good weekend and denies any falls or pain today.   Pt accompanied by: self  PERTINENT HISTORY: Patient is a 78 year old female present today for Eval for gait/mobility due to prolonged B/L knee pain pt was hunching forward using walker (per MD referral). Patient has PMH of  2 Total knee replacement in 2024- most recent left knee in October 2024. States she did receive HHPT but no outpatient PT for either knee.   PAIN:  Are you having pain? No  PRECAUTIONS: Fall  RED FLAGS: None   WEIGHT BEARING RESTRICTIONS: No  FALLS: Has patient fallen in last 6 months? No   LIVING ENVIRONMENT: Lives with: lives alone Lives in: House/apartment Stairs: Yes: External: 4 steps; on right going up Has following equipment at home: Single point cane and Walker - 2 wheeled  PLOF: Independent  PATIENT GOALS: I want to be able to stand up straighter and walk without walker  OBJECTIVE:  Note: Objective measures were completed at Evaluation unless otherwise noted.  DIAGNOSTIC FINDINGS: no recent imaging of knee available in chart.  COGNITION: Overall cognitive status: Within functional limits for tasks assessed   SENSATION: WFL  COORDINATION: WNL  EDEMA:  None observed  POSTURE: rounded shoulders, forward head, and increased thoracic kyphosis  LOWER EXTREMITY ROM:     Active  Right Eval Left Eval RLE 06/06/2023 LLE 06/06/2023  Knee flexion 108 97 110 106  Knee extension       (Blank rows = not tested)  LOWER EXTREMITY MMT:    MMT Right Eval Left Eval  Hip flexion 4- 4-  Hip extension    Hip abduction 4- 4-  Hip adduction    Hip internal rotation 4- 4  Hip external rotation 4 4  Knee flexion 4 4  Knee  extension 4 4  Ankle dorsiflexion 4   Ankle plantarflexion    Ankle inversion    Ankle eversion    (Blank rows = not tested)  TRANSFERS: Assistive device utilized: Single point cane  Sit to stand: Modified independence Stand to sit: Modified independence Chair to chair: Modified independence Floor: Not assessed  GAIT: Gait pattern: step to pattern, decreased arm swing- Right, decreased arm swing- Left, decreased step length- Right, decreased step length- Left, decreased stance time- Right, decreased stance time- Left, and decreased stride length Distance walked: < 100 feet Assistive device utilized: Single point cane Level of assistance: SBA Comments: forward trunk posture, minimal step length bilaterally  FUNCTIONAL TESTS:  5 times sit to stand: 30.63 sec with BUE Support Timed up and go (TUG): 28.1 sec with SPC 10 Meter walk test= 25.07 and 27.2 sec with SPC= 0.38 m/s avg  PATIENT SURVEYS:  LEFS 39/80                                                                                                                             TREATMENT DATE: 06/27/23     Therapeutic Activities: dynamic therapeutic activities  designed to achieve improved functional performance     Dynamic high knee march in // bars (VC for step height) - down and back x 5 Retro steps in // bars (VC for large steps) - down and back x 5 Ham curl walk in // bars (VC for technique) - down and back x  Toe walk in // bars - down and back x 5  Lunge walk in // bars  (modified- mini lunge) - down and back x 5 Side step walk with BTB around knees- down and back x 5 Step up  x 12 reps each LE Sit to stand from edge of mat without UE support -24 in x 5 -22 in x 5 -20.5 in x 5 -19.5 in x 6    NMR: For posture Deadlift 5# dbs at wall  2 x 10 reps (VC for technique)  Standing side bending 5# db in each hand x 12 reps each           PATIENT EDUCATION: Education details: Purpose of PT; Discussion of  post op knee rehab; purpose of functional outcome measure and goals; Plan of care Person educated: Patient Education method: Explanation Education comprehension: verbalized understanding  HOME EXERCISE PROGRAM: Access Code: [email or web address] URL: [email or web address] Date: 05/03/2023 Prepared by: Marina  Moser  Exercises - Standing March with Counter Support  - 1 x daily - 7 x weekly - 2 sets - 10 reps - 5 hold - Seated Long Arc Quad  - 1 x daily - 7 x weekly - 2 sets - 10 reps - 5 hold - Proper Sit to Stand Technique  - 1 x daily - 7 x weekly - 2 sets - 10 reps - 5 hold  GOALS: Goals reviewed with patient? Yes  SHORT TERM GOALS: Target date: 06/09/2023  Pt will  be independent with HEP in order to increase strength and improve  function at home and work Baseline: 06/06/2023- Patient reports good understanding of current HEP and no questions today. Goal status: MET  LONG TERM GOALS: Target date: 07/21/2023  Pt will increase LEFS by at least 9 points in order to demonstrate significant improvement in lower extremity function. Baseline: EVAL= 39/80 Goal status: INITIAL  2.  Patient will demonstrate improved bilateral knee active knee flex to > 115 deg for improve step ability, don/doff clothes, and ability to get in/out tub. Baseline: EVAL= R-108 deg and L-97 deg; 06/06/2023= 110 deg and 106 deg Goal status: PROGRESSING   3.  Pt will decrease 5TSTS by at least 10 seconds in order to demonstrate clinically significant improvement in LE strength. Baseline: EVAL= 30.63 with BUE Support; 06/06/2023= 14.87 sec with BUE Support and still unable to stand without UE Support Goal status: PROGRESSING  4.  Pt will improve BERG by at least 3 points in order to demonstrate clinically significant improvement in balance.   Baseline: EVAL=To be assessed next visit 4/8: 29; 06/06/2023= 42/56 Goal status: PROGRESSING- will keep goal active to ensure consistency.  5.  Pt will decrease TUG to  below 14 seconds/decrease in order to demonstrate decreased fall risk. Baseline: EVAL= 28.1 sec with SPC; 06/06/2023=22.32 sec avg using SPC  Goal status: PROGRESSING  6.  Pt will increase by at least 0.13 m/s in order to demonstrate clinically significant improvement in community ambulation.  Baseline: EVAL= 0.38 m/s with SPC; 06/06/2023=0.54  Goal status: PROGRESSING  ASSESSMENT:  CLINICAL IMPRESSION: Patient presents with good motivation for today's session. She was able to demonstrate improving posture and no pain with all activities. She was able to demonstrate improved ability to stand from lower surface without UE support as well today. Pt will benefit from PT services to address deficits in strength, mobility, and pain in order to return to full function at home with less knee pain.   OBJECTIVE IMPAIRMENTS: Abnormal gait, decreased activity tolerance, decreased balance, decreased coordination, decreased endurance, decreased mobility, difficulty walking, decreased ROM, decreased strength, and hypomobility.   ACTIVITY LIMITATIONS: carrying, lifting, bending, sitting, standing, squatting, stairs, and transfers  PARTICIPATION LIMITATIONS: meal prep, cleaning, laundry, shopping, community activity, and yard work  PERSONAL FACTORS: 1-2 comorbidities: COPD, HTN are also affecting patient's functional outcome.   REHAB POTENTIAL: Good  CLINICAL DECISION MAKING: Evolving/moderate complexity  EVALUATION COMPLEXITY: Moderate  PLAN:  PT FREQUENCY: 1-2x/week  PT DURATION: 12 weeks  PLANNED INTERVENTIONS: [id number]- PT Re-evaluation, 97110-Therapeutic exercises, 97530- Therapeutic activity, [id number]- Neuromuscular re-education, 97535- Self Care, [id number]- Manual therapy, [id number]- Gait training, [phone]- Orthotic Fit/training, [phone]- Canalith repositioning, [phone]- Electrical stimulation (manual), Patient/Family education, Balance training, Stair training, Taping, Dry Needling, Joint mobilization,  Joint manipulation, Scar mobilization, Vestibular training, DME instructions, Cryotherapy, and Moist heat  PLAN FOR NEXT SESSION:   Continue with resistive LE strengthening Dynamic balance- stepping over objects, turning, retro walk Continue with functional endurance based activities.  Continue with postural activities.    Murlene Army, PT 06/27/2023, 5:30 PM  5:30 PM, 06/27/23

## 2023-06-27 NOTE — ED Triage Notes (Signed)
 Pt to ED via POV from home. Pt reports was eating fish and feels like she has a fish bone stuck in her throat. Pt denies SOB. No hx of swallowing issues.

## 2023-06-27 NOTE — ED Provider Notes (Signed)
 [facility] Provider Note    Event Date/Time   First MD Initiated Contact with Patient 06/27/23 1458     (approximate)   History   Dysphagia    HPI  Colleen [name] is a 78 y.o. female    with a past medical history of A fib, Iron deficiency anemia, osteoarthritis of left knee, bilateral knee replacement, hypertension, gout, who presents to the ED complaining of dysphagia. According to the patient, she was eating fish at 1 PM today swallowing a fish bone.  Patient states she was having pain after several minutes the pain resolved but now that she is feeling dysphagia at the left side of the thyroid  area.       Physical Exam   Triage Vital Signs: ED Triage Vitals  Encounter Vitals Group     BP 06/27/23 1420 128/67     Systolic BP Percentile --      Diastolic BP Percentile --      Pulse Rate 06/27/23 1420 (!) 102     Resp 06/27/23 1420 20     Temp 06/27/23 1420 98.2 F (36.8 C)     Temp src --      SpO2 06/27/23 1420 97 %     Weight --      Height --      Head Circumference --      Peak Flow --      Pain Score 06/27/23 1421 5     Pain Loc --      Pain Education --      Exclude from Growth Chart --     Most recent vital signs: Vitals:   06/27/23 1420  BP: 128/67  Pulse: (!) 102  Resp: 20  Temp: 98.2 F (36.8 C)  SpO2: 97%     Constitutional: Alert, NAD. Able to speak in complete sentences without cough or dyspnea  Eyes: Conjunctivae are normal.  Head: Atraumatic. Nose: No congestion/rhinnorhea. Mouth/Throat: Mucous membranes are moist.   Neck: Painless ROM. Supple. No JVD, nodes, thyromegaly  Cardiovascular:   Good peripheral circulation.RRR no murmurs, gallops, rubs  Respiratory: Normal respiratory effort.  No retractions. Clear to auscultation bilaterally without wheezing or crackles  Gastrointestinal: Soft and nontender.  Musculoskeletal:  no deformity Neurologic:  MAE spontaneously. No gross focal neurologic deficits are  appreciated.  Skin:  Skin is warm, dry and intact. No rash noted. Psychiatric: Mood and affect are normal. Speech and behavior are normal.    ED Results / Procedures / Treatments   Labs (all labs ordered are listed, but only abnormal results are displayed) Labs Reviewed - No data to display   EKG    RADIOLOGY I independently reviewed and interpreted imaging and agree with radiologists findings.      PROCEDURES:  Critical Care performed:   Procedures   MEDICATIONS ORDERED IN ED: Medications  alum & mag hydroxide-simeth (MAALOX/MYLANTA) 200-200-20 MG/5ML suspension 30 mL (30 mLs Oral Given 06/27/23 1744)    And  lidocaine  (XYLOCAINE ) 2 % viscous mouth solution 15 mL (15 mLs Oral Given 06/27/23 1744)   Clinical Course as of 06/27/23 1759  Mon Jun 27, 2023  1725 DG Neck Soft Tissue No radiopaque soft tissue foreign bodies are demonstrated. [AE]  1725 Updated patient with results of x-ray.  Ordered Malox  with lidocaine  for dysphagia.  I will reassess the patient after administration of it. [AE]  1758 Reassessed the patient after getting Maalox with lidocaine , patient states pain resolved.  Patient  is ready for discharge [AE]    Clinical Course User Index [AE] Awilda Lennox, PA-C    IMPRESSION / MDM / ASSESSMENT AND PLAN / ED COURSE  I reviewed the triage vital signs and the nursing notes.  Differential diagnosis includes, but is not limited to, foreign body, dysphagia, GERD  Patient's presentation is most consistent with acute complicated illness / injury requiring diagnostic workup.   Colleen [name], is a 78 year old female who presents today after swallowing a foreign fish .  Physical exam patient have full neck ROM without any pain .  Chest x-ray of the neck came back negative for foreign bodies.  Possibly patient swallowed the bone fish and scratched her throat producing dysphagia.  Patient's diagnosis is consistent with esophageal soft tissue injury with  fishbone. I independently reviewed and interpreted imaging and agree with radiologists findings ruling out foreign bodies.I did review the patient's allergies and medications.The patient is in stable and satisfactory condition for discharge home  Patient will be discharged home with prescriptions for famotidine . Patient is to follow up with PCP as needed or otherwise directed. Patient is given ED precautions to return to the ED for any worsening or new symptoms. Discussed plan of care with patient, answered all of patient's questions, Patient agreeable to plan of care. Advised patient to take medications according to the instructions on the label. Discussed possible side effects of new medications. Patient verbalized understanding.    FINAL CLINICAL IMPRESSION(S) / ED DIAGNOSES   Final diagnoses:  Oropharyngeal dysphagia     Rx / DC Orders   ED Discharge Orders     None        Note:  This document was prepared using Dragon voice recognition software and may include unintentional dictation errors.   Awilda Lennox, PA-C 06/27/23 2325    Claria Crofts, MD 06/28/23 1537

## 2023-06-27 NOTE — Discharge Instructions (Signed)
 You have been diagnosed with soft tissue injury of the esophagus after swallowing a fishbone.  X-ray were negative for bone fish and after taking pain medication your pain resolved.  Please come back to ED or go to your PCP if you have new symptoms or symptoms worsen

## 2023-06-27 NOTE — ED Notes (Signed)
 Pt is CAOx4, breathing normally, and normal in color. Pt is lying in bed and is in NAD. Pt is complaining of a fish bone stuck in her throat that she can feel every time she swallows. Pt states that she feels like the bone has moved down her esophagus since she's been in the ED and doesn't hurt as bad, but she can still feel it. Pt denies any needs at this time .

## 2023-06-29 ENCOUNTER — Ambulatory Visit

## 2023-06-29 DIAGNOSIS — R278 Other lack of coordination: Secondary | ICD-10-CM | POA: Diagnosis not present

## 2023-06-29 DIAGNOSIS — R2689 Other abnormalities of gait and mobility: Secondary | ICD-10-CM | POA: Diagnosis not present

## 2023-06-29 DIAGNOSIS — R269 Unspecified abnormalities of gait and mobility: Secondary | ICD-10-CM | POA: Diagnosis not present

## 2023-06-29 DIAGNOSIS — R2681 Unsteadiness on feet: Secondary | ICD-10-CM | POA: Diagnosis not present

## 2023-06-29 DIAGNOSIS — R262 Difficulty in walking, not elsewhere classified: Secondary | ICD-10-CM | POA: Diagnosis not present

## 2023-06-29 DIAGNOSIS — M6281 Muscle weakness (generalized): Secondary | ICD-10-CM

## 2023-06-29 NOTE — Therapy (Signed)
 OUTPATIENT PHYSICAL THERAPY NEURO TREATMENT   Patient Name: Colleen [name] MRN: [id number] DOB:1945-11-10, 78 y.o., female Today's Date: 06/29/2023   PCP: Adeline Hone, PA-C REFERRING PROVIDER: Adeline Hone, PA-C  END OF SESSION:  PT End of Session - 06/29/23 0912     Visit Number 17    Number of Visits 24    Date for PT Re-Evaluation 07/21/23    Progress Note Due on Visit 20    PT Start Time 0930    PT Stop Time 1013    PT Time Calculation (min) 43 min    Equipment Utilized During Treatment Gait belt    Activity Tolerance Patient tolerated treatment well;No increased pain    Behavior During Therapy WFL for tasks assessed/performed                        Past Medical History:  Diagnosis Date   Asthma    Cataract    COPD (chronic obstructive pulmonary disease) (HCC)    GERD (gastroesophageal reflux disease)    Gout    History of pulmonary embolus (PE)    Hyperlipidemia    Hypertension    Osteoarthrosis    Prediabetes    Past Surgical History:  Procedure Laterality Date   BACK SURGERY     BREAST BIOPSY Left 04/08/2014   intraductal papilloma 5 mm   COLONOSCOPY WITH PROPOFOL  N/A 04/23/2020   Procedure: COLONOSCOPY WITH PROPOFOL ;  Surgeon: Irby Mannan, MD;  Location: [facility] ENDOSCOPY;  Service: Endoscopy;  Laterality: N/A;   ESOPHAGOGASTRODUODENOSCOPY (EGD) WITH PROPOFOL  N/A 04/23/2020   Procedure: ESOPHAGOGASTRODUODENOSCOPY (EGD) WITH PROPOFOL ;  Surgeon: Irby Mannan, MD;  Location: [facility] ENDOSCOPY;  Service: Endoscopy;  Laterality: N/A;   GIVENS CAPSULE STUDY N/A 01/28/2022   Procedure: GIVENS CAPSULE STUDY;  Surgeon: Luke Salaam, MD;  Location: [facility] ENDOSCOPY;  Service: Gastroenterology;  Laterality: N/A;   LUMBAR LAMINECTOMY     TONSILLECTOMY     Patient Active Problem List   Diagnosis Date Noted   New onset type 2 diabetes mellitus (HCC) 04/04/2023   Acquired trigger finger of right middle finger 07/24/2021   Acquired trigger finger  of right ring finger 07/24/2021   Trigger finger of right hand 06/26/2021   Hidradenitis suppurativa 11/12/2020   Status post gastroplasty-1980s 06/25/2020   CLE (columnar lined esophagus)    Hiatal hernia    Gastric nodule    Angiodysplasia of stomach and duodenum    Cecal polyp    Polyp of descending colon    Polyp of transverse colon    Atrial fibrillation (HCC) 06/14/2019   Current use of anticoagulant therapy 06/14/2019   Iron deficiency anemia 06/14/2019   Essential (hemorrhagic) thrombocythemia (HCC) 06/14/2019   GERD (gastroesophageal reflux disease) 04/15/2019   Chronic low back pain 04/15/2019   Right pulmonary embolus (HCC) 04/15/2019   DOE (dyspnea on exertion) 03/13/2019   Atherosclerosis of both carotid arteries 09/20/2018   Spinal stenosis of lumbar region with neurogenic claudication 09/20/2018   Aortic atherosclerosis (HCC) 09/05/2018   At high risk for falls 09/05/2018   Osteopenia 05/18/2017   Stage 3 chronic kidney disease (HCC) 04/21/2017   Morbid obesity (HCC) 04/21/2017   Trigger middle finger of left hand 02/28/2017   Chronic gouty arthropathy without tophi 11/29/2016   Hypergammaglobulinemia 11/29/2016   Osteoarthritis of both knees 11/29/2016   Bilateral primary osteoarthritis of knee 11/29/2016   Allergic rhinitis, seasonal 07/30/2014   Gout 07/30/2014   HLD (hyperlipidemia) 07/30/2014  Osteoarthritis of knee 07/30/2014   Prediabetes 07/30/2014   Asthma, mild persistent 07/30/2014   COPD (chronic obstructive pulmonary disease) (HCC) 07/09/2008   Essential hypertension 06/22/2006    ONSET DATE: 6 months or so  REFERRING DIAG: R26.89 (ICD-10-CM) - Other abnormalities of gait and mobility  THERAPY DIAG:  Abnormality of gait and mobility  Difficulty in walking, not elsewhere classified  Muscle weakness (generalized)  Unsteadiness on feet  Other abnormalities of gait and mobility  Other lack of coordination  Rationale for Evaluation and  Treatment: Rehabilitation  SUBJECTIVE:                                                                                                                                                                                             SUBJECTIVE STATEMENT: Patient reports continuing to do well- denies any knee pain this morning   Pt accompanied by: self  PERTINENT HISTORY: Patient is a 78 year old female present today for Eval for gait/mobility due to prolonged B/L knee pain pt was hunching forward using walker (per MD referral). Patient has PMH of  2 Total knee replacement in 2024- most recent left knee in October 2024. States she did receive HHPT but no outpatient PT for either knee.   PAIN:  Are you having pain? No  PRECAUTIONS: Fall  RED FLAGS: None   WEIGHT BEARING RESTRICTIONS: No  FALLS: Has patient fallen in last 6 months? No   LIVING ENVIRONMENT: Lives with: lives alone Lives in: House/apartment Stairs: Yes: External: 4 steps; on right going up Has following equipment at home: Single point cane and Walker - 2 wheeled  PLOF: Independent  PATIENT GOALS: I want to be able to stand up straighter and walk without walker  OBJECTIVE:  Note: Objective measures were completed at Evaluation unless otherwise noted.  DIAGNOSTIC FINDINGS: no recent imaging of knee available in chart.  COGNITION: Overall cognitive status: Within functional limits for tasks assessed   SENSATION: WFL  COORDINATION: WNL  EDEMA:  None observed  POSTURE: rounded shoulders, forward head, and increased thoracic kyphosis  LOWER EXTREMITY ROM:     Active  Right Eval Left Eval RLE 06/06/2023 LLE 06/06/2023  Knee flexion 108 97 110 106  Knee extension       (Blank rows = not tested)  LOWER EXTREMITY MMT:    MMT Right Eval Left Eval  Hip flexion 4- 4-  Hip extension    Hip abduction 4- 4-  Hip adduction    Hip internal rotation 4- 4  Hip external rotation 4 4  Knee flexion 4 4  Knee  extension 4 4  Ankle dorsiflexion 4   Ankle plantarflexion    Ankle inversion    Ankle eversion    (Blank rows = not tested)  TRANSFERS: Assistive device utilized: Single point cane  Sit to stand: Modified independence Stand to sit: Modified independence Chair to chair: Modified independence Floor: Not assessed  GAIT: Gait pattern: step to pattern, decreased arm swing- Right, decreased arm swing- Left, decreased step length- Right, decreased step length- Left, decreased stance time- Right, decreased stance time- Left, and decreased stride length Distance walked: < 100 feet Assistive device utilized: Single point cane Level of assistance: SBA Comments: forward trunk posture, minimal step length bilaterally  FUNCTIONAL TESTS:  5 times sit to stand: 30.63 sec with BUE Support Timed up and go (TUG): 28.1 sec with SPC 10 Meter walk test= 25.07 and 27.2 sec with SPC= 0.38 m/s avg  PATIENT SURVEYS:  LEFS 39/80                                                                                                                             TREATMENT DATE: 06/29/23     Therapeutic Activities: dynamic therapeutic activities  designed to achieve improved functional performance   Sit to stand from chair without UE support (max effort but able to perform 2 on her own) then added an airex pad  and patient performed 2 x 12 reps  Standing squats at // bars x 10  reps x 2 sets Step ups alt LE  2x 15 reps  Side step up/onto 6" block (left to right up/then off repeat opp direction) x 12 reps     NMR: For posture kinesthetics  Deadlift 15# KB sitting on top of 6" blockl  2 x 10 reps (VC for technique)  Mid row matrix cable system x 12 reps 12.5# Face pulls- matrix cable system x 12 reps 7.5#            PATIENT EDUCATION: Education details: Purpose of PT; Discussion of post op knee rehab; purpose of functional outcome measure and goals; Plan of care Person educated:  Patient Education method: Explanation Education comprehension: verbalized understanding  HOME EXERCISE PROGRAM: Access Code: [email or web address] URL: [email or web address] Date: 05/03/2023 Prepared by: Marina  Moser  Exercises - Standing March with Counter Support  - 1 x daily - 7 x weekly - 2 sets - 10 reps - 5 hold - Seated Long Arc Quad  - 1 x daily - 7 x weekly - 2 sets - 10 reps - 5 hold - Proper Sit to Stand Technique  - 1 x daily - 7 x weekly - 2 sets - 10 reps - 5 hold  GOALS: Goals reviewed with patient? Yes  SHORT TERM GOALS: Target date: 06/09/2023  Pt will be independent with HEP in order to increase strength and improve  function at home and work Baseline: 06/06/2023- Patient reports good understanding of current HEP and no questions today. Goal status: MET  LONG TERM GOALS: Target date:  07/21/2023  Pt will increase LEFS by at least 9 points in order to demonstrate significant improvement in lower extremity function. Baseline: EVAL= 39/80 Goal status: INITIAL  2.  Patient will demonstrate improved bilateral knee active knee flex to > 115 deg for improve step ability, don/doff clothes, and ability to get in/out tub. Baseline: EVAL= R-108 deg and L-97 deg; 06/06/2023= 110 deg and 106 deg Goal status: PROGRESSING   3.  Pt will decrease 5TSTS by at least 10 seconds in order to demonstrate clinically significant improvement in LE strength. Baseline: EVAL= 30.63 with BUE Support; 06/06/2023= 14.87 sec with BUE Support and still unable to stand without UE Support Goal status: PROGRESSING  4.  Pt will improve BERG by at least 3 points in order to demonstrate clinically significant improvement in balance.   Baseline: EVAL=To be assessed next visit 4/8: 29; 06/06/2023= 42/56 Goal status: PROGRESSING- will keep goal active to ensure consistency.  5.  Pt will decrease TUG to below 14 seconds/decrease in order to demonstrate decreased fall risk. Baseline: EVAL= 28.1 sec with  SPC; 06/06/2023=22.32 sec avg using SPC  Goal status: PROGRESSING  6.  Pt will increase by at least 0.13 m/s in order to demonstrate clinically significant improvement in community ambulation.  Baseline: EVAL= 0.38 m/s with SPC; 06/06/2023=0.54  Goal status: PROGRESSING  ASSESSMENT:  CLINICAL IMPRESSION: Treatment continues to focus on postural strength and overall LE strengthening. She was able to lift a 15 lb kettlebell today without report of pain and improving form with practice. She was able to stand for 1st time from  a chair without UE support- very fatiguing yet able to complete. Pt will benefit from PT services to address deficits in strength, mobility, and pain in order to return to full function at home with less knee pain.   OBJECTIVE IMPAIRMENTS: Abnormal gait, decreased activity tolerance, decreased balance, decreased coordination, decreased endurance, decreased mobility, difficulty walking, decreased ROM, decreased strength, and hypomobility.   ACTIVITY LIMITATIONS: carrying, lifting, bending, sitting, standing, squatting, stairs, and transfers  PARTICIPATION LIMITATIONS: meal prep, cleaning, laundry, shopping, community activity, and yard work  PERSONAL FACTORS: 1-2 comorbidities: COPD, HTN are also affecting patient's functional outcome.   REHAB POTENTIAL: Good  CLINICAL DECISION MAKING: Evolving/moderate complexity  EVALUATION COMPLEXITY: Moderate  PLAN:  PT FREQUENCY: 1-2x/week  PT DURATION: 12 weeks  PLANNED INTERVENTIONS: 97164- PT Re-evaluation, 97110-Therapeutic exercises, 97530- Therapeutic activity, [id number]- Neuromuscular re-education, 97535- Self Care, 16109- Manual therapy, [id number]- Gait training, [id number]- Orthotic Fit/training, [id number]- Canalith repositioning, [phone]- Electrical stimulation (manual), Patient/Family education, Balance training, Stair training, Taping, Dry Needling, Joint mobilization, Joint manipulation, Scar mobilization, Vestibular training,  DME instructions, Cryotherapy, and Moist heat  PLAN FOR NEXT SESSION:   Continue with resistive LE strengthening Dynamic balance- stepping over objects, turning, retro walk Continue with functional endurance based activities.  Continue with postural activities.    Murlene Army, PT 06/29/2023, 1:34 PM  1:34 PM, 06/29/23

## 2023-07-04 ENCOUNTER — Ambulatory Visit

## 2023-07-04 DIAGNOSIS — R262 Difficulty in walking, not elsewhere classified: Secondary | ICD-10-CM

## 2023-07-04 DIAGNOSIS — R269 Unspecified abnormalities of gait and mobility: Secondary | ICD-10-CM | POA: Diagnosis not present

## 2023-07-04 DIAGNOSIS — M6281 Muscle weakness (generalized): Secondary | ICD-10-CM

## 2023-07-04 DIAGNOSIS — R2681 Unsteadiness on feet: Secondary | ICD-10-CM

## 2023-07-04 DIAGNOSIS — R2689 Other abnormalities of gait and mobility: Secondary | ICD-10-CM

## 2023-07-04 DIAGNOSIS — R278 Other lack of coordination: Secondary | ICD-10-CM

## 2023-07-04 NOTE — Therapy (Signed)
 OUTPATIENT PHYSICAL THERAPY NEURO TREATMENT   Patient Name: Colleen [name] MRN: [id number] DOB:1945-06-27, 78 y.o., female Today's Date: 07/04/2023   PCP: Adeline Hone, PA-C REFERRING PROVIDER: Adeline Hone, PA-C  END OF SESSION:  PT End of Session - 07/04/23 0850     Visit Number 18    Number of Visits 24    Date for PT Re-Evaluation 07/21/23    Progress Note Due on Visit 20    PT Start Time 0846    PT Stop Time 0926    PT Time Calculation (min) 40 min    Equipment Utilized During Treatment Gait belt    Activity Tolerance Patient tolerated treatment well;No increased pain    Behavior During Therapy WFL for tasks assessed/performed                         Past Medical History:  Diagnosis Date   Asthma    Cataract    COPD (chronic obstructive pulmonary disease) (HCC)    GERD (gastroesophageal reflux disease)    Gout    History of pulmonary embolus (PE)    Hyperlipidemia    Hypertension    Osteoarthrosis    Prediabetes    Past Surgical History:  Procedure Laterality Date   BACK SURGERY     BREAST BIOPSY Left 04/08/2014   intraductal papilloma 5 mm   COLONOSCOPY WITH PROPOFOL  N/A 04/23/2020   Procedure: COLONOSCOPY WITH PROPOFOL ;  Surgeon: Irby Mannan, MD;  Location: [facility] ENDOSCOPY;  Service: Endoscopy;  Laterality: N/A;   ESOPHAGOGASTRODUODENOSCOPY (EGD) WITH PROPOFOL  N/A 04/23/2020   Procedure: ESOPHAGOGASTRODUODENOSCOPY (EGD) WITH PROPOFOL ;  Surgeon: Irby Mannan, MD;  Location: [facility] ENDOSCOPY;  Service: Endoscopy;  Laterality: N/A;   GIVENS CAPSULE STUDY N/A 01/28/2022   Procedure: GIVENS CAPSULE STUDY;  Surgeon: Luke Salaam, MD;  Location: [facility] ENDOSCOPY;  Service: Gastroenterology;  Laterality: N/A;   LUMBAR LAMINECTOMY     TONSILLECTOMY     Patient Active Problem List   Diagnosis Date Noted   New onset type 2 diabetes mellitus (HCC) 04/04/2023   Acquired trigger finger of right middle finger 07/24/2021   Acquired trigger finger  of right ring finger 07/24/2021   Trigger finger of right hand 06/26/2021   Hidradenitis suppurativa 11/12/2020   Status post gastroplasty-1980s 06/25/2020   CLE (columnar lined esophagus)    Hiatal hernia    Gastric nodule    Angiodysplasia of stomach and duodenum    Cecal polyp    Polyp of descending colon    Polyp of transverse colon    Atrial fibrillation (HCC) 06/14/2019   Current use of anticoagulant therapy 06/14/2019   Iron deficiency anemia 06/14/2019   Essential (hemorrhagic) thrombocythemia (HCC) 06/14/2019   GERD (gastroesophageal reflux disease) 04/15/2019   Chronic low back pain 04/15/2019   Right pulmonary embolus (HCC) 04/15/2019   DOE (dyspnea on exertion) 03/13/2019   Atherosclerosis of both carotid arteries 09/20/2018   Spinal stenosis of lumbar region with neurogenic claudication 09/20/2018   Aortic atherosclerosis (HCC) 09/05/2018   At high risk for falls 09/05/2018   Osteopenia 05/18/2017   Stage 3 chronic kidney disease (HCC) 04/21/2017   Morbid obesity (HCC) 04/21/2017   Trigger middle finger of left hand 02/28/2017   Chronic gouty arthropathy without tophi 11/29/2016   Hypergammaglobulinemia 11/29/2016   Osteoarthritis of both knees 11/29/2016   Bilateral primary osteoarthritis of knee 11/29/2016   Allergic rhinitis, seasonal 07/30/2014   Gout 07/30/2014   HLD (hyperlipidemia) 07/30/2014  Osteoarthritis of knee 07/30/2014   Prediabetes 07/30/2014   Asthma, mild persistent 07/30/2014   COPD (chronic obstructive pulmonary disease) (HCC) 07/09/2008   Essential hypertension 06/22/2006    ONSET DATE: 6 months or so  REFERRING DIAG: R26.89 (ICD-10-CM) - Other abnormalities of gait and mobility  THERAPY DIAG:  Abnormality of gait and mobility  Difficulty in walking, not elsewhere classified  Muscle weakness (generalized)  Unsteadiness on feet  Other abnormalities of gait and mobility  Other lack of coordination  Rationale for Evaluation and  Treatment: Rehabilitation  SUBJECTIVE:                                                                                                                                                                                             SUBJECTIVE STATEMENT: Patient reports having a good weekend - able to perform all her housework without pain.     Pt accompanied by: self  PERTINENT HISTORY: Patient is a 78 year old female present today for Eval for gait/mobility due to prolonged B/L knee pain pt was hunching forward using walker (per MD referral). Patient has PMH of  2 Total knee replacement in 2024- most recent left knee in October 2024. States she did receive HHPT but no outpatient PT for either knee.   PAIN:  Are you having pain? No  PRECAUTIONS: Fall  RED FLAGS: None   WEIGHT BEARING RESTRICTIONS: No  FALLS: Has patient fallen in last 6 months? No   LIVING ENVIRONMENT: Lives with: lives alone Lives in: House/apartment Stairs: Yes: External: 4 steps; on right going up Has following equipment at home: Single point cane and Walker - 2 wheeled  PLOF: Independent  PATIENT GOALS: I want to be able to stand up straighter and walk without walker  OBJECTIVE:  Note: Objective measures were completed at Evaluation unless otherwise noted.  DIAGNOSTIC FINDINGS: no recent imaging of knee available in chart.  COGNITION: Overall cognitive status: Within functional limits for tasks assessed   SENSATION: WFL  COORDINATION: WNL  EDEMA:  None observed  POSTURE: rounded shoulders, forward head, and increased thoracic kyphosis  LOWER EXTREMITY ROM:     Active  Right Eval Left Eval RLE 06/06/2023 LLE 06/06/2023  Knee flexion 108 97 110 106  Knee extension       (Blank rows = not tested)  LOWER EXTREMITY MMT:    MMT Right Eval Left Eval  Hip flexion 4- 4-  Hip extension    Hip abduction 4- 4-  Hip adduction    Hip internal rotation 4- 4  Hip external rotation 4 4  Knee  flexion 4 4  Knee extension 4 4  Ankle dorsiflexion 4   Ankle plantarflexion    Ankle inversion    Ankle eversion    (Blank rows = not tested)  TRANSFERS: Assistive device utilized: Single point cane  Sit to stand: Modified independence Stand to sit: Modified independence Chair to chair: Modified independence Floor: Not assessed  GAIT: Gait pattern: step to pattern, decreased arm swing- Right, decreased arm swing- Left, decreased step length- Right, decreased step length- Left, decreased stance time- Right, decreased stance time- Left, and decreased stride length Distance walked: < 100 feet Assistive device utilized: Single point cane Level of assistance: SBA Comments: forward trunk posture, minimal step length bilaterally  FUNCTIONAL TESTS:  5 times sit to stand: 30.63 sec with BUE Support Timed up and go (TUG): 28.1 sec with SPC 10 Meter walk test= 25.07 and 27.2 sec with SPC= 0.38 m/s avg  PATIENT SURVEYS:  LEFS 39/80                                                                                                                             TREATMENT DATE: 07/04/23     Therapeutic Activities: dynamic therapeutic activities  designed to achieve improved functional performance     RESISTIVE GAIT-2 min walk with 3# aw BLE- focusing on erect posture. X 2 trips (total of 350 feet)  10 MWT with 3# AW= 0.48 m/s with use of cane.   Sit to stand from chair without UE support (max effort but able to perform 5 on her own) 2 x 10 with BUE support.  Forward lunge squats  onto 6" block in // bars x 10  reps x 2 sets ea LE.  Step taps onto 6" step x 20 reps alt LE without UE support Step ups alt LE  2x 15 reps (min UE support)  Side step up/onto 6" block (left to right up/then off repeat opp direction) x 12 reps                  PATIENT EDUCATION: Education details: Purpose of PT; Discussion of post op knee rehab; purpose of functional outcome measure and goals;  Plan of care Person educated: Patient Education method: Explanation Education comprehension: verbalized understanding  HOME EXERCISE PROGRAM: Access Code: [email or web address] URL: [email or web address] Date: 05/03/2023 Prepared by: Marina  Moser  Exercises - Standing March with Counter Support  - 1 x daily - 7 x weekly - 2 sets - 10 reps - 5 hold - Seated Long Arc Quad  - 1 x daily - 7 x weekly - 2 sets - 10 reps - 5 hold - Proper Sit to Stand Technique  - 1 x daily - 7 x weekly - 2 sets - 10 reps - 5 hold  GOALS: Goals reviewed with patient? Yes  SHORT TERM GOALS: Target date: 06/09/2023  Pt will be independent with HEP in order to increase strength and improve  function at home and work Baseline: 06/06/2023- Patient reports  good understanding of current HEP and no questions today. Goal status: MET  LONG TERM GOALS: Target date: 07/21/2023  Pt will increase LEFS by at least 9 points in order to demonstrate significant improvement in lower extremity function. Baseline: EVAL= 39/80 Goal status: INITIAL  2.  Patient will demonstrate improved bilateral knee active knee flex to > 115 deg for improve step ability, don/doff clothes, and ability to get in/out tub. Baseline: EVAL= R-108 deg and L-97 deg; 06/06/2023= 110 deg and 106 deg Goal status: PROGRESSING   3.  Pt will decrease 5TSTS by at least 10 seconds in order to demonstrate clinically significant improvement in LE strength. Baseline: EVAL= 30.63 with BUE Support; 06/06/2023= 14.87 sec with BUE Support and still unable to stand without UE Support Goal status: PROGRESSING  4.  Pt will improve BERG by at least 3 points in order to demonstrate clinically significant improvement in balance.   Baseline: EVAL=To be assessed next visit 4/8: 29; 06/06/2023= 42/56 Goal status: PROGRESSING- will keep goal active to ensure consistency.  5.  Pt will decrease TUG to below 14 seconds/decrease in order to demonstrate decreased fall  risk. Baseline: EVAL= 28.1 sec with SPC; 06/06/2023=22.32 sec avg using SPC  Goal status: PROGRESSING  6.  Pt will increase by at least 0.13 m/s in order to demonstrate clinically significant improvement in community ambulation.  Baseline: EVAL= 0.38 m/s with SPC; 06/06/2023=0.54  Goal status: PROGRESSING  ASSESSMENT:  CLINICAL IMPRESSION: Patient able to demo more progress with LE strength today and able to stand 5x without UE support. She was also able to walk with resistance well overall today- only exhibiting fatigue with walking and standing activities with no pain reported. Gait speed with resistance measured at 0.48 m/s which is faster than her speed measured initially.  Pt will benefit from PT services to address deficits in strength, mobility, and pain in order to return to full function at home with less knee pain.   OBJECTIVE IMPAIRMENTS: Abnormal gait, decreased activity tolerance, decreased balance, decreased coordination, decreased endurance, decreased mobility, difficulty walking, decreased ROM, decreased strength, and hypomobility.   ACTIVITY LIMITATIONS: carrying, lifting, bending, sitting, standing, squatting, stairs, and transfers  PARTICIPATION LIMITATIONS: meal prep, cleaning, laundry, shopping, community activity, and yard work  PERSONAL FACTORS: 1-2 comorbidities: COPD, HTN are also affecting patient's functional outcome.   REHAB POTENTIAL: Good  CLINICAL DECISION MAKING: Evolving/moderate complexity  EVALUATION COMPLEXITY: Moderate  PLAN:  PT FREQUENCY: 1-2x/week  PT DURATION: 12 weeks  PLANNED INTERVENTIONS: 97164- PT Re-evaluation, 97110-Therapeutic exercises, 97530- Therapeutic activity, [id number]- Neuromuscular re-education, 97535- Self Care, [id number]- Manual therapy, [id number]- Gait training, [phone]- Orthotic Fit/training, [phone]- Canalith repositioning, [phone]- Electrical stimulation (manual), Patient/Family education, Balance training, Stair training, Taping, Dry  Needling, Joint mobilization, Joint manipulation, Scar mobilization, Vestibular training, DME instructions, Cryotherapy, and Moist heat  PLAN FOR NEXT SESSION:   Continue with resistive LE strengthening Dynamic balance- stepping over objects, turning, retro walk Continue with functional endurance based activities.  Continue with postural activities.    Murlene Army, PT 07/04/2023, 9:36 AM  9:36 AM, 07/04/23

## 2023-07-06 ENCOUNTER — Ambulatory Visit

## 2023-07-06 ENCOUNTER — Other Ambulatory Visit: Payer: Self-pay | Admitting: Family Medicine

## 2023-07-06 DIAGNOSIS — R278 Other lack of coordination: Secondary | ICD-10-CM

## 2023-07-06 DIAGNOSIS — R269 Unspecified abnormalities of gait and mobility: Secondary | ICD-10-CM

## 2023-07-06 DIAGNOSIS — M109 Gout, unspecified: Secondary | ICD-10-CM

## 2023-07-06 DIAGNOSIS — R2681 Unsteadiness on feet: Secondary | ICD-10-CM

## 2023-07-06 DIAGNOSIS — R2689 Other abnormalities of gait and mobility: Secondary | ICD-10-CM

## 2023-07-06 DIAGNOSIS — M6281 Muscle weakness (generalized): Secondary | ICD-10-CM

## 2023-07-06 DIAGNOSIS — R262 Difficulty in walking, not elsewhere classified: Secondary | ICD-10-CM

## 2023-07-06 NOTE — Therapy (Signed)
 OUTPATIENT PHYSICAL THERAPY NEURO TREATMENT   Patient Name: ROSABEL SERMENO MRN: [id number] DOB:13-Jun-1945, 78 y.o., female Today's Date: 07/06/2023   PCP: Adeline Hone, PA-C REFERRING PROVIDER: Adeline Hone, PA-C  END OF SESSION:  PT End of Session - 07/06/23 0903     Visit Number 19    Number of Visits 24    Date for PT Re-Evaluation 07/21/23    Progress Note Due on Visit 20    PT Start Time 0845    PT Stop Time 0925    PT Time Calculation (min) 40 min    Equipment Utilized During Treatment Gait belt    Activity Tolerance Patient tolerated treatment well;No increased pain    Behavior During Therapy WFL for tasks assessed/performed                         Past Medical History:  Diagnosis Date   Asthma    Cataract    COPD (chronic obstructive pulmonary disease) (HCC)    GERD (gastroesophageal reflux disease)    Gout    History of pulmonary embolus (PE)    Hyperlipidemia    Hypertension    Osteoarthrosis    Prediabetes    Past Surgical History:  Procedure Laterality Date   BACK SURGERY     BREAST BIOPSY Left 04/08/2014   intraductal papilloma 5 mm   COLONOSCOPY WITH PROPOFOL  N/A 04/23/2020   Procedure: COLONOSCOPY WITH PROPOFOL ;  Surgeon: Irby Mannan, MD;  Location: [facility] ENDOSCOPY;  Service: Endoscopy;  Laterality: N/A;   ESOPHAGOGASTRODUODENOSCOPY (EGD) WITH PROPOFOL  N/A 04/23/2020   Procedure: ESOPHAGOGASTRODUODENOSCOPY (EGD) WITH PROPOFOL ;  Surgeon: Irby Mannan, MD;  Location: [facility] ENDOSCOPY;  Service: Endoscopy;  Laterality: N/A;   GIVENS CAPSULE STUDY N/A 01/28/2022   Procedure: GIVENS CAPSULE STUDY;  Surgeon: Luke Salaam, MD;  Location: [facility] ENDOSCOPY;  Service: Gastroenterology;  Laterality: N/A;   LUMBAR LAMINECTOMY     TONSILLECTOMY     Patient Active Problem List   Diagnosis Date Noted   New onset type 2 diabetes mellitus (HCC) 04/04/2023   Acquired trigger finger of right middle finger 07/24/2021   Acquired trigger  finger of right ring finger 07/24/2021   Trigger finger of right hand 06/26/2021   Hidradenitis suppurativa 11/12/2020   Status post gastroplasty-1980s 06/25/2020   CLE (columnar lined esophagus)    Hiatal hernia    Gastric nodule    Angiodysplasia of stomach and duodenum    Cecal polyp    Polyp of descending colon    Polyp of transverse colon    Atrial fibrillation (HCC) 06/14/2019   Current use of anticoagulant therapy 06/14/2019   Iron deficiency anemia 06/14/2019   Essential (hemorrhagic) thrombocythemia (HCC) 06/14/2019   GERD (gastroesophageal reflux disease) 04/15/2019   Chronic low back pain 04/15/2019   Right pulmonary embolus (HCC) 04/15/2019   DOE (dyspnea on exertion) 03/13/2019   Atherosclerosis of both carotid arteries 09/20/2018   Spinal stenosis of lumbar region with neurogenic claudication 09/20/2018   Aortic atherosclerosis (HCC) 09/05/2018   At high risk for falls 09/05/2018   Osteopenia 05/18/2017   Stage 3 chronic kidney disease (HCC) 04/21/2017   Morbid obesity (HCC) 04/21/2017   Trigger middle finger of left hand 02/28/2017   Chronic gouty arthropathy without tophi 11/29/2016   Hypergammaglobulinemia 11/29/2016   Osteoarthritis of both knees 11/29/2016   Bilateral primary osteoarthritis of knee 11/29/2016   Allergic rhinitis, seasonal 07/30/2014   Gout 07/30/2014   HLD (hyperlipidemia) 07/30/2014  Osteoarthritis of knee 07/30/2014   Prediabetes 07/30/2014   Asthma, mild persistent 07/30/2014   COPD (chronic obstructive pulmonary disease) (HCC) 07/09/2008   Essential hypertension 06/22/2006    ONSET DATE: 6 months or so  REFERRING DIAG: R26.89 (ICD-10-CM) - Other abnormalities of gait and mobility  THERAPY DIAG:  Abnormality of gait and mobility  Difficulty in walking, not elsewhere classified  Muscle weakness (generalized)  Unsteadiness on feet  Other abnormalities of gait and mobility  Other lack of coordination  Rationale for  Evaluation and Treatment: Rehabilitation  SUBJECTIVE:                                                                                                                                                                                             SUBJECTIVE STATEMENT: Patient reports she feels like she is getting some better- stronger.      Pt accompanied by: self  PERTINENT HISTORY: Patient is a 78 year old female present today for Eval for gait/mobility due to prolonged B/L knee pain pt was hunching forward using walker (per MD referral). Patient has PMH of  2 Total knee replacement in 2024- most recent left knee in October 2024. States she did receive HHPT but no outpatient PT for either knee.   PAIN:  Are you having pain? No  PRECAUTIONS: Fall  RED FLAGS: None   WEIGHT BEARING RESTRICTIONS: No  FALLS: Has patient fallen in last 6 months? No   LIVING ENVIRONMENT: Lives with: lives alone Lives in: House/apartment Stairs: Yes: External: 4 steps; on right going up Has following equipment at home: Single point cane and Walker - 2 wheeled  PLOF: Independent  PATIENT GOALS: I want to be able to stand up straighter and walk without walker  OBJECTIVE:  Note: Objective measures were completed at Evaluation unless otherwise noted.  DIAGNOSTIC FINDINGS: no recent imaging of knee available in chart.  COGNITION: Overall cognitive status: Within functional limits for tasks assessed   SENSATION: WFL  COORDINATION: WNL  EDEMA:  None observed  POSTURE: rounded shoulders, forward head, and increased thoracic kyphosis  LOWER EXTREMITY ROM:     Active  Right Eval Left Eval RLE 06/06/2023 LLE 06/06/2023  Knee flexion 108 97 110 106  Knee extension       (Blank rows = not tested)  LOWER EXTREMITY MMT:    MMT Right Eval Left Eval  Hip flexion 4- 4-  Hip extension    Hip abduction 4- 4-  Hip adduction    Hip internal rotation 4- 4  Hip external rotation 4 4  Knee  flexion 4 4  Knee extension 4  4  Ankle dorsiflexion 4   Ankle plantarflexion    Ankle inversion    Ankle eversion    (Blank rows = not tested)  TRANSFERS: Assistive device utilized: Single point cane  Sit to stand: Modified independence Stand to sit: Modified independence Chair to chair: Modified independence Floor: Not assessed  GAIT: Gait pattern: step to pattern, decreased arm swing- Right, decreased arm swing- Left, decreased step length- Right, decreased step length- Left, decreased stance time- Right, decreased stance time- Left, and decreased stride length Distance walked: < 100 feet Assistive device utilized: Single point cane Level of assistance: SBA Comments: forward trunk posture, minimal step length bilaterally  FUNCTIONAL TESTS:  5 times sit to stand: 30.63 sec with BUE Support Timed up and go (TUG): 28.1 sec with SPC 10 Meter walk test= 25.07 and 27.2 sec with SPC= 0.38 m/s avg  PATIENT SURVEYS:  LEFS 39/80                                                                                                                             TREATMENT DATE: 07/06/23     Therapeutic Activities: dynamic therapeutic activities  designed to achieve improved functional performance   INTERVAL NuStep- BUE/LE-  -1 min L1 -30 sec L4 -1 min L1 -1 min L5 - 1 min L1 -30 sec L6  Forward lunge squats  onto 6 block in // bars x 10  reps x 2 sets ea LE.  Step taps onto 6 step x 20 reps alt LE without UE support Step up onto 6 step alt LE  2x 15 reps (min UE support)  Side step up/onto 6 block (left to right up/then off repeat opp direction) x 12 reps   NMR:  Static stand on airex pad x 30 sec x 3 without UE support Dynamic high knee march on airex pad x 20 reps                PATIENT EDUCATION: Education details: Purpose of PT; Discussion of post op knee rehab; purpose of functional outcome measure and goals; Plan of care Person educated: Patient Education  method: Explanation Education comprehension: verbalized understanding  HOME EXERCISE PROGRAM: Access Code: [email or web address] URL: [email or web address] Date: 05/03/2023 Prepared by: Marina  Moser  Exercises - Standing March with Counter Support  - 1 x daily - 7 x weekly - 2 sets - 10 reps - 5 hold - Seated Long Arc Quad  - 1 x daily - 7 x weekly - 2 sets - 10 reps - 5 hold - Proper Sit to Stand Technique  - 1 x daily - 7 x weekly - 2 sets - 10 reps - 5 hold  GOALS: Goals reviewed with patient? Yes  SHORT TERM GOALS: Target date: 06/09/2023  Pt will be independent with HEP in order to increase strength and improve  function at home and work Baseline: 06/06/2023- Patient reports good understanding of current HEP and no questions today. Goal status:  MET  LONG TERM GOALS: Target date: 07/21/2023  Pt will increase LEFS by at least 9 points in order to demonstrate significant improvement in lower extremity function. Baseline: EVAL= 39/80 Goal status: INITIAL  2.  Patient will demonstrate improved bilateral knee active knee flex to > 115 deg for improve step ability, don/doff clothes, and ability to get in/out tub. Baseline: EVAL= R-108 deg and L-97 deg; 06/06/2023= 110 deg and 106 deg Goal status: PROGRESSING   3.  Pt will decrease 5TSTS by at least 10 seconds in order to demonstrate clinically significant improvement in LE strength. Baseline: EVAL= 30.63 with BUE Support; 06/06/2023= 14.87 sec with BUE Support and still unable to stand without UE Support Goal status: PROGRESSING  4.  Pt will improve BERG by at least 3 points in order to demonstrate clinically significant improvement in balance.   Baseline: EVAL=To be assessed next visit 4/8: 29; 06/06/2023= 42/56 Goal status: PROGRESSING- will keep goal active to ensure consistency.  5.  Pt will decrease TUG to below 14 seconds/decrease in order to demonstrate decreased fall risk. Baseline: EVAL= 28.1 sec with SPC; 06/06/2023=22.32  sec avg using SPC  Goal status: PROGRESSING  6.  Pt will increase by at least 0.13 m/s in order to demonstrate clinically significant improvement in community ambulation.  Baseline: EVAL= 0.38 m/s with SPC; 06/06/2023=0.54  Goal status: PROGRESSING  ASSESSMENT:  CLINICAL IMPRESSION: Patient able to  progress  to some balance training with good results. She demonstrated mostly ankle righting reaction and improved with practice- from minimal step height and unsteadiness to more SLS ability. Overall progressing with more standing functional endurance and less overall rest breaks.  Pt will benefit from PT services to address deficits in strength, mobility, and pain in order to return to full function at home with less knee pain.   OBJECTIVE IMPAIRMENTS: Abnormal gait, decreased activity tolerance, decreased balance, decreased coordination, decreased endurance, decreased mobility, difficulty walking, decreased ROM, decreased strength, and hypomobility.   ACTIVITY LIMITATIONS: carrying, lifting, bending, sitting, standing, squatting, stairs, and transfers  PARTICIPATION LIMITATIONS: meal prep, cleaning, laundry, shopping, community activity, and yard work  PERSONAL FACTORS: 1-2 comorbidities: COPD, HTN are also affecting patient's functional outcome.   REHAB POTENTIAL: Good  CLINICAL DECISION MAKING: Evolving/moderate complexity  EVALUATION COMPLEXITY: Moderate  PLAN:  PT FREQUENCY: 1-2x/week  PT DURATION: 12 weeks  PLANNED INTERVENTIONS: 97164- PT Re-evaluation, 97110-Therapeutic exercises, 97530- Therapeutic activity, [id number]- Neuromuscular re-education, 97535- Self Care, 16109- Manual therapy, [id number]- Gait training, [id number]- Orthotic Fit/training, [phone]- Canalith repositioning, [phone]- Electrical stimulation (manual), Patient/Family education, Balance training, Stair training, Taping, Dry Needling, Joint mobilization, Joint manipulation, Scar mobilization, Vestibular training, DME  instructions, Cryotherapy, and Moist heat  PLAN FOR NEXT SESSION:   Continue with resistive LE strengthening Dynamic balance- stepping over objects, turning, retro walk Continue with functional endurance based activities.  Continue with postural activities.    Murlene Army, PT 07/06/2023, 9:30 AM  9:30 AM, 07/06/23

## 2023-07-08 NOTE — Telephone Encounter (Signed)
 Pharmacy checking status on this refill request

## 2023-07-08 NOTE — Telephone Encounter (Signed)
 Requested medication (s) are due for refill today: yes  Requested medication (s) are on the active medication list: yes  Last refill:  03/21/23 #180 0 refills   Future visit scheduled: yes in 3 weeks  Notes to clinic:  no refills remain. Do you want to refill Rx?     Requested Prescriptions  Pending Prescriptions Disp Refills   allopurinol  (ZYLOPRIM ) 100 MG tablet [Pharmacy Med Name: Allopurinol  100 MG Oral Tablet] 180 tablet 0    Sig: Take 2 tablets by mouth once daily     Endocrinology:  Gout Agents - allopurinol  Passed - 07/08/2023 11:41 AM      Passed - Uric Acid in normal range and within 360 days    Uric Acid, Serum  Date Value Ref Range Status  03/31/2023 4.8 2.5 - 7.0 mg/dL Final    Comment:    Therapeutic target for gout patients: <6.0 mg/dL .    Uric Acid  Date Value Ref Range Status  07/01/2015 6.9 2.5 - 7.1 mg/dL Final    Comment:               Therapeutic target for gout patients: <6.0         Passed - Cr in normal range and within 360 days    Creat  Date Value Ref Range Status  03/31/2023 0.73 0.60 - 1.00 mg/dL Final         Passed - Valid encounter within last 12 months    Recent Outpatient Visits           3 months ago Breast cancer screening by mammogram   [facility] Adeline Hone, PA-C       Future Appointments             In 3 weeks Adeline Hone, PA-C [facility]            Passed - CBC within normal limits and completed in the last 12 months    WBC  Date Value Ref Range Status  03/31/2023 10.1 3.8 - 10.8 Thousand/uL Final   RBC  Date Value Ref Range Status  03/31/2023 4.48 3.80 - 5.10 Million/uL Final   Hemoglobin  Date Value Ref Range Status  03/31/2023 13.7 11.7 - 15.5 g/dL Final   HCT  Date Value Ref Range Status  03/31/2023 40.7 35.0 - 45.0 % Final   MCHC  Date Value Ref Range Status  03/31/2023 33.7 32.0 - 36.0 g/dL Final    Comment:    For adults, a slight  decrease in the calculated MCHC value (in the range of 30 to 32 g/dL) is most likely not clinically significant; however, it should be interpreted with caution in correlation with other red cell parameters and the patient's clinical condition.    [facility]  Date Value Ref Range Status  03/31/2023 30.6 27.0 - 33.0 pg Final   MCV  Date Value Ref Range Status  03/31/2023 90.8 80.0 - 100.0 fL Final   No results found for: PLTCOUNTKUC, LABPLAT, POCPLA RDW  Date Value Ref Range Status  03/31/2023 11.9 11.0 - 15.0 % Final

## 2023-07-11 ENCOUNTER — Ambulatory Visit

## 2023-07-11 DIAGNOSIS — M6281 Muscle weakness (generalized): Secondary | ICD-10-CM

## 2023-07-11 DIAGNOSIS — R262 Difficulty in walking, not elsewhere classified: Secondary | ICD-10-CM

## 2023-07-11 DIAGNOSIS — R269 Unspecified abnormalities of gait and mobility: Secondary | ICD-10-CM

## 2023-07-11 DIAGNOSIS — R2689 Other abnormalities of gait and mobility: Secondary | ICD-10-CM

## 2023-07-11 DIAGNOSIS — R278 Other lack of coordination: Secondary | ICD-10-CM

## 2023-07-11 DIAGNOSIS — R2681 Unsteadiness on feet: Secondary | ICD-10-CM

## 2023-07-11 NOTE — Therapy (Signed)
 OUTPATIENT PHYSICAL THERAPY NEURO TREATMENT/Physical Therapy Progress Note   Dates of reporting period  06/06/2023   to   07/11/2023    Patient Name: CARRELL PALMATIER MRN: [id number] DOB:03-20-1945, 78 y.o., female Today's Date: 07/11/2023   PCP: Adeline Hone, PA-C REFERRING PROVIDER: Adeline Hone, PA-C  END OF SESSION:  PT End of Session - 07/11/23 0803     Visit Number 20    Number of Visits 24    Date for PT Re-Evaluation 07/21/23    Progress Note Due on Visit 20    PT Start Time 0803    PT Stop Time 0844    PT Time Calculation (min) 41 min    Equipment Utilized During Treatment Gait belt    Activity Tolerance Patient tolerated treatment well;No increased pain    Behavior During Therapy WFL for tasks assessed/performed                       Past Medical History:  Diagnosis Date   Asthma    Cataract    COPD (chronic obstructive pulmonary disease) (HCC)    GERD (gastroesophageal reflux disease)    Gout    History of pulmonary embolus (PE)    Hyperlipidemia    Hypertension    Osteoarthrosis    Prediabetes    Past Surgical History:  Procedure Laterality Date   BACK SURGERY     BREAST BIOPSY Left 04/08/2014   intraductal papilloma 5 mm   COLONOSCOPY WITH PROPOFOL  N/A 04/23/2020   Procedure: COLONOSCOPY WITH PROPOFOL ;  Surgeon: Irby Mannan, MD;  Location: [facility] ENDOSCOPY;  Service: Endoscopy;  Laterality: N/A;   ESOPHAGOGASTRODUODENOSCOPY (EGD) WITH PROPOFOL  N/A 04/23/2020   Procedure: ESOPHAGOGASTRODUODENOSCOPY (EGD) WITH PROPOFOL ;  Surgeon: Irby Mannan, MD;  Location: [facility] ENDOSCOPY;  Service: Endoscopy;  Laterality: N/A;   GIVENS CAPSULE STUDY N/A 01/28/2022   Procedure: GIVENS CAPSULE STUDY;  Surgeon: Luke Salaam, MD;  Location: [facility] ENDOSCOPY;  Service: Gastroenterology;  Laterality: N/A;   LUMBAR LAMINECTOMY     TONSILLECTOMY     Patient Active Problem List   Diagnosis Date Noted   New onset type 2 diabetes mellitus (HCC)  04/04/2023   Acquired trigger finger of right middle finger 07/24/2021   Acquired trigger finger of right ring finger 07/24/2021   Trigger finger of right hand 06/26/2021   Hidradenitis suppurativa 11/12/2020   Status post gastroplasty-1980s 06/25/2020   CLE (columnar lined esophagus)    Hiatal hernia    Gastric nodule    Angiodysplasia of stomach and duodenum    Cecal polyp    Polyp of descending colon    Polyp of transverse colon    Atrial fibrillation (HCC) 06/14/2019   Current use of anticoagulant therapy 06/14/2019   Iron deficiency anemia 06/14/2019   Essential (hemorrhagic) thrombocythemia (HCC) 06/14/2019   GERD (gastroesophageal reflux disease) 04/15/2019   Chronic low back pain 04/15/2019   Right pulmonary embolus (HCC) 04/15/2019   DOE (dyspnea on exertion) 03/13/2019   Atherosclerosis of both carotid arteries 09/20/2018   Spinal stenosis of lumbar region with neurogenic claudication 09/20/2018   Aortic atherosclerosis (HCC) 09/05/2018   At high risk for falls 09/05/2018   Osteopenia 05/18/2017   Stage 3 chronic kidney disease (HCC) 04/21/2017   Morbid obesity (HCC) 04/21/2017   Trigger middle finger of left hand 02/28/2017   Chronic gouty arthropathy without tophi 11/29/2016   Hypergammaglobulinemia 11/29/2016   Osteoarthritis of both knees 11/29/2016   Bilateral primary osteoarthritis of knee 11/29/2016  Allergic rhinitis, seasonal 07/30/2014   Gout 07/30/2014   HLD (hyperlipidemia) 07/30/2014   Osteoarthritis of knee 07/30/2014   Prediabetes 07/30/2014   Asthma, mild persistent 07/30/2014   COPD (chronic obstructive pulmonary disease) (HCC) 07/09/2008   Essential hypertension 06/22/2006    ONSET DATE: 6 months or so  REFERRING DIAG: R26.89 (ICD-10-CM) - Other abnormalities of gait and mobility  THERAPY DIAG:  Abnormality of gait and mobility  Difficulty in walking, not elsewhere classified  Muscle weakness (generalized)  Unsteadiness on  feet  Other abnormalities of gait and mobility  Other lack of coordination  Rationale for Evaluation and Treatment: Rehabilitation  SUBJECTIVE:                                                                                                                                                                                             SUBJECTIVE STATEMENT: Patient reports having some left arm/wrist swelling/soreness- This happened 2 weeks ago but went away. States having some clicking sensation at times.       Pt accompanied by: self  PERTINENT HISTORY: Patient is a 78 year old female present today for Eval for gait/mobility due to prolonged B/L knee pain pt was hunching forward using walker (per MD referral). Patient has PMH of  2 Total knee replacement in 2024- most recent left knee in October 2024. States she did receive HHPT but no outpatient PT for either knee.   PAIN:  Are you having pain? No  PRECAUTIONS: Fall  RED FLAGS: None   WEIGHT BEARING RESTRICTIONS: No  FALLS: Has patient fallen in last 6 months? No   LIVING ENVIRONMENT: Lives with: lives alone Lives in: House/apartment Stairs: Yes: External: 4 steps; on right going up Has following equipment at home: Single point cane and Walker - 2 wheeled  PLOF: Independent  PATIENT GOALS: I want to be able to stand up straighter and walk without walker  OBJECTIVE:  Note: Objective measures were completed at Evaluation unless otherwise noted.  DIAGNOSTIC FINDINGS: no recent imaging of knee available in chart.  COGNITION: Overall cognitive status: Within functional limits for tasks assessed   SENSATION: WFL  COORDINATION: WNL  EDEMA:  None observed  POSTURE: rounded shoulders, forward head, and increased thoracic kyphosis  LOWER EXTREMITY ROM:     Active  Right Eval Left Eval RLE 06/06/2023 LLE 06/06/2023  Knee flexion 108 97 110 106  Knee extension       (Blank rows = not tested)  LOWER EXTREMITY MMT:     MMT Right Eval Left Eval  Hip flexion 4- 4-  Hip extension    Hip abduction 4-  4-  Hip adduction    Hip internal rotation 4- 4  Hip external rotation 4 4  Knee flexion 4 4  Knee extension 4 4  Ankle dorsiflexion 4   Ankle plantarflexion    Ankle inversion    Ankle eversion    (Blank rows = not tested)  TRANSFERS: Assistive device utilized: Single point cane  Sit to stand: Modified independence Stand to sit: Modified independence Chair to chair: Modified independence Floor: Not assessed  GAIT: Gait pattern: step to pattern, decreased arm swing- Right, decreased arm swing- Left, decreased step length- Right, decreased step length- Left, decreased stance time- Right, decreased stance time- Left, and decreased stride length Distance walked: < 100 feet Assistive device utilized: Single point cane Level of assistance: SBA Comments: forward trunk posture, minimal step length bilaterally  FUNCTIONAL TESTS:  5 times sit to stand: 30.63 sec with BUE Support Timed up and go (TUG): 28.1 sec with SPC 10 Meter walk test= 25.07 and 27.2 sec with SPC= 0.38 m/s avg  PATIENT SURVEYS:  LEFS 39/80                                                                                                                             TREATMENT DATE: 07/11/23   Physical therapy treatment session today consisted of completing assessment of goals and administration of testing as demonstrated and documented in flow sheet, treatment, and goals section of this note. Addition treatments may be found below.     LEFS reassessed: 52/80  PT instructed pt in TUG: 21.1 sec using SPC  (average of 3 trials; >13.5 sec indicates increased fall risk)   10 Meter Walk Test: Patient instructed to walk 10 meters (32.8 ft) as quickly and as safely as possible at their normal speed x2 and at a fast speed x2. Time measured from 2 meter mark to 8 meter mark to accommodate ramp-up and ramp-down.  Normal speed 1: 18.0  sec Normal speed 2: 18.21 sec Average Normal speed: 0.55 m/s Cut off scores: <0.4 m/s = household Ambulator, 0.4-0.8 m/s = limited community Ambulator, >0.8 m/s = community Ambulator, >1.2 m/s = crossing a street, <1.0 = increased fall risk MCID 0.05 m/s (small), 0.13 m/s (moderate), 0.06 m/s (significant)  (ANPTA Core Set of Outcome Measures for Adults with Neurologic Conditions, 2018)    [facility] PT Assessment - 07/11/23 0850       Berg Balance Test   Sit to Stand Able to stand without using hands and stabilize independently    Standing Unsupported Able to stand safely 2 minutes    Sitting with Back Unsupported but Feet Supported on Floor or Stool Able to sit safely and securely 2 minutes    Stand to Sit Sits safely with minimal use of hands    Transfers Able to transfer safely, minor use of hands    Standing Unsupported with Eyes Closed Able to stand 10 seconds safely    Standing Unsupported with  Feet Together Able to place feet together independently and stand 1 minute safely    From Standing, Reach Forward with Outstretched Arm Can reach forward >12 cm safely (5)    From Standing Position, Pick up Object from Floor Able to pick up shoe safely and easily    From Standing Position, Turn to Look Behind Over each Shoulder Turn sideways only but maintains balance    Turn 360 Degrees Able to turn 360 degrees safely but slowly    Standing Unsupported, Alternately Place Feet on Step/Stool Able to complete >2 steps/needs minimal assist    Standing Unsupported, One Foot in Front Able to take small step independently and hold 30 seconds    Standing on One Leg Tries to lift leg/unable to hold 3 seconds but remains standing independently    Total Score 43                      PATIENT EDUCATION: Education details: Purpose of PT; Discussion of post op knee rehab; purpose of functional outcome measure and goals; Plan of care Person educated: Patient Education method:  Explanation Education comprehension: verbalized understanding  HOME EXERCISE PROGRAM: Access Code: [email or web address] URL: [email or web address] Date: 05/03/2023 Prepared by: Marina  Moser  Exercises - Standing March with Counter Support  - 1 x daily - 7 x weekly - 2 sets - 10 reps - 5 hold - Seated Long Arc Quad  - 1 x daily - 7 x weekly - 2 sets - 10 reps - 5 hold - Proper Sit to Stand Technique  - 1 x daily - 7 x weekly - 2 sets - 10 reps - 5 hold  GOALS: Goals reviewed with patient? Yes  SHORT TERM GOALS: Target date: 06/09/2023  Pt will be independent with HEP in order to increase strength and improve  function at home and work Baseline: 06/06/2023- Patient reports good understanding of current HEP and no questions today. Goal status: MET  LONG TERM GOALS: Target date: 07/21/2023  Pt will increase LEFS by at least 9 points in order to demonstrate significant improvement in lower extremity function. Baseline: EVAL= 39/80; 07/11/2023=52/80 Goal status: MET  2.  Patient will demonstrate improved bilateral knee active knee flex to > 115 deg for improve step ability, don/doff clothes, and ability to get in/out tub. Baseline: EVAL= R-108 deg and L-97 deg; 06/06/2023= 110 deg and 106 deg; 07/11/2023= 115 on right and 106 on left Goal status: PROGRESSING   3.  Pt will decrease 5TSTS by at least 10 seconds in order to demonstrate clinically significant improvement in LE strength. Baseline: EVAL= 30.63 with BUE Support; 06/06/2023= 14.87 sec with BUE Support and still unable to stand without UE Support; 07/11/2023= 18.3 sec with only 1 UE support  Goal status: PROGRESSING  4.  Pt will improve BERG by at least 3 points in order to demonstrate clinically significant improvement in balance.   Baseline: EVAL=To be assessed next visit 4/8: 29; 06/06/2023= 42/56; 07/11/2023= 43/56 Goal status: MET.  5.  Pt will decrease TUG to below 14 seconds/decrease in order to demonstrate decreased fall  risk. Baseline: EVAL= 28.1 sec with SPC; 06/06/2023=22.32 sec avg using SPC; 07/11/2023= 21.10 sec with SPC Goal status: PROGRESSING  6.  Pt will increase by at least 0.13 m/s in order to demonstrate clinically significant improvement in community ambulation.  Baseline: EVAL= 0.38 m/s with SPC; 06/06/2023=0.54; 07/11/2023= 0.55 m/s using SPC  Goal status: PROGRESSING  ASSESSMENT:  CLINICAL IMPRESSION: Patient presents with  good motivation for today's assessment visit. She presents with improved functional mobility and steady progress toward goals. She demonstrated improvement with LE ROM and strength as seen by improved AROM and improved timed up and go/10 MWT test. She has met her balance goal set at this time- may benfefit from adding more dynamic balance test upon recert. She also met her LEFS indicating a significant improve in her self perceived LE functional ability. Patient's condition has the potential to improve in response to therapy. Maximum improvement is yet to be obtained. The anticipated improvement is attainable and reasonable in a generally predictable time.  Pt will benefit from PT services to address deficits in strength, mobility, and pain in order to return to full function at home with less knee pain.   OBJECTIVE IMPAIRMENTS: Abnormal gait, decreased activity tolerance, decreased balance, decreased coordination, decreased endurance, decreased mobility, difficulty walking, decreased ROM, decreased strength, and hypomobility.   ACTIVITY LIMITATIONS: carrying, lifting, bending, sitting, standing, squatting, stairs, and transfers  PARTICIPATION LIMITATIONS: meal prep, cleaning, laundry, shopping, community activity, and yard work  PERSONAL FACTORS: 1-2 comorbidities: COPD, HTN are also affecting patient's functional outcome.   REHAB POTENTIAL: Good  CLINICAL DECISION MAKING: Evolving/moderate complexity  EVALUATION COMPLEXITY: Moderate  PLAN:  PT FREQUENCY:  1-2x/week  PT DURATION: 12 weeks  PLANNED INTERVENTIONS: 97164- PT Re-evaluation, 97110-Therapeutic exercises, 97530- Therapeutic activity, [id number]- Neuromuscular re-education, 97535- Self Care, 16109- Manual therapy, [id number]- Gait training, [phone]- Orthotic Fit/training, [phone]- Canalith repositioning, [phone]- Electrical stimulation (manual), Patient/Family education, Balance training, Stair training, Taping, Dry Needling, Joint mobilization, Joint manipulation, Scar mobilization, Vestibular training, DME instructions, Cryotherapy, and Moist heat  PLAN FOR NEXT SESSION:   Continue with resistive LE strengthening Dynamic balance- stepping over objects, turning, retro walk Continue with functional endurance based activities.  Continue with postural activities.    Murlene Army, PT 07/11/2023, 12:16 PM  12:16 PM, 07/11/23

## 2023-07-12 ENCOUNTER — Ambulatory Visit: Admitting: Physical Therapy

## 2023-07-13 ENCOUNTER — Ambulatory Visit

## 2023-07-13 DIAGNOSIS — R269 Unspecified abnormalities of gait and mobility: Secondary | ICD-10-CM | POA: Diagnosis not present

## 2023-07-13 DIAGNOSIS — R278 Other lack of coordination: Secondary | ICD-10-CM

## 2023-07-13 DIAGNOSIS — R262 Difficulty in walking, not elsewhere classified: Secondary | ICD-10-CM

## 2023-07-13 DIAGNOSIS — M6281 Muscle weakness (generalized): Secondary | ICD-10-CM

## 2023-07-13 DIAGNOSIS — R2689 Other abnormalities of gait and mobility: Secondary | ICD-10-CM

## 2023-07-13 DIAGNOSIS — R2681 Unsteadiness on feet: Secondary | ICD-10-CM

## 2023-07-13 NOTE — Therapy (Signed)
 OUTPATIENT PHYSICAL THERAPY NEURO TREATMENT   Patient Name: Colleen [name] MRN: [id number] DOB:September 15, 1945, 78 y.o., female Today's Date: 07/13/2023   PCP: Adeline Hone, PA-C REFERRING PROVIDER: Adeline Hone, PA-C  END OF SESSION:  PT End of Session - 07/13/23 0804     Visit Number 21    Number of Visits 24    Date for PT Re-Evaluation 07/21/23    Progress Note Due on Visit 30    PT Start Time 0800    PT Stop Time 0842    PT Time Calculation (min) 42 min    Equipment Utilized During Treatment Gait belt    Activity Tolerance Patient tolerated treatment well;No increased pain    Behavior During Therapy WFL for tasks assessed/performed                       Past Medical History:  Diagnosis Date   Asthma    Cataract    COPD (chronic obstructive pulmonary disease) (HCC)    GERD (gastroesophageal reflux disease)    Gout    History of pulmonary embolus (PE)    Hyperlipidemia    Hypertension    Osteoarthrosis    Prediabetes    Past Surgical History:  Procedure Laterality Date   BACK SURGERY     BREAST BIOPSY Left 04/08/2014   intraductal papilloma 5 mm   COLONOSCOPY WITH PROPOFOL  N/A 04/23/2020   Procedure: COLONOSCOPY WITH PROPOFOL ;  Surgeon: Irby Mannan, MD;  Location: [facility] ENDOSCOPY;  Service: Endoscopy;  Laterality: N/A;   ESOPHAGOGASTRODUODENOSCOPY (EGD) WITH PROPOFOL  N/A 04/23/2020   Procedure: ESOPHAGOGASTRODUODENOSCOPY (EGD) WITH PROPOFOL ;  Surgeon: Irby Mannan, MD;  Location: [facility] ENDOSCOPY;  Service: Endoscopy;  Laterality: N/A;   GIVENS CAPSULE STUDY N/A 01/28/2022   Procedure: GIVENS CAPSULE STUDY;  Surgeon: Luke Salaam, MD;  Location: [facility] ENDOSCOPY;  Service: Gastroenterology;  Laterality: N/A;   LUMBAR LAMINECTOMY     TONSILLECTOMY     Patient Active Problem List   Diagnosis Date Noted   New onset type 2 diabetes mellitus (HCC) 04/04/2023   Acquired trigger finger of right middle finger 07/24/2021   Acquired trigger finger  of right ring finger 07/24/2021   Trigger finger of right hand 06/26/2021   Hidradenitis suppurativa 11/12/2020   Status post gastroplasty-1980s 06/25/2020   CLE (columnar lined esophagus)    Hiatal hernia    Gastric nodule    Angiodysplasia of stomach and duodenum    Cecal polyp    Polyp of descending colon    Polyp of transverse colon    Atrial fibrillation (HCC) 06/14/2019   Current use of anticoagulant therapy 06/14/2019   Iron deficiency anemia 06/14/2019   Essential (hemorrhagic) thrombocythemia (HCC) 06/14/2019   GERD (gastroesophageal reflux disease) 04/15/2019   Chronic low back pain 04/15/2019   Right pulmonary embolus (HCC) 04/15/2019   DOE (dyspnea on exertion) 03/13/2019   Atherosclerosis of both carotid arteries 09/20/2018   Spinal stenosis of lumbar region with neurogenic claudication 09/20/2018   Aortic atherosclerosis (HCC) 09/05/2018   At high risk for falls 09/05/2018   Osteopenia 05/18/2017   Stage 3 chronic kidney disease (HCC) 04/21/2017   Morbid obesity (HCC) 04/21/2017   Trigger middle finger of left hand 02/28/2017   Chronic gouty arthropathy without tophi 11/29/2016   Hypergammaglobulinemia 11/29/2016   Osteoarthritis of both knees 11/29/2016   Bilateral primary osteoarthritis of knee 11/29/2016   Allergic rhinitis, seasonal 07/30/2014   Gout 07/30/2014   HLD (hyperlipidemia) 07/30/2014   Osteoarthritis  of knee 07/30/2014   Prediabetes 07/30/2014   Asthma, mild persistent 07/30/2014   COPD (chronic obstructive pulmonary disease) (HCC) 07/09/2008   Essential hypertension 06/22/2006    ONSET DATE: 6 months or so  REFERRING DIAG: R26.89 (ICD-10-CM) - Other abnormalities of gait and mobility  THERAPY DIAG:  Abnormality of gait and mobility  Difficulty in walking, not elsewhere classified  Muscle weakness (generalized)  Unsteadiness on feet  Other abnormalities of gait and mobility  Other lack of coordination  Rationale for Evaluation and  Treatment: Rehabilitation  SUBJECTIVE:                                                                                                                                                                                             SUBJECTIVE STATEMENT: Patient reports doing okay- left hand is feeling some better.        Pt accompanied by: self  PERTINENT HISTORY: Patient is a 78 year old female present today for Eval for gait/mobility due to prolonged B/L knee pain pt was hunching forward using walker (per MD referral). Patient has PMH of  2 Total knee replacement in 2024- most recent left knee in October 2024. States she did receive HHPT but no outpatient PT for either knee.   PAIN:  Are you having pain? No  PRECAUTIONS: Fall  RED FLAGS: None   WEIGHT BEARING RESTRICTIONS: No  FALLS: Has patient fallen in last 6 months? No   LIVING ENVIRONMENT: Lives with: lives alone Lives in: House/apartment Stairs: Yes: External: 4 steps; on right going up Has following equipment at home: Single point cane and Walker - 2 wheeled  PLOF: Independent  PATIENT GOALS: I want to be able to stand up straighter and walk without walker  OBJECTIVE:  Note: Objective measures were completed at Evaluation unless otherwise noted.  DIAGNOSTIC FINDINGS: no recent imaging of knee available in chart.  COGNITION: Overall cognitive status: Within functional limits for tasks assessed   SENSATION: WFL  COORDINATION: WNL  EDEMA:  None observed  POSTURE: rounded shoulders, forward head, and increased thoracic kyphosis  LOWER EXTREMITY ROM:     Active  Right Eval Left Eval RLE 06/06/2023 LLE 06/06/2023  Knee flexion 108 97 110 106  Knee extension       (Blank rows = not tested)  LOWER EXTREMITY MMT:    MMT Right Eval Left Eval  Hip flexion 4- 4-  Hip extension    Hip abduction 4- 4-  Hip adduction    Hip internal rotation 4- 4  Hip external rotation 4 4  Knee flexion 4 4  Knee  extension 4  4  Ankle dorsiflexion 4   Ankle plantarflexion    Ankle inversion    Ankle eversion    (Blank rows = not tested)  TRANSFERS: Assistive device utilized: Single point cane  Sit to stand: Modified independence Stand to sit: Modified independence Chair to chair: Modified independence Floor: Not assessed  GAIT: Gait pattern: step to pattern, decreased arm swing- Right, decreased arm swing- Left, decreased step length- Right, decreased step length- Left, decreased stance time- Right, decreased stance time- Left, and decreased stride length Distance walked: < 100 feet Assistive device utilized: Single point cane Level of assistance: SBA Comments: forward trunk posture, minimal step length bilaterally  FUNCTIONAL TESTS:  5 times sit to stand: 30.63 sec with BUE Support Timed up and go (TUG): 28.1 sec with SPC 10 Meter walk test= 25.07 and 27.2 sec with SPC= 0.38 m/s avg  PATIENT SURVEYS:  LEFS 39/80                                                                                                                             TREATMENT DATE: 07/13/23   Therapeutic Activities: dynamic therapeutic activities  designed to achieve improved functional performance   -Step up 3# AW alt LE with min UE Support onto 6 block x 15 reps. No report of pain.   -Side step up onto 6 block then off on opp side- back up and down opp direction x 12 reps ea direction with 3# AW. Patient reports as tiring.   Sit to stand without UE support x 6 today- Patient states I can't believe it but I did it! Very fatigued upon completion- close CGA.  Resistive gait 3# AW- 175 feet- with SPC- Minimal VC for increasing step length and patient performed well with looking ahead today.   Resistive standing hip ext 3# AW x 14 reps each side.   NMR: Resistive gait along 10 feet distance (counting steps to improve step length) with SPC - 10 feet fwd then 10 feet backward  1) 7 steps fwd/11 steps bwd  2) 7  steps fwd/9 steps bwd  3) 7 steps fwd/8 steps bwd  Tandem standing: at support bar At best able to stand 25 sec each side after multiple attempts        PATIENT EDUCATION: Education details: Purpose of PT; Discussion of post op knee rehab; purpose of functional outcome measure and goals; Plan of care Person educated: Patient Education method: Explanation Education comprehension: verbalized understanding  HOME EXERCISE PROGRAM: Access Code: [email or web address] URL: [email or web address] Date: 05/03/2023 Prepared by: Marina  Moser  Exercises - Standing March with Counter Support  - 1 x daily - 7 x weekly - 2 sets - 10 reps - 5 hold - Seated Long Arc Quad  - 1 x daily - 7 x weekly - 2 sets - 10 reps - 5 hold - Proper Sit to Stand Technique  - 1 x daily - 7 x weekly - 2 sets - 10  reps - 5 hold  GOALS: Goals reviewed with patient? Yes  SHORT TERM GOALS: Target date: 06/09/2023  Pt will be independent with HEP in order to increase strength and improve  function at home and work Baseline: 06/06/2023- Patient reports good understanding of current HEP and no questions today. Goal status: MET  LONG TERM GOALS: Target date: 07/21/2023  Pt will increase LEFS by at least 9 points in order to demonstrate significant improvement in lower extremity function. Baseline: EVAL= 39/80; 07/11/2023=52/80 Goal status: MET  2.  Patient will demonstrate improved bilateral knee active knee flex to > 115 deg for improve step ability, don/doff clothes, and ability to get in/out tub. Baseline: EVAL= R-108 deg and L-97 deg; 06/06/2023= 110 deg and 106 deg; 07/11/2023= 115 on right and 106 on left Goal status: PROGRESSING   3.  Pt will decrease 5TSTS by at least 10 seconds in order to demonstrate clinically significant improvement in LE strength. Baseline: EVAL= 30.63 with BUE Support; 06/06/2023= 14.87 sec with BUE Support and still unable to stand without UE Support; 07/11/2023= 18.3 sec with only 1 UE  support  Goal status: PROGRESSING  4.  Pt will improve BERG by at least 3 points in order to demonstrate clinically significant improvement in balance.   Baseline: EVAL=To be assessed next visit 4/8: 29; 06/06/2023= 42/56; 07/11/2023= 43/56 Goal status: MET.  5.  Pt will decrease TUG to below 14 seconds/decrease in order to demonstrate decreased fall risk. Baseline: EVAL= 28.1 sec with SPC; 06/06/2023=22.32 sec avg using SPC; 07/11/2023= 21.10 sec with SPC Goal status: PROGRESSING  6.  Pt will increase by at least 0.13 m/s in order to demonstrate clinically significant improvement in community ambulation.  Baseline: EVAL= 0.38 m/s with SPC; 06/06/2023=0.54; 07/11/2023= 0.55 m/s using SPC  Goal status: PROGRESSING  ASSESSMENT:  CLINICAL IMPRESSION: Patient demonstrated good improvement in LE strength today- able to stand 6 times without UE support as previously unable. She was also able to walk with resistance and less overall VC for erect posture. Pt will benefit from PT services to address deficits in strength, mobility, and pain in order to return to full function at home with less knee pain.   OBJECTIVE IMPAIRMENTS: Abnormal gait, decreased activity tolerance, decreased balance, decreased coordination, decreased endurance, decreased mobility, difficulty walking, decreased ROM, decreased strength, and hypomobility.   ACTIVITY LIMITATIONS: carrying, lifting, bending, sitting, standing, squatting, stairs, and transfers  PARTICIPATION LIMITATIONS: meal prep, cleaning, laundry, shopping, community activity, and yard work  PERSONAL FACTORS: 1-2 comorbidities: COPD, HTN are also affecting patient's functional outcome.   REHAB POTENTIAL: Good  CLINICAL DECISION MAKING: Evolving/moderate complexity  EVALUATION COMPLEXITY: Moderate  PLAN:  PT FREQUENCY: 1-2x/week  PT DURATION: 12 weeks  PLANNED INTERVENTIONS: 97164- PT Re-evaluation, 97110-Therapeutic exercises, 97530- Therapeutic  activity, [id number]- Neuromuscular re-education, 97535- Self Care, [id number]- Manual therapy, [id number]- Gait training, [phone]- Orthotic Fit/training, [phone]- Canalith repositioning, [phone]- Electrical stimulation (manual), Patient/Family education, Balance training, Stair training, Taping, Dry Needling, Joint mobilization, Joint manipulation, Scar mobilization, Vestibular training, DME instructions, Cryotherapy, and Moist heat  PLAN FOR NEXT SESSION:   Continue with resistive LE strengthening Dynamic balance- stepping over objects, turning, retro walk Continue with functional endurance based activities.  Continue with postural activities.    Murlene Army, PT 07/13/2023, 8:43 AM  8:43 AM, 07/13/23

## 2023-07-15 ENCOUNTER — Ambulatory Visit: Admitting: Physical Therapy

## 2023-07-17 NOTE — Therapy (Signed)
 OUTPATIENT PHYSICAL THERAPY NEURO TREATMENT   Patient Name: Colleen [name] MRN: [id number] DOB:14-Nov-1945, 78 y.o., female Today's Date: 07/18/2023   PCP: Michelene Cower, PA-C REFERRING PROVIDER: Michelene Cower, PA-C  END OF SESSION:  PT End of Session - 07/18/23 0802     Visit Number 22    Number of Visits 24    Date for PT Re-Evaluation 07/21/23    Progress Note Due on Visit 30    PT Start Time 0758    PT Stop Time 0843    PT Time Calculation (min) 45 min    Equipment Utilized During Treatment Gait belt    Activity Tolerance Patient tolerated treatment well;No increased pain    Behavior During Therapy WFL for tasks assessed/performed                        Past Medical History:  Diagnosis Date   Asthma    Cataract    COPD (chronic obstructive pulmonary disease) (HCC)    GERD (gastroesophageal reflux disease)    Gout    History of pulmonary embolus (PE)    Hyperlipidemia    Hypertension    Osteoarthrosis    Prediabetes    Past Surgical History:  Procedure Laterality Date   BACK SURGERY     BREAST BIOPSY Left 04/08/2014   intraductal papilloma 5 mm   COLONOSCOPY WITH PROPOFOL  N/A 04/23/2020   Procedure: COLONOSCOPY WITH PROPOFOL ;  Surgeon: Janalyn Keene NOVAK, MD;  Location: [facility] ENDOSCOPY;  Service: Endoscopy;  Laterality: N/A;   ESOPHAGOGASTRODUODENOSCOPY (EGD) WITH PROPOFOL  N/A 04/23/2020   Procedure: ESOPHAGOGASTRODUODENOSCOPY (EGD) WITH PROPOFOL ;  Surgeon: Janalyn Keene NOVAK, MD;  Location: [facility] ENDOSCOPY;  Service: Endoscopy;  Laterality: N/A;   GIVENS CAPSULE STUDY N/A 01/28/2022   Procedure: GIVENS CAPSULE STUDY;  Surgeon: Therisa Bi, MD;  Location: [facility] ENDOSCOPY;  Service: Gastroenterology;  Laterality: N/A;   LUMBAR LAMINECTOMY     TONSILLECTOMY     Patient Active Problem List   Diagnosis Date Noted   New onset type 2 diabetes mellitus (HCC) 04/04/2023   Acquired trigger finger of right middle finger 07/24/2021   Acquired trigger finger  of right ring finger 07/24/2021   Trigger finger of right hand 06/26/2021   Hidradenitis suppurativa 11/12/2020   Status post gastroplasty-1980s 06/25/2020   CLE (columnar lined esophagus)    Hiatal hernia    Gastric nodule    Angiodysplasia of stomach and duodenum    Cecal polyp    Polyp of descending colon    Polyp of transverse colon    Atrial fibrillation (HCC) 06/14/2019   Current use of anticoagulant therapy 06/14/2019   Iron deficiency anemia 06/14/2019   Essential (hemorrhagic) thrombocythemia (HCC) 06/14/2019   GERD (gastroesophageal reflux disease) 04/15/2019   Chronic low back pain 04/15/2019   Right pulmonary embolus (HCC) 04/15/2019   DOE (dyspnea on exertion) 03/13/2019   Atherosclerosis of both carotid arteries 09/20/2018   Spinal stenosis of lumbar region with neurogenic claudication 09/20/2018   Aortic atherosclerosis (HCC) 09/05/2018   At high risk for falls 09/05/2018   Osteopenia 05/18/2017   Stage 3 chronic kidney disease (HCC) 04/21/2017   Morbid obesity (HCC) 04/21/2017   Trigger middle finger of left hand 02/28/2017   Chronic gouty arthropathy without tophi 11/29/2016   Hypergammaglobulinemia 11/29/2016   Osteoarthritis of both knees 11/29/2016   Bilateral primary osteoarthritis of knee 11/29/2016   Allergic rhinitis, seasonal 07/30/2014   Gout 07/30/2014   HLD (hyperlipidemia) 07/30/2014  Osteoarthritis of knee 07/30/2014   Prediabetes 07/30/2014   Asthma, mild persistent 07/30/2014   COPD (chronic obstructive pulmonary disease) (HCC) 07/09/2008   Essential hypertension 06/22/2006    ONSET DATE: 6 months or so  REFERRING DIAG: R26.89 (ICD-10-CM) - Other abnormalities of gait and mobility  THERAPY DIAG:  Abnormality of gait and mobility  Difficulty in walking, not elsewhere classified  Muscle weakness (generalized)  Unsteadiness on feet  Other abnormalities of gait and mobility  Other lack of coordination  Rationale for Evaluation and  Treatment: Rehabilitation  SUBJECTIVE:                                                                                                                                                                                             SUBJECTIVE STATEMENT: Patient some intermittent swelling in her left wrist but otherwise doing well without any new issues.        Pt accompanied by: self  PERTINENT HISTORY: Patient is a 78 year old female present today for Eval for gait/mobility due to prolonged B/L knee pain pt was hunching forward using walker (per MD referral). Patient has PMH of  2 Total knee replacement in 2024- most recent left knee in October 2024. States she did receive HHPT but no outpatient PT for either knee.   PAIN:  Are you having pain? No  PRECAUTIONS: Fall  RED FLAGS: None   WEIGHT BEARING RESTRICTIONS: No  FALLS: Has patient fallen in last 6 months? No   LIVING ENVIRONMENT: Lives with: lives alone Lives in: House/apartment Stairs: Yes: External: 4 steps; on right going up Has following equipment at home: Single point cane and Walker - 2 wheeled  PLOF: Independent  PATIENT GOALS: I want to be able to stand up straighter and walk without walker  OBJECTIVE:  Note: Objective measures were completed at Evaluation unless otherwise noted.  DIAGNOSTIC FINDINGS: no recent imaging of knee available in chart.  COGNITION: Overall cognitive status: Within functional limits for tasks assessed   SENSATION: WFL  COORDINATION: WNL  EDEMA:  None observed  POSTURE: rounded shoulders, forward head, and increased thoracic kyphosis  LOWER EXTREMITY ROM:     Active  Right Eval Left Eval RLE 06/06/2023 LLE 06/06/2023  Knee flexion 108 97 110 106  Knee extension       (Blank rows = not tested)  LOWER EXTREMITY MMT:    MMT Right Eval Left Eval  Hip flexion 4- 4-  Hip extension    Hip abduction 4- 4-  Hip adduction    Hip internal rotation 4- 4  Hip external  rotation 4 4  Knee  flexion 4 4  Knee extension 4 4  Ankle dorsiflexion 4   Ankle plantarflexion    Ankle inversion    Ankle eversion    (Blank rows = not tested)  TRANSFERS: Assistive device utilized: Single point cane  Sit to stand: Modified independence Stand to sit: Modified independence Chair to chair: Modified independence Floor: Not assessed  GAIT: Gait pattern: step to pattern, decreased arm swing- Right, decreased arm swing- Left, decreased step length- Right, decreased step length- Left, decreased stance time- Right, decreased stance time- Left, and decreased stride length Distance walked: < 100 feet Assistive device utilized: Single point cane Level of assistance: SBA Comments: forward trunk posture, minimal step length bilaterally  FUNCTIONAL TESTS:  5 times sit to stand: 30.63 sec with BUE Support Timed up and go (TUG): 28.1 sec with SPC 10 Meter walk test= 25.07 and 27.2 sec with SPC= 0.38 m/s avg  PATIENT SURVEYS:  LEFS 39/80                                                                                                                             TREATMENT DATE: 07/18/23   Therapeutic Activities: dynamic therapeutic activities  designed to achieve improved functional performance   -Nustep- BUE/BLE- for coordination/LE ROM/BUE/LE strength- interval - L1 x 1 min, L3 x 2 min, L4 x 1 min, L3 x 1 min for total of 5 min   Instruction in Home exercise program:  Low back exercises:  -standing lumbar flex into ext 2 x 10  -Supine bridge 2 x 10  -Lower trunk rotation x 15 reps  LE exercises -Sit to stand without UE support- 2 x 10 -Standing mini lunge squat at counter- 2 x 10  -Standing mini squats at counter 2 x 10             PATIENT EDUCATION: Education details: Purpose of PT; Discussion of post op knee rehab; purpose of functional outcome measure and goals; Plan of care Person educated: Patient Education method: Explanation Education  comprehension: verbalized understanding  HOME EXERCISE PROGRAM: Access Code: [email or web address] URL: [email or web address] Date: 05/03/2023 Prepared by: Marina  Moser  Exercises - Standing March with Counter Support  - 1 x daily - 7 x weekly - 2 sets - 10 reps - 5 hold - Seated Long Arc Quad  - 1 x daily - 7 x weekly - 2 sets - 10 reps - 5 hold - Proper Sit to Stand Technique  - 1 x daily - 7 x weekly - 2 sets - 10 reps - 5 hold  GOALS: Goals reviewed with patient? Yes  SHORT TERM GOALS: Target date: 06/09/2023  Pt will be independent with HEP in order to increase strength and improve  function at home and work Baseline: 06/06/2023- Patient reports good understanding of current HEP and no questions today. Goal status: MET  LONG TERM GOALS: Target date: 07/21/2023  Pt will increase LEFS by at least 9 points in order to  demonstrate significant improvement in lower extremity function. Baseline: EVAL= 39/80; 07/11/2023=52/80 Goal status: MET  2.  Patient will demonstrate improved bilateral knee active knee flex to > 115 deg for improve step ability, don/doff clothes, and ability to get in/out tub. Baseline: EVAL= R-108 deg and L-97 deg; 06/06/2023= 110 deg and 106 deg; 07/11/2023= 115 on right and 106 on left Goal status: PROGRESSING   3.  Pt will decrease 5TSTS by at least 10 seconds in order to demonstrate clinically significant improvement in LE strength. Baseline: EVAL= 30.63 with BUE Support; 06/06/2023= 14.87 sec with BUE Support and still unable to stand without UE Support; 07/11/2023= 18.3 sec with only 1 UE support  Goal status: PROGRESSING  4.  Pt will improve BERG by at least 3 points in order to demonstrate clinically significant improvement in balance.   Baseline: EVAL=To be assessed next visit 4/8: 29; 06/06/2023= 42/56; 07/11/2023= 43/56 Goal status: MET.  5.  Pt will decrease TUG to below 14 seconds/decrease in order to demonstrate decreased fall risk. Baseline: EVAL=  28.1 sec with SPC; 06/06/2023=22.32 sec avg using SPC; 07/11/2023= 21.10 sec with SPC Goal status: PROGRESSING  6.  Pt will increase by at least 0.13 m/s in order to demonstrate clinically significant improvement in community ambulation.  Baseline: EVAL= 0.38 m/s with SPC; 06/06/2023=0.54; 07/11/2023= 0.55 m/s using SPC  Goal status: PROGRESSING  ASSESSMENT:  CLINICAL IMPRESSION: Discussed plan for PT - Patient feels she has made significant progress and understands that she has 2 visits remaining under her current certification. She was agreeable to just work on her home program over last 2 remianing visits and thinks she will be okay with discharge and plan to continue with her HEP at home vs. A gym setting. She responded well to all instruction - and able to use medbridge to see videos of correct activities and techniques. Patient has 1 more remaining visit and will reassess goals and finalize home program.    OBJECTIVE IMPAIRMENTS: Abnormal gait, decreased activity tolerance, decreased balance, decreased coordination, decreased endurance, decreased mobility, difficulty walking, decreased ROM, decreased strength, and hypomobility.   ACTIVITY LIMITATIONS: carrying, lifting, bending, sitting, standing, squatting, stairs, and transfers  PARTICIPATION LIMITATIONS: meal prep, cleaning, laundry, shopping, community activity, and yard work  PERSONAL FACTORS: 1-2 comorbidities: COPD, HTN are also affecting patient's functional outcome.   REHAB POTENTIAL: Good  CLINICAL DECISION MAKING: Evolving/moderate complexity  EVALUATION COMPLEXITY: Moderate  PLAN:  PT FREQUENCY: 1-2x/week  PT DURATION: 12 weeks  PLANNED INTERVENTIONS: 97164- PT Re-evaluation, 97110-Therapeutic exercises, 97530- Therapeutic activity, [id number]- Neuromuscular re-education, 97535- Self Care, [id number]- Manual therapy, [id number]- Gait training, [phone]- Orthotic Fit/training, [phone]- Canalith repositioning, [phone]- Electrical  stimulation (manual), Patient/Family education, Balance training, Stair training, Taping, Dry Needling, Joint mobilization, Joint manipulation, Scar mobilization, Vestibular training, DME instructions, Cryotherapy, and Moist heat  PLAN FOR NEXT SESSION:  Reassess all remaining goals and finalize HEP.  Plan to discharge next visit.   Reyes LOISE London, PT 07/18/2023, 8:48 AM  8:48 AM, 07/18/23

## 2023-07-18 ENCOUNTER — Ambulatory Visit

## 2023-07-18 DIAGNOSIS — R262 Difficulty in walking, not elsewhere classified: Secondary | ICD-10-CM

## 2023-07-18 DIAGNOSIS — R269 Unspecified abnormalities of gait and mobility: Secondary | ICD-10-CM

## 2023-07-18 DIAGNOSIS — R2681 Unsteadiness on feet: Secondary | ICD-10-CM

## 2023-07-18 DIAGNOSIS — M6281 Muscle weakness (generalized): Secondary | ICD-10-CM

## 2023-07-18 DIAGNOSIS — R2689 Other abnormalities of gait and mobility: Secondary | ICD-10-CM

## 2023-07-18 DIAGNOSIS — R278 Other lack of coordination: Secondary | ICD-10-CM

## 2023-07-19 ENCOUNTER — Ambulatory Visit: Admitting: Physical Therapy

## 2023-07-19 NOTE — Therapy (Signed)
 OUTPATIENT PHYSICAL THERAPY NEURO TREATMENT/PT DISCHARGE SUMMARY   Patient Name: Colleen [name] MRN: [id number] DOB:1946/01/03, 78 y.o., female Today's Date: 07/20/2023   PCP: Michelene Cower, PA-C REFERRING PROVIDER: Michelene Cower, PA-C  END OF SESSION:  PT End of Session - 07/20/23 0756     Visit Number 23    Number of Visits 24    Date for PT Re-Evaluation 07/21/23    Progress Note Due on Visit 30    PT Start Time 0758    PT Stop Time 0839    PT Time Calculation (min) 41 min    Equipment Utilized During Treatment Gait belt    Activity Tolerance Patient tolerated treatment well;No increased pain    Behavior During Therapy WFL for tasks assessed/performed                         Past Medical History:  Diagnosis Date   Asthma    Cataract    COPD (chronic obstructive pulmonary disease) (HCC)    GERD (gastroesophageal reflux disease)    Gout    History of pulmonary embolus (PE)    Hyperlipidemia    Hypertension    Osteoarthrosis    Prediabetes    Past Surgical History:  Procedure Laterality Date   BACK SURGERY     BREAST BIOPSY Left 04/08/2014   intraductal papilloma 5 mm   COLONOSCOPY WITH PROPOFOL  N/A 04/23/2020   Procedure: COLONOSCOPY WITH PROPOFOL ;  Surgeon: Janalyn Keene NOVAK, MD;  Location: [facility] ENDOSCOPY;  Service: Endoscopy;  Laterality: N/A;   ESOPHAGOGASTRODUODENOSCOPY (EGD) WITH PROPOFOL  N/A 04/23/2020   Procedure: ESOPHAGOGASTRODUODENOSCOPY (EGD) WITH PROPOFOL ;  Surgeon: Janalyn Keene NOVAK, MD;  Location: [facility] ENDOSCOPY;  Service: Endoscopy;  Laterality: N/A;   GIVENS CAPSULE STUDY N/A 01/28/2022   Procedure: GIVENS CAPSULE STUDY;  Surgeon: Therisa Bi, MD;  Location: [facility] ENDOSCOPY;  Service: Gastroenterology;  Laterality: N/A;   LUMBAR LAMINECTOMY     TONSILLECTOMY     Patient Active Problem List   Diagnosis Date Noted   New onset type 2 diabetes mellitus (HCC) 04/04/2023   Acquired trigger finger of right middle finger 07/24/2021    Acquired trigger finger of right ring finger 07/24/2021   Trigger finger of right hand 06/26/2021   Hidradenitis suppurativa 11/12/2020   Status post gastroplasty-1980s 06/25/2020   CLE (columnar lined esophagus)    Hiatal hernia    Gastric nodule    Angiodysplasia of stomach and duodenum    Cecal polyp    Polyp of descending colon    Polyp of transverse colon    Atrial fibrillation (HCC) 06/14/2019   Current use of anticoagulant therapy 06/14/2019   Iron deficiency anemia 06/14/2019   Essential (hemorrhagic) thrombocythemia (HCC) 06/14/2019   GERD (gastroesophageal reflux disease) 04/15/2019   Chronic low back pain 04/15/2019   Right pulmonary embolus (HCC) 04/15/2019   DOE (dyspnea on exertion) 03/13/2019   Atherosclerosis of both carotid arteries 09/20/2018   Spinal stenosis of lumbar region with neurogenic claudication 09/20/2018   Aortic atherosclerosis (HCC) 09/05/2018   At high risk for falls 09/05/2018   Osteopenia 05/18/2017   Stage 3 chronic kidney disease (HCC) 04/21/2017   Morbid obesity (HCC) 04/21/2017   Trigger middle finger of left hand 02/28/2017   Chronic gouty arthropathy without tophi 11/29/2016   Hypergammaglobulinemia 11/29/2016   Osteoarthritis of both knees 11/29/2016   Bilateral primary osteoarthritis of knee 11/29/2016   Allergic rhinitis, seasonal 07/30/2014   Gout 07/30/2014   HLD (hyperlipidemia)  07/30/2014   Osteoarthritis of knee 07/30/2014   Prediabetes 07/30/2014   Asthma, mild persistent 07/30/2014   COPD (chronic obstructive pulmonary disease) (HCC) 07/09/2008   Essential hypertension 06/22/2006    ONSET DATE: 6 months or so  REFERRING DIAG: R26.89 (ICD-10-CM) - Other abnormalities of gait and mobility  THERAPY DIAG:  Abnormality of gait and mobility  Difficulty in walking, not elsewhere classified  Muscle weakness (generalized)  Unsteadiness on feet  Other abnormalities of gait and mobility  Other lack of  coordination  Rationale for Evaluation and Treatment: Rehabilitation  SUBJECTIVE:                                                                                                                                                                                             SUBJECTIVE STATEMENT: Patient         Pt accompanied by: self  PERTINENT HISTORY: Patient is a 78 year old female present today for Eval for gait/mobility due to prolonged B/L knee pain pt was hunching forward using walker (per MD referral). Patient has PMH of  2 Total knee replacement in 2024- most recent left knee in October 2024. States she did receive HHPT but no outpatient PT for either knee.   PAIN:  Are you having pain? No  PRECAUTIONS: Fall  RED FLAGS: None   WEIGHT BEARING RESTRICTIONS: No  FALLS: Has patient fallen in last 6 months? No   LIVING ENVIRONMENT: Lives with: lives alone Lives in: House/apartment Stairs: Yes: External: 4 steps; on right going up Has following equipment at home: Single point cane and Walker - 2 wheeled  PLOF: Independent  PATIENT GOALS: I want to be able to stand up straighter and walk without walker  OBJECTIVE:  Note: Objective measures were completed at Evaluation unless otherwise noted.  DIAGNOSTIC FINDINGS: no recent imaging of knee available in chart.  COGNITION: Overall cognitive status: Within functional limits for tasks assessed   SENSATION: WFL  COORDINATION: WNL  EDEMA:  None observed  POSTURE: rounded shoulders, forward head, and increased thoracic kyphosis  LOWER EXTREMITY ROM:     Active  Right Eval Left Eval RLE 06/06/2023 LLE 06/06/2023  Knee flexion 108 97 110 106  Knee extension       (Blank rows = not tested)  LOWER EXTREMITY MMT:    MMT Right Eval Left Eval  Hip flexion 4- 4-  Hip extension    Hip abduction 4- 4-  Hip adduction    Hip internal rotation 4- 4  Hip external rotation 4 4  Knee flexion 4 4  Knee extension 4 4   Ankle dorsiflexion  4   Ankle plantarflexion    Ankle inversion    Ankle eversion    (Blank rows = not tested)  TRANSFERS: Assistive device utilized: Single point cane  Sit to stand: Modified independence Stand to sit: Modified independence Chair to chair: Modified independence Floor: Not assessed  GAIT: Gait pattern: step to pattern, decreased arm swing- Right, decreased arm swing- Left, decreased step length- Right, decreased step length- Left, decreased stance time- Right, decreased stance time- Left, and decreased stride length Distance walked: < 100 feet Assistive device utilized: Single point cane Level of assistance: SBA Comments: forward trunk posture, minimal step length bilaterally  FUNCTIONAL TESTS:  5 times sit to stand: 30.63 sec with BUE Support Timed up and go (TUG): 28.1 sec with SPC 10 Meter walk test= 25.07 and 27.2 sec with SPC= 0.38 m/s avg  PATIENT SURVEYS:  LEFS 39/80                                                                                                                             TREATMENT DATE: 07/20/23      Instruction in Home exercise program:  VERBALLY reviewed previously instructed Low back exercises: using medbridge handout: -standing lumbar flex into ext   -Supine bridge  -Lower trunk rotation  *reviewed frequency requested is 3 x 10 reps   LE exercises -Sit to stand without UE support x 5 for test and 5 more for HEP  -Standing mini lunge squat at counter x 5 to demo proper technique -Standing mini squats at counter x 5 to demo proper technique -Standing hip abd with GTB around ankle for resistance- walking in // bars - down and back x 5 -Standing heel raises with 2 sec hold x 15  (Added all LE to HEP and issued handout)  Physical therapy treatment session today consisted of completing assessment of goals and administration of testing as demonstrated and documented in flow sheet, treatment, and goals section of this note.  Addition treatments may be found below.   PT instructed pt in TUG: 15.50 sec with SPC avg sec (average of 3 trials; >13.5 sec indicates increased fall risk)   10 Meter Walk Test: Patient instructed to walk 10 meters (32.8 ft) as quickly and as safely as possible at their normal speed x2 and at a fast speed x2. Time measured from 2 meter mark to 8 meter mark to accommodate ramp-up and ramp-down.  Normal speed 1: 0.64 m/s Normal speed 2: 0.6 m/s Average Normal speed: 0.62 m/s using SPC Cut off scores: <0.4 m/s = household Ambulator, 0.4-0.8 m/s = limited community Ambulator, >0.8 m/s = community Ambulator, >1.2 m/s = crossing a street, <1.0 = increased fall risk MCID 0.05 m/s (small), 0.13 m/s (moderate), 0.06 m/s (significant)  (ANPTA Core Set of Outcome Measures for Adults with Neurologic Conditions, 2018)    Pt performed 5 time sit<>stand (5xSTS):  28 sec WITHOUT UE support. (>15 sec indicates increased fall risk)  PATIENT EDUCATION: Education details: Purpose of PT; Discussion of post op knee rehab; purpose of functional outcome measure and goals; Plan of care Person educated: Patient Education method: Explanation Education comprehension: verbalized understanding  HOME EXERCISE PROGRAM: Access Code: [email or web address] URL: [email or web address] Date: 07/20/2023 Prepared by: Reyes London  Exercises - Supine Bridge  - 3 x weekly - 3 sets - 10 reps - Standing Trunk Flexion at Wall  - 3 x weekly - 3 sets - 10 reps - Supine Lower Trunk Rotation  - 1 x daily - 2 sets - 10 reps - Sit to Stand with Arms Crossed  - 3 x weekly - 3 sets - 10 reps - Half Deadlift with Kettlebell  - 3 x weekly - 3 sets - 10 reps - Mini Squat with Counter Support  - 3 x weekly - 3 sets - 10 reps - Forward Lunge with Counter Support  - 3 x weekly - 3 sets - 10 reps - Standing Hip Abduction with Resistance at Ankles and Counter Support  - 3 x weekly - 3 sets - 10 reps - Heel Raises  with Counter Support  - 3 x weekly - 3 sets - 10 reps      Access Code: [email or web address] URL: [email or web address] Date: 05/03/2023 Prepared by: Marina  Moser  Exercises - Standing March with Counter Support  - 1 x daily - 7 x weekly - 2 sets - 10 reps - 5 hold - Seated Long Arc Quad  - 1 x daily - 7 x weekly - 2 sets - 10 reps - 5 hold - Proper Sit to Stand Technique  - 1 x daily - 7 x weekly - 2 sets - 10 reps - 5 hold  GOALS: Goals reviewed with patient? Yes  SHORT TERM GOALS: Target date: 06/09/2023  Pt will be independent with HEP in order to increase strength and improve  function at home and work Baseline: 06/06/2023- Patient reports good understanding of current HEP and no questions today. Goal status: MET  LONG TERM GOALS: Target date: 07/21/2023  Pt will increase LEFS by at least 9 points in order to demonstrate significant improvement in lower extremity function. Baseline: EVAL= 39/80; 07/11/2023=52/80 Goal status: MET  2.  Patient will demonstrate improved bilateral knee active knee flex to > 115 deg for improve step ability, don/doff clothes, and ability to get in/out tub. Baseline: EVAL= R-108 deg and L-97 deg; 06/06/2023= 110 deg and 106 deg; 07/11/2023= 115 on right and 106 on left; 07/20/2023= 117 on right and 108 deg on Left. Goal status: PROGRESSING   3.  Pt will decrease 5TSTS by at least 10 seconds in order to demonstrate clinically significant improvement in LE strength. Baseline: EVAL= 30.63 with BUE Support; 06/06/2023= 14.87 sec with BUE Support and still unable to stand without UE Support; 07/11/2023= 18.3 sec with only 1 UE support; 07/20/2023= 28 sec WITHOUT UE support.  Goal status: PROGRESSING  4.  Pt will improve BERG by at least 3 points in order to demonstrate clinically significant improvement in balance.   Baseline: EVAL=To be assessed next visit 4/8: 29; 06/06/2023= 42/56; 07/11/2023= 43/56 Goal status: MET.  5.  Pt will decrease TUG to below  14 seconds/decrease in order to demonstrate decreased fall risk. Baseline: EVAL= 28.1 sec with SPC; 06/06/2023=22.32 sec avg using SPC; 07/11/2023= 21.10 sec with SPC; 07/20/2023= 15.50 sec with SPC avg Goal status: PROGRESSING  6.  Pt will increase by at least 0.13 m/s in order to demonstrate clinically significant  improvement in community ambulation.  Baseline: EVAL= 0.38 m/s with SPC; 06/06/2023=0.54; 07/11/2023= 0.55 m/s using SPC; 07/20/2023 = 0.62 m/s  Goal status: MET  ASSESSMENT:  CLINICAL IMPRESSION: Patient presents with good motivation for last visit. She presents overall with much improved LE strength and mobility since initiating PT Services. She went from requiring 2 hands to stand to progressing now to no UE support. Her walking speed has vastly improved indicating a decreased risk of falling. She met her balance goal and walking speed goal as well as improved with all other goals. She has learned Low back and LE activities for her home program and appropriate for discharge with 3/6 goals met and remaining goals with significant improvement. She will continue with her individualized home program on her own.  OBJECTIVE IMPAIRMENTS: Abnormal gait, decreased activity tolerance, decreased balance, decreased coordination, decreased endurance, decreased mobility, difficulty walking, decreased ROM, decreased strength, and hypomobility.   ACTIVITY LIMITATIONS: carrying, lifting, bending, sitting, standing, squatting, stairs, and transfers  PARTICIPATION LIMITATIONS: meal prep, cleaning, laundry, shopping, community activity, and yard work  PERSONAL FACTORS: 1-2 comorbidities: COPD, HTN are also affecting patient's functional outcome.   REHAB POTENTIAL: Good  CLINICAL DECISION MAKING: Evolving/moderate complexity  EVALUATION COMPLEXITY: Moderate  PLAN:  PT FREQUENCY: 1-2x/week  PT DURATION: 12 weeks  PLANNED INTERVENTIONS: [id number]- PT Re-evaluation, 97110-Therapeutic exercises,  97530- Therapeutic activity, [id number]- Neuromuscular re-education, 97535- Self Care, [id number]- Manual therapy, [phone]- Gait training, [phone]- Orthotic Fit/training, [phone]- Canalith repositioning, [phone]- Electrical stimulation (manual), Patient/Family education, Balance training, Stair training, Taping, Dry Needling, Joint mobilization, Joint manipulation, Scar mobilization, Vestibular training, DME instructions, Cryotherapy, and Moist heat  PLAN FOR NEXT SESSION:   Plan to discharge today.   Reyes LOISE London, PT 07/20/2023, 9:13 AM  9:13 AM, 07/20/23

## 2023-07-20 ENCOUNTER — Ambulatory Visit

## 2023-07-20 DIAGNOSIS — R2689 Other abnormalities of gait and mobility: Secondary | ICD-10-CM

## 2023-07-20 DIAGNOSIS — R269 Unspecified abnormalities of gait and mobility: Secondary | ICD-10-CM | POA: Diagnosis not present

## 2023-07-20 DIAGNOSIS — M6281 Muscle weakness (generalized): Secondary | ICD-10-CM

## 2023-07-20 DIAGNOSIS — R278 Other lack of coordination: Secondary | ICD-10-CM

## 2023-07-20 DIAGNOSIS — R2681 Unsteadiness on feet: Secondary | ICD-10-CM

## 2023-07-20 DIAGNOSIS — R262 Difficulty in walking, not elsewhere classified: Secondary | ICD-10-CM

## 2023-07-21 ENCOUNTER — Ambulatory Visit: Admitting: Physical Therapy

## 2023-07-25 ENCOUNTER — Ambulatory Visit

## 2023-07-26 ENCOUNTER — Ambulatory Visit: Admitting: Physical Therapy

## 2023-07-27 ENCOUNTER — Ambulatory Visit

## 2023-07-28 ENCOUNTER — Ambulatory Visit: Admitting: Physical Therapy

## 2023-08-01 ENCOUNTER — Encounter: Payer: Self-pay | Admitting: Family Medicine

## 2023-08-01 ENCOUNTER — Ambulatory Visit: Admitting: Family Medicine

## 2023-08-01 ENCOUNTER — Ambulatory Visit

## 2023-08-01 VITALS — BP 118/70 | HR 72 | Resp 16 | Ht 66.0 in | Wt 183.0 lb

## 2023-08-01 DIAGNOSIS — R634 Abnormal weight loss: Secondary | ICD-10-CM

## 2023-08-01 DIAGNOSIS — R630 Anorexia: Secondary | ICD-10-CM

## 2023-08-01 DIAGNOSIS — D509 Iron deficiency anemia, unspecified: Secondary | ICD-10-CM

## 2023-08-01 DIAGNOSIS — E782 Mixed hyperlipidemia: Secondary | ICD-10-CM | POA: Diagnosis not present

## 2023-08-01 DIAGNOSIS — J449 Chronic obstructive pulmonary disease, unspecified: Secondary | ICD-10-CM

## 2023-08-01 DIAGNOSIS — N1831 Chronic kidney disease, stage 3a: Secondary | ICD-10-CM

## 2023-08-01 DIAGNOSIS — E119 Type 2 diabetes mellitus without complications: Secondary | ICD-10-CM | POA: Diagnosis not present

## 2023-08-01 DIAGNOSIS — I700 Atherosclerosis of aorta: Secondary | ICD-10-CM

## 2023-08-01 DIAGNOSIS — I10 Essential (primary) hypertension: Secondary | ICD-10-CM

## 2023-08-01 DIAGNOSIS — R1010 Upper abdominal pain, unspecified: Secondary | ICD-10-CM

## 2023-08-01 DIAGNOSIS — Z7901 Long term (current) use of anticoagulants: Secondary | ICD-10-CM

## 2023-08-01 DIAGNOSIS — K219 Gastro-esophageal reflux disease without esophagitis: Secondary | ICD-10-CM | POA: Diagnosis not present

## 2023-08-01 DIAGNOSIS — I480 Paroxysmal atrial fibrillation: Secondary | ICD-10-CM

## 2023-08-01 DIAGNOSIS — M109 Gout, unspecified: Secondary | ICD-10-CM

## 2023-08-01 DIAGNOSIS — Z5181 Encounter for therapeutic drug level monitoring: Secondary | ICD-10-CM | POA: Diagnosis not present

## 2023-08-01 LAB — POCT GLYCOSYLATED HEMOGLOBIN (HGB A1C): Hemoglobin A1C: 5.7 % — AB (ref 4.0–5.6)

## 2023-08-01 MED ORDER — ALLOPURINOL 100 MG PO TABS: 200.0000 mg | ORAL_TABLET | Freq: Every day | ORAL | 1 refills | Status: DC

## 2023-08-01 MED ORDER — HYDROCHLOROTHIAZIDE 12.5 MG PO CAPS: 12.5000 mg | ORAL_CAPSULE | Freq: Every day | ORAL | 1 refills | Status: DC

## 2023-08-01 MED ORDER — PANTOPRAZOLE SODIUM 40 MG PO TBEC: 40.0000 mg | DELAYED_RELEASE_TABLET | Freq: Every day | ORAL | 3 refills | Status: DC

## 2023-08-01 NOTE — Progress Notes (Signed)
Name: Colleen [name]   MRN: [id number]    DOB: 11-03-1945   Date:08/01/2023       Progress Note  Chief Complaint  Patient presents with   Medical Management of Chronic Issues    4 month follow-up   Diabetes   Hypertension   Hyperlipidemia     Subjective:   Colleen [name] is a 78 y.o. female, presents to clinic for routine follow up on chronic conditions   Not on meds, she improved her diet and A1c has improved Lab Results  Component Value Date   HGBA1C 5.7 (A) 08/01/2023   HGBA1C 7.8 (H) 03/31/2023   HGBA1C 6.4 (H) 09/30/2022   HGBA1C 5.9 (H) 05/14/2022   HGBA1C 5.5 09/01/2021   Hypertension:  On HCTZ 12.5 and enalapril  2.5 daily BP Readings from Last 3 Encounters:  08/01/23 118/70  06/27/23 (!) 111/98  04/11/23 118/68   Pt denies CP, SOB, exertional sx, LE edema, palpitation, Ha's, visual disturbances, lightheadedness, hypotension, syncope.  HLD on lovastatin   Lab Results  Component Value Date   CHOL 155 09/30/2022   HDL 69 09/30/2022   LDLCALC 71 09/30/2022   TRIG 70 09/30/2022   CHOLHDL 2.2 09/30/2022   GI upset N/V/D in April and since then she's had no appetite  No abd pain, diarrhea or vomiting but she normally likes to eat and she just has no appetite Some indigestion and burning/ GI irritation - her hematology wants her to have EGD again June or May she weighed 193 Wt Readings from Last 5 Encounters:  08/01/23 183 lb (83 kg)  04/11/23 205 lb 12.8 oz (93.4 kg)  03/31/23 204 lb (92.5 kg)  09/30/22 220 lb 6.4 oz (100 kg)  07/26/22 218 lb 12.8 oz (99.2 kg)   BMI Readings from Last 5 Encounters:  08/01/23 29.54 kg/m  04/11/23 33.22 kg/m  03/31/23 32.93 kg/m  09/30/22 35.57 kg/m  07/26/22 35.32 kg/m         Current Outpatient Medications:    albuterol  (VENTOLIN  HFA) 108 (90 Base) MCG/ACT inhaler, Inhale 2 puffs into the lungs every 4 (four) hours as needed for wheezing or shortness of breath., Disp: 8 g, Rfl: 2   allopurinol  (ZYLOPRIM )  100 MG tablet, Take 2 tablets by mouth once daily, Disp: 60 tablet, Rfl: 0   Blood Pressure Monitoring (ADULT BLOOD PRESSURE CUFF LG) KIT, 1 each by Does not apply route daily., Disp: 1 kit, Rfl: 0   Budeson-Glycopyrrol-Formoterol  (BREZTRI  AEROSPHERE) 160-9-4.8 MCG/ACT AERO, Inhale 2 puffs into the lungs 2 (two) times daily., Disp: 3 each, Rfl: 1   cholecalciferol (VITAMIN D3) 25 MCG (1000 UNIT) tablet, Take 1,000 Units by mouth daily., Disp: , Rfl:    ELIQUIS 5 MG TABS tablet, Take 5 mg by mouth 2 (two) times daily., Disp: , Rfl:    enalapril  (VASOTEC ) 2.5 MG tablet, Take 1 tablet (2.5 mg total) by mouth daily., Disp: 90 tablet, Rfl: 3   famotidine  (PEPCID ) 20 MG tablet, Take 20 mg by mouth daily as needed for heartburn or indigestion., Disp: , Rfl:    hydrochlorothiazide  (MICROZIDE ) 12.5 MG capsule, Take 1 capsule by mouth once daily, Disp: 90 capsule, Rfl: 0   lovastatin  (MEVACOR ) 40 MG tablet, Take 1 tablet (40 mg total) by mouth at bedtime., Disp: 90 tablet, Rfl: 1   simethicone  (GAS-X) 80 MG chewable tablet, Chew 1 tablet (80 mg total) by mouth 2 (two) times a day., Disp: 60 tablet, Rfl: 2   triamcinolone  cream (KENALOG ) 0.1 %,  APPLY TO AFFECTED AREA TWICE DAILY IF NEEDED. NO MORE THAN 1 WEEK AT A TIME, THEN TAKE A BREAK FOR 2 WEEKS., Disp: 80 g, Rfl: 0   acetaminophen  (TYLENOL ) 650 MG CR tablet, Take 650 mg by mouth every 8 (eight) hours as needed for pain. (Patient not taking: Reported on 08/01/2023), Disp: , Rfl:   Patient Active Problem List   Diagnosis Date Noted   New onset type 2 diabetes mellitus (HCC) 04/04/2023   Acquired trigger finger of right middle finger 07/24/2021   Acquired trigger finger of right ring finger 07/24/2021   Trigger finger of right hand 06/26/2021   Hidradenitis suppurativa 11/12/2020   Status post gastroplasty-1980s 06/25/2020   CLE (columnar lined esophagus)    Hiatal hernia    Gastric nodule    Angiodysplasia of stomach and duodenum    Cecal polyp     Polyp of descending colon    Polyp of transverse colon    Atrial fibrillation (HCC) 06/14/2019   Current use of anticoagulant therapy 06/14/2019   Iron deficiency anemia 06/14/2019   Essential (hemorrhagic) thrombocythemia (HCC) 06/14/2019   GERD (gastroesophageal reflux disease) 04/15/2019   Chronic low back pain 04/15/2019   Right pulmonary embolus (HCC) 04/15/2019   DOE (dyspnea on exertion) 03/13/2019   Atherosclerosis of both carotid arteries 09/20/2018   Spinal stenosis of lumbar region with neurogenic claudication 09/20/2018   Aortic atherosclerosis (HCC) 09/05/2018   At high risk for falls 09/05/2018   Osteopenia 05/18/2017   Stage 3 chronic kidney disease (HCC) 04/21/2017   Morbid obesity (HCC) 04/21/2017   Trigger middle finger of left hand 02/28/2017   Chronic gouty arthropathy without tophi 11/29/2016   Hypergammaglobulinemia 11/29/2016   Osteoarthritis of both knees 11/29/2016   Bilateral primary osteoarthritis of knee 11/29/2016   Allergic rhinitis, seasonal 07/30/2014   Gout 07/30/2014   HLD (hyperlipidemia) 07/30/2014   Osteoarthritis of knee 07/30/2014   Prediabetes 07/30/2014   Asthma, mild persistent 07/30/2014   COPD (chronic obstructive pulmonary disease) (HCC) 07/09/2008   Essential hypertension 06/22/2006    Past Surgical History:  Procedure Laterality Date   BACK SURGERY     BREAST BIOPSY Left 04/08/2014   intraductal papilloma 5 mm   COLONOSCOPY WITH PROPOFOL  N/A 04/23/2020   Procedure: COLONOSCOPY WITH PROPOFOL ;  Surgeon: Janalyn Keene NOVAK, MD;  Location: [facility] ENDOSCOPY;  Service: Endoscopy;  Laterality: N/A;   ESOPHAGOGASTRODUODENOSCOPY (EGD) WITH PROPOFOL  N/A 04/23/2020   Procedure: ESOPHAGOGASTRODUODENOSCOPY (EGD) WITH PROPOFOL ;  Surgeon: Janalyn Keene NOVAK, MD;  Location: [facility] ENDOSCOPY;  Service: Endoscopy;  Laterality: N/A;   GIVENS CAPSULE STUDY N/A 01/28/2022   Procedure: GIVENS CAPSULE STUDY;  Surgeon: Therisa Bi, MD;  Location: [facility]  ENDOSCOPY;  Service: Gastroenterology;  Laterality: N/A;   LUMBAR LAMINECTOMY     TONSILLECTOMY      Family History  Problem Relation Age of Onset   Diabetes Mother    Lung cancer Father    Diabetes Sister    Congestive Heart Failure Sister    Glaucoma Sister    Breast cancer Maternal Aunt     Social History   Tobacco Use   Smoking status: Former    Current packs/day: 0.00    Average packs/day: 1.5 packs/day for 20.0 years (30.0 ttl pk-yrs)    Types: Cigarettes    Start date: 11/13/1988    Quit date: 11/13/2008    Years since quitting: 14.7   Smokeless tobacco: Never   Tobacco comments:    smoking cessation materials not required  Vaping Use   Vaping status: Never Used  Substance Use Topics   Alcohol use: Not Currently   Drug use: No     Allergies  Allergen Reactions   Nsaids     Other Reaction(s): Not available    Health Maintenance  Topic Date Due   Diabetic kidney evaluation - Urine ACR  Never done   Lung Cancer Screening  Never done   OPHTHALMOLOGY EXAM  08/01/2023 (Originally 04/08/1955)   COVID-19 Vaccine (6 - 2024-25 season) 08/17/2023 (Originally 09/26/2022)   DTaP/Tdap/Td (2 - Td or Tdap) 03/29/2024 (Originally 10/12/2021)   INFLUENZA VACCINE  08/26/2023   Medicare Annual Wellness (AWV)  12/02/2023   HEMOGLOBIN A1C  02/25/2024   Diabetic kidney evaluation - eGFR measurement  03/30/2024   FOOT EXAM  03/30/2024   MAMMOGRAM  04/14/2024   DEXA SCAN  04/14/2025   Colonoscopy  04/23/2025   Pneumococcal Vaccine: 50+ Years  Completed   Hepatitis C Screening  Completed   Hepatitis B Vaccines  Aged Out   HPV VACCINES  Aged Out   Meningococcal B Vaccine  Aged Out   Fecal DNA (Cologuard)  Discontinued   Zoster Vaccines- Shingrix  Discontinued    Chart Review Today: I personally reviewed active problem list, medication list, allergies, family history, social history, health maintenance, notes from last encounter, lab results, imaging with the  patient/caregiver today.   Review of Systems  Constitutional: Negative.   HENT: Negative.    Eyes: Negative.   Respiratory: Negative.    Cardiovascular: Negative.   Gastrointestinal: Negative.   Endocrine: Negative.   Genitourinary: Negative.   Musculoskeletal: Negative.   Skin: Negative.   Allergic/Immunologic: Negative.   Neurological: Negative.   Hematological: Negative.   Psychiatric/Behavioral: Negative.    All other systems reviewed and are negative.    Objective:   Vitals:   08/01/23 0930  BP: 118/70  Pulse: 72  Resp: 16  SpO2: 98%  Weight: 183 lb (83 kg)  Height: 5' 6 (1.676 m)    Body mass index is 29.54 kg/m.  Physical Exam Vitals and nursing note reviewed.  Constitutional:      General: She is not in acute distress.    Appearance: Normal appearance. She is well-developed. She is not ill-appearing, toxic-appearing or diaphoretic.  HENT:     Head: Normocephalic and atraumatic.     Right Ear: External ear normal.     Left Ear: External ear normal.     Nose: Nose normal.  Eyes:     General: No scleral icterus.       Right eye: No discharge.        Left eye: No discharge.     Conjunctiva/sclera: Conjunctivae normal.  Neck:     Trachea: No tracheal deviation.  Cardiovascular:     Rate and Rhythm: Normal rate and regular rhythm.     Pulses: Normal pulses.  Pulmonary:     Effort: Pulmonary effort is normal. No respiratory distress.     Breath sounds: No stridor.  Abdominal:     General: Bowel sounds are normal. There is no distension.     Palpations: Abdomen is soft.     Tenderness: There is no abdominal tenderness. There is no guarding or rebound.  Skin:    General: Skin is warm and dry.     Findings: No rash.  Neurological:     Mental Status: She is alert. Mental status is at baseline.     Motor: No abnormal muscle tone.  Coordination: Coordination normal.     Gait: Gait abnormal (using rolling walker).  Psychiatric:        Mood and  Affect: Mood normal.        Behavior: Behavior normal.        Assessment & Plan:   Problem List Items Addressed This Visit     COPD (chronic obstructive pulmonary disease) (HCC) (Chronic)   Sx stable with current inhales, rarely using rescue inhaler, no recent exacerbations      Benign essential HTN   BP at goal and stable today on current meds, HCTZ 12.5 and enalapril  2.5 mg daily with diet/lifestyle efforts and weight loss BP Readings from Last 3 Encounters:  08/01/23 118/70  06/27/23 (!) 111/98  04/11/23 118/68  No change to meds, refills and labs ordered today       Relevant Medications   hydrochlorothiazide  (MICROZIDE ) 12.5 MG capsule   Other Relevant Orders   Comprehensive metabolic panel with GFR   Gout   Refill needed on allopurinol  Lab Results  Component Value Date   LABURIC 4.8 03/31/2023        Relevant Medications   allopurinol  (ZYLOPRIM ) 100 MG tablet   HLD (hyperlipidemia)   On lovastatin , good compliance no SE or concerns Lab Results  Component Value Date   CHOL 155 09/30/2022   HDL 69 09/30/2022   LDLCALC 71 09/30/2022   TRIG 70 09/30/2022   CHOLHDL 2.2 09/30/2022  Will obtain lipids today with other labs since they will be due in the next ~2 months             Other Relevant Orders   Comprehensive metabolic panel with GFR   Lipid panel   Stage 3 chronic kidney disease (HCC)   Renal function improved from 03/2022 to last labs Lab Results  Component Value Date   EGFR 85 03/31/2023   EGFR 71 09/30/2022   EGFR 54 (L) 05/14/2022   EGFR 58 (L) 03/30/2022   EGFR 43 (L) 03/03/2021  BP well controlled Recheck renal function today       Relevant Orders   Comprehensive metabolic panel with GFR   Aortic atherosclerosis (HCC)   On statin, monitoring            GERD (gastroesophageal reflux disease)   Protonix  40 mg once daily prescribed, pt instructed to start taking daily, close f/up to recheck GI sx, appetite and weight May need GI  f/up, her hematologist was concerned that she needs GI f/up EGD       Relevant Medications   pantoprazole  (PROTONIX ) 40 MG tablet   Atrial fibrillation (HCC)   Anticoagulated on eliquis            Iron deficiency anemia   Per [facility] hematology Last OV and labs reviewed from April this year Recheck iron panel and h/h with her current sx/weight loss      Relevant Orders   CBC with Differential/Platelet   Iron, TIBC and Ferritin Panel   New onset type 2 diabetes mellitus (HCC)   Not on medications, improved her diet significantly   Lab Results  Component Value Date   HGBA1C 5.7 (A) 08/01/2023   HGBA1C 7.8 (H) 03/31/2023   HGBA1C 6.4 (H) 09/30/2022   HGBA1C 5.9 (H) 05/14/2022   HGBA1C 5.5 09/01/2021         Relevant Orders   Urine Microalbumin w/creat. ratio   POCT HgB A1C (Completed)   Comprehensive metabolic panel with GFR   Other Visit Diagnoses  Encounter for monitoring direct oral anticoagulant therapy       Relevant Orders   CBC with Differential/Platelet   Comprehensive metabolic panel with GFR   Iron, TIBC and Ferritin Panel     Unintentional weight loss of 10% body weight within 6 months       more than 22 lbs weight loss, greater than 10%, discussed increasing calories and protein, additional labs added today, close f/up to check weight  Wt Readings from Last 5 Encounters:  08/01/23 183 lb (83 kg)  04/11/23 205 lb 12.8 oz (93.4 kg)  03/31/23 204 lb (92.5 kg)  09/30/22 220 lb 6.4 oz (100 kg)  07/26/22 218 lb 12.8 oz (99.2 kg)   BMI Readings from Last 5 Encounters:  08/01/23 29.54 kg/m  04/11/23 33.22 kg/m  03/31/23 32.93 kg/m  09/30/22 35.57 kg/m  07/26/22 35.32 kg/m   If continued weight loss despite PPI and working on calories would do addition work up/imaging and we did discuss dietician consult today     Relevant Orders   CBC with Differential/Platelet   Comprehensive metabolic panel with GFR   TSH   T4, free     Pain of upper  abdomen       upset stomach with certain foods, rumbling burning, no ttp on exam, restart daily PPI with close f/up   Relevant Medications   pantoprazole  (PROTONIX ) 40 MG tablet   Other Relevant Orders   CBC with Differential/Platelet   Comprehensive metabolic panel with GFR   Lipase     Decreased appetite       diet changes and loss of appetite since April, no abd ttp on exam, trial PPI with close f/up   Relevant Medications   pantoprazole  (PROTONIX ) 40 MG tablet   Other Relevant Orders   Lipase         Return for 1 month appetite/weight loss.   Michelene Cower, PA-C 08/01/23 10:07 AM

## 2023-08-01 NOTE — Assessment & Plan Note (Signed)
 On lovastatin , good compliance no SE or concerns Lab Results  Component Value Date   CHOL 155 09/30/2022   HDL 69 09/30/2022   LDLCALC 71 09/30/2022   TRIG 70 09/30/2022   CHOLHDL 2.2 09/30/2022  Will obtain lipids today with other labs since they will be due in the next ~2 months

## 2023-08-01 NOTE — Assessment & Plan Note (Signed)
 Per [facility] hematology Last OV and labs reviewed from April this year Recheck iron panel and h/h with her current sx/weight loss

## 2023-08-01 NOTE — Patient Instructions (Signed)
 Start the pantoprazole  medication once daily to hopefully help with our appetite and stomach symptoms I want to recheck you and your weight in a month at the latest   Try to increase you calories and protein in your diet.  I will call you with all the labs to let you know if there is anything concerning.

## 2023-08-01 NOTE — Assessment & Plan Note (Signed)
 Sx stable with current inhales, rarely using rescue inhaler, no recent exacerbations

## 2023-08-01 NOTE — Assessment & Plan Note (Signed)
On statin, monitoring 

## 2023-08-01 NOTE — Assessment & Plan Note (Signed)
 Refill needed on allopurinol  Lab Results  Component Value Date   LABURIC 4.8 03/31/2023

## 2023-08-01 NOTE — Assessment & Plan Note (Signed)
 Renal function improved from 03/2022 to last labs Lab Results  Component Value Date   EGFR 85 03/31/2023   EGFR 71 09/30/2022   EGFR 54 (L) 05/14/2022   EGFR 58 (L) 03/30/2022   EGFR 43 (L) 03/03/2021  BP well controlled Recheck renal function today

## 2023-08-01 NOTE — Assessment & Plan Note (Signed)
 Not on medications, improved her diet significantly   Lab Results  Component Value Date   HGBA1C 5.7 (A) 08/01/2023   HGBA1C 7.8 (H) 03/31/2023   HGBA1C 6.4 (H) 09/30/2022   HGBA1C 5.9 (H) 05/14/2022   HGBA1C 5.5 09/01/2021

## 2023-08-01 NOTE — Assessment & Plan Note (Signed)
Anticoagulated on eliquis.

## 2023-08-01 NOTE — Assessment & Plan Note (Signed)
 BP at goal and stable today on current meds, HCTZ 12.5 and enalapril  2.5 mg daily with diet/lifestyle efforts and weight loss BP Readings from Last 3 Encounters:  08/01/23 118/70  06/27/23 (!) 111/98  04/11/23 118/68  No change to meds, refills and labs ordered today

## 2023-08-01 NOTE — Assessment & Plan Note (Signed)
 Protonix  40 mg once daily prescribed, pt instructed to start taking daily, close f/up to recheck GI sx, appetite and weight May need GI f/up, her hematologist was concerned that she needs GI f/up EGD

## 2023-08-02 ENCOUNTER — Ambulatory Visit: Admitting: Physical Therapy

## 2023-08-02 ENCOUNTER — Ambulatory Visit: Payer: Self-pay | Admitting: Family Medicine

## 2023-08-02 DIAGNOSIS — D509 Iron deficiency anemia, unspecified: Secondary | ICD-10-CM

## 2023-08-02 DIAGNOSIS — D473 Essential (hemorrhagic) thrombocythemia: Secondary | ICD-10-CM

## 2023-08-02 DIAGNOSIS — R1010 Upper abdominal pain, unspecified: Secondary | ICD-10-CM

## 2023-08-02 DIAGNOSIS — K838 Other specified diseases of biliary tract: Secondary | ICD-10-CM

## 2023-08-02 DIAGNOSIS — Z7901 Long term (current) use of anticoagulants: Secondary | ICD-10-CM

## 2023-08-02 DIAGNOSIS — K828 Other specified diseases of gallbladder: Secondary | ICD-10-CM

## 2023-08-02 DIAGNOSIS — R630 Anorexia: Secondary | ICD-10-CM

## 2023-08-02 DIAGNOSIS — R634 Abnormal weight loss: Secondary | ICD-10-CM

## 2023-08-02 DIAGNOSIS — R7989 Other specified abnormal findings of blood chemistry: Secondary | ICD-10-CM

## 2023-08-02 DIAGNOSIS — D892 Hypergammaglobulinemia, unspecified: Secondary | ICD-10-CM

## 2023-08-02 DIAGNOSIS — R748 Abnormal levels of other serum enzymes: Secondary | ICD-10-CM

## 2023-08-02 DIAGNOSIS — K5520 Angiodysplasia of colon without hemorrhage: Secondary | ICD-10-CM

## 2023-08-02 DIAGNOSIS — R17 Unspecified jaundice: Secondary | ICD-10-CM

## 2023-08-02 LAB — IRON,TIBC AND FERRITIN PANEL
%SAT: 49 % — ABNORMAL HIGH (ref 16–45)
Ferritin: 1533 ng/mL — ABNORMAL HIGH (ref 16–288)
Iron: 1533 ng/mL — AB (ref 45–288)
TIBC: 202 ug/dL — ABNORMAL LOW (ref 250–450)

## 2023-08-02 LAB — COMPREHENSIVE METABOLIC PANEL WITH GFR
AG Ratio: 1.1 (calc) (ref 1.0–2.5)
ALT: 279 U/L — ABNORMAL HIGH (ref 6–29)
AST: 268 U/L — ABNORMAL HIGH (ref 10–35)
Albumin: 4 g/dL (ref 3.6–5.1)
Alkaline phosphatase (APISO): 1401 U/L — ABNORMAL HIGH (ref 37–153)
BUN: 13 mg/dL (ref 7–25)
CO2: 27 mmol/L (ref 20–32)
Calcium: 10 mg/dL (ref 8.6–10.4)
Chloride: 95 mmol/L — ABNORMAL LOW (ref 98–110)
Creat: 0.77 mg/dL (ref 0.60–1.00)
Globulin: 3.6 g/dL (ref 1.9–3.7)
Glucose, Bld: 105 mg/dL — ABNORMAL HIGH (ref 65–99)
Potassium: 4 mmol/L (ref 3.5–5.3)
Sodium: 133 mmol/L — ABNORMAL LOW (ref 135–146)
Total Bilirubin: 3.5 mg/dL — ABNORMAL HIGH (ref 0.2–1.2)
Total Protein: 7.6 g/dL (ref 6.1–8.1)
eGFR: 79 mL/min/1.73m2 (ref >=60)

## 2023-08-02 LAB — CBC WITH DIFFERENTIAL/PLATELET
Absolute Lymphocytes: 2580 {cells}/uL (ref 850–3900)
Absolute Monocytes: 439 {cells}/uL (ref 200–950)
Basophils Absolute: 92 {cells}/uL (ref 0–200)
Basophils Relative: 1.5 %
Eosinophils Absolute: 61 {cells}/uL (ref 15–500)
Eosinophils Relative: 1 %
HCT: 40.2 % (ref 35.0–45.0)
Hemoglobin: 13.4 g/dL (ref 11.7–15.5)
MCH: 31.2 pg (ref 27.0–33.0)
MCHC: 33.3 g/dL (ref 32.0–36.0)
MCV: 93.5 fL (ref 80.0–100.0)
MPV: 10.7 fL (ref 7.5–12.5)
Monocytes Relative: 7.2 %
Neutro Abs: 2928 {cells}/uL (ref 1500–7800)
Neutrophils Relative %: 48 %
Platelets: 343 Thousand/uL (ref 140–400)
RBC: 4.3 Million/uL (ref 3.80–5.10)
RDW: 12.4 % (ref 11.0–15.0)
Total Lymphocyte: 42.3 %
WBC: 6.1 Thousand/uL (ref 3.8–10.8)

## 2023-08-02 LAB — MICROALBUMIN / CREATININE URINE RATIO
Creatinine, Urine: 50 mg/dL (ref 20–275)
Microalb Creat Ratio: 12 mg/g{creat} (ref <30)
Microalb, Ur: 0.6 mg/dL

## 2023-08-02 LAB — T4, FREE: Free T4: 1.4 ng/dL (ref 0.8–1.8)

## 2023-08-02 LAB — LIPID PANEL
Cholesterol: 205 mg/dL — ABNORMAL HIGH (ref <200)
HDL: 101 mg/dL (ref >=50)
LDL Cholesterol (Calc): 89 mg/dL
Non-HDL Cholesterol (Calc): 104 mg/dL (ref <130)
Total CHOL/HDL Ratio: 2 (calc) (ref <5.0)
Triglycerides: 65 mg/dL (ref <150)

## 2023-08-02 LAB — LIPASE: Lipase: 5 U/L — ABNORMAL LOW (ref 7–60)

## 2023-08-02 LAB — TSH: TSH: 1.97 m[IU]/L (ref 0.40–4.50)

## 2023-08-03 ENCOUNTER — Encounter: Payer: Self-pay | Admitting: Family Medicine

## 2023-08-03 ENCOUNTER — Ambulatory Visit

## 2023-08-03 ENCOUNTER — Ambulatory Visit (INDEPENDENT_AMBULATORY_CARE_PROVIDER_SITE_OTHER): Admitting: Family Medicine

## 2023-08-03 VITALS — BP 122/70 | HR 77 | Resp 16 | Ht 66.0 in | Wt 183.0 lb

## 2023-08-03 DIAGNOSIS — K5520 Angiodysplasia of colon without hemorrhage: Secondary | ICD-10-CM | POA: Diagnosis not present

## 2023-08-03 DIAGNOSIS — R17 Unspecified jaundice: Secondary | ICD-10-CM

## 2023-08-03 DIAGNOSIS — R630 Anorexia: Secondary | ICD-10-CM

## 2023-08-03 DIAGNOSIS — R1010 Upper abdominal pain, unspecified: Secondary | ICD-10-CM | POA: Diagnosis not present

## 2023-08-03 DIAGNOSIS — R39198 Other difficulties with micturition: Secondary | ICD-10-CM | POA: Diagnosis not present

## 2023-08-03 DIAGNOSIS — R7989 Other specified abnormal findings of blood chemistry: Secondary | ICD-10-CM

## 2023-08-03 DIAGNOSIS — R748 Abnormal levels of other serum enzymes: Secondary | ICD-10-CM | POA: Diagnosis not present

## 2023-08-03 DIAGNOSIS — R634 Abnormal weight loss: Secondary | ICD-10-CM | POA: Diagnosis not present

## 2023-08-03 NOTE — Progress Notes (Signed)
 Patient ID: [name], female    DOB: 01/23/1946, 78 y.o.   MRN: [id number]  PCP: Colleen Mole, PA-C  Chief Complaint  Patient presents with   Consult    High liver enzyme labs. Would like discuss and do repeat labs today.    Subjective:   Colleen [name] is a 78 y.o. female, presents to clinic with CC of the following:  HPI  Back to review abnormal labs Elevated AST, ALT, Alk Phos, and bili, she also notes for the past 2 weeks stool is lighter in color and urine is darker.  Stool a light brown, not loose or greasy Urine normally near clear and she states its yellow She has not had any abd pain, yellowing of skin or eyes, itching/rash She took protonix  and already had some improvement in GI sx and appetite  Results for orders placed or performed in visit on 08/01/23  POCT HgB A1C   Collection Time: 08/01/23  9:58 AM  Result Value Ref Range   Hemoglobin A1C 5.7 (A) 4.0 - 5.6 %   HbA1c POC (<> result, manual entry)     HbA1c, POC (prediabetic range)     HbA1c, POC (controlled diabetic range)    Urine Microalbumin w/creat. ratio   Collection Time: 08/01/23 10:45 AM  Result Value Ref Range   Creatinine, Urine 50 20 - 275 mg/dL   Microalb, Ur 0.6 mg/dL   Microalb Creat Ratio 12 <30 mg/g creat  CBC with Differential/Platelet   Collection Time: 08/01/23 10:45 AM  Result Value Ref Range   WBC 6.1 3.8 - 10.8 Thousand/uL   RBC 4.30 3.80 - 5.10 Million/uL   Hemoglobin 13.4 11.7 - 15.5 g/dL   HCT 59.7 64.9 - 54.9 %   MCV 93.5 80.0 - 100.0 fL   MCH 31.2 27.0 - 33.0 pg   MCHC 33.3 32.0 - 36.0 g/dL   RDW 87.5 88.9 - 84.9 %   Platelets 343 140 - 400 Thousand/uL   MPV 10.7 7.5 - 12.5 fL   Neutro Abs 2,928 1,500 - 7,800 cells/uL   Absolute Lymphocytes 2,580 850 - 3,900 cells/uL   Absolute Monocytes 439 200 - 950 cells/uL   Eosinophils Absolute 61 15 - 500 cells/uL   Basophils Absolute 92 0 - 200 cells/uL   Neutrophils Relative % 48 %   Total Lymphocyte 42.3 %   Monocytes  Relative 7.2 %   Eosinophils Relative 1.0 %   Basophils Relative 1.5 %  Comprehensive metabolic panel with GFR   Collection Time: 08/01/23 10:45 AM  Result Value Ref Range   Glucose, Bld 105 (H) 65 - 99 mg/dL   BUN 13 7 - 25 mg/dL   Creat 9.22 9.39 - 8.99 mg/dL   eGFR 79 > OR = 60 fO/fpw/8.26f7   BUN/Creatinine Ratio SEE NOTE: 6 - 22 (calc)   Sodium 133 (L) 135 - 146 mmol/L   Potassium 4.0 3.5 - 5.3 mmol/L   Chloride 95 (L) 98 - 110 mmol/L   CO2 27 20 - 32 mmol/L   Calcium  10.0 8.6 - 10.4 mg/dL   Total Protein 7.6 6.1 - 8.1 g/dL   Albumin 4.0 3.6 - 5.1 g/dL   Globulin 3.6 1.9 - 3.7 g/dL (calc)   AG Ratio 1.1 1.0 - 2.5 (calc)   Total Bilirubin 3.5 (H) 0.2 - 1.2 mg/dL   Alkaline phosphatase (APISO) 1,401 (H) 37 - 153 U/L   AST 268 (H) 10 - 35 U/L   ALT 279 (  H) 6 - 29 U/L  Lipid panel   Collection Time: 08/01/23 10:45 AM  Result Value Ref Range   Cholesterol 205 (H) <200 mg/dL   HDL 898 > OR = 50 mg/dL   Triglycerides 65 <849 mg/dL   LDL Cholesterol (Calc) 89 mg/dL (calc)   Total CHOL/HDL Ratio 2.0 <5.0 (calc)   Non-HDL Cholesterol (Calc) 104 <130 mg/dL (calc)  Iron, TIBC and Ferritin Panel   Collection Time: 08/01/23 10:45 AM  Result Value Ref Range   Iron 99 45 - 160 mcg/dL   TIBC 797 (L) 749 - 549 mcg/dL (calc)   %SAT 49 (H) 16 - 45 % (calc)   Ferritin 1,533 (H) 16 - 288 ng/mL  TSH   Collection Time: 08/01/23 10:45 AM  Result Value Ref Range   TSH 1.97 0.40 - 4.50 mIU/L  Lipase   Collection Time: 08/01/23 10:45 AM  Result Value Ref Range   Lipase 5 (L) 7 - 60 U/L  T4, free   Collection Time: 08/01/23 10:45 AM  Result Value Ref Range   Free T4 1.4 0.8 - 1.8 ng/dL      Patient Active Problem List   Diagnosis Date Noted   New onset type 2 diabetes mellitus (HCC) 04/04/2023   Acquired trigger finger of right middle finger 07/24/2021   Acquired trigger finger of right ring finger 07/24/2021   Trigger finger of right hand 06/26/2021   Hidradenitis suppurativa  11/12/2020   Status post gastroplasty-1980s 06/25/2020   CLE (columnar lined esophagus)    Hiatal hernia    Gastric nodule    Angiodysplasia of stomach and duodenum    Cecal polyp    Polyp of descending colon    Polyp of transverse colon    Atrial fibrillation (HCC) 06/14/2019   Current use of anticoagulant therapy 06/14/2019   Iron deficiency anemia 06/14/2019   Essential (hemorrhagic) thrombocythemia (HCC) 06/14/2019   GERD (gastroesophageal reflux disease) 04/15/2019   Chronic low back pain 04/15/2019   Right pulmonary embolus (HCC) 04/15/2019   DOE (dyspnea on exertion) 03/13/2019   Atherosclerosis of both carotid arteries 09/20/2018   Spinal stenosis of lumbar region with neurogenic claudication 09/20/2018   Aortic atherosclerosis (HCC) 09/05/2018   At high risk for falls 09/05/2018   Osteopenia 05/18/2017   Stage 3 chronic kidney disease (HCC) 04/21/2017   Morbid obesity (HCC) 04/21/2017   Trigger middle finger of left hand 02/28/2017   Chronic gouty arthropathy without tophi 11/29/2016   Hypergammaglobulinemia 11/29/2016   Osteoarthritis of both knees 11/29/2016   Bilateral primary osteoarthritis of knee 11/29/2016   Allergic rhinitis, seasonal 07/30/2014   Gout 07/30/2014   HLD (hyperlipidemia) 07/30/2014   Osteoarthritis of knee 07/30/2014   Prediabetes 07/30/2014   Asthma, mild persistent 07/30/2014   COPD (chronic obstructive pulmonary disease) (HCC) 07/09/2008   Benign essential HTN 06/22/2006      Current Outpatient Medications:    albuterol  (VENTOLIN  HFA) 108 (90 Base) MCG/ACT inhaler, Inhale 2 puffs into the lungs every 4 (four) hours as needed for wheezing or shortness of breath., Disp: 8 g, Rfl: 2   allopurinol  (ZYLOPRIM ) 100 MG tablet, Take 2 tablets (200 mg total) by mouth daily., Disp: 180 tablet, Rfl: 1   Blood Pressure Monitoring (ADULT BLOOD PRESSURE CUFF LG) KIT, 1 each by Does not apply route daily., Disp: 1 kit, Rfl: 0    Budeson-Glycopyrrol-Formoterol  (BREZTRI  AEROSPHERE) 160-9-4.8 MCG/ACT AERO, Inhale 2 puffs into the lungs 2 (two) times daily., Disp: 3 each, Rfl: 1  cholecalciferol (VITAMIN D3) 25 MCG (1000 UNIT) tablet, Take 1,000 Units by mouth daily., Disp: , Rfl:    ELIQUIS 5 MG TABS tablet, Take 5 mg by mouth 2 (two) times daily., Disp: , Rfl:    enalapril  (VASOTEC ) 2.5 MG tablet, Take 1 tablet (2.5 mg total) by mouth daily., Disp: 90 tablet, Rfl: 3   famotidine  (PEPCID ) 20 MG tablet, Take 20 mg by mouth daily as needed for heartburn or indigestion., Disp: , Rfl:    hydrochlorothiazide  (MICROZIDE ) 12.5 MG capsule, Take 1 capsule (12.5 mg total) by mouth daily., Disp: 90 capsule, Rfl: 1   lovastatin  (MEVACOR ) 40 MG tablet, Take 1 tablet (40 mg total) by mouth at bedtime., Disp: 90 tablet, Rfl: 1   pantoprazole  (PROTONIX ) 40 MG tablet, Take 1 tablet (40 mg total) by mouth daily., Disp: 30 tablet, Rfl: 3   simethicone  (GAS-X) 80 MG chewable tablet, Chew 1 tablet (80 mg total) by mouth 2 (two) times a day., Disp: 60 tablet, Rfl: 2   triamcinolone  cream (KENALOG ) 0.1 %, APPLY TO AFFECTED AREA TWICE DAILY IF NEEDED. NO MORE THAN 1 WEEK AT A TIME, THEN TAKE A BREAK FOR 2 WEEKS., Disp: 80 g, Rfl: 0   acetaminophen  (TYLENOL ) 650 MG CR tablet, Take 650 mg by mouth every 8 (eight) hours as needed for pain. (Patient not taking: Reported on 08/03/2023), Disp: , Rfl:    Allergies  Allergen Reactions   Nsaids     Other Reaction(s): Not available     Social History   Tobacco Use   Smoking status: Former    Current packs/day: 0.00    Average packs/day: 1.5 packs/day for 20.0 years (30.0 ttl pk-yrs)    Types: Cigarettes    Start date: 11/13/1988    Quit date: 11/13/2008    Years since quitting: 14.7   Smokeless tobacco: Never   Tobacco comments:    smoking cessation materials not required  Vaping Use   Vaping status: Never Used  Substance Use Topics   Alcohol use: Not Currently   Drug use: No      Chart  Review Today: I personally reviewed active problem list, medication list, allergies, family history, social history, health maintenance, notes from last encounter, lab results, imaging with the patient/caregiver today.   Review of Systems  Constitutional: Negative.   HENT: Negative.    Eyes: Negative.   Respiratory: Negative.    Cardiovascular: Negative.   Gastrointestinal: Negative.   Endocrine: Negative.   Genitourinary: Negative.   Musculoskeletal: Negative.   Skin: Negative.   Allergic/Immunologic: Negative.   Neurological: Negative.   Hematological: Negative.   Psychiatric/Behavioral: Negative.    All other systems reviewed and are negative.      Objective:   Vitals:   08/03/23 1110  BP: 122/70  Pulse: 77  Resp: 16  SpO2: 98%  Weight: 183 lb (83 kg)  Height: 5' 6 (1.676 m)    Body mass index is 29.54 kg/m.  Physical Exam Vitals and nursing note reviewed.  Constitutional:      General: She is not in acute distress.    Appearance: Normal appearance. She is well-developed and overweight. She is not ill-appearing, toxic-appearing or diaphoretic.  HENT:     Head: Normocephalic and atraumatic.     Right Ear: External ear normal.     Left Ear: External ear normal.     Nose: Nose normal.  Eyes:     General: No scleral icterus.       Right eye:  No discharge.        Left eye: No discharge.     Conjunctiva/sclera: Conjunctivae normal.  Neck:     Trachea: No tracheal deviation.  Cardiovascular:     Rate and Rhythm: Normal rate.  Pulmonary:     Effort: Pulmonary effort is normal. No respiratory distress.     Breath sounds: No stridor.  Abdominal:     General: Abdomen is flat. Bowel sounds are normal.     Palpations: Abdomen is soft. There is no hepatomegaly, splenomegaly, mass or pulsatile mass.     Tenderness: There is no abdominal tenderness. There is no right CVA tenderness, left CVA tenderness, guarding or rebound. Negative signs include Murphy's sign.      Hernia: No hernia is present.  Skin:    General: Skin is warm and dry.     Findings: No rash.  Neurological:     Mental Status: She is alert.     Motor: No abnormal muscle tone.     Coordination: Coordination normal.     Gait: Gait normal.  Psychiatric:        Mood and Affect: Mood normal.        Behavior: Behavior normal. Behavior is cooperative.      Results for orders placed or performed in visit on 08/01/23  POCT HgB A1C   Collection Time: 08/01/23  9:58 AM  Result Value Ref Range   Hemoglobin A1C 5.7 (A) 4.0 - 5.6 %   HbA1c POC (<> result, manual entry)     HbA1c, POC (prediabetic range)     HbA1c, POC (controlled diabetic range)    Urine Microalbumin w/creat. ratio   Collection Time: 08/01/23 10:45 AM  Result Value Ref Range   Creatinine, Urine 50 20 - 275 mg/dL   Microalb, Ur 0.6 mg/dL   Microalb Creat Ratio 12 <30 mg/g creat  CBC with Differential/Platelet   Collection Time: 08/01/23 10:45 AM  Result Value Ref Range   WBC 6.1 3.8 - 10.8 Thousand/uL   RBC 4.30 3.80 - 5.10 Million/uL   Hemoglobin 13.4 11.7 - 15.5 g/dL   HCT 59.7 64.9 - 54.9 %   MCV 93.5 80.0 - 100.0 fL   MCH 31.2 27.0 - 33.0 pg   MCHC 33.3 32.0 - 36.0 g/dL   RDW 87.5 88.9 - 84.9 %   Platelets 343 140 - 400 Thousand/uL   MPV 10.7 7.5 - 12.5 fL   Neutro Abs 2,928 1,500 - 7,800 cells/uL   Absolute Lymphocytes 2,580 850 - 3,900 cells/uL   Absolute Monocytes 439 200 - 950 cells/uL   Eosinophils Absolute 61 15 - 500 cells/uL   Basophils Absolute 92 0 - 200 cells/uL   Neutrophils Relative % 48 %   Total Lymphocyte 42.3 %   Monocytes Relative 7.2 %   Eosinophils Relative 1.0 %   Basophils Relative 1.5 %  Comprehensive metabolic panel with GFR   Collection Time: 08/01/23 10:45 AM  Result Value Ref Range   Glucose, Bld 105 (H) 65 - 99 mg/dL   BUN 13 7 - 25 mg/dL   Creat 9.22 9.39 - 8.99 mg/dL   eGFR 79 > OR = 60 fO/fpw/8.26f7   BUN/Creatinine Ratio SEE NOTE: 6 - 22 (calc)   Sodium 133 (L) 135 -  146 mmol/L   Potassium 4.0 3.5 - 5.3 mmol/L   Chloride 95 (L) 98 - 110 mmol/L   CO2 27 20 - 32 mmol/L   Calcium  10.0 8.6 - 10.4 mg/dL  Total Protein 7.6 6.1 - 8.1 g/dL   Albumin 4.0 3.6 - 5.1 g/dL   Globulin 3.6 1.9 - 3.7 g/dL (calc)   AG Ratio 1.1 1.0 - 2.5 (calc)   Total Bilirubin 3.5 (H) 0.2 - 1.2 mg/dL   Alkaline phosphatase (APISO) 1,401 (H) 37 - 153 U/L   AST 268 (H) 10 - 35 U/L   ALT 279 (H) 6 - 29 U/L  Lipid panel   Collection Time: 08/01/23 10:45 AM  Result Value Ref Range   Cholesterol 205 (H) <200 mg/dL   HDL 898 > OR = 50 mg/dL   Triglycerides 65 <849 mg/dL   LDL Cholesterol (Calc) 89 mg/dL (calc)   Total CHOL/HDL Ratio 2.0 <5.0 (calc)   Non-HDL Cholesterol (Calc) 104 <130 mg/dL (calc)  Iron, TIBC and Ferritin Panel   Collection Time: 08/01/23 10:45 AM  Result Value Ref Range   Iron 99 45 - 160 mcg/dL   TIBC 797 (L) 749 - 549 mcg/dL (calc)   %SAT 49 (H) 16 - 45 % (calc)   Ferritin 1,533 (H) 16 - 288 ng/mL  TSH   Collection Time: 08/01/23 10:45 AM  Result Value Ref Range   TSH 1.97 0.40 - 4.50 mIU/L  Lipase   Collection Time: 08/01/23 10:45 AM  Result Value Ref Range   Lipase 5 (L) 7 - 60 U/L  T4, free   Collection Time: 08/01/23 10:45 AM  Result Value Ref Range   Free T4 1.4 0.8 - 1.8 ng/dL       Assessment & Plan:   Pt returned today to discuss abnormal labs - namely AST/ALT almost 10x upper normal limits - new, elevated bili, alk phos, ferritin No abd ttp on exam She reports new today some stool pallor and darkening of urine  No overt scleral icteris Labs and US  ordered yesterday - labs today, add urine micro US  scheduled for tomorrow morning Discussed coordination of care with GI now at [facility], later est with local hematology, and once we get results we may need to order other imaging or tests.  She can continue PPI for now Continue to avoid supplements, ETOH, tylenol  and still ER precautions  1. Elevated LFTs (Primary)  - Urine  Microscopic  2. Elevated alkaline phosphatase level  - Urine Microscopic  3. Decreased appetite   4. Unintentional weight loss of 10% body weight within 6 months   5. Pain of upper abdomen   6. GI AVM (gastrointestinal arteriovenous vascular malformation)   7. Abnormal urination  - Urine Microscopic  8. Elevated bilirubin  - Urine Microscopic   We will be closely following lab results and US    Michelene Cower, PA-C 08/03/23 11:46 AM

## 2023-08-03 NOTE — Addendum Note (Signed)
 Addended by: Franshesca Chipman on: 08/03/2023 05:04 PM   Modules accepted: Level of Service

## 2023-08-04 ENCOUNTER — Inpatient Hospital Stay

## 2023-08-04 ENCOUNTER — Telehealth: Payer: Self-pay

## 2023-08-04 ENCOUNTER — Other Ambulatory Visit

## 2023-08-04 ENCOUNTER — Other Ambulatory Visit: Payer: Self-pay

## 2023-08-04 ENCOUNTER — Inpatient Hospital Stay
Admission: EM | Admit: 2023-08-04 | Discharge: 2023-08-09 | DRG: 438 | Disposition: A | Discharge status: Home / Self Care | Attending: Internal Medicine | Admitting: Internal Medicine

## 2023-08-04 ENCOUNTER — Ambulatory Visit: Admitting: Physical Therapy

## 2023-08-04 ENCOUNTER — Encounter: Payer: Self-pay | Admitting: Emergency Medicine

## 2023-08-04 ENCOUNTER — Ambulatory Visit
Admission: RE | Admit: 2023-08-04 | Discharge: 2023-08-04 | Disposition: A | Discharge status: Home / Self Care | Source: Ambulatory Visit | Attending: Family Medicine | Admitting: Family Medicine

## 2023-08-04 DIAGNOSIS — K219 Gastro-esophageal reflux disease without esophagitis: Secondary | ICD-10-CM | POA: Diagnosis present

## 2023-08-04 DIAGNOSIS — K8689 Other specified diseases of pancreas: Principal | ICD-10-CM | POA: Diagnosis present

## 2023-08-04 DIAGNOSIS — R748 Abnormal levels of other serum enzymes: Secondary | ICD-10-CM | POA: Insufficient documentation

## 2023-08-04 DIAGNOSIS — J4489 Other specified chronic obstructive pulmonary disease: Secondary | ICD-10-CM | POA: Diagnosis present

## 2023-08-04 DIAGNOSIS — J449 Chronic obstructive pulmonary disease, unspecified: Secondary | ICD-10-CM | POA: Diagnosis present

## 2023-08-04 DIAGNOSIS — I129 Hypertensive chronic kidney disease with stage 1 through stage 4 chronic kidney disease, or unspecified chronic kidney disease: Secondary | ICD-10-CM | POA: Diagnosis present

## 2023-08-04 DIAGNOSIS — R630 Anorexia: Secondary | ICD-10-CM | POA: Insufficient documentation

## 2023-08-04 DIAGNOSIS — R7401 Elevation of levels of liver transaminase levels: Principal | ICD-10-CM | POA: Diagnosis present

## 2023-08-04 DIAGNOSIS — Z7901 Long term (current) use of anticoagulants: Secondary | ICD-10-CM

## 2023-08-04 DIAGNOSIS — I10 Essential (primary) hypertension: Secondary | ICD-10-CM | POA: Diagnosis not present

## 2023-08-04 DIAGNOSIS — Z8249 Family history of ischemic heart disease and other diseases of the circulatory system: Secondary | ICD-10-CM | POA: Diagnosis not present

## 2023-08-04 DIAGNOSIS — T85590A Other mechanical complication of bile duct prosthesis, initial encounter: Secondary | ICD-10-CM | POA: Diagnosis not present

## 2023-08-04 DIAGNOSIS — R7989 Other specified abnormal findings of blood chemistry: Secondary | ICD-10-CM | POA: Diagnosis not present

## 2023-08-04 DIAGNOSIS — Z886 Allergy status to analgesic agent status: Secondary | ICD-10-CM

## 2023-08-04 DIAGNOSIS — R1010 Upper abdominal pain, unspecified: Secondary | ICD-10-CM | POA: Insufficient documentation

## 2023-08-04 DIAGNOSIS — Z801 Family history of malignant neoplasm of trachea, bronchus and lung: Secondary | ICD-10-CM | POA: Diagnosis not present

## 2023-08-04 DIAGNOSIS — N1831 Chronic kidney disease, stage 3a: Secondary | ICD-10-CM | POA: Diagnosis not present

## 2023-08-04 DIAGNOSIS — I482 Chronic atrial fibrillation, unspecified: Secondary | ICD-10-CM | POA: Diagnosis present

## 2023-08-04 DIAGNOSIS — Z86711 Personal history of pulmonary embolism: Secondary | ICD-10-CM | POA: Diagnosis not present

## 2023-08-04 DIAGNOSIS — E66811 Obesity, class 1: Secondary | ICD-10-CM | POA: Diagnosis present

## 2023-08-04 DIAGNOSIS — Z83511 Family history of glaucoma: Secondary | ICD-10-CM

## 2023-08-04 DIAGNOSIS — Z803 Family history of malignant neoplasm of breast: Secondary | ICD-10-CM | POA: Diagnosis not present

## 2023-08-04 DIAGNOSIS — R634 Abnormal weight loss: Secondary | ICD-10-CM | POA: Insufficient documentation

## 2023-08-04 DIAGNOSIS — Z6831 Body mass index (BMI) 31.0-31.9, adult: Secondary | ICD-10-CM

## 2023-08-04 DIAGNOSIS — R935 Abnormal findings on diagnostic imaging of other abdominal regions, including retroperitoneum: Secondary | ICD-10-CM

## 2023-08-04 DIAGNOSIS — R932 Abnormal findings on diagnostic imaging of liver and biliary tract: Secondary | ICD-10-CM | POA: Diagnosis not present

## 2023-08-04 DIAGNOSIS — Z79899 Other long term (current) drug therapy: Secondary | ICD-10-CM

## 2023-08-04 DIAGNOSIS — R7303 Prediabetes: Secondary | ICD-10-CM | POA: Diagnosis present

## 2023-08-04 DIAGNOSIS — Z87891 Personal history of nicotine dependence: Secondary | ICD-10-CM

## 2023-08-04 DIAGNOSIS — M109 Gout, unspecified: Secondary | ICD-10-CM | POA: Diagnosis present

## 2023-08-04 DIAGNOSIS — K7689 Other specified diseases of liver: Secondary | ICD-10-CM | POA: Diagnosis not present

## 2023-08-04 DIAGNOSIS — I4891 Unspecified atrial fibrillation: Secondary | ICD-10-CM | POA: Diagnosis not present

## 2023-08-04 DIAGNOSIS — E876 Hypokalemia: Secondary | ICD-10-CM | POA: Diagnosis not present

## 2023-08-04 DIAGNOSIS — E669 Obesity, unspecified: Secondary | ICD-10-CM | POA: Diagnosis not present

## 2023-08-04 DIAGNOSIS — K831 Obstruction of bile duct: Secondary | ICD-10-CM | POA: Diagnosis not present

## 2023-08-04 DIAGNOSIS — E785 Hyperlipidemia, unspecified: Secondary | ICD-10-CM | POA: Diagnosis not present

## 2023-08-04 DIAGNOSIS — Z833 Family history of diabetes mellitus: Secondary | ICD-10-CM

## 2023-08-04 DIAGNOSIS — K828 Other specified diseases of gallbladder: Secondary | ICD-10-CM | POA: Diagnosis not present

## 2023-08-04 DIAGNOSIS — K838 Other specified diseases of biliary tract: Secondary | ICD-10-CM | POA: Diagnosis not present

## 2023-08-04 DIAGNOSIS — I2699 Other pulmonary embolism without acute cor pulmonale: Secondary | ICD-10-CM | POA: Diagnosis present

## 2023-08-04 DIAGNOSIS — R112 Nausea with vomiting, unspecified: Secondary | ICD-10-CM | POA: Diagnosis not present

## 2023-08-04 DIAGNOSIS — N281 Cyst of kidney, acquired: Secondary | ICD-10-CM | POA: Diagnosis not present

## 2023-08-04 LAB — MAGNESIUM: Magnesium: 2 mg/dL (ref 1.7–2.4)

## 2023-08-04 LAB — COMPREHENSIVE METABOLIC PANEL WITH GFR
AG Ratio: 1.1 (calc) (ref 1.0–2.5)
ALT: 223 U/L — ABNORMAL HIGH (ref 6–29)
ALT: 172 U/L — ABNORMAL HIGH (ref 0–44)
AST: 184 U/L — ABNORMAL HIGH (ref 10–35)
AST: 146 U/L — ABNORMAL HIGH (ref 15–41)
Albumin: 4.2 g/dL (ref 3.6–5.1)
Albumin: 3.7 g/dL (ref 3.5–5.0)
Alkaline Phosphatase: 1134 U/L — ABNORMAL HIGH (ref 38–126)
Alkaline phosphatase (APISO): 1427 U/L — ABNORMAL HIGH (ref 37–153)
Anion gap: 12 (ref 5–15)
BUN: 13 mg/dL (ref 7–25)
BUN: 18 mg/dL (ref 8–23)
CO2: 28 mmol/L (ref 20–32)
CO2: 25 mmol/L (ref 22–32)
Calcium: 10.5 mg/dL — ABNORMAL HIGH (ref 8.6–10.4)
Calcium: 9.6 mg/dL (ref 8.9–10.3)
Chloride: 96 mmol/L — ABNORMAL LOW (ref 98–110)
Chloride: 97 mmol/L — ABNORMAL LOW (ref 98–111)
Creat: 0.88 mg/dL (ref 0.60–1.00)
Creatinine, Ser: 1.02 mg/dL — ABNORMAL HIGH (ref 0.44–1.00)
GFR, Estimated: 57 mL/min — ABNORMAL LOW (ref >60)
Globulin: 3.9 g/dL — ABNORMAL HIGH (ref 1.9–3.7)
Glucose, Bld: 117 mg/dL — ABNORMAL HIGH (ref 65–99)
Glucose, Bld: 135 mg/dL — ABNORMAL HIGH (ref 70–99)
Potassium: 4.1 mmol/L (ref 3.5–5.3)
Potassium: 3.4 mmol/L — ABNORMAL LOW (ref 3.5–5.1)
Sodium: 135 mmol/L (ref 135–146)
Sodium: 134 mmol/L — ABNORMAL LOW (ref 135–145)
Total Bilirubin: 3.2 mg/dL — ABNORMAL HIGH (ref 0.2–1.2)
Total Bilirubin: 3 mg/dL — ABNORMAL HIGH (ref 0.0–1.2)
Total Protein: 8.1 g/dL (ref 6.1–8.1)
Total Protein: 7.7 g/dL (ref 6.5–8.1)
eGFR: 68 mL/min/1.73m2 (ref >=60)

## 2023-08-04 LAB — URINALYSIS, MICROSCOPIC ONLY: RBC / HPF: NONE SEEN /HPF (ref 0–2)

## 2023-08-04 LAB — CBC
HCT: 38 % (ref 36.0–46.0)
Hemoglobin: 13 g/dL (ref 12.0–15.0)
MCH: 31.2 pg (ref 26.0–34.0)
MCHC: 34.2 g/dL (ref 30.0–36.0)
MCV: 91.1 fL (ref 80.0–100.0)
Platelets: 324 K/uL (ref 150–400)
RBC: 4.17 MIL/uL (ref 3.87–5.11)
RDW: 13.9 % (ref 11.5–15.5)
WBC: 7.1 K/uL (ref 4.0–10.5)
nRBC: 0 % (ref 0.0–0.2)

## 2023-08-04 LAB — HEPATITIS PANEL, ACUTE
Hep A IgM: NONREACTIVE
Hep B C IgM: NONREACTIVE
Hepatitis B Surface Ag: NONREACTIVE
Hepatitis C Ab: NONREACTIVE

## 2023-08-04 LAB — LIPASE, BLOOD: Lipase: 23 U/L (ref 11–51)

## 2023-08-04 LAB — PROTIME-INR
INR: 1.1 (ref 0.8–1.2)
Prothrombin Time: 15.2 s (ref 11.4–15.2)

## 2023-08-04 LAB — GAMMA GT: GGT: 1797 U/L — ABNORMAL HIGH (ref 3–65)

## 2023-08-04 LAB — APTT: aPTT: 34 s (ref 24–36)

## 2023-08-04 MED ORDER — ONDANSETRON HCL 4 MG/2ML IJ SOLN
4.0000 mg | Freq: Three times a day (TID) | INTRAMUSCULAR | Status: DC | PRN
  Administered 2023-08-08 – 2023-08-09 (×2): 4 mg via INTRAVENOUS
  Filled 2023-08-04 (×2): qty 2

## 2023-08-04 MED ORDER — POTASSIUM CHLORIDE CRYS ER 20 MEQ PO TBCR
20.0000 meq | EXTENDED_RELEASE_TABLET | Freq: Once | ORAL | Status: AC
  Administered 2023-08-04: 20 meq via ORAL
  Filled 2023-08-04: qty 1

## 2023-08-04 MED ORDER — SODIUM CHLORIDE 0.9 % IV SOLN: INTRAVENOUS | Status: AC

## 2023-08-04 MED ORDER — GADOBUTROL 1 MMOL/ML IV SOLN
8.0000 mL | Freq: Once | INTRAVENOUS | Status: AC | PRN
  Administered 2023-08-04: 8 mL via INTRAVENOUS

## 2023-08-04 MED ORDER — OXYCODONE HCL 5 MG PO TABS: 5.0000 mg | ORAL_TABLET | Freq: Four times a day (QID) | ORAL | Status: DC | PRN

## 2023-08-04 MED ORDER — ALBUTEROL SULFATE (2.5 MG/3ML) 0.083% IN NEBU: 2.5000 mg | INHALATION_SOLUTION | RESPIRATORY_TRACT | Status: DC | PRN

## 2023-08-04 MED ORDER — HYDRALAZINE HCL 20 MG/ML IJ SOLN: 5.0000 mg | INTRAMUSCULAR | Status: DC | PRN

## 2023-08-04 MED ORDER — DM-GUAIFENESIN ER 30-600 MG PO TB12: 1.0000 | ORAL_TABLET | Freq: Two times a day (BID) | ORAL | Status: DC | PRN

## 2023-08-04 NOTE — H&P (Signed)
 History and Physical    Colleen [name] FMW:[id number] DOB: 1946/01/10 DOA: 08/04/2023  Referring MD/NP/PA:   PCP: Leavy Mole, PA-C   Patient coming from:  The patient is coming from home.     Chief Complaint: abnormal lab, dark urine and lighter stool  HPI: Colleen [name] is a 78 y.o. female with medical history significant of hypertension, hyperlipidemia, prediabetes, COPD/asthma, gout, GERD, CKD-3, A-fib and PE on Eliquis, lumbar spinal stenosis, who presents with abnormal lab work, dark urine and lighter stool.  Patient states that in the past 2 weeks, she noticed her urine is dark and her stool is lighter in color.  She had a regular visit with PCP on 7/7 who did some lab and found that her liver function was abnormal.  Then right upper quadrant ultrasound was ordered, which showed dilated common bile duct today.  Patient was called  by her PCP and instructed to come to the hospital for further evaluation and treatment.  Patient denies nausea, vomiting, diarrhea or abdominal pain.  No fever or chills.  No chest pain, cough, SOB.  No symptoms of UTI.  Chart review revealed that patient had negative hepatitis panel on 08/03/2023.  RUQ-US : Dilated common bile duct and gallbladder suspicious for stricture, mass, or choledocholith obstructing the flow of bile in the distal common bile duct. Further evaluation with contrast enhanced abdominal MRI/MRCP or ERCP should be performed.   Data reviewed independently and ED Course: pt was found to have WBC 7.1, liver function (ALP 1134, AST 146, ALT 172, total bilirubin 3.0), temperature normal, blood pressure 121/74, heart rate 83, RR 17, oxygen saturation 100% on room air.  Patient is admitted to telemetry bed as inpatient.  Dr. Jinny of GI is consulted by EDP. Also consulted Dr. Jacobo of oncology.  MRCP: 1. 2.8 x 1.8 cm hypoenhancing infiltrating lesion involving the lower pancreatic head accounting for the common bile duct and  main pancreatic ductal dilatation. Findings consistent with pancreatic adenocarcinoma. 2. No obvious involvement of the major vascular structures. 3. No abdominal adenopathy or hepatic lesions to suggest metastatic disease.   EKG: I have personally reviewed.  A-fib, QTc 428, low voltage, LAD, poor R wave progression.   Review of Systems:   General: no fevers, chills, no body weight gain. HEENT: no blurry vision, hearing changes or sore throat Respiratory: no dyspnea, coughing, wheezing CV: no chest pain, no palpitations GI: no nausea, vomiting, abdominal pain, diarrhea, constipation GU: no dysuria, burning on urination, increased urinary frequency, hematuria. Has dark urine Ext: no leg edema Neuro: no unilateral weakness, numbness, or tingling, no vision change or hearing loss Skin: no rash, no skin tear. MSK: No muscle spasm, no deformity, no limitation of range of movement in spin Heme: No easy bruising.  Travel history: No recent long distant travel.   Allergy:  Allergies  Allergen Reactions   Nsaids     Other Reaction(s): Not available    Past Medical History:  Diagnosis Date   Asthma    Cataract    COPD (chronic obstructive pulmonary disease) (HCC)    GERD (gastroesophageal reflux disease)    Gout    History of pulmonary embolus (PE)    Hyperlipidemia    Hypertension    Osteoarthrosis    Prediabetes     Past Surgical History:  Procedure Laterality Date   BACK SURGERY     BREAST BIOPSY Left 04/08/2014   intraductal papilloma 5 mm   COLONOSCOPY WITH PROPOFOL  N/A 04/23/2020  Procedure: COLONOSCOPY WITH PROPOFOL ;  Surgeon: Janalyn Keene NOVAK, MD;  Location: [facility] ENDOSCOPY;  Service: Endoscopy;  Laterality: N/A;   ESOPHAGOGASTRODUODENOSCOPY (EGD) WITH PROPOFOL  N/A 04/23/2020   Procedure: ESOPHAGOGASTRODUODENOSCOPY (EGD) WITH PROPOFOL ;  Surgeon: Janalyn Keene NOVAK, MD;  Location: [facility] ENDOSCOPY;  Service: Endoscopy;  Laterality: N/A;   GIVENS CAPSULE STUDY  N/A 01/28/2022   Procedure: GIVENS CAPSULE STUDY;  Surgeon: Therisa Bi, MD;  Location: [facility] ENDOSCOPY;  Service: Gastroenterology;  Laterality: N/A;   LUMBAR LAMINECTOMY     TONSILLECTOMY      Social History:  reports that she quit smoking about 14 years ago. Her smoking use included cigarettes. She started smoking about 34 years ago. She has a 30 pack-year smoking history. She has never used smokeless tobacco. She reports that she does not currently use alcohol. She reports that she does not use drugs.  Family History:  Family History  Problem Relation Age of Onset   Diabetes Mother    Lung cancer Father    Diabetes Sister    Congestive Heart Failure Sister    Glaucoma Sister    Breast cancer Maternal Aunt      Prior to Admission medications   Medication Sig Start Date End Date Taking? Authorizing Provider  acetaminophen  (TYLENOL ) 650 MG CR tablet Take 650 mg by mouth every 8 (eight) hours as needed for pain. Patient not taking: Reported on 08/03/2023    [provider]  albuterol  (VENTOLIN  HFA) 108 (90 Base) MCG/ACT inhaler Inhale 2 puffs into the lungs every 4 (four) hours as needed for wheezing or shortness of breath. 03/31/23   Tapia, Leisa, PA-C  allopurinol  (ZYLOPRIM ) 100 MG tablet Take 2 tablets (200 mg total) by mouth daily. 08/01/23   Tapia, Leisa, PA-C  Blood Pressure Monitoring (ADULT BLOOD PRESSURE CUFF LG) KIT 1 each by Does not apply route daily. 09/01/21   Bernardo Fend, DO  Budeson-Glycopyrrol-Formoterol  (BREZTRI  AEROSPHERE) 160-9-4.8 MCG/ACT AERO Inhale 2 puffs into the lungs 2 (two) times daily. 04/19/22   Tapia, Leisa, PA-C  cholecalciferol (VITAMIN D3) 25 MCG (1000 UNIT) tablet Take 1,000 Units by mouth daily.    [provider]  ELIQUIS 5 MG TABS tablet Take 5 mg by mouth 2 (two) times daily. 04/21/19   [provider]  enalapril  (VASOTEC ) 2.5 MG tablet Take 1 tablet (2.5 mg total) by mouth daily. 10/05/22   Tapia, Leisa, PA-C  famotidine   (PEPCID ) 20 MG tablet Take 20 mg by mouth daily as needed for heartburn or indigestion.    [provider]  hydrochlorothiazide  (MICROZIDE ) 12.5 MG capsule Take 1 capsule (12.5 mg total) by mouth daily. 08/01/23   Tapia, Leisa, PA-C  lovastatin  (MEVACOR ) 40 MG tablet Take 1 tablet (40 mg total) by mouth at bedtime. 03/31/23   Tapia, Leisa, PA-C  pantoprazole  (PROTONIX ) 40 MG tablet Take 1 tablet (40 mg total) by mouth daily. 08/01/23   Tapia, Leisa, PA-C  simethicone  (GAS-X) 80 MG chewable tablet Chew 1 tablet (80 mg total) by mouth 2 (two) times a day. 08/02/18 06/17/28  Dorothyann Drivers, MD  triamcinolone  cream (KENALOG ) 0.1 % APPLY TO AFFECTED AREA TWICE DAILY IF NEEDED. NO MORE THAN 1 WEEK AT A TIME, THEN TAKE A BREAK FOR 2 WEEKS. 02/10/22   Bernardo Fend, DO    Physical Exam: Vitals:   08/04/23 1606 08/04/23 1803 08/04/23 2015 08/04/23 2115  BP:  121/74 (!) 115/59 129/71  Pulse:  78 62 82  Resp:  14 16 18   Temp:  SpO2:  100% 100% 100%  Weight: 83 kg     Height: 5' 4 (1.626 m)      General: Not in acute distress HEENT:       Eyes: PERRL, EOMI,        ENT: No discharge from the ears and nose, no pharynx injection, no tonsillar enlargement.        Neck: No JVD, no bruit, no mass felt. Heme: No neck lymph node enlargement. Cardiac: S1/S2, RRR, No murmurs, No gallops or rubs. Respiratory: No rales, wheezing, rhonchi or rubs. GI: Soft, nondistended, nontender, no rebound pain, no organomegaly, BS present. GU: No hematuria Ext: No pitting leg edema bilaterally. 1+DP/PT pulse bilaterally. Musculoskeletal: No joint deformities, No joint redness or warmth, no limitation of ROM in spin. Skin: No rashes.  Neuro: Alert, oriented X3, cranial nerves II-XII grossly intact, moves all extremities normally.  Psych: Patient is not psychotic, no suicidal or hemocidal ideation.  Labs on Admission: I have personally reviewed following labs and imaging studies  CBC: Recent Labs  Lab  08/01/23 1045 08/04/23 1608  WBC 6.1 7.1  NEUTROABS 2,928  --   HGB 13.4 13.0  HCT 40.2 38.0  MCV 93.5 91.1  PLT 343 324   Basic Metabolic Panel: Recent Labs  Lab 08/01/23 1045 08/03/23 1203 08/04/23 1608  NA 133* 135 134*  K 4.0 4.1 3.4*  CL 95* 96* 97*  CO2 27 28 25   GLUCOSE 105* 117* 135*  BUN 13 13 18   CREATININE 0.77 0.88 1.02*  CALCIUM  10.0 10.5* 9.6  MG  --   --  2.0   GFR: Estimated Creatinine Clearance: 48.1 mL/min (A) (by C-G formula based on SCr of 1.02 mg/dL (H)). Liver Function Tests: Recent Labs  Lab 08/01/23 1045 08/03/23 1203 08/04/23 1608  AST 268* 184* 146*  ALT 279* 223* 172*  ALKPHOS  --   --  1,134*  BILITOT 3.5* 3.2* 3.0*  PROT 7.6 8.1 7.7  ALBUMIN  --   --  3.7   Recent Labs  Lab 08/01/23 1045 08/04/23 1608  LIPASE 5* 23   No results for input(s): AMMONIA in the last 168 hours. Coagulation Profile: Recent Labs  Lab 08/04/23 2051  INR 1.1   Cardiac Enzymes: No results for input(s): CKTOTAL, CKMB, CKMBINDEX, TROPONINI in the last 168 hours. BNP (last 3 results) No results for input(s): PROBNP in the last 8760 hours. HbA1C: No results for input(s): HGBA1C in the last 72 hours. CBG: No results for input(s): GLUCAP in the last 168 hours. Lipid Profile: No results for input(s): CHOL, HDL, LDLCALC, TRIG, CHOLHDL, LDLDIRECT in the last 72 hours. Thyroid  Function Tests: No results for input(s): TSH, T4TOTAL, FREET4, T3FREE, THYROIDAB in the last 72 hours. Anemia Panel: No results for input(s): VITAMINB12, FOLATE, FERRITIN, TIBC, IRON, RETICCTPCT in the last 72 hours. Urine analysis:    Component Value Date/Time   COLORURINE DARK YELLOW 05/14/2022 1113   APPEARANCEUR CLOUDY (A) 05/14/2022 1113   LABSPEC 1.023 05/14/2022 1113   PHURINE 6.0 05/14/2022 1113   GLUCOSEU NEGATIVE 05/14/2022 1113   HGBUR NEGATIVE 05/14/2022 1113   BILIRUBINUR negative 07/26/2022 1046   KETONESUR  NEGATIVE 05/14/2022 1113   PROTEINUR Positive (A) 07/26/2022 1046   PROTEINUR TRACE (A) 05/14/2022 1113   UROBILINOGEN 0.2 07/26/2022 1046   NITRITE small 07/26/2022 1046   NITRITE POSITIVE (A) 05/14/2022 1113   LEUKOCYTESUR Trace (A) 07/26/2022 1046   LEUKOCYTESUR 2+ (A) 05/14/2022 1113   Sepsis Labs: @LABRCNTIP (procalcitonin:4,lacticidven:4) )No results  found for this or any previous visit (from the past 240 hours).   Radiological Exams on Admission:   Assessment/Plan Principal Problem:   Pancreatic mass Active Problems:   Abnormal LFTs   Benign essential HTN   HLD (hyperlipidemia)   COPD (chronic obstructive pulmonary disease) (HCC)   Gout   Prediabetes   Hypokalemia   Chronic kidney disease, stage 3a (HCC)   Atrial fibrillation, chronic (HCC)   Pulmonary embolism (HCC)   Obesity (BMI 30-39.9)   Assessment and Plan:  Pancreatic mass: MRCP showed pancreatic lesion causing common bile duct and pancreatic ductal dilation, consistent with pancreatic adenocarcinoma, no signs of metastatic disease yet.  Per ED physician, Dr. Jinny of GI is consulted. I consulted  Dr. Jacobo of oncology.  -will admit to telemetry bed as inpatient - Check CA-19-9 - Check INR/PTT - switch Eliquis to IV heparin temporarily - Follow-up with GI and oncologist recommendations.  Abnormal LFTs: Secondary to pancreatic mass compression effects. - Hold lovastatin  temporarily - Judicious use low-dose prn Tylenol  325 mg (patient is allergic to NSAID)  Benign essential HTN -Continue enalapril   - Hold HCTZ temporarily while patient is on n.p.o. - IV hydralazine  as needed  HLD (hyperlipidemia) -Hold lovastatin  due to abnormal liver function  COPD (chronic obstructive pulmonary disease) (HCC): Stable -Bronchodilators and as needed Mucinex   Gout -Allopurinol   Prediabetes: Recent A1c 5.7, well-controlled.  Patient not taking medications currently. -Check CBG every morning  Hypokalemia:  Potassium 3.4, magnesium 2.0 -Repleted potassium  Chronic kidney disease, stage 3a (HCC): Renal function is close to baseline.  Recent baseline creatinine 0.8-1.0, her creatinine is 1.02, BUN 18, GFR 57 -Follow-up with BMP  Atrial fibrillation, chronic (HCC): Heart rate 80s - Switch Eliquis to IV heparin temporarily for possible procedure  Pulmonary embolism (HCC) -IV heparin now  Obesity (BMI 30-39.9): Patient has Obesity Class  I, with body weight 83 Kg and BMI 31.41  kg/m2.  - Encourage losing weight - Exercise and healthy diet       DVT ppx: on IV Heparin     Code Status: Full code   Family Communication:     not done, no family member is at bed side.      Disposition Plan:  Anticipate discharge back to previous environment  Consults called:  Dr. Jinny of GI is consulted by EDP. Also consulted Dr. Jacobo of oncology  Admission status and Level of care: Telemetry Medical:   as inpt        Dispo: The patient is from: Home              Anticipated d/c is to: Home              Anticipated d/c date is: 2 days              Patient currently is not medically stable to d/c.    Severity of Illness:  The appropriate patient status for this patient is INPATIENT. Inpatient status is judged to be reasonable and necessary in order to provide the required intensity of service to ensure the patient's safety. The patient's presenting symptoms, physical exam findings, and initial radiographic and laboratory data in the context of their chronic comorbidities is felt to place them at high risk for further clinical deterioration. Furthermore, it is not anticipated that the patient will be medically stable for discharge from the hospital within 2 midnights of admission.   * I certify that at the point of admission it is my clinical  judgment that the patient will require inpatient hospital care spanning beyond 2 midnights from the point of admission due to high intensity of service, high  risk for further deterioration and high frequency of surveillance required.*       Date of Service 08/05/2023    Caleb Exon [facility]   If 7PM-7AM, please contact night-coverage [email or web address] 08/05/2023, 12:12 AM

## 2023-08-04 NOTE — Telephone Encounter (Signed)
 Colleen [name] spoke with the GI specialists and after reviewing abnormal imaging, weight loss, and abnormal lab results they recommend her going to ER to do the next step or procedures that are needed. I called the patient to notify her and she gave verbal understanding. Per Colleen [name], Dr. Unk, Dr. Jinny, and Dr. Therisa are all aware. They will do her MRI inpatient.

## 2023-08-04 NOTE — ED Triage Notes (Signed)
 Pt to ER from home states she was suppose to have an MRI of her abdomen tomorrow to better evaluate her gallbladder.  Had an US  today and labs on Wednesday.  Was called later today by her doctor and told to come to hospital to get admitted due to labs being off.  Pt denies current pain, n/v or other c/o.

## 2023-08-04 NOTE — ED Notes (Signed)
 Patient to MRI via wheelchair.

## 2023-08-04 NOTE — ED Provider Notes (Signed)
 [facility] Provider Note    Event Date/Time   First MD Initiated Contact with Patient 08/04/23 1733     (approximate)   History   abnormal labs  Pt to ER from home states she was suppose to have an MRI of her abdomen tomorrow to better evaluate her gallbladder.  Had an US  today and labs on Wednesday.  Was called later today by her doctor and told to come to hospital to get admitted due to labs being off.  Pt denies current pain, n/v or other c/o.   HPI Colleen [name] is a 78 y.o. female PMH A-fib on Eliquis, COPD, prior gastroplasty, CKD, hyperlipidemia, hypertension, iron deficiency anemia, prior pulmonary embolus presents for evaluation of abnormal liver function tests - Patient presents due to abnormal tests.  Has otherwise been feeling well and in her usual state of health.  No fever, nausea, vomiting, diarrhea.  Last bowel movement today, unremarkable.  Has noticed light stool over the past several days but otherwise has been feeling well.       Physical Exam   Triage Vital Signs: ED Triage Vitals  Encounter Vitals Group     BP 08/04/23 1605 99/73     Girls Systolic BP Percentile --      Girls Diastolic BP Percentile --      Boys Systolic BP Percentile --      Boys Diastolic BP Percentile --      Pulse Rate 08/04/23 1605 83     Resp 08/04/23 1605 16     Temp 08/04/23 1605 98 F (36.7 C)     Temp src --      SpO2 08/04/23 1605 96 %     Weight 08/04/23 1606 183 lb (83 kg)     Height 08/04/23 1606 5' 4 (1.626 m)     Head Circumference --      Peak Flow --      Pain Score 08/04/23 1605 0     Pain Loc --      Pain Education --      Exclude from Growth Chart --     Most recent vital signs: Vitals:   08/04/23 1605 08/04/23 1803  BP: 99/73 121/74  Pulse: 83 78  Resp: 16 14  Temp: 98 F (36.7 C)   SpO2: 96% 100%     General: Awake, no distress.  HEENT: Mild scleral jaundice CV:  Good peripheral perfusion. RRR, RP  2+ Resp:  Normal effort. CTAB Abd:  No distention. Nontender to deep palpation throughout    ED Results / Procedures / Treatments   Labs (all labs ordered are listed, but only abnormal results are displayed) Labs Reviewed  COMPREHENSIVE METABOLIC PANEL WITH GFR - Abnormal; Notable for the following components:      Result Value   Sodium 134 (*)    Potassium 3.4 (*)    Chloride 97 (*)    Glucose, Bld 135 (*)    Creatinine, Ser 1.02 (*)    AST 146 (*)    ALT 172 (*)    Alkaline Phosphatase 1,134 (*)    Total Bilirubin 3.0 (*)    GFR, Estimated 57 (*)    All other components within normal limits  CBC  LIPASE, BLOOD     EKG  N/a   RADIOLOGY MRI pending.    PROCEDURES:  Critical Care performed: No  Procedures   MEDICATIONS ORDERED IN ED: Medications - No data to display   IMPRESSION /  MDM / ASSESSMENT AND PLAN / ED COURSE  I reviewed the triage vital signs and the nursing notes.                              DDX/MDM/AP: Differential diagnosis includes, but is not limited to, choledocholithiasis, CBD stricture, consider obstructing mass/malignancy.  She is asymptomatic and very well-appearing here, no fever, doubt cholangitis.  Plan: - Repeat labs - MRCP - anticipate admission  Patient's presentation is most consistent with acute presentation with potential threat to life or bodily function.    ED course below.  Repeat labs show persistent transaminitis with elevated bilirubin.  Discussed with on-call GI who notes patient will need further evaluation by their team regardless of MRCP results.  MRCP ordered, pending.  Plan for admission to hospitalist service.     FINAL CLINICAL IMPRESSION(S) / ED DIAGNOSES   Final diagnoses:  Transaminitis  Abnormal abdominal ultrasound     Rx / DC Orders   ED Discharge Orders     None        Note:  This document was prepared using Dragon voice recognition software and may include unintentional  dictation errors.   Clarine Ozell LABOR, MD 08/04/23 2011

## 2023-08-04 NOTE — ED Provider Notes (Signed)
-----------------------------------------   8:30 PM on 08/04/2023 -----------------------------------------  Blood pressure (!) 115/59, pulse 62, temperature 98 F (36.7 C), resp. rate 16, height 5' 4 (1.626 m), weight 83 kg, SpO2 100%.  Assuming care from Dr. Clarine.  In short, Colleen [name] is a 78 y.o. female with a chief complaint of abnormal labs .  Refer to the original H&P for additional details.  The current plan of care is to consult w/ hospitalist team for admission.  ----------------------------------------- 8:31 PM on 08/04/2023 -----------------------------------------  Patient accepted as admission under hospitalist team.  MRCP pending.  Patient remains asymptomatic.    Clinical Course as of 08/04/23 2030  Thu Aug 04, 2023  2007 ERCP tomorrow with dr jinny. No abx asymptomatic  [MH]    Clinical Course User Index [MH] Margrette Rebbeca DELENA DEVONNA Margrette, Niti Leisure A, PA-C 08/04/23 2032    Clarine Ozell DELENA, MD 08/09/23 2322

## 2023-08-05 ENCOUNTER — Ambulatory Visit: Admission: RE | Admit: 2023-08-05 | Source: Ambulatory Visit

## 2023-08-05 ENCOUNTER — Inpatient Hospital Stay: Admission: RE | Admit: 2023-08-05 | Source: Ambulatory Visit

## 2023-08-05 ENCOUNTER — Other Ambulatory Visit: Payer: Self-pay

## 2023-08-05 ENCOUNTER — Encounter
Admission: EM | Disposition: A | Discharge status: Home / Self Care | Payer: Self-pay | Source: Home / Self Care | Attending: Internal Medicine

## 2023-08-05 ENCOUNTER — Inpatient Hospital Stay: Admitting: Anesthesiology

## 2023-08-05 ENCOUNTER — Encounter: Payer: Self-pay | Admitting: Internal Medicine

## 2023-08-05 ENCOUNTER — Inpatient Hospital Stay

## 2023-08-05 DIAGNOSIS — K831 Obstruction of bile duct: Secondary | ICD-10-CM | POA: Diagnosis not present

## 2023-08-05 DIAGNOSIS — K8689 Other specified diseases of pancreas: Secondary | ICD-10-CM | POA: Diagnosis not present

## 2023-08-05 DIAGNOSIS — R7989 Other specified abnormal findings of blood chemistry: Secondary | ICD-10-CM | POA: Diagnosis not present

## 2023-08-05 HISTORY — PX: ERCP: SHX5425

## 2023-08-05 LAB — CBC
HCT: 39 % (ref 36.0–46.0)
Hemoglobin: 12.8 g/dL (ref 12.0–15.0)
MCH: 30.3 pg (ref 26.0–34.0)
MCHC: 32.8 g/dL (ref 30.0–36.0)
MCV: 92.4 fL (ref 80.0–100.0)
Platelets: 314 K/uL (ref 150–400)
RBC: 4.22 MIL/uL (ref 3.87–5.11)
RDW: 13.9 % (ref 11.5–15.5)
WBC: 6.8 K/uL (ref 4.0–10.5)
nRBC: 0 % (ref 0.0–0.2)

## 2023-08-05 LAB — COMPREHENSIVE METABOLIC PANEL WITH GFR
ALT: 161 U/L — ABNORMAL HIGH (ref 0–44)
AST: 138 U/L — ABNORMAL HIGH (ref 15–41)
Albumin: 3.6 g/dL (ref 3.5–5.0)
Alkaline Phosphatase: 1003 U/L — ABNORMAL HIGH (ref 38–126)
Anion gap: 9 (ref 5–15)
BUN: 15 mg/dL (ref 8–23)
CO2: 28 mmol/L (ref 22–32)
Calcium: 9.4 mg/dL (ref 8.9–10.3)
Chloride: 100 mmol/L (ref 98–111)
Creatinine, Ser: 0.33 mg/dL — ABNORMAL LOW (ref 0.44–1.00)
GFR, Estimated: >60 mL/min (ref >60)
Glucose, Bld: 116 mg/dL — ABNORMAL HIGH (ref 70–99)
Potassium: 3.3 mmol/L — ABNORMAL LOW (ref 3.5–5.1)
Sodium: 137 mmol/L (ref 135–145)
Total Bilirubin: 2.8 mg/dL — ABNORMAL HIGH (ref 0.0–1.2)
Total Protein: 7.7 g/dL (ref 6.5–8.1)

## 2023-08-05 LAB — CBG MONITORING, ED: Glucose-Capillary: 90 mg/dL (ref 70–99)

## 2023-08-05 SURGERY — ERCP, WITH INTERVENTION IF INDICATED
Anesthesia: General

## 2023-08-05 MED ORDER — POTASSIUM CHLORIDE 10 MEQ/100ML IV SOLN
10.0000 meq | INTRAVENOUS | Status: AC
  Administered 2023-08-05 (×3): 10 meq via INTRAVENOUS
  Filled 2023-08-05 (×2): qty 100

## 2023-08-05 MED ORDER — LIDOCAINE HCL (CARDIAC) PF 100 MG/5ML IV SOSY
PREFILLED_SYRINGE | INTRAVENOUS | Status: DC | PRN
Start: 2023-08-05 — End: 2023-08-05
  Administered 2023-08-05: 60 mg via INTRAVENOUS

## 2023-08-05 MED ORDER — PROPOFOL 500 MG/50ML IV EMUL
INTRAVENOUS | Status: DC | PRN
  Administered 2023-08-05: 50 ug/kg/min via INTRAVENOUS

## 2023-08-05 MED ORDER — LACTATED RINGERS IV SOLN: INTRAVENOUS | Status: DC

## 2023-08-05 MED ORDER — BUDESON-GLYCOPYRROL-FORMOTEROL 160-9-4.8 MCG/ACT IN AERO
2.0000 | INHALATION_SPRAY | Freq: Two times a day (BID) | RESPIRATORY_TRACT | Status: DC
  Administered 2023-08-05 – 2023-08-09 (×8): 2 via RESPIRATORY_TRACT
  Filled 2023-08-05: qty 5.9

## 2023-08-05 MED ORDER — PANTOPRAZOLE SODIUM 40 MG PO TBEC
40.0000 mg | DELAYED_RELEASE_TABLET | Freq: Every day | ORAL | Status: DC
  Administered 2023-08-05 – 2023-08-09 (×4): 40 mg via ORAL
  Filled 2023-08-05 (×5): qty 1

## 2023-08-05 MED ORDER — ENALAPRIL MALEATE 2.5 MG PO TABS
2.5000 mg | ORAL_TABLET | Freq: Every day | ORAL | Status: DC
  Administered 2023-08-05 – 2023-08-09 (×4): 2.5 mg via ORAL
  Filled 2023-08-05 (×5): qty 1

## 2023-08-05 MED ORDER — DEXMEDETOMIDINE HCL IN NACL 80 MCG/20ML IV SOLN
INTRAVENOUS | Status: DC | PRN
  Administered 2023-08-05: 12 ug via INTRAVENOUS
  Administered 2023-08-05: 8 ug via INTRAVENOUS

## 2023-08-05 MED ORDER — DICLOFENAC SUPPOSITORY 100 MG
RECTAL | Status: AC
  Filled 2023-08-05: qty 1

## 2023-08-05 MED ORDER — ESMOLOL HCL 100 MG/10ML IV SOLN
INTRAVENOUS | Status: DC | PRN
Start: 2023-08-05 — End: 2023-08-05
  Administered 2023-08-05 (×2): 10 mg via INTRAVENOUS

## 2023-08-05 MED ORDER — ACETAMINOPHEN 325 MG PO TABS: 325.0000 mg | ORAL_TABLET | Freq: Four times a day (QID) | ORAL | Status: DC | PRN

## 2023-08-05 MED ORDER — POTASSIUM CHLORIDE 10 MEQ/100ML IV SOLN
10.0000 meq | INTRAVENOUS | Status: AC
  Filled 2023-08-05: qty 100

## 2023-08-05 MED ORDER — ALLOPURINOL 100 MG PO TABS
200.0000 mg | ORAL_TABLET | Freq: Every day | ORAL | Status: DC
  Administered 2023-08-05 – 2023-08-09 (×4): 200 mg via ORAL
  Filled 2023-08-05 (×5): qty 2

## 2023-08-05 MED ORDER — POTASSIUM CHLORIDE 10 MEQ/100ML IV SOLN
10.0000 meq | Freq: Once | INTRAVENOUS | Status: AC
  Administered 2023-08-05: 10 meq via INTRAVENOUS

## 2023-08-05 MED ORDER — PROPOFOL 10 MG/ML IV BOLUS
INTRAVENOUS | Status: DC | PRN
  Administered 2023-08-05 (×2): 50 mg via INTRAVENOUS

## 2023-08-05 MED ORDER — SODIUM CHLORIDE 0.9 % IV SOLN
2.0000 g | Freq: Once | INTRAVENOUS | Status: AC
  Administered 2023-08-08: 2 g via INTRAVENOUS
  Filled 2023-08-05 (×2): qty 2

## 2023-08-05 MED ORDER — DICLOFENAC SUPPOSITORY 100 MG
100.0000 mg | Freq: Once | RECTAL | Status: AC
  Administered 2023-08-05: 100 mg via RECTAL

## 2023-08-05 NOTE — Plan of Care (Signed)

## 2023-08-05 NOTE — Anesthesia Preprocedure Evaluation (Signed)
 Anesthesia Evaluation  Patient identified by MRN, date of birth, ID band Patient awake    Reviewed: Allergy & Precautions, NPO status , Patient's Chart, lab work & pertinent test results  Airway Mallampati: III  TM Distance: >3 FB Neck ROM: full    Dental  (+) Partial Upper, Missing, Poor Dentition, Chipped, Dental Advisory Given   Pulmonary neg pulmonary ROS, asthma , COPD, former smoker   Pulmonary exam normal  + decreased breath sounds      Cardiovascular Exercise Tolerance: Poor hypertension, Pt. on medications + DOE  negative cardio ROS Normal cardiovascular exam Rhythm:Regular     Neuro/Psych  Neuromuscular disease negative neurological ROS  negative psych ROS   GI/Hepatic negative GI ROS, Neg liver ROS, hiatal hernia,GERD  Medicated,,  Endo/Other  negative endocrine ROSdiabetes, Type 2    Renal/GU Renal diseasenegative Renal ROS  negative genitourinary   Musculoskeletal  (+) Arthritis ,    Abdominal  (+) + obese  Peds  Hematology negative hematology ROS (+) Blood dyscrasia   Anesthesia Other Findings Past Medical History: No date: Asthma No date: Cataract No date: COPD (chronic obstructive pulmonary disease) (HCC) No date: GERD (gastroesophageal reflux disease) No date: Gout No date: History of pulmonary embolus (PE) No date: Hyperlipidemia No date: Hypertension No date: Osteoarthrosis No date: Prediabetes  Past Surgical History: No date: BACK SURGERY 04/08/2014: BREAST BIOPSY; Left     Comment:  intraductal papilloma 5 mm 04/23/2020: COLONOSCOPY WITH PROPOFOL ; N/A     Comment:  Procedure: COLONOSCOPY WITH PROPOFOL ;  Surgeon:               Janalyn Keene NOVAK, MD;  Location: [facility] ENDOSCOPY;                Service: Endoscopy;  Laterality: N/A; 04/23/2020: ESOPHAGOGASTRODUODENOSCOPY (EGD) WITH PROPOFOL ; N/A     Comment:  Procedure: ESOPHAGOGASTRODUODENOSCOPY (EGD) WITH               PROPOFOL ;   Surgeon: Janalyn Keene NOVAK, MD;  Location:               [facility] ENDOSCOPY;  Service: Endoscopy;  Laterality: N/A; 01/28/2022: GIVENS CAPSULE STUDY; N/A     Comment:  Procedure: GIVENS CAPSULE STUDY;  Surgeon: Therisa Bi,               MD;  Location: [facility] ENDOSCOPY;  Service:               Gastroenterology;  Laterality: N/A; No date: LUMBAR LAMINECTOMY No date: TONSILLECTOMY  BMI    Body Mass Index: 31.41 kg/m      Reproductive/Obstetrics negative OB ROS                              Anesthesia Physical Anesthesia Plan  ASA: 3  Anesthesia Plan: General   Post-op Pain Management:    Induction: Intravenous  PONV Risk Score and Plan: Propofol  infusion and TIVA  Airway Management Planned: Natural Airway and Nasal Cannula  Additional Equipment:   Intra-op Plan:   Post-operative Plan:   Informed Consent: I have reviewed the patients History and Physical, chart, labs and discussed the procedure including the risks, benefits and alternatives for the proposed anesthesia with the patient or authorized representative who has indicated his/her understanding and acceptance.     Dental Advisory Given  Plan Discussed with: CRNA  Anesthesia Plan Comments:         Anesthesia Quick Evaluation

## 2023-08-05 NOTE — Anesthesia Postprocedure Evaluation (Signed)
 Anesthesia Post Note  Patient: Colleen [name]  Procedure(s) Performed: ERCP, WITH INTERVENTION IF INDICATED  Patient location during evaluation: PACU Anesthesia Type: General Level of consciousness: awake Pain management: pain level controlled Vital Signs Assessment: post-procedure vital signs reviewed and stable Respiratory status: spontaneous breathing Cardiovascular status: stable Anesthetic complications: no   No notable events documented.   Last Vitals:  Vitals:   08/05/23 1235 08/05/23 1240  BP: (!) 113/55   Pulse: (!) 54 (!) 57  Resp: 18 15  Temp:    SpO2: 98% 98%    Last Pain:  Vitals:   08/05/23 1145  TempSrc: Temporal  PainSc: 0-No pain                 VAN STAVEREN,Kastin Cerda

## 2023-08-05 NOTE — Consult Note (Signed)
 [facility]  Telephone:(336) [phone] Fax:(336) [phone]  ID: Colleen [name] OB: Jan 08, 1946  MR#: [id number]  CSN#:[id number]  Patient Care Team: Leavy Mole, PA-C as PCP - General (Family Medicine) Darliss Rogue, MD as PCP - Cardiology (Cardiology) Dave Agent, MD as Consulting Physician (Rheumatology) Emilio Garrie BRAVO, RN (Inactive) as Registered Nurse Ceola Tereasa PARAS, MD as Referring Physician (Oncology) Therisa Bi, MD as Consulting Physician (Gastroenterology)  CHIEF COMPLAINT: Pancreatic mass.  INTERVAL HISTORY: Patient is a 78 year old female noticing decreasing appetite over the past several weeks.  Subsequent evaluation by her primary care revealed an abnormal metabolic panel and she was sent to the emergency room for further evaluation.  Subsequent imaging revealed a pancreatic head mass highly suspicious for underlying malignancy.  She otherwise feels well.  She has no neurologic complaints.  She denies any recent fevers or illnesses.  She has good appetite and denies weight loss.  She has no chest pain, shortness of breath, cough, or hemoptysis.  She has no abdominal pain.  She denies any nausea, vomiting, constipation, or diarrhea.  She has no melena or hematochezia.  She has no urinary complaints.  Patient offers no further specific complaints today.  REVIEW OF SYSTEMS:   Review of Systems  Constitutional: Negative.  Negative for fever, malaise/fatigue and weight loss.  Respiratory: Negative.  Negative for cough, hemoptysis and shortness of breath.   Cardiovascular: Negative.  Negative for chest pain and leg swelling.  Gastrointestinal: Negative.  Negative for abdominal pain, blood in stool, constipation, diarrhea, melena, nausea and vomiting.  Genitourinary: Negative.  Negative for dysuria.  Musculoskeletal: Negative.  Negative for back pain.  Skin: Negative.  Negative for rash.  Neurological: Negative.  Negative for dizziness, focal weakness,  weakness and headaches.  Psychiatric/Behavioral: Negative.  The patient is not nervous/anxious.     As per HPI. Otherwise, a complete review of systems is negative.  PAST MEDICAL HISTORY: Past Medical History:  Diagnosis Date   Asthma    Cataract    COPD (chronic obstructive pulmonary disease) (HCC)    GERD (gastroesophageal reflux disease)    Gout    History of pulmonary embolus (PE)    Hyperlipidemia    Hypertension    Osteoarthrosis    Prediabetes     PAST SURGICAL HISTORY: Past Surgical History:  Procedure Laterality Date   BACK SURGERY     BREAST BIOPSY Left 04/08/2014   intraductal papilloma 5 mm   COLONOSCOPY WITH PROPOFOL  N/A 04/23/2020   Procedure: COLONOSCOPY WITH PROPOFOL ;  Surgeon: Janalyn Keene NOVAK, MD;  Location: [facility] ENDOSCOPY;  Service: Endoscopy;  Laterality: N/A;   ESOPHAGOGASTRODUODENOSCOPY (EGD) WITH PROPOFOL  N/A 04/23/2020   Procedure: ESOPHAGOGASTRODUODENOSCOPY (EGD) WITH PROPOFOL ;  Surgeon: Janalyn Keene NOVAK, MD;  Location: [facility] ENDOSCOPY;  Service: Endoscopy;  Laterality: N/A;   GIVENS CAPSULE STUDY N/A 01/28/2022   Procedure: GIVENS CAPSULE STUDY;  Surgeon: Therisa Bi, MD;  Location: [facility] ENDOSCOPY;  Service: Gastroenterology;  Laterality: N/A;   LUMBAR LAMINECTOMY     TONSILLECTOMY      FAMILY HISTORY: Family History  Problem Relation Age of Onset   Diabetes Mother    Lung cancer Father    Diabetes Sister    Congestive Heart Failure Sister    Glaucoma Sister    Breast cancer Maternal Aunt     ADVANCED DIRECTIVES (Y/N):  @ADVDIR @  HEALTH MAINTENANCE: Social History   Tobacco Use   Smoking status: Former    Current packs/day: 0.00    Average packs/day: 1.5 packs/day  for 20.0 years (30.0 ttl pk-yrs)    Types: Cigarettes    Start date: 11/13/1988    Quit date: 11/13/2008    Years since quitting: 14.7   Smokeless tobacco: Never   Tobacco comments:    smoking cessation materials not required  Vaping Use   Vaping status: Never  Used  Substance Use Topics   Alcohol use: Not Currently   Drug use: No     Colonoscopy:  PAP:  Bone density:  Lipid panel:  Allergies  Allergen Reactions   Nsaids     Other Reaction(s): Not available    Current Facility-Administered Medications  Medication Dose Route Frequency Provider Last Rate Last Admin   0.9 %  sodium chloride  infusion   Intravenous Continuous Niu, Xilin, MD 75 mL/hr at 08/05/23 1027 Continued from Pre-op at 08/05/23 1027   [MAR Hold] acetaminophen  (TYLENOL ) tablet 325 mg  325 mg Oral Q6H PRN Niu, Xilin, MD       [MAR Hold] albuterol  (PROVENTIL ) (2.5 MG/3ML) 0.083% nebulizer solution 2.5 mg  2.5 mg Inhalation Q4H PRN Niu, Xilin, MD       [MAR Hold] dextromethorphan-guaiFENesin  (MUCINEX  DM) 30-600 MG per 12 hr tablet 1 tablet  1 tablet Oral BID PRN Niu, Xilin, MD       diclofenac  suppository 100 mg  100 mg Rectal Once Wohl, Darren, MD       [MAR Hold] hydrALAZINE  (APRESOLINE ) injection 5 mg  5 mg Intravenous Q2H PRN Niu, Xilin, MD       lactated ringers  infusion   Intravenous Continuous Jinny Carmine, MD 40 mL/hr at 08/05/23 1005 New Bag at 08/05/23 1005   [MAR Hold] ondansetron  (ZOFRAN ) injection 4 mg  4 mg Intravenous Q8H PRN Niu, Xilin, MD       [MAR Hold] oxyCODONE  (Oxy IR/ROXICODONE ) immediate release tablet 5 mg  5 mg Oral Q6H PRN Niu, Xilin, MD        OBJECTIVE: Vitals:   08/05/23 0850 08/05/23 0900  BP:  (!) 140/49  Pulse:  75  Resp:  17  Temp: (!) 94.6 F (34.8 C) 98 F (36.7 C)  SpO2:  100%     Body mass index is 31.41 kg/m.    ECOG FS:0 - Asymptomatic  General: Well-developed, well-nourished, no acute distress. Eyes: Pink conjunctiva, anicteric sclera. HEENT: Normocephalic, moist mucous membranes. Lungs: No audible wheezing or coughing. Heart: Regular rate and rhythm. Abdomen: Soft, nontender, no obvious distention. Musculoskeletal: No edema, cyanosis, or clubbing. Neuro: Alert, answering all questions appropriately. Cranial nerves  grossly intact. Skin: No rashes or petechiae noted. Psych: Normal affect. Lymphatics: No cervical, calvicular, axillary or inguinal LAD.   LAB RESULTS:  Lab Results  Component Value Date   NA 137 08/05/2023   K 3.3 (L) 08/05/2023   CL 100 08/05/2023   CO2 28 08/05/2023   GLUCOSE 116 (H) 08/05/2023   BUN 15 08/05/2023   CREATININE 0.33 (L) 08/05/2023   CALCIUM  9.4 08/05/2023   PROT 7.7 08/05/2023   ALBUMIN 3.6 08/05/2023   AST 138 (H) 08/05/2023   ALT 161 (H) 08/05/2023   ALKPHOS 1,003 (H) 08/05/2023   BILITOT 2.8 (H) 08/05/2023   GFRNONAA >60 08/05/2023   GFRAA 58 (L) 02/28/2020    Lab Results  Component Value Date   WBC 6.8 08/05/2023   NEUTROABS 2,928 08/01/2023   HGB 12.8 08/05/2023   HCT 39.0 08/05/2023   MCV 92.4 08/05/2023   PLT 314 08/05/2023     STUDIES: MR ABDOMEN MRCP W WO  CONTAST Result Date: 08/04/2023 CLINICAL DATA:  Elevated liver function studies. Abnormal ultrasound examination. Nonspecific (abnormal) findings on radiological and other examination of musculoskeletal system EXAM: MRI ABDOMEN WITHOUT AND WITH CONTRAST (INCLUDING MRCP) TECHNIQUE: Multiplanar multisequence MR imaging of the abdomen was performed both before and after the administration of intravenous contrast. Heavily T2-weighted images of the biliary and pancreatic ducts were obtained, and three-dimensional MRCP images were rendered by post processing. CONTRAST:  8mL GADAVIST  GADOBUTROL  1 MMOL/ML IV SOLN COMPARISON:  None Available. FINDINGS: Lower chest: The lung bases are grossly clear. No obvious pulmonary lesions, pleural or pericardial effusion. Hepatobiliary: Significant intra and extrahepatic biliary dilatation. The common bile duct measures up to 15 mm and abruptly becomes very narrowed. There is also marked dilatation of the main pancreatic duct which also becomes markedly narrowed at the same spot (classic double duct sign). There is also cystic duct dilatation and marked distention of  the gallbladder. No hepatic lesions to suggest metastatic disease. Pancreas: Marked distension of the main pancreatic duct along with severe atrophy of the pancreatic body and tail. Hypoenhancing infiltrating lesion involving the lower pancreatic head accounting for the common bile duct and main pancreatic ductal dilatation. This lesion measures approximately 2.8 x 1.8 cm. No obvious involvement of the major vascular structures. The splenic and portal veins are patent. Spleen:  Normal size.  No focal lesions. Adrenals/Urinary Tract: The adrenal glands and kidneys are unremarkable. 9 mm interpolar right renal cyst not requiring any further imaging evaluation follow-up. Stomach/Bowel: The stomach, duodenum, visualized small and visualized colon are grossly normal. Vascular/Lymphatic: The aorta and branch vessels are patent. The major venous structures are patent. No obvious abdominal adenopathy. Other: No ascites or abdominal wall hernia. No subcutaneous lesions. Musculoskeletal: No significant bony findings. IMPRESSION: 1. 2.8 x 1.8 cm hypoenhancing infiltrating lesion involving the lower pancreatic head accounting for the common bile duct and main pancreatic ductal dilatation. Findings consistent with pancreatic adenocarcinoma. 2. No obvious involvement of the major vascular structures. 3. No abdominal adenopathy or hepatic lesions to suggest metastatic disease. Electronically Signed   By: MYRTIS Stammer M.D.   On: 08/04/2023 23:39   MR 3D Recon At Scanner Result Date: 08/04/2023 CLINICAL DATA:  Elevated liver function studies. Abnormal ultrasound examination. Nonspecific (abnormal) findings on radiological and other examination of musculoskeletal system EXAM: MRI ABDOMEN WITHOUT AND WITH CONTRAST (INCLUDING MRCP) TECHNIQUE: Multiplanar multisequence MR imaging of the abdomen was performed both before and after the administration of intravenous contrast. Heavily T2-weighted images of the biliary and pancreatic  ducts were obtained, and three-dimensional MRCP images were rendered by post processing. CONTRAST:  8mL GADAVIST  GADOBUTROL  1 MMOL/ML IV SOLN COMPARISON:  None Available. FINDINGS: Lower chest: The lung bases are grossly clear. No obvious pulmonary lesions, pleural or pericardial effusion. Hepatobiliary: Significant intra and extrahepatic biliary dilatation. The common bile duct measures up to 15 mm and abruptly becomes very narrowed. There is also marked dilatation of the main pancreatic duct which also becomes markedly narrowed at the same spot (classic double duct sign). There is also cystic duct dilatation and marked distention of the gallbladder. No hepatic lesions to suggest metastatic disease. Pancreas: Marked distension of the main pancreatic duct along with severe atrophy of the pancreatic body and tail. Hypoenhancing infiltrating lesion involving the lower pancreatic head accounting for the common bile duct and main pancreatic ductal dilatation. This lesion measures approximately 2.8 x 1.8 cm. No obvious involvement of the major vascular structures. The splenic and portal veins are patent. Spleen:  Normal size.  No focal lesions. Adrenals/Urinary Tract: The adrenal glands and kidneys are unremarkable. 9 mm interpolar right renal cyst not requiring any further imaging evaluation follow-up. Stomach/Bowel: The stomach, duodenum, visualized small and visualized colon are grossly normal. Vascular/Lymphatic: The aorta and branch vessels are patent. The major venous structures are patent. No obvious abdominal adenopathy. Other: No ascites or abdominal wall hernia. No subcutaneous lesions. Musculoskeletal: No significant bony findings. IMPRESSION: 1. 2.8 x 1.8 cm hypoenhancing infiltrating lesion involving the lower pancreatic head accounting for the common bile duct and main pancreatic ductal dilatation. Findings consistent with pancreatic adenocarcinoma. 2. No obvious involvement of the major vascular  structures. 3. No abdominal adenopathy or hepatic lesions to suggest metastatic disease. Electronically Signed   By: MYRTIS Stammer M.D.   On: 08/04/2023 23:39   US  ABDOMEN LIMITED RUQ (LIVER/GB) Result Date: 08/04/2023 CLINICAL DATA:  Upper abdominal pain, decreased appetite, weight loss for months following episode of nausea and vomiting in April. Elevated liver function tests, including alk-phos. EXAM: ULTRASOUND ABDOMEN LIMITED RIGHT UPPER QUADRANT COMPARISON:  None available FINDINGS: Gallbladder: Gallstones: None Sludge: None Gallbladder Wall: Present Pericholecystic fluid: None Sonographic Murphy's Sign: Negative per technologist Gallbladder is dilated measuring up to 13.9 x 5.4 x 4.5 cm Common bile duct: Diameter: 14 mm Liver: Parenchymal echogenicity: Within normal limits Contours: Normal Lesions: None Portal vein: Patent.  Hepatopetal flow Other: None. IMPRESSION: Dilated common bile duct and gallbladder suspicious for stricture, mass, or choledocholith obstructing the flow of bile in the distal common bile duct. Further evaluation with contrast enhanced abdominal MRI/MRCP or ERCP should be performed. Electronically Signed   By: Aliene Lloyd M.D.   On: 08/04/2023 08:39    ASSESSMENT: Pancreatic mass.  PLAN:    Pancreatic mass: Highly suspicious for underlying malignancy.  MRI revealed a 2.8 x 1.8 cm hypoenhancing infiltrating lesion in the pancreatic head.  Patient also noted to have common bile duct and main pancreatic ductal dilation.  CA 19-9 has been ordered and is pending at time of dictation.  Patient undergoing ERCP for stenting and biopsy to obtain diagnosis.  No further intervention is needed from an oncology standpoint at this time.  Once patient is discharged, we will arrange follow-up in the cancer center to discuss her pathology results and treatment planning if necessary. Liver dysfunction: Likely secondary to pancreatic mass.  Stenting as above.  Appreciate consult, will  follow.   Evalene JINNY Reusing, MD   08/05/2023 10:29 AM

## 2023-08-05 NOTE — Op Note (Signed)
 [facility]enter[facility] Patient Name: Colleen [name] Procedure Date: 08/05/2023 10:24 AM MRN: [id number] Account #: [id number] Date of Birth: 09-07-1945 Admit Type: Inpatient Age: 78 Room: [facility] ENDO ROOM 4 Gender: Female Note Status: Finalized Instrument Name: [id number] Procedure:             ERCP Indications:           Tumor of the head of pancreas Providers:             [name] MD, MD Medicines:             Propofol  per Anesthesia Complications:         No immediate complications. Procedure:             Pre-Anesthesia Assessment:                        - Prior to the procedure, a History and Physical was                         performed, and patient medications and allergies were                         reviewed. The patient's tolerance of previous                         anesthesia was also reviewed. The risks and benefits                         of the procedure and the sedation options and risks                         were discussed with the patient. All questions were                         answered, and informed consent was obtained. Prior                         Anticoagulants: The patient has taken Eliquis                         (apixaban), last dose was 1 day prior to procedure.                         ASA Grade Assessment: II - A patient with mild                         systemic disease. After reviewing the risks and                         benefits, the patient was deemed in satisfactory                         condition to undergo the procedure.                        After obtaining informed consent, the scope was passed                         under direct vision.  Throughout the procedure, the                         patient's blood pressure, pulse, and oxygen                         saturations were monitored continuously. The                         Duodenoscope was introduced through the mouth, and                         used  to inject contrast into and used to cannulate the                         dorsal pancreatic duct. The ERCP was accomplished                         without difficulty. The patient tolerated the                         procedure well. Findings:      The scout film was normal. The esophagus was successfully intubated       under direct vision. The scope was advanced to a normal major papilla in       the descending duodenum without detailed examination of the pharynx,       larynx and associated structures, and upper GI tract. The upper GI tract       was grossly normal. Deep cannulation of the ventral pancreatic duct was       accomplished with the short-nosed traction sphincterotome. A wire was       passed into the ventral pancreatic duct. The bile duct was deeply       cannulated with the short-nosed traction sphincterotome. Contrast was       injected. I personally interpreted the bile duct images. The distal CBD       showed a stenosis. The wire was not able to be passed into the CBD. Impression:            - Bile duct stenosis from a pancreatic mass.                        The wire could not be passed into the CBD.                        Wire passed into the PD without difficulty. Recommendation:        - Return patient to hospital ward for ongoing care.                        - Clear liquid diet.                        - Watch for pancreatitis, bleeding, perforation, and                         cholangitis.                        - I have spoken to IR and have ordered a PTC with  brushings. Procedure Code(s):     --- Professional ---                        [phone], Endoscopic retrograde cholangiopancreatography                         (ERCP); diagnostic, including collection of                         specimen(s) by brushing or washing, when performed                         (separate procedure)                        [id number], Endoscopic catheterization of the  biliary                         ductal system, radiological supervision and                         interpretation Diagnosis Code(s):     --- Professional ---                        D49.0, Neoplasm of unspecified behavior of digestive                         system CPT copyright 2022 [employer]. All rights reserved. The codes documented in this report are preliminary and upon coder review may  be revised to meet current compliance requirements. [name] MD, MD 08/05/2023 11:53:53 AM This report has been signed electronically. Number of Addenda: 0 Note Initiated On: 08/05/2023 10:24 AM Estimated Blood Loss:  Estimated blood loss: none.      [facility]

## 2023-08-05 NOTE — Consult Note (Signed)
 Colleen Copping, MD [facility]  [address] [place], [place] [address] Phone: [phone] Fax : [phone]  Consultation  Referring Provider:     Dr. Clarine Primary Care Physician:  Leavy Mole, PA-C Primary Gastroenterologist:  Dr. Therisa         Reason for Consultation:     Abnormal imaging  Date of Admission:  08/04/2023 Date of Consultation:  08/05/2023         HPI:   Colleen [name] is a 78 y.o. female who was sent to the emergency department yesterday by her primary care provider after being found to have abnormal liver enzymes.  She has also reported that her stools were lighter in color for the last 2 weeks.  She also had reported darker urine.  The patient had an ultrasound that showed a dilation of the common bile duct and was sent to the emergency department whereupon she had an MRI.  The MRI showed a lesion in the pancreas consistent with invasive adenocarcinoma of the pancreas.  The patient denies any abdominal pain.  Her most recent labs have shown:  Component     Latest Ref Rng 08/03/2023 08/04/2023 08/05/2023  Total Bilirubin     0.0 - 1.2 mg/dL 3.2 (H)  3.0 (H)  2.8 (H)   AST     15 - 41 U/L 184 (H)  146 (H)  138 (H)   ALT     0 - 44 U/L 223 (H)  172 (H)  161 (H)   Albumin     3.5 - 5.0 g/dL  3.7  3.6   Alkaline Phosphatase     38 - 126 U/L  1,134 (H)  1,003 (H)    The patient reports that she is on Eliquis and took her Eliquis yesterday morning prior to coming to the hospital.  I am now being consulted for an ERCP due to obstructive jaundice from a pancreatic head mass.  Past Medical History:  Diagnosis Date   Asthma    Cataract    COPD (chronic obstructive pulmonary disease) (HCC)    GERD (gastroesophageal reflux disease)    Gout    History of pulmonary embolus (PE)    Hyperlipidemia    Hypertension    Osteoarthrosis    Prediabetes     Past Surgical History:  Procedure Laterality Date   BACK SURGERY     BREAST BIOPSY Left 04/08/2014   intraductal  papilloma 5 mm   COLONOSCOPY WITH PROPOFOL  N/A 04/23/2020   Procedure: COLONOSCOPY WITH PROPOFOL ;  Surgeon: Janalyn Keene NOVAK, MD;  Location: [facility] ENDOSCOPY;  Service: Endoscopy;  Laterality: N/A;   ESOPHAGOGASTRODUODENOSCOPY (EGD) WITH PROPOFOL  N/A 04/23/2020   Procedure: ESOPHAGOGASTRODUODENOSCOPY (EGD) WITH PROPOFOL ;  Surgeon: Janalyn Keene NOVAK, MD;  Location: [facility] ENDOSCOPY;  Service: Endoscopy;  Laterality: N/A;   GIVENS CAPSULE STUDY N/A 01/28/2022   Procedure: GIVENS CAPSULE STUDY;  Surgeon: Therisa Bi, MD;  Location: [facility] ENDOSCOPY;  Service: Gastroenterology;  Laterality: N/A;   LUMBAR LAMINECTOMY     TONSILLECTOMY      Prior to Admission medications   Medication Sig Start Date End Date Taking? Authorizing Provider  albuterol  (VENTOLIN  HFA) 108 (90 Base) MCG/ACT inhaler Inhale 2 puffs into the lungs every 4 (four) hours as needed for wheezing or shortness of breath. 03/31/23  Yes Tapia, Leisa, PA-C  allopurinol  (ZYLOPRIM ) 100 MG tablet Take 2 tablets (200 mg total) by mouth daily. 08/01/23  Yes Tapia, Leisa, PA-C  apixaban (ELIQUIS) 2.5 MG TABS  tablet Take 2.5 mg by mouth 2 (two) times daily.   Yes [provider]  Budeson-Glycopyrrol-Formoterol  (BREZTRI  AEROSPHERE) 160-9-4.8 MCG/ACT AERO Inhale 2 puffs into the lungs 2 (two) times daily. 04/19/22  Yes Tapia, Leisa, PA-C  enalapril  (VASOTEC ) 2.5 MG tablet Take 1 tablet (2.5 mg total) by mouth daily. 10/05/22  Yes Tapia, Leisa, PA-C  hydrochlorothiazide  (MICROZIDE ) 12.5 MG capsule Take 1 capsule (12.5 mg total) by mouth daily. 08/01/23  Yes Tapia, Leisa, PA-C  lovastatin  (MEVACOR ) 40 MG tablet Take 1 tablet (40 mg total) by mouth at bedtime. 03/31/23  Yes Tapia, Leisa, PA-C  Multiple Vitamins-Minerals (MULTIVITAMIN WOMEN 50+ PO) Take 1 tablet by mouth daily.   Yes [provider]  pantoprazole  (PROTONIX ) 40 MG tablet Take 1 tablet (40 mg total) by mouth daily. 08/01/23  Yes Tapia, Leisa, PA-C  simethicone  (GAS-X) 80 MG  chewable tablet Chew 1 tablet (80 mg total) by mouth 2 (two) times a day. 08/02/18 06/17/28 Yes Dorothyann Drivers, MD  triamcinolone  cream (KENALOG ) 0.1 % APPLY TO AFFECTED AREA TWICE DAILY IF NEEDED. NO MORE THAN 1 WEEK AT A TIME, THEN TAKE A BREAK FOR 2 WEEKS. 02/10/22  Yes Bernardo Fend, DO  acetaminophen  (TYLENOL ) 650 MG CR tablet Take 650 mg by mouth every 8 (eight) hours as needed for pain. Patient not taking: Reported on 04/11/2023    [provider]  Blood Pressure Monitoring (ADULT BLOOD PRESSURE CUFF LG) KIT 1 each by Does not apply route daily. 09/01/21   Bernardo Fend, DO  cholecalciferol (VITAMIN D3) 25 MCG (1000 UNIT) tablet Take 1,000 Units by mouth daily. Patient not taking: Reported on 08/05/2023    [provider]  ELIQUIS 5 MG TABS tablet Take 5 mg by mouth 2 (two) times daily. Patient not taking: Reported on 08/05/2023 04/21/19   [provider]  famotidine  (PEPCID ) 20 MG tablet Take 20 mg by mouth daily as needed for heartburn or indigestion. Patient not taking: Reported on 08/05/2023    [provider]    Family History  Problem Relation Age of Onset   Diabetes Mother    Lung cancer Father    Diabetes Sister    Congestive Heart Failure Sister    Glaucoma Sister    Breast cancer Maternal Aunt      Social History   Tobacco Use   Smoking status: Former    Current packs/day: 0.00    Average packs/day: 1.5 packs/day for 20.0 years (30.0 ttl pk-yrs)    Types: Cigarettes    Start date: 11/13/1988    Quit date: 11/13/2008    Years since quitting: 14.7   Smokeless tobacco: Never   Tobacco comments:    smoking cessation materials not required  Vaping Use   Vaping status: Never Used  Substance Use Topics   Alcohol use: Not Currently   Drug use: No    Allergies as of 08/04/2023 - Review Complete 08/04/2023  Allergen Reaction Noted   Nsaids  06/24/2022    Review of Systems:    All systems reviewed and negative except where  noted in HPI.   Physical Exam:  Vital signs in last 24 hours: Temp:  [97.6 F (36.4 C)-98 F (36.7 C)] 97.6 F (36.4 C) (07/11 0212) Pulse Rate:  [59-83] 59 (07/11 0640) Resp:  [13-18] 13 (07/11 0640) BP: (99-129)/(58-74) 122/58 (07/11 0640) SpO2:  [96 %-100 %] 100 % (07/11 0640) Weight:  [83 kg] 83 kg (07/10 1606)   General:   Pleasant, cooperative in NAD  Head:  Normocephalic and atraumatic. Eyes:   No icterus.   Conjunctiva pink. PERRLA. Ears:  Normal auditory acuity. Neck:  Supple; no masses or thyroidomegaly Lungs: Respirations even and unlabored. Lungs clear to auscultation bilaterally.   No wheezes, crackles, or rhonchi.  Heart:  Regular rate and rhythm;  Without murmur, clicks, rubs or gallops Abdomen:  Soft, nondistended, nontender. Normal bowel sounds. No appreciable masses or hepatomegaly.  No rebound or guarding.  Rectal:  Not performed. Msk:  Symmetrical without gross deformities.    Extremities:  Without edema, cyanosis or clubbing. Neurologic:  Alert and oriented x3;  grossly normal neurologically. Skin:  Intact without significant lesions or rashes. Cervical Nodes:  No significant cervical adenopathy. Psych:  Alert and cooperative. Normal affect.  LAB RESULTS: Recent Labs    08/04/23 1608 08/05/23 0348  WBC 7.1 6.8  HGB 13.0 12.8  HCT 38.0 39.0  PLT 324 314   BMET Recent Labs    08/03/23 1203 08/04/23 1608 08/05/23 0348  NA 135 134* 137  K 4.1 3.4* 3.3*  CL 96* 97* 100  CO2 28 25 28   GLUCOSE 117* 135* 116*  BUN 13 18 15   CREATININE 0.88 1.02* 0.33*  CALCIUM  10.5* 9.6 9.4   LFT Recent Labs    08/05/23 0348  PROT 7.7  ALBUMIN 3.6  AST 138*  ALT 161*  ALKPHOS 1,003*  BILITOT 2.8*   PT/INR Recent Labs    08/04/23 2051  LABPROT 15.2  INR 1.1    STUDIES: MR ABDOMEN MRCP W WO CONTAST Result Date: 08/04/2023 CLINICAL DATA:  Elevated liver function studies. Abnormal ultrasound examination. Nonspecific (abnormal) findings on  radiological and other examination of musculoskeletal system EXAM: MRI ABDOMEN WITHOUT AND WITH CONTRAST (INCLUDING MRCP) TECHNIQUE: Multiplanar multisequence MR imaging of the abdomen was performed both before and after the administration of intravenous contrast. Heavily T2-weighted images of the biliary and pancreatic ducts were obtained, and three-dimensional MRCP images were rendered by post processing. CONTRAST:  8mL GADAVIST  GADOBUTROL  1 MMOL/ML IV SOLN COMPARISON:  None Available. FINDINGS: Lower chest: The lung bases are grossly clear. No obvious pulmonary lesions, pleural or pericardial effusion. Hepatobiliary: Significant intra and extrahepatic biliary dilatation. The common bile duct measures up to 15 mm and abruptly becomes very narrowed. There is also marked dilatation of the main pancreatic duct which also becomes markedly narrowed at the same spot (classic double duct sign). There is also cystic duct dilatation and marked distention of the gallbladder. No hepatic lesions to suggest metastatic disease. Pancreas: Marked distension of the main pancreatic duct along with severe atrophy of the pancreatic body and tail. Hypoenhancing infiltrating lesion involving the lower pancreatic head accounting for the common bile duct and main pancreatic ductal dilatation. This lesion measures approximately 2.8 x 1.8 cm. No obvious involvement of the major vascular structures. The splenic and portal veins are patent. Spleen:  Normal size.  No focal lesions. Adrenals/Urinary Tract: The adrenal glands and kidneys are unremarkable. 9 mm interpolar right renal cyst not requiring any further imaging evaluation follow-up. Stomach/Bowel: The stomach, duodenum, visualized small and visualized colon are grossly normal. Vascular/Lymphatic: The aorta and branch vessels are patent. The major venous structures are patent. No obvious abdominal adenopathy. Other: No ascites or abdominal wall hernia. No subcutaneous lesions.  Musculoskeletal: No significant bony findings. IMPRESSION: 1. 2.8 x 1.8 cm hypoenhancing infiltrating lesion involving the lower pancreatic head accounting for the common bile duct and main pancreatic ductal dilatation. Findings consistent with pancreatic adenocarcinoma. 2. No obvious  involvement of the major vascular structures. 3. No abdominal adenopathy or hepatic lesions to suggest metastatic disease. Electronically Signed   By: MYRTIS Stammer M.D.   On: 08/04/2023 23:39   MR 3D Recon At Scanner Result Date: 08/04/2023 CLINICAL DATA:  Elevated liver function studies. Abnormal ultrasound examination. Nonspecific (abnormal) findings on radiological and other examination of musculoskeletal system EXAM: MRI ABDOMEN WITHOUT AND WITH CONTRAST (INCLUDING MRCP) TECHNIQUE: Multiplanar multisequence MR imaging of the abdomen was performed both before and after the administration of intravenous contrast. Heavily T2-weighted images of the biliary and pancreatic ducts were obtained, and three-dimensional MRCP images were rendered by post processing. CONTRAST:  8mL GADAVIST  GADOBUTROL  1 MMOL/ML IV SOLN COMPARISON:  None Available. FINDINGS: Lower chest: The lung bases are grossly clear. No obvious pulmonary lesions, pleural or pericardial effusion. Hepatobiliary: Significant intra and extrahepatic biliary dilatation. The common bile duct measures up to 15 mm and abruptly becomes very narrowed. There is also marked dilatation of the main pancreatic duct which also becomes markedly narrowed at the same spot (classic double duct sign). There is also cystic duct dilatation and marked distention of the gallbladder. No hepatic lesions to suggest metastatic disease. Pancreas: Marked distension of the main pancreatic duct along with severe atrophy of the pancreatic body and tail. Hypoenhancing infiltrating lesion involving the lower pancreatic head accounting for the common bile duct and main pancreatic ductal dilatation. This  lesion measures approximately 2.8 x 1.8 cm. No obvious involvement of the major vascular structures. The splenic and portal veins are patent. Spleen:  Normal size.  No focal lesions. Adrenals/Urinary Tract: The adrenal glands and kidneys are unremarkable. 9 mm interpolar right renal cyst not requiring any further imaging evaluation follow-up. Stomach/Bowel: The stomach, duodenum, visualized small and visualized colon are grossly normal. Vascular/Lymphatic: The aorta and branch vessels are patent. The major venous structures are patent. No obvious abdominal adenopathy. Other: No ascites or abdominal wall hernia. No subcutaneous lesions. Musculoskeletal: No significant bony findings. IMPRESSION: 1. 2.8 x 1.8 cm hypoenhancing infiltrating lesion involving the lower pancreatic head accounting for the common bile duct and main pancreatic ductal dilatation. Findings consistent with pancreatic adenocarcinoma. 2. No obvious involvement of the major vascular structures. 3. No abdominal adenopathy or hepatic lesions to suggest metastatic disease. Electronically Signed   By: MYRTIS Stammer M.D.   On: 08/04/2023 23:39   US  ABDOMEN LIMITED RUQ (LIVER/GB) Result Date: 08/04/2023 CLINICAL DATA:  Upper abdominal pain, decreased appetite, weight loss for months following episode of nausea and vomiting in April. Elevated liver function tests, including alk-phos. EXAM: ULTRASOUND ABDOMEN LIMITED RIGHT UPPER QUADRANT COMPARISON:  None available FINDINGS: Gallbladder: Gallstones: None Sludge: None Gallbladder Wall: Present Pericholecystic fluid: None Sonographic Murphy's Sign: Negative per technologist Gallbladder is dilated measuring up to 13.9 x 5.4 x 4.5 cm Common bile duct: Diameter: 14 mm Liver: Parenchymal echogenicity: Within normal limits Contours: Normal Lesions: None Portal vein: Patent.  Hepatopetal flow Other: None. IMPRESSION: Dilated common bile duct and gallbladder suspicious for stricture, mass, or choledocholith  obstructing the flow of bile in the distal common bile duct. Further evaluation with contrast enhanced abdominal MRI/MRCP or ERCP should be performed. Electronically Signed   By: Aliene Lloyd M.D.   On: 08/04/2023 08:39      Impression / Plan:   Assessment: Principal Problem:   Pancreatic mass Active Problems:   Benign essential HTN   COPD (chronic obstructive pulmonary disease) (HCC)   Gout   HLD (hyperlipidemia)   Prediabetes   Abnormal  LFTs   Chronic kidney disease, stage 3a (HCC)   Obesity (BMI 30-39.9)   Atrial fibrillation, chronic (HCC)   Pulmonary embolism (HCC)   Hypokalemia   Colleen [name] is a 78 y.o. y/o female with who was sent by her primary care provider for abnormal liver enzymes.  The patient also had an abnormal ultrasound showing dilation of the common bile duct.  The patient then had a subsequent MRCP that showed a pancreatic head mass causing the obstruction.  The patient took her Eliquis yesterday morning.  Plan:  The patient will be set up for an ERCP with a stent placement without a sphincterotomy due to the patient's recent Eliquis use.  The patient will also follow-up with oncology.  Brushings of the bile duct will also be attempted today.  The patient has been explained the procedure including possible inability to cannulate, pancreatitis and death.  She has been told about the possible alternatives.  The patient has agreed to undergoing an ERCP today.   Thank you for involving me in the care of this patient.      LOS: 1 day   Colleen Copping, MD, MD. NOLIA 08/05/2023, 9:06 AM,  Pager [phone] 7am-5pm  Check AMION for 5pm -7am coverage and on weekends   Note: This dictation was prepared with Dragon dictation along with smaller phrase technology. Any transcriptional errors that result from this process are unintentional.

## 2023-08-05 NOTE — Consult Note (Signed)
 Chief Complaint: Patient was seen in consultation today for  Chief Complaint  Patient presents with   abnormal labs   Referring Physician(s): Dr. Jinny   Supervising Physician: Hughes Simmonds  Patient Status: [facility] - In-pt  History of Present Illness: Colleen [name] is a 78 y.o. female with a medical history significant for hypertension, hyperlipidemia, prediabetes, COPD/asthma, gout, GERD, CKD-3, A-fib and PE on Eliquis, lumbar spinal stenosis, who presented to the ED 08/04/23 with abnormal lab work, dark urine and lighter stool. Patient stated that in the past 2 weeks, she noticed her urine is dark and her stool is lighter in color. She had a regular visit with PCP on 7/7 who did some labs and found that her liver function was abnormal. Then right upper quadrant ultrasound was ordered which showed a dilated common bile duct. The patient was called by her PCP and instructed to come to the hospital for further evaluation and treatment.  An MRCP was ordered and this was positive for a 2.8 x 1.8 cm pancreatic mass consistent with adenocarcinoma. GI was consulted and she went to Endo today for an ERCP with stent placement. Unfortunately the wire could not be passed into the CBD and IR was consulted for biliary drain placement with brushings. Imaging reviewed and procedure approved by Dr. Hughes.   Patient is resting in bed in no acute distress. States that she has not been experiencing any pain, but admits to decreased appetite, light colored stool, and dark urine for the past 3 weeks. She denies any nausea/vomiting, abdominal pain, fevers/chills, or chest pain. NPO since midnight. All questions and concerns answered at the bedside.  Past Medical History:  Diagnosis Date   Asthma    Cataract    COPD (chronic obstructive pulmonary disease) (HCC)    GERD (gastroesophageal reflux disease)    Gout    History of pulmonary embolus (PE)    Hyperlipidemia    Hypertension    Osteoarthrosis     Prediabetes     Past Surgical History:  Procedure Laterality Date   BACK SURGERY     BREAST BIOPSY Left 04/08/2014   intraductal papilloma 5 mm   COLONOSCOPY WITH PROPOFOL  N/A 04/23/2020   Procedure: COLONOSCOPY WITH PROPOFOL ;  Surgeon: Janalyn Keene NOVAK, MD;  Location: [facility] ENDOSCOPY;  Service: Endoscopy;  Laterality: N/A;   ESOPHAGOGASTRODUODENOSCOPY (EGD) WITH PROPOFOL  N/A 04/23/2020   Procedure: ESOPHAGOGASTRODUODENOSCOPY (EGD) WITH PROPOFOL ;  Surgeon: Janalyn Keene NOVAK, MD;  Location: [facility] ENDOSCOPY;  Service: Endoscopy;  Laterality: N/A;   GIVENS CAPSULE STUDY N/A 01/28/2022   Procedure: GIVENS CAPSULE STUDY;  Surgeon: Therisa Bi, MD;  Location: [facility] ENDOSCOPY;  Service: Gastroenterology;  Laterality: N/A;   LUMBAR LAMINECTOMY     TONSILLECTOMY      Allergies: Nsaids  Medications: Prior to Admission medications   Medication Sig Start Date End Date Taking? Authorizing Provider  albuterol  (VENTOLIN  HFA) 108 (90 Base) MCG/ACT inhaler Inhale 2 puffs into the lungs every 4 (four) hours as needed for wheezing or shortness of breath. 03/31/23  Yes Tapia, Leisa, PA-C  allopurinol  (ZYLOPRIM ) 100 MG tablet Take 2 tablets (200 mg total) by mouth daily. 08/01/23  Yes Tapia, Leisa, PA-C  apixaban (ELIQUIS) 2.5 MG TABS tablet Take 2.5 mg by mouth 2 (two) times daily.   Yes [provider]  Budeson-Glycopyrrol-Formoterol  (BREZTRI  AEROSPHERE) 160-9-4.8 MCG/ACT AERO Inhale 2 puffs into the lungs 2 (two) times daily. 04/19/22  Yes Tapia, Leisa, PA-C  enalapril  (VASOTEC ) 2.5 MG tablet Take 1 tablet (  2.5 mg total) by mouth daily. 10/05/22  Yes Tapia, Leisa, PA-C  hydrochlorothiazide  (MICROZIDE ) 12.5 MG capsule Take 1 capsule (12.5 mg total) by mouth daily. 08/01/23  Yes Tapia, Leisa, PA-C  lovastatin  (MEVACOR ) 40 MG tablet Take 1 tablet (40 mg total) by mouth at bedtime. 03/31/23  Yes Tapia, Leisa, PA-C  Multiple Vitamins-Minerals (MULTIVITAMIN WOMEN 50+ PO) Take 1 tablet by mouth daily.   Yes  [provider]  pantoprazole  (PROTONIX ) 40 MG tablet Take 1 tablet (40 mg total) by mouth daily. 08/01/23  Yes Tapia, Leisa, PA-C  simethicone  (GAS-X) 80 MG chewable tablet Chew 1 tablet (80 mg total) by mouth 2 (two) times a day. 08/02/18 06/17/28 Yes Dorothyann Drivers, MD  triamcinolone  cream (KENALOG ) 0.1 % APPLY TO AFFECTED AREA TWICE DAILY IF NEEDED. NO MORE THAN 1 WEEK AT A TIME, THEN TAKE A BREAK FOR 2 WEEKS. 02/10/22  Yes Bernardo Fend, DO  acetaminophen  (TYLENOL ) 650 MG CR tablet Take 650 mg by mouth every 8 (eight) hours as needed for pain. Patient not taking: Reported on 04/11/2023    [provider]  Blood Pressure Monitoring (ADULT BLOOD PRESSURE CUFF LG) KIT 1 each by Does not apply route daily. 09/01/21   Bernardo Fend, DO  cholecalciferol (VITAMIN D3) 25 MCG (1000 UNIT) tablet Take 1,000 Units by mouth daily. Patient not taking: Reported on 08/05/2023    [provider]  ELIQUIS 5 MG TABS tablet Take 5 mg by mouth 2 (two) times daily. Patient not taking: Reported on 08/05/2023 04/21/19   [provider]  famotidine  (PEPCID ) 20 MG tablet Take 20 mg by mouth daily as needed for heartburn or indigestion. Patient not taking: Reported on 08/05/2023    [provider]     Family History  Problem Relation Age of Onset   Diabetes Mother    Lung cancer Father    Diabetes Sister    Congestive Heart Failure Sister    Glaucoma Sister    Breast cancer Maternal Aunt     Social History   Socioeconomic History   Marital status: Widowed    Spouse name: Susann   Number of children: 0   Years of education: Not on file   Highest education level: 12th grade  Occupational History   Occupation: Retired  Tobacco Use   Smoking status: Former    Current packs/day: 0.00    Average packs/day: 1.5 packs/day for 20.0 years (30.0 ttl pk-yrs)    Types: Cigarettes    Start date: 11/13/1988    Quit date: 11/13/2008    Years since quitting: 14.7    Smokeless tobacco: Never   Tobacco comments:    smoking cessation materials not required  Vaping Use   Vaping status: Never Used  Substance and Sexual Activity   Alcohol use: Not Currently   Drug use: No   Sexual activity: Not Currently  Other Topics Concern   Not on file  Social History Narrative   Pt lives alone   Social Drivers of Health   Financial Resource Strain: Low Risk  (12/02/2022)   Overall Financial Resource Strain (CARDIA)    Difficulty of Paying Living Expenses: Not very hard  Food Insecurity: No Food Insecurity (12/02/2022)   Hunger Vital Sign    Worried About Running Out of Food in the Last Year: Never true    Ran Out of Food in the Last Year: Never true  Transportation Needs: No Transportation Needs (12/02/2022)   PRAPARE - Transportation    Lack of  Transportation (Medical): No    Lack of Transportation (Non-Medical): No  Physical Activity: Insufficiently Active (12/02/2022)   Exercise Vital Sign    Days of Exercise per Week: 2 days    Minutes of Exercise per Session: 60 min  Stress: No Stress Concern Present (12/02/2022)   [employer] of Occupational Health - Occupational Stress Questionnaire    Feeling of Stress : Not at all  Social Connections: Moderately Isolated (12/02/2022)   Social Connection and Isolation Panel    Frequency of Communication with Friends and Family: More than three times a week    Frequency of Social Gatherings with Friends and Family: Twice a week    Attends Religious Services: More than 4 times per year    Active Member of [employer] or Organizations: No    Attends Banker Meetings: Never    Marital Status: Widowed    Review of Systems: A 12 point ROS discussed and pertinent positives are indicated in the HPI above.  All other systems are negative.  Review of Systems  Constitutional:  Positive for appetite change (decreased).  Gastrointestinal:        Light colored stool   Genitourinary:        Darker urine     Vital Signs: BP (!) 113/55   Pulse (!) 57   Temp 98 F (36.7 C) (Oral)   Resp 15   Ht 5' 4 (1.626 m)   Wt 183 lb (83 kg)   SpO2 98%   BMI 31.41 kg/m   Physical Exam Vitals reviewed.  Constitutional:      Appearance: Normal appearance.  HENT:     Head: Normocephalic and atraumatic.     Mouth/Throat:     Mouth: Mucous membranes are moist.     Pharynx: Oropharynx is clear.  Cardiovascular:     Rate and Rhythm: Normal rate and regular rhythm.     Heart sounds: Normal heart sounds.  Pulmonary:     Effort: Pulmonary effort is normal.     Breath sounds: Normal breath sounds.  Abdominal:     General: Abdomen is flat.     Palpations: Abdomen is soft.     Tenderness: There is no abdominal tenderness.  Musculoskeletal:        General: Normal range of motion.     Cervical back: Normal range of motion.  Skin:    General: Skin is warm and dry.  Neurological:     General: No focal deficit present.     Mental Status: She is alert and oriented to person, place, and time. Mental status is at baseline.  Psychiatric:        Mood and Affect: Mood normal.        Behavior: Behavior normal.        Judgment: Judgment normal.     Imaging: DG C-Arm 1-60 Min-No Report Result Date: 08/05/2023 Fluoroscopy was utilized by the requesting physician.  No radiographic interpretation.   MR ABDOMEN MRCP W WO CONTAST Result Date: 08/04/2023 CLINICAL DATA:  Elevated liver function studies. Abnormal ultrasound examination. Nonspecific (abnormal) findings on radiological and other examination of musculoskeletal system EXAM: MRI ABDOMEN WITHOUT AND WITH CONTRAST (INCLUDING MRCP) TECHNIQUE: Multiplanar multisequence MR imaging of the abdomen was performed both before and after the administration of intravenous contrast. Heavily T2-weighted images of the biliary and pancreatic ducts were obtained, and three-dimensional MRCP images were rendered by post processing. CONTRAST:  8mL GADAVIST  GADOBUTROL  1  MMOL/ML IV SOLN COMPARISON:  None Available. FINDINGS:  Lower chest: The lung bases are grossly clear. No obvious pulmonary lesions, pleural or pericardial effusion. Hepatobiliary: Significant intra and extrahepatic biliary dilatation. The common bile duct measures up to 15 mm and abruptly becomes very narrowed. There is also marked dilatation of the main pancreatic duct which also becomes markedly narrowed at the same spot (classic double duct sign). There is also cystic duct dilatation and marked distention of the gallbladder. No hepatic lesions to suggest metastatic disease. Pancreas: Marked distension of the main pancreatic duct along with severe atrophy of the pancreatic body and tail. Hypoenhancing infiltrating lesion involving the lower pancreatic head accounting for the common bile duct and main pancreatic ductal dilatation. This lesion measures approximately 2.8 x 1.8 cm. No obvious involvement of the major vascular structures. The splenic and portal veins are patent. Spleen:  Normal size.  No focal lesions. Adrenals/Urinary Tract: The adrenal glands and kidneys are unremarkable. 9 mm interpolar right renal cyst not requiring any further imaging evaluation follow-up. Stomach/Bowel: The stomach, duodenum, visualized small and visualized colon are grossly normal. Vascular/Lymphatic: The aorta and branch vessels are patent. The major venous structures are patent. No obvious abdominal adenopathy. Other: No ascites or abdominal wall hernia. No subcutaneous lesions. Musculoskeletal: No significant bony findings. IMPRESSION: 1. 2.8 x 1.8 cm hypoenhancing infiltrating lesion involving the lower pancreatic head accounting for the common bile duct and main pancreatic ductal dilatation. Findings consistent with pancreatic adenocarcinoma. 2. No obvious involvement of the major vascular structures. 3. No abdominal adenopathy or hepatic lesions to suggest metastatic disease. Electronically Signed   By: MYRTIS Stammer M.D.    On: 08/04/2023 23:39   MR 3D Recon At Scanner Result Date: 08/04/2023 CLINICAL DATA:  Elevated liver function studies. Abnormal ultrasound examination. Nonspecific (abnormal) findings on radiological and other examination of musculoskeletal system EXAM: MRI ABDOMEN WITHOUT AND WITH CONTRAST (INCLUDING MRCP) TECHNIQUE: Multiplanar multisequence MR imaging of the abdomen was performed both before and after the administration of intravenous contrast. Heavily T2-weighted images of the biliary and pancreatic ducts were obtained, and three-dimensional MRCP images were rendered by post processing. CONTRAST:  8mL GADAVIST  GADOBUTROL  1 MMOL/ML IV SOLN COMPARISON:  None Available. FINDINGS: Lower chest: The lung bases are grossly clear. No obvious pulmonary lesions, pleural or pericardial effusion. Hepatobiliary: Significant intra and extrahepatic biliary dilatation. The common bile duct measures up to 15 mm and abruptly becomes very narrowed. There is also marked dilatation of the main pancreatic duct which also becomes markedly narrowed at the same spot (classic double duct sign). There is also cystic duct dilatation and marked distention of the gallbladder. No hepatic lesions to suggest metastatic disease. Pancreas: Marked distension of the main pancreatic duct along with severe atrophy of the pancreatic body and tail. Hypoenhancing infiltrating lesion involving the lower pancreatic head accounting for the common bile duct and main pancreatic ductal dilatation. This lesion measures approximately 2.8 x 1.8 cm. No obvious involvement of the major vascular structures. The splenic and portal veins are patent. Spleen:  Normal size.  No focal lesions. Adrenals/Urinary Tract: The adrenal glands and kidneys are unremarkable. 9 mm interpolar right renal cyst not requiring any further imaging evaluation follow-up. Stomach/Bowel: The stomach, duodenum, visualized small and visualized colon are grossly normal. Vascular/Lymphatic:  The aorta and branch vessels are patent. The major venous structures are patent. No obvious abdominal adenopathy. Other: No ascites or abdominal wall hernia. No subcutaneous lesions. Musculoskeletal: No significant bony findings. IMPRESSION: 1. 2.8 x 1.8 cm hypoenhancing infiltrating lesion involving the lower pancreatic  head accounting for the common bile duct and main pancreatic ductal dilatation. Findings consistent with pancreatic adenocarcinoma. 2. No obvious involvement of the major vascular structures. 3. No abdominal adenopathy or hepatic lesions to suggest metastatic disease. Electronically Signed   By: MYRTIS Stammer M.D.   On: 08/04/2023 23:39   US  ABDOMEN LIMITED RUQ (LIVER/GB) Result Date: 08/04/2023 CLINICAL DATA:  Upper abdominal pain, decreased appetite, weight loss for months following episode of nausea and vomiting in April. Elevated liver function tests, including alk-phos. EXAM: ULTRASOUND ABDOMEN LIMITED RIGHT UPPER QUADRANT COMPARISON:  None available FINDINGS: Gallbladder: Gallstones: None Sludge: None Gallbladder Wall: Present Pericholecystic fluid: None Sonographic Murphy's Sign: Negative per technologist Gallbladder is dilated measuring up to 13.9 x 5.4 x 4.5 cm Common bile duct: Diameter: 14 mm Liver: Parenchymal echogenicity: Within normal limits Contours: Normal Lesions: None Portal vein: Patent.  Hepatopetal flow Other: None. IMPRESSION: Dilated common bile duct and gallbladder suspicious for stricture, mass, or choledocholith obstructing the flow of bile in the distal common bile duct. Further evaluation with contrast enhanced abdominal MRI/MRCP or ERCP should be performed. Electronically Signed   By: Aliene Lloyd M.D.   On: 08/04/2023 08:39    Labs:  CBC: Recent Labs    03/31/23 0938 08/01/23 1045 08/04/23 1608 08/05/23 0348  WBC 10.1 6.1 7.1 6.8  HGB 13.7 13.4 13.0 12.8  HCT 40.7 40.2 38.0 39.0  PLT 344 343 324 314    COAGS: Recent Labs    08/04/23 2051  INR  1.1  APTT 34    BMP: Recent Labs    08/01/23 1045 08/03/23 1203 08/04/23 1608 08/05/23 0348  NA 133* 135 134* 137  K 4.0 4.1 3.4* 3.3*  CL 95* 96* 97* 100  CO2 27 28 25 28   GLUCOSE 105* 117* 135* 116*  BUN 13 13 18 15   CALCIUM  10.0 10.5* 9.6 9.4  CREATININE 0.77 0.88 1.02* 0.33*  GFRNONAA  --   --  57* >60    LIVER FUNCTION TESTS: Recent Labs    08/01/23 1045 08/03/23 1203 08/04/23 1608 08/05/23 0348  BILITOT 3.5* 3.2* 3.0* 2.8*  AST 268* 184* 146* 138*  ALT 279* 223* 172* 161*  ALKPHOS  --   --  1,134* 1,003*  PROT 7.6 8.1 7.7 7.7  ALBUMIN  --   --  3.7 3.6    TUMOR MARKERS: No results for input(s): AFPTM, CEA, CA199, CHROMGRNA in the last 8760 hours.  Assessment and Plan:  Pancreatic mass with biliary obstruction and jaundice: Colleen [name], 78 year old female, is tentatively scheduled 08/08/23 for an image-guided percutaneous transhepatic biliary drain placement with possible brush biopsy.  -NPO since midnight -INR 1.1 -Last dose Eliquis on 7/10 -Plan for biliary drain placement with Dr. JINNY Hall  Risks and benefits of this procedure were discussed with the patient including, but not limited to bleeding, infection which may lead to sepsis or even death and damage to adjacent structures.  This interventional procedure involves the use of X-rays and because of the nature of the planned procedure, it is possible that we will have prolonged use of X-ray fluoroscopy.  Potential radiation risks to you include (but are not limited to) the following: - A slightly elevated risk for cancer  several years later in life. This risk is typically less than 0.5% percent. This risk is low in comparison to the normal incidence of human cancer, which is 33% for women and 50% for men according to the American Cancer Society. - Radiation induced  injury can include skin redness, resembling a rash, tissue breakdown / ulcers and hair loss (which can be temporary or  permanent).   The likelihood of either of these occurring depends on the difficulty of the procedure and whether you are sensitive to radiation due to previous procedures, disease, or genetic conditions.   IF your procedure requires a prolonged use of radiation, you will be notified and given written instructions for further action.  It is your responsibility to monitor the irradiated area for the 2 weeks following the procedure and to notify your physician if you are concerned that you have suffered a radiation induced injury.    All of the patient's questions were answered, patient is agreeable to proceed.  Consent signed and in chart.  Thank you for this interesting consult.  I greatly enjoyed meeting Colleen [name] and look forward to participating in their care.  A copy of this report was sent to the requesting provider on this date.  Electronically Signed: Warren Dais, AGACNP-BC 08/05/2023, 1:58 PM   I spent a total of 20 Minutes    in face to face in clinical consultation, greater than 50% of which was counseling/coordinating care for pancreatic mass.

## 2023-08-05 NOTE — Progress Notes (Signed)
 Progress Note   Patient: Colleen [name] FMW:[id number] DOB: 12-07-1945 DOA: 08/04/2023     1 DOS: the patient was seen and examined on 08/05/2023   Brief hospital course: YANIS JUMA is a 78 y.o. female with medical history significant of hypertension, hyperlipidemia, prediabetes, COPD/asthma, gout, GERD, CKD-3, A-fib and PE on Eliquis, lumbar spinal stenosis, who presents with abnormal lab work, dark urine and lighter stool.   Patient states that in the past 2 weeks, she noticed her urine is dark and her stool is lighter in color.  She had a regular visit with PCP on 7/7 who did some lab and found that her liver function was abnormal.  Then right upper quadrant ultrasound was ordered, which showed dilated common bile duct today.  Patient was called  by her PCP and instructed to come to the hospital for further evaluation and treatment.  Patient denies nausea, vomiting, diarrhea or abdominal pain.  No fever or chills.  No chest pain, cough, SOB.  No symptoms of UTI.  Chart review revealed that patient had negative hepatitis panel on 08/03/2023.    Assessment and Plan:  Pancreatic mass:  MRCP showed pancreatic lesion causing common bile duct and pancreatic ductal dilation, consistent with pancreatic adenocarcinoma, no signs of metastatic disease yet. Gastroenterologist on board for ERCP with stent placement today Oncologist Dr. Jacobo on board and case discussed Oncology would like to follow patient after discharge Follow-up CA-19-9 We have switched Eliquis to IV heparin temporarily    Abnormal LFTs: Secondary to pancreatic mass compression effects. - Hold lovastatin  temporarily - Judicious use low-dose prn Tylenol  325 mg (patient is allergic to NSAID) Monitor LFTs closely   Benign essential HTN -Continue enalapril   - Hold HCTZ temporarily while patient is on n.p.o. - IV hydralazine  as needed   HLD (hyperlipidemia) -Hold lovastatin  due to abnormal liver function   COPD (chronic  obstructive pulmonary disease) (HCC): Stable -Bronchodilators and as needed Mucinex    Gout -Allopurinol    Prediabetes: Recent A1c 5.7, well-controlled.  Patient not taking medications currently. -Check CBG every morning   Hypokalemia: Potassium 3.4, magnesium 2.0 -Repleted potassium   Chronic kidney disease, stage 3a (HCC): Renal function is close to baseline.  Recent baseline creatinine 0.8-1.0, her creatinine is 1.02, BUN 18, GFR 57 -Follow-up with BMP   Atrial fibrillation, chronic (HCC): Heart rate 80s - Switch Eliquis to IV heparin temporarily for possible procedure   Pulmonary embolism (HCC) -IV heparin now   Obesity (BMI 30-39.9): Patient has Obesity Class  I, with body weight 83 Kg and BMI 31.41  kg/m2.  - Encourage losing weight - Exercise and healthy diet    DVT ppx: on IV Heparin      Code Status: Full code    Family Communication:     not done, no family member is at bed side.       Disposition Plan:  Anticipate discharge back to previous environment   Consults called:  Dr. Jinny of GI is consulted by EDP. Also consulted Dr. Jacobo of oncology   Admission status and Level of care: Telemetry Medical:   as inpt           Dispo: The patient is from: Home   Subjective:  Patient seen and examined at bedside this morning Still has some abdominal pain Gastroenterologist planning ERCP with stent placement today Denies nausea vomiting  Physical Exam:  General: Not in acute distress HEENT:       Eyes: PERRL, EOMI,  ENT: No discharge from the ears and nose, no pharynx injection, no tonsillar enlargement.        Neck: No JVD, no bruit, no mass felt. Heme: No neck lymph node enlargement. Cardiac: S1/S2, RRR, No murmurs, No gallops or rubs. Respiratory: No rales, wheezing, rhonchi or rubs. GI: Soft, nondistended, nontender, no rebound pain, no organomegaly, BS present. GU: No hematuria Ext: No pitting leg edema bilaterally. 1+DP/PT pulse  bilaterally. Musculoskeletal: No joint deformities, No joint redness or warmth, no limitation of ROM in spin. Skin: No rashes.  Neuro: Alert, oriented X3, cranial nerves II-XII grossly intact, moves all extremities normally.  Psych: Patient is not psychotic, no suicidal or hemocidal ideation.    Vitals:   08/05/23 1225 08/05/23 1230 08/05/23 1235 08/05/23 1240  BP: (!) 115/52  (!) 113/55   Pulse: (!) 59 (!) 54 (!) 54 (!) 57  Resp: 18 15 18 15   Temp:      TempSrc:      SpO2: 98% 99% 98% 98%  Weight:      Height:        Data Reviewed: Abdominal imaging with findings as noted above    Latest Ref Rng & Units 08/05/2023    3:48 AM 08/04/2023    4:08 PM 08/01/2023   10:45 AM  CBC  WBC 4.0 - 10.5 K/uL 6.8  7.1  6.1   Hemoglobin 12.0 - 15.0 g/dL 87.1  86.9  86.5   Hematocrit 36.0 - 46.0 % 39.0  38.0  40.2   Platelets 150 - 400 K/uL 314  324  343        Latest Ref Rng & Units 08/05/2023    3:48 AM 08/04/2023    4:08 PM 08/03/2023   12:03 PM  BMP  Glucose 70 - 99 mg/dL 883  864  882   BUN 8 - 23 mg/dL 15  18  13    Creatinine 0.44 - 1.00 mg/dL 9.66  8.97  9.11   BUN/Creat Ratio 6 - 22 (calc)   SEE NOTE:   Sodium 135 - 145 mmol/L 137  134  135   Potassium 3.5 - 5.1 mmol/L 3.3  3.4  4.1   Chloride 98 - 111 mmol/L 100  97  96   CO2 22 - 32 mmol/L 28  25  28    Calcium  8.9 - 10.3 mg/dL 9.4  9.6  89.4     Time spent: 52 minutes  Author: Drue ONEIDA Potter, MD 08/05/2023 4:37 PM  For on call review [email or web address].

## 2023-08-05 NOTE — Progress Notes (Signed)
 Patient is alert and oriented X 4. Pt refused to locked up her belonging(purse, money and personal documents.) by security.

## 2023-08-05 NOTE — Transfer of Care (Signed)
 Immediate Anesthesia Transfer of Care Note  Patient: Colleen [name]  Procedure(s) Performed: ERCP, WITH INTERVENTION IF INDICATED  Patient Location: PACU  Anesthesia Type:General  Level of Consciousness: sedated  Airway & Oxygen Therapy: Patient Spontanous Breathing  Post-op Assessment: Report given to RN and Post -op Vital signs reviewed and stable  Post vital signs: Reviewed and stable  Last Vitals:  Vitals Value Taken Time  BP 115/97 08/05/23 11:45  Temp    Pulse 80 08/05/23 11:45  Resp 14 08/05/23 11:47  SpO2 94 % 08/05/23 11:45  Vitals shown include unfiled device data.  Last Pain:  Vitals:   08/05/23 1145  TempSrc: Temporal  PainSc: 0-No pain         Complications: No notable events documented.

## 2023-08-06 DIAGNOSIS — K8689 Other specified diseases of pancreas: Secondary | ICD-10-CM | POA: Diagnosis not present

## 2023-08-06 LAB — COMPREHENSIVE METABOLIC PANEL WITH GFR
ALT: 129 U/L — ABNORMAL HIGH (ref 0–44)
AST: 103 U/L — ABNORMAL HIGH (ref 15–41)
Albumin: 3.3 g/dL — ABNORMAL LOW (ref 3.5–5.0)
Alkaline Phosphatase: 893 U/L — ABNORMAL HIGH (ref 38–126)
Anion gap: 7 (ref 5–15)
BUN: 13 mg/dL (ref 8–23)
CO2: 28 mmol/L (ref 22–32)
Calcium: 9.5 mg/dL (ref 8.9–10.3)
Chloride: 103 mmol/L (ref 98–111)
Creatinine, Ser: 0.8 mg/dL (ref 0.44–1.00)
GFR, Estimated: >60 mL/min (ref >60)
Glucose, Bld: 116 mg/dL — ABNORMAL HIGH (ref 70–99)
Potassium: 4 mmol/L (ref 3.5–5.1)
Sodium: 138 mmol/L (ref 135–145)
Total Bilirubin: 3.1 mg/dL — ABNORMAL HIGH (ref 0.0–1.2)
Total Protein: 6.9 g/dL (ref 6.5–8.1)

## 2023-08-06 LAB — GLUCOSE, CAPILLARY: Glucose-Capillary: 91 mg/dL (ref 70–99)

## 2023-08-06 NOTE — Progress Notes (Addendum)
 Progress Note   Patient: Colleen [name] FMW:[id number] DOB: [birth year] DOA: 08/04/2023     2 DOS: the patient was seen and examined on 08/06/2023     Brief hospital course: Colleen [name] is a 78 y.o. female with medical history significant of hypertension, hyperlipidemia, prediabetes, COPD/asthma, gout, GERD, CKD-3, A-fib and PE on Eliquis, lumbar spinal stenosis, who presents with abnormal lab work, dark urine and lighter stool.   Patient states that in the past 2 weeks, she noticed her urine is dark and her stool is lighter in color.  She had a regular visit with PCP on 7/7 who did some lab and found that her liver function was abnormal.  Then right upper quadrant ultrasound was ordered, which showed dilated common bile duct today.  Patient was called  by her PCP and instructed to come to the hospital for further evaluation and treatment.  Patient denies nausea, vomiting, diarrhea or abdominal pain.  No fever or chills.  No chest pain, cough, SOB.  No symptoms of UTI.  Chart review revealed that patient had negative hepatitis panel on 08/03/2023.     Assessment and Plan:  Pancreatic mass:  MRCP showed pancreatic lesion causing common bile duct and pancreatic ductal dilation, consistent with pancreatic adenocarcinoma, no signs of metastatic disease yet. Gastroenterologist on board and patient underwent ERCP with stent placement on 08/05/2023 Oncologist Dr. Jacobo on board and case discussed Oncology would like to follow patient after discharge Follow-up CA-19-9 Holding anticoagulation post ERCP as GI is concerned about watching for possible bleeding IR planning PTC on Monday We will continue to hold Eliquis We will do SCD for DVT prophylaxis for now Need to keep patient n.p.o. after midnight of Sunday   Abnormal LFTs: Secondary to pancreatic mass compression effects. - Hold lovastatin  temporarily Monitor LFTs closely   Benign essential HTN -Continue enalapril   - IV hydralazine  as  needed   HLD (hyperlipidemia) -Hold lovastatin  due to abnormal liver function   COPD (chronic obstructive pulmonary disease) (HCC): Stable -Bronchodilators and as needed Mucinex    Gout - Continue allopurinol    Prediabetes: Recent A1c 5.7, well-controlled.  Patient not taking medications currently. -Check CBG every morning   Hypokalemia: Continue position and monitoring  Chronic kidney disease, stage 3a (HCC): Renal function is close to baseline.  Recent baseline creatinine 0.8-1.0, her creatinine is 1.02, BUN 18, GFR 57 -Follow-up with BMP   Atrial fibrillation, chronic (HCC): Heart rate 80s - Holding anticoagulation given planned IR procedure on Monday   Pulmonary embolism (HCC) -IV heparin now   Obesity (BMI 30-39.9): Patient has Obesity Class  I, with body weight 83 Kg and BMI 31.41  kg/m2.  - Encourage losing weight - Exercise and healthy diet     DVT ppx: SCD   Code Status: Full code    Family Communication:     not done, no family member is at bed side.       Disposition Plan:  Anticipate discharge back to previous environment   Consults called:  Dr. Jinny of GI and Dr. Jacobo of oncology   Admission status and Level of care: Telemetry Medical:   as inpt       Dispo: The patient is from: Home     Subjective:  Patient seen and examined at bedside this morning Still has some abdominal pain Gastroenterologist planning ERCP with stent placement today Denies nausea vomiting   Physical Exam:   General: Not in acute distress Heme: No neck lymph node enlargement. Cardiac:  S1/S2, RRR, No murmurs, No gallops or rubs. Respiratory: No rales, wheezing, rhonchi or rubs. GI: Soft, nondistended, nontender, no rebound pain GU: No hematuria Ext: No pitting leg edema bilaterally. 1+DP/PT pulse bilaterally. Musculoskeletal: No joint deformities, No joint redness or warmth, no limitation of ROM in spin. Skin: No rashes.  Neuro: Alert, oriented X3, cranial nerves  II-XII grossly intact Psych: Patient is not psychotic, no suicidal or hemocidal ideation.   Data Reviewed: Abdominal imaging with findings as noted above    Latest Ref Rng & Units 08/05/2023    3:48 AM 08/04/2023    4:08 PM 08/01/2023   10:45 AM  CBC  WBC 4.0 - 10.5 K/uL 6.8  7.1  6.1   Hemoglobin 12.0 - 15.0 g/dL 87.1  86.9  86.5   Hematocrit 36.0 - 46.0 % 39.0  38.0  40.2   Platelets 150 - 400 K/uL 314  324  343        Latest Ref Rng & Units 08/06/2023    6:16 AM 08/05/2023    3:48 AM 08/04/2023    4:08 PM  BMP  Glucose 70 - 99 mg/dL 883  883  864   BUN 8 - 23 mg/dL 13  15  18    Creatinine 0.44 - 1.00 mg/dL 9.19  9.66  8.97   Sodium 135 - 145 mmol/L 138  137  134   Potassium 3.5 - 5.1 mmol/L 4.0  3.3  3.4   Chloride 98 - 111 mmol/L 103  100  97   CO2 22 - 32 mmol/L 28  28  25    Calcium  8.9 - 10.3 mg/dL 9.5  9.4  9.6      Vitals:   08/05/23 2004 08/06/23 0014 08/06/23 0349 08/06/23 0910  BP: 105/62 109/72 (!) 113/58 (!) 123/59  Pulse: 72 62 63 73  Resp:  15 18 18   Temp: (!) 97.5 F (36.4 C) (!) 97.4 F (36.3 C) 97.6 F (36.4 C) 97.8 F (36.6 C)  TempSrc:      SpO2: 99% 99% 99% 100%  Weight:      Height:        Author: Drue ONEIDA Potter, MD 08/06/2023 11:56 AM  For on call review [email or web address].

## 2023-08-06 NOTE — Plan of Care (Signed)

## 2023-08-06 NOTE — Plan of Care (Signed)
   Problem: Education: Goal: Knowledge of General Education information will improve Description: Including pain rating scale, medication(s)/side effects and non-pharmacologic comfort measures Outcome: Progressing   Problem: Health Behavior/Discharge Planning: Goal: Ability to manage health-related needs will improve Outcome: Progressing   Problem: Clinical Measurements: Goal: Ability to maintain clinical measurements within normal limits will improve Outcome: Progressing Goal: Will remain free from infection Outcome: Progressing Goal: Diagnostic test results will improve Outcome: Progressing Goal: Respiratory complications will improve Outcome: Progressing Goal: Cardiovascular complication will be avoided Outcome: Progressing   Problem: Coping: Goal: Level of anxiety will decrease Outcome: Progressing   Problem: Elimination: Goal: Will not experience complications related to bowel motility Outcome: Progressing Goal: Will not experience complications related to urinary retention Outcome: Progressing   Problem: Skin Integrity: Goal: Risk for impaired skin integrity will decrease Outcome: Progressing

## 2023-08-07 DIAGNOSIS — K8689 Other specified diseases of pancreas: Secondary | ICD-10-CM | POA: Diagnosis not present

## 2023-08-07 LAB — COMPREHENSIVE METABOLIC PANEL WITH GFR
ALT: 102 U/L — ABNORMAL HIGH (ref 0–44)
AST: 75 U/L — ABNORMAL HIGH (ref 15–41)
Albumin: 3.1 g/dL — ABNORMAL LOW (ref 3.5–5.0)
Alkaline Phosphatase: 768 U/L — ABNORMAL HIGH (ref 38–126)
Anion gap: 6 (ref 5–15)
BUN: 10 mg/dL (ref 8–23)
CO2: 24 mmol/L (ref 22–32)
Calcium: 9.3 mg/dL (ref 8.9–10.3)
Chloride: 106 mmol/L (ref 98–111)
Creatinine, Ser: 0.74 mg/dL (ref 0.44–1.00)
GFR, Estimated: >60 mL/min (ref >60)
Glucose, Bld: 108 mg/dL — ABNORMAL HIGH (ref 70–99)
Potassium: 3.9 mmol/L (ref 3.5–5.1)
Sodium: 136 mmol/L (ref 135–145)
Total Bilirubin: 2.6 mg/dL — ABNORMAL HIGH (ref 0.0–1.2)
Total Protein: 6.8 g/dL (ref 6.5–8.1)

## 2023-08-07 LAB — GLUCOSE, CAPILLARY
Glucose-Capillary: 101 mg/dL — ABNORMAL HIGH (ref 70–99)
Glucose-Capillary: 95 mg/dL (ref 70–99)
Glucose-Capillary: 111 mg/dL — ABNORMAL HIGH (ref 70–99)

## 2023-08-07 NOTE — Progress Notes (Signed)
 Progress Note   Patient: Colleen [name] FMW:[id number] DOB: 01-20-1946 DOA: 08/04/2023     3 DOS: the patient was seen and examined on 08/07/2023    Brief hospital course: From HPI Colleen [name] is a 78 y.o. female with medical history significant of hypertension, hyperlipidemia, prediabetes, COPD/asthma, gout, GERD, CKD-3, A-fib and PE on Eliquis, lumbar spinal stenosis, who presents with abnormal lab work, dark urine and lighter stool.   Patient states that in the past 2 weeks, she noticed her urine is dark and her stool is lighter in color.  She had a regular visit with PCP on 7/7 who did some lab and found that her liver function was abnormal.  Then right upper quadrant ultrasound was ordered, which showed dilated common bile duct today.  Patient was called  by her PCP and instructed to come to the hospital for further evaluation and treatment.  Patient denies nausea, vomiting, diarrhea or abdominal pain.  No fever or chills.  No chest pain, cough, SOB.  No symptoms of UTI.  Chart review revealed that patient had negative hepatitis panel on 08/03/2023.       Assessment and Plan:  Pancreatic mass:  MRCP showed pancreatic lesion causing common bile duct and pancreatic ductal dilation, consistent with pancreatic adenocarcinoma, no signs of metastatic disease yet. Gastroenterologist on board and patient underwent ERCP with stent placement on 08/05/2023 Oncologist Dr. Jacobo on board and case discussed Oncology would like to follow patient after discharge Follow-up CA-19-9 Holding anticoagulation post ERCP as GI is concerned about watching for possible bleeding IR planning PTC on Monday We will continue to hold Eliquis We will do SCD for DVT prophylaxis for now Need to keep patient n.p.o. after midnight of Sunday   Abnormal LFTs: Secondary to pancreatic mass compression effects. - Hold lovastatin  temporarily Monitor LFTs closely   Benign essential HTN -Continue enalapril   - IV  hydralazine  as needed   HLD (hyperlipidemia) -Hold lovastatin  due to abnormal liver function   COPD (chronic obstructive pulmonary disease) (HCC): Stable -Bronchodilators and as needed Mucinex    Gout - Continue allopurinol    Prediabetes: Recent A1c 5.7, well-controlled.  Patient not taking medications currently. -Check CBG every morning   Hypokalemia: Continue position and monitoring   Chronic kidney disease, stage 3a (HCC): Renal function is close to baseline.  Recent baseline creatinine 0.8-1.0, her creatinine is 1.02, BUN 18, GFR 57 -Follow-up with BMP   Atrial fibrillation, chronic (HCC): Heart rate 80s - Holding anticoagulation given planned IR procedure on Monday   Pulmonary embolism (HCC) -IV heparin now   Obesity (BMI 30-39.9): Patient has Obesity Class  I, with body weight 83 Kg and BMI 31.41  kg/m2.  - Encourage losing weight - Exercise and healthy diet     DVT ppx: SCD   Code Status: Full code    Family Communication:     not done, no family member is at bed side.       Disposition Plan:  Anticipate discharge back to previous environment   Consults called:  Dr. Jinny of GI and Dr. Jacobo of oncology   Admission status and Level of care: Telemetry Medical:   as inpt       Dispo: The patient is from: Home     Subjective:  Patient seen and examined at bedside this morning Still has some abdominal pain Patient being planned for PTC by IR tomorrow Diet advanced today   Physical Exam:   General: Not in acute distress Heme: No  neck lymph node enlargement. Cardiac: S1/S2, RRR, No murmurs, No gallops or rubs. Respiratory: No rales, wheezing, rhonchi or rubs. GI: Soft, nondistended, nontender, no rebound pain GU: No hematuria Ext: No pitting leg edema bilaterally. 1+DP/PT pulse bilaterally. Musculoskeletal: No joint deformities, No joint redness or warmth, no limitation of ROM in spin. Skin: No rashes.  Neuro: Alert, oriented X3, cranial nerves II-XII  grossly intact Psych: Patient is not psychotic, no suicidal or hemocidal ideation.   Data Reviewed: Abdominal imaging with findings as noted above   Vitals:   08/06/23 0910 08/06/23 1547 08/06/23 2047 08/07/23 0539  BP: (!) 123/59 116/68 107/66 (!) 116/52  Pulse: 73 75 65 80  Resp: 18 17    Temp: 97.8 F (36.6 C) 98 F (36.7 C) 97.8 F (36.6 C) (!) 97.5 F (36.4 C)  TempSrc:   Oral Oral  SpO2: 100% 98% 95% 98%  Weight:      Height:          Latest Ref Rng & Units 08/05/2023    3:48 AM 08/04/2023    4:08 PM 08/01/2023   10:45 AM  CBC  WBC 4.0 - 10.5 K/uL 6.8  7.1  6.1   Hemoglobin 12.0 - 15.0 g/dL 87.1  86.9  86.5   Hematocrit 36.0 - 46.0 % 39.0  38.0  40.2   Platelets 150 - 400 K/uL 314  324  343        Latest Ref Rng & Units 08/07/2023    3:21 AM 08/06/2023    6:16 AM 08/05/2023    3:48 AM  BMP  Glucose 70 - 99 mg/dL 891  883  883   BUN 8 - 23 mg/dL 10  13  15    Creatinine 0.44 - 1.00 mg/dL 9.25  9.19  9.66   Sodium 135 - 145 mmol/L 136  138  137   Potassium 3.5 - 5.1 mmol/L 3.9  4.0  3.3   Chloride 98 - 111 mmol/L 106  103  100   CO2 22 - 32 mmol/L 24  28  28    Calcium  8.9 - 10.3 mg/dL 9.3  9.5  9.4      Author: Drue ONEIDA Potter, MD 08/07/2023 4:28 PM  For on call review [email or web address].

## 2023-08-07 NOTE — Plan of Care (Signed)

## 2023-08-08 ENCOUNTER — Inpatient Hospital Stay: Admitting: Radiology

## 2023-08-08 ENCOUNTER — Ambulatory Visit

## 2023-08-08 DIAGNOSIS — K8689 Other specified diseases of pancreas: Secondary | ICD-10-CM | POA: Diagnosis not present

## 2023-08-08 HISTORY — PX: IR INT EXT BILIARY DRAIN WITH CHOLANGIOGRAM: IMG6044

## 2023-08-08 LAB — COMPREHENSIVE METABOLIC PANEL WITH GFR
ALT: 105 U/L — ABNORMAL HIGH (ref 0–44)
AST: 87 U/L — ABNORMAL HIGH (ref 15–41)
Albumin: 3.3 g/dL — ABNORMAL LOW (ref 3.5–5.0)
Alkaline Phosphatase: 765 U/L — ABNORMAL HIGH (ref 38–126)
Anion gap: 8 (ref 5–15)
BUN: 13 mg/dL (ref 8–23)
CO2: 26 mmol/L (ref 22–32)
Calcium: 9.4 mg/dL (ref 8.9–10.3)
Chloride: 103 mmol/L (ref 98–111)
Creatinine, Ser: 0.66 mg/dL (ref 0.44–1.00)
GFR, Estimated: >60 mL/min (ref >60)
Glucose, Bld: 117 mg/dL — ABNORMAL HIGH (ref 70–99)
Potassium: 3.5 mmol/L (ref 3.5–5.1)
Sodium: 137 mmol/L (ref 135–145)
Total Bilirubin: 3.4 mg/dL — ABNORMAL HIGH (ref 0.0–1.2)
Total Protein: 7.4 g/dL (ref 6.5–8.1)

## 2023-08-08 LAB — CBC
HCT: 37.9 % (ref 36.0–46.0)
Hemoglobin: 12.7 g/dL (ref 12.0–15.0)
MCH: 31.1 pg (ref 26.0–34.0)
MCHC: 33.5 g/dL (ref 30.0–36.0)
MCV: 92.7 fL (ref 80.0–100.0)
Platelets: 323 K/uL (ref 150–400)
RBC: 4.09 MIL/uL (ref 3.87–5.11)
RDW: 14.1 % (ref 11.5–15.5)
WBC: 6.7 K/uL (ref 4.0–10.5)
nRBC: 0 % (ref 0.0–0.2)

## 2023-08-08 LAB — GLUCOSE, CAPILLARY: Glucose-Capillary: 111 mg/dL — ABNORMAL HIGH (ref 70–99)

## 2023-08-08 LAB — PROTIME-INR
INR: 1.1 (ref 0.8–1.2)
Prothrombin Time: 14.4 s (ref 11.4–15.2)

## 2023-08-08 LAB — CANCER ANTIGEN 19-9: CA 19-9: 7122 U/mL — ABNORMAL HIGH (ref 0–35)

## 2023-08-08 MED ORDER — IOHEXOL 300 MG/ML  SOLN
50.0000 mL | Freq: Once | INTRAMUSCULAR | Status: AC | PRN
  Administered 2023-08-08: 50 mL

## 2023-08-08 MED ORDER — ONDANSETRON HCL 4 MG/2ML IJ SOLN
INTRAMUSCULAR | Status: AC | PRN
  Administered 2023-08-08: 4 mg via INTRAVENOUS

## 2023-08-08 MED ORDER — FENTANYL CITRATE (PF) 100 MCG/2ML IJ SOLN
INTRAMUSCULAR | Status: AC
  Filled 2023-08-08: qty 2

## 2023-08-08 MED ORDER — MIDAZOLAM HCL 2 MG/2ML IJ SOLN
INTRAMUSCULAR | Status: AC
  Filled 2023-08-08: qty 2

## 2023-08-08 MED ORDER — LIDOCAINE HCL 1 % IJ SOLN
10.0000 mL | Freq: Once | INTRAMUSCULAR | Status: DC
  Filled 2023-08-08: qty 10

## 2023-08-08 MED ORDER — FENTANYL CITRATE (PF) 100 MCG/2ML IJ SOLN
INTRAMUSCULAR | Status: AC | PRN
  Administered 2023-08-08 (×5): 25 ug via INTRAVENOUS
  Administered 2023-08-08: 50 ug via INTRAVENOUS
  Administered 2023-08-08: 25 ug via INTRAVENOUS

## 2023-08-08 MED ORDER — CEFAZOLIN SODIUM-DEXTROSE 1-4 GM/50ML-% IV SOLN
INTRAVENOUS | Status: AC
Start: 2023-08-08 — End: 2023-08-08
  Filled 2023-08-08: qty 50

## 2023-08-08 MED ORDER — ONDANSETRON HCL 4 MG/2ML IJ SOLN
INTRAMUSCULAR | Status: AC
Start: 2023-08-08 — End: 2023-08-08
  Filled 2023-08-08: qty 2

## 2023-08-08 MED ORDER — LIDOCAINE HCL 1 % IJ SOLN
INTRAMUSCULAR | Status: AC
  Filled 2023-08-08: qty 20

## 2023-08-08 MED ORDER — CEFAZOLIN SODIUM-DEXTROSE 2-4 GM/100ML-% IV SOLN
INTRAVENOUS | Status: AC | PRN
  Administered 2023-08-08: 1 g via INTRAVENOUS

## 2023-08-08 MED ORDER — MIDAZOLAM HCL 5 MG/5ML IJ SOLN
INTRAMUSCULAR | Status: AC | PRN
Start: 2023-08-08 — End: 2023-08-08
  Administered 2023-08-08: .5 mg via INTRAVENOUS
  Administered 2023-08-08: 1 mg via INTRAVENOUS
  Administered 2023-08-08: .5 mg via INTRAVENOUS

## 2023-08-08 NOTE — Progress Notes (Signed)
 Mobility Specialist - Progress Note   08/08/23 1204  Mobility  Activity Ambulated with assistance in hallway  Level of Assistance Standby assist, set-up cues, supervision of patient - no hands on  Assistive Device Front wheel walker  Distance Ambulated (ft) 160 ft  Activity Response Tolerated well  Mobility visit 1 Mobility  Mobility Specialist Start Time (ACUTE ONLY) 1138  Mobility Specialist Stop Time (ACUTE ONLY) 1148  Mobility Specialist Time Calculation (min) (ACUTE ONLY) 10 min   Pt supine upon entry, utilizing RA. Pt agreeable to OOB amb this date. Pt completed bed mob ModI, STS to RW and dons gown with SBA. Pt amb one lap around the NS with close supervision, tolerated well. Pt returned to the room, denied pain or SOB. Pt left supine with alarm set and needs within reach.  America Silvan Mobility Specialist 08/08/23 12:06 PM

## 2023-08-08 NOTE — Care Management Important Message (Signed)
 Important Message  Patient Details  Name: Colleen [name] MRN: [id number] Date of Birth: 18-Feb-1945   Important Message Given:  Yes - Medicare IM     Dyamond Tolosa W, CMA 08/08/2023, 12:23 PM

## 2023-08-08 NOTE — Progress Notes (Signed)
 Progress Note   Patient: Colleen [name] DOB: 09/13/1945 DOA: 08/04/2023     4 DOS: the patient was seen and examined on 08/08/2023     Brief hospital course: From HPI Colleen [name] is a 78 y.o. female with medical history significant of hypertension, hyperlipidemia, prediabetes, COPD/asthma, gout, GERD, CKD-3, A-fib and PE on Eliquis, lumbar spinal stenosis, who presents with abnormal lab work, dark urine and lighter stool.   Patient states that in the past 2 weeks, she noticed her urine is dark and her stool is lighter in color.  She had a regular visit with PCP on 7/7 who did some lab and found that her liver function was abnormal.  Then right upper quadrant ultrasound was ordered, which showed dilated common bile duct today.  Patient was called  by her PCP and instructed to come to the hospital for further evaluation and treatment.  Patient denies nausea, vomiting, diarrhea or abdominal pain.  No fever or chills.  No chest pain, cough, SOB.  No symptoms of UTI.  Chart review revealed that patient had negative hepatitis panel on 08/03/2023.       Assessment and Plan:  Pancreatic mass:  MRCP showed pancreatic lesion causing common bile duct and pancreatic ductal dilation, consistent with pancreatic adenocarcinoma, no signs of metastatic disease yet. Gastroenterologist on board and patient underwent ERCP with stent placement on 08/05/2023 Oncologist Dr. Jacobo on board and case discussed Oncology would like to follow patient after discharge Follow-up CA-19-9 Holding anticoagulation post ERCP as GI is concerned about watching for possible bleeding IR planning PTC today We will continue to hold Eliquis We will do SCD for DVT prophylaxis for now    Abnormal LFTs: Secondary to pancreatic mass compression effects. - Hold lovastatin  temporarily Monitor LFTs closely   Benign essential HTN -Continue enalapril   - IV hydralazine  as needed   HLD (hyperlipidemia) -Hold  lovastatin  due to abnormal liver function   COPD (chronic obstructive pulmonary disease) (HCC): Stable -Bronchodilators and as needed Mucinex    Gout - Continue allopurinol    Prediabetes: Recent A1c 5.7, well-controlled.  Patient not taking medications currently. Monitor glucose level   Hypokalemia: Continue position and monitoring   Chronic kidney disease, stage 3a (HCC): Renal function is close to baseline.  Recent baseline creatinine 0.8-1.0, her creatinine is 1.02, BUN 18, GFR 57 Creatinine at baseline   Atrial fibrillation, chronic (HCC): Heart rate 80s - Holding anticoagulation given planned IR procedure on Monday   Pulmonary embolism (HCC) -IV heparin now   Obesity (BMI 30-39.9): Patient has Obesity Class  I, with body weight 83 Kg and BMI 31.41  kg/m2.  - Encourage losing weight - Exercise and healthy diet     DVT ppx: SCD   Code Status: Full code    Family Communication:     not done, no family member is at bed side.       Disposition Plan:  Anticipate discharge back to previous environment   Consults called:  Dr. Jinny of GI and Dr. Jacobo of oncology   Admission status and Level of care: Telemetry Medical:   as inpt       Dispo: The patient is from: Home     Subjective:  Patient seen and examined at bedside this morning Patient being planned for PTC today by IR Still continues to be n.p.o.   Physical Exam:   General: Not in acute distress Heme: No neck lymph node enlargement. Cardiac: S1/S2, RRR, No murmurs, No gallops or  rubs. Respiratory: No rales, wheezing, rhonchi or rubs. GI: Soft, nondistended, nontender, no rebound pain GU: No hematuria Ext: No pitting leg edema bilaterally. 1+DP/PT pulse bilaterally. Musculoskeletal: No joint deformities, No joint redness or warmth, no limitation of ROM in spin. Skin: No rashes.  Neuro: Alert, oriented X3, cranial nerves II-XII grossly intact Psych: Patient is not psychotic, no suicidal or hemocidal  ideation.   Data Reviewed: Abdominal imaging with findings as noted above    Vitals:   08/07/23 1640 08/07/23 2048 08/08/23 0426 08/08/23 0917  BP: 123/71 (!) 115/58 (!) 120/59 133/72  Pulse: 68 74 61 68  Resp:    18  Temp: 97.9 F (36.6 C) 97.8 F (36.6 C) 97.8 F (36.6 C) 98 F (36.7 C)  TempSrc:  Oral Oral   SpO2: 100% 94% 97% 99%  Weight:      Height:          Latest Ref Rng & Units 08/08/2023    5:59 AM 08/05/2023    3:48 AM 08/04/2023    4:08 PM  CBC  WBC 4.0 - 10.5 K/uL 6.7  6.8  7.1   Hemoglobin 12.0 - 15.0 g/dL 87.2  87.1  86.9   Hematocrit 36.0 - 46.0 % 37.9  39.0  38.0   Platelets 150 - 400 K/uL 323  314  324        Latest Ref Rng & Units 08/08/2023    5:59 AM 08/07/2023    3:21 AM 08/06/2023    6:16 AM  BMP  Glucose 70 - 99 mg/dL 882  891  883   BUN 8 - 23 mg/dL 13  10  13    Creatinine 0.44 - 1.00 mg/dL 9.33  9.25  9.19   Sodium 135 - 145 mmol/L 137  136  138   Potassium 3.5 - 5.1 mmol/L 3.5  3.9  4.0   Chloride 98 - 111 mmol/L 103  106  103   CO2 22 - 32 mmol/L 26  24  28    Calcium  8.9 - 10.3 mg/dL 9.4  9.3  9.5      Author: Drue ONEIDA Potter, MD 08/08/2023 11:15 AM  For on call review [email or web address].

## 2023-08-08 NOTE — Plan of Care (Signed)
  Problem: Health Behavior/Discharge Planning: Goal: Ability to manage health-related needs will improve Outcome: Progressing   Problem: Clinical Measurements: Goal: Ability to maintain clinical measurements within normal limits will improve Outcome: Progressing   Problem: Activity: Goal: Risk for activity intolerance will decrease Outcome: Progressing   Problem: Coping: Goal: Level of anxiety will decrease Outcome: Progressing   Problem: Elimination: Goal: Will not experience complications related to bowel motility Outcome: Progressing Goal: Will not experience complications related to urinary retention Outcome: Progressing

## 2023-08-08 NOTE — Procedures (Signed)
 Vascular and Interventional Radiology Procedure Note  Patient: Colleen [name] DOB: 01-18-1946 Medical Record Number: [id number] Note Date/Time: 08/08/23 1:55 PM   Performing Physician: Thom Hall, MD Assistant(s): None  Diagnosis: CBD compression, c/f pancreatic mass   Procedure:  PERCUTANEOUS TRANSHEPATIC BILIARY DRAIN PLACEMENT ANTEROGRADE CHOLANGIOGRAM BILIARY BRUSH BIOPSY of CBD STRICTURE  Anesthesia: Conscious Sedation Complications: None Estimated Blood Loss: Minimal Specimens:  Cytology  Findings:  Successful placement of 19F PTBD tube. Brush Bx of the distal CBD stricture and diagnostic aspiration of bile   Plan: Flush tube w 5 mL sterile NS q8h and record drain output qShift. Follow up for routine tube evaluation in 6-8 week(s).   See detailed procedure note with images in PACS. The patient tolerated the procedure well without incident or complication and was returned to Recovery in stable condition.    Thom Hall, MD [facility]   Pager. [phone] Clinic. [phone]

## 2023-08-09 ENCOUNTER — Other Ambulatory Visit: Payer: Self-pay | Admitting: *Deleted

## 2023-08-09 ENCOUNTER — Ambulatory Visit: Admitting: Physical Therapy

## 2023-08-09 ENCOUNTER — Other Ambulatory Visit: Payer: Self-pay

## 2023-08-09 DIAGNOSIS — K8689 Other specified diseases of pancreas: Secondary | ICD-10-CM | POA: Diagnosis not present

## 2023-08-09 LAB — CBC
HCT: 42.7 % (ref 36.0–46.0)
Hemoglobin: 14.2 g/dL (ref 12.0–15.0)
MCH: 30.2 pg (ref 26.0–34.0)
MCHC: 33.3 g/dL (ref 30.0–36.0)
MCV: 90.9 fL (ref 80.0–100.0)
Platelets: 360 K/uL (ref 150–400)
RBC: 4.7 MIL/uL (ref 3.87–5.11)
RDW: 13.8 % (ref 11.5–15.5)
WBC: 10.3 K/uL (ref 4.0–10.5)
nRBC: 0 % (ref 0.0–0.2)

## 2023-08-09 LAB — GLUCOSE, CAPILLARY: Glucose-Capillary: 90 mg/dL (ref 70–99)

## 2023-08-09 MED ORDER — ONDANSETRON 4 MG PO TBDP
4.0000 mg | ORAL_TABLET | Freq: Three times a day (TID) | ORAL | 0 refills | Status: DC | PRN
  Filled 2023-08-09: qty 20, 7d supply, fill #0

## 2023-08-09 MED ORDER — NORMAL SALINE FLUSH 0.9 % IV SOLN
10.0000 mL | Freq: Every day | INTRAVENOUS | 1 refills | Status: AC
  Filled 2023-08-09: qty 300, 30d supply, fill #0

## 2023-08-09 NOTE — TOC Initial Note (Signed)
 Transition of Care [facility]) - Initial/Assessment Note    Patient Details  Name: Colleen [name] MRN: [id number] Date of Birth: 09/28/1945  Transition of Care [facility]) CM/SW Contact:    Dalia GORMAN Fuse, RN Phone Number: 08/09/2023, 1:19 PM  Clinical Narrative:                 Patient is from home independently. She doesn't drive, but her niece is supportive and takes her where she needs to go.  Her PCP is Dr. Olam Hake at [facility] and she uses [facility] on [address]. She has DME: walker, cane, and shower bench. She has had HH PT/OT in the past as well as outpatient rehab at [place].  TOC will continue to follow.    Barriers to Discharge: Continued Medical Work up   Patient Goals and CMS Choice            Expected Discharge Plan and Services   Discharge Planning Services: CM Consult   Living arrangements for the past 2 months: Single Family Home Expected Discharge Date: 08/09/23                                    Prior Living Arrangements/Services Living arrangements for the past 2 months: Single Family Home Lives with:: Self              Current home services: DME (walker, cane, shower bench)    Activities of Daily Living   ADL Screening (condition at time of admission) Independently performs ADLs?: Yes (appropriate for developmental age) Is the patient deaf or have difficulty hearing?: No Does the patient have difficulty seeing, even when wearing glasses/contacts?: No Does the patient have difficulty concentrating, remembering, or making decisions?: No  Permission Sought/Granted                  Emotional Assessment Appearance:: Appears stated age            Admission diagnosis:  Transaminitis [R74.01] Abnormal abdominal ultrasound [R93.5] Abnormal LFTs [R79.89] Patient Active Problem List   Diagnosis Date Noted   Abnormal LFTs 08/04/2023   Chronic kidney disease, stage 3a (HCC) 08/04/2023   Obesity (BMI 30-39.9)  08/04/2023   Atrial fibrillation, chronic (HCC) 08/04/2023   Pulmonary embolism (HCC) 08/04/2023   Hypokalemia 08/04/2023   Pancreatic mass 08/04/2023   New onset type 2 diabetes mellitus (HCC) 04/04/2023   Acquired trigger finger of right middle finger 07/24/2021   Acquired trigger finger of right ring finger 07/24/2021   Trigger finger of right hand 06/26/2021   Hidradenitis suppurativa 11/12/2020   Status post gastroplasty-1980s 06/25/2020   CLE (columnar lined esophagus)    Hiatal hernia    Gastric nodule    Angiodysplasia of stomach and duodenum    Cecal polyp    Polyp of descending colon    Polyp of transverse colon    Atrial fibrillation (HCC) 06/14/2019   Current use of anticoagulant therapy 06/14/2019   Iron deficiency anemia 06/14/2019   Essential (hemorrhagic) thrombocythemia (HCC) 06/14/2019   GERD (gastroesophageal reflux disease) 04/15/2019   Chronic low back pain 04/15/2019   Right pulmonary embolus (HCC) 04/15/2019   DOE (dyspnea on exertion) 03/13/2019   Atherosclerosis of both carotid arteries 09/20/2018   Spinal stenosis of lumbar region with neurogenic claudication 09/20/2018   Aortic atherosclerosis (HCC) 09/05/2018   At high risk for falls 09/05/2018   Osteopenia 05/18/2017  Stage 3 chronic kidney disease (HCC) 04/21/2017   Morbid obesity (HCC) 04/21/2017   Trigger middle finger of left hand 02/28/2017   Chronic gouty arthropathy without tophi 11/29/2016   Hypergammaglobulinemia 11/29/2016   Osteoarthritis of both knees 11/29/2016   Bilateral primary osteoarthritis of knee 11/29/2016   Allergic rhinitis, seasonal 07/30/2014   Gout 07/30/2014   HLD (hyperlipidemia) 07/30/2014   Osteoarthritis of knee 07/30/2014   Prediabetes 07/30/2014   Asthma, mild persistent 07/30/2014   COPD (chronic obstructive pulmonary disease) (HCC) 07/09/2008   Benign essential HTN 06/22/2006   PCP:  Leavy Mole, PA-C Pharmacy:   [facility] [address] ([address]),  [place] - [address] [place]) [place] [address] Phone: [phone] Fax: [phone]     Social Drivers of Health (SDOH) Social History: SDOH Screenings   Food Insecurity: No Food Insecurity (08/05/2023)  Housing: Unknown (08/05/2023)  Transportation Needs: No Transportation Needs (08/05/2023)  Utilities: Not At Risk (08/05/2023)  Alcohol Screen: Low Risk  (12/02/2022)  Depression (PHQ2-9): Low Risk  (12/02/2022)  Financial Resource Strain: Low Risk  (12/02/2022)  Physical Activity: Insufficiently Active (12/02/2022)  Social Connections: Socially Isolated (08/05/2023)  Stress: No Stress Concern Present (12/02/2022)  Tobacco Use: Medium Risk (08/04/2023)  Health Literacy: Adequate Health Literacy (12/02/2022)   SDOH Interventions:     Readmission Risk Interventions     No data to display

## 2023-08-09 NOTE — Discharge Instructions (Signed)
 Interventional Radiology Percutaneous Abscess Drain Placement After Care   This sheet gives you information about how to care for yourself after your procedure. Your health care provider may also give you more specific instructions. Your drain was placed by an interventional radiologist with [facility]. If you have questions or concerns, contact [facility] at [phone].   What is a percutaneous drain?   A drain is a small plastic tube (catheter) that goes into the fluid collection in your body through your skin.   How long will I need the drain?   How long the drain needs to stay in is determined by where the drain is, how much comes out of the drain each day and if you are having any other surgical procedures.   Interventional radiology will determine when it is time to remove the drain. It is important to follow up as directed so that the drain can be removed as soon as it is safe to do so.   What can I expect after the procedure?   After the procedure, it is common to have:   A small amount of bruising and discomfort in the area where the drainage tube (catheter) was placed.   Sleepiness and fatigue. This should go away after the medicines you were given have worn off.   Follow these instructions at home:   Insertion site care   Check your insertion site when you change the bandage. Check for:   More redness, swelling, or pain.   More fluid or blood.   Warmth.   Pus or a bad smell.   When caring for your insertion site:   Wash your hands with soap and water for at least 20 seconds before and after you change your bandage (dressing). If soap and water are not available, use hand sanitizer.   You do not need to change your dressing everyday if it is clean and dry. Change your dressing every 3 days or as needed when it is soiled, wet or becoming dislodged. You will need to change your dressing each time you shower.   Leave stitches (sutures), skin  glue, or adhesive strips in place. These skin closures may need to stay in place for 2 weeks or longer. If adhesive strip edges start to loosen and curl up, you may trim the loose edges. Do not remove adhesive strips completely unless your health care provider tells you to do so.   Catheter care   Flush the catheter once per day with 5 mL of 0.9% normal saline unless you are told otherwise by your healthcare provider. This helps to prevent clogs in the catheter.   To disconnect the drain, turn the clear plastic tube to the left. Attach the saline syringe by placing it on the white end of the drain and turning gently to the right. Once attached gently push the plunger to the 5 mL mark. After you are done flushing, disconnect the syringe by turning to the left and reattach your drainage container   If you have a bulb please be sure the bulb is charged after reconnecting it - to do this pinch the bulb between your thumb and first finger and close the stopper located on the top of the bulb.    Check for fluid leaking from around your catheter (instead of fluid draining through your catheter). This may be a sign that the drain is no longer working correctly.   Write down the following information every time you empty your  bag:   The date and time.   The amount of drainage.   Activity   Rest at home for 1-2 days after your procedure.   For the first 48 hours do not lift anything more than 10 lbs (about a gallon of milk). You may perform moderate activities/exercise. Please avoid strenuous activities during this time.   Avoid any activities which may pull on your drain as this can cause your drain to become dislodged.   If you were given a sedative during the procedure, it can affect you for several hours. Do not drive or operate machinery until your health care provider says that it is safe.   General instructions   For mild pain take over-the-counter medications as needed for pain such as  Tylenol  or Advil. If you are experiencing severe pain please call our office as this may indicate an issue with your drain.    If you were prescribed an antibiotic medicine, take it as told by your health care provider. Do not stop using the antibiotic even if you start to feel better.   You may shower 24 hours after the drain is placed. To do this cover the insertion site with a water tight material such as saran wrap and seal the edges with tape, you may also purchase waterproof dressings at your local drug store. Shower as usual and then remove the water tight dressing and any gauze/tape underneath it once you have exited the shower and dried off. Allow the area to air dry or pat dry with a clean towel. Once the skin is completely dry place a new gauze dressing. It is important to keep the site dry at all times to prevent infection.   Do not submerge the drain - this means you cannot take baths, swim, use a hot tub, etc. until the drain is removed.    Do not use any products that contain nicotine or tobacco, such as cigarettes, e-cigarettes, and chewing tobacco. If you need help quitting, ask your health care provider.   Keep all follow-up visits as told by your health care provider. This is important.   Contact a health care provider if:   You have less than 5 mL of drainage a day for 2-3 days in a row, or as directed by your health care provider.   You have any of these signs of infection:   More redness, swelling, or pain around your incision area.   More fluid or blood coming from your incision area.   Warmth coming from your incision area.   Pus or a bad smell coming from your incision area.   You have fluid leaking from around your catheter (instead of through your catheter).   You are unable to flush the drain.   You have a fever or chills.   You have pain that does not get better with medicine.   You have not been contacted to schedule a drain follow up appointment within  10 days of discharge from the hospital.   Please call [facility] at [phone] with any questions or concerns.   Get help right away if:   Your catheter comes out.   You suddenly stop having drainage from your catheter.   You suddenly have blood in the fluid that is draining from your catheter.   You become dizzy or you faint.   You develop a rash.   You have nausea or vomiting.   You have difficulty breathing or you feel short  of breath.   You develop chest pain.   You have problems with your speech or vision.   You have trouble balancing or moving your arms or legs.   Summary   It is common to have a small amount of bruising and discomfort in the area where the drainage tube (catheter) was placed. You may also have minor discomfort with movement while the drain is in place.   Flush the drain once per day with 5 mL of 0.9% normal saline (unless you were told otherwise by your healthcare provider).    Record the amount of drainage from the bag every time you empty it.   Change the dressing every 3 days or earlier if soiled/wet. Keep the skin dry under the dressing.   You may shower with the drain in place. Do not submerge the drain (no baths, swimming, hot tubs, etc.).   Contact [facility] at [phone] if you have more redness, swelling, or pain around your incision area or if you have pain that does not get better with medicine.   This information is not intended to replace advice given to you by your health care provider. Make sure you discuss any questions you have with your health care provider.   Document Revised: 04/16/2021 Document Reviewed: 01/06/2019   Elsevier Patient Education  2023 [employer]         Interventional Radiology Drain Record   Empty your drain at least once per day. You may empty it as often as needed. Use this form to write down the amount of fluid that has collected in the drainage container. Bring this  form with you to your follow-up visits. Please call [facility] at [phone] with any questions or concerns prior to your appointment.   Drain #1 location: ___________________   Date __________ Time __________ Amount __________   Date __________ Time __________ Amount __________   Date __________ Time __________ Amount __________   Date __________ Time __________ Amount __________   Date __________ Time __________ Amount __________   Date __________ Time __________ Amount __________   Date __________ Time __________ Amount __________   Date __________ Time __________ Amount __________   Date __________ Time __________ Amount __________   Date __________ Time __________ Amount __________   Date __________ Time __________ Amount __________   Date __________ Time __________ Amount __________   Date __________ Time __________ Amount __________   Date __________ Time __________ Amount __________

## 2023-08-09 NOTE — Discharge Summary (Signed)
 Physician Discharge Summary   Patient: Colleen [name] MRN: [id number] DOB: 23-Sep-1945  Admit date:     08/04/2023  Discharge date: 08/09/23  Discharge Physician: Drue ONEIDA Potter   PCP: Leavy Mole, PA-C   Recommendations at discharge:  Follow-up with oncology, IR  Discharge Diagnoses:  Pancreatic mass: Abnormal LFTs: Benign essential HTN HLD (hyperlipidemia) COPD (chronic obstructive pulmonary disease) (HCC):  Gout Prediabetes:  Hypokalemia: Chronic kidney disease, stage 3a (HCC) Atrial fibrillation, chronic (HCC):  Pulmonary embolism (HCC) Class I obesity (BMI 30-39.9) BMI 31.41  kg/m2.    Hospital Course: Colleen [name] is a 78 y.o. female with medical history significant of hypertension, hyperlipidemia, prediabetes, COPD/asthma, gout, GERD, CKD-3, A-fib and PE on Eliquis, lumbar spinal stenosis, who presents with abnormal lab work, dark urine and lighter stool.   Patient states that in the past 2 weeks before presentation, she noticed her urine is dark and her stool is lighter in color.  She had a regular visit with PCP on 7/7 who did some lab and found that her liver function was abnormal.  Then right upper quadrant ultrasound was ordered, which showed dilated common bile duct.  Patient was called  by her PCP and instructed to come to the hospital for further evaluation and treatment.  Patient denies nausea, vomiting, diarrhea or abdominal pain.  MRI of the abdomen showed hypoenhancing infiltrative lesion involving the lower pancreatic head accounting for the common bile duct and main pancreatic ductal dilatation findings consistent with pancreatic adenocarcinoma.  Patient was subsequently seen by oncologist with plans to follow-up as an outpatient.  She was also seen by GI and underwent ERCP. Patient was taken by IR on 08/08/2023 for percutaneous transhepatic biliary drain placement, anterograde cholangiogram as well as biliary brush biopsy of common bile duct stricture.  Patient  has now been cleared for discharge today and will follow-up as an outpatient   Consultants: GI, IR, oncology Procedures performed: Percutaneous cholangiogram, ERCP Disposition: Home Diet recommendation:  Cardiac diet DISCHARGE MEDICATION: Allergies as of 08/09/2023       Reactions   Nsaids    Other Reaction(s): Not available        Medication List     STOP taking these medications    lovastatin  40 MG tablet Commonly known as: MEVACOR        TAKE these medications    acetaminophen  650 MG CR tablet Commonly known as: TYLENOL  Take 650 mg by mouth every 8 (eight) hours as needed for pain.   Adult Blood Pressure Cuff Lg Kit 1 each by Does not apply route daily.   albuterol  108 (90 Base) MCG/ACT inhaler Commonly known as: VENTOLIN  HFA Inhale 2 puffs into the lungs every 4 (four) hours as needed for wheezing or shortness of breath.   allopurinol  100 MG tablet Commonly known as: ZYLOPRIM  Take 2 tablets (200 mg total) by mouth daily.   Breztri  Aerosphere 160-9-4.8 MCG/ACT Aero inhaler Generic drug: budesonide -glycopyrrolate -formoterol  Inhale 2 puffs into the lungs 2 (two) times daily.   cholecalciferol 25 MCG (1000 UNIT) tablet Commonly known as: VITAMIN D3 Take 1,000 Units by mouth daily.   Eliquis 2.5 MG Tabs tablet Generic drug: apixaban Take 2.5 mg by mouth 2 (two) times daily. What changed: Another medication with the same name was removed. Continue taking this medication, and follow the directions you see here.   enalapril  2.5 MG tablet Commonly known as: Vasotec  Take 1 tablet (2.5 mg total) by mouth daily.   famotidine  20 MG tablet Commonly known  as: PEPCID  Take 20 mg by mouth daily as needed for heartburn or indigestion.   hydrochlorothiazide  12.5 MG capsule Commonly known as: MICROZIDE  Take 1 capsule (12.5 mg total) by mouth daily.   MULTIVITAMIN WOMEN 50+ PO Take 1 tablet by mouth daily.   pantoprazole  40 MG tablet Commonly known as:  PROTONIX  Take 1 tablet (40 mg total) by mouth daily.   simethicone  80 MG chewable tablet Commonly known as: Gas-X Chew 1 tablet (80 mg total) by mouth 2 (two) times a day.   triamcinolone  cream 0.1 % Commonly known as: KENALOG  APPLY TO AFFECTED AREA TWICE DAILY IF NEEDED. NO MORE THAN 1 WEEK AT A TIME, THEN TAKE A BREAK FOR 2 WEEKS.        Follow-up Information     Leavy Mole, PA-C. Go on 08/15/2023.   Specialty: Family Medicine Why: at 10:00am with Almarie Fischer MD, Hospital follow up Contact information: [address] [place] [address] [phone]         Hughes Simmonds, MD. Call.   Specialties: Interventional Radiology, Diagnostic Radiology, Radiology Why: Office will call you Contact information: [address] [place] [address] [phone]         Jacobo Evalene PARAS, MD. Call.   Specialty: Oncology Why: office will call Contact information: [address] [place] [address] [phone]                Discharge Exam: Filed Weights   08/04/23 1606 08/05/23 1145 08/08/23 1324  Weight: 83 kg 83 kg 83 kg   General: Not in acute distress Heme: No neck lymph node enlargement. Cardiac: S1/S2, RRR, No murmurs, No gallops or rubs. Respiratory: No rales, wheezing, rhonchi or rubs. GI: Soft, nondistended, nontender, no rebound pain GU: No hematuria Ext: No pitting leg edema bilaterally. 1+DP/PT pulse bilaterally. Musculoskeletal: No joint deformities, No joint redness or warmth, no limitation of ROM in spin. Skin: No rashes.  Neuro: Alert, oriented X3, cranial nerves II-XII grossly intact Psych: Patient is not psychotic, no suicidal or hemocidal ideation.  Condition at discharge: good  The results of significant diagnostics from this hospitalization (including imaging, microbiology, ancillary and laboratory) are listed below for reference.   Imaging Studies: IR INT EXT BILIARY DRAIN WITH  CHOLANGIOGRAM Result Date: 08/08/2023 INDICATION: Pancreatic mass with CBD compression/obstruction. EXAM: Procedures; 1. ULTRASOUND AND FLUOROSCOPIC GUIDED PERCUTANEOUS TRANSHEPATIC CHOLANGIOGRAM AND BILIARY TUBE PLACEMENT 2. FLUOROSCOPIC-GUIDED COMMON BILE DUCT BRUSH BIOPSIES COMPARISON:  MRI ABDOMEN, 08/04/2023.  ABDOMINAL CT, 08/04/2018. MEDICATIONS: Ancef  2 gm IV; The antibiotic was administered with an appropriate time frame prior to the initiation of the procedure 4 g Zofran  IV CONTRAST:  50mL OMNIPAQUE  IOHEXOL  300 MG/ML SOLN - administered into the biliary tree. ANESTHESIA/SEDATION: Moderate (conscious) sedation was employed during this procedure. A total of Versed  2 mg and Fentanyl  200 mcg was administered intravenously. Moderate Sedation Time: 65 minutes. The patient's level of consciousness and vital signs were monitored continuously by radiology nursing throughout the procedure under my direct supervision. FLUOROSCOPY TIME:  Radiation Exposure Index and estimated peak skin dose (PSD); Reference air kerma (RAK), 17.2 mGy. COMPLICATIONS: None immediate. TECHNIQUE: Informed written consent was obtained from the patient and/or patient's representative after a discussion of the risks, benefits and alternatives to treatment. Questions regarding the procedure were encouraged and answered. A timeout was performed prior to the initiation of the procedure. The right upper abdominal quadrant was prepped and draped in the usual sterile fashion, and a sterile drape  was applied covering the operative field. Maximum barrier sterile technique with sterile gowns and gloves were used for the procedure. A timeout was performed prior to the initiation of the procedure. Ultrasound scanning of the right upper abdominal quadrant was performed to delineate the anatomy and avoid transgression of the gallbladder or the pleura. After the overlying soft tissues were anesthetized with 1% Lidocaine  with epinephrine, under direct  ultrasound guidance, a 22 gauge Chiba needle was utilized to cannulate the peripheral aspect of a LEFT intrahepatic biliary duct. Appropriate position was confirmed with limited contrast injection. Next, the duct was cannulated with a Nitrex wire and dilated with an Accustick set under fluoroscopic guidance. Limited cholangiograms were performed in various obliquities confirming appropriate access. Next, a 4 Fr angled glide catheter was advanced through the outer sheath of the Accustick set and with the use of a stiff Glidewire, advanced through the biliary hilum, common bile duct and ampulla to the level of the duodenum. Contrast injection confirmed appropriate positioning. Attention was then directed to the breast biopsy. Using a coaxial brush biopsy kit, multiple passes of the distal common bile duct or performed. The burst within sheath and removed. Under intermittent fluoroscopic guidance and over an Amplatz wire, the track was dilated ultimately allowing placement of a 10 Fr biliary drainage catheter with coil ultimately locked within the duodenum. Contrast was injected and a completion radiographs were obtained in various obliquities. The catheter was connected to a drainage bag which yielded the brisk return of clear bile. The catheter was secured to the skin with an interrupted suture and StatLock device. Dressings were applied. The patient tolerated the procedure well without immediate postprocedural complication. FINDINGS: *Sonographic evaluation of the liver demonstrates intrahepatic biliary ductal dilatation as was demonstrated on preceding abdominal CT. *Under direct ultrasound guidance, a dilated peripheral duct within the LEFT lobe of the liver was accessed allowing placement of a 10 Fr biliary drainage catheter with end ultimately coiled and locked within the duodenum and radiopaque side marker located proximal to the level of the biliary hilum. *Limited contrast injection demonstrates marked  dilatation of the CBD and intrahepatic biliary tree with communication between the right and left biliary trees at the level of the hilum. *Suspected malignant shouldering involving the distal aspect of the CBD, with brush biopsies performed as requested. Additionally, 30 mL of bile was submitted for cytological analysis. IMPRESSION: 1. Successful placement of a 10 Fr percutaneous biliary drainage catheter via a LEFT trans hepatic approach, with end coiled and locked within the duodenum. 2. Distal common bile duct stricture, with brush biopsies performed and additionally, bile submitted for cytological analysis. RECOMMENDATIONS: Patient will return to Vascular [facility]iology (VIR) for routine drainage catheter evaluation and exchange in 6-8 weeks. Thom Hall, MD [facility] Electronically Signed   By: Thom Hall M.D.   On: 08/08/2023 17:07   DG C-Arm 1-60 Min-No Report Result Date: 08/05/2023 Fluoroscopy was utilized by the requesting physician.  No radiographic interpretation.   MR ABDOMEN MRCP W WO CONTAST Result Date: 08/04/2023 CLINICAL DATA:  Elevated liver function studies. Abnormal ultrasound examination. Nonspecific (abnormal) findings on radiological and other examination of musculoskeletal system EXAM: MRI ABDOMEN WITHOUT AND WITH CONTRAST (INCLUDING MRCP) TECHNIQUE: Multiplanar multisequence MR imaging of the abdomen was performed both before and after the administration of intravenous contrast. Heavily T2-weighted images of the biliary and pancreatic ducts were obtained, and three-dimensional MRCP images were rendered by post processing. CONTRAST:  8mL GADAVIST  GADOBUTROL  1  MMOL/ML IV SOLN COMPARISON:  None Available. FINDINGS: Lower chest: The lung bases are grossly clear. No obvious pulmonary lesions, pleural or pericardial effusion. Hepatobiliary: Significant intra and extrahepatic biliary dilatation. The common bile duct  measures up to 15 mm and abruptly becomes very narrowed. There is also marked dilatation of the main pancreatic duct which also becomes markedly narrowed at the same spot (classic double duct sign). There is also cystic duct dilatation and marked distention of the gallbladder. No hepatic lesions to suggest metastatic disease. Pancreas: Marked distension of the main pancreatic duct along with severe atrophy of the pancreatic body and tail. Hypoenhancing infiltrating lesion involving the lower pancreatic head accounting for the common bile duct and main pancreatic ductal dilatation. This lesion measures approximately 2.8 x 1.8 cm. No obvious involvement of the major vascular structures. The splenic and portal veins are patent. Spleen:  Normal size.  No focal lesions. Adrenals/Urinary Tract: The adrenal glands and kidneys are unremarkable. 9 mm interpolar right renal cyst not requiring any further imaging evaluation follow-up. Stomach/Bowel: The stomach, duodenum, visualized small and visualized colon are grossly normal. Vascular/Lymphatic: The aorta and branch vessels are patent. The major venous structures are patent. No obvious abdominal adenopathy. Other: No ascites or abdominal wall hernia. No subcutaneous lesions. Musculoskeletal: No significant bony findings. IMPRESSION: 1. 2.8 x 1.8 cm hypoenhancing infiltrating lesion involving the lower pancreatic head accounting for the common bile duct and main pancreatic ductal dilatation. Findings consistent with pancreatic adenocarcinoma. 2. No obvious involvement of the major vascular structures. 3. No abdominal adenopathy or hepatic lesions to suggest metastatic disease. Electronically Signed   By: MYRTIS Stammer M.D.   On: 08/04/2023 23:39   MR 3D Recon At Scanner Result Date: 08/04/2023 CLINICAL DATA:  Elevated liver function studies. Abnormal ultrasound examination. Nonspecific (abnormal) findings on radiological and other examination of musculoskeletal system  EXAM: MRI ABDOMEN WITHOUT AND WITH CONTRAST (INCLUDING MRCP) TECHNIQUE: Multiplanar multisequence MR imaging of the abdomen was performed both before and after the administration of intravenous contrast. Heavily T2-weighted images of the biliary and pancreatic ducts were obtained, and three-dimensional MRCP images were rendered by post processing. CONTRAST:  8mL GADAVIST  GADOBUTROL  1 MMOL/ML IV SOLN COMPARISON:  None Available. FINDINGS: Lower chest: The lung bases are grossly clear. No obvious pulmonary lesions, pleural or pericardial effusion. Hepatobiliary: Significant intra and extrahepatic biliary dilatation. The common bile duct measures up to 15 mm and abruptly becomes very narrowed. There is also marked dilatation of the main pancreatic duct which also becomes markedly narrowed at the same spot (classic double duct sign). There is also cystic duct dilatation and marked distention of the gallbladder. No hepatic lesions to suggest metastatic disease. Pancreas: Marked distension of the main pancreatic duct along with severe atrophy of the pancreatic body and tail. Hypoenhancing infiltrating lesion involving the lower pancreatic head accounting for the common bile duct and main pancreatic ductal dilatation. This lesion measures approximately 2.8 x 1.8 cm. No obvious involvement of the major vascular structures. The splenic and portal veins are patent. Spleen:  Normal size.  No focal lesions. Adrenals/Urinary Tract: The adrenal glands and kidneys are unremarkable. 9 mm interpolar right renal cyst not requiring any further imaging evaluation follow-up. Stomach/Bowel: The stomach, duodenum, visualized small and visualized colon are grossly normal. Vascular/Lymphatic: The aorta and branch vessels are patent. The major venous structures are patent. No obvious abdominal adenopathy. Other: No ascites or abdominal wall hernia. No subcutaneous lesions. Musculoskeletal: No significant bony findings. IMPRESSION: 1. 2.8 x  1.8 cm hypoenhancing infiltrating lesion involving the lower pancreatic head accounting for the common bile duct and main pancreatic ductal dilatation. Findings consistent with pancreatic adenocarcinoma. 2. No obvious involvement of the major vascular structures. 3. No abdominal adenopathy or hepatic lesions to suggest metastatic disease. Electronically Signed   By: MYRTIS Stammer M.D.   On: 08/04/2023 23:39   US  ABDOMEN LIMITED RUQ (LIVER/GB) Result Date: 08/04/2023 CLINICAL DATA:  Upper abdominal pain, decreased appetite, weight loss for months following episode of nausea and vomiting in April. Elevated liver function tests, including alk-phos. EXAM: ULTRASOUND ABDOMEN LIMITED RIGHT UPPER QUADRANT COMPARISON:  None available FINDINGS: Gallbladder: Gallstones: None Sludge: None Gallbladder Wall: Present Pericholecystic fluid: None Sonographic Murphy's Sign: Negative per technologist Gallbladder is dilated measuring up to 13.9 x 5.4 x 4.5 cm Common bile duct: Diameter: 14 mm Liver: Parenchymal echogenicity: Within normal limits Contours: Normal Lesions: None Portal vein: Patent.  Hepatopetal flow Other: None. IMPRESSION: Dilated common bile duct and gallbladder suspicious for stricture, mass, or choledocholith obstructing the flow of bile in the distal common bile duct. Further evaluation with contrast enhanced abdominal MRI/MRCP or ERCP should be performed. Electronically Signed   By: Aliene Lloyd M.D.   On: 08/04/2023 08:39    Microbiology: Results for orders placed or performed in visit on 07/26/22  Urine Culture     Status: None   Collection Time: 07/26/22 11:10 AM   Specimen: Urine  Result Value Ref Range Status   MICRO NUMBER: [id number]  Final   SPECIMEN QUALITY: Adequate  Final   Sample Source URINE  Final   STATUS: FINAL  Final   Result: No Growth  Final    Labs: CBC: Recent Labs  Lab 08/04/23 1608 08/05/23 0348 08/08/23 0559 08/09/23 0530  WBC 7.1 6.8 6.7 10.3  HGB 13.0 12.8 12.7  14.2  HCT 38.0 39.0 37.9 42.7  MCV 91.1 92.4 92.7 90.9  PLT 324 314 323 360   Basic Metabolic Panel: Recent Labs  Lab 08/04/23 1608 08/05/23 0348 08/06/23 0616 08/07/23 0321 08/08/23 0559  NA 134* 137 138 136 137  K 3.4* 3.3* 4.0 3.9 3.5  CL 97* 100 103 106 103  CO2 25 28 28 24 26   GLUCOSE 135* 116* 116* 108* 117*  BUN 18 15 13 10 13   CREATININE 1.02* 0.33* 0.80 0.74 0.66  CALCIUM  9.6 9.4 9.5 9.3 9.4  MG 2.0  --   --   --   --    Liver Function Tests: Recent Labs  Lab 08/04/23 1608 08/05/23 0348 08/06/23 0616 08/07/23 0321 08/08/23 0559  AST 146* 138* 103* 75* 87*  ALT 172* 161* 129* 102* 105*  ALKPHOS 1,134* 1,003* 893* 768* 765*  BILITOT 3.0* 2.8* 3.1* 2.6* 3.4*  PROT 7.7 7.7 6.9 6.8 7.4  ALBUMIN 3.7 3.6 3.3* 3.1* 3.3*   CBG: Recent Labs  Lab 08/07/23 0546 08/07/23 0804 08/07/23 1204 08/08/23 0527 08/09/23 0752  GLUCAP 101* 95 111* 111* 90    Discharge time spent:  34 minutes.  Signed: Drue ONEIDA Potter, MD [facility] 08/09/2023

## 2023-08-09 NOTE — Progress Notes (Signed)
 Referring Provider(s): Dr. [name], MD  Supervising Physician: Philip Cornet  Patient Status:  [facility] - In-pt  Chief Complaint: Pancreatic mass with biliary obstruction  Brief History:  Patient reports her urine is dark and her stool is lighter in color for 2 weeks prior to admission. PCP on 7/7 ran labs, noting elevated LFTs. RUQ US  then revealed a dilated common bile duct. PCP recommended pt present to ED for possible admission. While inpatient, an MRCP was positive for a 2.8 x 1.8 cm pancreatic mass consistent with adenocarcinoma. GI was consulted recommended ERCP with stent placement. Unfortunately the wire could not be passed into the CBD and IR was consulted for percutaneous biliary drain placement with brushings, which patient received on 7/14. She is slated for discharge today.   Subjective:  Patient alert and laying in bed, calm.  Currently without any significant complaints.  Good bilious output in gravity collection bag, which patient states RN has already emptied at least once.  Patient denies any fevers, headache, chest pain, SOB, cough, abdominal pain, nausea, vomiting or bleeding.    Allergies: Nsaids  Medications: Prior to Admission medications   Medication Sig Start Date End Date Taking? Authorizing Provider  albuterol  (VENTOLIN  HFA) 108 (90 Base) MCG/ACT inhaler Inhale 2 puffs into the lungs every 4 (four) hours as needed for wheezing or shortness of breath. 03/31/23  Yes Tapia, Leisa, PA-C  allopurinol  (ZYLOPRIM ) 100 MG tablet Take 2 tablets (200 mg total) by mouth daily. 08/01/23  Yes Tapia, Leisa, PA-C  apixaban (ELIQUIS) 2.5 MG TABS tablet Take 2.5 mg by mouth 2 (two) times daily.   Yes [provider]  Budeson-Glycopyrrol-Formoterol  (BREZTRI  AEROSPHERE) 160-9-4.8 MCG/ACT AERO Inhale 2 puffs into the lungs 2 (two) times daily. 04/19/22  Yes Tapia, Leisa, PA-C  enalapril  (VASOTEC ) 2.5 MG tablet Take 1 tablet (2.5 mg total) by mouth daily. 10/05/22  Yes  Tapia, Leisa, PA-C  hydrochlorothiazide  (MICROZIDE ) 12.5 MG capsule Take 1 capsule (12.5 mg total) by mouth daily. 08/01/23  Yes Tapia, Leisa, PA-C  lovastatin  (MEVACOR ) 40 MG tablet Take 1 tablet (40 mg total) by mouth at bedtime. 03/31/23  Yes Tapia, Leisa, PA-C  Multiple Vitamins-Minerals (MULTIVITAMIN WOMEN 50+ PO) Take 1 tablet by mouth daily.   Yes [provider]  ondansetron  (ZOFRAN -ODT) 4 MG disintegrating tablet Take 1 tablet (4 mg total) by mouth every 8 (eight) hours as needed for nausea or vomiting. 08/09/23  Yes Djan, Drue DASEN, MD  pantoprazole  (PROTONIX ) 40 MG tablet Take 1 tablet (40 mg total) by mouth daily. 08/01/23  Yes Tapia, Leisa, PA-C  simethicone  (GAS-X) 80 MG chewable tablet Chew 1 tablet (80 mg total) by mouth 2 (two) times a day. 08/02/18 06/17/28 Yes Dorothyann Drivers, MD  Sodium Chloride  Flush (NORMAL SALINE FLUSH) 0.9 % SOLN Inject 10 mLs into the vein daily. 08/09/23 10/08/23 Yes Randel Hargens A, PA-C  triamcinolone  cream (KENALOG ) 0.1 % APPLY TO AFFECTED AREA TWICE DAILY IF NEEDED. NO MORE THAN 1 WEEK AT A TIME, THEN TAKE A BREAK FOR 2 WEEKS. 02/10/22  Yes Bernardo Fend, DO  acetaminophen  (TYLENOL ) 650 MG CR tablet Take 650 mg by mouth every 8 (eight) hours as needed for pain. Patient not taking: Reported on 04/11/2023    [provider]  Blood Pressure Monitoring (ADULT BLOOD PRESSURE CUFF LG) KIT 1 each by Does not apply route daily. 09/01/21   Bernardo Fend, DO  cholecalciferol (VITAMIN D3) 25 MCG (1000 UNIT) tablet Take 1,000 Units by mouth daily. Patient  not taking: Reported on 08/05/2023    [provider]  ELIQUIS 5 MG TABS tablet Take 5 mg by mouth 2 (two) times daily. Patient not taking: Reported on 08/05/2023 04/21/19   [provider]  famotidine  (PEPCID ) 20 MG tablet Take 20 mg by mouth daily as needed for heartburn or indigestion. Patient not taking: Reported on 08/05/2023    [provider]     Vital Signs: BP  109/82 (BP Location: Right Arm)   Pulse 82   Temp 98.1 F (36.7 C)   Resp 17   Ht 5' 4 (1.626 m)   Wt 182 lb 15.7 oz (83 kg)   SpO2 99%   BMI 31.41 kg/m   Physical Exam Constitutional:      Appearance: Normal appearance.  Cardiovascular:     Rate and Rhythm: Normal rate.     Pulses: Normal pulses.  Pulmonary:     Effort: Pulmonary effort is normal.  Abdominal:     General: Abdomen is flat.     Palpations: Abdomen is soft.     Comments: Percutaneous cholecystotomy drain appropriately dressed. Dressing is clean, dry, intact. Drain incision site non-tender, without evidence of infection. Retaining suture in place.  Good bilious output in gravity collection bag, approximately 300 cc. Line flushes well.    Musculoskeletal:        General: Normal range of motion.  Skin:    General: Skin is warm and dry.  Neurological:     Mental Status: She is alert and oriented to person, place, and time.  Psychiatric:        Mood and Affect: Mood normal.        Behavior: Behavior normal.      Labs:  CBC: Recent Labs    08/04/23 1608 08/05/23 0348 08/08/23 0559 08/09/23 0530  WBC 7.1 6.8 6.7 10.3  HGB 13.0 12.8 12.7 14.2  HCT 38.0 39.0 37.9 42.7  PLT 324 314 323 360    COAGS: Recent Labs    08/04/23 2051 08/08/23 0559  INR 1.1 1.1  APTT 34  --     BMP: Recent Labs    08/05/23 0348 08/06/23 0616 08/07/23 0321 08/08/23 0559  NA 137 138 136 137  K 3.3* 4.0 3.9 3.5  CL 100 103 106 103  CO2 28 28 24 26   GLUCOSE 116* 116* 108* 117*  BUN 15 13 10 13   CALCIUM  9.4 9.5 9.3 9.4  CREATININE 0.33* 0.80 0.74 0.66  GFRNONAA >60 >60 >60 >60    LIVER FUNCTION TESTS: Recent Labs    08/05/23 0348 08/06/23 0616 08/07/23 0321 08/08/23 0559  BILITOT 2.8* 3.1* 2.6* 3.4*  AST 138* 103* 75* 87*  ALT 161* 129* 102* 105*  ALKPHOS 1,003* 893* 768* 765*  PROT 7.7 6.9 6.8 7.4  ALBUMIN 3.6 3.3* 3.1* 3.3*    Assessment:  Perc chole drain appropriately dressed. Dressing is  clean, dry, intact.  Drain site non-tender. Retaining suture in place. Good bilious output in gravity collection bag. Line flushes well.   Plan:  IR agreeable to discharge plan today. Patient was instructed in person on drain maintenance, including dressing changes every 2-3 days, or anytime dressing is wet or soiled, as well as daily flushing with 5 mL of saline to keep drain patent. Patient knows to pick up Rx for flushes at [facility] pharmacy. Patient knows to expect a scheduler to call for a 6-8 week outpatient follow up for drain exchange at either [facility] or [facility]. Patient  knows to call sooner if she has an questions or concerns. Number to call is highlighted on discharge paperwork. If patient is unable to flush her drain, with little to no output in bag, and experiencing pain or recurrence of prior symptoms, patient knows to present urgently to ED.    Thank you for this interesting consult.  I greatly enjoyed meeting Colleen [name] and look forward to participating in their care.   Electronically Signed: Carlin DELENA Griffon, PA-C 08/09/2023, 12:56 PM     I spent a total of 15 Minutes at the the patient's bedside AND on the patient's hospital floor or unit, greater than 50% of which was counseling/coordinating care for pancreatic mass with biliary obstruction, s/p percutaneous cholecystotomy drain placement.

## 2023-08-10 ENCOUNTER — Other Ambulatory Visit

## 2023-08-10 ENCOUNTER — Ambulatory Visit: Payer: Self-pay | Admitting: Gastroenterology

## 2023-08-10 ENCOUNTER — Other Ambulatory Visit: Payer: Self-pay | Admitting: Oncology

## 2023-08-10 ENCOUNTER — Ambulatory Visit

## 2023-08-10 DIAGNOSIS — K831 Obstruction of bile duct: Secondary | ICD-10-CM

## 2023-08-10 DIAGNOSIS — K8689 Other specified diseases of pancreas: Secondary | ICD-10-CM

## 2023-08-10 LAB — CYTOLOGY - NON PAP

## 2023-08-11 ENCOUNTER — Telehealth: Payer: Self-pay | Admitting: *Deleted

## 2023-08-11 NOTE — Telephone Encounter (Signed)
 I spoke to the patient and told her that she can just come up and drive the vehicle under the cover and they would be a person that will take care of it and give you a little  card and they would park the car for you with no pay.  When you are finished you will give them that ticket back and they will go get your car.  If you need a wheelchair when she you get in here just ask for 1 and they will help you downstairs to get a wheelchair.  She only has 2 things 1 to get labs and the second 1 for Dr. Jacobo.  She says that that is okay for her.  She also wants paperwork to see if she can get some help because the surgery is going to be a lot of money and she needs help.  I will make a copy and she can take at home and work on it

## 2023-08-12 ENCOUNTER — Encounter: Payer: Self-pay | Admitting: Oncology

## 2023-08-12 ENCOUNTER — Ambulatory Visit: Admitting: Physical Therapy

## 2023-08-12 ENCOUNTER — Other Ambulatory Visit: Payer: Self-pay

## 2023-08-12 ENCOUNTER — Inpatient Hospital Stay: Attending: Oncology

## 2023-08-12 ENCOUNTER — Inpatient Hospital Stay: Admitting: Oncology

## 2023-08-12 VITALS — BP 103/45 | HR 96 | Temp 97.2°F | Resp 16 | Ht 64.0 in | Wt 175.0 lb

## 2023-08-12 DIAGNOSIS — K8689 Other specified diseases of pancreas: Secondary | ICD-10-CM

## 2023-08-12 DIAGNOSIS — R630 Anorexia: Secondary | ICD-10-CM

## 2023-08-12 DIAGNOSIS — D378 Neoplasm of uncertain behavior of other specified digestive organs: Secondary | ICD-10-CM | POA: Insufficient documentation

## 2023-08-12 LAB — CMP (CANCER CENTER ONLY)
ALT: 55 U/L — ABNORMAL HIGH (ref 0–44)
AST: 41 U/L (ref 15–41)
Albumin: 3.7 g/dL (ref 3.5–5.0)
Alkaline Phosphatase: 610 U/L — ABNORMAL HIGH (ref 38–126)
Anion gap: 11 (ref 5–15)
BUN: 26 mg/dL — ABNORMAL HIGH (ref 8–23)
CO2: 23 mmol/L (ref 22–32)
Calcium: 9.4 mg/dL (ref 8.9–10.3)
Chloride: 95 mmol/L — ABNORMAL LOW (ref 98–111)
Creatinine: 1.09 mg/dL — ABNORMAL HIGH (ref 0.44–1.00)
GFR, Estimated: 52 mL/min — ABNORMAL LOW (ref >60)
Glucose, Bld: 121 mg/dL — ABNORMAL HIGH (ref 70–99)
Potassium: 3.6 mmol/L (ref 3.5–5.1)
Sodium: 129 mmol/L — ABNORMAL LOW (ref 135–145)
Total Bilirubin: 1.7 mg/dL — ABNORMAL HIGH (ref 0.0–1.2)
Total Protein: 7.7 g/dL (ref 6.5–8.1)

## 2023-08-12 NOTE — Patient Instructions (Signed)
 NEXT STEPS  PET scan at [facility]  Endoscopic Ultrasound at [facility] to obtain biopsy of pancreas. They will contact you for this appointment with instructions.

## 2023-08-12 NOTE — Progress Notes (Signed)
 Met with Colleen [name]. Provided nurse navigator information and provided contact information for future needs. EUS will be arranged at [facility].  She was instructed to stop her Eliquis 2 days before her EUS. [facility] will provide her instructions on this as well. Provided PET scan instructions verbally and in AVS. Her appetite remains poor. Ensure samples provided and she will see dietician.  Dressing to biliary drain changed. Site with no drainage or signs of infection.

## 2023-08-12 NOTE — Progress Notes (Signed)
 Patient is feeling better since she has been out of the hospital. She still doesn't have an appetite.

## 2023-08-12 NOTE — Progress Notes (Signed)
 [facility]  [phone] Fax:([phone]) [phone]  ID: Colleen [name] OB: 01/25/1946  MR#: [id number]  CSN#:[id number]  Patient Care Team: Leavy Mole, PA-C as PCP - General (Family Medicine) Darliss Rogue, MD as PCP - Cardiology (Cardiology) Dave Agent, MD as Consulting Physician (Rheumatology) Emilio Garrie BRAVO, RN (Inactive) as Registered Nurse Ceola Tereasa PARAS, MD as Referring Physician (Oncology) Therisa Bi, MD as Consulting Physician (Gastroenterology) Maurie Rayfield BIRCH, RN as Oncology Nurse Navigator Jacobo, Evalene PARAS, MD as Consulting Physician (Oncology)  CHIEF COMPLAINT: Pancreatic mass.  INTERVAL HISTORY: Patient returns to clinic today for hospital follow-up and further diagnostic planning.  She continues to have weakness and fatigue, but otherwise feels well.  She has no neurologic complaints.  She denies any recent fevers.  She has a fair appetite, but denies weight loss.  She has no chest pain, shortness of breath, cough, or hemoptysis.  She does not complain of abdominal pain today.  She denies any nausea, vomiting, constipation, or diarrhea.  She has no melena or hematochezia.  She has no urinary complaints.  Patient offers no further specific complaints today.  REVIEW OF SYSTEMS:   Review of Systems  Constitutional:  Positive for malaise/fatigue. Negative for fever and weight loss.  Respiratory: Negative.  Negative for cough, hemoptysis and shortness of breath.   Cardiovascular: Negative.  Negative for chest pain and leg swelling.  Gastrointestinal: Negative.  Negative for abdominal pain.  Genitourinary: Negative.  Negative for dysuria.  Musculoskeletal: Negative.  Negative for back pain.  Skin: Negative.  Negative for rash.  Neurological: Negative.  Negative for dizziness, focal weakness, weakness and headaches.  Psychiatric/Behavioral: Negative.  The patient is not nervous/anxious.     As per HPI. Otherwise, a complete review of  systems is negative.  PAST MEDICAL HISTORY: Past Medical History:  Diagnosis Date   Asthma    Cataract    COPD (chronic obstructive pulmonary disease) (HCC)    GERD (gastroesophageal reflux disease)    Gout    History of pulmonary embolus (PE)    Hyperlipidemia    Hypertension    Osteoarthrosis    Prediabetes     PAST SURGICAL HISTORY: Past Surgical History:  Procedure Laterality Date   BACK SURGERY     BREAST BIOPSY Left 04/08/2014   intraductal papilloma 5 mm   COLONOSCOPY WITH PROPOFOL  N/A 04/23/2020   Procedure: COLONOSCOPY WITH PROPOFOL ;  Surgeon: Janalyn Keene NOVAK, MD;  Location: [facility] ENDOSCOPY;  Service: Endoscopy;  Laterality: N/A;   ERCP N/A 08/05/2023   Procedure: ERCP, WITH INTERVENTION IF INDICATED;  Surgeon: Jinny Carmine, MD;  Location: [facility] ENDOSCOPY;  Service: Endoscopy;  Laterality: N/A;   ESOPHAGOGASTRODUODENOSCOPY (EGD) WITH PROPOFOL  N/A 04/23/2020   Procedure: ESOPHAGOGASTRODUODENOSCOPY (EGD) WITH PROPOFOL ;  Surgeon: Janalyn Keene NOVAK, MD;  Location: [facility] ENDOSCOPY;  Service: Endoscopy;  Laterality: N/A;   GIVENS CAPSULE STUDY N/A 01/28/2022   Procedure: GIVENS CAPSULE STUDY;  Surgeon: Therisa Bi, MD;  Location: [facility] ENDOSCOPY;  Service: Gastroenterology;  Laterality: N/A;   IR INT EXT BILIARY DRAIN WITH CHOLANGIOGRAM  08/08/2023   LUMBAR LAMINECTOMY     TONSILLECTOMY      FAMILY HISTORY: Family History  Problem Relation Age of Onset   Diabetes Mother    Lung cancer Father    Diabetes Sister    Congestive Heart Failure Sister    Glaucoma Sister    Breast cancer Maternal Aunt     ADVANCED DIRECTIVES (Y/N):  N  HEALTH MAINTENANCE: Social History   Tobacco  Use   Smoking status: Former    Current packs/day: 0.00    Average packs/day: 1.5 packs/day for 20.0 years (30.0 ttl pk-yrs)    Types: Cigarettes    Start date: 11/13/1988    Quit date: 11/13/2008    Years since quitting: 14.7   Smokeless tobacco: Never   Tobacco comments:    smoking  cessation materials not required  Vaping Use   Vaping status: Never Used  Substance Use Topics   Alcohol use: Not Currently   Drug use: No     Colonoscopy:  PAP:  Bone density:  Lipid panel:  Allergies  Allergen Reactions   Nsaids     Other Reaction(s): Not available    Current Outpatient Medications  Medication Sig Dispense Refill   acetaminophen  (TYLENOL ) 650 MG CR tablet Take 650 mg by mouth every 8 (eight) hours as needed for pain.     albuterol  (VENTOLIN  HFA) 108 (90 Base) MCG/ACT inhaler Inhale 2 puffs into the lungs every 4 (four) hours as needed for wheezing or shortness of breath. 8 g 2   allopurinol  (ZYLOPRIM ) 100 MG tablet Take 2 tablets (200 mg total) by mouth daily. 180 tablet 1   apixaban (ELIQUIS) 2.5 MG TABS tablet Take 2.5 mg by mouth 2 (two) times daily.     Blood Pressure Monitoring (ADULT BLOOD PRESSURE CUFF LG) KIT 1 each by Does not apply route daily. 1 kit 0   Budeson-Glycopyrrol-Formoterol  (BREZTRI  AEROSPHERE) 160-9-4.8 MCG/ACT AERO Inhale 2 puffs into the lungs 2 (two) times daily. 3 each 1   cholecalciferol (VITAMIN D3) 25 MCG (1000 UNIT) tablet Take 1,000 Units by mouth daily.     enalapril  (VASOTEC ) 2.5 MG tablet Take 1 tablet (2.5 mg total) by mouth daily. 90 tablet 3   hydrochlorothiazide  (MICROZIDE ) 12.5 MG capsule Take 1 capsule (12.5 mg total) by mouth daily. 90 capsule 1   Multiple Vitamins-Minerals (MULTIVITAMIN WOMEN 50+ PO) Take 1 tablet by mouth daily.     ondansetron  (ZOFRAN -ODT) 4 MG disintegrating tablet Take 1 tablet (4 mg total) by mouth every 8 (eight) hours as needed for nausea or vomiting. 20 tablet 0   pantoprazole  (PROTONIX ) 40 MG tablet Take 1 tablet (40 mg total) by mouth daily. 30 tablet 3   simethicone  (GAS-X) 80 MG chewable tablet Chew 1 tablet (80 mg total) by mouth 2 (two) times a day. 60 tablet 2   Sodium Chloride  Flush (NORMAL SALINE FLUSH) 0.9 % SOLN Inject 10 mLs into the vein daily. 300 mL 1   triamcinolone  cream (KENALOG )  0.1 % APPLY TO AFFECTED AREA TWICE DAILY IF NEEDED. NO MORE THAN 1 WEEK AT A TIME, THEN TAKE A BREAK FOR 2 WEEKS. 80 g 0   famotidine  (PEPCID ) 20 MG tablet Take 20 mg by mouth daily as needed for heartburn or indigestion. (Patient not taking: Reported on 08/12/2023)     No current facility-administered medications for this visit.    OBJECTIVE: Vitals:   08/12/23 1026  BP: (!) 103/45  Pulse: 96  Resp: 16  Temp: (!) 97.2 F (36.2 C)  SpO2: 97%     Body mass index is 30.04 kg/m.    ECOG FS:1 - Symptomatic but completely ambulatory  General: Well-developed, well-nourished, no acute distress.  Sitting in a wheelchair. Eyes: Pink conjunctiva, anicteric sclera. HEENT: Normocephalic, moist mucous membranes. Lungs: No audible wheezing or coughing. Heart: Regular rate and rhythm. Abdomen: Soft, nontender, no obvious distention. Musculoskeletal: No edema, cyanosis, or clubbing. Neuro: Alert, answering all questions  appropriately. Cranial nerves grossly intact. Skin: No rashes or petechiae noted. Psych: Normal affect.  LAB RESULTS:  Lab Results  Component Value Date   NA 129 (L) 08/12/2023   K 3.6 08/12/2023   CL 95 (L) 08/12/2023   CO2 23 08/12/2023   GLUCOSE 121 (H) 08/12/2023   BUN 26 (H) 08/12/2023   CREATININE 1.09 (H) 08/12/2023   CALCIUM  9.4 08/12/2023   PROT 7.7 08/12/2023   ALBUMIN 3.7 08/12/2023   AST 41 08/12/2023   ALT 55 (H) 08/12/2023   ALKPHOS 610 (H) 08/12/2023   BILITOT 1.7 (H) 08/12/2023   GFRNONAA 52 (L) 08/12/2023   GFRAA 58 (L) 02/28/2020    Lab Results  Component Value Date   WBC 10.3 08/09/2023   NEUTROABS 2,928 08/01/2023   HGB 14.2 08/09/2023   HCT 42.7 08/09/2023   MCV 90.9 08/09/2023   PLT 360 08/09/2023     STUDIES: IR INT EXT BILIARY DRAIN WITH CHOLANGIOGRAM Result Date: 08/08/2023 INDICATION: Pancreatic mass with CBD compression/obstruction. EXAM: Procedures; 1. ULTRASOUND AND FLUOROSCOPIC GUIDED PERCUTANEOUS TRANSHEPATIC CHOLANGIOGRAM  AND BILIARY TUBE PLACEMENT 2. FLUOROSCOPIC-GUIDED COMMON BILE DUCT BRUSH BIOPSIES COMPARISON:  MRI ABDOMEN, 08/04/2023.  ABDOMINAL CT, 08/04/2018. MEDICATIONS: Ancef  2 gm IV; The antibiotic was administered with an appropriate time frame prior to the initiation of the procedure 4 g Zofran  IV CONTRAST:  50mL OMNIPAQUE  IOHEXOL  300 MG/ML SOLN - administered into the biliary tree. ANESTHESIA/SEDATION: Moderate (conscious) sedation was employed during this procedure. A total of Versed  2 mg and Fentanyl  200 mcg was administered intravenously. Moderate Sedation Time: 65 minutes. The patient's level of consciousness and vital signs were monitored continuously by radiology nursing throughout the procedure under my direct supervision. FLUOROSCOPY TIME:  Radiation Exposure Index and estimated peak skin dose (PSD); Reference air kerma (RAK), 17.2 mGy. COMPLICATIONS: None immediate. TECHNIQUE: Informed written consent was obtained from the patient and/or patient's representative after a discussion of the risks, benefits and alternatives to treatment. Questions regarding the procedure were encouraged and answered. A timeout was performed prior to the initiation of the procedure. The right upper abdominal quadrant was prepped and draped in the usual sterile fashion, and a sterile drape was applied covering the operative field. Maximum barrier sterile technique with sterile gowns and gloves were used for the procedure. A timeout was performed prior to the initiation of the procedure. Ultrasound scanning of the right upper abdominal quadrant was performed to delineate the anatomy and avoid transgression of the gallbladder or the pleura. After the overlying soft tissues were anesthetized with 1% Lidocaine  with epinephrine, under direct ultrasound guidance, a 22 gauge Chiba needle was utilized to cannulate the peripheral aspect of a LEFT intrahepatic biliary duct. Appropriate position was confirmed with limited contrast injection.  Next, the duct was cannulated with a Nitrex wire and dilated with an Accustick set under fluoroscopic guidance. Limited cholangiograms were performed in various obliquities confirming appropriate access. Next, a 4 Fr angled glide catheter was advanced through the outer sheath of the Accustick set and with the use of a stiff Glidewire, advanced through the biliary hilum, common bile duct and ampulla to the level of the duodenum. Contrast injection confirmed appropriate positioning. Attention was then directed to the breast biopsy. Using a coaxial brush biopsy kit, multiple passes of the distal common bile duct or performed. The burst within sheath and removed. Under intermittent fluoroscopic guidance and over an Amplatz wire, the track was dilated ultimately allowing placement of a 10 Fr biliary drainage catheter with coil ultimately  locked within the duodenum. Contrast was injected and a completion radiographs were obtained in various obliquities. The catheter was connected to a drainage bag which yielded the brisk return of clear bile. The catheter was secured to the skin with an interrupted suture and StatLock device. Dressings were applied. The patient tolerated the procedure well without immediate postprocedural complication. FINDINGS: *Sonographic evaluation of the liver demonstrates intrahepatic biliary ductal dilatation as was demonstrated on preceding abdominal CT. *Under direct ultrasound guidance, a dilated peripheral duct within the LEFT lobe of the liver was accessed allowing placement of a 10 Fr biliary drainage catheter with end ultimately coiled and locked within the duodenum and radiopaque side marker located proximal to the level of the biliary hilum. *Limited contrast injection demonstrates marked dilatation of the CBD and intrahepatic biliary tree with communication between the right and left biliary trees at the level of the hilum. *Suspected malignant shouldering involving the distal aspect of  the CBD, with brush biopsies performed as requested. Additionally, 30 mL of bile was submitted for cytological analysis. IMPRESSION: 1. Successful placement of a 10 Fr percutaneous biliary drainage catheter via a LEFT trans hepatic approach, with end coiled and locked within the duodenum. 2. Distal common bile duct stricture, with brush biopsies performed and additionally, bile submitted for cytological analysis. RECOMMENDATIONS: Patient will return to Vascular Interventional Radiology (VIR) for routine drainage catheter evaluation and exchange in 6-8 weeks. Thom Hall, MD [facility] Electronically Signed   By: Thom Hall M.D.   On: 08/08/2023 17:07   DG C-Arm 1-60 Min-No Report Result Date: 08/05/2023 Fluoroscopy was utilized by the requesting physician.  No radiographic interpretation.   MR ABDOMEN MRCP W WO CONTAST Result Date: 08/04/2023 CLINICAL DATA:  Elevated liver function studies. Abnormal ultrasound examination. Nonspecific (abnormal) findings on radiological and other examination of musculoskeletal system EXAM: MRI ABDOMEN WITHOUT AND WITH CONTRAST (INCLUDING MRCP) TECHNIQUE: Multiplanar multisequence MR imaging of the abdomen was performed both before and after the administration of intravenous contrast. Heavily T2-weighted images of the biliary and pancreatic ducts were obtained, and three-dimensional MRCP images were rendered by post processing. CONTRAST:  8mL GADAVIST  GADOBUTROL  1 MMOL/ML IV SOLN COMPARISON:  None Available. FINDINGS: Lower chest: The lung bases are grossly clear. No obvious pulmonary lesions, pleural or pericardial effusion. Hepatobiliary: Significant intra and extrahepatic biliary dilatation. The common bile duct measures up to 15 mm and abruptly becomes very narrowed. There is also marked dilatation of the main pancreatic duct which also becomes markedly narrowed at the same spot (classic double duct sign). There  is also cystic duct dilatation and marked distention of the gallbladder. No hepatic lesions to suggest metastatic disease. Pancreas: Marked distension of the main pancreatic duct along with severe atrophy of the pancreatic body and tail. Hypoenhancing infiltrating lesion involving the lower pancreatic head accounting for the common bile duct and main pancreatic ductal dilatation. This lesion measures approximately 2.8 x 1.8 cm. No obvious involvement of the major vascular structures. The splenic and portal veins are patent. Spleen:  Normal size.  No focal lesions. Adrenals/Urinary Tract: The adrenal glands and kidneys are unremarkable. 9 mm interpolar right renal cyst not requiring any further imaging evaluation follow-up. Stomach/Bowel: The stomach, duodenum, visualized small and visualized colon are grossly normal. Vascular/Lymphatic: The aorta and branch vessels are patent. The major venous structures are patent. No obvious abdominal adenopathy. Other: No ascites or abdominal wall hernia. No subcutaneous lesions. Musculoskeletal: No significant bony findings. IMPRESSION: 1. 2.8 x  1.8 cm hypoenhancing infiltrating lesion involving the lower pancreatic head accounting for the common bile duct and main pancreatic ductal dilatation. Findings consistent with pancreatic adenocarcinoma. 2. No obvious involvement of the major vascular structures. 3. No abdominal adenopathy or hepatic lesions to suggest metastatic disease. Electronically Signed   By: MYRTIS Stammer M.D.   On: 08/04/2023 23:39   MR 3D Recon At Scanner Result Date: 08/04/2023 CLINICAL DATA:  Elevated liver function studies. Abnormal ultrasound examination. Nonspecific (abnormal) findings on radiological and other examination of musculoskeletal system EXAM: MRI ABDOMEN WITHOUT AND WITH CONTRAST (INCLUDING MRCP) TECHNIQUE: Multiplanar multisequence MR imaging of the abdomen was performed both before and after the administration of intravenous contrast.  Heavily T2-weighted images of the biliary and pancreatic ducts were obtained, and three-dimensional MRCP images were rendered by post processing. CONTRAST:  8mL GADAVIST  GADOBUTROL  1 MMOL/ML IV SOLN COMPARISON:  None Available. FINDINGS: Lower chest: The lung bases are grossly clear. No obvious pulmonary lesions, pleural or pericardial effusion. Hepatobiliary: Significant intra and extrahepatic biliary dilatation. The common bile duct measures up to 15 mm and abruptly becomes very narrowed. There is also marked dilatation of the main pancreatic duct which also becomes markedly narrowed at the same spot (classic double duct sign). There is also cystic duct dilatation and marked distention of the gallbladder. No hepatic lesions to suggest metastatic disease. Pancreas: Marked distension of the main pancreatic duct along with severe atrophy of the pancreatic body and tail. Hypoenhancing infiltrating lesion involving the lower pancreatic head accounting for the common bile duct and main pancreatic ductal dilatation. This lesion measures approximately 2.8 x 1.8 cm. No obvious involvement of the major vascular structures. The splenic and portal veins are patent. Spleen:  Normal size.  No focal lesions. Adrenals/Urinary Tract: The adrenal glands and kidneys are unremarkable. 9 mm interpolar right renal cyst not requiring any further imaging evaluation follow-up. Stomach/Bowel: The stomach, duodenum, visualized small and visualized colon are grossly normal. Vascular/Lymphatic: The aorta and branch vessels are patent. The major venous structures are patent. No obvious abdominal adenopathy. Other: No ascites or abdominal wall hernia. No subcutaneous lesions. Musculoskeletal: No significant bony findings. IMPRESSION: 1. 2.8 x 1.8 cm hypoenhancing infiltrating lesion involving the lower pancreatic head accounting for the common bile duct and main pancreatic ductal dilatation. Findings consistent with pancreatic adenocarcinoma.  2. No obvious involvement of the major vascular structures. 3. No abdominal adenopathy or hepatic lesions to suggest metastatic disease. Electronically Signed   By: MYRTIS Stammer M.D.   On: 08/04/2023 23:39   US  ABDOMEN LIMITED RUQ (LIVER/GB) Result Date: 08/04/2023 CLINICAL DATA:  Upper abdominal pain, decreased appetite, weight loss for months following episode of nausea and vomiting in April. Elevated liver function tests, including alk-phos. EXAM: ULTRASOUND ABDOMEN LIMITED RIGHT UPPER QUADRANT COMPARISON:  None available FINDINGS: Gallbladder: Gallstones: None Sludge: None Gallbladder Wall: Present Pericholecystic fluid: None Sonographic Murphy's Sign: Negative per technologist Gallbladder is dilated measuring up to 13.9 x 5.4 x 4.5 cm Common bile duct: Diameter: 14 mm Liver: Parenchymal echogenicity: Within normal limits Contours: Normal Lesions: None Portal vein: Patent.  Hepatopetal flow Other: None. IMPRESSION: Dilated common bile duct and gallbladder suspicious for stricture, mass, or choledocholith obstructing the flow of bile in the distal common bile duct. Further evaluation with contrast enhanced abdominal MRI/MRCP or ERCP should be performed. Electronically Signed   By: Aliene Lloyd M.D.   On: 08/04/2023 08:39    ASSESSMENT: Pancreatic mass.  PLAN:    Pancreatic mass: Highly suspicious for  underlying malignancy.  Patient's CA 19-9 is greater than 7000.  Cytology from ERCP did not reveal malignant cells.  Patient will require PET scan to complete the staging workup as well as referral to [facility] for consideration of EUS for definitive diagnosis.  Patient will follow-up after her biopsy to discuss the results and treatment planning. Hyperbilirubinemia: Improving.  Patient has an external drain in place.  Follow-up with interventional radiology as scheduled. Hyponatremia: Patient's sodium level has declined to 129.  Monitor. Renal insufficiency: Mild, monitor. Transaminitis: Nearly resolved.   External drain as above. History of anemia: Per report patient has required iron infusions in the past, but her most recent hemoglobin is 14.2.  Patient expressed understanding and was in agreement with this plan. She also understands that She can call clinic at any time with any questions, concerns, or complaints.    Cancer Staging  No matching staging information was found for the patient.   Evalene JINNY Reusing, MD   08/12/2023 2:56 PM

## 2023-08-14 LAB — CANCER ANTIGEN 19-9: CA 19-9: 5172 U/mL — ABNORMAL HIGH (ref 0–35)

## 2023-08-15 ENCOUNTER — Ambulatory Visit

## 2023-08-15 ENCOUNTER — Inpatient Hospital Stay: Admitting: Internal Medicine

## 2023-08-15 ENCOUNTER — Inpatient Hospital Stay

## 2023-08-15 NOTE — Progress Notes (Signed)
 [facility] Clinical Social Work  Initial Assessment   Colleen [name] is a 78 y.o. year old female contacted by phone. Clinical Social Work was referred by medical provider for assessment of psychosocial needs.   SDOH (Social Determinants of Health) assessments performed: Yes SDOH Interventions    Flowsheet Row Clinical Support from 12/02/2022 in [facility] Telephone from 09/03/2021 in [facility] Chronic Care Management from 07/17/2020 in [facility] Clinical Support from 09/05/2018 in [facility]  SDOH Interventions      Food Insecurity Interventions Intervention Not Indicated -- -- --  Housing Interventions Intervention Not Indicated -- -- --  Transportation Interventions Intervention Not Indicated Patient Refused, Other (Comment)  [Patient stated she does not need transportation she is able to drive.  She needs wheelchair assitance once she arrives to [facility].] -- --  Utilities Interventions Intervention Not Indicated -- -- --  Alcohol Usage Interventions Intervention Not Indicated (Score <7) -- -- --  Depression Interventions/Treatment  PHQ2-9 Score <4 Follow-up Not Indicated -- -- PHQ2-9 Score <4 Follow-up Not Indicated  Financial Strain Interventions Intervention Not Indicated -- Intervention Not Indicated --  Physical Activity Interventions Intervention Not Indicated -- -- --  Stress Interventions Intervention Not Indicated -- -- --  Social Connections Interventions Intervention Not Indicated -- -- --  Health Literacy Interventions Intervention Not Indicated -- -- --    SDOH Screenings   Food Insecurity: Food Insecurity Present (08/12/2023)  Housing: Low Risk  (08/12/2023)  Transportation Needs: No Transportation Needs (08/12/2023)  Utilities: Not At Risk (08/12/2023)  Alcohol Screen: Low Risk  (12/02/2022)  Depression (PHQ2-9): Low Risk  (08/12/2023)  Financial Resource Strain: Low  Risk  (12/02/2022)  Physical Activity: Insufficiently Active (12/02/2022)  Social Connections: Socially Isolated (08/05/2023)  Stress: No Stress Concern Present (12/02/2022)  Tobacco Use: Medium Risk (08/12/2023)  Health Literacy: Adequate Health Literacy (12/02/2022)     Distress Screen completed: No     No data to display            Family/Social Information:  Housing Arrangement: patient lives alone Family members/support persons in your life? Family and Church.  Patient does not have any children.  Her niece is very supportive. Transportation concerns: no  Employment: Retired  Income source: Actor concerns: Yes, current concerns Type of concern: Medical bills Food access concerns: no Religious or spiritual practice: Yes-Christian and belongs to a church. Advanced directives: Yes-Patient said her niece gave the HCPOA to staff to scan into her chart. Services Currently in place:  Medicare  Coping/ Adjustment to diagnosis: Patient understands treatment plan and what happens next? yes Concerns about diagnosis and/or treatment: How I will pay for the services I need Patient reported stressors: Finances Hopes and/or priorities: To get her energy back. Patient enjoys time with family/ friends Current coping skills/ strengths: Capable of independent living , Manufacturing systems engineer , General fund of knowledge , Motivation for treatment/growth , Religious Affiliation , and Supportive family/friends     SUMMARY: Current SDOH Barriers:  Financial constraints related to fixed income.  Clinical Social Work Clinical Goal(s):  Explore community resource options for unmet needs related to:  Financial Strain   Interventions: Discussed common feeling and emotions when being diagnosed with cancer, and the importance of support during treatment Informed patient of the support team roles and support services at [facility] Provided CSW contact information and  encouraged patient to call with any questions or concerns  Provided patient with information about the [employer].  Mailed brochure and CSW contact information.  She expressed concerns about her bills.  CSW provided education regarding available assistance.    Follow Up Plan: CSW will follow-up with patient by phone  Patient verbalizes understanding of plan: Yes    Macario CHRISTELLA Au, LCSW Clinical Social Worker [facility]

## 2023-08-16 ENCOUNTER — Other Ambulatory Visit: Payer: Self-pay

## 2023-08-17 ENCOUNTER — Ambulatory Visit

## 2023-08-18 ENCOUNTER — Other Ambulatory Visit: Payer: Self-pay | Admitting: Family Medicine

## 2023-08-19 NOTE — Telephone Encounter (Signed)
 Requested medications are due for refill today.  yes  Requested medications are on the active medications list.  yes  Last refill. 04/19/2022 # 3 1 rf  Future visit scheduled.   yes  Notes to clinic.  Refill not delegated.    Requested Prescriptions  Pending Prescriptions Disp Refills   BREZTRI  AEROSPHERE 160-9-4.8 MCG/ACT AERO inhaler [Pharmacy Med Name: BREZTRI  160-9-4.8 MCG/ACT  AER] 33 g 0    Sig: INHALE 2 PUFFS TWICE DAILY     Off-Protocol Failed - 08/19/2023  3:54 PM      Failed - Medication not assigned to a protocol, review manually.      Passed - Valid encounter within last 12 months    Recent Outpatient Visits           2 weeks ago Elevated LFTs   [facility] Leavy Mole, PA-C   2 weeks ago Type 2 diabetes mellitus without complication, without long-term current use of insulin [facility])   [facility] Leavy Mole, PA-C   4 months ago Breast cancer screening by mammogram   [facility] Leavy Mole, PA-C       Future Appointments             In 3 weeks Tapia, Leisa, PA-C [facility], [facility]

## 2023-08-22 ENCOUNTER — Ambulatory Visit
Admission: RE | Admit: 2023-08-22 | Discharge: 2023-08-22 | Disposition: A | Discharge status: Home / Self Care | Source: Ambulatory Visit | Attending: Oncology | Admitting: Oncology

## 2023-08-22 ENCOUNTER — Ambulatory Visit

## 2023-08-22 DIAGNOSIS — K5730 Diverticulosis of large intestine without perforation or abscess without bleeding: Secondary | ICD-10-CM | POA: Insufficient documentation

## 2023-08-22 DIAGNOSIS — K828 Other specified diseases of gallbladder: Secondary | ICD-10-CM | POA: Diagnosis not present

## 2023-08-22 DIAGNOSIS — D259 Leiomyoma of uterus, unspecified: Secondary | ICD-10-CM | POA: Insufficient documentation

## 2023-08-22 DIAGNOSIS — R932 Abnormal findings on diagnostic imaging of liver and biliary tract: Secondary | ICD-10-CM | POA: Diagnosis not present

## 2023-08-22 DIAGNOSIS — K8689 Other specified diseases of pancreas: Secondary | ICD-10-CM | POA: Diagnosis not present

## 2023-08-22 LAB — GLUCOSE, CAPILLARY: Glucose-Capillary: 83 mg/dL (ref 70–99)

## 2023-08-22 MED ORDER — FLUDEOXYGLUCOSE F - 18 (FDG) INJECTION
9.1000 | Freq: Once | INTRAVENOUS | Status: AC | PRN
  Administered 2023-08-22: 9.14 via INTRAVENOUS

## 2023-08-24 ENCOUNTER — Ambulatory Visit: Admitting: Physical Therapy

## 2023-08-24 ENCOUNTER — Ambulatory Visit

## 2023-08-25 ENCOUNTER — Encounter: Payer: Self-pay | Admitting: Psychiatry

## 2023-08-25 DIAGNOSIS — N183 Chronic kidney disease, stage 3 unspecified: Secondary | ICD-10-CM | POA: Diagnosis not present

## 2023-08-25 DIAGNOSIS — Z79899 Other long term (current) drug therapy: Secondary | ICD-10-CM | POA: Diagnosis not present

## 2023-08-25 DIAGNOSIS — C259 Malignant neoplasm of pancreas, unspecified: Secondary | ICD-10-CM | POA: Diagnosis not present

## 2023-08-25 DIAGNOSIS — K449 Diaphragmatic hernia without obstruction or gangrene: Secondary | ICD-10-CM | POA: Diagnosis not present

## 2023-08-25 DIAGNOSIS — K831 Obstruction of bile duct: Secondary | ICD-10-CM | POA: Diagnosis not present

## 2023-08-25 DIAGNOSIS — I129 Hypertensive chronic kidney disease with stage 1 through stage 4 chronic kidney disease, or unspecified chronic kidney disease: Secondary | ICD-10-CM | POA: Diagnosis not present

## 2023-08-25 DIAGNOSIS — K8689 Other specified diseases of pancreas: Secondary | ICD-10-CM | POA: Diagnosis not present

## 2023-08-25 DIAGNOSIS — J449 Chronic obstructive pulmonary disease, unspecified: Secondary | ICD-10-CM | POA: Diagnosis not present

## 2023-08-25 DIAGNOSIS — Z7901 Long term (current) use of anticoagulants: Secondary | ICD-10-CM | POA: Diagnosis not present

## 2023-08-25 DIAGNOSIS — I4891 Unspecified atrial fibrillation: Secondary | ICD-10-CM | POA: Diagnosis not present

## 2023-08-25 DIAGNOSIS — Z9884 Bariatric surgery status: Secondary | ICD-10-CM | POA: Diagnosis not present

## 2023-08-25 DIAGNOSIS — R933 Abnormal findings on diagnostic imaging of other parts of digestive tract: Secondary | ICD-10-CM | POA: Diagnosis not present

## 2023-08-25 DIAGNOSIS — K838 Other specified diseases of biliary tract: Secondary | ICD-10-CM | POA: Diagnosis not present

## 2023-08-25 DIAGNOSIS — K8051 Calculus of bile duct without cholangitis or cholecystitis with obstruction: Secondary | ICD-10-CM | POA: Diagnosis not present

## 2023-08-25 DIAGNOSIS — K219 Gastro-esophageal reflux disease without esophagitis: Secondary | ICD-10-CM | POA: Diagnosis not present

## 2023-08-25 DIAGNOSIS — C250 Malignant neoplasm of head of pancreas: Secondary | ICD-10-CM | POA: Diagnosis not present

## 2023-08-25 DIAGNOSIS — E785 Hyperlipidemia, unspecified: Secondary | ICD-10-CM | POA: Diagnosis not present

## 2023-08-25 DIAGNOSIS — Z86711 Personal history of pulmonary embolism: Secondary | ICD-10-CM | POA: Diagnosis not present

## 2023-08-29 ENCOUNTER — Ambulatory Visit

## 2023-08-30 ENCOUNTER — Inpatient Hospital Stay

## 2023-08-30 ENCOUNTER — Inpatient Hospital Stay
Admission: EM | Admit: 2023-08-30 | Discharge: 2023-09-01 | DRG: 641 | Disposition: A | Discharge status: Home Health | Attending: Osteopathic Medicine | Admitting: Osteopathic Medicine

## 2023-08-30 ENCOUNTER — Inpatient Hospital Stay: Attending: Oncology | Admitting: Oncology

## 2023-08-30 ENCOUNTER — Other Ambulatory Visit: Payer: Self-pay

## 2023-08-30 VITALS — HR 153 | Temp 93.8°F | Resp 16 | Ht 64.0 in | Wt 138.0 lb

## 2023-08-30 DIAGNOSIS — Z83511 Family history of glaucoma: Secondary | ICD-10-CM

## 2023-08-30 DIAGNOSIS — Z86711 Personal history of pulmonary embolism: Secondary | ICD-10-CM | POA: Diagnosis not present

## 2023-08-30 DIAGNOSIS — Z7901 Long term (current) use of anticoagulants: Secondary | ICD-10-CM

## 2023-08-30 DIAGNOSIS — N179 Acute kidney failure, unspecified: Secondary | ICD-10-CM | POA: Diagnosis not present

## 2023-08-30 DIAGNOSIS — C259 Malignant neoplasm of pancreas, unspecified: Secondary | ICD-10-CM | POA: Insufficient documentation

## 2023-08-30 DIAGNOSIS — E785 Hyperlipidemia, unspecified: Secondary | ICD-10-CM | POA: Diagnosis present

## 2023-08-30 DIAGNOSIS — Z801 Family history of malignant neoplasm of trachea, bronchus and lung: Secondary | ICD-10-CM

## 2023-08-30 DIAGNOSIS — Z6825 Body mass index (BMI) 25.0-25.9, adult: Secondary | ICD-10-CM

## 2023-08-30 DIAGNOSIS — E876 Hypokalemia: Secondary | ICD-10-CM | POA: Diagnosis not present

## 2023-08-30 DIAGNOSIS — N1832 Chronic kidney disease, stage 3b: Secondary | ICD-10-CM | POA: Diagnosis present

## 2023-08-30 DIAGNOSIS — M48061 Spinal stenosis, lumbar region without neurogenic claudication: Secondary | ICD-10-CM | POA: Diagnosis not present

## 2023-08-30 DIAGNOSIS — J4489 Other specified chronic obstructive pulmonary disease: Secondary | ICD-10-CM | POA: Diagnosis present

## 2023-08-30 DIAGNOSIS — Z87891 Personal history of nicotine dependence: Secondary | ICD-10-CM

## 2023-08-30 DIAGNOSIS — M109 Gout, unspecified: Secondary | ICD-10-CM | POA: Diagnosis present

## 2023-08-30 DIAGNOSIS — E871 Hypo-osmolality and hyponatremia: Principal | ICD-10-CM | POA: Diagnosis present

## 2023-08-30 DIAGNOSIS — Z803 Family history of malignant neoplasm of breast: Secondary | ICD-10-CM

## 2023-08-30 DIAGNOSIS — Z833 Family history of diabetes mellitus: Secondary | ICD-10-CM | POA: Diagnosis not present

## 2023-08-30 DIAGNOSIS — D649 Anemia, unspecified: Secondary | ICD-10-CM | POA: Insufficient documentation

## 2023-08-30 DIAGNOSIS — R7303 Prediabetes: Secondary | ICD-10-CM | POA: Diagnosis present

## 2023-08-30 DIAGNOSIS — I4891 Unspecified atrial fibrillation: Secondary | ICD-10-CM | POA: Diagnosis not present

## 2023-08-30 DIAGNOSIS — C787 Secondary malignant neoplasm of liver and intrahepatic bile duct: Secondary | ICD-10-CM | POA: Insufficient documentation

## 2023-08-30 DIAGNOSIS — K8689 Other specified diseases of pancreas: Secondary | ICD-10-CM

## 2023-08-30 DIAGNOSIS — Z7951 Long term (current) use of inhaled steroids: Secondary | ICD-10-CM

## 2023-08-30 DIAGNOSIS — I2489 Other forms of acute ischemic heart disease: Secondary | ICD-10-CM | POA: Diagnosis not present

## 2023-08-30 DIAGNOSIS — D72829 Elevated white blood cell count, unspecified: Secondary | ICD-10-CM | POA: Diagnosis present

## 2023-08-30 DIAGNOSIS — E440 Moderate protein-calorie malnutrition: Secondary | ICD-10-CM | POA: Diagnosis not present

## 2023-08-30 DIAGNOSIS — K219 Gastro-esophageal reflux disease without esophagitis: Secondary | ICD-10-CM | POA: Diagnosis present

## 2023-08-30 DIAGNOSIS — Z79899 Other long term (current) drug therapy: Secondary | ICD-10-CM | POA: Diagnosis not present

## 2023-08-30 DIAGNOSIS — K5900 Constipation, unspecified: Secondary | ICD-10-CM | POA: Diagnosis not present

## 2023-08-30 DIAGNOSIS — Z5111 Encounter for antineoplastic chemotherapy: Secondary | ICD-10-CM | POA: Insufficient documentation

## 2023-08-30 DIAGNOSIS — I959 Hypotension, unspecified: Secondary | ICD-10-CM | POA: Diagnosis present

## 2023-08-30 DIAGNOSIS — I10 Essential (primary) hypertension: Secondary | ICD-10-CM | POA: Diagnosis present

## 2023-08-30 DIAGNOSIS — Z8249 Family history of ischemic heart disease and other diseases of the circulatory system: Secondary | ICD-10-CM

## 2023-08-30 LAB — CBC WITH DIFFERENTIAL/PLATELET
Abs Immature Granulocytes: 0.11 K/uL — ABNORMAL HIGH (ref 0.00–0.07)
Basophils Absolute: 0.1 K/uL (ref 0.0–0.1)
Basophils Relative: 0 %
Eosinophils Absolute: 0 K/uL (ref 0.0–0.5)
Eosinophils Relative: 0 %
HCT: 41 % (ref 36.0–46.0)
Hemoglobin: 14.2 g/dL (ref 12.0–15.0)
Immature Granulocytes: 1 %
Lymphocytes Relative: 21 %
Lymphs Abs: 3.6 K/uL (ref 0.7–4.0)
MCH: 30.8 pg (ref 26.0–34.0)
MCHC: 34.6 g/dL (ref 30.0–36.0)
MCV: 88.9 fL (ref 80.0–100.0)
Monocytes Absolute: 0.8 K/uL (ref 0.1–1.0)
Monocytes Relative: 4 %
Neutro Abs: 13 K/uL — ABNORMAL HIGH (ref 1.7–7.7)
Neutrophils Relative %: 74 %
Platelets: 344 K/uL (ref 150–400)
RBC: 4.61 MIL/uL (ref 3.87–5.11)
RDW: 13 % (ref 11.5–15.5)
WBC: 17.5 K/uL — ABNORMAL HIGH (ref 4.0–10.5)
nRBC: 0 % (ref 0.0–0.2)

## 2023-08-30 LAB — BASIC METABOLIC PANEL WITH GFR
Anion gap: 18 — ABNORMAL HIGH (ref 5–15)
Anion gap: 14 (ref 5–15)
BUN: 28 mg/dL — ABNORMAL HIGH (ref 8–23)
BUN: 28 mg/dL — ABNORMAL HIGH (ref 8–23)
CO2: 25 mmol/L (ref 22–32)
CO2: 26 mmol/L (ref 22–32)
Calcium: 9.4 mg/dL (ref 8.9–10.3)
Calcium: 9 mg/dL (ref 8.9–10.3)
Chloride: 81 mmol/L — ABNORMAL LOW (ref 98–111)
Chloride: 87 mmol/L — ABNORMAL LOW (ref 98–111)
Creatinine, Ser: 1.2 mg/dL — ABNORMAL HIGH (ref 0.44–1.00)
Creatinine, Ser: 1.06 mg/dL — ABNORMAL HIGH (ref 0.44–1.00)
GFR, Estimated: 47 mL/min — ABNORMAL LOW (ref >60)
GFR, Estimated: 54 mL/min — ABNORMAL LOW (ref >60)
Glucose, Bld: 128 mg/dL — ABNORMAL HIGH (ref 70–99)
Glucose, Bld: 102 mg/dL — ABNORMAL HIGH (ref 70–99)
Potassium: 3.4 mmol/L — ABNORMAL LOW (ref 3.5–5.1)
Potassium: 3 mmol/L — ABNORMAL LOW (ref 3.5–5.1)
Sodium: 124 mmol/L — ABNORMAL LOW (ref 135–145)
Sodium: 127 mmol/L — ABNORMAL LOW (ref 135–145)

## 2023-08-30 LAB — OSMOLALITY: Osmolality: 278 mosm/kg (ref 275–295)

## 2023-08-30 LAB — URINALYSIS, W/ REFLEX TO CULTURE (INFECTION SUSPECTED)
Bilirubin Urine: NEGATIVE
Glucose, UA: NEGATIVE mg/dL
Hgb urine dipstick: NEGATIVE
Ketones, ur: 5 mg/dL — AB
Leukocytes,Ua: NEGATIVE
Nitrite: NEGATIVE
Protein, ur: NEGATIVE mg/dL
Specific Gravity, Urine: 1.01 (ref 1.005–1.030)
pH: 6 (ref 5.0–8.0)

## 2023-08-30 LAB — MAGNESIUM: Magnesium: 1.9 mg/dL (ref 1.7–2.4)

## 2023-08-30 LAB — OSMOLALITY, URINE: Osmolality, Ur: 350 mosm/kg (ref 300–900)

## 2023-08-30 LAB — TROPONIN I (HIGH SENSITIVITY)
Troponin I (High Sensitivity): 47 ng/L — ABNORMAL HIGH (ref <18)
Troponin I (High Sensitivity): 48 ng/L — ABNORMAL HIGH (ref <18)

## 2023-08-30 LAB — HEPATIC FUNCTION PANEL
ALT: 23 U/L (ref 0–44)
AST: 40 U/L (ref 15–41)
Albumin: 3.3 g/dL — ABNORMAL LOW (ref 3.5–5.0)
Alkaline Phosphatase: 211 U/L — ABNORMAL HIGH (ref 38–126)
Bilirubin, Direct: 0.5 mg/dL — ABNORMAL HIGH (ref 0.0–0.2)
Indirect Bilirubin: 1.5 mg/dL — ABNORMAL HIGH (ref 0.3–0.9)
Total Bilirubin: 2 mg/dL — ABNORMAL HIGH (ref 0.0–1.2)
Total Protein: 7.3 g/dL (ref 6.5–8.1)

## 2023-08-30 LAB — PROCALCITONIN: Procalcitonin: 0.16 ng/mL

## 2023-08-30 LAB — SODIUM, URINE, RANDOM: Sodium, Ur: 18 mmol/L

## 2023-08-30 LAB — PHOSPHORUS: Phosphorus: 2.4 mg/dL — ABNORMAL LOW (ref 2.5–4.6)

## 2023-08-30 MED ORDER — HYDROMORPHONE HCL 1 MG/ML IJ SOLN: 0.5000 mg | INTRAMUSCULAR | Status: DC | PRN

## 2023-08-30 MED ORDER — POTASSIUM CHLORIDE 20 MEQ PO PACK
40.0000 meq | PACK | ORAL | Status: AC
  Administered 2023-08-30 (×2): 40 meq via ORAL
  Filled 2023-08-30 (×2): qty 2

## 2023-08-30 MED ORDER — ONDANSETRON HCL 4 MG PO TABS: 4.0000 mg | ORAL_TABLET | Freq: Four times a day (QID) | ORAL | Status: DC | PRN

## 2023-08-30 MED ORDER — SODIUM CHLORIDE 0.9% FLUSH
3.0000 mL | Freq: Two times a day (BID) | INTRAVENOUS | Status: DC
  Administered 2023-08-30 – 2023-09-01 (×3): 3 mL via INTRAVENOUS

## 2023-08-30 MED ORDER — SODIUM CHLORIDE 0.9% FLUSH
10.0000 mL | Freq: Every day | INTRAVENOUS | Status: DC
  Administered 2023-09-01: 10 mL via INTRAVENOUS

## 2023-08-30 MED ORDER — SODIUM CHLORIDE 0.9 % IV SOLN: INTRAVENOUS | Status: DC

## 2023-08-30 MED ORDER — POLYETHYLENE GLYCOL 3350 17 G PO PACK
17.0000 g | PACK | Freq: Two times a day (BID) | ORAL | Status: DC
  Administered 2023-08-31: 17 g via ORAL
  Filled 2023-08-30 (×5): qty 1

## 2023-08-30 MED ORDER — ACETAMINOPHEN 325 MG PO TABS: 650.0000 mg | ORAL_TABLET | Freq: Three times a day (TID) | ORAL | Status: DC | PRN

## 2023-08-30 MED ORDER — ALBUTEROL SULFATE (2.5 MG/3ML) 0.083% IN NEBU: 2.5000 mg | INHALATION_SOLUTION | RESPIRATORY_TRACT | Status: DC | PRN

## 2023-08-30 MED ORDER — ENOXAPARIN SODIUM 40 MG/0.4ML IJ SOSY: 40.0000 mg | PREFILLED_SYRINGE | INTRAMUSCULAR | Status: DC

## 2023-08-30 MED ORDER — SODIUM CHLORIDE 0.9 % IV SOLN: 250.0000 mL | INTRAVENOUS | Status: AC | PRN

## 2023-08-30 MED ORDER — ALLOPURINOL 100 MG PO TABS
200.0000 mg | ORAL_TABLET | Freq: Every day | ORAL | Status: DC
  Administered 2023-08-31 – 2023-09-01 (×2): 200 mg via ORAL
  Filled 2023-08-30 (×2): qty 2

## 2023-08-30 MED ORDER — BISACODYL 5 MG PO TBEC
5.0000 mg | DELAYED_RELEASE_TABLET | Freq: Every day | ORAL | Status: DC
  Filled 2023-08-30 (×2): qty 1

## 2023-08-30 MED ORDER — SIMETHICONE 80 MG PO CHEW: 80.0000 mg | CHEWABLE_TABLET | Freq: Four times a day (QID) | ORAL | Status: DC | PRN

## 2023-08-30 MED ORDER — SODIUM CHLORIDE 0.9 % IV BOLUS
1000.0000 mL | Freq: Once | INTRAVENOUS | Status: AC
  Administered 2023-08-30: 1000 mL via INTRAVENOUS

## 2023-08-30 MED ORDER — APIXABAN 2.5 MG PO TABS
2.5000 mg | ORAL_TABLET | Freq: Two times a day (BID) | ORAL | Status: DC
  Administered 2023-08-30 – 2023-09-01 (×4): 2.5 mg via ORAL
  Filled 2023-08-30 (×4): qty 1

## 2023-08-30 MED ORDER — SODIUM CHLORIDE 0.9% FLUSH: 3.0000 mL | INTRAVENOUS | Status: DC | PRN

## 2023-08-30 MED ORDER — ONDANSETRON HCL 4 MG/2ML IJ SOLN: 4.0000 mg | Freq: Four times a day (QID) | INTRAMUSCULAR | Status: DC | PRN

## 2023-08-30 MED ORDER — PANTOPRAZOLE SODIUM 40 MG PO TBEC
40.0000 mg | DELAYED_RELEASE_TABLET | Freq: Every day | ORAL | Status: DC
  Administered 2023-08-31 – 2023-09-01 (×2): 40 mg via ORAL
  Filled 2023-08-30 (×2): qty 1

## 2023-08-30 MED ORDER — SODIUM CHLORIDE 0.9% FLUSH
3.0000 mL | Freq: Two times a day (BID) | INTRAVENOUS | Status: DC
  Administered 2023-08-30 – 2023-09-01 (×5): 3 mL via INTRAVENOUS

## 2023-08-30 MED ORDER — SODIUM CHLORIDE 1 G PO TABS
2.0000 g | ORAL_TABLET | Freq: Two times a day (BID) | ORAL | Status: DC
  Administered 2023-08-30 – 2023-09-01 (×4): 2 g via ORAL
  Filled 2023-08-30 (×4): qty 2

## 2023-08-30 MED ORDER — POTASSIUM & SODIUM PHOSPHATES 280-160-250 MG PO PACK
2.0000 | PACK | Freq: Three times a day (TID) | ORAL | Status: AC
  Administered 2023-08-30 (×2): 2 via ORAL
  Filled 2023-08-30 (×2): qty 2

## 2023-08-30 MED ORDER — BUDESON-GLYCOPYRROL-FORMOTEROL 160-9-4.8 MCG/ACT IN AERO
2.0000 | INHALATION_SPRAY | Freq: Two times a day (BID) | RESPIRATORY_TRACT | Status: DC
  Filled 2023-08-30: qty 5.9

## 2023-08-30 NOTE — ED Notes (Signed)
 Pt given warm blanket.

## 2023-08-30 NOTE — Progress Notes (Signed)
 Nutrition  Patient not seen as taken to ER.    Krystine Pabst B. Dasie SOLON, CSO, LDN Registered Dietitian [phone]

## 2023-08-30 NOTE — H&P (Signed)
 [facility] History and Physical   Patient: Colleen [name] FMW:[id number]   PCP: Tapia, Leisa, PA-C DOB: [birth year]   DOA: 08/30/2023   DOS: 08/30/2023   DOS: the patient was seen and examined on 08/30/2023  Patient coming from: The patient is coming from Home  Chief Complaint: Weakness and presyncope, decreased oral intake  HPI: Colleen [name] is a 78 y.o. female with Past medical history of pancreatic Mass with liver mets, status post biliary stent insertion, A-fib and PE on Eliquis , HTN, HLD, pre-DM, COPD/asthma, CKD 3, gout, GERD, lumbar spinal stenosis, as reviewed from EMR, presented at [facility] ED with complaining of decreased oral intake for several days and feeling weak and tired.  Patient is feeling dizzy and unable to ambulate.  She woke up today and could not get out of the bed.  She went to physician office where she also passed out, had a presyncopal episode.  So patient was sent to the ED for further workup and management.   ED Course: VS hypotensive BP 67/32, improved after IV fluids. BMP: Hyponatremia sodium 124, K3.4, creatinine 1.2 Alk phos 211, TB 2.0, indirect bili 1.5 Troponin 47-48 remained flat, no chest pain CBC WBC 17.5 UA negative  TRH consulted for admission and further management as below  Review of Systems: as mentioned in the history of present illness.  All other systems reviewed and are negative.  Past Medical History:  Diagnosis Date   Asthma    Cataract    COPD (chronic obstructive pulmonary disease) (HCC)    GERD (gastroesophageal reflux disease)    Gout    History of pulmonary embolus (PE)    Hyperlipidemia    Hypertension    Osteoarthrosis    Prediabetes    Past Surgical History:  Procedure Laterality Date   BACK SURGERY     BREAST BIOPSY Left 04/08/2014   intraductal papilloma 5 mm   COLONOSCOPY WITH PROPOFOL  N/A 04/23/2020   Procedure: COLONOSCOPY WITH PROPOFOL ;  Surgeon: Janalyn Keene NOVAK, MD;  Location: [facility] ENDOSCOPY;  Service:  Endoscopy;  Laterality: N/A;   ERCP N/A 08/05/2023   Procedure: ERCP, WITH INTERVENTION IF INDICATED;  Surgeon: Jinny Carmine, MD;  Location: [facility] ENDOSCOPY;  Service: Endoscopy;  Laterality: N/A;   ESOPHAGOGASTRODUODENOSCOPY (EGD) WITH PROPOFOL  N/A 04/23/2020   Procedure: ESOPHAGOGASTRODUODENOSCOPY (EGD) WITH PROPOFOL ;  Surgeon: Janalyn Keene NOVAK, MD;  Location: [facility] ENDOSCOPY;  Service: Endoscopy;  Laterality: N/A;   GIVENS CAPSULE STUDY N/A 01/28/2022   Procedure: GIVENS CAPSULE STUDY;  Surgeon: Therisa Bi, MD;  Location: [facility] ENDOSCOPY;  Service: Gastroenterology;  Laterality: N/A;   IR INT EXT BILIARY DRAIN WITH CHOLANGIOGRAM  08/08/2023   LUMBAR LAMINECTOMY     TONSILLECTOMY     Social History:  reports that she quit smoking about 14 years ago. Her smoking use included cigarettes. She started smoking about 34 years ago. She has a 30 pack-year smoking history. She has never used smokeless tobacco. She reports that she does not currently use alcohol. She reports that she does not use drugs.  Allergies  Allergen Reactions   Nsaids     Other Reaction(s): Not available     Family history reviewed and not pertinent Family History  Problem Relation Age of Onset   Diabetes Mother    Lung cancer Father    Diabetes Sister    Congestive Heart Failure Sister    Glaucoma Sister    Breast cancer Maternal Aunt      Prior to Admission medications  Medication Sig Start Date End Date Taking? Authorizing Provider  acetaminophen  (TYLENOL ) 650 MG CR tablet Take 650 mg by mouth every 8 (eight) hours as needed for pain.    [provider]  albuterol  (VENTOLIN  HFA) 108 (90 Base) MCG/ACT inhaler Inhale 2 puffs into the lungs every 4 (four) hours as needed for wheezing or shortness of breath. 03/31/23   Tapia, Leisa, PA-C  allopurinol  (ZYLOPRIM ) 100 MG tablet Take 2 tablets (200 mg total) by mouth daily. 08/01/23   Tapia, Leisa, PA-C  apixaban  (ELIQUIS ) 2.5 MG TABS tablet Take 2.5 mg by mouth 2  (two) times daily.    [provider]  Blood Pressure Monitoring (ADULT BLOOD PRESSURE CUFF LG) KIT 1 each by Does not apply route daily. 09/01/21   Bernardo Fend, DO  BREZTRI  AEROSPHERE 160-9-4.8 MCG/ACT AERO inhaler INHALE 2 PUFFS TWICE DAILY 08/19/23   Tapia, Leisa, PA-C  cholecalciferol (VITAMIN D3) 25 MCG (1000 UNIT) tablet Take 1,000 Units by mouth daily.    [provider]  enalapril  (VASOTEC ) 2.5 MG tablet Take 1 tablet (2.5 mg total) by mouth daily. 10/05/22   Tapia, Leisa, PA-C  famotidine  (PEPCID ) 20 MG tablet Take 20 mg by mouth daily as needed for heartburn or indigestion. Patient not taking: Reported on 08/12/2023    [provider]  hydrochlorothiazide  (MICROZIDE ) 12.5 MG capsule Take 1 capsule (12.5 mg total) by mouth daily. 08/01/23   Tapia, Leisa, PA-C  Multiple Vitamins-Minerals (MULTIVITAMIN WOMEN 50+ PO) Take 1 tablet by mouth daily.    [provider]  ondansetron  (ZOFRAN -ODT) 4 MG disintegrating tablet Take 1 tablet (4 mg total) by mouth every 8 (eight) hours as needed for nausea or vomiting. 08/09/23   Dorinda Drue DASEN, MD  pantoprazole  (PROTONIX ) 40 MG tablet Take 1 tablet (40 mg total) by mouth daily. 08/01/23   Tapia, Leisa, PA-C  simethicone  (GAS-X) 80 MG chewable tablet Chew 1 tablet (80 mg total) by mouth 2 (two) times a day. 08/02/18 06/17/28  Dorothyann Drivers, MD  Sodium Chloride  Flush (NORMAL SALINE FLUSH) 0.9 % SOLN Inject 10 mLs into the vein daily. 08/09/23 10/08/23  Carim, Charles A, PA-C  triamcinolone  cream (KENALOG ) 0.1 % APPLY TO AFFECTED AREA TWICE DAILY IF NEEDED. NO MORE THAN 1 WEEK AT A TIME, THEN TAKE A BREAK FOR 2 WEEKS. 02/10/22   Bernardo Fend, DO    Physical Exam: Vitals:   08/30/23 1130 08/30/23 1200 08/30/23 1230 08/30/23 1300  BP: 93/64 104/62 107/70 112/66  Pulse:   88 88  Resp:  17 16 16   Temp:      TempSrc:      SpO2:   93% 100%    General: alert and oriented to time, place, and person. Appear in mild  distress, affect appropriate Eyes: PERRLA, Conjunctiva normal ENT: Oral Mucosa Clear, moist  Neck: no JVD, no Abnormal Mass Or lumps Cardiovascular: S1 and S2 Present, no Murmur, peripheral pulses symmetrical Respiratory: good respiratory effort, Bilateral Air entry equal and Decreased, no signs of accessory muscle use, Clear to Auscultation, no Crackles, no wheezes Abdomen: Bowel Sound present, Soft and no tenderness, no hernia, percutaneous biliary drain catheter intact.  Bag was removed as per patient Skin: no rashes  Extremities: no Pedal edema, no calf tenderness Neurologic: without any new focal findings Gait not checked due to patient safety concerns  Data Reviewed: I have personally reviewed and interpreted labs, imaging as discussed below.  CBC: Recent Labs  Lab 08/30/23 1145  WBC 17.5*  NEUTROABS  13.0*  HGB 14.2  HCT 41.0  MCV 88.9  PLT 344   Basic Metabolic Panel: Recent Labs  Lab 08/30/23 1145  NA 124*  K 3.4*  CL 81*  CO2 25  GLUCOSE 128*  BUN 28*  CREATININE 1.20*  CALCIUM  9.4  MG 1.9   GFR: Estimated Creatinine Clearance: 33.9 mL/min (A) (by C-G formula based on SCr of 1.2 mg/dL (H)). Liver Function Tests: Recent Labs  Lab 08/30/23 1145  AST 40  ALT 23  ALKPHOS 211*  BILITOT 2.0*  PROT 7.3  ALBUMIN 3.3*   No results for input(s): LIPASE, AMYLASE in the last 168 hours. No results for input(s): AMMONIA in the last 168 hours. Coagulation Profile: No results for input(s): INR, PROTIME in the last 168 hours. Cardiac Enzymes: No results for input(s): CKTOTAL, CKMB, CKMBINDEX, TROPONINI in the last 168 hours. BNP (last 3 results) No results for input(s): PROBNP in the last 8760 hours. HbA1C: No results for input(s): HGBA1C in the last 72 hours. CBG: No results for input(s): GLUCAP in the last 168 hours. Lipid Profile: No results for input(s): CHOL, HDL, LDLCALC, TRIG, CHOLHDL, LDLDIRECT in the last 72  hours. Thyroid  Function Tests: No results for input(s): TSH, T4TOTAL, FREET4, T3FREE, THYROIDAB in the last 72 hours. Anemia Panel: No results for input(s): VITAMINB12, FOLATE, FERRITIN, TIBC, IRON, RETICCTPCT in the last 72 hours. Urine analysis:    Component Value Date/Time   COLORURINE DARK YELLOW 05/14/2022 1113   APPEARANCEUR CLOUDY (A) 05/14/2022 1113   LABSPEC 1.023 05/14/2022 1113   PHURINE 6.0 05/14/2022 1113   GLUCOSEU NEGATIVE 05/14/2022 1113   HGBUR NEGATIVE 05/14/2022 1113   BILIRUBINUR negative 07/26/2022 1046   KETONESUR NEGATIVE 05/14/2022 1113   PROTEINUR Positive (A) 07/26/2022 1046   PROTEINUR TRACE (A) 05/14/2022 1113   UROBILINOGEN 0.2 07/26/2022 1046   NITRITE small 07/26/2022 1046   NITRITE POSITIVE (A) 05/14/2022 1113   LEUKOCYTESUR Trace (A) 07/26/2022 1046   LEUKOCYTESUR 2+ (A) 05/14/2022 1113    Radiological Exams on Admission: No results found.  EKG: Independently reviewed.  Sinus tach irregular rhythm HR 132   I reviewed all nursing notes, pharmacy notes, vitals, pertinent old records.  Assessment/Plan Principal Problem:   Hyponatremia  # Hyponatremia, most likely due to nutritional deficiency Sodium 124 >> 127 after NS IV fluid given in the ED Serum osmolality 278 WNL Urine osmolality 350, urine sodium 80 Continue IV fluid for hydration Na Correction target 8 to 10 mmol/L/24 hrs  Started salt tablets 2 g p.o. twice daily Monitor sodium level daily   # Hypotension # Presented with presyncope Mildly elevated troponin most likely due to demand ischemia BP improved after IV fluid given in the ED Continue IV fluid for hydration BP improved, continue to monitor Held antihypertensive medications for now Resume when needed, may DC hydrochlorothiazide  on discharge to prevent electrolyte imbalance in future  # Hypokalemia, potassium repleted. # Hypophosphatemia, Phos repleted. Monitor electrolytes and replete as  needed.  # Leukocytosis most likely reactive, procalcitonin negative Monitor off antibiotics for now  # Pancreatic mass with biliary stenosis status post percutaneous biliary stent insertion.  Catheter is intact. Patient is following oncology as an outpatient for No active issues.   A-fib and PE on Eliquis , HTN, HLD Resumed Eliquis  home dose Held antihypertensive medication for now due to hypotension and electrolyte imbalance Held home meds enalapril  and HCTZ   pre-DM, BG stable, not on any medication   COPD/asthma, no exacerbation on exam Resumed home inhalers  CKD 3,  Gout; resume allopurinol   GERD: Resumed PPI  Lumbar spinal stenosis: prn pain meds  Constipation: Continue laxatives  Nutrition: Regular diet DVT Prophylaxis: Eliquis   Advance goals of care discussion: Full code   Consults: none  Family Communication: family was not present at bedside, at the time of interview.  Opportunity was given to ask question and all questions were answered satisfactorily.  Disposition: Admitted as inpatient, telemetry, med-surge unit. Likely to be discharged home, in 2-3 days when stable.  I have discussed plan of care as described above with RN and patient/family.  Severity of Illness: The appropriate patient status for this patient is INPATIENT. Inpatient status is judged to be reasonable and necessary in order to provide the required intensity of service to ensure the patient's safety. The patient's presenting symptoms, physical exam findings, and initial radiographic and laboratory data in the context of their chronic comorbidities is felt to place them at high risk for further clinical deterioration. Furthermore, it is not anticipated that the patient will be medically stable for discharge from the hospital within 2 midnights of admission.   * I certify that at the point of admission it is my clinical judgment that the patient will require inpatient hospital care spanning  beyond 2 midnights from the point of admission due to high intensity of service, high risk for further deterioration and high frequency of surveillance required.*   Author: ELVAN SOR, MD [facility] 08/30/2023 3:04 PM   To reach On-call, see care teams to locate the attending and reach out to them via [email or web address]. If 7PM-7AM, please contact night-coverage If you still have difficulty reaching the attending provider, please page the [facility] (Director on Call) for [facility] on amion for assistance.

## 2023-08-30 NOTE — ED Triage Notes (Signed)
 Pt niece reports pt having increase weakness, decreased appetite, and episodes of shaking and AMS. Pt was being seen at the cancer center this morning where her BP dropped and pt was nonverbal. Pt is independent at baseline until last week when she began to not get out of bed.

## 2023-08-30 NOTE — ED Provider Notes (Signed)
 [facility] Provider Note    Event Date/Time   First MD Initiated Contact with Patient 08/30/23 1129     (approximate)   History   Weakness   HPI  Colleen [name] is a 78 year old female with history of recently identified pancreatic adenocarcinoma, HTN, COPD, A-fib and PE on Eliquis  presenting to the emergency department for evaluation of weakness.  Patient discharged from our hospital on 7/15.  Family reports that she was doing well for about a week, but over the last week has had significant generalized weakness.  Patient reports poor p.o. intake.  No abdominal pain, but reports nausea when attempting to eat.  No fevers, cough, dysuria.  Today, had a witnessed syncopal episode when trying to get up from bed on her way to clinic appointment.  In the clinic, had second syncopal episode when trying to stand up, found to be hypotensive.    Physical Exam   Triage Vital Signs: ED Triage Vitals [08/30/23 1122]  Encounter Vitals Group     BP (!) 67/32     Girls Systolic BP Percentile      Girls Diastolic BP Percentile      Boys Systolic BP Percentile      Boys Diastolic BP Percentile      Pulse Rate 100     Resp 16     Temp 97.6 F (36.4 C)     Temp Source Oral     SpO2 95 %     Weight      Height      Head Circumference      Peak Flow      Pain Score      Pain Loc      Pain Education      Exclude from Growth Chart     Most recent vital signs: Vitals:   08/30/23 1230 08/30/23 1300  BP: 107/70 112/66  Pulse: 88 88  Resp: 16 16  Temp:    SpO2: 93% 100%     General: Awake, interactive CV:  Regular rate, good peripheral perfusion.  Resp:  Unlabored respirations.  Lungs good auscultation Abd:  Nondistended, soft, nontender, abdominal drain in place with clean surrounding dressing Neuro:  Symmetric facial movement, fluid speech, somnolent but arousable   ED Results / Procedures / Treatments   Labs (all labs ordered are listed, but only  abnormal results are displayed) Labs Reviewed  CBC WITH DIFFERENTIAL/PLATELET - Abnormal; Notable for the following components:      Result Value   WBC 17.5 (*)    Neutro Abs 13.0 (*)    Abs Immature Granulocytes 0.11 (*)    All other components within normal limits  BASIC METABOLIC PANEL WITH GFR - Abnormal; Notable for the following components:   Sodium 124 (*)    Potassium 3.4 (*)    Chloride 81 (*)    Glucose, Bld 128 (*)    BUN 28 (*)    Creatinine, Ser 1.20 (*)    GFR, Estimated 47 (*)    Anion gap 18 (*)    All other components within normal limits  HEPATIC FUNCTION PANEL - Abnormal; Notable for the following components:   Albumin 3.3 (*)    Alkaline Phosphatase 211 (*)    Total Bilirubin 2.0 (*)    Bilirubin, Direct 0.5 (*)    Indirect Bilirubin 1.5 (*)    All other components within normal limits  TROPONIN I (HIGH SENSITIVITY) - Abnormal; Notable for the following components:  Troponin I (High Sensitivity) 47 (*)    All other components within normal limits  MAGNESIUM  URINALYSIS, W/ REFLEX TO CULTURE (INFECTION SUSPECTED)  TROPONIN I (HIGH SENSITIVITY)     EKG EKG independently reviewed and interpreted by myself demonstrates:  EKG demonstrates suspected A-fib at a rate of 132, PR 150, QRS 92, QTc 468, no acute ST changes  RADIOLOGY Imaging independently reviewed and interpreted by myself demonstrates:    Formal Radiology Read:  No results found.  PROCEDURES:  Critical Care performed: No  Procedures   MEDICATIONS ORDERED IN ED: Medications  sodium chloride  0.9 % bolus 1,000 mL (0 mLs Intravenous Stopped 08/30/23 1246)     IMPRESSION / MDM / ASSESSMENT AND PLAN / ED COURSE  I reviewed the triage vital signs and the nursing notes.  Differential diagnosis includes, but is not limited to, dehydration, anemia, electrolyte abnormality, weakness associated with known malignancy  Patient's presentation is most consistent with acute presentation with  potential threat to life or bodily function.  78 year old female presenting to the emergency department for evaluation of weakness and poor p.o. intake.  Hypotensive on presentation, improved without intervention.  Given IV fluids with distant improved blood pressure.  Labs with leukocytosis of 17.5, new for patient though no obvious infectious source.  BMP with worsening hyponatremia with sodium of 124, AKI with creatinine of 1.2, baseline around 0.6.  LFTs remain stable to improved.  Suspect patient's weakness likely related to poor p.o. intake with likely hypovolemic hyponatremia.  Already received a liter of IV fluids, will hold off on further fluids for now.  Do think she is appropriate for admission given her hyponatremia and weakness.  Will reach out to hospitalist team.  Case discussed with hospitalist team.  They will evaluate for anticipated admission.      FINAL CLINICAL IMPRESSION(S) / ED DIAGNOSES   Final diagnoses:  Hyponatremia  Acute kidney injury (HCC)     Rx / DC Orders   ED Discharge Orders     None        Note:  This document was prepared using Dragon voice recognition software and may include unintentional dictation errors.   Levander Slate, MD 08/30/23 [phone]

## 2023-08-31 ENCOUNTER — Ambulatory Visit

## 2023-08-31 DIAGNOSIS — E871 Hypo-osmolality and hyponatremia: Secondary | ICD-10-CM | POA: Diagnosis not present

## 2023-08-31 LAB — CBC
HCT: 35.5 % — ABNORMAL LOW (ref 36.0–46.0)
Hemoglobin: 12.1 g/dL (ref 12.0–15.0)
MCH: 30.5 pg (ref 26.0–34.0)
MCHC: 34.1 g/dL (ref 30.0–36.0)
MCV: 89.4 fL (ref 80.0–100.0)
Platelets: 279 K/uL (ref 150–400)
RBC: 3.97 MIL/uL (ref 3.87–5.11)
RDW: 12.9 % (ref 11.5–15.5)
WBC: 13.4 K/uL — ABNORMAL HIGH (ref 4.0–10.5)
nRBC: 0 % (ref 0.0–0.2)

## 2023-08-31 LAB — BASIC METABOLIC PANEL WITH GFR
Anion gap: 7 (ref 5–15)
BUN: 25 mg/dL — ABNORMAL HIGH (ref 8–23)
CO2: 25 mmol/L (ref 22–32)
Calcium: 8.7 mg/dL — ABNORMAL LOW (ref 8.9–10.3)
Chloride: 97 mmol/L — ABNORMAL LOW (ref 98–111)
Creatinine, Ser: 0.74 mg/dL (ref 0.44–1.00)
GFR, Estimated: >60 mL/min (ref >60)
Glucose, Bld: 92 mg/dL (ref 70–99)
Potassium: 3.7 mmol/L (ref 3.5–5.1)
Sodium: 129 mmol/L — ABNORMAL LOW (ref 135–145)

## 2023-08-31 LAB — HEPATIC FUNCTION PANEL
ALT: 19 U/L (ref 0–44)
AST: 25 U/L (ref 15–41)
Albumin: 2.9 g/dL — ABNORMAL LOW (ref 3.5–5.0)
Alkaline Phosphatase: 176 U/L — ABNORMAL HIGH (ref 38–126)
Bilirubin, Direct: 0.3 mg/dL — ABNORMAL HIGH (ref 0.0–0.2)
Indirect Bilirubin: 0.9 mg/dL (ref 0.3–0.9)
Total Bilirubin: 1.2 mg/dL (ref 0.0–1.2)
Total Protein: 6.3 g/dL — ABNORMAL LOW (ref 6.5–8.1)

## 2023-08-31 LAB — MAGNESIUM: Magnesium: 1.8 mg/dL (ref 1.7–2.4)

## 2023-08-31 LAB — PHOSPHORUS: Phosphorus: 2 mg/dL — ABNORMAL LOW (ref 2.5–4.6)

## 2023-08-31 LAB — GLUCOSE, CAPILLARY: Glucose-Capillary: 79 mg/dL (ref 70–99)

## 2023-08-31 MED ORDER — MEGESTROL ACETATE 20 MG PO TABS
20.0000 mg | ORAL_TABLET | Freq: Two times a day (BID) | ORAL | Status: DC
  Administered 2023-08-31 – 2023-09-01 (×2): 20 mg via ORAL
  Filled 2023-08-31 (×2): qty 1

## 2023-08-31 MED ORDER — ENSURE PLUS HIGH PROTEIN PO LIQD
237.0000 mL | Freq: Two times a day (BID) | ORAL | Status: DC
  Administered 2023-09-01: 237 mL via ORAL

## 2023-08-31 NOTE — Evaluation (Signed)
 Physical Therapy Evaluation Patient Details Name: Colleen [name] MRN: [id number] DOB: 1945-11-21 Today's Date: 08/31/2023  History of Present Illness  78 y.o. female with Past medical history of pancreatic Mass with liver mets, status post biliary stent insertion, A-fib and PE on Eliquis , HTN, HLD, pre-DM, COPD/asthma, CKD 3, gout, GERD, lumbar spinal stenosis, as reviewed from EMR, presented at [facility] ED with complaining of decreased oral intake for several days and feeling weak and tired.  Patient is feeling dizzy and unable to ambulate.  She woke up today and could not get out of the bed.  She went to physician office where she also passed out, had a presyncopal episode.   Clinical Impression  Pt admitted with above diagnosis. Pt currently with functional limitations due to the deficits listed below (see PT Problem List). Pt received upright in bed agreeable to PT. Typically mod-I with her SPC with gait. Niece assists some with IADL's but is independent with ADL's.   To date, pt exits bed at supervision with bed features. STS at supervision to RW with safe hand placement completing ~100' of step through gait. Some chronic RLE ROM impairments but pt reports this is baseline since birth. Pt returns upright in bed with assist for lunch tray set up. Anticipate pt is close to baseline. Will benefit for PT f/u to assess tolerance for stair training to enter/exit home. Pt will benefit from skilled PT services to address acute deficits in strength to maximize return to PLOF and reduced falls risk.       If plan is discharge home, recommend the following: A little help with bathing/dressing/bathroom;Assistance with cooking/housework;Assist for transportation;Help with stairs or ramp for entrance   Can travel by private vehicle        Equipment Recommendations None recommended by PT  Recommendations for Other Services       Functional Status Assessment Patient has had a recent decline in their  functional status and demonstrates the ability to make significant improvements in function in a reasonable and predictable amount of time.     Precautions / Restrictions Precautions Precautions: Fall Recall of Precautions/Restrictions: Intact Restrictions Weight Bearing Restrictions Per Provider Order: No      Mobility  Bed Mobility Overal bed mobility: Needs Assistance Bed Mobility: Supine to Sit, Sit to Supine     Supine to sit: Supervision Sit to supine: Supervision     Patient Response: Cooperative  Transfers Overall transfer level: Needs assistance Equipment used: Rolling walker (2 wheels) Transfers: Sit to/from Stand Sit to Stand: Supervision           General transfer comment: safe hand placement    Ambulation/Gait Ambulation/Gait assistance: Supervision Gait Distance (Feet): 100 Feet Assistive device: Rolling walker (2 wheels) Gait Pattern/deviations: Step-through pattern, Narrow base of support, Trunk flexed       General Gait Details: R hip internally rotated but pt reports this is baseline. Min VC's for upright posture, staying closer to RW.  Stairs            Wheelchair Mobility     Tilt Bed Tilt Bed Patient Response: Cooperative  Modified Rankin (Stroke Patients Only)       Balance Overall balance assessment: Needs assistance Sitting-balance support: Feet supported Sitting balance-Leahy Scale: Good     Standing balance support: During functional activity, Bilateral upper extremity supported, Single extremity supported Standing balance-Leahy Scale: Fair  Pertinent Vitals/Pain Pain Assessment Pain Assessment: No/denies pain    Home Living Family/patient expects to be discharged to:: Private residence Living Arrangements: Alone Available Help at Discharge: Family;Available PRN/intermittently Type of Home: House Home Access: Stairs to enter Entrance Stairs-Rails: Right Entrance  Stairs-Number of Steps: 4   Home Layout: One level Home Equipment: Agricultural consultant (2 wheels);Cane - single point;Tub bench      Prior Function Prior Level of Function : Independent/Modified Independent               ADLs Comments: Pt is Ind in self care and IADLs. She no longer drives but niece helps get appts and get groceries     Extremity/Trunk Assessment   Upper Extremity Assessment Upper Extremity Assessment: Generalized weakness    Lower Extremity Assessment Lower Extremity Assessment: Generalized weakness    Cervical / Trunk Assessment Cervical / Trunk Assessment: Normal  Communication   Communication Communication: No apparent difficulties    Cognition Arousal: Alert Behavior During Therapy: WFL for tasks assessed/performed   PT - Cognitive impairments: No apparent impairments                         Following commands: Intact       Cueing Cueing Techniques: Verbal cues     General Comments      Exercises Other Exercises Other Exercises: Role of PT in acute setting, d/c recs, safe use of AD for falls prevention in setting of acute weakness.   Assessment/Plan    PT Assessment Patient needs continued PT services  PT Problem List Decreased strength;Decreased mobility;Decreased activity tolerance;Decreased balance       PT Treatment Interventions DME instruction;Therapeutic exercise;Gait training;Balance training;Stair training;Neuromuscular re-education;Functional mobility training;Therapeutic activities;Patient/family education    PT Goals (Current goals can be found in the Care Plan section)  Acute Rehab PT Goals Patient Stated Goal: improve strength and go home PT Goal Formulation: With patient Time For Goal Achievement: 09/14/23 Potential to Achieve Goals: Good    Frequency Min 2X/week     Co-evaluation               AM-PAC PT 6 Clicks Mobility  Outcome Measure Help needed turning from your back to your side while  in a flat bed without using bedrails?: A Little Help needed moving from lying on your back to sitting on the side of a flat bed without using bedrails?: A Little Help needed moving to and from a bed to a chair (including a wheelchair)?: A Little Help needed standing up from a chair using your arms (e.g., wheelchair or bedside chair)?: A Little Help needed to walk in hospital room?: A Little Help needed climbing 3-5 steps with a railing? : A Little 6 Click Score: 18    End of Session Equipment Utilized During Treatment: Gait belt Activity Tolerance: Patient tolerated treatment well Patient left: in bed;with call bell/phone within reach;with bed alarm set Nurse Communication: Mobility status PT Visit Diagnosis: Other abnormalities of gait and mobility (R26.89);Muscle weakness (generalized) (M62.81)    Time: 8685-8666 PT Time Calculation (min) (ACUTE ONLY): 19 min   Charges:   PT Evaluation $PT Eval Low Complexity: 1 Low   PT General Charges $$ ACUTE PT VISIT: 1 Visit       Dorina HERO. Fairly IV, PT, DPT Physical Therapist- [place]  [facility] 08/31/2023, 2:36 PM

## 2023-08-31 NOTE — Progress Notes (Addendum)
 PROGRESS NOTE    Colleen [name]   FMW:[id number] DOB: 1945-09-05  DOA: 08/30/2023 Date of Service: 08/31/23 which is hospital day 1  PCP: Leavy Mole, [facility] Hospital course / significant events:   HPI: Colleen [name] is a 78 y.o. female with Past medical history of pancreatic Mass with liver mets, status post biliary stent insertion, A-fib and PE on Eliquis , HTN, HLD, pre-DM, COPD/asthma, CKD 3, gout, GERD, lumbar spinal stenosis, as reviewed from EMR, presented at [facility] ED with complaining of decreased oral intake for several days and feeling weak and tired.  Patient is feeling dizzy and unable to ambulate. Sent to ED from oncology office w/ tachycardia and presyncope / AMS with reduced responsiveness  08/05: to ED, hypotensive BP 67/32, improved after IV fluids. Hyponatremia sodium 124. WBC 17.5. Admitted to hospitalist 08/06: sodium improving, d/c IV NS this afternoon and continue to monitor       Consultants:  none  Procedures/Surgeries: none      ASSESSMENT & PLAN:   Hyponatremia Poor po intake, question dehydration / salt deficient diet. Also on hydrochlorothiazide   Sodium 124 >> 127 after NS IV fluid given in the ED Serum osmolality 278 WNL Urine osmolality 350, urine sodium 80 Continue IV fluid for hydration, to dc this afternoon  Na Correction target 8 to 10 mmol/L/24 hrs  Started salt tablets 2 g p.o. twice daily Monitor sodium level closely    Hypotension Presented with presyncope Essential HTN on antihypertensive  Mildly elevated troponin most likely due to demand ischemia BP improved after IV fluid given in the ED Continue IV fluid for hydration, to dc this afternoon  Held antihypertensive medications for now Resume as needed, DC hydrochlorothiazide  on discharge to prevent electrolyte imbalance in future   Hypokalemia, potassium repleted. Hypophosphatemia, Phos repleted. Monitor electrolytes and replete as needed.   Leukocytosis most likely  reactive, procalcitonin negative Monitor off antibiotics for now   Pancreatic mass with biliary stenosis status post percutaneous biliary stent insertion.  Catheter is intact. Patient is following oncology as an outpatient for No active issues.    A-fib and PE on Eliquis ,  Resumed Eliquis  home dose   pre-DM, BG stable, not on any medication Follow outpatient    COPD/asthma, no exacerbation on exam Resumed home inhalers   CKD 3 Monitor BMP   Gout;  allopurinol    GERD PPI   Lumbar spinal stenosis:  prn pain meds   Constipation:  Prn laxatives      overweight based on BMI: Body mass index is 25.75 kg/m.SABRA Significantly low or high BMI is associated with higher medical risk.  Underweight - under 18  overweight - 25 to 29 obese - 30 or more Class 1 obesity: BMI of 30.0 to 34 Class 2 obesity: BMI of 35.0 to 39 Class 3 obesity: BMI of 40.0 to 49 Super Morbid Obesity: BMI 50-59 Super-super Morbid Obesity: BMI 60+ Healthy nutrition and physical activity advised as adjunct to other disease management and risk reduction treatments    DVT prophylaxis: eliquis  IV fluids: dc continuous IV fluids  Nutrition: regular diet Central lines / other devices: none  Code Status: FULL CODE ACP documentation reviewed: none on file in [facility]YNCA  TOC needs: home health Medical barriers to dispo: hyponatremia, hypotension . Expected medical readiness for discharge tomorrow.              Subjective / Brief ROS:  Patient reports poor appetite and asks about any appetite stimulant option Denies  CP/SOB.  Pain controlled.  Denies new weakness.  Tolerating diet.  Reports no concerns w/ urination/defecation.   Family Communication: will call later this afternoon     Objective Findings:  Vitals:   08/31/23 0010 08/31/23 0407 08/31/23 0850 08/31/23 1214  BP: (!) 101/55 (!) 94/56 91/66 98/62   Pulse: 65 64 75 78  Resp:      Temp: 97.6 F (36.4 C) (!) 97.5 F (36.4 C) 98  F (36.7 C) 98.1 F (36.7 C)  TempSrc: Oral Oral Axillary   SpO2: 100% 100% 99% 99%  Weight:      Height:        Intake/Output Summary (Last 24 hours) at 08/31/2023 1502 Last data filed at 08/31/2023 0452 Gross per 24 hour  Intake 1243.93 ml  Output 0 ml  Net 1243.93 ml   Filed Weights   08/30/23 1701  Weight: 68 kg    Examination:  Physical Exam Constitutional:      General: She is not in acute distress. Cardiovascular:     Rate and Rhythm: Normal rate. Rhythm irregular.  Pulmonary:     Effort: Pulmonary effort is normal.     Breath sounds: Normal breath sounds.  Abdominal:     General: Abdomen is flat.     Palpations: Abdomen is soft.  Neurological:     Mental Status: She is alert.  Psychiatric:        Mood and Affect: Mood normal.        Behavior: Behavior normal.          Scheduled Medications:   allopurinol   200 mg Oral Daily   apixaban   2.5 mg Oral BID   bisacodyl   5 mg Oral QHS   budesonide -glycopyrrolate -formoterol   2 puff Inhalation BID   megestrol   20 mg Oral BID   pantoprazole   40 mg Oral Daily   polyethylene glycol  17 g Oral BID   sodium chloride  flush  10 mL Intravenous Daily   sodium chloride  flush  3 mL Intravenous Q12H   sodium chloride  flush  3 mL Intravenous Q12H   sodium chloride   2 g Oral BID WC    Continuous Infusions:   PRN Medications:  acetaminophen , albuterol , ondansetron  **OR** ondansetron  (ZOFRAN ) IV, simethicone , sodium chloride  flush  Antimicrobials from admission:  Anti-infectives (From admission, onward)    None           Data Reviewed:  I have personally reviewed the following...  CBC: Recent Labs  Lab 08/30/23 1145 08/31/23 0348  WBC 17.5* 13.4*  NEUTROABS 13.0*  --   HGB 14.2 12.1  HCT 41.0 35.5*  MCV 88.9 89.4  PLT 344 279   Basic Metabolic Panel: Recent Labs  Lab 08/30/23 1145 08/30/23 1524 08/31/23 0348  NA 124* 127* 129*  K 3.4* 3.0* 3.7  CL 81* 87* 97*  CO2 25 26 25   GLUCOSE 128*  102* 92  BUN 28* 28* 25*  CREATININE 1.20* 1.06* 0.74  CALCIUM  9.4 9.0 8.7*  MG 1.9  --  1.8  PHOS  --  2.4* 2.0*   GFR: Estimated Creatinine Clearance: 55.8 mL/min (by C-G formula based on SCr of 0.74 mg/dL). Liver Function Tests: Recent Labs  Lab 08/30/23 1145 08/31/23 0348  AST 40 25  ALT 23 19  ALKPHOS 211* 176*  BILITOT 2.0* 1.2  PROT 7.3 6.3*  ALBUMIN 3.3* 2.9*   No results for input(s): LIPASE, AMYLASE in the last 168 hours. No results for input(s): AMMONIA in the last 168 hours.  Coagulation Profile: No results for input(s): INR, PROTIME in the last 168 hours. Cardiac Enzymes: No results for input(s): CKTOTAL, CKMB, CKMBINDEX, TROPONINI in the last 168 hours. BNP (last 3 results) No results for input(s): PROBNP in the last 8760 hours. HbA1C: No results for input(s): HGBA1C in the last 72 hours. CBG: Recent Labs  Lab 08/31/23 0800  GLUCAP 79   Lipid Profile: No results for input(s): CHOL, HDL, LDLCALC, TRIG, CHOLHDL, LDLDIRECT in the last 72 hours. Thyroid  Function Tests: No results for input(s): TSH, T4TOTAL, FREET4, T3FREE, THYROIDAB in the last 72 hours. Anemia Panel: No results for input(s): VITAMINB12, FOLATE, FERRITIN, TIBC, IRON, RETICCTPCT in the last 72 hours. Most Recent Urinalysis On File:     Component Value Date/Time   COLORURINE YELLOW (A) 08/30/2023 1539   APPEARANCEUR CLEAR (A) 08/30/2023 1539   LABSPEC 1.010 08/30/2023 1539   PHURINE 6.0 08/30/2023 1539   GLUCOSEU NEGATIVE 08/30/2023 1539   HGBUR NEGATIVE 08/30/2023 1539   BILIRUBINUR NEGATIVE 08/30/2023 1539   BILIRUBINUR negative 07/26/2022 1046   KETONESUR 5 (A) 08/30/2023 1539   PROTEINUR NEGATIVE 08/30/2023 1539   UROBILINOGEN 0.2 07/26/2022 1046   NITRITE NEGATIVE 08/30/2023 1539   LEUKOCYTESUR NEGATIVE 08/30/2023 1539   Sepsis Labs: @LABRCNTIP (procalcitonin:4,lacticidven:4) Microbiology: No results found for this or any  previous visit (from the past 240 hours).    Radiology Studies last 3 days: No results found.        Kadejah Sandiford, DO [facility] 08/31/2023, 3:02 PM    Dictation software may have been used to generate the above note. Typos may occur and escape review in typed/dictated notes. Please contact Dr Marsa directly for clarity if needed.  Staff may message me via secure chat in Epic  but this may not receive an immediate response,  please page me for urgent matters!  If 7PM-7AM, please contact night coverage [email or web address]

## 2023-08-31 NOTE — Progress Notes (Addendum)
 Initial Nutrition Assessment  DOCUMENTATION CODES:   Non-severe (moderate) malnutrition in context of chronic illness  INTERVENTION:   -Ensure Plus High Protein po BID, each supplement provides 350 kcal and 20 grams of protein  -Continue regular diet -MVI with minerals daily  NUTRITION DIAGNOSIS:   Moderate Malnutrition related to chronic illness (pancreatic cancer) as evidenced by mild fat depletion, moderate fat depletion, mild muscle depletion, moderate muscle depletion, percent weight loss.  GOAL:   Patient will meet greater than or equal to 90% of their needs  MONITOR:   PO intake, Supplement acceptance  REASON FOR ASSESSMENT:   Malnutrition Screening Tool    ASSESSMENT:   Pt with past medical history of pancreatic Mass with liver mets, status post biliary stent insertion, A-fib and PE on Eliquis , HTN, HLD, pre-DM, COPD/asthma, CKD 3, gout, GERD, lumbar spinal stenosis, as reviewed from EMR, presented with complaining of decreased oral intake for several days and feeling weak and tired.  Pt admitted with hyponatremia.   Reviewed I/O's: +1.2 L x 24 hours  Spoke with pt at bedside, who was pleasant and in good spirits today. Pt reports feeling better today and is hopeful to discharge home tomorrow.   Pt shares that she has experienced a general decline in health over the past two months due to decreased appetite and weight loss. Per pt, she had intentional weight loss over the past two years in order to qualify for knee replacement surgery (was initially 297# and lost 100# to qualify for surgery). Per pt, she reports losing weight steadily post-operatively due to increased activity and participation in physical therapy (last surgery on 10/2022). Per pt, she has lost from 194# to 150# within the past 2 months, which pt reports is due to decreased appetite. Pt shares she was consuming 1 meal per day, which is a few bites of potatoes. Per pt, she experiences early satiety and  food often comes back up if she eats more.   Since being admitted, appetite has increased- she consumed 100% of sweet potato and chicken salad. Pt has also been snacking on ice pops.   Reviewed wt hx; pt has experienced a 18.1% wt loss over the past month, which is significant for time frame.   Discussed importance of good meal and supplement intake to promote healing. Pt amenable to Ensure.   Pt admits to recent diagnosis of stage IV pancreatic cancer on 08/26/23. Pt shares that she has good family support and is accepting of her diagnosis. Emotional support provided.   Medications reviewed and include dulcolax, protonix , miralax , megace , and sodium chloride .   Lab Results  Component Value Date   HGBA1C 5.7 (A) 08/01/2023   PTA DM medications are none.   Labs reviewed: Na: 129, Phos: 2.0, CBGS: 79 (inpatient orders for glycemic control are none).    NUTRITION - FOCUSED PHYSICAL EXAM:  Flowsheet Row Most Recent Value  Orbital Region Moderate depletion  Upper Arm Region Moderate depletion  Thoracic and Lumbar Region Mild depletion  Buccal Region Mild depletion  Temple Region Moderate depletion  Clavicle Bone Region Mild depletion  Clavicle and Acromion Bone Region Mild depletion  Scapular Bone Region Mild depletion  Dorsal Hand Moderate depletion  Patellar Region Moderate depletion  Anterior Thigh Region Moderate depletion  Posterior Calf Region Moderate depletion  Edema (RD Assessment) Mild  Hair Reviewed  Eyes Reviewed  Mouth Reviewed  Skin Reviewed  Nails Reviewed    Diet Order:   Diet Order  Diet regular Room service appropriate? Yes; Fluid consistency: Thin  Diet effective now                   EDUCATION NEEDS:   Education needs have been addressed  Skin:  Skin Assessment: Reviewed RN Assessment  Last BM:  08/31/23 (type 6)  Height:   Ht Readings from Last 1 Encounters:  08/30/23 5' 4 (1.626 m)    Weight:   Wt Readings from Last 1  Encounters:  08/30/23 68 kg    Ideal Body Weight:  54.5 kg  BMI:  Body mass index is 25.75 kg/m.  Estimated Nutritional Needs:   Kcal:  1700-1900  Protein:  90-105 grams  Fluid:  1.7-1.9 L    Margery ORN, RD, LDN, CDCES Registered Dietitian III Certified Diabetes Care and Education Specialist If unable to reach this RD, please use RD Inpatient group chat on secure chat between hours of 8am-4 pm daily

## 2023-08-31 NOTE — Hospital Course (Signed)
 Hospital course / significant events:   HPI: Colleen [name] is a 78 y.o. female with Past medical history of pancreatic Mass with liver mets, status post biliary stent insertion, A-fib and PE on Eliquis , HTN, HLD, pre-DM, COPD/asthma, CKD 3, gout, GERD, lumbar spinal stenosis, as reviewed from EMR, presented at [facility] ED with complaining of decreased oral intake for several days and feeling weak and tired.  Patient is feeling dizzy and unable to ambulate. Sent to ED from oncology office w/ tachycardia and presyncope / AMS with reduced responsiveness  08/05: to ED, hypotensive BP 67/32, improved after IV fluids. Hyponatremia sodium 124. WBC 17.5. Admitted to hospitalist 08/06: sodium improving, d/c IV NS this afternoon and continue to monitor       Consultants:  none  Procedures/Surgeries: none      ASSESSMENT & PLAN:   Hyponatremia Poor po intake, question dehydration / salt deficient diet. Also on hydrochlorothiazide   Sodium 124 >> 127 after NS IV fluid given in the ED Serum osmolality 278 WNL Urine osmolality 350, urine sodium 80 Continue IV fluid for hydration, to dc this afternoon  Na Correction target 8 to 10 mmol/L/24 hrs  Started salt tablets 2 g p.o. twice daily Monitor sodium level closely    Hypotension Presented with presyncope Essential HTN on antihypertensive  Mildly elevated troponin most likely due to demand ischemia BP improved after IV fluid given in the ED Continue IV fluid for hydration, to dc this afternoon  Held antihypertensive medications for now Resume as needed, DC hydrochlorothiazide  on discharge to prevent electrolyte imbalance in future   Hypokalemia, potassium repleted. Hypophosphatemia, Phos repleted. Monitor electrolytes and replete as needed.   Leukocytosis most likely reactive, procalcitonin negative Monitor off antibiotics for now   Pancreatic mass with biliary stenosis status post percutaneous biliary stent insertion.  Catheter is  intact. Patient is following oncology as an outpatient for No active issues.    A-fib and PE on Eliquis ,  Resumed Eliquis  home dose   pre-DM, BG stable, not on any medication Follow outpatient    COPD/asthma, no exacerbation on exam Resumed home inhalers   CKD 3 Monitor BMP   Gout;  allopurinol    GERD PPI   Lumbar spinal stenosis:  prn pain meds   Constipation:  Prn laxatives      overweight based on BMI: Body mass index is 25.75 kg/m.SABRA Significantly low or high BMI is associated with higher medical risk.  Underweight - under 18  overweight - 25 to 29 obese - 30 or more Class 1 obesity: BMI of 30.0 to 34 Class 2 obesity: BMI of 35.0 to 39 Class 3 obesity: BMI of 40.0 to 49 Super Morbid Obesity: BMI 50-59 Super-super Morbid Obesity: BMI 60+ Healthy nutrition and physical activity advised as adjunct to other disease management and risk reduction treatments    DVT prophylaxis: eliquis  IV fluids: dc continuous IV fluids  Nutrition: regular diet Central lines / other devices: none  Code Status: FULL CODE ACP documentation reviewed: none on file in [facility]YNCA  TOC needs: home health Medical barriers to dispo: hyponatremia, hypotension . Expected medical readiness for discharge tomorrow.

## 2023-08-31 NOTE — Plan of Care (Signed)

## 2023-08-31 NOTE — TOC Initial Note (Signed)
 Transition of Care [facility]) - Initial/Assessment Note    Patient Details  Name: Colleen [name] MRN: [id number] Date of Birth: [birth year]  Transition of Care [facility]) CM/SW Contact:    Dalia GORMAN Fuse, RN Phone Number: 08/31/2023, 2:22 PM  Clinical Narrative:                 Patient is from home alone. She has a niece who is very supportive and able to help get her to and from appointments. Her PCP is Dr. Michelene Deforest with [facility] and she uses [facility] on [address]. She has DME walker, cane, BSC, and shower bench. She has had HH PT/OT with [facility] in the past and would like to use them again. TOC sent referral to [facility] [place], [place] accepted the referral.      Barriers to Discharge: Barriers Resolved   Patient Goals and CMS Choice            Expected Discharge Plan and Services                                   HH Arranged: PT, OT HH Agency: [facility] Date [facility] Agency Contacted: 08/31/23 Time HH Agency Contacted: 1422 Representative spoke with at [facility] Agency: [facility]  Prior Living Arrangements/Services                       Activities of Daily Living   ADL Screening (condition at time of admission) Independently performs ADLs?: Yes (appropriate for developmental age) Is the patient deaf or have difficulty hearing?: No Does the patient have difficulty seeing, even when wearing glasses/contacts?: No Does the patient have difficulty concentrating, remembering, or making decisions?: No  Permission Sought/Granted                  Emotional Assessment              Admission diagnosis:  Hyponatremia [E87.1] Acute kidney injury (HCC) [N17.9] Patient Active Problem List   Diagnosis Date Noted   Hyponatremia 08/30/2023   Abnormal LFTs 08/04/2023   Chronic kidney disease, stage 3a (HCC) 08/04/2023   Obesity (BMI 30-39.9) 08/04/2023   Atrial fibrillation, chronic (HCC) 08/04/2023   Pulmonary embolism (HCC)  08/04/2023   Hypokalemia 08/04/2023   Pancreatic mass 08/04/2023   New onset type 2 diabetes mellitus (HCC) 04/04/2023   Acquired trigger finger of right middle finger 07/24/2021   Acquired trigger finger of right ring finger 07/24/2021   Trigger finger of right hand 06/26/2021   Hidradenitis suppurativa 11/12/2020   Status post gastroplasty-1980s 06/25/2020   CLE (columnar lined esophagus)    Hiatal hernia    Gastric nodule    Angiodysplasia of stomach and duodenum    Cecal polyp    Polyp of descending colon    Polyp of transverse colon    Atrial fibrillation (HCC) 06/14/2019   Current use of anticoagulant therapy 06/14/2019   Iron deficiency anemia 06/14/2019   Essential (hemorrhagic) thrombocythemia (HCC) 06/14/2019   GERD (gastroesophageal reflux disease) 04/15/2019   Chronic low back pain 04/15/2019   Right pulmonary embolus (HCC) 04/15/2019   DOE (dyspnea on exertion) 03/13/2019   Atherosclerosis of both carotid arteries 09/20/2018   Spinal stenosis of lumbar region with neurogenic claudication 09/20/2018   Aortic atherosclerosis (HCC) 09/05/2018   At high risk for falls 09/05/2018   Osteopenia 05/18/2017   Stage 3  chronic kidney disease (HCC) 04/21/2017   Morbid obesity (HCC) 04/21/2017   Trigger middle finger of left hand 02/28/2017   Chronic gouty arthropathy without tophi 11/29/2016   Hypergammaglobulinemia 11/29/2016   Osteoarthritis of both knees 11/29/2016   Bilateral primary osteoarthritis of knee 11/29/2016   Allergic rhinitis, seasonal 07/30/2014   Gout 07/30/2014   HLD (hyperlipidemia) 07/30/2014   Osteoarthritis of knee 07/30/2014   Prediabetes 07/30/2014   Asthma, mild persistent 07/30/2014   COPD (chronic obstructive pulmonary disease) (HCC) 07/09/2008   Benign essential HTN 06/22/2006   PCP:  Leavy Mole, PA-C Pharmacy:   [facility] [address]), [place] - [address] [place]) [place]  [address] Phone: [phone] Fax: [phone]     Social Drivers of Health (SDOH) Social History: SDOH Screenings   Food Insecurity: Food Insecurity Present (08/30/2023)  Housing: Low Risk  (08/30/2023)  Transportation Needs: Unmet Transportation Needs (08/30/2023)  Utilities: At Risk (08/30/2023)  Alcohol Screen: Low Risk  (12/02/2022)  Depression (PHQ2-9): Low Risk  (08/12/2023)  Financial Resource Strain: Low Risk  (12/02/2022)  Physical Activity: Insufficiently Active (12/02/2022)  Social Connections: Socially Isolated (08/30/2023)  Stress: No Stress Concern Present (12/02/2022)  Tobacco Use: Medium Risk (08/30/2023)  Health Literacy: Adequate Health Literacy (12/02/2022)   SDOH Interventions:     Readmission Risk Interventions     No data to display

## 2023-08-31 NOTE — Evaluation (Signed)
 Occupational Therapy Evaluation Patient Details Name: Colleen [name] MRN: [id number] DOB: 25-Aug-1945 Today's Date: 08/31/2023   History of Present Illness   78 y.o. female with Past medical history of pancreatic Mass with liver mets, status post biliary stent insertion, A-fib and PE on Eliquis , HTN, HLD, pre-DM, COPD/asthma, CKD 3, gout, GERD, lumbar spinal stenosis, as reviewed from EMR, presented at [facility] ED with complaining of decreased oral intake for several days and feeling weak and tired.  Patient is feeling dizzy and unable to ambulate.  She woke up today and could not get out of the bed.  She went to physician office where she also passed out, had a presyncopal episode.     Clinical Impressions Patient presenting with decreased Ind in self care,balance,functional mobility/transfers, endurance, and safety awareness. Patient reports being Ind at baseline with use of SPC. Pt lives at home. She does not drive but has very supportive niece that assists with IADLs and taking her to appointments/getting groceries. Patient currently functioning at min guard- min A with use of RW for mobility in room and LB dressing with sit <>stand from EOB. Pt returning to supine at end of session to rest after bath. Call bell and all needed items within reach.  Patient will benefit from acute OT to increase overall independence in the areas of ADLs, functional mobility, and safety awareness in order to safely discharge.     If plan is discharge home, recommend the following:   A little help with walking and/or transfers;A little help with bathing/dressing/bathroom;Assistance with cooking/housework;Assist for transportation;Help with stairs or ramp for entrance     Functional Status Assessment   Patient has had a recent decline in their functional status and demonstrates the ability to make significant improvements in function in a reasonable and predictable amount of time.     Equipment  Recommendations   None recommended by OT      Precautions/Restrictions   Precautions Precautions: Fall     Mobility Bed Mobility Overal bed mobility: Needs Assistance Bed Mobility: Supine to Sit, Sit to Supine     Supine to sit: Supervision Sit to supine: Contact guard assist        Transfers Overall transfer level: Needs assistance Equipment used: Rolling walker (2 wheels) Transfers: Sit to/from Stand Sit to Stand: Contact guard assist                  Balance Overall balance assessment: Needs assistance Sitting-balance support: Feet supported Sitting balance-Leahy Scale: Good     Standing balance support: During functional activity, Bilateral upper extremity supported, Single extremity supported Standing balance-Leahy Scale: Fair                             ADL either performed or assessed with clinical judgement   ADL Overall ADL's : Needs assistance/impaired     Grooming: Wash/dry hands;Wash/dry face;Sitting;Supervision/safety;Set up               Lower Body Dressing: Contact guard assist;Sit to/from stand   Toilet Transfer: Contact guard assist Toilet Transfer Details (indicate cue type and reason): simulated                 Vision Baseline Vision/History: 1 Wears glasses Patient Visual Report: No change from baseline              Pertinent Vitals/Pain Pain Assessment Pain Assessment: No/denies pain     Extremity/Trunk Assessment Upper Extremity Assessment  Upper Extremity Assessment: Generalized weakness   Lower Extremity Assessment Lower Extremity Assessment: Generalized weakness       Communication Communication Communication: No apparent difficulties   Cognition Arousal: Alert Behavior During Therapy: WFL for tasks assessed/performed Cognition: No apparent impairments                               Following commands: Intact       Cueing  General Comments   Cueing Techniques:  Verbal cues              Home Living Family/patient expects to be discharged to:: Private residence Living Arrangements: Alone Available Help at Discharge: Family;Available PRN/intermittently Type of Home: House Home Access: Stairs to enter [employer] of Steps: 4 Entrance Stairs-Rails: Right Home Layout: One level     Bathroom Shower/Tub: Tub/shower unit;Sponge bathes at baseline         Home Equipment: Agricultural consultant (2 wheels);Cane - single point;Tub bench          Prior Functioning/Environment Prior Level of Function : Independent/Modified Independent               ADLs Comments: Pt is Ind in self care and IADLs. She no longer drives but niece helps get appts and get groceries    OT Problem List: Decreased strength;Decreased safety awareness;Decreased activity tolerance;Impaired balance (sitting and/or standing)   OT Treatment/Interventions: Self-care/ADL training;Therapeutic activities;Therapeutic exercise;Energy conservation;Patient/family education;Balance training      OT Goals(Current goals can be found in the care plan section)   Acute Rehab OT Goals Patient Stated Goal: to go home OT Goal Formulation: With patient Time For Goal Achievement: 09/14/23 Potential to Achieve Goals: Fair ADL Goals Pt Will Perform Grooming: with modified independence Pt Will Perform Lower Body Dressing: with modified independence;sit to/from stand Pt Will Transfer to Toilet: with modified independence;ambulating Pt Will Perform Toileting - Clothing Manipulation and hygiene: with modified independence;sit to/from stand   OT Frequency:  Min 2X/week       AM-PAC OT 6 Clicks Daily Activity     Outcome Measure Help from another person eating meals?: None Help from another person taking care of personal grooming?: None Help from another person toileting, which includes using toliet, bedpan, or urinal?: A Little Help from another person bathing (including  washing, rinsing, drying)?: A Little Help from another person to put on and taking off regular upper body clothing?: None Help from another person to put on and taking off regular lower body clothing?: A Little 6 Click Score: 21   End of Session Equipment Utilized During Treatment: Rolling walker (2 wheels) Nurse Communication: Mobility status  Activity Tolerance: Patient tolerated treatment well Patient left: in bed  OT Visit Diagnosis: Unsteadiness on feet (R26.81);Muscle weakness (generalized) (M62.81)                Time: 9054-8994 OT Time Calculation (min): 20 min Charges:  OT General Charges $OT Visit: 1 Visit OT Evaluation $OT Eval Low Complexity: 1 Low OT Treatments $Self Care/Home Management : 8-22 mins  Izetta Claude, MS, OTR/L , CBIS ascom [phone]  08/31/23, 1:04 PM

## 2023-09-01 DIAGNOSIS — E871 Hypo-osmolality and hyponatremia: Secondary | ICD-10-CM | POA: Diagnosis not present

## 2023-09-01 DIAGNOSIS — E440 Moderate protein-calorie malnutrition: Secondary | ICD-10-CM | POA: Insufficient documentation

## 2023-09-01 LAB — HEPATIC FUNCTION PANEL
ALT: 18 U/L (ref 0–44)
AST: 25 U/L (ref 15–41)
Albumin: 2.6 g/dL — ABNORMAL LOW (ref 3.5–5.0)
Alkaline Phosphatase: 139 U/L — ABNORMAL HIGH (ref 38–126)
Bilirubin, Direct: 0.2 mg/dL (ref 0.0–0.2)
Indirect Bilirubin: 0.8 mg/dL (ref 0.3–0.9)
Total Bilirubin: 1 mg/dL (ref 0.0–1.2)
Total Protein: 5.7 g/dL — ABNORMAL LOW (ref 6.5–8.1)

## 2023-09-01 LAB — CBC
HCT: 34.4 % — ABNORMAL LOW (ref 36.0–46.0)
Hemoglobin: 11.9 g/dL — ABNORMAL LOW (ref 12.0–15.0)
MCH: 30.8 pg (ref 26.0–34.0)
MCHC: 34.6 g/dL (ref 30.0–36.0)
MCV: 89.1 fL (ref 80.0–100.0)
Platelets: 245 K/uL (ref 150–400)
RBC: 3.86 MIL/uL — ABNORMAL LOW (ref 3.87–5.11)
RDW: 13.2 % (ref 11.5–15.5)
WBC: 9.6 K/uL (ref 4.0–10.5)
nRBC: 0 % (ref 0.0–0.2)

## 2023-09-01 LAB — BASIC METABOLIC PANEL WITH GFR
Anion gap: 7 (ref 5–15)
BUN: 19 mg/dL (ref 8–23)
CO2: 24 mmol/L (ref 22–32)
Calcium: 8.8 mg/dL — ABNORMAL LOW (ref 8.9–10.3)
Chloride: 103 mmol/L (ref 98–111)
Creatinine, Ser: 0.72 mg/dL (ref 0.44–1.00)
GFR, Estimated: >60 mL/min (ref >60)
Glucose, Bld: 112 mg/dL — ABNORMAL HIGH (ref 70–99)
Potassium: 3.4 mmol/L — ABNORMAL LOW (ref 3.5–5.1)
Sodium: 134 mmol/L — ABNORMAL LOW (ref 135–145)

## 2023-09-01 LAB — PHOSPHORUS: Phosphorus: 1.6 mg/dL — ABNORMAL LOW (ref 2.5–4.6)

## 2023-09-01 LAB — MAGNESIUM: Magnesium: 1.7 mg/dL (ref 1.7–2.4)

## 2023-09-01 MED ORDER — SODIUM CHLORIDE 1 G PO TABS: 2.0000 g | ORAL_TABLET | Freq: Two times a day (BID) | ORAL | 0 refills | Status: DC

## 2023-09-01 MED ORDER — MEGESTROL ACETATE 20 MG PO TABS: 20.0000 mg | ORAL_TABLET | Freq: Two times a day (BID) | ORAL | 0 refills | Status: DC

## 2023-09-01 NOTE — Progress Notes (Signed)
 [facility]  [phone] [phone]) [phone]  ID: Colleen [name] OB: 15-Apr-1945  MR#: [id number]  CSN#:[id number]  Patient Care Team: Leavy Mole, PA-C as PCP - General (Family Medicine) Darliss Rogue, MD as PCP - Cardiology (Cardiology) Dave Agent, MD as Consulting Physician (Rheumatology) Emilio Garrie BRAVO, RN (Inactive) as Registered Nurse Ceola Tereasa PARAS, MD as Referring Physician (Oncology) Therisa Bi, MD as Consulting Physician (Gastroenterology) Maurie Rayfield BIRCH, RN as Oncology Nurse Navigator Jacobo, Colleen PARAS, MD as Consulting Physician (Oncology)  CHIEF COMPLAINT: Pancreatic mass.  INTERVAL HISTORY: Patient returns to clinic today for further evaluation and discussion of her imaging results.  Patient continues to have chronic weakness and fatigue.  She reported having increased dizziness and was found to have a significantly decreased blood pressure.  She was subsequent transferred to the emergency room for further evaluation.   REVIEW OF SYSTEMS:   Review of Systems  Constitutional:  Positive for malaise/fatigue. Negative for fever and weight loss.  Respiratory: Negative.  Negative for cough, hemoptysis and shortness of breath.   Cardiovascular: Negative.  Negative for chest pain and leg swelling.  Gastrointestinal: Negative.  Negative for abdominal pain.  Genitourinary: Negative.  Negative for dysuria.  Musculoskeletal: Negative.  Negative for back pain.  Skin: Negative.  Negative for rash.  Neurological:  Positive for dizziness and weakness. Negative for focal weakness and headaches.  Psychiatric/Behavioral: Negative.  The patient is not nervous/anxious.     As per HPI. Otherwise, a complete review of systems is negative.  PAST MEDICAL HISTORY: Past Medical History:  Diagnosis Date   Asthma    Cataract    COPD (chronic obstructive pulmonary disease) (HCC)    GERD (gastroesophageal reflux disease)    Gout    History of  pulmonary embolus (PE)    Hyperlipidemia    Hypertension    Osteoarthrosis    Prediabetes     PAST SURGICAL HISTORY: Past Surgical History:  Procedure Laterality Date   BACK SURGERY     BREAST BIOPSY Left 04/08/2014   intraductal papilloma 5 mm   COLONOSCOPY WITH PROPOFOL  N/A 04/23/2020   Procedure: COLONOSCOPY WITH PROPOFOL ;  Surgeon: Janalyn Keene NOVAK, MD;  Location: [facility] ENDOSCOPY;  Service: Endoscopy;  Laterality: N/A;   ERCP N/A 08/05/2023   Procedure: ERCP, WITH INTERVENTION IF INDICATED;  Surgeon: Jinny Carmine, MD;  Location: [facility] ENDOSCOPY;  Service: Endoscopy;  Laterality: N/A;   ESOPHAGOGASTRODUODENOSCOPY (EGD) WITH PROPOFOL  N/A 04/23/2020   Procedure: ESOPHAGOGASTRODUODENOSCOPY (EGD) WITH PROPOFOL ;  Surgeon: Janalyn Keene NOVAK, MD;  Location: [facility] ENDOSCOPY;  Service: Endoscopy;  Laterality: N/A;   GIVENS CAPSULE STUDY N/A 01/28/2022   Procedure: GIVENS CAPSULE STUDY;  Surgeon: Therisa Bi, MD;  Location: [facility] ENDOSCOPY;  Service: Gastroenterology;  Laterality: N/A;   IR INT EXT BILIARY DRAIN WITH CHOLANGIOGRAM  08/08/2023   LUMBAR LAMINECTOMY     TONSILLECTOMY      FAMILY HISTORY: Family History  Problem Relation Age of Onset   Diabetes Mother    Lung cancer Father    Diabetes Sister    Congestive Heart Failure Sister    Glaucoma Sister    Breast cancer Maternal Aunt     ADVANCED DIRECTIVES (Y/N):  N  HEALTH MAINTENANCE: Social History   Tobacco Use   Smoking status: Former    Current packs/day: 0.00    Average packs/day: 1.5 packs/day for 20.0 years (30.0 ttl pk-yrs)    Types: Cigarettes    Start date: 11/13/1988    Quit date: 11/13/2008  Years since quitting: 14.8   Smokeless tobacco: Never   Tobacco comments:    smoking cessation materials not required  Vaping Use   Vaping status: Never Used  Substance Use Topics   Alcohol use: Not Currently   Drug use: No     Colonoscopy:  PAP:  Bone density:  Lipid panel:  Allergies  Allergen  Reactions   Nsaids     Other Reaction(s): Not available    Current Outpatient Medications  Medication Sig Dispense Refill   acetaminophen  (TYLENOL ) 650 MG CR tablet Take 650 mg by mouth every 8 (eight) hours as needed for pain.     albuterol  (VENTOLIN  HFA) 108 (90 Base) MCG/ACT inhaler Inhale 2 puffs into the lungs every 4 (four) hours as needed for wheezing or shortness of breath. 8 g 2   allopurinol  (ZYLOPRIM ) 100 MG tablet Take 2 tablets (200 mg total) by mouth daily. 180 tablet 1   apixaban  (ELIQUIS ) 2.5 MG TABS tablet Take 2.5 mg by mouth 2 (two) times daily.     Blood Pressure Monitoring (ADULT BLOOD PRESSURE CUFF LG) KIT 1 each by Does not apply route daily. 1 kit 0   BREZTRI  AEROSPHERE 160-9-4.8 MCG/ACT AERO inhaler INHALE 2 PUFFS TWICE DAILY 33 g 0   cholecalciferol (VITAMIN D3) 25 MCG (1000 UNIT) tablet Take 1,000 Units by mouth daily.     [Paused] enalapril  (VASOTEC ) 2.5 MG tablet Take 1 tablet (2.5 mg total) by mouth daily. 90 tablet 3   megestrol  (MEGACE ) 20 MG tablet Take 1 tablet (20 mg total) by mouth 2 (two) times daily. 60 tablet 0   Multiple Vitamin (MULTI-VITAMIN) tablet Take 1 tablet by mouth.  Take 1 tablet by mouth in the morning. 50 plus.     Multiple Vitamins-Minerals (MULTIVITAMIN WOMEN 50+ PO) Take 1 tablet by mouth daily.     ondansetron  (ZOFRAN -ODT) 4 MG disintegrating tablet Take 1 tablet (4 mg total) by mouth every 8 (eight) hours as needed for nausea or vomiting. 20 tablet 0   pantoprazole  (PROTONIX ) 40 MG tablet Take 1 tablet (40 mg total) by mouth daily. 30 tablet 3   simethicone  (GAS-X) 80 MG chewable tablet Chew 1 tablet (80 mg total) by mouth 2 (two) times a day. 60 tablet 2   sodium chloride  1 g tablet Take 2 tablets (2 g total) by mouth 2 (two) times daily with a meal. 120 tablet 0   Sodium Chloride  Flush (NORMAL SALINE FLUSH) 0.9 % SOLN Inject 10 mLs into the vein daily. 300 mL 1   triamcinolone  cream (KENALOG ) 0.1 % APPLY TO AFFECTED AREA TWICE DAILY IF  NEEDED. NO MORE THAN 1 WEEK AT A TIME, THEN TAKE A BREAK FOR 2 WEEKS. 80 g 0   No current facility-administered medications for this visit.    OBJECTIVE: Vitals:   08/30/23 1156  Pulse: (!) 153  Resp: 16  Temp: (!) 93.8 F (34.3 C)  SpO2: 91%     Body mass index is 23.69 kg/m.    ECOG FS:3 - Symptomatic, >50% confined to bed  General: Ill-appearing, sitting in a wheelchair. Eyes: Pink conjunctiva, anicteric sclera. HEENT: Normocephalic, moist mucous membranes. Lungs: No audible wheezing or coughing. Heart: Regular rate and rhythm. Abdomen: Soft, nontender, no obvious distention. Musculoskeletal: No edema, cyanosis, or clubbing. Neuro: Alert, answering all questions appropriately. Cranial nerves grossly intact. Skin: No rashes or petechiae noted. Psych: Normal affect.   LAB RESULTS:  Lab Results  Component Value Date   NA 134 (L) 09/01/2023  K 3.4 (L) 09/01/2023   CL 103 09/01/2023   CO2 24 09/01/2023   GLUCOSE 112 (H) 09/01/2023   BUN 19 09/01/2023   CREATININE 0.72 09/01/2023   CALCIUM  8.8 (L) 09/01/2023   PROT 5.7 (L) 09/01/2023   ALBUMIN 2.6 (L) 09/01/2023   AST 25 09/01/2023   ALT 18 09/01/2023   ALKPHOS 139 (H) 09/01/2023   BILITOT 1.0 09/01/2023   GFRNONAA >60 09/01/2023   GFRAA 58 (L) 02/28/2020    Lab Results  Component Value Date   WBC 9.6 09/01/2023   NEUTROABS 13.0 (H) 08/30/2023   HGB 11.9 (L) 09/01/2023   HCT 34.4 (L) 09/01/2023   MCV 89.1 09/01/2023   PLT 245 09/01/2023     STUDIES: NM PET Image Initial (PI) Skull Base To Thigh Result Date: 08/23/2023 CLINICAL DATA:  Initial treatment strategy for pancreatic mass. EXAM: NUCLEAR MEDICINE PET SKULL BASE TO THIGH TECHNIQUE: 9.14 mCi F-18 FDG was injected intravenously. Full-ring PET imaging was performed from the skull base to thigh after the radiotracer. CT data was obtained and used for attenuation correction and anatomic localization. Fasting blood glucose: 83 mg/dl COMPARISON:  MRI  92/89/7974.  Ultrasound 08/04/2023. FINDINGS: Mediastinal blood pool activity: SUV max 3.2 Liver activity: SUV max 3.6 NECK: There is a small area of uptake identified along the thoracic along the right side maximum SUV 7.1. There is a very small lymph node in this location on image 29 measuring 3 mm in short axis. This could be an atypical pathologic node. No additional areas of abnormal uptake elsewhere along lymph node chains in the neck. There is some misregistration between PET and CT scan with some motion. Grossly the visualized intracranial compartment has near symmetric uptake. Incidental CT findings: Area paranasal sinuses and mastoid air cells are clear. The parotid glands, submandibular glands and thyroid  gland is preserved. Mild vascular calcifications are seen. CHEST: No specific abnormal uptake above blood pool in the axillary regions, hilum or mediastinum. No areas of abnormal lung uptake. Incidental CT findings: Heart is nonenlarged. No pericardial effusion. Normal caliber thoracic aorta with some scattered vascular calcifications. There is also some coronary artery calcifications as well as some mild calcifications along the great vessels. Slightly patulous thoracic esophagus. Mild linear opacity along the left lung base likely scar or atelectasis. No pleural effusion. ABDOMEN/PELVIS: There are 3 lesions seen in the liver. Two dome lesions on the right side are included. The anterior lesion has maximum SUV of 6.9 on image 68 in the posterior lesion maximum SUV of 6.3 on image 66. These are quite small, under a cm. There is a segment 4B lesion as well with maximum SUV of 6.7 on image 81. Subtle liver metastases are possible. Attention on follow-up. There is significant abnormal uptake corresponding to the pancreatic head mass. This has maximum SUV of 26.0 there on prior MRI measured 2.8 x 1.8 cm. Again pancreatic parenchymal atrophy and ductal dilatation identified. No additional areas of abnormal  uptake. Physiologic distribution radiotracer seen along the other parenchymal organs, bowel and renal collecting systems. No definite areas of abnormal nodal uptake. Incidental CT findings: Left-sided PTC catheter in place. Pigtail extends to the 2/3 portion of the duodenum. Air in the biliary tree. Distended gallbladder. Surgical changes in the left upper quadrant involving the proximal stomach and adjacent tissues. The stomach, small and large bowel are nondilated. Moderate colonic stool with significant stool in the rectum. Scattered vascular calcifications. No abnormal calcifications seen within either kidney nor along the course  of either ureter. Lobular uterus with multiple calcified fibroids. Spleen, adrenal glands are preserved. SKELETON: No focal hypermetabolic activity to suggest skeletal metastasis. Incidental CT findings: Scattered degenerative changes identified. Multilevel stenosis. Sclerosis identified along the right femoral head. IMPRESSION: Known hypermetabolic pancreatic head mass as seen previously. There are 3 small areas of asymmetric increased uptake in the liver which could be small hepatic metastases. Attention on follow-up. Solitary area of uptake the thoracic inlet on the right side correspond a very small lymph node. Atypical lymph node metastasis is possible. Attention on short follow-up. PTC catheter in place with air in the biliary tree. Uterine fibroids. Colonic sigmoid diverticula. Significant colonic stool in the rectum. Electronically Signed   By: Ranell Bring M.D.   On: 08/23/2023 16:10   IR INT EXT BILIARY DRAIN WITH CHOLANGIOGRAM Result Date: 08/08/2023 INDICATION: Pancreatic mass with CBD compression/obstruction. EXAM: Procedures; 1. ULTRASOUND AND FLUOROSCOPIC GUIDED PERCUTANEOUS TRANSHEPATIC CHOLANGIOGRAM AND BILIARY TUBE PLACEMENT 2. FLUOROSCOPIC-GUIDED COMMON BILE DUCT BRUSH BIOPSIES COMPARISON:  MRI ABDOMEN, 08/04/2023.  ABDOMINAL CT, 08/04/2018. MEDICATIONS: Ancef  2 gm  IV; The antibiotic was administered with an appropriate time frame prior to the initiation of the procedure 4 g Zofran  IV CONTRAST:  50mL OMNIPAQUE  IOHEXOL  300 MG/ML SOLN - administered into the biliary tree. ANESTHESIA/SEDATION: Moderate (conscious) sedation was employed during this procedure. A total of Versed  2 mg and Fentanyl  200 mcg was administered intravenously. Moderate Sedation Time: 65 minutes. The patient's level of consciousness and vital signs were monitored continuously by radiology nursing throughout the procedure under my direct supervision. FLUOROSCOPY TIME:  Radiation Exposure Index and estimated peak skin dose (PSD); Reference air kerma (RAK), 17.2 mGy. COMPLICATIONS: None immediate. TECHNIQUE: Informed written consent was obtained from the patient and/or patient's representative after a discussion of the risks, benefits and alternatives to treatment. Questions regarding the procedure were encouraged and answered. A timeout was performed prior to the initiation of the procedure. The right upper abdominal quadrant was prepped and draped in the usual sterile fashion, and a sterile drape was applied covering the operative field. Maximum barrier sterile technique with sterile gowns and gloves were used for the procedure. A timeout was performed prior to the initiation of the procedure. Ultrasound scanning of the right upper abdominal quadrant was performed to delineate the anatomy and avoid transgression of the gallbladder or the pleura. After the overlying soft tissues were anesthetized with 1% Lidocaine  with epinephrine, under direct ultrasound guidance, a 22 gauge Chiba needle was utilized to cannulate the peripheral aspect of a LEFT intrahepatic biliary duct. Appropriate position was confirmed with limited contrast injection. Next, the duct was cannulated with a Nitrex wire and dilated with an Accustick set under fluoroscopic guidance. Limited cholangiograms were performed in various obliquities  confirming appropriate access. Next, a 4 Fr angled glide catheter was advanced through the outer sheath of the Accustick set and with the use of a stiff Glidewire, advanced through the biliary hilum, common bile duct and ampulla to the level of the duodenum. Contrast injection confirmed appropriate positioning. Attention was then directed to the breast biopsy. Using a coaxial brush biopsy kit, multiple passes of the distal common bile duct or performed. The burst within sheath and removed. Under intermittent fluoroscopic guidance and over an Amplatz wire, the track was dilated ultimately allowing placement of a 10 Fr biliary drainage catheter with coil ultimately locked within the duodenum. Contrast was injected and a completion radiographs were obtained in various obliquities. The catheter was connected to a drainage bag which  yielded the brisk return of clear bile. The catheter was secured to the skin with an interrupted suture and StatLock device. Dressings were applied. The patient tolerated the procedure well without immediate postprocedural complication. FINDINGS: *Sonographic evaluation of the liver demonstrates intrahepatic biliary ductal dilatation as was demonstrated on preceding abdominal CT. *Under direct ultrasound guidance, a dilated peripheral duct within the LEFT lobe of the liver was accessed allowing placement of a 10 Fr biliary drainage catheter with end ultimately coiled and locked within the duodenum and radiopaque side marker located proximal to the level of the biliary hilum. *Limited contrast injection demonstrates marked dilatation of the CBD and intrahepatic biliary tree with communication between the right and left biliary trees at the level of the hilum. *Suspected malignant shouldering involving the distal aspect of the CBD, with brush biopsies performed as requested. Additionally, 30 mL of bile was submitted for cytological analysis. IMPRESSION: 1. Successful placement of a 10 Fr  percutaneous biliary drainage catheter via a LEFT trans hepatic approach, with end coiled and locked within the duodenum. 2. Distal common bile duct stricture, with brush biopsies performed and additionally, bile submitted for cytological analysis. RECOMMENDATIONS: Patient will return to Vascular Interventional Radiology (VIR) for routine drainage catheter evaluation and exchange in 6-8 weeks. Thom Hall, MD [facility] Electronically Signed   By: Thom Hall M.D.   On: 08/08/2023 17:07   DG C-Arm 1-60 Min-No Report Result Date: 08/05/2023 Fluoroscopy was utilized by the requesting physician.  No radiographic interpretation.   MR ABDOMEN MRCP W WO CONTAST Result Date: 08/04/2023 CLINICAL DATA:  Elevated liver function studies. Abnormal ultrasound examination. Nonspecific (abnormal) findings on radiological and other examination of musculoskeletal system EXAM: MRI ABDOMEN WITHOUT AND WITH CONTRAST (INCLUDING MRCP) TECHNIQUE: Multiplanar multisequence MR imaging of the abdomen was performed both before and after the administration of intravenous contrast. Heavily T2-weighted images of the biliary and pancreatic ducts were obtained, and three-dimensional MRCP images were rendered by post processing. CONTRAST:  8mL GADAVIST  GADOBUTROL  1 MMOL/ML IV SOLN COMPARISON:  None Available. FINDINGS: Lower chest: The lung bases are grossly clear. No obvious pulmonary lesions, pleural or pericardial effusion. Hepatobiliary: Significant intra and extrahepatic biliary dilatation. The common bile duct measures up to 15 mm and abruptly becomes very narrowed. There is also marked dilatation of the main pancreatic duct which also becomes markedly narrowed at the same spot (classic double duct sign). There is also cystic duct dilatation and marked distention of the gallbladder. No hepatic lesions to suggest metastatic disease. Pancreas: Marked distension of the main  pancreatic duct along with severe atrophy of the pancreatic body and tail. Hypoenhancing infiltrating lesion involving the lower pancreatic head accounting for the common bile duct and main pancreatic ductal dilatation. This lesion measures approximately 2.8 x 1.8 cm. No obvious involvement of the major vascular structures. The splenic and portal veins are patent. Spleen:  Normal size.  No focal lesions. Adrenals/Urinary Tract: The adrenal glands and kidneys are unremarkable. 9 mm interpolar right renal cyst not requiring any further imaging evaluation follow-up. Stomach/Bowel: The stomach, duodenum, visualized small and visualized colon are grossly normal. Vascular/Lymphatic: The aorta and branch vessels are patent. The major venous structures are patent. No obvious abdominal adenopathy. Other: No ascites or abdominal wall hernia. No subcutaneous lesions. Musculoskeletal: No significant bony findings. IMPRESSION: 1. 2.8 x 1.8 cm hypoenhancing infiltrating lesion involving the lower pancreatic head accounting for the common bile duct and main pancreatic ductal dilatation. Findings consistent with pancreatic  adenocarcinoma. 2. No obvious involvement of the major vascular structures. 3. No abdominal adenopathy or hepatic lesions to suggest metastatic disease. Electronically Signed   By: MYRTIS Stammer M.D.   On: 08/04/2023 23:39   MR 3D Recon At Scanner Result Date: 08/04/2023 CLINICAL DATA:  Elevated liver function studies. Abnormal ultrasound examination. Nonspecific (abnormal) findings on radiological and other examination of musculoskeletal system EXAM: MRI ABDOMEN WITHOUT AND WITH CONTRAST (INCLUDING MRCP) TECHNIQUE: Multiplanar multisequence MR imaging of the abdomen was performed both before and after the administration of intravenous contrast. Heavily T2-weighted images of the biliary and pancreatic ducts were obtained, and three-dimensional MRCP images were rendered by post processing. CONTRAST:  8mL  GADAVIST  GADOBUTROL  1 MMOL/ML IV SOLN COMPARISON:  None Available. FINDINGS: Lower chest: The lung bases are grossly clear. No obvious pulmonary lesions, pleural or pericardial effusion. Hepatobiliary: Significant intra and extrahepatic biliary dilatation. The common bile duct measures up to 15 mm and abruptly becomes very narrowed. There is also marked dilatation of the main pancreatic duct which also becomes markedly narrowed at the same spot (classic double duct sign). There is also cystic duct dilatation and marked distention of the gallbladder. No hepatic lesions to suggest metastatic disease. Pancreas: Marked distension of the main pancreatic duct along with severe atrophy of the pancreatic body and tail. Hypoenhancing infiltrating lesion involving the lower pancreatic head accounting for the common bile duct and main pancreatic ductal dilatation. This lesion measures approximately 2.8 x 1.8 cm. No obvious involvement of the major vascular structures. The splenic and portal veins are patent. Spleen:  Normal size.  No focal lesions. Adrenals/Urinary Tract: The adrenal glands and kidneys are unremarkable. 9 mm interpolar right renal cyst not requiring any further imaging evaluation follow-up. Stomach/Bowel: The stomach, duodenum, visualized small and visualized colon are grossly normal. Vascular/Lymphatic: The aorta and branch vessels are patent. The major venous structures are patent. No obvious abdominal adenopathy. Other: No ascites or abdominal wall hernia. No subcutaneous lesions. Musculoskeletal: No significant bony findings. IMPRESSION: 1. 2.8 x 1.8 cm hypoenhancing infiltrating lesion involving the lower pancreatic head accounting for the common bile duct and main pancreatic ductal dilatation. Findings consistent with pancreatic adenocarcinoma. 2. No obvious involvement of the major vascular structures. 3. No abdominal adenopathy or hepatic lesions to suggest metastatic disease. Electronically Signed    By: MYRTIS Stammer M.D.   On: 08/04/2023 23:39   US  ABDOMEN LIMITED RUQ (LIVER/GB) Result Date: 08/04/2023 CLINICAL DATA:  Upper abdominal pain, decreased appetite, weight loss for months following episode of nausea and vomiting in April. Elevated liver function tests, including alk-phos. EXAM: ULTRASOUND ABDOMEN LIMITED RIGHT UPPER QUADRANT COMPARISON:  None available FINDINGS: Gallbladder: Gallstones: None Sludge: None Gallbladder Wall: Present Pericholecystic fluid: None Sonographic Murphy's Sign: Negative per technologist Gallbladder is dilated measuring up to 13.9 x 5.4 x 4.5 cm Common bile duct: Diameter: 14 mm Liver: Parenchymal echogenicity: Within normal limits Contours: Normal Lesions: None Portal vein: Patent.  Hepatopetal flow Other: None. IMPRESSION: Dilated common bile duct and gallbladder suspicious for stricture, mass, or choledocholith obstructing the flow of bile in the distal common bile duct. Further evaluation with contrast enhanced abdominal MRI/MRCP or ERCP should be performed. Electronically Signed   By: Aliene Lloyd M.D.   On: 08/04/2023 08:39    ASSESSMENT: Pancreatic mass.  PLAN:    Pancreatic mass: Highly suspicious for underlying malignancy.  Patient's CA 19-9 is greater than 7000.  Cytology from ERCP did not reveal malignant cells.  PET scan results from August 22, 2023 reviewed independently and report as above with hypermetabolism in the pancreatic head mass as well as 3 lesions in the liver consistent with metastatic disease.  Patient was previously given referral to [facility] for consideration of EUS for definitive diagnosis.  Follow-up after EUS. Hyperbilirubinemia: Resolved.  Patient has an external drain in place.  Follow-up with interventional radiology as scheduled. Hyponatremia: Patient's sodium level decreased to 124.  Emergency room as above.   Renal insufficiency: Patient's creatinine trended up to 1.20. Transaminitis: Chronic and unchanged.  External drain as  above. History of anemia: Per report patient has required iron infusions in the past, but her most recent hemoglobin is 14.2.  Patient expressed understanding and was in agreement with this plan. She also understands that She can call clinic at any time with any questions, concerns, or complaints.    Cancer Staging  No matching staging information was found for the patient.   Colleen JINNY Reusing, MD   09/01/2023 11:19 PM

## 2023-09-01 NOTE — Discharge Summary (Signed)
 Physician Discharge Summary   Patient: Colleen [name] MRN: [id number]  DOB: [birth year]   Admit:     Date of Admission: 08/30/2023 Admitted from: home   Discharge: Date of discharge: 09/01/23 Disposition: Home health Condition at discharge: good  CODE STATUS: FULL CODE     Discharge Physician: Laneta Blunt, DO [facility]     PCP: Leavy Mole, PA-C  Recommendations for Outpatient Follow-up:  Follow up with PCP Leavy Mole, PA-C in 1-2 weeks Follow w/ oncology Will need follow up BMP and CBC will need follow up BP check     Discharge Instructions     Diet general   Complete by: As directed    Increase activity slowly   Complete by: As directed          Discharge Diagnoses: Principal Problem:   Hyponatremia Active Problems:   Malnutrition of moderate degree      Hospital course / significant events:   HPI: Colleen [name] is a 78 y.o. female with Past medical history of pancreatic Mass with liver mets, status post biliary stent insertion, A-fib and PE on Eliquis , HTN, HLD, pre-DM, COPD/asthma, CKD 3, gout, GERD, lumbar spinal stenosis, as reviewed from EMR, presented at [facility] ED with complaining of decreased oral intake for several days and feeling weak and tired.  Patient is feeling dizzy and unable to ambulate. Sent to ED from oncology office w/ tachycardia and presyncope / AMS with reduced responsiveness  08/05: to ED, hypotensive BP 67/32, improved after IV fluids. Hyponatremia sodium 124. WBC 17.5. Admitted to hospitalist 08/06: sodium improving 129, d/c IV NS this afternoon and continue to monitor   08/07: sodium up to 134, ALP and Bili trending down appropriately. WBC WNL. Pt feeling well and eating better, feels ready to go home. Will need close follow up      Consultants:  none  Procedures/Surgeries: none      ASSESSMENT & PLAN:   Hyponatremia Poor po intake, question dehydration / salt deficient diet. Also on  hydrochlorothiazide   salt tablets 2 g p.o. twice daily Monitor sodium level outpatient Stay off hydrochlorothiazide      Hypotension Presented with presyncope Essential HTN on antihypertensive  Mildly elevated troponin most likely due to demand ischemia BP improved after IV fluid given in the ED Held antihypertensive medications for now Resume as needed, DC hydrochlorothiazide  on discharge to prevent electrolyte imbalance in future   Hypokalemia, potassium repleted. Hypophosphatemia, Phos repleted. Monitor electrolytes outpatient    Leukocytosis most likely reactive, procalcitonin negative Has done fine off antibiotics, reassess as needed    Pancreatic mass with biliary stenosis status post percutaneous biliary stent insertion.  Catheter is intact. Patient is following oncology as an outpatient  No active issues.    A-fib and PE on Eliquis ,  Eliquis     pre-DM, BG stable, not on any medication Follow outpatient    COPD/asthma, no exacerbation on exam Resumed home inhalers   CKD 3 Monitor BMP   Gout;  allopurinol    GERD PPI   Lumbar spinal stenosis:  prn pain meds   Constipation:  Prn laxatives   Malnutrition moderate Dietician following    overweight based on BMI: Body mass index is 25.75 kg/m.Colleen [name] Significantly low or high BMI is associated with higher medical risk.  Underweight - under 18  overweight - 25 to 29 obese - 30 or more Class 1 obesity: BMI of 30.0 to 34 Class 2 obesity: BMI of 35.0 to 39 Class 3 obesity:  BMI of 40.0 to 49 Super Morbid Obesity: BMI 50-59 Super-super Morbid Obesity: BMI 60+ Healthy nutrition and physical activity advised as adjunct to other disease management and risk reduction treatments              Discharge Instructions  Allergies as of 09/01/2023       Reactions   Nsaids    Other Reaction(s): Not available        Medication List     PAUSE taking these medications    enalapril  2.5 MG tablet Wait to  take this until your doctor or other care provider tells you to start again. Commonly known as: Vasotec  Take 1 tablet (2.5 mg total) by mouth daily.       STOP taking these medications    hydrochlorothiazide  12.5 MG capsule Commonly known as: MICROZIDE        TAKE these medications    acetaminophen  650 MG CR tablet Commonly known as: TYLENOL  Take 650 mg by mouth every 8 (eight) hours as needed for pain.   Adult Blood Pressure Cuff Lg Kit 1 each by Does not apply route daily.   albuterol  108 (90 Base) MCG/ACT inhaler Commonly known as: VENTOLIN  HFA Inhale 2 puffs into the lungs every 4 (four) hours as needed for wheezing or shortness of breath.   allopurinol  100 MG tablet Commonly known as: ZYLOPRIM  Take 2 tablets (200 mg total) by mouth daily.   Breztri  Aerosphere 160-9-4.8 MCG/ACT Aero inhaler Generic drug: budesonide -glycopyrrolate -formoterol  INHALE 2 PUFFS TWICE DAILY   cholecalciferol 25 MCG (1000 UNIT) tablet Commonly known as: VITAMIN D3 Take 1,000 Units by mouth daily.   Eliquis  2.5 MG Tabs tablet Generic drug: apixaban  Take 2.5 mg by mouth 2 (two) times daily.   megestrol  20 MG tablet Commonly known as: MEGACE  Take 1 tablet (20 mg total) by mouth 2 (two) times daily.   Multi-Vitamin tablet Take 1 tablet by mouth.  Take 1 tablet by mouth in the morning. 50 plus.   MULTIVITAMIN WOMEN 50+ PO Take 1 tablet by mouth daily.   ondansetron  4 MG disintegrating tablet Commonly known as: ZOFRAN -ODT Take 1 tablet (4 mg total) by mouth every 8 (eight) hours as needed for nausea or vomiting.   pantoprazole  40 MG tablet Commonly known as: PROTONIX  Take 1 tablet (40 mg total) by mouth daily.   simethicone  80 MG chewable tablet Commonly known as: Gas-X Chew 1 tablet (80 mg total) by mouth 2 (two) times a day.   sodium chloride  1 g tablet Take 2 tablets (2 g total) by mouth 2 (two) times daily with a meal.   sodium chloride  flush 0.9 % Soln injection Inject  10 mLs into the vein daily.   triamcinolone  cream 0.1 % Commonly known as: KENALOG  APPLY TO AFFECTED AREA TWICE DAILY IF NEEDED. NO MORE THAN 1 WEEK AT A TIME, THEN TAKE A BREAK FOR 2 WEEKS.         Follow-up Information     Leavy Mole, PA-C Follow up.   Specialty: Family Medicine Why: hospital follow up Contact information: [address] [place] [address] [phone]         Jacobo Evalene PARAS, MD. Schedule an appointment as soon as possible for a visit.   Specialty: Oncology Why: hospital follow up / missed appointment due to hospitalization Contact information: [address] [place] [address] [phone]                 Allergies  Allergen Reactions  Nsaids     Other Reaction(s): Not available     Subjective: pt feeling well this morning, no concerns. States he appetite is improved today and tolerating diet no nausea    Discharge Exam: BP 99/65 (BP Location: Left Arm)   Pulse 65   Temp 98 F (36.7 C)   Resp 16   Ht 5' 4 (1.626 m)   Wt 68 kg   SpO2 94%   BMI 25.75 kg/m  General: Pt is alert, awake, not in acute distress Cardiovascular: RRR, S1/S2 +, no rubs, no gallops Respiratory: CTA bilaterally, no wheezing, no rhonchi Abdominal: Soft, NT, ND, bowel sounds + Extremities: no edema, no cyanosis     The results of significant diagnostics from this hospitalization (including imaging, microbiology, ancillary and laboratory) are listed below for reference.     Microbiology: No results found for this or any previous visit (from the past 240 hours).   Labs: BNP (last 3 results) No results for input(s): BNP in the last 8760 hours. Basic Metabolic Panel: Recent Labs  Lab 08/30/23 1145 08/30/23 1524 08/31/23 0348 09/01/23 0341  NA 124* 127* 129* 134*  K 3.4* 3.0* 3.7 3.4*  CL 81* 87* 97* 103  CO2 25 26 25 24   GLUCOSE 128* 102* 92 112*  BUN 28* 28* 25* 19  CREATININE 1.20* 1.06* 0.74 0.72   CALCIUM  9.4 9.0 8.7* 8.8*  MG 1.9  --  1.8 1.7  PHOS  --  2.4* 2.0* 1.6*   Liver Function Tests: Recent Labs  Lab 08/30/23 1145 08/31/23 0348 09/01/23 0341  AST 40 25 25  ALT 23 19 18   ALKPHOS 211* 176* 139*  BILITOT 2.0* 1.2 1.0  PROT 7.3 6.3* 5.7*  ALBUMIN 3.3* 2.9* 2.6*   No results for input(s): LIPASE, AMYLASE in the last 168 hours. No results for input(s): AMMONIA in the last 168 hours. CBC: Recent Labs  Lab 08/30/23 1145 08/31/23 0348 09/01/23 0341  WBC 17.5* 13.4* 9.6  NEUTROABS 13.0*  --   --   HGB 14.2 12.1 11.9*  HCT 41.0 35.5* 34.4*  MCV 88.9 89.4 89.1  PLT 344 279 245   Cardiac Enzymes: No results for input(s): CKTOTAL, CKMB, CKMBINDEX, TROPONINI in the last 168 hours. BNP: Invalid input(s): POCBNP CBG: Recent Labs  Lab 08/31/23 0800  GLUCAP 79   D-Dimer No results for input(s): DDIMER in the last 72 hours. Hgb A1c No results for input(s): HGBA1C in the last 72 hours. Lipid Profile No results for input(s): CHOL, HDL, LDLCALC, TRIG, CHOLHDL, LDLDIRECT in the last 72 hours. Thyroid  function studies No results for input(s): TSH, T4TOTAL, T3FREE, THYROIDAB in the last 72 hours.  Invalid input(s): FREET3 Anemia work up No results for input(s): VITAMINB12, FOLATE, FERRITIN, TIBC, IRON, RETICCTPCT in the last 72 hours. Urinalysis    Component Value Date/Time   COLORURINE YELLOW (A) 08/30/2023 1539   APPEARANCEUR CLEAR (A) 08/30/2023 1539   LABSPEC 1.010 08/30/2023 1539   PHURINE 6.0 08/30/2023 1539   GLUCOSEU NEGATIVE 08/30/2023 1539   HGBUR NEGATIVE 08/30/2023 1539   BILIRUBINUR NEGATIVE 08/30/2023 1539   BILIRUBINUR negative 07/26/2022 1046   KETONESUR 5 (A) 08/30/2023 1539   PROTEINUR NEGATIVE 08/30/2023 1539   UROBILINOGEN 0.2 07/26/2022 1046   NITRITE NEGATIVE 08/30/2023 1539   LEUKOCYTESUR NEGATIVE 08/30/2023 1539   Sepsis Labs Recent Labs  Lab 08/30/23 1145 08/31/23 0348  09/01/23 0341  WBC 17.5* 13.4* 9.6   Microbiology No results found for this or any previous visit (from  the past 240 hours). Imaging No results found.    Time coordinating discharge: over 30 minutes  SIGNED:  Rambo Sarafian DO [facility]

## 2023-09-01 NOTE — Plan of Care (Signed)
  Problem: Health Behavior/Discharge Planning: Goal: Ability to manage health-related needs will improve Outcome: Progressing   Problem: Clinical Measurements: Goal: Ability to maintain clinical measurements within normal limits will improve Outcome: Progressing Goal: Respiratory complications will improve Outcome: Progressing Goal: Cardiovascular complication will be avoided Outcome: Progressing   Problem: Activity: Goal: Risk for activity intolerance will decrease Outcome: Adequate for Discharge

## 2023-09-02 ENCOUNTER — Other Ambulatory Visit: Payer: Self-pay

## 2023-09-02 ENCOUNTER — Telehealth: Payer: Self-pay

## 2023-09-02 DIAGNOSIS — R269 Unspecified abnormalities of gait and mobility: Secondary | ICD-10-CM

## 2023-09-02 NOTE — Telephone Encounter (Signed)
 Discharged from hospital. Scheduling notified to reschedule with Dr. Jacobo.

## 2023-09-02 NOTE — Telephone Encounter (Signed)
 Please send home health orders to: [facility] called needing: Patients pt and ot for home health.  Callback# [phone] Fax [phone]

## 2023-09-02 NOTE — Telephone Encounter (Signed)
 Orders faxed [phone]   Copied from CRM [id number]. Topic: General - Other >> Sep 01, 2023  4:32 PM Marissa P wrote: Reason for CRM: [facility] called needing: Patients pt and ot for home health.  Callback# [phone] Needs to be written start of care Mondays date if not received today

## 2023-09-05 ENCOUNTER — Other Ambulatory Visit: Payer: Self-pay | Admitting: *Deleted

## 2023-09-05 ENCOUNTER — Ambulatory Visit

## 2023-09-05 ENCOUNTER — Telehealth: Payer: Self-pay

## 2023-09-05 ENCOUNTER — Inpatient Hospital Stay: Admitting: Family Medicine

## 2023-09-05 DIAGNOSIS — K8689 Other specified diseases of pancreas: Secondary | ICD-10-CM

## 2023-09-05 DIAGNOSIS — M109 Gout, unspecified: Secondary | ICD-10-CM | POA: Diagnosis not present

## 2023-09-05 DIAGNOSIS — J4489 Other specified chronic obstructive pulmonary disease: Secondary | ICD-10-CM | POA: Diagnosis not present

## 2023-09-05 DIAGNOSIS — I4891 Unspecified atrial fibrillation: Secondary | ICD-10-CM | POA: Diagnosis not present

## 2023-09-05 DIAGNOSIS — K219 Gastro-esophageal reflux disease without esophagitis: Secondary | ICD-10-CM | POA: Diagnosis not present

## 2023-09-05 DIAGNOSIS — E871 Hypo-osmolality and hyponatremia: Secondary | ICD-10-CM | POA: Diagnosis not present

## 2023-09-05 DIAGNOSIS — N183 Chronic kidney disease, stage 3 unspecified: Secondary | ICD-10-CM | POA: Diagnosis not present

## 2023-09-05 DIAGNOSIS — I129 Hypertensive chronic kidney disease with stage 1 through stage 4 chronic kidney disease, or unspecified chronic kidney disease: Secondary | ICD-10-CM | POA: Diagnosis not present

## 2023-09-05 DIAGNOSIS — R7303 Prediabetes: Secondary | ICD-10-CM | POA: Diagnosis not present

## 2023-09-05 DIAGNOSIS — E785 Hyperlipidemia, unspecified: Secondary | ICD-10-CM | POA: Diagnosis not present

## 2023-09-05 DIAGNOSIS — I959 Hypotension, unspecified: Secondary | ICD-10-CM | POA: Diagnosis not present

## 2023-09-05 NOTE — Telephone Encounter (Signed)
 Verbal orders given

## 2023-09-05 NOTE — Telephone Encounter (Signed)
 Copied from CRM #[id number]. Topic: Clinical - Home Health Verbal Orders >> Sep 05, 2023 12:25 PM Myrick T wrote: Caller/Agency: Elm[facility] from [facility] Callback Number: [phone] Service Requested: Physical Therapy Frequency: 2x3w; 1x1w Any new concerns about the patient? No

## 2023-09-06 ENCOUNTER — Inpatient Hospital Stay

## 2023-09-06 ENCOUNTER — Encounter: Payer: Self-pay | Admitting: Oncology

## 2023-09-06 ENCOUNTER — Inpatient Hospital Stay (HOSPITAL_BASED_OUTPATIENT_CLINIC_OR_DEPARTMENT_OTHER): Admitting: Oncology

## 2023-09-06 ENCOUNTER — Telehealth: Payer: Self-pay

## 2023-09-06 VITALS — BP 91/58 | HR 96 | Temp 97.0°F | Wt 174.0 lb

## 2023-09-06 DIAGNOSIS — C259 Malignant neoplasm of pancreas, unspecified: Secondary | ICD-10-CM

## 2023-09-06 DIAGNOSIS — I10 Essential (primary) hypertension: Secondary | ICD-10-CM | POA: Diagnosis not present

## 2023-09-06 DIAGNOSIS — Z5111 Encounter for antineoplastic chemotherapy: Secondary | ICD-10-CM | POA: Diagnosis not present

## 2023-09-06 DIAGNOSIS — K8689 Other specified diseases of pancreas: Secondary | ICD-10-CM

## 2023-09-06 LAB — CBC WITH DIFFERENTIAL/PLATELET
Abs Immature Granulocytes: 0.03 K/uL (ref 0.00–0.07)
Basophils Absolute: 0.1 K/uL (ref 0.0–0.1)
Basophils Relative: 1 %
Eosinophils Absolute: 0.1 K/uL (ref 0.0–0.5)
Eosinophils Relative: 1 %
HCT: 36 % (ref 36.0–46.0)
Hemoglobin: 12.2 g/dL (ref 12.0–15.0)
Immature Granulocytes: 0 %
Lymphocytes Relative: 28 %
Lymphs Abs: 2.4 K/uL (ref 0.7–4.0)
MCH: 30.8 pg (ref 26.0–34.0)
MCHC: 33.9 g/dL (ref 30.0–36.0)
MCV: 90.9 fL (ref 80.0–100.0)
Monocytes Absolute: 0.5 K/uL (ref 0.1–1.0)
Monocytes Relative: 6 %
Neutro Abs: 5.4 K/uL (ref 1.7–7.7)
Neutrophils Relative %: 64 %
Platelets: 275 K/uL (ref 150–400)
RBC: 3.96 MIL/uL (ref 3.87–5.11)
RDW: 13.8 % (ref 11.5–15.5)
WBC: 8.4 K/uL (ref 4.0–10.5)
nRBC: 0 % (ref 0.0–0.2)

## 2023-09-06 LAB — CMP (CANCER CENTER ONLY)
ALT: 17 U/L (ref 0–44)
AST: 24 U/L (ref 15–41)
Albumin: 2.8 g/dL — ABNORMAL LOW (ref 3.5–5.0)
Alkaline Phosphatase: 141 U/L — ABNORMAL HIGH (ref 38–126)
Anion gap: 7 (ref 5–15)
BUN: 15 mg/dL (ref 8–23)
CO2: 22 mmol/L (ref 22–32)
Calcium: 8.7 mg/dL — ABNORMAL LOW (ref 8.9–10.3)
Chloride: 105 mmol/L (ref 98–111)
Creatinine: 0.78 mg/dL (ref 0.44–1.00)
GFR, Estimated: >60 mL/min (ref >60)
Glucose, Bld: 126 mg/dL — ABNORMAL HIGH (ref 70–99)
Potassium: 3.2 mmol/L — ABNORMAL LOW (ref 3.5–5.1)
Sodium: 134 mmol/L — ABNORMAL LOW (ref 135–145)
Total Bilirubin: 0.9 mg/dL (ref 0.0–1.2)
Total Protein: 6.2 g/dL — ABNORMAL LOW (ref 6.5–8.1)

## 2023-09-06 LAB — MAGNESIUM: Magnesium: 1.5 mg/dL — ABNORMAL LOW (ref 1.7–2.4)

## 2023-09-06 NOTE — Progress Notes (Signed)
 [facility]  [phone] [phone]) [phone]  ID: Colleen [name] OB: [birth year]  MR#: [id number]  CSN#:[id number]  Patient Care Team: Leavy Mole, PA-C as PCP - General (Family Medicine) Darliss Rogue, MD as PCP - Cardiology (Cardiology) Dave Agent, MD as Consulting Physician (Rheumatology) Emilio Garrie BRAVO, RN (Inactive) as Registered Nurse Ceola Tereasa PARAS, MD as Referring Physician (Oncology) Therisa Bi, MD as Consulting Physician (Gastroenterology) Maurie Rayfield BIRCH, RN as Oncology Nurse Navigator Jacobo, Evalene PARAS, MD as Consulting Physician (Oncology)  CHIEF COMPLAINT: Stage IV pancreatic adenocarcinoma.  INTERVAL HISTORY: Patient returns to clinic today for further evaluation, discussion of her PET scan and biopsy results, and treatment plan.  She feels improved over the last week when she was transported to the emergency room.  She continues to have fatigue, but otherwise feels well.  She has no further dizziness and denies any neurologic complaints.  She has no chest pain, shortness of breath, cough, or hemoptysis.  She denies any nausea, vomiting, constipation, or diarrhea.  She has no urinary complaints.  Patient offers no further specific complaints today.    REVIEW OF SYSTEMS:   Review of Systems  Constitutional:  Positive for malaise/fatigue. Negative for fever and weight loss.  Respiratory: Negative.  Negative for cough, hemoptysis and shortness of breath.   Cardiovascular: Negative.  Negative for chest pain and leg swelling.  Gastrointestinal: Negative.  Negative for abdominal pain.  Genitourinary: Negative.  Negative for dysuria.  Musculoskeletal: Negative.  Negative for back pain.  Skin: Negative.  Negative for rash.  Neurological: Negative.  Negative for dizziness, focal weakness, weakness and headaches.  Psychiatric/Behavioral: Negative.  The patient is not nervous/anxious.     As per HPI. Otherwise, a complete review of  systems is negative.  PAST MEDICAL HISTORY: Past Medical History:  Diagnosis Date   Asthma    Cataract    COPD (chronic obstructive pulmonary disease) (HCC)    GERD (gastroesophageal reflux disease)    Gout    History of pulmonary embolus (PE)    Hyperlipidemia    Hypertension    Osteoarthrosis    Prediabetes     PAST SURGICAL HISTORY: Past Surgical History:  Procedure Laterality Date   BACK SURGERY     BREAST BIOPSY Left 04/08/2014   intraductal papilloma 5 mm   COLONOSCOPY WITH PROPOFOL  N/A 04/23/2020   Procedure: COLONOSCOPY WITH PROPOFOL ;  Surgeon: Janalyn Keene NOVAK, MD;  Location: [facility] ENDOSCOPY;  Service: Endoscopy;  Laterality: N/A;   ERCP N/A 08/05/2023   Procedure: ERCP, WITH INTERVENTION IF INDICATED;  Surgeon: Jinny Carmine, MD;  Location: [facility] ENDOSCOPY;  Service: Endoscopy;  Laterality: N/A;   ESOPHAGOGASTRODUODENOSCOPY (EGD) WITH PROPOFOL  N/A 04/23/2020   Procedure: ESOPHAGOGASTRODUODENOSCOPY (EGD) WITH PROPOFOL ;  Surgeon: Janalyn Keene NOVAK, MD;  Location: [facility] ENDOSCOPY;  Service: Endoscopy;  Laterality: N/A;   GIVENS CAPSULE STUDY N/A 01/28/2022   Procedure: GIVENS CAPSULE STUDY;  Surgeon: Therisa Bi, MD;  Location: [facility] ENDOSCOPY;  Service: Gastroenterology;  Laterality: N/A;   IR INT EXT BILIARY DRAIN WITH CHOLANGIOGRAM  08/08/2023   LUMBAR LAMINECTOMY     TONSILLECTOMY      FAMILY HISTORY: Family History  Problem Relation Age of Onset   Diabetes Mother    Lung cancer Father    Diabetes Sister    Congestive Heart Failure Sister    Glaucoma Sister    Breast cancer Maternal Aunt     ADVANCED DIRECTIVES (Y/N):  N  HEALTH MAINTENANCE: Social History   Tobacco Use  Smoking status: Former    Current packs/day: 0.00    Average packs/day: 1.5 packs/day for 20.0 years (30.0 ttl pk-yrs)    Types: Cigarettes    Start date: 11/13/1988    Quit date: 11/13/2008    Years since quitting: 14.8   Smokeless tobacco: Never   Tobacco comments:    smoking  cessation materials not required  Vaping Use   Vaping status: Never Used  Substance Use Topics   Alcohol use: Not Currently   Drug use: No     Colonoscopy:  PAP:  Bone density:  Lipid panel:  Allergies  Allergen Reactions   Nsaids     Other Reaction(s): Not available    Current Outpatient Medications  Medication Sig Dispense Refill   acetaminophen  (TYLENOL ) 650 MG CR tablet Take 650 mg by mouth every 8 (eight) hours as needed for pain.     albuterol  (VENTOLIN  HFA) 108 (90 Base) MCG/ACT inhaler Inhale 2 puffs into the lungs every 4 (four) hours as needed for wheezing or shortness of breath. 8 g 2   allopurinol  (ZYLOPRIM ) 100 MG tablet Take 2 tablets (200 mg total) by mouth daily. 180 tablet 1   apixaban  (ELIQUIS ) 2.5 MG TABS tablet Take 2.5 mg by mouth 2 (two) times daily.     Blood Pressure Monitoring (ADULT BLOOD PRESSURE CUFF LG) KIT 1 each by Does not apply route daily. 1 kit 0   BREZTRI  AEROSPHERE 160-9-4.8 MCG/ACT AERO inhaler INHALE 2 PUFFS TWICE DAILY 33 g 0   cholecalciferol (VITAMIN D3) 25 MCG (1000 UNIT) tablet Take 1,000 Units by mouth daily.     [Paused] enalapril  (VASOTEC ) 2.5 MG tablet Take 1 tablet (2.5 mg total) by mouth daily. 90 tablet 3   megestrol  (MEGACE ) 20 MG tablet Take 1 tablet (20 mg total) by mouth 2 (two) times daily. 60 tablet 0   Multiple Vitamin (MULTI-VITAMIN) tablet Take 1 tablet by mouth.  Take 1 tablet by mouth in the morning. 50 plus.     Multiple Vitamins-Minerals (MULTIVITAMIN WOMEN 50+ PO) Take 1 tablet by mouth daily.     ondansetron  (ZOFRAN -ODT) 4 MG disintegrating tablet Take 1 tablet (4 mg total) by mouth every 8 (eight) hours as needed for nausea or vomiting. 20 tablet 0   pantoprazole  (PROTONIX ) 40 MG tablet Take 1 tablet (40 mg total) by mouth daily. 30 tablet 3   simethicone  (GAS-X) 80 MG chewable tablet Chew 1 tablet (80 mg total) by mouth 2 (two) times a day. 60 tablet 2   sodium chloride  1 g tablet Take 2 tablets (2 g total) by  mouth 2 (two) times daily with a meal. 120 tablet 0   Sodium Chloride  Flush (NORMAL SALINE FLUSH) 0.9 % SOLN Inject 10 mLs into the vein daily. 300 mL 1   triamcinolone  cream (KENALOG ) 0.1 % APPLY TO AFFECTED AREA TWICE DAILY IF NEEDED. NO MORE THAN 1 WEEK AT A TIME, THEN TAKE A BREAK FOR 2 WEEKS. 80 g 0   lidocaine -prilocaine  (EMLA ) cream Apply to affected area once 30 g 3   ondansetron  (ZOFRAN ) 8 MG tablet Take 1 tablet (8 mg total) by mouth every 8 (eight) hours as needed for nausea or vomiting. 60 tablet 2   prochlorperazine  (COMPAZINE ) 10 MG tablet Take 1 tablet (10 mg total) by mouth every 6 (six) hours as needed for nausea or vomiting. 60 tablet 2   No current facility-administered medications for this visit.    OBJECTIVE: Vitals:   09/06/23 1320  BP: ROLLEN)  91/58  Pulse: 96  Temp: (!) 97 F (36.1 C)  SpO2: 99%     Body mass index is 29.87 kg/m.    ECOG FS:1 - Symptomatic but completely ambulatory  General: Well-developed, well-nourished, no acute distress.  Sitting in a wheelchair. Eyes: Pink conjunctiva, anicteric sclera. HEENT: Normocephalic, moist mucous membranes. Lungs: No audible wheezing or coughing. Heart: Regular rate and rhythm. Abdomen: Soft, nontender, no obvious distention. Musculoskeletal: No edema, cyanosis, or clubbing. Neuro: Alert, answering all questions appropriately. Cranial nerves grossly intact. Skin: No rashes or petechiae noted. Psych: Normal affect.  LAB RESULTS:  Lab Results  Component Value Date   NA 134 (L) 09/06/2023   K 3.2 (L) 09/06/2023   CL 105 09/06/2023   CO2 22 09/06/2023   GLUCOSE 126 (H) 09/06/2023   BUN 15 09/06/2023   CREATININE 0.78 09/06/2023   CALCIUM  8.7 (L) 09/06/2023   PROT 6.2 (L) 09/06/2023   ALBUMIN 2.8 (L) 09/06/2023   AST 24 09/06/2023   ALT 17 09/06/2023   ALKPHOS 141 (H) 09/06/2023   BILITOT 0.9 09/06/2023   GFRNONAA >60 09/06/2023   GFRAA 58 (L) 02/28/2020    Lab Results  Component Value Date   WBC 8.4  09/06/2023   NEUTROABS 5.4 09/06/2023   HGB 12.2 09/06/2023   HCT 36.0 09/06/2023   MCV 90.9 09/06/2023   PLT 275 09/06/2023     STUDIES: NM PET Image Initial (PI) Skull Base To Thigh Result Date: 08/23/2023 CLINICAL DATA:  Initial treatment strategy for pancreatic mass. EXAM: NUCLEAR MEDICINE PET SKULL BASE TO THIGH TECHNIQUE: 9.14 mCi F-18 FDG was injected intravenously. Full-ring PET imaging was performed from the skull base to thigh after the radiotracer. CT data was obtained and used for attenuation correction and anatomic localization. Fasting blood glucose: 83 mg/dl COMPARISON:  MRI 92/89/7974.  Ultrasound 08/04/2023. FINDINGS: Mediastinal blood pool activity: SUV max 3.2 Liver activity: SUV max 3.6 NECK: There is a small area of uptake identified along the thoracic along the right side maximum SUV 7.1. There is a very small lymph node in this location on image 29 measuring 3 mm in short axis. This could be an atypical pathologic node. No additional areas of abnormal uptake elsewhere along lymph node chains in the neck. There is some misregistration between PET and CT scan with some motion. Grossly the visualized intracranial compartment has near symmetric uptake. Incidental CT findings: Area paranasal sinuses and mastoid air cells are clear. The parotid glands, submandibular glands and thyroid  gland is preserved. Mild vascular calcifications are seen. CHEST: No specific abnormal uptake above blood pool in the axillary regions, hilum or mediastinum. No areas of abnormal lung uptake. Incidental CT findings: Heart is nonenlarged. No pericardial effusion. Normal caliber thoracic aorta with some scattered vascular calcifications. There is also some coronary artery calcifications as well as some mild calcifications along the great vessels. Slightly patulous thoracic esophagus. Mild linear opacity along the left lung base likely scar or atelectasis. No pleural effusion. ABDOMEN/PELVIS: There are 3  lesions seen in the liver. Two dome lesions on the right side are included. The anterior lesion has maximum SUV of 6.9 on image 68 in the posterior lesion maximum SUV of 6.3 on image 66. These are quite small, under a cm. There is a segment 4B lesion as well with maximum SUV of 6.7 on image 81. Subtle liver metastases are possible. Attention on follow-up. There is significant abnormal uptake corresponding to the pancreatic head mass. This has maximum SUV of 26.0  there on prior MRI measured 2.8 x 1.8 cm. Again pancreatic parenchymal atrophy and ductal dilatation identified. No additional areas of abnormal uptake. Physiologic distribution radiotracer seen along the other parenchymal organs, bowel and renal collecting systems. No definite areas of abnormal nodal uptake. Incidental CT findings: Left-sided PTC catheter in place. Pigtail extends to the 2/3 portion of the duodenum. Air in the biliary tree. Distended gallbladder. Surgical changes in the left upper quadrant involving the proximal stomach and adjacent tissues. The stomach, small and large bowel are nondilated. Moderate colonic stool with significant stool in the rectum. Scattered vascular calcifications. No abnormal calcifications seen within either kidney nor along the course of either ureter. Lobular uterus with multiple calcified fibroids. Spleen, adrenal glands are preserved. SKELETON: No focal hypermetabolic activity to suggest skeletal metastasis. Incidental CT findings: Scattered degenerative changes identified. Multilevel stenosis. Sclerosis identified along the right femoral head. IMPRESSION: Known hypermetabolic pancreatic head mass as seen previously. There are 3 small areas of asymmetric increased uptake in the liver which could be small hepatic metastases. Attention on follow-up. Solitary area of uptake the thoracic inlet on the right side correspond a very small lymph node. Atypical lymph node metastasis is possible. Attention on short  follow-up. PTC catheter in place with air in the biliary tree. Uterine fibroids. Colonic sigmoid diverticula. Significant colonic stool in the rectum. Electronically Signed   By: Ranell Bring M.D.   On: 08/23/2023 16:10   IR INT EXT BILIARY DRAIN WITH CHOLANGIOGRAM Result Date: 08/08/2023 INDICATION: Pancreatic mass with CBD compression/obstruction. EXAM: Procedures; 1. ULTRASOUND AND FLUOROSCOPIC GUIDED PERCUTANEOUS TRANSHEPATIC CHOLANGIOGRAM AND BILIARY TUBE PLACEMENT 2. FLUOROSCOPIC-GUIDED COMMON BILE DUCT BRUSH BIOPSIES COMPARISON:  MRI ABDOMEN, 08/04/2023.  ABDOMINAL CT, 08/04/2018. MEDICATIONS: Ancef  2 gm IV; The antibiotic was administered with an appropriate time frame prior to the initiation of the procedure 4 g Zofran  IV CONTRAST:  50mL OMNIPAQUE  IOHEXOL  300 MG/ML SOLN - administered into the biliary tree. ANESTHESIA/SEDATION: Moderate (conscious) sedation was employed during this procedure. A total of Versed  2 mg and Fentanyl  200 mcg was administered intravenously. Moderate Sedation Time: 65 minutes. The patient's level of consciousness and vital signs were monitored continuously by radiology nursing throughout the procedure under my direct supervision. FLUOROSCOPY TIME:  Radiation Exposure Index and estimated peak skin dose (PSD); Reference air kerma (RAK), 17.2 mGy. COMPLICATIONS: None immediate. TECHNIQUE: Informed written consent was obtained from the patient and/or patient's representative after a discussion of the risks, benefits and alternatives to treatment. Questions regarding the procedure were encouraged and answered. A timeout was performed prior to the initiation of the procedure. The right upper abdominal quadrant was prepped and draped in the usual sterile fashion, and a sterile drape was applied covering the operative field. Maximum barrier sterile technique with sterile gowns and gloves were used for the procedure. A timeout was performed prior to the initiation of the procedure.  Ultrasound scanning of the right upper abdominal quadrant was performed to delineate the anatomy and avoid transgression of the gallbladder or the pleura. After the overlying soft tissues were anesthetized with 1% Lidocaine  with epinephrine , under direct ultrasound guidance, a 22 gauge Chiba needle was utilized to cannulate the peripheral aspect of a LEFT intrahepatic biliary duct. Appropriate position was confirmed with limited contrast injection. Next, the duct was cannulated with a Nitrex wire and dilated with an Accustick set under fluoroscopic guidance. Limited cholangiograms were performed in various obliquities confirming appropriate access. Next, a 4 Fr angled glide catheter was advanced through the outer sheath of  the Accustick set and with the use of a stiff Glidewire, advanced through the biliary hilum, common bile duct and ampulla to the level of the duodenum. Contrast injection confirmed appropriate positioning. Attention was then directed to the breast biopsy. Using a coaxial brush biopsy kit, multiple passes of the distal common bile duct or performed. The burst within sheath and removed. Under intermittent fluoroscopic guidance and over an Amplatz wire, the track was dilated ultimately allowing placement of a 10 Fr biliary drainage catheter with coil ultimately locked within the duodenum. Contrast was injected and a completion radiographs were obtained in various obliquities. The catheter was connected to a drainage bag which yielded the brisk return of clear bile. The catheter was secured to the skin with an interrupted suture and StatLock device. Dressings were applied. The patient tolerated the procedure well without immediate postprocedural complication. FINDINGS: *Sonographic evaluation of the liver demonstrates intrahepatic biliary ductal dilatation as was demonstrated on preceding abdominal CT. *Under direct ultrasound guidance, a dilated peripheral duct within the LEFT lobe of the liver was  accessed allowing placement of a 10 Fr biliary drainage catheter with end ultimately coiled and locked within the duodenum and radiopaque side marker located proximal to the level of the biliary hilum. *Limited contrast injection demonstrates marked dilatation of the CBD and intrahepatic biliary tree with communication between the right and left biliary trees at the level of the hilum. *Suspected malignant shouldering involving the distal aspect of the CBD, with brush biopsies performed as requested. Additionally, 30 mL of bile was submitted for cytological analysis. IMPRESSION: 1. Successful placement of a 10 Fr percutaneous biliary drainage catheter via a LEFT trans hepatic approach, with end coiled and locked within the duodenum. 2. Distal common bile duct stricture, with brush biopsies performed and additionally, bile submitted for cytological analysis. RECOMMENDATIONS: Patient will return to Vascular Interventional Radiology (VIR) for routine drainage catheter evaluation and exchange in 6-8 weeks. Thom Hall, MD [facility] Radiology Electronically Signed   By: Thom Hall M.D.   On: 08/08/2023 17:07    ASSESSMENT: Stage IV pancreatic adenocarcinoma.  PLAN:    Stage IV pancreatic adenocarcinoma: EUS at [facility] confirmed diagnosis.  PET scan results from August 22, 2023 reviewed independently with 3 hypermetabolic lesions in patient's liver consistent with metastatic disease.  Patient's most recent CA 19-9 is 5172.  After lengthy discussion with the patient, she wishes to pursue palliative chemotherapy using weekly gemcitabine and Abraxane on days 1, 8, and 15 with a 22.  This will be a 28-day cycle.  Patient require port placement prior to initiating treatment.  Return to clinic in 2 weeks for further evaluation and consideration of cycle 1, day 1.  Hyperbilirubinemia: Resolved.  Patient has an external drain in place.  She has an appointment with  interventional radiology on September 22, 2023 for drain removal.   Hyponatremia: Sodium level improved to 134.  Monitor.   Hypokalemia: Mild.  Patient potassium is 3.2 today.  Monitor.   Renal insufficiency: Resolved. Transaminitis: Resolved. Anemia: Resolved.  Patient's most recent hemoglobin is 12.2.  Patient expressed understanding and was in agreement with this plan. She also understands that She can call clinic at any time with any questions, concerns, or complaints.    Cancer Staging  Adenocarcinoma of pancreas [facility]) Staging form: Exocrine Pancreas, AJCC 8th Edition - Clinical stage from 09/07/2023: Stage IV (cT2, cN0, cM1) - Signed by Jacobo Evalene PARAS, MD on 09/07/2023 Stage prefix: Initial diagnosis Total positive  nodes: 0   Evalene JINNY Reusing, MD   09/07/2023 8:29 AM

## 2023-09-06 NOTE — Patient Instructions (Signed)
 Port a cath placement scheduled for (pending)  Arrive at (pending)  for (pending) appointment Come into the Heart and Vascular entrance at [facility]. This entrance is located in the front of the hospital. Do not eat or drink anything after midnight Take your regularly scheduled blood pressure, pain, or seizure medication. You do not need to hold you blood thinners. You will need a driver for this procedure.

## 2023-09-06 NOTE — Progress Notes (Signed)
 Dressing changed to biliary drain site. Site clean with no drainage, redness or tenderness. She has an appointment with IR 8/28 to potentially have drain removed. Educated on port placement instructions and provided in AVS. We will call her the date/time once scheduled

## 2023-09-07 ENCOUNTER — Ambulatory Visit

## 2023-09-07 ENCOUNTER — Encounter: Payer: Self-pay | Admitting: Oncology

## 2023-09-07 DIAGNOSIS — C259 Malignant neoplasm of pancreas, unspecified: Secondary | ICD-10-CM | POA: Insufficient documentation

## 2023-09-07 MED ORDER — PROCHLORPERAZINE MALEATE 10 MG PO TABS: 10.0000 mg | ORAL_TABLET | Freq: Four times a day (QID) | ORAL | 2 refills | Status: DC | PRN

## 2023-09-07 MED ORDER — ONDANSETRON HCL 8 MG PO TABS: 8.0000 mg | ORAL_TABLET | Freq: Three times a day (TID) | ORAL | 2 refills | Status: DC | PRN

## 2023-09-07 MED ORDER — LIDOCAINE-PRILOCAINE 2.5-2.5 % EX CREA: TOPICAL_CREAM | CUTANEOUS | 3 refills | Status: DC

## 2023-09-07 NOTE — Telephone Encounter (Unsigned)
 Copied from CRM [id number]. Topic: Referral - Status >> Sep 07, 2023 10:45 AM Porter L wrote: Reason for CRM: ashley from [facility] called and stated patient is refusing care, said she didn't want it or need it, that she wanted to save her energy for Chemo. She only asking for a aide to help her, Rosina was unable to do the elevation today wanted to let pcp know

## 2023-09-07 NOTE — Telephone Encounter (Signed)
 Port a cath placement scheduled for 09/12/2023  Arrive at 1000  for 1100 appointment Come into the Heart and Vascular entrance at [facility]. This entrance is located in the front of the hospital. Do not eat or drink anything after midnight Take your regularly scheduled blood pressure, pain, or seizure medication. You do not need to hold you blood thinners. You will need a driver for this procedure.   Called and reviewed the following with Colleen [name]. All questions answered.

## 2023-09-07 NOTE — Progress Notes (Signed)
 START ON PATHWAY REGIMEN - Pancreatic Adenocarcinoma     A cycle is every 28 days:     Nab-paclitaxel (protein bound)      Gemcitabine   **Always confirm dose/schedule in your pharmacy ordering system**  Patient Characteristics: Metastatic Disease, First Line, PS = 0,1, BRCA1/2 and PALB2  Mutation Absent/Unknown Therapeutic Status: Metastatic Disease Line of Therapy: First Line ECOG Performance Status: 1 BRCA1/2 Mutation Status: Did Not Order Test PALB2 Mutation Status: Did Not Order Test Intent of Therapy: Non-Curative / Palliative Intent, Discussed with Patient

## 2023-09-07 NOTE — Progress Notes (Signed)
 Pharmacist Chemotherapy Monitoring - Initial Assessment    Anticipated start date: 09/20/23   The following has been reviewed per standard work regarding the patient's treatment regimen: The patient's diagnosis, treatment plan and drug doses, and organ/hematologic function Lab orders and baseline tests specific to treatment regimen  The treatment plan start date, drug sequencing, and pre-medications Prior authorization status  Patient's documented medication list, including drug-drug interaction screen and prescriptions for anti-emetics and supportive care specific to the treatment regimen The drug concentrations, fluid compatibility, administration routes, and timing of the medications to be used The patient's access for treatment and lifetime cumulative dose history, if applicable  The patient's medication allergies and previous infusion related reactions, if applicable    Stage IV pancreatic adenocarcinoma: EUS at [facility] confirmed diagnosis   palliative chemotherapy using weekly gemcitabine  and Abraxane  on days 1, 8, and 15 with a 22. This will be a 28-day cycle.    Redell JINNY Gaskins, RPH, 09/07/2023  3:09 PM

## 2023-09-08 ENCOUNTER — Other Ambulatory Visit: Payer: Self-pay

## 2023-09-09 ENCOUNTER — Ambulatory Visit (INDEPENDENT_AMBULATORY_CARE_PROVIDER_SITE_OTHER): Admitting: Family Medicine

## 2023-09-09 ENCOUNTER — Encounter: Payer: Self-pay | Admitting: Family Medicine

## 2023-09-09 ENCOUNTER — Ambulatory Visit: Admitting: Family Medicine

## 2023-09-09 ENCOUNTER — Other Ambulatory Visit: Payer: Self-pay | Admitting: Interventional Radiology

## 2023-09-09 VITALS — BP 118/68 | HR 55 | Resp 16 | Ht 64.0 in | Wt 175.0 lb

## 2023-09-09 DIAGNOSIS — E876 Hypokalemia: Secondary | ICD-10-CM

## 2023-09-09 DIAGNOSIS — E871 Hypo-osmolality and hyponatremia: Secondary | ICD-10-CM | POA: Diagnosis not present

## 2023-09-09 DIAGNOSIS — I10 Essential (primary) hypertension: Secondary | ICD-10-CM

## 2023-09-09 DIAGNOSIS — R630 Anorexia: Secondary | ICD-10-CM

## 2023-09-09 DIAGNOSIS — R1010 Upper abdominal pain, unspecified: Secondary | ICD-10-CM

## 2023-09-09 DIAGNOSIS — R600 Localized edema: Secondary | ICD-10-CM

## 2023-09-09 DIAGNOSIS — K219 Gastro-esophageal reflux disease without esophagitis: Secondary | ICD-10-CM

## 2023-09-09 DIAGNOSIS — C259 Malignant neoplasm of pancreas, unspecified: Secondary | ICD-10-CM

## 2023-09-09 MED ORDER — PANTOPRAZOLE SODIUM 40 MG PO TBEC: 40.0000 mg | DELAYED_RELEASE_TABLET | Freq: Every day | ORAL | 1 refills | Status: DC

## 2023-09-09 NOTE — Progress Notes (Signed)
Name: Colleen [name]   MRN: [id number]    DOB: Feb 24, 1945   Date:09/09/2023       Progress Note  Chief Complaint  Patient presents with   Medical Management of Chronic Issues    1 month follow-up   Edema    In feet, discuss fluid pill     Subjective:   Colleen [name] is a 78 y.o. female, presents to clinic for routine follow up on chronic conditions and f/up on multiple health changes and dx after pancreatic mass was found  Newly dx pancreatic cancer Wishes for home health aid when starting chemo  Edema to legs bothersome however she was hypotensive and had abnormal electrolytes and was taken off some of her meds and diuretics Labs reviewed from cancer center   Discussed the use of AI scribe software for clinical note transcription with the patient, who gave verbal consent to proceed.  History of Present Illness Colleen [name] is a 78 year old female with hypotension and low sodium who presents with concerns about fluid retention and medication management following a recent hospital discharge.  Fluid retention and edema - Experiences fluid retention after discontinuation of medication previously used for fluid management and blood pressure control - Swelling present in feet - Difficulty visualizing veins which concerns her - Can go eight hours or more without urinating  Electrolyte imbalance and hypotension - Recent hospitalization for hypotension and hyponatremia - Current medication regimen includes sodium supplementation (two pills in the morning and two at night)  Medication management - Managing multiple medications, including sodium supplementation and a prescription for appetite stimulation - Requests refill for pantoprazole , taken once daily  Home health care needs - Requires home health care assistance, especially in preparation for upcoming chemotherapy - she has current referral to [facility] PT/OT etc but requests referral for a home health care aide        Current Outpatient Medications:    acetaminophen  (TYLENOL ) 650 MG CR tablet, Take 650 mg by mouth every 8 (eight) hours as needed for pain., Disp: , Rfl:    albuterol  (VENTOLIN  HFA) 108 (90 Base) MCG/ACT inhaler, Inhale 2 puffs into the lungs every 4 (four) hours as needed for wheezing or shortness of breath., Disp: 8 g, Rfl: 2   allopurinol  (ZYLOPRIM ) 100 MG tablet, Take 2 tablets (200 mg total) by mouth daily., Disp: 180 tablet, Rfl: 1   apixaban  (ELIQUIS ) 2.5 MG TABS tablet, Take 2.5 mg by mouth 2 (two) times daily., Disp: , Rfl:    Blood Pressure Monitoring (ADULT BLOOD PRESSURE CUFF LG) KIT, 1 each by Does not apply route daily., Disp: 1 kit, Rfl: 0   BREZTRI  AEROSPHERE 160-9-4.8 MCG/ACT AERO inhaler, INHALE 2 PUFFS TWICE DAILY, Disp: 33 g, Rfl: 0   cholecalciferol (VITAMIN D3) 25 MCG (1000 UNIT) tablet, Take 1,000 Units by mouth daily., Disp: , Rfl:    lidocaine -prilocaine  (EMLA ) cream, Apply to affected area once, Disp: 30 g, Rfl: 3   megestrol  (MEGACE ) 20 MG tablet, Take 1 tablet (20 mg total) by mouth 2 (two) times daily., Disp: 60 tablet, Rfl: 0   Multiple Vitamin (MULTI-VITAMIN) tablet, Take 1 tablet by mouth.  Take 1 tablet by mouth in the morning. 50 plus., Disp: , Rfl:    Multiple Vitamins-Minerals (MULTIVITAMIN WOMEN 50+ PO), Take 1 tablet by mouth daily., Disp: , Rfl:    ondansetron  (ZOFRAN ) 8 MG tablet, Take 1 tablet (8 mg total) by mouth every 8 (eight) hours as needed for nausea  or vomiting., Disp: 60 tablet, Rfl: 2   ondansetron  (ZOFRAN -ODT) 4 MG disintegrating tablet, Take 1 tablet (4 mg total) by mouth every 8 (eight) hours as needed for nausea or vomiting., Disp: 20 tablet, Rfl: 0   pantoprazole  (PROTONIX ) 40 MG tablet, Take 1 tablet (40 mg total) by mouth daily., Disp: 30 tablet, Rfl: 3   prochlorperazine  (COMPAZINE ) 10 MG tablet, Take 1 tablet (10 mg total) by mouth every 6 (six) hours as needed for nausea or vomiting., Disp: 60 tablet, Rfl: 2   simethicone  (GAS-X) 80  MG chewable tablet, Chew 1 tablet (80 mg total) by mouth 2 (two) times a day., Disp: 60 tablet, Rfl: 2   sodium chloride  1 g tablet, Take 2 tablets (2 g total) by mouth 2 (two) times daily with a meal., Disp: 120 tablet, Rfl: 0   Sodium Chloride  Flush (NORMAL SALINE FLUSH) 0.9 % SOLN, Inject 10 mLs into the vein daily., Disp: 300 mL, Rfl: 1   triamcinolone  cream (KENALOG ) 0.1 %, APPLY TO AFFECTED AREA TWICE DAILY IF NEEDED. NO MORE THAN 1 WEEK AT A TIME, THEN TAKE A BREAK FOR 2 WEEKS., Disp: 80 g, Rfl: 0   [Paused] enalapril  (VASOTEC ) 2.5 MG tablet, Take 1 tablet (2.5 mg total) by mouth daily. (Patient not taking: Reported on 09/09/2023), Disp: 90 tablet, Rfl: 3  Patient Active Problem List   Diagnosis Date Noted   Adenocarcinoma of pancreas (HCC) 09/07/2023   Malnutrition of moderate degree 09/01/2023   Hyponatremia 08/30/2023   Abnormal LFTs 08/04/2023   Chronic kidney disease, stage 3a (HCC) 08/04/2023   Obesity (BMI 30-39.9) 08/04/2023   Atrial fibrillation, chronic (HCC) 08/04/2023   Pulmonary embolism (HCC) 08/04/2023   Hypokalemia 08/04/2023   Pancreatic mass 08/04/2023   New onset type 2 diabetes mellitus (HCC) 04/04/2023   Acquired trigger finger of right middle finger 07/24/2021   Acquired trigger finger of right ring finger 07/24/2021   Trigger finger of right hand 06/26/2021   Hidradenitis suppurativa 11/12/2020   Status post gastroplasty-1980s 06/25/2020   CLE (columnar lined esophagus)    Hiatal hernia    Gastric nodule    Angiodysplasia of stomach and duodenum    Cecal polyp    Polyp of descending colon    Polyp of transverse colon    Atrial fibrillation (HCC) 06/14/2019   Current use of anticoagulant therapy 06/14/2019   Iron deficiency anemia 06/14/2019   Essential (hemorrhagic) thrombocythemia (HCC) 06/14/2019   GERD (gastroesophageal reflux disease) 04/15/2019   Chronic low back pain 04/15/2019   Right pulmonary embolus (HCC) 04/15/2019   DOE (dyspnea on  exertion) 03/13/2019   Atherosclerosis of both carotid arteries 09/20/2018   Spinal stenosis of lumbar region with neurogenic claudication 09/20/2018   Aortic atherosclerosis (HCC) 09/05/2018   At high risk for falls 09/05/2018   Osteopenia 05/18/2017   Stage 3 chronic kidney disease (HCC) 04/21/2017   Morbid obesity (HCC) 04/21/2017   Trigger middle finger of left hand 02/28/2017   Chronic gouty arthropathy without tophi 11/29/2016   Hypergammaglobulinemia 11/29/2016   Osteoarthritis of both knees 11/29/2016   Bilateral primary osteoarthritis of knee 11/29/2016   Allergic rhinitis, seasonal 07/30/2014   Gout 07/30/2014   HLD (hyperlipidemia) 07/30/2014   Osteoarthritis of knee 07/30/2014   Prediabetes 07/30/2014   Asthma, mild persistent 07/30/2014   COPD (chronic obstructive pulmonary disease) (HCC) 07/09/2008   Benign essential HTN 06/22/2006    Past Surgical History:  Procedure Laterality Date   BACK SURGERY  BREAST BIOPSY Left 04/08/2014   intraductal papilloma 5 mm   COLONOSCOPY WITH PROPOFOL  N/A 04/23/2020   Procedure: COLONOSCOPY WITH PROPOFOL ;  Surgeon: Janalyn Keene NOVAK, MD;  Location: [facility] ENDOSCOPY;  Service: Endoscopy;  Laterality: N/A;   ERCP N/A 08/05/2023   Procedure: ERCP, WITH INTERVENTION IF INDICATED;  Surgeon: Jinny Carmine, MD;  Location: [facility] ENDOSCOPY;  Service: Endoscopy;  Laterality: N/A;   ESOPHAGOGASTRODUODENOSCOPY (EGD) WITH PROPOFOL  N/A 04/23/2020   Procedure: ESOPHAGOGASTRODUODENOSCOPY (EGD) WITH PROPOFOL ;  Surgeon: Janalyn Keene NOVAK, MD;  Location: [facility] ENDOSCOPY;  Service: Endoscopy;  Laterality: N/A;   GIVENS CAPSULE STUDY N/A 01/28/2022   Procedure: GIVENS CAPSULE STUDY;  Surgeon: Therisa Bi, MD;  Location: [facility] ENDOSCOPY;  Service: Gastroenterology;  Laterality: N/A;   IR INT EXT BILIARY DRAIN WITH CHOLANGIOGRAM  08/08/2023   LUMBAR LAMINECTOMY     TONSILLECTOMY      Family History  Problem Relation Age of Onset   Diabetes Mother     Lung cancer Father    Diabetes Sister    Congestive Heart Failure Sister    Glaucoma Sister    Breast cancer Maternal Aunt     Social History   Tobacco Use   Smoking status: Former    Current packs/day: 0.00    Average packs/day: 1.5 packs/day for 20.0 years (30.0 ttl pk-yrs)    Types: Cigarettes    Start date: 11/13/1988    Quit date: 11/13/2008    Years since quitting: 14.8   Smokeless tobacco: Never   Tobacco comments:    smoking cessation materials not required  Vaping Use   Vaping status: Never Used  Substance Use Topics   Alcohol use: Not Currently   Drug use: No     Allergies  Allergen Reactions   Nsaids     Other Reaction(s): Not available    Health Maintenance  Topic Date Due   Lung Cancer Screening  Never done   OPHTHALMOLOGY EXAM  09/09/2023 (Originally 04/08/1955)   COVID-19 Vaccine (6 - 2024-25 season) 09/24/2023 (Originally 09/26/2022)   INFLUENZA VACCINE  09/25/2023 (Originally 08/26/2023)   DTaP/Tdap/Td (2 - Td or Tdap) 03/29/2024 (Originally 10/12/2021)   Medicare Annual Wellness (AWV)  12/02/2023   HEMOGLOBIN A1C  01/28/2024   FOOT EXAM  03/30/2024   MAMMOGRAM  04/14/2024   Diabetic kidney evaluation - Urine ACR  07/31/2024   Diabetic kidney evaluation - eGFR measurement  09/05/2024   DEXA SCAN  04/14/2025   Colonoscopy  04/23/2025   Pneumococcal Vaccine: 50+ Years  Completed   Hepatitis C Screening  Completed   HPV VACCINES  Aged Out   Meningococcal B Vaccine  Aged Out   Pneumococcal Vaccine  Discontinued   Fecal DNA (Cologuard)  Discontinued   Zoster Vaccines- Shingrix  Discontinued    Chart Review Today: I personally reviewed active problem list, medication list, allergies, family history, social history, health maintenance, notes from last encounter, lab results, imaging with the patient/caregiver today.   Review of Systems  Constitutional: Negative.   HENT: Negative.    Eyes: Negative.   Respiratory: Negative.    Cardiovascular:  Negative.   Gastrointestinal: Negative.   Endocrine: Negative.   Genitourinary: Negative.   Musculoskeletal: Negative.   Skin: Negative.   Allergic/Immunologic: Negative.   Neurological: Negative.   Hematological: Negative.   Psychiatric/Behavioral: Negative.    All other systems reviewed and are negative.    Objective:   Vitals:   09/09/23 0844  BP: 118/68  Pulse: (!) 55  Resp: 16  SpO2: 97%  Weight: 175 lb (79.4 kg)  Height: 5' 4 (1.626 m)    Body mass index is 30.04 kg/m.  Physical Exam Vitals and nursing note reviewed.  Constitutional:      General: She is not in acute distress.    Appearance: Normal appearance. She is well-developed. She is not toxic-appearing or diaphoretic.  HENT:     Head: Normocephalic and atraumatic.     Right Ear: External ear normal.     Left Ear: External ear normal.     Nose: Nose normal.  Eyes:     General: No scleral icterus.       Right eye: No discharge.        Left eye: No discharge.     Conjunctiva/sclera: Conjunctivae normal.  Neck:     Trachea: No tracheal deviation.  Cardiovascular:     Rate and Rhythm: Bradycardia present. Rhythm irregular.  Pulmonary:     Effort: Pulmonary effort is normal. No respiratory distress.     Breath sounds: Normal breath sounds. No stridor. No wheezing, rhonchi or rales.  Musculoskeletal:     Right lower leg: Edema present.     Left lower leg: Edema present.  Skin:    General: Skin is warm and dry.     Findings: No rash.  Neurological:     Mental Status: She is alert.     Motor: No abnormal muscle tone.     Coordination: Coordination normal.     Gait: Gait normal.  Psychiatric:        Mood and Affect: Mood normal.        Behavior: Behavior normal. Behavior is cooperative.           Assessment & Plan:    Here for f/up after recent stage 4 pancreatic cancer diagnosis, she has been in and out of the hospital, had GI procedures and biopsy, is going to do chemo, multiple med  changes with specialists - she comes in today primarily asking for diuretic to help with B/L LE edema and to get home health aid to help her when she starts chemo Assessment & Plan Pancreatic cancer, unresectable, with planned chemotherapy Unresectable pancreatic cancer with planned chemotherapy. Emphasized aggressive nature and importance of balancing treatment with personal goals. Discussed potential for chemotherapy to shrink tumor. - port placement on Monday. - Attend chemotherapy class on Tuesday. - Begin chemotherapy on August 26. - Discuss goals of care and personal values with healthcare team.  Peripheral edema, left greater than right Peripheral edema, left greater than right, likely due to low sodium and recent medication changes. Blood pressure is low, making diuretics unsafe. - Recommend use of compression socks to manage edema. - they will buy OTC, explained we can measure and order Rx compression stockings if wanted - Avoid diuretics due to low blood pressure and electrolyte abnormalities  Hyponatremia Hyponatremia with low sodium levels. Sodium supplementation in place from inpt/oncology team - Continue sodium supplementation as prescribed. - f/up with managing specialists  Atrial fibrillation Bradycardic today on exam, per cardiology on metoprolol  25 mg BID and anticoagulated  Anorexia and unintentional weight loss Anorexia and unintentional weight loss. Importance of maintaining nutrition emphasized, especially with upcoming chemotherapy. - Emphasize importance of maintaining nutrition.  Gastroesophageal reflux disease GERD managed with pantoprazole . Discussed potential changes in medication needs with upcoming chemotherapy. - Refill pantoprazole  prescription. - Monitor for changes in symptoms with chemotherapy.  Desired HH aid - will call [facility] company that is currently ordered to  do PT/OT and see if they can do HH aid eval/assessment - pt strongly urged to keep the [facility]  PT/OT even though she doesn't feel like she needs it right now  Recording duration: 40 minutes   Follow up with primary care as needed - but reviewed how majority of her health care in the near future will be with oncology team  Michelene Cower, PA-C 09/09/23 8:59 AM

## 2023-09-09 NOTE — H&P (Signed)
 Chief Complaint: Patient was seen in consultation today for pancreatic adenocarcinoma with planned chemotherapy treatment, and with consideration for Port-A-Cath placement.  Referring Provider(s): Dr. Evalene Reusing, MD   Supervising Physician: Karalee Beat  Patient Status: [facility] - Out-pt  Patient is Full Code  History of Present Illness: Colleen [name] is a 78 y.o. female  with PMHx notable for pancreatic adenocarcinoma, HTN, HLD, COPD, asthma, prediabetes, GERD, OA, gout, and PE.  Per Dr. Jerone progress note on 8/12: Stage IV pancreatic adenocarcinoma: EUS at [facility] confirmed diagnosis. PET scan results from August 22, 2023 reviewed independently with 3 hypermetabolic lesions in patient's liver consistent with metastatic disease. Patient's most recent CA 19-9 is 5172. After lengthy discussion with the patient, she wishes to pursue palliative chemotherapy using weekly gemcitabine and Abraxane on days 1, 8, and 15 with a 22. This will be a 28-day cycle. Patient require port placement prior to initiating treatment. Return to clinic in 2 weeks for further evaluation and consideration of cycle 1, day 1.  Interventional Radiology was requested for Port-A-Cath placement. Patient is scheduled for same in IR today.   Patient is alert and laying in bed, calm.  Patient is currently without any significant complaints.  Patient denies any fevers, headache, chest pain, SOB, cough, abdominal pain, nausea, vomiting or bleeding.     Past Medical History:  Diagnosis Date   Asthma    Cataract    COPD (chronic obstructive pulmonary disease) (HCC)    GERD (gastroesophageal reflux disease)    Gout    History of pulmonary embolus (PE)    Hyperlipidemia    Hypertension    Osteoarthrosis    Prediabetes     Past Surgical History:  Procedure Laterality Date   BACK SURGERY     BREAST BIOPSY Left 04/08/2014   intraductal papilloma 5 mm   COLONOSCOPY WITH PROPOFOL  N/A 04/23/2020    Procedure: COLONOSCOPY WITH PROPOFOL ;  Surgeon: Janalyn Keene NOVAK, MD;  Location: [facility] ENDOSCOPY;  Service: Endoscopy;  Laterality: N/A;   ERCP N/A 08/05/2023   Procedure: ERCP, WITH INTERVENTION IF INDICATED;  Surgeon: Jinny Carmine, MD;  Location: [facility] ENDOSCOPY;  Service: Endoscopy;  Laterality: N/A;   ESOPHAGOGASTRODUODENOSCOPY (EGD) WITH PROPOFOL  N/A 04/23/2020   Procedure: ESOPHAGOGASTRODUODENOSCOPY (EGD) WITH PROPOFOL ;  Surgeon: Janalyn Keene NOVAK, MD;  Location: [facility] ENDOSCOPY;  Service: Endoscopy;  Laterality: N/A;   GIVENS CAPSULE STUDY N/A 01/28/2022   Procedure: GIVENS CAPSULE STUDY;  Surgeon: Therisa Bi, MD;  Location: [facility] ENDOSCOPY;  Service: Gastroenterology;  Laterality: N/A;   IR INT EXT BILIARY DRAIN WITH CHOLANGIOGRAM  08/08/2023   LUMBAR LAMINECTOMY     TONSILLECTOMY      Allergies: Nsaids  Medications: Prior to Admission medications   Medication Sig Start Date End Date Taking? Authorizing Provider  acetaminophen  (TYLENOL ) 650 MG CR tablet Take 650 mg by mouth every 8 (eight) hours as needed for pain.    [provider]  albuterol  (VENTOLIN  HFA) 108 (90 Base) MCG/ACT inhaler Inhale 2 puffs into the lungs every 4 (four) hours as needed for wheezing or shortness of breath. 03/31/23   Tapia, Leisa, PA-C  allopurinol  (ZYLOPRIM ) 100 MG tablet Take 2 tablets (200 mg total) by mouth daily. 08/01/23   Tapia, Leisa, PA-C  apixaban  (ELIQUIS ) 2.5 MG TABS tablet Take 2.5 mg by mouth 2 (two) times daily.    [provider]  Blood Pressure Monitoring (ADULT BLOOD PRESSURE CUFF LG) KIT 1 each by Does not apply route daily. 09/01/21  Bernardo Fend, DO  BREZTRI  AEROSPHERE 160-9-4.8 MCG/ACT AERO inhaler INHALE 2 PUFFS TWICE DAILY 08/19/23   Tapia, Leisa, PA-C  cholecalciferol (VITAMIN D3) 25 MCG (1000 UNIT) tablet Take 1,000 Units by mouth daily.    [provider]  enalapril  (VASOTEC ) 2.5 MG tablet Take 1 tablet (2.5 mg total) by mouth daily. Patient not  taking: Reported on 09/09/2023 10/05/22   Tapia, Leisa, PA-C  lidocaine -prilocaine  (EMLA ) cream Apply to affected area once 09/07/23   Jacobo Evalene PARAS, MD  megestrol  (MEGACE ) 20 MG tablet Take 1 tablet (20 mg total) by mouth 2 (two) times daily. 09/01/23   Alexander, Natalie, DO  Multiple Vitamin (MULTI-VITAMIN) tablet Take 1 tablet by mouth.  Take 1 tablet by mouth in the morning. 50 plus.    [provider]  Multiple Vitamins-Minerals (MULTIVITAMIN WOMEN 50+ PO) Take 1 tablet by mouth daily.    [provider]  ondansetron  (ZOFRAN ) 8 MG tablet Take 1 tablet (8 mg total) by mouth every 8 (eight) hours as needed for nausea or vomiting. 09/07/23   Jacobo Evalene PARAS, MD  ondansetron  (ZOFRAN -ODT) 4 MG disintegrating tablet Take 1 tablet (4 mg total) by mouth every 8 (eight) hours as needed for nausea or vomiting. 08/09/23   Dorinda Drue DASEN, MD  pantoprazole  (PROTONIX ) 40 MG tablet Take 1 tablet (40 mg total) by mouth daily. 08/01/23   Tapia, Leisa, PA-C  prochlorperazine  (COMPAZINE ) 10 MG tablet Take 1 tablet (10 mg total) by mouth every 6 (six) hours as needed for nausea or vomiting. 09/07/23   Jacobo Evalene PARAS, MD  simethicone  (GAS-X) 80 MG chewable tablet Chew 1 tablet (80 mg total) by mouth 2 (two) times a day. 08/02/18 06/17/28  Dorothyann Drivers, MD  sodium chloride  1 g tablet Take 2 tablets (2 g total) by mouth 2 (two) times daily with a meal. 09/01/23 10/01/23  Marsa Edelman, DO  Sodium Chloride  Flush (NORMAL SALINE FLUSH) 0.9 % SOLN Inject 10 mLs into the vein daily. 08/09/23 10/08/23  Malissie Musgrave A, PA-C  triamcinolone  cream (KENALOG ) 0.1 % APPLY TO AFFECTED AREA TWICE DAILY IF NEEDED. NO MORE THAN 1 WEEK AT A TIME, THEN TAKE A BREAK FOR 2 WEEKS. 02/10/22   Bernardo Fend, DO     Family History  Problem Relation Age of Onset   Diabetes Mother    Lung cancer Father    Diabetes Sister    Congestive Heart Failure Sister    Glaucoma Sister    Breast cancer Maternal Aunt      Social History   Socioeconomic History   Marital status: Widowed    Spouse name: Susann   Number of children: 0   Years of education: Not on file   Highest education level: 12th grade  Occupational History   Occupation: Retired  Tobacco Use   Smoking status: Former    Current packs/day: 0.00    Average packs/day: 1.5 packs/day for 20.0 years (30.0 ttl pk-yrs)    Types: Cigarettes    Start date: 11/13/1988    Quit date: 11/13/2008    Years since quitting: 14.8   Smokeless tobacco: Never   Tobacco comments:    smoking cessation materials not required  Vaping Use   Vaping status: Never Used  Substance and Sexual Activity   Alcohol use: Not Currently   Drug use: No   Sexual activity: Not Currently  Other Topics Concern   Not on file  Social History Narrative   Pt lives alone   Social Drivers  of Health   Financial Resource Strain: Low Risk  (12/02/2022)   Overall Financial Resource Strain (CARDIA)    Difficulty of Paying Living Expenses: Not very hard  Food Insecurity: Food Insecurity Present (08/30/2023)   Hunger Vital Sign    Worried About Running Out of Food in the Last Year: Never true    Ran Out of Food in the Last Year: Sometimes true  Transportation Needs: Unmet Transportation Needs (08/30/2023)   PRAPARE - Administrator, Civil Service (Medical): Yes    Lack of Transportation (Non-Medical): No  Physical Activity: Insufficiently Active (12/02/2022)   Exercise Vital Sign    Days of Exercise per Week: 2 days    Minutes of Exercise per Session: 60 min  Stress: No Stress Concern Present (12/02/2022)   [employer] of Occupational Health - Occupational Stress Questionnaire    Feeling of Stress : Not at all  Social Connections: Socially Isolated (08/30/2023)   Social Connection and Isolation Panel    Frequency of Communication with Friends and Family: More than three times a week    Frequency of Social Gatherings with Friends and Family: More than  three times a week    Attends Religious Services: Never    Database administrator or Organizations: No    Attends Banker Meetings: Never    Marital Status: Widowed     Review of Systems: A 12 point ROS discussed and pertinent positives are indicated in the HPI above.  All other systems are negative.  Vital Signs: BP 115/67   Pulse 88   Temp 97.8 F (36.6 C) (Oral)   Resp 14   Ht 5' 4 (1.626 m)   Wt 175 lb (79.4 kg)   SpO2 100%   BMI 30.04 kg/m   Advance Care Plan: The advanced care place/surrogate decision maker was discussed at the time of visit and the patient did not wish to discuss or was not able to name a surrogate decision maker or provide an advance care plan.  Physical Exam Constitutional:      General: She is not in acute distress.    Appearance: Normal appearance.  HENT:     Mouth/Throat:     Mouth: Mucous membranes are dry.  Cardiovascular:     Rate and Rhythm: Normal rate and regular rhythm.     Heart sounds: No murmur heard. Pulmonary:     Effort: Pulmonary effort is normal.     Breath sounds: Normal breath sounds. No wheezing.  Musculoskeletal:        General: Normal range of motion.     Cervical back: Normal range of motion.  Skin:    General: Skin is warm and dry.  Neurological:     Mental Status: She is alert and oriented to person, place, and time.  Psychiatric:        Mood and Affect: Mood normal.        Behavior: Behavior normal.        Thought Content: Thought content normal.        Judgment: Judgment normal.     Imaging: NM PET Image Initial (PI) Skull Base To Thigh Result Date: 08/23/2023 CLINICAL DATA:  Initial treatment strategy for pancreatic mass. EXAM: NUCLEAR MEDICINE PET SKULL BASE TO THIGH TECHNIQUE: 9.14 mCi F-18 FDG was injected intravenously. Full-ring PET imaging was performed from the skull base to thigh after the radiotracer. CT data was obtained and used for attenuation correction and anatomic localization.  Fasting blood  glucose: 83 mg/dl COMPARISON:  MRI 92/89/7974.  Ultrasound 08/04/2023. FINDINGS: Mediastinal blood pool activity: SUV max 3.2 Liver activity: SUV max 3.6 NECK: There is a small area of uptake identified along the thoracic along the right side maximum SUV 7.1. There is a very small lymph node in this location on image 29 measuring 3 mm in short axis. This could be an atypical pathologic node. No additional areas of abnormal uptake elsewhere along lymph node chains in the neck. There is some misregistration between PET and CT scan with some motion. Grossly the visualized intracranial compartment has near symmetric uptake. Incidental CT findings: Area paranasal sinuses and mastoid air cells are clear. The parotid glands, submandibular glands and thyroid  gland is preserved. Mild vascular calcifications are seen. CHEST: No specific abnormal uptake above blood pool in the axillary regions, hilum or mediastinum. No areas of abnormal lung uptake. Incidental CT findings: Heart is nonenlarged. No pericardial effusion. Normal caliber thoracic aorta with some scattered vascular calcifications. There is also some coronary artery calcifications as well as some mild calcifications along the great vessels. Slightly patulous thoracic esophagus. Mild linear opacity along the left lung base likely scar or atelectasis. No pleural effusion. ABDOMEN/PELVIS: There are 3 lesions seen in the liver. Two dome lesions on the right side are included. The anterior lesion has maximum SUV of 6.9 on image 68 in the posterior lesion maximum SUV of 6.3 on image 66. These are quite small, under a cm. There is a segment 4B lesion as well with maximum SUV of 6.7 on image 81. Subtle liver metastases are possible. Attention on follow-up. There is significant abnormal uptake corresponding to the pancreatic head mass. This has maximum SUV of 26.0 there on prior MRI measured 2.8 x 1.8 cm. Again pancreatic parenchymal atrophy and ductal  dilatation identified. No additional areas of abnormal uptake. Physiologic distribution radiotracer seen along the other parenchymal organs, bowel and renal collecting systems. No definite areas of abnormal nodal uptake. Incidental CT findings: Left-sided PTC catheter in place. Pigtail extends to the 2/3 portion of the duodenum. Air in the biliary tree. Distended gallbladder. Surgical changes in the left upper quadrant involving the proximal stomach and adjacent tissues. The stomach, small and large bowel are nondilated. Moderate colonic stool with significant stool in the rectum. Scattered vascular calcifications. No abnormal calcifications seen within either kidney nor along the course of either ureter. Lobular uterus with multiple calcified fibroids. Spleen, adrenal glands are preserved. SKELETON: No focal hypermetabolic activity to suggest skeletal metastasis. Incidental CT findings: Scattered degenerative changes identified. Multilevel stenosis. Sclerosis identified along the right femoral head. IMPRESSION: Known hypermetabolic pancreatic head mass as seen previously. There are 3 small areas of asymmetric increased uptake in the liver which could be small hepatic metastases. Attention on follow-up. Solitary area of uptake the thoracic inlet on the right side correspond a very small lymph node. Atypical lymph node metastasis is possible. Attention on short follow-up. PTC catheter in place with air in the biliary tree. Uterine fibroids. Colonic sigmoid diverticula. Significant colonic stool in the rectum. Electronically Signed   By: Ranell Bring M.D.   On: 08/23/2023 16:10    Labs:  CBC: Recent Labs    08/30/23 1145 08/31/23 0348 09/01/23 0341 09/06/23 1259  WBC 17.5* 13.4* 9.6 8.4  HGB 14.2 12.1 11.9* 12.2  HCT 41.0 35.5* 34.4* 36.0  PLT 344 279 245 275    COAGS: Recent Labs    08/04/23 2051 08/08/23 0559  INR 1.1 1.1  APTT  34  --     BMP: Recent Labs    08/30/23 1524 08/31/23 0348  09/01/23 0341 09/06/23 1300  NA 127* 129* 134* 134*  K 3.0* 3.7 3.4* 3.2*  CL 87* 97* 103 105  CO2 26 25 24 22   GLUCOSE 102* 92 112* 126*  BUN 28* 25* 19 15  CALCIUM  9.0 8.7* 8.8* 8.7*  CREATININE 1.06* 0.74 0.72 0.78  GFRNONAA 54* >60 >60 >60    LIVER FUNCTION TESTS: Recent Labs    08/30/23 1145 08/31/23 0348 09/01/23 0341 09/06/23 1300  BILITOT 2.0* 1.2 1.0 0.9  AST 40 25 25 24   ALT 23 19 18 17   ALKPHOS 211* 176* 139* 141*  PROT 7.3 6.3* 5.7* 6.2*  ALBUMIN 3.3* 2.9* 2.6* 2.8*    TUMOR MARKERS: No results for input(s): AFPTM, CEA, CA199, CHROMGRNA in the last 8760 hours.  Assessment and Plan: Per Dr. Jerone progress note on 8/12: Stage IV pancreatic adenocarcinoma: EUS at [facility] confirmed diagnosis. PET scan results from August 22, 2023 reviewed independently with 3 hypermetabolic lesions in patient's liver consistent with metastatic disease. Patient's most recent CA 19-9 is 5172. After lengthy discussion with the patient, she wishes to pursue palliative chemotherapy using weekly gemcitabine and Abraxane on days 1, 8, and 15 with a 22. This will be a 28-day cycle. Patient require port placement prior to initiating treatment. Return to clinic in 2 weeks for further evaluation and consideration of cycle 1, day 1.  Patient presents for scheduled Port-A-Cath placement in IR today.  Patient has been NPO since midnight.  All labs and medications are within acceptable parameters.  No pertinent allergies.   Risks and benefits of image guided port-a-catheter placement was discussed with the patient including, but not limited to bleeding, infection, pneumothorax, or fibrin sheath development and need for additional procedures.  All of the patient's questions were answered, patient is agreeable to proceed. Consent signed and in chart.    Thank you for allowing our service to participate in Colleen [name] 's care.  Electronically Signed: Carlin DELENA Griffon, PA-C    09/12/2023, 10:48 AM      I spent a total of 30 Minutes in face to face in clinical consultation, greater than 50% of which was counseling/coordinating care for pancreatic adenocarcinoma with planned chemotherapy treatment, and with consideration for Port-A-Cath placement.

## 2023-09-12 ENCOUNTER — Other Ambulatory Visit: Payer: Self-pay

## 2023-09-12 ENCOUNTER — Ambulatory Visit
Admission: RE | Admit: 2023-09-12 | Discharge: 2023-09-12 | Disposition: A | Discharge status: Home / Self Care | Source: Ambulatory Visit | Attending: Oncology | Admitting: Oncology

## 2023-09-12 ENCOUNTER — Telehealth: Payer: Self-pay

## 2023-09-12 ENCOUNTER — Encounter: Payer: Self-pay | Admitting: Radiology

## 2023-09-12 DIAGNOSIS — R000 Tachycardia, unspecified: Secondary | ICD-10-CM | POA: Diagnosis not present

## 2023-09-12 DIAGNOSIS — M109 Gout, unspecified: Secondary | ICD-10-CM | POA: Insufficient documentation

## 2023-09-12 DIAGNOSIS — E785 Hyperlipidemia, unspecified: Secondary | ICD-10-CM | POA: Diagnosis not present

## 2023-09-12 DIAGNOSIS — I480 Paroxysmal atrial fibrillation: Secondary | ICD-10-CM | POA: Diagnosis not present

## 2023-09-12 DIAGNOSIS — K219 Gastro-esophageal reflux disease without esophagitis: Secondary | ICD-10-CM | POA: Insufficient documentation

## 2023-09-12 DIAGNOSIS — Z79899 Other long term (current) drug therapy: Secondary | ICD-10-CM | POA: Insufficient documentation

## 2023-09-12 DIAGNOSIS — J4489 Other specified chronic obstructive pulmonary disease: Secondary | ICD-10-CM | POA: Insufficient documentation

## 2023-09-12 DIAGNOSIS — C259 Malignant neoplasm of pancreas, unspecified: Secondary | ICD-10-CM | POA: Diagnosis not present

## 2023-09-12 DIAGNOSIS — Z7901 Long term (current) use of anticoagulants: Secondary | ICD-10-CM | POA: Insufficient documentation

## 2023-09-12 DIAGNOSIS — R7303 Prediabetes: Secondary | ICD-10-CM | POA: Insufficient documentation

## 2023-09-12 DIAGNOSIS — I10 Essential (primary) hypertension: Secondary | ICD-10-CM | POA: Insufficient documentation

## 2023-09-12 DIAGNOSIS — Z87891 Personal history of nicotine dependence: Secondary | ICD-10-CM | POA: Insufficient documentation

## 2023-09-12 HISTORY — PX: IR IMAGING GUIDED PORT INSERTION: IMG5740

## 2023-09-12 LAB — BASIC METABOLIC PANEL WITH GFR
Anion gap: 8 (ref 5–15)
BUN: 13 mg/dL (ref 8–23)
CO2: 22 mmol/L (ref 22–32)
Calcium: 8.7 mg/dL — ABNORMAL LOW (ref 8.9–10.3)
Chloride: 108 mmol/L (ref 98–111)
Creatinine, Ser: 0.67 mg/dL (ref 0.44–1.00)
GFR, Estimated: >60 mL/min (ref >60)
Glucose, Bld: 101 mg/dL — ABNORMAL HIGH (ref 70–99)
Potassium: 2.9 mmol/L — ABNORMAL LOW (ref 3.5–5.1)
Sodium: 138 mmol/L (ref 135–145)

## 2023-09-12 LAB — CBC WITH DIFFERENTIAL/PLATELET
Abs Immature Granulocytes: 0.03 K/uL (ref 0.00–0.07)
Basophils Absolute: 0.1 K/uL (ref 0.0–0.1)
Basophils Relative: 1 %
Eosinophils Absolute: 0 K/uL (ref 0.0–0.5)
Eosinophils Relative: 1 %
HCT: 31.7 % — ABNORMAL LOW (ref 36.0–46.0)
Hemoglobin: 10.6 g/dL — ABNORMAL LOW (ref 12.0–15.0)
Immature Granulocytes: 0 %
Lymphocytes Relative: 26 %
Lymphs Abs: 2 K/uL (ref 0.7–4.0)
MCH: 30.5 pg (ref 26.0–34.0)
MCHC: 33.4 g/dL (ref 30.0–36.0)
MCV: 91.4 fL (ref 80.0–100.0)
Monocytes Absolute: 0.6 K/uL (ref 0.1–1.0)
Monocytes Relative: 7 %
Neutro Abs: 4.9 K/uL (ref 1.7–7.7)
Neutrophils Relative %: 65 %
Platelets: 279 K/uL (ref 150–400)
RBC: 3.47 MIL/uL — ABNORMAL LOW (ref 3.87–5.11)
RDW: 14 % (ref 11.5–15.5)
WBC: 7.6 K/uL (ref 4.0–10.5)
nRBC: 0 % (ref 0.0–0.2)

## 2023-09-12 LAB — MAGNESIUM: Magnesium: 1.7 mg/dL (ref 1.7–2.4)

## 2023-09-12 MED ORDER — LIDOCAINE-EPINEPHRINE 1 %-1:100000 IJ SOLN
20.0000 mL | Freq: Once | INTRAMUSCULAR | Status: AC
  Administered 2023-09-12: 20 mL via INTRADERMAL

## 2023-09-12 MED ORDER — MIDAZOLAM HCL 2 MG/2ML IJ SOLN
INTRAMUSCULAR | Status: AC
  Filled 2023-09-12: qty 2

## 2023-09-12 MED ORDER — HEPARIN SOD (PORK) LOCK FLUSH 100 UNIT/ML IV SOLN
500.0000 [IU] | Freq: Once | INTRAVENOUS | Status: AC
  Administered 2023-09-12: 500 [IU] via INTRAVENOUS

## 2023-09-12 MED ORDER — APIXABAN 2.5 MG PO TABS: 5.0000 mg | ORAL_TABLET | Freq: Two times a day (BID) | ORAL | Status: DC

## 2023-09-12 MED ORDER — METOPROLOL TARTRATE 5 MG/5ML IV SOLN
5.0000 mg | INTRAVENOUS | Status: AC | PRN
  Administered 2023-09-12 (×2): 5 mg via INTRAVENOUS

## 2023-09-12 MED ORDER — POTASSIUM CHLORIDE CRYS ER 20 MEQ PO TBCR: 20.0000 meq | EXTENDED_RELEASE_TABLET | Freq: Two times a day (BID) | ORAL | 0 refills | Status: DC

## 2023-09-12 MED ORDER — METOPROLOL TARTRATE 5 MG/5ML IV SOLN
INTRAVENOUS | Status: AC
  Filled 2023-09-12: qty 10

## 2023-09-12 MED ORDER — METOPROLOL TARTRATE 25 MG PO TABS: 25.0000 mg | ORAL_TABLET | Freq: Two times a day (BID) | ORAL | 6 refills | Status: DC

## 2023-09-12 MED ORDER — SODIUM CHLORIDE 0.9 % IV SOLN: INTRAVENOUS | Status: DC

## 2023-09-12 MED ORDER — HEPARIN SOD (PORK) LOCK FLUSH 100 UNIT/ML IV SOLN
INTRAVENOUS | Status: AC
  Filled 2023-09-12: qty 5

## 2023-09-12 MED ORDER — FENTANYL CITRATE (PF) 100 MCG/2ML IJ SOLN
INTRAMUSCULAR | Status: AC | PRN
  Administered 2023-09-12 (×2): 50 ug via INTRAVENOUS

## 2023-09-12 MED ORDER — MIDAZOLAM HCL 5 MG/5ML IJ SOLN
INTRAMUSCULAR | Status: AC | PRN
Start: 2023-09-12 — End: 2023-09-12
  Administered 2023-09-12: 1 mg via INTRAVENOUS

## 2023-09-12 MED ORDER — MIDAZOLAM HCL 2 MG/2ML IJ SOLN
INTRAMUSCULAR | Status: AC | PRN
  Administered 2023-09-12: 1 mg via INTRAVENOUS

## 2023-09-12 MED ORDER — METOPROLOL TARTRATE 5 MG/5ML IV SOLN
INTRAVENOUS | Status: AC
  Administered 2023-09-12: 5 mg via INTRAVENOUS
  Filled 2023-09-12: qty 5

## 2023-09-12 MED ORDER — LIDOCAINE-EPINEPHRINE 1 %-1:100000 IJ SOLN
INTRAMUSCULAR | Status: AC
  Filled 2023-09-12: qty 1

## 2023-09-12 MED ORDER — FENTANYL CITRATE (PF) 100 MCG/2ML IJ SOLN
INTRAMUSCULAR | Status: AC
Start: 2023-09-12 — End: 2023-09-12
  Filled 2023-09-12: qty 2

## 2023-09-12 NOTE — Telephone Encounter (Unsigned)
 Copied from CRM #[id number]. Topic: Clinical - Home Health Verbal Orders >> Sep 12, 2023  8:39 AM [place] MATSU wrote: Caller/Agency:  [place] from [facility] Callback Number: [phone] Service Requested: Physical Therapy Frequency: discharged from home health services patient states she does not need it; patient is requesting an aid.  Any new concerns about the patient? No

## 2023-09-12 NOTE — Progress Notes (Signed)
 Patient clinically stable post IR Port placement per Dr McCullough,tolerated well. Vitals stable pre and post procedure. Received Versed  2 mg along with Fentanyl  100 mcg IV for procedure. Report given to Jann Parker Rn post procedure/specials/23.

## 2023-09-12 NOTE — Progress Notes (Signed)
 Patient has hx atrial fibrillation. Patient came back from procedure with higher irregular rhythm that pre-procedure. PA assessed and spoke with MD. Orders to give metoprolol  5 mg q 5 minutes up to 15 minutes.

## 2023-09-12 NOTE — Consult Note (Signed)
 Cardiology Consultation   Patient ID: Colleen [name] MRN: [id number]; DOB: May 21, 1945  Admit date: 09/12/2023 Date of Consult: 09/12/2023  PCP:  Colleen Mole, Colleen [name]   [facility] Providers Cardiologist:  Redell Cave, MD        Patient Profile: Colleen [name] is a 78 y.o. female with a hx of paroxysmal atrial fibrillation pancreatic cancer who is being seen 09/12/2023 for the evaluation of tachycardia at the request of Dr. Karalee.  History of Present Illness: Colleen [name] is a 78 year old female with known history of paroxysmal atrial fibrillation, pancreatic cancer, essential hypertension, hyperlipidemia, COPD, asthma and prediabetes. She is being followed by oncology for stage IV pancreatic adenocarcinoma and was referred for an outpatient Port-A-Cath placement.  She had the procedure done today and was noted to have irregular rhythm with tachycardia after.  She was given IV metoprolol  5 mg x 3 with improvement in her heart rate.  The patient denies chest pain or shortness of breath.  She has minimal palpitations. She was recently taken off antihypertensive medication due to relatively low blood pressure.  She takes Eliquis  5 mg twice daily for known history of paroxysmal atrial fibrillation.  She was recently hospitalized for decreased oral intake and volume depletion.  She was hypotensive and tachycardic.  She improved with IV fluids and blood pressure medications were stopped including hydrochlorothiazide  and enalapril .  Past Medical History:  Diagnosis Date   Asthma    Cataract    COPD (chronic obstructive pulmonary disease) (HCC)    GERD (gastroesophageal reflux disease)    Gout    History of pulmonary embolus (PE)    Hyperlipidemia    Hypertension    Osteoarthrosis    Prediabetes     Past Surgical History:  Procedure Laterality Date   BACK SURGERY     BREAST BIOPSY Left 04/08/2014   intraductal papilloma 5 mm   COLONOSCOPY WITH PROPOFOL  N/A 04/23/2020    Procedure: COLONOSCOPY WITH PROPOFOL ;  Surgeon: Janalyn Keene NOVAK, MD;  Location: [facility] ENDOSCOPY;  Service: Endoscopy;  Laterality: N/A;   ERCP N/A 08/05/2023   Procedure: ERCP, WITH INTERVENTION IF INDICATED;  Surgeon: Jinny Carmine, MD;  Location: [facility] ENDOSCOPY;  Service: Endoscopy;  Laterality: N/A;   ESOPHAGOGASTRODUODENOSCOPY (EGD) WITH PROPOFOL  N/A 04/23/2020   Procedure: ESOPHAGOGASTRODUODENOSCOPY (EGD) WITH PROPOFOL ;  Surgeon: Janalyn Keene NOVAK, MD;  Location: [facility] ENDOSCOPY;  Service: Endoscopy;  Laterality: N/A;   GIVENS CAPSULE STUDY N/A 01/28/2022   Procedure: GIVENS CAPSULE STUDY;  Surgeon: Therisa Bi, MD;  Location[facility] ENDOSCOPY;  Service: Gastroenterology;  Laterality: N/A;   IR IMAGING GUIDED PORT INSERTION  09/12/2023   IR INT EXT BILIARY DRAIN WITH CHOLANGIOGRAM  08/08/2023   LUMBAR LAMINECTOMY     TONSILLECTOMY       Home Medications:  Prior to Admission medications   Medication Sig Start Date End Date Taking? Authorizing Provider  albuterol  (VENTOLIN  HFA) 108 (90 Base) MCG/ACT inhaler Inhale 2 puffs into the lungs every 4 (four) hours as needed for wheezing or shortness of breath. 03/31/23  Yes Tapia, Leisa, Colleen [name]  allopurinol  (ZYLOPRIM ) 100 MG tablet Take 2 tablets (200 mg total) by mouth daily. 08/01/23  Yes Tapia, Leisa, Colleen [name]  BREZTRI  AEROSPHERE 160-9-4.8 MCG/ACT AERO inhaler INHALE 2 PUFFS TWICE DAILY 08/19/23  Yes Tapia, Leisa, Colleen [name]  cholecalciferol (VITAMIN D3) 25 MCG (1000 UNIT) tablet Take 1,000 Units by mouth daily.   Yes [provider]  megestrol  (MEGACE ) 20 MG tablet Take 1 tablet (20 mg total) by mouth  2 (two) times daily. 09/01/23  Yes Alexander, Natalie, DO  metoprolol  tartrate (LOPRESSOR ) 25 MG tablet Take 1 tablet (25 mg total) by mouth 2 (two) times daily. 09/12/23 09/11/24 Yes Darron Colleen LABOR, MD  Multiple Vitamin (MULTI-VITAMIN) tablet Take 1 tablet by mouth.  Take 1 tablet by mouth in the morning. 50 plus.   Yes [provider]   pantoprazole  (PROTONIX ) 40 MG tablet Take 1 tablet (40 mg total) by mouth daily. 09/09/23  Yes Tapia, Leisa, Colleen [name]  potassium chloride  SA (KLOR-CON  M) 20 MEQ tablet Take 1 tablet (20 mEq total) by mouth 2 (two) times daily. 09/12/23  Yes Darron Colleen LABOR, MD  simethicone  (GAS-X) 80 MG chewable tablet Chew 1 tablet (80 mg total) by mouth 2 (two) times a day. 08/02/18 06/17/28 Yes Dorothyann Drivers, MD  sodium chloride  1 g tablet Take 2 tablets (2 g total) by mouth 2 (two) times daily with a meal. 09/01/23 10/01/23 Yes Marsa Edelman, DO  acetaminophen  (TYLENOL ) 650 MG CR tablet Take 650 mg by mouth every 8 (eight) hours as needed for pain.    [provider]  apixaban  (ELIQUIS ) 2.5 MG TABS tablet Take 2 tablets (5 mg total) by mouth 2 (two) times daily. 09/12/23   Darron Colleen LABOR, MD  Blood Pressure Monitoring (ADULT BLOOD PRESSURE CUFF LG) KIT 1 each by Does not apply route daily. 09/01/21   Bernardo Fend, DO  lidocaine -prilocaine  (EMLA ) cream Apply to affected area once 09/07/23   Finnegan, Timothy J, MD  Multiple Vitamins-Minerals (MULTIVITAMIN WOMEN 50+ PO) Take 1 tablet by mouth daily.    [provider]  ondansetron  (ZOFRAN ) 8 MG tablet Take 1 tablet (8 mg total) by mouth every 8 (eight) hours as needed for nausea or vomiting. 09/07/23   Jacobo Evalene PARAS, MD  ondansetron  (ZOFRAN -ODT) 4 MG disintegrating tablet Take 1 tablet (4 mg total) by mouth every 8 (eight) hours as needed for nausea or vomiting. 08/09/23   Dorinda Drue DASEN, MD  prochlorperazine  (COMPAZINE ) 10 MG tablet Take 1 tablet (10 mg total) by mouth every 6 (six) hours as needed for nausea or vomiting. 09/07/23   Jacobo Evalene PARAS, MD  Sodium Chloride  Flush (NORMAL SALINE FLUSH) 0.9 % SOLN Inject 10 mLs into the vein daily. 08/09/23 10/08/23  Carim, Charles A, Colleen [name]  triamcinolone  cream (KENALOG ) 0.1 % APPLY TO AFFECTED AREA TWICE DAILY IF NEEDED. NO MORE THAN 1 WEEK AT A TIME, THEN TAKE A BREAK FOR 2 WEEKS. 02/10/22    Bernardo Fend, DO    Scheduled Meds:  metoprolol  tartrate       Continuous Infusions:  sodium chloride  10 mL/hr at 09/12/23 1050   PRN Meds: metoprolol  tartrate  Allergies:    Allergies  Allergen Reactions   Nsaids     Other Reaction(s): Not available    Social History:   Social History   Socioeconomic History   Marital status: Widowed    Spouse name: Colleen [name]   Number of children: 0   Years of education: Not on file   Highest education level: 12th grade  Occupational History   Occupation: Retired  Tobacco Use   Smoking status: Former    Current packs/day: 0.00    Average packs/day: 1.5 packs/day for 20.0 years (30.0 ttl pk-yrs)    Types: Cigarettes    Start date: 11/13/1988    Quit date: 11/13/2008    Years since quitting: 14.8   Smokeless tobacco: Never   Tobacco comments:    smoking cessation materials not  required  Vaping Use   Vaping status: Never Used  Substance and Sexual Activity   Alcohol use: Not Currently   Drug use: No   Sexual activity: Not Currently  Other Topics Concern   Not on file  Social History Narrative   Pt lives alone   Social Drivers of Health   Financial Resource Strain: Low Risk  (12/02/2022)   Overall Financial Resource Strain (CARDIA)    Difficulty of Paying Living Expenses: Not very hard  Food Insecurity: Food Insecurity Present (08/30/2023)   Hunger Vital Sign    Worried About Running Out of Food in the Last Year: Never true    Ran Out of Food in the Last Year: Sometimes true  Transportation Needs: Unmet Transportation Needs (08/30/2023)   PRAPARE - Administrator, Civil Service (Medical): Yes    Lack of Transportation (Non-Medical): No  Physical Activity: Insufficiently Active (12/02/2022)   Exercise Vital Sign    Days of Exercise per Week: 2 days    Minutes of Exercise per Session: 60 min  Stress: No Stress Concern Present (12/02/2022)   [employer] of Occupational Health - Occupational Stress  Questionnaire    Feeling of Stress : Not at all  Social Connections: Socially Isolated (08/30/2023)   Social Connection and Isolation Panel    Frequency of Communication with Friends and Family: More than three times a week    Frequency of Social Gatherings with Friends and Family: More than three times a week    Attends Religious Services: Never    Database administrator or Organizations: No    Attends Banker Meetings: Never    Marital Status: Widowed  Intimate Partner Violence: Unknown (08/30/2023)   Humiliation, Afraid, Rape, and Kick questionnaire    Fear of Current or Ex-Partner: No    Emotionally Abused: No    Physically Abused: Not on file    Sexually Abused: No    Family History:    Family History  Problem Relation Age of Onset   Diabetes Mother    Lung cancer Father    Diabetes Sister    Congestive Heart Failure Sister    Glaucoma Sister    Breast cancer Maternal Aunt      ROS:  Please see the history of present illness.   All other ROS reviewed and negative.     Physical Exam/Data: Vitals:   09/12/23 1445 09/12/23 1500 09/12/23 1515 09/12/23 1530  BP: (!) 97/48 111/61 112/75 120/66  Pulse: 83 77 82 78  Resp: (!) 22 11 18  (!) 0  Temp:    97.8 F (36.6 C)  TempSrc:    Temporal  SpO2: 100% 98% 97% 99%  Weight:      Height:       No intake or output data in the 24 hours ending 09/12/23 1542    09/12/2023   10:43 AM 09/09/2023    8:44 AM 09/06/2023    1:20 PM  Last 3 Weights  Weight (lbs) 175 lb 175 lb 174 lb  Weight (kg) 79.379 kg 79.379 kg 78.926 kg     Body mass index is 30.04 kg/m.  General:  Well nourished, well developed, in no acute distress HEENT: normal Neck: no JVD Vascular: No carotid bruits; Distal pulses 2+ bilaterally Cardiac:  normal S1, S2; regular rate with premature beats and no murmurs Lungs:  clear to auscultation bilaterally, no wheezing, rhonchi or rales  Abd: soft, nontender, no hepatomegaly  Ext: no  edema  Musculoskeletal:  No deformities, BUE and BLE strength normal and equal Skin: warm and dry  Neuro:  CNs 2-12 intact, no focal abnormalities noted Psych:  Normal affect   EKG:  The EKG was personally reviewed and demonstrates: Sinus rhythm with frequent PACs Telemetry:  Telemetry was personally reviewed and demonstrates: Sinus rhythm with frequent PACs  Relevant CV Studies:   Laboratory Data: High Sensitivity Troponin:   Recent Labs  Lab 08/30/23 1145 08/30/23 1341  TROPONINIHS 47* 48*     Chemistry Recent Labs  Lab 09/06/23 1259 09/06/23 1300 09/12/23 1428  NA  --  134* 138  K  --  3.2* 2.9*  CL  --  105 108  CO2  --  22 22  GLUCOSE  --  126* 101*  BUN  --  15 13  CREATININE  --  0.78 0.67  CALCIUM   --  8.7* 8.7*  MG 1.5*  --  1.7  GFRNONAA  --  >60 >60  ANIONGAP  --  7 8    Recent Labs  Lab 09/06/23 1300  PROT 6.2*  ALBUMIN 2.8*  AST 24  ALT 17  ALKPHOS 141*  BILITOT 0.9   Lipids No results for input(s): CHOL, TRIG, HDL, LABVLDL, LDLCALC, CHOLHDL in the last 168 hours.  Hematology Recent Labs  Lab 09/06/23 1259 09/12/23 1428  WBC 8.4 7.6  RBC 3.96 3.47*  HGB 12.2 10.6*  HCT 36.0 31.7*  MCV 90.9 91.4  MCH 30.8 30.5  MCHC 33.9 33.4  RDW 13.8 14.0  PLT 275 279   Thyroid  No results for input(s): TSH, FREET4 in the last 168 hours.  BNPNo results for input(s): BNP, PROBNP in the last 168 hours.  DDimer No results for input(s): DDIMER in the last 168 hours.  Radiology/Studies:  IR IMAGING GUIDED PORT INSERTION Result Date: 09/12/2023 INDICATION: 78 year old female with pancreatic cancer in need of durable venous access for chemotherapy. EXAM: IMPLANTED PORT A CATH PLACEMENT WITH ULTRASOUND AND FLUOROSCOPIC GUIDANCE MEDICATIONS: None. ANESTHESIA/SEDATION: Versed  2 mg IV; Fentanyl  100 mcg IV; administered intravenously by the Radiology nurse Moderate Sedation Time:  18 minutes The patient's vital signs and level of  consciousness were continuously monitored during the procedure by the interventional radiology nurse under my direct supervision. FLUOROSCOPY: Radiation exposure index: 0 mGy, reference air kerma COMPLICATIONS: None immediate. PROCEDURE: The right neck and chest was prepped with chlorhexidine, and draped in the usual sterile fashion using maximum barrier technique (cap and mask, sterile gown, sterile gloves, large sterile sheet, hand hygiene and cutaneous antiseptic). Local anesthesia was attained by infiltration with 1% lidocaine  with epinephrine . Ultrasound demonstrated patency of the right internal jugular vein, and this was documented with an image. Under real-time ultrasound guidance, this vein was accessed with a 21 gauge micropuncture needle and image documentation was performed. A small dermatotomy was made at the access site with an 11 scalpel. A 0.018 wire was advanced into the SVC and the access needle exchanged for a 61F micropuncture vascular sheath. The 0.018 wire was then removed and a 0.035 wire advanced into the IVC. An appropriate location for the subcutaneous reservoir was selected below the clavicle and an incision was made through the skin and underlying soft tissues. The subcutaneous tissues were then dissected using a combination of blunt and sharp surgical technique and a pocket was formed. A Bard Clear Vue single lumen power injectable portacatheter was then tunneled through the subcutaneous tissues from the pocket to the dermatotomy and the port reservoir placed within the  subcutaneous pocket. The venous access site was then serially dilated and a peel away vascular sheath placed over the wire. The wire was removed and the port catheter advanced into position under fluoroscopic guidance. The catheter tip is positioned in the superior cavoatrial junction. This was documented with a spot image. The portacatheter was then tested and found to flush and aspirate well. The port was flushed with  saline followed by 100 units/mL heparinized saline. The pocket was then closed in two layers using first subdermal inverted interrupted absorbable sutures followed by a running subcuticular suture. The epidermis was then sealed with Dermabond. The dermatotomy at the venous access site was also closed with Dermabond. IMPRESSION: Successful placement of a right IJ approach Bard Clear Vue port catheter with ultrasound and fluoroscopic guidance. The catheter is ready for use. Electronically Signed   By: Wilkie Lent M.D.   On: 09/12/2023 13:50     Assessment and Plan: Paroxysmal atrial fibrillation with intermittent tachycardia: I saw her after she was given 3 doses of IV metoprolol .  During my evaluation, she was noted to be in sinus rhythm with frequent PACs.  She has been taking Eliquis  5 mg twice daily.  She is not on any rate controlling medications.  I did order stat labs which revealed severe hypokalemia with a potassium of 2.9.  Recommend continuing anticoagulation with Eliquis .  The patient seems to be stable for discharge and I sent prescriptions for potassium chloride  20 mill equivalent twice daily and metoprolol  tartrate 25 mg twice daily.  I will arrange for follow-up labs and office visit in our office in 1 to 2 weeks. History of essential hypertension: I agree with stopping her antihypertensive medications and we will keep her on the newly prescribed metoprolol . Stage IV pancreatic cancer: Followed by oncology.    For questions or updates, please contact [facility] Please consult [email or web address] for contact info under    Signed, Colleen Cage, MD  09/12/2023 3:42 PM

## 2023-09-13 ENCOUNTER — Other Ambulatory Visit (HOSPITAL_COMMUNITY): Payer: Self-pay

## 2023-09-13 ENCOUNTER — Inpatient Hospital Stay: Admitting: Oncology

## 2023-09-13 ENCOUNTER — Inpatient Hospital Stay

## 2023-09-13 NOTE — Telephone Encounter (Signed)
 Called and lvm inquiring about PCA assessment and PTOT to [facility]

## 2023-09-13 NOTE — Progress Notes (Signed)
 [facility] CSW Progress Note  Clinical Social Work introduced self to patient during Patient Education with Raoul Moats, Charity fundraiser.  Provided information regarding CSW role, including counseling, advanced care planning and support group.  Answered questions as needed.  Follow Up Plan:  CSW will follow-up with patient by phone     Macario CHRISTELLA Au, LCSW Clinical Social Worker [facility]

## 2023-09-15 ENCOUNTER — Encounter: Payer: Self-pay | Admitting: Oncology

## 2023-09-15 NOTE — Progress Notes (Deleted)
 Cardiology Clinic Note   Date: 09/15/2023 ID: Laquanna, Veazey 1945/03/25, MRN [id number]  Primary Cardiologist:  Colleen Cave, MD  Chief Complaint   Colleen [name] is a 78 y.o. female who presents to the clinic today for ***  Patient Profile   Colleen [name] is followed by *** for the history outlined below.       Past medical history significant for: Palpitations/PAF.  Onset March 2021 in the setting of PE. 14-day ZIO 06/18/2019: HR 54 to 226 bpm, average 80 bpm. Predominantly sinus rhythm.  2818 runs of SVT fastest 5 beats max rate 226, longest 24.8 seconds average rate 151 bpm.  SVT was detected within +/- 45 seconds of symptomatic patient events.  Some episodes of SVT may be possible A. tach with variable block.  Occasional PACs (2.7%).  Rare PVCs. Dyspnea. Echo 04/13/2019: EF 55 to 60%.  No RWMA.  Normal diastolic parameters.  Normal RV size/function.  Mild MR. Nuclear stress test 06/04/2019: Normal, low risk study. Carotid artery disease. Carotid ultrasound 09/10/2018: Mild atherosclerotic disease of bilateral carotid arteries <50% bilaterally. Hypertension. Hyperlipidemia. Lipid panel 08/01/2023: LDL 89, HDL 101, TG 65, total 205.. COPD. PE. T2DM. Gout. CKD stage III. Pancreatic cancer.  In summary, patient was first evaluated by Dr. Cave on 03/30/2019 for abnormal EKG at the request of Colleen Cower, PA-C.  Patient reported a long history of DOE improved with inhalers.  She reported occasional skipped beats.  EKG PCPs office demonstrated PACs.  Given patient's risk factors including stable dyspnea patient was evaluated with echo and nuclear stress test as detailed above.  In late March 2021 patient was admitted to [facility] for palpitation and found to have a PE per chest CTA.  During hospital admission she was noted to have PAF on telemetry.  She was discharged on Eliquis .  Patient was seen in the office on 12/14/2021 for routine follow-up.  She was working on losing  weight in order to be a surgical candidate for any type of orthopedic procedure.  She had lost close to 60 pounds at that point and still had more to go to be eligible for knee surgery.  BP was well-controlled at the time of her visit.   Patient was seen in the clinic for routine follow-up in March 2025.  She reported losing about 100 pounds.  She had both knees replaced in healed well from the surgery.  She continues to perform physical therapy exercises at home.  She had recently been referred to physical therapy to work on balance and gait training to be able to walk without walker or cane.  She had no cardiac complaints at the time of her visit.  Patient was last seen in IR on 09/12/2023 for PAF.  She is followed by oncology for stage IV pancreatic adenocarcinoma and was referred for an outpatient Port-A-Cath placement and was noted to have an irregular rhythm with tachycardia after the procedure.  She was given IV metoprolol  5 mg x 3 with improvement in her heart rate.  Upon evaluation she was in sinus rhythm with frequent PACs.  Stat labs were ordered which revealed severe hypokalemia with a potassium of 2.9.  Patient was started on p.o. potassium 20 mEq twice daily and metoprolol  tartrate 25 mg p.o. twice daily.  She had recently been taken off of antihypertensive medication due to relatively low blood pressure during hospitalization for decreased oral intake and volume depletion.  She improved with IV fluids and blood pressure medicine  were stopped (HCTZ and enalapril ).     History of Present Illness    Today, patient ***  Palpitations/PAF Onset of PAF March 2021 in the setting of PE.  14-day ZIO May 2021 demonstrated predominantly sinus rhythm with runs of symptomatic SVT.  Recently had a run of A-fib in IR after undergoing Port-A-Cath placement.  No spontaneous bleeding concerns. Patient denies palpitations, lightheadedness, dizziness, presyncope or syncope.  EKG*** - Continue Eliquis ,  metoprolol . - CBC today.***  Hypokalemia Patient with recent run of A-fib after having Port-A-Cath placed.  Stat labs demonstrated hypokalemia with potassium 2.9.  She was prescribed p.o. potassium 20 mill equivalents twice daily.  Patient*** - BMP and Mg today.***   Hypertension BP today***.  Patient had recently underwent hospitalization for decreased oral intake and volume depletion.  He improved with IV fluids and blood pressure medications were stopped. -Continue to hold antihypertensives.  Contact office with elevated BP.   Hyperlipidemia LDL 89 July 2025, at goal. -Continue lovastatin .  ROS: All other systems reviewed and are otherwise negative except as noted in History of Present Illness.  EKGs/Labs Reviewed        09/06/2023: ALT 17; AST 24 09/12/2023: BUN 13; Creatinine, Ser 0.67; Potassium 2.9; Sodium 138   09/12/2023: Hemoglobin 10.6; WBC 7.6   08/01/2023: TSH 1.97   No results found for requested labs within last 365 days.  ***  Risk Assessment/Calculations    {Does this patient have ATRIAL FIBRILLATION?:[id number]} No BP recorded.  {Refresh Note OR Click here to enter BP  :1}***        Physical Exam    VS:  There were no vitals taken for this visit. , BMI There is no height or weight on file to calculate BMI.  GEN: Well nourished, well developed, in no acute distress. Neck: No JVD or carotid bruits. Cardiac: *** RRR. *** No murmur. No rubs or gallops.   Respiratory:  Respirations regular and unlabored. Clear to auscultation without rales, wheezing or rhonchi. GI: Soft, nontender, nondistended. Extremities: Radials/DP/PT 2+ and equal bilaterally. No clubbing or cyanosis. No edema ***  Skin: Warm and dry, no rash. Neuro: Strength intact.  Assessment & Plan   ***  Disposition: ***     {Are you ordering a CV Procedure (e.g. stress test, cath, DCCV, TEE, etc)?   Press F2        :[id number]}   Signed, Barnie HERO. Shakisha Abend, DNP, NP-C

## 2023-09-19 ENCOUNTER — Telehealth: Payer: Self-pay | Admitting: Cardiology

## 2023-09-19 ENCOUNTER — Ambulatory Visit: Admitting: Student

## 2023-09-19 NOTE — Telephone Encounter (Signed)
 Pt c/o medication issue:  1. Name of Medication: metoprolol  tartrate (LOPRESSOR ) 25 MG tablet   2. How are you currently taking this medication (dosage and times per day)?   Take 1 tablet (25 mg total) by mouth 2 (two) times daily.    3. Are you having a reaction (difficulty breathing--STAT)? No  4. What is your medication issue? Patient would like to know why she has been prescribed this medication. Please advise.

## 2023-09-19 NOTE — Telephone Encounter (Signed)
 Spoke with emergency contact and reviewed that patient had called and we are unable to reach the patient. She will call patient and have her give us  a call back. No further needs.

## 2023-09-19 NOTE — Telephone Encounter (Signed)
 No answer/No voicemail has been activated.

## 2023-09-19 NOTE — Telephone Encounter (Signed)
 She wanted to know what metoprolol  was for and when it was prescribed. It was ordered while she was in the hospital for her afib and was prescribed by Dr. Darron on 09/12/23. She reports that after that visit while still in the hospital they had a hard time keeping her heart rate up and had to give her medication. She reports that it was in the 40's. She stopped this medication 2 days ago and does not want to take it. Scheduled her to come for her hospital FU and questions. Advised that at that time she can have further discussion regarding her metoprolol . She verbalized understanding with no further needs at this time. Confirmed scheduled appointment for this Friday.

## 2023-09-19 NOTE — Addendum Note (Signed)
 Addended by: Denny Lave on: 09/19/2023 05:06 PM   Modules accepted: Level of Service

## 2023-09-20 ENCOUNTER — Inpatient Hospital Stay

## 2023-09-20 ENCOUNTER — Other Ambulatory Visit: Payer: Self-pay

## 2023-09-20 ENCOUNTER — Ambulatory Visit

## 2023-09-20 ENCOUNTER — Inpatient Hospital Stay (HOSPITAL_BASED_OUTPATIENT_CLINIC_OR_DEPARTMENT_OTHER): Admitting: Oncology

## 2023-09-20 VITALS — BP 118/72 | Temp 97.8°F | Resp 18 | Ht 64.0 in | Wt 177.0 lb

## 2023-09-20 DIAGNOSIS — C259 Malignant neoplasm of pancreas, unspecified: Secondary | ICD-10-CM | POA: Diagnosis not present

## 2023-09-20 DIAGNOSIS — Z5111 Encounter for antineoplastic chemotherapy: Secondary | ICD-10-CM | POA: Diagnosis not present

## 2023-09-20 LAB — CMP (CANCER CENTER ONLY)
ALT: 12 U/L (ref 0–44)
AST: 24 U/L (ref 15–41)
Albumin: 2.9 g/dL — ABNORMAL LOW (ref 3.5–5.0)
Alkaline Phosphatase: 120 U/L (ref 38–126)
Anion gap: 6 (ref 5–15)
BUN: 12 mg/dL (ref 8–23)
CO2: 20 mmol/L — ABNORMAL LOW (ref 22–32)
Calcium: 9.1 mg/dL (ref 8.9–10.3)
Chloride: 112 mmol/L — ABNORMAL HIGH (ref 98–111)
Creatinine: 0.81 mg/dL (ref 0.44–1.00)
GFR, Estimated: >60 mL/min (ref >60)
Glucose, Bld: 122 mg/dL — ABNORMAL HIGH (ref 70–99)
Potassium: 4 mmol/L (ref 3.5–5.1)
Sodium: 138 mmol/L (ref 135–145)
Total Bilirubin: 0.7 mg/dL (ref 0.0–1.2)
Total Protein: 6.7 g/dL (ref 6.5–8.1)

## 2023-09-20 LAB — CBC WITH DIFFERENTIAL (CANCER CENTER ONLY)
Abs Immature Granulocytes: 0.02 K/uL (ref 0.00–0.07)
Basophils Absolute: 0.1 K/uL (ref 0.0–0.1)
Basophils Relative: 1 %
Eosinophils Absolute: 0.1 K/uL (ref 0.0–0.5)
Eosinophils Relative: 2 %
HCT: 32.6 % — ABNORMAL LOW (ref 36.0–46.0)
Hemoglobin: 10.9 g/dL — ABNORMAL LOW (ref 12.0–15.0)
Immature Granulocytes: 0 %
Lymphocytes Relative: 31 %
Lymphs Abs: 2.3 K/uL (ref 0.7–4.0)
MCH: 30.7 pg (ref 26.0–34.0)
MCHC: 33.4 g/dL (ref 30.0–36.0)
MCV: 91.8 fL (ref 80.0–100.0)
Monocytes Absolute: 0.4 K/uL (ref 0.1–1.0)
Monocytes Relative: 6 %
Neutro Abs: 4.3 K/uL (ref 1.7–7.7)
Neutrophils Relative %: 60 %
Platelet Count: 380 K/uL (ref 150–400)
RBC: 3.55 MIL/uL — ABNORMAL LOW (ref 3.87–5.11)
RDW: 14.6 % (ref 11.5–15.5)
WBC Count: 7.2 K/uL (ref 4.0–10.5)
nRBC: 0 % (ref 0.0–0.2)

## 2023-09-20 MED ORDER — SODIUM CHLORIDE 0.9 % IV SOLN
INTRAVENOUS | Status: DC
  Filled 2023-09-20: qty 250

## 2023-09-20 MED ORDER — SODIUM CHLORIDE 0.9 % IV SOLN
1000.0000 mg/m2 | Freq: Once | INTRAVENOUS | Status: AC
  Administered 2023-09-20: 1900 mg via INTRAVENOUS
  Filled 2023-09-20: qty 49.97

## 2023-09-20 MED ORDER — PACLITAXEL PROTEIN-BOUND CHEMO INJECTION 100 MG
125.0000 mg/m2 | Freq: Once | INTRAVENOUS | Status: AC
  Administered 2023-09-20: 225 mg via INTRAVENOUS
  Filled 2023-09-20: qty 45

## 2023-09-20 MED ORDER — PROCHLORPERAZINE MALEATE 10 MG PO TABS
10.0000 mg | ORAL_TABLET | Freq: Once | ORAL | Status: AC
  Administered 2023-09-20: 10 mg via ORAL
  Filled 2023-09-20: qty 1

## 2023-09-20 NOTE — Progress Notes (Signed)
 Nutrition Assessment   Reason for Assessment:   Poor appetite/weight loss   ASSESSMENT:  78 year old female with stage IV pancreatic adenocarcinoma.  Past medical history of COPD, GERD, HLD, HTN, pre-DM, CKD, afib.  Patient starting abraxane  and gemzar  today.  Noted hospitalization s/p biliary stent placement.  Met with patient during infusion.  Reports that her appetite is better these last few weeks than prior to hospitalization.  Reports that she had no appetite prior to admission with just eating a bite or two of food all day.  Now able to eat cereal for breakfast or egg and grits.  Lunch she likes to go to [facility] and get 2 chicken thighs, coleslaw and mashed potatoes. She eats part of it for lunch and the other part for dinner.  Drinks ensure plus (chocolate or vanilla) 1 time a day.  Also eats ice cream sandwiches, some vegetables and fruits.    Patient lives alone.  Niece is power of attorney and lives close by.  Medications: megace , zofran , protonix , MVI, Vit D3   Labs: Na 129, glucose 121, BUN 26, creatinine 1.09   Anthropometrics:   Height: 64 inches Weight: 177 lb  182 lb on 7/14 205 lb on 3/17 BMI: 30  3% weight loss in the last month 14% weight loss in the last 5 months   Estimated Energy Needs  Kcals: 2000-2400 Protein: 80-96 g Fluid: > 2 L   NUTRITION DIAGNOSIS: Inadequate oral intake related to pancreatic cancer as evidenced by initial poor appetite, 3% weight loss in last month now improving    INTERVENTION:  Discussed importance of nutrition during treatment.  Encouraged foods rich in protein, small frequent meals Continue oral nutrition supplement Contact information provided   MONITORING, EVALUATION, GOAL: weight trends, intake   Next Visit: Tuesday, Sept 9 during infusion  Aceton Kinnear B. Dasie SOLON, CSO, LDN Registered Dietitian [phone]

## 2023-09-20 NOTE — Progress Notes (Unsigned)
 [facility]  [phone] Fax:([phone]) [phone]  ID: Colleen [name] OB: [birth year]  MR#: [id number]  CSN#:[id number]  Patient Care Team: Leavy Mole, PA-C as PCP - General (Family Medicine) Darliss Rogue, MD as PCP - Cardiology (Cardiology) Dave Agent, MD as Consulting Physician (Rheumatology) Emilio Garrie BRAVO, RN (Inactive) as Registered Nurse Ceola Tereasa PARAS, MD as Referring Physician (Oncology) Therisa Bi, MD as Consulting Physician (Gastroenterology) Maurie Rayfield BIRCH, RN as Oncology Nurse Navigator Jacobo, Evalene PARAS, MD as Consulting Physician (Oncology)  CHIEF COMPLAINT: Stage IV pancreatic adenocarcinoma.  INTERVAL HISTORY: Patient returns to clinic today for further evaluation, discussion of her PET scan and biopsy results, and treatment plan.  She feels improved over the last week when she was transported to the emergency room.  She continues to have fatigue, but otherwise feels well.  She has no further dizziness and denies any neurologic complaints.  She has no chest pain, shortness of breath, cough, or hemoptysis.  She denies any nausea, vomiting, constipation, or diarrhea.  She has no urinary complaints.  Patient offers no further specific complaints today.    REVIEW OF SYSTEMS:   Review of Systems  Constitutional:  Positive for malaise/fatigue. Negative for fever and weight loss.  Respiratory: Negative.  Negative for cough, hemoptysis and shortness of breath.   Cardiovascular: Negative.  Negative for chest pain and leg swelling.  Gastrointestinal: Negative.  Negative for abdominal pain.  Genitourinary: Negative.  Negative for dysuria.  Musculoskeletal: Negative.  Negative for back pain.  Skin: Negative.  Negative for rash.  Neurological: Negative.  Negative for dizziness, focal weakness, weakness and headaches.  Psychiatric/Behavioral: Negative.  The patient is not nervous/anxious.     As per HPI. Otherwise, a complete review of  systems is negative.  PAST MEDICAL HISTORY: Past Medical History:  Diagnosis Date  . Asthma   . Cataract   . COPD (chronic obstructive pulmonary disease) (HCC)   . GERD (gastroesophageal reflux disease)   . Gout   . History of pulmonary embolus (PE)   . Hyperlipidemia   . Hypertension   . Osteoarthrosis   . Prediabetes     PAST SURGICAL HISTORY: Past Surgical History:  Procedure Laterality Date  . BACK SURGERY    . BREAST BIOPSY Left 04/08/2014   intraductal papilloma 5 mm  . COLONOSCOPY WITH PROPOFOL  N/A 04/23/2020   Procedure: COLONOSCOPY WITH PROPOFOL ;  Surgeon: Janalyn Keene NOVAK, MD;  Location: [facility] ENDOSCOPY;  Service: Endoscopy;  Laterality: N/A;  . ERCP N/A 08/05/2023   Procedure: ERCP, WITH INTERVENTION IF INDICATED;  Surgeon: Jinny Carmine, MD;  Location: [facility] ENDOSCOPY;  Service: Endoscopy;  Laterality: N/A;  . ESOPHAGOGASTRODUODENOSCOPY (EGD) WITH PROPOFOL  N/A 04/23/2020   Procedure: ESOPHAGOGASTRODUODENOSCOPY (EGD) WITH PROPOFOL ;  Surgeon: Janalyn Keene NOVAK, MD;  Location: [facility] ENDOSCOPY;  Service: Endoscopy;  Laterality: N/A;  . GIVENS CAPSULE STUDY N/A 01/28/2022   Procedure: GIVENS CAPSULE STUDY;  Surgeon: Therisa Bi, MD;  Location: [facility] ENDOSCOPY;  Service: Gastroenterology;  Laterality: N/A;  . IR IMAGING GUIDED PORT INSERTION  09/12/2023  . IR INT EXT BILIARY DRAIN WITH CHOLANGIOGRAM  08/08/2023  . LUMBAR LAMINECTOMY    . TONSILLECTOMY      FAMILY HISTORY: Family History  Problem Relation Age of Onset  . Diabetes Mother   . Lung cancer Father   . Diabetes Sister   . Congestive Heart Failure Sister   . Glaucoma Sister   . Breast cancer Maternal Aunt     ADVANCED DIRECTIVES (Y/N):  N  HEALTH MAINTENANCE: Social History   Tobacco Use  . Smoking status: Former    Current packs/day: 0.00    Average packs/day: 1.5 packs/day for 20.0 years (30.0 ttl pk-yrs)    Types: Cigarettes    Start date: 11/13/1988    Quit date: 11/13/2008    Years since  quitting: 14.8  . Smokeless tobacco: Never  . Tobacco comments:    smoking cessation materials not required  Vaping Use  . Vaping status: Never Used  Substance Use Topics  . Alcohol use: Not Currently  . Drug use: No     Colonoscopy:  PAP:  Bone density:  Lipid panel:  Allergies  Allergen Reactions  . Fentanyl  Swelling    It messes with her heart.   . Nsaids     Other Reaction(s): Not available  . Oxycodone  Swelling    Current Outpatient Medications  Medication Sig Dispense Refill  . acetaminophen  (TYLENOL ) 650 MG CR tablet Take 650 mg by mouth every 8 (eight) hours as needed for pain.    . albuterol  (VENTOLIN  HFA) 108 (90 Base) MCG/ACT inhaler Inhale 2 puffs into the lungs every 4 (four) hours as needed for wheezing or shortness of breath. 8 g 2  . allopurinol  (ZYLOPRIM ) 100 MG tablet Take 2 tablets (200 mg total) by mouth daily. 180 tablet 1  . apixaban  (ELIQUIS ) 2.5 MG TABS tablet Take 2 tablets (5 mg total) by mouth 2 (two) times daily.    . Blood Pressure Monitoring (ADULT BLOOD PRESSURE CUFF LG) KIT 1 each by Does not apply route daily. 1 kit 0  . BREZTRI  AEROSPHERE 160-9-4.8 MCG/ACT AERO inhaler INHALE 2 PUFFS TWICE DAILY 33 g 0  . cholecalciferol (VITAMIN D3) 25 MCG (1000 UNIT) tablet Take 1,000 Units by mouth daily.    . lidocaine -prilocaine  (EMLA ) cream Apply to affected area once 30 g 3  . megestrol  (MEGACE ) 20 MG tablet Take 1 tablet (20 mg total) by mouth 2 (two) times daily. 60 tablet 0  . Multiple Vitamin (MULTI-VITAMIN) tablet Take 1 tablet by mouth.  Take 1 tablet by mouth in the morning. 50 plus.    . Multiple Vitamins-Minerals (MULTIVITAMIN WOMEN 50+ PO) Take 1 tablet by mouth daily.    . ondansetron  (ZOFRAN ) 8 MG tablet Take 1 tablet (8 mg total) by mouth every 8 (eight) hours as needed for nausea or vomiting. 60 tablet 2  . ondansetron  (ZOFRAN -ODT) 4 MG disintegrating tablet Take 1 tablet (4 mg total) by mouth every 8 (eight) hours as needed for nausea or  vomiting. 20 tablet 0  . pantoprazole  (PROTONIX ) 40 MG tablet Take 1 tablet (40 mg total) by mouth daily. 90 tablet 1  . potassium chloride  SA (KLOR-CON  M) 20 MEQ tablet Take 1 tablet (20 mEq total) by mouth 2 (two) times daily. 20 tablet 0  . prochlorperazine  (COMPAZINE ) 10 MG tablet Take 1 tablet (10 mg total) by mouth every 6 (six) hours as needed for nausea or vomiting. 60 tablet 2  . simethicone  (GAS-X) 80 MG chewable tablet Chew 1 tablet (80 mg total) by mouth 2 (two) times a day. 60 tablet 2  . sodium chloride  1 g tablet Take 2 tablets (2 g total) by mouth 2 (two) times daily with a meal. 120 tablet 0  . Sodium Chloride  Flush (NORMAL SALINE FLUSH) 0.9 % SOLN Inject 10 mLs into the vein daily. 300 mL 1  . triamcinolone  cream (KENALOG ) 0.1 % APPLY TO AFFECTED AREA TWICE DAILY IF NEEDED. NO MORE THAN 1  WEEK AT A TIME, THEN TAKE A BREAK FOR 2 WEEKS. 80 g 0  . metoprolol  tartrate (LOPRESSOR ) 25 MG tablet Take 1 tablet (25 mg total) by mouth 2 (two) times daily. (Patient not taking: Reported on 09/20/2023) 60 tablet 6   No current facility-administered medications for this visit.    OBJECTIVE: Vitals:   09/20/23 1019  BP: 118/72  Resp: 18  Temp: 97.8 F (36.6 C)     Body mass index is 30.38 kg/m.    ECOG FS:1 - Symptomatic but completely ambulatory  General: Well-developed, well-nourished, no acute distress.  Sitting in a wheelchair. Eyes: Pink conjunctiva, anicteric sclera. HEENT: Normocephalic, moist mucous membranes. Lungs: No audible wheezing or coughing. Heart: Regular rate and rhythm. Abdomen: Soft, nontender, no obvious distention. Musculoskeletal: No edema, cyanosis, or clubbing. Neuro: Alert, answering all questions appropriately. Cranial nerves grossly intact. Skin: No rashes or petechiae noted. Psych: Normal affect.  LAB RESULTS:  Lab Results  Component Value Date   NA 138 09/20/2023   K 4.0 09/20/2023   CL 112 (H) 09/20/2023   CO2 20 (L) 09/20/2023   GLUCOSE 122  (H) 09/20/2023   BUN 12 09/20/2023   CREATININE 0.81 09/20/2023   CALCIUM  9.1 09/20/2023   PROT 6.7 09/20/2023   ALBUMIN 2.9 (L) 09/20/2023   AST 24 09/20/2023   ALT 12 09/20/2023   ALKPHOS 120 09/20/2023   BILITOT 0.7 09/20/2023   GFRNONAA >60 09/20/2023   GFRAA 58 (L) 02/28/2020    Lab Results  Component Value Date   WBC 7.2 09/20/2023   NEUTROABS 4.3 09/20/2023   HGB 10.9 (L) 09/20/2023   HCT 32.6 (L) 09/20/2023   MCV 91.8 09/20/2023   PLT 380 09/20/2023     STUDIES: IR IMAGING GUIDED PORT INSERTION Result Date: 09/12/2023 INDICATION: 78 year old female with pancreatic cancer in need of durable venous access for chemotherapy. EXAM: IMPLANTED PORT A CATH PLACEMENT WITH ULTRASOUND AND FLUOROSCOPIC GUIDANCE MEDICATIONS: None. ANESTHESIA/SEDATION: Versed  2 mg IV; Fentanyl  100 mcg IV; administered intravenously by the Radiology nurse Moderate Sedation Time:  18 minutes The patient's vital signs and level of consciousness were continuously monitored during the procedure by the interventional radiology nurse under my direct supervision. FLUOROSCOPY: Radiation exposure index: 0 mGy, reference air kerma COMPLICATIONS: None immediate. PROCEDURE: The right neck and chest was prepped with chlorhexidine, and draped in the usual sterile fashion using maximum barrier technique (cap and mask, sterile gown, sterile gloves, large sterile sheet, hand hygiene and cutaneous antiseptic). Local anesthesia was attained by infiltration with 1% lidocaine  with epinephrine . Ultrasound demonstrated patency of the right internal jugular vein, and this was documented with an image. Under real-time ultrasound guidance, this vein was accessed with a 21 gauge micropuncture needle and image documentation was performed. A small dermatotomy was made at the access site with an 11 scalpel. A 0.018 wire was advanced into the SVC and the access needle exchanged for a 51F micropuncture vascular sheath. The 0.018 wire was then  removed and a 0.035 wire advanced into the IVC. An appropriate location for the subcutaneous reservoir was selected below the clavicle and an incision was made through the skin and underlying soft tissues. The subcutaneous tissues were then dissected using a combination of blunt and sharp surgical technique and a pocket was formed. A Bard Clear Vue single lumen power injectable portacatheter was then tunneled through the subcutaneous tissues from the pocket to the dermatotomy and the port reservoir placed within the subcutaneous pocket. The venous access site was then  serially dilated and a peel away vascular sheath placed over the wire. The wire was removed and the port catheter advanced into position under fluoroscopic guidance. The catheter tip is positioned in the superior cavoatrial junction. This was documented with a spot image. The portacatheter was then tested and found to flush and aspirate well. The port was flushed with saline followed by 100 units/mL heparinized saline. The pocket was then closed in two layers using first subdermal inverted interrupted absorbable sutures followed by a running subcuticular suture. The epidermis was then sealed with Dermabond. The dermatotomy at the venous access site was also closed with Dermabond. IMPRESSION: Successful placement of a right IJ approach Bard Clear Vue port catheter with ultrasound and fluoroscopic guidance. The catheter is ready for use. Electronically Signed   By: Wilkie Lent M.D.   On: 09/12/2023 13:50   NM PET Image Initial (PI) Skull Base To Thigh Result Date: 08/23/2023 CLINICAL DATA:  Initial treatment strategy for pancreatic mass. EXAM: NUCLEAR MEDICINE PET SKULL BASE TO THIGH TECHNIQUE: 9.14 mCi F-18 FDG was injected intravenously. Full-ring PET imaging was performed from the skull base to thigh after the radiotracer. CT data was obtained and used for attenuation correction and anatomic localization. Fasting blood glucose: 83 mg/dl  COMPARISON:  MRI 92/89/7974.  Ultrasound 08/04/2023. FINDINGS: Mediastinal blood pool activity: SUV max 3.2 Liver activity: SUV max 3.6 NECK: There is a small area of uptake identified along the thoracic along the right side maximum SUV 7.1. There is a very small lymph node in this location on image 29 measuring 3 mm in short axis. This could be an atypical pathologic node. No additional areas of abnormal uptake elsewhere along lymph node chains in the neck. There is some misregistration between PET and CT scan with some motion. Grossly the visualized intracranial compartment has near symmetric uptake. Incidental CT findings: Area paranasal sinuses and mastoid air cells are clear. The parotid glands, submandibular glands and thyroid  gland is preserved. Mild vascular calcifications are seen. CHEST: No specific abnormal uptake above blood pool in the axillary regions, hilum or mediastinum. No areas of abnormal lung uptake. Incidental CT findings: Heart is nonenlarged. No pericardial effusion. Normal caliber thoracic aorta with some scattered vascular calcifications. There is also some coronary artery calcifications as well as some mild calcifications along the great vessels. Slightly patulous thoracic esophagus. Mild linear opacity along the left lung base likely scar or atelectasis. No pleural effusion. ABDOMEN/PELVIS: There are 3 lesions seen in the liver. Two dome lesions on the right side are included. The anterior lesion has maximum SUV of 6.9 on image 68 in the posterior lesion maximum SUV of 6.3 on image 66. These are quite small, under a cm. There is a segment 4B lesion as well with maximum SUV of 6.7 on image 81. Subtle liver metastases are possible. Attention on follow-up. There is significant abnormal uptake corresponding to the pancreatic head mass. This has maximum SUV of 26.0 there on prior MRI measured 2.8 x 1.8 cm. Again pancreatic parenchymal atrophy and ductal dilatation identified. No additional  areas of abnormal uptake. Physiologic distribution radiotracer seen along the other parenchymal organs, bowel and renal collecting systems. No definite areas of abnormal nodal uptake. Incidental CT findings: Left-sided PTC catheter in place. Pigtail extends to the 2/3 portion of the duodenum. Air in the biliary tree. Distended gallbladder. Surgical changes in the left upper quadrant involving the proximal stomach and adjacent tissues. The stomach, small and large bowel are nondilated. Moderate colonic stool  with significant stool in the rectum. Scattered vascular calcifications. No abnormal calcifications seen within either kidney nor along the course of either ureter. Lobular uterus with multiple calcified fibroids. Spleen, adrenal glands are preserved. SKELETON: No focal hypermetabolic activity to suggest skeletal metastasis. Incidental CT findings: Scattered degenerative changes identified. Multilevel stenosis. Sclerosis identified along the right femoral head. IMPRESSION: Known hypermetabolic pancreatic head mass as seen previously. There are 3 small areas of asymmetric increased uptake in the liver which could be small hepatic metastases. Attention on follow-up. Solitary area of uptake the thoracic inlet on the right side correspond a very small lymph node. Atypical lymph node metastasis is possible. Attention on short follow-up. PTC catheter in place with air in the biliary tree. Uterine fibroids. Colonic sigmoid diverticula. Significant colonic stool in the rectum. Electronically Signed   By: Ranell Bring M.D.   On: 08/23/2023 16:10    ASSESSMENT: Stage IV pancreatic adenocarcinoma.  PLAN:    Stage IV pancreatic adenocarcinoma: EUS at [facility] confirmed diagnosis.  PET scan results from August 22, 2023 reviewed independently with 3 hypermetabolic lesions in patient's liver consistent with metastatic disease.  Patient's most recent CA 19-9 is 5172.  After lengthy discussion with the patient, she wishes to  pursue palliative chemotherapy using weekly gemcitabine  and Abraxane  on days 1, 8, and 15 with a 22.  This will be a 28-day cycle.  Patient require port placement prior to initiating treatment.  Return to clinic in 2 weeks for further evaluation and consideration of cycle 1, day 1.  Hyperbilirubinemia: Resolved.  Patient has an external drain in place.  She has an appointment with interventional radiology on September 22, 2023 for drain removal.   Hyponatremia: Sodium level improved to 134.  Monitor.   Hypokalemia: Mild.  Patient potassium is 3.2 today.  Monitor.   Renal insufficiency: Resolved. Transaminitis: Resolved. Anemia: Resolved.  Patient's most recent hemoglobin is 12.2.  Patient expressed understanding and was in agreement with this plan. She also understands that She can call clinic at any time with any questions, concerns, or complaints.    Cancer Staging  Adenocarcinoma of pancreas [facility]) Staging form: Exocrine Pancreas, AJCC 8th Edition - Clinical stage from 09/07/2023: Stage IV (cT2, cN0, cM1) - Signed by Jacobo Evalene PARAS, MD on 09/07/2023 Stage prefix: Initial diagnosis Total positive nodes: 0   Evalene PARAS Jacobo, MD   09/20/2023 10:40 AM

## 2023-09-20 NOTE — Progress Notes (Unsigned)
 Cardiology Clinic Note   Date: 09/23/2023 ID: Julanne, Schlueter 29-Aug-1945, MRN [id number]  Primary Cardiologist:  Redell Cave, MD  Chief Complaint   CARISA BACKHAUS is a 78 y.o. female who presents to the clinic today for hospital follow up.   Patient Profile   LILI HARTS is followed by Dr. Cave for the history outlined below.      Past medical history significant for: Palpitations/PAF.         Onset March 2021 in the setting of PE. 14-day ZIO 06/18/2019: HR 54 to 226 bpm, average 80 bpm. Predominantly sinus rhythm.  2818 runs of SVT fastest 5 beats max rate 226, longest 24.8 seconds average rate 151 bpm.  SVT was detected within +/- 45 seconds of symptomatic patient events.  Some episodes of SVT may be possible A. tach with variable block.  Occasional PACs (2.7%).  Rare PVCs. Dyspnea. Echo 04/13/2019: EF 55 to 60%.  No RWMA.  Normal diastolic parameters.  Normal RV size/function.  Mild MR. Nuclear stress test 06/04/2019: Normal, low risk study. Carotid artery disease. Carotid ultrasound 09/10/2018: Mild atherosclerotic disease of bilateral carotid arteries <50% bilaterally. Hypertension. Hyperlipidemia. Lipid panel 08/01/2023: LDL 89, HDL 101, TG 65, total 205.. COPD. PE. T2DM. Gout. CKD stage III. Pancreatic cancer.   In summary, patient was first evaluated by Dr. Cave on 03/30/2019 for abnormal EKG at the request of Olam Cower, PA-C.  Patient reported a long history of DOE improved with inhalers.  She reported occasional skipped beats.  EKG PCPs office demonstrated PACs.  Given patient's risk factors including stable dyspnea patient was evaluated with echo and nuclear stress test as detailed above.   In late March 2021 patient was admitted to [facility] for palpitation and found to have a PE per chest CTA.  During hospital admission she was noted to have PAF on telemetry.  She was discharged on Eliquis .   Patient was seen in the office on 12/14/2021 for routine  follow-up.  She was working on losing weight in order to be a surgical candidate for any type of orthopedic procedure.  She had lost close to 60 pounds at that point and still had more to go to be eligible for knee surgery.  BP was well-controlled at the time of her visit.    Patient was seen in the clinic for routine follow-up in March 2025.  She reported losing about 100 pounds.  She had both knees replaced in healed well from the surgery.  She continues to perform physical therapy exercises at home.  She had recently been referred to physical therapy to work on balance and gait training to be able to walk without walker or cane.  She had no cardiac complaints at the time of her visit.   Patient was last seen in IR on 09/12/2023 for PAF.  She is followed by oncology for stage IV pancreatic adenocarcinoma and was referred for an outpatient Port-A-Cath placement and was noted to have an irregular rhythm with tachycardia after the procedure.  She was given IV metoprolol  5 mg x 3 with improvement in her heart rate.  Upon evaluation she was in sinus rhythm with frequent PACs.  Stat labs were ordered which revealed severe hypokalemia with a potassium of 2.9.  Patient was started on p.o. potassium 20 mEq twice daily and metoprolol  tartrate 25 mg p.o. twice daily.  She had recently been taken off of antihypertensive medication due to relatively low blood pressure during hospitalization for decreased  oral intake and volume depletion.  She improved with IV fluids and blood pressure medicine were stopped (HCTZ and enalapril ).     History of Present Illness    Today, patient is here alone.She reports she is doing well. Patient denies shortness of breath, dyspnea on exertion, lower extremity edema, orthopnea or PND. No chest pain, pressure, or tightness. No palpitations. She is not taking metoprolol  secondary to increased lower extremity edema that resolved when she stopped it. She is hypotensive on intake today with  BP 80/40. She denies lightheadedness, dizziness, presyncope or syncope. She had her first chemo treatment 3 days ago. She denies nausea, vomiting or diarrhea. She is drinking 8 8oz glasses of water daily. She is eating well.     ROS: All other systems reviewed and are otherwise negative except as noted in History of Present Illness.  EKGs/Labs Reviewed    EKG Interpretation Date/Time:  Friday September 23 2023 11:14:48 EDT Ventricular Rate:  117 PR Interval:  168 QRS Duration:  74 QT Interval:  314 QTC Calculation: 438 R Axis:   -30  Text Interpretation: Sinus tachycardia with PACs Left axis deviation Low voltage QRS Cannot rule out Anterior infarct , age undetermined When compared with ECG of 30-Aug-2023 11:33, PREVIOUS ECG IS PRESENT Reconfirmed by Loistine Sober [phone]) on 09/23/2023 12:57:29 PM   09/20/2023: ALT 12; AST 24; BUN 12; Creatinine 0.81; Potassium 4.0; Sodium 138   09/20/2023: Hemoglobin 10.9; WBC Count 7.2   08/01/2023: TSH 1.97    Risk Assessment/Calculations     CHA2DS2-VASc Score = 5   This indicates a 7.2% annual risk of stroke. The patient's score is based upon: CHF History: 0 HTN History: 1 Diabetes History: 1 Stroke History: 0 Vascular Disease History: 0 Age Score: 2 Gender Score: 1             Physical Exam    VS:  BP (!) 84/42   Pulse (!) 107   Ht 5' 4 (1.626 m)   Wt 173 lb (78.5 kg)   SpO2 98%   BMI 29.70 kg/m  , BMI Body mass index is 29.7 kg/m.  GEN: Well nourished, well developed, in no acute distress. Neck: No JVD or carotid bruits. Cardiac:  RRR. Occasional extrasystole  No murmur. No rubs or gallops.   Respiratory:  Respirations regular and unlabored. Clear to auscultation without rales, wheezing or rhonchi. GI: Soft, nontender, nondistended. Extremities: Radials/DP/PT 2+ and equal bilaterally. No clubbing or cyanosis. No edema.  Skin: Warm and dry, no rash. Neuro: Strength intact.  Assessment & Plan    Palpitations/PAF Onset of PAF March 2021 in the setting of PE.  14-day ZIO May 2021 demonstrated predominantly sinus rhythm with runs of symptomatic SVT.  Recently had a run of A-fib in IR after undergoing Port-A-Cath placement.  No spontaneous bleeding concerns. Patient denies palpitations, lightheadedness, dizziness, presyncope or syncope. She is not taking metoprolol  secondary to it causing lower extremity edema that resolved when she stopped it.  EKG today demonstrates sinus tachycardia with PACs.  - Continue Eliquis .   Hypokalemia Patient with recent run of A-fib after having Port-A-Cath placed.  Stat labs demonstrated hypokalemia with potassium 2.9.  She was prescribed p.o. potassium 20 mill equivalents twice daily.  Labs on 8/26 showed potassium 4.0.  - Decrease potassium to 20 mEq once a day.  - BMP and Mg at follow up.    Hypotension BP today 84/42 on intake and 78/42 and 80/40 on my recheck.  Patient recently underwent  hospitalization for decreased oral intake and volume depletion.  She improved with IV fluids and blood pressure medications were stopped. She denies lightheadedness, dizziness, presyncope or syncope. She had her first chemo treatment 3 days ago. She denies nausea, vomiting or diarrhea. She has been drinking 64+ oz of water a day. She had >16 oz so far this morning. She is eating well. Discussed options secondary to low BP. She adamantly does not want to go to the ED. Discussed with DOD, Dr. Gollan. He agrees to sending patient home with Midodrine  5 mg tid. She will check her BP twice a day through the weekend and call BP readings into the office on Tuesday. She is instructed to continue sodium tablets and continue to push fluids. ED precautions discussed. Patient verbalized understanding and agreement with plan.  - Continue to hold metoprolol .  - Midodrine  5 mg tid. Check BP twice a day. Call office with readings on Tuesday.    Disposition: Midodrine  5 mg tid. Return in  2-3 weeks or sooner as needed.          Signed, Barnie HERO. Zack Crager, DNP, NP-C

## 2023-09-20 NOTE — Progress Notes (Unsigned)
 Patient is ready for her first treatment.

## 2023-09-20 NOTE — Patient Instructions (Signed)

## 2023-09-21 ENCOUNTER — Encounter: Payer: Self-pay | Admitting: Oncology

## 2023-09-21 LAB — CANCER ANTIGEN 19-9: CA 19-9: 6967 U/mL — ABNORMAL HIGH (ref 0–35)

## 2023-09-22 ENCOUNTER — Ambulatory Visit
Admission: RE | Admit: 2023-09-22 | Discharge: 2023-09-22 | Disposition: A | Discharge status: Home / Self Care | Source: Ambulatory Visit | Attending: Oncology | Admitting: Oncology

## 2023-09-22 ENCOUNTER — Other Ambulatory Visit: Payer: Self-pay | Admitting: Oncology

## 2023-09-22 ENCOUNTER — Ambulatory Visit
Admission: RE | Admit: 2023-09-22 | Discharge: 2023-09-22 | Disposition: A | Discharge status: Home / Self Care | Source: Ambulatory Visit | Attending: Urology | Admitting: Urology

## 2023-09-22 DIAGNOSIS — C259 Malignant neoplasm of pancreas, unspecified: Secondary | ICD-10-CM | POA: Diagnosis not present

## 2023-09-22 DIAGNOSIS — K8689 Other specified diseases of pancreas: Secondary | ICD-10-CM

## 2023-09-22 DIAGNOSIS — K831 Obstruction of bile duct: Secondary | ICD-10-CM

## 2023-09-22 HISTORY — PX: IR REMOVAL BILIARY DRAIN: IMG6047

## 2023-09-22 HISTORY — PX: IR RADIOLOGIST EVAL & MGMT: IMG5224

## 2023-09-22 MED ORDER — IOPAMIDOL (ISOVUE-300) INJECTION 61%
50.0000 mL | Freq: Once | INTRAVENOUS | Status: AC | PRN
  Administered 2023-09-22: 5 mL

## 2023-09-22 NOTE — Progress Notes (Signed)
 Referring Physician(s): Dr. Evalene Reusing  Chief Complaint: The patient is seen in follow up today s/p internal/external biliary drain placement 08/08/23.   History of present illness:  Colleen [name], 78 year old female, has a medical history significant for HTN, COPD, CKD, atrial fibrillation on Eliquis  and recently diagnosed metastatic pancreatic cancer. She first presented to the ED 08/04/23 for evaluation of abnormal labs, dark urine and lighter stool. She had visited with her PCP 7/7 and discovered abnormal liver function tests. A RUQ ultrasound was ordered and this showed a dilated common bile duct.   She was admitted to the hospital and underwent MRCP. This was positive for a pancreatic mass consistent with adenocarcinoma. An ERCP was then performed with an unsuccessful attempt at stent placement. IR was consulted for biliary drain placement with brushings and on 08/08/23 an internal/external drain was placed.    An EUS at [facility] late July 2025 confirmed her diagnosis and the team was also able to deploy a stent into the common bile duct. A PET scan showed 3 hypermetabolic lesions in the liver consistent with metastatic disease and after a lengthy discussion with her oncology team the patient is pursuing palliative chemotherapy. She was seen again in IR 09/12/23 for port-a-catheter placement.   She presents to the IR outpatient clinic today for a drain evaluation with possible removal.  Past Medical History:  Diagnosis Date   Asthma    Cataract    COPD (chronic obstructive pulmonary disease) (HCC)    GERD (gastroesophageal reflux disease)    Gout    History of pulmonary embolus (PE)    Hyperlipidemia    Hypertension    Osteoarthrosis    Prediabetes     Past Surgical History:  Procedure Laterality Date   BACK SURGERY     BREAST BIOPSY Left 04/08/2014   intraductal papilloma 5 mm   COLONOSCOPY WITH PROPOFOL  N/A 04/23/2020   Procedure: COLONOSCOPY WITH PROPOFOL ;  Surgeon:  Janalyn Keene NOVAK, MD;  Location: [facility] ENDOSCOPY;  Service: Endoscopy;  Laterality: N/A;   ERCP N/A 08/05/2023   Procedure: ERCP, WITH INTERVENTION IF INDICATED;  Surgeon: Jinny Carmine, MD;  Location: [facility] ENDOSCOPY;  Service: Endoscopy;  Laterality: N/A;   ESOPHAGOGASTRODUODENOSCOPY (EGD) WITH PROPOFOL  N/A 04/23/2020   Procedure: ESOPHAGOGASTRODUODENOSCOPY (EGD) WITH PROPOFOL ;  Surgeon: Janalyn Keene NOVAK, MD;  Location: [facility] ENDOSCOPY;  Service: Endoscopy;  Laterality: N/A;   GIVENS CAPSULE STUDY N/A 01/28/2022   Procedure: GIVENS CAPSULE STUDY;  Surgeon: Therisa Bi, MD;  Location: [facility] ENDOSCOPY;  Service: Gastroenterology;  Laterality: N/A;   IR IMAGING GUIDED PORT INSERTION  09/12/2023   IR INT EXT BILIARY DRAIN WITH CHOLANGIOGRAM  08/08/2023   LUMBAR LAMINECTOMY     TONSILLECTOMY      Allergies: Fentanyl , Nsaids, and Oxycodone   Medications: Prior to Admission medications   Medication Sig Start Date End Date Taking? Authorizing Provider  acetaminophen  (TYLENOL ) 650 MG CR tablet Take 650 mg by mouth every 8 (eight) hours as needed for pain.    [provider]  albuterol  (VENTOLIN  HFA) 108 (90 Base) MCG/ACT inhaler Inhale 2 puffs into the lungs every 4 (four) hours as needed for wheezing or shortness of breath. 03/31/23   Tapia, Leisa, PA-C  allopurinol  (ZYLOPRIM ) 100 MG tablet Take 2 tablets (200 mg total) by mouth daily. 08/01/23   Tapia, Leisa, PA-C  apixaban  (ELIQUIS ) 2.5 MG TABS tablet Take 2 tablets (5 mg total) by mouth 2 (two) times daily. 09/12/23   Darron Deatrice LABOR, MD  Blood Pressure  Monitoring (ADULT BLOOD PRESSURE CUFF LG) KIT 1 each by Does not apply route daily. 09/01/21   Bernardo Fend, DO  BREZTRI  AEROSPHERE 160-9-4.8 MCG/ACT AERO inhaler INHALE 2 PUFFS TWICE DAILY 08/19/23   Tapia, Leisa, PA-C  cholecalciferol (VITAMIN D3) 25 MCG (1000 UNIT) tablet Take 1,000 Units by mouth daily.    [provider]  lidocaine -prilocaine  (EMLA ) cream Apply to affected  area once 09/07/23   Jacobo Evalene PARAS, MD  megestrol  (MEGACE ) 20 MG tablet Take 1 tablet (20 mg total) by mouth 2 (two) times daily. 09/01/23   Alexander, Natalie, DO  metoprolol  tartrate (LOPRESSOR ) 25 MG tablet Take 1 tablet (25 mg total) by mouth 2 (two) times daily. Patient not taking: Reported on 09/20/2023 09/12/23 09/11/24  Darron Deatrice LABOR, MD  Multiple Vitamin (MULTI-VITAMIN) tablet Take 1 tablet by mouth.  Take 1 tablet by mouth in the morning. 50 plus.    [provider]  Multiple Vitamins-Minerals (MULTIVITAMIN WOMEN 50+ PO) Take 1 tablet by mouth daily.    [provider]  ondansetron  (ZOFRAN ) 8 MG tablet Take 1 tablet (8 mg total) by mouth every 8 (eight) hours as needed for nausea or vomiting. 09/07/23   Jacobo Evalene PARAS, MD  ondansetron  (ZOFRAN -ODT) 4 MG disintegrating tablet Take 1 tablet (4 mg total) by mouth every 8 (eight) hours as needed for nausea or vomiting. 08/09/23   Dorinda Drue DASEN, MD  pantoprazole  (PROTONIX ) 40 MG tablet Take 1 tablet (40 mg total) by mouth daily. 09/09/23   Tapia, Leisa, PA-C  potassium chloride  SA (KLOR-CON  M) 20 MEQ tablet Take 1 tablet (20 mEq total) by mouth 2 (two) times daily. 09/12/23   Darron Deatrice LABOR, MD  prochlorperazine  (COMPAZINE ) 10 MG tablet Take 1 tablet (10 mg total) by mouth every 6 (six) hours as needed for nausea or vomiting. 09/07/23   Jacobo Evalene PARAS, MD  simethicone  (GAS-X) 80 MG chewable tablet Chew 1 tablet (80 mg total) by mouth 2 (two) times a day. 08/02/18 06/17/28  Dorothyann Drivers, MD  sodium chloride  1 g tablet Take 2 tablets (2 g total) by mouth 2 (two) times daily with a meal. 09/01/23 10/01/23  Marsa Edelman, DO  Sodium Chloride  Flush (NORMAL SALINE FLUSH) 0.9 % SOLN Inject 10 mLs into the vein daily. 08/09/23 10/08/23  Carim, Charles A, PA-C  triamcinolone  cream (KENALOG ) 0.1 % APPLY TO AFFECTED AREA TWICE DAILY IF NEEDED. NO MORE THAN 1 WEEK AT A TIME, THEN TAKE A BREAK FOR 2 WEEKS. 02/10/22   Bernardo Fend, DO     Family History  Problem Relation Age of Onset   Diabetes Mother    Lung cancer Father    Diabetes Sister    Congestive Heart Failure Sister    Glaucoma Sister    Breast cancer Maternal Aunt     Social History   Socioeconomic History   Marital status: Widowed    Spouse name: Susann   Number of children: 0   Years of education: Not on file   Highest education level: 12th grade  Occupational History   Occupation: Retired  Tobacco Use   Smoking status: Former    Current packs/day: 0.00    Average packs/day: 1.5 packs/day for 20.0 years (30.0 ttl pk-yrs)    Types: Cigarettes    Start date: 11/13/1988    Quit date: 11/13/2008    Years since quitting: 14.8   Smokeless tobacco: Never   Tobacco comments:    smoking cessation materials not required  Vaping  Use   Vaping status: Never Used  Substance and Sexual Activity   Alcohol use: Not Currently   Drug use: No   Sexual activity: Not Currently  Other Topics Concern   Not on file  Social History Narrative   Pt lives alone   Social Drivers of Health   Financial Resource Strain: Low Risk  (12/02/2022)   Overall Financial Resource Strain (CARDIA)    Difficulty of Paying Living Expenses: Not very hard  Food Insecurity: Food Insecurity Present (08/30/2023)   Hunger Vital Sign    Worried About Running Out of Food in the Last Year: Never true    Ran Out of Food in the Last Year: Sometimes true  Transportation Needs: Unmet Transportation Needs (08/30/2023)   PRAPARE - Administrator, Civil Service (Medical): Yes    Lack of Transportation (Non-Medical): No  Physical Activity: Insufficiently Active (12/02/2022)   Exercise Vital Sign    Days of Exercise per Week: 2 days    Minutes of Exercise per Session: 60 min  Stress: No Stress Concern Present (12/02/2022)   [employer] of Occupational Health - Occupational Stress Questionnaire    Feeling of Stress : Not at all  Social Connections: Socially  Isolated (08/30/2023)   Social Connection and Isolation Panel    Frequency of Communication with Friends and Family: More than three times a week    Frequency of Social Gatherings with Friends and Family: More than three times a week    Attends Religious Services: Never    Database administrator or Organizations: No    Attends Banker Meetings: Never    Marital Status: Widowed     Vital Signs: There were no vitals taken for this visit.  Physical Exam Constitutional:      General: She is not in acute distress.    Appearance: She is not ill-appearing.  Pulmonary:     Effort: Pulmonary effort is normal.  Abdominal:     Tenderness: There is no abdominal tenderness.     Comments: Left-sided biliary drain - capped. Suture intact. Skin insertion site is unremarkable.   Skin:    General: Skin is warm and dry.  Neurological:     Mental Status: She is alert and oriented to person, place, and time.     Imaging: No results found.  Labs:  CBC: Recent Labs    09/01/23 0341 09/06/23 1259 09/12/23 1428 09/20/23 0941  WBC 9.6 8.4 7.6 7.2  HGB 11.9* 12.2 10.6* 10.9*  HCT 34.4* 36.0 31.7* 32.6*  PLT 245 275 279 380    COAGS: Recent Labs    08/04/23 2051 08/08/23 0559  INR 1.1 1.1  APTT 34  --     BMP: Recent Labs    09/01/23 0341 09/06/23 1300 09/12/23 1428 09/20/23 0941  NA 134* 134* 138 138  K 3.4* 3.2* 2.9* 4.0  CL 103 105 108 112*  CO2 24 22 22  20*  GLUCOSE 112* 126* 101* 122*  BUN 19 15 13 12   CALCIUM  8.8* 8.7* 8.7* 9.1  CREATININE 0.72 0.78 0.67 0.81  GFRNONAA >60 >60 >60 >60    LIVER FUNCTION TESTS: Recent Labs    08/31/23 0348 09/01/23 0341 09/06/23 1300 09/20/23 0941  BILITOT 1.2 1.0 0.9 0.7  AST 25 25 24 24   ALT 19 18 17 12   ALKPHOS 176* 139* 141* 120  PROT 6.3* 5.7* 6.2* 6.7  ALBUMIN 2.9* 2.6* 2.8* 2.9*    Assessment:  78 year old female with  a history of pancreatic cancer with obstruction of the common bile duct s/p  internal-external biliary drain placement in IR 08/08/23. Patient underwent EUS at [facility] in late July with stent placement into the CBD.   The internal/external biliary drain has been capped since stent placement. The patient has not experienced any abdominal pain and her labs show normal LFTS. The drain was injected with contrast and the contrast flowed through the stent and into the duodenum. The drain was successfully removed without incident. Please see full dictation under the imaging tab in Epic.  No further follow up with IR needed.  Signed: Warren JONELLE Dais, NP 09/22/2023, 11:52 AM   Please refer to Dr. Jacquelin attestation of this note for management and plan.

## 2023-09-23 ENCOUNTER — Encounter: Payer: Self-pay | Admitting: Student

## 2023-09-23 ENCOUNTER — Other Ambulatory Visit: Payer: Self-pay

## 2023-09-23 ENCOUNTER — Ambulatory Visit: Attending: Student | Admitting: Student

## 2023-09-23 ENCOUNTER — Other Ambulatory Visit: Payer: Self-pay | Admitting: Emergency Medicine

## 2023-09-23 VITALS — BP 84/42 | HR 107 | Ht 64.0 in | Wt 173.0 lb

## 2023-09-23 DIAGNOSIS — I480 Paroxysmal atrial fibrillation: Secondary | ICD-10-CM | POA: Diagnosis not present

## 2023-09-23 DIAGNOSIS — I959 Hypotension, unspecified: Secondary | ICD-10-CM

## 2023-09-23 DIAGNOSIS — E876 Hypokalemia: Secondary | ICD-10-CM

## 2023-09-23 MED ORDER — POTASSIUM CHLORIDE CRYS ER 20 MEQ PO TBCR: 20.0000 meq | EXTENDED_RELEASE_TABLET | Freq: Every day | ORAL | 0 refills | Status: DC

## 2023-09-23 MED ORDER — MIDODRINE HCL 5 MG PO TABS: 5.0000 mg | ORAL_TABLET | Freq: Three times a day (TID) | ORAL | 3 refills | Status: DC

## 2023-09-23 MED ORDER — MIDODRINE HCL 2.5 MG PO TABS: 5.0000 mg | ORAL_TABLET | Freq: Three times a day (TID) | ORAL | Status: DC

## 2023-09-23 NOTE — Patient Instructions (Signed)
 Medication Instructions:  Your physician recommends the following medication changes.  STOP TAKING: Metoprolol   START TAKING: Midodrine  5 mg 3 times daily.  DECREASE: Potassium to 20 mEq once daily.  *If you need a refill on your cardiac medications before your next appointment, please call your pharmacy*  Lab Work: None ordered at this time  If you have labs (blood work) drawn today and your tests are completely normal, you will receive your results only by: MyChart Message (if you have MyChart) OR A paper copy in the mail If you have any lab test that is abnormal or we need to change your treatment, we will call you to review the results.  Testing/Procedures: None ordered at this time   Follow-Up: At [facility], you and your health needs are our priority.  As part of our continuing mission to provide you with exceptional heart care, our providers are all part of one team.  This team includes your primary Cardiologist (physician) and Advanced Practice Providers or APPs (Physician Assistants and Nurse Practitioners) who all work together to provide you with the care you need, when you need it.  Your next appointment:   2 - 3 week(s)  Provider:   Redell Cave, MD or Barnie Hila, NP    We recommend signing up for the patient portal called MyChart.  Sign up information is provided on this After Visit Summary.  MyChart is used to connect with patients for Virtual Visits (Telemedicine).  Patients are able to view lab/test results, encounter notes, upcoming appointments, etc.  Non-urgent messages can be sent to your provider as well.   To learn more about what you can do with MyChart, go to [email or web address].   Other Instructions  Avoid alcohol, caffeine, and exercise within 30 minutes before checking blood pressure. Sit still, with feet flat on the floor, in a straight backed, supportive chair.  Rest for 5 minutes.  Support arm on flat surface at  heart level. Bottom of cuff should be placed directly above the bend of the elbow. Take multiple readings.  Write down and bring to all follow up appointments.  Bring cuff to clinic periodically for accuracy comparison.   Take your blood pressure 2 hours after breakfast and 2 hours after your evening meals.

## 2023-09-23 NOTE — Progress Notes (Signed)
 Resent the prescription to patient's pharmacy.  Originally the prescription was entered erroneously as a clinic administered medication.

## 2023-09-25 ENCOUNTER — Other Ambulatory Visit: Payer: Self-pay

## 2023-09-27 ENCOUNTER — Inpatient Hospital Stay: Admitting: Oncology

## 2023-09-27 ENCOUNTER — Inpatient Hospital Stay: Attending: Oncology

## 2023-09-27 ENCOUNTER — Inpatient Hospital Stay

## 2023-09-27 ENCOUNTER — Encounter: Payer: Self-pay | Admitting: Oncology

## 2023-09-27 ENCOUNTER — Telehealth: Payer: Self-pay | Admitting: Emergency Medicine

## 2023-09-27 VITALS — BP 111/57 | HR 92 | Temp 97.8°F | Resp 18 | Ht 64.0 in | Wt 175.0 lb

## 2023-09-27 DIAGNOSIS — C259 Malignant neoplasm of pancreas, unspecified: Secondary | ICD-10-CM

## 2023-09-27 DIAGNOSIS — Z5111 Encounter for antineoplastic chemotherapy: Secondary | ICD-10-CM | POA: Insufficient documentation

## 2023-09-27 DIAGNOSIS — L89312 Pressure ulcer of right buttock, stage 2: Secondary | ICD-10-CM | POA: Insufficient documentation

## 2023-09-27 DIAGNOSIS — K1230 Oral mucositis (ulcerative), unspecified: Secondary | ICD-10-CM | POA: Diagnosis not present

## 2023-09-27 DIAGNOSIS — C7989 Secondary malignant neoplasm of other specified sites: Secondary | ICD-10-CM | POA: Diagnosis not present

## 2023-09-27 DIAGNOSIS — N289 Disorder of kidney and ureter, unspecified: Secondary | ICD-10-CM | POA: Diagnosis not present

## 2023-09-27 DIAGNOSIS — D75839 Thrombocytosis, unspecified: Secondary | ICD-10-CM | POA: Insufficient documentation

## 2023-09-27 DIAGNOSIS — D649 Anemia, unspecified: Secondary | ICD-10-CM | POA: Insufficient documentation

## 2023-09-27 DIAGNOSIS — E876 Hypokalemia: Secondary | ICD-10-CM | POA: Diagnosis not present

## 2023-09-27 DIAGNOSIS — E871 Hypo-osmolality and hyponatremia: Secondary | ICD-10-CM | POA: Insufficient documentation

## 2023-09-27 DIAGNOSIS — N390 Urinary tract infection, site not specified: Secondary | ICD-10-CM | POA: Diagnosis not present

## 2023-09-27 DIAGNOSIS — Z452 Encounter for adjustment and management of vascular access device: Secondary | ICD-10-CM | POA: Diagnosis not present

## 2023-09-27 LAB — CBC WITH DIFFERENTIAL (CANCER CENTER ONLY)
Abs Immature Granulocytes: 0.1 K/uL — ABNORMAL HIGH (ref 0.00–0.07)
Basophils Absolute: 0 K/uL (ref 0.0–0.1)
Basophils Relative: 0 %
Eosinophils Absolute: 0 K/uL (ref 0.0–0.5)
Eosinophils Relative: 0 %
HCT: 27.2 % — ABNORMAL LOW (ref 36.0–46.0)
Hemoglobin: 9.1 g/dL — ABNORMAL LOW (ref 12.0–15.0)
Immature Granulocytes: 1 %
Lymphocytes Relative: 17 %
Lymphs Abs: 1.2 K/uL (ref 0.7–4.0)
MCH: 30.6 pg (ref 26.0–34.0)
MCHC: 33.5 g/dL (ref 30.0–36.0)
MCV: 91.6 fL (ref 80.0–100.0)
Monocytes Absolute: 0.3 K/uL (ref 0.1–1.0)
Monocytes Relative: 4 %
Neutro Abs: 5.8 K/uL (ref 1.7–7.7)
Neutrophils Relative %: 78 %
Platelet Count: 186 K/uL (ref 150–400)
RBC: 2.97 MIL/uL — ABNORMAL LOW (ref 3.87–5.11)
RDW: 13.8 % (ref 11.5–15.5)
WBC Count: 7.5 K/uL (ref 4.0–10.5)
nRBC: 0 % (ref 0.0–0.2)

## 2023-09-27 LAB — CMP (CANCER CENTER ONLY)
ALT: 13 U/L (ref 0–44)
AST: 23 U/L (ref 15–41)
Albumin: 2.6 g/dL — ABNORMAL LOW (ref 3.5–5.0)
Alkaline Phosphatase: 97 U/L (ref 38–126)
Anion gap: 6 (ref 5–15)
BUN: 16 mg/dL (ref 8–23)
CO2: 18 mmol/L — ABNORMAL LOW (ref 22–32)
Calcium: 8.4 mg/dL — ABNORMAL LOW (ref 8.9–10.3)
Chloride: 102 mmol/L (ref 98–111)
Creatinine: 0.95 mg/dL (ref 0.44–1.00)
GFR, Estimated: >60 mL/min (ref >60)
Glucose, Bld: 171 mg/dL — ABNORMAL HIGH (ref 70–99)
Potassium: 3.6 mmol/L (ref 3.5–5.1)
Sodium: 126 mmol/L — ABNORMAL LOW (ref 135–145)
Total Bilirubin: 0.9 mg/dL (ref 0.0–1.2)
Total Protein: 6.7 g/dL (ref 6.5–8.1)

## 2023-09-27 MED ORDER — PROCHLORPERAZINE MALEATE 10 MG PO TABS
10.0000 mg | ORAL_TABLET | Freq: Once | ORAL | Status: AC
  Administered 2023-09-27: 10 mg via ORAL
  Filled 2023-09-27: qty 1

## 2023-09-27 MED ORDER — SODIUM CHLORIDE 0.9% FLUSH
10.0000 mL | INTRAVENOUS | Status: DC | PRN
  Administered 2023-09-27: 10 mL
  Filled 2023-09-27: qty 10

## 2023-09-27 MED ORDER — SODIUM CHLORIDE 0.9 % IV SOLN
INTRAVENOUS | Status: DC
  Filled 2023-09-27: qty 250

## 2023-09-27 MED ORDER — SODIUM CHLORIDE 0.9 % IV SOLN
1000.0000 mg/m2 | Freq: Once | INTRAVENOUS | Status: AC
  Administered 2023-09-27: 1900 mg via INTRAVENOUS
  Filled 2023-09-27: qty 49.97

## 2023-09-27 MED ORDER — PACLITAXEL PROTEIN-BOUND CHEMO INJECTION 100 MG
125.0000 mg/m2 | Freq: Once | INTRAVENOUS | Status: AC
  Administered 2023-09-27: 225 mg via INTRAVENOUS
  Filled 2023-09-27: qty 45

## 2023-09-27 NOTE — Progress Notes (Signed)
 [facility]  [phone] [phone]  ID: Colleen [name] OB: 27-Oct-1945  MR#: [id number]  CSN#:[id number]  Patient Care Team: Leavy Mole, PA-C as PCP - General (Family Medicine) Darliss Rogue, MD as PCP - Cardiology (Cardiology) Dave Agent, MD as Consulting Physician (Rheumatology) Emilio Garrie BRAVO, RN (Inactive) as Registered Nurse Ceola Tereasa PARAS, MD as Referring Physician (Oncology) Therisa Bi, MD as Consulting Physician (Gastroenterology) Maurie Rayfield BIRCH, RN as Oncology Nurse Navigator Jacobo, Evalene PARAS, MD as Consulting Physician (Oncology)  CHIEF COMPLAINT: Stage IV pancreatic adenocarcinoma.  INTERVAL HISTORY: Patient returns to clinic today for further evaluation and consideration of cycle 1, day 8 of gemcitabine  and Abraxane .  She continues to have fatigue, but otherwise feels well.  She tolerated her first treatment without significant side effects.  She has no neurologic complaints.  Her appetite has improved and she has gained weight in the interim.  She has no chest pain, shortness of breath, cough, or hemoptysis.  She has no abdominal pain.  She denies any nausea, vomiting, constipation, or diarrhea.  She has no urinary complaints.  Patient offers no further specific complaints today.  REVIEW OF SYSTEMS:   Review of Systems  Constitutional:  Positive for malaise/fatigue. Negative for fever and weight loss.  Respiratory: Negative.  Negative for cough, hemoptysis and shortness of breath.   Cardiovascular: Negative.  Negative for chest pain and leg swelling.  Gastrointestinal: Negative.  Negative for abdominal pain.  Genitourinary: Negative.  Negative for dysuria.  Musculoskeletal: Negative.  Negative for back pain.  Skin: Negative.  Negative for rash.  Neurological: Negative.  Negative for dizziness, focal weakness, weakness and headaches.  Psychiatric/Behavioral: Negative.  The patient is not nervous/anxious.     As  per HPI. Otherwise, a complete review of systems is negative.  PAST MEDICAL HISTORY: Past Medical History:  Diagnosis Date   Asthma    Cataract    COPD (chronic obstructive pulmonary disease) (HCC)    GERD (gastroesophageal reflux disease)    Gout    History of pulmonary embolus (PE)    Hyperlipidemia    Hypertension    Osteoarthrosis    Prediabetes     PAST SURGICAL HISTORY: Past Surgical History:  Procedure Laterality Date   BACK SURGERY     BREAST BIOPSY Left 04/08/2014   intraductal papilloma 5 mm   COLONOSCOPY WITH PROPOFOL  N/A 04/23/2020   Procedure: COLONOSCOPY WITH PROPOFOL ;  Surgeon: Janalyn Keene NOVAK, MD;  Location: [facility] ENDOSCOPY;  Service: Endoscopy;  Laterality: N/A;   ERCP N/A 08/05/2023   Procedure: ERCP, WITH INTERVENTION IF INDICATED;  Surgeon: Jinny Carmine, MD;  Location: [facility] ENDOSCOPY;  Service: Endoscopy;  Laterality: N/A;   ESOPHAGOGASTRODUODENOSCOPY (EGD) WITH PROPOFOL  N/A 04/23/2020   Procedure: ESOPHAGOGASTRODUODENOSCOPY (EGD) WITH PROPOFOL ;  Surgeon: Janalyn Keene NOVAK, MD;  Location: [facility] ENDOSCOPY;  Service: Endoscopy;  Laterality: N/A;   GIVENS CAPSULE STUDY N/A 01/28/2022   Procedure: GIVENS CAPSULE STUDY;  Surgeon: Therisa Bi, MD;  Location: [facility] ENDOSCOPY;  Service: Gastroenterology;  Laterality: N/A;   IR IMAGING GUIDED PORT INSERTION  09/12/2023   IR INT EXT BILIARY DRAIN WITH CHOLANGIOGRAM  08/08/2023   IR RADIOLOGIST EVAL & MGMT  09/22/2023   IR REMOVAL BILIARY DRAIN  09/22/2023   LUMBAR LAMINECTOMY     TONSILLECTOMY      FAMILY HISTORY: Family History  Problem Relation Age of Onset   Diabetes Mother    Lung cancer Father    Diabetes Sister    Congestive Heart Failure  Sister    Glaucoma Sister    Breast cancer Maternal Aunt     ADVANCED DIRECTIVES (Y/N):  N  HEALTH MAINTENANCE: Social History   Tobacco Use   Smoking status: Former    Current packs/day: 0.00    Average packs/day: 1.5 packs/day for 20.0 years (30.0 ttl pk-yrs)     Types: Cigarettes    Start date: 11/13/1988    Quit date: 11/13/2008    Years since quitting: 14.8   Smokeless tobacco: Never   Tobacco comments:    smoking cessation materials not required  Vaping Use   Vaping status: Never Used  Substance Use Topics   Alcohol use: Not Currently   Drug use: No     Colonoscopy:  PAP:  Bone density:  Lipid panel:  Allergies  Allergen Reactions   Fentanyl  Swelling    It messes with her heart.    Nsaids     Other Reaction(s): Not available   Oxycodone  Swelling    Current Outpatient Medications  Medication Sig Dispense Refill   acetaminophen  (TYLENOL ) 650 MG CR tablet Take 650 mg by mouth every 8 (eight) hours as needed for pain.     albuterol  (VENTOLIN  HFA) 108 (90 Base) MCG/ACT inhaler Inhale 2 puffs into the lungs every 4 (four) hours as needed for wheezing or shortness of breath. 8 g 2   allopurinol  (ZYLOPRIM ) 100 MG tablet Take 2 tablets (200 mg total) by mouth daily. 180 tablet 1   apixaban  (ELIQUIS ) 2.5 MG TABS tablet Take 2 tablets (5 mg total) by mouth 2 (two) times daily.     Blood Pressure Monitoring (ADULT BLOOD PRESSURE CUFF LG) KIT 1 each by Does not apply route daily. 1 kit 0   BREZTRI  AEROSPHERE 160-9-4.8 MCG/ACT AERO inhaler INHALE 2 PUFFS TWICE DAILY 33 g 0   cholecalciferol (VITAMIN D3) 25 MCG (1000 UNIT) tablet Take 1,000 Units by mouth daily.     lidocaine -prilocaine  (EMLA ) cream Apply to affected area once 30 g 3   megestrol  (MEGACE ) 20 MG tablet Take 1 tablet (20 mg total) by mouth 2 (two) times daily. 60 tablet 0   metoprolol  tartrate (LOPRESSOR ) 25 MG tablet Take 1 tablet (25 mg total) by mouth 2 (two) times daily. 60 tablet 6   midodrine  (PROAMATINE ) 5 MG tablet Take 1 tablet (5 mg total) by mouth 3 (three) times daily with meals. 270 tablet 3   Multiple Vitamin (MULTI-VITAMIN) tablet Take 1 tablet by mouth.  Take 1 tablet by mouth in the morning. 50 plus.     Multiple Vitamins-Minerals (MULTIVITAMIN WOMEN 50+ PO)  Take 1 tablet by mouth daily.     ondansetron  (ZOFRAN ) 8 MG tablet Take 1 tablet (8 mg total) by mouth every 8 (eight) hours as needed for nausea or vomiting. 60 tablet 2   ondansetron  (ZOFRAN -ODT) 4 MG disintegrating tablet Take 1 tablet (4 mg total) by mouth every 8 (eight) hours as needed for nausea or vomiting. 20 tablet 0   pantoprazole  (PROTONIX ) 40 MG tablet Take 1 tablet (40 mg total) by mouth daily. 90 tablet 1   potassium chloride  SA (KLOR-CON  M) 20 MEQ tablet Take 1 tablet (20 mEq total) by mouth daily. 20 tablet 0   prochlorperazine  (COMPAZINE ) 10 MG tablet Take 1 tablet (10 mg total) by mouth every 6 (six) hours as needed for nausea or vomiting. 60 tablet 2   simethicone  (GAS-X) 80 MG chewable tablet Chew 1 tablet (80 mg total) by mouth 2 (two) times a day. 60  tablet 2   sodium chloride  1 g tablet Take 2 tablets (2 g total) by mouth 2 (two) times daily with a meal. 120 tablet 0   Sodium Chloride  Flush (NORMAL SALINE FLUSH) 0.9 % SOLN Inject 10 mLs into the vein daily. 300 mL 1   triamcinolone  cream (KENALOG ) 0.1 % APPLY TO AFFECTED AREA TWICE DAILY IF NEEDED. NO MORE THAN 1 WEEK AT A TIME, THEN TAKE A BREAK FOR 2 WEEKS. 80 g 0   No current facility-administered medications for this visit.    OBJECTIVE: Vitals:   09/27/23 0950  BP: (!) 111/57  Pulse: 92  Resp: 18  Temp: 97.8 F (36.6 C)  SpO2: 100%     Body mass index is 30.04 kg/m.    ECOG FS:1 - Symptomatic but completely ambulatory  General: Well-developed, well-nourished, no acute distress. Eyes: Pink conjunctiva, anicteric sclera. HEENT: Normocephalic, moist mucous membranes. Lungs: No audible wheezing or coughing. Heart: Regular rate and rhythm. Abdomen: Soft, nontender, no obvious distention. Musculoskeletal: No edema, cyanosis, or clubbing. Neuro: Alert, answering all questions appropriately. Cranial nerves grossly intact. Skin: No rashes or petechiae noted. Psych: Normal affect.  LAB RESULTS:  Lab Results   Component Value Date   NA 138 09/20/2023   K 4.0 09/20/2023   CL 112 (H) 09/20/2023   CO2 20 (L) 09/20/2023   GLUCOSE 122 (H) 09/20/2023   BUN 12 09/20/2023   CREATININE 0.81 09/20/2023   CALCIUM  9.1 09/20/2023   PROT 6.7 09/20/2023   ALBUMIN 2.9 (L) 09/20/2023   AST 24 09/20/2023   ALT 12 09/20/2023   ALKPHOS 120 09/20/2023   BILITOT 0.7 09/20/2023   GFRNONAA >60 09/20/2023   GFRAA 58 (L) 02/28/2020    Lab Results  Component Value Date   WBC 7.5 09/27/2023   NEUTROABS 5.8 09/27/2023   HGB 9.1 (L) 09/27/2023   HCT 27.2 (L) 09/27/2023   MCV 91.6 09/27/2023   PLT 186 09/27/2023     STUDIES: IR REMOVAL BILIARY DRAIN Result Date: 09/22/2023 CLINICAL DATA:  Patient with a history of metastatic pancreatic cancer with obstruction of the common bile duct status post internal/external biliary drain placed 08/08/2023 in IR. Patient underwent EUS at [facility] with successful placement of stent into the common bile duct. The biliary drain has been capped since the end of July. EXAM: IR CHOLANGIOGRAM THROUGH EXISTING TUBE IR REMOVE BILIARY DRAIN COMPARISON:  IR internal/external biliary drain 08/08/2023. CONTRAST:  5 mL Isovue -300-administered via the existing percutaneous drain. FLUOROSCOPY TIME:  Radiation exposure index as provided by fluoroscopic device: 33.8 mGy air kerma TECHNIQUE: The patient was positioned supine on the fluoroscopy table. A pre-procedural spot fluoroscopic image was obtained of the left upper quadrant and the existing internal/external biliary catheter. Multiple spot fluoroscopic and radiographic images were obtained following the injection of a small amount of contrast via the existing percutaneous drainage catheter. FINDINGS: Contrast was easily injected into the drain with contrast flowing rapidly through the first part of the catheter and then preferentially flowing into the duodenum via the stented common bile duct. The catheter was cut and an Amplatz wire was inserted.  Once the wire was successfully maneuvered through the catheter the biliary drain was removed over the wire without complication. The patient tolerated the removal well. IMPRESSION: Contrast injection confirmed patency of the common bile duct stent. The biliary drain was successfully removed over an Amplatz wire. The site was covered with gauze/Tegaderm. Electronically Signed   By: Wilkie Lent M.D.   On: 09/22/2023  12:23   IR Radiologist Eval & Mgmt Result Date: 09/22/2023 EXAM: See EPIC note CHIEF COMPLAINT: See EPIC note HISTORY OF PRESENT ILLNESS: See EPIC note REVIEW OF SYSTEMS: See EPIC note PHYSICAL EXAMINATION: See EPIC note ASSESSMENT AND PLAN: See EPIC note Electronically Signed   By: Wilkie Lent M.D.   On: 09/22/2023 12:04   IR IMAGING GUIDED PORT INSERTION Result Date: 09/12/2023 INDICATION: 78 year old female with pancreatic cancer in need of durable venous access for chemotherapy. EXAM: IMPLANTED PORT A CATH PLACEMENT WITH ULTRASOUND AND FLUOROSCOPIC GUIDANCE MEDICATIONS: None. ANESTHESIA/SEDATION: Versed  2 mg IV; Fentanyl  100 mcg IV; administered intravenously by the Radiology nurse Moderate Sedation Time:  18 minutes The patient's vital signs and level of consciousness were continuously monitored during the procedure by the interventional radiology nurse under my direct supervision. FLUOROSCOPY: Radiation exposure index: 0 mGy, reference air kerma COMPLICATIONS: None immediate. PROCEDURE: The right neck and chest was prepped with chlorhexidine, and draped in the usual sterile fashion using maximum barrier technique (cap and mask, sterile gown, sterile gloves, large sterile sheet, hand hygiene and cutaneous antiseptic). Local anesthesia was attained by infiltration with 1% lidocaine  with epinephrine . Ultrasound demonstrated patency of the right internal jugular vein, and this was documented with an image. Under real-time ultrasound guidance, this vein was accessed with a 21 gauge  micropuncture needle and image documentation was performed. A small dermatotomy was made at the access site with an 11 scalpel. A 0.018 wire was advanced into the SVC and the access needle exchanged for a 41F micropuncture vascular sheath. The 0.018 wire was then removed and a 0.035 wire advanced into the IVC. An appropriate location for the subcutaneous reservoir was selected below the clavicle and an incision was made through the skin and underlying soft tissues. The subcutaneous tissues were then dissected using a combination of blunt and sharp surgical technique and a pocket was formed. A Bard Clear Vue single lumen power injectable portacatheter was then tunneled through the subcutaneous tissues from the pocket to the dermatotomy and the port reservoir placed within the subcutaneous pocket. The venous access site was then serially dilated and a peel away vascular sheath placed over the wire. The wire was removed and the port catheter advanced into position under fluoroscopic guidance. The catheter tip is positioned in the superior cavoatrial junction. This was documented with a spot image. The portacatheter was then tested and found to flush and aspirate well. The port was flushed with saline followed by 100 units/mL heparinized saline. The pocket was then closed in two layers using first subdermal inverted interrupted absorbable sutures followed by a running subcuticular suture. The epidermis was then sealed with Dermabond. The dermatotomy at the venous access site was also closed with Dermabond. IMPRESSION: Successful placement of a right IJ approach Bard Clear Vue port catheter with ultrasound and fluoroscopic guidance. The catheter is ready for use. Electronically Signed   By: Wilkie Lent M.D.   On: 09/12/2023 13:50    ASSESSMENT: Stage IV pancreatic adenocarcinoma.  PLAN:    Stage IV pancreatic adenocarcinoma: EUS at [facility] confirmed diagnosis.  PET scan results from August 22, 2023 reviewed  independently with 3 hypermetabolic lesions in patient's liver consistent with metastatic disease.  Patient's most recent CA 19-9 is 6967.  After lengthy discussion with the patient, she wishes to pursue palliative chemotherapy using weekly gemcitabine  and Abraxane  on days 1, 8, and 15 with a 22.  This will be a 28-day cycle.  Patient has had port placement.  Proceed  with cycle 1, day 8 of treatment today.  Return to clinic in 1 week for further evaluation and consideration of cycle 1, day 15.   Hyperbilirubinemia: Resolved.  External drain has been removed.   Hyponatremia: Patient's sodium is dropped to 126, monitor.   Hypokalemia: Resolved. Anemia: Patient's hemoglobin continues to trend down and is now 9.1, monitor.  Patient expressed understanding and was in agreement with this plan. She also understands that She can call clinic at any time with any questions, concerns, or complaints.    Cancer Staging  Adenocarcinoma of pancreas [facility]) Staging form: Exocrine Pancreas, AJCC 8th Edition - Clinical stage from 09/07/2023: Stage IV (cT2, cN0, cM1) - Signed by Jacobo Evalene PARAS, MD on 09/07/2023 Stage prefix: Initial diagnosis Total positive nodes: 0   Evalene PARAS Jacobo, MD   09/27/2023 9:54 AM

## 2023-09-27 NOTE — Patient Instructions (Signed)
 [facility] - A DEPT OF [facility]  Discharge Instructions: Thank you for choosing [facility] to provide your oncology and hematology care.  If you have a lab appointment with the Cancer Center, please go directly to the Cancer Center and check in at the registration area.  Wear comfortable clothing and clothing appropriate for easy access to any Portacath or PICC line.   We strive to give you quality time with your provider. You may need to reschedule your appointment if you arrive late (15 or more minutes).  Arriving late affects you and other patients whose appointments are after yours.  Also, if you miss three or more appointments without notifying the office, you may be dismissed from the clinic at the provider's discretion.      For prescription refill requests, have your pharmacy contact our office and allow 72 hours for refills to be completed.    Today you received the following chemotherapy and/or immunotherapy agents: ABRAXANE /GEMZAR    To help prevent nausea and vomiting after your treatment, we encourage you to take your nausea medication as directed.  BELOW ARE SYMPTOMS THAT SHOULD BE REPORTED IMMEDIATELY: *FEVER GREATER THAN 100.4 F (38 C) OR HIGHER *CHILLS OR SWEATING *NAUSEA AND VOMITING THAT IS NOT CONTROLLED WITH YOUR NAUSEA MEDICATION *UNUSUAL SHORTNESS OF BREATH *UNUSUAL BRUISING OR BLEEDING *URINARY PROBLEMS (pain or burning when urinating, or frequent urination) *BOWEL PROBLEMS (unusual diarrhea, constipation, pain near the anus) TENDERNESS IN MOUTH AND THROAT WITH OR WITHOUT PRESENCE OF ULCERS (sore throat, sores in mouth, or a toothache) UNUSUAL RASH, SWELLING OR PAIN  UNUSUAL VAGINAL DISCHARGE OR ITCHING   Items with * indicate a potential emergency and should be followed up as soon as possible or go to the Emergency Department if any problems should occur.  Please show the CHEMOTHERAPY ALERT CARD or  IMMUNOTHERAPY ALERT CARD at check-in to the Emergency Department and triage nurse.  Should you have questions after your visit or need to cancel or reschedule your appointment, please contact [facility] - A DEPT OF [facility]  [phone] and follow the prompts.  Office hours are 8:00 a.m. to 4:30 p.m. Monday - Friday. Please note that voicemails left after 4:00 p.m. may not be returned until the following business day.  We are closed weekends and major holidays. You have access to a nurse at all times for urgent questions. Please call the main number to the clinic [phone] and follow the prompts.  For any non-urgent questions, you may also contact your provider using MyChart. We now offer e-Visits for anyone 39 and older to request care online for non-urgent symptoms. For details visit [email or web address].   Also download the MyChart app! Go to the app store, search MyChart, open the app, select [facility], and log in with your MyChart username and password.

## 2023-09-27 NOTE — Progress Notes (Signed)
 Patient is doing good, no new questions.

## 2023-09-27 NOTE — Telephone Encounter (Signed)
 Called the patient to get some readings from the blood pressure log that she was advised to keep at that last office visit.  When asked, the patient stated that her niece had a death in the family and has not taken any blood pressure readings.  She had 2 numbers from 09/07/23 and 09/09/23.  Patient also stated that her blood pressure cuff is too large and it's hard for her to use by herself.  Patient advised to get the appropriate size blood pressure cuff and advised on how and when to take her blood pressure and record them.  Patient stated that she has a therapist appointment today and they will take her blood pressure there.  Patient asked to call back with that reading as well as any other readings.  Patient verbalized understanding.  Will call patient back tomorrow to get the most recent blood pressure numbers. Patient verbalized understanding of the plan with all questions and concerns addressed at this time.

## 2023-09-29 ENCOUNTER — Other Ambulatory Visit: Payer: Self-pay

## 2023-09-29 ENCOUNTER — Telehealth: Payer: Self-pay | Admitting: Emergency Medicine

## 2023-09-29 NOTE — Telephone Encounter (Signed)
-----   Message from Nurse Ibraheem Voris P sent at 09/23/2023 12:20 PM EDT ----- Regarding: BP Log Call patient to get BP log since Friday 09/23/23

## 2023-09-29 NOTE — Telephone Encounter (Signed)
 Patient returned RN's call.

## 2023-09-29 NOTE — Telephone Encounter (Signed)
 Patient called back to report blood pressure numbers. She only had one reading to report, it was taken when the patient was at her oncology appointment.  Patient reported a reading of 111/60 with HR of 100.  Looking back at the patient's chart, the reading listed is 111/57, HR of 92.  Patient does not have any other readings to report.  Patient states her niece has not come by to take the readings.

## 2023-09-29 NOTE — Telephone Encounter (Signed)
 Called and spoke with the patient's niece, [name]. She stated that she will get the patient to call in with her BP numbers.

## 2023-09-29 NOTE — Telephone Encounter (Signed)
 Patient reports blood pressure 111/60 and heart rate of 100 on Tuesday at MDs office. Patient encouraged to get a blood pressure cuff and monitor twice daily. Patient verbalized understanding.

## 2023-10-01 ENCOUNTER — Other Ambulatory Visit: Payer: Self-pay

## 2023-10-01 ENCOUNTER — Inpatient Hospital Stay
Admission: EM | Admit: 2023-10-01 | Discharge: 2023-10-07 | DRG: 157 | Disposition: A | Discharge status: Home Health | Attending: Obstetrics and Gynecology | Admitting: Obstetrics and Gynecology

## 2023-10-01 ENCOUNTER — Telehealth: Payer: Self-pay | Admitting: Oncology

## 2023-10-01 DIAGNOSIS — Z833 Family history of diabetes mellitus: Secondary | ICD-10-CM

## 2023-10-01 DIAGNOSIS — Z5941 Food insecurity: Secondary | ICD-10-CM

## 2023-10-01 DIAGNOSIS — L89152 Pressure ulcer of sacral region, stage 2: Secondary | ICD-10-CM | POA: Diagnosis present

## 2023-10-01 DIAGNOSIS — Z6829 Body mass index (BMI) 29.0-29.9, adult: Secondary | ICD-10-CM

## 2023-10-01 DIAGNOSIS — C259 Malignant neoplasm of pancreas, unspecified: Secondary | ICD-10-CM | POA: Diagnosis not present

## 2023-10-01 DIAGNOSIS — R1310 Dysphagia, unspecified: Secondary | ICD-10-CM | POA: Diagnosis not present

## 2023-10-01 DIAGNOSIS — M109 Gout, unspecified: Secondary | ICD-10-CM | POA: Diagnosis present

## 2023-10-01 DIAGNOSIS — R54 Age-related physical debility: Secondary | ICD-10-CM | POA: Diagnosis present

## 2023-10-01 DIAGNOSIS — E876 Hypokalemia: Secondary | ICD-10-CM | POA: Diagnosis not present

## 2023-10-01 DIAGNOSIS — Z5982 Transportation insecurity: Secondary | ICD-10-CM

## 2023-10-01 DIAGNOSIS — I959 Hypotension, unspecified: Secondary | ICD-10-CM

## 2023-10-01 DIAGNOSIS — K1230 Oral mucositis (ulcerative), unspecified: Secondary | ICD-10-CM | POA: Diagnosis not present

## 2023-10-01 DIAGNOSIS — I4891 Unspecified atrial fibrillation: Secondary | ICD-10-CM | POA: Diagnosis not present

## 2023-10-01 DIAGNOSIS — K1231 Oral mucositis (ulcerative) due to antineoplastic therapy: Secondary | ICD-10-CM | POA: Diagnosis not present

## 2023-10-01 DIAGNOSIS — Z87891 Personal history of nicotine dependence: Secondary | ICD-10-CM

## 2023-10-01 DIAGNOSIS — I480 Paroxysmal atrial fibrillation: Secondary | ICD-10-CM | POA: Diagnosis not present

## 2023-10-01 DIAGNOSIS — E861 Hypovolemia: Secondary | ICD-10-CM | POA: Diagnosis present

## 2023-10-01 DIAGNOSIS — L8990 Pressure ulcer of unspecified site, unspecified stage: Secondary | ICD-10-CM | POA: Insufficient documentation

## 2023-10-01 DIAGNOSIS — Z7901 Long term (current) use of anticoagulants: Secondary | ICD-10-CM

## 2023-10-01 DIAGNOSIS — Z7951 Long term (current) use of inhaled steroids: Secondary | ICD-10-CM

## 2023-10-01 DIAGNOSIS — K219 Gastro-esophageal reflux disease without esophagitis: Secondary | ICD-10-CM | POA: Diagnosis present

## 2023-10-01 DIAGNOSIS — E162 Hypoglycemia, unspecified: Secondary | ICD-10-CM | POA: Diagnosis present

## 2023-10-01 DIAGNOSIS — Z515 Encounter for palliative care: Secondary | ICD-10-CM | POA: Diagnosis not present

## 2023-10-01 DIAGNOSIS — R627 Adult failure to thrive: Secondary | ICD-10-CM | POA: Diagnosis present

## 2023-10-01 DIAGNOSIS — E785 Hyperlipidemia, unspecified: Secondary | ICD-10-CM | POA: Diagnosis present

## 2023-10-01 DIAGNOSIS — Z79899 Other long term (current) drug therapy: Secondary | ICD-10-CM

## 2023-10-01 DIAGNOSIS — E860 Dehydration: Secondary | ICD-10-CM | POA: Diagnosis present

## 2023-10-01 DIAGNOSIS — M1990 Unspecified osteoarthritis, unspecified site: Secondary | ICD-10-CM | POA: Diagnosis present

## 2023-10-01 DIAGNOSIS — Z886 Allergy status to analgesic agent status: Secondary | ICD-10-CM

## 2023-10-01 DIAGNOSIS — I10 Essential (primary) hypertension: Secondary | ICD-10-CM | POA: Diagnosis present

## 2023-10-01 DIAGNOSIS — Z801 Family history of malignant neoplasm of trachea, bronchus and lung: Secondary | ICD-10-CM

## 2023-10-01 DIAGNOSIS — D61810 Antineoplastic chemotherapy induced pancytopenia: Secondary | ICD-10-CM | POA: Diagnosis present

## 2023-10-01 DIAGNOSIS — Z7189 Other specified counseling: Secondary | ICD-10-CM | POA: Diagnosis not present

## 2023-10-01 DIAGNOSIS — J4489 Other specified chronic obstructive pulmonary disease: Secondary | ICD-10-CM | POA: Diagnosis present

## 2023-10-01 DIAGNOSIS — Z86711 Personal history of pulmonary embolism: Secondary | ICD-10-CM

## 2023-10-01 DIAGNOSIS — Z885 Allergy status to narcotic agent status: Secondary | ICD-10-CM

## 2023-10-01 DIAGNOSIS — T451X5A Adverse effect of antineoplastic and immunosuppressive drugs, initial encounter: Secondary | ICD-10-CM | POA: Diagnosis present

## 2023-10-01 DIAGNOSIS — R4702 Dysphasia: Secondary | ICD-10-CM | POA: Diagnosis present

## 2023-10-01 DIAGNOSIS — R1312 Dysphagia, oropharyngeal phase: Secondary | ICD-10-CM

## 2023-10-01 DIAGNOSIS — E440 Moderate protein-calorie malnutrition: Secondary | ICD-10-CM | POA: Diagnosis present

## 2023-10-01 DIAGNOSIS — Z83511 Family history of glaucoma: Secondary | ICD-10-CM

## 2023-10-01 DIAGNOSIS — Z8249 Family history of ischemic heart disease and other diseases of the circulatory system: Secondary | ICD-10-CM

## 2023-10-01 DIAGNOSIS — Z803 Family history of malignant neoplasm of breast: Secondary | ICD-10-CM

## 2023-10-01 DIAGNOSIS — I951 Orthostatic hypotension: Secondary | ICD-10-CM

## 2023-10-01 LAB — RETICULOCYTES
Immature Retic Fract: 2.8 % (ref 2.3–15.9)
RBC.: 2.5 MIL/uL — ABNORMAL LOW (ref 3.87–5.11)
Retic Count, Absolute: 10.5 10*3/uL — ABNORMAL LOW (ref 19.0–186.0)
Retic Ct Pct: 0.4 % (ref 0.4–3.1)

## 2023-10-01 LAB — COMPREHENSIVE METABOLIC PANEL WITH GFR
ALT: 16 U/L (ref 0–44)
AST: 39 U/L (ref 15–41)
Albumin: 1.9 g/dL — ABNORMAL LOW (ref 3.5–5.0)
Alkaline Phosphatase: 78 U/L (ref 38–126)
Anion gap: 9 (ref 5–15)
BUN: 17 mg/dL (ref 8–23)
CO2: 20 mmol/L — ABNORMAL LOW (ref 22–32)
Calcium: 8.2 mg/dL — ABNORMAL LOW (ref 8.9–10.3)
Chloride: 106 mmol/L (ref 98–111)
Creatinine, Ser: 0.78 mg/dL (ref 0.44–1.00)
GFR, Estimated: >60 mL/min (ref >60)
Glucose, Bld: 123 mg/dL — ABNORMAL HIGH (ref 70–99)
Potassium: 3.5 mmol/L (ref 3.5–5.1)
Sodium: 135 mmol/L (ref 135–145)
Total Bilirubin: 1.4 mg/dL — ABNORMAL HIGH (ref 0.0–1.2)
Total Protein: 6.1 g/dL — ABNORMAL LOW (ref 6.5–8.1)

## 2023-10-01 LAB — LACTIC ACID, PLASMA: Lactic Acid, Venous: 1.2 mmol/L (ref 0.5–1.9)

## 2023-10-01 LAB — CBC
HCT: 23.8 % — ABNORMAL LOW (ref 36.0–46.0)
Hemoglobin: 7.9 g/dL — ABNORMAL LOW (ref 12.0–15.0)
MCH: 31 pg (ref 26.0–34.0)
MCHC: 33.2 g/dL (ref 30.0–36.0)
MCV: 93.3 fL (ref 80.0–100.0)
Platelets: 114 K/uL — ABNORMAL LOW (ref 150–400)
RBC: 2.55 MIL/uL — ABNORMAL LOW (ref 3.87–5.11)
RDW: 13.6 % (ref 11.5–15.5)
WBC: 3.2 K/uL — ABNORMAL LOW (ref 4.0–10.5)
nRBC: 0 % (ref 0.0–0.2)

## 2023-10-01 LAB — IRON AND TIBC: Iron: 62 ug/dL (ref 28–170)

## 2023-10-01 LAB — FERRITIN: Ferritin: 6446 ng/mL — ABNORMAL HIGH (ref 11–307)

## 2023-10-01 LAB — LIPASE, BLOOD: Lipase: 18 U/L (ref 11–51)

## 2023-10-01 LAB — TROPONIN I (HIGH SENSITIVITY)
Troponin I (High Sensitivity): 41 ng/L — ABNORMAL HIGH (ref <18)
Troponin I (High Sensitivity): 35 ng/L — ABNORMAL HIGH (ref <18)

## 2023-10-01 MED ORDER — LACTATED RINGERS IV BOLUS
1000.0000 mL | Freq: Once | INTRAVENOUS | Status: AC
  Administered 2023-10-01: 1000 mL via INTRAVENOUS

## 2023-10-01 MED ORDER — MAGIC MOUTHWASH W/LIDOCAINE
10.0000 mL | Freq: Once | ORAL | Status: AC
  Administered 2023-10-01: 10 mL via ORAL
  Filled 2023-10-01: qty 10

## 2023-10-01 MED ORDER — MIDODRINE HCL 5 MG PO TABS
5.0000 mg | ORAL_TABLET | Freq: Three times a day (TID) | ORAL | Status: DC
  Administered 2023-10-01 – 2023-10-07 (×16): 5 mg via ORAL
  Filled 2023-10-01 (×16): qty 1

## 2023-10-01 MED ORDER — SIMETHICONE 80 MG PO CHEW: 80.0000 mg | CHEWABLE_TABLET | Freq: Four times a day (QID) | ORAL | Status: DC | PRN

## 2023-10-01 MED ORDER — ALLOPURINOL 100 MG PO TABS
200.0000 mg | ORAL_TABLET | Freq: Every day | ORAL | Status: DC
  Administered 2023-10-02 – 2023-10-07 (×6): 200 mg via ORAL
  Filled 2023-10-01 (×6): qty 2

## 2023-10-01 MED ORDER — BUDESON-GLYCOPYRROL-FORMOTEROL 160-9-4.8 MCG/ACT IN AERO
2.0000 | INHALATION_SPRAY | Freq: Two times a day (BID) | RESPIRATORY_TRACT | Status: DC
  Administered 2023-10-02 – 2023-10-07 (×4): 2 via RESPIRATORY_TRACT
  Filled 2023-10-01 (×2): qty 5.9

## 2023-10-01 MED ORDER — ALBUTEROL SULFATE (2.5 MG/3ML) 0.083% IN NEBU: 2.5000 mg | INHALATION_SOLUTION | RESPIRATORY_TRACT | Status: DC | PRN

## 2023-10-01 MED ORDER — MAGIC MOUTHWASH W/LIDOCAINE
10.0000 mL | Freq: Four times a day (QID) | ORAL | Status: DC | PRN
  Administered 2023-10-02: 10 mL via ORAL
  Filled 2023-10-01 (×2): qty 10

## 2023-10-01 MED ORDER — METOPROLOL TARTRATE 5 MG/5ML IV SOLN
2.5000 mg | INTRAVENOUS | Status: DC | PRN
  Filled 2023-10-01: qty 5

## 2023-10-01 MED ORDER — PANTOPRAZOLE SODIUM 40 MG IV SOLR
40.0000 mg | INTRAVENOUS | Status: DC
  Administered 2023-10-01 – 2023-10-03 (×2): 40 mg via INTRAVENOUS
  Filled 2023-10-01 (×2): qty 10

## 2023-10-01 MED ORDER — ACETAMINOPHEN 325 MG PO TABS: 650.0000 mg | ORAL_TABLET | Freq: Three times a day (TID) | ORAL | Status: DC | PRN

## 2023-10-01 MED ORDER — PHENOL 1.4 % MT LIQD: 1.0000 | OROMUCOSAL | Status: DC | PRN

## 2023-10-01 MED ORDER — SODIUM CHLORIDE 0.9 % IV SOLN: INTRAVENOUS | Status: DC

## 2023-10-01 MED ORDER — BISACODYL 5 MG PO TBEC: 5.0000 mg | DELAYED_RELEASE_TABLET | Freq: Every day | ORAL | Status: DC | PRN

## 2023-10-01 MED ORDER — APIXABAN 5 MG PO TABS
5.0000 mg | ORAL_TABLET | Freq: Two times a day (BID) | ORAL | Status: DC
  Administered 2023-10-01 – 2023-10-02 (×2): 5 mg via ORAL
  Filled 2023-10-01 (×2): qty 1

## 2023-10-01 MED ORDER — SUCRALFATE 1 G PO TABS
1.0000 g | ORAL_TABLET | Freq: Three times a day (TID) | ORAL | Status: DC
  Administered 2023-10-01 – 2023-10-07 (×20): 1 g via ORAL
  Filled 2023-10-01 (×23): qty 1

## 2023-10-01 MED ORDER — HYDROMORPHONE HCL 1 MG/ML IJ SOLN: 0.5000 mg | INTRAMUSCULAR | Status: DC | PRN

## 2023-10-01 MED ORDER — LIDOCAINE VISCOUS HCL 2 % MT SOLN: 5.0000 mL | Freq: Four times a day (QID) | OROMUCOSAL | 0 refills | Status: DC | PRN

## 2023-10-01 MED ORDER — ENOXAPARIN SODIUM 40 MG/0.4ML IJ SOSY: 40.0000 mg | PREFILLED_SYRINGE | INTRAMUSCULAR | Status: DC

## 2023-10-01 MED ORDER — ONDANSETRON HCL 4 MG/2ML IJ SOLN
4.0000 mg | Freq: Once | INTRAMUSCULAR | Status: AC
  Administered 2023-10-01: 4 mg via INTRAVENOUS
  Filled 2023-10-01: qty 2

## 2023-10-01 MED ORDER — MENTHOL 3 MG MT LOZG
1.0000 | LOZENGE | OROMUCOSAL | Status: DC | PRN
  Filled 2023-10-01: qty 9

## 2023-10-01 MED ORDER — MEGESTROL ACETATE 20 MG PO TABS
20.0000 mg | ORAL_TABLET | Freq: Two times a day (BID) | ORAL | Status: DC
  Administered 2023-10-01 – 2023-10-07 (×11): 20 mg via ORAL
  Filled 2023-10-01 (×13): qty 1

## 2023-10-01 MED ORDER — PROCHLORPERAZINE EDISYLATE 10 MG/2ML IJ SOLN: 10.0000 mg | Freq: Four times a day (QID) | INTRAMUSCULAR | Status: DC | PRN

## 2023-10-01 NOTE — Telephone Encounter (Signed)
 Niece called to report that patient has mouth sores and can not swallow due to pain. I recommend Magic mouth wash PRN.  Rx was sent to pharmacy.

## 2023-10-01 NOTE — ED Provider Notes (Signed)
 [facility] Provider Note    Event Date/Time   First MD Initiated Contact with Patient 10/01/23 1334     (approximate)   History   Hypotension   HPI  Colleen [name] is a 78 y.o. female with a history of stage IV pancreatic adenocarcinoma, COPD, PE, hypertension, hyperlipidemia, prediabetes, and osteoarthritis who presents with difficulty swallowing for the last several days associated with low blood pressure and generalized weakness.  The patient states that she received chemo on 9/2.  She subsequently developed pain in her mouth and difficulty swallowing.  She states that she is able to swallow some liquids but not much.  She has been unable to eat or take any of her medications.  She denies any difficulty with speaking.  She has no neck pain or swelling.  She denies any chest pain or difficulty breathing.  She has no abdominal pain.  She denies vomiting.  She reports generalized weakness and lightheadedness.  She normally takes midodrine  to support her blood pressure, but has been unable to take it so the blood pressure has been low.  I reviewed the past medical records.  The patient's most recent outpatient encounter was on 9/2 with oncology for follow-up of her cancer.  She was planned for palliative chemotherapy at that time.   Physical Exam   Triage Vital Signs: ED Triage Vitals [10/01/23 1332]  Encounter Vitals Group     BP (!) 82/51     Girls Systolic BP Percentile      Girls Diastolic BP Percentile      Boys Systolic BP Percentile      Boys Diastolic BP Percentile      Pulse Rate 88     Resp 18     Temp 97.9 F (36.6 C)     Temp Source Oral     SpO2 100 %     Weight      Height      Head Circumference      Peak Flow      Pain Score 7     Pain Loc      Pain Education      Exclude from Growth Chart     Most recent vital signs: Vitals:   10/01/23 1327 10/01/23 1332  BP: (!) 86/53 (!) 82/51  Pulse:  88  Resp:  18  Temp:  97.9 F (36.6  C)  SpO2:  100%     General: Awake, no distress.  CV:  Good peripheral perfusion.  Resp:  Normal effort.  Abd:  Soft and nontender.  No distention.  Other:  Oropharynx clear.  No visible thrush.  Dry mucous membranes.   ED Results / Procedures / Treatments   Labs (all labs ordered are listed, but only abnormal results are displayed) Labs Reviewed  COMPREHENSIVE METABOLIC PANEL WITH GFR - Abnormal; Notable for the following components:      Result Value   CO2 20 (*)    Glucose, Bld 123 (*)    Calcium  8.2 (*)    Total Protein 6.1 (*)    Albumin 1.9 (*)    Total Bilirubin 1.4 (*)    All other components within normal limits  CBC - Abnormal; Notable for the following components:   WBC 3.2 (*)    RBC 2.55 (*)    Hemoglobin 7.9 (*)    HCT 23.8 (*)    Platelets 114 (*)    All other components within normal limits  TROPONIN I (  HIGH SENSITIVITY) - Abnormal; Notable for the following components:   Troponin I (High Sensitivity) 41 (*)    All other components within normal limits  LACTIC ACID, PLASMA  LIPASE, BLOOD  URINALYSIS, ROUTINE W REFLEX MICROSCOPIC  LACTIC ACID, PLASMA  CBG MONITORING, ED     EKG  ED ECG REPORT I, Waylon Cassis, the attending physician, personally viewed and interpreted this ECG.  Date: 10/01/2023 EKG Time: 1334 Rate: 106 Rhythm: Borderline tachycardia, possible atrial fibrillation QRS Axis: normal Intervals: normal ST/T Wave abnormalities: normal Narrative Interpretation: no evidence of acute ischemia    RADIOLOGY     PROCEDURES:  Critical Care performed: No  Procedures   MEDICATIONS ORDERED IN ED: Medications  lactated ringers  bolus 1,000 mL (1,000 mLs Intravenous New Bag/Given 10/01/23 1405)  ondansetron  (ZOFRAN ) injection 4 mg (4 mg Intravenous Given 10/01/23 1406)  magic mouthwash w/lidocaine  (10 mLs Oral Given 10/01/23 1408)     IMPRESSION / MDM / ASSESSMENT AND PLAN / ED COURSE  I reviewed the triage vital signs and  the nursing notes.  78 year old female with PMH as noted above presents with inability to swallow due to oral pain as well as low blood pressure.  On exam she is hypotensive.  Other vital signs are normal.  The oropharynx does not demonstrate any thrush or other acute findings.  Differential diagnosis includes, but is not limited to, chemotherapy side effect, GERD, esophageal stricture, esophageal dysmotility, other dysphagia.  Differential for the hypotension includes dehydration/hypovolemia, less likely acute infection or sepsis.  We will give a fluid bolus, obtain lab workup, give antiemetic, Magic mouthwash, and reassess.  Patient's presentation is most consistent with acute presentation with potential threat to life or bodily function.  The patient is on the cardiac monitor to evaluate for evidence of arrhythmia and/or significant heart rate changes.  ----------------------------------------- 2:54 PM on 10/01/2023 -----------------------------------------  The patient's blood pressure is starting to come up with fluids.  Lab workup so far significant for worsening anemia.  WBC count is slightly down.  CMP shows no acute findings.  Lipase is normal.  Troponin is mildly elevated although this appears to be the patient's baseline.  Lactic acid is normal.  Overall presentation favors hypotension due to hypovolemia rather than sepsis.  We have given Magic mouthwash and Zofran  and will continue to monitor the patient's dysphagia.  She will need admission for additional fluids and further management.  I consulted Dr. Laurita from the hospitalist service; based on our discussion he agrees to evaluate the patient for admission.   FINAL CLINICAL IMPRESSION(S) / ED DIAGNOSES   Final diagnoses:  Dysphagia, unspecified type  Hypotension, unspecified hypotension type     Rx / DC Orders   ED Discharge Orders     None        Note:  This document was prepared using Dragon voice recognition  software and may include unintentional dictation errors.    Cassis Waylon, MD 10/01/23 1455

## 2023-10-01 NOTE — H&P (Signed)
 History and Physical    Colleen [name] FMW:[id number] DOB: 10-Jul-1945 DOA: 10/01/2023  PCP: Leavy Mole, PA-C (Confirm with patient/family/NH records and if not entered, this has to be entered at [facility] point of entry) Patient coming from: Home  I have personally briefly reviewed patient's old medical records in [facility]  Chief Complaint: Painful swallowing  HPI: Colleen [name] is a 78 y.o. female with medical history significant of recently diagnosed stage IV metastatic pancreatic cancer on chemotherapy, gout, orthostatic hypotension, GERD, presented with painful swallowing.  Patient was diagnosed with stage IV metastatic pancreatic cancer last month and was started on chemotherapy 2 weeks ago and so far she received 2 infusions with last dose given this passing Tuesday regimen containing gemcitabine  and paclitaxel .  2 days ago patient woke up with severe pain in her mouth and throat, she found that it is very painful to swallow solid food and pills, so for the last few days she has not been able to eat much of the food and since yesterday even swallowing liquid became a problem and she she has been constantly spit out saliva.  She denies any choking or cough.  No abdominal pain no nauseous vomiting or diarrhea.  Went to see oncology today and was prescribed with Magic mouthwash.  ED Course: Afebrile, tachycardia heart rate 100, blood pressure low 86/53 improved to 95/1:50 liter IV bolus, blood work showed sodium 135 potassium 2.5 BUN 17 creatinine 0.8, WBC 3.2 hemoglobin 7.9.   Review of Systems: As per HPI otherwise 14 point review of systems negative.    Past Medical History:  Diagnosis Date   Asthma    Cataract    COPD (chronic obstructive pulmonary disease) (HCC)    GERD (gastroesophageal reflux disease)    Gout    History of pulmonary embolus (PE)    Hyperlipidemia    Hypertension    Osteoarthrosis    Prediabetes     Past Surgical History:  Procedure Laterality Date    BACK SURGERY     BREAST BIOPSY Left 04/08/2014   intraductal papilloma 5 mm   COLONOSCOPY WITH PROPOFOL  N/A 04/23/2020   Procedure: COLONOSCOPY WITH PROPOFOL ;  Surgeon: Janalyn Keene NOVAK, MD;  Location: [facility] ENDOSCOPY;  Service: Endoscopy;  Laterality: N/A;   ERCP N/A 08/05/2023   Procedure: ERCP, WITH INTERVENTION IF INDICATED;  Surgeon: Jinny Carmine, MD;  Location: [facility] ENDOSCOPY;  Service: Endoscopy;  Laterality: N/A;   ESOPHAGOGASTRODUODENOSCOPY (EGD) WITH PROPOFOL  N/A 04/23/2020   Procedure: ESOPHAGOGASTRODUODENOSCOPY (EGD) WITH PROPOFOL ;  Surgeon: Janalyn Keene NOVAK, MD;  Location: [facility] ENDOSCOPY;  Service: Endoscopy;  Laterality: N/A;   GIVENS CAPSULE STUDY N/A 01/28/2022   Procedure: GIVENS CAPSULE STUDY;  Surgeon: Therisa Bi, MD;  Location: [facility] ENDOSCOPY;  Service: Gastroenterology;  Laterality: N/A;   IR IMAGING GUIDED PORT INSERTION  09/12/2023   IR INT EXT BILIARY DRAIN WITH CHOLANGIOGRAM  08/08/2023   IR RADIOLOGIST EVAL & MGMT  09/22/2023   IR REMOVAL BILIARY DRAIN  09/22/2023   LUMBAR LAMINECTOMY     TONSILLECTOMY       reports that she quit smoking about 14 years ago. Her smoking use included cigarettes. She started smoking about 34 years ago. She has a 30 pack-year smoking history. She has never used smokeless tobacco. She reports that she does not currently use alcohol. She reports that she does not use drugs.  Allergies  Allergen Reactions   Fentanyl  Swelling    It messes with her heart.    Nsaids  Other Reaction(s): Not available   Oxycodone  Swelling    Family History  Problem Relation Age of Onset   Diabetes Mother    Lung cancer Father    Diabetes Sister    Congestive Heart Failure Sister    Glaucoma Sister    Breast cancer Maternal Aunt      Prior to Admission medications   Medication Sig Start Date End Date Taking? Authorizing Provider  acetaminophen  (TYLENOL ) 650 MG CR tablet Take 650 mg by mouth every 8 (eight) hours as needed for pain.   Yes  [provider]  albuterol  (VENTOLIN  HFA) 108 (90 Base) MCG/ACT inhaler Inhale 2 puffs into the lungs every 4 (four) hours as needed for wheezing or shortness of breath. 03/31/23  Yes Tapia, Leisa, PA-C  allopurinol  (ZYLOPRIM ) 100 MG tablet Take 2 tablets (200 mg total) by mouth daily. 08/01/23  Yes Tapia, Leisa, PA-C  apixaban  (ELIQUIS ) 2.5 MG TABS tablet Take 2 tablets (5 mg total) by mouth 2 (two) times daily. Patient taking differently: Take 2.5 mg by mouth 2 (two) times daily. 09/12/23  Yes Darron Deatrice LABOR, MD  BREZTRI  AEROSPHERE 160-9-4.8 MCG/ACT AERO inhaler INHALE 2 PUFFS TWICE DAILY 08/19/23  Yes Tapia, Leisa, PA-C  cholecalciferol (VITAMIN D3) 25 MCG (1000 UNIT) tablet Take 1,000 Units by mouth daily.   Yes [provider]  megestrol  (MEGACE ) 20 MG tablet Take 1 tablet (20 mg total) by mouth 2 (two) times daily. 09/01/23  Yes Alexander, Natalie, DO  metoprolol  tartrate (LOPRESSOR ) 25 MG tablet Take 1 tablet (25 mg total) by mouth 2 (two) times daily. 09/12/23 09/11/24 Yes Darron Deatrice LABOR, MD  midodrine  (PROAMATINE ) 5 MG tablet Take 1 tablet (5 mg total) by mouth 3 (three) times daily with meals. 09/23/23  Yes Anner Alm ORN, MD  Multiple Vitamins-Minerals (MULTIVITAMIN WOMEN 50+ PO) Take 1 tablet by mouth daily.   Yes [provider]  ondansetron  (ZOFRAN ) 8 MG tablet Take 1 tablet (8 mg total) by mouth every 8 (eight) hours as needed for nausea or vomiting. 09/07/23  Yes Jacobo Evalene PARAS, MD  ondansetron  (ZOFRAN -ODT) 4 MG disintegrating tablet Take 1 tablet (4 mg total) by mouth every 8 (eight) hours as needed for nausea or vomiting. 08/09/23  Yes Dorinda Drue DASEN, MD  pantoprazole  (PROTONIX ) 40 MG tablet Take 1 tablet (40 mg total) by mouth daily. 09/09/23  Yes Tapia, Leisa, PA-C  potassium chloride  SA (KLOR-CON  M) 20 MEQ tablet Take 1 tablet (20 mEq total) by mouth daily. 09/23/23  Yes Wittenborn, Barnie, NP  prochlorperazine  (COMPAZINE ) 10 MG tablet Take 1 tablet (10  mg total) by mouth every 6 (six) hours as needed for nausea or vomiting. 09/07/23  Yes Jacobo Evalene PARAS, MD  simethicone  (GAS-X) 80 MG chewable tablet Chew 1 tablet (80 mg total) by mouth 2 (two) times a day. 08/02/18 06/17/28 Yes Dorothyann Drivers, MD  sodium chloride  1 g tablet Take 2 tablets (2 g total) by mouth 2 (two) times daily with a meal. 09/01/23 10/01/23 Yes Marsa Edelman, DO  triamcinolone  cream (KENALOG ) 0.1 % APPLY TO AFFECTED AREA TWICE DAILY IF NEEDED. NO MORE THAN 1 WEEK AT A TIME, THEN TAKE A BREAK FOR 2 WEEKS. 02/10/22  Yes Bernardo Fend, DO  Blood Pressure Monitoring (ADULT BLOOD PRESSURE CUFF LG) KIT 1 each by Does not apply route daily. 09/01/21   Bernardo Fend, DO  lidocaine -prilocaine  (EMLA ) cream Apply to affected area once 09/07/23   Jacobo Evalene PARAS, MD  magic mouthwash (lidocaine , diphenhydrAMINE,  alum & mag hydroxide) suspension Swish and spit 5 mLs 4 (four) times daily as needed for mouth pain. 10/01/23   Babara Call, MD  Sodium Chloride  Flush (NORMAL SALINE FLUSH) 0.9 % SOLN Inject 10 mLs into the vein daily. 08/09/23 10/08/23  Birda Carlin LABOR, PA-C    Physical Exam: Vitals:   10/01/23 1332 10/01/23 1400 10/01/23 1430 10/01/23 1500  BP: (!) 82/51 (!) 96/47 (!) 99/53 95/76  Pulse: 88  93 (!) 112  Resp: 18  (!) 24 (!) 28  Temp: 97.9 F (36.6 C)     TempSrc: Oral     SpO2: 100%  98% 94%    Constitutional: NAD, calm, comfortable Vitals:   10/01/23 1332 10/01/23 1400 10/01/23 1430 10/01/23 1500  BP: (!) 82/51 (!) 96/47 (!) 99/53 95/76  Pulse: 88  93 (!) 112  Resp: 18  (!) 24 (!) 28  Temp: 97.9 F (36.6 C)     TempSrc: Oral     SpO2: 100%  98% 94%   Eyes: PERRL, lids and conjunctivae normal ENMT: Mucous membranes are dry.  No significant rash or thrush noticed, posterior pharynx clear of any exudate or lesions.Normal dentition.  Neck: normal, supple, no masses, no thyromegaly Respiratory: clear to auscultation bilaterally, no wheezing, no crackles.  Normal respiratory effort. No accessory muscle use.  Cardiovascular: Regular rate and rhythm, no murmurs / rubs / gallops. No extremity edema. 2+ pedal pulses. No carotid bruits.  Abdomen: no tenderness, no masses palpated. No hepatosplenomegaly. Bowel sounds positive.  Musculoskeletal: no clubbing / cyanosis. No joint deformity upper and lower extremities. Good ROM, no contractures. Normal muscle tone.  Skin: no rashes, lesions, ulcers. No induration Neurologic: CN 2-12 grossly intact. Sensation intact, DTR normal. Strength 5/5 in all 4.  Psychiatric: Normal judgment and insight. Alert and oriented x 3. Normal mood.     Labs on Admission: I have personally reviewed following labs and imaging studies  CBC: Recent Labs  Lab 09/27/23 0939 10/01/23 1356  WBC 7.5 3.2*  NEUTROABS 5.8  --   HGB 9.1* 7.9*  HCT 27.2* 23.8*  MCV 91.6 93.3  PLT 186 114*   Basic Metabolic Panel: Recent Labs  Lab 09/27/23 0939 10/01/23 1356  NA 126* 135  K 3.6 3.5  CL 102 106  CO2 18* 20*  GLUCOSE 171* 123*  BUN 16 17  CREATININE 0.95 0.78  CALCIUM  8.4* 8.2*   GFR: Estimated Creatinine Clearance: 60.1 mL/min (by C-G formula based on SCr of 0.78 mg/dL). Liver Function Tests: Recent Labs  Lab 09/27/23 0939 10/01/23 1356  AST 23 39  ALT 13 16  ALKPHOS 97 78  BILITOT 0.9 1.4*  PROT 6.7 6.1*  ALBUMIN 2.6* 1.9*   Recent Labs  Lab 10/01/23 1356  LIPASE 18   No results for input(s): AMMONIA in the last 168 hours. Coagulation Profile: No results for input(s): INR, PROTIME in the last 168 hours. Cardiac Enzymes: No results for input(s): CKTOTAL, CKMB, CKMBINDEX, TROPONINI in the last 168 hours. BNP (last 3 results) No results for input(s): PROBNP in the last 8760 hours. HbA1C: No results for input(s): HGBA1C in the last 72 hours. CBG: No results for input(s): GLUCAP in the last 168 hours. Lipid Profile: No results for input(s): CHOL, HDL, LDLCALC, TRIG,  CHOLHDL, LDLDIRECT in the last 72 hours. Thyroid  Function Tests: No results for input(s): TSH, T4TOTAL, FREET4, T3FREE, THYROIDAB in the last 72 hours. Anemia Panel: No results for input(s): VITAMINB12, FOLATE, FERRITIN, TIBC, IRON, RETICCTPCT in the  last 72 hours. Urine analysis:    Component Value Date/Time   COLORURINE YELLOW (A) 08/30/2023 1539   APPEARANCEUR CLEAR (A) 08/30/2023 1539   LABSPEC 1.010 08/30/2023 1539   PHURINE 6.0 08/30/2023 1539   GLUCOSEU NEGATIVE 08/30/2023 1539   HGBUR NEGATIVE 08/30/2023 1539   BILIRUBINUR NEGATIVE 08/30/2023 1539   BILIRUBINUR negative 07/26/2022 1046   KETONESUR 5 (A) 08/30/2023 1539   PROTEINUR NEGATIVE 08/30/2023 1539   UROBILINOGEN 0.2 07/26/2022 1046   NITRITE NEGATIVE 08/30/2023 1539   LEUKOCYTESUR NEGATIVE 08/30/2023 1539    Radiological Exams on Admission: No results found.  EKG: Independently reviewed.  Sinus tachycardic with A-fib  Assessment/Plan Principal Problem:   Dysphagia Active Problems:   Dysphasia  (please populate well all problems here in Problem List. (For example, if patient is on BP meds at home and you resume or decide to hold them, it is a problem that needs to be her. Same for CAD, COPD, HLD and so on)  Acute dysphagia and odynophagia - Case was discussed with GI, etiology likely chemotherapy induced mucositis - Discussed with pharmacy regarding regiment for pain control, recommend continue Magic mouthwash, add Cepacol and Chloraseptic spray and Carafate  - Liquid diet for today - IV fluid to correct dehydration  Dehydration Borderline hypotension -Responded to initial IV fluid bolus - Given the ongoing swallowing problems, will keep patient on IV fluid for tonight. - Other DDx, low suspicion for sepsis, hold off antibiotics  PAF  - Will use as needed Lopressor  for rate control for now - Continue Eliquis   Orthostatic hypotension - Continue midodrine   Stage IV pancreatic  cancer - Started chemotherapy 2 weeks ago, oncology aware patient's acute GI symptoms, deferred to oncology to modify chemo regimen.  Total time spent on patient care 55 minutes.  DVT prophylaxis: Eliquis  Code Status: Full code Family Communication: None at bedside Disposition Plan: Expect less than 2 midnight hospital stay Consults called: Curbside consult with GI Admission status: Telemetry observation   Cort ONEIDA Mana MD [facility] Pager [phone]  10/01/2023, 3:38 PM

## 2023-10-01 NOTE — ED Notes (Signed)
 Fall bundle is in place

## 2023-10-01 NOTE — ED Triage Notes (Signed)
 Pt to ED via POV from home. Pt reports has been having difficulty swallowing x2 days. Pt reports SOB and some CP from swallowing issue. Pt has been unable to take medication to increase BP and pt hypotensive in triage.   Pt with hx of pancreatic cancer. Last tx Tuesday. Pt has port

## 2023-10-02 ENCOUNTER — Other Ambulatory Visit: Payer: Self-pay

## 2023-10-02 DIAGNOSIS — I959 Hypotension, unspecified: Secondary | ICD-10-CM

## 2023-10-02 DIAGNOSIS — K219 Gastro-esophageal reflux disease without esophagitis: Secondary | ICD-10-CM | POA: Diagnosis not present

## 2023-10-02 DIAGNOSIS — Z515 Encounter for palliative care: Secondary | ICD-10-CM | POA: Diagnosis not present

## 2023-10-02 DIAGNOSIS — I480 Paroxysmal atrial fibrillation: Secondary | ICD-10-CM | POA: Diagnosis not present

## 2023-10-02 DIAGNOSIS — E860 Dehydration: Secondary | ICD-10-CM | POA: Diagnosis not present

## 2023-10-02 DIAGNOSIS — I951 Orthostatic hypotension: Secondary | ICD-10-CM | POA: Diagnosis not present

## 2023-10-02 DIAGNOSIS — Z5941 Food insecurity: Secondary | ICD-10-CM | POA: Diagnosis not present

## 2023-10-02 DIAGNOSIS — Z803 Family history of malignant neoplasm of breast: Secondary | ICD-10-CM | POA: Diagnosis not present

## 2023-10-02 DIAGNOSIS — Z5982 Transportation insecurity: Secondary | ICD-10-CM | POA: Diagnosis not present

## 2023-10-02 DIAGNOSIS — E861 Hypovolemia: Secondary | ICD-10-CM | POA: Diagnosis not present

## 2023-10-02 DIAGNOSIS — R1312 Dysphagia, oropharyngeal phase: Secondary | ICD-10-CM | POA: Diagnosis not present

## 2023-10-02 DIAGNOSIS — E162 Hypoglycemia, unspecified: Secondary | ICD-10-CM | POA: Diagnosis not present

## 2023-10-02 DIAGNOSIS — L89152 Pressure ulcer of sacral region, stage 2: Secondary | ICD-10-CM | POA: Diagnosis not present

## 2023-10-02 DIAGNOSIS — I4891 Unspecified atrial fibrillation: Secondary | ICD-10-CM | POA: Diagnosis not present

## 2023-10-02 DIAGNOSIS — M1990 Unspecified osteoarthritis, unspecified site: Secondary | ICD-10-CM | POA: Diagnosis not present

## 2023-10-02 DIAGNOSIS — Z86711 Personal history of pulmonary embolism: Secondary | ICD-10-CM | POA: Diagnosis not present

## 2023-10-02 DIAGNOSIS — T451X5A Adverse effect of antineoplastic and immunosuppressive drugs, initial encounter: Secondary | ICD-10-CM | POA: Diagnosis not present

## 2023-10-02 DIAGNOSIS — E440 Moderate protein-calorie malnutrition: Secondary | ICD-10-CM | POA: Diagnosis not present

## 2023-10-02 DIAGNOSIS — R627 Adult failure to thrive: Secondary | ICD-10-CM | POA: Diagnosis not present

## 2023-10-02 DIAGNOSIS — E876 Hypokalemia: Secondary | ICD-10-CM | POA: Diagnosis not present

## 2023-10-02 DIAGNOSIS — M109 Gout, unspecified: Secondary | ICD-10-CM | POA: Diagnosis not present

## 2023-10-02 DIAGNOSIS — Z83511 Family history of glaucoma: Secondary | ICD-10-CM | POA: Diagnosis not present

## 2023-10-02 DIAGNOSIS — Z7189 Other specified counseling: Secondary | ICD-10-CM | POA: Diagnosis not present

## 2023-10-02 DIAGNOSIS — C259 Malignant neoplasm of pancreas, unspecified: Secondary | ICD-10-CM | POA: Diagnosis not present

## 2023-10-02 DIAGNOSIS — R1310 Dysphagia, unspecified: Secondary | ICD-10-CM | POA: Diagnosis not present

## 2023-10-02 DIAGNOSIS — D61810 Antineoplastic chemotherapy induced pancytopenia: Secondary | ICD-10-CM | POA: Diagnosis not present

## 2023-10-02 DIAGNOSIS — K1231 Oral mucositis (ulcerative) due to antineoplastic therapy: Secondary | ICD-10-CM | POA: Diagnosis not present

## 2023-10-02 DIAGNOSIS — I10 Essential (primary) hypertension: Secondary | ICD-10-CM | POA: Diagnosis not present

## 2023-10-02 DIAGNOSIS — E785 Hyperlipidemia, unspecified: Secondary | ICD-10-CM | POA: Diagnosis not present

## 2023-10-02 DIAGNOSIS — J4489 Other specified chronic obstructive pulmonary disease: Secondary | ICD-10-CM | POA: Diagnosis not present

## 2023-10-02 LAB — BASIC METABOLIC PANEL WITH GFR
Anion gap: 6 (ref 5–15)
BUN: 15 mg/dL (ref 8–23)
CO2: 23 mmol/L (ref 22–32)
Calcium: 7.8 mg/dL — ABNORMAL LOW (ref 8.9–10.3)
Chloride: 109 mmol/L (ref 98–111)
Creatinine, Ser: 0.61 mg/dL (ref 0.44–1.00)
GFR, Estimated: >60 mL/min (ref >60)
Glucose, Bld: 98 mg/dL (ref 70–99)
Potassium: 3.5 mmol/L (ref 3.5–5.1)
Sodium: 138 mmol/L (ref 135–145)

## 2023-10-02 LAB — ABO/RH: ABO/RH(D): A POS

## 2023-10-02 LAB — CBC
HCT: 21.4 % — ABNORMAL LOW (ref 36.0–46.0)
Hemoglobin: 7.1 g/dL — ABNORMAL LOW (ref 12.0–15.0)
MCH: 30.7 pg (ref 26.0–34.0)
MCHC: 33.2 g/dL (ref 30.0–36.0)
MCV: 92.6 fL (ref 80.0–100.0)
Platelets: 80 K/uL — ABNORMAL LOW (ref 150–400)
RBC: 2.31 MIL/uL — ABNORMAL LOW (ref 3.87–5.11)
RDW: 13.6 % (ref 11.5–15.5)
WBC: 2.1 K/uL — ABNORMAL LOW (ref 4.0–10.5)
nRBC: 0 % (ref 0.0–0.2)

## 2023-10-02 LAB — HEMOGLOBIN AND HEMATOCRIT, BLOOD
HCT: 26.2 % — ABNORMAL LOW (ref 36.0–46.0)
Hemoglobin: 8.9 g/dL — ABNORMAL LOW (ref 12.0–15.0)

## 2023-10-02 LAB — GLUCOSE, CAPILLARY
Glucose-Capillary: 101 mg/dL — ABNORMAL HIGH (ref 70–99)
Glucose-Capillary: 123 mg/dL — ABNORMAL HIGH (ref 70–99)
Glucose-Capillary: 48 mg/dL — ABNORMAL LOW (ref 70–99)
Glucose-Capillary: 60 mg/dL — ABNORMAL LOW (ref 70–99)
Glucose-Capillary: 80 mg/dL (ref 70–99)

## 2023-10-02 LAB — MAGNESIUM
Magnesium: 1.6 mg/dL — ABNORMAL LOW (ref 1.7–2.4)
Magnesium: 2.1 mg/dL (ref 1.7–2.4)

## 2023-10-02 LAB — MRSA NEXT GEN BY PCR, NASAL: MRSA by PCR Next Gen: NOT DETECTED

## 2023-10-02 LAB — TSH: TSH: 2.265 u[IU]/mL (ref 0.350–4.500)

## 2023-10-02 MED ORDER — DEXTROSE 50 % IV SOLN
INTRAVENOUS | Status: AC
  Filled 2023-10-02: qty 50

## 2023-10-02 MED ORDER — METOPROLOL TARTRATE 5 MG/5ML IV SOLN
5.0000 mg | INTRAVENOUS | Status: AC
  Administered 2023-10-02: 5 mg via INTRAVENOUS

## 2023-10-02 MED ORDER — CHLORHEXIDINE GLUCONATE CLOTH 2 % EX PADS
6.0000 | MEDICATED_PAD | Freq: Every day | CUTANEOUS | Status: DC
  Administered 2023-10-02 – 2023-10-07 (×6): 6 via TOPICAL

## 2023-10-02 MED ORDER — SODIUM CHLORIDE 0.9% IV SOLUTION: Freq: Once | INTRAVENOUS | Status: AC

## 2023-10-02 MED ORDER — MAGIC MOUTHWASH W/LIDOCAINE
10.0000 mL | Freq: Four times a day (QID) | ORAL | Status: DC
  Administered 2023-10-02 – 2023-10-07 (×17): 10 mL via ORAL
  Filled 2023-10-02 (×24): qty 10

## 2023-10-02 MED ORDER — MAGNESIUM SULFATE 4 GM/100ML IV SOLN: 4.0000 g | Freq: Once | INTRAVENOUS | Status: DC

## 2023-10-02 MED ORDER — DEXTROSE 5 % IV SOLN: INTRAVENOUS | Status: AC

## 2023-10-02 MED ORDER — SODIUM CHLORIDE 0.9% FLUSH
10.0000 mL | Freq: Two times a day (BID) | INTRAVENOUS | Status: DC
  Administered 2023-10-02: 20 mL
  Administered 2023-10-02 – 2023-10-03 (×3): 10 mL
  Administered 2023-10-04: 20 mL
  Administered 2023-10-04 – 2023-10-07 (×5): 10 mL

## 2023-10-02 MED ORDER — MAGNESIUM SULFATE 2 GM/50ML IV SOLN
2.0000 g | Freq: Once | INTRAVENOUS | Status: AC
  Administered 2023-10-02: 2 g via INTRAVENOUS
  Filled 2023-10-02: qty 50

## 2023-10-02 MED ORDER — AMIODARONE IV BOLUS ONLY 150 MG/100ML
150.0000 mg | Freq: Once | INTRAVENOUS | Status: AC
  Administered 2023-10-02: 150 mg via INTRAVENOUS
  Filled 2023-10-02: qty 100

## 2023-10-02 MED ORDER — SODIUM CHLORIDE 0.9% IV SOLUTION: Freq: Once | INTRAVENOUS | Status: DC

## 2023-10-02 MED ORDER — SCOPOLAMINE 1 MG/3DAYS TD PT72
1.0000 | MEDICATED_PATCH | TRANSDERMAL | Status: DC
  Administered 2023-10-02 – 2023-10-05 (×2): 1 mg via TRANSDERMAL
  Filled 2023-10-02 (×2): qty 1

## 2023-10-02 MED ORDER — ENOXAPARIN SODIUM 80 MG/0.8ML IJ SOSY
1.0000 mg/kg | PREFILLED_SYRINGE | Freq: Two times a day (BID) | INTRAMUSCULAR | Status: DC
  Filled 2023-10-02: qty 0.8

## 2023-10-02 MED ORDER — SODIUM CHLORIDE 0.9% FLUSH
10.0000 mL | INTRAVENOUS | Status: DC | PRN
  Administered 2023-10-02: 10 mL

## 2023-10-02 NOTE — Progress Notes (Signed)
 Order received from provider to place purwik.

## 2023-10-02 NOTE — Progress Notes (Signed)
 [facility]  - [place] [facility]   PATIENT NAME: Colleen [name]    MR#:  [id number]  DATE OF BIRTH:  Dec 24, 1945  SUBJECTIVE:  no family at bedside. I left a message for knees on the phone. Patient is being transferred to ICU step down unit. Heartrate started creeping up to the 200s. EKG showed rapid a fib with RVR. Patient complains of not feeling good. Not able to give much details. Denies any chest pain. Received one dose of 5 mg IV metal and loaded with IV amiodarone . Dr. Raford cardiology was at bedside as well.  Patient has significant dysphagia and unable to swallow. She completed her second round of chemotherapy for metastatic pancreatic cancer.   VITALS:  Blood pressure (!) 72/52, pulse 67, temperature 97.9 F (36.6 C), temperature source Axillary, resp. rate 19, SpO2 96%.  PHYSICAL EXAMINATION:   GENERAL:  78 y.o.-year-old patient with acute distress. Weak deconditioned LUNGS: decreased breath sounds bilaterally, no wheezing CARDIOVASCULAR: S1, S2 normal. Tachycardia ABDOMEN: Soft, nontender, nondistended. Bowel sounds present.  EXTREMITIES: No  edema b/l.    NEUROLOGIC: nonfocal  patient is alert and awake, deconditioned week SKIN: No obvious rash, lesion, or ulcer.   LABORATORY PANEL:  CBC Recent Labs  Lab 10/02/23 0436  WBC 2.1*  HGB 7.1*  HCT 21.4*  PLT 80*    Chemistries  Recent Labs  Lab 10/01/23 1356 10/02/23 0436  NA 135 138  K 3.5 3.5  CL 106 109  CO2 20* 23  GLUCOSE 123* 98  BUN 17 15  CREATININE 0.78 0.61  CALCIUM  8.2* 7.8*  AST 39  --   ALT 16  --   ALKPHOS 78  --   BILITOT 1.4*  --     Assessment and Plan  Colleen [name] is a 78 y.o. female with medical history significant of recently diagnosed stage IV metastatic pancreatic cancer on chemotherapy, gout, orthostatic hypotension, GERD, presented with painful swallowing.   Patient was diagnosed with stage IV metastatic pancreatic cancer last month and was started on  chemotherapy 2 weeks ago and so far she received 2 infusions with last dose given this passing Tuesday regimen containing gemcitabine  and paclitaxel .  2 days ago patient woke up with severe pain in her mouth and throat, she found that it is very painful to swallow solid food and pills, so for the last few days she has not been able to eat much of the food and since yesterday even swallowing liquid became a problem and she she has been constantly spit out saliva.  Rapid a fib with RVR history of paroxysmal a fib on eliquis  follows with Dr. Darron -- will transfer patient to ICU step down -- [facility] cardiology Dr. Raford consulted-- given soft blood pressure will start patient on amiodarone  drip after loading already received. -- Continue eliquis -- will change to Lovenox  shots if unable to swallow --check mag and replace if low --will give IV mag 2g x1  Acute Odynophagia/suspected mucositis with current chemo -- magic wash with lidocaine  -- clear liquid diet -- PRN pain meds  Severe pancytopenia due to chemo Severe anemia/no active bleeding --will give 2 units BT--pt agreeable --will ask oncology to see in am to follow labs  Hypoglycemia due to poor po intake --start d5w gtt  Hypotension --cont midodrine   Stage IV pancreatic cancer -- patient follows with Dr. Jacobo. Started palliative chemotherapy two weeks ago  History of COPD -- appears stable -- PRN nebs  Will consider  palliative care for goals of care     Procedures: Family communication : left voicemail for niece Consults : [facility] cardiology CODE STATUS: FULL DVT Prophylaxis :eliquis --change to lovenox  since pt cannot swallow Level of care: Stepdown Status is: inpatient    TOTAL CricitalTIME TAKING CARE OF THIS PATIENT: 55 minutes.  >50% time spent on counselling and coordination of care  Note: This dictation was prepared with Dragon dictation along with smaller phrase technology. Any transcriptional errors that  result from this process are unintentional.  Leita Blanch M.D    [facility]   CC: Primary care physician; Leavy Mole, PA-C

## 2023-10-02 NOTE — Significant Event (Addendum)
 Rapid Response Event Note   Reason for Call : called RRT for Afib/RVR   Initial Focused Assessment: laying in bed, HR in 170-180's, alert and oriented x 3, see flowsheets for VS.      Interventions: Dr Tobie and Dr Raford at bedside, 5mg  metoprolol  iv, amiodorone bolus, stat EKG and labs, and transfer to stepdown bed.   Plan of Care: as above    Event Summary: as above  MD Notified: Tobie armin Raford Call [phone] Arrival [phone] End [id number]:[id number]  Sakoya Win A, RN

## 2023-10-02 NOTE — Plan of Care (Signed)
 Continuing with plan of care.

## 2023-10-02 NOTE — Progress Notes (Signed)
 Received patient from med-surg unit to ICU 18, upon arrival patient's CBG reading was 48 and patient received dextrose  amp and B/P reading 89/43 (58) provider notified, received order to check CBG every 4 hours and fluids D5 at 40mL/hr.

## 2023-10-02 NOTE — Consult Note (Signed)
 Cardiology Consultation   Patient ID: Colleen [name] MRN: [id number]; DOB: 1945/05/30  Admit date: 10/01/2023 Date of Consult: 10/02/2023  PCP:  Leavy Mole, PA-C   [facility] Providers Cardiologist:  Redell Cave, MD        Patient Profile: Colleen [name] is a 78 y.o. female with a hx of recently diagnosed stage IV metastatic pancreatic cancer on chemotherapy, gout, orthostatic hypotension, COPD, hypertension, hyperlipidemia, and PAF who is being seen 10/02/2023 for the evaluation of atrial fibrillation with RVR at the request of Dr. Tobie.  History of Present Illness: Ms. Esses was recently diagnosed with stage IV metastatic pancreatic cancer.  She was started on chemotherapy 2 weeks ago.  She has been getting gemcitabine  and paclitaxel .  She did struggle with mucositis and was admitted for inability to eat or drink.  She was put scribed Magic mouthwash.  In the ED she was afebrile and blood pressure was initially 86/53.  She had sinus tachycardia at 100 bpm.  She was started on IV fluids.  She was noted to be hypokalemic with potassium 2.5 which was supplemented.  Hemoglobin 7.9.  Cardiology was called acutely to a rapid response due to sudden onset tachycardia.  She was noted to be in atrial fibrillation with rapid ventricular response.  Ventricular rates were in the 160s.  She did note that she had palpitations.  She denies shortness of breath.  She reports having a history of atrial fibrillation and is chronically on Eliquis .  She was somewhat disoriented.  She was oriented to self and thought she was at [facility].  She reported the year was in the 1960s but was aware that Nancyann Frohlich was current president.   Past Medical History:  Diagnosis Date   Asthma    Cataract    COPD (chronic obstructive pulmonary disease) (HCC)    GERD (gastroesophageal reflux disease)    Gout    History of pulmonary embolus (PE)    Hyperlipidemia    Hypertension     Osteoarthrosis    Prediabetes     Past Surgical History:  Procedure Laterality Date   BACK SURGERY     BREAST BIOPSY Left 04/08/2014   intraductal papilloma 5 mm   COLONOSCOPY WITH PROPOFOL  N/A 04/23/2020   Procedure: COLONOSCOPY WITH PROPOFOL ;  Surgeon: Janalyn Keene NOVAK, MD;  Location: [facility] ENDOSCOPY;  Service: Endoscopy;  Laterality: N/A;   ERCP N/A 08/05/2023   Procedure: ERCP, WITH INTERVENTION IF INDICATED;  Surgeon: Jinny Carmine, MD;  Location: [facility] ENDOSCOPY;  Service: Endoscopy;  Laterality: N/A;   ESOPHAGOGASTRODUODENOSCOPY (EGD) WITH PROPOFOL  N/A 04/23/2020   Procedure: ESOPHAGOGASTRODUODENOSCOPY (EGD) WITH PROPOFOL ;  Surgeon: Janalyn Keene NOVAK, MD;  Location: [facility] ENDOSCOPY;  Service: Endoscopy;  Laterality: N/A;   GIVENS CAPSULE STUDY N/A 01/28/2022   Procedure: GIVENS CAPSULE STUDY;  Surgeon: Therisa Bi, MD;  Location: [facility] ENDOSCOPY;  Service: Gastroenterology;  Laterality: N/A;   IR IMAGING GUIDED PORT INSERTION  09/12/2023   IR INT EXT BILIARY DRAIN WITH CHOLANGIOGRAM  08/08/2023   IR RADIOLOGIST EVAL & MGMT  09/22/2023   IR REMOVAL BILIARY DRAIN  09/22/2023   LUMBAR LAMINECTOMY     TONSILLECTOMY       Home Medications:  Prior to Admission medications   Medication Sig Start Date End Date Taking? Authorizing Provider  acetaminophen  (TYLENOL ) 650 MG CR tablet Take 650 mg by mouth every 8 (eight) hours as needed for pain.   Yes [provider]  albuterol  (VENTOLIN  HFA) 108 (90  Base) MCG/ACT inhaler Inhale 2 puffs into the lungs every 4 (four) hours as needed for wheezing or shortness of breath. 03/31/23  Yes Tapia, Leisa, PA-C  allopurinol  (ZYLOPRIM ) 100 MG tablet Take 2 tablets (200 mg total) by mouth daily. 08/01/23  Yes Tapia, Leisa, PA-C  apixaban  (ELIQUIS ) 2.5 MG TABS tablet Take 2 tablets (5 mg total) by mouth 2 (two) times daily. Patient taking differently: Take 2.5 mg by mouth 2 (two) times daily. 09/12/23  Yes Darron Deatrice LABOR, MD  BREZTRI  AEROSPHERE  160-9-4.8 MCG/ACT AERO inhaler INHALE 2 PUFFS TWICE DAILY 08/19/23  Yes Tapia, Leisa, PA-C  cholecalciferol (VITAMIN D3) 25 MCG (1000 UNIT) tablet Take 1,000 Units by mouth daily.   Yes [provider]  megestrol  (MEGACE ) 20 MG tablet Take 1 tablet (20 mg total) by mouth 2 (two) times daily. 09/01/23  Yes Alexander, Natalie, DO  metoprolol  tartrate (LOPRESSOR ) 25 MG tablet Take 1 tablet (25 mg total) by mouth 2 (two) times daily. 09/12/23 09/11/24 Yes Darron Deatrice LABOR, MD  midodrine  (PROAMATINE ) 5 MG tablet Take 1 tablet (5 mg total) by mouth 3 (three) times daily with meals. 09/23/23  Yes Anner Alm ORN, MD  Multiple Vitamins-Minerals (MULTIVITAMIN WOMEN 50+ PO) Take 1 tablet by mouth daily.   Yes [provider]  ondansetron  (ZOFRAN ) 8 MG tablet Take 1 tablet (8 mg total) by mouth every 8 (eight) hours as needed for nausea or vomiting. 09/07/23  Yes Jacobo Evalene PARAS, MD  ondansetron  (ZOFRAN -ODT) 4 MG disintegrating tablet Take 1 tablet (4 mg total) by mouth every 8 (eight) hours as needed for nausea or vomiting. 08/09/23  Yes Dorinda Drue DASEN, MD  pantoprazole  (PROTONIX ) 40 MG tablet Take 1 tablet (40 mg total) by mouth daily. 09/09/23  Yes Tapia, Leisa, PA-C  potassium chloride  SA (KLOR-CON  M) 20 MEQ tablet Take 1 tablet (20 mEq total) by mouth daily. 09/23/23  Yes Wittenborn, Barnie, NP  prochlorperazine  (COMPAZINE ) 10 MG tablet Take 1 tablet (10 mg total) by mouth every 6 (six) hours as needed for nausea or vomiting. 09/07/23  Yes Jacobo Evalene PARAS, MD  simethicone  (GAS-X) 80 MG chewable tablet Chew 1 tablet (80 mg total) by mouth 2 (two) times a day. 08/02/18 06/17/28 Yes Dorothyann Drivers, MD  triamcinolone  cream (KENALOG ) 0.1 % APPLY TO AFFECTED AREA TWICE DAILY IF NEEDED. NO MORE THAN 1 WEEK AT A TIME, THEN TAKE A BREAK FOR 2 WEEKS. 02/10/22  Yes Bernardo Fend, DO  Blood Pressure Monitoring (ADULT BLOOD PRESSURE CUFF LG) KIT 1 each by Does not apply route daily. 09/01/21    Bernardo Fend, DO  lidocaine -prilocaine  (EMLA ) cream Apply to affected area once 09/07/23   Jacobo Evalene PARAS, MD  magic mouthwash (lidocaine , diphenhydrAMINE, alum & mag hydroxide) suspension Swish and spit 5 mLs 4 (four) times daily as needed for mouth pain. 10/01/23   Babara Call, MD  Sodium Chloride  Flush (NORMAL SALINE FLUSH) 0.9 % SOLN Inject 10 mLs into the vein daily. 08/09/23 10/08/23  Carim, Charles A, PA-C    Scheduled Meds:  sodium chloride    Intravenous Once   allopurinol   200 mg Oral Daily   apixaban   5 mg Oral BID   budesonide -glycopyrrolate -formoterol   2 puff Inhalation BID   Chlorhexidine  Gluconate Cloth  6 each Topical Daily   magic mouthwash w/lidocaine   10 mL Oral QID   megestrol   20 mg Oral BID   midodrine   5 mg Oral TID WC   pantoprazole  (PROTONIX ) IV  40 mg Intravenous Q24H  scopolamine   1 patch Transdermal Q72H   sodium chloride  flush  10-40 mL Intracatheter Q12H   sucralfate   1 g Oral TID WC & HS   Continuous Infusions:  dextrose  40 mL/hr at 10/02/23 1328   PRN Meds: acetaminophen , albuterol , bisacodyl , HYDROmorphone  (DILAUDID ) injection, menthol -cetylpyridinium, metoprolol  tartrate, phenol, prochlorperazine , simethicone , sodium chloride  flush  Allergies:    Allergies  Allergen Reactions   Fentanyl  Swelling    It messes with her heart.    Nsaids     Other Reaction(s): Not available   Oxycodone  Swelling    Social History:   Social History   Socioeconomic History   Marital status: Widowed    Spouse name: Susann   Number of children: 0   Years of education: Not on file   Highest education level: 12th grade  Occupational History   Occupation: Retired  Tobacco Use   Smoking status: Former    Current packs/day: 0.00    Average packs/day: 1.5 packs/day for 20.0 years (30.0 ttl pk-yrs)    Types: Cigarettes    Start date: 11/13/1988    Quit date: 11/13/2008    Years since quitting: 14.8   Smokeless tobacco: Never   Tobacco comments:    smoking  cessation materials not required  Vaping Use   Vaping status: Never Used  Substance and Sexual Activity   Alcohol use: Not Currently   Drug use: No   Sexual activity: Not Currently  Other Topics Concern   Not on file  Social History Narrative   Pt lives alone   Social Drivers of Health   Financial Resource Strain: Low Risk  (12/02/2022)   Overall Financial Resource Strain (CARDIA)    Difficulty of Paying Living Expenses: Not very hard  Food Insecurity: Food Insecurity Present (10/01/2023)   Hunger Vital Sign    Worried About Running Out of Food in the Last Year: Never true    Ran Out of Food in the Last Year: Sometimes true  Transportation Needs: Unmet Transportation Needs (10/01/2023)   PRAPARE - Administrator, Civil Service (Medical): Yes    Lack of Transportation (Non-Medical): No  Physical Activity: Insufficiently Active (12/02/2022)   Exercise Vital Sign    Days of Exercise per Week: 2 days    Minutes of Exercise per Session: 60 min  Stress: No Stress Concern Present (12/02/2022)   [employer] of Occupational Health - Occupational Stress Questionnaire    Feeling of Stress : Not at all  Social Connections: Socially Isolated (10/01/2023)   Social Connection and Isolation Panel    Frequency of Communication with Friends and Family: More than three times a week    Frequency of Social Gatherings with Friends and Family: More than three times a week    Attends Religious Services: Never    Database administrator or Organizations: No    Attends Banker Meetings: Never    Marital Status: Widowed  Intimate Partner Violence: Not At Risk (10/01/2023)   Humiliation, Afraid, Rape, and Kick questionnaire    Fear of Current or Ex-Partner: No    Emotionally Abused: No    Physically Abused: No    Sexually Abused: No    Family History:    Family History  Problem Relation Age of Onset   Diabetes Mother    Lung cancer Father    Diabetes Sister    Congestive  Heart Failure Sister    Glaucoma Sister    Breast cancer Maternal Aunt  ROS:  Please see the history of present illness.   All other ROS reviewed and negative.     Physical Exam/Data: Vitals:   10/02/23 1300 10/02/23 1515 10/02/23 1538 10/02/23 1545  BP: (!) 79/41 (!) 103/58    Pulse: 83 98 (!) 55 (!) 32  Resp: 20 20 20 19   Temp:  98 F (36.7 C)    TempSrc:  Oral    SpO2: (!) 82% 95% 93% 96%    Intake/Output Summary (Last 24 hours) at 10/02/2023 1559 Last data filed at 10/02/2023 1515 Gross per 24 hour  Intake 130 ml  Output --  Net 130 ml      09/27/2023    9:50 AM 09/23/2023   11:08 AM 09/20/2023   10:19 AM  Last 3 Weights  Weight (lbs) 175 lb 173 lb 177 lb  Weight (kg) 79.379 kg 78.472 kg 80.287 kg     VS:  BP (!) 103/58   Pulse (!) 32   Temp 98 F (36.7 C) (Oral)   Resp 19   SpO2 96%  , BMI There is no height or weight on file to calculate BMI. GENERAL:  Ill-appearing.  Mild respiratory distress.  HEENT: Pupils equal round and reactive, fundi not visualized, oral mucosa unremarkable NECK:  No jugular venous distention, waveform within normal limits, carotid upstroke brisk and symmetric, no bruits, no thyromegaly LUNGS:  Clear to auscultation bilaterally HEART: Tachycardic.  Irregularly irregular.  PMI not displaced or sustained,S1 and S2 within normal limits, no S3, no S4, no clicks, no rubs, no murmurs ABD:  Flat, positive bowel sounds normal in frequency in pitch, no bruits, no rebound, no guarding, no midline pulsatile mass, no hepatomegaly, no splenomegaly EXT:  2 plus pulses throughout, no edema, no cyanosis no clubbing SKIN:  No rashes no nodules NEURO:  Cranial nerves II through XII grossly intact, motor grossly intact throughout PSYCH:  Cognitively intact, oriented to person place and time   EKG:  The EKG was personally reviewed and demonstrates: 10/01/2023: Atrial fibrillation.  Rate 106 bpm.  Low voltage. Telemetry:  Telemetry was personally reviewed  and demonstrates:  atrial fibirllation  Relevant CV Studies: Echo 04/13/19:    1. Left ventricular ejection fraction, by estimation, is 55 to 60%. The  left ventricle has normal function. The left ventricle has no regional  wall motion abnormalities. Left ventricular diastolic parameters were  normal.   2. Right ventricular systolic function is normal. The right ventricular  size is normal.   3. The mitral valve is normal in structure. Mild mitral valve  regurgitation. No evidence of mitral stenosis.   4. The aortic valve is normal in structure. Aortic valve regurgitation is  not visualized. No aortic stenosis is present.   5. The inferior vena cava is normal in size with greater than 50%  respiratory variability, suggesting right atrial pressure of 3 mmHg.    Laboratory Data: High Sensitivity Troponin:   Recent Labs  Lab 10/01/23 1356 10/01/23 2122  TROPONINIHS 41* 35*     Chemistry Recent Labs  Lab 09/27/23 0939 10/01/23 1356 10/02/23 0436  NA 126* 135 138  K 3.6 3.5 3.5  CL 102 106 109  CO2 18* 20* 23  GLUCOSE 171* 123* 98  BUN 16 17 15   CREATININE 0.95 0.78 0.61  CALCIUM  8.4* 8.2* 7.8*  MG  --   --  1.6*  GFRNONAA >60 >60 >60  ANIONGAP 6 9 6     Recent Labs  Lab 09/27/23 0939 10/01/23  1356  PROT 6.7 6.1*  ALBUMIN 2.6* 1.9*  AST 23 39  ALT 13 16  ALKPHOS 97 78  BILITOT 0.9 1.4*   Lipids No results for input(s): CHOL, TRIG, HDL, LABVLDL, LDLCALC, CHOLHDL in the last 168 hours.  Hematology Recent Labs  Lab 09/27/23 0939 10/01/23 1356 10/02/23 0436  WBC 7.5 3.2* 2.1*  RBC 2.97* 2.55*  2.50* 2.31*  HGB 9.1* 7.9* 7.1*  HCT 27.2* 23.8* 21.4*  MCV 91.6 93.3 92.6  MCH 30.6 31.0 30.7  MCHC 33.5 33.2 33.2  RDW 13.8 13.6 13.6  PLT 186 114* 80*   Thyroid  No results for input(s): TSH, FREET4 in the last 168 hours.  BNPNo results for input(s): BNP, PROBNP in the last 168 hours.  DDimer No results for input(s): DDIMER in the last  168 hours.  Radiology/Studies:  No results found.   Assessment and Plan:  # Atrial fibrillation with RVR:  Known history of atrial fibrillation on chronic Eliquis .  She became acutely tachycardic.  Currently admitted with mucositis and inability to tolerate orals.  She is pancytopenic.  Recommend transfusing to maintain a hemoglobin greater than 8.  Maintain potassium greater than 4 and magnesium  greater than 2.  Recommend starting IV amiodarone  with a bolus then infusion.  Blood pressure is quite low and I do not think that she will tolerate nodal agents.  Recommend transfer to the ICU in case pressors become needed.  Continue midodrine  for now.  Will repeat echo and check TSH.    Risk Assessment/Risk Scores:       New York  Heart Association (NYHA) Functional Class  CHA2DS2-VASc Score = 5   This indicates a 7.2% annual risk of stroke. The patient's score is based upon: CHF History: 0 HTN History: 1 Diabetes History: 1 Stroke History: 0 Vascular Disease History: 0 Age Score: 2 Gender Score: 1      Total critical care time: 40 minutes. Critical care time was exclusive of separately billable procedures and treating other patients. Critical care was necessary to treat or prevent imminent or life-threatening deterioration. Critical care was time spent personally by me on the following activities: development of treatment plan with patient and/or surrogate as well as nursing, discussions with consultants, evaluation of patient's response to treatment, examination of patient, obtaining history from patient or surrogate, ordering and performing treatments and interventions, ordering and review of laboratory studies, ordering and review of radiographic studies, pulse oximetry and re-evaluation of patient's condition.   For questions or updates, please contact [facility] Please consult [email or web address] for contact info under    Signed, Annabella Scarce, MD  10/02/2023 3:59 PM

## 2023-10-03 ENCOUNTER — Encounter: Payer: Self-pay | Admitting: Internal Medicine

## 2023-10-03 ENCOUNTER — Inpatient Hospital Stay (HOSPITAL_COMMUNITY)
Admit: 2023-10-03 | Discharge: 2023-10-03 | Disposition: A | Discharge status: Home / Self Care | Attending: Cardiovascular Disease | Admitting: Cardiovascular Disease

## 2023-10-03 DIAGNOSIS — I480 Paroxysmal atrial fibrillation: Secondary | ICD-10-CM

## 2023-10-03 DIAGNOSIS — R1310 Dysphagia, unspecified: Secondary | ICD-10-CM

## 2023-10-03 DIAGNOSIS — C259 Malignant neoplasm of pancreas, unspecified: Secondary | ICD-10-CM

## 2023-10-03 DIAGNOSIS — I959 Hypotension, unspecified: Secondary | ICD-10-CM | POA: Diagnosis not present

## 2023-10-03 DIAGNOSIS — I4891 Unspecified atrial fibrillation: Secondary | ICD-10-CM

## 2023-10-03 DIAGNOSIS — Z7189 Other specified counseling: Secondary | ICD-10-CM | POA: Diagnosis not present

## 2023-10-03 DIAGNOSIS — L8990 Pressure ulcer of unspecified site, unspecified stage: Secondary | ICD-10-CM | POA: Insufficient documentation

## 2023-10-03 DIAGNOSIS — R1312 Dysphagia, oropharyngeal phase: Secondary | ICD-10-CM | POA: Diagnosis not present

## 2023-10-03 DIAGNOSIS — Z515 Encounter for palliative care: Secondary | ICD-10-CM | POA: Diagnosis not present

## 2023-10-03 LAB — BASIC METABOLIC PANEL WITH GFR
Anion gap: 7 (ref 5–15)
BUN: 12 mg/dL (ref 8–23)
CO2: 22 mmol/L (ref 22–32)
Calcium: 7.8 mg/dL — ABNORMAL LOW (ref 8.9–10.3)
Chloride: 109 mmol/L (ref 98–111)
Creatinine, Ser: 0.63 mg/dL (ref 0.44–1.00)
GFR, Estimated: >60 mL/min (ref >60)
Glucose, Bld: 127 mg/dL — ABNORMAL HIGH (ref 70–99)
Potassium: 3.2 mmol/L — ABNORMAL LOW (ref 3.5–5.1)
Sodium: 138 mmol/L (ref 135–145)

## 2023-10-03 LAB — TYPE AND SCREEN
ABO/RH(D): A POS
Antibody Screen: NEGATIVE
Unit division: 0

## 2023-10-03 LAB — ECHOCARDIOGRAM COMPLETE
AR max vel: 1.81 cm2
AV Area VTI: 1.59 cm2
AV Area mean vel: 1.7 cm2
AV Mean grad: 3 mmHg
AV Peak grad: 4.9 mmHg
Ao pk vel: 1.11 m/s
Area-P 1/2: 3.19 cm2
Calc EF: 52.2 %
Height: 64 in
MV VTI: 1.75 cm2
S' Lateral: 3.2 cm
Single Plane A2C EF: 48.2 %
Single Plane A4C EF: 52.2 %
Weight: 2818.36 [oz_av]

## 2023-10-03 LAB — GLUCOSE, CAPILLARY
Glucose-Capillary: 130 mg/dL — ABNORMAL HIGH (ref 70–99)
Glucose-Capillary: 116 mg/dL — ABNORMAL HIGH (ref 70–99)
Glucose-Capillary: 104 mg/dL — ABNORMAL HIGH (ref 70–99)
Glucose-Capillary: 111 mg/dL — ABNORMAL HIGH (ref 70–99)
Glucose-Capillary: 109 mg/dL — ABNORMAL HIGH (ref 70–99)

## 2023-10-03 LAB — BPAM RBC
Blood Product Expiration Date: 202510042359
ISSUE DATE / TIME: 202509071535
Unit Type and Rh: 6200

## 2023-10-03 LAB — MAGNESIUM: Magnesium: 1.9 mg/dL (ref 1.7–2.4)

## 2023-10-03 LAB — PHOSPHORUS: Phosphorus: 2.3 mg/dL — ABNORMAL LOW (ref 2.5–4.6)

## 2023-10-03 LAB — PREPARE RBC (CROSSMATCH)

## 2023-10-03 MED ORDER — BOOST / RESOURCE BREEZE PO LIQD CUSTOM
1.0000 | Freq: Three times a day (TID) | ORAL | Status: DC
  Administered 2023-10-04: 1 via ORAL

## 2023-10-03 MED ORDER — POTASSIUM CHLORIDE 10 MEQ/100ML IV SOLN
10.0000 meq | INTRAVENOUS | Status: AC
  Administered 2023-10-03 (×2): 10 meq via INTRAVENOUS
  Filled 2023-10-03 (×2): qty 100

## 2023-10-03 MED ORDER — POTASSIUM PHOSPHATES 15 MMOLE/5ML IV SOLN
21.0000 mmol | Freq: Once | INTRAVENOUS | Status: AC
  Administered 2023-10-03: 21 mmol via INTRAVENOUS
  Filled 2023-10-03: qty 7

## 2023-10-03 MED ORDER — MAGNESIUM SULFATE 2 GM/50ML IV SOLN
2.0000 g | Freq: Once | INTRAVENOUS | Status: AC
  Administered 2023-10-03: 2 g via INTRAVENOUS
  Filled 2023-10-03: qty 50

## 2023-10-03 MED ORDER — ADULT MULTIVITAMIN W/MINERALS CH
1.0000 | ORAL_TABLET | Freq: Every day | ORAL | Status: DC
  Administered 2023-10-04 – 2023-10-07 (×4): 1 via ORAL
  Filled 2023-10-03 (×4): qty 1

## 2023-10-03 MED ORDER — METOPROLOL TARTRATE 25 MG PO TABS
25.0000 mg | ORAL_TABLET | Freq: Two times a day (BID) | ORAL | Status: DC
  Administered 2023-10-03 – 2023-10-05 (×5): 25 mg via ORAL
  Filled 2023-10-03 (×7): qty 1

## 2023-10-03 MED ORDER — APIXABAN 5 MG PO TABS
5.0000 mg | ORAL_TABLET | Freq: Two times a day (BID) | ORAL | Status: DC
  Administered 2023-10-03 – 2023-10-07 (×9): 5 mg via ORAL
  Filled 2023-10-03 (×9): qty 1

## 2023-10-03 NOTE — Consult Note (Signed)
 Consultation Note Date: 10/03/2023   Patient Name: Colleen [name]  DOB: 1946-01-20  MRN: [id number]  Age / Sex: 78 y.o., female  PCP: Leavy Mole, PA-C Referring Physician: Tobie Calix, MD  Reason for Consultation: Establishing goals of care  HPI/Patient Profile: 78 y.o. female  with past medical history of recently diagnosed as stage IV metastatic pancreatic cancer on chemotherapy, gout, orthostatic hypotension, GERD admitted on 10/01/2023 with rapid A-fib with RVR, acute odynophagia suspected mucositis with current chemo.   Clinical Assessment and Goals of Care: I have reviewed medical records including EPIC notes, labs and imaging, received report from RN, assessed the patient.  Colleen [name], [name], is lying quietly in bed.  She appears acutely ill and somewhat frail.  She greets me, making and somewhat keeping eye contact.  She is alert and oriented x 3, able to make her needs known.  There is no family at bedside at this time.  We meet at the bedside to discuss diagnosis prognosis, GOC, EOL wishes, disposition and options.  I introduced Palliative Medicine as specialized medical care for people living with serious illness. It focuses on providing relief from the symptoms and stress of a serious illness. The goal is to improve quality of life for both the patient and the family.  We discussed a brief life review of the patient.  Colleen [name] tells me that she lives independently, her niece [name] is her power of attorney.  She just started chemotherapy 2 weeks ago.  We then focused on their current illness.  Colleen [name] tells me that she wants to continue chemotherapy with the goal of continued treatment as long as possible.  She tells me that she feels that she has a good plan with her trusted oncologist.  We talk about her increased heart rate which has improved.  The natural disease trajectory and expectations  at EOL were discussed.  I attempted to elicit values and goals of care important to the patient.    Advanced directives, concepts specific to code status, artifical feeding and hydration, and rehospitalization were considered and discussed.  We talked about the concept of trick to treatment, but allowing natural passing.  Colleen [name] states that she would currently want attempted resuscitation but states that she would like to think about these options.  I encouraged her to do so, sharing what she does and does not want with her niece/HCPOA, Clarissa.  Palliative Care services outpatient were explained and offered.  We briefly talked about the benefits of outpatient palliative, and encouraged her to consider this support.  Discussed the importance of continued conversation with family and the medical providers regarding overall plan of care and treatment options, ensuring decisions are within the context of the patient's values and GOCs.  Questions and concerns were addressed.  The family was encouraged to call with questions or concerns.  PMT will continue to support holistically.  Conference with attending, bedside nursing staff, speech therapist, transition of care team related to patient condition, needs, goals of care, disposition.  HCPOA  HCPOA -niece, [name] Mt, is legal healthcare power of attorney per patient.    SUMMARY OF RECOMMENDATIONS   Continue to treat the treatable Considering CODE STATUS Would accept continued chemotherapy as offered Considering short-term rehab if qualified   Code Status/Advance Care Planning: Full code - considering options.  Symptom Management:  Currently on Magic mouthwash 4 times per day for stomatitis  Palliative Prophylaxis:  Frequent Pain Assessment and Oral Care  Additional Recommendations (Limitations, Scope, Preferences): Full Scope Treatment  Psycho-social/Spiritual:  Desire for further Chaplaincy support:no Additional  Recommendations: Caregiving  Support/Resources  Prognosis:  Unable to determine, based on outcomes.  6 months or less would not be surprising based on new diagnosis of stage IV pancreatic cancer  Discharge Planning: To Be Determined      Primary Diagnoses: Present on Admission:  Atrial fibrillation with RVR (HCC)   I have reviewed the medical record, interviewed the patient and family, and examined the patient. The following aspects are pertinent.  Past Medical History:  Diagnosis Date   Asthma    Cataract    COPD (chronic obstructive pulmonary disease) (HCC)    GERD (gastroesophageal reflux disease)    Gout    History of pulmonary embolus (PE)    Hyperlipidemia    Hypertension    Osteoarthrosis    Prediabetes    Social History   Socioeconomic History   Marital status: Widowed    Spouse name: Susann   Number of children: 0   Years of education: Not on file   Highest education level: 12th grade  Occupational History   Occupation: Retired  Tobacco Use   Smoking status: Former    Current packs/day: 0.00    Average packs/day: 1.5 packs/day for 20.0 years (30.0 ttl pk-yrs)    Types: Cigarettes    Start date: 11/13/1988    Quit date: 11/13/2008    Years since quitting: 14.8   Smokeless tobacco: Never   Tobacco comments:    smoking cessation materials not required  Vaping Use   Vaping status: Never Used  Substance and Sexual Activity   Alcohol use: Not Currently   Drug use: No   Sexual activity: Not Currently  Other Topics Concern   Not on file  Social History Narrative   Pt lives alone   Social Drivers of Health   Financial Resource Strain: Low Risk  (12/02/2022)   Overall Financial Resource Strain (CARDIA)    Difficulty of Paying Living Expenses: Not very hard  Food Insecurity: Food Insecurity Present (10/01/2023)   Hunger Vital Sign    Worried About Running Out of Food in the Last Year: Never true    Ran Out of Food in the Last Year: Sometimes true   Transportation Needs: Unmet Transportation Needs (10/01/2023)   PRAPARE - Administrator, Civil Service (Medical): Yes    Lack of Transportation (Non-Medical): No  Physical Activity: Insufficiently Active (12/02/2022)   Exercise Vital Sign    Days of Exercise per Week: 2 days    Minutes of Exercise per Session: 60 min  Stress: No Stress Concern Present (12/02/2022)   [employer] of Occupational Health - Occupational Stress Questionnaire    Feeling of Stress : Not at all  Social Connections: Socially Isolated (10/01/2023)   Social Connection and Isolation Panel    Frequency of Communication with Friends and Family: More than three times a week    Frequency of Social Gatherings with Friends and Family: More than three times a week  Attends Religious Services: Never    Active Member of Clubs or Organizations: No    Attends Banker Meetings: Never    Marital Status: Widowed   Family History  Problem Relation Age of Onset   Diabetes Mother    Lung cancer Father    Diabetes Sister    Congestive Heart Failure Sister    Glaucoma Sister    Breast cancer Maternal Aunt    Scheduled Meds:  allopurinol   200 mg Oral Daily   apixaban   5 mg Oral BID   budesonide -glycopyrrolate -formoterol   2 puff Inhalation BID   Chlorhexidine  Gluconate Cloth  6 each Topical Daily   magic mouthwash w/lidocaine   10 mL Oral QID   megestrol   20 mg Oral BID   metoprolol  tartrate  25 mg Oral BID   midodrine   5 mg Oral TID WC   pantoprazole  (PROTONIX ) IV  40 mg Intravenous Q24H   scopolamine   1 patch Transdermal Q72H   sodium chloride  flush  10-40 mL Intracatheter Q12H   sucralfate   1 g Oral TID WC & HS   Continuous Infusions:  dextrose  40 mL/hr at 10/03/23 0700   potassium chloride      potassium PHOSPHATE  IVPB (in mmol)     PRN Meds:.acetaminophen , albuterol , bisacodyl , HYDROmorphone  (DILAUDID ) injection, menthol -cetylpyridinium, metoprolol  tartrate, phenol, prochlorperazine ,  simethicone , sodium chloride  flush Medications Prior to Admission:  Prior to Admission medications   Medication Sig Start Date End Date Taking? Authorizing Provider  acetaminophen  (TYLENOL ) 650 MG CR tablet Take 650 mg by mouth every 8 (eight) hours as needed for pain.   Yes [provider]  albuterol  (VENTOLIN  HFA) 108 (90 Base) MCG/ACT inhaler Inhale 2 puffs into the lungs every 4 (four) hours as needed for wheezing or shortness of breath. 03/31/23  Yes Tapia, Leisa, PA-C  allopurinol  (ZYLOPRIM ) 100 MG tablet Take 2 tablets (200 mg total) by mouth daily. 08/01/23  Yes Tapia, Leisa, PA-C  apixaban  (ELIQUIS ) 2.5 MG TABS tablet Take 2 tablets (5 mg total) by mouth 2 (two) times daily. Patient taking differently: Take 2.5 mg by mouth 2 (two) times daily. 09/12/23  Yes Darron Deatrice LABOR, MD  BREZTRI  AEROSPHERE 160-9-4.8 MCG/ACT AERO inhaler INHALE 2 PUFFS TWICE DAILY 08/19/23  Yes Tapia, Leisa, PA-C  cholecalciferol (VITAMIN D3) 25 MCG (1000 UNIT) tablet Take 1,000 Units by mouth daily.   Yes [provider]  megestrol  (MEGACE ) 20 MG tablet Take 1 tablet (20 mg total) by mouth 2 (two) times daily. 09/01/23  Yes Alexander, Natalie, DO  metoprolol  tartrate (LOPRESSOR ) 25 MG tablet Take 1 tablet (25 mg total) by mouth 2 (two) times daily. 09/12/23 09/11/24 Yes Darron Deatrice LABOR, MD  midodrine  (PROAMATINE ) 5 MG tablet Take 1 tablet (5 mg total) by mouth 3 (three) times daily with meals. 09/23/23  Yes Anner Alm ORN, MD  Multiple Vitamins-Minerals (MULTIVITAMIN WOMEN 50+ PO) Take 1 tablet by mouth daily.   Yes [provider]  ondansetron  (ZOFRAN ) 8 MG tablet Take 1 tablet (8 mg total) by mouth every 8 (eight) hours as needed for nausea or vomiting. 09/07/23  Yes Jacobo Evalene PARAS, MD  ondansetron  (ZOFRAN -ODT) 4 MG disintegrating tablet Take 1 tablet (4 mg total) by mouth every 8 (eight) hours as needed for nausea or vomiting. 08/09/23  Yes Dorinda Drue DASEN, MD  pantoprazole  (PROTONIX ) 40 MG  tablet Take 1 tablet (40 mg total) by mouth daily. 09/09/23  Yes Tapia, Leisa, PA-C  potassium chloride  SA (KLOR-CON  M) 20 MEQ tablet Take 1  tablet (20 mEq total) by mouth daily. 09/23/23  Yes Wittenborn, Barnie, NP  prochlorperazine  (COMPAZINE ) 10 MG tablet Take 1 tablet (10 mg total) by mouth every 6 (six) hours as needed for nausea or vomiting. 09/07/23  Yes Jacobo Evalene PARAS, MD  simethicone  (GAS-X) 80 MG chewable tablet Chew 1 tablet (80 mg total) by mouth 2 (two) times a day. 08/02/18 06/17/28 Yes Dorothyann Drivers, MD  triamcinolone  cream (KENALOG ) 0.1 % APPLY TO AFFECTED AREA TWICE DAILY IF NEEDED. NO MORE THAN 1 WEEK AT A TIME, THEN TAKE A BREAK FOR 2 WEEKS. 02/10/22  Yes Bernardo Fend, DO  Blood Pressure Monitoring (ADULT BLOOD PRESSURE CUFF LG) KIT 1 each by Does not apply route daily. 09/01/21   Bernardo Fend, DO  lidocaine -prilocaine  (EMLA ) cream Apply to affected area once 09/07/23   Jacobo Evalene PARAS, MD  magic mouthwash (lidocaine , diphenhydrAMINE, alum & mag hydroxide) suspension Swish and spit 5 mLs 4 (four) times daily as needed for mouth pain. 10/01/23   Babara Call, MD  Sodium Chloride  Flush (NORMAL SALINE FLUSH) 0.9 % SOLN Inject 10 mLs into the vein daily. 08/09/23 10/08/23  Carim, Carlin LABOR, PA-C   Allergies  Allergen Reactions   Fentanyl  Swelling    It messes with her heart.    Nsaids     Other Reaction(s): Not available   Oxycodone  Swelling   Review of Systems  Unable to perform ROS: Acuity of condition    Physical Exam Vitals and nursing note reviewed.  Constitutional:      General: She is not in acute distress.    Appearance: She is ill-appearing.  HENT:     Mouth/Throat:     Mouth: Mucous membranes are moist.  Cardiovascular:     Rate and Rhythm: Normal rate.  Pulmonary:     Effort: Pulmonary effort is normal. No respiratory distress.  Skin:    General: Skin is warm and dry.  Neurological:     Mental Status: She is alert and oriented to person, place,  and time.  Psychiatric:        Mood and Affect: Mood normal.        Behavior: Behavior normal.     Vital Signs: BP (!) 102/54   Pulse 62   Temp 98.1 F (36.7 C) (Axillary)   Resp 16   Ht 5' 4 (1.626 m)   Wt 79.9 kg   SpO2 98%   BMI 30.24 kg/m  Pain Scale: 0-10 POSS *See Group Information*: 1-Acceptable,Awake and alert Pain Score: Asleep   SpO2: SpO2: 98 % O2 Device:SpO2: 98 % O2 Flow Rate: .   IO: Intake/output summary:  Intake/Output Summary (Last 24 hours) at 10/03/2023 0854 Last data filed at 10/03/2023 0700 Gross per 24 hour  Intake 3440.73 ml  Output --  Net 3440.73 ml    LBM: Last BM Date : 10/02/23 Baseline Weight: Weight: 79.9 kg Most recent weight: Weight: 79.9 kg     Palliative Assessment/Data:     Time In: 1000  Time Out: 1040 Time Total: 40 minutes  Greater than 50%  of this time was spent counseling and coordinating care related to the above assessment and plan.  Signed by: Lorenza LABOR [place], NP   Please contact Palliative Medicine Team phone at [phone] for questions and concerns.  For individual provider: See Tracey

## 2023-10-03 NOTE — Evaluation (Addendum)
 Clinical/Bedside Swallow Evaluation Patient Details  Name: Colleen [name] MRN: [id number] Date of Birth: [birth year]  Today's Date: 10/03/2023 Time: SLP Start Time (ACUTE ONLY): 1205 SLP Stop Time (ACUTE ONLY): 1250 SLP Time Calculation (min) (ACUTE ONLY): 45 min  Past Medical History:  Past Medical History:  Diagnosis Date   Asthma    Cataract    COPD (chronic obstructive pulmonary disease) (HCC)    GERD (gastroesophageal reflux disease)    Gout    History of pulmonary embolus (PE)    Hyperlipidemia    Hypertension    Osteoarthrosis    Prediabetes    Past Surgical History:  Past Surgical History:  Procedure Laterality Date   BACK SURGERY     BREAST BIOPSY Left 04/08/2014   intraductal papilloma 5 mm   COLONOSCOPY WITH PROPOFOL  N/A 04/23/2020   Procedure: COLONOSCOPY WITH PROPOFOL ;  Surgeon: Colleen Keene NOVAK, MD;  Location: [facility] ENDOSCOPY;  Service: Endoscopy;  Laterality: N/A;   ERCP N/A 08/05/2023   Procedure: ERCP, WITH INTERVENTION IF INDICATED;  Surgeon: Colleen Carmine, MD;  Location: [facility] ENDOSCOPY;  Service: Endoscopy;  Laterality: N/A;   ESOPHAGOGASTRODUODENOSCOPY (EGD) WITH PROPOFOL  N/A 04/23/2020   Procedure: ESOPHAGOGASTRODUODENOSCOPY (EGD) WITH PROPOFOL ;  Surgeon: Colleen Keene NOVAK, MD;  Location: [facility] ENDOSCOPY;  Service: Endoscopy;  Laterality: N/A;   GIVENS CAPSULE STUDY N/A 01/28/2022   Procedure: GIVENS CAPSULE STUDY;  Surgeon: Colleen Bi, MD;  Location: [facility] ENDOSCOPY;  Service: Gastroenterology;  Laterality: N/A;   IR IMAGING GUIDED PORT INSERTION  09/12/2023   IR INT EXT BILIARY DRAIN WITH CHOLANGIOGRAM  08/08/2023   IR RADIOLOGIST EVAL & MGMT  09/22/2023   IR REMOVAL BILIARY DRAIN  09/22/2023   LUMBAR LAMINECTOMY     TONSILLECTOMY     HPI:  Pt is a 78 y.o. female  with PMHx notable for Pancreatic Adenocarcinoma, HTN, HLD, COPD, asthma, prediabetes, GERD, OA, gout, and PE.  Per chart notes early on 9/6, pt contacted Oncology office: patient has mouth sores  and can not swallow due to pain. I recommend Magic mouth wash PRN.SABRA  Later in day on 9/6, pt admitted to the ED: She subsequently developed pain in her mouth and difficulty swallowing post Chemotherapy tx.  She states that she is able to swallow some liquids but not much.  She has been unable to eat or take any of her medications.  She denies any difficulty with speaking..  Pt initiated Palliative Chemotherapy on 9/2 per chart.  She had been hospitalized 08/2023 w/ decreased oral intake around that time also but some recovery in her appetite per Oncology office visit note.  No cxr imaging.    Assessment / Plan / Recommendation  Clinical Impression   Pt seen for BSE today. Pt awake, verbal and followed directions w/ verbal cue. A/O x3. Noted pt had increased mucous orally. Oral care given. Pt appeared weak and needed Time and Support w/ all tasks. She endorsed Odynophagia w/ any po.  On RA, afebrile. WBC 2.1  Pt appears to present w/ grossly functional oropharyngeal phase swallowing w/ trial consistencies assessed (no solids given) w/ No oropharyngeal phase dysphagia noted, No neuromuscular deficits noted. Pt consumed po trials w/ No overt, clinical s/s of aspiration during po trials. Pt appears at reduced risk for aspiration following general aspiration precautions.  However, pt does have challenging factors that could impact her oropharyngeal swallowing to include Pain/discomfort(NSG aware) when swallowing- Odynophagia s/p initiation of Chemotherapy txs(Magic Mouthwash is ongoing), fatigue/weakness, missing dentition, and metastatic Cancer dx.  These factors can increase risk for dysphagia as well as decreased oral intake overall.   During po trials, pt consumed consistencies given(thin liquids and purees) w/ no overt coughing, decline in vocal quality, or change in respiratory presentation during/post trials. O2 sats 98%. Oral phase appeared grossly [facility] w/ timely bolus management and control of bolus  propulsion for A-P transfer for swallowing- min hesitation when swallowing 1-2 boluses d/t the anticipated discomfort(it hurts when I swallow). Oral clearing achieved w/ trials. Moistened, cook trials given.  OM Exam appeared grossly [facility] w/ no unilateral weakness noted; reduced lingual ROM d/t the discomfort of ROM secondary to the mouth ulcers. Speech fully intelligible. Pt fed self w/ setup support.   Recommend continue w/ the current Clear liquid diet(thin liquids) w/ upgrade to a puree/soft foods diet as per pt's tolerance moving forward. Foods of comfort and preference. Well-Cut meats and moistened foods could beneficial. Thin liquids -- monitor and pt should help to Hold Cup when drinking. Recommend general aspiration precautions including Small sips/bites slowly. Alternate food/liquids to aid clearing of Esophagus. Pills CRUSHED in Puree for safer, easier swallowing -- this was practiced w/ NSG present during session. It was encouraged now and for D/C to the pt. REFLUX precautions.  Education given on Pills CRUSHED in Puree; food consistencies/prep and easy to eat options; general aspiration precautions to pt. ST services will monitor for any needs while admitted. Oncology following; Dietician f/u recommended for support. Ongoing tx of mouth ulcers w/ ENT consult if indicated. MD/NSG updated, agreed. Palliative Care f/u for support. SLP Visit Diagnosis: Dysphagia, unspecified (R13.10) (in setting of odynophagia post Chemotherapy; missing dentition)    Aspiration Risk   (reduced following general aspiration precautions)    Diet Recommendation   Thin;Dysphagia 1 (puree) (as able to tolerate) = continue w/ the current Clear liquid diet(thin liquids) w/ upgrade to a puree/soft foods diet as per pt's tolerance moving forward. Foods of comfort and preference. Well-Cut meats and moistened foods could beneficial. Thin liquids -- monitor and pt should help to Hold Cup when drinking. Recommend general  aspiration precautions including Small sips/bites slowly. Alternate food/liquids to aid clearing of Esophagus. REFLUX precautions.   Medication Administration: Crushed with puree (for ease)    Other  Recommendations Recommended Consults:  (Palliative Care; Dietician; Oncology/ENT d/t odynophagia and mouth ulcers) Oral Care Recommendations: Oral care QID;Oral care before and after PO;Patient independent with oral care (setup support)     Assistance Recommended at Discharge  Intermittent currently  Functional Status Assessment Patient has had a recent decline in their functional status and demonstrates the ability to make significant improvements in function in a reasonable and predictable amount of time.  Frequency and Duration min 1 x/week  1 week       Prognosis Prognosis for improved oropharyngeal function: Guarded (-Fair) Barriers to Reach Goals: Time post onset;Severity of deficits;Motivation Barriers/Prognosis Comment: in setting of odynophagia post Chemotherapy; missing dentition; pancreatic ca/mets      Swallow Study   General Date of Onset: 10/01/23 HPI: Pt is a 78 y.o. female  with PMHx notable for Pancreatic Adenocarcinoma, HTN, HLD, COPD, asthma, prediabetes, GERD, OA, gout, and PE.  Per chart notes early on 9/6, pt contacted Oncology office: patient has mouth sores and can not swallow due to pain. I recommend Magic mouth wash PRN.SABRA  Later in day on 9/6, pt admitted to the ED: She subsequently developed pain in her mouth and difficulty swallowing post Chemotherapy tx.  She states that she is  able to swallow some liquids but not much.  She has been unable to eat or take any of her medications.  She denies any difficulty with speaking..  Pt initiated Palliative Chemotherapy on 9/2 per chart.  She had been hospitalized 08/2023 w/ decreased oral intake around that time also but some recovery in her appetite per Oncology office visit note.  No cxr imaging. Type of Study: Bedside  Swallow Evaluation Previous Swallow Assessment: none Diet Prior to this Study: Thin liquids (Level 0);Clear liquid diet (d/t odynophagia) Temperature Spikes Noted: No (wbc 2.1) Respiratory Status: Room air History of Recent Intubation: No Behavior/Cognition: Alert;Cooperative;Pleasant mood Oral Cavity Assessment: Excessive secretions (mucousy. ulcers) Oral Care Completed by SLP: Yes Oral Cavity - Dentition: Missing dentition;Poor condition Vision: Functional for self-feeding Self-Feeding Abilities: Able to feed self;Needs assist;Needs set up Patient Positioning: Upright in bed (needed min support) Baseline Vocal Quality: Normal;Low vocal intensity (min) Volitional Cough: Strong Volitional Swallow: Able to elicit    Oral/Motor/Sensory Function Overall Oral Motor/Sensory Function: Within functional limits (though painful during lingual movements)   Ice Chips Ice chips: Within functional limits Presentation: Spoon (6+ trials)   Thin Liquid Thin Liquid: Within functional limits (in setting of odynophagia) Presentation: Self Fed;Straw (5 trials) Other Comments: no overt s/s; odynophagia    Nectar Thick Nectar Thick Liquid: Not tested   Honey Thick Honey Thick Liquid: Not tested   Puree Puree: Within functional limits (in setting of odynophagia) Presentation: Spoon (4 trials) Other Comments: no overt s/s; odynophagia   Solid     Solid: Not tested        Comer Portugal, MS, CCC-SLP Speech Language Pathologist Rehab Services; [facility] [phone] (ascom) Areesha Dehaven 10/03/2023,3:21 PM

## 2023-10-03 NOTE — Discharge Instructions (Signed)
 [place] [facility] Social Services [address] [place], [place] [address] Phone: [phone]  Please follow up with DSS for financial benefits.   Home Health set up with [facility] for [facility] home health services.   To set up services for [employer], Please call [phone]

## 2023-10-03 NOTE — Progress Notes (Signed)
 Patient transferred. AxOx4. VSS. All patient belongings kept at bedside transferred with patient (cane, clothes, purse).

## 2023-10-03 NOTE — Plan of Care (Signed)
  Problem: Education: Goal: Knowledge of General Education information will improve Description: Including pain rating scale, medication(s)/side effects and non-pharmacologic comfort measures Outcome: Progressing   Problem: Clinical Measurements: Goal: Ability to maintain clinical measurements within normal limits will improve Outcome: Progressing Goal: Will remain free from infection Outcome: Progressing Goal: Respiratory complications will improve Outcome: Progressing Goal: Cardiovascular complication will be avoided Outcome: Progressing   Problem: Activity: Goal: Risk for activity intolerance will decrease Outcome: Progressing   Problem: Nutrition: Goal: Adequate nutrition will be maintained Outcome: Progressing   

## 2023-10-03 NOTE — Evaluation (Signed)
 Physical Therapy Evaluation Patient Details Name: Colleen [name] MRN: [id number] DOB: 1945/08/31 Today's Date: 10/03/2023  History of Present Illness  Colleen [name] is a 78 y.o. female with medical history significant of recently diagnosed stage IV metastatic pancreatic cancer on chemotherapy, gout, orthostatic hypotension, GERD, presented with painful swallowing.  Clinical Impression  Patient admitted with the above. PTA, patient lives alone and was modI with use of SPC. Niece assists with getting to/from appointments and getting groceries. Patient presents with weakness, impaired balance, and decreased activity tolerance. Patient required CGA for bed mobility and minA+2 for sit to stand and step pivot transfer bed>BSC>recliner. +2 for line management. Fatigues quickly with minimal activity. Patient will benefit from skilled PT services during acute stay to address listed deficits. Patient will benefit from ongoing therapy at discharge to maximize functional independence and safety.         If plan is discharge home, recommend the following: A lot of help with walking and/or transfers;A little help with bathing/dressing/bathroom;Assistance with cooking/housework;Assist for transportation;Help with stairs or ramp for entrance   Can travel by private vehicle   Yes    Equipment Recommendations Other (comment) (TBD at next venue of care)  Recommendations for Other Services       Functional Status Assessment Patient has had a recent decline in their functional status and demonstrates the ability to make significant improvements in function in a reasonable and predictable amount of time.     Precautions / Restrictions Precautions Precautions: Fall Recall of Precautions/Restrictions: Intact Restrictions Weight Bearing Restrictions Per Provider Order: No      Mobility  Bed Mobility Overal bed mobility: Needs Assistance Bed Mobility: Supine to Sit     Supine to sit: Contact guard           Transfers Overall transfer level: Needs assistance Equipment used: 1 person hand held assist, 2 person hand held assist Transfers: Sit to/from Stand, Bed to chair/wheelchair/BSC Sit to Stand: Min assist   Step pivot transfers: Min assist, +2 safety/equipment       General transfer comment: +2 for line management    Ambulation/Gait                  Stairs            Wheelchair Mobility     Tilt Bed    Modified Rankin (Stroke Patients Only)       Balance                                             Pertinent Vitals/Pain Pain Assessment Pain Assessment: Faces Faces Pain Scale: Hurts whole lot Pain Location: inside of mouth Pain Descriptors / Indicators: Discomfort Pain Intervention(s): Monitored during session    Home Living Family/patient expects to be discharged to:: Private residence Living Arrangements: Alone Available Help at Discharge: Family;Available PRN/intermittently Type of Home: House Home Access: Stairs to enter Entrance Stairs-Rails: Right Entrance Stairs-Number of Steps: 4   Home Layout: One level Home Equipment: Agricultural consultant (2 wheels);Cane - single point;Tub bench      Prior Function Prior Level of Function : Independent/Modified Independent             Mobility Comments: uses SPC for mobility ADLs Comments: Pt is Ind in self care and IADLs. She no longer drives but niece helps get appts and get groceries.  Extremity/Trunk Assessment   Upper Extremity Assessment Upper Extremity Assessment: Generalized weakness    Lower Extremity Assessment Lower Extremity Assessment: Generalized weakness       Communication   Communication Communication: No apparent difficulties    Cognition Arousal: Alert Behavior During Therapy: WFL for tasks assessed/performed   PT - Cognitive impairments: No apparent impairments                         Following commands: Intact        Cueing Cueing Techniques: Verbal cues     General Comments      Exercises     Assessment/Plan    PT Assessment Patient needs continued PT services  PT Problem List Decreased strength;Decreased activity tolerance;Decreased balance;Decreased mobility;Decreased safety awareness;Decreased knowledge of use of DME       PT Treatment Interventions DME instruction;Gait training;Functional mobility training;Therapeutic exercise;Balance training;Therapeutic activities;Neuromuscular re-education;Patient/family education    PT Goals (Current goals can be found in the Care Plan section)  Acute Rehab PT Goals Patient Stated Goal: did not state PT Goal Formulation: With patient Time For Goal Achievement: 10/17/23 Potential to Achieve Goals: Good    Frequency Min 2X/week     Co-evaluation PT/OT/SLP Co-Evaluation/Treatment: Yes Reason for Co-Treatment: Complexity of the patient's impairments (multi-system involvement);To address functional/ADL transfers PT goals addressed during session: Mobility/safety with mobility OT goals addressed during session: ADL's and self-care       AM-PAC PT 6 Clicks Mobility  Outcome Measure Help needed turning from your back to your side while in a flat bed without using bedrails?: A Little Help needed moving from lying on your back to sitting on the side of a flat bed without using bedrails?: A Little Help needed moving to and from a bed to a chair (including a wheelchair)?: A Little Help needed standing up from a chair using your arms (e.g., wheelchair or bedside chair)?: A Lot Help needed to walk in hospital room?: A Lot Help needed climbing 3-5 steps with a railing? : Total 6 Click Score: 14    End of Session   Activity Tolerance: Patient tolerated treatment well Patient left: in chair;with call bell/phone within reach Nurse Communication: Mobility status PT Visit Diagnosis: Unsteadiness on feet (R26.81);Muscle weakness (generalized)  (M62.81);Other abnormalities of gait and mobility (R26.89)    Time: 9068-9046 PT Time Calculation (min) (ACUTE ONLY): 22 min   Charges:   PT Evaluation $PT Eval Moderate Complexity: 1 Mod   PT General Charges $$ ACUTE PT VISIT: 1 Visit         Maryanne Finder, PT, DPT Physical Therapist - [place]  [facility]   Arbor Leer A Eulala Newcombe 10/03/2023, 1:00 PM

## 2023-10-03 NOTE — Progress Notes (Addendum)
 [facility]  - [place] [facility]   PATIENT NAME: Colleen [name]    MR#:  [id number]  DATE OF BIRTH:  August 09, 1945  SUBJECTIVE:  no family at bedside. I spoke with patient's niece Colleen [name] on the phone and updated her patient did not require anymore amiodarone . Her heartrate is 70 to 80s. She is able to tolerated PO meds with applesauce. Does not want to take Lovenox  shots hands eliquis  resumed received one unit of blood transfusion hemoglobin is 8.6   VITALS:  Blood pressure 122/70, pulse 87, temperature 97.7 F (36.5 C), resp. rate 18, height 5' 4 (1.626 m), weight 79.9 kg, SpO2 96%.  PHYSICAL EXAMINATION:   GENERAL:  78 y.o.-year-old patient with acute distress. Weak deconditioned LUNGS: decreased breath sounds bilaterally, no wheezing CARDIOVASCULAR: S1, S2 normal.  ABDOMEN: Soft, nontender, nondistended. Bowel sounds present.  EXTREMITIES: No  edema b/l.    NEUROLOGIC: nonfocal  patient is alert and awake, deconditioned week    LABORATORY PANEL:  CBC Recent Labs  Lab 10/02/23 0436 10/02/23 1955  WBC 2.1*  --   HGB 7.1* 8.9*  HCT 21.4* 26.2*  PLT 80*  --     Chemistries  Recent Labs  Lab 10/01/23 1356 10/02/23 0436 10/03/23 0438  NA 135   < > 138  K 3.5   < > 3.2*  CL 106   < > 109  CO2 20*   < > 22  GLUCOSE 123*   < > 127*  BUN 17   < > 12  CREATININE 0.78   < > 0.63  CALCIUM  8.2*   < > 7.8*  MG  --    < > 1.9  AST 39  --   --   ALT 16  --   --   ALKPHOS 78  --   --   BILITOT 1.4*  --   --    < > = values in this interval not displayed.    Assessment and Plan  Colleen [name] is a 78 y.o. female with medical history significant of recently diagnosed stage IV metastatic pancreatic cancer on chemotherapy, gout, orthostatic hypotension, GERD, presented with painful swallowing.   Patient was diagnosed with stage IV metastatic pancreatic cancer last month and was started on chemotherapy 2 weeks ago and so far she received 2 infusions  with last dose given this passing Tuesday regimen containing gemcitabine  and paclitaxel .  2 days ago patient woke up with severe pain in her mouth and throat, she found that it is very painful to swallow solid food and pills, so for the last few days she has not been able to eat much of the food and since yesterday even swallowing liquid became a problem and she she has been constantly spit out saliva.  Rapid a fib with RVR history of paroxysmal a fib on eliquis  follows with Dr. Darron -- will transfer patient to ICU step down -- [facility] cardiology Dr. Raford consulted-- given soft blood pressure will start patient on amiodarone  drip after loading already received. -- Continue eliquis --pt does not want lovenox  shots and is able to swallow --check mag and replace if low --will give IV mag 2g x1 --replace phosphorus --Echo--EF 55-60% --resumed BB + Eliquis   Acute Odynophagia/suspected mucositis with current chemo -- magic wash with lidocaine  -- clear liquid diet -- PRN pain meds  Severe pancytopenia due to chemo Severe anemia/no active bleeding --will give 1 units BT--pt agreeable ----Dr Colleen [name] aware of pt admission--will f/u  after d.c --hgb 7.9--1 unit --8.6  Hypoglycemia due to poor po intake/FTT --start d5w gtt -- dietitian consulted  Hypotension --cont midodrine   Stage IV pancreatic cancer -- patient follows with Dr. Jacobo. Started palliative chemotherapy two weeks ago  History of COPD -- appears stable -- PRN nebs   palliative care for goals of care  Transfer out of ICU   Procedures: Family communication : spoke with niece Consults : [facility] cardiology CODE STATUS: FULL DVT Prophylaxis :eliquis  Level of care: Med-Surg Status is: inpatient    TOTAL CricitalTIME TAKING CARE OF THIS PATIENT40 minutes.  >50% time spent on counselling and coordination of care  Note: This dictation was prepared with Dragon dictation along with smaller phrase technology. Any  transcriptional errors that result from this process are unintentional.  Colleen [name] M.D    [facility]   CC: Primary care physician; Colleen Mole, PA-C

## 2023-10-03 NOTE — TOC Initial Note (Signed)
 Transition of Care [facility]) - Initial/Assessment Note    Patient Details  Name: Colleen [name] MRN: [id number] Date of Birth: [birth year]  Transition of Care [facility]) CM/SW Contact:    Corrie JINNY Ruts, LCSW Phone Number: 10/03/2023, 3:22 PM  Clinical Narrative:                 Chart reviewed. The patient was admitted for Dysphagia. I spoke with the patient at bedside today. I introduced myself, my role, and reason for consult. The patient reports that she has a PCP. The patient reports that she lives by herself. The patient reports that she was able to complete daily living task independently sometime but her niece would assist her if she could not.   The patient reports that her niece would transport her to medical appointments and that her niece will assist during discharge. The patient reports that she uses [facility] as a pharmacy. The patient reports that she has had HH with [facility] recently but has never been admitted into a SNF. The patient reports that she has a walker and a cane in the home. The patient reports that she has some concerns with paying bills. I informed the patient I will put DSS contact information on AVS and to follow up with them for any financial assistance. The patient verbalized understanding.    TOC will follow the patient until discharge. There are no TOC needs currently.    Barriers to Discharge: Continued Medical Work up   Patient Goals and CMS Choice            Expected Discharge Plan and Services                                              Prior Living Arrangements/Services   Lives with:: Self Patient language and need for interpreter reviewed:: Yes        Need for Family Participation in Patient Care: Yes (Comment)        Activities of Daily Living   ADL Screening (condition at time of admission) Independently performs ADLs?: Yes (appropriate for developmental age) Is the patient deaf or have difficulty hearing?: No Does the patient  have difficulty seeing, even when wearing glasses/contacts?: No Does the patient have difficulty concentrating, remembering, or making decisions?: No  Permission Sought/Granted                  Emotional Assessment Appearance:: Appears stated age Attitude/Demeanor/Rapport: Engaged Affect (typically observed): Calm, Pleasant Orientation: : Oriented to Self, Oriented to Place, Oriented to  Time, Oriented to Situation Alcohol / Substance Use: Not Applicable Psych Involvement: No (comment)  Admission diagnosis:  Dysphagia [R13.10] Hypotension, unspecified hypotension type [I95.9] Dysphagia, unspecified type [R13.10] Patient Active Problem List   Diagnosis Date Noted   Odynophagia 10/03/2023   Pressure injury of skin 10/03/2023   Hypotension 10/02/2023   Dysphasia 10/01/2023   Dysphagia 10/01/2023   Primary pancreatic cancer with metastasis to other site (HCC) 09/07/2023   Malnutrition of moderate degree 09/01/2023   Hyponatremia 08/30/2023   Abnormal LFTs 08/04/2023   Chronic kidney disease, stage 3a (HCC) 08/04/2023   Obesity (BMI 30-39.9) 08/04/2023   Atrial fibrillation, chronic (HCC) 08/04/2023   Pulmonary embolism (HCC) 08/04/2023   Hypokalemia 08/04/2023   Pancreatic mass 08/04/2023   New onset type 2 diabetes mellitus (HCC) 04/04/2023   Acquired trigger finger  of right middle finger 07/24/2021   Acquired trigger finger of right ring finger 07/24/2021   Trigger finger of right hand 06/26/2021   Hidradenitis suppurativa 11/12/2020   Status post gastroplasty-1980s 06/25/2020   CLE (columnar lined esophagus)    Hiatal hernia    Gastric nodule    Angiodysplasia of stomach and duodenum    Cecal polyp    Polyp of descending colon    Polyp of transverse colon    Atrial fibrillation with RVR (HCC) 06/14/2019   Current use of anticoagulant therapy 06/14/2019   Iron deficiency anemia 06/14/2019   Essential (hemorrhagic) thrombocythemia (HCC) 06/14/2019   GERD  (gastroesophageal reflux disease) 04/15/2019   Chronic low back pain 04/15/2019   Right pulmonary embolus (HCC) 04/15/2019   DOE (dyspnea on exertion) 03/13/2019   Atherosclerosis of both carotid arteries 09/20/2018   Spinal stenosis of lumbar region with neurogenic claudication 09/20/2018   Aortic atherosclerosis (HCC) 09/05/2018   At high risk for falls 09/05/2018   Osteopenia 05/18/2017   Stage 3 chronic kidney disease (HCC) 04/21/2017   Morbid obesity (HCC) 04/21/2017   Trigger middle finger of left hand 02/28/2017   Chronic gouty arthropathy without tophi 11/29/2016   Hypergammaglobulinemia 11/29/2016   Osteoarthritis of both knees 11/29/2016   Bilateral primary osteoarthritis of knee 11/29/2016   Allergic rhinitis, seasonal 07/30/2014   Gout 07/30/2014   HLD (hyperlipidemia) 07/30/2014   Osteoarthritis of knee 07/30/2014   Prediabetes 07/30/2014   Asthma, mild persistent 07/30/2014   COPD (chronic obstructive pulmonary disease) (HCC) 07/09/2008   Benign essential HTN 06/22/2006   PCP:  Leavy Mole, PA-C Pharmacy:   [facility] [address]), [place] - [address] [place]) [place] [address] Phone: [phone] Fax: [phone]     Social Drivers of Health (SDOH) Social History: SDOH Screenings   Food Insecurity: Food Insecurity Present (10/01/2023)  Housing: Low Risk  (10/01/2023)  Transportation Needs: Unmet Transportation Needs (10/01/2023)  Utilities: At Risk (10/01/2023)  Alcohol Screen: Low Risk  (12/02/2022)  Depression (PHQ2-9): Low Risk  (09/20/2023)  Financial Resource Strain: Low Risk  (12/02/2022)  Physical Activity: Insufficiently Active (12/02/2022)  Social Connections: Socially Isolated (10/01/2023)  Stress: No Stress Concern Present (12/02/2022)  Tobacco Use: Medium Risk (10/03/2023)  Health Literacy: Adequate Health Literacy (12/02/2022)   SDOH Interventions:     Readmission Risk Interventions     10/03/2023    3:20 PM  Readmission Risk Prevention Plan  Transportation Screening Complete  Medication Review (RN Care Manager) Complete  PCP or Specialist appointment within 3-5 days of discharge Complete  SW Recovery Care/Counseling Consult Complete  Palliative Care Screening Not Applicable  Skilled Nursing Facility Not Applicable

## 2023-10-03 NOTE — Progress Notes (Signed)
 Rounding Note   Patient Name: Colleen [name] Date of Encounter: 10/03/2023  [facility] Cardiologist: Redell Cave, MD   Subjective She continues to be in atrial fibrillation but ventricular rate is more controlled on oral metoprolol .  Scheduled Meds:  allopurinol   200 mg Oral Daily   apixaban   5 mg Oral BID   budesonide -glycopyrrolate -formoterol   2 puff Inhalation BID   Chlorhexidine  Gluconate Cloth  6 each Topical Daily   magic mouthwash w/lidocaine   10 mL Oral QID   megestrol   20 mg Oral BID   metoprolol  tartrate  25 mg Oral BID   midodrine   5 mg Oral TID WC   pantoprazole  (PROTONIX ) IV  40 mg Intravenous Q24H   scopolamine   1 patch Transdermal Q72H   sodium chloride  flush  10-40 mL Intracatheter Q12H   sucralfate   1 g Oral TID WC & HS   Continuous Infusions:  dextrose  40 mL/hr at 10/03/23 1209   potassium PHOSPHATE  IVPB (in mmol) 85 mL/hr at 10/03/23 1209   PRN Meds: acetaminophen , albuterol , bisacodyl , HYDROmorphone  (DILAUDID ) injection, menthol -cetylpyridinium, metoprolol  tartrate, phenol, prochlorperazine , simethicone , sodium chloride  flush   Vital Signs  Vitals:   10/03/23 0900 10/03/23 0931 10/03/23 1000 10/03/23 1111  BP: (!) 105/55 110/66 122/70   Pulse: (!) 51 97 87   Resp: 15 19 18    Temp:    97.7 F (36.5 C)  TempSrc:      SpO2: 97% 97% 96%   Weight:      Height:        Intake/Output Summary (Last 24 hours) at 10/03/2023 1306 Last data filed at 10/03/2023 1209 Gross per 24 hour  Intake 1513.24 ml  Output 150 ml  Net 1363.24 ml      10/02/2023    6:30 PM 09/27/2023    9:50 AM 09/23/2023   11:08 AM  Last 3 Weights  Weight (lbs) 176 lb 2.4 oz 175 lb 173 lb  Weight (kg) 79.9 kg 79.379 kg 78.472 kg      Telemetry Atrial fibrillation with ventricular rate between 70 and 110- Personally Reviewed  ECG   - Personally Reviewed  Physical Exam  GEN: No acute distress.   Neck: No JVD Cardiac: Irregularly irregular, no murmurs, rubs, or  gallops.  Respiratory: Clear to auscultation bilaterally. GI: Soft, nontender, non-distended  MS: No edema; No deformity. Neuro:  Nonfocal  Psych: Normal affect   Labs High Sensitivity Troponin:   Recent Labs  Lab 10/01/23 1356 10/01/23 2122  TROPONINIHS 41* 35*     Chemistry Recent Labs  Lab 09/27/23 0939 10/01/23 1356 10/02/23 0436 10/02/23 1955 10/03/23 0438  NA 126* 135 138  --  138  K 3.6 3.5 3.5  --  3.2*  CL 102 106 109  --  109  CO2 18* 20* 23  --  22  GLUCOSE 171* 123* 98  --  127*  BUN 16 17 15   --  12  CREATININE 0.95 0.78 0.61  --  0.63  CALCIUM  8.4* 8.2* 7.8*  --  7.8*  MG  --   --  1.6* 2.1 1.9  PROT 6.7 6.1*  --   --   --   ALBUMIN 2.6* 1.9*  --   --   --   AST 23 39  --   --   --   ALT 13 16  --   --   --   ALKPHOS 97 78  --   --   --   BILITOT 0.9  1.4*  --   --   --   GFRNONAA >60 >60 >60  --  >60  ANIONGAP 6 9 6   --  7    Lipids No results for input(s): CHOL, TRIG, HDL, LABVLDL, LDLCALC, CHOLHDL in the last 168 hours.  Hematology Recent Labs  Lab 09/27/23 0939 10/01/23 1356 10/02/23 0436 10/02/23 1955  WBC 7.5 3.2* 2.1*  --   RBC 2.97* 2.55*  2.50* 2.31*  --   HGB 9.1* 7.9* 7.1* 8.9*  HCT 27.2* 23.8* 21.4* 26.2*  MCV 91.6 93.3 92.6  --   MCH 30.6 31.0 30.7  --   MCHC 33.5 33.2 33.2  --   RDW 13.8 13.6 13.6  --   PLT 186 114* 80*  --    Thyroid   Recent Labs  Lab 10/02/23 0436  TSH 2.265    BNPNo results for input(s): BNP, PROBNP in the last 168 hours.  DDimer No results for input(s): DDIMER in the last 168 hours.   Radiology  ECHOCARDIOGRAM COMPLETE Result Date: 10/03/2023    ECHOCARDIOGRAM REPORT   Patient Name:   Colleen [name] Date of Exam: 10/03/2023 Medical Rec #:  [id number]      Height:       64.0 in Accession #:    [id number]     Weight:       176.1 lb Date of Birth:  1945/06/30      BSA:          1.854 m Patient Age:    78 years       BP:           106/66 mmHg Patient Gender: F              HR:            118 bpm. Exam Location:  [facility] Procedure: 2D Echo, Cardiac Doppler and Color Doppler (Both Spectral and Color            Flow Doppler were utilized during procedure). Indications:     Atrial Fibrillation I48.91  History:         Patient has prior history of Echocardiogram examinations, most                  recent 04/13/2019. Arrythmias:Atrial Fibrillation.  Sonographer:     Ashley McNeely-Sloane Referring Phys:  [id number] [facility] Diagnosing Phys: Deatrice Cage MD IMPRESSIONS  1. Left ventricular ejection fraction, by estimation, is 55 to 60%. The left ventricle has normal function. The left ventricle has no regional wall motion abnormalities. Left ventricular diastolic parameters are indeterminate.  2. Right ventricular systolic function is normal. The right ventricular size is normal. There is mildly elevated pulmonary artery systolic pressure.  3. Left atrial size was mildly dilated.  4. Right atrial size was mildly dilated.  5. The mitral valve is normal in structure. Mild mitral valve regurgitation. No evidence of mitral stenosis.  6. Tricuspid valve regurgitation is moderate.  7. The aortic valve is normal in structure. Aortic valve regurgitation is not visualized. No aortic stenosis is present.  8. The inferior vena cava is dilated in size with >50% respiratory variability, suggesting right atrial pressure of 8 mmHg. FINDINGS  Left Ventricle: Left ventricular ejection fraction, by estimation, is 55 to 60%. The left ventricle has normal function. The left ventricle has no regional wall motion abnormalities. The left ventricular internal cavity size was normal in size. There is  no left ventricular hypertrophy. Left ventricular diastolic parameters  are indeterminate. Right Ventricle: The right ventricular size is normal. No increase in right ventricular wall thickness. Right ventricular systolic function is normal. There is mildly elevated pulmonary artery systolic pressure. The tricuspid regurgitant  velocity is 3.00  m/s, and with an assumed right atrial pressure of 8 mmHg, the estimated right ventricular systolic pressure is 44.0 mmHg. Left Atrium: Left atrial size was mildly dilated. Right Atrium: Right atrial size was mildly dilated. Pericardium: There is no evidence of pericardial effusion. Mitral Valve: The mitral valve is normal in structure. Mild mitral valve regurgitation. No evidence of mitral valve stenosis. MV peak gradient, 3.5 mmHg. The mean mitral valve gradient is 2.0 mmHg. Tricuspid Valve: The tricuspid valve is normal in structure. Tricuspid valve regurgitation is moderate . No evidence of tricuspid stenosis. Aortic Valve: The aortic valve is normal in structure. Aortic valve regurgitation is not visualized. No aortic stenosis is present. Aortic valve mean gradient measures 3.0 mmHg. Aortic valve peak gradient measures 4.9 mmHg. Aortic valve area, by VTI measures 1.59 cm. Pulmonic Valve: The pulmonic valve was normal in structure. Pulmonic valve regurgitation is mild. No evidence of pulmonic stenosis. Aorta: The aortic root is normal in size and structure. Venous: The inferior vena cava is dilated in size with greater than 50% respiratory variability, suggesting right atrial pressure of 8 mmHg. IAS/Shunts: No atrial level shunt detected by color flow Doppler.  LEFT VENTRICLE PLAX 2D LVIDd:         4.70 cm LVIDs:         3.20 cm LV PW:         1.30 cm LV IVS:        0.90 cm LVOT diam:     1.75 cm LV SV:         35 LV SV Index:   19 LVOT Area:     2.41 cm  LV Volumes (MOD) LV vol d, MOD A2C: 59.9 ml LV vol d, MOD A4C: 67.0 ml LV vol s, MOD A2C: 31.0 ml LV vol s, MOD A4C: 32.0 ml LV SV MOD A2C:     28.9 ml LV SV MOD A4C:     67.0 ml LV SV MOD BP:      35.3 ml RIGHT VENTRICLE RV S prime:     9.36 cm/s LEFT ATRIUM           Index        RIGHT ATRIUM           Index LA diam:      3.60 cm 1.94 cm/m   RA Area:     24.70 cm LA Vol (A4C): 84.8 ml 45.75 ml/m  RA Volume:   82.80 ml  44.67 ml/m   AORTIC VALVE                    PULMONIC VALVE AV Area (Vmax):    1.81 cm     PV Vmax:        1.17 m/s AV Area (Vmean):   1.70 cm     PV Vmean:       80.100 cm/s AV Area (VTI):     1.59 cm     PV VTI:         0.248 m AV Vmax:           111.00 cm/s  PV Peak grad:   5.5 mmHg AV Vmean:          76.800 cm/s  PV Mean grad:  3.0 mmHg AV VTI:            0.220 m      RVOT Peak grad: 2 mmHg AV Peak Grad:      4.9 mmHg AV Mean Grad:      3.0 mmHg LVOT Vmax:         83.60 cm/s LVOT Vmean:        54.400 cm/s LVOT VTI:          0.145 m LVOT/AV VTI ratio: 0.66  AORTA Ao Root diam: 2.90 cm Ao Asc diam:  3.00 cm MITRAL VALVE               TRICUSPID VALVE MV Area (PHT): 3.19 cm    TR Peak grad:   36.0 mmHg MV Area VTI:   1.75 cm    TR Mean grad:   27.0 mmHg MV Peak grad:  3.5 mmHg    TR Vmax:        300.00 cm/s MV Mean grad:  2.0 mmHg    TR Vmean:       255.0 cm/s MV Vmax:       0.93 m/s MV Vmean:      61.4 cm/s   SHUNTS MV Decel Time: 238 msec    Systemic VTI:  0.14 m MV E velocity: 72.10 cm/s  Systemic Diam: 1.75 cm MV A velocity: 47.70 cm/s  Pulmonic VTI:  0.151 m MV E/A ratio:  1.51 Deatrice Cage MD Electronically signed by Deatrice Cage MD Signature Date/Time: 10/03/2023/12:29:40 PM    Final     Cardiac Studies I personally reviewed her echocardiogram which was done today and showed normal LV systolic function, mild biatrial enlargement, mild mitral regurgitation and moderate tricuspid regurgitation  Patient Profile   78 y.o. female with a hx of recently diagnosed stage IV metastatic pancreatic cancer on chemotherapy, gout, orthostatic hypotension, COPD, hypertension, hyperlipidemia, and PAF who is being seen 10/02/2023 for the evaluation of atrial fibrillation with RVR at the request of Dr. Tobie.   Assessment & Plan  1.  Paroxysmal atrial fibrillation with RVR: She was in sinus rhythm in August but now in atrial fibrillation.  Rate control has been difficult due to underlying hypertension but blood pressure  stabilized with midodrine .  She is tolerating metoprolol  25 mg twice daily.  Ventricular rate seems to be reasonably controlled on this.  Continue anticoagulation with Eliquis .  Due to chemotherapy and pancytopenia, we have to monitor her cell counts closely.  If her platelet count goes below 50,000, I recommend holding anticoagulation.  2.  Suspected mucositis on chemotherapy continue supportive care.  3.  Stage IV pancreatic cancer on palliative chemotherapy.  Likely poor prognosis.  4.  Hypotension: Improved with midodrine .     For questions or updates, please contact [facility] Please consult [email or web address] for contact info under     Signed, Deatrice Cage, MD  10/03/2023, 1:06 PM

## 2023-10-03 NOTE — Evaluation (Signed)
 Occupational Therapy Evaluation Patient Details Name: Colleen [name] MRN: [id number] DOB: 02-Dec-1945 Today's Date: 10/03/2023   History of Present Illness   Colleen [name] is a 78 y.o. female with medical history significant of recently diagnosed stage IV metastatic pancreatic cancer on chemotherapy, gout, orthostatic hypotension, GERD, presented with painful swallowing.     Clinical Impressions Patient presenting with decreased Ind in self care,balance, functional mobility/transfers, endurance, and safety awareness. Patient reports being mod I with use of SPC for mobility and living alone. Niece is able to assist her with getting to appts and getting groceries but pt has no other community support at discharge. Pt needing assistance for balance and functional tasks this session secondary to weakness. Pt needing mod A for hygiene and clothing management in standing after toileting needs and balance assistance for transfer to recliner chair. Pt fatigues quickly.   Patient will benefit from acute OT to increase overall independence in the areas of ADLs, functional mobility,and safety awareness in order to safely discharge.     If plan is discharge home, recommend the following:   A lot of help with walking and/or transfers;A lot of help with bathing/dressing/bathroom;Assistance with cooking/housework;Assistance with feeding;Help with stairs or ramp for entrance;Assist for transportation     Functional Status Assessment   Patient has had a recent decline in their functional status and demonstrates the ability to make significant improvements in function in a reasonable and predictable amount of time.     Equipment Recommendations   BSC/3in1      Precautions/Restrictions   Precautions Precautions: Fall     Mobility Bed Mobility Overal bed mobility: Needs Assistance Bed Mobility: Supine to Sit     Supine to sit: Contact guard          Transfers Overall transfer level:  Needs assistance Equipment used: 1 person hand held assist, 2 person hand held assist Transfers: Sit to/from Stand, Bed to chair/wheelchair/BSC Sit to Stand: Min assist     Step pivot transfers: Min assist, +2 safety/equipment            Balance Overall balance assessment: Needs assistance Sitting-balance support: Feet supported, Bilateral upper extremity supported Sitting balance-Leahy Scale: Fair     Standing balance support: During functional activity, Bilateral upper extremity supported Standing balance-Leahy Scale: Poor                             ADL either performed or assessed with clinical judgement   ADL Overall ADL's : Needs assistance/impaired                         Toilet Transfer: +2 for physical assistance;+2 for safety/equipment;Minimal assistance;BSC/3in1;Ambulation   Toileting- Clothing Manipulation and Hygiene: Moderate assistance;Sit to/from stand               Vision Patient Visual Report: No change from baseline              Pertinent Vitals/Pain Pain Assessment Pain Assessment: Faces Faces Pain Scale: Hurts whole lot Pain Location: inside of mouth Pain Descriptors / Indicators: Discomfort Pain Intervention(s): Monitored during session     Extremity/Trunk Assessment Upper Extremity Assessment Upper Extremity Assessment: Generalized weakness   Lower Extremity Assessment Lower Extremity Assessment: Generalized weakness       Communication Communication Communication: No apparent difficulties   Cognition Arousal: Alert Behavior During Therapy: WFL for tasks assessed/performed Cognition: No apparent impairments  Following commands: Intact       Cueing  General Comments   Cueing Techniques: Verbal cues              Home Living Family/patient expects to be discharged to:: Private residence Living Arrangements: Alone Available Help at Discharge:  Family;Available PRN/intermittently Type of Home: House Home Access: Stairs to enter [employer] of Steps: 4 Entrance Stairs-Rails: Right Home Layout: One level     Bathroom Shower/Tub: Tub/shower unit;Sponge bathes at baseline         Home Equipment: Agricultural consultant (2 wheels);Cane - single point;Tub bench          Prior Functioning/Environment Prior Level of Function : Independent/Modified Independent               ADLs Comments: Pt is Ind in self care and IADLs. She no longer drives but niece helps get appts and get groceries.    OT Problem List: Decreased strength;Decreased activity tolerance;Impaired balance (sitting and/or standing);Decreased safety awareness;Decreased knowledge of use of DME or AE;Decreased knowledge of precautions   OT Treatment/Interventions: Self-care/ADL training;Therapeutic exercise;Patient/family education;Balance training;Energy conservation;Therapeutic activities      OT Goals(Current goals can be found in the care plan section)   Acute Rehab OT Goals Patient Stated Goal: to get stronger and decrease pain OT Goal Formulation: With patient Time For Goal Achievement: 10/17/23 Potential to Achieve Goals: Fair ADL Goals Pt Will Perform Grooming: with supervision;standing Pt Will Perform Lower Body Dressing: with supervision;sit to/from stand Pt Will Transfer to Toilet: with supervision;ambulating Pt Will Perform Toileting - Clothing Manipulation and hygiene: with supervision;sit to/from stand   OT Frequency:  Min 2X/week    Co-evaluation PT/OT/SLP Co-Evaluation/Treatment: Yes Reason for Co-Treatment: Complexity of the patient's impairments (multi-system involvement);To address functional/ADL transfers PT goals addressed during session: Mobility/safety with mobility OT goals addressed during session: ADL's and self-care      AM-PAC OT 6 Clicks Daily Activity     Outcome Measure Help from another person eating meals?:  None Help from another person taking care of personal grooming?: A Little Help from another person toileting, which includes using toliet, bedpan, or urinal?: A Lot Help from another person bathing (including washing, rinsing, drying)?: A Lot Help from another person to put on and taking off regular upper body clothing?: A Little Help from another person to put on and taking off regular lower body clothing?: A Lot 6 Click Score: 16   End of Session Nurse Communication: Mobility status  Activity Tolerance: Patient tolerated treatment well Patient left: with call bell/phone within reach;in chair  OT Visit Diagnosis: Unsteadiness on feet (R26.81);Repeated falls (R29.6);Muscle weakness (generalized) (M62.81)                Time: 9069-9047 OT Time Calculation (min): 22 min Charges:  OT General Charges $OT Visit: 1 Visit OT Evaluation $OT Eval Moderate Complexity: 1 [address] MS, OTR/L , CBIS ascom [phone]  10/03/23, 12:51 PM

## 2023-10-04 ENCOUNTER — Inpatient Hospital Stay: Admitting: Oncology

## 2023-10-04 ENCOUNTER — Other Ambulatory Visit: Payer: Self-pay | Admitting: Oncology

## 2023-10-04 ENCOUNTER — Telehealth: Payer: Self-pay | Admitting: *Deleted

## 2023-10-04 ENCOUNTER — Inpatient Hospital Stay

## 2023-10-04 DIAGNOSIS — Z515 Encounter for palliative care: Secondary | ICD-10-CM | POA: Diagnosis not present

## 2023-10-04 DIAGNOSIS — C259 Malignant neoplasm of pancreas, unspecified: Secondary | ICD-10-CM | POA: Diagnosis not present

## 2023-10-04 DIAGNOSIS — Z7189 Other specified counseling: Secondary | ICD-10-CM

## 2023-10-04 DIAGNOSIS — R1312 Dysphagia, oropharyngeal phase: Secondary | ICD-10-CM | POA: Diagnosis not present

## 2023-10-04 LAB — GLUCOSE, CAPILLARY
Glucose-Capillary: 72 mg/dL (ref 70–99)
Glucose-Capillary: 160 mg/dL — ABNORMAL HIGH (ref 70–99)
Glucose-Capillary: 96 mg/dL (ref 70–99)
Glucose-Capillary: 109 mg/dL — ABNORMAL HIGH (ref 70–99)
Glucose-Capillary: 115 mg/dL — ABNORMAL HIGH (ref 70–99)
Glucose-Capillary: 106 mg/dL — ABNORMAL HIGH (ref 70–99)

## 2023-10-04 LAB — MAGNESIUM: Magnesium: 2.1 mg/dL (ref 1.7–2.4)

## 2023-10-04 LAB — PHOSPHORUS: Phosphorus: 2.4 mg/dL — ABNORMAL LOW (ref 2.5–4.6)

## 2023-10-04 MED ORDER — THIAMINE HCL 100 MG PO TABS
100.0000 mg | ORAL_TABLET | Freq: Every day | ORAL | Status: DC
  Administered 2023-10-05 – 2023-10-07 (×3): 100 mg via ORAL
  Filled 2023-10-04 (×6): qty 1

## 2023-10-04 MED ORDER — DEXTROSE 5 % IV SOLN: INTRAVENOUS | Status: AC

## 2023-10-04 MED ORDER — SODIUM CHLORIDE 0.9 % IV SOLN: INTRAVENOUS | Status: DC

## 2023-10-04 MED ORDER — SODIUM CHLORIDE 0.9 % IV BOLUS
500.0000 mL | Freq: Once | INTRAVENOUS | Status: AC
  Administered 2023-10-04: 500 mL via INTRAVENOUS

## 2023-10-04 MED ORDER — VITAMIN C 500 MG PO TABS
500.0000 mg | ORAL_TABLET | Freq: Two times a day (BID) | ORAL | Status: DC
Start: 2023-10-04 — End: 2023-10-07
  Administered 2023-10-04 – 2023-10-07 (×5): 500 mg via ORAL
  Filled 2023-10-04 (×6): qty 1

## 2023-10-04 MED ORDER — DEXTROSE 50 % IV SOLN
1.0000 | Freq: Once | INTRAVENOUS | Status: AC
  Administered 2023-10-04: 50 mL via INTRAVENOUS
  Filled 2023-10-04: qty 50

## 2023-10-04 MED ORDER — ENSURE PLUS HIGH PROTEIN PO LIQD
237.0000 mL | Freq: Three times a day (TID) | ORAL | Status: DC
  Administered 2023-10-04 – 2023-10-07 (×7): 237 mL via ORAL

## 2023-10-04 MED ORDER — OSMOLITE 1.5 CAL PO LIQD: 1000.0000 mL | ORAL | Status: DC

## 2023-10-04 MED ORDER — FREE WATER: 100.0000 mL | Status: DC

## 2023-10-04 MED ORDER — PANTOPRAZOLE SODIUM 40 MG IV SOLR
40.0000 mg | Freq: Two times a day (BID) | INTRAVENOUS | Status: DC
  Administered 2023-10-04 – 2023-10-06 (×5): 40 mg via INTRAVENOUS
  Filled 2023-10-04 (×5): qty 10

## 2023-10-04 NOTE — Plan of Care (Signed)
  Problem: Education: Goal: Knowledge of General Education information will improve Description: Including pain rating scale, medication(s)/side effects and non-pharmacologic comfort measures Outcome: Progressing   Problem: Health Behavior/Discharge Planning: Goal: Ability to manage health-related needs will improve Outcome: Progressing   Problem: Clinical Measurements: Goal: Ability to maintain clinical measurements within normal limits will improve Outcome: Progressing Goal: Will remain free from infection Outcome: Progressing Goal: Diagnostic test results will improve Outcome: Progressing Goal: Respiratory complications will improve Outcome: Progressing Goal: Cardiovascular complication will be avoided Outcome: Progressing   Problem: Coping: Goal: Level of anxiety will decrease Outcome: Progressing   Problem: Elimination: Goal: Will not experience complications related to bowel motility Outcome: Progressing Goal: Will not experience complications related to urinary retention Outcome: Progressing   Problem: Pain Managment: Goal: General experience of comfort will improve and/or be controlled Outcome: Progressing   Problem: Safety: Goal: Ability to remain free from injury will improve Outcome: Progressing   Problem: Skin Integrity: Goal: Risk for impaired skin integrity will decrease Outcome: Progressing   Problem: Activity: Goal: Risk for activity intolerance will decrease Outcome: Not Progressing   Problem: Nutrition: Goal: Adequate nutrition will be maintained Outcome: Not Progressing

## 2023-10-04 NOTE — Progress Notes (Signed)
 SLP Cancellation Note  Patient Details Name: Colleen [name] MRN: [id number] DOB: April 27, 1945   Cancelled treatment:       Reason Eval/Treat Not Completed:  (chart reviewed)  Per chart notes and secure chat w/ MD, NGT placement is pending via IR possibly. Pt continues to have per chart notes pain/discomfort when swallowing d/t the: Acute Odynophagia/suspected Mucositis with current chemo -- magic mouthwash with lidocaine  -- clear liquid diet -- PRN pain meds  ST services recommends continue w/ current diet and tx plan. Noted MD has consulted Palliative Care for GOC, support. ST will f/u next 1-3 days for any ongoing needs; hopefully upgrade of diet when Mucositis has resolved enough to tolerate such. NSG updated.      Comer Portugal, MS, CCC-SLP Speech Language Pathologist Rehab Services; [facility] [phone] (ascom) Gayna Braddy 10/04/2023, 10:11 AM

## 2023-10-04 NOTE — Progress Notes (Signed)
 [facility]  - [place] at [facility]   PATIENT NAME: Colleen [name]    MR#:  [id number]  DATE OF BIRTH:  03-10-1945  SUBJECTIVE:  no family at bedside. I spoke with patient's niece Colleen [name] on the phone and updated her. Patient continues to have significant amount of saliva in her mouth and throat pain. She has been intermittently refusing meds per staff. Taking her cardiac meds with applesauce. Discussed importance of nutrition with temporary NG tube feeding till her mucositis improves. Patient wants to talk with Dr. Jacobo MOCCASIN:  Blood pressure (!) 95/55, pulse 80, temperature 98.5 F (36.9 C), temperature source Oral, resp. rate 18, height 5' 4 (1.626 m), weight 79.2 kg, SpO2 100%.  PHYSICAL EXAMINATION:   GENERAL:  78 y.o.-year-old patient with acute distress. Weak deconditioned LUNGS: decreased breath sounds bilaterally, no wheezing CARDIOVASCULAR: S1, S2 normal.  ABDOMEN: Soft, nontender, nondistended. Bowel sounds present.  EXTREMITIES: No  edema b/l.    NEUROLOGIC: nonfocal  patient is alert and awake, deconditioned week    LABORATORY PANEL:  CBC Recent Labs  Lab 10/02/23 0436 10/02/23 1955  WBC 2.1*  --   HGB 7.1* 8.9*  HCT 21.4* 26.2*  PLT 80*  --     Chemistries  Recent Labs  Lab 10/01/23 1356 10/02/23 0436 10/03/23 0438 10/04/23 0519  NA 135   < > 138  --   K 3.5   < > 3.2*  --   CL 106   < > 109  --   CO2 20*   < > 22  --   GLUCOSE 123*   < > 127*  --   BUN 17   < > 12  --   CREATININE 0.78   < > 0.63  --   CALCIUM  8.2*   < > 7.8*  --   MG  --    < > 1.9 2.1  AST 39  --   --   --   ALT 16  --   --   --   ALKPHOS 78  --   --   --   BILITOT 1.4*  --   --   --    < > = values in this interval not displayed.    Assessment and Plan  Colleen [name] is a 78 y.o. female with medical history significant of recently diagnosed stage IV metastatic pancreatic cancer on chemotherapy, gout, orthostatic hypotension, GERD, presented  with painful swallowing.   Patient was diagnosed with stage IV metastatic pancreatic cancer last month and was started on chemotherapy 2 weeks ago and so far she received 2 infusions with last dose given this passing Tuesday regimen containing gemcitabine  and paclitaxel .  2 days ago patient woke up with severe pain in her mouth and throat, she found that it is very painful to swallow solid food and pills, so for the last few days she has not been able to eat much of the food and since yesterday even swallowing liquid became a problem and she she has been constantly spit out saliva.  Rapid a fib with RVR history of paroxysmal a fib on eliquis  follows with Dr. Darron -- will transfer patient to ICU step down -- [facility] cardiology Dr. Raford consulted-- given soft blood pressure will start patient on amiodarone  drip after loading already received. -- Continue eliquis --pt does not want lovenox  shots and is able to swallow --will give IV mag 2g x1 --replace phosphorus --Echo--EF 55-60% --resumed BB +  Eliquis   Acute Odynophagia/suspected mucositis with current chemo -- magic wash with lidocaine  -- clear liquid diet -- PRN pain meds --Dr Jacobo discussed with patient and now she is agreeable for temporary NG tube feeding till her mucositis improves and oral diet improves. -- IR will place under Fluoro tomorrow (wed). -- Dietitian consulted and will place NG feeding orders  Nutrition Status: Nutrition Problem: Moderate Malnutrition Etiology: cancer and cancer related treatments Signs/Symptoms: mild fat depletion, moderate fat depletion, mild muscle depletion, moderate muscle depletion     Severe pancytopenia due to chemo Severe anemia/no active bleeding --will give 1 units BT--pt agreeable ----Dr Jacobo aware of pt admission--will f/u after d.c --hgb 7.9--1 unit --8.6  Hypoglycemia due to poor po intake/FTT --start d5w gtt-- discontinue once NG tube is started -- dietitian  consulted  Hypotension --cont midodrine   Stage IV pancreatic cancer -- patient follows with Dr. Jacobo. Started palliative chemotherapy two weeks ago  History of COPD -- appears stable -- PRN nebs  Appreciate palliative care for goals of care. Overall long-term prognosis poor     Procedures: Family communication : spoke with niece Consults : [facility] cardiology CODE STATUS: FULL DVT Prophylaxis :eliquis  Level of care: Med-Surg Status is: inpatient -- patient to get NG tube placement under fluoroscopy tomorrow by IR and starting of NG feeding    TOTAL CricitalTIME TAKING CARE OF THIS PATIENT40 minutes.  >50% time spent on counselling and coordination of care  Note: This dictation was prepared with Dragon dictation along with smaller phrase technology. Any transcriptional errors that result from this process are unintentional.  Colleen [name] M.D    [facility]   CC: Primary care physician; Leavy Mole, PA-C

## 2023-10-04 NOTE — Plan of Care (Signed)

## 2023-10-04 NOTE — Progress Notes (Signed)
  IR BRIEF PROGRESS NOTE:  IR was requested for NGT placement under fluoro as patient w/ Hx of gastroplasty, paraesophageal hernia, and mucositis from chemo. Bedside attempt deferred. IR was unable to accommodate today. Will attempt tomorrow. Dr. Jed Team aware, and ok with tentative placement tomorrow.   Electronically Signed: Carlin DELENA Griffon, PA-C 10/04/2023, 5:18 PM

## 2023-10-04 NOTE — Consult Note (Signed)
 [facility]  Telephone:(336) [phone] Fax:(336) [phone]  ID: Colleen [name] OB: 1945-06-26  MR#: [id number]  CSN#:[id number]  Patient Care Team: Leavy Mole, PA-C as PCP - General (Family Medicine) Darliss Rogue, MD as PCP - Cardiology (Cardiology) Dave Agent, MD as Consulting Physician (Rheumatology) Emilio Garrie BRAVO, RN (Inactive) as Registered Nurse Ceola Tereasa PARAS, MD as Referring Physician (Oncology) Therisa Bi, MD as Consulting Physician (Gastroenterology) Maurie Rayfield BIRCH, RN as Oncology Nurse Navigator Jacobo, Evalene PARAS, MD as Consulting Physician (Oncology)  CHIEF COMPLAINT: Stage IV pancreatic cancer, mucositis, atrial fibrillation.  INTERVAL HISTORY: Patient is a 78 year old female who last received chemotherapy using Abraxane  and gemcitabine  1 week ago with the above-stated malignancy.  She developed a severe mucositis has been unable to eat.  She was also noted to have atrial fibrillation and rapid ventricular response.  Patient only mildly improved from admission.  She has no neurologic complaints.  She denies any fevers.  She has no chest pain, shortness of breath, cough, or hemoptysis.  She denies any nausea, vomiting, constipation, or diarrhea.  She has no urinary complaints.  Patient feels generally terrible, but offers no further specific complaints.  REVIEW OF SYSTEMS:   Review of Systems  Constitutional:  Positive for malaise/fatigue. Negative for fever and weight loss.  Respiratory: Negative.  Negative for cough, hemoptysis and shortness of breath.   Cardiovascular: Negative.  Negative for chest pain and leg swelling.  Gastrointestinal: Negative.  Negative for abdominal pain, blood in stool and melena.  Genitourinary: Negative.  Negative for dysuria.  Musculoskeletal: Negative.  Negative for back pain.  Skin: Negative.  Negative for rash.  Neurological:  Positive for weakness. Negative for dizziness, focal weakness and headaches.   Psychiatric/Behavioral: Negative.  The patient is not nervous/anxious.     As per HPI. Otherwise, a complete review of systems is negative.  PAST MEDICAL HISTORY: Past Medical History:  Diagnosis Date   Asthma    Cataract    COPD (chronic obstructive pulmonary disease) (HCC)    GERD (gastroesophageal reflux disease)    Gout    History of pulmonary embolus (PE)    Hyperlipidemia    Hypertension    Osteoarthrosis    Prediabetes     PAST SURGICAL HISTORY: Past Surgical History:  Procedure Laterality Date   BACK SURGERY     BREAST BIOPSY Left 04/08/2014   intraductal papilloma 5 mm   COLONOSCOPY WITH PROPOFOL  N/A 04/23/2020   Procedure: COLONOSCOPY WITH PROPOFOL ;  Surgeon: Janalyn Keene NOVAK, MD;  Location: [facility] ENDOSCOPY;  Service: Endoscopy;  Laterality: N/A;   ERCP N/A 08/05/2023   Procedure: ERCP, WITH INTERVENTION IF INDICATED;  Surgeon: Jinny Carmine, MD;  Location: [facility] ENDOSCOPY;  Service: Endoscopy;  Laterality: N/A;   ESOPHAGOGASTRODUODENOSCOPY (EGD) WITH PROPOFOL  N/A 04/23/2020   Procedure: ESOPHAGOGASTRODUODENOSCOPY (EGD) WITH PROPOFOL ;  Surgeon: Janalyn Keene NOVAK, MD;  Location: [facility] ENDOSCOPY;  Service: Endoscopy;  Laterality: N/A;   GIVENS CAPSULE STUDY N/A 01/28/2022   Procedure: GIVENS CAPSULE STUDY;  Surgeon: Therisa Bi, MD;  Location: [facility] ENDOSCOPY;  Service: Gastroenterology;  Laterality: N/A;   IR IMAGING GUIDED PORT INSERTION  09/12/2023   IR INT EXT BILIARY DRAIN WITH CHOLANGIOGRAM  08/08/2023   IR RADIOLOGIST EVAL & MGMT  09/22/2023   IR REMOVAL BILIARY DRAIN  09/22/2023   LUMBAR LAMINECTOMY     TONSILLECTOMY      FAMILY HISTORY: Family History  Problem Relation Age of Onset   Diabetes Mother    Lung cancer Father  Diabetes Sister    Congestive Heart Failure Sister    Glaucoma Sister    Breast cancer Maternal Aunt     ADVANCED DIRECTIVES (Y/N):  @ADVDIR @  HEALTH MAINTENANCE: Social History   Tobacco Use   Smoking status: Former     Current packs/day: 0.00    Average packs/day: 1.5 packs/day for 20.0 years (30.0 ttl pk-yrs)    Types: Cigarettes    Start date: 11/13/1988    Quit date: 11/13/2008    Years since quitting: 14.8   Smokeless tobacco: Never   Tobacco comments:    smoking cessation materials not required  Vaping Use   Vaping status: Never Used  Substance Use Topics   Alcohol use: Not Currently   Drug use: No     Colonoscopy:  PAP:  Bone density:  Lipid panel:  Allergies  Allergen Reactions   Fentanyl  Swelling    It messes with her heart.    Nsaids     Other Reaction(s): Not available   Oxycodone  Swelling    Current Facility-Administered Medications  Medication Dose Route Frequency Provider Last Rate Last Admin   acetaminophen  (TYLENOL ) tablet 650 mg  650 mg Oral Q8H PRN Laurita Manor T, MD       albuterol  (PROVENTIL ) (2.5 MG/3ML) 0.083% nebulizer solution 2.5 mg  2.5 mg Inhalation Q4H PRN Laurita Manor T, MD       allopurinol  (ZYLOPRIM ) tablet 200 mg  200 mg Oral Daily Laurita Manor T, MD   200 mg at 10/04/23 0944   apixaban  (ELIQUIS ) tablet 5 mg  5 mg Oral BID Patel, Sona, MD   5 mg at 10/04/23 0944   bisacodyl  (DULCOLAX) EC tablet 5 mg  5 mg Oral Daily PRN Laurita Manor T, MD       budesonide -glycopyrrolate -formoterol  (BREZTRI ) 160-9-4.8 MCG/ACT inhaler 2 puff  2 puff Inhalation BID Laurita Manor T, MD   2 puff at 10/02/23 0926   Chlorhexidine  Gluconate Cloth 2 % PADS 6 each  6 each Topical Daily Patel, Sona, MD   6 each at 10/04/23 0945   dextrose  5 % solution   Intravenous Continuous Tobie Calix, MD 40 mL/hr at 10/04/23 0942 New Bag at 10/04/23 0942   feeding supplement (ENSURE PLUS HIGH PROTEIN) liquid 237 mL  237 mL Oral TID BM Patel, Sona, MD   237 mL at 10/04/23 1247   HYDROmorphone  (DILAUDID ) injection 0.5-1 mg  0.5-1 mg Intravenous Q2H PRN Laurita Manor T, MD       magic mouthwash w/lidocaine   10 mL Oral QID Patel, Sona, MD   10 mL at 10/04/23 1247   megestrol  (MEGACE ) tablet 20 mg  20 mg Oral  BID Zhang, Ping T, MD   20 mg at 10/04/23 9055   menthol -cetylpyridinium (CEPACOL) lozenge 3 mg  1 lozenge Oral PRN Laurita Manor T, MD       metoprolol  tartrate (LOPRESSOR ) injection 2.5 mg  2.5 mg Intravenous Q4H PRN Laurita Manor T, MD       metoprolol  tartrate (LOPRESSOR ) tablet 25 mg  25 mg Oral BID Patel, Sona, MD   25 mg at 10/04/23 0944   midodrine  (PROAMATINE ) tablet 5 mg  5 mg Oral TID WC Zhang, Ping T, MD   5 mg at 10/04/23 1208   multivitamin with minerals tablet 1 tablet  1 tablet Oral Daily Patel, Sona, MD   1 tablet at 10/04/23 0944   pantoprazole  (PROTONIX ) injection 40 mg  40 mg Intravenous Q12H Patel, Sona, MD   40 mg  at 10/04/23 0933   phenol (CHLORASEPTIC) mouth spray 1 spray  1 spray Mouth/Throat PRN Laurita Cort DASEN, MD       prochlorperazine  (COMPAZINE ) injection 10 mg  10 mg Intravenous Q6H PRN Laurita Cort DASEN, MD       scopolamine  (TRANSDERM-SCOP) 1 MG/3DAYS 1 mg  1 patch Transdermal Q72H Patel, Sona, MD   1 mg at 10/02/23 1818   simethicone  (MYLICON) chewable tablet 80 mg  80 mg Oral Q6H PRN Laurita Cort T, MD       sodium chloride  flush (NS) 0.9 % injection 10-40 mL  10-40 mL Intracatheter Q12H Tobie Calix, MD   20 mL at 10/04/23 1113   sodium chloride  flush (NS) 0.9 % injection 10-40 mL  10-40 mL Intracatheter PRN Patel, Sona, MD   10 mL at 10/02/23 0927   sucralfate  (CARAFATE ) tablet 1 g  1 g Oral TID WC & HS Laurita Cort T, MD   1 g at 10/04/23 1208   [START ON 10/05/2023] thiamine  (VITAMIN B1) tablet 100 mg  100 mg Oral Daily Tobie Calix, MD        OBJECTIVE: Vitals:   10/04/23 0834 10/04/23 1203  BP: 118/77 (!) 95/55  Pulse: 87 80  Resp: 18 18  Temp: 98.2 F (36.8 C) 98.5 F (36.9 C)  SpO2: 99% 100%     Body mass index is 29.99 kg/m.    ECOG FS:3 - Symptomatic, >50% confined to bed  General: Ill-appearing, no acute distress. Eyes: Pink conjunctiva, anicteric sclera. HEENT: Normocephalic, moist mucous membranes. Lungs: No audible wheezing or coughing. Heart:  Regular rate and rhythm. Abdomen: Soft, nontender, no obvious distention. Musculoskeletal: No edema, cyanosis, or clubbing. Neuro: Alert, answering all questions appropriately. Cranial nerves grossly intact. Skin: No rashes or petechiae noted. Psych: Normal affect.  LAB RESULTS:  Lab Results  Component Value Date   NA 138 10/03/2023   K 3.2 (L) 10/03/2023   CL 109 10/03/2023   CO2 22 10/03/2023   GLUCOSE 127 (H) 10/03/2023   BUN 12 10/03/2023   CREATININE 0.63 10/03/2023   CALCIUM  7.8 (L) 10/03/2023   PROT 6.1 (L) 10/01/2023   ALBUMIN 1.9 (L) 10/01/2023   AST 39 10/01/2023   ALT 16 10/01/2023   ALKPHOS 78 10/01/2023   BILITOT 1.4 (H) 10/01/2023   GFRNONAA >60 10/03/2023   GFRAA 58 (L) 02/28/2020    Lab Results  Component Value Date   WBC 2.1 (L) 10/02/2023   NEUTROABS 5.8 09/27/2023   HGB 8.9 (L) 10/02/2023   HCT 26.2 (L) 10/02/2023   MCV 92.6 10/02/2023   PLT 80 (L) 10/02/2023     STUDIES: ECHOCARDIOGRAM COMPLETE Result Date: 10/03/2023    ECHOCARDIOGRAM REPORT   Patient Name:   Colleen [name] Date of Exam: 10/03/2023 Medical Rec #:  [id number]      Height:       64.0 in Accession #:    [id number]     Weight:       176.1 lb Date of Birth:  07-Dec-1945      BSA:          1.854 m Patient Age:    78 years       BP:           106/66 mmHg Patient Gender: F              HR:           118 bpm. Exam Location:  [facility] Procedure: 2D Echo, Cardiac  Doppler and Color Doppler (Both Spectral and Color            Flow Doppler were utilized during procedure). Indications:     Atrial Fibrillation I48.91  History:         Patient has prior history of Echocardiogram examinations, most                  recent 04/13/2019. Arrythmias:Atrial Fibrillation.  Sonographer:     Ashley McNeely-Sloane Referring Phys:  [id number] [facility] Diagnosing Phys: Deatrice Cage MD IMPRESSIONS  1. Left ventricular ejection fraction, by estimation, is 55 to 60%. The left ventricle has normal function. The left  ventricle has no regional wall motion abnormalities. Left ventricular diastolic parameters are indeterminate.  2. Right ventricular systolic function is normal. The right ventricular size is normal. There is mildly elevated pulmonary artery systolic pressure.  3. Left atrial size was mildly dilated.  4. Right atrial size was mildly dilated.  5. The mitral valve is normal in structure. Mild mitral valve regurgitation. No evidence of mitral stenosis.  6. Tricuspid valve regurgitation is moderate.  7. The aortic valve is normal in structure. Aortic valve regurgitation is not visualized. No aortic stenosis is present.  8. The inferior vena cava is dilated in size with >50% respiratory variability, suggesting right atrial pressure of 8 mmHg. FINDINGS  Left Ventricle: Left ventricular ejection fraction, by estimation, is 55 to 60%. The left ventricle has normal function. The left ventricle has no regional wall motion abnormalities. The left ventricular internal cavity size was normal in size. There is  no left ventricular hypertrophy. Left ventricular diastolic parameters are indeterminate. Right Ventricle: The right ventricular size is normal. No increase in right ventricular wall thickness. Right ventricular systolic function is normal. There is mildly elevated pulmonary artery systolic pressure. The tricuspid regurgitant velocity is 3.00  m/s, and with an assumed right atrial pressure of 8 mmHg, the estimated right ventricular systolic pressure is 44.0 mmHg. Left Atrium: Left atrial size was mildly dilated. Right Atrium: Right atrial size was mildly dilated. Pericardium: There is no evidence of pericardial effusion. Mitral Valve: The mitral valve is normal in structure. Mild mitral valve regurgitation. No evidence of mitral valve stenosis. MV peak gradient, 3.5 mmHg. The mean mitral valve gradient is 2.0 mmHg. Tricuspid Valve: The tricuspid valve is normal in structure. Tricuspid valve regurgitation is moderate . No  evidence of tricuspid stenosis. Aortic Valve: The aortic valve is normal in structure. Aortic valve regurgitation is not visualized. No aortic stenosis is present. Aortic valve mean gradient measures 3.0 mmHg. Aortic valve peak gradient measures 4.9 mmHg. Aortic valve area, by VTI measures 1.59 cm. Pulmonic Valve: The pulmonic valve was normal in structure. Pulmonic valve regurgitation is mild. No evidence of pulmonic stenosis. Aorta: The aortic root is normal in size and structure. Venous: The inferior vena cava is dilated in size with greater than 50% respiratory variability, suggesting right atrial pressure of 8 mmHg. IAS/Shunts: No atrial level shunt detected by color flow Doppler.  LEFT VENTRICLE PLAX 2D LVIDd:         4.70 cm LVIDs:         3.20 cm LV PW:         1.30 cm LV IVS:        0.90 cm LVOT diam:     1.75 cm LV SV:         35 LV SV Index:   19 LVOT Area:     2.41  cm  LV Volumes (MOD) LV vol d, MOD A2C: 59.9 ml LV vol d, MOD A4C: 67.0 ml LV vol s, MOD A2C: 31.0 ml LV vol s, MOD A4C: 32.0 ml LV SV MOD A2C:     28.9 ml LV SV MOD A4C:     67.0 ml LV SV MOD BP:      35.3 ml RIGHT VENTRICLE RV S prime:     9.36 cm/s LEFT ATRIUM           Index        RIGHT ATRIUM           Index LA diam:      3.60 cm 1.94 cm/m   RA Area:     24.70 cm LA Vol (A4C): 84.8 ml 45.75 ml/m  RA Volume:   82.80 ml  44.67 ml/m  AORTIC VALVE                    PULMONIC VALVE AV Area (Vmax):    1.81 cm     PV Vmax:        1.17 m/s AV Area (Vmean):   1.70 cm     PV Vmean:       80.100 cm/s AV Area (VTI):     1.59 cm     PV VTI:         0.248 m AV Vmax:           111.00 cm/s  PV Peak grad:   5.5 mmHg AV Vmean:          76.800 cm/s  PV Mean grad:   3.0 mmHg AV VTI:            0.220 m      RVOT Peak grad: 2 mmHg AV Peak Grad:      4.9 mmHg AV Mean Grad:      3.0 mmHg LVOT Vmax:         83.60 cm/s LVOT Vmean:        54.400 cm/s LVOT VTI:          0.145 m LVOT/AV VTI ratio: 0.66  AORTA Ao Root diam: 2.90 cm Ao Asc diam:  3.00 cm  MITRAL VALVE               TRICUSPID VALVE MV Area (PHT): 3.19 cm    TR Peak grad:   36.0 mmHg MV Area VTI:   1.75 cm    TR Mean grad:   27.0 mmHg MV Peak grad:  3.5 mmHg    TR Vmax:        300.00 cm/s MV Mean grad:  2.0 mmHg    TR Vmean:       255.0 cm/s MV Vmax:       0.93 m/s MV Vmean:      61.4 cm/s   SHUNTS MV Decel Time: 238 msec    Systemic VTI:  0.14 m MV E velocity: 72.10 cm/s  Systemic Diam: 1.75 cm MV A velocity: 47.70 cm/s  Pulmonic VTI:  0.151 m MV E/A ratio:  1.51 Deatrice Cage MD Electronically signed by Deatrice Cage MD Signature Date/Time: 10/03/2023/12:29:40 PM    Final    IR REMOVAL BILIARY DRAIN Result Date: 09/22/2023 CLINICAL DATA:  Patient with a history of metastatic pancreatic cancer with obstruction of the common bile duct status post internal/external biliary drain placed 08/08/2023 in IR. Patient underwent EUS at [facility] with successful placement of stent into the common bile duct.  The biliary drain has been capped since the end of July. EXAM: IR CHOLANGIOGRAM THROUGH EXISTING TUBE IR REMOVE BILIARY DRAIN COMPARISON:  IR internal/external biliary drain 08/08/2023. CONTRAST:  5 mL Isovue -300-administered via the existing percutaneous drain. FLUOROSCOPY TIME:  Radiation exposure index as provided by fluoroscopic device: 33.8 mGy air kerma TECHNIQUE: The patient was positioned supine on the fluoroscopy table. A pre-procedural spot fluoroscopic image was obtained of the left upper quadrant and the existing internal/external biliary catheter. Multiple spot fluoroscopic and radiographic images were obtained following the injection of a small amount of contrast via the existing percutaneous drainage catheter. FINDINGS: Contrast was easily injected into the drain with contrast flowing rapidly through the first part of the catheter and then preferentially flowing into the duodenum via the stented common bile duct. The catheter was cut and an Amplatz wire was inserted. Once the wire was  successfully maneuvered through the catheter the biliary drain was removed over the wire without complication. The patient tolerated the removal well. IMPRESSION: Contrast injection confirmed patency of the common bile duct stent. The biliary drain was successfully removed over an Amplatz wire. The site was covered with gauze/Tegaderm. Electronically Signed   By: Wilkie Lent M.D.   On: 09/22/2023 12:23   IR Radiologist Eval & Mgmt Result Date: 09/22/2023 EXAM: See EPIC note CHIEF COMPLAINT: See EPIC note HISTORY OF PRESENT ILLNESS: See EPIC note REVIEW OF SYSTEMS: See EPIC note PHYSICAL EXAMINATION: See EPIC note ASSESSMENT AND PLAN: See EPIC note Electronically Signed   By: Wilkie Lent M.D.   On: 09/22/2023 12:04   IR IMAGING GUIDED PORT INSERTION Result Date: 09/12/2023 INDICATION: 78 year old female with pancreatic cancer in need of durable venous access for chemotherapy. EXAM: IMPLANTED PORT A CATH PLACEMENT WITH ULTRASOUND AND FLUOROSCOPIC GUIDANCE MEDICATIONS: None. ANESTHESIA/SEDATION: Versed  2 mg IV; Fentanyl  100 mcg IV; administered intravenously by the Radiology nurse Moderate Sedation Time:  18 minutes The patient's vital signs and level of consciousness were continuously monitored during the procedure by the interventional radiology nurse under my direct supervision. FLUOROSCOPY: Radiation exposure index: 0 mGy, reference air kerma COMPLICATIONS: None immediate. PROCEDURE: The right neck and chest was prepped with chlorhexidine , and draped in the usual sterile fashion using maximum barrier technique (cap and mask, sterile gown, sterile gloves, large sterile sheet, hand hygiene and cutaneous antiseptic). Local anesthesia was attained by infiltration with 1% lidocaine  with epinephrine . Ultrasound demonstrated patency of the right internal jugular vein, and this was documented with an image. Under real-time ultrasound guidance, this vein was accessed with a 21 gauge micropuncture needle  and image documentation was performed. A small dermatotomy was made at the access site with an 11 scalpel. A 0.018 wire was advanced into the SVC and the access needle exchanged for a 52F micropuncture vascular sheath. The 0.018 wire was then removed and a 0.035 wire advanced into the IVC. An appropriate location for the subcutaneous reservoir was selected below the clavicle and an incision was made through the skin and underlying soft tissues. The subcutaneous tissues were then dissected using a combination of blunt and sharp surgical technique and a pocket was formed. A Bard Clear Vue single lumen power injectable portacatheter was then tunneled through the subcutaneous tissues from the pocket to the dermatotomy and the port reservoir placed within the subcutaneous pocket. The venous access site was then serially dilated and a peel away vascular sheath placed over the wire. The wire was removed and the port catheter advanced into position under fluoroscopic guidance. The  catheter tip is positioned in the superior cavoatrial junction. This was documented with a spot image. The portacatheter was then tested and found to flush and aspirate well. The port was flushed with saline followed by 100 units/mL heparinized saline. The pocket was then closed in two layers using first subdermal inverted interrupted absorbable sutures followed by a running subcuticular suture. The epidermis was then sealed with Dermabond. The dermatotomy at the venous access site was also closed with Dermabond. IMPRESSION: Successful placement of a right IJ approach Bard Clear Vue port catheter with ultrasound and fluoroscopic guidance. The catheter is ready for use. Electronically Signed   By: Wilkie Lent M.D.   On: 09/12/2023 13:50    ASSESSMENT: Stage IV pancreatic cancer, mucositis, atrial fibrillation.  PLAN:    Stage IV pancreatic cancer: Patient last received chemotherapy with gemcitabine  and Abraxane  on September 27, 2023.   She has been instructed to keep her follow-up appointment for evaluation and treatment in the cancer center on October 11, 2023.  Will likely need to dose reduce her treatments at that time. Mucositis: Likely secondary to chemotherapy.  Patient has minimal p.o. intake and has agreed to NG tube temporarily to improve her nutritional status as her mucositis resolves. Atrial fibrillation: Improved.  Appreciate cardiology input. Pancytopenia: Secondary to chemotherapy.  Repeat CBC in the morning.  Appreciate consult, will follow.  Evalene JINNY Reusing, MD   10/04/2023 1:11 PM

## 2023-10-04 NOTE — Telephone Encounter (Signed)
 Colleen [name] called and said that her was put in the ER on Saturday and would like to talk about what we are doing for future. diagnosed stage IV metastatic pancreatic cancer on chemotherapy, gout, orthostatic hypotension, GERD, presented with painful swallowing .

## 2023-10-04 NOTE — TOC Progression Note (Addendum)
 Transition of Care [facility]) - Progression Note    Patient Details  Name: Colleen [name] MRN: [id number] Date of Birth: 1945/10/16  [facility] of [facility] CM/SW Contact  [place], [place] Phone Number: 10/04/2023, 10:44 AM  Clinical Narrative:     CSW met with patient. Niece, [name], was at bedside. Patient is wanting to discharge home. DME discussed. Patient has a BSC/3in1. She has a shower chair and a RW. Patient declined the need for additional DME. Patient resides alone with the assistance of her niece who transports her to doctors appointments.   Home Health was discussed. Patient advised that she had someone coming in to do OT but could not recall the agency. CSW advised that CSW will search [facility] and set up services with the last provider. Patient agreed. CSW sent secure chat to attending requesting that max HH order is placed.   [employer] also provides a companionship service. CSW will set that up for patient.   CSW spoke with [name], [facility], who confirmed [facility] for patient for max HH. Nursing will begin next week.     Barriers to Discharge: Continued Medical Work up               Expected Discharge Plan and Services                                               Social Drivers of Health (SDOH) Interventions SDOH Screenings   Food Insecurity: Food Insecurity Present (10/01/2023)  Housing: Low Risk  (10/01/2023)  Transportation Needs: Unmet Transportation Needs (10/01/2023)  Utilities: At Risk (10/01/2023)  Alcohol Screen: Low Risk  (12/02/2022)  Depression (PHQ2-9): Low Risk  (09/20/2023)  Financial Resource Strain: Low Risk  (12/02/2022)  Physical Activity: Insufficiently Active (12/02/2022)  Social Connections: Socially Isolated (10/01/2023)  Stress: No Stress Concern Present (12/02/2022)  Tobacco Use: Medium Risk (10/03/2023)  Health Literacy: Adequate Health Literacy (12/02/2022)    Readmission Risk Interventions    10/03/2023    3:20  PM  Readmission Risk Prevention Plan  Transportation Screening Complete  Medication Review (RN Care Manager) Complete  PCP or Specialist appointment within 3-5 days of discharge Complete  SW Recovery Care/Counseling Consult Complete  Palliative Care Screening Not Applicable  Skilled Nursing Facility Not Applicable

## 2023-10-04 NOTE — Progress Notes (Signed)
 Palliative: Mrs. Evgenia, [place], is lying quietly in bed.  She appears acutely ill and somewhat frail.  She greets me, making and somewhat keeping eye contact.  She is alert and oriented, able to make her needs known.  Present today at bedside is her niece/HCPOA, [name] Mt.   We talked about Mrs. Causey's acute health concerns including, but not limited to, stomatitis, stage IV pancreatic cancer and treatment plan, alternative nutrition options.  Mrs. Degraffenreid shares that she understands her cancer is stage IV.  She shares that she has lots more chemotherapy sessions.  When asked about what she would get from these treatments, she is unsure.  We talked about stomatitis and the treatment plan, I encouraged her to swallow some of the Magic mouthwash in order to soothe her throat.  We talked about her GI anatomy and that she would likely not qualify for PEG tube placement.  We talked about temporary nasogastric feeding, what this would look like and how it would be managed.  Mrs. Lockamy states that she wants to have a discussion with her trusted oncologist about what to do next.  Mrs. Sapien stated several times that she did not want to give up.  Reassurance is offered that no one is asking her to give up, we will support her in taking treatments as she so desires.  Both Mrs. Takeshita and [name] state that she had been independent with IADLs prior to this hospital stay and cancer treatment and she prefers to return home. SW updated.   Conference with attending, oncologist, bedside nursing staff, speech therapist, transition of care team related to patient condition, needs, goals of care, disposition.  Plan: Discharging home with home health.  Further discussions with trusted oncologist related to further treatment.  Full scope/full code. Prognosis: Discussed with permission.  Mrs. Wigglesworth did not want to hear prognosis, but [name] did so we shared this privately.  Per Dr. Jacobo, with stage IV  pancreatic cancer without further treatment life expectancy would be approximately 3 months or less, with treatment could possibly be 12 months.  I encouraged Clarissa to look for signs of decreasing physical function as an indicator for prognosis.  50 minutes  Yvonna Brun, NP Palliative medicine team Team phone [phone]

## 2023-10-04 NOTE — Progress Notes (Signed)
 Initial Nutrition Assessment  DOCUMENTATION CODES:   Non-severe (moderate) malnutrition in context of chronic illness  INTERVENTION:   Once NGT in place:  Osmolite 1.5@60ml /hr- Initiate at 29ml/hr and increase by 10ml/hr q 8 hours until goal rate is reached.   Free water  flushes 100ml q4 hours   Regimen provides 2160kcal/day, 90g/day protein and 1653ml/day of free water .   Pt at high refeed risk; recommend monitor potassium, magnesium  and phosphorus labs daily until stable  Thiamine  100mg  daily via tube x 7 days   Vitamin C  250mg  BID via tube   MVI daily via tube   Daily weights   Ensure Plus High Protein po TID, each supplement provides 350 kcal and 20 grams of protein  Magic cup TID with diet advancement, each supplement provides 290 kcal and 9 grams of protein  NUTRITION DIAGNOSIS:   Moderate Malnutrition related to cancer and cancer related treatments as evidenced by mild fat depletion, moderate fat depletion, mild muscle depletion, moderate muscle depletion.  GOAL:   Patient will meet greater than or equal to 90% of their needs  MONITOR:   PO intake, Supplement acceptance, Labs, Weight trends, I & O's, Skin  REASON FOR ASSESSMENT:   Consult Assessment of nutrition requirement/status  ASSESSMENT:   78 y/o female with h/o recently diagnosed stage IV metastatic pancreatic cancer on chemotherapy, Afib, HTN, COPD, gout, HLD, CKD III, GERD, IDA, paraesophageal hernia, Barrett's esophagus, gastric/duodenal angioectasia, s/p gastroplasty (1970's), hidradenitis suppurativa, DM, HLD, PE and IDA who is admitted with Afib with RVR, FTT, pancytopenia and dyshagia secondary to mucositis.  Met with pt and pt's niece in room today. Pt reports that her appetite and oral intake was improving at home until she developed dysphagia and painful swallowing a few days ago. Pt admitted with mucositis and was suctioning thick mucous from her mouth upon RD entering the room. Pt has had  poor oral intake in hospital. Pt reporting today that any acidic foods burn her mouth. Pt also reports that the Boost Breeze is burning her mouth. Pt has been drinking vanilla and chocolate Ensure at home. Per chart, pt appears weight stable for the past two months. RD discussed with pt the importance of adequate nutrition needed to preserve lean muscle; pt will need to maintain a good nutritional status to tolerate chemotherapy.   Pt is agreeable to temporary NGT placement and nutrition support to allow her mouth time to heal. Pt with h/o gastroplasty; pt reports that she never had this reversed but was told that a lot of her staples had come out on their own. Pt reports that she does not have any issues with early satiety and has always been able to eat what she wants. Given history of gastroplasty and paraesophageal hernia, fluoroscopy has been consulted for tube placement. Plan is for fluoroscopy guided NGT placement tomorrow. Pt is actively refeeding secondary to the IV dextrose ; electrolytes being monitored and supplemented as needed. RD will add thiamine . Will continue supplements and vitamins. RD will add vitamin C  to support wound healing.   Medications reviewed and include: allopurinol , megace , midodrine , protonix , scopolamine , carafate , thiamine , 5% dextrose  @100ml /hr   Labs reviewed: K 3.2(L)- 9/8 P 2.4(L), Mg 2.1 wnl- 9/9 Wbc 2.1(L), Hgb 8.9(L), Hct 26.2(L)- 9/7 Cbgs- 109, 96, 160, 72 x 24 hrs   UOP-   NUTRITION - FOCUSED PHYSICAL EXAM:  Flowsheet Row Most Recent Value  Orbital Region No depletion  Upper Arm Region Moderate depletion  Thoracic and Lumbar Region Mild depletion  Buccal Region No depletion  Temple Region Moderate depletion  Clavicle Bone Region Mild depletion  Clavicle and Acromion Bone Region Mild depletion  Scapular Bone Region Mild depletion  Dorsal Hand Mild depletion  Patellar Region Moderate depletion  Anterior Thigh Region No depletion  Posterior  Calf Region No depletion  Edema (RD Assessment) None  Hair Reviewed  Eyes Reviewed  Mouth Reviewed  Skin Reviewed  Nails Reviewed   Diet Order:   Diet Order             Diet clear liquid Room service appropriate? Yes; Fluid consistency: Thin  Diet effective now                  EDUCATION NEEDS:   Education needs have been addressed  Skin:  Skin Assessment: Reviewed RN Assessment (Stage II sacrum and buttocks)  Last BM:  9/9- type 7  Height:   Ht Readings from Last 1 Encounters:  10/02/23 5' 4 (1.626 m)    Weight:   Wt Readings from Last 1 Encounters:  10/04/23 79.2 kg    Ideal Body Weight:  54.5 kg  BMI:  Body mass index is 29.99 kg/m.  Estimated Nutritional Needs:   Kcal:  1800-2100kcal/day  Protein:  90-105g/day  Fluid:  1.5-1.7L/day  Augustin Shams MS, RD, LDN If unable to be reached, please send secure chat to RD inpatient available from 8:00a-4:00p daily

## 2023-10-05 ENCOUNTER — Inpatient Hospital Stay

## 2023-10-05 DIAGNOSIS — R1312 Dysphagia, oropharyngeal phase: Secondary | ICD-10-CM | POA: Diagnosis not present

## 2023-10-05 DIAGNOSIS — K1231 Oral mucositis (ulcerative) due to antineoplastic therapy: Secondary | ICD-10-CM

## 2023-10-05 LAB — GLUCOSE, CAPILLARY
Glucose-Capillary: 100 mg/dL — ABNORMAL HIGH (ref 70–99)
Glucose-Capillary: 104 mg/dL — ABNORMAL HIGH (ref 70–99)
Glucose-Capillary: 105 mg/dL — ABNORMAL HIGH (ref 70–99)
Glucose-Capillary: 110 mg/dL — ABNORMAL HIGH (ref 70–99)
Glucose-Capillary: 74 mg/dL (ref 70–99)
Glucose-Capillary: 89 mg/dL (ref 70–99)
Glucose-Capillary: 94 mg/dL (ref 70–99)

## 2023-10-05 LAB — BASIC METABOLIC PANEL WITH GFR
Anion gap: 7 (ref 5–15)
BUN: 11 mg/dL (ref 8–23)
CO2: 23 mmol/L (ref 22–32)
Calcium: 8.2 mg/dL — ABNORMAL LOW (ref 8.9–10.3)
Chloride: 106 mmol/L (ref 98–111)
Creatinine, Ser: 0.73 mg/dL (ref 0.44–1.00)
GFR, Estimated: >60 mL/min (ref >60)
Glucose, Bld: 114 mg/dL — ABNORMAL HIGH (ref 70–99)
Potassium: 3.6 mmol/L (ref 3.5–5.1)
Sodium: 136 mmol/L (ref 135–145)

## 2023-10-05 LAB — CBC WITH DIFFERENTIAL/PLATELET
Abs Immature Granulocytes: 0.04 K/uL (ref 0.00–0.07)
Basophils Absolute: 0 K/uL (ref 0.0–0.1)
Basophils Relative: 1 %
Eosinophils Absolute: 0 K/uL (ref 0.0–0.5)
Eosinophils Relative: 1 %
HCT: 28.1 % — ABNORMAL LOW (ref 36.0–46.0)
Hemoglobin: 9.5 g/dL — ABNORMAL LOW (ref 12.0–15.0)
Immature Granulocytes: 2 %
Lymphocytes Relative: 53 %
Lymphs Abs: 0.9 K/uL (ref 0.7–4.0)
MCH: 30.2 pg (ref 26.0–34.0)
MCHC: 33.8 g/dL (ref 30.0–36.0)
MCV: 89.2 fL (ref 80.0–100.0)
Monocytes Absolute: 0.4 K/uL (ref 0.1–1.0)
Monocytes Relative: 22 %
Neutro Abs: 0.4 K/uL — CL (ref 1.7–7.7)
Neutrophils Relative %: 21 %
Platelets: 75 K/uL — ABNORMAL LOW (ref 150–400)
RBC: 3.15 MIL/uL — ABNORMAL LOW (ref 3.87–5.11)
RDW: 14.3 % (ref 11.5–15.5)
Smear Review: NORMAL
WBC: 1.8 K/uL — ABNORMAL LOW (ref 4.0–10.5)
nRBC: 1.1 % — ABNORMAL HIGH (ref 0.0–0.2)

## 2023-10-05 LAB — PHOSPHORUS: Phosphorus: 2.3 mg/dL — ABNORMAL LOW (ref 2.5–4.6)

## 2023-10-05 LAB — PATHOLOGIST SMEAR REVIEW

## 2023-10-05 LAB — MAGNESIUM: Magnesium: 2.1 mg/dL (ref 1.7–2.4)

## 2023-10-05 MED ORDER — ZINC SULFATE 220 (50 ZN) MG PO CAPS
220.0000 mg | ORAL_CAPSULE | Freq: Every day | ORAL | Status: DC
  Filled 2023-10-05: qty 1

## 2023-10-05 MED ORDER — FILGRASTIM-AAFI 300 MCG/0.5ML IJ SOSY
300.0000 ug | PREFILLED_SYRINGE | Freq: Every day | INTRAMUSCULAR | Status: DC
  Administered 2023-10-05: 300 ug via SUBCUTANEOUS
  Filled 2023-10-05: qty 0.5

## 2023-10-05 MED ORDER — ZINC SULFATE 220 (50 ZN) MG PO CAPS
220.0000 mg | ORAL_CAPSULE | Freq: Every day | ORAL | Status: DC
  Administered 2023-10-05 – 2023-10-07 (×3): 220 mg via ORAL
  Filled 2023-10-05 (×3): qty 1

## 2023-10-05 NOTE — Progress Notes (Signed)
 Pt received form procedure via bed in stable condition.

## 2023-10-05 NOTE — Progress Notes (Signed)
 [facility]  - [place] at [facility]   PATIENT NAME: Colleen [name]    MR#:  [id number]  DATE OF BIRTH:  [birth year]  SUBJECTIVE:  no family at bedside. I spoke with patient's niece [name] on the phone and updated her. Patient continues to have significant amount of saliva in her mouth and throat pain. She has been intermittently refusing meds per staff. Taking her cardiac meds with applesauce. Discussed importance of nutrition with temporary NG tube feeding till her mucositis improves. Patient wants to talk with Dr. Jacobo MOCCASIN:  Blood pressure 108/72, pulse 91, temperature 98.3 F (36.8 C), resp. rate 16, height 5' 4 (1.626 m), weight 79.6 kg, SpO2 100%.  PHYSICAL EXAMINATION:   GENERAL:  78 y.o.-year-old patient with acute distress. Weak deconditioned LUNGS: decreased breath sounds bilaterally, no wheezing CARDIOVASCULAR: S1, S2 normal.  ABDOMEN: Soft, nontender, nondistended.   EXTREMITIES: No  edema b/l.    NEUROLOGIC: nonfocal  patient is alert and awake, deconditioned week    LABORATORY PANEL:  CBC Recent Labs  Lab 10/05/23 0514  WBC 1.8*  HGB 9.5*  HCT 28.1*  PLT 75*    Chemistries  Recent Labs  Lab 10/01/23 1356 10/02/23 0436 10/05/23 0514  NA 135   < > 136  K 3.5   < > 3.6  CL 106   < > 106  CO2 20*   < > 23  GLUCOSE 123*   < > 114*  BUN 17   < > 11  CREATININE 0.78   < > 0.73  CALCIUM  8.2*   < > 8.2*  MG  --    < > 2.1  AST 39  --   --   ALT 16  --   --   ALKPHOS 78  --   --   BILITOT 1.4*  --   --    < > = values in this interval not displayed.    Assessment and Plan  Colleen [name] is a 78 y.o. female with medical history significant of recently diagnosed stage IV metastatic pancreatic cancer on chemotherapy, gout, orthostatic hypotension, GERD, presented with painful swallowing.   Patient was diagnosed with stage IV metastatic pancreatic cancer last month and was started on chemotherapy 2 weeks ago and so far she  received 2 infusions with last dose given this passing Tuesday regimen containing gemcitabine  and paclitaxel .  2 days ago patient woke up with severe pain in her mouth and throat, she found that it is very painful to swallow solid food and pills, so for the last few days she has not been able to eat much of the food and since yesterday even swallowing liquid became a problem and she she has been constantly spit out saliva.  Rapid a fib with RVR history of paroxysmal a fib on eliquis  follows with Dr. Darron -- now rate controlled --resumed BB + Eliquis   Acute Odynophagia/suspected mucositis with current chemo -- magic wash with lidocaine  -- full liquid diet -- PRN pain meds --ng tube attempted today by IR for nutrition support but unfortunately unsuccessful, anatomy at LES and cardia difficult to navigate, unsafe to advance the tube  Neutropenia Anc 0.4 - filgrastrim ordered by onc  Nutrition Status: Nutrition Problem: Moderate Malnutrition Etiology: cancer and cancer related treatments Signs/Symptoms: mild fat depletion, moderate fat depletion, mild muscle depletion, moderate muscle depletion     Severe pancytopenia due to chemo Severe anemia/no active bleeding --will give 1 units BT--pt agreeable ----Dr Jacobo  aware of pt admission--will f/u after d.c --hgb 7.9--1 unit --8.6  Hypotension --cont midodrine   Stage IV pancreatic cancer -- patient follows with Dr. Jacobo. Started palliative chemotherapy two weeks ago. Plan for outpt f/u 9/16  History of COPD -- appears stable -- PRN nebs  Appreciate palliative care for goals of care. Overall long-term prognosis poor     Procedures: attempt at ng tube placement under fluoro Family communication : spoke with niece 9/10 Consults : [facility] cardiology CODE STATUS: FULL DVT Prophylaxis :eliquis  Level of care: Med-Surg Status is: inpatient -- patient to get NG tube placement under fluoroscopy tomorrow by IR and starting of  NG feeding     Devaughn KATHEE Ban M.D    [facility]   CC: Primary care physician; Leavy Mole, PA-C

## 2023-10-05 NOTE — Progress Notes (Signed)
 Patient sent for procedure via bed in stable condition.

## 2023-10-05 NOTE — Plan of Care (Signed)

## 2023-10-05 NOTE — Progress Notes (Signed)
 Physical Therapy Treatment Patient Details Name: DORENE BRUNI MRN: [id number] DOB: 1945/03/16 Today's Date: 10/05/2023   History of Present Illness ZAFIRO ROUTSON is a 78 y.o. female with medical history significant of recently diagnosed stage IV metastatic pancreatic cancer on chemotherapy, gout, orthostatic hypotension, GERD, presented with painful swallowing.    PT Comments  Pt A&Ox4, agreeable to PT tx. Pt reported soreness in her mouth and overall fatigue during session. Pt able to complete supine BLE exercises prior to bed mobility. Supine > sit with CGA, no physical assistance, increased time/effort. STS from elevated EOB with minAx2 for RW stabilization due to pt tendency to pull from RW to stand despite VC for hand placement. Pt amb ~13ft to/from bathroom with RW and CGA. Additonal STS from toilet with modAx2 and use of grab bar. Able to complete pericare with supervision but maxA for brief management in standing. Pt returned to supine in bed with CGA, all needs within reach. The patient would benefit from further skilled PT intervention to continue to progress towards goals.     If plan is discharge home, recommend the following: A lot of help with walking and/or transfers;A little help with bathing/dressing/bathroom;Assistance with cooking/housework;Assist for transportation;Help with stairs or ramp for entrance   Can travel by private vehicle     Yes  Equipment Recommendations  Other (comment) (TBD)    Recommendations for Other Services       Precautions / Restrictions Precautions Precautions: Fall Recall of Precautions/Restrictions: Intact Restrictions Weight Bearing Restrictions Per Provider Order: No     Mobility  Bed Mobility Overal bed mobility: Needs Assistance Bed Mobility: Supine to Sit, Sit to Supine     Supine to sit: HOB elevated, Contact guard Sit to supine: Contact guard assist   General bed mobility comments: no physical assist for bed mobility, inc  time/effort    Transfers Overall transfer level: Needs assistance Equipment used: Rolling walker (2 wheels) Transfers: Sit to/from Stand, Bed to chair/wheelchair/BSC Sit to Stand: Min assist, +2 physical assistance, +2 safety/equipment, Mod assist, From elevated surface           General transfer comment: STS from elevated EOB with minAx2 for RW stabilization. ModAx2 STS from toilet with use of grab bar    Ambulation/Gait Ambulation/Gait assistance: Contact guard assist Gait Distance (Feet): 24 Feet Assistive device: Rolling walker (2 wheels) Gait Pattern/deviations: Step-through pattern, Trunk flexed, Wide base of support Gait velocity: decreased     General Gait Details: no LOB, VC to maintain BOS within RW frame, slowed cadence and wide BOS   Stairs             Wheelchair Mobility     Tilt Bed    Modified Rankin (Stroke Patients Only)       Balance Overall balance assessment: Needs assistance Sitting-balance support: Feet supported Sitting balance-Leahy Scale: Fair     Standing balance support: Bilateral upper extremity supported, No upper extremity supported Standing balance-Leahy Scale: Poor Standing balance comment: heavy UE use on RW in standing                            Communication Communication Communication: No apparent difficulties  Cognition Arousal: Alert Behavior During Therapy: WFL for tasks assessed/performed   PT - Cognitive impairments: No apparent impairments                       PT - Cognition Comments: A&Ox4 Following  commands: Intact      Cueing Cueing Techniques: Verbal cues  Exercises Total Joint Exercises Ankle Circles/Pumps: AROM, Both, 10 reps, Supine Short Arc Quad: AROM, Both, 10 reps, Supine Heel Slides: AROM, Both, 5 reps, Supine    General Comments        Pertinent Vitals/Pain Pain Assessment Pain Assessment: Faces Faces Pain Scale: Hurts even more Pain Location: mouth Pain  Descriptors / Indicators: Grimacing Pain Intervention(s): Limited activity within patient's tolerance, Monitored during session, Repositioned    Home Living Family/patient expects to be discharged to:: Private residence                        Prior Function            PT Goals (current goals can now be found in the care plan section) Progress towards PT goals: Progressing toward goals    Frequency    Min 2X/week      PT Plan      Co-evaluation              AM-PAC PT 6 Clicks Mobility   Outcome Measure  Help needed turning from your back to your side while in a flat bed without using bedrails?: A Little Help needed moving from lying on your back to sitting on the side of a flat bed without using bedrails?: A Little Help needed moving to and from a bed to a chair (including a wheelchair)?: A Little Help needed standing up from a chair using your arms (e.g., wheelchair or bedside chair)?: A Lot Help needed to walk in hospital room?: A Little Help needed climbing 3-5 steps with a railing? : Total 6 Click Score: 15    End of Session Equipment Utilized During Treatment: Gait belt Activity Tolerance: Patient limited by fatigue Patient left: in bed;with call bell/phone within reach;with bed alarm set (OT in room) Nurse Communication: Mobility status PT Visit Diagnosis: Unsteadiness on feet (R26.81);Muscle weakness (generalized) (M62.81);Other abnormalities of gait and mobility (R26.89)     Time: 8879-8854 PT Time Calculation (min) (ACUTE ONLY): 25 min  Charges:    $Gait Training: 8-22 mins $Therapeutic Activity: 8-22 mins PT General Charges $$ ACUTE PT VISIT: 1 Visit                     Janell Axe, SPT

## 2023-10-05 NOTE — Progress Notes (Addendum)
 Nutrition Follow-up  DOCUMENTATION CODES:   Non-severe (moderate) malnutrition in context of chronic illness  INTERVENTION:   IR unable to place NGT   500 mg vitamin C  BID  220 mg zinc  sulfate daily    MVI daily    Daily weights    Ensure Plus High Protein po TID, each supplement provides 350 kcal and 20 grams of protein   Magic cup TID with diet advancement, each supplement provides 290 kcal and 9 grams of protein   NUTRITION DIAGNOSIS:   Moderate Malnutrition related to cancer and cancer related treatments as evidenced by mild fat depletion, moderate fat depletion, mild muscle depletion, moderate muscle depletion.  Ongoing  GOAL:   Patient will meet greater than or equal to 90% of their needs  Progressing   MONITOR:   PO intake, Supplement acceptance, Labs, Weight trends, I & O's, Skin  REASON FOR ASSESSMENT:   Consult Assessment of nutrition requirement/status  ASSESSMENT:   78 y/o female with h/o recently diagnosed stage IV metastatic pancreatic cancer on chemotherapy, Afib, HTN, COPD, gout, HLD, CKD III, GERD, IDA, paraesophageal hernia, Barrett's esophagus, gastric/duodenal angioectasia, s/p gastroplasty (1970's), hidradenitis suppurativa, DM, HLD, PE and IDA who is admitted with Afib with RVR, FTT, pancytopenia and dyshagia secondary to mucositis.  Reviewed I/O's: +239 ml x 24 hours and +4.7 L since admission  Pt with stage IV pancreatic cancer; last chemo (gemcitabine  and Abraxane ) on 09/27/23.   Plan for NGT placement today secondary to poor oral intake as a result of mucositis.   Case discussed with RN, who confirmed plan for NGT today. Pt currently down for procedure. Discussed TF orders and plan to start at low rate and advance slowly secondary to refeeding risk. ADDENDUM: IR unable to place NGT on pt.   Pt remains on a clear liquid diet with minimal intake.   Medications reviewed and include vitamin C , megace , magic mouthwash, and protonix .   Labs  reviewed: Phos: 2.4, K and Mg WDL. CBGS: 89-115 (inpatient orders for glycemic control are none).    Diet Order:   Diet Order             Diet clear liquid Room service appropriate? Yes; Fluid consistency: Thin  Diet effective now                   EDUCATION NEEDS:   Education needs have been addressed  Skin:  Skin Assessment: Skin Integrity Issues: Skin Integrity Issues:: Stage II Stage II: sacrum, rt buttocks  Last BM:  10/05/23 (type 7)  Height:   Ht Readings from Last 1 Encounters:  10/02/23 5' 4 (1.626 m)    Weight:   Wt Readings from Last 1 Encounters:  10/05/23 79.6 kg    Ideal Body Weight:  54.5 kg  BMI:  Body mass index is 30.12 kg/m.  Estimated Nutritional Needs:   Kcal:  1800-2100kcal/day  Protein:  90-105g/day  Fluid:  1.5-1.7L/day    Margery ORN, RD, LDN, CDCES Registered Dietitian III Certified Diabetes Care and Education Specialist If unable to reach this RD, please use RD Inpatient group chat on secure chat between hours of 8am-4 pm daily

## 2023-10-05 NOTE — Progress Notes (Signed)
 Occupational Therapy Treatment Patient Details Name: ANSELMA HERBEL MRN: [id number] DOB: 1945/02/06 Today's Date: 10/05/2023   History of present illness Colleen [name] is a 78 y.o. female with medical history significant of recently diagnosed stage IV metastatic pancreatic cancer on chemotherapy, gout, orthostatic hypotension, GERD, presented with painful swallowing.   OT comments  Pt seen for OT/PT treatment on this date. Upon arrival to room pt semi supine at bed level, agreeable to tx. Pt requires CGA for bed mobility with good use of bed rails. Pt STS from slightly elevated bed height with MINA for RW management due to pt often pulling RW towards self during lift off. Pt required setupA for seated grooming tasks, MODA+2 for STS from standard toilet height, MAXA for pericare in standing, returned to bed to rest. Pt reported 8/10 exertion post BM and mobility. Pt making fair progress toward goals, continues to have limited activity tolerance, will continue to follow POC. Discharge recommendation remains appropriate.        If plan is discharge home, recommend the following:  A lot of help with walking and/or transfers;A lot of help with bathing/dressing/bathroom;Assistance with cooking/housework;Assistance with feeding;Help with stairs or ramp for entrance;Assist for transportation   Equipment Recommendations  BSC/3in1          Precautions / Restrictions Precautions Precautions: Fall Recall of Precautions/Restrictions: Intact Restrictions Weight Bearing Restrictions Per Provider Order: No       Mobility Bed Mobility Overal bed mobility: Needs Assistance Bed Mobility: Supine to Sit, Sit to Supine     Supine to sit: Contact guard Sit to supine: Contact guard assist   General bed mobility comments: Used bed rails, extra time to complete, no physical assistance    Transfers Overall transfer level: Needs assistance Equipment used: Rolling walker (2 wheels) Transfers: Sit  to/from Stand Sit to Stand: Min assist, From elevated surface           General transfer comment: MODA+2 STS from the low toilet height with use of grab bar, heavy cues for hand placement and technique throughout transfers with fair carryover     Balance Overall balance assessment: Needs assistance Sitting-balance support: Feet supported, Bilateral upper extremity supported Sitting balance-Leahy Scale: Fair Sitting balance - Comments: Steady reaching within BOS   Standing balance support: During functional activity, Bilateral upper extremity supported Standing balance-Leahy Scale: Poor Standing balance comment: reliant on RW in standing                           ADL either performed or assessed with clinical judgement   ADL Overall ADL's : Needs assistance/impaired     Grooming: Wash/dry face;Wash/dry hands;Set up;Sitting               Lower Body Dressing: Maximal assistance;Sit to/from stand   Toilet Transfer: Rolling walker (2 wheels);Regular Toilet;+2 for safety/equipment;Contact guard assist   Toileting- Clothing Manipulation and Hygiene: Maximal assistance;Sit to/from stand       Functional mobility during ADLs: Contact guard assist;Rolling walker (2 wheels);Cueing for safety       Communication Communication Communication: No apparent difficulties   Cognition Arousal: Alert Behavior During Therapy: WFL for tasks assessed/performed Cognition: No apparent impairments                               Following commands: Intact        Cueing   Cueing Techniques: Verbal  cues  Exercises Exercises: Other exercises Other Exercises Other Exercises: Edu: Role of OT session, benefits of OOB mobility            Pertinent Vitals/ Pain       Pain Assessment Pain Assessment: Faces Faces Pain Scale: Hurts even more Pain Location: Generalized Pain Descriptors / Indicators: Discomfort Pain Intervention(s): Limited activity within  patient's tolerance, Monitored during session, Repositioned                   Frequency  Min 2X/week        Progress Toward Goals  OT Goals(current goals can now be found in the care plan section)  Progress towards OT goals: Progressing toward goals  Acute Rehab OT Goals OT Goal Formulation: With patient Time For Goal Achievement: 10/17/23 Potential to Achieve Goals: Fair ADL Goals Pt Will Perform Grooming: with supervision;standing Pt Will Perform Lower Body Dressing: with supervision;sit to/from stand Pt Will Transfer to Toilet: with supervision;ambulating Pt Will Perform Toileting - Clothing Manipulation and hygiene: with supervision;sit to/from stand   AM-PAC OT 6 Clicks Daily Activity     Outcome Measure   Help from another person eating meals?: None Help from another person taking care of personal grooming?: A Little Help from another person toileting, which includes using toliet, bedpan, or urinal?: A Lot Help from another person bathing (including washing, rinsing, drying)?: A Lot Help from another person to put on and taking off regular upper body clothing?: A Little Help from another person to put on and taking off regular lower body clothing?: A Lot 6 Click Score: 16    End of Session Equipment Utilized During Treatment: Gait belt;Rolling walker (2 wheels)  OT Visit Diagnosis: Unsteadiness on feet (R26.81);Repeated falls (R29.6);Muscle weakness (generalized) (M62.81)   Activity Tolerance Patient tolerated treatment well   Patient Left with call bell/phone within reach;in bed;with bed alarm set   Nurse Communication Mobility status        Time: 8874-8850 OT Time Calculation (min): 24 min  Charges: OT General Charges $OT Visit: 1 Visit OT Treatments $Self Care/Home Management : 8-22 mins  [name] Colas M.S. OTR/L  10/05/23, 1:02 PM

## 2023-10-06 ENCOUNTER — Other Ambulatory Visit: Payer: Self-pay

## 2023-10-06 DIAGNOSIS — C259 Malignant neoplasm of pancreas, unspecified: Secondary | ICD-10-CM | POA: Diagnosis not present

## 2023-10-06 DIAGNOSIS — K1231 Oral mucositis (ulcerative) due to antineoplastic therapy: Secondary | ICD-10-CM | POA: Diagnosis not present

## 2023-10-06 DIAGNOSIS — Z7189 Other specified counseling: Secondary | ICD-10-CM | POA: Diagnosis not present

## 2023-10-06 DIAGNOSIS — Z515 Encounter for palliative care: Secondary | ICD-10-CM | POA: Diagnosis not present

## 2023-10-06 LAB — BASIC METABOLIC PANEL WITH GFR
Anion gap: 7 (ref 5–15)
BUN: 11 mg/dL (ref 8–23)
CO2: 23 mmol/L (ref 22–32)
Calcium: 8.2 mg/dL — ABNORMAL LOW (ref 8.9–10.3)
Chloride: 104 mmol/L (ref 98–111)
Creatinine, Ser: 0.86 mg/dL (ref 0.44–1.00)
GFR, Estimated: >60 mL/min (ref >60)
Glucose, Bld: 79 mg/dL (ref 70–99)
Potassium: 3.4 mmol/L — ABNORMAL LOW (ref 3.5–5.1)
Sodium: 134 mmol/L — ABNORMAL LOW (ref 135–145)

## 2023-10-06 LAB — CBC WITH DIFFERENTIAL/PLATELET
Abs Immature Granulocytes: 0.35 K/uL — ABNORMAL HIGH (ref 0.00–0.07)
Basophils Absolute: 0 K/uL (ref 0.0–0.1)
Basophils Relative: 0 %
Eosinophils Absolute: 0 K/uL (ref 0.0–0.5)
Eosinophils Relative: 0 %
HCT: 26.5 % — ABNORMAL LOW (ref 36.0–46.0)
Hemoglobin: 9 g/dL — ABNORMAL LOW (ref 12.0–15.0)
Immature Granulocytes: 8 %
Lymphocytes Relative: 34 %
Lymphs Abs: 1.6 K/uL (ref 0.7–4.0)
MCH: 30.4 pg (ref 26.0–34.0)
MCHC: 34 g/dL (ref 30.0–36.0)
MCV: 89.5 fL (ref 80.0–100.0)
Monocytes Absolute: 0.8 K/uL (ref 0.1–1.0)
Monocytes Relative: 17 %
Neutro Abs: 1.9 K/uL (ref 1.7–7.7)
Neutrophils Relative %: 41 %
Platelets: 134 K/uL — ABNORMAL LOW (ref 150–400)
RBC: 2.96 MIL/uL — ABNORMAL LOW (ref 3.87–5.11)
RDW: 14.5 % (ref 11.5–15.5)
Smear Review: NORMAL
WBC: 4.7 K/uL (ref 4.0–10.5)
nRBC: 4.5 % — ABNORMAL HIGH (ref 0.0–0.2)

## 2023-10-06 LAB — GLUCOSE, CAPILLARY
Glucose-Capillary: 73 mg/dL (ref 70–99)
Glucose-Capillary: 77 mg/dL (ref 70–99)
Glucose-Capillary: 79 mg/dL (ref 70–99)
Glucose-Capillary: 79 mg/dL (ref 70–99)
Glucose-Capillary: 88 mg/dL (ref 70–99)
Glucose-Capillary: 88 mg/dL (ref 70–99)

## 2023-10-06 LAB — PHOSPHORUS: Phosphorus: 2.4 mg/dL — ABNORMAL LOW (ref 2.5–4.6)

## 2023-10-06 LAB — MAGNESIUM: Magnesium: 2 mg/dL (ref 1.7–2.4)

## 2023-10-06 MED ORDER — PANTOPRAZOLE SODIUM 40 MG PO TBEC
40.0000 mg | DELAYED_RELEASE_TABLET | Freq: Two times a day (BID) | ORAL | Status: DC
  Administered 2023-10-06 – 2023-10-07 (×2): 40 mg via ORAL
  Filled 2023-10-06 (×2): qty 1

## 2023-10-06 MED ORDER — METOPROLOL TARTRATE 25 MG PO TABS
12.5000 mg | ORAL_TABLET | Freq: Two times a day (BID) | ORAL | Status: DC
  Administered 2023-10-06 – 2023-10-07 (×3): 12.5 mg via ORAL
  Filled 2023-10-06 (×2): qty 1

## 2023-10-06 MED ORDER — METOPROLOL TARTRATE 25 MG PO TABS: 12.5000 mg | ORAL_TABLET | Freq: Two times a day (BID) | ORAL | Status: DC

## 2023-10-06 MED ORDER — DEXTROSE 5 % IV SOLN: INTRAVENOUS | Status: DC

## 2023-10-06 MED ORDER — POTASSIUM CHLORIDE 20 MEQ PO PACK
40.0000 meq | PACK | Freq: Once | ORAL | Status: AC
Start: 2023-10-06 — End: 2023-10-06
  Administered 2023-10-06: 40 meq via ORAL
  Filled 2023-10-06: qty 2

## 2023-10-06 NOTE — Plan of Care (Signed)

## 2023-10-06 NOTE — Progress Notes (Signed)
 Palliative:  [name] is sitting up in bed.  She appears chronically ill and somewhat frail.  She greets me, making and somewhat keeping eye contact.  She is alert and oriented, able to make her needs known.  There is no family at bedside at this time.  We talked about her acute health concerns and the treatment plan.  [name] shares that there was an attempt for NG placement and x-ray department yesterday but they were unable to place.  She states that she believes that this will not be attempted again.  She states that overall, her mouth has improved.  We talked about the importance of continued Magic mouthwash in particular when she has further chemotherapy.  I encouraged her to have this medication readily available if and when needed.  [name] continues to state that her desire is to return to her own home with home health services.  She will also accept any and all cancer treatment's as offered stating that she does not want to give up.  Overall, [name] was able to meet her needs and make arrangements to get to her appointments.  Her niece/HCPOA, Clarissa, is a big help for her.  Conference with attending, bedside nursing staff, RD, ST, transition of care team related to patient condition, needs, goals of care, disposition.  Plan: Full scope/full code.  Continue cancer treatments as offered.  Home with home health.  50 minutes Lorenza Birkenhead, NP Palliative Medicine Team  Team Phone [phone]

## 2023-10-06 NOTE — Progress Notes (Signed)
       CROSS COVER NOTE  NAME: Colleen [name] MRN: [id number] DOB : 12-11-1945    Concern as stated by nurse / staff   Pt admitted on 9-6 for Mucositis (last chemo tx 09-27-23), Her Blood sugar is 74. Pt refused to drink OJ due to mucositis. Can u reinstate the D5 drip or dextrose  gel?      Pertinent findings on chart review: Last progress note reviewed, notable for : Acute Odynophagia/suspected mucositis with current chemo -- magic wash with lidocaine  -- full liquid diet -- PRN pain meds --ng tube attempted today by IR for nutrition support but unfortunately unsuccessful, anatomy at LES and cardia difficult to navigate, unsafe to advance the tube:  Patient Assessment   Assessment and  Interventions   Assessment:  Borderline low blood sugar Mucositis- with mouth pain  Plan: Restart D5 infusion Continue CBG checks X X

## 2023-10-06 NOTE — Progress Notes (Signed)
 [facility]  - [place] [facility]   PATIENT NAME: Colleen [name]    MR#:  [id number]  DATE OF BIRTH:  05/06/1945  SUBJECTIVE:  no family at bedside. I spoke with patient's niece Colleen [name] on the phone and updated her. Patient continues to have significant amount of saliva in her mouth and throat pain. She has been intermittently refusing meds per staff. Taking her cardiac meds with applesauce. Discussed importance of nutrition with temporary NG tube feeding till her mucositis improves. Patient wants to talk with Colleen [name] MOCCASIN:  Blood pressure (!) 102/54, pulse 68, temperature 98.3 F (36.8 C), temperature source Oral, resp. rate 17, height 5' 4 (1.626 m), weight 80.9 kg, SpO2 98%.  PHYSICAL EXAMINATION:   GENERAL:  78 y.o.-year-old patient with acute distress. Weak deconditioned LUNGS: decreased breath sounds bilaterally, no wheezing CARDIOVASCULAR: S1, S2 normal.  ABDOMEN: Soft, nontender, nondistended.   EXTREMITIES: No  edema b/l.    NEUROLOGIC: nonfocal  patient is alert and awake, deconditioned week    LABORATORY PANEL:  CBC Recent Labs  Lab 10/06/23 0611  WBC 4.7  HGB 9.0*  HCT 26.5*  PLT 134*    Chemistries  Recent Labs  Lab 10/01/23 1356 10/02/23 0436 10/06/23 0611  NA 135   < > 134*  K 3.5   < > 3.4*  CL 106   < > 104  CO2 20*   < > 23  GLUCOSE 123*   < > 79  BUN 17   < > 11  CREATININE 0.78   < > 0.86  CALCIUM  8.2*   < > 8.2*  MG  --    < > 2.0  AST 39  --   --   ALT 16  --   --   ALKPHOS 78  --   --   BILITOT 1.4*  --   --    < > = values in this interval not displayed.    Assessment and Plan  Colleen [name] is a 78 y.o. female with medical history significant of recently diagnosed stage IV metastatic pancreatic cancer on chemotherapy, gout, orthostatic hypotension, GERD, presented with painful swallowing.   Patient was diagnosed with stage IV metastatic pancreatic cancer last month and was started on chemotherapy 2  weeks ago and so far she received 2 infusions with last dose given this passing Tuesday regimen containing gemcitabine  and paclitaxel .  2 days ago patient woke up with severe pain in her mouth and throat, she found that it is very painful to swallow solid food and pills, so for the last few days she has not been able to eat much of the food and since yesterday even swallowing liquid became a problem and she she has been constantly spit out saliva.  Rapid a fib with RVR history of paroxysmal a fib on eliquis  follows with Dr. Darron -- now rate controlled --resumed BB + Eliquis , dose of bb reduced today as borderline hypotension and rate well controlled  Acute Odynophagia/suspected mucositis with current chemo -- magic wash with lidocaine  -- clears > full liquids > regular diet today -- PRN pain meds --ng tube attempted 9/11 by IR for nutrition support but unfortunately unsuccessful, anatomy at LES and cardia difficult to navigate, unsafe to advance the tube  Neutropenia Anc 0.4 > 1.9 with filgrastrim - monitor  Nutrition Status: Nutrition Problem: Moderate Malnutrition Etiology: cancer and cancer related treatments Signs/Symptoms: mild fat depletion, moderate fat depletion, mild muscle depletion, moderate muscle  depletion    Tolerating full liquids, advancing that to solids today  Severe pancytopenia due to chemo S/p 1 unit of blood now stable, neutropenia as above  Hypotension --cont midodrine   Stage IV pancreatic cancer -- patient follows with Colleen [name]. Started palliative chemotherapy two weeks ago. Plan for outpt f/u 9/16  History of COPD -- appears stable -- PRN nebs  Appreciate palliative care for goals of care. Overall long-term prognosis poor     Procedures: attempt at ng tube placement under fluoro Family communication : spoke with niece 9/11 Consults : [facility] cardiology CODE STATUS: FULL DVT Prophylaxis :eliquis  Level of care: Med-Surg Status is:  inpatient      Colleen [name] M.D    [facility]   CC: Primary care physician; Colleen Mole, PA-C

## 2023-10-06 NOTE — Consult Note (Signed)
 PHARMACIST - PHYSICIAN COMMUNICATION  DR:  Kandis  CONCERNING: IV to Oral Route Change Policy  RECOMMENDATION: This patient is receiving Pantoprazole  by the intravenous route.  Based on criteria approved by the Pharmacy and Therapeutics Committee, the intravenous medication(s) is/are being converted to the equivalent oral dose form(s).   DESCRIPTION: These criteria include: The patient is eating (either orally or via tube) and/or has been taking other orally administered medications for a least 24 hours The patient has no evidence of active gastrointestinal bleeding or impaired GI absorption (gastrectomy, short bowel, patient on TNA or NPO).  If you have questions about this conversion, please contact the Pharmacy Department  [x]   [phone] )  [facility]  Annabella LOISE Banks, [facility] 10/06/2023 12:15 PM

## 2023-10-06 NOTE — Progress Notes (Signed)
 Nutrition Follow-up  DOCUMENTATION CODES:   Non-severe (moderate) malnutrition in context of chronic illness  INTERVENTION:   -Continue 500 mg vitamin C  BID -Continue 220 mg zinc  sulfate daily x 14 days -Continue MVI daily  -Continue Ensure Plus High Protein po TID, each supplement provides 350 kcal and 20 grams of protein -Continue Magic cup TID with diet advancement, each supplement provides 290 kcal and 9 grams of protein  NUTRITION DIAGNOSIS:   Moderate Malnutrition related to cancer and cancer related treatments as evidenced by mild fat depletion, moderate fat depletion, mild muscle depletion, moderate muscle depletion.  Ongoing  GOAL:   Patient will meet greater than or equal to 90% of their needs  Progressing   MONITOR:   PO intake, Supplement acceptance, Labs, Weight trends, I & O's, Skin  REASON FOR ASSESSMENT:   Consult Assessment of nutrition requirement/status  ASSESSMENT:   78 y/o female with h/o recently diagnosed stage IV metastatic pancreatic cancer on chemotherapy, Afib, HTN, COPD, gout, HLD, CKD III, GERD, IDA, paraesophageal hernia, Barrett's esophagus, gastric/duodenal angioectasia, s/p gastroplasty (1970's), hidradenitis suppurativa, DM, HLD, PE and IDA who is admitted with Afib with RVR, FTT, pancytopenia and dyshagia secondary to mucositis.  9/10- advanced to full liquid diet, unsuccessful NGT attempt 9/11- advanced to regular diet  Reviewed I/O's: +480 ml x 24 hours and +5.2 L since admission  Pt lying in bed at time of visit. Pt somewhat interactive with this RD, but mostly provided one word answers. Pt shares that she has been eating, but reports she can only drink juices because that's all they give me. Informed pt that breakfast tray was delivered and noted items on tray (milk, juice, grits, and ice cream). Pt thankful for diet upgrade and increased food options. She shares she consumed most of her dinner yesterday and thinks she will be able  to eat more today. She reports she noticed that her swallowing pain has improved since last night.   Noted pt consumed 100% of lunch.   Case discussed with RN, palliative care, MD, and SLP. NGT placement was unsuccessful yesterday and no plans to replace. PLan to advance to regular diet and discharge home with home health services. RD mentioned that TPN could be considered if oral intake does not improve.   Medications reviewed and include vitamin C , megace , MVI, protonix , thiamine , zinc  sulfate, and dextrose  5% solution @ 40 ml/hr.   Labs reviewed: Na: 134, K: 3.4, CBGS: 74-110.   Diet Order:   Diet Order             Diet regular Room service appropriate? Yes; Fluid consistency: Thin  Diet effective now                   EDUCATION NEEDS:   Education needs have been addressed  Skin:  Skin Assessment: Skin Integrity Issues: Skin Integrity Issues:: Stage II Stage II: sacrum, rt buttocks  Last BM:  10/06/23 (type 7)  Height:   Ht Readings from Last 1 Encounters:  10/02/23 5' 4 (1.626 m)    Weight:   Wt Readings from Last 1 Encounters:  10/06/23 80.9 kg    Ideal Body Weight:  54.5 kg  BMI:  Body mass index is 30.61 kg/m.  Estimated Nutritional Needs:   Kcal:  1800-2100kcal/day  Protein:  90-105g/day  Fluid:  1.5-1.7L/day    Margery ORN, RD, LDN, CDCES Registered Dietitian III Certified Diabetes Care and Education Specialist If unable to reach this RD, please use RD Inpatient group  chat on secure chat between hours of 8am-4 pm daily

## 2023-10-07 DIAGNOSIS — K1231 Oral mucositis (ulcerative) due to antineoplastic therapy: Secondary | ICD-10-CM | POA: Diagnosis not present

## 2023-10-07 LAB — CBC WITH DIFFERENTIAL/PLATELET
Abs Immature Granulocytes: 2.14 K/uL — ABNORMAL HIGH (ref 0.00–0.07)
Basophils Absolute: 0 K/uL (ref 0.0–0.1)
Basophils Relative: 0 %
Eosinophils Absolute: 0 K/uL (ref 0.0–0.5)
Eosinophils Relative: 0 %
HCT: 30.4 % — ABNORMAL LOW (ref 36.0–46.0)
Hemoglobin: 10.2 g/dL — ABNORMAL LOW (ref 12.0–15.0)
Immature Granulocytes: 18 %
Lymphocytes Relative: 24 %
Lymphs Abs: 2.9 K/uL (ref 0.7–4.0)
MCH: 30.1 pg (ref 26.0–34.0)
MCHC: 33.6 g/dL (ref 30.0–36.0)
MCV: 89.7 fL (ref 80.0–100.0)
Monocytes Absolute: 1.4 K/uL — ABNORMAL HIGH (ref 0.1–1.0)
Monocytes Relative: 11 %
Neutro Abs: 5.6 K/uL (ref 1.7–7.7)
Neutrophils Relative %: 47 %
Platelets: 213 K/uL (ref 150–400)
RBC: 3.39 MIL/uL — ABNORMAL LOW (ref 3.87–5.11)
RDW: 14.6 % (ref 11.5–15.5)
Smear Review: NORMAL
WBC: 12.1 K/uL — ABNORMAL HIGH (ref 4.0–10.5)
nRBC: 3.1 % — ABNORMAL HIGH (ref 0.0–0.2)

## 2023-10-07 LAB — BASIC METABOLIC PANEL WITH GFR
Anion gap: 7 (ref 5–15)
BUN: 11 mg/dL (ref 8–23)
CO2: 24 mmol/L (ref 22–32)
Calcium: 8.4 mg/dL — ABNORMAL LOW (ref 8.9–10.3)
Chloride: 106 mmol/L (ref 98–111)
Creatinine, Ser: 0.93 mg/dL (ref 0.44–1.00)
GFR, Estimated: >60 mL/min (ref >60)
Glucose, Bld: 84 mg/dL (ref 70–99)
Potassium: 3.7 mmol/L (ref 3.5–5.1)
Sodium: 137 mmol/L (ref 135–145)

## 2023-10-07 LAB — GLUCOSE, CAPILLARY
Glucose-Capillary: 65 mg/dL — ABNORMAL LOW (ref 70–99)
Glucose-Capillary: 81 mg/dL (ref 70–99)
Glucose-Capillary: 87 mg/dL (ref 70–99)

## 2023-10-07 LAB — MAGNESIUM: Magnesium: 2.1 mg/dL (ref 1.7–2.4)

## 2023-10-07 MED ORDER — HEPARIN NA (PORK) LOCK FLSH PF 10 UNIT/ML IV SOLN
10.0000 [IU] | Freq: Once | INTRAVENOUS | Status: AC
  Administered 2023-10-07: 10 [IU]
  Filled 2023-10-07: qty 5

## 2023-10-07 MED ORDER — METOPROLOL TARTRATE 25 MG PO TABS: 12.5000 mg | ORAL_TABLET | Freq: Two times a day (BID) | ORAL | Status: DC

## 2023-10-07 NOTE — TOC Progression Note (Signed)
 Transition of Care [facility]) - Progression Note    Patient Details  Name: Colleen [name] MRN: [id number] Date of Birth: 07-02-1945  Transition of Care [facility]) CM/SW Contact  Racheal LITTIE Schimke, RN Phone Number: 10/07/2023,        Barriers to Discharge: Barriers Resolved               Expected Discharge Plan and Services         Expected Discharge Date: 10/07/23               DME Arranged: 3-N-1, Vannie rolling DME Agency: [employer] Date DME Agency Contacted: 10/04/23   Representative spoke with at DME Agency: [employer] HH Arranged: PT, OT, Nurse's Aide, RN HH Agency: [facility] Date [facility] Agency Contacted: 10/04/23   Representative spoke with at [facility] Agency: [name]   Social Drivers of Health (SDOH) Interventions SDOH Screenings   Food Insecurity: Food Insecurity Present (10/01/2023)  Housing: Low Risk  (10/01/2023)  Transportation Needs: Unmet Transportation Needs (10/01/2023)  Utilities: At Risk (10/01/2023)  Alcohol Screen: Low Risk  (12/02/2022)  Depression (PHQ2-9): Low Risk  (09/20/2023)  Financial Resource Strain: Low Risk  (12/02/2022)  Physical Activity: Insufficiently Active (12/02/2022)  Social Connections: Socially Isolated (10/01/2023)  Stress: No Stress Concern Present (12/02/2022)  Tobacco Use: Medium Risk (10/03/2023)  Health Literacy: Adequate Health Literacy (12/02/2022)    Readmission Risk Interventions    10/03/2023    3:20 PM  Readmission Risk Prevention Plan  Transportation Screening Complete  Medication Review (RN Care Manager) Complete  PCP or Specialist appointment within 3-5 days of discharge Complete  SW Recovery Care/Counseling Consult Complete  Palliative Care Screening Not Applicable  Skilled Nursing Facility Not Applicable

## 2023-10-07 NOTE — Progress Notes (Signed)
 Speech Language Pathology Treatment: Dysphagia  Patient Details Name: Colleen [name] MRN: [id number] DOB: 30-Aug-1945 Today's Date: 10/07/2023 Time: 1015-1040 SLP Time Calculation (min) (ACUTE ONLY): 25 min  Assessment / Plan / Recommendation Clinical Impression  Pt seen for ongoing assessment of toleration of po diet today. MD has upgraded pt's diet consistency as her odynophagia has improved(somewhat). Pt awake, verbal and followed directions w/ verbal cue. A/O x3. Noted pt had less mucous orally- pt stated this was improved. Oral care discussed, given. Encouraged pt to use try saline rinses also to aid oral healing. Pt appeared weak and needed Time and Support w/ all tasks. She endorsed Odynophagia was improved some w/ po's - Acute Odynophagia/suspected Mucositis with current chemotherapy, per chart. On RA, afebrile. WBC not elevated   Pt appears to present w/ grossly functional oropharyngeal phase swallowing w/ trial consistencies w/ No oropharyngeal phase dysphagia noted, No neuromuscular deficits noted. Pt consumed po trials w/ No overt, clinical s/s of aspiration during po trials. Pt appears at reduced risk for aspiration following general aspiration precautions.  However, pt does have challenging factors that could impact her oropharyngeal swallowing to include Pain/discomfort(NSG aware) when swallowing- Odynophagia s/p initiation of Chemotherapy txs(Magic Mouthwash is ongoing, pain med), fatigue/weakness, missing dentition, and metastatic Cancer dx. These factors can increase risk for dysphagia as well as decreased oral intake overall.    During po trials, pt consumed consistencies thin liquids, purees w/ softened solid w/ no overt coughing, decline in vocal quality, or change in respiratory presentation during/post trials. Few po's accepted but pt asked for a popcicle. Oral phase appeared grossly [facility] w/ timely bolus management and control of bolus propulsion for A-P transfer for swallowing-  more hesitation and increased oral phase time w/ increased textured boluses noted. Suspect she anticipated discomfort w/ swallows. Oral clearing achieved w/ trials. Moistened, cut/broken trials given for ease of oral phase. Pt fed self w/ setup support.    Recommend continue w/ the current upgraded Regular diet w/ more mech soft meats/less tougher foods such as beef(too chewy)- foods of her Preference to encourage oral intake. Foods of comfort and pleasure. Well-Cut meats and moistened foods would beneficial. Thin liquids. Recommend general aspiration precautions including Small sips/bites slowly. Alternate food/liquids to aid clearing of mouth and Esophagus. Pills CRUSHED in Puree for safer, easier swallowing as needed- whole if able to tolerate. REFLUX precautions. Frequent oral care, especially post meals. Swabs provided to pt. Encouraged saline rinses also.   Education given on food consistencies and easy to eat/chew foods; food prep including gravies/condiments/soups; general aspiration precautions; food preferences; Dietician f/u; oral care using saline rinse also.  Oncology following and also monitoring Mucositis issue; Dietician f/u recommended for support. Ongoing tx of mouth ulcers w/ ENT consult if indicated. MD/NSG updated, agreed. Palliative Care f/u for support.      HPI HPI: Pt is a 78 y.o. female  with PMHx notable for Pancreatic Adenocarcinoma, HTN, HLD, COPD, asthma, prediabetes, GERD, OA, gout, and PE.  Per chart notes early on 9/6, pt contacted Oncology office: patient has mouth sores and can not swallow due to pain. I recommend Magic mouth wash PRN.Colleen [name]  Later in day on 9/6, pt admitted to the ED: She subsequently developed pain in her mouth and difficulty swallowing post Chemotherapy tx.  She states that she is able to swallow some liquids but not much.  She has been unable to eat or take any of her medications.  She denies any difficulty with speaking.Colleen [name]  Pt  initiated Palliative  Chemotherapy on 9/2 per chart.  She had been hospitalized 08/2023 w/ decreased oral intake around that time also but some recovery in her appetite per Oncology office visit note.  No cxr imaging.      SLP Plan  All goals met          Recommendations  Diet recommendations: Dysphagia 3 (mechanical soft);Regular;Thin liquid (well-moistened, cut foods easy to chew/eat) Liquids provided via: Cup;Straw Medication Administration: Whole meds with puree (vs Crushed in puree) Supervision: Patient able to self feed (support) Compensations: Minimize environmental distractions;Slow rate;Small sips/bites;Lingual sweep for clearance of pocketing;Multiple dry swallows after each bite/sip;Follow solids with liquid Postural Changes and/or Swallow Maneuvers: Out of bed for meals;Seated upright 90 degrees;Upright 30-60 min after meal (Reflux precs.)                  Oral care QID;Oral care before and after PO;Patient independent with oral care (support)   Set up Supervision/Assistance Dysphagia, unspecified (R13.10) (in setting of odynophagia post Chemotherapy; missing dentition)     All goals met       Comer Portugal, MS, CCC-SLP Speech Language Pathologist Rehab Services; [facility] [phone] (ascom) Chany Woolworth  10/07/2023, 5:17 PM

## 2023-10-07 NOTE — Plan of Care (Signed)

## 2023-10-07 NOTE — Discharge Summary (Signed)
 Colleen [name] FMW:[id number] DOB: 1945-03-29 DOA: 10/01/2023  PCP: Leavy Mole, PA-C  Admit date: 10/01/2023 Discharge date: 10/07/2023  Time spent: 35 minutes  Recommendations for Outpatient Follow-up:  Pcp f/u Oncology f/u 9/17 as scheduled     Discharge Diagnoses:  Principal Problem:   Mucositis due to chemotherapy Active Problems:   Atrial fibrillation with RVR (HCC)   Protein-calorie malnutrition, moderate (HCC)   Primary pancreatic cancer with metastasis to other site [facility])   Dysphasia   Dysphagia   Hypotension   Odynophagia   Pressure injury of skin   Discharge Condition: improved  Diet recommendation: regular  Filed Weights   10/05/23 0504 10/06/23 0437 10/07/23 0500  Weight: 79.6 kg 80.9 kg 83.3 kg    History of present illness:  From admission h and p Colleen [name] is a 78 y.o. female with medical history significant of recently diagnosed stage IV metastatic pancreatic cancer on chemotherapy, gout, orthostatic hypotension, GERD, presented with painful swallowing.   Patient was diagnosed with stage IV metastatic pancreatic cancer last month and was started on chemotherapy 2 weeks ago and so far she received 2 infusions with last dose given this passing Tuesday regimen containing gemcitabine  and paclitaxel .  2 days ago patient woke up with severe pain in her mouth and throat, she found that it is very painful to swallow solid food and pills, so for the last few days she has not been able to eat much of the food and since yesterday even swallowing liquid became a problem and she she has been constantly spit out saliva.  She denies any choking or cough.  No abdominal pain no nauseous vomiting or diarrhea.  Went to see oncology today and was prescribed with Magic mouthwash.  Hospital Course:  Patient presents with mucositis from recent chemotherapy resulting in severe odynophagia and inability to tolerate PO. Attempt made to place NG tube with IR but they were unable to  safely advance it. Fortunately with time patient's mucositis cleared and we have been able to advance her to a regular diet which she is tolerating. She did experience an episode of a-fib rvr, home beta blocker was resumed though at a lower dose, this is a chronic problem, apixaban  continued. Did experience neutropenia here treated with a dose of granix , now resolved. For her pancreatic cancer she will f/u with oncology on 9/17 as scheduled. PT/OT advise skilled nursing but patient is adamant she is going home, so home health is ordered.   Procedures: none   Consultations: Cardiology, oncology  Discharge Exam: Vitals:   10/07/23 0821 10/07/23 1244  BP: 131/77 101/60  Pulse: 89 78  Resp: 18 18  Temp: 97.6 F (36.4 C) 98 F (36.7 C)  SpO2: 97% 95%    General: NAD Cardiovascular: RR Respiratory: CTAB Ext: warm  Discharge Instructions   Discharge Instructions     Diet - low sodium heart healthy   Complete by: As directed    Increase activity slowly   Complete by: As directed    No wound care   Complete by: As directed       Allergies as of 10/07/2023       Reactions   Fentanyl  Swelling   It messes with her heart.    Nsaids    Other Reaction(s): Not available   Oxycodone  Swelling        Medication List     STOP taking these medications    sodium chloride  1 g tablet  TAKE these medications    acetaminophen  650 MG CR tablet Commonly known as: TYLENOL  Take 650 mg by mouth every 8 (eight) hours as needed for pain.   Adult Blood Pressure Cuff Lg Kit 1 each by Does not apply route daily.   albuterol  108 (90 Base) MCG/ACT inhaler Commonly known as: VENTOLIN  HFA Inhale 2 puffs into the lungs every 4 (four) hours as needed for wheezing or shortness of breath.   allopurinol  100 MG tablet Commonly known as: ZYLOPRIM  Take 2 tablets (200 mg total) by mouth daily.   apixaban  2.5 MG Tabs tablet Commonly known as: Eliquis  Take 2 tablets (5 mg total) by  mouth 2 (two) times daily. What changed: how much to take   Breztri  Aerosphere 160-9-4.8 MCG/ACT Aero inhaler Generic drug: budesonide -glycopyrrolate -formoterol  INHALE 2 PUFFS TWICE DAILY   cholecalciferol 25 MCG (1000 UNIT) tablet Commonly known as: VITAMIN D3 Take 1,000 Units by mouth daily.   lidocaine -prilocaine  cream Commonly known as: EMLA  Apply to affected area once   magic mouthwash (lidocaine , diphenhydrAMINE, alum & mag hydroxide) suspension Swish and spit 5 mLs 4 (four) times daily as needed for mouth pain.   megestrol  20 MG tablet Commonly known as: MEGACE  Take 1 tablet (20 mg total) by mouth 2 (two) times daily.   metoprolol  tartrate 25 MG tablet Commonly known as: LOPRESSOR  Take 0.5 tablets (12.5 mg total) by mouth 2 (two) times daily. What changed: how much to take   midodrine  5 MG tablet Commonly known as: PROAMATINE  Take 1 tablet (5 mg total) by mouth 3 (three) times daily with meals.   MULTIVITAMIN WOMEN 50+ PO Take 1 tablet by mouth daily.   ondansetron  4 MG disintegrating tablet Commonly known as: ZOFRAN -ODT Take 1 tablet (4 mg total) by mouth every 8 (eight) hours as needed for nausea or vomiting.   ondansetron  8 MG tablet Commonly known as: Zofran  Take 1 tablet (8 mg total) by mouth every 8 (eight) hours as needed for nausea or vomiting.   pantoprazole  40 MG tablet Commonly known as: PROTONIX  Take 1 tablet (40 mg total) by mouth daily.   potassium chloride  SA 20 MEQ tablet Commonly known as: KLOR-CON  M Take 1 tablet (20 mEq total) by mouth daily.   prochlorperazine  10 MG tablet Commonly known as: COMPAZINE  Take 1 tablet (10 mg total) by mouth every 6 (six) hours as needed for nausea or vomiting.   simethicone  80 MG chewable tablet Commonly known as: Gas-X Chew 1 tablet (80 mg total) by mouth 2 (two) times a day.   sodium chloride  flush 0.9 % Soln injection Inject 10 mLs into the vein daily.   triamcinolone  cream 0.1 % Commonly known  as: KENALOG  APPLY TO AFFECTED AREA TWICE DAILY IF NEEDED. NO MORE THAN 1 WEEK AT A TIME, THEN TAKE A BREAK FOR 2 WEEKS.       Allergies  Allergen Reactions   Fentanyl  Swelling    It messes with her heart.    Nsaids     Other Reaction(s): Not available   Oxycodone  Swelling    Follow-up Information     Leavy Mole, PA-C Follow up.   Specialty: Family Medicine Why: hospital follow up Contact information: [address] [place] [address] [phone]                  The results of significant diagnostics from this hospitalization (including imaging, microbiology, ancillary and laboratory) are listed below for reference.    Significant Diagnostic Studies: DG Naso  G Tube Plc W/Fl W/Rad Result Date: 10/05/2023 INDICATION: Moderate malnutrition with history of pancreatic cancer. History of gastroplasty and possible paraesophageal hernia. Recently with worsening dysphagia and mucositis. Request for NGT placement for additional nutrition. EXAM: NASO G TUBE PLACEMENT WITH FL AND WITH RAD FLUOROSCOPY TIME:  Radiation Exposure Index (as provided by the fluoroscopic device): 12.3 mGy Kerma COMPLICATIONS: None immediate PROCEDURE: The Dobhoff tube was lubricated and inserted into the right nostril. Under intermittent fluoroscopic guidance, the Dobhoff tube was advanced down to the lower esophageal sphincter. Several attempts were made to advance the catheter through the sphincter and into the stomach; however, each time was met with resistance causing the tubing to curl in the oropharynx. Dr. Marcelino was called into the room for additional assistance, but all attempts to advance the tube were met with resistance. Given that the catheter could not be advanced safely into the stomach, the decision was made to remove the tube altogether vs leaving in place and risking retraction of the catheter or aspiration with feedings. IMPRESSION: Given history of gastroplasty and  questionable paraesophageal hernia, complicated anatomy at the level of the LES and gastric cardia are difficult to navigate and the risk of advancing tube against high resistance was deemed greater than the benefit of placement. Unsuccessful fluoroscopic guided placement of Dobhoff tube. No tube placed. Consider evaluating for other forms of nutrition supplementation at this time. This exam was performed by Sherrilee Bal, PA-C, and was supervised and interpreted by Dr. Harrietta Marcelino. Electronically Signed   By: Harrietta Marcelino M.D.   On: 10/05/2023 11:06   ECHOCARDIOGRAM COMPLETE Result Date: 10/03/2023    ECHOCARDIOGRAM REPORT   Patient Name:   Colleen [name] Date of Exam: 10/03/2023 Medical Rec #:  [id number]      Height:       64.0 in Accession #:    [id number]     Weight:       176.1 lb Date of Birth:  07-05-1945      BSA:          1.854 m Patient Age:    78 years       BP:           106/66 mmHg Patient Gender: F              HR:           118 bpm. Exam Location:  [facility] Procedure: 2D Echo, Cardiac Doppler and Color Doppler (Both Spectral and Color            Flow Doppler were utilized during procedure). Indications:     Atrial Fibrillation I48.91  History:         Patient has prior history of Echocardiogram examinations, most                  recent 04/13/2019. Arrythmias:Atrial Fibrillation.  Sonographer:     Ashley McNeely-Sloane Referring Phys:  [id number] [facility] Diagnosing Phys: Deatrice Cage MD IMPRESSIONS  1. Left ventricular ejection fraction, by estimation, is 55 to 60%. The left ventricle has normal function. The left ventricle has no regional wall motion abnormalities. Left ventricular diastolic parameters are indeterminate.  2. Right ventricular systolic function is normal. The right ventricular size is normal. There is mildly elevated pulmonary artery systolic pressure.  3. Left atrial size was mildly dilated.  4. Right atrial size was mildly dilated.  5. The mitral valve is normal in  structure. Mild mitral valve regurgitation. No evidence of mitral  stenosis.  6. Tricuspid valve regurgitation is moderate.  7. The aortic valve is normal in structure. Aortic valve regurgitation is not visualized. No aortic stenosis is present.  8. The inferior vena cava is dilated in size with >50% respiratory variability, suggesting right atrial pressure of 8 mmHg. FINDINGS  Left Ventricle: Left ventricular ejection fraction, by estimation, is 55 to 60%. The left ventricle has normal function. The left ventricle has no regional wall motion abnormalities. The left ventricular internal cavity size was normal in size. There is  no left ventricular hypertrophy. Left ventricular diastolic parameters are indeterminate. Right Ventricle: The right ventricular size is normal. No increase in right ventricular wall thickness. Right ventricular systolic function is normal. There is mildly elevated pulmonary artery systolic pressure. The tricuspid regurgitant velocity is 3.00  m/s, and with an assumed right atrial pressure of 8 mmHg, the estimated right ventricular systolic pressure is 44.0 mmHg. Left Atrium: Left atrial size was mildly dilated. Right Atrium: Right atrial size was mildly dilated. Pericardium: There is no evidence of pericardial effusion. Mitral Valve: The mitral valve is normal in structure. Mild mitral valve regurgitation. No evidence of mitral valve stenosis. MV peak gradient, 3.5 mmHg. The mean mitral valve gradient is 2.0 mmHg. Tricuspid Valve: The tricuspid valve is normal in structure. Tricuspid valve regurgitation is moderate . No evidence of tricuspid stenosis. Aortic Valve: The aortic valve is normal in structure. Aortic valve regurgitation is not visualized. No aortic stenosis is present. Aortic valve mean gradient measures 3.0 mmHg. Aortic valve peak gradient measures 4.9 mmHg. Aortic valve area, by VTI measures 1.59 cm. Pulmonic Valve: The pulmonic valve was normal in structure. Pulmonic valve  regurgitation is mild. No evidence of pulmonic stenosis. Aorta: The aortic root is normal in size and structure. Venous: The inferior vena cava is dilated in size with greater than 50% respiratory variability, suggesting right atrial pressure of 8 mmHg. IAS/Shunts: No atrial level shunt detected by color flow Doppler.  LEFT VENTRICLE PLAX 2D LVIDd:         4.70 cm LVIDs:         3.20 cm LV PW:         1.30 cm LV IVS:        0.90 cm LVOT diam:     1.75 cm LV SV:         35 LV SV Index:   19 LVOT Area:     2.41 cm  LV Volumes (MOD) LV vol d, MOD A2C: 59.9 ml LV vol d, MOD A4C: 67.0 ml LV vol s, MOD A2C: 31.0 ml LV vol s, MOD A4C: 32.0 ml LV SV MOD A2C:     28.9 ml LV SV MOD A4C:     67.0 ml LV SV MOD BP:      35.3 ml RIGHT VENTRICLE RV S prime:     9.36 cm/s LEFT ATRIUM           Index        RIGHT ATRIUM           Index LA diam:      3.60 cm 1.94 cm/m   RA Area:     24.70 cm LA Vol (A4C): 84.8 ml 45.75 ml/m  RA Volume:   82.80 ml  44.67 ml/m  AORTIC VALVE                    PULMONIC VALVE AV Area (Vmax):    1.81 cm  PV Vmax:        1.17 m/s AV Area (Vmean):   1.70 cm     PV Vmean:       80.100 cm/s AV Area (VTI):     1.59 cm     PV VTI:         0.248 m AV Vmax:           111.00 cm/s  PV Peak grad:   5.5 mmHg AV Vmean:          76.800 cm/s  PV Mean grad:   3.0 mmHg AV VTI:            0.220 m      RVOT Peak grad: 2 mmHg AV Peak Grad:      4.9 mmHg AV Mean Grad:      3.0 mmHg LVOT Vmax:         83.60 cm/s LVOT Vmean:        54.400 cm/s LVOT VTI:          0.145 m LVOT/AV VTI ratio: 0.66  AORTA Ao Root diam: 2.90 cm Ao Asc diam:  3.00 cm MITRAL VALVE               TRICUSPID VALVE MV Area (PHT): 3.19 cm    TR Peak grad:   36.0 mmHg MV Area VTI:   1.75 cm    TR Mean grad:   27.0 mmHg MV Peak grad:  3.5 mmHg    TR Vmax:        300.00 cm/s MV Mean grad:  2.0 mmHg    TR Vmean:       255.0 cm/s MV Vmax:       0.93 m/s MV Vmean:      61.4 cm/s   SHUNTS MV Decel Time: 238 msec    Systemic VTI:  0.14 m MV E velocity:  72.10 cm/s  Systemic Diam: 1.75 cm MV A velocity: 47.70 cm/s  Pulmonic VTI:  0.151 m MV E/A ratio:  1.51 Deatrice Cage MD Electronically signed by Deatrice Cage MD Signature Date/Time: 10/03/2023/12:29:40 PM    Final    IR REMOVAL BILIARY DRAIN Result Date: 09/22/2023 CLINICAL DATA:  Patient with a history of metastatic pancreatic cancer with obstruction of the common bile duct status post internal/external biliary drain placed 08/08/2023 in IR. Patient underwent EUS at [facility] with successful placement of stent into the common bile duct. The biliary drain has been capped since the end of July. EXAM: IR CHOLANGIOGRAM THROUGH EXISTING TUBE IR REMOVE BILIARY DRAIN COMPARISON:  IR internal/external biliary drain 08/08/2023. CONTRAST:  5 mL Isovue -300-administered via the existing percutaneous drain. FLUOROSCOPY TIME:  Radiation exposure index as provided by fluoroscopic device: 33.8 mGy air kerma TECHNIQUE: The patient was positioned supine on the fluoroscopy table. A pre-procedural spot fluoroscopic image was obtained of the left upper quadrant and the existing internal/external biliary catheter. Multiple spot fluoroscopic and radiographic images were obtained following the injection of a small amount of contrast via the existing percutaneous drainage catheter. FINDINGS: Contrast was easily injected into the drain with contrast flowing rapidly through the first part of the catheter and then preferentially flowing into the duodenum via the stented common bile duct. The catheter was cut and an Amplatz wire was inserted. Once the wire was successfully maneuvered through the catheter the biliary drain was removed over the wire without complication. The patient tolerated the removal well. IMPRESSION: Contrast injection confirmed patency of the common bile duct stent.  The biliary drain was successfully removed over an Amplatz wire. The site was covered with gauze/Tegaderm. Electronically Signed   By: Wilkie Lent M.D.    On: 09/22/2023 12:23   IR Radiologist Eval & Mgmt Result Date: 09/22/2023 EXAM: See EPIC note CHIEF COMPLAINT: See EPIC note HISTORY OF PRESENT ILLNESS: See EPIC note REVIEW OF SYSTEMS: See EPIC note PHYSICAL EXAMINATION: See EPIC note ASSESSMENT AND PLAN: See EPIC note Electronically Signed   By: Wilkie Lent M.D.   On: 09/22/2023 12:04   IR IMAGING GUIDED PORT INSERTION Result Date: 09/12/2023 INDICATION: 78 year old female with pancreatic cancer in need of durable venous access for chemotherapy. EXAM: IMPLANTED PORT A CATH PLACEMENT WITH ULTRASOUND AND FLUOROSCOPIC GUIDANCE MEDICATIONS: None. ANESTHESIA/SEDATION: Versed  2 mg IV; Fentanyl  100 mcg IV; administered intravenously by the Radiology nurse Moderate Sedation Time:  18 minutes The patient's vital signs and level of consciousness were continuously monitored during the procedure by the interventional radiology nurse under my direct supervision. FLUOROSCOPY: Radiation exposure index: 0 mGy, reference air kerma COMPLICATIONS: None immediate. PROCEDURE: The right neck and chest was prepped with chlorhexidine , and draped in the usual sterile fashion using maximum barrier technique (cap and mask, sterile gown, sterile gloves, large sterile sheet, hand hygiene and cutaneous antiseptic). Local anesthesia was attained by infiltration with 1% lidocaine  with epinephrine . Ultrasound demonstrated patency of the right internal jugular vein, and this was documented with an image. Under real-time ultrasound guidance, this vein was accessed with a 21 gauge micropuncture needle and image documentation was performed. A small dermatotomy was made at the access site with an 11 scalpel. A 0.018 wire was advanced into the SVC and the access needle exchanged for a 110F micropuncture vascular sheath. The 0.018 wire was then removed and a 0.035 wire advanced into the IVC. An appropriate location for the subcutaneous reservoir was selected below the clavicle and an  incision was made through the skin and underlying soft tissues. The subcutaneous tissues were then dissected using a combination of blunt and sharp surgical technique and a pocket was formed. A Bard Clear Vue single lumen power injectable portacatheter was then tunneled through the subcutaneous tissues from the pocket to the dermatotomy and the port reservoir placed within the subcutaneous pocket. The venous access site was then serially dilated and a peel away vascular sheath placed over the wire. The wire was removed and the port catheter advanced into position under fluoroscopic guidance. The catheter tip is positioned in the superior cavoatrial junction. This was documented with a spot image. The portacatheter was then tested and found to flush and aspirate well. The port was flushed with saline followed by 100 units/mL heparinized saline. The pocket was then closed in two layers using first subdermal inverted interrupted absorbable sutures followed by a running subcuticular suture. The epidermis was then sealed with Dermabond. The dermatotomy at the venous access site was also closed with Dermabond. IMPRESSION: Successful placement of a right IJ approach Bard Clear Vue port catheter with ultrasound and fluoroscopic guidance. The catheter is ready for use. Electronically Signed   By: Wilkie Lent M.D.   On: 09/12/2023 13:50    Microbiology: Recent Results (from the past 240 hours)  MRSA Next Gen by PCR, Nasal     Status: None   Collection Time: 10/02/23 12:52 PM   Specimen: Nasal Mucosa; Nasal Swab  Result Value Ref Range Status   MRSA by PCR Next Gen NOT DETECTED NOT DETECTED Final    Comment: (NOTE) The GeneXpert MRSA  Assay (FDA approved for NASAL specimens only), is one component of a comprehensive MRSA colonization surveillance program. It is not intended to diagnose MRSA infection nor to guide or monitor treatment for MRSA infections. Test performance is not FDA approved in patients less  than 68 years old. Performed at [facility], [address] [place], [place] [address]      Labs: Basic Metabolic Panel: Recent Labs  Lab 10/02/23 [phone] 10/02/23 1955 10/03/23 0438 10/04/23 0519 10/05/23 0514 10/06/23 0611 10/07/23 0500  NA 138  --  138  --  136 134* 137  K 3.5  --  3.2*  --  3.6 3.4* 3.7  CL 109  --  109  --  106 104 106  CO2 23  --  22  --  23 23 24   GLUCOSE 98  --  127*  --  114* 79 84  BUN 15  --  12  --  11 11 11   CREATININE 0.61  --  0.63  --  0.73 0.86 0.93  CALCIUM  7.8*  --  7.8*  --  8.2* 8.2* 8.4*  MG 1.6*   < > 1.9 2.1 2.1 2.0 2.1  PHOS  --   --  2.3* 2.4* 2.3* 2.4*  --    < > = values in this interval not displayed.   Liver Function Tests: Recent Labs  Lab 10/01/23 1356  AST 39  ALT 16  ALKPHOS 78  BILITOT 1.4*  PROT 6.1*  ALBUMIN 1.9*   Recent Labs  Lab 10/01/23 1356  LIPASE 18   No results for input(s): AMMONIA in the last 168 hours. CBC: Recent Labs  Lab 10/01/23 1356 10/02/23 0436 10/02/23 1955 10/05/23 0514 10/06/23 0611 10/07/23 0500  WBC 3.2* 2.1*  --  1.8* 4.7 12.1*  NEUTROABS  --   --   --  0.4* 1.9 5.6  HGB 7.9* 7.1* 8.9* 9.5* 9.0* 10.2*  HCT 23.8* 21.4* 26.2* 28.1* 26.5* 30.4*  MCV 93.3 92.6  --  89.2 89.5 89.7  PLT 114* 80*  --  75* 134* 213   Cardiac Enzymes: No results for input(s): CKTOTAL, CKMB, CKMBINDEX, TROPONINI in the last 168 hours. BNP: BNP (last 3 results) No results for input(s): BNP in the last 8760 hours.  ProBNP (last 3 results) No results for input(s): PROBNP in the last 8760 hours.  CBG: Recent Labs  Lab 10/06/23 1603 10/06/23 2107 10/07/23 0021 10/07/23 0823 10/07/23 1218  GLUCAP 73 88 81 65* 87       Signed:  Devaughn KATHEE Ban MD.  [facility] 10/07/2023, 1:24 PM

## 2023-10-10 ENCOUNTER — Inpatient Hospital Stay

## 2023-10-10 ENCOUNTER — Other Ambulatory Visit: Payer: Self-pay

## 2023-10-10 ENCOUNTER — Inpatient Hospital Stay (HOSPITAL_BASED_OUTPATIENT_CLINIC_OR_DEPARTMENT_OTHER): Admitting: Nurse Practitioner

## 2023-10-10 ENCOUNTER — Telehealth: Payer: Self-pay | Admitting: *Deleted

## 2023-10-10 ENCOUNTER — Encounter: Payer: Self-pay | Admitting: Nurse Practitioner

## 2023-10-10 VITALS — BP 135/54 | HR 84 | Temp 97.7°F | Resp 18

## 2023-10-10 DIAGNOSIS — E876 Hypokalemia: Secondary | ICD-10-CM | POA: Diagnosis not present

## 2023-10-10 DIAGNOSIS — C259 Malignant neoplasm of pancreas, unspecified: Secondary | ICD-10-CM

## 2023-10-10 DIAGNOSIS — L89312 Pressure ulcer of right buttock, stage 2: Secondary | ICD-10-CM | POA: Diagnosis not present

## 2023-10-10 DIAGNOSIS — Z5111 Encounter for antineoplastic chemotherapy: Secondary | ICD-10-CM | POA: Diagnosis not present

## 2023-10-10 LAB — CBC WITH DIFFERENTIAL/PLATELET
Abs Immature Granulocytes: 4.37 K/uL — ABNORMAL HIGH (ref 0.00–0.07)
Basophils Absolute: 0.1 K/uL (ref 0.0–0.1)
Basophils Relative: 0 %
Eosinophils Absolute: 0 K/uL (ref 0.0–0.5)
Eosinophils Relative: 0 %
HCT: 32.2 % — ABNORMAL LOW (ref 36.0–46.0)
Hemoglobin: 10.9 g/dL — ABNORMAL LOW (ref 12.0–15.0)
Immature Granulocytes: 18 %
Lymphocytes Relative: 15 %
Lymphs Abs: 3.7 K/uL (ref 0.7–4.0)
MCH: 30.5 pg (ref 26.0–34.0)
MCHC: 33.9 g/dL (ref 30.0–36.0)
MCV: 90.2 fL (ref 80.0–100.0)
Monocytes Absolute: 3 K/uL — ABNORMAL HIGH (ref 0.1–1.0)
Monocytes Relative: 12 %
Neutro Abs: 13.6 K/uL — ABNORMAL HIGH (ref 1.7–7.7)
Neutrophils Relative %: 55 %
Platelets: 341 K/uL (ref 150–400)
RBC: 3.57 MIL/uL — ABNORMAL LOW (ref 3.87–5.11)
RDW: 15.2 % (ref 11.5–15.5)
WBC: 24.8 K/uL — ABNORMAL HIGH (ref 4.0–10.5)
nRBC: 0.8 % — ABNORMAL HIGH (ref 0.0–0.2)

## 2023-10-10 LAB — COMPREHENSIVE METABOLIC PANEL WITH GFR
ALT: 22 U/L (ref 0–44)
AST: 47 U/L — ABNORMAL HIGH (ref 15–41)
Albumin: 2.3 g/dL — ABNORMAL LOW (ref 3.5–5.0)
Alkaline Phosphatase: 118 U/L (ref 38–126)
Anion gap: 9 (ref 5–15)
BUN: 12 mg/dL (ref 8–23)
CO2: 23 mmol/L (ref 22–32)
Calcium: 8 mg/dL — ABNORMAL LOW (ref 8.9–10.3)
Chloride: 101 mmol/L (ref 98–111)
Creatinine, Ser: 0.89 mg/dL (ref 0.44–1.00)
GFR, Estimated: >60 mL/min (ref >60)
Glucose, Bld: 94 mg/dL (ref 70–99)
Potassium: 2.9 mmol/L — ABNORMAL LOW (ref 3.5–5.1)
Sodium: 133 mmol/L — ABNORMAL LOW (ref 135–145)
Total Bilirubin: 0.6 mg/dL (ref 0.0–1.2)
Total Protein: 6.1 g/dL — ABNORMAL LOW (ref 6.5–8.1)

## 2023-10-10 MED ORDER — POTASSIUM CHLORIDE CRYS ER 20 MEQ PO TBCR: 20.0000 meq | EXTENDED_RELEASE_TABLET | Freq: Two times a day (BID) | ORAL | 1 refills | Status: DC

## 2023-10-10 MED ORDER — CALCIUM 600 600 MG PO TABS: 1200.0000 mg | ORAL_TABLET | Freq: Every day | ORAL | 2 refills | Status: DC

## 2023-10-10 NOTE — Progress Notes (Signed)
 Symptom Management Clinic  [facility] at [facility] A Department of the [place]. [facility] [address] [place], [place] [address] [phone] (phone) [phone] (fax)  Patient Care Team: Leavy Mole, PA-C as PCP - General (Family Medicine) Darliss Rogue, MD as PCP - Cardiology (Cardiology) Dave Agent, MD as Consulting Physician (Rheumatology) Emilio Garrie BRAVO, RN (Inactive) as Registered Nurse Ceola Tereasa PARAS, MD as Referring Physician (Oncology) Therisa Bi, MD as Consulting Physician (Gastroenterology) Maurie Rayfield BIRCH, RN as Oncology Nurse Navigator Jacobo Evalene PARAS, MD as Consulting Physician (Oncology)   Name of the patient: Colleen [name]  [id number]  1945/05/24   Date of visit: 10/10/23  Diagnosis- Pancreatic Cancer  Chief complaint/ Reason for visit- bleeding, weakness  Heme/Onc history:  Oncology History  Primary pancreatic cancer with metastasis to other site [facility])  09/07/2023 Initial Diagnosis   Adenocarcinoma of pancreas (HCC)   09/07/2023 Cancer Staging   Staging form: Exocrine Pancreas, AJCC 8th Edition - Clinical stage from 09/07/2023: Stage IV (cT2, cN0, cM1) - Signed by Jacobo Evalene PARAS, MD on 09/07/2023 Stage prefix: Initial diagnosis Total positive nodes: 0   09/20/2023 -  Chemotherapy   Patient is on Treatment Plan : PANCREATIC Abraxane  D1,8,15 + Gemcitabine  D1,8,15 q28d       Interval history- RAILYN HOUSE is a 78 y.o. female diagnosed with metastatic pancreatic cancer who presents to symptom management clinic for complaints of seeing blood on toilet today. Symptoms occurred twice. She's unaware of source. Has not noticed bleeding otherwise. Unsure if rectal, vaginal, urinary, or from her skin. She said she was told she had a pressure sore when she was in the hospital, dressing was applied but she removed once home. Appetite is improving. She is weak but walks with her walker short distances.  Her daughter came to help her this morning. Mouth sores have resolved and she feels that intake has improved since hospitalization.   ECOG FS:3 - Symptomatic, >50% confined to bed  Review of systems- Review of Systems  Constitutional:  Positive for malaise/fatigue. Negative for chills, fever and weight loss.  HENT:  Negative for hearing loss, nosebleeds, sore throat and tinnitus.   Eyes:  Negative for blurred vision and double vision.  Respiratory:  Negative for cough, hemoptysis, shortness of breath and wheezing.   Cardiovascular:  Negative for chest pain, palpitations and leg swelling.  Gastrointestinal:  Positive for constipation. Negative for abdominal pain, blood in stool, diarrhea, melena, nausea and vomiting.       Per hpi  Genitourinary:  Negative for dysuria and urgency.  Musculoskeletal:  Negative for back pain, falls, joint pain and myalgias.  Skin:  Negative for itching and rash.  Neurological:  Positive for weakness. Negative for dizziness, tingling, sensory change, loss of consciousness and headaches.  Endo/Heme/Allergies:  Negative for environmental allergies. Does not bruise/bleed easily.  Psychiatric/Behavioral:  Negative for depression. The patient is not nervous/anxious and does not have insomnia.     Current treatment- gem-abraxane   Allergies  Allergen Reactions   Fentanyl  Swelling    It messes with her heart.    Nsaids     Other Reaction(s): Not available   Oxycodone  Swelling    Past Medical History:  Diagnosis Date   Asthma    Cataract    COPD (chronic obstructive pulmonary disease) (HCC)    GERD (gastroesophageal reflux disease)    Gout    History of pulmonary embolus (PE)    Hyperlipidemia    Hypertension  Osteoarthrosis    Prediabetes     Past Surgical History:  Procedure Laterality Date   BACK SURGERY     BREAST BIOPSY Left 04/08/2014   intraductal papilloma 5 mm   COLONOSCOPY WITH PROPOFOL  N/A 04/23/2020   Procedure: COLONOSCOPY WITH  PROPOFOL ;  Surgeon: Janalyn Keene NOVAK, MD;  Location: [facility] ENDOSCOPY;  Service: Endoscopy;  Laterality: N/A;   ERCP N/A 08/05/2023   Procedure: ERCP, WITH INTERVENTION IF INDICATED;  Surgeon: Jinny Carmine, MD;  Location: [facility] ENDOSCOPY;  Service: Endoscopy;  Laterality: N/A;   ESOPHAGOGASTRODUODENOSCOPY (EGD) WITH PROPOFOL  N/A 04/23/2020   Procedure: ESOPHAGOGASTRODUODENOSCOPY (EGD) WITH PROPOFOL ;  Surgeon: Janalyn Keene NOVAK, MD;  Location: [facility] ENDOSCOPY;  Service: Endoscopy;  Laterality: N/A;   GIVENS CAPSULE STUDY N/A 01/28/2022   Procedure: GIVENS CAPSULE STUDY;  Surgeon: Therisa Bi, MD;  Location: [facility] ENDOSCOPY;  Service: Gastroenterology;  Laterality: N/A;   IR IMAGING GUIDED PORT INSERTION  09/12/2023   IR INT EXT BILIARY DRAIN WITH CHOLANGIOGRAM  08/08/2023   IR RADIOLOGIST EVAL & MGMT  09/22/2023   IR REMOVAL BILIARY DRAIN  09/22/2023   LUMBAR LAMINECTOMY     TONSILLECTOMY      Social History   Socioeconomic History   Marital status: Widowed    Spouse name: Susann   Number of children: 0   Years of education: Not on file   Highest education level: 12th grade  Occupational History   Occupation: Retired  Tobacco Use   Smoking status: Former    Current packs/day: 0.00    Average packs/day: 1.5 packs/day for 20.0 years (30.0 ttl pk-yrs)    Types: Cigarettes    Start date: 11/13/1988    Quit date: 11/13/2008    Years since quitting: 14.9   Smokeless tobacco: Never   Tobacco comments:    smoking cessation materials not required  Vaping Use   Vaping status: Never Used  Substance and Sexual Activity   Alcohol use: Not Currently   Drug use: No   Sexual activity: Not Currently  Other Topics Concern   Not on file  Social History Narrative   Pt lives alone   Social Drivers of Health   Financial Resource Strain: Low Risk  (12/02/2022)   Overall Financial Resource Strain (CARDIA)    Difficulty of Paying Living Expenses: Not very hard  Food Insecurity: Food Insecurity  Present (10/01/2023)   Hunger Vital Sign    Worried About Running Out of Food in the Last Year: Never true    Ran Out of Food in the Last Year: Sometimes true  Transportation Needs: Unmet Transportation Needs (10/01/2023)   PRAPARE - Administrator, Civil Service (Medical): Yes    Lack of Transportation (Non-Medical): No  Physical Activity: Insufficiently Active (12/02/2022)   Exercise Vital Sign    Days of Exercise per Week: 2 days    Minutes of Exercise per Session: 60 min  Stress: No Stress Concern Present (12/02/2022)   [employer] of Occupational Health - Occupational Stress Questionnaire    Feeling of Stress : Not at all  Social Connections: Socially Isolated (10/01/2023)   Social Connection and Isolation Panel    Frequency of Communication with Friends and Family: More than three times a week    Frequency of Social Gatherings with Friends and Family: More than three times a week    Attends Religious Services: Never    Database administrator or Organizations: No    Attends Banker Meetings: Never  Marital Status: Widowed  Intimate Partner Violence: Not At Risk (10/01/2023)   Humiliation, Afraid, Rape, and Kick questionnaire    Fear of Current or Ex-Partner: No    Emotionally Abused: No    Physically Abused: No    Sexually Abused: No    Family History  Problem Relation Age of Onset   Diabetes Mother    Lung cancer Father    Diabetes Sister    Congestive Heart Failure Sister    Glaucoma Sister    Breast cancer Maternal Aunt      Current Outpatient Medications:    allopurinol  (ZYLOPRIM ) 100 MG tablet, Take 2 tablets (200 mg total) by mouth daily., Disp: 180 tablet, Rfl: 1   apixaban  (ELIQUIS ) 2.5 MG TABS tablet, Take 2 tablets (5 mg total) by mouth 2 (two) times daily., Disp: , Rfl:    Blood Pressure Monitoring (ADULT BLOOD PRESSURE CUFF LG) KIT, 1 each by Does not apply route daily., Disp: 1 kit, Rfl: 0   cholecalciferol (VITAMIN D3) 25 MCG  (1000 UNIT) tablet, Take 1,000 Units by mouth daily., Disp: , Rfl:    lidocaine -prilocaine  (EMLA ) cream, Apply to affected area once, Disp: 30 g, Rfl: 3   ondansetron  (ZOFRAN ) 8 MG tablet, Take 1 tablet (8 mg total) by mouth every 8 (eight) hours as needed for nausea or vomiting., Disp: 60 tablet, Rfl: 2   potassium chloride  SA (KLOR-CON  M) 20 MEQ tablet, Take 1 tablet (20 mEq total) by mouth daily., Disp: 20 tablet, Rfl: 0   triamcinolone  cream (KENALOG ) 0.1 %, APPLY TO AFFECTED AREA TWICE DAILY IF NEEDED. NO MORE THAN 1 WEEK AT A TIME, THEN TAKE A BREAK FOR 2 WEEKS., Disp: 80 g, Rfl: 0   acetaminophen  (TYLENOL ) 650 MG CR tablet, Take 650 mg by mouth every 8 (eight) hours as needed for pain. (Patient not taking: Reported on 10/10/2023), Disp: , Rfl:    albuterol  (VENTOLIN  HFA) 108 (90 Base) MCG/ACT inhaler, Inhale 2 puffs into the lungs every 4 (four) hours as needed for wheezing or shortness of breath. (Patient not taking: Reported on 10/10/2023), Disp: 8 g, Rfl: 2   BREZTRI  AEROSPHERE 160-9-4.8 MCG/ACT AERO inhaler, INHALE 2 PUFFS TWICE DAILY (Patient not taking: Reported on 10/10/2023), Disp: 33 g, Rfl: 0   magic mouthwash (lidocaine , diphenhydrAMINE, alum & mag hydroxide) suspension, Swish and spit 5 mLs 4 (four) times daily as needed for mouth pain. (Patient not taking: Reported on 10/10/2023), Disp: 360 mL, Rfl: 0   megestrol  (MEGACE ) 20 MG tablet, Take 1 tablet (20 mg total) by mouth 2 (two) times daily. (Patient not taking: Reported on 10/10/2023), Disp: 60 tablet, Rfl: 0   metoprolol  tartrate (LOPRESSOR ) 25 MG tablet, Take 0.5 tablets (12.5 mg total) by mouth 2 (two) times daily. (Patient not taking: Reported on 10/10/2023), Disp: , Rfl:    midodrine  (PROAMATINE ) 5 MG tablet, Take 1 tablet (5 mg total) by mouth 3 (three) times daily with meals. (Patient not taking: Reported on 10/10/2023), Disp: 270 tablet, Rfl: 3   Multiple Vitamins-Minerals (MULTIVITAMIN WOMEN 50+ PO), Take 1 tablet by mouth daily.,  Disp: , Rfl:    ondansetron  (ZOFRAN -ODT) 4 MG disintegrating tablet, Take 1 tablet (4 mg total) by mouth every 8 (eight) hours as needed for nausea or vomiting. (Patient not taking: Reported on 10/10/2023), Disp: 20 tablet, Rfl: 0   pantoprazole  (PROTONIX ) 40 MG tablet, Take 1 tablet (40 mg total) by mouth daily. (Patient not taking: Reported on 10/10/2023), Disp: 90 tablet, Rfl: 1  prochlorperazine  (COMPAZINE ) 10 MG tablet, Take 1 tablet (10 mg total) by mouth every 6 (six) hours as needed for nausea or vomiting. (Patient not taking: Reported on 10/10/2023), Disp: 60 tablet, Rfl: 2   simethicone  (GAS-X) 80 MG chewable tablet, Chew 1 tablet (80 mg total) by mouth 2 (two) times a day. (Patient not taking: Reported on 10/10/2023), Disp: 60 tablet, Rfl: 2  Physical exam:  Vitals:   10/10/23 1445  BP: (!) 135/54  Pulse: 84  Resp: 18  Temp: 97.7 F (36.5 C)  TempSrc: Tympanic   Physical Exam Vitals reviewed.  Constitutional:      Comments: Frail appearing. Unaccompanied.   Cardiovascular:     Rate and Rhythm: Normal rate and regular rhythm.  Pulmonary:     Effort: Pulmonary effort is normal. No respiratory distress.  Abdominal:     General: There is no distension.     Tenderness: There is no abdominal tenderness.     Comments: No evidence of rectal bleeding. No blood in underwear.   Skin:    Coloration: Skin is not pale.     Comments: Pressure ulcer- stage 2 right buttock. Hydrocolloid dressing applied Pressure ulcer- stage 1 gluteal cleft. Meplilex applied.   Neurological:     Mental Status: She is alert and oriented to person, place, and time.  Psychiatric:        Mood and Affect: Mood normal.        Behavior: Behavior normal.         Latest Ref Rng & Units 10/10/2023    2:44 PM  CMP  Glucose 70 - 99 mg/dL 94   BUN 8 - 23 mg/dL 12   Creatinine 9.55 - 1.00 mg/dL 9.10   Sodium 864 - 854 mmol/L 133   Potassium 3.5 - 5.1 mmol/L 2.9   Chloride 98 - 111 mmol/L 101   CO2 22 - 32  mmol/L 23   Calcium  8.9 - 10.3 mg/dL 8.0   Total Protein 6.5 - 8.1 g/dL 6.1   Total Bilirubin 0.0 - 1.2 mg/dL 0.6   Alkaline Phos 38 - 126 U/L 118   AST 15 - 41 U/L 47   ALT 0 - 44 U/L 22       Latest Ref Rng & Units 10/10/2023    2:44 PM  CBC  WBC 4.0 - 10.5 K/uL 24.8   Hemoglobin 12.0 - 15.0 g/dL 89.0   Hematocrit 63.9 - 46.0 % 32.2   Platelets 150 - 400 K/uL 341    Assessment and plan- Patient is a 78 y.o. female who presents to Symptom Management Clinic for complaints of:   Bleeding- suspect secondary to pressure ulcer. No evidence of blood on rectal or underwear. On eliquis . Hmg stable/improved. Monitor.  Mucositis - secondary to chemotherapy. Resolved.  Stage IV Metastatic Pancreatic Cancer - s/p gemcitabine  - paclitaxel  Poor PO intake- unable to safely place NG tube with IR. On Megace . Encouraged her to continue taking.  Pressure ulcers- continue to improve intake. Dressings applied today. Encouraged off loading.  Hypokalemia- K 2.9. Increase oral K supplement to twice a day. New prescription sent.  Hypocalcemia- Ca 8.0. Prescription sent.   Follow up with Dr Jacobo for consideration of chemotherapy later this week.    Visit Diagnosis 1. Pressure ulcer of right buttock, stage 2 (HCC)   2. Hypokalemia   3. Hypocalcemia     Patient expressed understanding and was in agreement with this plan. She also understands that She can call clinic at  any time with any questions, concerns, or complaints.   Thank you for allowing me to participate in the care of this very pleasant patient.   Tinnie Dawn, DNP, AGNP-C, AOCNP [facility] [phone]

## 2023-10-10 NOTE — Telephone Encounter (Signed)
 I got approval from Colleen [name] to have the pat to get here 2:30 and the patient will come here. She is to put cream for the port labs. Then see NP. Niece and Colleen [name]. Trying to get here. The patient told niece that she can't do anything for herself and the work hospice but I fill that she is getting worse.

## 2023-10-10 NOTE — Telephone Encounter (Signed)
 The niece called over to say that she has have a high blood pressure reading she has been bleeding and you can see it on the toilet on a little bit on the floor and she was post to be sent to hospice she does not know what she will need to go.

## 2023-10-11 LAB — CANCER ANTIGEN 19-9: CA 19-9: 1666 U/mL — ABNORMAL HIGH (ref 0–35)

## 2023-10-11 NOTE — Addendum Note (Signed)
 Encounter addended by: Janice Lynwood BROCKS on: 10/11/2023 10:50 AM  Actions taken: Imaging Exam ended

## 2023-10-12 ENCOUNTER — Other Ambulatory Visit: Payer: Self-pay

## 2023-10-12 ENCOUNTER — Encounter: Payer: Self-pay | Admitting: Oncology

## 2023-10-12 ENCOUNTER — Telehealth: Payer: Self-pay | Admitting: *Deleted

## 2023-10-12 ENCOUNTER — Inpatient Hospital Stay

## 2023-10-12 ENCOUNTER — Other Ambulatory Visit: Payer: Self-pay | Admitting: *Deleted

## 2023-10-12 ENCOUNTER — Inpatient Hospital Stay (HOSPITAL_BASED_OUTPATIENT_CLINIC_OR_DEPARTMENT_OTHER): Admitting: Oncology

## 2023-10-12 VITALS — BP 114/63 | HR 110 | Temp 97.4°F | Resp 18 | Wt 171.0 lb

## 2023-10-12 DIAGNOSIS — C259 Malignant neoplasm of pancreas, unspecified: Secondary | ICD-10-CM

## 2023-10-12 DIAGNOSIS — R350 Frequency of micturition: Secondary | ICD-10-CM

## 2023-10-12 DIAGNOSIS — E876 Hypokalemia: Secondary | ICD-10-CM

## 2023-10-12 DIAGNOSIS — Z5111 Encounter for antineoplastic chemotherapy: Secondary | ICD-10-CM | POA: Diagnosis not present

## 2023-10-12 LAB — CBC WITH DIFFERENTIAL (CANCER CENTER ONLY)
Abs Immature Granulocytes: 3.48 K/uL — ABNORMAL HIGH (ref 0.00–0.07)
Basophils Absolute: 0.1 K/uL (ref 0.0–0.1)
Basophils Relative: 0 %
Eosinophils Absolute: 0 K/uL (ref 0.0–0.5)
Eosinophils Relative: 0 %
HCT: 30.9 % — ABNORMAL LOW (ref 36.0–46.0)
Hemoglobin: 10.4 g/dL — ABNORMAL LOW (ref 12.0–15.0)
Immature Granulocytes: 15 %
Lymphocytes Relative: 14 %
Lymphs Abs: 3.2 K/uL (ref 0.7–4.0)
MCH: 30.2 pg (ref 26.0–34.0)
MCHC: 33.7 g/dL (ref 30.0–36.0)
MCV: 89.8 fL (ref 80.0–100.0)
Monocytes Absolute: 3.1 K/uL — ABNORMAL HIGH (ref 0.1–1.0)
Monocytes Relative: 13 %
Neutro Abs: 13.6 K/uL — ABNORMAL HIGH (ref 1.7–7.7)
Neutrophils Relative %: 58 %
Platelet Count: 401 K/uL — ABNORMAL HIGH (ref 150–400)
RBC: 3.44 MIL/uL — ABNORMAL LOW (ref 3.87–5.11)
RDW: 15.5 % (ref 11.5–15.5)
WBC Count: 23.5 K/uL — ABNORMAL HIGH (ref 4.0–10.5)
nRBC: 1 % — ABNORMAL HIGH (ref 0.0–0.2)

## 2023-10-12 LAB — URINALYSIS, COMPLETE (UACMP) WITH MICROSCOPIC
Bilirubin Urine: NEGATIVE
Glucose, UA: NEGATIVE mg/dL
Ketones, ur: NEGATIVE mg/dL
Nitrite: NEGATIVE
Protein, ur: 100 mg/dL — AB
RBC / HPF: >50 RBC/hpf (ref 0–5)
Specific Gravity, Urine: 1.019 (ref 1.005–1.030)
Squamous Epithelial / HPF: 0 /HPF (ref 0–5)
WBC, UA: >50 WBC/hpf (ref 0–5)
pH: 6 (ref 5.0–8.0)

## 2023-10-12 LAB — CMP (CANCER CENTER ONLY)
ALT: 18 U/L (ref 0–44)
AST: 40 U/L (ref 15–41)
Albumin: 2.4 g/dL — ABNORMAL LOW (ref 3.5–5.0)
Alkaline Phosphatase: 111 U/L (ref 38–126)
Anion gap: 9 (ref 5–15)
BUN: 11 mg/dL (ref 8–23)
CO2: 23 mmol/L (ref 22–32)
Calcium: 8.2 mg/dL — ABNORMAL LOW (ref 8.9–10.3)
Chloride: 102 mmol/L (ref 98–111)
Creatinine: 1.01 mg/dL — ABNORMAL HIGH (ref 0.44–1.00)
GFR, Estimated: 57 mL/min — ABNORMAL LOW (ref >60)
Glucose, Bld: 87 mg/dL (ref 70–99)
Potassium: 3 mmol/L — ABNORMAL LOW (ref 3.5–5.1)
Sodium: 134 mmol/L — ABNORMAL LOW (ref 135–145)
Total Bilirubin: 0.8 mg/dL (ref 0.0–1.2)
Total Protein: 6.1 g/dL — ABNORMAL LOW (ref 6.5–8.1)

## 2023-10-12 MED ORDER — POTASSIUM CHLORIDE IN NACL 20-0.9 MEQ/L-% IV SOLN
INTRAVENOUS | Status: DC
  Filled 2023-10-12: qty 1000

## 2023-10-12 MED ORDER — SULFAMETHOXAZOLE-TRIMETHOPRIM 800-160 MG PO TABS
1.0000 | ORAL_TABLET | Freq: Two times a day (BID) | ORAL | 0 refills | Status: DC
Start: 2023-10-12 — End: 2023-10-30

## 2023-10-12 NOTE — Progress Notes (Signed)
 [facility]  [phone] [phone]  ID: Colleen [name] OB: [birth year]  MR#: [id number]  CSN#:[id number]  Patient Care Team: Leavy Mole, PA-C as PCP - General (Family Medicine) Darliss Rogue, MD as PCP - Cardiology (Cardiology) Dave Agent, MD as Consulting Physician (Rheumatology) Emilio Garrie BRAVO, RN (Inactive) as Registered Nurse Ceola Tereasa PARAS, MD as Referring Physician (Oncology) Therisa Bi, MD as Consulting Physician (Gastroenterology) Maurie Rayfield BIRCH, RN as Oncology Nurse Navigator Jacobo, Evalene PARAS, MD as Consulting Physician (Oncology)  CHIEF COMPLAINT: Stage IV pancreatic adenocarcinoma.  INTERVAL HISTORY: Patient returns to clinic today for further evaluation and hospital follow-up.  She feels improved since discharge, but still has increased weakness and fatigue.  She has some mild burning with urination, but otherwise feels well.  She has no neurologic complaints.  Her appetite has improved and she no longer has dysphagia.  She has no chest pain, shortness of breath, cough, or hemoptysis.  She has no abdominal pain.  She denies any nausea, vomiting, constipation, or diarrhea.  Patient offers no further specific complaints today.  REVIEW OF SYSTEMS:   Review of Systems  Constitutional:  Positive for malaise/fatigue. Negative for fever and weight loss.  Respiratory: Negative.  Negative for cough, hemoptysis and shortness of breath.   Cardiovascular: Negative.  Negative for chest pain and leg swelling.  Gastrointestinal: Negative.  Negative for abdominal pain.  Genitourinary:  Positive for dysuria.  Musculoskeletal: Negative.  Negative for back pain.  Skin: Negative.  Negative for rash.  Neurological:  Positive for weakness. Negative for dizziness, focal weakness and headaches.  Psychiatric/Behavioral: Negative.  The patient is not nervous/anxious.     As per HPI. Otherwise, a complete review of systems is  negative.  PAST MEDICAL HISTORY: Past Medical History:  Diagnosis Date   Asthma    Cataract    COPD (chronic obstructive pulmonary disease) (HCC)    GERD (gastroesophageal reflux disease)    Gout    History of pulmonary embolus (PE)    Hyperlipidemia    Hypertension    Osteoarthrosis    Prediabetes     PAST SURGICAL HISTORY: Past Surgical History:  Procedure Laterality Date   BACK SURGERY     BREAST BIOPSY Left 04/08/2014   intraductal papilloma 5 mm   COLONOSCOPY WITH PROPOFOL  N/A 04/23/2020   Procedure: COLONOSCOPY WITH PROPOFOL ;  Surgeon: Janalyn Keene NOVAK, MD;  Location: [facility] ENDOSCOPY;  Service: Endoscopy;  Laterality: N/A;   ERCP N/A 08/05/2023   Procedure: ERCP, WITH INTERVENTION IF INDICATED;  Surgeon: Jinny Carmine, MD;  Location: [facility] ENDOSCOPY;  Service: Endoscopy;  Laterality: N/A;   ESOPHAGOGASTRODUODENOSCOPY (EGD) WITH PROPOFOL  N/A 04/23/2020   Procedure: ESOPHAGOGASTRODUODENOSCOPY (EGD) WITH PROPOFOL ;  Surgeon: Janalyn Keene NOVAK, MD;  Location: [facility] ENDOSCOPY;  Service: Endoscopy;  Laterality: N/A;   GIVENS CAPSULE STUDY N/A 01/28/2022   Procedure: GIVENS CAPSULE STUDY;  Surgeon: Therisa Bi, MD;  Location: [facility] ENDOSCOPY;  Service: Gastroenterology;  Laterality: N/A;   IR IMAGING GUIDED PORT INSERTION  09/12/2023   IR INT EXT BILIARY DRAIN WITH CHOLANGIOGRAM  08/08/2023   IR RADIOLOGIST EVAL & MGMT  09/22/2023   IR REMOVAL BILIARY DRAIN  09/22/2023   LUMBAR LAMINECTOMY     TONSILLECTOMY      FAMILY HISTORY: Family History  Problem Relation Age of Onset   Diabetes Mother    Lung cancer Father    Diabetes Sister    Congestive Heart Failure Sister    Glaucoma Sister    Breast cancer  Maternal Aunt     ADVANCED DIRECTIVES (Y/N):  N  HEALTH MAINTENANCE: Social History   Tobacco Use   Smoking status: Former    Current packs/day: 0.00    Average packs/day: 1.5 packs/day for 20.0 years (30.0 ttl pk-yrs)    Types: Cigarettes    Start date: 11/13/1988     Quit date: 11/13/2008    Years since quitting: 14.9   Smokeless tobacco: Never   Tobacco comments:    smoking cessation materials not required  Vaping Use   Vaping status: Never Used  Substance Use Topics   Alcohol use: Not Currently   Drug use: No     Colonoscopy:  PAP:  Bone density:  Lipid panel:  Allergies  Allergen Reactions   Fentanyl  Swelling    It messes with her heart.    Nsaids     Other Reaction(s): Not available   Oxycodone  Swelling    Current Outpatient Medications  Medication Sig Dispense Refill   allopurinol  (ZYLOPRIM ) 100 MG tablet Take 2 tablets (200 mg total) by mouth daily. 180 tablet 1   apixaban  (ELIQUIS ) 2.5 MG TABS tablet Take 2 tablets (5 mg total) by mouth 2 (two) times daily.     Blood Pressure Monitoring (ADULT BLOOD PRESSURE CUFF LG) KIT 1 each by Does not apply route daily. 1 kit 0   calcium  carbonate (CALCIUM  600) 600 MG TABS tablet Take 2 tablets (1,200 mg total) by mouth daily with breakfast. 60 tablet 2   cholecalciferol (VITAMIN D3) 25 MCG (1000 UNIT) tablet Take 1,000 Units by mouth daily.     lidocaine -prilocaine  (EMLA ) cream Apply to affected area once 30 g 3   Multiple Vitamins-Minerals (MULTIVITAMIN WOMEN 50+ PO) Take 1 tablet by mouth daily.     ondansetron  (ZOFRAN ) 8 MG tablet Take 1 tablet (8 mg total) by mouth every 8 (eight) hours as needed for nausea or vomiting. 60 tablet 2   potassium chloride  SA (KLOR-CON  M) 20 MEQ tablet Take 1 tablet (20 mEq total) by mouth 2 (two) times daily. 60 tablet 1   triamcinolone  cream (KENALOG ) 0.1 % APPLY TO AFFECTED AREA TWICE DAILY IF NEEDED. NO MORE THAN 1 WEEK AT A TIME, THEN TAKE A BREAK FOR 2 WEEKS. 80 g 0   acetaminophen  (TYLENOL ) 650 MG CR tablet Take 650 mg by mouth every 8 (eight) hours as needed for pain. (Patient not taking: Reported on 10/12/2023)     albuterol  (VENTOLIN  HFA) 108 (90 Base) MCG/ACT inhaler Inhale 2 puffs into the lungs every 4 (four) hours as needed for wheezing or  shortness of breath. (Patient not taking: Reported on 10/12/2023) 8 g 2   BREZTRI  AEROSPHERE 160-9-4.8 MCG/ACT AERO inhaler INHALE 2 PUFFS TWICE DAILY (Patient not taking: Reported on 10/12/2023) 33 g 0   magic mouthwash (lidocaine , diphenhydrAMINE, alum & mag hydroxide) suspension Swish and spit 5 mLs 4 (four) times daily as needed for mouth pain. (Patient not taking: Reported on 10/12/2023) 360 mL 0   megestrol  (MEGACE ) 20 MG tablet Take 1 tablet (20 mg total) by mouth 2 (two) times daily. (Patient not taking: Reported on 10/12/2023) 60 tablet 0   metoprolol  tartrate (LOPRESSOR ) 25 MG tablet Take 0.5 tablets (12.5 mg total) by mouth 2 (two) times daily. (Patient not taking: Reported on 10/12/2023)     midodrine  (PROAMATINE ) 5 MG tablet Take 1 tablet (5 mg total) by mouth 3 (three) times daily with meals. (Patient not taking: Reported on 10/12/2023) 270 tablet 3   ondansetron  (ZOFRAN -ODT)  4 MG disintegrating tablet Take 1 tablet (4 mg total) by mouth every 8 (eight) hours as needed for nausea or vomiting. (Patient not taking: Reported on 10/12/2023) 20 tablet 0   pantoprazole  (PROTONIX ) 40 MG tablet Take 1 tablet (40 mg total) by mouth daily. (Patient not taking: Reported on 10/12/2023) 90 tablet 1   prochlorperazine  (COMPAZINE ) 10 MG tablet Take 1 tablet (10 mg total) by mouth every 6 (six) hours as needed for nausea or vomiting. (Patient not taking: Reported on 10/12/2023) 60 tablet 2   simethicone  (GAS-X) 80 MG chewable tablet Chew 1 tablet (80 mg total) by mouth 2 (two) times a day. (Patient not taking: Reported on 10/12/2023) 60 tablet 2   No current facility-administered medications for this visit.   Facility-Administered Medications Ordered in Other Visits  Medication Dose Route Frequency Provider Last Rate Last Admin   0.9 % NaCl with KCl 20 mEq/ L  infusion   Intravenous Continuous Jacobo Evalene PARAS, MD        OBJECTIVE: Vitals:   10/12/23 0901  BP: 114/63  Pulse: (!) 110  Resp: 18  Temp:  (!) 97.4 F (36.3 C)  SpO2: 100%     Body mass index is 29.35 kg/m.    ECOG FS:1 - Symptomatic but completely ambulatory  General: Well-developed, well-nourished, no acute distress. Eyes: Pink conjunctiva, anicteric sclera. HEENT: Normocephalic, moist mucous membranes. Lungs: No audible wheezing or coughing. Heart: Regular rate and rhythm. Abdomen: Soft, nontender, no obvious distention. Musculoskeletal: No edema, cyanosis, or clubbing. Neuro: Alert, answering all questions appropriately. Cranial nerves grossly intact. Skin: No rashes or petechiae noted. Psych: Normal affect.  LAB RESULTS:  Lab Results  Component Value Date   NA 134 (L) 10/12/2023   K 3.0 (L) 10/12/2023   CL 102 10/12/2023   CO2 23 10/12/2023   GLUCOSE 87 10/12/2023   BUN 11 10/12/2023   CREATININE 1.01 (H) 10/12/2023   CALCIUM  8.2 (L) 10/12/2023   PROT 6.1 (L) 10/12/2023   ALBUMIN 2.4 (L) 10/12/2023   AST 40 10/12/2023   ALT 18 10/12/2023   ALKPHOS 111 10/12/2023   BILITOT 0.8 10/12/2023   GFRNONAA 57 (L) 10/12/2023   GFRAA 58 (L) 02/28/2020    Lab Results  Component Value Date   WBC 23.5 (H) 10/12/2023   NEUTROABS 13.6 (H) 10/12/2023   HGB 10.4 (L) 10/12/2023   HCT 30.9 (L) 10/12/2023   MCV 89.8 10/12/2023   PLT 401 (H) 10/12/2023     STUDIES: DG Naso G Tube Plc W/Fl W/Rad Result Date: 10/05/2023 INDICATION: Moderate malnutrition with history of pancreatic cancer. History of gastroplasty and possible paraesophageal hernia. Recently with worsening dysphagia and mucositis. Request for NGT placement for additional nutrition. EXAM: NASO G TUBE PLACEMENT WITH FL AND WITH RAD FLUOROSCOPY TIME:  Radiation Exposure Index (as provided by the fluoroscopic device): 12.3 mGy Kerma COMPLICATIONS: None immediate PROCEDURE: The Dobhoff tube was lubricated and inserted into the right nostril. Under intermittent fluoroscopic guidance, the Dobhoff tube was advanced down to the lower esophageal sphincter. Several  attempts were made to advance the catheter through the sphincter and into the stomach; however, each time was met with resistance causing the tubing to curl in the oropharynx. Dr. Marcelino was called into the room for additional assistance, but all attempts to advance the tube were met with resistance. Given that the catheter could not be advanced safely into the stomach, the decision was made to remove the tube altogether vs leaving in place and risking retraction  of the catheter or aspiration with feedings. IMPRESSION: Given history of gastroplasty and questionable paraesophageal hernia, complicated anatomy at the level of the LES and gastric cardia are difficult to navigate and the risk of advancing tube against high resistance was deemed greater than the benefit of placement. Unsuccessful fluoroscopic guided placement of Dobhoff tube. No tube placed. Consider evaluating for other forms of nutrition supplementation at this time. This exam was performed by Sherrilee Bal, PA-C, and was supervised and interpreted by Dr. Harrietta Sherry. Electronically Signed   By: Harrietta Sherry M.D.   On: 10/05/2023 11:06   ECHOCARDIOGRAM COMPLETE Result Date: 10/03/2023    ECHOCARDIOGRAM REPORT   Patient Name:   Colleen [name] Date of Exam: 10/03/2023 Medical Rec #:  [id number]      Height:       64.0 in Accession #:    [id number]     Weight:       176.1 lb Date of Birth:  [birth year]      BSA:          1.854 m Patient Age:    78 years       BP:           106/66 mmHg Patient Gender: F              HR:           118 bpm. Exam Location:  [facility] Procedure: 2D Echo, Cardiac Doppler and Color Doppler (Both Spectral and Color            Flow Doppler were utilized during procedure). Indications:     Atrial Fibrillation I48.91  History:         Patient has prior history of Echocardiogram examinations, most                  recent 04/13/2019. Arrythmias:Atrial Fibrillation.  Sonographer:     Ashley McNeely-Sloane Referring Phys:  [id number]  [facility] Diagnosing Phys: Deatrice Cage MD IMPRESSIONS  1. Left ventricular ejection fraction, by estimation, is 55 to 60%. The left ventricle has normal function. The left ventricle has no regional wall motion abnormalities. Left ventricular diastolic parameters are indeterminate.  2. Right ventricular systolic function is normal. The right ventricular size is normal. There is mildly elevated pulmonary artery systolic pressure.  3. Left atrial size was mildly dilated.  4. Right atrial size was mildly dilated.  5. The mitral valve is normal in structure. Mild mitral valve regurgitation. No evidence of mitral stenosis.  6. Tricuspid valve regurgitation is moderate.  7. The aortic valve is normal in structure. Aortic valve regurgitation is not visualized. No aortic stenosis is present.  8. The inferior vena cava is dilated in size with >50% respiratory variability, suggesting right atrial pressure of 8 mmHg. FINDINGS  Left Ventricle: Left ventricular ejection fraction, by estimation, is 55 to 60%. The left ventricle has normal function. The left ventricle has no regional wall motion abnormalities. The left ventricular internal cavity size was normal in size. There is  no left ventricular hypertrophy. Left ventricular diastolic parameters are indeterminate. Right Ventricle: The right ventricular size is normal. No increase in right ventricular wall thickness. Right ventricular systolic function is normal. There is mildly elevated pulmonary artery systolic pressure. The tricuspid regurgitant velocity is 3.00  m/s, and with an assumed right atrial pressure of 8 mmHg, the estimated right ventricular systolic pressure is 44.0 mmHg. Left Atrium: Left atrial size was mildly dilated. Right Atrium: Right atrial size was  mildly dilated. Pericardium: There is no evidence of pericardial effusion. Mitral Valve: The mitral valve is normal in structure. Mild mitral valve regurgitation. No evidence of mitral valve stenosis.  MV peak gradient, 3.5 mmHg. The mean mitral valve gradient is 2.0 mmHg. Tricuspid Valve: The tricuspid valve is normal in structure. Tricuspid valve regurgitation is moderate . No evidence of tricuspid stenosis. Aortic Valve: The aortic valve is normal in structure. Aortic valve regurgitation is not visualized. No aortic stenosis is present. Aortic valve mean gradient measures 3.0 mmHg. Aortic valve peak gradient measures 4.9 mmHg. Aortic valve area, by VTI measures 1.59 cm. Pulmonic Valve: The pulmonic valve was normal in structure. Pulmonic valve regurgitation is mild. No evidence of pulmonic stenosis. Aorta: The aortic root is normal in size and structure. Venous: The inferior vena cava is dilated in size with greater than 50% respiratory variability, suggesting right atrial pressure of 8 mmHg. IAS/Shunts: No atrial level shunt detected by color flow Doppler.  LEFT VENTRICLE PLAX 2D LVIDd:         4.70 cm LVIDs:         3.20 cm LV PW:         1.30 cm LV IVS:        0.90 cm LVOT diam:     1.75 cm LV SV:         35 LV SV Index:   19 LVOT Area:     2.41 cm  LV Volumes (MOD) LV vol d, MOD A2C: 59.9 ml LV vol d, MOD A4C: 67.0 ml LV vol s, MOD A2C: 31.0 ml LV vol s, MOD A4C: 32.0 ml LV SV MOD A2C:     28.9 ml LV SV MOD A4C:     67.0 ml LV SV MOD BP:      35.3 ml RIGHT VENTRICLE RV S prime:     9.36 cm/s LEFT ATRIUM           Index        RIGHT ATRIUM           Index LA diam:      3.60 cm 1.94 cm/m   RA Area:     24.70 cm LA Vol (A4C): 84.8 ml 45.75 ml/m  RA Volume:   82.80 ml  44.67 ml/m  AORTIC VALVE                    PULMONIC VALVE AV Area (Vmax):    1.81 cm     PV Vmax:        1.17 m/s AV Area (Vmean):   1.70 cm     PV Vmean:       80.100 cm/s AV Area (VTI):     1.59 cm     PV VTI:         0.248 m AV Vmax:           111.00 cm/s  PV Peak grad:   5.5 mmHg AV Vmean:          76.800 cm/s  PV Mean grad:   3.0 mmHg AV VTI:            0.220 m      RVOT Peak grad: 2 mmHg AV Peak Grad:      4.9 mmHg AV Mean Grad:       3.0 mmHg LVOT Vmax:         83.60 cm/s LVOT Vmean:        54.400 cm/s LVOT  VTI:          0.145 m LVOT/AV VTI ratio: 0.66  AORTA Ao Root diam: 2.90 cm Ao Asc diam:  3.00 cm MITRAL VALVE               TRICUSPID VALVE MV Area (PHT): 3.19 cm    TR Peak grad:   36.0 mmHg MV Area VTI:   1.75 cm    TR Mean grad:   27.0 mmHg MV Peak grad:  3.5 mmHg    TR Vmax:        300.00 cm/s MV Mean grad:  2.0 mmHg    TR Vmean:       255.0 cm/s MV Vmax:       0.93 m/s MV Vmean:      61.4 cm/s   SHUNTS MV Decel Time: 238 msec    Systemic VTI:  0.14 m MV E velocity: 72.10 cm/s  Systemic Diam: 1.75 cm MV A velocity: 47.70 cm/s  Pulmonic VTI:  0.151 m MV E/A ratio:  1.51 Deatrice Cage MD Electronically signed by Deatrice Cage MD Signature Date/Time: 10/03/2023/12:29:40 PM    Final    IR REMOVAL BILIARY DRAIN Result Date: 09/22/2023 CLINICAL DATA:  Patient with a history of metastatic pancreatic cancer with obstruction of the common bile duct status post internal/external biliary drain placed 08/08/2023 in IR. Patient underwent EUS at [facility] with successful placement of stent into the common bile duct. The biliary drain has been capped since the end of July. EXAM: IR CHOLANGIOGRAM THROUGH EXISTING TUBE IR REMOVE BILIARY DRAIN COMPARISON:  IR internal/external biliary drain 08/08/2023. CONTRAST:  5 mL Isovue -300-administered via the existing percutaneous drain. FLUOROSCOPY TIME:  Radiation exposure index as provided by fluoroscopic device: 33.8 mGy air kerma TECHNIQUE: The patient was positioned supine on the fluoroscopy table. A pre-procedural spot fluoroscopic image was obtained of the left upper quadrant and the existing internal/external biliary catheter. Multiple spot fluoroscopic and radiographic images were obtained following the injection of a small amount of contrast via the existing percutaneous drainage catheter. FINDINGS: Contrast was easily injected into the drain with contrast flowing rapidly through the first part of the  catheter and then preferentially flowing into the duodenum via the stented common bile duct. The catheter was cut and an Amplatz wire was inserted. Once the wire was successfully maneuvered through the catheter the biliary drain was removed over the wire without complication. The patient tolerated the removal well. IMPRESSION: Contrast injection confirmed patency of the common bile duct stent. The biliary drain was successfully removed over an Amplatz wire. The site was covered with gauze/Tegaderm. Electronically Signed   By: Wilkie Lent M.D.   On: 09/22/2023 12:23   IR Radiologist Eval & Mgmt Result Date: 09/22/2023 EXAM: See EPIC note CHIEF COMPLAINT: See EPIC note HISTORY OF PRESENT ILLNESS: See EPIC note REVIEW OF SYSTEMS: See EPIC note PHYSICAL EXAMINATION: See EPIC note ASSESSMENT AND PLAN: See EPIC note Electronically Signed   By: Wilkie Lent M.D.   On: 09/22/2023 12:04   IR IMAGING GUIDED PORT INSERTION Result Date: 09/12/2023 INDICATION: 78 year old female with pancreatic cancer in need of durable venous access for chemotherapy. EXAM: IMPLANTED PORT A CATH PLACEMENT WITH ULTRASOUND AND FLUOROSCOPIC GUIDANCE MEDICATIONS: None. ANESTHESIA/SEDATION: Versed  2 mg IV; Fentanyl  100 mcg IV; administered intravenously by the Radiology nurse Moderate Sedation Time:  18 minutes The patient's vital signs and level of consciousness were continuously monitored during the procedure by the interventional radiology nurse under my direct supervision. FLUOROSCOPY:  Radiation exposure index: 0 mGy, reference air kerma COMPLICATIONS: None immediate. PROCEDURE: The right neck and chest was prepped with chlorhexidine , and draped in the usual sterile fashion using maximum barrier technique (cap and mask, sterile gown, sterile gloves, large sterile sheet, hand hygiene and cutaneous antiseptic). Local anesthesia was attained by infiltration with 1% lidocaine  with epinephrine . Ultrasound demonstrated patency of the  right internal jugular vein, and this was documented with an image. Under real-time ultrasound guidance, this vein was accessed with a 21 gauge micropuncture needle and image documentation was performed. A small dermatotomy was made at the access site with an 11 scalpel. A 0.018 wire was advanced into the SVC and the access needle exchanged for a 48F micropuncture vascular sheath. The 0.018 wire was then removed and a 0.035 wire advanced into the IVC. An appropriate location for the subcutaneous reservoir was selected below the clavicle and an incision was made through the skin and underlying soft tissues. The subcutaneous tissues were then dissected using a combination of blunt and sharp surgical technique and a pocket was formed. A Bard Clear Vue single lumen power injectable portacatheter was then tunneled through the subcutaneous tissues from the pocket to the dermatotomy and the port reservoir placed within the subcutaneous pocket. The venous access site was then serially dilated and a peel away vascular sheath placed over the wire. The wire was removed and the port catheter advanced into position under fluoroscopic guidance. The catheter tip is positioned in the superior cavoatrial junction. This was documented with a spot image. The portacatheter was then tested and found to flush and aspirate well. The port was flushed with saline followed by 100 units/mL heparinized saline. The pocket was then closed in two layers using first subdermal inverted interrupted absorbable sutures followed by a running subcuticular suture. The epidermis was then sealed with Dermabond. The dermatotomy at the venous access site was also closed with Dermabond. IMPRESSION: Successful placement of a right IJ approach Bard Clear Vue port catheter with ultrasound and fluoroscopic guidance. The catheter is ready for use. Electronically Signed   By: Wilkie Lent M.D.   On: 09/12/2023 13:50    ASSESSMENT: Stage IV pancreatic  adenocarcinoma.  PLAN:    Stage IV pancreatic adenocarcinoma: [facility]US at [facility] confirmed diagnosis.  PET scan results from August 22, 2023 reviewed independently with 3 hypermetabolic lesions in patient's liver consistent with metastatic disease.  Patient's CA 19-9 has decreased from [id number].  Given patient's neutropenia and hospitalization, we will cancel cycle 1 day 15.  Patient will return to clinic in 1 week for further evaluation and consideration of cycle 2, day 1.  Will dose reduce gemcitabine  at that time.   Hyperbilirubinemia: Resolved.  External drain has been removed.   Hyponatremia: Improved.  Patient's sodium level is 134.   Hypokalemia: Potassium level was 3.0 today.  Proceed with 1 L of IV fluids and 20 mEq IV potassium. Leukocytosis: Likely reactive.  Patient received Granix  while in the hospital. Anemia: Hemoglobin mildly improved to 10.4, monitor. Thrombocytosis: Likely reactive.  Patient expressed understanding and was in agreement with this plan. She also understands that She can call clinic at any time with any questions, concerns, or complaints.    Cancer Staging  Primary pancreatic cancer with metastasis to other site [facility]) Staging form: Exocrine Pancreas, AJCC 8th Edition - Clinical stage from 09/07/2023: Stage IV (cT2, cN0, cM1) - Signed by Jacobo Evalene PARAS, MD on 09/07/2023 Stage prefix: Initial diagnosis Total positive nodes: 0   Evalene  JINNY Reusing, MD   10/12/2023 9:55 AM

## 2023-10-12 NOTE — Progress Notes (Signed)
 Nutrition Follow-up:  Patient with pancreatic adenocarcinoma.  Hospitalization noted.  No treatment today, receiving potassium  Met with patient and sister during infusion.  Reports that her mouth is better.  Appetite is still poor.  Taste is off.  Ate few peanut butter crackers this am.  Took few bites of whopper yesterday.  Ate chicken thigh and few bites of pinto beans day before and ate pecan sandies cookies.  Drinking chocolate ensure plus.  Says vanilla and strawberry make her have diarrhea but the chocolate does not.      Medications: potassium chloride , zofran , MVI, Vit D, calcium   Labs: K 3.0  Anthropometrics:   Weight 171 lb today  177 lb on 8/26 182 lb on 7/14 205 lb on 3/17    NUTRITION DIAGNOSIS: Inadequate oral intake continues    INTERVENTION:  Continue MVI daily Stressed importance of good nutrition for pressure ulcer healing.  Reviewed foods rich in protein. Encouraged ensure plus 2-3 times a day.  Patient concerned about blood glucose.  Coupons given. Glucose within normal limits, still encourage high calorie shake Has magic mouthwash on hand for mouth sores Samples of juven given.  Recommend 2 packets a day to help with wounds/mouth sores.  Coupons given and discussed where to purchase.    MONITORING, EVALUATION, GOAL: weight trends, intake   NEXT VISIT: Tuesday, Sept 30 during infusion  Brittlyn Cloe B. Dasie SOLON, CSO, LDN Registered Dietitian [phone]

## 2023-10-12 NOTE — Telephone Encounter (Signed)
 Left vm for [facility]. No asnwer. Left vm for [facility] agency to call back. Patient d/c from hospital and has not heard back from [facility] agency. I lleft vm to request HH agency to reach out to pt's HPOA/Niece and her phone number

## 2023-10-13 ENCOUNTER — Encounter: Payer: Self-pay | Admitting: Oncology

## 2023-10-13 NOTE — Telephone Encounter (Signed)
 Brandy from [facility] returned my phone call. I explained to her that [facility] inpatient case mgmt documented that [facility] referral was discussed with [name] on 10/04/23. Leotis will inquire with [name] and follow-up on the referral. Pt's phone numbers for [facility] and home number provided to [facility] agency.

## 2023-10-14 LAB — URINE CULTURE: Culture: >=100000 — AB

## 2023-10-15 DIAGNOSIS — K1231 Oral mucositis (ulcerative) due to antineoplastic therapy: Secondary | ICD-10-CM | POA: Diagnosis not present

## 2023-10-15 DIAGNOSIS — E440 Moderate protein-calorie malnutrition: Secondary | ICD-10-CM | POA: Diagnosis not present

## 2023-10-15 DIAGNOSIS — R1310 Dysphagia, unspecified: Secondary | ICD-10-CM | POA: Diagnosis not present

## 2023-10-15 DIAGNOSIS — M109 Gout, unspecified: Secondary | ICD-10-CM | POA: Diagnosis not present

## 2023-10-15 DIAGNOSIS — Z602 Problems related to living alone: Secondary | ICD-10-CM | POA: Diagnosis not present

## 2023-10-15 DIAGNOSIS — K219 Gastro-esophageal reflux disease without esophagitis: Secondary | ICD-10-CM | POA: Diagnosis not present

## 2023-10-15 DIAGNOSIS — Z556 Problems related to health literacy: Secondary | ICD-10-CM | POA: Diagnosis not present

## 2023-10-15 DIAGNOSIS — C259 Malignant neoplasm of pancreas, unspecified: Secondary | ICD-10-CM | POA: Diagnosis not present

## 2023-10-15 DIAGNOSIS — J449 Chronic obstructive pulmonary disease, unspecified: Secondary | ICD-10-CM | POA: Diagnosis not present

## 2023-10-15 DIAGNOSIS — C7989 Secondary malignant neoplasm of other specified sites: Secondary | ICD-10-CM | POA: Diagnosis not present

## 2023-10-15 DIAGNOSIS — D61810 Antineoplastic chemotherapy induced pancytopenia: Secondary | ICD-10-CM | POA: Diagnosis not present

## 2023-10-15 DIAGNOSIS — M1990 Unspecified osteoarthritis, unspecified site: Secondary | ICD-10-CM | POA: Diagnosis not present

## 2023-10-15 DIAGNOSIS — I088 Other rheumatic multiple valve diseases: Secondary | ICD-10-CM | POA: Diagnosis not present

## 2023-10-15 DIAGNOSIS — I480 Paroxysmal atrial fibrillation: Secondary | ICD-10-CM | POA: Diagnosis not present

## 2023-10-15 DIAGNOSIS — Z9181 History of falling: Secondary | ICD-10-CM | POA: Diagnosis not present

## 2023-10-15 DIAGNOSIS — T451X5D Adverse effect of antineoplastic and immunosuppressive drugs, subsequent encounter: Secondary | ICD-10-CM | POA: Diagnosis not present

## 2023-10-15 DIAGNOSIS — I951 Orthostatic hypotension: Secondary | ICD-10-CM | POA: Diagnosis not present

## 2023-10-15 DIAGNOSIS — Z96659 Presence of unspecified artificial knee joint: Secondary | ICD-10-CM | POA: Diagnosis not present

## 2023-10-15 DIAGNOSIS — Z7901 Long term (current) use of anticoagulants: Secondary | ICD-10-CM | POA: Diagnosis not present

## 2023-10-15 DIAGNOSIS — D630 Anemia in neoplastic disease: Secondary | ICD-10-CM | POA: Diagnosis not present

## 2023-10-15 NOTE — Progress Notes (Unsigned)
 Cardiology Clinic Note   Date: 10/19/2023 ID: Nessa, Ramaker Oct 08, 1945, MRN [id number]  Primary Cardiologist:  Redell Cave, MD  Chief Complaint   Colleen [name] is a 78 y.o. female who presents to the clinic today for close follow up.   Patient Profile   Colleen [name] is followed by Dr, Cave for the history outlined below.      Past medical history significant for: Palpitations/PAF.         Onset March 2021 in the setting of PE. 14-day ZIO 06/18/2019: HR 54 to 226 bpm, average 80 bpm. Predominantly sinus rhythm.  2818 runs of SVT fastest 5 beats max rate 226, longest 24.8 seconds average rate 151 bpm.  SVT was detected within +/- 45 seconds of symptomatic patient events.  Some episodes of SVT may be possible A. tach with variable block.  Occasional PACs (2.7%).  Rare PVCs. Dyspnea. Nuclear stress test 06/04/2019: Normal, low risk study. Echo 10/03/2023: EF 55 to 60%.  No RWMA.  Indeterminate diastolic parameters.  Normal RV size/function.  Mildly elevated PA pressure, RVSP 44 mmHg.  Mild BAE.  Mild MR.  Moderate TR. Carotid artery disease. Carotid ultrasound 09/10/2018: Mild atherosclerotic disease of bilateral carotid arteries <50% bilaterally. Hypertension. Hyperlipidemia. Lipid panel 08/01/2023: LDL 89, HDL 101, TG 65, total 205.. COPD. PE. T2DM. Gout. CKD stage III. Pancreatic cancer.   In summary, patient was first evaluated by Dr. Cave on 03/30/2019 for abnormal EKG at the request of Olam Cower, PA-C.  Patient reported a long history of DOE improved with inhalers.  She reported occasional skipped beats.  EKG PCPs office demonstrated PACs.  Given patient's risk factors including stable dyspnea patient was evaluated with echo and nuclear stress test as detailed above.   In late March 2021 patient was admitted to [facility] for palpitation and found to have a PE per chest CTA.  During hospital admission she was noted to have PAF on telemetry.  She was discharged on  Eliquis .   Patient was seen in the office on 12/14/2021 for routine follow-up.  She was working on losing weight in order to be a surgical candidate for any type of orthopedic procedure.  She had lost close to 60 pounds at that point and still had more to go to be eligible for knee surgery.  BP was well-controlled at the time of her visit.    Patient was seen in the clinic for routine follow-up in March 2025.  She reported losing about 100 pounds.  She had both knees replaced and healed well from the surgery.  She continued to perform physical therapy exercises at home.  She had recently been referred to physical therapy to work on balance and gait training to be able to walk without walker or cane.  She had no cardiac complaints at the time of her visit.   Patient was seen in IR on 09/12/2023 for PAF.  She is followed by oncology for stage IV pancreatic adenocarcinoma and was referred for an outpatient Port-A-Cath placement and was noted to have an irregular rhythm with tachycardia after the procedure.  She was given IV metoprolol  5 mg x 3 with improvement in her heart rate.  Upon evaluation she was in sinus rhythm with frequent PACs.  Stat labs were ordered which revealed severe hypokalemia with a potassium of 2.9.  Patient was started on p.o. potassium 20 mEq twice daily and metoprolol  tartrate 25 mg p.o. twice daily.  She had recently been taken off  of antihypertensive medication due to relatively low blood pressure during hospitalization for decreased oral intake and volume depletion.  She improved with IV fluids and blood pressure medicine were stopped (HCTZ and enalapril ).    Patient was last seen in the office by me on 09/23/2023 for follow-up.  She reported not taking metoprolol  secondary to increased lower extremity edema that resolved when she stopped it.  She was hypotensive at the time of the visit with intake BP 80/40.  Patient denied lightheadedness, dizziness, presyncope or syncope.  She  started her first chemo treatment 3 days prior.  She was started on midodrine  5 mg 3 times daily and scheduled for close follow-up.  Patient underwent hospital admission on 10/01/2023 for mucositis and inability to eat or drink.  In the ED she was afebrile with blood pressure 86/53 and tachycardic at 100 bpm.  She was started on IV fluids.  She was noted to be hypokalemic with potassium of 2.5.  Cardiology was called for sudden onset of tachycardia and she was noted to be in A-fib with RVR with ventricular rates in the 160s.  She was somewhat disoriented at the time of her evaluation.  She was started on amiodarone  infusion as it was felt she would not tolerate nodal agents secondary to hypotension. Her blood pressure was stabilized with midodrine  and she was tolerating metoprolol  25 mg twice daily while in the hospital.  She was discharged on 10/07/2023.  Patient contacted the office via MyChart on 10/13/2023 with reports that an hour after taking midodrine  she had positional dizziness and dyspnea.  Heart rate had also been elevated > 100 bpm.  It was requested she send in BP readings to understand how her blood pressure was running prior to making recommendations for taking midodrine .       History of Present Illness    Today, patient is here alone. She reports she is not taking midodrine , as it is causing lower extremity edema. She brings her wrist cuff in today. When taking it with her arm down by her side BP reading 109/80 and manual reading 88/42. When taking it with arm crossed over chest and hand resting on opposite shoulder BP reading 105/50 and manual reading 94/48. Instructed patient to perform future readings with arm across chest as it appears to be more accurate. Patient is very frustrated today, as she does not understand why she is having so many changes to her medications. She feels all of this is related to receiving fentanyl  when getting her port placed. She expressing wanting to go back to  her old medications because she did not have any problems with her BP when taking them. Had a long discussion with patient about her low BP and elevated heart rate and the need to ensure her BP does not remain low. She denies lightheadedness, dizziness, presyncope or syncope. She does report feeling off balance with ambulation.  She denies palpitations or sensation of her heart racing.    ROS: All other systems reviewed and are otherwise negative except as noted in History of Present Illness.  EKGs/Labs Reviewed    EKG Interpretation Date/Time:  Wednesday October 19 2023 08:11:17 EDT Ventricular Rate:  116 PR Interval:  176 QRS Duration:  72 QT Interval:  324 QTC Calculation: 450 R Axis:   -29  Text Interpretation: Sinus tachycardia with Premature atrial complexes Low voltage QRS Cannot rule out Anterior infarct , age undetermined When compared with ECG of 01-Oct-2023 13:34, Sinus tachycardia has replaced afib Confirmed  by Loistine Colleen [phone]) on 10/19/2023 8:16:17 AM   10/18/2023: ALT 14; AST 29; BUN 19; Creatinine 1.07; Potassium 4.6; Sodium 130   10/18/2023: Hemoglobin 10.6; WBC Count 18.1   10/02/2023: TSH 2.265    Risk Assessment/Calculations     CHA2DS2-VASc Score = 5   This indicates a 7.2% annual risk of stroke. The patient's score is based upon: CHF History: 0 HTN History: 1 Diabetes History: 1 Stroke History: 0 Vascular Disease History: 0 Age Score: 2 Gender Score: 1             Physical Exam    VS:  BP (!) 94/44   Pulse (!) 116   Ht 5' 4 (1.626 m)   Wt 166 lb 3.2 oz (75.4 kg)   BMI 28.53 kg/m  , BMI Body mass index is 28.53 kg/m.  GEN: Well nourished, well developed, in no acute distress. Neck: No JVD or carotid bruits. Cardiac:  RRR.  Frequent extrasystole. No murmur. No rubs or gallops.   Respiratory:  Respirations regular and unlabored. Clear to auscultation without rales, wheezing or rhonchi. GI: Soft, nontender,  nondistended. Extremities: Radials/DP/PT 2+ and equal bilaterally. No clubbing or cyanosis. No edema.  Skin: Warm and dry, no rash. Neuro: Strength intact.  Assessment & Plan   Palpitations/PAF Onset of PAF March 2021 in the setting of PE.  14-day ZIO May 2021 demonstrated predominantly sinus rhythm with runs of symptomatic SVT.  Recently had a run of A-fib in IR after undergoing Port-A-Cath placement.  Patient had a run of A-fib with RVR during hospital admission for mucositis.  She was initially started on amiodarone  infusion.  She was discharged on midodrine  for blood pressure support and metoprolol  tartrate for rate control.  No spontaneous bleeding concerns. Patient denies palpitations or sensation of her heart racing. EKG shows sinus tachycardia with PACs 116 bpm. HR down to 101 during visit. Patient reports she is taking metoprolol .  - Continue metoprolol , Eliquis .   Hypokalemia Patient with recent run of A-fib after having Port-A-Cath placed.  Stat labs demonstrated hypokalemia with potassium 2.9.  Patient was found to be hypokalemic during hospital admission in early September 2025.  Repeat labs on 10/12/2023 showed persistent hypokalemia with potassium 3.  Patient was increased back to 20 mEq 2 times a day. Last potassium 9/23 4.6.  - Continue potassium.   Hypotension BP today 94/44, 88/42.  HR 116. She brings her wrist cuff in today. When taking it with her arm down by her side BP reading 109/80 and manual reading 88/42. When taking it with arm crossed over chest and hand resting on opposite shoulder BP reading 105/50 and manual reading 94/48. Patient reports midodrine  is causing lower extremity edema so she is not taking it and edema resolved. Discussed the importance of bringing up BP. She denies lightheadedness or dizziness. She reports feeling off balance with ambulation. She walks with a cane. She agrees to try midodrine  twice a day. She can hold midodrine  if SBP >100 and DBP >50. She  expresses understanding.  - Midodrine  5 mg bid for SBP <100 and DBP <50.   Disposition: Try midodrine  bid for SBP <100 and DBP <50. Return in 4-5 weeks for close follow up.          Signed, Colleen HERO. Kyeshia Zinn, DNP, NP-C

## 2023-10-18 ENCOUNTER — Ambulatory Visit: Admitting: Oncology

## 2023-10-18 ENCOUNTER — Inpatient Hospital Stay (HOSPITAL_BASED_OUTPATIENT_CLINIC_OR_DEPARTMENT_OTHER): Admitting: Oncology

## 2023-10-18 ENCOUNTER — Encounter: Payer: Self-pay | Admitting: Oncology

## 2023-10-18 ENCOUNTER — Inpatient Hospital Stay

## 2023-10-18 ENCOUNTER — Encounter: Payer: Self-pay | Admitting: Family Medicine

## 2023-10-18 ENCOUNTER — Telehealth: Payer: Self-pay

## 2023-10-18 ENCOUNTER — Other Ambulatory Visit

## 2023-10-18 ENCOUNTER — Other Ambulatory Visit: Payer: Self-pay

## 2023-10-18 ENCOUNTER — Ambulatory Visit

## 2023-10-18 VITALS — BP 95/47 | HR 59

## 2023-10-18 VITALS — BP 102/54 | HR 65 | Temp 97.8°F | Resp 16 | Ht 64.0 in | Wt 172.0 lb

## 2023-10-18 DIAGNOSIS — C259 Malignant neoplasm of pancreas, unspecified: Secondary | ICD-10-CM

## 2023-10-18 DIAGNOSIS — Z5111 Encounter for antineoplastic chemotherapy: Secondary | ICD-10-CM | POA: Diagnosis not present

## 2023-10-18 LAB — CBC WITH DIFFERENTIAL (CANCER CENTER ONLY)
Abs Immature Granulocytes: 0.45 K/uL — ABNORMAL HIGH (ref 0.00–0.07)
Basophils Absolute: 0.3 K/uL — ABNORMAL HIGH (ref 0.0–0.1)
Basophils Relative: 1 %
Eosinophils Absolute: 0 K/uL (ref 0.0–0.5)
Eosinophils Relative: 0 %
HCT: 31.5 % — ABNORMAL LOW (ref 36.0–46.0)
Hemoglobin: 10.6 g/dL — ABNORMAL LOW (ref 12.0–15.0)
Immature Granulocytes: 3 %
Lymphocytes Relative: 14 %
Lymphs Abs: 2.6 K/uL (ref 0.7–4.0)
MCH: 30.3 pg (ref 26.0–34.0)
MCHC: 33.7 g/dL (ref 30.0–36.0)
MCV: 90 fL (ref 80.0–100.0)
Monocytes Absolute: 1.2 K/uL — ABNORMAL HIGH (ref 0.1–1.0)
Monocytes Relative: 7 %
Neutro Abs: 13.6 K/uL — ABNORMAL HIGH (ref 1.7–7.7)
Neutrophils Relative %: 75 %
Platelet Count: 477 K/uL — ABNORMAL HIGH (ref 150–400)
RBC: 3.5 MIL/uL — ABNORMAL LOW (ref 3.87–5.11)
RDW: 17.1 % — ABNORMAL HIGH (ref 11.5–15.5)
WBC Count: 18.1 K/uL — ABNORMAL HIGH (ref 4.0–10.5)
nRBC: 0.1 % (ref 0.0–0.2)

## 2023-10-18 LAB — URINALYSIS, COMPLETE (UACMP) WITH MICROSCOPIC
Bacteria, UA: NONE SEEN
Bilirubin Urine: NEGATIVE
Glucose, UA: NEGATIVE mg/dL
Hgb urine dipstick: NEGATIVE
Ketones, ur: NEGATIVE mg/dL
Leukocytes,Ua: NEGATIVE
Nitrite: NEGATIVE
Protein, ur: NEGATIVE mg/dL
Specific Gravity, Urine: 1.017 (ref 1.005–1.030)
pH: 6 (ref 5.0–8.0)

## 2023-10-18 LAB — CMP (CANCER CENTER ONLY)
ALT: 14 U/L (ref 0–44)
AST: 29 U/L (ref 15–41)
Albumin: 2.5 g/dL — ABNORMAL LOW (ref 3.5–5.0)
Alkaline Phosphatase: 105 U/L (ref 38–126)
Anion gap: 6 (ref 5–15)
BUN: 19 mg/dL (ref 8–23)
CO2: 20 mmol/L — ABNORMAL LOW (ref 22–32)
Calcium: 8.5 mg/dL — ABNORMAL LOW (ref 8.9–10.3)
Chloride: 104 mmol/L (ref 98–111)
Creatinine: 1.07 mg/dL — ABNORMAL HIGH (ref 0.44–1.00)
GFR, Estimated: 53 mL/min — ABNORMAL LOW (ref >60)
Glucose, Bld: 105 mg/dL — ABNORMAL HIGH (ref 70–99)
Potassium: 4.6 mmol/L (ref 3.5–5.1)
Sodium: 130 mmol/L — ABNORMAL LOW (ref 135–145)
Total Bilirubin: 0.6 mg/dL (ref 0.0–1.2)
Total Protein: 6.1 g/dL — ABNORMAL LOW (ref 6.5–8.1)

## 2023-10-18 MED ORDER — SODIUM CHLORIDE 0.9 % IV SOLN
INTRAVENOUS | Status: DC
  Filled 2023-10-18: qty 250

## 2023-10-18 MED ORDER — PACLITAXEL PROTEIN-BOUND CHEMO INJECTION 100 MG
100.0000 mg/m2 | Freq: Once | INTRAVENOUS | Status: AC
  Administered 2023-10-18: 200 mg via INTRAVENOUS
  Filled 2023-10-18: qty 40

## 2023-10-18 MED ORDER — MEGESTROL ACETATE 20 MG PO TABS: 20.0000 mg | ORAL_TABLET | Freq: Two times a day (BID) | ORAL | 0 refills | Status: DC

## 2023-10-18 MED ORDER — PROCHLORPERAZINE MALEATE 10 MG PO TABS
10.0000 mg | ORAL_TABLET | Freq: Once | ORAL | Status: AC
  Administered 2023-10-18: 10 mg via ORAL
  Filled 2023-10-18: qty 1

## 2023-10-18 MED ORDER — SODIUM CHLORIDE 0.9 % IV SOLN
800.0000 mg/m2 | Freq: Once | INTRAVENOUS | Status: AC
  Administered 2023-10-18: 1520 mg via INTRAVENOUS
  Filled 2023-10-18: qty 39.98

## 2023-10-18 NOTE — Telephone Encounter (Signed)
 Left voicemail inquiring on fax number and info needed for dressing RX for bedsore to send to homehealth

## 2023-10-18 NOTE — Progress Notes (Signed)
 She is needing a refill on the Megestrol  and the Silfamethoxazole. When she was diagnosed with the UTI recently she has been noticing some bleeding either from her vagina or her rectum.

## 2023-10-18 NOTE — Progress Notes (Signed)
 [facility]  [phone] [phone]  ID: Colleen [name] OB: 1946-01-10  MR#: [id number]  CSN#:[id number]  Patient Care Team: Leavy Mole, PA-C as PCP - General (Family Medicine) Darliss Rogue, MD as PCP - Cardiology (Cardiology) Dave Agent, MD as Consulting Physician (Rheumatology) Emilio Garrie BRAVO, RN (Inactive) as Registered Nurse Ceola Tereasa PARAS, MD as Referring Physician (Oncology) Therisa Bi, MD as Consulting Physician (Gastroenterology) Maurie Rayfield BIRCH, RN as Oncology Nurse Navigator Jacobo, Evalene PARAS, MD as Consulting Physician (Oncology)  CHIEF COMPLAINT: Stage IV pancreatic adenocarcinoma.  INTERVAL HISTORY: Patient returns to clinic today for further evaluation and reconsideration of cycle 2, day 1 of gemcitabine  and Abraxane .  She continues to have weakness and fatigue, but this is improving.  She has noticed some blood in the toilet, but is unsure if it is in her stool or urine.  She otherwise feels well.  She has no neurologic complaints.  Her appetite has improved and she no longer has dysphagia.  She has no chest pain, shortness of breath, cough, or hemoptysis.  She has no abdominal pain.  She denies any nausea, vomiting, constipation, or diarrhea.  Patient offers no further specific complaints today.  REVIEW OF SYSTEMS:   Review of Systems  Constitutional:  Positive for malaise/fatigue. Negative for fever and weight loss.  Respiratory: Negative.  Negative for cough, hemoptysis and shortness of breath.   Cardiovascular: Negative.  Negative for chest pain and leg swelling.  Gastrointestinal: Negative.  Negative for abdominal pain.  Genitourinary: Negative.  Negative for dysuria.  Musculoskeletal: Negative.  Negative for back pain.  Skin: Negative.  Negative for rash.  Neurological:  Positive for weakness. Negative for dizziness, focal weakness and headaches.  Psychiatric/Behavioral: Negative.  The patient is not  nervous/anxious.     As per HPI. Otherwise, a complete review of systems is negative.  PAST MEDICAL HISTORY: Past Medical History:  Diagnosis Date   Asthma    Cataract    COPD (chronic obstructive pulmonary disease) (HCC)    GERD (gastroesophageal reflux disease)    Gout    History of pulmonary embolus (PE)    Hyperlipidemia    Hypertension    Osteoarthrosis    Prediabetes     PAST SURGICAL HISTORY: Past Surgical History:  Procedure Laterality Date   BACK SURGERY     BREAST BIOPSY Left 04/08/2014   intraductal papilloma 5 mm   COLONOSCOPY WITH PROPOFOL  N/A 04/23/2020   Procedure: COLONOSCOPY WITH PROPOFOL ;  Surgeon: Janalyn Keene NOVAK, MD;  Location: [facility] ENDOSCOPY;  Service: Endoscopy;  Laterality: N/A;   ERCP N/A 08/05/2023   Procedure: ERCP, WITH INTERVENTION IF INDICATED;  Surgeon: Jinny Carmine, MD;  Location: [facility] ENDOSCOPY;  Service: Endoscopy;  Laterality: N/A;   ESOPHAGOGASTRODUODENOSCOPY (EGD) WITH PROPOFOL  N/A 04/23/2020   Procedure: ESOPHAGOGASTRODUODENOSCOPY (EGD) WITH PROPOFOL ;  Surgeon: Janalyn Keene NOVAK, MD;  Location: [facility] ENDOSCOPY;  Service: Endoscopy;  Laterality: N/A;   GIVENS CAPSULE STUDY N/A 01/28/2022   Procedure: GIVENS CAPSULE STUDY;  Surgeon: Therisa Bi, MD;  Location: [facility] ENDOSCOPY;  Service: Gastroenterology;  Laterality: N/A;   IR IMAGING GUIDED PORT INSERTION  09/12/2023   IR INT EXT BILIARY DRAIN WITH CHOLANGIOGRAM  08/08/2023   IR RADIOLOGIST EVAL & MGMT  09/22/2023   IR REMOVAL BILIARY DRAIN  09/22/2023   LUMBAR LAMINECTOMY     TONSILLECTOMY      FAMILY HISTORY: Family History  Problem Relation Age of Onset   Diabetes Mother    Lung cancer Father  Diabetes Sister    Congestive Heart Failure Sister    Glaucoma Sister    Breast cancer Maternal Aunt     ADVANCED DIRECTIVES (Y/N):  N  HEALTH MAINTENANCE: Social History   Tobacco Use   Smoking status: Former    Current packs/day: 0.00    Average packs/day: 1.5 packs/day for  20.0 years (30.0 ttl pk-yrs)    Types: Cigarettes    Start date: 11/13/1988    Quit date: 11/13/2008    Years since quitting: 14.9   Smokeless tobacco: Never   Tobacco comments:    smoking cessation materials not required  Vaping Use   Vaping status: Never Used  Substance Use Topics   Alcohol use: Not Currently   Drug use: No     Colonoscopy:  PAP:  Bone density:  Lipid panel:  Allergies  Allergen Reactions   Fentanyl  Swelling    It messes with her heart.    Nsaids     Other Reaction(s): Not available   Oxycodone  Swelling    Current Outpatient Medications  Medication Sig Dispense Refill   allopurinol  (ZYLOPRIM ) 100 MG tablet Take 2 tablets (200 mg total) by mouth daily. 180 tablet 1   apixaban  (ELIQUIS ) 2.5 MG TABS tablet Take 2 tablets (5 mg total) by mouth 2 (two) times daily.     Blood Pressure Monitoring (ADULT BLOOD PRESSURE CUFF LG) KIT 1 each by Does not apply route daily. 1 kit 0   calcium  carbonate (CALCIUM  600) 600 MG TABS tablet Take 2 tablets (1,200 mg total) by mouth daily with breakfast. 60 tablet 2   cholecalciferol (VITAMIN D3) 25 MCG (1000 UNIT) tablet Take 1,000 Units by mouth daily.     lidocaine -prilocaine  (EMLA ) cream Apply to affected area once 30 g 3   metoprolol  tartrate (LOPRESSOR ) 25 MG tablet Take 0.5 tablets (12.5 mg total) by mouth 2 (two) times daily.     Multiple Vitamins-Minerals (MULTIVITAMIN WOMEN 50+ PO) Take 1 tablet by mouth daily.     ondansetron  (ZOFRAN ) 8 MG tablet Take 1 tablet (8 mg total) by mouth every 8 (eight) hours as needed for nausea or vomiting. 60 tablet 2   potassium chloride  SA (KLOR-CON  M) 20 MEQ tablet Take 1 tablet (20 mEq total) by mouth 2 (two) times daily. 60 tablet 1   sulfamethoxazole -trimethoprim  (BACTRIM  DS) 800-160 MG tablet Take 1 tablet by mouth 2 (two) times daily. 10 tablet 0   triamcinolone  cream (KENALOG ) 0.1 % APPLY TO AFFECTED AREA TWICE DAILY IF NEEDED. NO MORE THAN 1 WEEK AT A TIME, THEN TAKE A BREAK  FOR 2 WEEKS. 80 g 0   acetaminophen  (TYLENOL ) 650 MG CR tablet Take 650 mg by mouth every 8 (eight) hours as needed for pain. (Patient not taking: Reported on 10/18/2023)     albuterol  (VENTOLIN  HFA) 108 (90 Base) MCG/ACT inhaler Inhale 2 puffs into the lungs every 4 (four) hours as needed for wheezing or shortness of breath. (Patient not taking: Reported on 10/18/2023) 8 g 2   BREZTRI  AEROSPHERE 160-9-4.8 MCG/ACT AERO inhaler INHALE 2 PUFFS TWICE DAILY (Patient not taking: Reported on 10/18/2023) 33 g 0   magic mouthwash (lidocaine , diphenhydrAMINE, alum & mag hydroxide) suspension Swish and spit 5 mLs 4 (four) times daily as needed for mouth pain. (Patient not taking: Reported on 10/18/2023) 360 mL 0   megestrol  (MEGACE ) 20 MG tablet Take 1 tablet (20 mg total) by mouth 2 (two) times daily. 60 tablet 0   midodrine  (PROAMATINE ) 5 MG tablet  Take 1 tablet (5 mg total) by mouth 3 (three) times daily with meals. (Patient not taking: Reported on 10/18/2023) 270 tablet 3   ondansetron  (ZOFRAN -ODT) 4 MG disintegrating tablet Take 1 tablet (4 mg total) by mouth every 8 (eight) hours as needed for nausea or vomiting. (Patient not taking: Reported on 10/18/2023) 20 tablet 0   pantoprazole  (PROTONIX ) 40 MG tablet Take 1 tablet (40 mg total) by mouth daily. (Patient not taking: Reported on 10/18/2023) 90 tablet 1   prochlorperazine  (COMPAZINE ) 10 MG tablet Take 1 tablet (10 mg total) by mouth every 6 (six) hours as needed for nausea or vomiting. (Patient not taking: Reported on 10/18/2023) 60 tablet 2   simethicone  (GAS-X) 80 MG chewable tablet Chew 1 tablet (80 mg total) by mouth 2 (two) times a day. (Patient not taking: Reported on 10/18/2023) 60 tablet 2   No current facility-administered medications for this visit.    OBJECTIVE: Vitals:   10/18/23 0918  BP: (!) 102/54  Pulse: 65  Resp: 16  Temp: 97.8 F (36.6 C)  SpO2: 99%     Body mass index is 29.52 kg/m.    ECOG FS:1 - Symptomatic but completely  ambulatory  General: Well-developed, well-nourished, no acute distress. Eyes: Pink conjunctiva, anicteric sclera. HEENT: Normocephalic, moist mucous membranes. Lungs: No audible wheezing or coughing. Heart: Regular rate and rhythm. Abdomen: Soft, nontender, no obvious distention. Musculoskeletal: No edema, cyanosis, or clubbing. Neuro: Alert, answering all questions appropriately. Cranial nerves grossly intact. Skin: No rashes or petechiae noted. Psych: Normal affect.  LAB RESULTS:  Lab Results  Component Value Date   NA 130 (L) 10/18/2023   K 4.6 10/18/2023   CL 104 10/18/2023   CO2 20 (L) 10/18/2023   GLUCOSE 105 (H) 10/18/2023   BUN 19 10/18/2023   CREATININE 1.07 (H) 10/18/2023   CALCIUM  8.5 (L) 10/18/2023   PROT 6.1 (L) 10/18/2023   ALBUMIN 2.5 (L) 10/18/2023   AST 29 10/18/2023   ALT 14 10/18/2023   ALKPHOS 105 10/18/2023   BILITOT 0.6 10/18/2023   GFRNONAA 53 (L) 10/18/2023   GFRAA 58 (L) 02/28/2020    Lab Results  Component Value Date   WBC 18.1 (H) 10/18/2023   NEUTROABS 13.6 (H) 10/18/2023   HGB 10.6 (L) 10/18/2023   HCT 31.5 (L) 10/18/2023   MCV 90.0 10/18/2023   PLT 477 (H) 10/18/2023     STUDIES: DG Naso G Tube Plc W/Fl W/Rad Result Date: 10/05/2023 INDICATION: Moderate malnutrition with history of pancreatic cancer. History of gastroplasty and possible paraesophageal hernia. Recently with worsening dysphagia and mucositis. Request for NGT placement for additional nutrition. EXAM: NASO G TUBE PLACEMENT WITH FL AND WITH RAD FLUOROSCOPY TIME:  Radiation Exposure Index (as provided by the fluoroscopic device): 12.3 mGy Kerma COMPLICATIONS: None immediate PROCEDURE: The Dobhoff tube was lubricated and inserted into the right nostril. Under intermittent fluoroscopic guidance, the Dobhoff tube was advanced down to the lower esophageal sphincter. Several attempts were made to advance the catheter through the sphincter and into the stomach; however, each time was  met with resistance causing the tubing to curl in the oropharynx. Dr. Marcelino was called into the room for additional assistance, but all attempts to advance the tube were met with resistance. Given that the catheter could not be advanced safely into the stomach, the decision was made to remove the tube altogether vs leaving in place and risking retraction of the catheter or aspiration with feedings. IMPRESSION: Given history of gastroplasty and questionable paraesophageal  hernia, complicated anatomy at the level of the LES and gastric cardia are difficult to navigate and the risk of advancing tube against high resistance was deemed greater than the benefit of placement. Unsuccessful fluoroscopic guided placement of Dobhoff tube. No tube placed. Consider evaluating for other forms of nutrition supplementation at this time. This exam was performed by Sherrilee Bal, PA-C, and was supervised and interpreted by Dr. Harrietta Sherry. Electronically Signed   By: Harrietta Sherry M.D.   On: 10/05/2023 11:06   ECHOCARDIOGRAM COMPLETE Result Date: 10/03/2023    ECHOCARDIOGRAM REPORT   Patient Name:   Colleen [name] Date of Exam: 10/03/2023 Medical Rec #:  [id number]      Height:       64.0 in Accession #:    [id number]     Weight:       176.1 lb Date of Birth:  Oct 26, 1945      BSA:          1.854 m Patient Age:    78 years       BP:           106/66 mmHg Patient Gender: F              HR:           118 bpm. Exam Location:  [facility] Procedure: 2D Echo, Cardiac Doppler and Color Doppler (Both Spectral and Color            Flow Doppler were utilized during procedure). Indications:     Atrial Fibrillation I48.91  History:         Patient has prior history of Echocardiogram examinations, most                  recent 04/13/2019. Arrythmias:Atrial Fibrillation.  Sonographer:     Ashley McNeely-Sloane Referring Phys:  [id number] [facility] Diagnosing Phys: Deatrice Cage MD IMPRESSIONS  1. Left ventricular ejection fraction, by  estimation, is 55 to 60%. The left ventricle has normal function. The left ventricle has no regional wall motion abnormalities. Left ventricular diastolic parameters are indeterminate.  2. Right ventricular systolic function is normal. The right ventricular size is normal. There is mildly elevated pulmonary artery systolic pressure.  3. Left atrial size was mildly dilated.  4. Right atrial size was mildly dilated.  5. The mitral valve is normal in structure. Mild mitral valve regurgitation. No evidence of mitral stenosis.  6. Tricuspid valve regurgitation is moderate.  7. The aortic valve is normal in structure. Aortic valve regurgitation is not visualized. No aortic stenosis is present.  8. The inferior vena cava is dilated in size with >50% respiratory variability, suggesting right atrial pressure of 8 mmHg. FINDINGS  Left Ventricle: Left ventricular ejection fraction, by estimation, is 55 to 60%. The left ventricle has normal function. The left ventricle has no regional wall motion abnormalities. The left ventricular internal cavity size was normal in size. There is  no left ventricular hypertrophy. Left ventricular diastolic parameters are indeterminate. Right Ventricle: The right ventricular size is normal. No increase in right ventricular wall thickness. Right ventricular systolic function is normal. There is mildly elevated pulmonary artery systolic pressure. The tricuspid regurgitant velocity is 3.00  m/s, and with an assumed right atrial pressure of 8 mmHg, the estimated right ventricular systolic pressure is 44.0 mmHg. Left Atrium: Left atrial size was mildly dilated. Right Atrium: Right atrial size was mildly dilated. Pericardium: There is no evidence of pericardial effusion. Mitral Valve: The mitral valve  is normal in structure. Mild mitral valve regurgitation. No evidence of mitral valve stenosis. MV peak gradient, 3.5 mmHg. The mean mitral valve gradient is 2.0 mmHg. Tricuspid Valve: The tricuspid  valve is normal in structure. Tricuspid valve regurgitation is moderate . No evidence of tricuspid stenosis. Aortic Valve: The aortic valve is normal in structure. Aortic valve regurgitation is not visualized. No aortic stenosis is present. Aortic valve mean gradient measures 3.0 mmHg. Aortic valve peak gradient measures 4.9 mmHg. Aortic valve area, by VTI measures 1.59 cm. Pulmonic Valve: The pulmonic valve was normal in structure. Pulmonic valve regurgitation is mild. No evidence of pulmonic stenosis. Aorta: The aortic root is normal in size and structure. Venous: The inferior vena cava is dilated in size with greater than 50% respiratory variability, suggesting right atrial pressure of 8 mmHg. IAS/Shunts: No atrial level shunt detected by color flow Doppler.  LEFT VENTRICLE PLAX 2D LVIDd:         4.70 cm LVIDs:         3.20 cm LV PW:         1.30 cm LV IVS:        0.90 cm LVOT diam:     1.75 cm LV SV:         35 LV SV Index:   19 LVOT Area:     2.41 cm  LV Volumes (MOD) LV vol d, MOD A2C: 59.9 ml LV vol d, MOD A4C: 67.0 ml LV vol s, MOD A2C: 31.0 ml LV vol s, MOD A4C: 32.0 ml LV SV MOD A2C:     28.9 ml LV SV MOD A4C:     67.0 ml LV SV MOD BP:      35.3 ml RIGHT VENTRICLE RV S prime:     9.36 cm/s LEFT ATRIUM           Index        RIGHT ATRIUM           Index LA diam:      3.60 cm 1.94 cm/m   RA Area:     24.70 cm LA Vol (A4C): 84.8 ml 45.75 ml/m  RA Volume:   82.80 ml  44.67 ml/m  AORTIC VALVE                    PULMONIC VALVE AV Area (Vmax):    1.81 cm     PV Vmax:        1.17 m/s AV Area (Vmean):   1.70 cm     PV Vmean:       80.100 cm/s AV Area (VTI):     1.59 cm     PV VTI:         0.248 m AV Vmax:           111.00 cm/s  PV Peak grad:   5.5 mmHg AV Vmean:          76.800 cm/s  PV Mean grad:   3.0 mmHg AV VTI:            0.220 m      RVOT Peak grad: 2 mmHg AV Peak Grad:      4.9 mmHg AV Mean Grad:      3.0 mmHg LVOT Vmax:         83.60 cm/s LVOT Vmean:        54.400 cm/s LVOT VTI:          0.145 m  LVOT/AV VTI ratio:  0.66  AORTA Ao Root diam: 2.90 cm Ao Asc diam:  3.00 cm MITRAL VALVE               TRICUSPID VALVE MV Area (PHT): 3.19 cm    TR Peak grad:   36.0 mmHg MV Area VTI:   1.75 cm    TR Mean grad:   27.0 mmHg MV Peak grad:  3.5 mmHg    TR Vmax:        300.00 cm/s MV Mean grad:  2.0 mmHg    TR Vmean:       255.0 cm/s MV Vmax:       0.93 m/s MV Vmean:      61.4 cm/s   SHUNTS MV Decel Time: 238 msec    Systemic VTI:  0.14 m MV E velocity: 72.10 cm/s  Systemic Diam: 1.75 cm MV A velocity: 47.70 cm/s  Pulmonic VTI:  0.151 m MV E/A ratio:  1.51 Deatrice Cage MD Electronically signed by Deatrice Cage MD Signature Date/Time: 10/03/2023/12:29:40 PM    Final    IR REMOVAL BILIARY DRAIN Result Date: 09/22/2023 CLINICAL DATA:  Patient with a history of metastatic pancreatic cancer with obstruction of the common bile duct status post internal/external biliary drain placed 08/08/2023 in IR. Patient underwent EUS at [facility] with successful placement of stent into the common bile duct. The biliary drain has been capped since the end of July. EXAM: IR CHOLANGIOGRAM THROUGH EXISTING TUBE IR REMOVE BILIARY DRAIN COMPARISON:  IR internal/external biliary drain 08/08/2023. CONTRAST:  5 mL Isovue -300-administered via the existing percutaneous drain. FLUOROSCOPY TIME:  Radiation exposure index as provided by fluoroscopic device: 33.8 mGy air kerma TECHNIQUE: The patient was positioned supine on the fluoroscopy table. A pre-procedural spot fluoroscopic image was obtained of the left upper quadrant and the existing internal/external biliary catheter. Multiple spot fluoroscopic and radiographic images were obtained following the injection of a small amount of contrast via the existing percutaneous drainage catheter. FINDINGS: Contrast was easily injected into the drain with contrast flowing rapidly through the first part of the catheter and then preferentially flowing into the duodenum via the stented common bile duct. The  catheter was cut and an Amplatz wire was inserted. Once the wire was successfully maneuvered through the catheter the biliary drain was removed over the wire without complication. The patient tolerated the removal well. IMPRESSION: Contrast injection confirmed patency of the common bile duct stent. The biliary drain was successfully removed over an Amplatz wire. The site was covered with gauze/Tegaderm. Electronically Signed   By: Wilkie Lent M.D.   On: 09/22/2023 12:23   IR Radiologist Eval & Mgmt Result Date: 09/22/2023 EXAM: See EPIC note CHIEF COMPLAINT: See EPIC note HISTORY OF PRESENT ILLNESS: See EPIC note REVIEW OF SYSTEMS: See EPIC note PHYSICAL EXAMINATION: See EPIC note ASSESSMENT AND PLAN: See EPIC note Electronically Signed   By: Wilkie Lent M.D.   On: 09/22/2023 12:04    ASSESSMENT: Stage IV pancreatic adenocarcinoma.  PLAN:    Stage IV pancreatic adenocarcinoma: EUS at [facility] confirmed diagnosis.  PET scan results from August 22, 2023 reviewed independently with 3 hypermetabolic lesions in patient's liver consistent with metastatic disease.  Patient's CA 19-9 has decreased from [id number].  Today's result is pending.  Both Abraxane  and gemcitabine  have been dose reduced.  Proceed with cycle 2, day 1 of treatment.  Patient may only be able to tolerate treatment on days 1 and 8.  Return to clinic in 1 week for further  evaluation and consideration of cycle 2, day 8.   Hyperbilirubinemia: Resolved.  External drain has been removed.   Hyponatremia: Odium level has trended down to 130.  Monitor. Hypokalemia: Resolved. Renal insufficiency: Mild, monitor. Leukocytosis: Improving.  Likely reactive. Anemia: Chronic and unchanged.  Patient's hemoglobin is 10.6.   Thrombocytosis: Likely reactive, monitor. UTI: Will repeat UA and culture today to ensure resolution. Appetite: Patient was given a refill of Megace  today.  Patient expressed understanding and was in agreement with this plan.  She also understands that She can call clinic at any time with any questions, concerns, or complaints.    Cancer Staging  Primary pancreatic cancer with metastasis to other site [facility]) Staging form: Exocrine Pancreas, AJCC 8th Edition - Clinical stage from 09/07/2023: Stage IV (cT2, cN0, cM1) - Signed by Jacobo Evalene PARAS, MD on 09/07/2023 Stage prefix: Initial diagnosis Total positive nodes: 0   Evalene PARAS Jacobo, MD   10/18/2023 9:54 AM

## 2023-10-19 ENCOUNTER — Encounter: Payer: Self-pay | Admitting: Student

## 2023-10-19 ENCOUNTER — Ambulatory Visit: Attending: Student | Admitting: Student

## 2023-10-19 ENCOUNTER — Other Ambulatory Visit: Payer: Self-pay

## 2023-10-19 VITALS — BP 94/44 | HR 116 | Ht 64.0 in | Wt 166.2 lb

## 2023-10-19 DIAGNOSIS — I9589 Other hypotension: Secondary | ICD-10-CM | POA: Diagnosis not present

## 2023-10-19 DIAGNOSIS — E876 Hypokalemia: Secondary | ICD-10-CM | POA: Diagnosis not present

## 2023-10-19 DIAGNOSIS — I480 Paroxysmal atrial fibrillation: Secondary | ICD-10-CM

## 2023-10-19 LAB — URINE CULTURE: Culture: NO GROWTH

## 2023-10-19 LAB — CANCER ANTIGEN 19-9: CA 19-9: 1979 U/mL — ABNORMAL HIGH (ref 0–35)

## 2023-10-19 MED ORDER — MIDODRINE HCL 5 MG PO TABS: 5.0000 mg | ORAL_TABLET | Freq: Two times a day (BID) | ORAL | Status: DC

## 2023-10-19 NOTE — Patient Instructions (Signed)
 Medication Instructions:  Your physician recommends the following medication changes.  Take Midodrine  5 mg 2 times daily.  Hold the next dose of Midodrine  if the Systolic BP (Top Number) is >100 (more than 100) or if the Diastolic BP (Bottom Number) is >50 (more than 50).   *If you need a refill on your cardiac medications before your next appointment, please call your pharmacy*  Lab Work: None ordered at this time  If you have labs (blood work) drawn today and your tests are completely normal, you will receive your results only by: MyChart Message (if you have MyChart) OR A paper copy in the mail If you have any lab test that is abnormal or we need to change your treatment, we will call you to review the results.  Testing/Procedures: None ordered at this time   Follow-Up: At [facility], you and your health needs are our priority.  As part of our continuing mission to provide you with exceptional heart care, our providers are all part of one team.  This team includes your primary Cardiologist (physician) and Advanced Practice Providers or APPs (Physician Assistants and Nurse Practitioners) who all work together to provide you with the care you need, when you need it.  Your next appointment:   3 - 4  week(s)  Provider:   Redell Cave, MD    We recommend signing up for the patient portal called MyChart.  Sign up information is provided on this After Visit Summary.  MyChart is used to connect with patients for Virtual Visits (Telemedicine).  Patients are able to view lab/test results, encounter notes, upcoming appointments, etc.  Non-urgent messages can be sent to your provider as well.   To learn more about what you can do with MyChart, go to [email or web address].

## 2023-10-21 ENCOUNTER — Telehealth: Payer: Self-pay | Admitting: *Deleted

## 2023-10-21 NOTE — Telephone Encounter (Signed)
 Tosha from [facility] home care wanted to talk to Annabella or Marjorie they wanted to make sure about a order for wound care for her sacrum.  She just wants a callback on Monday so they can talk

## 2023-10-24 ENCOUNTER — Other Ambulatory Visit: Payer: Self-pay | Admitting: *Deleted

## 2023-10-24 DIAGNOSIS — C259 Malignant neoplasm of pancreas, unspecified: Secondary | ICD-10-CM

## 2023-10-25 ENCOUNTER — Inpatient Hospital Stay

## 2023-10-25 ENCOUNTER — Inpatient Hospital Stay: Admitting: Oncology

## 2023-10-25 ENCOUNTER — Encounter: Payer: Self-pay | Admitting: Oncology

## 2023-10-25 VITALS — BP 101/63 | HR 79

## 2023-10-25 VITALS — BP 110/58 | HR 100 | Temp 97.0°F | Resp 18 | Ht 64.0 in | Wt 162.0 lb

## 2023-10-25 DIAGNOSIS — Z5111 Encounter for antineoplastic chemotherapy: Secondary | ICD-10-CM | POA: Diagnosis not present

## 2023-10-25 DIAGNOSIS — C259 Malignant neoplasm of pancreas, unspecified: Secondary | ICD-10-CM

## 2023-10-25 DIAGNOSIS — D649 Anemia, unspecified: Secondary | ICD-10-CM | POA: Diagnosis not present

## 2023-10-25 LAB — CBC WITH DIFFERENTIAL (CANCER CENTER ONLY)
Abs Immature Granulocytes: 0.17 K/uL — ABNORMAL HIGH (ref 0.00–0.07)
Basophils Absolute: 0 K/uL (ref 0.0–0.1)
Basophils Relative: 0 %
Eosinophils Absolute: 0 K/uL (ref 0.0–0.5)
Eosinophils Relative: 0 %
HCT: 25.6 % — ABNORMAL LOW (ref 36.0–46.0)
Hemoglobin: 8.7 g/dL — ABNORMAL LOW (ref 12.0–15.0)
Immature Granulocytes: 1 %
Lymphocytes Relative: 9 %
Lymphs Abs: 1.1 K/uL (ref 0.7–4.0)
MCH: 30.4 pg (ref 26.0–34.0)
MCHC: 34 g/dL (ref 30.0–36.0)
MCV: 89.5 fL (ref 80.0–100.0)
Monocytes Absolute: 0.3 K/uL (ref 0.1–1.0)
Monocytes Relative: 3 %
Neutro Abs: 10.9 K/uL — ABNORMAL HIGH (ref 1.7–7.7)
Neutrophils Relative %: 87 %
Platelet Count: 190 K/uL (ref 150–400)
RBC: 2.86 MIL/uL — ABNORMAL LOW (ref 3.87–5.11)
RDW: 16.8 % — ABNORMAL HIGH (ref 11.5–15.5)
WBC Count: 12.6 K/uL — ABNORMAL HIGH (ref 4.0–10.5)
nRBC: 0 % (ref 0.0–0.2)

## 2023-10-25 LAB — CMP (CANCER CENTER ONLY)
ALT: 16 U/L (ref 0–44)
AST: 31 U/L (ref 15–41)
Albumin: 2.5 g/dL — ABNORMAL LOW (ref 3.5–5.0)
Alkaline Phosphatase: 105 U/L (ref 38–126)
Anion gap: 6 (ref 5–15)
BUN: 17 mg/dL (ref 8–23)
CO2: 19 mmol/L — ABNORMAL LOW (ref 22–32)
Calcium: 9.1 mg/dL (ref 8.9–10.3)
Chloride: 108 mmol/L (ref 98–111)
Creatinine: 0.85 mg/dL (ref 0.44–1.00)
GFR, Estimated: >60 mL/min (ref >60)
Glucose, Bld: 112 mg/dL — ABNORMAL HIGH (ref 70–99)
Potassium: 4.3 mmol/L (ref 3.5–5.1)
Sodium: 133 mmol/L — ABNORMAL LOW (ref 135–145)
Total Bilirubin: 1 mg/dL (ref 0.0–1.2)
Total Protein: 6.6 g/dL (ref 6.5–8.1)

## 2023-10-25 MED ORDER — SODIUM CHLORIDE 0.9 % IV SOLN
800.0000 mg/m2 | Freq: Once | INTRAVENOUS | Status: AC
  Administered 2023-10-25: 1520 mg via INTRAVENOUS
  Filled 2023-10-25: qty 39.98

## 2023-10-25 MED ORDER — PACLITAXEL PROTEIN-BOUND CHEMO INJECTION 100 MG
100.0000 mg/m2 | Freq: Once | INTRAVENOUS | Status: AC
  Administered 2023-10-25: 200 mg via INTRAVENOUS
  Filled 2023-10-25: qty 40

## 2023-10-25 MED ORDER — PROCHLORPERAZINE MALEATE 10 MG PO TABS
10.0000 mg | ORAL_TABLET | Freq: Once | ORAL | Status: AC
  Administered 2023-10-25: 10 mg via ORAL
  Filled 2023-10-25: qty 1

## 2023-10-25 MED ORDER — SODIUM CHLORIDE 0.9 % IV SOLN
INTRAVENOUS | Status: DC
  Filled 2023-10-25: qty 250

## 2023-10-25 NOTE — Patient Instructions (Signed)
 [facility] - A DEPT OF [facility]  Discharge Instructions: Thank you for choosing [facility] to provide your oncology and hematology care.  If you have a lab appointment with the Cancer Center, please go directly to the Cancer Center and check in at the registration area.  Wear comfortable clothing and clothing appropriate for easy access to any Portacath or PICC line.   We strive to give you quality time with your provider. You may need to reschedule your appointment if you arrive late (15 or more minutes).  Arriving late affects you and other patients whose appointments are after yours.  Also, if you miss three or more appointments without notifying the office, you may be dismissed from the clinic at the provider's discretion.      For prescription refill requests, have your pharmacy contact our office and allow 72 hours for refills to be completed.    Today you received the following chemotherapy and/or immunotherapy agents- abraxane, gemzar      To help prevent nausea and vomiting after your treatment, we encourage you to take your nausea medication as directed.  BELOW ARE SYMPTOMS THAT SHOULD BE REPORTED IMMEDIATELY: *FEVER GREATER THAN 100.4 F (38 C) OR HIGHER *CHILLS OR SWEATING *NAUSEA AND VOMITING THAT IS NOT CONTROLLED WITH YOUR NAUSEA MEDICATION *UNUSUAL SHORTNESS OF BREATH *UNUSUAL BRUISING OR BLEEDING *URINARY PROBLEMS (pain or burning when urinating, or frequent urination) *BOWEL PROBLEMS (unusual diarrhea, constipation, pain near the anus) TENDERNESS IN MOUTH AND THROAT WITH OR WITHOUT PRESENCE OF ULCERS (sore throat, sores in mouth, or a toothache) UNUSUAL RASH, SWELLING OR PAIN  UNUSUAL VAGINAL DISCHARGE OR ITCHING   Items with * indicate a potential emergency and should be followed up as soon as possible or go to the Emergency Department if any problems should occur.  Please show the CHEMOTHERAPY ALERT CARD or  IMMUNOTHERAPY ALERT CARD at check-in to the Emergency Department and triage nurse.  Should you have questions after your visit or need to cancel or reschedule your appointment, please contact [facility] - A DEPT OF [facility]  [phone] and follow the prompts.  Office hours are 8:00 a.m. to 4:30 p.m. Monday - Friday. Please note that voicemails left after 4:00 p.m. may not be returned until the following business day.  We are closed weekends and major holidays. You have access to a nurse at all times for urgent questions. Please call the main number to the clinic [phone] and follow the prompts.  For any non-urgent questions, you may also contact your provider using MyChart. We now offer e-Visits for anyone 38 and older to request care online for non-urgent symptoms. For details visit [email or web address].   Also download the MyChart app! Go to the app store, search "MyChart", open the app, select [place], and log in with your MyChart username and password.

## 2023-10-25 NOTE — Progress Notes (Signed)
 [facility]  Telephone:(336) [phone] Fax:(336) [phone]  ID: Colleen [name] OB: [birth year]  MR#: [id number]  CSN#:[id number]  Patient Care Team: Leavy Mole, PA-C as PCP - General (Family Medicine) Darliss Rogue, MD as PCP - Cardiology (Cardiology) Dave Agent, MD as Consulting Physician (Rheumatology) Ceola Tereasa PARAS, MD as Referring Physician (Oncology) Therisa Bi, MD as Consulting Physician (Gastroenterology) Maurie Rayfield BIRCH, RN as Oncology Nurse Navigator Jacobo, Evalene PARAS, MD as Consulting Physician (Oncology)  CHIEF COMPLAINT: Stage IV pancreatic adenocarcinoma.  INTERVAL HISTORY: Patient returns to clinic today for further evaluation and consideration of cycle 2, day 8 of dose reduced gemcitabine  and Abraxane .  She continues to have chronic weakness and fatigue, but otherwise feels well.  She has no neurologic complaints.  Her appetite has improved and she no longer has dysphagia.  She has no chest pain, shortness of breath, cough, or hemoptysis.  She has no abdominal pain.  She denies any nausea, vomiting, constipation, or diarrhea.  She has no urinary complaints.  Patient offers no further specific complaints today.  REVIEW OF SYSTEMS:   Review of Systems  Constitutional:  Positive for malaise/fatigue. Negative for fever and weight loss.  Respiratory: Negative.  Negative for cough, hemoptysis and shortness of breath.   Cardiovascular: Negative.  Negative for chest pain and leg swelling.  Gastrointestinal: Negative.  Negative for abdominal pain.  Genitourinary: Negative.  Negative for dysuria.  Musculoskeletal: Negative.  Negative for back pain.  Skin: Negative.  Negative for rash.  Neurological:  Positive for weakness. Negative for dizziness, focal weakness and headaches.  Psychiatric/Behavioral: Negative.  The patient is not nervous/anxious.     As per HPI. Otherwise, a complete review of systems is negative.  PAST MEDICAL HISTORY: Past  Medical History:  Diagnosis Date   Asthma    Cataract    COPD (chronic obstructive pulmonary disease) (HCC)    GERD (gastroesophageal reflux disease)    Gout    History of pulmonary embolus (PE)    Hyperlipidemia    Hypertension    Osteoarthrosis    Prediabetes     PAST SURGICAL HISTORY: Past Surgical History:  Procedure Laterality Date   BACK SURGERY     BREAST BIOPSY Left 04/08/2014   intraductal papilloma 5 mm   COLONOSCOPY WITH PROPOFOL  N/A 04/23/2020   Procedure: COLONOSCOPY WITH PROPOFOL ;  Surgeon: Janalyn Keene NOVAK, MD;  Location: [facility] ENDOSCOPY;  Service: Endoscopy;  Laterality: N/A;   ERCP N/A 08/05/2023   Procedure: ERCP, WITH INTERVENTION IF INDICATED;  Surgeon: Jinny Carmine, MD;  Location: [facility] ENDOSCOPY;  Service: Endoscopy;  Laterality: N/A;   ESOPHAGOGASTRODUODENOSCOPY (EGD) WITH PROPOFOL  N/A 04/23/2020   Procedure: ESOPHAGOGASTRODUODENOSCOPY (EGD) WITH PROPOFOL ;  Surgeon: Janalyn Keene NOVAK, MD;  Location: [facility] ENDOSCOPY;  Service: Endoscopy;  Laterality: N/A;   GIVENS CAPSULE STUDY N/A 01/28/2022   Procedure: GIVENS CAPSULE STUDY;  Surgeon: Therisa Bi, MD;  Location: [facility] ENDOSCOPY;  Service: Gastroenterology;  Laterality: N/A;   IR IMAGING GUIDED PORT INSERTION  09/12/2023   IR INT EXT BILIARY DRAIN WITH CHOLANGIOGRAM  08/08/2023   IR RADIOLOGIST EVAL & MGMT  09/22/2023   IR REMOVAL BILIARY DRAIN  09/22/2023   LUMBAR LAMINECTOMY     TONSILLECTOMY      FAMILY HISTORY: Family History  Problem Relation Age of Onset   Diabetes Mother    Lung cancer Father    Diabetes Sister    Congestive Heart Failure Sister    Glaucoma Sister    Breast cancer Maternal Aunt  ADVANCED DIRECTIVES (Y/N):  N  HEALTH MAINTENANCE: Social History   Tobacco Use   Smoking status: Former    Current packs/day: 0.00    Average packs/day: 1.5 packs/day for 20.0 years (30.0 ttl pk-yrs)    Types: Cigarettes    Start date: 11/13/1988    Quit date: 11/13/2008    Years since  quitting: 14.9   Smokeless tobacco: Never   Tobacco comments:    smoking cessation materials not required  Vaping Use   Vaping status: Never Used  Substance Use Topics   Alcohol use: Not Currently   Drug use: No     Colonoscopy:  PAP:  Bone density:  Lipid panel:  Allergies  Allergen Reactions   Fentanyl  Swelling    It messes with her heart.    Nsaids     Other Reaction(s): Not available   Oxycodone  Swelling    Current Outpatient Medications  Medication Sig Dispense Refill   acetaminophen  (TYLENOL ) 650 MG CR tablet Take 650 mg by mouth every 8 (eight) hours as needed for pain.     albuterol  (VENTOLIN  HFA) 108 (90 Base) MCG/ACT inhaler Inhale 2 puffs into the lungs every 4 (four) hours as needed for wheezing or shortness of breath. 8 g 2   allopurinol  (ZYLOPRIM ) 100 MG tablet Take 2 tablets (200 mg total) by mouth daily. 180 tablet 1   apixaban  (ELIQUIS ) 2.5 MG TABS tablet Take 2 tablets (5 mg total) by mouth 2 (two) times daily.     Blood Pressure Monitoring (ADULT BLOOD PRESSURE CUFF LG) KIT 1 each by Does not apply route daily. 1 kit 0   BREZTRI  AEROSPHERE 160-9-4.8 MCG/ACT AERO inhaler INHALE 2 PUFFS TWICE DAILY 33 g 0   calcium  carbonate (CALCIUM  600) 600 MG TABS tablet Take 2 tablets (1,200 mg total) by mouth daily with breakfast. 60 tablet 2   cholecalciferol (VITAMIN D3) 25 MCG (1000 UNIT) tablet Take 1,000 Units by mouth daily.     Famotidine  (PEPCID  PO) Take 1 tablet by mouth at bedtime.     lidocaine -prilocaine  (EMLA ) cream Apply to affected area once 30 g 3   magic mouthwash (lidocaine , diphenhydrAMINE, alum & mag hydroxide) suspension Swish and spit 5 mLs 4 (four) times daily as needed for mouth pain. 360 mL 0   megestrol  (MEGACE ) 20 MG tablet Take 1 tablet (20 mg total) by mouth 2 (two) times daily. 60 tablet 0   metoprolol  tartrate (LOPRESSOR ) 25 MG tablet Take 0.5 tablets (12.5 mg total) by mouth 2 (two) times daily.     midodrine  (PROAMATINE ) 5 MG tablet Take 1  tablet (5 mg total) by mouth 2 (two) times daily with a meal. Hold next dose if systolic BP is >100 or diastolic BP is >50     Multiple Vitamins-Minerals (MULTIVITAMIN WOMEN 50+ PO) Take 1 tablet by mouth daily.     ondansetron  (ZOFRAN ) 8 MG tablet Take 1 tablet (8 mg total) by mouth every 8 (eight) hours as needed for nausea or vomiting. 60 tablet 2   potassium chloride  SA (KLOR-CON  M) 20 MEQ tablet Take 1 tablet (20 mEq total) by mouth 2 (two) times daily. 60 tablet 1   simethicone  (GAS-X) 80 MG chewable tablet Chew 1 tablet (80 mg total) by mouth 2 (two) times a day. 60 tablet 2   sulfamethoxazole -trimethoprim  (BACTRIM  DS) 800-160 MG tablet Take 1 tablet by mouth 2 (two) times daily. 10 tablet 0   triamcinolone  cream (KENALOG ) 0.1 % APPLY TO AFFECTED AREA TWICE DAILY IF  NEEDED. NO MORE THAN 1 WEEK AT A TIME, THEN TAKE A BREAK FOR 2 WEEKS. 80 g 0   ondansetron  (ZOFRAN -ODT) 4 MG disintegrating tablet Take 1 tablet (4 mg total) by mouth every 8 (eight) hours as needed for nausea or vomiting. (Patient not taking: Reported on 10/25/2023) 20 tablet 0   pantoprazole  (PROTONIX ) 40 MG tablet Take 1 tablet (40 mg total) by mouth daily. (Patient not taking: Reported on 10/25/2023) 90 tablet 1   prochlorperazine  (COMPAZINE ) 10 MG tablet Take 1 tablet (10 mg total) by mouth every 6 (six) hours as needed for nausea or vomiting. (Patient not taking: Reported on 10/25/2023) 60 tablet 2   No current facility-administered medications for this visit.    OBJECTIVE: Vitals:   10/25/23 0940  BP: (!) 110/58  Pulse: 100  Resp: 18  Temp: (!) 97 F (36.1 C)     Body mass index is 27.81 kg/m.    ECOG FS:1 - Symptomatic but completely ambulatory  General: Well-developed, well-nourished, no acute distress.  Sitting in a wheelchair. Eyes: Pink conjunctiva, anicteric sclera. HEENT: Normocephalic, moist mucous membranes. Lungs: No audible wheezing or coughing. Heart: Regular rate and rhythm. Abdomen: Soft, nontender,  no obvious distention. Musculoskeletal: No edema, cyanosis, or clubbing. Neuro: Alert, answering all questions appropriately. Cranial nerves grossly intact. Skin: No rashes or petechiae noted. Psych: Normal affect.  LAB RESULTS:  Lab Results  Component Value Date   NA 133 (L) 10/25/2023   K 4.3 10/25/2023   CL 108 10/25/2023   CO2 19 (L) 10/25/2023   GLUCOSE 112 (H) 10/25/2023   BUN 17 10/25/2023   CREATININE 0.85 10/25/2023   CALCIUM  9.1 10/25/2023   PROT 6.6 10/25/2023   ALBUMIN 2.5 (L) 10/25/2023   AST 31 10/25/2023   ALT 16 10/25/2023   ALKPHOS 105 10/25/2023   BILITOT 1.0 10/25/2023   GFRNONAA >60 10/25/2023   GFRAA 58 (L) 02/28/2020    Lab Results  Component Value Date   WBC 12.6 (H) 10/25/2023   NEUTROABS 10.9 (H) 10/25/2023   HGB 8.7 (L) 10/25/2023   HCT 25.6 (L) 10/25/2023   MCV 89.5 10/25/2023   PLT 190 10/25/2023     STUDIES: DG Naso G Tube Plc W/Fl W/Rad Result Date: 10/05/2023 INDICATION: Moderate malnutrition with history of pancreatic cancer. History of gastroplasty and possible paraesophageal hernia. Recently with worsening dysphagia and mucositis. Request for NGT placement for additional nutrition. EXAM: NASO G TUBE PLACEMENT WITH FL AND WITH RAD FLUOROSCOPY TIME:  Radiation Exposure Index (as provided by the fluoroscopic device): 12.3 mGy Kerma COMPLICATIONS: None immediate PROCEDURE: The Dobhoff tube was lubricated and inserted into the right nostril. Under intermittent fluoroscopic guidance, the Dobhoff tube was advanced down to the lower esophageal sphincter. Several attempts were made to advance the catheter through the sphincter and into the stomach; however, each time was met with resistance causing the tubing to curl in the oropharynx. Dr. Marcelino was called into the room for additional assistance, but all attempts to advance the tube were met with resistance. Given that the catheter could not be advanced safely into the stomach, the decision was made  to remove the tube altogether vs leaving in place and risking retraction of the catheter or aspiration with feedings. IMPRESSION: Given history of gastroplasty and questionable paraesophageal hernia, complicated anatomy at the level of the LES and gastric cardia are difficult to navigate and the risk of advancing tube against high resistance was deemed greater than the benefit of placement. Unsuccessful fluoroscopic guided  placement of Dobhoff tube. No tube placed. Consider evaluating for other forms of nutrition supplementation at this time. This exam was performed by Sherrilee Bal, PA-C, and was supervised and interpreted by Dr. Harrietta Sherry. Electronically Signed   By: Harrietta Sherry M.D.   On: 10/05/2023 11:06   ECHOCARDIOGRAM COMPLETE Result Date: 10/03/2023    ECHOCARDIOGRAM REPORT   Patient Name:   Colleen [name] Date of Exam: 10/03/2023 Medical Rec #:  [id number]      Height:       64.0 in Accession #:    [id number]     Weight:       176.1 lb Date of Birth:  [birth year]      BSA:          1.854 m Patient Age:    78 years       BP:           106/66 mmHg Patient Gender: F              HR:           118 bpm. Exam Location:  [facility] Procedure: 2D Echo, Cardiac Doppler and Color Doppler (Both Spectral and Color            Flow Doppler were utilized during procedure). Indications:     Atrial Fibrillation I48.91  History:         Patient has prior history of Echocardiogram examinations, most                  recent 04/13/2019. Arrythmias:Atrial Fibrillation.  Sonographer:     Ashley McNeely-Sloane Referring Phys:  [id number] [facility] Diagnosing Phys: Deatrice Cage MD IMPRESSIONS  1. Left ventricular ejection fraction, by estimation, is 55 to 60%. The left ventricle has normal function. The left ventricle has no regional wall motion abnormalities. Left ventricular diastolic parameters are indeterminate.  2. Right ventricular systolic function is normal. The right ventricular size is normal. There is mildly  elevated pulmonary artery systolic pressure.  3. Left atrial size was mildly dilated.  4. Right atrial size was mildly dilated.  5. The mitral valve is normal in structure. Mild mitral valve regurgitation. No evidence of mitral stenosis.  6. Tricuspid valve regurgitation is moderate.  7. The aortic valve is normal in structure. Aortic valve regurgitation is not visualized. No aortic stenosis is present.  8. The inferior vena cava is dilated in size with >50% respiratory variability, suggesting right atrial pressure of 8 mmHg. FINDINGS  Left Ventricle: Left ventricular ejection fraction, by estimation, is 55 to 60%. The left ventricle has normal function. The left ventricle has no regional wall motion abnormalities. The left ventricular internal cavity size was normal in size. There is  no left ventricular hypertrophy. Left ventricular diastolic parameters are indeterminate. Right Ventricle: The right ventricular size is normal. No increase in right ventricular wall thickness. Right ventricular systolic function is normal. There is mildly elevated pulmonary artery systolic pressure. The tricuspid regurgitant velocity is 3.00  m/s, and with an assumed right atrial pressure of 8 mmHg, the estimated right ventricular systolic pressure is 44.0 mmHg. Left Atrium: Left atrial size was mildly dilated. Right Atrium: Right atrial size was mildly dilated. Pericardium: There is no evidence of pericardial effusion. Mitral Valve: The mitral valve is normal in structure. Mild mitral valve regurgitation. No evidence of mitral valve stenosis. MV peak gradient, 3.5 mmHg. The mean mitral valve gradient is 2.0 mmHg. Tricuspid Valve: The tricuspid valve is normal in structure.  Tricuspid valve regurgitation is moderate . No evidence of tricuspid stenosis. Aortic Valve: The aortic valve is normal in structure. Aortic valve regurgitation is not visualized. No aortic stenosis is present. Aortic valve mean gradient measures 3.0 mmHg. Aortic  valve peak gradient measures 4.9 mmHg. Aortic valve area, by VTI measures 1.59 cm. Pulmonic Valve: The pulmonic valve was normal in structure. Pulmonic valve regurgitation is mild. No evidence of pulmonic stenosis. Aorta: The aortic root is normal in size and structure. Venous: The inferior vena cava is dilated in size with greater than 50% respiratory variability, suggesting right atrial pressure of 8 mmHg. IAS/Shunts: No atrial level shunt detected by color flow Doppler.  LEFT VENTRICLE PLAX 2D LVIDd:         4.70 cm LVIDs:         3.20 cm LV PW:         1.30 cm LV IVS:        0.90 cm LVOT diam:     1.75 cm LV SV:         35 LV SV Index:   19 LVOT Area:     2.41 cm  LV Volumes (MOD) LV vol d, MOD A2C: 59.9 ml LV vol d, MOD A4C: 67.0 ml LV vol s, MOD A2C: 31.0 ml LV vol s, MOD A4C: 32.0 ml LV SV MOD A2C:     28.9 ml LV SV MOD A4C:     67.0 ml LV SV MOD BP:      35.3 ml RIGHT VENTRICLE RV S prime:     9.36 cm/s LEFT ATRIUM           Index        RIGHT ATRIUM           Index LA diam:      3.60 cm 1.94 cm/m   RA Area:     24.70 cm LA Vol (A4C): 84.8 ml 45.75 ml/m  RA Volume:   82.80 ml  44.67 ml/m  AORTIC VALVE                    PULMONIC VALVE AV Area (Vmax):    1.81 cm     PV Vmax:        1.17 m/s AV Area (Vmean):   1.70 cm     PV Vmean:       80.100 cm/s AV Area (VTI):     1.59 cm     PV VTI:         0.248 m AV Vmax:           111.00 cm/s  PV Peak grad:   5.5 mmHg AV Vmean:          76.800 cm/s  PV Mean grad:   3.0 mmHg AV VTI:            0.220 m      RVOT Peak grad: 2 mmHg AV Peak Grad:      4.9 mmHg AV Mean Grad:      3.0 mmHg LVOT Vmax:         83.60 cm/s LVOT Vmean:        54.400 cm/s LVOT VTI:          0.145 m LVOT/AV VTI ratio: 0.66  AORTA Ao Root diam: 2.90 cm Ao Asc diam:  3.00 cm MITRAL VALVE               TRICUSPID VALVE MV Area (PHT): 3.19 cm  TR Peak grad:   36.0 mmHg MV Area VTI:   1.75 cm    TR Mean grad:   27.0 mmHg MV Peak grad:  3.5 mmHg    TR Vmax:        300.00 cm/s MV Mean grad:   2.0 mmHg    TR Vmean:       255.0 cm/s MV Vmax:       0.93 m/s MV Vmean:      61.4 cm/s   SHUNTS MV Decel Time: 238 msec    Systemic VTI:  0.14 m MV E velocity: 72.10 cm/s  Systemic Diam: 1.75 cm MV A velocity: 47.70 cm/s  Pulmonic VTI:  0.151 m MV E/A ratio:  1.51 Deatrice Cage MD Electronically signed by Deatrice Cage MD Signature Date/Time: 10/03/2023/12:29:40 PM    Final     ASSESSMENT: Stage IV pancreatic adenocarcinoma.  PLAN:    Stage IV pancreatic adenocarcinoma: EUS at [facility] confirmed diagnosis.  PET scan results from August 22, 2023 reviewed independently with 3 hypermetabolic lesions in patient's liver consistent with metastatic disease.  Patient's CA 19-9 initially decreased from 6967 to 1666, but recently has trended up slightly to 1979.  Today's result is pending.  Both Abraxane  and gemcitabine  have been dose reduced.  Proceed with cycle 2, day 8 of treatment today.  Return to clinic in 1 week for further evaluation and consideration of cycle 2, day 15.  Patient may only be able to tolerate treatment on day 1 and 8.   Hyperbilirubinemia: Resolved.  External drain has been removed.   Hyponatremia: Sodium level is trended up slightly to 133.  Monitor. Renal insufficiency: Resolved. Leukocytosis: Total white blood cell continues to trend down.  Monitor. Anemia: Hemoglobin dropped to 8.7, monitor. Thrombocytosis: Solved. UTI: Resolved.   Poor appetite: Continue Megace  as prescribed.  Patient expressed understanding and was in agreement with this plan. She also understands that She can call clinic at any time with any questions, concerns, or complaints.    Cancer Staging  Primary pancreatic cancer with metastasis to other site [facility]) Staging form: Exocrine Pancreas, AJCC 8th Edition - Clinical stage from 09/07/2023: Stage IV (cT2, cN0, cM1) - Signed by Jacobo Evalene PARAS, MD on 09/07/2023 Stage prefix: Initial diagnosis Total positive nodes: 0   Evalene PARAS Jacobo, MD   10/25/2023 9:50  AM

## 2023-10-25 NOTE — Progress Notes (Signed)
 Nutrition Follow-up:  Patient with pancreatic adenocarcinoma.  Receiving abraxane  and gemcitabine .  Met with patient during infusion.  Reports that she is eating 2 meals a day.  Niece is cooking or bringing her food.  Yesterday ate ham, pinto beans and cornbread for lunch.  Dinner was cornbread, greens and chicken.  Breakfast this am was 2 eggs, bacon, cheese and water .  Says that ensure all flavors give her diarrhea.  Tried the juven but did not purchase any.  Says that her wound has healed.      Medications: MVI  Labs: reviewed  Anthropometrics:   Weight 162 lb today  171 lb on 9/17 177 lb on 8/26 182 lb on 7/14 205 lb on 3/17   NUTRITION DIAGNOSIS: Inadequate oral intake continues   INTERVENTION:  Continue MVI daily Reviewed foods rich in protein for wound healing Wrote down alterative shakes to try vs ensure (Fairlife, orgain, boost).   Continue appetite stimulant    MONITORING, EVALUATION, GOAL: weight trends, intake   NEXT VISIT: Tuesday, Oct 21 during infusion  Deziyah Arvin B. Dasie SOLON, CSO, LDN Registered Dietitian [phone]

## 2023-10-25 NOTE — Progress Notes (Signed)
 Patient has been noticing some spots on her hands. She is hurting on her right upper buttock/thigh area, which is where she fell on a couple of months ago.

## 2023-10-26 LAB — CANCER ANTIGEN 19-9: CA 19-9: 662 U/mL — ABNORMAL HIGH (ref 0–35)

## 2023-10-27 ENCOUNTER — Other Ambulatory Visit: Payer: Self-pay

## 2023-10-27 ENCOUNTER — Inpatient Hospital Stay

## 2023-10-27 ENCOUNTER — Emergency Department

## 2023-10-27 ENCOUNTER — Inpatient Hospital Stay
Admission: EM | Admit: 2023-10-27 | Discharge: 2023-11-04 | DRG: 871 | Disposition: A | Discharge status: Home Health | Attending: Obstetrics and Gynecology | Admitting: Obstetrics and Gynecology

## 2023-10-27 DIAGNOSIS — I959 Hypotension, unspecified: Secondary | ICD-10-CM | POA: Diagnosis present

## 2023-10-27 DIAGNOSIS — J9601 Acute respiratory failure with hypoxia: Secondary | ICD-10-CM | POA: Diagnosis not present

## 2023-10-27 DIAGNOSIS — E785 Hyperlipidemia, unspecified: Secondary | ICD-10-CM | POA: Diagnosis present

## 2023-10-27 DIAGNOSIS — I471 Supraventricular tachycardia, unspecified: Secondary | ICD-10-CM | POA: Diagnosis present

## 2023-10-27 DIAGNOSIS — R262 Difficulty in walking, not elsewhere classified: Secondary | ICD-10-CM | POA: Diagnosis present

## 2023-10-27 DIAGNOSIS — I10 Essential (primary) hypertension: Secondary | ICD-10-CM | POA: Diagnosis present

## 2023-10-27 DIAGNOSIS — Z885 Allergy status to narcotic agent status: Secondary | ICD-10-CM

## 2023-10-27 DIAGNOSIS — J40 Bronchitis, not specified as acute or chronic: Secondary | ICD-10-CM | POA: Diagnosis not present

## 2023-10-27 DIAGNOSIS — I4891 Unspecified atrial fibrillation: Principal | ICD-10-CM | POA: Diagnosis present

## 2023-10-27 DIAGNOSIS — R579 Shock, unspecified: Secondary | ICD-10-CM | POA: Diagnosis not present

## 2023-10-27 DIAGNOSIS — C259 Malignant neoplasm of pancreas, unspecified: Secondary | ICD-10-CM | POA: Diagnosis present

## 2023-10-27 DIAGNOSIS — R64 Cachexia: Secondary | ICD-10-CM | POA: Diagnosis present

## 2023-10-27 DIAGNOSIS — R000 Tachycardia, unspecified: Secondary | ICD-10-CM | POA: Diagnosis not present

## 2023-10-27 DIAGNOSIS — D630 Anemia in neoplastic disease: Secondary | ICD-10-CM | POA: Diagnosis present

## 2023-10-27 DIAGNOSIS — R4182 Altered mental status, unspecified: Secondary | ICD-10-CM | POA: Diagnosis not present

## 2023-10-27 DIAGNOSIS — S79911A Unspecified injury of right hip, initial encounter: Secondary | ICD-10-CM | POA: Diagnosis not present

## 2023-10-27 DIAGNOSIS — Z7960 Long term (current) use of unspecified immunomodulators and immunosuppressants: Secondary | ICD-10-CM

## 2023-10-27 DIAGNOSIS — Z86711 Personal history of pulmonary embolism: Secondary | ICD-10-CM | POA: Diagnosis not present

## 2023-10-27 DIAGNOSIS — E872 Acidosis, unspecified: Secondary | ICD-10-CM | POA: Diagnosis not present

## 2023-10-27 DIAGNOSIS — Z886 Allergy status to analgesic agent status: Secondary | ICD-10-CM

## 2023-10-27 DIAGNOSIS — R531 Weakness: Secondary | ICD-10-CM | POA: Diagnosis not present

## 2023-10-27 DIAGNOSIS — Z801 Family history of malignant neoplasm of trachea, bronchus and lung: Secondary | ICD-10-CM

## 2023-10-27 DIAGNOSIS — Z5982 Transportation insecurity: Secondary | ICD-10-CM

## 2023-10-27 DIAGNOSIS — R0602 Shortness of breath: Secondary | ICD-10-CM | POA: Diagnosis not present

## 2023-10-27 DIAGNOSIS — Z803 Family history of malignant neoplasm of breast: Secondary | ICD-10-CM

## 2023-10-27 DIAGNOSIS — Z83511 Family history of glaucoma: Secondary | ICD-10-CM

## 2023-10-27 DIAGNOSIS — I4719 Other supraventricular tachycardia: Secondary | ICD-10-CM | POA: Diagnosis not present

## 2023-10-27 DIAGNOSIS — D638 Anemia in other chronic diseases classified elsewhere: Secondary | ICD-10-CM | POA: Diagnosis present

## 2023-10-27 DIAGNOSIS — J90 Pleural effusion, not elsewhere classified: Secondary | ICD-10-CM | POA: Diagnosis present

## 2023-10-27 DIAGNOSIS — I4892 Unspecified atrial flutter: Secondary | ICD-10-CM | POA: Diagnosis present

## 2023-10-27 DIAGNOSIS — Z79899 Other long term (current) drug therapy: Secondary | ICD-10-CM

## 2023-10-27 DIAGNOSIS — J4489 Other specified chronic obstructive pulmonary disease: Secondary | ICD-10-CM | POA: Diagnosis present

## 2023-10-27 DIAGNOSIS — E876 Hypokalemia: Secondary | ICD-10-CM | POA: Diagnosis not present

## 2023-10-27 DIAGNOSIS — Z8249 Family history of ischemic heart disease and other diseases of the circulatory system: Secondary | ICD-10-CM | POA: Diagnosis not present

## 2023-10-27 DIAGNOSIS — Z833 Family history of diabetes mellitus: Secondary | ICD-10-CM | POA: Diagnosis not present

## 2023-10-27 DIAGNOSIS — Z043 Encounter for examination and observation following other accident: Secondary | ICD-10-CM | POA: Diagnosis not present

## 2023-10-27 DIAGNOSIS — A419 Sepsis, unspecified organism: Principal | ICD-10-CM | POA: Diagnosis present

## 2023-10-27 DIAGNOSIS — Z87891 Personal history of nicotine dependence: Secondary | ICD-10-CM

## 2023-10-27 DIAGNOSIS — E875 Hyperkalemia: Secondary | ICD-10-CM | POA: Diagnosis present

## 2023-10-27 DIAGNOSIS — M1611 Unilateral primary osteoarthritis, right hip: Secondary | ICD-10-CM | POA: Diagnosis present

## 2023-10-27 DIAGNOSIS — M25551 Pain in right hip: Secondary | ICD-10-CM | POA: Diagnosis present

## 2023-10-27 DIAGNOSIS — I480 Paroxysmal atrial fibrillation: Secondary | ICD-10-CM | POA: Diagnosis present

## 2023-10-27 DIAGNOSIS — D62 Acute posthemorrhagic anemia: Secondary | ICD-10-CM | POA: Diagnosis present

## 2023-10-27 DIAGNOSIS — I498 Other specified cardiac arrhythmias: Secondary | ICD-10-CM | POA: Diagnosis not present

## 2023-10-27 DIAGNOSIS — Z604 Social exclusion and rejection: Secondary | ICD-10-CM | POA: Diagnosis present

## 2023-10-27 DIAGNOSIS — R54 Age-related physical debility: Secondary | ICD-10-CM | POA: Diagnosis present

## 2023-10-27 DIAGNOSIS — D649 Anemia, unspecified: Secondary | ICD-10-CM | POA: Diagnosis not present

## 2023-10-27 DIAGNOSIS — Z7901 Long term (current) use of anticoagulants: Secondary | ICD-10-CM | POA: Diagnosis not present

## 2023-10-27 LAB — COMPREHENSIVE METABOLIC PANEL WITH GFR
ALT: 15 U/L (ref 0–44)
AST: 48 U/L — ABNORMAL HIGH (ref 15–41)
Albumin: 2.2 g/dL — ABNORMAL LOW (ref 3.5–5.0)
Alkaline Phosphatase: 106 U/L (ref 38–126)
Anion gap: 10 (ref 5–15)
BUN: 19 mg/dL (ref 8–23)
CO2: 18 mmol/L — ABNORMAL LOW (ref 22–32)
Calcium: 8.5 mg/dL — ABNORMAL LOW (ref 8.9–10.3)
Chloride: 106 mmol/L (ref 98–111)
Creatinine, Ser: 0.78 mg/dL (ref 0.44–1.00)
GFR, Estimated: >60 mL/min (ref >60)
Glucose, Bld: 131 mg/dL — ABNORMAL HIGH (ref 70–99)
Potassium: 5.2 mmol/L — ABNORMAL HIGH (ref 3.5–5.1)
Sodium: 134 mmol/L — ABNORMAL LOW (ref 135–145)
Total Bilirubin: 1.8 mg/dL — ABNORMAL HIGH (ref 0.0–1.2)
Total Protein: 6.6 g/dL (ref 6.5–8.1)

## 2023-10-27 LAB — RESPIRATORY PANEL BY PCR

## 2023-10-27 LAB — TROPONIN I (HIGH SENSITIVITY)
Troponin I (High Sensitivity): 50 ng/L — ABNORMAL HIGH (ref <18)
Troponin I (High Sensitivity): 44 ng/L — ABNORMAL HIGH (ref <18)

## 2023-10-27 LAB — CBC
HCT: 25 % — ABNORMAL LOW (ref 36.0–46.0)
Hemoglobin: 8.3 g/dL — ABNORMAL LOW (ref 12.0–15.0)
MCH: 30.9 pg (ref 26.0–34.0)
MCHC: 33.2 g/dL (ref 30.0–36.0)
MCV: 92.9 fL (ref 80.0–100.0)
Platelets: 181 K/uL (ref 150–400)
RBC: 2.69 MIL/uL — ABNORMAL LOW (ref 3.87–5.11)
RDW: 17.1 % — ABNORMAL HIGH (ref 11.5–15.5)
WBC: 22 K/uL — ABNORMAL HIGH (ref 4.0–10.5)
nRBC: 0.1 % (ref 0.0–0.2)

## 2023-10-27 LAB — EXPECTORATED SPUTUM ASSESSMENT W GRAM STAIN, RFLX TO RESP C

## 2023-10-27 LAB — LACTIC ACID, PLASMA
Lactic Acid, Venous: 2.6 mmol/L (ref 0.5–1.9)
Lactic Acid, Venous: 3.4 mmol/L (ref 0.5–1.9)

## 2023-10-27 LAB — PROCALCITONIN: Procalcitonin: 5.81 ng/mL

## 2023-10-27 MED ORDER — APIXABAN 5 MG PO TABS
5.0000 mg | ORAL_TABLET | Freq: Two times a day (BID) | ORAL | Status: DC
  Administered 2023-10-27: 5 mg via ORAL
  Filled 2023-10-27: qty 1

## 2023-10-27 MED ORDER — HYDROMORPHONE HCL 1 MG/ML IJ SOLN: 0.5000 mg | INTRAMUSCULAR | Status: DC | PRN

## 2023-10-27 MED ORDER — LIDOCAINE VISCOUS HCL 2 % MT SOLN: 5.0000 mL | Freq: Four times a day (QID) | OROMUCOSAL | Status: DC | PRN

## 2023-10-27 MED ORDER — DILTIAZEM HCL-DEXTROSE 125-5 MG/125ML-% IV SOLN (PREMIX)
5.0000 mg/h | INTRAVENOUS | Status: DC
  Administered 2023-10-27: 5 mg/h via INTRAVENOUS
  Filled 2023-10-27: qty 125

## 2023-10-27 MED ORDER — MIDODRINE HCL 5 MG PO TABS
5.0000 mg | ORAL_TABLET | Freq: Three times a day (TID) | ORAL | Status: DC
  Administered 2023-10-27 – 2023-11-02 (×17): 5 mg via ORAL
  Filled 2023-10-27 (×17): qty 1

## 2023-10-27 MED ORDER — ONDANSETRON HCL 4 MG PO TABS: 4.0000 mg | ORAL_TABLET | Freq: Four times a day (QID) | ORAL | Status: DC | PRN

## 2023-10-27 MED ORDER — SODIUM CHLORIDE 0.9 % IV BOLUS
500.0000 mL | Freq: Once | INTRAVENOUS | Status: AC
  Administered 2023-10-27: 500 mL via INTRAVENOUS

## 2023-10-27 MED ORDER — BUDESON-GLYCOPYRROL-FORMOTEROL 160-9-4.8 MCG/ACT IN AERO
2.0000 | INHALATION_SPRAY | Freq: Two times a day (BID) | RESPIRATORY_TRACT | Status: DC
  Administered 2023-10-27 – 2023-11-04 (×12): 2 via RESPIRATORY_TRACT
  Filled 2023-10-27: qty 5.9

## 2023-10-27 MED ORDER — ACETAMINOPHEN 325 MG PO TABS
650.0000 mg | ORAL_TABLET | Freq: Four times a day (QID) | ORAL | Status: DC | PRN
  Filled 2023-10-27: qty 2

## 2023-10-27 MED ORDER — AMIODARONE LOAD VIA INFUSION
150.0000 mg | Freq: Once | INTRAVENOUS | Status: AC
  Administered 2023-10-27: 150 mg via INTRAVENOUS
  Filled 2023-10-27: qty 83.34

## 2023-10-27 MED ORDER — GUAIFENESIN ER 600 MG PO TB12
600.0000 mg | ORAL_TABLET | Freq: Two times a day (BID) | ORAL | Status: DC
Start: 2023-10-27 — End: 2023-11-04
  Administered 2023-10-27 – 2023-11-04 (×16): 600 mg via ORAL
  Filled 2023-10-27 (×16): qty 1

## 2023-10-27 MED ORDER — SODIUM CHLORIDE 0.9 % IV SOLN
100.0000 mg | Freq: Two times a day (BID) | INTRAVENOUS | Status: DC
  Administered 2023-10-27 – 2023-10-28 (×2): 100 mg via INTRAVENOUS
  Filled 2023-10-27 (×4): qty 100

## 2023-10-27 MED ORDER — ALBUMIN HUMAN 25 % IV SOLN
25.0000 g | Freq: Once | INTRAVENOUS | Status: AC
  Administered 2023-10-27: 25 g via INTRAVENOUS
  Filled 2023-10-27 (×2): qty 100

## 2023-10-27 MED ORDER — MEGESTROL ACETATE 20 MG PO TABS
20.0000 mg | ORAL_TABLET | Freq: Two times a day (BID) | ORAL | Status: DC
  Administered 2023-10-27 – 2023-11-04 (×16): 20 mg via ORAL
  Filled 2023-10-27 (×16): qty 1

## 2023-10-27 MED ORDER — SIMETHICONE 80 MG PO CHEW
80.0000 mg | CHEWABLE_TABLET | Freq: Two times a day (BID) | ORAL | Status: DC
  Administered 2023-10-28 – 2023-11-04 (×15): 80 mg via ORAL
  Filled 2023-10-27 (×15): qty 1

## 2023-10-27 MED ORDER — ALLOPURINOL 100 MG PO TABS
200.0000 mg | ORAL_TABLET | Freq: Every day | ORAL | Status: DC
  Administered 2023-10-28 – 2023-11-04 (×8): 200 mg via ORAL
  Filled 2023-10-27 (×8): qty 2

## 2023-10-27 MED ORDER — MAGIC MOUTHWASH: 5.0000 mL | Freq: Four times a day (QID) | ORAL | Status: DC | PRN

## 2023-10-27 MED ORDER — SODIUM CHLORIDE 0.9 % IV BOLUS
1000.0000 mL | Freq: Once | INTRAVENOUS | Status: AC
  Administered 2023-10-27: 1000 mL via INTRAVENOUS

## 2023-10-27 MED ORDER — ALBUTEROL SULFATE HFA 108 (90 BASE) MCG/ACT IN AERS: 2.0000 | INHALATION_SPRAY | RESPIRATORY_TRACT | Status: DC | PRN

## 2023-10-27 MED ORDER — AMIODARONE HCL IN DEXTROSE 360-4.14 MG/200ML-% IV SOLN
60.0000 mg/h | INTRAVENOUS | Status: DC
  Administered 2023-10-27 (×2): 60 mg/h via INTRAVENOUS
  Filled 2023-10-27 (×2): qty 200

## 2023-10-27 MED ORDER — AMIODARONE HCL IN DEXTROSE 360-4.14 MG/200ML-% IV SOLN
30.0000 mg/h | INTRAVENOUS | Status: DC
  Administered 2023-10-27 – 2023-10-29 (×4): 30 mg/h via INTRAVENOUS
  Filled 2023-10-27 (×3): qty 200

## 2023-10-27 MED ORDER — ONDANSETRON HCL 4 MG/2ML IJ SOLN: 4.0000 mg | Freq: Four times a day (QID) | INTRAMUSCULAR | Status: DC | PRN

## 2023-10-27 MED ORDER — FAMOTIDINE 20 MG PO TABS
10.0000 mg | ORAL_TABLET | Freq: Every day | ORAL | Status: DC
  Administered 2023-10-27 – 2023-11-03 (×8): 10 mg via ORAL
  Filled 2023-10-27 (×8): qty 1

## 2023-10-27 MED ORDER — METOPROLOL TARTRATE 25 MG PO TABS
12.5000 mg | ORAL_TABLET | Freq: Two times a day (BID) | ORAL | Status: DC
  Administered 2023-10-27 – 2023-10-29 (×3): 12.5 mg via ORAL
  Filled 2023-10-27 (×4): qty 1

## 2023-10-27 MED ORDER — ACETAMINOPHEN 650 MG RE SUPP: 650.0000 mg | Freq: Four times a day (QID) | RECTAL | Status: DC | PRN

## 2023-10-27 MED ORDER — IPRATROPIUM BROMIDE 0.02 % IN SOLN: 0.5000 mg | RESPIRATORY_TRACT | Status: DC | PRN

## 2023-10-27 NOTE — ED Notes (Signed)
 Diltiazem restarted at this time. BP 119/57 MAP 72.

## 2023-10-27 NOTE — H&P (Signed)
 History and Physical    Colleen [name] FMW:[id number] DOB: 1945-12-20 DOA: 10/27/2023  PCP: Leavy Mole, PA-C (Confirm with patient/family/NH records and if not entered, this has to be entered at [facility] point of entry) Patient coming from: Home  I have personally briefly reviewed patient's old medical records in [facility]  Chief Complaint: Feeling better  HPI: Colleen [name] is a 78 y.o. female with medical history significant of recently diagnosed PAF, stage IV metastatic pancreatic cancer on chemotherapy, gout, orthostatic hypotension, GERD, sent by home care nurse for evaluation of hypotension and rapid A-fib.  Patient was found to have hypotension this morning by home care nurse, when her blood pressure reading was 70/40 and patient did complain about feeling of lightheadedness.  Heart rate was found to be in the 120s.  Meantime, patient also complaining about having had productive cough with whitish phlegm for last 2 days, more cough at night but denies any chest pain no fever or chills.  Patient denied any dysuria or urinary frequency, denied any diarrhea.  Furthermore, patient also complaining of worsening of right hip pain, she sustained a fall and hit her right hip recently and since then she has been getting worsening of hip pain and her ambulation has been reduced to roller walker, even so she still experiences severe right hip pain.  ED Course: Patient was found to be in rapid A-fib, Cardizem drip was started in the ED however blood pressure further dropped to 69/54, Cardizem drip stopped and patient was started on amiodarone  drip and blood pressure improved to 114/61.  Chest x-ray negative for acute findings, blood work showed K5.2 BUN 19 creatinine 0.7 glucose 131 lactic acid 2.6 troponin 50 hemoglobin 8.3 WBC 22.  Review of Systems: As per HPI otherwise 14 point review of systems negative.    Past Medical History:  Diagnosis Date   Asthma    Cataract    COPD (chronic  obstructive pulmonary disease) (HCC)    GERD (gastroesophageal reflux disease)    Gout    History of pulmonary embolus (PE)    Hyperlipidemia    Hypertension    Osteoarthrosis    Prediabetes     Past Surgical History:  Procedure Laterality Date   BACK SURGERY     BREAST BIOPSY Left 04/08/2014   intraductal papilloma 5 mm   COLONOSCOPY WITH PROPOFOL  N/A 04/23/2020   Procedure: COLONOSCOPY WITH PROPOFOL ;  Surgeon: Janalyn Keene NOVAK, MD;  Location: [facility] ENDOSCOPY;  Service: Endoscopy;  Laterality: N/A;   ERCP N/A 08/05/2023   Procedure: ERCP, WITH INTERVENTION IF INDICATED;  Surgeon: Jinny Carmine, MD;  Location: [facility] ENDOSCOPY;  Service: Endoscopy;  Laterality: N/A;   ESOPHAGOGASTRODUODENOSCOPY (EGD) WITH PROPOFOL  N/A 04/23/2020   Procedure: ESOPHAGOGASTRODUODENOSCOPY (EGD) WITH PROPOFOL ;  Surgeon: Janalyn Keene NOVAK, MD;  Location: [facility] ENDOSCOPY;  Service: Endoscopy;  Laterality: N/A;   GIVENS CAPSULE STUDY N/A 01/28/2022   Procedure: GIVENS CAPSULE STUDY;  Surgeon: Therisa Bi, MD;  Location: [facility] ENDOSCOPY;  Service: Gastroenterology;  Laterality: N/A;   IR IMAGING GUIDED PORT INSERTION  09/12/2023   IR INT EXT BILIARY DRAIN WITH CHOLANGIOGRAM  08/08/2023   IR RADIOLOGIST EVAL & MGMT  09/22/2023   IR REMOVAL BILIARY DRAIN  09/22/2023   LUMBAR LAMINECTOMY     TONSILLECTOMY       reports that she quit smoking about 14 years ago. Her smoking use included cigarettes. She started smoking about 34 years ago. She has a 30 pack-year smoking history. She has never  used smokeless tobacco. She reports that she does not currently use alcohol. She reports that she does not use drugs.  Allergies  Allergen Reactions   Fentanyl  Swelling    It messes with her heart.    Nsaids     Other Reaction(s): Not available   Oxycodone  Swelling    Family History  Problem Relation Age of Onset   Diabetes Mother    Lung cancer Father    Diabetes Sister    Congestive Heart Failure Sister    Glaucoma  Sister    Breast cancer Maternal Aunt      Prior to Admission medications   Medication Sig Start Date End Date Taking? Authorizing Provider  acetaminophen  (TYLENOL ) 650 MG CR tablet Take 650 mg by mouth every 8 (eight) hours as needed for pain.    [provider]  albuterol  (VENTOLIN  HFA) 108 (90 Base) MCG/ACT inhaler Inhale 2 puffs into the lungs every 4 (four) hours as needed for wheezing or shortness of breath. 03/31/23   Tapia, Leisa, PA-C  allopurinol  (ZYLOPRIM ) 100 MG tablet Take 2 tablets (200 mg total) by mouth daily. 08/01/23   Tapia, Leisa, PA-C  apixaban  (ELIQUIS ) 2.5 MG TABS tablet Take 2 tablets (5 mg total) by mouth 2 (two) times daily. 09/12/23   Darron Deatrice LABOR, MD  Blood Pressure Monitoring (ADULT BLOOD PRESSURE CUFF LG) KIT 1 each by Does not apply route daily. 09/01/21   Bernardo Fend, DO  BREZTRI  AEROSPHERE 160-9-4.8 MCG/ACT AERO inhaler INHALE 2 PUFFS TWICE DAILY 08/19/23   Tapia, Leisa, PA-C  calcium  carbonate (CALCIUM  600) 600 MG TABS tablet Take 2 tablets (1,200 mg total) by mouth daily with breakfast. 10/10/23   Dasie Tinnie MATSU, NP  cholecalciferol (VITAMIN D3) 25 MCG (1000 UNIT) tablet Take 1,000 Units by mouth daily.    [provider]  Famotidine  (PEPCID  PO) Take 1 tablet by mouth at bedtime.    [provider]  lidocaine -prilocaine  (EMLA ) cream Apply to affected area once 09/07/23   Jacobo Evalene PARAS, MD  magic mouthwash (lidocaine , diphenhydrAMINE, alum & mag hydroxide) suspension Swish and spit 5 mLs 4 (four) times daily as needed for mouth pain. 10/01/23   Babara Call, MD  megestrol  (MEGACE ) 20 MG tablet Take 1 tablet (20 mg total) by mouth 2 (two) times daily. 10/18/23   Jacobo Evalene PARAS, MD  metoprolol  tartrate (LOPRESSOR ) 25 MG tablet Take 0.5 tablets (12.5 mg total) by mouth 2 (two) times daily. 10/07/23 10/06/24  Wouk, Devaughn Sayres, MD  midodrine  (PROAMATINE ) 5 MG tablet Take 1 tablet (5 mg total) by mouth 2 (two) times daily with a meal.  Hold next dose if systolic BP is >100 or diastolic BP is >50 10/19/23   Loistine Sober, NP  Multiple Vitamins-Minerals (MULTIVITAMIN WOMEN 50+ PO) Take 1 tablet by mouth daily.    [provider]  ondansetron  (ZOFRAN ) 8 MG tablet Take 1 tablet (8 mg total) by mouth every 8 (eight) hours as needed for nausea or vomiting. 09/07/23   Jacobo Evalene PARAS, MD  ondansetron  (ZOFRAN -ODT) 4 MG disintegrating tablet Take 1 tablet (4 mg total) by mouth every 8 (eight) hours as needed for nausea or vomiting. Patient not taking: Reported on 10/25/2023 08/09/23   Dorinda Drue DASEN, MD  pantoprazole  (PROTONIX ) 40 MG tablet Take 1 tablet (40 mg total) by mouth daily. Patient not taking: Reported on 10/25/2023 09/09/23   Tapia, Leisa, PA-C  potassium chloride  SA (KLOR-CON  M) 20 MEQ tablet Take 1 tablet (20 mEq total)  by mouth 2 (two) times daily. 10/10/23   Dasie Tinnie MATSU, NP  prochlorperazine  (COMPAZINE ) 10 MG tablet Take 1 tablet (10 mg total) by mouth every 6 (six) hours as needed for nausea or vomiting. Patient not taking: Reported on 10/25/2023 09/07/23   Jacobo Evalene PARAS, MD  simethicone  (GAS-X) 80 MG chewable tablet Chew 1 tablet (80 mg total) by mouth 2 (two) times a day. 08/02/18 06/17/28  Dorothyann Drivers, MD  sulfamethoxazole -trimethoprim  (BACTRIM  DS) 800-160 MG tablet Take 1 tablet by mouth 2 (two) times daily. 10/12/23   Jacobo Evalene PARAS, MD  triamcinolone  cream (KENALOG ) 0.1 % APPLY TO AFFECTED AREA TWICE DAILY IF NEEDED. NO MORE THAN 1 WEEK AT A TIME, THEN TAKE A BREAK FOR 2 WEEKS. 02/10/22   Bernardo Fend, DO    Physical Exam: Vitals:   10/27/23 1600 10/27/23 1615 10/27/23 1630 10/27/23 1645  BP: (!) 100/54 (!) 103/53 (!) 110/59 (!) 109/58  Pulse: (!) 107 (!) 105 (!) 106 98  Resp: (!) 21 (!) 24 20 (!) 22  Temp:      SpO2: 96% 97% 98% 100%  Weight:      Height:        Constitutional: NAD, calm, comfortable Vitals:   10/27/23 1600 10/27/23 1615 10/27/23 1630 10/27/23 1645  BP:  (!) 100/54 (!) 103/53 (!) 110/59 (!) 109/58  Pulse: (!) 107 (!) 105 (!) 106 98  Resp: (!) 21 (!) 24 20 (!) 22  Temp:      SpO2: 96% 97% 98% 100%  Weight:      Height:       Eyes: PERRL, lids and conjunctivae normal ENMT: Mucous membranes are moist. Posterior pharynx clear of any exudate or lesions.Normal dentition.  Neck: normal, supple, no masses, no thyromegaly Respiratory: clear to auscultation bilaterally, no wheezing, no crackles. Normal respiratory effort. No accessory muscle use.  Cardiovascular: Irregular heart rate, no murmurs / rubs / gallops. No extremity edema. 2+ pedal pulses. No carotid bruits.  Abdomen: no tenderness, no masses palpated. No hepatosplenomegaly. Bowel sounds positive.  Musculoskeletal: no clubbing / cyanosis. No joint deformity upper and lower extremities. Good ROM, no contractures. Normal muscle tone.  Right hip area tenderness Skin: no rashes, lesions, ulcers. No induration Neurologic: CN 2-12 grossly intact. Sensation intact, DTR normal. Strength 5/5 in all 4.  Psychiatric: Normal judgment and insight. Alert and oriented x 3. Normal mood.     Labs on Admission: I have personally reviewed following labs and imaging studies  CBC: Recent Labs  Lab 10/25/23 0918 10/27/23 1417  WBC 12.6* 22.0*  NEUTROABS 10.9*  --   HGB 8.7* 8.3*  HCT 25.6* 25.0*  MCV 89.5 92.9  PLT 190 181   Basic Metabolic Panel: Recent Labs  Lab 10/25/23 0918 10/27/23 1417  NA 133* 134*  K 4.3 5.2*  CL 108 106  CO2 19* 18*  GLUCOSE 112* 131*  BUN 17 19  CREATININE 0.85 0.78  CALCIUM  9.1 8.5*   GFR: Estimated Creatinine Clearance: 58 mL/min (by C-G formula based on SCr of 0.78 mg/dL). Liver Function Tests: Recent Labs  Lab 10/25/23 0918 10/27/23 1417  AST 31 48*  ALT 16 15  ALKPHOS 105 106  BILITOT 1.0 1.8*  PROT 6.6 6.6  ALBUMIN 2.5* 2.2*   No results for input(s): LIPASE, AMYLASE in the last 168 hours. No results for input(s): AMMONIA in the last  168 hours. Coagulation Profile: No results for input(s): INR, PROTIME in the last 168 hours. Cardiac Enzymes: No results  for input(s): CKTOTAL, CKMB, CKMBINDEX, TROPONINI in the last 168 hours. BNP (last 3 results) No results for input(s): PROBNP in the last 8760 hours. HbA1C: No results for input(s): HGBA1C in the last 72 hours. CBG: No results for input(s): GLUCAP in the last 168 hours. Lipid Profile: No results for input(s): CHOL, HDL, LDLCALC, TRIG, CHOLHDL, LDLDIRECT in the last 72 hours. Thyroid  Function Tests: No results for input(s): TSH, T4TOTAL, FREET4, T3FREE, THYROIDAB in the last 72 hours. Anemia Panel: No results for input(s): VITAMINB12, FOLATE, FERRITIN, TIBC, IRON, RETICCTPCT in the last 72 hours. Urine analysis:    Component Value Date/Time   COLORURINE YELLOW (A) 10/18/2023 1208   APPEARANCEUR CLEAR (A) 10/18/2023 1208   LABSPEC 1.017 10/18/2023 1208   PHURINE 6.0 10/18/2023 1208   GLUCOSEU NEGATIVE 10/18/2023 1208   HGBUR NEGATIVE 10/18/2023 1208   BILIRUBINUR NEGATIVE 10/18/2023 1208   BILIRUBINUR negative 07/26/2022 1046   KETONESUR NEGATIVE 10/18/2023 1208   PROTEINUR NEGATIVE 10/18/2023 1208   UROBILINOGEN 0.2 07/26/2022 1046   NITRITE NEGATIVE 10/18/2023 1208   LEUKOCYTESUR NEGATIVE 10/18/2023 1208    Radiological Exams on Admission: DG Chest Portable 1 View Result Date: 10/27/2023 CLINICAL DATA:  Shortness of breath. EXAM: PORTABLE CHEST 1 VIEW COMPARISON:  August 23, 2021. FINDINGS: Stable cardiomediastinal silhouette. Right internal jugular Port-A-Cath is noted. Lungs are clear. Degenerative changes are seen involving both shoulder joints. IMPRESSION: No active disease. Electronically Signed   By: Lynwood Landy Raddle M.D.   On: 10/27/2023 15:09   DG Pelvis Portable Result Date: 10/27/2023 CLINICAL DATA:  Fall. EXAM: PORTABLE PELVIS 1-2 VIEWS COMPARISON:  None Available. FINDINGS: There is no evidence of  pelvic fracture or diastasis. No pelvic bone lesions are seen. IMPRESSION: Negative. Electronically Signed   By: Lynwood Landy Raddle M.D.   On: 10/27/2023 15:07    EKG: Independently reviewed.  A-fib with RVR, no acute ST changes.  Assessment/Plan Principal Problem:   Afib (HCC) Active Problems:   Atrial fibrillation with RVR (HCC)   Right hip pain  (please populate well all problems here in Problem List. (For example, if patient is on BP meds at home and you resume or decide to hold them, it is a problem that needs to be her. Same for CAD, COPD, HLD and so on)  Sepsis, without acute endorgan damage - Sepsis as evidenced by tachycardia, elevated lactic acid and elevated WBC count, source of infection wise, clinically suspect CAP bacterial pneumonia - Clinically patient still appear to be volume contracted, reviewed patient history showed echo showed normal LVEF, but patient has severe hypoalbuminemia and risk of third spacing, and patient received 500 mL in the ED.  Will give another 500 mL crystalloid, also give 1 bottle 25% of IV albumin for hydration. - Patient has no significant hypoxia, will treat CAP with doxycycline .  Check sputum culture, atypical pneumonia study.  CAP, bacterial - As above - Supportive care, change albuterol  to Atrovent for breathing treatment  Hyperkalemia - Secondary to KCl supplement, discontinue KCl supplement  A-fib with RVR - Rate with amiodarone  - Agreed with continue metoprolol  twice daily - Continue Eliquis   Stage IV metastatic pancreatic cancer Cachexia -Continue protein supplement continue megestrol  - Outpatient follow-up with oncology for chemotherapy  Chronic anemia - Most recent iron studies showed a picture compatible with anemia secondary to chronic illness/cancer.  Worsening of right hip pain - X-ray negative for fracture or dislocation, her symptoms is disproportional to the image finding on x-ray, will order a one-time right  hip CT  without contrast to rule out any fracture. - PT evaluation  DVT prophylaxis: Eliquis  Code Status: Full code Family Communication: None at bedside Disposition Plan: Patient is sick with A-fib with RVR requiring IV rate control medications, amiodarone  loading for 48 hours, expect more than 2 midnight hospital stay Consults called: None Admission status: PCU admit   Cort ONEIDA Mana MD [facility] Pager [phone]  10/27/2023, 5:28 PM

## 2023-10-27 NOTE — Progress Notes (Addendum)
 Patient admitted to PCU from ED. Upon arrival, patient placed on continuous cardiac telemetry monitoring. Vital signs obtained and documented. Patient oriented to room, call bell system, and unit routines. Safety measures implemented: bed in lowest position, call bell in reach, and side rails up x3. Patient repositioned for comfort and skin protection. Initial head-to-toes assessment completed. Patient resting comfortably in bed, no acute distress noted at this time.

## 2023-10-27 NOTE — Plan of Care (Signed)
  Problem: Education: Goal: Knowledge of General Education information will improve Description: Including pain rating scale, medication(s)/side effects and non-pharmacologic comfort measures Outcome: Progressing   Problem: Clinical Measurements: Goal: Ability to maintain clinical measurements within normal limits will improve Outcome: Progressing   Problem: Clinical Measurements: Goal: Respiratory complications will improve Outcome: Progressing   Problem: Clinical Measurements: Goal: Cardiovascular complication will be avoided Outcome: Progressing   Problem: Pain Managment: Goal: General experience of comfort will improve and/or be controlled Outcome: Progressing   Problem: Safety: Goal: Ability to remain free from injury will improve Outcome: Progressing

## 2023-10-27 NOTE — Progress Notes (Signed)
  PCCM was called to evaluate patient with a fib with RVR with lactic acid is 2.6 and Low BP.  I recommended that patient be discontinued off Diltiazem drip which is causing low blood pressure and start amiodarone .  I recommend evaluation by  cardiology who also recommended amiodarone .  Patient seen and evaluated and can be admitted by [facility] to PCU. She is alert and awake. vS stable with Amio. Not in distress and feels better.

## 2023-10-27 NOTE — ED Notes (Signed)
 MD verbal order to increase diltiazem gtt to 10mg /hr at this time.

## 2023-10-27 NOTE — Progress Notes (Addendum)
 Pt was admitted for afib rvr and hypotension. Pt was ordered 0.9 % sodium chloride  bolus and albumin. Pt has an ordered for metropolol 12.5 mg tablet. BP at 108/63 Amp 65. NP Foust made aware.  Update 2127: NP Foust instructed RN  staff to administer metropolol at this time.  Update 2323: Pt lactic acid at 1814 was at 3.4 from 2.6. No repeat check order. NP Foust made aware.  Update 2327: See new order.  Update 2340: Lactic at 1.6. NP Foust made aware.

## 2023-10-27 NOTE — ED Triage Notes (Signed)
 Pt to ED via [facility] from home. Home health nurse called out for hypotension. Pt is a cancer pt. Last chemotherapy treatment a few days ago. EMS reports pt was hypotensive with the lowest 70/40. Family reports pt is more lethargic and sluggish in responsiveness. Pt placed on O2 by EMS for decreased oxygen saturation.  EMS vitals  BP 100/60 HR 120-160 afib  SPO2 94% 3L [place]

## 2023-10-27 NOTE — ED Provider Notes (Signed)
 [facility] Provider Note    Event Date/Time   First MD Initiated Contact with Patient 10/27/23 1407     (approximate)  History   Chief Complaint: Hypotension  HPI  Colleen [name] is a 78 y.o. female with a past medical history of asthma, COPD, gastric reflux, hypertension, hyperlipidemia, presents to the emergency department for low blood pressure.  Patient is coming from home, her home health nurse called out today finding the patient's blood pressure to be low in the 80s.  Patient has a history of pancreatic cancer last had chemotherapy several days ago.  EMS states initially when they got to the patient her blood pressure was 70/40 she received almost 500 cc of fluid with EMS and blood pressure on arrival 123/98.  Patient currently satting in the 80s on room air with no baseline home O2 requirement.  Patient placed on 3 L satting in the mid to upper 90s.  Patient states she has noticed progressive worsening shortness of breath over the last week or so.  Denies any significant cough denies any chest pain no fever that she is aware of.  Patient denies any vomiting or diarrhea but states she has been eating and drinking less.  Patient found to be in atrial fibrillation with a rapid ventricular response around 150+ beats per minute, currently it is down to 120s.  Patient has a history of atrial fibrillation.  Is on Eliquis  and metoprolol .  It also appears that the patient is prescribed midodrine  if needed.  Physical Exam   Triage Vital Signs: ED Triage Vitals  Encounter Vitals Group     BP 10/27/23 1409 (!) 123/98     Girls Systolic BP Percentile --      Girls Diastolic BP Percentile --      Boys Systolic BP Percentile --      Boys Diastolic BP Percentile --      Pulse Rate 10/27/23 1409 (!) 196     Resp 10/27/23 1409 (!) 25     Temp 10/27/23 1409 (!) 97.5 F (36.4 C)     Temp src --      SpO2 10/27/23 1409 98 %     Weight 10/27/23 1411 163 lb 2.3 oz (74 kg)      Height 10/27/23 1411 5' 4 (1.626 m)     Head Circumference --      Peak Flow --      Pain Score 10/27/23 1410 0     Pain Loc --      Pain Education --      Exclude from Growth Chart --     Most recent vital signs: Vitals:   10/27/23 1428 10/27/23 1429  BP:    Pulse:    Resp: (!) 23 (!) 25  Temp:    SpO2:      General: Awake, no distress.  CV:  Good peripheral perfusion.  Irregular rhythm around 140 to 150 bpm. Resp:  Normal effort.  Equal breath sounds bilaterally.  Abd:  No distention.  Soft, nontender.  No rebound or guarding.  ED Results / Procedures / Treatments   EKG  EKG viewed and interpreted by myself shows atrial fibrillation with rapid ventricular response around 206 bpm.  Narrow QRS, normal axis, largely normal intervals nonspecific ST changes.  RADIOLOGY  I have reviewed the chest x-ray images.  No consolidation seen on my evaluation.  Patient does have a right sided chest port.   MEDICATIONS ORDERED IN ED: Medications  diltiazem (CARDIZEM) 125 mg in dextrose  5% 125 mL (1 mg/mL) infusion (5 mg/hr Intravenous New Bag/Given 10/27/23 1447)  sodium chloride  0.9 % bolus 500 mL (0 mLs Intravenous Stopped 10/27/23 1444)    IMPRESSION / MDM / ASSESSMENT AND PLAN / ED COURSE  I reviewed the triage vital signs and the nursing notes.  Patient's presentation is most consistent with acute presentation with potential threat to life or bodily function.  Patient presents to the emergency department for weakness found to have low blood pressure in the 70s per EMS.  EMS states initial heart rate around 200 bpm appears to be atrial fibrillation with rapid ventricular response.  Patient has a documented history of atrial fibrillation previously.  However upon arrival to the emergency department patient's blood pressure has increased to around 120 systolic with heart rate around 150 bpm between 130 and 150 bpm.  Patient's lab work has resulted showing leukocytosis of 22,000,  unclear if the patient has received Neupogen  as she did recently receive chemotherapy.  Hemoglobin downtrending down to 8.3.  Patient's chemistry shows mild hyperkalemia at 5.2 no other significant findings.  Patient's troponin slightly elevated at 50 although largely unchanged from historical values.  Patient's chest x-ray is clear.  Patient's pelvis x-ray is clear (patient states she had a fall several weeks ago and continues to have some pain in her hips).  Patient's heart rate will continue to fluctuate between 100 and teens and then on occasion will elevate as high as 200+ beats per minute and what appears to be atrial fibrillation with rapid ventricular response despite being on a diltiazem drip.  During these episodes patient's blood pressure will drop into the 60s or 70s however they are short-lived lasting only minutes at a time and then resolving and the patient's blood pressure improves again to the 1 teens and the heart rate decreases to the 110s.  Patient does appear to be on midodrine  at home we will dose 5 mg of midodrine  in the emergency department.  Patient is also on metoprolol  at home as well as Eliquis .  I spoke to ICU given the patient's intermittent severe tachycardia with hypotension in addition to the patient's complex medical history.  They recommend reaching out to cardiology, patient follows up with Dr. Raford of [facility].  Will discuss with [facility] for further recommendations.  We will also obtain a lactic acid.  ICU states they will see the patient.  CRITICAL CARE Performed by: Franky Moores   Total critical care time: 30 minutes  Critical care time was exclusive of separately billable procedures and treating other patients.  Critical care was necessary to treat or prevent imminent or life-threatening deterioration.  Critical care was time spent personally by me on the following activities: development of treatment plan with patient and/or surrogate as well as nursing,  discussions with consultants, evaluation of patient's response to treatment, examination of patient, obtaining history from patient or surrogate, ordering and performing treatments and interventions, ordering and review of laboratory studies, ordering and review of radiographic studies, pulse oximetry and re-evaluation of patient's condition.   FINAL CLINICAL IMPRESSION(S) / ED DIAGNOSES   Atrial fibrillation rapid ventricular response Hypotension   Note:  This document was prepared using Dragon voice recognition software and may include unintentional dictation errors.   Moores Franky, MD 10/27/23 1558

## 2023-10-27 NOTE — ED Notes (Addendum)
 MD made aware pt HR increased to 201. Diltiazem gtt paused due to BP 69/54. MD aware.

## 2023-10-27 NOTE — ED Notes (Signed)
Pt placed on zoll pads at this time

## 2023-10-28 ENCOUNTER — Telehealth: Payer: Self-pay

## 2023-10-28 DIAGNOSIS — I959 Hypotension, unspecified: Secondary | ICD-10-CM | POA: Diagnosis not present

## 2023-10-28 DIAGNOSIS — M25551 Pain in right hip: Secondary | ICD-10-CM | POA: Diagnosis not present

## 2023-10-28 DIAGNOSIS — D649 Anemia, unspecified: Secondary | ICD-10-CM | POA: Diagnosis not present

## 2023-10-28 DIAGNOSIS — J40 Bronchitis, not specified as acute or chronic: Secondary | ICD-10-CM

## 2023-10-28 DIAGNOSIS — I4892 Unspecified atrial flutter: Secondary | ICD-10-CM | POA: Diagnosis not present

## 2023-10-28 DIAGNOSIS — D638 Anemia in other chronic diseases classified elsewhere: Secondary | ICD-10-CM | POA: Diagnosis not present

## 2023-10-28 DIAGNOSIS — I4891 Unspecified atrial fibrillation: Secondary | ICD-10-CM | POA: Diagnosis not present

## 2023-10-28 LAB — BASIC METABOLIC PANEL WITH GFR
Anion gap: 6 (ref 5–15)
BUN: 17 mg/dL (ref 8–23)
CO2: 21 mmol/L — ABNORMAL LOW (ref 22–32)
Calcium: 8.4 mg/dL — ABNORMAL LOW (ref 8.9–10.3)
Chloride: 111 mmol/L (ref 98–111)
Creatinine, Ser: 0.74 mg/dL (ref 0.44–1.00)
GFR, Estimated: >60 mL/min (ref >60)
Glucose, Bld: 99 mg/dL (ref 70–99)
Potassium: 4.3 mmol/L (ref 3.5–5.1)
Sodium: 138 mmol/L (ref 135–145)

## 2023-10-28 LAB — CBC
HCT: 20.1 % — ABNORMAL LOW (ref 36.0–46.0)
Hemoglobin: 6.8 g/dL — ABNORMAL LOW (ref 12.0–15.0)
MCH: 31.2 pg (ref 26.0–34.0)
MCHC: 33.8 g/dL (ref 30.0–36.0)
MCV: 92.2 fL (ref 80.0–100.0)
Platelets: 125 K/uL — ABNORMAL LOW (ref 150–400)
RBC: 2.18 MIL/uL — ABNORMAL LOW (ref 3.87–5.11)
RDW: 16.6 % — ABNORMAL HIGH (ref 11.5–15.5)
WBC: 12.2 K/uL — ABNORMAL HIGH (ref 4.0–10.5)
nRBC: 0 % (ref 0.0–0.2)

## 2023-10-28 LAB — PREPARE RBC (CROSSMATCH)

## 2023-10-28 LAB — PROCALCITONIN: Procalcitonin: 5.19 ng/mL

## 2023-10-28 LAB — LACTIC ACID, PLASMA: Lactic Acid, Venous: 1.6 mmol/L (ref 0.5–1.9)

## 2023-10-28 LAB — HEMOGLOBIN
Hemoglobin: 6.4 g/dL — ABNORMAL LOW (ref 12.0–15.0)
Hemoglobin: 7.8 g/dL — ABNORMAL LOW (ref 12.0–15.0)

## 2023-10-28 MED ORDER — CHLORHEXIDINE GLUCONATE CLOTH 2 % EX PADS
6.0000 | MEDICATED_PAD | Freq: Every day | CUTANEOUS | Status: DC
  Administered 2023-10-28 – 2023-11-03 (×6): 6 via TOPICAL

## 2023-10-28 MED ORDER — DOXYCYCLINE HYCLATE 100 MG PO TABS
100.0000 mg | ORAL_TABLET | Freq: Two times a day (BID) | ORAL | Status: AC
  Administered 2023-10-28 – 2023-10-31 (×8): 100 mg via ORAL
  Filled 2023-10-28 (×8): qty 1

## 2023-10-28 MED ORDER — SODIUM CHLORIDE 0.9% IV SOLUTION: Freq: Once | INTRAVENOUS | Status: AC

## 2023-10-28 NOTE — Progress Notes (Signed)
 PT Cancellation Note  Patient Details Name: Colleen [name] MRN: [id number] DOB: 11-01-1945   Cancelled Treatment:    Reason Eval/Treat Not Completed: Patient not medically ready. Per chart review pt's most recent Hgb 6.8 with MD requesting PT hold at this time.  Will attempt to see pt at a future date/time as medically appropriate.     CHARM [name] PT, DPT 10/28/23, 8:50 AM

## 2023-10-28 NOTE — Consult Note (Signed)
 Cardiology Consultation   Patient ID: Colleen [name] MRN: [id number]; DOB: [birth year]  Admit date: 10/27/2023 Date of Consult: 10/28/2023  PCP:  Leavy Mole, PA-C   [facility] Providers Cardiologist:  Redell Cave, MD        Patient Profile: Colleen [name] is a 78 y.o. female with a hx of recently diagnosed stage IV metastatic pancreatic cancer on chemotherapy, gout, orthostatic hypotension, COPD, hypertension, hyperlipidemia, and PAF on Eliquis  who is being seen 10/28/2023 for the evaluation of atrial fibrillation with RVR at the request of Dr. Tobie.  History of Present Illness:    Ms. Lalli was initially evaluated by Dr. Cave on 03/2019 for abnormal EKG.  EKG from PCPs office demonstrated PACs.  Patient was admitted 03/2019 to [facility] for palpitations and found to have a hospital admission, she was noted to have paroxysmal atrial fibrillation on telemetry and was discharged on Eliquis .  Echo 03/2019 showed EF 55 to 60% stress test 05/2019 was normal overall low risk study.  ZIO monitor 05/2019 was without evidence of atrial fibrillation.  Patient was seen in our office 08/2023 for follow-up on atrial fibrillation by oncology for stage IV pancreatic adenocarcinoma and was referred for an outpatient Port-A-Cath placement and was noted to have tachycardia after the procedure.  For a while 5 mg x 3 with improvement in her heart evaluation, she was in sinus rhythm with frequent PACs hypokalemia with she was started on p.o. potassium 20 mill equivalents twice daily and metoprolol  to tartrate 25 mg twice daily.  She had recently been taken off antihypertensive medications due to low blood pressure during hospitalization for decreased oral intake and volume depletion.  She had improvement with IV fluids and blood pressure medicines were stopped including hydrochlorothiazide  and enalapril .  Patient was admitted 09/2023 for mucositis and inability to eat or drink.  In the ED, she was  afebrile with blood pressure 86/53 and tachycardic at 100 bpm.  She was started on IV fluids.  Noted to be hypokalemic with potassium 2.5.  Cardiology was consulted for sudden onset tachycardia and she was noted to be in atrial fibrillation with RVR with ventricular rates in the 160s bpm.  She was started on IV amiodarone  and it was felt that she would not tolerate nodal agents secondary to hypotension.  Blood pressure was stabilized with midodrine  and she was tolerating metoprolol  25 mg twice daily while in the hospital.  Echo showed EF 55 to 60% with mildly elevated pulmonary artery systolic pressure, and mild MR.  She was discharged 10/07/2023.    Patient reports that earlier this week she was feeling well and cooking for herself.  On Wednesday, she started to feel overall unwell with generalized malaise, fatigue, weakness, shortness of breath and lightheadedness.  She reports feelings of fevers/chills with productive cough and thick phlegm.  She denies chest pain and palpitations.  She denies bleeding and hematochezia.  Home care nurse found the patient to be hypotensive with BP 70/40 and heart rate of 120s and she was brought to the ED for further evaluation.  In the ED, she also reported a fall previously in the week during which she hit her right hip and complained of worsening hip pain.  In the ED, BP 123/98, HR 196 bpm, RR 25, T97.5 F.  Potassium 5.2, CO2 18, albumin 2.2, WBC 22, Hgb 8.3, HCT 25.  Lactic acid 2.6.  Procalcitonin 5.81.  Troponin minimally elevated and flat trending.  EKG showed atrial fibrillation with RVR around  206 bpm.  Chest x-ray negative.  Patient was started on diltiazem drip with blood pressure dropping into the 60s to 70s systolic, although heart rate improved to 1 teens.  She was also given midodrine  5 mg.  CCM was consulted and recommended discontinuing diltiazem and starting amiodarone  drip.  This improved patient's rates and blood pressure stabilized as well.  Cardiology  was asked to consult for further evaluation of atrial fibrillation.  At time of cardiology consult, patient reports ongoing symptoms of weakness, malaise, lightheadedness, and shortness of breath.  She remains on 2 L supplemental oxygen she denies noticing a change in symptoms with improvement in blood pressure and heart rates.  She is coughing up with thick sputum during exam.  Past Medical History:  Diagnosis Date   Asthma    Cataract    COPD (chronic obstructive pulmonary disease) (HCC)    GERD (gastroesophageal reflux disease)    Gout    History of pulmonary embolus (PE)    Hyperlipidemia    Hypertension    Osteoarthrosis    Prediabetes     Past Surgical History:  Procedure Laterality Date   BACK SURGERY     BREAST BIOPSY Left 04/08/2014   intraductal papilloma 5 mm   COLONOSCOPY WITH PROPOFOL  N/A 04/23/2020   Procedure: COLONOSCOPY WITH PROPOFOL ;  Surgeon: Janalyn Keene NOVAK, MD;  Location: [facility] ENDOSCOPY;  Service: Endoscopy;  Laterality: N/A;   ERCP N/A 08/05/2023   Procedure: ERCP, WITH INTERVENTION IF INDICATED;  Surgeon: Jinny Carmine, MD;  Location: [facility] ENDOSCOPY;  Service: Endoscopy;  Laterality: N/A;   ESOPHAGOGASTRODUODENOSCOPY (EGD) WITH PROPOFOL  N/A 04/23/2020   Procedure: ESOPHAGOGASTRODUODENOSCOPY (EGD) WITH PROPOFOL ;  Surgeon: Janalyn Keene NOVAK, MD;  Location: [facility] ENDOSCOPY;  Service: Endoscopy;  Laterality: N/A;   GIVENS CAPSULE STUDY N/A 01/28/2022   Procedure: GIVENS CAPSULE STUDY;  Surgeon: Therisa Bi, MD;  Location: [facility] ENDOSCOPY;  Service: Gastroenterology;  Laterality: N/A;   IR IMAGING GUIDED PORT INSERTION  09/12/2023   IR INT EXT BILIARY DRAIN WITH CHOLANGIOGRAM  08/08/2023   IR RADIOLOGIST EVAL & MGMT  09/22/2023   IR REMOVAL BILIARY DRAIN  09/22/2023   LUMBAR LAMINECTOMY     TONSILLECTOMY         Scheduled Meds:  allopurinol   200 mg Oral Daily   apixaban   5 mg Oral BID   budesonide -glycopyrrolate -formoterol   2 puff Inhalation BID    Chlorhexidine  Gluconate Cloth  6 each Topical Daily   famotidine   10 mg Oral QHS   guaiFENesin   600 mg Oral BID   megestrol   20 mg Oral BID   metoprolol  tartrate  12.5 mg Oral BID   midodrine   5 mg Oral TID WC   simethicone   80 mg Oral BID   Continuous Infusions:  amiodarone  30 mg/hr (10/28/23 0354)   doxycycline  (VIBRAMYCIN ) IV 100 mg (10/27/23 2119)   PRN Meds: acetaminophen  **OR** acetaminophen , HYDROmorphone  (DILAUDID ) injection, ipratropium, magic mouthwash, ondansetron  **OR** ondansetron  (ZOFRAN ) IV  Allergies:    Allergies  Allergen Reactions   Fentanyl  Swelling    It messes with her heart.    Nsaids     Other Reaction(s): Not available   Oxycodone  Swelling    Social History:   Social History   Socioeconomic History   Marital status: Widowed    Spouse name: Susann   Number of children: 0   Years of education: Not on file   Highest education level: 12th grade  Occupational History   Occupation: Retired  Tobacco Use   Smoking  status: Former    Current packs/day: 0.00    Average packs/day: 1.5 packs/day for 20.0 years (30.0 ttl pk-yrs)    Types: Cigarettes    Start date: 11/13/1988    Quit date: 11/13/2008    Years since quitting: 14.9   Smokeless tobacco: Never   Tobacco comments:    smoking cessation materials not required  Vaping Use   Vaping status: Never Used  Substance and Sexual Activity   Alcohol use: Not Currently   Drug use: No   Sexual activity: Not Currently  Other Topics Concern   Not on file  Social History Narrative   Pt lives alone   Social Drivers of Health   Financial Resource Strain: Low Risk  (12/02/2022)   Overall Financial Resource Strain (CARDIA)    Difficulty of Paying Living Expenses: Not very hard  Food Insecurity: Food Insecurity Present (10/01/2023)   Hunger Vital Sign    Worried About Running Out of Food in the Last Year: Never true    Ran Out of Food in the Last Year: Sometimes true  Transportation Needs: Unmet  Transportation Needs (10/01/2023)   PRAPARE - Administrator, Civil Service (Medical): Yes    Lack of Transportation (Non-Medical): No  Physical Activity: Insufficiently Active (12/02/2022)   Exercise Vital Sign    Days of Exercise per Week: 2 days    Minutes of Exercise per Session: 60 min  Stress: No Stress Concern Present (12/02/2022)   [employer] of Occupational Health - Occupational Stress Questionnaire    Feeling of Stress : Not at all  Social Connections: Socially Isolated (10/01/2023)   Social Connection and Isolation Panel    Frequency of Communication with Friends and Family: More than three times a week    Frequency of Social Gatherings with Friends and Family: More than three times a week    Attends Religious Services: Never    Database administrator or Organizations: No    Attends Banker Meetings: Never    Marital Status: Widowed  Intimate Partner Violence: Not At Risk (10/01/2023)   Humiliation, Afraid, Rape, and Kick questionnaire    Fear of Current or Ex-Partner: No    Emotionally Abused: No    Physically Abused: No    Sexually Abused: No    Family History:    Family History  Problem Relation Age of Onset   Diabetes Mother    Lung cancer Father    Diabetes Sister    Congestive Heart Failure Sister    Glaucoma Sister    Breast cancer Maternal Aunt      ROS:  Please see the history of present illness.   All other ROS reviewed and negative.     Physical Exam/Data: Vitals:   10/27/23 2127 10/27/23 2238 10/27/23 2314 10/28/23 0259  BP:  (!) 93/55 (!) 100/55 (!) 101/53  Pulse: (!) 106 77 88 74  Resp:  18 18   Temp:  98 F (36.7 C) 98.7 F (37.1 C) 98.8 F (37.1 C)  TempSrc:  Oral  Oral  SpO2:  99% 100% 100%  Weight:      Height:        Intake/Output Summary (Last 24 hours) at 10/28/2023 0736 Last data filed at 10/28/2023 0400 Gross per 24 hour  Intake 426.26 ml  Output --  Net 426.26 ml      10/27/2023    2:11 PM  10/25/2023    9:40 AM 10/19/2023    8:00 AM  Last 3 Weights  Weight (lbs) 163 lb 2.3 oz 162 lb 166 lb 3.2 oz  Weight (kg) 74 kg 73.483 kg 75.388 kg     Body mass index is 28 kg/m.  General: Frail appearing, in no acute distress HEENT: normal Neck: no JVD Vascular: No carotid bruits; Distal pulses 2+ bilaterally Cardiac:  normal S1, S2; irregular rhythm, regular rate; no murmur  Lungs: Lung sounds diminished throughout with overall poor inspiratory effort Abd: soft, nontender, no hepatomegaly  Ext: 1+ doughy edema Skin: warm and dry  Psych:  Normal affect   EKG:  The EKG was personally reviewed and demonstrates: Atrial fibrillation, rate 206 bpm Telemetry:  Telemetry was personally reviewed and demonstrates: Wandering atrial pacemaker versus normal sinus rhythm with frequent PACs, rate 60 to 100 bpm  Relevant CV Studies:  10/03/2023 Echo complete 1. Left ventricular ejection fraction, by estimation, is 55 to 60%. The  left ventricle has normal function. The left ventricle has no regional  wall motion abnormalities. Left ventricular diastolic parameters are  indeterminate.   2. Right ventricular systolic function is normal. The right ventricular  size is normal. There is mildly elevated pulmonary artery systolic  pressure.   3. Left atrial size was mildly dilated.   4. Right atrial size was mildly dilated.   5. The mitral valve is normal in structure. Mild mitral valve  regurgitation. No evidence of mitral stenosis.   6. Tricuspid valve regurgitation is moderate.   7. The aortic valve is normal in structure. Aortic valve regurgitation is  not visualized. No aortic stenosis is present.   8. The inferior vena cava is dilated in size with >50% respiratory  variability, suggesting right atrial pressure of 8 mmHg.   Laboratory Data: High Sensitivity Troponin:   Recent Labs  Lab 10/01/23 1356 10/01/23 2122 10/27/23 1417 10/27/23 1626  TROPONINIHS 41* 35* 50* 44*      Chemistry Recent Labs  Lab 10/25/23 0918 10/27/23 1417 10/28/23 0410  NA 133* 134* 138  K 4.3 5.2* 4.3  CL 108 106 111  CO2 19* 18* 21*  GLUCOSE 112* 131* 99  BUN 17 19 17   CREATININE 0.85 0.78 0.74  CALCIUM  9.1 8.5* 8.4*  GFRNONAA >60 >60 >60  ANIONGAP 6 10 6     Recent Labs  Lab 10/25/23 0918 10/27/23 1417  PROT 6.6 6.6  ALBUMIN 2.5* 2.2*  AST 31 48*  ALT 16 15  ALKPHOS 105 106  BILITOT 1.0 1.8*   Lipids No results for input(s): CHOL, TRIG, HDL, LABVLDL, LDLCALC, CHOLHDL in the last 168 hours.  Hematology Recent Labs  Lab 10/25/23 0918 10/27/23 1417 10/28/23 0410  WBC 12.6* 22.0* 12.2*  RBC 2.86* 2.69* 2.18*  HGB 8.7* 8.3* 6.8*  HCT 25.6* 25.0* 20.1*  MCV 89.5 92.9 92.2  MCH 30.4 30.9 31.2  MCHC 34.0 33.2 33.8  RDW 16.8* 17.1* 16.6*  PLT 190 181 125*   Thyroid  No results for input(s): TSH, FREET4 in the last 168 hours.  BNPNo results for input(s): BNP, PROBNP in the last 168 hours.  DDimer No results for input(s): DDIMER in the last 168 hours.  Radiology/Studies:  CT HIP RIGHT WO CONTRAST Result Date: 10/27/2023 IMPRESSION: 1. No acute osseous abnormality. 2. Moderate osteoarthritis of the right hip. 3. Redemonstrated serpiginous sclerotic change along the anteromedial right femoral head, which could reflect avascular necrosis. No evidence of subchondral collapse. Electronically Signed   By: Harrietta Sherry M.D.   On: 10/27/2023 18:22   DG Chest Portable  1 View Result Date: 10/27/2023 IMPRESSION: No active disease. Electronically Signed   By: Lynwood Landy Raddle M.D.   On: 10/27/2023 15:09   DG Pelvis Portable Result Date: 10/27/2023 IMPRESSION: Negative. Electronically Signed   By: Lynwood Landy Raddle M.D.   On: 10/27/2023 15:07    Assessment and Plan:  Atrial fibrillation with RVR - Known history of atrial fibrillation, on Eliquis  and low-dose metoprolol  at home in addition to as needed midodrine  for low BP - Echo 09/2023 showed  normal LV systolic function with mildly elevated pulmonary artery systolic pressure - Presenting in 10/2 with shortness of breath, weakness, and cough - Initial EKG showing atrial fibrillation with rate over 200 bpm - A-fib likely in the setting of acute respiratory illness - Patient was initially started on diltiazem drip with blood pressure dropping to 60s to 70s systolic.  This was discontinued and she was started on IV amiodarone  with improvement in rates and stabilization of blood pressure. - Telemetry reviewed and shows wandering atrial pacemaker vs normal sinus rhythm with frequent PACs, rate 60 to 100 bpm - Recommend continuing amiodarone  infusion for now, can consider transitioning to oral once respiratory status is more stable - Continue metoprolol  tartrate 12.5 mg twice daily - Will hold Eliquis  given anemia, see below  Sepsis Possible pneumonia Acute hypoxic respiratory failure - Patient reports cough, thick sputum, fever/chills - Chest x-ray without signs of infection - Patient remains on 2 L supplemental oxygen - Ongoing management per IM  Acute on chronic anemia - Hemoglobin 6.8 today, down from 8.3 yesterday - Patient denies symptoms concerning for bleeding - Would likely benefit from transfusion  Hypotension - Blood pressure low on presentation, worsened with Cardizem.  Now improved with amiodarone  infusion. - BP overall stable on midodrine  5 mg 3 times daily  Hyperkalemia - K 5.2 on presentation, likely due to oversupplementation at home, improved to 4.3 today  Stage IV metastatic pancreatic cancer - Follows with oncology as outpatient    Risk Assessment/Risk Scores:      CHA2DS2-VASc Score = 5   This indicates a 7.2% annual risk of stroke. The patient's score is based upon: CHF History: 0 HTN History: 1 Diabetes History: 1 Stroke History: 0 Vascular Disease History: 0 Age Score: 2 Gender Score: 1      For questions or updates, please contact  [facility] Please consult [email or web address] for contact info under      Signed, Lesley LITTIE Maffucci, PA-C  10/28/2023 7:36 AM

## 2023-10-28 NOTE — Progress Notes (Signed)
 [facility]  - [place] at [facility]   PATIENT NAME: Colleen [name]    MR#:  [id number]  DATE OF BIRTH:  08-11-1945  SUBJECTIVE:  no family at bedside. Patient came in after she had increasing weakness fall pain on the right hip and known to have elevated heartrate. Admitted with productive cough no fever. Chest x-ray negative. Appears frail and feeble    VITALS:  Blood pressure 98/60, pulse 80, temperature 98.8 F (37.1 C), temperature source Oral, resp. rate 17, height 5' 4 (1.626 m), weight 74 kg, SpO2 98%.  PHYSICAL EXAMINATION:   GENERAL:  78 y.o.-year-old patient with no acute distress. Appears frail and feeble LUNGS: decreased breath sounds bilaterally, no wheezing CARDIOVASCULAR: S1, S2 normal. No murmur   ABDOMEN: Soft, nontender, nondistended. Bowel sounds present.  EXTREMITIES: No  edema b/l.    NEUROLOGIC: nonfocal  patient is alert and awake weak, deconditioned  LABORATORY PANEL:  CBC Recent Labs  Lab 10/28/23 0410 10/28/23 1121  WBC 12.2*  --   HGB 6.8* 6.4*  HCT 20.1*  --   PLT 125*  --     Chemistries  Recent Labs  Lab 10/27/23 1417 10/28/23 0410  NA 134* 138  K 5.2* 4.3  CL 106 111  CO2 18* 21*  GLUCOSE 131* 99  BUN 19 17  CREATININE 0.78 0.74  CALCIUM  8.5* 8.4*  AST 48*  --   ALT 15  --   ALKPHOS 106  --   BILITOT 1.8*  --    Cardiac Enzymes No results for input(s): TROPONINI in the last 168 hours. RADIOLOGY:  CT HIP RIGHT WO CONTRAST Result Date: 10/27/2023 CLINICAL DATA:  Hip trauma, fracture suspected, xray done EXAM: CT OF THE RIGHT HIP WITHOUT CONTRAST TECHNIQUE: Multidetector CT imaging of the right hip was performed according to the standard protocol. Multiplanar CT image reconstructions were also generated. RADIATION DOSE REDUCTION: This exam was performed according to the departmental dose-optimization program which includes automated exposure control, adjustment of the mA and/or kV according to patient size  and/or use of iterative reconstruction technique. COMPARISON:  PET-CT dated 08/22/2023. FINDINGS: Bones/Joint/Cartilage No acute fracture or dislocation. The right femoral head is seated within the acetabulum. Moderate osteoarthritis of the right hip manifested by joint space narrowing and subchondral cystic changes of the superior acetabulum. Redemonstrated serpiginous sclerotic change along the anteromedial right femoral head, which could reflect avascular necrosis. No evidence of subchondral collapse. 11 mm well corticated ossicle adjacent to the right greater trochanter at the level of the intertrochanteric fossa. The visualized sacroiliac joints and pubic symphysis are intact with mild-to-moderate degenerative arthropathy. Ligaments Ligaments are suboptimally evaluated by CT. Muscles and Tendons No appreciable acute abnormality. Soft tissue No fluid collection or hematoma.  No soft tissue mass. Other: Fibroid uterus. IMPRESSION: 1. No acute osseous abnormality. 2. Moderate osteoarthritis of the right hip. 3. Redemonstrated serpiginous sclerotic change along the anteromedial right femoral head, which could reflect avascular necrosis. No evidence of subchondral collapse. Electronically Signed   By: Harrietta Sherry M.D.   On: 10/27/2023 18:22   DG Chest Portable 1 View Result Date: 10/27/2023 CLINICAL DATA:  Shortness of breath. EXAM: PORTABLE CHEST 1 VIEW COMPARISON:  August 23, 2021. FINDINGS: Stable cardiomediastinal silhouette. Right internal jugular Port-A-Cath is noted. Lungs are clear. Degenerative changes are seen involving both shoulder joints. IMPRESSION: No active disease. Electronically Signed   By: Lynwood Landy Raddle M.D.   On: 10/27/2023 15:09   DG Pelvis Portable Result Date:  10/27/2023 CLINICAL DATA:  Fall. EXAM: PORTABLE PELVIS 1-2 VIEWS COMPARISON:  None Available. FINDINGS: There is no evidence of pelvic fracture or diastasis. No pelvic bone lesions are seen. IMPRESSION: Negative.  Electronically Signed   By: Lynwood Landy Raddle M.D.   On: 10/27/2023 15:07    Assessment and Plan   Colleen [name] is a 78 y.o. female with medical history significant of recently diagnosed PAF, stage IV metastatic pancreatic cancer on chemotherapy, gout, orthostatic hypotension, GERD, sent by home care nurse for evaluation of hypotension and rapid A-fib.  Patient was found to have hypotension this morning by home care nurse, when her blood pressure reading was 70/40 and patient did complain about feeling of lightheadedness. Heart rate was found to be in the 120s.  In the ER, Patient was found to be in rapid A-fib, Cardizem drip was started in the ED however blood pressure further dropped to 69/54, Cardizem drip stopped and patient was started on amiodarone  drip and blood pressure improved to 114/61.   Sepsis, without acute endorgan damage - Sepsis as evidenced by tachycardia, elevated lactic acid and elevated WBC count, source of infection wise, clinically suspect bronchitis alothough CXR neg and only procal is elevated 5.1 --sputum small colony of GPC - Clinically patient still appear to be volume contracted, reviewed patient history showed echo showed normal LVEF, but patient has severe hypoalbuminemia  --received IVF in ER --- Patient has no significant hypoxia    Hyperkalemia - Secondary to KCl supplement, discontinue KCl supplement   A-fib with RVR - Rate control with IV with amiodarone  - Agreed with continue metoprolol  twice daily - Continue Eliquis  --pt to be seen by [facility]   Stage IV metastatic pancreatic cancer Cachexia -Continue protein supplement continue megestrol  - Outpatient follow-up with oncology for chemotherapy   Chronic anemia - Most recent iron studies showed a picture compatible with anemia secondary to chronic illness/cancer. --hgb down to 6.8--pt agreeable for BT   Worsening of right hip pain - X-ray negative for fracture or dislocation, her symptoms is  disproportional to the image finding on x-ray -- right hip CT without contrast  shows moderate OA - PT evaluation   DVT prophylaxis: Eliquis  Code Status: Full code Family Communication: None at bedside Disposition Plan: Patient is sick with A-fib with RVR requiring IV rate control medications, amiodarone  loading for 48 hours, expect more than 2 midnight hospital stay Level of care: Progressive Status is: Inpatient Remains inpatient appropriate because: afib, bronchitis, fall, anemia    TOTAL TIME TAKING CARE OF THIS PATIENT: 40 minutes.  >50% time spent on counselling and coordination of care  Note: This dictation was prepared with Dragon dictation along with smaller phrase technology. Any transcriptional errors that result from this process are unintentional.  Leita Blanch M.D    [facility]   CC: Primary care physician; Leavy Mole, PA-C

## 2023-10-28 NOTE — Telephone Encounter (Signed)
 Copied from CRM [id number]. Topic: Clinical - Home Health Verbal Orders >> Oct 28, 2023  3:24 PM Ivette P wrote: Caller/Agency:  [facility]  Callback Number: [id number] - secured line  Service Requested: Skilled Nursing  Any new concerns about the patient? Yes, pt was sent to ER, pt had low blood pressure 70/40 , complaining of dizziness and couldn't stand up. Hs been taking medication, heart rate ws 152  Pt is living alone and needs someone to help her take medication

## 2023-10-28 NOTE — TOC Initial Note (Signed)
 Transition of Care [facility]) - Initial/Assessment Note    Patient Details  Name: Colleen [name] MRN: [id number] Date of Birth: 28-Jul-1945  Transition of Care [facility]) CM/SW Contact:    Lauraine JAYSON Carpen, LCSW Phone Number: 10/28/2023, 4:06 PM  Clinical Narrative:   Readmission prevention screen complete. CSW met with patient. No family at bedside. CSW introduced role and explained that discharge planning would be discussed. PCP is Michelene Cower, PA-C. Niece/HCPOA drives her to appointments. Pharmacy is [facility] [address] [employer] [address] [employer]. No issues affording medications. Patient lives home alone. She is active with [facility] for PT, RN, aide, SW. She has a RW, cane, and BSC at home. Patient confirmed she does not wear oxygen at home. Will follow along for this potential discharge need. No further concerns. CSW will continue to follow patient for support and facilitate return home once stable. Niece will transport her home at discharge.               Expected Discharge Plan: Home w Home Health Services Barriers to Discharge: Continued Medical Work up   Patient Goals and CMS Choice            Expected Discharge Plan and Services     Post Acute Care Choice: Resumption of Svcs/PTA Provider Living arrangements for the past 2 months: Single Family Home                           HH Arranged: RN, PT, Nurse's Aide, Social Work [facility] Agency: [facility] Date HH Agency Contacted: 10/28/23   Representative spoke with at [facility] Agency: Daphne  Prior Living Arrangements/Services Living arrangements for the past 2 months: Single Family Home Lives with:: Self Patient language and need for interpreter reviewed:: Yes Do you feel safe going back to the place where you live?: Yes      Need for Family Participation in Patient Care: Yes (Comment) Care giver support system in place?: Yes (comment) Current home services: DME, Homehealth aide, Home PT, Home RN, Other (comment) (Arts development officer) Criminal Activity/Legal Involvement Pertinent to Current Situation/Hospitalization: No - Comment as needed  Activities of Daily Living      Permission Sought/Granted Permission sought to share information with : Facility Industrial/product designer granted to share information with : Yes, Verbal Permission Granted     Permission granted to share info w AGENCY: [facility]        Emotional Assessment Appearance:: Appears stated age Attitude/Demeanor/Rapport: Engaged, Gracious Affect (typically observed): Accepting, Appropriate, Calm, Pleasant Orientation: : Oriented to Self, Oriented to Place, Oriented to  Time, Oriented to Situation Alcohol / Substance Use: Not Applicable Psych Involvement: No (comment)  Admission diagnosis:  Afib (HCC) [I48.91] Atrial fibrillation with rapid ventricular response (HCC) [I48.91] Hypotension, unspecified hypotension type [I95.9] Patient Active Problem List   Diagnosis Date Noted   Bronchitis 10/28/2023   Right hip pain 10/27/2023   Afib (HCC) 10/27/2023   Mucositis due to chemotherapy 10/05/2023   Odynophagia 10/03/2023   Pressure injury of skin 10/03/2023   Hypotension 10/02/2023   Dysphasia 10/01/2023   Dysphagia 10/01/2023   Primary pancreatic cancer with metastasis to other site (HCC) 09/07/2023   Protein-calorie malnutrition, moderate 09/01/2023   Hyponatremia 08/30/2023   Abnormal LFTs 08/04/2023   Chronic kidney disease, stage 3a (HCC) 08/04/2023   Obesity (BMI 30-39.9) 08/04/2023   Atrial fibrillation, chronic (HCC) 08/04/2023   Pulmonary embolism (HCC) 08/04/2023   Hypokalemia 08/04/2023  Pancreatic mass 08/04/2023   New onset type 2 diabetes mellitus (HCC) 04/04/2023   Acquired trigger finger of right middle finger 07/24/2021   Acquired trigger finger of right ring finger 07/24/2021   Trigger finger of right hand 06/26/2021   Hidradenitis suppurativa 11/12/2020   Status post gastroplasty-1980s  06/25/2020   CLE (columnar lined esophagus)    Hiatal hernia    Gastric nodule    Angiodysplasia of stomach and duodenum    Cecal polyp    Polyp of descending colon    Polyp of transverse colon    Atrial fibrillation with RVR (HCC) 06/14/2019   Current use of anticoagulant therapy 06/14/2019   Iron deficiency anemia 06/14/2019   Essential (hemorrhagic) thrombocythemia (HCC) 06/14/2019   GERD (gastroesophageal reflux disease) 04/15/2019   Chronic low back pain 04/15/2019   Right pulmonary embolus 04/15/2019   DOE (dyspnea on exertion) 03/13/2019   Anemia of chronic disease 03/07/2019   Atherosclerosis of both carotid arteries 09/20/2018   Spinal stenosis of lumbar region with neurogenic claudication 09/20/2018   Aortic atherosclerosis 09/05/2018   At high risk for falls 09/05/2018   Osteopenia 05/18/2017   Stage 3 chronic kidney disease (HCC) 04/21/2017   Morbid obesity (HCC) 04/21/2017   Trigger middle finger of left hand 02/28/2017   Chronic gouty arthropathy without tophi 11/29/2016   Hypergammaglobulinemia 11/29/2016   Osteoarthritis of both knees 11/29/2016   Bilateral primary osteoarthritis of knee 11/29/2016   Allergic rhinitis, seasonal 07/30/2014   Gout 07/30/2014   HLD (hyperlipidemia) 07/30/2014   Osteoarthritis of knee 07/30/2014   Prediabetes 07/30/2014   Asthma, mild persistent 07/30/2014   COPD (chronic obstructive pulmonary disease) (HCC) 07/09/2008   Benign essential HTN 06/22/2006   PCP:  Leavy Mole, PA-C Pharmacy:   [facility] [address] ([address]), [place] - [address] [place]) [place] [address] Phone: [phone] Fax: [phone]     Social Drivers of Health (SDOH) Social History: SDOH Screenings   Food Insecurity: Food Insecurity Present (10/01/2023)  Housing: Low Risk  (10/01/2023)  Transportation Needs: Unmet Transportation Needs (10/01/2023)  Utilities: At Risk (10/01/2023)  Alcohol Screen: Low  Risk  (12/02/2022)  Depression (PHQ2-9): Low Risk  (10/12/2023)  Financial Resource Strain: Low Risk  (12/02/2022)  Physical Activity: Insufficiently Active (12/02/2022)  Social Connections: Socially Isolated (10/01/2023)  Stress: No Stress Concern Present (12/02/2022)  Tobacco Use: Medium Risk (10/27/2023)  Health Literacy: Adequate Health Literacy (12/02/2022)   SDOH Interventions:     Readmission Risk Interventions    10/28/2023    4:03 PM 10/03/2023    3:20 PM  Readmission Risk Prevention Plan  Transportation Screening Complete Complete  Medication Review Oceanographer) Complete Complete  PCP or Specialist appointment within 3-5 days of discharge Complete Complete  HRI or Home Care Consult Complete   SW Recovery Care/Counseling Consult Complete Complete  Palliative Care Screening Not Applicable Not Applicable  Skilled Nursing Facility Not Applicable Not Applicable

## 2023-10-28 NOTE — Progress Notes (Signed)
   10/28/23 1330  Spiritual Encounters  Type of Visit Initial  Care provided to: Pt and family (Sister at bedside)  Referral source Chaplain assessment  Reason for visit Routine spiritual support  OnCall Visit No  Spiritual Framework  Presenting Themes Goals in life/care;Coping tools;Impactful experiences and emotions  Patient Stress Factors Exhausted;Other (Comment) (Pt stated she felt like she'd feel better if she got some sleep)  Interventions  Spiritual Care Interventions Made Established relationship of care and support;Compassionate presence;Reflective listening;Normalization of emotions;Prayer;Encouragement  Intervention Outcomes  Outcomes Connection to spiritual care;Awareness around self/spiritual resourses;Awareness of support

## 2023-10-28 NOTE — Plan of Care (Signed)

## 2023-10-29 ENCOUNTER — Inpatient Hospital Stay: Admit: 2023-10-29 | Discharge: 2023-10-29 | Disposition: A | Attending: Internal Medicine | Admitting: Internal Medicine

## 2023-10-29 DIAGNOSIS — M25551 Pain in right hip: Secondary | ICD-10-CM | POA: Diagnosis not present

## 2023-10-29 DIAGNOSIS — I4719 Other supraventricular tachycardia: Secondary | ICD-10-CM | POA: Diagnosis not present

## 2023-10-29 DIAGNOSIS — I498 Other specified cardiac arrhythmias: Secondary | ICD-10-CM

## 2023-10-29 DIAGNOSIS — D62 Acute posthemorrhagic anemia: Secondary | ICD-10-CM

## 2023-10-29 DIAGNOSIS — D638 Anemia in other chronic diseases classified elsewhere: Secondary | ICD-10-CM | POA: Diagnosis not present

## 2023-10-29 DIAGNOSIS — J40 Bronchitis, not specified as acute or chronic: Secondary | ICD-10-CM | POA: Diagnosis not present

## 2023-10-29 DIAGNOSIS — I471 Supraventricular tachycardia, unspecified: Secondary | ICD-10-CM | POA: Diagnosis not present

## 2023-10-29 DIAGNOSIS — I4892 Unspecified atrial flutter: Secondary | ICD-10-CM | POA: Diagnosis not present

## 2023-10-29 DIAGNOSIS — R579 Shock, unspecified: Secondary | ICD-10-CM | POA: Diagnosis not present

## 2023-10-29 DIAGNOSIS — I4891 Unspecified atrial fibrillation: Secondary | ICD-10-CM | POA: Diagnosis not present

## 2023-10-29 DIAGNOSIS — E872 Acidosis, unspecified: Secondary | ICD-10-CM

## 2023-10-29 LAB — TYPE AND SCREEN
ABO/RH(D): A POS
Antibody Screen: NEGATIVE
Unit division: 0

## 2023-10-29 LAB — ECHOCARDIOGRAM LIMITED
Height: 64 in
S' Lateral: 3.2 cm
Single Plane A4C EF: 59.1 %
Weight: 2610.25 [oz_av]

## 2023-10-29 LAB — BPAM RBC
Blood Product Expiration Date: 202511022359
ISSUE DATE / TIME: 202510031256
Unit Type and Rh: 6200

## 2023-10-29 MED ORDER — AMIODARONE HCL 200 MG PO TABS
400.0000 mg | ORAL_TABLET | Freq: Two times a day (BID) | ORAL | Status: AC
  Administered 2023-10-29 – 2023-11-01 (×7): 400 mg via ORAL
  Filled 2023-10-29 (×7): qty 2

## 2023-10-29 MED ORDER — AMIODARONE HCL 200 MG PO TABS
200.0000 mg | ORAL_TABLET | Freq: Every day | ORAL | Status: DC
Start: 2023-11-05 — End: 2023-11-04

## 2023-10-29 MED ORDER — AMIODARONE HCL 200 MG PO TABS
200.0000 mg | ORAL_TABLET | Freq: Two times a day (BID) | ORAL | Status: DC
  Administered 2023-11-01 – 2023-11-04 (×6): 200 mg via ORAL
  Filled 2023-10-29 (×6): qty 1

## 2023-10-29 NOTE — Plan of Care (Signed)

## 2023-10-29 NOTE — Care Management Important Message (Signed)
 Important Message  Patient Details  Name: Colleen [name] MRN: [id number] Date of Birth: [birth year]   Important Message Given:  Yes - Medicare IM     [name] 10/29/2023, 1:42 PM

## 2023-10-29 NOTE — Progress Notes (Signed)
 PT Cancellation Note  Patient Details Name: Colleen [name] MRN: [id number] DOB: 12-04-1945   Cancelled Treatment:    Reason Eval/Treat Not Completed: Patient declined, no reason specified (Consult received and chart reviewed.  Evaluation attempted. Patient adamantly declined participation with therapy, they had me up half the night. Unable to redirect despite encouragement. Will continue efforts at later date/time as appropriate.)   Satoya Feeley H. Delores, PT, DPT, NCS 10/29/23, 8:30 AM [phone]

## 2023-10-29 NOTE — Plan of Care (Signed)

## 2023-10-29 NOTE — Progress Notes (Signed)
 Rounding Note   Patient Name: Colleen [name] Date of Encounter: 10/29/2023  [facility] Cardiologist: Redell Cave, MD   Subjective Patient reports continued improvement today.  She does endorse blood in her stools.  Hemoglobin dropped to 6.4 yesterday and she is s/p 1 unit PRBCs.  She was weaned off supplemental oxygen and denies shortness of breath.  No chest pain or palpitations.  Scheduled Meds:  allopurinol   200 mg Oral Daily   budesonide -glycopyrrolate -formoterol   2 puff Inhalation BID   Chlorhexidine  Gluconate Cloth  6 each Topical Daily   doxycycline   100 mg Oral Q12H   famotidine   10 mg Oral QHS   guaiFENesin   600 mg Oral BID   megestrol   20 mg Oral BID   metoprolol  tartrate  12.5 mg Oral BID   midodrine   5 mg Oral TID WC   simethicone   80 mg Oral BID   Continuous Infusions:  amiodarone  30 mg/hr (10/29/23 0414)   PRN Meds: acetaminophen  **OR** acetaminophen , HYDROmorphone  (DILAUDID ) injection, ipratropium, magic mouthwash, ondansetron  **OR** ondansetron  (ZOFRAN ) IV   Vital Signs  Vitals:   10/28/23 2100 10/28/23 2312 10/29/23 0434 10/29/23 0903  BP: 134/63 102/60 (!) 125/55 (!) 104/59  Pulse: 86 83 67 89  Resp: 17 17 18 18   Temp: 98.7 F (37.1 C) 98.3 F (36.8 C) 98 F (36.7 C) 98.4 F (36.9 C)  TempSrc:    Oral  SpO2: 100% 100% 100% 99%  Weight:      Height:        Intake/Output Summary (Last 24 hours) at 10/29/2023 1029 Last data filed at 10/28/2023 1716 Gross per 24 hour  Intake 496 ml  Output --  Net 496 ml      10/27/2023    2:11 PM 10/25/2023    9:40 AM 10/19/2023    8:00 AM  Last 3 Weights  Weight (lbs) 163 lb 2.3 oz 162 lb 166 lb 3.2 oz  Weight (kg) 74 kg 73.483 kg 75.388 kg      Telemetry Wandering atrial pacemaker- Personally Reviewed  Physical Exam  General: Frail appearing, in no acute distress HEENT: normal Neck: no JVD Vascular: No carotid bruits; Distal pulses 2+ bilaterally Cardiac:  normal S1, S2; irregular  rhythm, regular rate; no murmur  Lungs: Lung sounds diminished throughout with overall poor inspiratory effort Abd: soft, nontender, no hepatomegaly  Ext: 1+ doughy edema Skin: warm and dry  Psych:  Normal affect   Labs High Sensitivity Troponin:   Recent Labs  Lab 10/01/23 1356 10/01/23 2122 10/27/23 1417 10/27/23 1626  TROPONINIHS 41* 35* 50* 44*     Chemistry Recent Labs  Lab 10/25/23 0918 10/27/23 1417 10/28/23 0410  NA 133* 134* 138  K 4.3 5.2* 4.3  CL 108 106 111  CO2 19* 18* 21*  GLUCOSE 112* 131* 99  BUN 17 19 17   CREATININE 0.85 0.78 0.74  CALCIUM  9.1 8.5* 8.4*  PROT 6.6 6.6  --   ALBUMIN 2.5* 2.2*  --   AST 31 48*  --   ALT 16 15  --   ALKPHOS 105 106  --   BILITOT 1.0 1.8*  --   GFRNONAA >60 >60 >60  ANIONGAP 6 10 6     Lipids No results for input(s): CHOL, TRIG, HDL, LABVLDL, LDLCALC, CHOLHDL in the last 168 hours.  Hematology Recent Labs  Lab 10/25/23 0918 10/27/23 1417 10/27/23 1417 10/28/23 0410 10/28/23 1121 10/28/23 1645  WBC 12.6* 22.0*  --  12.2*  --   --  RBC 2.86* 2.69*  --  2.18*  --   --   HGB 8.7* 8.3*   < > 6.8* 6.4* 7.8*  HCT 25.6* 25.0*  --  20.1*  --   --   MCV 89.5 92.9  --  92.2  --   --   MCH 30.4 30.9  --  31.2  --   --   MCHC 34.0 33.2  --  33.8  --   --   RDW 16.8* 17.1*  --  16.6*  --   --   PLT 190 181  --  125*  --   --    < > = values in this interval not displayed.   Thyroid  No results for input(s): TSH, FREET4 in the last 168 hours.  BNPNo results for input(s): BNP, PROBNP in the last 168 hours.  DDimer No results for input(s): DDIMER in the last 168 hours.   Radiology  CT HIP RIGHT WO CONTRAST Result Date: 10/27/2023 IMPRESSION: 1. No acute osseous abnormality. 2. Moderate osteoarthritis of the right hip. 3. Redemonstrated serpiginous sclerotic change along the anteromedial right femoral head, which could reflect avascular necrosis. No evidence of subchondral collapse. Electronically  Signed   By: Harrietta Sherry M.D.   On: 10/27/2023 18:22   DG Chest Portable 1 View Result Date: 10/27/2023 IMPRESSION: No active disease. Electronically Signed   By: Lynwood Landy Raddle M.D.   On: 10/27/2023 15:09   DG Pelvis Portable Result Date: 10/27/2023 IMPRESSION: Negative. Electronically Signed   By: Lynwood Landy Raddle M.D.   On: 10/27/2023 15:07    Cardiac Studies  10/03/2023 Echo complete 1. Left ventricular ejection fraction, by estimation, is 55 to 60%. The  left ventricle has normal function. The left ventricle has no regional  wall motion abnormalities. Left ventricular diastolic parameters are  indeterminate.   2. Right ventricular systolic function is normal. The right ventricular  size is normal. There is mildly elevated pulmonary artery systolic  pressure.   3. Left atrial size was mildly dilated.   4. Right atrial size was mildly dilated.   5. The mitral valve is normal in structure. Mild mitral valve  regurgitation. No evidence of mitral stenosis.   6. Tricuspid valve regurgitation is moderate.   7. The aortic valve is normal in structure. Aortic valve regurgitation is  not visualized. No aortic stenosis is present.   8. The inferior vena cava is dilated in size with >50% respiratory  variability, suggesting right atrial pressure of 8 mmHg.   Patient Profile   78 y.o. female with a history of recently diagnosed stage IV metastatic pancreatic cancer on chemotherapy, gout, orthostatic hypotension, COPD, hypertension, hyperlipidemia, and paroxysmal atrial fibrillation on Eliquis  who is being seen for the ongoing evaluation of narrow complex tachycardia.  Assessment & Plan   Atrial fibrillation/flutter - Known history of atrial fibrillation, on Eliquis  and low-dose metoprolol  at home in addition to as needed midodrine  for low BP - Echo 09/2023 showed normal LV systolic function with mildly elevated pulmonary artery systolic pressure - Presenting in 10/2 with shortness of  breath, weakness, and cough - Initial EKG showing atypical atrial flutter vs SVT with rate over 200 bpm - Patient was initially started on diltiazem drip with blood pressure dropping to 60s to 70s systolic.  This was discontinued and she was started on IV amiodarone  with improvement in rates and stabilization of blood pressure. - Arrhythmia likely driven by acute medical illness - Telemetry reviewed and shows wandering  atrial pacemaker vs normal sinus rhythm with frequent PACs, rate 60 to 100 bpm - Limited echo shows preserved ejection fraction with no evidence of pericardial effusion - Will transition from IV to oral amiodarone  load of 400 mg twice daily for 7 days, followed by 200 mg twice daily for 7 days, followed by 200 mg daily thereafter - Continue to hold metoprolol  as patient is currently requiring some pressure support with midodrine  - Will continue to hold anticoagulation in the setting of acute anemia.  This will need to be discussion at discharge if the benefits outweigh the risks.  Sepsis Possible pneumonia Acute hypoxic respiratory failure - Patient reports cough, thick sputum, fever/chills - Chest x-ray without signs of infection - No longer requiring supplemental oxygen - Ongoing management per IM  Acute on chronic anemia - Hemoglobin is low at 6.4, s/p 1 unit of PRBCs yesterday - Patient endorsing blood in stools - Ongoing evaluation/management per IM  Hypotension - BP overall stable - Hopefully as acute illness improves, patient will be able to wean off midodrine   Hyperkalemia - This was likely in the setting of oversupplementation, K now stable  Stage IV metastatic pancreatic cancer - Follows with oncology as outpatient  For questions or updates, please contact [facility] Please consult [email or web address] for contact info under       Signed, Lesley LITTIE Maffucci, PA-C  10/29/2023, 10:29 AM

## 2023-10-29 NOTE — Care Management Important Message (Signed)
 Important Message  Patient Details  Name: Colleen [name] MRN: [id number] Date of Birth: Dec 11, 1945   Important Message Given:  Yes - Medicare IM     [name] 10/29/2023, 1:43 PM

## 2023-10-29 NOTE — Progress Notes (Signed)
  Echocardiogram 2D Echocardiogram has been performed. Limited Echo requested and completed to reassess left ventricular function and evaluate for pericardial effusion.  Colleen [name] Louder 10/29/2023, 10:21 AM

## 2023-10-29 NOTE — Progress Notes (Addendum)
 [facility]  - [place] at [facility]   PATIENT NAME: Colleen [name]    MR#:  [id number]  DATE OF BIRTH:  Aug 14, 1945  SUBJECTIVE:  no family at bedside. Unable to leave voicemail for niece  patient came in after she had increasing weakness fall pain on the right hip and known to have elevated heartrate. Admitted with productive cough no fever. Chest x-ray negative. Appears frail and feeble  Patient is off amiodarone  drip. She is refusing to participate with PT. On room air. No respiratory distress.    VITALS:  Blood pressure 120/68, pulse 71, temperature 98.5 F (36.9 C), resp. rate 18, height 5' 4 (1.626 m), weight 74 kg, SpO2 100%.  PHYSICAL EXAMINATION:   GENERAL:  78 y.o.-year-old patient with no acute distress. Appears frail and feeble LUNGS: decreased breath sounds bilaterally, no wheezing CARDIOVASCULAR: S1, S2 normal. No murmur   ABDOMEN: Soft, nontender, nondistended. Bowel sounds present.  EXTREMITIES: No  edema b/l.    NEUROLOGIC: nonfocal  patient is alert and awake weak, deconditioned  LABORATORY PANEL:  CBC Recent Labs  Lab 10/28/23 0410 10/28/23 1121 10/28/23 1645  WBC 12.2*  --   --   HGB 6.8*   < > 7.8*  HCT 20.1*  --   --   PLT 125*  --   --    < > = values in this interval not displayed.    Chemistries  Recent Labs  Lab 10/27/23 1417 10/28/23 0410  NA 134* 138  K 5.2* 4.3  CL 106 111  CO2 18* 21*  GLUCOSE 131* 99  BUN 19 17  CREATININE 0.78 0.74  CALCIUM  8.5* 8.4*  AST 48*  --   ALT 15  --   ALKPHOS 106  --   BILITOT 1.8*  --    Cardiac Enzymes No results for input(s): TROPONINI in the last 168 hours. RADIOLOGY:  ECHOCARDIOGRAM LIMITED Result Date: 10/29/2023    ECHOCARDIOGRAM LIMITED REPORT   Patient Name:   Colleen [name] Date of Exam: 10/29/2023 Medical Rec #:  [id number]      Height:       64.0 in Accession #:    [id number]     Weight:       163.1 lb Date of Birth:  03/27/1945      BSA:          1.794 m Patient  Age:    78 years       BP:           125/55 mmHg Patient Gender: F              HR:           68 bpm. Exam Location:  [facility] Procedure: Limited Echo (Both Spectral and Color Flow Doppler were utilized            during procedure). Indications:     Atrial Flutter I48.92  History:         Patient has prior history of Echocardiogram examinations, most                  recent 10/03/2023.  Sonographer:     Thedora Louder RDCS, FASE Referring Phys:  [phone] CHRISTOPHER END Diagnosing Phys: Caron Poser  Sonographer Comments: This was a Limited Echo requested to reassess the left ventricular function and to evaluate for pericardial effusion. IMPRESSIONS  1. Limited study.  2. Left ventricular ejection fraction, by estimation, is 55 to 60%. The left ventricle  has normal function. Left ventricular diastolic function could not be evaluated.  3. Right ventricular systolic function is normal. The right ventricular size is normal.  4. Moderate pleural effusion. No pericardial effusion seen.  5. The inferior vena cava is normal in size with <50% respiratory variability, suggesting right atrial pressure of 8 mmHg. Comparison(s): A prior study was performed on 10/03/2023. No significant change from prior study. FINDINGS  Left Ventricle: Left ventricular ejection fraction, by estimation, is 55 to 60%. The left ventricle has normal function. The left ventricular internal cavity size was normal in size. There is no left ventricular hypertrophy. Left ventricular diastolic function could not be evaluated. Right Ventricle: The right ventricular size is normal. Right vetricular wall thickness was not well visualized. Right ventricular systolic function is normal. Left Atrium: Left atrial size was not assessed. Right Atrium: Right atrial size was not assessed. Pericardium: There is no evidence of pericardial effusion. Mitral Valve: The mitral valve is normal in structure. No evidence of mitral valve stenosis. Tricuspid Valve: The tricuspid  valve is not assessed. Aortic Valve: The aortic valve is tricuspid. No aortic stenosis is present. Pulmonic Valve: The pulmonic valve was not assessed. Aorta: Aortic root could not be assessed. Venous: The inferior vena cava is normal in size with less than 50% respiratory variability, suggesting right atrial pressure of 8 mmHg. IAS/Shunts: There is right bowing of the interatrial septum, suggestive of elevated left atrial pressure. The interatrial septum was not assessed. Additional Comments: There is a moderate pleural effusion.  LEFT VENTRICLE PLAX 2D LVIDd:         4.60 cm LVIDs:         3.20 cm LV PW:         1.00 cm LV IVS:        1.00 cm  LV Volumes (MOD) LV vol d, MOD A4C: 62.1 ml LV vol s, MOD A4C: 25.4 ml LV SV MOD A4C:     62.1 ml LEFT ATRIUM         Index LA diam:    3.70 cm 2.06 cm/m Caron Poser Electronically signed by Caron Poser Signature Date/Time: 10/29/2023/10:38:59 AM    Final    CT HIP RIGHT WO CONTRAST Result Date: 10/27/2023 CLINICAL DATA:  Hip trauma, fracture suspected, xray done EXAM: CT OF THE RIGHT HIP WITHOUT CONTRAST TECHNIQUE: Multidetector CT imaging of the right hip was performed according to the standard protocol. Multiplanar CT image reconstructions were also generated. RADIATION DOSE REDUCTION: This exam was performed according to the departmental dose-optimization program which includes automated exposure control, adjustment of the mA and/or kV according to patient size and/or use of iterative reconstruction technique. COMPARISON:  PET-CT dated 08/22/2023. FINDINGS: Bones/Joint/Cartilage No acute fracture or dislocation. The right femoral head is seated within the acetabulum. Moderate osteoarthritis of the right hip manifested by joint space narrowing and subchondral cystic changes of the superior acetabulum. Redemonstrated serpiginous sclerotic change along the anteromedial right femoral head, which could reflect avascular necrosis. No evidence of subchondral collapse.  11 mm well corticated ossicle adjacent to the right greater trochanter at the level of the intertrochanteric fossa. The visualized sacroiliac joints and pubic symphysis are intact with mild-to-moderate degenerative arthropathy. Ligaments Ligaments are suboptimally evaluated by CT. Muscles and Tendons No appreciable acute abnormality. Soft tissue No fluid collection or hematoma.  No soft tissue mass. Other: Fibroid uterus. IMPRESSION: 1. No acute osseous abnormality. 2. Moderate osteoarthritis of the right hip. 3. Redemonstrated serpiginous sclerotic change along the anteromedial  right femoral head, which could reflect avascular necrosis. No evidence of subchondral collapse. Electronically Signed   By: Harrietta Sherry M.D.   On: 10/27/2023 18:22   DG Chest Portable 1 View Result Date: 10/27/2023 CLINICAL DATA:  Shortness of breath. EXAM: PORTABLE CHEST 1 VIEW COMPARISON:  August 23, 2021. FINDINGS: Stable cardiomediastinal silhouette. Right internal jugular Port-A-Cath is noted. Lungs are clear. Degenerative changes are seen involving both shoulder joints. IMPRESSION: No active disease. Electronically Signed   By: Lynwood Landy Raddle M.D.   On: 10/27/2023 15:09   DG Pelvis Portable Result Date: 10/27/2023 CLINICAL DATA:  Fall. EXAM: PORTABLE PELVIS 1-2 VIEWS COMPARISON:  None Available. FINDINGS: There is no evidence of pelvic fracture or diastasis. No pelvic bone lesions are seen. IMPRESSION: Negative. Electronically Signed   By: Lynwood Landy Raddle M.D.   On: 10/27/2023 15:07    Assessment and Plan   SAHARA FUJIMOTO is a 78 y.o. female with medical history significant of recently diagnosed PAF, stage IV metastatic pancreatic cancer on chemotherapy, gout, orthostatic hypotension, GERD, sent by home care nurse for evaluation of hypotension and rapid A-fib.  Patient was found to have hypotension this morning by home care nurse, when her blood pressure reading was 70/40 and patient did complain about feeling of  lightheadedness. Heart rate was found to be in the 120s.  In the ER, Patient was found to be in rapid A-fib, Cardizem drip was started in the ED however blood pressure further dropped to 69/54, Cardizem drip stopped and patient was started on amiodarone  drip and blood pressure improved to 114/61.   Sepsis, without acute endorgan damage - Sepsis as evidenced by tachycardia, elevated lactic acid and elevated WBC count, source of infection wise, clinically suspect bronchitis alothough CXR neg and only procal is elevated 5.1 --sputum small colony of GPC - Clinically patient still appear to be volume contracted, reviewed patient history showed echo showed normal LVEF, but patient has severe hypoalbuminemia  --received IVF in ER --- Patient has no significant hypoxia -- patient on room air -- sepsis resolved   Hyperkalemia - Secondary to KCl supplement, discontinue KCl supplement   A-fib with RVR - Rate control with IV with amiodarone -- switch to oral-amiodarone  - Agreed with continue metoprolol  twice daily -given ongoing  anemia cardiology recommends to discontinue Eliquis  -- appreciate [facility] cardiology input   Stage IV metastatic pancreatic cancer Cachexia -Continue protein supplement continue megestrol  - Outpatient follow-up with oncology for chemotherapy   Chronic anemia - Most recent iron studies showed a picture compatible with anemia secondary to chronic illness/cancer. --hgb down to 6.8--pt agreeable for one unit BT-- 7.8   Worsening of right hip pain - X-ray negative for fracture or dislocation, her symptoms is disproportional to the image finding on x-ray -- right hip CT without contrast  shows moderate OA - PT evaluation   DVT prophylaxis: Eliquis  Code Status: Full code Family Communication: able to leave message for niece Clarissa Disposition Plan: Level of care: Progressive Status is: Inpatient Remains inpatient appropriate because: afib, bronchitis, fall, anemia     TOTAL TIME TAKING CARE OF THIS PATIENT:35 minutes.  >50% time spent on counselling and coordination of care  Note: This dictation was prepared with Dragon dictation along with smaller phrase technology. Any transcriptional errors that result from this process are unintentional.  Leita Blanch M.D    [facility]   CC: Primary care physician; Leavy Mole, PA-C

## 2023-10-30 DIAGNOSIS — R579 Shock, unspecified: Secondary | ICD-10-CM | POA: Diagnosis not present

## 2023-10-30 DIAGNOSIS — I4719 Other supraventricular tachycardia: Secondary | ICD-10-CM | POA: Diagnosis not present

## 2023-10-30 DIAGNOSIS — J40 Bronchitis, not specified as acute or chronic: Secondary | ICD-10-CM | POA: Diagnosis not present

## 2023-10-30 DIAGNOSIS — I4891 Unspecified atrial fibrillation: Secondary | ICD-10-CM | POA: Diagnosis not present

## 2023-10-30 DIAGNOSIS — I471 Supraventricular tachycardia, unspecified: Secondary | ICD-10-CM | POA: Diagnosis not present

## 2023-10-30 DIAGNOSIS — M25551 Pain in right hip: Secondary | ICD-10-CM | POA: Diagnosis not present

## 2023-10-30 DIAGNOSIS — D638 Anemia in other chronic diseases classified elsewhere: Secondary | ICD-10-CM | POA: Diagnosis not present

## 2023-10-30 LAB — CULTURE, RESPIRATORY W GRAM STAIN: Culture: NORMAL

## 2023-10-30 MED ORDER — ADULT MULTIVITAMIN W/MINERALS CH
ORAL_TABLET | Freq: Every day | ORAL | Status: DC
  Administered 2023-10-30 – 2023-11-04 (×6): 1 via ORAL
  Filled 2023-10-30 (×6): qty 1

## 2023-10-30 MED ORDER — VITAMIN D 25 MCG (1000 UNIT) PO TABS
1000.0000 [IU] | ORAL_TABLET | Freq: Every day | ORAL | Status: DC
  Administered 2023-10-30 – 2023-11-04 (×6): 1000 [IU] via ORAL
  Filled 2023-10-30 (×6): qty 1

## 2023-10-30 MED ORDER — CALCIUM CARBONATE 1250 (500 CA) MG PO TABS
1200.0000 mg | ORAL_TABLET | Freq: Every day | ORAL | Status: DC
  Administered 2023-10-31 – 2023-11-04 (×5): 1250 mg via ORAL
  Filled 2023-10-30 (×5): qty 1

## 2023-10-30 NOTE — Progress Notes (Signed)
 [facility]  - [place] at [facility]   PATIENT NAME: Colleen [name]    MR#:  [id number]  DATE OF BIRTH:  November 20, 1945  SUBJECTIVE:  no family at bedside.looks much better today. Agreeable to work with PT Spoke with niece Clarissa yday    VITALS:  Blood pressure (!) 108/53, pulse 87, temperature 99 F (37.2 C), resp. rate 18, height 5' 4 (1.626 m), weight 74 kg, SpO2 100%.  PHYSICAL EXAMINATION:   GENERAL:  78 y.o.-year-old patient with no acute distress. Appears frail and feeble LUNGS: decreased breath sounds bilaterally, no wheezing CARDIOVASCULAR: S1, S2 normal. No murmur   ABDOMEN: Soft, nontender, nondistended. Bowel sounds present.  EXTREMITIES: No  edema b/l.    NEUROLOGIC: nonfocal  patient is alert and awake weak, deconditioned  LABORATORY PANEL:  CBC Recent Labs  Lab 10/28/23 0410 10/28/23 1121 10/28/23 1645  WBC 12.2*  --   --   HGB 6.8*   < > 7.8*  HCT 20.1*  --   --   PLT 125*  --   --    < > = values in this interval not displayed.    Chemistries  Recent Labs  Lab 10/27/23 1417 10/28/23 0410  NA 134* 138  K 5.2* 4.3  CL 106 111  CO2 18* 21*  GLUCOSE 131* 99  BUN 19 17  CREATININE 0.78 0.74  CALCIUM  8.5* 8.4*  AST 48*  --   ALT 15  --   ALKPHOS 106  --   BILITOT 1.8*  --    Cardiac Enzymes No results for input(s): TROPONINI in the last 168 hours. RADIOLOGY:  ECHOCARDIOGRAM LIMITED Result Date: 10/29/2023    ECHOCARDIOGRAM LIMITED REPORT   Patient Name:   Colleen [name] Date of Exam: 10/29/2023 Medical Rec #:  [id number]      Height:       64.0 in Accession #:    [id number]     Weight:       163.1 lb Date of Birth:  July 20, 1945      BSA:          1.794 m Patient Age:    78 years       BP:           125/55 mmHg Patient Gender: F              HR:           68 bpm. Exam Location:  [facility] Procedure: Limited Echo (Both Spectral and Color Flow Doppler were utilized            during procedure). Indications:     Atrial Flutter I48.92   History:         Patient has prior history of Echocardiogram examinations, most                  recent 10/03/2023.  Sonographer:     Thedora Louder RDCS, FASE Referring Phys:  [phone] CHRISTOPHER END Diagnosing Phys: Caron Poser  Sonographer Comments: This was a Limited Echo requested to reassess the left ventricular function and to evaluate for pericardial effusion. IMPRESSIONS  1. Limited study.  2. Left ventricular ejection fraction, by estimation, is 55 to 60%. The left ventricle has normal function. Left ventricular diastolic function could not be evaluated.  3. Right ventricular systolic function is normal. The right ventricular size is normal.  4. Moderate pleural effusion. No pericardial effusion seen.  5. The inferior vena cava is normal in size with <  50% respiratory variability, suggesting right atrial pressure of 8 mmHg. Comparison(s): A prior study was performed on 10/03/2023. No significant change from prior study. FINDINGS  Left Ventricle: Left ventricular ejection fraction, by estimation, is 55 to 60%. The left ventricle has normal function. The left ventricular internal cavity size was normal in size. There is no left ventricular hypertrophy. Left ventricular diastolic function could not be evaluated. Right Ventricle: The right ventricular size is normal. Right vetricular wall thickness was not well visualized. Right ventricular systolic function is normal. Left Atrium: Left atrial size was not assessed. Right Atrium: Right atrial size was not assessed. Pericardium: There is no evidence of pericardial effusion. Mitral Valve: The mitral valve is normal in structure. No evidence of mitral valve stenosis. Tricuspid Valve: The tricuspid valve is not assessed. Aortic Valve: The aortic valve is tricuspid. No aortic stenosis is present. Pulmonic Valve: The pulmonic valve was not assessed. Aorta: Aortic root could not be assessed. Venous: The inferior vena cava is normal in size with less than 50% respiratory  variability, suggesting right atrial pressure of 8 mmHg. IAS/Shunts: There is right bowing of the interatrial septum, suggestive of elevated left atrial pressure. The interatrial septum was not assessed. Additional Comments: There is a moderate pleural effusion.  LEFT VENTRICLE PLAX 2D LVIDd:         4.60 cm LVIDs:         3.20 cm LV PW:         1.00 cm LV IVS:        1.00 cm  LV Volumes (MOD) LV vol d, MOD A4C: 62.1 ml LV vol s, MOD A4C: 25.4 ml LV SV MOD A4C:     62.1 ml LEFT ATRIUM         Index LA diam:    3.70 cm 2.06 cm/m Caron Poser Electronically signed by Caron Poser Signature Date/Time: 10/29/2023/10:38:59 AM    Final     Assessment and Plan   Colleen [name] is a 78 y.o. female with medical history significant of recently diagnosed PAF, stage IV metastatic pancreatic cancer on chemotherapy, gout, orthostatic hypotension, GERD, sent by home care nurse for evaluation of hypotension and rapid A-fib.  Patient was found to have hypotension this morning by home care nurse, when her blood pressure reading was 70/40 and patient did complain about feeling of lightheadedness. Heart rate was found to be in the 120s.  In the ER, Patient was found to be in rapid A-fib, Cardizem drip was started in the ED however blood pressure further dropped to 69/54, Cardizem drip stopped and patient was started on amiodarone  drip and blood pressure improved to 114/61.   Sepsis, without acute endorgan damage - Sepsis as evidenced by tachycardia, elevated lactic acid and elevated WBC count, source of infection wise, clinically suspect bronchitis alothough CXR neg and only procal is elevated 5.1 --sputum small colony of GPC --echo showed normal LVEF --- Patient has no significant hypoxia -- patient on room air -- sepsis resolved   Hyperkalemia - Secondary to KCl supplement, discontinue KCl supplement   A-fib with RVR - Rate control with IV with amiodarone -- switched to oral-amiodarone  -given ongoing  anemia  cardiology recommends to discontinue Eliquis  -- appreciate [facility] cardiology input --nno BB due to soft bp --cont midodrine    Stage IV metastatic pancreatic cancer Cachexia -Continue protein supplement continue megestrol  - Outpatient follow-up with oncology for chemotherapy   Chronic anemia - Most recent iron studies showed a picture compatible with anemia  secondary to chronic illness/cancer. --hgb down to 6.8--pt agreeable for one unit BT-- 7.8   Worsening of right hip pain - X-ray negative for fracture or dislocation, her symptoms is disproportional to the image finding on x-ray -- right hip CT without contrast  shows moderate OA - PT evaluation   DVT prophylaxis: Eliquis  Code Status: Full code Family Communication: spoke with  niece Clarissa on 10/4 Disposition Plan: Level of care: Progressive Status is: Inpatient Remains inpatient appropriate because: afib, bronchitis, fall, anemia    TOTAL TIME TAKING CARE OF THIS PATIENT:35 minutes.  >50% time spent on counselling and coordination of care  Note: This dictation was prepared with Dragon dictation along with smaller phrase technology. Any transcriptional errors that result from this process are unintentional.  Leita Blanch M.D    [facility]   CC: Primary care physician; Leavy Mole, PA-C

## 2023-10-30 NOTE — Plan of Care (Signed)
  Problem: Education: Goal: Knowledge of General Education information will improve Description: Including pain rating scale, medication(s)/side effects and non-pharmacologic comfort measures Outcome: Progressing   Problem: Clinical Measurements: Goal: Ability to maintain clinical measurements within normal limits will improve Outcome: Progressing   Problem: Clinical Measurements: Goal: Respiratory complications will improve Outcome: Progressing   Problem: Clinical Measurements: Goal: Cardiovascular complication will be avoided Outcome: Progressing   Problem: Activity: Goal: Risk for activity intolerance will decrease Outcome: Progressing   Problem: Elimination: Goal: Will not experience complications related to bowel motility Outcome: Progressing   Problem: Elimination: Goal: Will not experience complications related to urinary retention Outcome: Progressing   Problem: Pain Managment: Goal: General experience of comfort will improve and/or be controlled Outcome: Progressing   Problem: Safety: Goal: Ability to remain free from injury will improve Outcome: Progressing

## 2023-10-30 NOTE — Plan of Care (Signed)
  Problem: Clinical Measurements: Goal: Ability to maintain clinical measurements within normal limits will improve Outcome: Progressing   Problem: Nutrition: Goal: Adequate nutrition will be maintained Outcome: Progressing   Problem: Safety: Goal: Ability to remain free from injury will improve Outcome: Progressing   Problem: Skin Integrity: Goal: Risk for impaired skin integrity will decrease Outcome: Progressing

## 2023-10-30 NOTE — Evaluation (Signed)
 Physical Therapy Evaluation Patient Details Name: Colleen [name] MRN: [id number] DOB: [birth year] Today's Date: 10/30/2023  History of Present Illness  Colleen [name] is a 78 y.o. female with medical history significant of recently diagnosed stage IV metastatic pancreatic cancer on chemotherapy, gout, orthostatic hypotension, GERD recently here in September.  Returns with Afib.  Clinical Impression  Pt was able to move relatively well with bed mobility, needed assist to get up to standing.  Once she was up she did manage some slow and walker reliant gait to/from the bathroom but was fatigued with the effort and despite good vitals reports significant fatigue with the effort.  Pt with good overall effort but was much more limited that her baseline.  Pt will need continued PT to address functional limitations.       If plan is discharge home, recommend the following: A lot of help with walking and/or transfers;A little help with bathing/dressing/bathroom;Assistance with cooking/housework;Assist for transportation;Help with stairs or ramp for entrance   Can travel by private vehicle   Yes    Equipment Recommendations Other (comment)  Recommendations for Other Services       Functional Status Assessment Patient has had a recent decline in their functional status and demonstrates the ability to make significant improvements in function in a reasonable and predictable amount of time.     Precautions / Restrictions Precautions Precautions: Fall Recall of Precautions/Restrictions: Intact Restrictions Weight Bearing Restrictions Per Provider Order: No      Mobility  Bed Mobility Overal bed mobility: Needs Assistance Bed Mobility: Supine to Sit, Sit to Supine       Sit to supine: Contact guard assist   General bed mobility comments: no physical assist for bed mobility, inc time/effort    Transfers Overall transfer level: Needs assistance Equipment used: Rolling walker (2  wheels) Transfers: Sit to/from Stand, Bed to chair/wheelchair/BSC Sit to Stand: Min assist, From elevated surface           General transfer comment: good effort but unable to rise from standard height bed, elevated slightly with minA to help get to upright    Ambulation/Gait Ambulation/Gait assistance: Contact guard assist, Min assist Gait Distance (Feet): 40 Feet Assistive device: Rolling walker (2 wheels)         General Gait Details: quick to fatigue, self limited to in-room ambulation, no LOB, VC to maintain BOS within RW frame, slowed cadence and wide BOS  Stairs            Wheelchair Mobility     Tilt Bed    Modified Rankin (Stroke Patients Only)       Balance Overall balance assessment: Needs assistance Sitting-balance support: Feet supported Sitting balance-Leahy Scale: Good Sitting balance - Comments: Steady reaching within BOS   Standing balance support: Bilateral upper extremity supported, No upper extremity supported Standing balance-Leahy Scale: Fair Standing balance comment: UE use on RW in standing, able to intermittently use no UEs while donning pull up, needed PT assist                             Pertinent Vitals/Pain Pain Assessment Pain Assessment: No/denies pain    Home Living Family/patient expects to be discharged to:: Private residence Living Arrangements: Alone Available Help at Discharge: Family;Available PRN/intermittently Type of Home: House Home Access: Stairs to enter Entrance Stairs-Rails: Right Entrance Stairs-Number of Steps: 4   Home Layout: One level Home Equipment: Agricultural consultant (2 wheels);Cane -  single point;Tub bench      Prior Function Prior Level of Function : Independent/Modified Independent             Mobility Comments: uses SPC for mobility, reports she has been using walker more since last admission ADLs Comments: Pt is Ind in self care and IADLs. She no longer drives but niece drives  to appts and get groceries.     Extremity/Trunk Assessment   Upper Extremity Assessment Upper Extremity Assessment: Generalized weakness    Lower Extremity Assessment Lower Extremity Assessment: Generalized weakness       Communication   Communication Communication: No apparent difficulties    Cognition Arousal: Alert Behavior During Therapy: WFL for tasks assessed/performed   PT - Cognitive impairments: No apparent impairments                       PT - Cognition Comments: A&Ox4 Following commands: Intact       Cueing Cueing Techniques: Verbal cues     General Comments General comments (skin integrity, edema, etc.): Pt was able to reach down and do bulk of donning pull-up while seated on BSC, assist to pull up in back    Exercises     Assessment/Plan    PT Assessment Patient needs continued PT services  PT Problem List Decreased strength;Decreased activity tolerance;Decreased balance;Decreased mobility;Decreased safety awareness;Decreased knowledge of use of DME       PT Treatment Interventions DME instruction;Gait training;Functional mobility training;Therapeutic exercise;Balance training;Therapeutic activities;Neuromuscular re-education;Patient/family education    PT Goals (Current goals can be found in the Care Plan section)  Acute Rehab PT Goals Patient Stated Goal: I'm not signing anything that puts me in a nursing home more than 2 or 3 weeks PT Goal Formulation: With patient Time For Goal Achievement: 11/12/23 Potential to Achieve Goals: Good    Frequency Min 2X/week     Co-evaluation               AM-PAC PT 6 Clicks Mobility  Outcome Measure Help needed turning from your back to your side while in a flat bed without using bedrails?: A Little Help needed moving from lying on your back to sitting on the side of a flat bed without using bedrails?: A Little Help needed moving to and from a bed to a chair (including a wheelchair)?:  A Little Help needed standing up from a chair using your arms (e.g., wheelchair or bedside chair)?: A Lot Help needed to walk in hospital room?: A Little Help needed climbing 3-5 steps with a railing? : Total 6 Click Score: 15    End of Session Equipment Utilized During Treatment: Gait belt Activity Tolerance: Patient limited by fatigue Patient left: with chair alarm set;with call bell/phone within reach Nurse Communication: Mobility status PT Visit Diagnosis: Unsteadiness on feet (R26.81);Muscle weakness (generalized) (M62.81);Other abnormalities of gait and mobility (R26.89)    Time: 8884-8861 PT Time Calculation (min) (ACUTE ONLY): 23 min   Charges:   PT Evaluation $PT Eval Low Complexity: 1 Low PT Treatments $Gait Training: 8-22 mins PT General Charges $$ ACUTE PT VISIT: 1 Visit         [name], DPT 10/30/2023, 1:18 PM

## 2023-10-30 NOTE — Progress Notes (Signed)
 Rounding Note   Patient Name: LIZANN EDELMAN Date of Encounter: 10/30/2023  [facility] Cardiologist: Redell Cave, MD   Subjective Patient reports ongoing improvements.  She denies shortness of breath and is weaned off supplemental oxygen.  No chest pain.  Telemetry reviewed without further signs of arrhythmia.  Scheduled Meds:  allopurinol   200 mg Oral Daily   amiodarone   400 mg Oral BID   Followed by   NOREEN ON 11/01/2023] amiodarone   200 mg Oral BID   Followed by   NOREEN ON 11/05/2023] amiodarone   200 mg Oral Daily   budesonide -glycopyrrolate -formoterol   2 puff Inhalation BID   Chlorhexidine  Gluconate Cloth  6 each Topical Daily   doxycycline   100 mg Oral Q12H   famotidine   10 mg Oral QHS   guaiFENesin   600 mg Oral BID   megestrol   20 mg Oral BID   midodrine   5 mg Oral TID WC   simethicone   80 mg Oral BID   Continuous Infusions:   PRN Meds: acetaminophen  **OR** acetaminophen , ipratropium, magic mouthwash, ondansetron  **OR** ondansetron  (ZOFRAN ) IV   Vital Signs  Vitals:   10/29/23 2036 10/29/23 2310 10/30/23 0352 10/30/23 0853  BP: 100/60 (!) 102/50 (!) 102/49 (!) 108/53  Pulse: 78 82 86 87  Resp: 18 18 18    Temp: 98.3 F (36.8 C) 98.7 F (37.1 C) 98.4 F (36.9 C) 99 F (37.2 C)  TempSrc:      SpO2: 100% 100% 100% 100%  Weight:      Height:        Intake/Output Summary (Last 24 hours) at 10/30/2023 0943 Last data filed at 10/29/2023 1408 Gross per 24 hour  Intake 240 ml  Output --  Net 240 ml      10/27/2023    2:11 PM 10/25/2023    9:40 AM 10/19/2023    8:00 AM  Last 3 Weights  Weight (lbs) 163 lb 2.3 oz 162 lb 166 lb 3.2 oz  Weight (kg) 74 kg 73.483 kg 75.388 kg      Telemetry Wandering atrial pacemaker- Personally Reviewed  Physical Exam  General: Frail appearing, in no acute distress HEENT: normal Neck: no JVD Vascular: No carotid bruits; Distal pulses 2+ bilaterally Cardiac:  normal S1, S2; irregular rhythm, regular  rate; no murmur  Lungs: Lung sounds diminished throughout with overall poor inspiratory effort Abd: soft, nontender, no hepatomegaly  Ext: 1+ doughy edema Skin: warm and dry  Psych:  Normal affect   Labs High Sensitivity Troponin:   Recent Labs  Lab 10/01/23 1356 10/01/23 2122 10/27/23 1417 10/27/23 1626  TROPONINIHS 41* 35* 50* 44*     Chemistry Recent Labs  Lab 10/25/23 0918 10/27/23 1417 10/28/23 0410  NA 133* 134* 138  K 4.3 5.2* 4.3  CL 108 106 111  CO2 19* 18* 21*  GLUCOSE 112* 131* 99  BUN 17 19 17   CREATININE 0.85 0.78 0.74  CALCIUM  9.1 8.5* 8.4*  PROT 6.6 6.6  --   ALBUMIN 2.5* 2.2*  --   AST 31 48*  --   ALT 16 15  --   ALKPHOS 105 106  --   BILITOT 1.0 1.8*  --   GFRNONAA >60 >60 >60  ANIONGAP 6 10 6     Lipids No results for input(s): CHOL, TRIG, HDL, LABVLDL, LDLCALC, CHOLHDL in the last 168 hours.  Hematology Recent Labs  Lab 10/25/23 0918 10/27/23 1417 10/27/23 1417 10/28/23 0410 10/28/23 1121 10/28/23 1645  WBC 12.6* 22.0*  --  12.2*  --   --   RBC 2.86* 2.69*  --  2.18*  --   --   HGB 8.7* 8.3*   < > 6.8* 6.4* 7.8*  HCT 25.6* 25.0*  --  20.1*  --   --   MCV 89.5 92.9  --  92.2  --   --   MCH 30.4 30.9  --  31.2  --   --   MCHC 34.0 33.2  --  33.8  --   --   RDW 16.8* 17.1*  --  16.6*  --   --   PLT 190 181  --  125*  --   --    < > = values in this interval not displayed.   Thyroid  No results for input(s): TSH, FREET4 in the last 168 hours.  BNPNo results for input(s): BNP, PROBNP in the last 168 hours.  DDimer No results for input(s): DDIMER in the last 168 hours.   Radiology  CT HIP RIGHT WO CONTRAST Result Date: 10/27/2023 IMPRESSION: 1. No acute osseous abnormality. 2. Moderate osteoarthritis of the right hip. 3. Redemonstrated serpiginous sclerotic change along the anteromedial right femoral head, which could reflect avascular necrosis. No evidence of subchondral collapse. Electronically Signed   By:  Harrietta Sherry M.D.   On: 10/27/2023 18:22   DG Chest Portable 1 View Result Date: 10/27/2023 IMPRESSION: No active disease. Electronically Signed   By: Lynwood Landy Raddle M.D.   On: 10/27/2023 15:09   DG Pelvis Portable Result Date: 10/27/2023 IMPRESSION: Negative. Electronically Signed   By: Lynwood Landy Raddle M.D.   On: 10/27/2023 15:07    Cardiac Studies  10/03/2023 Echo complete 1. Left ventricular ejection fraction, by estimation, is 55 to 60%. The  left ventricle has normal function. The left ventricle has no regional  wall motion abnormalities. Left ventricular diastolic parameters are  indeterminate.   2. Right ventricular systolic function is normal. The right ventricular  size is normal. There is mildly elevated pulmonary artery systolic  pressure.   3. Left atrial size was mildly dilated.   4. Right atrial size was mildly dilated.   5. The mitral valve is normal in structure. Mild mitral valve  regurgitation. No evidence of mitral stenosis.   6. Tricuspid valve regurgitation is moderate.   7. The aortic valve is normal in structure. Aortic valve regurgitation is  not visualized. No aortic stenosis is present.   8. The inferior vena cava is dilated in size with >50% respiratory  variability, suggesting right atrial pressure of 8 mmHg.   Patient Profile   78 y.o. female with a history of recently diagnosed stage IV metastatic pancreatic cancer on chemotherapy, gout, orthostatic hypotension, COPD, hypertension, hyperlipidemia, and paroxysmal atrial fibrillation on Eliquis  who is being seen for the ongoing evaluation of narrow complex tachycardia.  Assessment & Plan   Atrial fibrillation/flutter - Known history of atrial fibrillation, on Eliquis  and low-dose metoprolol  at home in addition to as needed midodrine  for low BP - Echo 09/2023 showed normal LV systolic function with mildly elevated pulmonary artery systolic pressure - Presenting in 10/2 with shortness of breath,  weakness, and cough - Initial EKG showing atypical atrial flutter vs SVT with rate over 200 bpm - Patient was initially started on diltiazem drip with blood pressure dropping to 60s to 70s systolic.  This was discontinued and she was started on IV amiodarone  with improvement in rates and stabilization of blood pressure. - Arrhythmia likely driven by acute  medical illness - Telemetry reviewed and shows wandering atrial pacemaker. No further tachyarrhythmia.  - Limited echo shows preserved ejection fraction with no evidence of pericardial effusion - Continue oral amiodarone  load of 400 mg twice daily for 7 days, followed by 200 mg twice daily for 7 days, followed by 200 mg daily thereafter - Continue to hold metoprolol  as patient is currently requiring some pressure support with midodrine  - Would not recommend resuming anticoagulation in the setting of acute anemia requiring transfusion  Sepsis Possible pneumonia Acute hypoxic respiratory failure - Patient reports cough, thick sputum, fever/chills - Chest x-ray without signs of infection - No longer requiring supplemental oxygen - Ongoing management per IM  Acute on chronic anemia - S/p 1 unit of PRBCs 10/3 - Patient endorsing blood in stools - Ongoing evaluation/management per IM - Continue to hold DOAC  Hypotension - BP overall stable - Hopefully as acute illness improves, patient will be able to wean off midodrine   Hyperkalemia - This was likely in the setting of oversupplementation, K now stable  Stage IV metastatic pancreatic cancer - Follows with oncology as outpatient  For questions or updates, please contact [facility] Please consult [email or web address] for contact info under       Signed, Lesley LITTIE Maffucci, PA-C  10/30/2023, 9:43 AM

## 2023-10-30 NOTE — Plan of Care (Signed)

## 2023-10-31 ENCOUNTER — Telehealth: Payer: Self-pay | Admitting: *Deleted

## 2023-10-31 DIAGNOSIS — D638 Anemia in other chronic diseases classified elsewhere: Secondary | ICD-10-CM | POA: Diagnosis not present

## 2023-10-31 DIAGNOSIS — I4891 Unspecified atrial fibrillation: Secondary | ICD-10-CM | POA: Diagnosis not present

## 2023-10-31 DIAGNOSIS — M25551 Pain in right hip: Secondary | ICD-10-CM | POA: Diagnosis not present

## 2023-10-31 DIAGNOSIS — J40 Bronchitis, not specified as acute or chronic: Secondary | ICD-10-CM | POA: Diagnosis not present

## 2023-10-31 LAB — HEMOGLOBIN: Hemoglobin: 8 g/dL — ABNORMAL LOW (ref 12.0–15.0)

## 2023-10-31 LAB — MYCOPLASMA PNEUMONIAE ANTIBODY, IGM: Mycoplasma pneumo IgM: <770 U/mL (ref 0–769)

## 2023-10-31 MED ORDER — POLYETHYLENE GLYCOL 3350 17 G PO PACK
17.0000 g | PACK | Freq: Two times a day (BID) | ORAL | Status: DC
  Administered 2023-10-31 (×2): 17 g via ORAL
  Filled 2023-10-31 (×7): qty 1

## 2023-10-31 MED ORDER — DOCUSATE SODIUM 100 MG PO CAPS
100.0000 mg | ORAL_CAPSULE | Freq: Two times a day (BID) | ORAL | Status: DC
Start: 2023-10-31 — End: 2023-11-04
  Administered 2023-10-31 – 2023-11-04 (×5): 100 mg via ORAL
  Filled 2023-10-31 (×7): qty 1

## 2023-10-31 NOTE — Telephone Encounter (Signed)
 The niece for Colleen [name] sent a message late in the day stating that she is in the hospital right now do we want to give treatment and the person on our doctor on call is saying no not to get it in the hospital.  And then the niece also says that they are trying to get her to go into a nursing home to get 2 weeks of physical therapy.  They do not have it right now but they are all working on getting her to a place she only wants to be here in [place] and she wants to know if we get this PT in the rehab will the patient be able to come after the PT each time she needs the treatments.  I told her that Dr. Georgina again is not here today but he will be back tomorrow and I will let him have that information and see what we need to do but as of for tomorrow she will not get treatment.  She understands

## 2023-10-31 NOTE — Progress Notes (Signed)
 [facility]  - [place] at [facility]   PATIENT NAME: Colleen [name]    MR#:  [id number]  DATE OF BIRTH:  [birth year]  SUBJECTIVE:  no family at bedside.looks much better today. Per RN had hard BM with some blood Hgb 8.0    VITALS:  Blood pressure 124/67, pulse (!) 108, temperature 98.3 F (36.8 C), resp. rate 20, height 5' 4 (1.626 m), weight 74 kg, SpO2 98%.  PHYSICAL EXAMINATION:   GENERAL:  78 y.o.-year-old patient with no acute distress. Appears frail and feeble LUNGS: decreased breath sounds bilaterally, no wheezing CARDIOVASCULAR: S1, S2 normal. No murmur   ABDOMEN: Soft, nontender, nondistended EXTREMITIES: No  edema b/l.    NEUROLOGIC: nonfocal  patient is alert and awake weak, deconditioned  LABORATORY PANEL:  CBC Recent Labs  Lab 10/28/23 0410 10/28/23 1121 10/31/23 1200  WBC 12.2*  --   --   HGB 6.8*   < > 8.0*  HCT 20.1*  --   --   PLT 125*  --   --    < > = values in this interval not displayed.    Chemistries  Recent Labs  Lab 10/27/23 1417 10/28/23 0410  NA 134* 138  K 5.2* 4.3  CL 106 111  CO2 18* 21*  GLUCOSE 131* 99  BUN 19 17  CREATININE 0.78 0.74  CALCIUM  8.5* 8.4*  AST 48*  --   ALT 15  --   ALKPHOS 106  --   BILITOT 1.8*  --    Cardiac Enzymes No results for input(s): TROPONINI in the last 168 hours. RADIOLOGY:  No results found.   Assessment and Plan   Colleen [name] is a 78 y.o. female with medical history significant of recently diagnosed PAF, stage IV metastatic pancreatic cancer on chemotherapy, gout, orthostatic hypotension, GERD, sent by home care nurse for evaluation of hypotension and rapid A-fib.  Patient was found to have hypotension this morning by home care nurse, when her blood pressure reading was 70/40 and patient did complain about feeling of lightheadedness. Heart rate was found to be in the 120s.  In the ER, Patient was found to be in rapid A-fib, Cardizem drip was started in the ED however  blood pressure further dropped to 69/54, Cardizem drip stopped and patient was started on amiodarone  drip and blood pressure improved to 114/61.   Sepsis, without acute endorgan damage - Sepsis as evidenced by tachycardia, elevated lactic acid and elevated WBC count, source of infection wise, clinically suspect bronchitis alothough CXR neg and only procal is elevated 5.1 --sputum small colony of GPC --echo showed normal LVEF --- Patient has no significant hypoxia -- patient on room air -- sepsis resolved   Hyperkalemia - Secondary to KCl supplement, discontinue KCl supplement   A-fib with RVR - Rate control with IV with amiodarone -- switched to oral-amiodarone  -given ongoing  anemia cardiology recommends to discontinue Eliquis  -- appreciate [facility] cardiology input --nno BB due to soft bp --cont midodrine    Stage IV metastatic pancreatic cancer Cachexia -Continue protein supplement continue megestrol  - Outpatient follow-up with oncology for chemotherapy   Chronic anemia - Most recent iron studies showed a picture compatible with anemia secondary to chronic illness/cancer. --hgb down to 6.8--pt agreeable for one unit BT-- 7.8--8.0   Worsening of right hip pain - X-ray negative for fracture or dislocation, her symptoms is disproportional to the image finding on x-ray -- right hip CT without contrast  shows moderate OA --PT evaluation --rehab.  Pt and niece agreeable. TOC for d/c planning   DVT prophylaxis: Eliquis  Code Status: Full code Family Communication: spoke with  niece Clarissa on 10/4 Disposition Plan: Level of care: Progressive Status is: Inpatient Remains inpatient appropriate because: afib, bronchitis, fall, anemia    TOTAL TIME TAKING CARE OF THIS PATIENT:35 minutes.  >50% time spent on counselling and coordination of care  Note: This dictation was prepared with Dragon dictation along with smaller phrase technology. Any transcriptional errors that result from this  process are unintentional.  Leita Blanch M.D    [facility]   CC: Primary care physician; Leavy Mole, PA-C

## 2023-10-31 NOTE — Plan of Care (Signed)
  Problem: Education: Goal: Knowledge of General Education information will improve Description: Including pain rating scale, medication(s)/side effects and non-pharmacologic comfort measures Outcome: Progressing   Problem: Clinical Measurements: Goal: Will remain free from infection Outcome: Progressing   Problem: Clinical Measurements: Goal: Respiratory complications will improve Outcome: Progressing   Problem: Clinical Measurements: Goal: Cardiovascular complication will be avoided Outcome: Progressing   Problem: Elimination: Goal: Will not experience complications related to bowel motility Outcome: Progressing   Problem: Elimination: Goal: Will not experience complications related to urinary retention Outcome: Progressing   Problem: Pain Managment: Goal: General experience of comfort will improve and/or be controlled Outcome: Progressing   Problem: Safety: Goal: Ability to remain free from injury will improve Outcome: Progressing

## 2023-10-31 NOTE — TOC Progression Note (Signed)
 Transition of Care [facility]) - Progression Note    Patient Details  Name: Colleen [name] MRN: [id number] Date of Birth: 08-Dec-1945  [facility] CM/SW Contact  Dalia GORMAN Fuse, RN Phone Number: 10/31/2023, 3:27 PM  Clinical Narrative:     Therapy recs SNF for STR. TOC spoke with the patient's niece/POA Colleen [name]. She is in agreement with starting the SNF bed search in [place] Her first choice is [facility] by [facility]. She doesn't want [facility]. TOC to send FL2 out in [place]  Expected Discharge Plan: Home w Home Health Services Barriers to Discharge: Continued Medical Work up               Expected Discharge Plan and Services     Post Acute Care Choice: Resumption of Svcs/PTA Provider Living arrangements for the past 2 months: Single Family Home                           HH Arranged: RN, PT, Nurse's Aide, Social Work [facility] Agency: [facility] Date HH Agency Contacted: 10/28/23   Representative spoke with at [facility] Agency: Daphne   Social Drivers of Health (SDOH) Interventions SDOH Screenings   Food Insecurity: Food Insecurity Present (10/29/2023)  Housing: Low Risk  (10/29/2023)  Transportation Needs: No Transportation Needs (10/29/2023)  Recent Concern: Transportation Needs - Unmet Transportation Needs (10/01/2023)  Utilities: At Risk (10/29/2023)  Alcohol Screen: Low Risk  (12/02/2022)  Depression (PHQ2-9): Low Risk  (10/12/2023)  Financial Resource Strain: Low Risk  (12/02/2022)  Physical Activity: Insufficiently Active (12/02/2022)  Social Connections: Socially Isolated (10/29/2023)  Stress: No Stress Concern Present (12/02/2022)  Tobacco Use: Medium Risk (10/27/2023)  Health Literacy: Adequate Health Literacy (12/02/2022)    Readmission Risk Interventions    10/28/2023    4:03 PM 10/03/2023    3:20 PM  Readmission Risk Prevention Plan  Transportation Screening Complete Complete  Medication Review [employer]) Complete  Complete  PCP or Specialist appointment within 3-5 days of discharge Complete Complete  HRI or Home Care Consult Complete   SW Recovery Care/Counseling Consult Complete Complete  Palliative Care Screening Not Applicable Not Applicable  Skilled Nursing Facility Not Applicable Not Applicable

## 2023-10-31 NOTE — [place] FL2 (Signed)
 [place]  MEDICAID FL2 LEVEL OF CARE FORM     IDENTIFICATION  Patient Name: Colleen [name] Birthdate: 07/02/1945 Sex: female Admission Date (Current Location): 10/27/2023  [facility] and [place] Number:  Chiropodist and Address:  [facility], [address] [place], [place] [address]      Provider Number: [id number]  Attending Physician Name and Address:  Tobie Calix, MD  Relative Name and Phone Number:  Deward Landsberg (Niece)  [phone]    Current Level of Care: Hospital Recommended Level of Care: Skilled Nursing Facility Prior Approval Number:    Date Approved/Denied:   PASRR Number:    Discharge Plan: SNF    Current Diagnoses: Patient Active Problem List   Diagnosis Date Noted   Bronchitis 10/28/2023   Right hip pain 10/27/2023   Afib (HCC) 10/27/2023   Mucositis due to chemotherapy 10/05/2023   Odynophagia 10/03/2023   Pressure injury of skin 10/03/2023   Hypotension 10/02/2023   Dysphasia 10/01/2023   Dysphagia 10/01/2023   Primary pancreatic cancer with metastasis to other site (HCC) 09/07/2023   Protein-calorie malnutrition, moderate 09/01/2023   Hyponatremia 08/30/2023   Abnormal LFTs 08/04/2023   Chronic kidney disease, stage 3a (HCC) 08/04/2023   Obesity (BMI 30-39.9) 08/04/2023   Atrial fibrillation, chronic (HCC) 08/04/2023   Pulmonary embolism (HCC) 08/04/2023   Hypokalemia 08/04/2023   Pancreatic mass 08/04/2023   New onset type 2 diabetes mellitus (HCC) 04/04/2023   Acquired trigger finger of right middle finger 07/24/2021   Acquired trigger finger of right ring finger 07/24/2021   Trigger finger of right hand 06/26/2021   Hidradenitis suppurativa 11/12/2020   Status post gastroplasty-1980s 06/25/2020   CLE (columnar lined esophagus)    Hiatal hernia    Gastric nodule    Angiodysplasia of stomach and duodenum    Cecal polyp    Polyp of descending colon    Polyp of transverse colon    Atrial fibrillation  with RVR (HCC) 06/14/2019   Current use of anticoagulant therapy 06/14/2019   Iron deficiency anemia 06/14/2019   Essential (hemorrhagic) thrombocythemia (HCC) 06/14/2019   GERD (gastroesophageal reflux disease) 04/15/2019   Chronic low back pain 04/15/2019   Right pulmonary embolus 04/15/2019   DOE (dyspnea on exertion) 03/13/2019   Anemia of chronic disease 03/07/2019   Atherosclerosis of both carotid arteries 09/20/2018   Spinal stenosis of lumbar region with neurogenic claudication 09/20/2018   Aortic atherosclerosis 09/05/2018   At high risk for falls 09/05/2018   Osteopenia 05/18/2017   Stage 3 chronic kidney disease (HCC) 04/21/2017   Morbid obesity (HCC) 04/21/2017   Trigger middle finger of left hand 02/28/2017   Chronic gouty arthropathy without tophi 11/29/2016   Hypergammaglobulinemia 11/29/2016   Osteoarthritis of both knees 11/29/2016   Bilateral primary osteoarthritis of knee 11/29/2016   Allergic rhinitis, seasonal 07/30/2014   Gout 07/30/2014   HLD (hyperlipidemia) 07/30/2014   Osteoarthritis of knee 07/30/2014   Prediabetes 07/30/2014   Asthma, mild persistent 07/30/2014   COPD (chronic obstructive pulmonary disease) (HCC) 07/09/2008   Benign essential HTN 06/22/2006    Orientation RESPIRATION BLADDER Height & Weight     Situation, Place, Time, Self    Continent Weight: 74 kg Height:  5' 4 (162.6 cm)  BEHAVIORAL SYMPTOMS/MOOD NEUROLOGICAL BOWEL NUTRITION STATUS      Continent Diet (Regular)  AMBULATORY STATUS COMMUNICATION OF NEEDS Skin   Limited Assist Verbally Other (Comment) (Sacral Foam Prophylactic Dressing)  Personal Care Assistance Level of Assistance  Bathing, Feeding, Dressing Bathing Assistance: Limited assistance Feeding assistance: Limited assistance Dressing Assistance: Limited assistance     Functional Limitations Info             SPECIAL CARE FACTORS FREQUENCY  PT (By licensed PT), OT (By licensed OT)      PT Frequency: 5 x week OT Frequency: 5 x week            Contractures      Additional Factors Info  Code Status, Allergies Code Status Info: FULL Allergies Info: Fentanyl , NSAIDS, Oxycodone            Current Medications (10/31/2023):  This is the current hospital active medication list Current Facility-Administered Medications  Medication Dose Route Frequency Provider Last Rate Last Admin   acetaminophen  (TYLENOL ) tablet 650 mg  650 mg Oral Q6H PRN Laurita Cort DASEN, MD       Or   acetaminophen  (TYLENOL ) suppository 650 mg  650 mg Rectal Q6H PRN Laurita Cort DASEN, MD       allopurinol  (ZYLOPRIM ) tablet 200 mg  200 mg Oral Daily Laurita Cort T, MD   200 mg at 10/31/23 1001   amiodarone  (PACERONE ) tablet 400 mg  400 mg Oral BID Lorene Sinclair L, PA-C   400 mg at 10/31/23 1002   Followed by   NOREEN ON 11/01/2023] amiodarone  (PACERONE ) tablet 200 mg  200 mg Oral BID Lorene Sinclair L, PA-C       Followed by   NOREEN ON 11/05/2023] amiodarone  (PACERONE ) tablet 200 mg  200 mg Oral Daily Lorene, Mariah L, PA-C       budesonide -glycopyrrolate -formoterol  (BREZTRI ) 160-9-4.8 MCG/ACT inhaler 2 puff  2 puff Inhalation BID Laurita Cort T, MD   2 puff at 10/31/23 1009   calcium  carbonate (OS-CAL - dosed in mg of elemental calcium ) tablet 1,250 mg  1,250 mg Oral Q breakfast Patel, Sona, MD   1,250 mg at 10/31/23 1009   Chlorhexidine  Gluconate Cloth 2 % PADS 6 each  6 each Topical Daily Laurita Cort DASEN, MD   6 each at 10/30/23 9165   cholecalciferol (VITAMIN D3) 25 MCG (1000 UNIT) tablet 1,000 Units  1,000 Units Oral Daily Patel, Sona, MD   1,000 Units at 10/31/23 1001   docusate sodium (COLACE) capsule 100 mg  100 mg Oral BID Patel, Sona, MD   100 mg at 10/31/23 1005   doxycycline  (VIBRA -TABS) tablet 100 mg  100 mg Oral Q12H Patel, Sona, MD   100 mg at 10/31/23 1003   famotidine  (PEPCID ) tablet 10 mg  10 mg Oral QHS Laurita Cort T, MD   10 mg at 10/30/23 2135   guaiFENesin  (MUCINEX ) 12 hr tablet 600 mg   600 mg Oral BID Laurita Cort T, MD   600 mg at 10/31/23 1002   ipratropium (ATROVENT) nebulizer solution 0.5 mg  0.5 mg Nebulization Q4H PRN Laurita Cort T, MD       magic mouthwash  5 mL Oral QID PRN Niels Kayla FALCON, [facility]       megestrol  (MEGACE ) tablet 20 mg  20 mg Oral BID Zhang, Ping T, MD   20 mg at 10/31/23 1003   midodrine  (PROAMATINE ) tablet 5 mg  5 mg Oral TID WC Paduchowski, Kevin, MD   5 mg at 10/31/23 1318   multivitamin with minerals tablet   Oral Daily Patel, Sona, MD   1 tablet at 10/31/23 1002   ondansetron  (ZOFRAN ) tablet 4 mg  4  mg Oral Q6H PRN Laurita Manor T, MD       Or   ondansetron  (ZOFRAN ) injection 4 mg  4 mg Intravenous Q6H PRN Laurita Manor T, MD       polyethylene glycol (MIRALAX  / GLYCOLAX ) packet 17 g  17 g Oral BID Patel, Sona, MD   17 g at 10/31/23 1005   simethicone  (MYLICON) chewable tablet 80 mg  80 mg Oral BID Laurita Manor DASEN, MD   80 mg at 10/31/23 1003     Discharge Medications: Please see discharge summary for a list of discharge medications.  Relevant Imaging Results:  Relevant Lab Results:   Additional Information SSN    [id number]  Dalia GORMAN Fuse, RN

## 2023-10-31 NOTE — Progress Notes (Signed)
 Patient had medium sized BM this morning with bright red blood noted in the toilet.  Patient did say she was straining some while using the bathroom.  MD notified and stool softeners orders and recheck hemoglobin ordered.

## 2023-11-01 ENCOUNTER — Other Ambulatory Visit: Payer: Self-pay | Admitting: Oncology

## 2023-11-01 ENCOUNTER — Inpatient Hospital Stay: Admitting: Oncology

## 2023-11-01 ENCOUNTER — Inpatient Hospital Stay

## 2023-11-01 DIAGNOSIS — M25551 Pain in right hip: Secondary | ICD-10-CM | POA: Diagnosis not present

## 2023-11-01 DIAGNOSIS — I4891 Unspecified atrial fibrillation: Secondary | ICD-10-CM | POA: Diagnosis not present

## 2023-11-01 DIAGNOSIS — D638 Anemia in other chronic diseases classified elsewhere: Secondary | ICD-10-CM | POA: Diagnosis not present

## 2023-11-01 DIAGNOSIS — J40 Bronchitis, not specified as acute or chronic: Secondary | ICD-10-CM | POA: Diagnosis not present

## 2023-11-01 MED ORDER — DOCUSATE SODIUM 100 MG PO CAPS: 100.0000 mg | ORAL_CAPSULE | Freq: Two times a day (BID) | ORAL | 0 refills | Status: DC

## 2023-11-01 MED ORDER — AMIODARONE HCL 200 MG PO TABS: ORAL_TABLET | ORAL | 3 refills | Status: DC

## 2023-11-01 MED ORDER — MIDODRINE HCL 5 MG PO TABS: 5.0000 mg | ORAL_TABLET | Freq: Three times a day (TID) | ORAL | 0 refills | Status: DC

## 2023-11-01 MED ORDER — POLYETHYLENE GLYCOL 3350 17 G PO PACK: 17.0000 g | PACK | Freq: Two times a day (BID) | ORAL | 0 refills | Status: DC

## 2023-11-01 NOTE — TOC Progression Note (Signed)
 Transition of Care [facility]) - Progression Note    Patient Details  Name: Colleen [name] MRN: [id number] Date of Birth: 1945-10-24  Transition of Care [facility]) CM/SW Contact  Lauraine JAYSON Carpen, LCSW Phone Number: 11/01/2023, 10:50 AM  Clinical Narrative:   Reviewed bed offers with patient and niece. They have accepted [facility]. CSW called and started insurance authorization.  Expected Discharge Plan: Home w Home Health Services Barriers to Discharge: Continued Medical Work up               Expected Discharge Plan and Services     Post Acute Care Choice: Resumption of Svcs/PTA Provider Living arrangements for the past 2 months: Single Family Home Expected Discharge Date: 11/01/23                         HH Arranged: RN, PT, Nurse's Aide, Social Work [employer] Agency: [facility] Date HH Agency Contacted: 10/28/23   Representative spoke with at [facility] Agency: Daphne   Social Drivers of Health (SDOH) Interventions SDOH Screenings   Food Insecurity: Food Insecurity Present (10/29/2023)  Housing: Low Risk  (10/29/2023)  Transportation Needs: No Transportation Needs (10/29/2023)  Recent Concern: Transportation Needs - Unmet Transportation Needs (10/01/2023)  Utilities: At Risk (10/29/2023)  Alcohol Screen: Low Risk  (12/02/2022)  Depression (PHQ2-9): Low Risk  (10/12/2023)  Financial Resource Strain: Low Risk  (12/02/2022)  Physical Activity: Insufficiently Active (12/02/2022)  Social Connections: Socially Isolated (10/29/2023)  Stress: No Stress Concern Present (12/02/2022)  Tobacco Use: Medium Risk (10/27/2023)  Health Literacy: Adequate Health Literacy (12/02/2022)    Readmission Risk Interventions    10/28/2023    4:03 PM 10/03/2023    3:20 PM  Readmission Risk Prevention Plan  Transportation Screening Complete Complete  Medication Review Oceanographer) Complete Complete  PCP or Specialist appointment within 3-5 days of discharge Complete Complete  HRI or Home Care Consult  Complete   SW Recovery Care/Counseling Consult Complete Complete  Palliative Care Screening Not Applicable Not Applicable  Skilled Nursing Facility Not Applicable Not Applicable

## 2023-11-01 NOTE — Progress Notes (Addendum)
 [facility]  - [place] at [facility]   PATIENT NAME: Colleen [name]    MR#:  [id number]  DATE OF BIRTH:  [birth year]  SUBJECTIVE:  no family at bedside.looks much better today.  Tolerating PO diet well. She worked with physical therapy. Bowel movement with small amount of bright red blood noted by RN   VITALS:  Blood pressure 115/60, pulse 75, temperature 98.4 F (36.9 C), temperature source Oral, resp. rate 17, height 5' 4 (1.626 m), weight 74 kg, SpO2 99%.  PHYSICAL EXAMINATION:   GENERAL:  78 y.o.-year-old patient with no acute distress. Appears frail and feeble LUNGS: decreased breath sounds bilaterally, no wheezing CARDIOVASCULAR: S1, S2 normal. No murmur   ABDOMEN: Soft, nontender, nondistended EXTREMITIES: No  edema b/l.    NEUROLOGIC: nonfocal  patient is alert and awake weak, deconditioned  LABORATORY PANEL:  CBC Recent Labs  Lab 10/28/23 0410 10/28/23 1121 10/31/23 1200  WBC 12.2*  --   --   HGB 6.8*   < > 8.0*  HCT 20.1*  --   --   PLT 125*  --   --    < > = values in this interval not displayed.    Chemistries  Recent Labs  Lab 10/27/23 1417 10/28/23 0410  NA 134* 138  K 5.2* 4.3  CL 106 111  CO2 18* 21*  GLUCOSE 131* 99  BUN 19 17  CREATININE 0.78 0.74  CALCIUM  8.5* 8.4*  AST 48*  --   ALT 15  --   ALKPHOS 106  --   BILITOT 1.8*  --    Cardiac Enzymes No results for input(s): TROPONINI in the last 168 hours. RADIOLOGY:  No results found.   Assessment and Plan   Colleen [name] is a 78 y.o. female with medical history significant of recently diagnosed PAF, stage IV metastatic pancreatic cancer on chemotherapy, gout, orthostatic hypotension, GERD, sent by home care nurse for evaluation of hypotension and rapid A-fib.  Patient was found to have hypotension this morning by home care nurse, when her blood pressure reading was 70/40 and patient did complain about feeling of lightheadedness. Heart rate was found to be in the  120s.  In the ER, Patient was found to be in rapid A-fib, Cardizem drip was started in the ED however blood pressure further dropped to 69/54, Cardizem drip stopped and patient was started on amiodarone  drip and blood pressure improved to 114/61.   Sepsis, without acute endorgan damage - Sepsis as evidenced by tachycardia, elevated lactic acid and elevated WBC count, source of infection wise, clinically suspect bronchitis alothough CXR neg and only procal is elevated 5.1 --sputum small colony of GPC --echo showed normal LVEF --- Patient has no significant hypoxia -- patient on room air -- sepsis resolved --complete doxycycline  x 5 days   Hyperkalemia - Secondary to KCl supplement, discontinue KCl supplement   A-fib with RVR - Rate control with IV with amiodarone -- switched to oral-amiodarone -- patient will discharge on amiodarone  -given ongoing  anemia cardiology recommends to discontinue Eliquis  -- appreciate [facility] MG cardiology input --no BB due to soft bp --cont midodrine    Stage IV metastatic pancreatic cancer Cachexia -Continue protein supplement continue megestrol  --Outpatient follow-up with oncology Dr jacobo for chemotherapy   Chronic anemia --Most recent iron studies showed a picture compatible with anemia secondary to chronic illness/cancer. --hgb down to 6.8--pt agreeable for one unit BT-- 7.8--8.0   Worsening of right hip pain - X-ray negative for fracture or dislocation,  her symptoms is disproportional to the image finding on x-ray -- right hip CT without contrast  shows moderate OA --PT evaluation --rehab. Pt and niece agreeable. TOC for d/c planning   DVT prophylaxis: Eliquis  Code Status: Full code Family Communication: none today Disposition Plan: Level of care: Progressive Status is: Inpatient Remains inpatient appropriate because: afib, bronchitis, fall, anemia patient [facility] Awaiting insurance Auth. Discharge order placed.     TOTAL TIME TAKING CARE OF THIS PATIENT:35 minutes.  >50% time spent on counselling and coordination of care  Note: This dictation was prepared with Dragon dictation along with smaller phrase technology. Any transcriptional errors that result from this process are unintentional.  Colleen [name] M.D    [facility]   CC: Primary care physician; Colleen Mole, PA-C

## 2023-11-01 NOTE — Plan of Care (Signed)

## 2023-11-02 DIAGNOSIS — I4891 Unspecified atrial fibrillation: Secondary | ICD-10-CM | POA: Diagnosis not present

## 2023-11-02 NOTE — Progress Notes (Addendum)
 [facility]  - [place] at [facility]   PATIENT NAME: Colleen [name]    MR#:  [id number]  DATE OF BIRTH:  [birth year]  SUBJECTIVE:  Reports feeling fine, no pain, tolerating diet   VITALS:  Blood pressure (!) 124/57, pulse (!) 108, temperature 98.2 F (36.8 C), temperature source Oral, resp. rate 18, height 5' 4 (1.626 m), weight 74 kg, SpO2 92%.  PHYSICAL EXAMINATION:   GENERAL:  78 y.o.-year-old patient with no acute distress. Appears frail a  LUNGS: decreased breath sounds bilaterally, no wheezing CARDIOVASCULAR: S1, S2 normal. No murmur   ABDOMEN: Soft, nontender, nondistended EXTREMITIES: No  edema b/l.    NEUROLOGIC: nonfocal  patient is alert and awake weak, deconditioned  LABORATORY PANEL:  CBC Recent Labs  Lab 10/28/23 0410 10/28/23 1121 10/31/23 1200  WBC 12.2*  --   --   HGB 6.8*   < > 8.0*  HCT 20.1*  --   --   PLT 125*  --   --    < > = values in this interval not displayed.    Chemistries  Recent Labs  Lab 10/27/23 1417 10/28/23 0410  NA 134* 138  K 5.2* 4.3  CL 106 111  CO2 18* 21*  GLUCOSE 131* 99  BUN 19 17  CREATININE 0.78 0.74  CALCIUM  8.5* 8.4*  AST 48*  --   ALT 15  --   ALKPHOS 106  --   BILITOT 1.8*  --    Cardiac Enzymes No results for input(s): TROPONINI in the last 168 hours. RADIOLOGY:  No results found.   Assessment and Plan   Colleen [name] is a 78 y.o. female with medical history significant of recently diagnosed PAF, stage IV metastatic pancreatic cancer on chemotherapy, gout, orthostatic hypotension, GERD, sent by home care nurse for evaluation of hypotension and rapid A-fib.  Patient was found to have hypotension this morning by home care nurse, when her blood pressure reading was 70/40 and patient did complain about feeling of lightheadedness. Heart rate was found to be in the 120s.  In the ER, Patient was found to be in rapid A-fib, Cardizem drip was started in the ED however blood pressure further  dropped to 69/54, Cardizem drip stopped and patient was started on amiodarone  drip and blood pressure improved to 114/61.   SIRS, without acute endorgan damage - Sepsis as evidenced by tachycardia, elevated lactic acid and elevated WBC count, of infection wise, clinically suspect bronchitis alothough CXR neg and only procal is elevated 5.1 --sputum small colony of GPC --echo showed normal LVEF --- Patient has no significant hypoxia -- patient on room air -- sirs physiology resolved --complete doxycycline  x 5 days   Hyperkalemia - Secondary to KCl supplement, discontinue KCl supplement   A-fib with RVR - Rate control with IV with amiodarone -- switched to oral-amiodarone -- patient will discharge on amiodarone  -given ongoing  anemia cardiology recommends to discontinue Eliquis  -- appreciate [facility] MG cardiology input --no BB due to soft bp --d/c midodrine , BP has improved   Stage IV metastatic pancreatic cancer Cachexia -Continue protein supplement continue megestrol  --Outpatient follow-up with oncology Dr jacobo for chemotherapy   Chronic anemia --Most recent iron studies showed a picture compatible with anemia secondary to chronic illness/cancer. --hgb down to 6.8--pt agreeable for one unit BT-- 7.8--8.0   Worsening of right hip pain - X-ray negative for fracture or dislocation, her symptoms is disproportional to the image finding on x-ray -- right hip CT without contrast  shows moderate OA --PT evaluation --rehab. Pt and niece agreeable. TOC for d/c planning. P2P performed today   DVT prophylaxis: Eliquis  Code Status: Full code Family Communication: none today Disposition Plan: Level of care: Progressive Status is: Inpatient Remains inpatient appropriate because: pending snf determination   Devaughn KATHEE Ban M.D    [facility]   CC:

## 2023-11-02 NOTE — Progress Notes (Signed)
 Physical Therapy Treatment Patient Details Name: [name] MRN: [id number] DOB: 1945-12-17 Today's Date: 11/02/2023   History of Present Illness Colleen [name] is a 78 y.o. female with medical history significant of recently diagnosed stage IV metastatic pancreatic cancer on chemotherapy, gout, orthostatic hypotension, GERD recently here in September.  Returns with Afib.    PT Comments  Pt is making progress towards goals with ability to ambulate 2 x 40' with RW. 1 seated rest break required. Able to stand from low surface, with assistance required. Further tolerated seated there-ex for strengthening. Will continue to progress as able.     If plan is discharge home, recommend the following: A little help with walking and/or transfers;A little help with bathing/dressing/bathroom;Assist for transportation;Help with stairs or ramp for entrance   Can travel by private vehicle     Yes  Equipment Recommendations   (TBD)    Recommendations for Other Services       Precautions / Restrictions Precautions Precautions: Fall Recall of Precautions/Restrictions: Intact Restrictions Weight Bearing Restrictions Per Provider Order: No     Mobility  Bed Mobility Overal bed mobility: Needs Assistance Bed Mobility: Supine to Sit, Sit to Supine     Supine to sit: Contact guard Sit to supine: Min assist   General bed mobility comments: slight assist for returning B LEs back into bed and repositioning. Once seated at EOB, upright posture noted    Transfers Overall transfer level: Needs assistance Equipment used: Rolling walker (2 wheels) Transfers: Sit to/from Stand, Bed to chair/wheelchair/BSC Sit to Stand: Min assist, From elevated surface           General transfer comment: able to stand from low surface this date, however still required min assist and RW    Ambulation/Gait Ambulation/Gait assistance: Contact guard assist Gait Distance (Feet): 80 Feet Assistive device:  Rolling walker (2 wheels) Gait Pattern/deviations: Step-through pattern, Trunk flexed, Wide base of support       General Gait Details: ambulated in room 40' x 2 with RW with seated rest break inbetween bouts of gait training.   Stairs             Wheelchair Mobility     Tilt Bed    Modified Rankin (Stroke Patients Only)       Balance Overall balance assessment: Needs assistance Sitting-balance support: Feet supported Sitting balance-Leahy Scale: Good Sitting balance - Comments: Steady reaching within BOS   Standing balance support: Bilateral upper extremity supported, No upper extremity supported Standing balance-Leahy Scale: Fair                              Hotel manager: No apparent difficulties  Cognition Arousal: Alert Behavior During Therapy: WFL for tasks assessed/performed   PT - Cognitive impairments: No apparent impairments                       PT - Cognition Comments: pleasant and agreeable to session Following commands: Intact      Cueing Cueing Techniques: Verbal cues  Exercises Other Exercises Other Exercises: seated ther-ex performed including alt LAQ and alt marching. 1 x 10 reps with supervision. Cues for technique    General Comments        Pertinent Vitals/Pain Pain Assessment Pain Assessment: No/denies pain    Home Living  Prior Function            PT Goals (current goals can now be found in the care plan section) Acute Rehab PT Goals Patient Stated Goal: to go to SNF PT Goal Formulation: With patient Time For Goal Achievement: 11/12/23 Potential to Achieve Goals: Good Progress towards PT goals: Progressing toward goals    Frequency    Min 2X/week      PT Plan      Co-evaluation              AM-PAC PT 6 Clicks Mobility   Outcome Measure  Help needed turning from your back to your side while in a flat bed without  using bedrails?: A Little Help needed moving from lying on your back to sitting on the side of a flat bed without using bedrails?: A Little Help needed moving to and from a bed to a chair (including a wheelchair)?: A Little Help needed standing up from a chair using your arms (e.g., wheelchair or bedside chair)?: A Little Help needed to walk in hospital room?: A Little Help needed climbing 3-5 steps with a railing? : Total 6 Click Score: 16    End of Session Equipment Utilized During Treatment: Gait belt Activity Tolerance: Patient limited by fatigue Patient left: in bed;with bed alarm set Nurse Communication: Mobility status PT Visit Diagnosis: Unsteadiness on feet (R26.81);Muscle weakness (generalized) (M62.81);Other abnormalities of gait and mobility (R26.89)     Time: 1441-1501 PT Time Calculation (min) (ACUTE ONLY): 20 min  Charges:    $Gait Training: 8-22 mins PT General Charges $$ ACUTE PT VISIT: 1 Visit                     Corean Dade, PT, DPT, GCS [phone]    Ladan Vanderzanden 11/02/2023, 3:25 PM

## 2023-11-02 NOTE — Plan of Care (Signed)

## 2023-11-02 NOTE — TOC Progression Note (Addendum)
 Transition of Care [facility]) - Progression Note    Patient Details  Name: Colleen [name] MRN: [id number] Date of Birth: 12/25/1945  Transition of Care [facility]) CM/SW Contact  Lauraine JAYSON Carpen, LCSW Phone Number: 11/02/2023, 8:24 AM  Clinical Narrative:   SNF insurance authorization is still pending.  9:31 am: [employer] is offering a peer-to-peer review. Deadline is 5:00 pm. Details sent to MD in a secure chat.  10:47 am: Updated niece.  3:28 pm: Peer-to-peer review is complete. CSW called [facility] to let them know that today's PT note is in.  Expected Discharge Plan: Home w Home Health Services Barriers to Discharge: Continued Medical Work up               Expected Discharge Plan and Services     Post Acute Care Choice: Resumption of Svcs/PTA Provider Living arrangements for the past 2 months: Single Family Home Expected Discharge Date: 11/01/23                         HH Arranged: RN, PT, Nurse's Aide, Social Work [employer] Agency: [facility] Date HH Agency Contacted: 10/28/23   Representative spoke with at [facility] Agency: Daphne   Social Drivers of Health (SDOH) Interventions SDOH Screenings   Food Insecurity: Food Insecurity Present (10/29/2023)  Housing: Low Risk  (10/29/2023)  Transportation Needs: No Transportation Needs (10/29/2023)  Recent Concern: Transportation Needs - Unmet Transportation Needs (10/01/2023)  Utilities: At Risk (10/29/2023)  Alcohol Screen: Low Risk  (12/02/2022)  Depression (PHQ2-9): Low Risk  (10/12/2023)  Financial Resource Strain: Low Risk  (12/02/2022)  Physical Activity: Insufficiently Active (12/02/2022)  Social Connections: Socially Isolated (10/29/2023)  Stress: No Stress Concern Present (12/02/2022)  Tobacco Use: Medium Risk (10/27/2023)  Health Literacy: Adequate Health Literacy (12/02/2022)    Readmission Risk Interventions    10/28/2023    4:03 PM 10/03/2023    3:20 PM  Readmission Risk Prevention Plan  Transportation Screening  Complete Complete  Medication Review Oceanographer) Complete Complete  PCP or Specialist appointment within 3-5 days of discharge Complete Complete  HRI or Home Care Consult Complete   SW Recovery Care/Counseling Consult Complete Complete  Palliative Care Screening Not Applicable Not Applicable  Skilled Nursing Facility Not Applicable Not Applicable

## 2023-11-03 DIAGNOSIS — I4891 Unspecified atrial fibrillation: Secondary | ICD-10-CM | POA: Diagnosis not present

## 2023-11-03 LAB — CBC
HCT: 22.4 % — ABNORMAL LOW (ref 36.0–46.0)
Hemoglobin: 7.6 g/dL — ABNORMAL LOW (ref 12.0–15.0)
MCH: 30.4 pg (ref 26.0–34.0)
MCHC: 33.9 g/dL (ref 30.0–36.0)
MCV: 89.6 fL (ref 80.0–100.0)
Platelets: 113 K/uL — ABNORMAL LOW (ref 150–400)
RBC: 2.5 MIL/uL — ABNORMAL LOW (ref 3.87–5.11)
RDW: 16.7 % — ABNORMAL HIGH (ref 11.5–15.5)
WBC: 3.4 K/uL — ABNORMAL LOW (ref 4.0–10.5)
nRBC: 0.6 % — ABNORMAL HIGH (ref 0.0–0.2)

## 2023-11-03 LAB — MAGNESIUM: Magnesium: 1.6 mg/dL — ABNORMAL LOW (ref 1.7–2.4)

## 2023-11-03 LAB — BASIC METABOLIC PANEL WITH GFR
Anion gap: 2 — ABNORMAL LOW (ref 5–15)
BUN: 11 mg/dL (ref 8–23)
CO2: 24 mmol/L (ref 22–32)
Calcium: 7.9 mg/dL — ABNORMAL LOW (ref 8.9–10.3)
Chloride: 110 mmol/L (ref 98–111)
Creatinine, Ser: 0.78 mg/dL (ref 0.44–1.00)
GFR, Estimated: >60 mL/min (ref >60)
Glucose, Bld: 86 mg/dL (ref 70–99)
Potassium: 2.6 mmol/L — CL (ref 3.5–5.1)
Sodium: 136 mmol/L (ref 135–145)

## 2023-11-03 LAB — POTASSIUM: Potassium: 3 mmol/L — ABNORMAL LOW (ref 3.5–5.1)

## 2023-11-03 MED ORDER — POTASSIUM CHLORIDE 10 MEQ/100ML IV SOLN
10.0000 meq | INTRAVENOUS | Status: AC
  Administered 2023-11-03 (×2): 10 meq via INTRAVENOUS
  Filled 2023-11-03 (×2): qty 100

## 2023-11-03 MED ORDER — MAGNESIUM SULFATE 2 GM/50ML IV SOLN
2.0000 g | Freq: Once | INTRAVENOUS | Status: AC
  Administered 2023-11-03: 2 g via INTRAVENOUS
  Filled 2023-11-03: qty 50

## 2023-11-03 MED ORDER — POTASSIUM CHLORIDE 10 MEQ/100ML IV SOLN: 10.0000 meq | INTRAVENOUS | Status: DC

## 2023-11-03 MED ORDER — POTASSIUM CHLORIDE CRYS ER 20 MEQ PO TBCR
40.0000 meq | EXTENDED_RELEASE_TABLET | ORAL | Status: AC
  Administered 2023-11-03 (×2): 40 meq via ORAL
  Filled 2023-11-03 (×2): qty 2

## 2023-11-03 MED ORDER — POTASSIUM CHLORIDE CRYS ER 20 MEQ PO TBCR
40.0000 meq | EXTENDED_RELEASE_TABLET | Freq: Once | ORAL | Status: AC
  Administered 2023-11-03: 40 meq via ORAL
  Filled 2023-11-03: qty 2

## 2023-11-03 NOTE — TOC Progression Note (Signed)
 Transition of Care [facility]) - Progression Note    Patient Details  Name: Colleen [name] MRN: [id number] Date of Birth: Dec 15, 1945  Transition of Care [facility]) CM/SW Contact  Lauraine JAYSON Carpen, LCSW Phone Number: 11/03/2023, 4:15 PM  Clinical Narrative:  SNF auth approved: [id number]. Ambulance auth denied. Niece will transport. Will plan on discharge tomorrow if SNF has a bed.  Expected Discharge Plan: Home w Home Health Services Barriers to Discharge: Continued Medical Work up               Expected Discharge Plan and Services     Post Acute Care Choice: Resumption of Svcs/PTA Provider Living arrangements for the past 2 months: Single Family Home Expected Discharge Date: 11/01/23                         HH Arranged: RN, PT, Nurse's Aide, Social Work [employer] Agency: [facility] Date HH Agency Contacted: 10/28/23   Representative spoke with at [facility] Agency: Daphne   Social Drivers of Health (SDOH) Interventions SDOH Screenings   Food Insecurity: Food Insecurity Present (10/29/2023)  Housing: Low Risk  (10/29/2023)  Transportation Needs: No Transportation Needs (10/29/2023)  Recent Concern: Transportation Needs - Unmet Transportation Needs (10/01/2023)  Utilities: At Risk (10/29/2023)  Alcohol Screen: Low Risk  (12/02/2022)  Depression (PHQ2-9): Low Risk  (10/12/2023)  Financial Resource Strain: Low Risk  (12/02/2022)  Physical Activity: Insufficiently Active (12/02/2022)  Social Connections: Socially Isolated (10/29/2023)  Stress: No Stress Concern Present (12/02/2022)  Tobacco Use: Medium Risk (10/27/2023)  Health Literacy: Adequate Health Literacy (12/02/2022)    Readmission Risk Interventions    10/28/2023    4:03 PM 10/03/2023    3:20 PM  Readmission Risk Prevention Plan  Transportation Screening Complete Complete  Medication Review Oceanographer) Complete Complete  PCP or Specialist appointment within 3-5 days of discharge Complete Complete  HRI or Home Care Consult Complete    SW Recovery Care/Counseling Consult Complete Complete  Palliative Care Screening Not Applicable Not Applicable  Skilled Nursing Facility Not Applicable Not Applicable

## 2023-11-03 NOTE — Progress Notes (Signed)
 [facility]  - [place] [facility]   PATIENT NAME: Colleen [name]    MR#:  [id number]  DATE OF BIRTH:  [birth year]  SUBJECTIVE:  Reports feeling fine, no pain, tolerating diet, enjoying visit w/ sister   VITALS:  Blood pressure 118/74, pulse 90, temperature 97.7 F (36.5 C), temperature source Oral, resp. rate 20, height 5' 4 (1.626 m), weight 74 kg, SpO2 98%.  PHYSICAL EXAMINATION:   GENERAL:  78 y.o.-year-old patient with no acute distress. Appears frail a  LUNGS: decreased breath sounds bilaterally, no wheezing CARDIOVASCULAR: S1, S2 normal. No murmur   ABDOMEN: Soft, nontender, nondistended EXTREMITIES: No  edema b/l.    NEUROLOGIC: nonfocal  patient is alert and awake weak, deconditioned  LABORATORY PANEL:  CBC Recent Labs  Lab 11/03/23 0528  WBC 3.4*  HGB 7.6*  HCT 22.4*  PLT 113*    Chemistries  Recent Labs  Lab 11/03/23 0528 11/03/23 1402  NA 136  --   K 2.6* 3.0*  CL 110  --   CO2 24  --   GLUCOSE 86  --   BUN 11  --   CREATININE 0.78  --   CALCIUM  7.9*  --    Cardiac Enzymes No results for input(s): TROPONINI in the last 168 hours. RADIOLOGY:  No results found.   Assessment and Plan   Colleen [name] is a 78 y.o. female with medical history significant of recently diagnosed PAF, stage IV metastatic pancreatic cancer on chemotherapy, gout, orthostatic hypotension, GERD, sent by home care nurse for evaluation of hypotension and rapid A-fib.  Patient was found to have hypotension this morning by home care nurse, when her blood pressure reading was 70/40 and patient did complain about feeling of lightheadedness. Heart rate was found to be in the 120s.  In the ER, Patient was found to be in rapid A-fib, Cardizem drip was started in the ED however blood pressure further dropped to 69/54, Cardizem drip stopped and patient was started on amiodarone  drip and blood pressure improved to 114/61.   SIRS, without acute endorgan damage -  Sepsis as evidenced by tachycardia, elevated lactic acid and elevated WBC count, of infection wise, clinically suspect bronchitis alothough CXR neg and only procal is elevated 5.1 --sputum small colony of GPC --echo showed normal LVEF --- Patient has no significant hypoxia -- patient on room air -- sirs physiology resolved --completed doxycycline  x 5 days   hypokalemia - replete - f/u mg   A-fib with RVR - Rate control with IV with amiodarone -- switched to oral-amiodarone -- patient will discharge on amiodarone  -given ongoing  anemia cardiology recommends to discontinue Eliquis  -- appreciate [facility] cardiology input --no BB due to soft bp --d/c midodrine  on 10/8, BP has improved   Stage IV metastatic pancreatic cancer Cachexia -Continue protein supplement continue megestrol  --Outpatient follow-up with oncology Dr jacobo for chemotherapy   Chronic anemia --Most recent iron studies showed a picture compatible with anemia secondary to chronic illness/cancer. --hgb down to 6.8--pt agreeable for one unit BT-- hgb now stable in 7s   Worsening of right hip pain - X-ray negative for fracture or dislocation, her symptoms is disproportional to the image finding on x-ray -- right hip CT without contrast  shows moderate OA --PT evaluation --rehab. Pt and niece agreeable. TOC for d/c planning. P2P performed 10/9, rehab approved, now pending bed   DVT prophylaxis: Eliquis  Code Status: Full code Family Communication: sister at bedside 10/9 Disposition Plan: Level of care: Progressive  Status is: Inpatient Remains inpatient appropriate because: pending snf determination   Devaughn KATHEE Ban M.D    [facility]   CC:

## 2023-11-04 ENCOUNTER — Other Ambulatory Visit: Payer: Self-pay

## 2023-11-04 DIAGNOSIS — I4891 Unspecified atrial fibrillation: Secondary | ICD-10-CM | POA: Diagnosis not present

## 2023-11-04 LAB — CBC
HCT: 23.2 % — ABNORMAL LOW (ref 36.0–46.0)
Hemoglobin: 7.9 g/dL — ABNORMAL LOW (ref 12.0–15.0)
MCH: 31.1 pg (ref 26.0–34.0)
MCHC: 34.1 g/dL (ref 30.0–36.0)
MCV: 91.3 fL (ref 80.0–100.0)
Platelets: 174 K/uL (ref 150–400)
RBC: 2.54 MIL/uL — ABNORMAL LOW (ref 3.87–5.11)
RDW: 17.1 % — ABNORMAL HIGH (ref 11.5–15.5)
WBC: 3.4 K/uL — ABNORMAL LOW (ref 4.0–10.5)
nRBC: 0 % (ref 0.0–0.2)

## 2023-11-04 LAB — BASIC METABOLIC PANEL WITH GFR
Anion gap: 5 (ref 5–15)
BUN: 12 mg/dL (ref 8–23)
CO2: 23 mmol/L (ref 22–32)
Calcium: 8.2 mg/dL — ABNORMAL LOW (ref 8.9–10.3)
Chloride: 109 mmol/L (ref 98–111)
Creatinine, Ser: 0.8 mg/dL (ref 0.44–1.00)
GFR, Estimated: >60 mL/min (ref >60)
Glucose, Bld: 110 mg/dL — ABNORMAL HIGH (ref 70–99)
Potassium: 3.6 mmol/L (ref 3.5–5.1)
Sodium: 137 mmol/L (ref 135–145)

## 2023-11-04 MED ORDER — AMIODARONE HCL 200 MG PO TABS
200.0000 mg | ORAL_TABLET | Freq: Every day | ORAL | 1 refills | Status: DC
  Filled 2023-11-04: qty 60, 60d supply, fill #0

## 2023-11-04 MED ORDER — POTASSIUM CHLORIDE CRYS ER 20 MEQ PO TBCR: 20.0000 meq | EXTENDED_RELEASE_TABLET | Freq: Every day | ORAL | Status: DC

## 2023-11-04 MED ORDER — HEPARIN SOD (PORK) LOCK FLUSH 100 UNIT/ML IV SOLN
500.0000 [IU] | INTRAVENOUS | Status: AC | PRN
  Administered 2023-11-04: 500 [IU]

## 2023-11-04 NOTE — Discharge Instructions (Signed)
 Food Resources  Agency Name: [facility] Agency Address: [address], [place], [place] [address] Phone: [phone] Website: [email or web address] Service(s) Offered: Housing services, self-sufficiency, congregate meal program, weatherization program, Event organiser program, emergency food assistance,  housing counseling, home ownership program, wheels - to work program.  [employer] free for 60 and older at various locations from [employer], Monday-Friday:  [employer], [address] [place], [phone] -[facility], [address], [place] [phone]  -[facility], [address], [place] [phone]  -[address], [address] [place], [phone]  Agency Name: [facility] Address: [phone], [place], [place] [address] Phone: [phone] Website: [email or web address] Service(s) Offered: Home delivered hot, frozen, and emergency  meals. Grocery assistance program which matches  volunteers one-on-one with seniors unable to grocery shop  for themselves. Must be 60 years and older; less than 20  hours of in-home aide service, limited or no driving ability;  live alone or with someone with a disability; live in  Industry.  Agency Name: [employer]ologist [facility] [employer]) Address: [address] [place], [place] [address] Phone: [phone] Service(s) Offered: Food is served to shut-ins, homeless, elderly, and low income people in the community every Saturday (11:30 am-12:30 pm) and Sunday (12:30 pm-1:30pm). Volunteers also offer help and encouragement in seeking employment,  and spiritual guidance.  Agency Name: Department [employer] Social Services Address: [address] [place], [place] [address] Phone: [phone] Service(s) Offered: Child support services; child welfare services; food stamps; Medicaid; work first family assistance; and aid  with fuel,  rent, food and medicine.  Agency Name: Dietitian Address: [address] [place], [place] Phone: [phone] Website: [email or web address] Services Offered: Monday 10:00am-12:00, 8:00pm-9:00pm, and Friday 10:00am-12:00.  Agency Name: [employer] Address: [address] [place], [place] [address] Phone: [phone] Website: [email or web address] Service(s) Offered: Serves weekday meals, open from 11:30 am- 1:00 pm., and 6:30-7:30pm, Monday-Wednesday-Friday distributes food 3:30-6pm, Monday-Wednesday-Friday.  Agency Name: [facility] Address: [address] [place], [place] Phone: [phone] Website: [email or web address] Services Offered: Distributes food the 4th Saturday of the month, starting at 8:00 am  Agency Name: [facility] Address: [phone] [place], [place] [address] Phone: [phone] Website: [email or web address] Service(s) Offered: Bread of life, weekly food pantry. Open Wednesdays from 10:00am-noon.  Agency Name: [facility] [employer] Address: [address] [place], [place], [place] Phone: [phone] Services Offered: Distributes food 9am-1pm, Monday-Thursday. Call for details.  Agency Name: [facility] Address: [address], [place], [place] [address] Phone: [phone] Website: [email or web address] Service(s) Offered: Games developer. Call for assistance.  Agency Name: [facility] Address: [address], [place], [place] [address] Phone: [phone] Service Offered: Emergency Food Pantry. Call for appointment.  Agency Name: [facility] Address: [address], [place], [place] [address] Phone: [phone] Website: [email or web address] Services Offered: Games developer. Call for details  Agency Name: [facility] Address: [address] [place], [place] Phone:  [phone] Website: [email or web address] Service(s) Offered: Emergency Food Pantry. Call for details.  Agency Name: Holiday representative Address: [address] [place], [place] [address] Phone: [phone] or [phone] Website: [email or web address] Service(s) Offered: Distribute food 9am-11:30 am, Tuesday-Friday, and 1-3:30pm, Monday-Friday. Food pantry Monday-Friday 1pm-3pm, fresh items, Mon.-Wed.-Fri.  Agency Name: [facility] Address: [address] [place], [place] [address] Phone: [phone] Website: [email or web address] Services Offered: Distribute food Tues and Sats from 9:00am-noon.  Closed 1st Saturday of each month. Call for details  Agency Name: [employer] Address: [facility] [address] [place] [address] Phone: [phone]  Services Offered: Delivers meals every Thursday  Agency Name: [facility] Agency Address: [address], [place], [place] [address] Phone: [phone] Website: [email or web address] Service(s) Offered: Housing services, self-sufficiency, congregate meal program, and individual development account program.  Agency Name: [employer] Address: [address] [place], [place] [address] Phone: [phone] Email: [email or web address] Website: [email or web address] Service(s) Offered: Housing the homeless, feeding the hungry, Company secretary, job and education related services.  Agency Name: [facility] Address: [address], [place], [place] [address] Phone: [phone] Email: [email or web address] Service(s) Offered: Counseling, problem pregnancy, advocacy for Hispanics, limited emergency financial assistance.  Agency Name: Department of Social Services Address: [address] [place], [place] [address] Phone: [phone] Website: [email or web address] Service(s) Offered: Child  support services; child welfare services; SNAP; Medicaid; work first family assistance; and aid with fuel,  rent, food and medicine.  Agency Name: Holiday representative Address: [address] [place], [place] [address] Phone: [phone] or [phone] Email: [email or web address] Service(s) Offered: Family services and transient assistance; emergency food, fuel, clothing, limited furniture, utilities; budget counseling, general counseling; give a kid a coat; thrift store; Christmas food and toys. Utility assistance, food pantry, rental  assistance, life sustaining medicine  Do you feel isolated?  The Institute on Aging offers a [employer] that anyone can call toll free at [phone]. The friendship line is available 24 hours a day  [employer] is a Program of All-inclusive Care for the Elderly (PACE). Their mission is to promote and sustain the independence of seniors wishing to remain in the community. They provide seniors with comprehensive long-term health, social, medical and dietary care. Their program is a safe alternative to nursing home care. [phone]  [facility] Physical Address [facility] [address] [place], [place] [address] Phone: [phone]. . Online zoom yoga class, connect with others without leaving your home [facility] offers [place] dance cardio sessions for individuals via Zoom. This program provides: - Dance fitness activities Please contact program for more information. Servinganyone in need adults 18+ hiv/aids individuals families Call [phone]  Email [email or web address] to get more info  [employer] offers an online [employer] to individuals where they can receive help to focus on their best health. Whether you're a [employer] member or not, the neighborhood center offers a... Main Serviceshealth education  exercise & fitness  community support services  recreation  virtual  support Other Servicessupport groups Servinganyone in need adults young adults teens seniors individuals families [email or web address] to get more info  Schedule on their website  [facility] offers an array of activities for adults age 60 and over. This program provides:- Fitness and health programs- Tech classes- Activity books Main Serviceshealth education  community support services  exercise & fitness  recreation  more education Servingseniors  Call [phone]    For more resources go online to [email or web address].[email or web address].uk and type in you zipcode

## 2023-11-04 NOTE — Plan of Care (Signed)
  Problem: Education: Goal: Knowledge of General Education information will improve Description: Including pain rating scale, medication(s)/side effects and non-pharmacologic comfort measures Outcome: Progressing   Problem: Clinical Measurements: Goal: Ability to maintain clinical measurements within normal limits will improve Outcome: Progressing Goal: Will remain free from infection Outcome: Progressing   Problem: Elimination: Goal: Will not experience complications related to bowel motility Outcome: Progressing Goal: Will not experience complications related to urinary retention Outcome: Progressing   Problem: Pain Managment: Goal: General experience of comfort will improve and/or be controlled Outcome: Progressing

## 2023-11-04 NOTE — Discharge Summary (Signed)
 Colleen [name] FMW:[id number] DOB: August 30, 1945 DOA: 10/27/2023  PCP: Leavy Mole, PA-C  Admit date: 10/27/2023 Discharge date: 11/04/2023  Time spent: 35 minutes  Recommendations for Outpatient Follow-up:  Hematology f/u next week, review risks/benefits of resuming anticoagulation (apixaban  held per cardiology recs) Cardiology f/u 10/23 as scheduled Monitor potassium Pcp f/u as well     Discharge Diagnoses:  Principal Problem:   Afib (HCC) Active Problems:   Anemia of chronic disease   Atrial fibrillation with RVR (HCC)   Right hip pain   Bronchitis   Discharge Condition: stable  Diet recommendation: heart healthy  Filed Weights   10/27/23 1411  Weight: 74 kg    History of present illness:  From admission h and p Colleen [name] is a 78 y.o. female with medical history significant of recently diagnosed PAF, stage IV metastatic pancreatic cancer on chemotherapy, gout, orthostatic hypotension, GERD, sent by home care nurse for evaluation of hypotension and rapid A-fib.   Patient was found to have hypotension this morning by home care nurse, when her blood pressure reading was 70/40 and patient did complain about feeling of lightheadedness.  Heart rate was found to be in the 120s.  Meantime, patient also complaining about having had productive cough with whitish phlegm for last 2 days, more cough at night but denies any chest pain no fever or chills.  Patient denied any dysuria or urinary frequency, denied any diarrhea.  Furthermore, patient also complaining of worsening of right hip pain, she sustained a fall and hit her right hip recently and since then she has been getting worsening of hip pain and her ambulation has been reduced to roller walker, even so she still experiences severe right hip pain.  Hospital Course:   SIRS, without acute endorgan damage - as evidenced by tachycardia, elevated lactic acid and elevated WBC count,  clinically suspect bronchitis alothough CXR neg  and only procal is elevated 5.1 --sputum small colony of GPC --echo showed normal LVEF --- Patient has no significant hypoxia -- patient on room air -- sirs physiology resolved --completed doxycycline  x 5 days   hypokalemia Resolved, can resume home med but at lower dose, will need outpt monitoring  A-fib with RVR - Rate control with IV with amiodarone -- switched to oral-amiodarone -- patient will discharge on amiodarone  -given ongoing  anemia cardiology recommends to discontinue Eliquis  -- has cardiology f/u scheduled   Stage IV metastatic pancreatic cancer Cachexia -Continue protein supplement continue megestrol  --Outpatient follow-up with oncology Dr jacobo for chemotherapy   Chronic anemia --Most recent iron studies showed a picture compatible with anemia secondary to chronic illness/cancer. --hgb down to 6.8--pt agreeable for one unit BT-- hgb now stable in 7s   Worsening of right hip pain - X-ray negative for fracture or dislocation, her symptoms is disproportional to the image finding on x-ray -- right hip CT without contrast  shows moderate OA --PT evaluation --rehab. Pt and niece agreeable but now decline, instead opt for home health  Procedures: none   Consultations: cardiology  Discharge Exam: Vitals:   11/04/23 0558 11/04/23 0723  BP: 114/60 109/60  Pulse:  72  Resp: 16   Temp: 98.1 F (36.7 C) 98.1 F (36.7 C)  SpO2: 100% 100%    General: NAD Cardiovascular: RR Respiratory: basilar rales otherwise clear  Discharge Instructions   Discharge Instructions     Diet - low sodium heart healthy   Complete by: As directed    Increase activity slowly   Complete by: As  directed       Allergies as of 11/04/2023       Reactions   Fentanyl  Swelling   It messes with her heart.    Nsaids    Other Reaction(s): Not available   Oxycodone  Swelling        Medication List     PAUSE taking these medications    potassium chloride  SA 20 MEQ  tablet Wait to take this until your doctor or other care provider tells you to start again. Commonly known as: KLOR-CON  M Take 1 tablet (20 mEq total) by mouth daily. What changed: when to take this       STOP taking these medications    apixaban  2.5 MG Tabs tablet Commonly known as: Eliquis    metoprolol  tartrate 25 MG tablet Commonly known as: LOPRESSOR    midodrine  5 MG tablet Commonly known as: PROAMATINE        TAKE these medications    acetaminophen  650 MG CR tablet Commonly known as: TYLENOL  Take 650 mg by mouth every 8 (eight) hours as needed for pain.   Adult Blood Pressure Cuff Lg Kit 1 each by Does not apply route daily.   albuterol  108 (90 Base) MCG/ACT inhaler Commonly known as: VENTOLIN  HFA Inhale 2 puffs into the lungs every 4 (four) hours as needed for wheezing or shortness of breath.   allopurinol  100 MG tablet Commonly known as: ZYLOPRIM  Take 2 tablets (200 mg total) by mouth daily.   amiodarone  200 MG tablet Commonly known as: Pacerone  Take 1 tablet (200 mg total) by mouth daily.   Breztri  Aerosphere 160-9-4.8 MCG/ACT Aero inhaler Generic drug: budesonide -glycopyrrolate -formoterol  INHALE 2 PUFFS TWICE DAILY   Calcium  600 600 MG Tabs tablet Generic drug: calcium  carbonate Take 2 tablets (1,200 mg total) by mouth daily with breakfast.   cholecalciferol 25 MCG (1000 UNIT) tablet Commonly known as: VITAMIN D3 Take 1,000 Units by mouth daily.   docusate sodium 100 MG capsule Commonly known as: COLACE Take 1 capsule (100 mg total) by mouth 2 (two) times daily.   lidocaine -prilocaine  cream Commonly known as: EMLA  Apply to affected area once   magic mouthwash (lidocaine , diphenhydrAMINE, alum & mag hydroxide) suspension Swish and spit 5 mLs 4 (four) times daily as needed for mouth pain.   megestrol  20 MG tablet Commonly known as: MEGACE  Take 1 tablet (20 mg total) by mouth 2 (two) times daily.   MULTIVITAMIN WOMEN 50+ PO Take 1 tablet by  mouth daily.   ondansetron  8 MG tablet Commonly known as: Zofran  Take 1 tablet (8 mg total) by mouth every 8 (eight) hours as needed for nausea or vomiting.   PEPCID  PO Take 1 tablet by mouth at bedtime.   polyethylene glycol 17 g packet Commonly known as: MIRALAX  / GLYCOLAX  Take 17 g by mouth 2 (two) times daily.   simethicone  80 MG chewable tablet Commonly known as: Gas-X Chew 1 tablet (80 mg total) by mouth 2 (two) times a day.       Allergies  Allergen Reactions   Fentanyl  Swelling    It messes with her heart.    Nsaids     Other Reaction(s): Not available   Oxycodone  Swelling    Follow-up Information     [facility]) Follow up.   Specialty: Home Health Services Why: They will resume home health services at discharge. Contact information: [address] [place]  [address] [phone]        Leavy,  Leisa, PA-C Follow up.   Specialty: Family Medicine Contact information: [address] [place] [address] [phone]                  The results of significant diagnostics from this hospitalization (including imaging, microbiology, ancillary and laboratory) are listed below for reference.    Significant Diagnostic Studies: ECHOCARDIOGRAM LIMITED Result Date: 10/29/2023    ECHOCARDIOGRAM LIMITED REPORT   Patient Name:   NAZARENE BUNNING Date of Exam: 10/29/2023 Medical Rec #:  [id number]      Height:       64.0 in Accession #:    [id number]     Weight:       163.1 lb Date of Birth:  [birth year]      BSA:          1.794 m Patient Age:    78 years       BP:           125/55 mmHg Patient Gender: F              HR:           68 bpm. Exam Location:  [facility] Procedure: Limited Echo (Both Spectral and Color Flow Doppler were utilized            during procedure). Indications:     Atrial Flutter I48.92  History:         Patient has prior history of Echocardiogram examinations, most                   recent 10/03/2023.  Sonographer:     Thedora Louder RDCS, FASE Referring Phys:  [phone] CHRISTOPHER END Diagnosing Phys: Caron Poser  Sonographer Comments: This was a Limited Echo requested to reassess the left ventricular function and to evaluate for pericardial effusion. IMPRESSIONS  1. Limited study.  2. Left ventricular ejection fraction, by estimation, is 55 to 60%. The left ventricle has normal function. Left ventricular diastolic function could not be evaluated.  3. Right ventricular systolic function is normal. The right ventricular size is normal.  4. Moderate pleural effusion. No pericardial effusion seen.  5. The inferior vena cava is normal in size with <50% respiratory variability, suggesting right atrial pressure of 8 mmHg. Comparison(s): A prior study was performed on 10/03/2023. No significant change from prior study. FINDINGS  Left Ventricle: Left ventricular ejection fraction, by estimation, is 55 to 60%. The left ventricle has normal function. The left ventricular internal cavity size was normal in size. There is no left ventricular hypertrophy. Left ventricular diastolic function could not be evaluated. Right Ventricle: The right ventricular size is normal. Right vetricular wall thickness was not well visualized. Right ventricular systolic function is normal. Left Atrium: Left atrial size was not assessed. Right Atrium: Right atrial size was not assessed. Pericardium: There is no evidence of pericardial effusion. Mitral Valve: The mitral valve is normal in structure. No evidence of mitral valve stenosis. Tricuspid Valve: The tricuspid valve is not assessed. Aortic Valve: The aortic valve is tricuspid. No aortic stenosis is present. Pulmonic Valve: The pulmonic valve was not assessed. Aorta: Aortic root could not be assessed. Venous: The inferior vena cava is normal in size with less than 50% respiratory variability, suggesting right atrial pressure of 8 mmHg. IAS/Shunts: There is right bowing of  the interatrial septum, suggestive of elevated left atrial pressure. The interatrial septum was not assessed. Additional Comments: There is a moderate pleural effusion.  LEFT VENTRICLE  PLAX 2D LVIDd:         4.60 cm LVIDs:         3.20 cm LV PW:         1.00 cm LV IVS:        1.00 cm  LV Volumes (MOD) LV vol d, MOD A4C: 62.1 ml LV vol s, MOD A4C: 25.4 ml LV SV MOD A4C:     62.1 ml LEFT ATRIUM         Index LA diam:    3.70 cm 2.06 cm/m Caron Poser Electronically signed by Caron Poser Signature Date/Time: 10/29/2023/10:38:59 AM    Final    CT HIP RIGHT WO CONTRAST Result Date: 10/27/2023 CLINICAL DATA:  Hip trauma, fracture suspected, xray done EXAM: CT OF THE RIGHT HIP WITHOUT CONTRAST TECHNIQUE: Multidetector CT imaging of the right hip was performed according to the standard protocol. Multiplanar CT image reconstructions were also generated. RADIATION DOSE REDUCTION: This exam was performed according to the departmental dose-optimization program which includes automated exposure control, adjustment of the mA and/or kV according to patient size and/or use of iterative reconstruction technique. COMPARISON:  PET-CT dated 08/22/2023. FINDINGS: Bones/Joint/Cartilage No acute fracture or dislocation. The right femoral head is seated within the acetabulum. Moderate osteoarthritis of the right hip manifested by joint space narrowing and subchondral cystic changes of the superior acetabulum. Redemonstrated serpiginous sclerotic change along the anteromedial right femoral head, which could reflect avascular necrosis. No evidence of subchondral collapse. 11 mm well corticated ossicle adjacent to the right greater trochanter at the level of the intertrochanteric fossa. The visualized sacroiliac joints and pubic symphysis are intact with mild-to-moderate degenerative arthropathy. Ligaments Ligaments are suboptimally evaluated by CT. Muscles and Tendons No appreciable acute abnormality. Soft tissue No fluid collection  or hematoma.  No soft tissue mass. Other: Fibroid uterus. IMPRESSION: 1. No acute osseous abnormality. 2. Moderate osteoarthritis of the right hip. 3. Redemonstrated serpiginous sclerotic change along the anteromedial right femoral head, which could reflect avascular necrosis. No evidence of subchondral collapse. Electronically Signed   By: Harrietta Sherry M.D.   On: 10/27/2023 18:22   DG Chest Portable 1 View Result Date: 10/27/2023 CLINICAL DATA:  Shortness of breath. EXAM: PORTABLE CHEST 1 VIEW COMPARISON:  August 23, 2021. FINDINGS: Stable cardiomediastinal silhouette. Right internal jugular Port-A-Cath is noted. Lungs are clear. Degenerative changes are seen involving both shoulder joints. IMPRESSION: No active disease. Electronically Signed   By: Lynwood Landy Raddle M.D.   On: 10/27/2023 15:09   DG Pelvis Portable Result Date: 10/27/2023 CLINICAL DATA:  Fall. EXAM: PORTABLE PELVIS 1-2 VIEWS COMPARISON:  None Available. FINDINGS: There is no evidence of pelvic fracture or diastasis. No pelvic bone lesions are seen. IMPRESSION: Negative. Electronically Signed   By: Lynwood Landy Raddle M.D.   On: 10/27/2023 15:07    Microbiology: Recent Results (from the past 240 hours)  Expectorated Sputum Assessment w Gram Stain, Rflx to Resp Cult     Status: None   Collection Time: 10/27/23  6:48 PM   Specimen: Sputum  Result Value Ref Range Status   Specimen Description SPUTUM  Final   Special Requests NONE  Final   Sputum evaluation   Final    THIS SPECIMEN IS ACCEPTABLE FOR SPUTUM CULTURE Performed at [facility], [address], [place], [place] [address]    Report Status 10/27/2023 FINAL  Final  Respiratory (~20 pathogens) panel by PCR     Status: None   Collection Time: 10/27/23  6:48 PM   Specimen: Nasopharyngeal Swab; Respiratory  Result Value Ref Range Status   Adenovirus NOT DETECTED NOT DETECTED Final   Coronavirus 229E NOT DETECTED NOT DETECTED Final    Comment: (NOTE) The Coronavirus  on the Respiratory Panel, DOES NOT test for the novel  Coronavirus (2019 nCoV)    Coronavirus HKU1 NOT DETECTED NOT DETECTED Final   Coronavirus NL63 NOT DETECTED NOT DETECTED Final   Coronavirus OC43 NOT DETECTED NOT DETECTED Final   Metapneumovirus NOT DETECTED NOT DETECTED Final   Rhinovirus / Enterovirus NOT DETECTED NOT DETECTED Final   Influenza A NOT DETECTED NOT DETECTED Final   Influenza B NOT DETECTED NOT DETECTED Final   Parainfluenza Virus 1 NOT DETECTED NOT DETECTED Final   Parainfluenza Virus 2 NOT DETECTED NOT DETECTED Final   Parainfluenza Virus 3 NOT DETECTED NOT DETECTED Final   Parainfluenza Virus 4 NOT DETECTED NOT DETECTED Final   Respiratory Syncytial Virus NOT DETECTED NOT DETECTED Final   Bordetella pertussis NOT DETECTED NOT DETECTED Final   Bordetella Parapertussis NOT DETECTED NOT DETECTED Final   Chlamydophila pneumoniae NOT DETECTED NOT DETECTED Final   Mycoplasma pneumoniae NOT DETECTED NOT DETECTED Final    Comment: Performed at [facility], [address] [place], [place] [address]  Culture, Respiratory w Gram Stain     Status: None   Collection Time: 10/27/23  6:48 PM   Specimen: SPU  Result Value Ref Range Status   Specimen Description   Final    SPUTUM Performed at [facility], [address] [place], [place] [address]    Special Requests   Final    NONE Reflexed from [id number] Performed at [facility], [address] [place], [place] [address]    Gram Stain   Final    RARE WBC PRESENT, PREDOMINANTLY PMN MODERATE GRAM POSITIVE COCCI RARE GRAM NEGATIVE RODS    Culture   Final    Normal respiratory flora-no Staph aureus or Pseudomonas seen Performed at [facility], [address], [place], [place] [address]    Report Status 10/30/2023 FINAL  Final     Labs: Basic Metabolic Panel: Recent Labs  Lab 11/03/23 0528 11/03/23 1402 11/04/23 0555  NA 136  --  137  K 2.6* 3.0* 3.6  CL 110  --  109  CO2 24   --  23  GLUCOSE 86  --  110*  BUN 11  --  12  CREATININE 0.78  --  0.80  CALCIUM  7.9*  --  8.2*  MG  --  1.6*  --    Liver Function Tests: No results for input(s): AST, ALT, ALKPHOS, BILITOT, PROT, ALBUMIN in the last 168 hours. No results for input(s): LIPASE, AMYLASE in the last 168 hours. No results for input(s): AMMONIA in the last 168 hours. CBC: Recent Labs  Lab 10/28/23 1645 10/31/23 1200 11/03/23 0528 11/04/23 0555  WBC  --   --  3.4* 3.4*  HGB 7.8* 8.0* 7.6* 7.9*  HCT  --   --  22.4* 23.2*  MCV  --   --  89.6 91.3  PLT  --   --  113* 174   Cardiac Enzymes: No results for input(s): CKTOTAL, CKMB, CKMBINDEX, TROPONINI in the last 168 hours. BNP: BNP (last 3 results) No results for input(s): BNP in the last 8760 hours.  ProBNP (last 3 results) No results for input(s): PROBNP in the last 8760 hours.  CBG: No results for input(s): GLUCAP  in the last 168 hours.     Signed:  Devaughn KATHEE Ban MD.  [facility] 11/04/2023, 11:28 AM

## 2023-11-04 NOTE — Progress Notes (Signed)
 PT Cancellation Note  Patient Details Name: LINCOLN KLEINER MRN: [id number] DOB: [birth year]   Cancelled Treatment:    Reason Eval/Treat Not Completed: Patient declined, no reason specified;Other (comment)  Pt is getting ready for d/c.  Pt reports having no equipment needs and declined stair training reporting that her family would help her.  Continued PT will assist pt towards greater safety and independence and decrease burden of care.   Harland Irving, PTA  11/04/23, 12:09 PM

## 2023-11-04 NOTE — TOC Transition Note (Addendum)
 Transition of Care [facility]) - Discharge Note   Patient Details  Name: Colleen [name] MRN: [id number] Date of Birth: 1945-04-04  [facility] CM/SW Contact:  Lauraine JAYSON Carpen, LCSW Phone Number: 11/04/2023, 10:52 AM   Clinical Narrative:  Patient has orders to discharge home today. [facility] will not have a bed until at least Monday. CSW gave niece the option of going to [facility] until she can transfer to [facility] or go home and have the home health social worker work on placement from home. Patient and niece have decided to go home. Niece will go ahead and notify patient's PCP. [facility] social worker will coordinate with PCP. [facility] admissions coordinator is aware. CSW told niece the timeframe on how long this takes is unknown. Patient did not have a PASARR on her FL2. PASARR obtained: [phone] [id number]. Financial counselor will contact niece regarding Medicaid application so she can receive CAP services. No further concerns. Niece will transport her home. CSW signing off.  Final next level of care: Home w Home Health Services Barriers to Discharge: Barriers Resolved   Patient Goals and CMS Choice            Discharge Placement PASRR number recieved: 11/04/23              Patient to be transferred to facility by: Niece Name of family member notified: [name] Patient and family notified of of transfer: 11/04/23  Discharge Plan and Services Additional resources added to the After Visit Summary for       Post Acute Care Choice: Resumption of Svcs/PTA Provider                    HH Arranged: RN, PT, OT, Nurse's Aide, Social Work [facility] Agency: [facility] Date HH Agency Contacted: 11/04/23   Representative spoke with at [facility] Agency: [name]  Social Drivers of Health (SDOH) Interventions SDOH Screenings   Food Insecurity: Food Insecurity Present (10/29/2023)  Housing: Low Risk  (10/29/2023)  Transportation Needs: No Transportation  Needs (10/29/2023)  Recent Concern: Transportation Needs - Unmet Transportation Needs (10/01/2023)  Utilities: At Risk (10/29/2023)  Alcohol Screen: Low Risk  (12/02/2022)  Depression (PHQ2-9): Low Risk  (10/12/2023)  Financial Resource Strain: Low Risk  (12/02/2022)  Physical Activity: Insufficiently Active (12/02/2022)  Social Connections: Socially Isolated (10/29/2023)  Stress: No Stress Concern Present (12/02/2022)  Tobacco Use: Medium Risk (10/27/2023)  Health Literacy: Adequate Health Literacy (12/02/2022)     Readmission Risk Interventions    10/28/2023    4:03 PM 10/03/2023    3:20 PM  Readmission Risk Prevention Plan  Transportation Screening Complete Complete  Medication Review Oceanographer) Complete Complete  PCP or Specialist appointment within 3-5 days of discharge Complete Complete  HRI or Home Care Consult Complete   SW Recovery Care/Counseling Consult Complete Complete  Palliative Care Screening Not Applicable Not Applicable  Skilled Nursing Facility Not Applicable Not Applicable

## 2023-11-08 ENCOUNTER — Inpatient Hospital Stay (HOSPITAL_BASED_OUTPATIENT_CLINIC_OR_DEPARTMENT_OTHER): Admitting: Oncology

## 2023-11-08 ENCOUNTER — Inpatient Hospital Stay: Attending: Oncology

## 2023-11-08 ENCOUNTER — Inpatient Hospital Stay

## 2023-11-08 ENCOUNTER — Other Ambulatory Visit

## 2023-11-08 ENCOUNTER — Encounter: Payer: Self-pay | Admitting: Oncology

## 2023-11-08 ENCOUNTER — Ambulatory Visit: Admitting: Oncology

## 2023-11-08 ENCOUNTER — Ambulatory Visit

## 2023-11-08 VITALS — BP 108/60 | HR 72 | Wt 165.2 lb

## 2023-11-08 VITALS — BP 110/60 | HR 71 | Temp 96.0°F | Resp 18 | Ht 64.0 in

## 2023-11-08 DIAGNOSIS — E876 Hypokalemia: Secondary | ICD-10-CM

## 2023-11-08 DIAGNOSIS — C259 Malignant neoplasm of pancreas, unspecified: Secondary | ICD-10-CM

## 2023-11-08 DIAGNOSIS — N289 Disorder of kidney and ureter, unspecified: Secondary | ICD-10-CM | POA: Diagnosis not present

## 2023-11-08 DIAGNOSIS — D649 Anemia, unspecified: Secondary | ICD-10-CM | POA: Diagnosis not present

## 2023-11-08 DIAGNOSIS — D75839 Thrombocytosis, unspecified: Secondary | ICD-10-CM | POA: Insufficient documentation

## 2023-11-08 DIAGNOSIS — Z86711 Personal history of pulmonary embolism: Secondary | ICD-10-CM | POA: Insufficient documentation

## 2023-11-08 DIAGNOSIS — I4891 Unspecified atrial fibrillation: Secondary | ICD-10-CM | POA: Diagnosis not present

## 2023-11-08 DIAGNOSIS — E871 Hypo-osmolality and hyponatremia: Secondary | ICD-10-CM | POA: Insufficient documentation

## 2023-11-08 DIAGNOSIS — Z5111 Encounter for antineoplastic chemotherapy: Secondary | ICD-10-CM | POA: Insufficient documentation

## 2023-11-08 LAB — CBC WITH DIFFERENTIAL (CANCER CENTER ONLY)
Abs Immature Granulocytes: 0.05 K/uL (ref 0.00–0.07)
Basophils Absolute: 0 K/uL (ref 0.0–0.1)
Basophils Relative: 0 %
Eosinophils Absolute: 0 K/uL (ref 0.0–0.5)
Eosinophils Relative: 1 %
HCT: 26.9 % — ABNORMAL LOW (ref 36.0–46.0)
Hemoglobin: 9 g/dL — ABNORMAL LOW (ref 12.0–15.0)
Immature Granulocytes: 1 %
Lymphocytes Relative: 25 %
Lymphs Abs: 1.3 K/uL (ref 0.7–4.0)
MCH: 30 pg (ref 26.0–34.0)
MCHC: 33.5 g/dL (ref 30.0–36.0)
MCV: 89.7 fL (ref 80.0–100.0)
Monocytes Absolute: 1.1 K/uL — ABNORMAL HIGH (ref 0.1–1.0)
Monocytes Relative: 19 %
Neutro Abs: 3 K/uL (ref 1.7–7.7)
Neutrophils Relative %: 54 %
Platelet Count: 534 K/uL — ABNORMAL HIGH (ref 150–400)
RBC: 3 MIL/uL — ABNORMAL LOW (ref 3.87–5.11)
RDW: 17.9 % — ABNORMAL HIGH (ref 11.5–15.5)
WBC Count: 5.5 K/uL (ref 4.0–10.5)
nRBC: 0 % (ref 0.0–0.2)

## 2023-11-08 LAB — SAMPLE TO BLOOD BANK

## 2023-11-08 LAB — CMP (CANCER CENTER ONLY)
ALT: 14 U/L (ref 0–44)
AST: 27 U/L (ref 15–41)
Albumin: 2.5 g/dL — ABNORMAL LOW (ref 3.5–5.0)
Alkaline Phosphatase: 93 U/L (ref 38–126)
Anion gap: 9 (ref 5–15)
BUN: 16 mg/dL (ref 8–23)
CO2: 20 mmol/L — ABNORMAL LOW (ref 22–32)
Calcium: 8.3 mg/dL — ABNORMAL LOW (ref 8.9–10.3)
Chloride: 105 mmol/L (ref 98–111)
Creatinine: 1.07 mg/dL — ABNORMAL HIGH (ref 0.44–1.00)
GFR, Estimated: 53 mL/min — ABNORMAL LOW (ref >60)
Glucose, Bld: 85 mg/dL (ref 70–99)
Potassium: 2.8 mmol/L — ABNORMAL LOW (ref 3.5–5.1)
Sodium: 134 mmol/L — ABNORMAL LOW (ref 135–145)
Total Bilirubin: 0.9 mg/dL (ref 0.0–1.2)
Total Protein: 6 g/dL — ABNORMAL LOW (ref 6.5–8.1)

## 2023-11-08 MED ORDER — PROCHLORPERAZINE MALEATE 10 MG PO TABS
10.0000 mg | ORAL_TABLET | Freq: Once | ORAL | Status: AC
  Administered 2023-11-08: 10 mg via ORAL
  Filled 2023-11-08: qty 1

## 2023-11-08 MED ORDER — SODIUM CHLORIDE 0.9 % IV SOLN
INTRAVENOUS | Status: DC
  Filled 2023-11-08: qty 250

## 2023-11-08 MED ORDER — SODIUM CHLORIDE 0.9 % IV SOLN
800.0000 mg/m2 | Freq: Once | INTRAVENOUS | Status: AC
  Administered 2023-11-08: 1520 mg via INTRAVENOUS
  Filled 2023-11-08: qty 39.98

## 2023-11-08 MED ORDER — POTASSIUM CHLORIDE 20 MEQ/100ML IV SOLN
20.0000 meq | Freq: Once | INTRAVENOUS | Status: AC
  Administered 2023-11-08: 20 meq via INTRAVENOUS

## 2023-11-08 MED ORDER — PACLITAXEL PROTEIN-BOUND CHEMO INJECTION 100 MG
100.0000 mg/m2 | Freq: Once | INTRAVENOUS | Status: AC
  Administered 2023-11-08: 200 mg via INTRAVENOUS
  Filled 2023-11-08: qty 40

## 2023-11-08 NOTE — Progress Notes (Signed)
 [facility]  [phone] [phone]  ID: Colleen [name] OB: Mar 06, 1945  MR#: [id number]  CSN#:[id number]  Patient Care Team: Leavy Mole, PA-C as PCP - General (Family Medicine) Darliss Rogue, MD as PCP - Cardiology (Cardiology) Dave Agent, MD as Consulting Physician (Rheumatology) Ceola Tereasa PARAS, MD as Referring Physician (Oncology) Therisa Bi, MD as Consulting Physician (Gastroenterology) Maurie Rayfield BIRCH, RN as Oncology Nurse Navigator Jacobo, Evalene PARAS, MD as Consulting Physician (Oncology)  CHIEF COMPLAINT: Stage IV pancreatic adenocarcinoma.  INTERVAL HISTORY: Patient returns to clinic today for further evaluation and consideration of cycle 3, day 1 of dose reduced gemcitabine  and Abraxane .  She was recently admitted to the hospital for approximately a week with atrial fibrillation and rapid ventricular response.  She continues to have increased shortness of breath as well as peripheral edema, but otherwise feels well.  She has chronic weakness and fatigue.  She has no neurologic complaints.  Her appetite has improved and she no longer has dysphagia.  She has no chest pain, cough, or hemoptysis.  She has no abdominal pain.  She denies any nausea, vomiting, constipation, or diarrhea.  She has no urinary complaints.  Patient offers no further specific complaints today.  REVIEW OF SYSTEMS:   Review of Systems  Constitutional:  Positive for malaise/fatigue. Negative for fever and weight loss.  Respiratory:  Positive for shortness of breath. Negative for cough and hemoptysis.   Cardiovascular:  Positive for leg swelling. Negative for chest pain.  Gastrointestinal: Negative.  Negative for abdominal pain.  Genitourinary: Negative.  Negative for dysuria.  Musculoskeletal: Negative.  Negative for back pain.  Skin: Negative.  Negative for rash.  Neurological:  Positive for weakness. Negative for dizziness, focal weakness and headaches.   Psychiatric/Behavioral: Negative.  The patient is not nervous/anxious.     As per HPI. Otherwise, a complete review of systems is negative.  PAST MEDICAL HISTORY: Past Medical History:  Diagnosis Date   Asthma    Cataract    COPD (chronic obstructive pulmonary disease) (HCC)    GERD (gastroesophageal reflux disease)    Gout    History of pulmonary embolus (PE)    Hyperlipidemia    Hypertension    Osteoarthrosis    Prediabetes     PAST SURGICAL HISTORY: Past Surgical History:  Procedure Laterality Date   BACK SURGERY     BREAST BIOPSY Left 04/08/2014   intraductal papilloma 5 mm   COLONOSCOPY WITH PROPOFOL  N/A 04/23/2020   Procedure: COLONOSCOPY WITH PROPOFOL ;  Surgeon: Janalyn Keene NOVAK, MD;  Location: [facility] ENDOSCOPY;  Service: Endoscopy;  Laterality: N/A;   ERCP N/A 08/05/2023   Procedure: ERCP, WITH INTERVENTION IF INDICATED;  Surgeon: Jinny Carmine, MD;  Location: [facility] ENDOSCOPY;  Service: Endoscopy;  Laterality: N/A;   ESOPHAGOGASTRODUODENOSCOPY (EGD) WITH PROPOFOL  N/A 04/23/2020   Procedure: ESOPHAGOGASTRODUODENOSCOPY (EGD) WITH PROPOFOL ;  Surgeon: Janalyn Keene NOVAK, MD;  Location: [facility] ENDOSCOPY;  Service: Endoscopy;  Laterality: N/A;   GIVENS CAPSULE STUDY N/A 01/28/2022   Procedure: GIVENS CAPSULE STUDY;  Surgeon: Therisa Bi, MD;  Location: [facility] ENDOSCOPY;  Service: Gastroenterology;  Laterality: N/A;   IR IMAGING GUIDED PORT INSERTION  09/12/2023   IR INT EXT BILIARY DRAIN WITH CHOLANGIOGRAM  08/08/2023   IR RADIOLOGIST EVAL & MGMT  09/22/2023   IR REMOVAL BILIARY DRAIN  09/22/2023   LUMBAR LAMINECTOMY     TONSILLECTOMY      FAMILY HISTORY: Family History  Problem Relation Age of Onset   Diabetes Mother  Lung cancer Father    Diabetes Sister    Congestive Heart Failure Sister    Glaucoma Sister    Breast cancer Maternal Aunt     ADVANCED DIRECTIVES (Y/N):  N  HEALTH MAINTENANCE: Social History   Tobacco Use   Smoking status: Former    Current  packs/day: 0.00    Average packs/day: 1.5 packs/day for 20.0 years (30.0 ttl pk-yrs)    Types: Cigarettes    Start date: 11/13/1988    Quit date: 11/13/2008    Years since quitting: 14.9   Smokeless tobacco: Never   Tobacco comments:    smoking cessation materials not required  Vaping Use   Vaping status: Never Used  Substance Use Topics   Alcohol use: Not Currently   Drug use: No     Colonoscopy:  PAP:  Bone density:  Lipid panel:  Allergies  Allergen Reactions   Fentanyl  Swelling    It messes with her heart.    Nsaids     Other Reaction(s): Not available   Oxycodone  Swelling    Current Outpatient Medications  Medication Sig Dispense Refill   acetaminophen  (TYLENOL ) 650 MG CR tablet Take 650 mg by mouth every 8 (eight) hours as needed for pain.     albuterol  (VENTOLIN  HFA) 108 (90 Base) MCG/ACT inhaler Inhale 2 puffs into the lungs every 4 (four) hours as needed for wheezing or shortness of breath. 8 g 2   allopurinol  (ZYLOPRIM ) 100 MG tablet Take 2 tablets (200 mg total) by mouth daily. 180 tablet 1   amiodarone  (PACERONE ) 200 MG tablet Take 1 tablet (200 mg total) by mouth daily. 60 tablet 1   Blood Pressure Monitoring (ADULT BLOOD PRESSURE CUFF LG) KIT 1 each by Does not apply route daily. 1 kit 0   BREZTRI  AEROSPHERE 160-9-4.8 MCG/ACT AERO inhaler INHALE 2 PUFFS TWICE DAILY 33 g 0   calcium  carbonate (CALCIUM  600) 600 MG TABS tablet Take 2 tablets (1,200 mg total) by mouth daily with breakfast. 60 tablet 2   cholecalciferol (VITAMIN D3) 25 MCG (1000 UNIT) tablet Take 1,000 Units by mouth daily.     docusate sodium (COLACE) 100 MG capsule Take 1 capsule (100 mg total) by mouth 2 (two) times daily. 10 capsule 0   Famotidine  (PEPCID  PO) Take 1 tablet by mouth at bedtime.     lidocaine -prilocaine  (EMLA ) cream Apply to affected area once 30 g 3   magic mouthwash (lidocaine , diphenhydrAMINE, alum & mag hydroxide) suspension Swish and spit 5 mLs 4 (four) times daily as needed  for mouth pain. 360 mL 0   megestrol  (MEGACE ) 20 MG tablet Take 1 tablet (20 mg total) by mouth 2 (two) times daily. 60 tablet 0   Multiple Vitamins-Minerals (MULTIVITAMIN WOMEN 50+ PO) Take 1 tablet by mouth daily.     ondansetron  (ZOFRAN ) 8 MG tablet Take 1 tablet (8 mg total) by mouth every 8 (eight) hours as needed for nausea or vomiting. 60 tablet 2   polyethylene glycol (MIRALAX  / GLYCOLAX ) 17 g packet Take 17 g by mouth 2 (two) times daily. 14 each 0   simethicone  (GAS-X) 80 MG chewable tablet Chew 1 tablet (80 mg total) by mouth 2 (two) times a day. 60 tablet 2   [MAR Hold] potassium chloride  SA (KLOR-CON  M) 20 MEQ tablet Take 1 tablet (20 mEq total) by mouth daily. (Patient not taking: Reported on 11/08/2023)     No current facility-administered medications for this visit.   Facility-Administered Medications Ordered in Other  Visits  Medication Dose Route Frequency Provider Last Rate Last Admin   0.9 %  sodium chloride  infusion   Intravenous Continuous Zaidee Rion J, MD 10 mL/hr at 11/08/23 0935 New Bag at 11/08/23 0935   gemcitabine  (GEMZAR ) 1,520 mg in sodium chloride  0.9 % 250 mL chemo infusion  800 mg/m2 (Treatment Plan Recorded) Intravenous Once Xavien Dauphinais J, MD        OBJECTIVE: Vitals:   11/08/23 0833  BP: 110/60  Pulse: 71  Resp: 18  Temp: (!) 96 F (35.6 C)  SpO2: 99%     Body mass index is 28 kg/m.    ECOG FS:1 - Symptomatic but completely ambulatory  General: Well-developed, well-nourished, no acute distress.  Sitting in a wheelchair. Eyes: Pink conjunctiva, anicteric sclera. HEENT: Normocephalic, moist mucous membranes. Lungs: No audible wheezing or coughing. Heart: Regular rate and rhythm. Abdomen: Soft, nontender, no obvious distention. Musculoskeletal: 1-2+ lower extremity edema. Neuro: Alert, answering all questions appropriately. Cranial nerves grossly intact. Skin: No rashes or petechiae noted. Psych: Normal affect.   LAB RESULTS:  Lab  Results  Component Value Date   NA 134 (L) 11/08/2023   K 2.8 (L) 11/08/2023   CL 105 11/08/2023   CO2 20 (L) 11/08/2023   GLUCOSE 85 11/08/2023   BUN 16 11/08/2023   CREATININE 1.07 (H) 11/08/2023   CALCIUM  8.3 (L) 11/08/2023   PROT 6.0 (L) 11/08/2023   ALBUMIN 2.5 (L) 11/08/2023   AST 27 11/08/2023   ALT 14 11/08/2023   ALKPHOS 93 11/08/2023   BILITOT 0.9 11/08/2023   GFRNONAA 53 (L) 11/08/2023   GFRAA 58 (L) 02/28/2020    Lab Results  Component Value Date   WBC 5.5 11/08/2023   NEUTROABS 3.0 11/08/2023   HGB 9.0 (L) 11/08/2023   HCT 26.9 (L) 11/08/2023   MCV 89.7 11/08/2023   PLT 534 (H) 11/08/2023     STUDIES: ECHOCARDIOGRAM LIMITED Result Date: 10/29/2023    ECHOCARDIOGRAM LIMITED REPORT   Patient Name:   Colleen [name] Date of Exam: 10/29/2023 Medical Rec #:  [id number]      Height:       64.0 in Accession #:    [id number]     Weight:       163.1 lb Date of Birth:  1945-03-10      BSA:          1.794 m Patient Age:    78 years       BP:           125/55 mmHg Patient Gender: F              HR:           68 bpm. Exam Location:  [facility] Procedure: Limited Echo (Both Spectral and Color Flow Doppler were utilized            during procedure). Indications:     Atrial Flutter I48.92  History:         Patient has prior history of Echocardiogram examinations, most                  recent 10/03/2023.  Sonographer:     Thedora Louder RDCS, FASE Referring Phys:  [phone] CHRISTOPHER END Diagnosing Phys: Caron Poser  Sonographer Comments: This was a Limited Echo requested to reassess the left ventricular function and to evaluate for pericardial effusion. IMPRESSIONS  1. Limited study.  2. Left ventricular ejection fraction, by estimation, is 55 to 60%. The left ventricle  has normal function. Left ventricular diastolic function could not be evaluated.  3. Right ventricular systolic function is normal. The right ventricular size is normal.  4. Moderate pleural effusion. No pericardial effusion  seen.  5. The inferior vena cava is normal in size with <50% respiratory variability, suggesting right atrial pressure of 8 mmHg. Comparison(s): A prior study was performed on 10/03/2023. No significant change from prior study. FINDINGS  Left Ventricle: Left ventricular ejection fraction, by estimation, is 55 to 60%. The left ventricle has normal function. The left ventricular internal cavity size was normal in size. There is no left ventricular hypertrophy. Left ventricular diastolic function could not be evaluated. Right Ventricle: The right ventricular size is normal. Right vetricular wall thickness was not well visualized. Right ventricular systolic function is normal. Left Atrium: Left atrial size was not assessed. Right Atrium: Right atrial size was not assessed. Pericardium: There is no evidence of pericardial effusion. Mitral Valve: The mitral valve is normal in structure. No evidence of mitral valve stenosis. Tricuspid Valve: The tricuspid valve is not assessed. Aortic Valve: The aortic valve is tricuspid. No aortic stenosis is present. Pulmonic Valve: The pulmonic valve was not assessed. Aorta: Aortic root could not be assessed. Venous: The inferior vena cava is normal in size with less than 50% respiratory variability, suggesting right atrial pressure of 8 mmHg. IAS/Shunts: There is right bowing of the interatrial septum, suggestive of elevated left atrial pressure. The interatrial septum was not assessed. Additional Comments: There is a moderate pleural effusion.  LEFT VENTRICLE PLAX 2D LVIDd:         4.60 cm LVIDs:         3.20 cm LV PW:         1.00 cm LV IVS:        1.00 cm  LV Volumes (MOD) LV vol d, MOD A4C: 62.1 ml LV vol s, MOD A4C: 25.4 ml LV SV MOD A4C:     62.1 ml LEFT ATRIUM         Index LA diam:    3.70 cm 2.06 cm/m Caron Poser Electronically signed by Caron Poser Signature Date/Time: 10/29/2023/10:38:59 AM    Final    CT HIP RIGHT WO CONTRAST Result Date: 10/27/2023 CLINICAL DATA:   Hip trauma, fracture suspected, xray done EXAM: CT OF THE RIGHT HIP WITHOUT CONTRAST TECHNIQUE: Multidetector CT imaging of the right hip was performed according to the standard protocol. Multiplanar CT image reconstructions were also generated. RADIATION DOSE REDUCTION: This exam was performed according to the departmental dose-optimization program which includes automated exposure control, adjustment of the mA and/or kV according to patient size and/or use of iterative reconstruction technique. COMPARISON:  PET-CT dated 08/22/2023. FINDINGS: Bones/Joint/Cartilage No acute fracture or dislocation. The right femoral head is seated within the acetabulum. Moderate osteoarthritis of the right hip manifested by joint space narrowing and subchondral cystic changes of the superior acetabulum. Redemonstrated serpiginous sclerotic change along the anteromedial right femoral head, which could reflect avascular necrosis. No evidence of subchondral collapse. 11 mm well corticated ossicle adjacent to the right greater trochanter at the level of the intertrochanteric fossa. The visualized sacroiliac joints and pubic symphysis are intact with mild-to-moderate degenerative arthropathy. Ligaments Ligaments are suboptimally evaluated by CT. Muscles and Tendons No appreciable acute abnormality. Soft tissue No fluid collection or hematoma.  No soft tissue mass. Other: Fibroid uterus. IMPRESSION: 1. No acute osseous abnormality. 2. Moderate osteoarthritis of the right hip. 3. Redemonstrated serpiginous sclerotic change along the anteromedial  right femoral head, which could reflect avascular necrosis. No evidence of subchondral collapse. Electronically Signed   By: Harrietta Sherry M.D.   On: 10/27/2023 18:22   DG Chest Portable 1 View Result Date: 10/27/2023 CLINICAL DATA:  Shortness of breath. EXAM: PORTABLE CHEST 1 VIEW COMPARISON:  August 23, 2021. FINDINGS: Stable cardiomediastinal silhouette. Right internal jugular Port-A-Cath is  noted. Lungs are clear. Degenerative changes are seen involving both shoulder joints. IMPRESSION: No active disease. Electronically Signed   By: Lynwood Landy Raddle M.D.   On: 10/27/2023 15:09   DG Pelvis Portable Result Date: 10/27/2023 CLINICAL DATA:  Fall. EXAM: PORTABLE PELVIS 1-2 VIEWS COMPARISON:  None Available. FINDINGS: There is no evidence of pelvic fracture or diastasis. No pelvic bone lesions are seen. IMPRESSION: Negative. Electronically Signed   By: Lynwood Landy Raddle M.D.   On: 10/27/2023 15:07    ASSESSMENT: Stage IV pancreatic adenocarcinoma.  PLAN:    Stage IV pancreatic adenocarcinoma: EUS at [facility] confirmed diagnosis.  PET scan results from August 22, 2023 reviewed independently with 3 hypermetabolic lesions in patient's liver consistent with metastatic disease.  Patient's CA 19-9 has decreased from 6967 to 662.  Today's result is pending.  Both Abraxane  and gemcitabine  have been dose reduced.  Patient has not received day 15 of treatment and either cycle 1 or cycle 2, therefore will likely eliminate this moving forward.  Proceed with cycle 3, day 1 of treatment today.  Return to clinic in 1 week for further evaluation and consideration of cycle 3, day 8.  Hyperbilirubinemia: Resolved.  External drain has been removed.   Hyponatremia: Chronic and unchanged.  Patient sodium levels 134.   Hypokalemia: Patient's potassium levels trended down to 2.8.  She received 20 mEq IV potassium today and has been instructed to reinitiate her oral potassium supplementation.   Renal insufficiency: Mild, monitor. Leukocytosis: Patient's total white blood cell count is within normal limits today.   Anemia: Chronic and unchanged.  Patient's hemoglobin is 9.0. Thrombocytosis: Likely reactive, monitor. Poor appetite: Continue Megace  as prescribed. History of PE: Patient had PE diagnosed in 2021.  Eliquis  was recently discontinued. Atrial fibrillation/shortness of breath/peripheral edema: Continue monitoring  and treatment per cardiology.  Patient reports she has an appointment in the next 1 to 2 weeks.  Patient expressed understanding and was in agreement with this plan. She also understands that She can call clinic at any time with any questions, concerns, or complaints.    Cancer Staging  Primary pancreatic cancer with metastasis to other site [facility]) Staging form: Exocrine Pancreas, AJCC 8th Edition - Clinical stage from 09/07/2023: Stage IV (cT2, cN0, cM1) - Signed by Jacobo Evalene PARAS, MD on 09/07/2023 Stage prefix: Initial diagnosis Total positive nodes: 0   Evalene PARAS Jacobo, MD   11/08/2023 12:15 PM

## 2023-11-08 NOTE — Patient Instructions (Signed)

## 2023-11-09 ENCOUNTER — Other Ambulatory Visit: Payer: Self-pay

## 2023-11-09 ENCOUNTER — Emergency Department

## 2023-11-09 ENCOUNTER — Inpatient Hospital Stay: Admission: EM | Admit: 2023-11-09 | Discharge: 2023-12-01 | DRG: 309 | Disposition: A | Discharge status: Home Health

## 2023-11-09 DIAGNOSIS — R7303 Prediabetes: Secondary | ICD-10-CM | POA: Diagnosis present

## 2023-11-09 DIAGNOSIS — R531 Weakness: Principal | ICD-10-CM

## 2023-11-09 DIAGNOSIS — C259 Malignant neoplasm of pancreas, unspecified: Secondary | ICD-10-CM | POA: Diagnosis present

## 2023-11-09 DIAGNOSIS — I959 Hypotension, unspecified: Secondary | ICD-10-CM | POA: Diagnosis present

## 2023-11-09 DIAGNOSIS — Z79899 Other long term (current) drug therapy: Secondary | ICD-10-CM

## 2023-11-09 DIAGNOSIS — Z87891 Personal history of nicotine dependence: Secondary | ICD-10-CM

## 2023-11-09 DIAGNOSIS — Z7952 Long term (current) use of systemic steroids: Secondary | ICD-10-CM

## 2023-11-09 DIAGNOSIS — Z515 Encounter for palliative care: Secondary | ICD-10-CM | POA: Diagnosis not present

## 2023-11-09 DIAGNOSIS — Z83511 Family history of glaucoma: Secondary | ICD-10-CM

## 2023-11-09 DIAGNOSIS — E663 Overweight: Secondary | ICD-10-CM | POA: Diagnosis not present

## 2023-11-09 DIAGNOSIS — J449 Chronic obstructive pulmonary disease, unspecified: Secondary | ICD-10-CM | POA: Diagnosis not present

## 2023-11-09 DIAGNOSIS — C799 Secondary malignant neoplasm of unspecified site: Secondary | ICD-10-CM | POA: Diagnosis present

## 2023-11-09 DIAGNOSIS — I480 Paroxysmal atrial fibrillation: Secondary | ICD-10-CM | POA: Diagnosis not present

## 2023-11-09 DIAGNOSIS — E876 Hypokalemia: Secondary | ICD-10-CM | POA: Diagnosis not present

## 2023-11-09 DIAGNOSIS — I5A Non-ischemic myocardial injury (non-traumatic): Secondary | ICD-10-CM | POA: Diagnosis present

## 2023-11-09 DIAGNOSIS — I10 Essential (primary) hypertension: Secondary | ICD-10-CM | POA: Diagnosis not present

## 2023-11-09 DIAGNOSIS — I4891 Unspecified atrial fibrillation: Secondary | ICD-10-CM | POA: Diagnosis not present

## 2023-11-09 DIAGNOSIS — M1990 Unspecified osteoarthritis, unspecified site: Secondary | ICD-10-CM | POA: Diagnosis present

## 2023-11-09 DIAGNOSIS — J4489 Other specified chronic obstructive pulmonary disease: Secondary | ICD-10-CM | POA: Diagnosis present

## 2023-11-09 DIAGNOSIS — I951 Orthostatic hypotension: Secondary | ICD-10-CM | POA: Diagnosis not present

## 2023-11-09 DIAGNOSIS — Z885 Allergy status to narcotic agent status: Secondary | ICD-10-CM

## 2023-11-09 DIAGNOSIS — E785 Hyperlipidemia, unspecified: Secondary | ICD-10-CM | POA: Diagnosis not present

## 2023-11-09 DIAGNOSIS — I700 Atherosclerosis of aorta: Secondary | ICD-10-CM | POA: Diagnosis not present

## 2023-11-09 DIAGNOSIS — I9589 Other hypotension: Secondary | ICD-10-CM | POA: Diagnosis not present

## 2023-11-09 DIAGNOSIS — Z1152 Encounter for screening for COVID-19: Secondary | ICD-10-CM

## 2023-11-09 DIAGNOSIS — Z8249 Family history of ischemic heart disease and other diseases of the circulatory system: Secondary | ICD-10-CM

## 2023-11-09 DIAGNOSIS — Z803 Family history of malignant neoplasm of breast: Secondary | ICD-10-CM

## 2023-11-09 DIAGNOSIS — Z833 Family history of diabetes mellitus: Secondary | ICD-10-CM

## 2023-11-09 DIAGNOSIS — E7849 Other hyperlipidemia: Secondary | ICD-10-CM | POA: Diagnosis not present

## 2023-11-09 DIAGNOSIS — Z66 Do not resuscitate: Secondary | ICD-10-CM | POA: Diagnosis not present

## 2023-11-09 DIAGNOSIS — N1831 Chronic kidney disease, stage 3a: Secondary | ICD-10-CM | POA: Diagnosis not present

## 2023-11-09 DIAGNOSIS — E669 Obesity, unspecified: Secondary | ICD-10-CM | POA: Diagnosis not present

## 2023-11-09 DIAGNOSIS — I129 Hypertensive chronic kidney disease with stage 1 through stage 4 chronic kidney disease, or unspecified chronic kidney disease: Secondary | ICD-10-CM | POA: Diagnosis present

## 2023-11-09 DIAGNOSIS — M109 Gout, unspecified: Secondary | ICD-10-CM | POA: Diagnosis present

## 2023-11-09 DIAGNOSIS — D649 Anemia, unspecified: Secondary | ICD-10-CM | POA: Diagnosis not present

## 2023-11-09 DIAGNOSIS — Z6830 Body mass index (BMI) 30.0-30.9, adult: Secondary | ICD-10-CM

## 2023-11-09 DIAGNOSIS — J439 Emphysema, unspecified: Secondary | ICD-10-CM | POA: Diagnosis not present

## 2023-11-09 DIAGNOSIS — Z801 Family history of malignant neoplasm of trachea, bronchus and lung: Secondary | ICD-10-CM

## 2023-11-09 DIAGNOSIS — I2489 Other forms of acute ischemic heart disease: Secondary | ICD-10-CM | POA: Diagnosis present

## 2023-11-09 DIAGNOSIS — R0602 Shortness of breath: Secondary | ICD-10-CM | POA: Diagnosis not present

## 2023-11-09 DIAGNOSIS — D630 Anemia in neoplastic disease: Secondary | ICD-10-CM | POA: Diagnosis present

## 2023-11-09 LAB — TROPONIN I (HIGH SENSITIVITY)
Troponin I (High Sensitivity): 105 ng/L (ref <18)
Troponin I (High Sensitivity): 121 ng/L (ref <18)

## 2023-11-09 LAB — URINALYSIS, COMPLETE (UACMP) WITH MICROSCOPIC
Bilirubin Urine: NEGATIVE
Glucose, UA: NEGATIVE mg/dL
Ketones, ur: NEGATIVE mg/dL
Leukocytes,Ua: NEGATIVE
Nitrite: NEGATIVE
Protein, ur: NEGATIVE mg/dL
Specific Gravity, Urine: 1.005 (ref 1.005–1.030)
pH: 6 (ref 5.0–8.0)

## 2023-11-09 LAB — PHOSPHORUS: Phosphorus: 2.1 mg/dL — ABNORMAL LOW (ref 2.5–4.6)

## 2023-11-09 LAB — COMPREHENSIVE METABOLIC PANEL WITH GFR
ALT: 15 U/L (ref 0–44)
AST: 41 U/L (ref 15–41)
Albumin: 2.4 g/dL — ABNORMAL LOW (ref 3.5–5.0)
Alkaline Phosphatase: 80 U/L (ref 38–126)
Anion gap: 10 (ref 5–15)
BUN: 13 mg/dL (ref 8–23)
CO2: 23 mmol/L (ref 22–32)
Calcium: 7.5 mg/dL — ABNORMAL LOW (ref 8.9–10.3)
Chloride: 107 mmol/L (ref 98–111)
Creatinine, Ser: 0.86 mg/dL (ref 0.44–1.00)
GFR, Estimated: >60 mL/min (ref >60)
Glucose, Bld: 93 mg/dL (ref 70–99)
Potassium: 2.6 mmol/L — CL (ref 3.5–5.1)
Sodium: 140 mmol/L (ref 135–145)
Total Bilirubin: 0.9 mg/dL (ref 0.0–1.2)
Total Protein: 5.8 g/dL — ABNORMAL LOW (ref 6.5–8.1)

## 2023-11-09 LAB — HEMOGLOBIN A1C
Hgb A1c MFr Bld: 5.7 % — ABNORMAL HIGH (ref 4.8–5.6)
Mean Plasma Glucose: 116.89 mg/dL

## 2023-11-09 LAB — CBC
HCT: 27.3 % — ABNORMAL LOW (ref 36.0–46.0)
Hemoglobin: 8.9 g/dL — ABNORMAL LOW (ref 12.0–15.0)
MCH: 30.2 pg (ref 26.0–34.0)
MCHC: 32.6 g/dL (ref 30.0–36.0)
MCV: 92.5 fL (ref 80.0–100.0)
Platelets: 465 K/uL — ABNORMAL HIGH (ref 150–400)
RBC: 2.95 MIL/uL — ABNORMAL LOW (ref 3.87–5.11)
RDW: 18.4 % — ABNORMAL HIGH (ref 11.5–15.5)
WBC: 8.5 K/uL (ref 4.0–10.5)
nRBC: 0 % (ref 0.0–0.2)

## 2023-11-09 LAB — RESP PANEL BY RT-PCR (RSV, FLU A&B, COVID)  RVPGX2
Influenza A by PCR: NEGATIVE
Influenza B by PCR: NEGATIVE
Resp Syncytial Virus by PCR: NEGATIVE
SARS Coronavirus 2 by RT PCR: NEGATIVE

## 2023-11-09 LAB — CANCER ANTIGEN 19-9: CA 19-9: 726 U/mL — ABNORMAL HIGH (ref 0–35)

## 2023-11-09 LAB — MAGNESIUM: Magnesium: 1.7 mg/dL (ref 1.7–2.4)

## 2023-11-09 MED ORDER — ONDANSETRON HCL 4 MG/2ML IJ SOLN: 4.0000 mg | Freq: Three times a day (TID) | INTRAMUSCULAR | Status: DC | PRN

## 2023-11-09 MED ORDER — AMIODARONE HCL 200 MG PO TABS
200.0000 mg | ORAL_TABLET | Freq: Every day | ORAL | Status: DC
Start: 2023-11-10 — End: 2023-12-01
  Administered 2023-11-10 – 2023-12-01 (×22): 200 mg via ORAL
  Filled 2023-11-09 (×22): qty 1

## 2023-11-09 MED ORDER — SODIUM CHLORIDE 0.9 % IV BOLUS
1000.0000 mL | Freq: Once | INTRAVENOUS | Status: AC
  Administered 2023-11-09: 1000 mL via INTRAVENOUS

## 2023-11-09 MED ORDER — DM-GUAIFENESIN ER 30-600 MG PO TB12: 1.0000 | ORAL_TABLET | Freq: Two times a day (BID) | ORAL | Status: DC | PRN

## 2023-11-09 MED ORDER — SODIUM CHLORIDE 0.9 % IV SOLN: INTRAVENOUS | Status: DC

## 2023-11-09 MED ORDER — POTASSIUM CHLORIDE 10 MEQ/100ML IV SOLN
10.0000 meq | Freq: Once | INTRAVENOUS | Status: AC
  Administered 2023-11-09: 10 meq via INTRAVENOUS
  Filled 2023-11-09: qty 100

## 2023-11-09 MED ORDER — MAGNESIUM SULFATE 2 GM/50ML IV SOLN
2.0000 g | Freq: Once | INTRAVENOUS | Status: AC
  Administered 2023-11-10: 2 g via INTRAVENOUS
  Filled 2023-11-09: qty 50

## 2023-11-09 MED ORDER — VITAMIN D 25 MCG (1000 UNIT) PO TABS
1000.0000 [IU] | ORAL_TABLET | Freq: Every day | ORAL | Status: DC
Start: 2023-11-10 — End: 2023-12-01
  Administered 2023-11-10 – 2023-12-01 (×22): 1000 [IU] via ORAL
  Filled 2023-11-09 (×22): qty 1

## 2023-11-09 MED ORDER — MEGESTROL ACETATE 20 MG PO TABS
20.0000 mg | ORAL_TABLET | Freq: Two times a day (BID) | ORAL | Status: DC
  Administered 2023-11-10 – 2023-12-01 (×42): 20 mg via ORAL
  Filled 2023-11-09 (×45): qty 1

## 2023-11-09 MED ORDER — ALBUTEROL SULFATE (2.5 MG/3ML) 0.083% IN NEBU: 2.5000 mg | INHALATION_SOLUTION | RESPIRATORY_TRACT | Status: DC | PRN

## 2023-11-09 MED ORDER — BUDESON-GLYCOPYRROL-FORMOTEROL 160-9-4.8 MCG/ACT IN AERO
2.0000 | INHALATION_SPRAY | Freq: Two times a day (BID) | RESPIRATORY_TRACT | Status: DC
  Administered 2023-11-10 – 2023-12-01 (×39): 2 via RESPIRATORY_TRACT
  Filled 2023-11-09 (×3): qty 5.9

## 2023-11-09 MED ORDER — POTASSIUM CHLORIDE CRYS ER 20 MEQ PO TBCR
40.0000 meq | EXTENDED_RELEASE_TABLET | Freq: Once | ORAL | Status: AC
  Administered 2023-11-10: 40 meq via ORAL
  Filled 2023-11-09: qty 2

## 2023-11-09 MED ORDER — POTASSIUM PHOSPHATES 15 MMOLE/5ML IV SOLN
15.0000 mmol | Freq: Once | INTRAVENOUS | Status: AC
  Administered 2023-11-10: 15 mmol via INTRAVENOUS
  Filled 2023-11-09: qty 5

## 2023-11-09 MED ORDER — POTASSIUM CHLORIDE CRYS ER 20 MEQ PO TBCR
40.0000 meq | EXTENDED_RELEASE_TABLET | Freq: Once | ORAL | Status: AC
  Administered 2023-11-09: 40 meq via ORAL
  Filled 2023-11-09: qty 2

## 2023-11-09 MED ORDER — METOPROLOL TARTRATE 5 MG/5ML IV SOLN: 2.5000 mg | INTRAVENOUS | Status: DC | PRN

## 2023-11-09 MED ORDER — ADULT MULTIVITAMIN W/MINERALS CH
1.0000 | ORAL_TABLET | Freq: Every day | ORAL | Status: DC
  Administered 2023-11-10 – 2023-12-01 (×22): 1 via ORAL
  Filled 2023-11-09 (×23): qty 1

## 2023-11-09 MED ORDER — ACETAMINOPHEN 325 MG PO TABS: 650.0000 mg | ORAL_TABLET | Freq: Four times a day (QID) | ORAL | Status: DC | PRN

## 2023-11-09 MED ORDER — ATORVASTATIN CALCIUM 20 MG PO TABS
10.0000 mg | ORAL_TABLET | Freq: Every day | ORAL | Status: DC
  Administered 2023-11-10 – 2023-12-01 (×23): 10 mg via ORAL
  Filled 2023-11-09 (×23): qty 1

## 2023-11-09 MED ORDER — ALLOPURINOL 100 MG PO TABS
200.0000 mg | ORAL_TABLET | Freq: Every day | ORAL | Status: DC
  Administered 2023-11-10 – 2023-12-01 (×22): 200 mg via ORAL
  Filled 2023-11-09 (×22): qty 2

## 2023-11-09 NOTE — ED Provider Notes (Addendum)
 [facility] Provider Note    Event Date/Time   First MD Initiated Contact with Patient 11/09/23 1511     (approximate)  History   Chief Complaint: Weakness  HPI  LOLLIE GUNNER is a 78 y.o. female with a past medical history of asthma, COPD, gastric reflux, hypertension, hyperlipidemia, pancreatic cancer on chemotherapy (last chemo on Tuesday) who presents to the emergency department for generalized weakness.  According to the patient she was trying to get to the kitchen to make something to eat however she was feeling very weak and lowered herself down to the ground due to generalized weakness.  Could not get back up so they called EMS to bring the patient to the emergency department.  Patient makes it very clear that she did not fall to the ground.  Patient has no focal complaints no focal weakness.  Denies any cough congestion denies any chest pain or abdominal pain denies any nausea vomiting diarrhea or urinary symptoms.  Physical Exam   Triage Vital Signs: ED Triage Vitals  Encounter Vitals Group     BP 11/09/23 1503 (!) 100/45     Girls Systolic BP Percentile --      Girls Diastolic BP Percentile --      Boys Systolic BP Percentile --      Boys Diastolic BP Percentile --      Pulse Rate 11/09/23 1503 (!) 108     Resp 11/09/23 1503 20     Temp 11/09/23 1503 97.6 F (36.4 C)     Temp Source 11/09/23 1503 Oral     SpO2 11/09/23 1457 94 %     Weight --      Height --      Head Circumference --      Peak Flow --      Pain Score 11/09/23 1501 0     Pain Loc --      Pain Education --      Exclude from Growth Chart --     Most recent vital signs: Vitals:   11/09/23 1457 11/09/23 1503  BP:  (!) 100/45  Pulse:  (!) 108  Resp:  20  Temp:  97.6 F (36.4 C)  SpO2: 94% 95%    General: Awake, no distress.  CV:  Good peripheral perfusion.  Regular rate and rhythm  Resp:  Normal effort.  Equal breath sounds bilaterally.  Right chest port  present. Abd:  No distention.  Soft, nontender.  No rebound or guarding.  ED Results / Procedures / Treatments   EKG  EKG viewed and interpreted by myself shows what appears to be atrial fibrillation at 142 bpm with a narrow QRS, normal axis, normal intervals besides slight QTc prolongation, nonspecific ST changes.  Significant interference.  RADIOLOGY  I have reviewed interpret the chest x-ray images.  No consolidation on my evaluation. Radiology is read the x-ray is negative.   MEDICATIONS ORDERED IN ED: Medications  sodium chloride  0.9 % bolus 1,000 mL (1,000 mLs Intravenous New Bag/Given 11/09/23 1622)     IMPRESSION / MDM / ASSESSMENT AND PLAN / ED COURSE  I reviewed the triage vital signs and the nursing notes.  Patient's presentation is most consistent with acute presentation with potential threat to life or bodily function.  Patient presents emergency department for generalized weakness.  Patient currently on chemotherapy for pancreatic cancer last chemotherapy was performed yesterday.  Patient has no focal complaints.  Overall reassuring exam.  Patient is asking for  something to eat and drink.  We will check labs will IV hydrate we will continue to closely monitor in the emergency department.  Given the patient's chemotherapy yesterday this could be a result from that.  We will also evaluate for any infectious sources with a urine sample COVID swab and chest x-ray.  Will check broad labs and continue to closely monitor.  Patient agreeable to plan.  Patient's workup today shows a reassuring CBC with baseline anemia.  Patient's urinalysis does not appear to show any significant or obvious urinary tract infection.  Patient's chemistry however does show significant hypokalemia with a potassium of 2.6 which could definitely be contributing to the patient's generalized weakness, also hypocalcemia at 7.5.  Patient's troponin elevated at 105 however I believe this is more likely due to  demand ischemia.  Patient's baseline troponin appears to be around 50 the patient's pulse rate has been around 100 to 120 bpm in atrial fibrillation.  We will dose 10 mg of IV diltiazem.  We will replete potassium with oral and IV potassium.  Will admit to the hospital service for further workup and treatment.  Patient's heart rate is now back down to around 90 to 100 bpm we will hold off on diltiazem.  FINAL CLINICAL IMPRESSION(S) / ED DIAGNOSES   Weakness Hypokalemia   Note:  This document was prepared using Dragon voice recognition software and may include unintentional dictation errors.   Dorothyann Drivers, MD 11/09/23 [place]    Dorothyann Drivers, MD 11/09/23 [phone]

## 2023-11-09 NOTE — ED Triage Notes (Signed)
 Pt to ED with c/o weakness and decreased PO intake. Pt with hx of pancreatic cancer. On chemo, last treatment was on Tuesday. Denies fever, denies n/v/d. BP initially 90/40 with EMS, received approx 600ml NS.

## 2023-11-09 NOTE — H&P (Signed)
 History and Physical    Colleen [name] FMW:[id number] DOB: Jun 08, 1945 DOA: 11/09/2023  Referring MD/NP/PA:   PCP: Leavy Mole, PA-C   Patient coming from:  The patient is coming from home.     Chief Complaint: Weakness  HPI: Colleen [name] is a 79 y.o. female with medical history significant of metastasized stage IV pancreatic cancer on chemotherapy, HTN, HLD, prediabetes, COPD/asthma, gout, GERD, CKD-3, anemia, A-fib and PE not on Eliquis , lumbar spinal stenosis, who presents with weakness.    Patient was recently hospitalized from 10/2 - 10/10 due to SIRS due to worsening anemia.  Hemoglobin down to 6.8, received 1 unit of blood transfusion.  Eliquis  was discontinued as recommended by cardiologist.   Patient states that she has not been feeling well for more than 1 week.  She has poor appetite and decreased oral intake with generalized weakness. Pt states that she was trying to get to the kitchen to make something to eat however she was feeling very weak and lowered herself down to the ground. She could not get up due to weakness. Patient makes it very clear that she did not fall to the ground, no injury.  Patient denies chest pain, cough, SOB.  No nausea, vomiting, diarrhea or abdominal pain.  No rectal bleeding or dark stool.  No symptoms of UTI. Pt has a Port in right upper chest.  Patient was found to have A-fib with RVR in ED with heart rate 102-120 (142 on EKG).  Denies palpitation.   Data reviewed independently and ED Course: pt was found to have WBC 8.5, GFR> 60, troponin 105 --> 121, negative PCR for COVID, flu and RSV, give UA.  Temperature normal, blood pressure 100/45, RR 20, oxygen saturation 95% room air.  Chest x-ray negative.  Patient is placed in the PCU for observation.  EKG: I have personally reviewed.  A-fib, heart rate of 142, QTc 453, low voltage, poor R wave progression, LAD.   Review of Systems:   General: no fevers, chills, no body weight gain, has poor  appetite, has fatigue HEENT: no blurry vision, hearing changes or sore throat Respiratory: no dyspnea, coughing, wheezing CV: no chest pain, no palpitations GI: no nausea, vomiting, abdominal pain, diarrhea, constipation GU: no dysuria, burning on urination, increased urinary frequency, hematuria  Ext: Has trace leg edema Neuro: no unilateral weakness, numbness, or tingling, no vision change or hearing loss Skin: no rash, no skin tear. MSK: No muscle spasm, no deformity, no limitation of range of movement in spin Heme: No easy bruising.  Travel history: No recent long distant travel.   Allergy:  Allergies  Allergen Reactions   Fentanyl  Swelling    It messes with her heart.    Nsaids     Other Reaction(s): Not available   Oxycodone  Swelling    Past Medical History:  Diagnosis Date   Asthma    Cataract    COPD (chronic obstructive pulmonary disease) (HCC)    GERD (gastroesophageal reflux disease)    Gout    History of pulmonary embolus (PE)    Hyperlipidemia    Hypertension    Osteoarthrosis    Prediabetes     Past Surgical History:  Procedure Laterality Date   BACK SURGERY     BREAST BIOPSY Left 04/08/2014   intraductal papilloma 5 mm   COLONOSCOPY WITH PROPOFOL  N/A 04/23/2020   Procedure: COLONOSCOPY WITH PROPOFOL ;  Surgeon: Janalyn Keene NOVAK, MD;  Location: [facility] ENDOSCOPY;  Service: Endoscopy;  Laterality: N/A;  ERCP N/A 08/05/2023   Procedure: ERCP, WITH INTERVENTION IF INDICATED;  Surgeon: Jinny Carmine, MD;  Location: [facility] ENDOSCOPY;  Service: Endoscopy;  Laterality: N/A;   ESOPHAGOGASTRODUODENOSCOPY (EGD) WITH PROPOFOL  N/A 04/23/2020   Procedure: ESOPHAGOGASTRODUODENOSCOPY (EGD) WITH PROPOFOL ;  Surgeon: Janalyn Keene NOVAK, MD;  Location: [facility] ENDOSCOPY;  Service: Endoscopy;  Laterality: N/A;   GIVENS CAPSULE STUDY N/A 01/28/2022   Procedure: GIVENS CAPSULE STUDY;  Surgeon: Therisa Bi, MD;  Location: [facility] ENDOSCOPY;  Service: Gastroenterology;  Laterality:  N/A;   IR IMAGING GUIDED PORT INSERTION  09/12/2023   IR INT EXT BILIARY DRAIN WITH CHOLANGIOGRAM  08/08/2023   IR RADIOLOGIST EVAL & MGMT  09/22/2023   IR REMOVAL BILIARY DRAIN  09/22/2023   LUMBAR LAMINECTOMY     TONSILLECTOMY      Social History:  reports that she quit smoking about 14 years ago. Her smoking use included cigarettes. She started smoking about 35 years ago. She has a 30 pack-year smoking history. She has never used smokeless tobacco. She reports that she does not currently use alcohol. She reports that she does not use drugs.  Family History:  Family History  Problem Relation Age of Onset   Diabetes Mother    Lung cancer Father    Diabetes Sister    Congestive Heart Failure Sister    Glaucoma Sister    Breast cancer Maternal Aunt      Prior to Admission medications   Medication Sig Start Date End Date Taking? Authorizing Provider  acetaminophen  (TYLENOL ) 650 MG CR tablet Take 650 mg by mouth every 8 (eight) hours as needed for pain.    [provider]  albuterol  (VENTOLIN  HFA) 108 (90 Base) MCG/ACT inhaler Inhale 2 puffs into the lungs every 4 (four) hours as needed for wheezing or shortness of breath. 03/31/23   Tapia, Leisa, PA-C  allopurinol  (ZYLOPRIM ) 100 MG tablet Take 2 tablets (200 mg total) by mouth daily. 08/01/23   Tapia, Leisa, PA-C  amiodarone  (PACERONE ) 200 MG tablet Take 1 tablet (200 mg total) by mouth daily. 11/04/23   Wouk, Devaughn Sayres, MD  Blood Pressure Monitoring (ADULT BLOOD PRESSURE CUFF LG) KIT 1 each by Does not apply route daily. 09/01/21   Bernardo Fend, DO  BREZTRI  AEROSPHERE 160-9-4.8 MCG/ACT AERO inhaler INHALE 2 PUFFS TWICE DAILY 08/19/23   Tapia, Leisa, PA-C  calcium  carbonate (CALCIUM  600) 600 MG TABS tablet Take 2 tablets (1,200 mg total) by mouth daily with breakfast. 10/10/23   Dasie Tinnie MATSU, NP  cholecalciferol (VITAMIN D3) 25 MCG (1000 UNIT) tablet Take 1,000 Units by mouth daily.    [provider]  docusate sodium  (COLACE) 100 MG capsule Take 1 capsule (100 mg total) by mouth 2 (two) times daily. 11/01/23   Patel, Sona, MD  Famotidine  (PEPCID  PO) Take 1 tablet by mouth at bedtime.    [provider]  lidocaine -prilocaine  (EMLA ) cream Apply to affected area once 09/07/23   Jacobo Evalene PARAS, MD  magic mouthwash (lidocaine , diphenhydrAMINE, alum & mag hydroxide) suspension Swish and spit 5 mLs 4 (four) times daily as needed for mouth pain. 10/01/23   Babara Call, MD  megestrol  (MEGACE ) 20 MG tablet Take 1 tablet (20 mg total) by mouth 2 (two) times daily. 10/18/23   Jacobo Evalene PARAS, MD  Multiple Vitamins-Minerals (MULTIVITAMIN WOMEN 50+ PO) Take 1 tablet by mouth daily.    [provider]  ondansetron  (ZOFRAN ) 8 MG tablet Take 1 tablet (8 mg total) by mouth every 8 (eight) hours  as needed for nausea or vomiting. 09/07/23   Jacobo Evalene PARAS, MD  polyethylene glycol (MIRALAX  / GLYCOLAX ) 17 g packet Take 17 g by mouth 2 (two) times daily. 11/01/23   Patel, Sona, MD  potassium chloride  SA (KLOR-CON  M) 20 MEQ tablet Take 1 tablet (20 mEq total) by mouth daily. Patient not taking: Reported on 11/08/2023 11/04/23   Wouk, Devaughn Sayres, MD  simethicone  (GAS-X) 80 MG chewable tablet Chew 1 tablet (80 mg total) by mouth 2 (two) times a day. 08/02/18 06/17/28  Dorothyann Drivers, MD    Physical Exam: Vitals:   11/09/23 1503 11/09/23 1848 11/09/23 2000 11/09/23 2144  BP: (!) 100/45 (!) 106/57 (!) 101/59 (!) 105/57  Pulse: (!) 108 (!) 105 92 94  Resp: 20 18 17 14   Temp: 97.6 F (36.4 C) 97.8 F (36.6 C)  97.6 F (36.4 C)  TempSrc: Oral   Oral  SpO2: 95% 95% 94% 95%   General: Not in acute distress HEENT:       Eyes: PERRL, EOMI, no jaundice       ENT: No discharge from the ears and nose, no pharynx injection, no tonsillar enlargement.        Neck: No JVD, no bruit, no mass felt. Heme: No neck lymph node enlargement. Cardiac: S1/S2, irregularly irregular rhythm, no gallops or rubs. Respiratory:  No rales, wheezing, rhonchi or rubs. GI: Soft, nondistended, nontender, no rebound pain, no organomegaly, BS present. GU: No hematuria Ext: Has trace leg edema bilaterally. 1+DP/PT pulse bilaterally. Musculoskeletal: No joint deformities, No joint redness or warmth, no limitation of ROM in spin. Skin: No rashes.  Neuro: Alert, oriented X3, cranial nerves II-XII grossly intact, moves all extremities normally. Psych: Patient is not psychotic, no suicidal or hemocidal ideation.  Labs on Admission: I have personally reviewed following labs and imaging studies  CBC: Recent Labs  Lab 11/03/23 0528 11/04/23 0555 11/08/23 0811 11/09/23 1617  WBC 3.4* 3.4* 5.5 8.5  NEUTROABS  --   --  3.0  --   HGB 7.6* 7.9* 9.0* 8.9*  HCT 22.4* 23.2* 26.9* 27.3*  MCV 89.6 91.3 89.7 92.5  PLT 113* 174 534* 465*   Basic Metabolic Panel: Recent Labs  Lab 11/03/23 0528 11/03/23 1402 11/04/23 0555 11/08/23 0811 11/09/23 1617  NA 136  --  137 134* 140  K 2.6* 3.0* 3.6 2.8* 2.6*  CL 110  --  109 105 107  CO2 24  --  23 20* 23  GLUCOSE 86  --  110* 85 93  BUN 11  --  12 16 13   CREATININE 0.78  --  0.80 1.07* 0.86  CALCIUM  7.9*  --  8.2* 8.3* 7.5*  MG  --  1.6*  --   --  1.7  PHOS  --   --   --   --  2.1*   GFR: Estimated Creatinine Clearance: 54.3 mL/min (by C-G formula based on SCr of 0.86 mg/dL). Liver Function Tests: Recent Labs  Lab 11/08/23 0811 11/09/23 1617  AST 27 41  ALT 14 15  ALKPHOS 93 80  BILITOT 0.9 0.9  PROT 6.0* 5.8*  ALBUMIN 2.5* 2.4*   No results for input(s): LIPASE, AMYLASE in the last 168 hours. No results for input(s): AMMONIA in the last 168 hours. Coagulation Profile: No results for input(s): INR, PROTIME in the last 168 hours. Cardiac Enzymes: No results for input(s): CKTOTAL, CKMB, CKMBINDEX, TROPONINI in the last 168 hours. BNP (last 3 results) No results for input(s):  PROBNP in the last 8760 hours. HbA1C: No results for input(s):  HGBA1C in the last 72 hours. CBG: No results for input(s): GLUCAP in the last 168 hours. Lipid Profile: No results for input(s): CHOL, HDL, LDLCALC, TRIG, CHOLHDL, LDLDIRECT in the last 72 hours. Thyroid  Function Tests: No results for input(s): TSH, T4TOTAL, FREET4, T3FREE, THYROIDAB in the last 72 hours. Anemia Panel: No results for input(s): VITAMINB12, FOLATE, FERRITIN, TIBC, IRON, RETICCTPCT in the last 72 hours. Urine analysis:    Component Value Date/Time   COLORURINE STRAW (A) 11/09/2023 1650   APPEARANCEUR CLEAR (A) 11/09/2023 1650   LABSPEC 1.005 11/09/2023 1650   PHURINE 6.0 11/09/2023 1650   GLUCOSEU NEGATIVE 11/09/2023 1650   HGBUR LARGE (A) 11/09/2023 1650   BILIRUBINUR NEGATIVE 11/09/2023 1650   BILIRUBINUR negative 07/26/2022 1046   KETONESUR NEGATIVE 11/09/2023 1650   PROTEINUR NEGATIVE 11/09/2023 1650   UROBILINOGEN 0.2 07/26/2022 1046   NITRITE NEGATIVE 11/09/2023 1650   LEUKOCYTESUR NEGATIVE 11/09/2023 1650   Sepsis Labs: @LABRCNTIP (procalcitonin:4,lacticidven:4) ) Recent Results (from the past 240 hours)  Resp panel by RT-PCR (RSV, Flu A&B, Covid) Urine, Clean Catch     Status: None   Collection Time: 11/09/23  4:50 PM   Specimen: Urine, Clean Catch; Nasal Swab  Result Value Ref Range Status   SARS Coronavirus 2 by RT PCR NEGATIVE NEGATIVE Final    Comment: (NOTE) SARS-CoV-2 target nucleic acids are NOT DETECTED.  The SARS-CoV-2 RNA is generally detectable in upper respiratory specimens during the acute phase of infection. The lowest concentration of SARS-CoV-2 viral copies this assay can detect is 138 copies/mL. A negative result does not preclude SARS-Cov-2 infection and should not be used as the sole basis for treatment or other patient management decisions. A negative result may occur with  improper specimen collection/handling, submission of specimen other than nasopharyngeal swab, presence of viral  mutation(s) within the areas targeted by this assay, and inadequate number of viral copies(<138 copies/mL). A negative result must be combined with clinical observations, patient history, and epidemiological information. The expected result is Negative.  Fact Sheet for Patients:  BloggerCourse.com  Fact Sheet for Healthcare Providers:  SeriousBroker.it  This test is no t yet approved or cleared by the [place]  FDA and  has been authorized for detection and/or diagnosis of SARS-CoV-2 by FDA under an Emergency Use Authorization (EUA). This EUA will remain  in effect (meaning this test can be used) for the duration of the COVID-19 declaration under Section 564(b)(1) of the Act, 21 U.S.C.section 360bbb-3(b)(1), unless the authorization is terminated  or revoked sooner.       Influenza A by PCR NEGATIVE NEGATIVE Final   Influenza B by PCR NEGATIVE NEGATIVE Final    Comment: (NOTE) The Xpert Xpress SARS-CoV-2/FLU/RSV plus assay is intended as an aid in the diagnosis of influenza from Nasopharyngeal swab specimens and should not be used as a sole basis for treatment. Nasal washings and aspirates are unacceptable for Xpert Xpress SARS-CoV-2/FLU/RSV testing.  Fact Sheet for Patients: BloggerCourse.com  Fact Sheet for Healthcare Providers: SeriousBroker.it  This test is not yet approved or cleared by the [place]  FDA and has been authorized for detection and/or diagnosis of SARS-CoV-2 by FDA under an Emergency Use Authorization (EUA). This EUA will remain in effect (meaning this test can be used) for the duration of the COVID-19 declaration under Section 564(b)(1) of the Act, 21 U.S.C. section 360bbb-3(b)(1), unless the authorization is terminated or revoked.     Resp Syncytial Virus by  PCR NEGATIVE NEGATIVE Final    Comment: (NOTE) Fact Sheet for  Patients: BloggerCourse.com  Fact Sheet for Healthcare Providers: SeriousBroker.it  This test is not yet approved or cleared by the [place]  FDA and has been authorized for detection and/or diagnosis of SARS-CoV-2 by FDA under an Emergency Use Authorization (EUA). This EUA will remain in effect (meaning this test can be used) for the duration of the COVID-19 declaration under Section 564(b)(1) of the Act, 21 U.S.C. section 360bbb-3(b)(1), unless the authorization is terminated or revoked.  Performed at [facility], [address] [place], [place] [address]      Radiological Exams on Admission:   Assessment/Plan Principal Problem:   Atrial fibrillation with RVR (HCC) Active Problems:   Myocardial injury   Hypokalemia   Hypophosphatemia   Primary pancreatic cancer with metastasis to other site [facility])   Benign essential HTN   HLD (hyperlipidemia)   COPD (chronic obstructive pulmonary disease) (HCC)   Gout   Chronic kidney disease, stage 3a (HCC)   Generalized weakness   Normocytic anemia   Assessment and Plan:   Atrial fibrillation with RVR (HCC): Heart rate up to 142 by EKG, currently heart rate 100-120s in Ed.  - Place the PCU for observation - Continue home amiodarone  200 mg daily - As needed metoprolol  2.5 mg every 2 hour for heart rate> 125 - Replete potassium and give magnesium  sulfate as below  Myocardial injury: Troponin 105 --> 121, no chest pain, likely due to demand ischemia.  Patient has chronically elevated troponin 40-50s. - Trend troponin - pt states that she can not tolerate ASA due to stomach issue - Check A1c, FLP - Start Lipitor 10 mg daily  Hypokalemia and hypophosphatemia: Potassium 2.6, phosphorus 2.1, magnesium  1.7 - Repleted potassium and phosphorus - Also give 2 g magnesium  sulfate due to A-fib with RVR  Primary pancreatic cancer with metastasis to other site [facility]): Last  chemo was on Tuesday -Follow-up with Dr. Cora  Benign essential HTN: Blood pressure soft.  Patient is not taking blood pressure medications currently. -Observe closely  HLD (hyperlipidemia) -started Lipitor 10 mg daily due to myocardial injury  COPD (chronic obstructive pulmonary disease) (HCC): Stable -Bronchodilators as needed Mucinex   Gout: -Allopurinol   Chronic kidney disease, stage 3a (HCC): Stable, GFR> 60 -Follow-up with BMP  Generalized weakness: Likely multifactoral etiology - Fall precaution - PT/OT  Normocytic anemia: Most recent iron studies showed a picture compatible with anemia secondary to chronic illness/cancer.  Due to worsening anemia, received 1 unit of blood in recent admission.  Her hemoglobin is stable 8.9 today (9.0 on 11/08/2023) -CBC     DVT ppx: SCD  Code Status: Full code per pt  Family Communication:    Yes, patient's sister  at bed side.     Disposition Plan:  Anticipate discharge back to previous environment  Consults called:  none  Admission status and Level of care: Progressive:    for obs     Dispo: The patient is from: Home              Anticipated d/c is to: Home              Anticipated d/c date is: 1 day              Patient currently is not medically stable to d/c.    Severity of Illness:  The appropriate patient status for this patient is OBSERVATION. Observation status is judged to be reasonable and necessary  in order to provide the required intensity of service to ensure the patient's safety. The patient's presenting symptoms, physical exam findings, and initial radiographic and laboratory data in the context of their medical condition is felt to place them at decreased risk for further clinical deterioration. Furthermore, it is anticipated that the patient will be medically stable for discharge from the hospital within 2 midnights of admission.        Date of Service 11/09/2023    [facility]   If 7PM-7AM, please contact night-coverage [email or web address] 11/09/2023, 10:09 PM

## 2023-11-10 DIAGNOSIS — Z87891 Personal history of nicotine dependence: Secondary | ICD-10-CM | POA: Diagnosis not present

## 2023-11-10 DIAGNOSIS — R7303 Prediabetes: Secondary | ICD-10-CM | POA: Diagnosis present

## 2023-11-10 DIAGNOSIS — M1990 Unspecified osteoarthritis, unspecified site: Secondary | ICD-10-CM | POA: Diagnosis present

## 2023-11-10 DIAGNOSIS — R531 Weakness: Secondary | ICD-10-CM | POA: Diagnosis not present

## 2023-11-10 DIAGNOSIS — Z66 Do not resuscitate: Secondary | ICD-10-CM | POA: Diagnosis not present

## 2023-11-10 DIAGNOSIS — I480 Paroxysmal atrial fibrillation: Secondary | ICD-10-CM | POA: Diagnosis present

## 2023-11-10 DIAGNOSIS — C799 Secondary malignant neoplasm of unspecified site: Secondary | ICD-10-CM | POA: Diagnosis present

## 2023-11-10 DIAGNOSIS — Z1152 Encounter for screening for COVID-19: Secondary | ICD-10-CM | POA: Diagnosis not present

## 2023-11-10 DIAGNOSIS — I5A Non-ischemic myocardial injury (non-traumatic): Secondary | ICD-10-CM | POA: Diagnosis present

## 2023-11-10 DIAGNOSIS — E785 Hyperlipidemia, unspecified: Secondary | ICD-10-CM | POA: Diagnosis present

## 2023-11-10 DIAGNOSIS — D630 Anemia in neoplastic disease: Secondary | ICD-10-CM | POA: Diagnosis present

## 2023-11-10 DIAGNOSIS — Z8249 Family history of ischemic heart disease and other diseases of the circulatory system: Secondary | ICD-10-CM | POA: Diagnosis not present

## 2023-11-10 DIAGNOSIS — I129 Hypertensive chronic kidney disease with stage 1 through stage 4 chronic kidney disease, or unspecified chronic kidney disease: Secondary | ICD-10-CM | POA: Diagnosis present

## 2023-11-10 DIAGNOSIS — I2489 Other forms of acute ischemic heart disease: Secondary | ICD-10-CM | POA: Diagnosis present

## 2023-11-10 DIAGNOSIS — J4489 Other specified chronic obstructive pulmonary disease: Secondary | ICD-10-CM | POA: Diagnosis present

## 2023-11-10 DIAGNOSIS — R0602 Shortness of breath: Secondary | ICD-10-CM | POA: Diagnosis present

## 2023-11-10 DIAGNOSIS — Z79899 Other long term (current) drug therapy: Secondary | ICD-10-CM | POA: Diagnosis not present

## 2023-11-10 DIAGNOSIS — M109 Gout, unspecified: Secondary | ICD-10-CM | POA: Diagnosis present

## 2023-11-10 DIAGNOSIS — E876 Hypokalemia: Secondary | ICD-10-CM | POA: Diagnosis present

## 2023-11-10 DIAGNOSIS — E669 Obesity, unspecified: Secondary | ICD-10-CM | POA: Diagnosis present

## 2023-11-10 DIAGNOSIS — I4891 Unspecified atrial fibrillation: Secondary | ICD-10-CM | POA: Diagnosis not present

## 2023-11-10 DIAGNOSIS — I9589 Other hypotension: Secondary | ICD-10-CM | POA: Diagnosis not present

## 2023-11-10 DIAGNOSIS — C259 Malignant neoplasm of pancreas, unspecified: Secondary | ICD-10-CM | POA: Diagnosis present

## 2023-11-10 DIAGNOSIS — I951 Orthostatic hypotension: Secondary | ICD-10-CM | POA: Diagnosis not present

## 2023-11-10 DIAGNOSIS — N1831 Chronic kidney disease, stage 3a: Secondary | ICD-10-CM | POA: Diagnosis present

## 2023-11-10 DIAGNOSIS — Z515 Encounter for palliative care: Secondary | ICD-10-CM | POA: Diagnosis not present

## 2023-11-10 DIAGNOSIS — Z833 Family history of diabetes mellitus: Secondary | ICD-10-CM | POA: Diagnosis not present

## 2023-11-10 LAB — CBC
HCT: 29.1 % — ABNORMAL LOW (ref 36.0–46.0)
Hemoglobin: 9.1 g/dL — ABNORMAL LOW (ref 12.0–15.0)
MCH: 29.9 pg (ref 26.0–34.0)
MCHC: 31.3 g/dL (ref 30.0–36.0)
MCV: 95.7 fL (ref 80.0–100.0)
Platelets: 397 K/uL (ref 150–400)
RBC: 3.04 MIL/uL — ABNORMAL LOW (ref 3.87–5.11)
RDW: 18.8 % — ABNORMAL HIGH (ref 11.5–15.5)
WBC: 8 K/uL (ref 4.0–10.5)
nRBC: 0 % (ref 0.0–0.2)

## 2023-11-10 LAB — TROPONIN I (HIGH SENSITIVITY)
Troponin I (High Sensitivity): 136 ng/L (ref <18)
Troponin I (High Sensitivity): 134 ng/L (ref <18)

## 2023-11-10 LAB — MAGNESIUM: Magnesium: 2.6 mg/dL — ABNORMAL HIGH (ref 1.7–2.4)

## 2023-11-10 LAB — BASIC METABOLIC PANEL WITH GFR
Anion gap: 12 (ref 5–15)
BUN: 12 mg/dL (ref 8–23)
CO2: 18 mmol/L — ABNORMAL LOW (ref 22–32)
Calcium: 7.2 mg/dL — ABNORMAL LOW (ref 8.9–10.3)
Chloride: 110 mmol/L (ref 98–111)
Creatinine, Ser: 0.85 mg/dL (ref 0.44–1.00)
GFR, Estimated: >60 mL/min (ref >60)
Glucose, Bld: 124 mg/dL — ABNORMAL HIGH (ref 70–99)
Potassium: 3.2 mmol/L — ABNORMAL LOW (ref 3.5–5.1)
Sodium: 140 mmol/L (ref 135–145)

## 2023-11-10 LAB — LIPID PANEL
Cholesterol: 105 mg/dL (ref 0–200)
HDL: 39 mg/dL — ABNORMAL LOW (ref >40)
LDL Cholesterol: 53 mg/dL (ref 0–99)
Total CHOL/HDL Ratio: 2.7 ratio
Triglycerides: 65 mg/dL (ref <150)
VLDL: 13 mg/dL (ref 0–40)

## 2023-11-10 LAB — PHOSPHORUS: Phosphorus: 2.2 mg/dL — ABNORMAL LOW (ref 2.5–4.6)

## 2023-11-10 MED ORDER — POLYETHYLENE GLYCOL 3350 17 G PO PACK
17.0000 g | PACK | Freq: Two times a day (BID) | ORAL | Status: DC | PRN
Start: 2023-11-10 — End: 2023-12-01

## 2023-11-10 MED ORDER — SIMETHICONE 80 MG PO CHEW: 80.0000 mg | CHEWABLE_TABLET | Freq: Four times a day (QID) | ORAL | Status: DC | PRN

## 2023-11-10 MED ORDER — DOCUSATE SODIUM 100 MG PO CAPS
100.0000 mg | ORAL_CAPSULE | Freq: Two times a day (BID) | ORAL | Status: DC | PRN
  Administered 2023-11-17: 100 mg via ORAL
  Filled 2023-11-10: qty 1

## 2023-11-10 NOTE — TOC Initial Note (Signed)
 Transition of Care [facility]) - Initial/Assessment Note    Patient Details  Name: Colleen [name] MRN: [id number] Date of Birth: 12-27-1945  [facility]) CM/SW Contact:    [place] L Grae Cannata, LCSW Phone Number: 11/10/2023, 11:38 AM  Clinical Narrative:                    CSW met with patient. No family at bedside. CSW called the niece, Ms. Deward, at the request of patient. Ms. Deward and patient agreed to SNF placement. Ms. Deward advised that she spoke with [facility] and there is a bed pending. She stated that CSW should search for beds at [facility] and [facility].        Patient Goals and CMS Choice            Expected Discharge Plan and Services                                              Prior Living Arrangements/Services                       Activities of Daily Living      Permission Sought/Granted                  Emotional Assessment              Admission diagnosis:  Atrial fibrillation with RVR (HCC) [I48.91] Patient Active Problem List   Diagnosis Date Noted   Myocardial injury 11/09/2023   Overweight (BMI 25.0-29.9) 11/09/2023   Hypophosphatemia 11/09/2023   Normocytic anemia 11/09/2023   Generalized weakness 11/09/2023   Bronchitis 10/28/2023   Right hip pain 10/27/2023   Afib (HCC) 10/27/2023   Mucositis due to chemotherapy 10/05/2023   Odynophagia 10/03/2023   Pressure injury of skin 10/03/2023   Hypotension 10/02/2023   Dysphasia 10/01/2023   Dysphagia 10/01/2023   Primary pancreatic cancer with metastasis to other site (HCC) 09/07/2023   Protein-calorie malnutrition, moderate 09/01/2023   Hyponatremia 08/30/2023   Abnormal LFTs 08/04/2023   Chronic kidney disease, stage 3a (HCC) 08/04/2023   Obesity (BMI 30-39.9) 08/04/2023   Atrial fibrillation, chronic (HCC) 08/04/2023   Pulmonary embolism (HCC) 08/04/2023   Hypokalemia 08/04/2023   Pancreatic mass 08/04/2023   New onset type 2  diabetes mellitus (HCC) 04/04/2023   Acquired trigger finger of right middle finger 07/24/2021   Acquired trigger finger of right ring finger 07/24/2021   Trigger finger of right hand 06/26/2021   Hidradenitis suppurativa 11/12/2020   Status post gastroplasty-1980s 06/25/2020   CLE (columnar lined esophagus)    Hiatal hernia    Gastric nodule    Angiodysplasia of stomach and duodenum    Cecal polyp    Polyp of descending colon    Polyp of transverse colon    Atrial fibrillation with RVR (HCC) 06/14/2019   Current use of anticoagulant therapy 06/14/2019   Iron deficiency anemia 06/14/2019   Essential (hemorrhagic) thrombocythemia (HCC) 06/14/2019   GERD (gastroesophageal reflux disease) 04/15/2019   Chronic low back pain 04/15/2019   Right pulmonary embolus 04/15/2019   DOE (dyspnea on exertion) 03/13/2019   Anemia of chronic disease 03/07/2019   Atherosclerosis of both carotid arteries 09/20/2018   Spinal stenosis of lumbar region with neurogenic claudication 09/20/2018   Aortic atherosclerosis 09/05/2018   At high risk for  falls 09/05/2018   Osteopenia 05/18/2017   Stage 3 chronic kidney disease (HCC) 04/21/2017   Morbid obesity (HCC) 04/21/2017   Trigger middle finger of left hand 02/28/2017   Chronic gouty arthropathy without tophi 11/29/2016   Hypergammaglobulinemia 11/29/2016   Osteoarthritis of both knees 11/29/2016   Bilateral primary osteoarthritis of knee 11/29/2016   Allergic rhinitis, seasonal 07/30/2014   Gout 07/30/2014   HLD (hyperlipidemia) 07/30/2014   Osteoarthritis of knee 07/30/2014   Prediabetes 07/30/2014   Asthma, mild persistent 07/30/2014   COPD (chronic obstructive pulmonary disease) (HCC) 07/09/2008   Benign essential HTN 06/22/2006   PCP:  Leavy Mole, PA-C Pharmacy:   [facility] [address]), [place] - [address] [place]) [place] [address] Phone: [phone] Fax:  [phone]     Social Drivers of Health (SDOH) Social History: SDOH Screenings   Food Insecurity: Food Insecurity Present (10/29/2023)  Housing: Low Risk  (10/29/2023)  Transportation Needs: No Transportation Needs (10/29/2023)  Recent Concern: Transportation Needs - Unmet Transportation Needs (10/01/2023)  Utilities: At Risk (10/29/2023)  Alcohol Screen: Low Risk  (12/02/2022)  Depression (PHQ2-9): Low Risk  (11/08/2023)  Financial Resource Strain: Low Risk  (12/02/2022)  Physical Activity: Insufficiently Active (12/02/2022)  Social Connections: Socially Isolated (10/29/2023)  Stress: No Stress Concern Present (12/02/2022)  Tobacco Use: Medium Risk (11/08/2023)  Health Literacy: Adequate Health Literacy (12/02/2022)   SDOH Interventions:     Readmission Risk Interventions    10/28/2023    4:03 PM 10/03/2023    3:20 PM  Readmission Risk Prevention Plan  Transportation Screening Complete Complete  Medication Review Oceanographer) Complete Complete  PCP or Specialist appointment within 3-5 days of discharge Complete Complete  HRI or Home Care Consult Complete   SW Recovery Care/Counseling Consult Complete Complete  Palliative Care Screening Not Applicable Not Applicable  Skilled Nursing Facility Not Applicable Not Applicable

## 2023-11-10 NOTE — ED Notes (Signed)
 Lab did not call regarding critical troponin lab

## 2023-11-10 NOTE — Hospital Course (Addendum)
 10/15: Admitted for weakness and found to be in atrial fibrillation with RVR; rate improved without specific intervention. 10/16: Continued weakness; PT/OT recommended SNF placement. TOC consulted. 10/17-10/21: Awaiting SNF placement. 10/22: TOC awaiting insurance authorization for rehab. 10/23: Peer-to-peer review completed with insurance physician representative. 10/24: Patient felt unwell on standing; still unsafe for discharge. Midodrine  increased to 10 mg TID. 10/25: Patient remained orthostatic; received IV fluid bolus. Family initiated appeal of rehab denial. 10/26-10/29: Continued awaiting insurance appeal decision. Midodrine  increased to 15 mg TID on 10/28. 11/2: Ongoing wait for insurance decision regarding SNF placement appeal. 11/4: Appeal denied. Patient continued to be unsafe for discharge; started on Florinef 0.1 mg daily. 11/5: Reported symptomatic improvement and denied dizziness with position changes. Expressed desire for discharge due to hospitalization costs; monitored overnight for stability. No response received regarding second peer-to-peer appeal. 11/6: Patient continued to be orthostatic despite medical therapy. Declined TED hose and abdominal binder. Discharged home with maximal home health PT/OT and instructions for close follow-up with PCP and oncology.

## 2023-11-10 NOTE — Evaluation (Signed)
 Physical Therapy Evaluation Patient Details Name: RAYNEE MCCASLAND MRN: [id number] DOB: 1945-05-20 Today's Date: 11/10/2023  History of Present Illness  Patient is a 78 year old female with weakness, A-fib with RVR. Recent hospitalization with SIRS due to worsening anemia. PMH: metastasized stage IV pancreatic cancer on chemotherapy, HTN, HLD, prediabetes, COPD/asthma, gout, GERD, CKD-3, anemia, A-fib and PE not on Eliquis , lumbar spinal stenosis   Clinical Impression  Patient is agreeable to PT session. She reports having PRN assistance from niece since recent hospital stay. She is ambulatory short distance with rolling walker. She reports one fall at home.  Today the patient is fatigued after ambulating a short distance with the rolling walker. She is generally weak and required assistance with transfers and ambulation. Anticipate patient could benefit from rehabilitation < 3 hours/day after this hospital stay. PT will continue to follow to maximize independence and facilitate return to prior level of function.     If plan is discharge home, recommend the following: A little help with walking and/or transfers;A little help with bathing/dressing/bathroom   Can travel by private vehicle   No    Equipment Recommendations None recommended by PT  Recommendations for Other Services       Functional Status Assessment Patient has had a recent decline in their functional status and demonstrates the ability to make significant improvements in function in a reasonable and predictable amount of time.     Precautions / Restrictions Precautions Precautions: Fall Recall of Precautions/Restrictions: Impaired Restrictions Weight Bearing Restrictions Per Provider Order: No      Mobility  Bed Mobility Overal bed mobility: Needs Assistance Bed Mobility: Supine to Sit, Sit to Supine     Supine to sit: Supervision Sit to supine: Supervision   General bed mobility comments: increased time and  effort required    Transfers Overall transfer level: Needs assistance Equipment used: Rolling walker (2 wheels) Transfers: Sit to/from Stand Sit to Stand: Min assist, Mod assist, +2 physical assistance, +2 safety/equipment           General transfer comment: Mod A +2 person for first stand from bed. Min A +1 for further standing bouts. cues for technique    Ambulation/Gait Ambulation/Gait assistance: Min assist Gait Distance (Feet): 45 Feet Assistive device: Rolling walker (2 wheels) Gait Pattern/deviations: Step-through pattern, Decreased stride length, Trunk flexed Gait velocity: decreased     General Gait Details: cues to stand closer to rolling walker for support. patient is fatigued with activity. unable to walk back to room after walking in the hallway bathroom, requiring transport chair to make it back  Stairs            Wheelchair Mobility     Tilt Bed    Modified Rankin (Stroke Patients Only)       Balance Overall balance assessment: Needs assistance Sitting-balance support: Feet supported, No upper extremity supported Sitting balance-Leahy Scale: Fair     Standing balance support: Bilateral upper extremity supported, Single extremity supported, During functional activity, Reliant on assistive device for balance Standing balance-Leahy Scale: Poor Standing balance comment: relying on at least unilateral UE support, occasional UE support provided                             Pertinent Vitals/Pain Pain Assessment Pain Assessment: No/denies pain    Home Living Family/patient expects to be discharged to:: Private residence Living Arrangements: Alone Available Help at Discharge: Family;Available PRN/intermittently Type of Home: House  Home Access: Stairs to enter Entrance Stairs-Rails: Right Entrance Stairs-Number of Steps: 4   Home Layout: One level Home Equipment: Agricultural consultant (2 wheels);Cane - single point;Tub bench Additional  Comments: info pulled from very recent hospital admission    Prior Function Prior Level of Function : Independent/Modified Independent             Mobility Comments: uses SPC for mobility, reports she has been using walker more since last admission ADLs Comments: Pt is Ind in self care and IADLs. She no longer drives but niece drives to appts and get groceries.     Extremity/Trunk Assessment   Upper Extremity Assessment Upper Extremity Assessment: Generalized weakness    Lower Extremity Assessment Lower Extremity Assessment: Generalized weakness       Communication   Communication Communication: No apparent difficulties    Cognition Arousal: Alert Behavior During Therapy: WFL for tasks assessed/performed   PT - Cognitive impairments: No apparent impairments                         Following commands: Intact       Cueing Cueing Techniques: Verbal cues     General Comments General comments (skin integrity, edema, etc.): HR up to 120's with exertion    Exercises     Assessment/Plan    PT Assessment Patient needs continued PT services  PT Problem List Decreased strength;Decreased activity tolerance;Decreased balance;Decreased mobility;Decreased safety awareness;Decreased knowledge of use of DME       PT Treatment Interventions DME instruction;Gait training;Stair training;Functional mobility training;Therapeutic activities;Therapeutic exercise;Balance training;Neuromuscular re-education;Cognitive remediation;Patient/family education    PT Goals (Current goals can be found in the Care Plan section)  Acute Rehab PT Goals Patient Stated Goal: to go to rehab for 2 weeks PT Goal Formulation: With patient Time For Goal Achievement: 11/24/23 Potential to Achieve Goals: Good    Frequency Min 2X/week     Co-evaluation PT/OT/SLP Co-Evaluation/Treatment: Yes Reason for Co-Treatment: To address functional/ADL transfers;For patient/therapist safety PT  goals addressed during session: Mobility/safety with mobility;Balance;Proper use of DME OT goals addressed during session: ADL's and self-care       AM-PAC PT 6 Clicks Mobility  Outcome Measure Help needed turning from your back to your side while in a flat bed without using bedrails?: None Help needed moving from lying on your back to sitting on the side of a flat bed without using bedrails?: A Little Help needed moving to and from a bed to a chair (including a wheelchair)?: A Little Help needed standing up from a chair using your arms (e.g., wheelchair or bedside chair)?: A Lot Help needed to walk in hospital room?: A Little Help needed climbing 3-5 steps with a railing? : Total 6 Click Score: 16    End of Session   Activity Tolerance: Patient limited by fatigue Patient left: in bed;with call bell/phone within reach;with bed alarm set Nurse Communication: Mobility status PT Visit Diagnosis: Muscle weakness (generalized) (M62.81);Unsteadiness on feet (R26.81)    Time: 9178-9148 PT Time Calculation (min) (ACUTE ONLY): 30 min   Charges:   PT Evaluation $PT Eval Low Complexity: 1 Low PT Treatments $Therapeutic Activity: 8-22 mins PT General Charges $$ ACUTE PT VISIT: 1 Visit         Randine Essex, PT, MPT  Randine LULLA Essex 11/10/2023, 12:16 PM

## 2023-11-10 NOTE — Progress Notes (Addendum)
 PROGRESS NOTE    [name]   FMW:[id number] DOB: November 29, 1945  DOA: 11/09/2023 Date of Service: 11/10/23 which is hospital day 0  PCP: Leavy Mole, [facility] course / significant events:   HPI: Colleen [name] is a 78 y.o. female with medical history significant of metastasized stage IV pancreatic cancer on chemotherapy, HTN, HLD, prediabetes, COPD/asthma, gout, GERD, CKD-3, anemia, A-fib and PE not on Eliquis , lumbar spinal stenosis, who presents with weakness worsening over 1 week. She has poor appetite and decreased oral intake with generalized weakness. Pt states that she was trying to get to the kitchen to make something to eat however she was feeling very weak and lowered herself down to the ground. No fall. Of note, was recently hospitalized from 10/2 - 10/10 due to SIRS due to worsening anemia.  Hemoglobin down to 6.8, received 1 unit of blood transfusion.  Eliquis  was discontinued as recommended by cardiologist.   10/15: admitted for Afib RVR, HR improved w/o intervention.  10/16: still weak, PT/OT recs for SNF      Consultants:  none  Procedures/Surgeries: none      ASSESSMENT & PLAN:   Atrial fibrillation with RVR (RVR resolved):  Heart rate up to 142 by EKG observation amiodarone  200 mg daily metoprolol  2.5 mg every 2 hour for heart rate> 125 Replete potassium and give magnesium  sulfate as below   Myocardial injury:  Troponin 105 --> 121, no chest pain, likely due to demand ischemia.  Patient has chronically elevated troponin 40-50s. Trend troponin - remains flat and no chest pain can dc recheck  pt states that she can not tolerate ASA due to stomach issue Check A1c, FLP Start Lipitor 10 mg daily   Hypokalemia and hypophosphatemia:  Repleted potassium and phosphorus Also give 2 g magnesium  sulfate due to A-fib with RVR   Primary pancreatic cancer with metastasis to other site  Last chemo was on Tuesday Follow-up with Dr. Cora   Benign  essential HTN:  Blood pressure soft.  Patient is not taking blood pressure medications currently other than Afib meds as above . Observe closely   HLD (hyperlipidemia) started Lipitor 10 mg daily due to myocardial injury   COPD (chronic obstructive pulmonary disease) (HCC): Stable Bronchodilators as needed Mucinex    Gout: Allopurinol    Chronic kidney disease, stage 3a (HCC): Stable, GFR> 60 Follow-up with BMP   Generalized weakness: Likely multifactoral etiology Fall precaution PT/OT   Normocytic anemia CBC    N/a based on BMI: There is no height or weight on file to calculate BMI.. Significantly low or high BMI is associated with higher medical risk.  Underweight - under 18  overweight - 25 to 29 obese - 30 or more Class 1 obesity: BMI of 30.0 to 34 Class 2 obesity: BMI of 35.0 to 39 Class 3 obesity: BMI of 40.0 to 49 Super Morbid Obesity: BMI 50-59 Super-super Morbid Obesity: BMI 60+ Healthy nutrition and physical activity advised as adjunct to other disease management and risk reduction treatments    DVT prophylaxis: SCD IV fluids: no continuous IV fluids  Nutrition: cardiac Central lines / other devices: none  Code Status: FULL CODE per patient per H&P ACP documentation reviewed:  none on file in [facility]  [facility] needs: SNF rehab Medical barriers to dispo: monitoring tele. Expected medical readiness for discharge tomorrow.              Subjective / Brief ROS:  Patient reports feeling a bit better  today but still very weakn, can't walk by herself Denies CP/SOB.  Pain controlled.  Denies new weakness.  Tolerating diet.  Reports no concerns w/ urination/defecation.   Family Communication: none at this time    Objective Findings:  Vitals:   11/10/23 0200 11/10/23 0530 11/10/23 0551 11/10/23 1020  BP:  101/60 101/60 (!) 75/53  Pulse:  71 82 95  Resp:  16 14 19   Temp: 98.2 F (36.8 C)  98.4 F (36.9 C) 98.1 F (36.7 C)  TempSrc: Oral  Oral  Oral  SpO2:  100% 100% 100%    Intake/Output Summary (Last 24 hours) at 11/10/2023 1706 Last data filed at 11/10/2023 0707 Gross per 24 hour  Intake 235.91 ml  Output --  Net 235.91 ml   There were no vitals filed for this visit.  Examination:  Physical Exam Constitutional:      General: She is not in acute distress. Cardiovascular:     Rate and Rhythm: Normal rate and regular rhythm.  Pulmonary:     Effort: Pulmonary effort is normal.     Breath sounds: Normal breath sounds.  Neurological:     Mental Status: She is alert and oriented to person, place, and time. Mental status is at baseline.  Psychiatric:        Mood and Affect: Mood normal.        Behavior: Behavior normal.          Scheduled Medications:   allopurinol   200 mg Oral Daily   amiodarone   200 mg Oral Daily   atorvastatin  10 mg Oral Daily   budesonide -glycopyrrolate -formoterol   2 puff Inhalation BID   cholecalciferol  1,000 Units Oral Daily   megestrol   20 mg Oral BID   multivitamin with minerals  1 tablet Oral Daily    Continuous Infusions:   PRN Medications:  acetaminophen , albuterol , dextromethorphan-guaiFENesin , docusate sodium, metoprolol  tartrate, ondansetron  (ZOFRAN ) IV, polyethylene glycol, simethicone   Antimicrobials from admission:  Anti-infectives (From admission, onward)    None           Data Reviewed:  I have personally reviewed the following...  CBC: Recent Labs  Lab 11/04/23 0555 11/08/23 0811 11/09/23 1617 11/10/23 0117  WBC 3.4* 5.5 8.5 8.0  NEUTROABS  --  3.0  --   --   HGB 7.9* 9.0* 8.9* 9.1*  HCT 23.2* 26.9* 27.3* 29.1*  MCV 91.3 89.7 92.5 95.7  PLT 174 534* 465* 397   Basic Metabolic Panel: Recent Labs  Lab 11/04/23 0555 11/08/23 0811 11/09/23 1617 11/10/23 0117  NA 137 134* 140 140  K 3.6 2.8* 2.6* 3.2*  CL 109 105 107 110  CO2 23 20* 23 18*  GLUCOSE 110* 85 93 124*  BUN 12 16 13 12   CREATININE 0.80 1.07* 0.86 0.85  CALCIUM  8.2* 8.3*  7.5* 7.2*  MG  --   --  1.7 2.6*  PHOS  --   --  2.1* 2.2*   GFR: Estimated Creatinine Clearance: 55 mL/min (by C-G formula based on SCr of 0.85 mg/dL). Liver Function Tests: Recent Labs  Lab 11/08/23 0811 11/09/23 1617  AST 27 41  ALT 14 15  ALKPHOS 93 80  BILITOT 0.9 0.9  PROT 6.0* 5.8*  ALBUMIN 2.5* 2.4*   No results for input(s): LIPASE, AMYLASE in the last 168 hours. No results for input(s): AMMONIA in the last 168 hours. Coagulation Profile: No results for input(s): INR, PROTIME in the last 168 hours. Cardiac Enzymes: No results for input(s): CKTOTAL,  CKMB, CKMBINDEX, TROPONINI in the last 168 hours. BNP (last 3 results) No results for input(s): PROBNP in the last 8760 hours. HbA1C: Recent Labs    11/09/23 1617  HGBA1C 5.7*   CBG: No results for input(s): GLUCAP in the last 168 hours. Lipid Profile: Recent Labs    11/10/23 0117  CHOL 105  HDL 39*  LDLCALC 53  TRIG 65  CHOLHDL 2.7   Thyroid  Function Tests: No results for input(s): TSH, T4TOTAL, FREET4, T3FREE, THYROIDAB in the last 72 hours. Anemia Panel: No results for input(s): VITAMINB12, FOLATE, FERRITIN, TIBC, IRON, RETICCTPCT in the last 72 hours. Most Recent Urinalysis On File:     Component Value Date/Time   COLORURINE STRAW (A) 11/09/2023 1650   APPEARANCEUR CLEAR (A) 11/09/2023 1650   LABSPEC 1.005 11/09/2023 1650   PHURINE 6.0 11/09/2023 1650   GLUCOSEU NEGATIVE 11/09/2023 1650   HGBUR LARGE (A) 11/09/2023 1650   BILIRUBINUR NEGATIVE 11/09/2023 1650   BILIRUBINUR negative 07/26/2022 1046   KETONESUR NEGATIVE 11/09/2023 1650   PROTEINUR NEGATIVE 11/09/2023 1650   UROBILINOGEN 0.2 07/26/2022 1046   NITRITE NEGATIVE 11/09/2023 1650   LEUKOCYTESUR NEGATIVE 11/09/2023 1650   Sepsis Labs: @LABRCNTIP (procalcitonin:4,lacticidven:4) Microbiology: Recent Results (from the past 240 hours)  Resp panel by RT-PCR (RSV, Flu A&B, Covid) Urine, Clean Catch      Status: None   Collection Time: 11/09/23  4:50 PM   Specimen: Urine, Clean Catch; Nasal Swab  Result Value Ref Range Status   SARS Coronavirus 2 by RT PCR NEGATIVE NEGATIVE Final    Comment: (NOTE) SARS-CoV-2 target nucleic acids are NOT DETECTED.  The SARS-CoV-2 RNA is generally detectable in upper respiratory specimens during the acute phase of infection. The lowest concentration of SARS-CoV-2 viral copies this assay can detect is 138 copies/mL. A negative result does not preclude SARS-Cov-2 infection and should not be used as the sole basis for treatment or other patient management decisions. A negative result may occur with  improper specimen collection/handling, submission of specimen other than nasopharyngeal swab, presence of viral mutation(s) within the areas targeted by this assay, and inadequate number of viral copies(<138 copies/mL). A negative result must be combined with clinical observations, patient history, and epidemiological information. The expected result is Negative.  Fact Sheet for Patients:  BloggerCourse.com  Fact Sheet for Healthcare Providers:  SeriousBroker.it  This test is no t yet approved or cleared by the [place]  FDA and  has been authorized for detection and/or diagnosis of SARS-CoV-2 by FDA under an Emergency Use Authorization (EUA). This EUA will remain  in effect (meaning this test can be used) for the duration of the COVID-19 declaration under Section 564(b)(1) of the Act, 21 U.S.C.section 360bbb-3(b)(1), unless the authorization is terminated  or revoked sooner.       Influenza A by PCR NEGATIVE NEGATIVE Final   Influenza B by PCR NEGATIVE NEGATIVE Final    Comment: (NOTE) The Xpert Xpress SARS-CoV-2/FLU/RSV plus assay is intended as an aid in the diagnosis of influenza from Nasopharyngeal swab specimens and should not be used as a sole basis for treatment. Nasal washings  and aspirates are unacceptable for Xpert Xpress SARS-CoV-2/FLU/RSV testing.  Fact Sheet for Patients: BloggerCourse.com  Fact Sheet for Healthcare Providers: SeriousBroker.it  This test is not yet approved or cleared by the [place]  FDA and has been authorized for detection and/or diagnosis of SARS-CoV-2 by FDA under an Emergency Use Authorization (EUA). This EUA will remain in effect (meaning this test can be used) for  the duration of the COVID-19 declaration under Section 564(b)(1) of the Act, 21 U.S.C. section 360bbb-3(b)(1), unless the authorization is terminated or revoked.     Resp Syncytial Virus by PCR NEGATIVE NEGATIVE Final    Comment: (NOTE) Fact Sheet for Patients: BloggerCourse.com  Fact Sheet for Healthcare Providers: SeriousBroker.it  This test is not yet approved or cleared by the [place]  FDA and has been authorized for detection and/or diagnosis of SARS-CoV-2 by FDA under an Emergency Use Authorization (EUA). This EUA will remain in effect (meaning this test can be used) for the duration of the COVID-19 declaration under Section 564(b)(1) of the Act, 21 U.S.C. section 360bbb-3(b)(1), unless the authorization is terminated or revoked.  Performed at [facility], [address], [place], [place] [address]       Radiology Studies last 3 days: DG Chest Portable 1 View Result Date: 11/09/2023 CLINICAL DATA:  weakness EXAM: PORTABLE CHEST - 1 VIEW COMPARISON:  10/27/2023 FINDINGS: Right chest port is similarly positioned terminating at the cavoatrial junction. No focal airspace consolidation, pleural effusion, or pneumothorax. No cardiomegaly. Aortic atherosclerosis. No acute fracture or destructive lesions. Multilevel thoracic osteophytosis. Surgical clips in the left upper quadrant. IMPRESSION: No acute cardiopulmonary abnormality.  Electronically Signed   By: Rogelia Myers M.D.   On: 11/09/2023 16:59          Jannely Henthorn, DO [facility] 11/10/2023, 5:06 PM    Dictation software may have been used to generate the above note. Typos may occur and escape review in typed/dictated notes. Please contact Dr Marsa directly for clarity if needed.  Staff may message me via secure chat in Epic  but this may not receive an immediate response,  please page me for urgent matters!  If 7PM-7AM, please contact night coverage [email or web address]

## 2023-11-10 NOTE — Evaluation (Signed)
 Occupational Therapy Evaluation Patient Details Name: Colleen [name] MRN: [id number] DOB: 05-11-1945 Today's Date: 11/10/2023   History of Present Illness   78 y.o. female with medical history significant of metastasized stage IV pancreatic cancer on chemotherapy, HTN, HLD, prediabetes, COPD/asthma, gout, GERD, CKD-3, anemia, A-fib and PE not on Eliquis , lumbar spinal stenosis, who presents with weakness. Patient was recently hospitalized from 10/2 - 10/10 due to SIRS due to worsening anemia.     Clinical Impressions Pt was seen for OT evaluation and co-tx with PT this date. Prior to hospital admission, pt was living alone and had assist from her niece every morning and for driving. Pt has had a few recent hospitalizations and has been getting progressively weaker. She is eager to get stronger and return home. Pt presents with deficits in strength, activity tolerance, awareness of deficits, and decr balance, affecting safe and optimal ADL completion. Pt currently requires initial MOD A +2 to stand from EOB, improving to MIN A +1 from elevated commode, CGA for short distance mobility to bathroom, MIN A for clothing mgt after toileting, MAX A for donning socks, and endorses fatigue quickly, requiring use of transport chair to return safely to her room. Pt would benefit from skilled OT services to address noted impairments and functional limitations (see below for any additional details) in order to maximize safety and independence while minimizing future risk of falls, injury, and readmission. Anticipate the need for follow up OT services upon acute hospital DC.    If plan is discharge home, recommend the following:   A lot of help with walking and/or transfers;A lot of help with bathing/dressing/bathroom;Assistance with cooking/housework;Assist for transportation;Help with stairs or ramp for entrance     Functional Status Assessment   Patient has had a recent decline in their functional status  and demonstrates the ability to make significant improvements in function in a reasonable and predictable amount of time.     Equipment Recommendations   Other (comment) (defer)     Recommendations for Other Services         Precautions/Restrictions   Precautions Precautions: Fall Recall of Precautions/Restrictions: Impaired Restrictions Weight Bearing Restrictions Per Provider Order: No     Mobility Bed Mobility Overal bed mobility: Needs Assistance Bed Mobility: Supine to Sit, Sit to Supine     Supine to sit: Supervision Sit to supine: Supervision   General bed mobility comments: slow, effortful    Transfers Overall transfer level: Needs assistance Equipment used: Rolling walker (2 wheels) Transfers: Sit to/from Stand Sit to Stand: Min assist, Mod assist, +2 physical assistance, +2 safety/equipment           General transfer comment: initial MOD A +2 from EOB, improving to MIN A +1 from elevated toilet wiht use of grab bar      Balance Overall balance assessment: Needs assistance Sitting-balance support: Feet supported, No upper extremity supported Sitting balance-Leahy Scale: Fair     Standing balance support: Bilateral upper extremity supported, Single extremity supported, During functional activity, Reliant on assistive device for balance Standing balance-Leahy Scale: Fair Standing balance comment: requires at least 1 UE on RW for stability, able to briefly let go but unsteady while donning brief over hips                           ADL either performed or assessed with clinical judgement   ADL Overall ADL's : Needs assistance/impaired     Grooming: Sitting;Set up  Lower Body Dressing: Sit to/from stand;Sitting/lateral leans;Maximal assistance;Minimal assistance Lower Body Dressing Details (indicate cue type and reason): MAX A for donning socks EOB, able to doff/don brief with MIN A Toilet Transfer: Contact guard  assist;Comfort height toilet;Ambulation;Rolling walker (2 wheels);Grab bars;+2 for safety/equipment;Minimal assistance Toilet Transfer Details (indicate cue type and reason): CGA to sit, MIN A to come back up to stand Toileting- Clothing Manipulation and Hygiene: Sitting/lateral lean;Sit to/from stand;Minimal assistance;Set up Toileting - Clothing Manipulation Details (indicate cue type and reason): set up for seated pericare, STS with MIN A for clothing mgt     Functional mobility during ADLs: Contact guard assist;Cueing for safety;Rolling walker (2 wheels);+2 for safety/equipment       Vision         Perception         Praxis         Pertinent Vitals/Pain Pain Assessment Pain Assessment: No/denies pain     Extremity/Trunk Assessment Upper Extremity Assessment Upper Extremity Assessment: Generalized weakness   Lower Extremity Assessment Lower Extremity Assessment: Generalized weakness       Communication Communication Communication: No apparent difficulties   Cognition Arousal: Alert Behavior During Therapy: WFL for tasks assessed/performed Cognition: No family/caregiver present to determine baseline             OT - Cognition Comments: poor insight into deficits and activity tolerance                 Following commands: Intact       Cueing  General Comments   Cueing Techniques: Verbal cues  HR up to 120's with exertion   Exercises Other Exercises Other Exercises: Tolerated only ~40' to the bathroom but then unable to walk back 2/2 fatigue.   Shoulder Instructions      Home Living Family/patient expects to be discharged to:: Private residence Living Arrangements: Alone Available Help at Discharge: Family;Available PRN/intermittently Type of Home: House Home Access: Stairs to enter [employer] of Steps: 4 Entrance Stairs-Rails: Right Home Layout: One level     Bathroom Shower/Tub: Tub/shower unit;Sponge bathes at baseline          Home Equipment: Agricultural consultant (2 wheels);Cane - single point;Tub bench   Additional Comments: info pulled from very recent hospital admission      Prior Functioning/Environment Prior Level of Function : Independent/Modified Independent             Mobility Comments: uses SPC for mobility, reports she has been using walker more since last admission ADLs Comments: Pt is Ind in self care and IADLs. She no longer drives but niece drives to appts and get groceries.    OT Problem List: Decreased strength;Decreased activity tolerance;Decreased safety awareness;Impaired balance (sitting and/or standing);Decreased knowledge of use of DME or AE   OT Treatment/Interventions: Self-care/ADL training;Therapeutic exercise;Therapeutic activities;Energy conservation;DME and/or AE instruction;Patient/family education;Balance training      OT Goals(Current goals can be found in the care plan section)   Acute Rehab OT Goals Patient Stated Goal: get better OT Goal Formulation: With patient Time For Goal Achievement: 11/24/23 Potential to Achieve Goals: Good ADL Goals Pt Will Perform Upper Body Dressing: sitting;with modified independence Pt Will Perform Lower Body Dressing: sit to/from stand;with supervision Pt Will Transfer to Toilet: ambulating;with supervision (LRAD) Pt Will Perform Toileting - Clothing Manipulation and hygiene: with modified independence;sitting/lateral leans Additional ADL Goal #1: Pt will verbalize plan to implement at least 1 learned falls prevention strategy.   OT Frequency:  Min 2X/week  Co-evaluation PT/OT/SLP Co-Evaluation/Treatment: Yes Reason for Co-Treatment: To address functional/ADL transfers;For patient/therapist safety PT goals addressed during session: Mobility/safety with mobility;Balance;Proper use of DME OT goals addressed during session: ADL's and self-care      AM-PAC OT 6 Clicks Daily Activity     Outcome Measure Help from another  person eating meals?: None Help from another person taking care of personal grooming?: A Little Help from another person toileting, which includes using toliet, bedpan, or urinal?: A Little Help from another person bathing (including washing, rinsing, drying)?: A Little Help from another person to put on and taking off regular upper body clothing?: A Little Help from another person to put on and taking off regular lower body clothing?: A Lot 6 Click Score: 18   End of Session Equipment Utilized During Treatment: Rolling walker (2 wheels) Nurse Communication: Mobility status;Other (comment) (blood during pericare)  Activity Tolerance: Patient tolerated treatment well Patient left: in bed;with call bell/phone within reach;with bed alarm set  OT Visit Diagnosis: Unsteadiness on feet (R26.81);History of falling (Z91.81);Muscle weakness (generalized) (M62.81)                Time: 9178-9149 OT Time Calculation (min): 29 min Charges:  OT General Charges $OT Visit: 1 Visit OT Evaluation $OT Eval Low Complexity: 1 Low OT Treatments $Self Care/Home Management : 8-22 mins  Warren SAUNDERS., MPH, MS, OTR/L ascom [phone] 11/10/23, 9:26 AM

## 2023-11-11 ENCOUNTER — Encounter: Payer: Self-pay | Admitting: Osteopathic Medicine

## 2023-11-11 ENCOUNTER — Other Ambulatory Visit (HOSPITAL_COMMUNITY): Payer: Self-pay

## 2023-11-11 ENCOUNTER — Other Ambulatory Visit: Payer: Self-pay

## 2023-11-11 DIAGNOSIS — I4891 Unspecified atrial fibrillation: Secondary | ICD-10-CM | POA: Diagnosis present

## 2023-11-11 LAB — CBC
HCT: 25.4 % — ABNORMAL LOW (ref 36.0–46.0)
Hemoglobin: 8.2 g/dL — ABNORMAL LOW (ref 12.0–15.0)
MCH: 30 pg (ref 26.0–34.0)
MCHC: 32.3 g/dL (ref 30.0–36.0)
MCV: 93 fL (ref 80.0–100.0)
Platelets: 345 K/uL (ref 150–400)
RBC: 2.73 MIL/uL — ABNORMAL LOW (ref 3.87–5.11)
RDW: 18.1 % — ABNORMAL HIGH (ref 11.5–15.5)
WBC: 11.2 K/uL — ABNORMAL HIGH (ref 4.0–10.5)
nRBC: 0 % (ref 0.0–0.2)

## 2023-11-11 LAB — BASIC METABOLIC PANEL WITH GFR
Anion gap: 7 (ref 5–15)
BUN: 10 mg/dL (ref 8–23)
CO2: 23 mmol/L (ref 22–32)
Calcium: 7.4 mg/dL — ABNORMAL LOW (ref 8.9–10.3)
Chloride: 110 mmol/L (ref 98–111)
Creatinine, Ser: 0.61 mg/dL (ref 0.44–1.00)
GFR, Estimated: >60 mL/min (ref >60)
Glucose, Bld: 90 mg/dL (ref 70–99)
Potassium: 3.1 mmol/L — ABNORMAL LOW (ref 3.5–5.1)
Sodium: 140 mmol/L (ref 135–145)

## 2023-11-11 LAB — MAGNESIUM: Magnesium: 1.9 mg/dL (ref 1.7–2.4)

## 2023-11-11 MED ORDER — POTASSIUM CHLORIDE 20 MEQ PO PACK
40.0000 meq | PACK | Freq: Two times a day (BID) | ORAL | Status: AC
  Administered 2023-11-11 (×2): 40 meq via ORAL
  Filled 2023-11-11 (×2): qty 2

## 2023-11-11 MED ORDER — MAGNESIUM CHLORIDE 64 MG PO TBEC
1.0000 | DELAYED_RELEASE_TABLET | Freq: Every day | ORAL | Status: DC
  Administered 2023-11-11 – 2023-12-01 (×21): 64 mg via ORAL
  Filled 2023-11-11 (×21): qty 1

## 2023-11-11 MED ORDER — ENOXAPARIN SODIUM 40 MG/0.4ML IJ SOSY
40.0000 mg | PREFILLED_SYRINGE | Freq: Every day | INTRAMUSCULAR | Status: DC
  Administered 2023-11-11 – 2023-11-30 (×20): 40 mg via SUBCUTANEOUS
  Filled 2023-11-11 (×20): qty 0.4

## 2023-11-11 NOTE — ED Notes (Signed)
 This RN checked patient's brief to make sure patient did not have a bowel incontinent episode. Patient's brief was clean and dry at this time. Patient denies other needs.

## 2023-11-11 NOTE — Progress Notes (Signed)
 PROGRESS NOTE    Colleen [name]   FMW:[id number] DOB: [birth year]  DOA: 11/09/2023 Date of Service: 11/11/23 which is hospital day 1  PCP: Leavy Mole, [facility] course / significant events:   Colleen [name] is a 78 y.o. female with medical history significant of metastasized stage IV pancreatic cancer on chemotherapy, HTN, HLD, prediabetes, COPD/asthma, gout, GERD, CKD-3, anemia, A-fib and PE not on Eliquis , lumbar spinal stenosis, who presents with weakness   HPI:  weakness worsening over 1 week. She has poor appetite and decreased oral intake with generalized weakness. Pt states that she was trying to get to the kitchen to make something to eat however she was feeling very weak and lowered herself down to the ground. No fall. Of note, was recently hospitalized from 10/2 - 10/10 due to SIRS due to worsening anemia.  Hemoglobin down to 6.8, received 1 unit of blood transfusion.  Eliquis  was discontinued as recommended by cardiologist.   10/15: admitted for Afib RVR, HR improved w/o intervention.  10/16: still weak, PT/OT recs for SNF 10/17: awaiting placement      Consultants:  none  Procedures/Surgeries: none      ASSESSMENT & PLAN:   Atrial fibrillation with RVR (RVR resolved):  Heart rate up to 142 by EKG Continue home amiodarone  200 mg daily Monitor Mg and K   Myocardial injury:  Troponin 105 --> 121, no chest pain, likely due to demand ischemia.  Patient has chronically elevated troponin 40-50s. Trend troponin - remains flat and no chest pain can dc recheck  pt states that she can not tolerate ASA due to stomach issue Lipitor 10 mg daily   Hypokalemia and hypophosphatemia:  Replace as needed Monitor levels    Primary pancreatic cancer with metastasis to other site  Last chemo was on Tuesday Follow-up with Dr. Jacobo   Benign essential HTN:  Blood pressure soft.  Patient is not taking blood pressure medications currently other than Afib meds as  above . Observe closely   HLD (hyperlipidemia) started Lipitor 10 mg daily due to myocardial injury   COPD (chronic obstructive pulmonary disease) (HCC): Stable Bronchodilators as needed Mucinex    Gout: Allopurinol    Chronic kidney disease, stage 3a (HCC): Stable, GFR> 60 Follow-up with BMP   Generalized weakness: Likely multifactoral etiology Fall precaution PT/OT   Normocytic anemia CBC    N/a based on BMI: There is no height or weight on file to calculate BMI.. Significantly low or high BMI is associated with higher medical risk.  Underweight - under 18  overweight - 25 to 29 obese - 30 or more Class 1 obesity: BMI of 30.0 to 34 Class 2 obesity: BMI of 35.0 to 39 Class 3 obesity: BMI of 40.0 to 49 Super Morbid Obesity: BMI 50-59 Super-super Morbid Obesity: BMI 60+ Healthy nutrition and physical activity advised as adjunct to other disease management and risk reduction treatments    DVT prophylaxis: SCD IV fluids: no continuous IV fluids  Nutrition: cardiac Central lines / other devices: none  Code Status: FULL CODE per patient per H&P ACP documentation reviewed:  none on file in [facility]  [facility] needs: SNF rehab Medical barriers to dispo: monitoring tele. Expected medical readiness for discharge tomorrow.              Subjective / Brief ROS:  Patient reports feeling okay today Upset that we keep waking her up to ask her questions  Denies CP/SOB.  Pain controlled.  Denies new  weakness.  Tolerating diet.    Family Communication: none at this time    Objective Findings:  Vitals:   11/11/23 0900 11/11/23 0941 11/11/23 1250 11/11/23 1409  BP: 107/62 107/62 107/78   Pulse:  94 97   Resp: 16 16 17    Temp:  98 F (36.7 C)  98.3 F (36.8 C)  TempSrc:  Oral  Oral  SpO2: 100% 100% 99%    No intake or output data in the 24 hours ending 11/11/23 1540  There were no vitals filed for this visit.  Examination:  Physical Exam Constitutional:       General: She is not in acute distress. Cardiovascular:     Rate and Rhythm: Normal rate and regular rhythm.  Pulmonary:     Effort: Pulmonary effort is normal.     Breath sounds: Normal breath sounds.  Neurological:     Mental Status: She is alert and oriented to person, place, and time. Mental status is at baseline.  Psychiatric:        Mood and Affect: Mood normal.        Behavior: Behavior normal.          Scheduled Medications:   allopurinol   200 mg Oral Daily   amiodarone   200 mg Oral Daily   atorvastatin  10 mg Oral Daily   budesonide -glycopyrrolate -formoterol   2 puff Inhalation BID   cholecalciferol  1,000 Units Oral Daily   enoxaparin  (LOVENOX ) injection  40 mg Subcutaneous QHS   magnesium  chloride  1 tablet Oral Daily   megestrol   20 mg Oral BID   multivitamin with minerals  1 tablet Oral Daily   potassium chloride   40 mEq Oral BID    Continuous Infusions:   PRN Medications:  acetaminophen , albuterol , dextromethorphan-guaiFENesin , docusate sodium, metoprolol  tartrate, ondansetron  (ZOFRAN ) IV, polyethylene glycol, simethicone   Antimicrobials from admission:  Anti-infectives (From admission, onward)    None           Data Reviewed:  I have personally reviewed the following...  CBC: Recent Labs  Lab 11/08/23 0811 11/09/23 1617 11/10/23 0117 11/11/23 0401  WBC 5.5 8.5 8.0 11.2*  NEUTROABS 3.0  --   --   --   HGB 9.0* 8.9* 9.1* 8.2*  HCT 26.9* 27.3* 29.1* 25.4*  MCV 89.7 92.5 95.7 93.0  PLT 534* 465* 397 345   Basic Metabolic Panel: Recent Labs  Lab 11/08/23 0811 11/09/23 1617 11/10/23 0117 11/11/23 0401  NA 134* 140 140 140  K 2.8* 2.6* 3.2* 3.1*  CL 105 107 110 110  CO2 20* 23 18* 23  GLUCOSE 85 93 124* 90  BUN 16 13 12 10   CREATININE 1.07* 0.86 0.85 0.61  CALCIUM  8.3* 7.5* 7.2* 7.4*  MG  --  1.7 2.6* 1.9  PHOS  --  2.1* 2.2*  --    GFR: Estimated Creatinine Clearance: 58.4 mL/min (by C-G formula based on SCr of 0.61  mg/dL). Liver Function Tests: Recent Labs  Lab 11/08/23 0811 11/09/23 1617  AST 27 41  ALT 14 15  ALKPHOS 93 80  BILITOT 0.9 0.9  PROT 6.0* 5.8*  ALBUMIN 2.5* 2.4*   No results for input(s): LIPASE, AMYLASE in the last 168 hours. No results for input(s): AMMONIA in the last 168 hours. Coagulation Profile: No results for input(s): INR, PROTIME in the last 168 hours. Cardiac Enzymes: No results for input(s): CKTOTAL, CKMB, CKMBINDEX, TROPONINI in the last 168 hours. BNP (last 3 results) No results for input(s): PROBNP in the  last 8760 hours. HbA1C: Recent Labs    11/09/23 1617  HGBA1C 5.7*   CBG: No results for input(s): GLUCAP in the last 168 hours. Lipid Profile: Recent Labs    11/10/23 0117  CHOL 105  HDL 39*  LDLCALC 53  TRIG 65  CHOLHDL 2.7   Thyroid  Function Tests: No results for input(s): TSH, T4TOTAL, FREET4, T3FREE, THYROIDAB in the last 72 hours. Anemia Panel: No results for input(s): VITAMINB12, FOLATE, FERRITIN, TIBC, IRON, RETICCTPCT in the last 72 hours. Most Recent Urinalysis On File:     Component Value Date/Time   COLORURINE STRAW (A) 11/09/2023 1650   APPEARANCEUR CLEAR (A) 11/09/2023 1650   LABSPEC 1.005 11/09/2023 1650   PHURINE 6.0 11/09/2023 1650   GLUCOSEU NEGATIVE 11/09/2023 1650   HGBUR LARGE (A) 11/09/2023 1650   BILIRUBINUR NEGATIVE 11/09/2023 1650   BILIRUBINUR negative 07/26/2022 1046   KETONESUR NEGATIVE 11/09/2023 1650   PROTEINUR NEGATIVE 11/09/2023 1650   UROBILINOGEN 0.2 07/26/2022 1046   NITRITE NEGATIVE 11/09/2023 1650   LEUKOCYTESUR NEGATIVE 11/09/2023 1650   Sepsis Labs: @LABRCNTIP (procalcitonin:4,lacticidven:4) Microbiology: Recent Results (from the past 240 hours)  Resp panel by RT-PCR (RSV, Flu A&B, Covid) Urine, Clean Catch     Status: None   Collection Time: 11/09/23  4:50 PM   Specimen: Urine, Clean Catch; Nasal Swab  Result Value Ref Range Status   SARS  Coronavirus 2 by RT PCR NEGATIVE NEGATIVE Final    Comment: (NOTE) SARS-CoV-2 target nucleic acids are NOT DETECTED.  The SARS-CoV-2 RNA is generally detectable in upper respiratory specimens during the acute phase of infection. The lowest concentration of SARS-CoV-2 viral copies this assay can detect is 138 copies/mL. A negative result does not preclude SARS-Cov-2 infection and should not be used as the sole basis for treatment or other patient management decisions. A negative result may occur with  improper specimen collection/handling, submission of specimen other than nasopharyngeal swab, presence of viral mutation(s) within the areas targeted by this assay, and inadequate number of viral copies(<138 copies/mL). A negative result must be combined with clinical observations, patient history, and epidemiological information. The expected result is Negative.  Fact Sheet for Patients:  BloggerCourse.com  Fact Sheet for Healthcare Providers:  SeriousBroker.it  This test is no t yet approved or cleared by the [place]  FDA and  has been authorized for detection and/or diagnosis of SARS-CoV-2 by FDA under an Emergency Use Authorization (EUA). This EUA will remain  in effect (meaning this test can be used) for the duration of the COVID-19 declaration under Section 564(b)(1) of the Act, 21 U.S.C.section 360bbb-3(b)(1), unless the authorization is terminated  or revoked sooner.       Influenza A by PCR NEGATIVE NEGATIVE Final   Influenza B by PCR NEGATIVE NEGATIVE Final    Comment: (NOTE) The Xpert Xpress SARS-CoV-2/FLU/RSV plus assay is intended as an aid in the diagnosis of influenza from Nasopharyngeal swab specimens and should not be used as a sole basis for treatment. Nasal washings and aspirates are unacceptable for Xpert Xpress SARS-CoV-2/FLU/RSV testing.  Fact Sheet for  Patients: BloggerCourse.com  Fact Sheet for Healthcare Providers: SeriousBroker.it  This test is not yet approved or cleared by the [place]  FDA and has been authorized for detection and/or diagnosis of SARS-CoV-2 by FDA under an Emergency Use Authorization (EUA). This EUA will remain in effect (meaning this test can be used) for the duration of the COVID-19 declaration under Section 564(b)(1) of the Act, 21 U.S.C. section 360bbb-3(b)(1), unless the authorization  is terminated or revoked.     Resp Syncytial Virus by PCR NEGATIVE NEGATIVE Final    Comment: (NOTE) Fact Sheet for Patients: BloggerCourse.com  Fact Sheet for Healthcare Providers: SeriousBroker.it  This test is not yet approved or cleared by the [place]  FDA and has been authorized for detection and/or diagnosis of SARS-CoV-2 by FDA under an Emergency Use Authorization (EUA). This EUA will remain in effect (meaning this test can be used) for the duration of the COVID-19 declaration under Section 564(b)(1) of the Act, 21 U.S.C. section 360bbb-3(b)(1), unless the authorization is terminated or revoked.  Performed at [facility], [address], [place], [place] [address]       Radiology Studies last 3 days: DG Chest Portable 1 View Result Date: 11/09/2023 CLINICAL DATA:  weakness EXAM: PORTABLE CHEST - 1 VIEW COMPARISON:  10/27/2023 FINDINGS: Right chest port is similarly positioned terminating at the cavoatrial junction. No focal airspace consolidation, pleural effusion, or pneumothorax. No cardiomegaly. Aortic atherosclerosis. No acute fracture or destructive lesions. Multilevel thoracic osteophytosis. Surgical clips in the left upper quadrant. IMPRESSION: No acute cardiopulmonary abnormality. Electronically Signed   By: Rogelia Myers M.D.   On: 11/09/2023 16:59          Laneta Blunt, DO [facility] 11/11/2023, 3:40 PM    Dictation software may have been used to generate the above note. Typos may occur and escape review in typed/dictated notes. Please contact Dr Blunt directly for clarity if needed.  Staff may message me via secure chat in Epic  but this may not receive an immediate response,  please page me for urgent matters!  If 7PM-7AM, please contact night coverage [email or web address]

## 2023-11-12 DIAGNOSIS — I4891 Unspecified atrial fibrillation: Secondary | ICD-10-CM | POA: Diagnosis not present

## 2023-11-12 LAB — CBC
HCT: 24.5 % — ABNORMAL LOW (ref 36.0–46.0)
Hemoglobin: 8.1 g/dL — ABNORMAL LOW (ref 12.0–15.0)
MCH: 30.3 pg (ref 26.0–34.0)
MCHC: 33.1 g/dL (ref 30.0–36.0)
MCV: 91.8 fL (ref 80.0–100.0)
Platelets: 340 K/uL (ref 150–400)
RBC: 2.67 MIL/uL — ABNORMAL LOW (ref 3.87–5.11)
RDW: 18.4 % — ABNORMAL HIGH (ref 11.5–15.5)
WBC: 11.5 K/uL — ABNORMAL HIGH (ref 4.0–10.5)
nRBC: 0 % (ref 0.0–0.2)

## 2023-11-12 LAB — BASIC METABOLIC PANEL WITH GFR
Anion gap: 5 (ref 5–15)
BUN: 12 mg/dL (ref 8–23)
CO2: 23 mmol/L (ref 22–32)
Calcium: 7.8 mg/dL — ABNORMAL LOW (ref 8.9–10.3)
Chloride: 112 mmol/L — ABNORMAL HIGH (ref 98–111)
Creatinine, Ser: 0.56 mg/dL (ref 0.44–1.00)
GFR, Estimated: >60 mL/min (ref >60)
Glucose, Bld: 98 mg/dL (ref 70–99)
Potassium: 4.2 mmol/L (ref 3.5–5.1)
Sodium: 140 mmol/L (ref 135–145)

## 2023-11-12 MED ORDER — MIDODRINE HCL 5 MG PO TABS
5.0000 mg | ORAL_TABLET | Freq: Three times a day (TID) | ORAL | Status: DC
  Administered 2023-11-12 – 2023-11-18 (×18): 5 mg via ORAL
  Filled 2023-11-12 (×17): qty 1

## 2023-11-12 NOTE — Care Management Important Message (Signed)
 Important Message  Patient Details  Name: Colleen [name] MRN: [id number] Date of Birth: 12/15/1945   Important Message Given:  Yes - Medicare IM     [name] 11/12/2023, 2:09 PM

## 2023-11-12 NOTE — Progress Notes (Addendum)
 PROGRESS NOTE    Colleen [name]   FMW:[id number] DOB: 1945/10/02  DOA: 11/09/2023 Date of Service: 11/12/23 which is hospital day 2  PCP: Colleen [name], [facility] course / significant events:   Colleen [name] is a 78 y.o. female with medical history significant of metastasized stage IV pancreatic cancer on chemotherapy, HTN, HLD, prediabetes, COPD/asthma, gout, GERD, CKD-3, anemia, A-fib and PE not on Eliquis , lumbar spinal stenosis, who presents with weakness   HPI:  weakness worsening over 1 week. She has poor appetite and decreased oral intake with generalized weakness. Pt states that she was trying to get to the kitchen to make something to eat however she was feeling very weak and lowered herself down to the ground. No fall. Of note, was recently hospitalized from 10/2 - 10/10 due to SIRS due to worsening anemia.  Hemoglobin down to 6.8, received 1 unit of blood transfusion.  Eliquis  was discontinued as recommended by cardiologist.   10/15: admitted for Afib RVR, HR improved w/o intervention.  10/16: still weak, PT/OT recs for SNF, TOC consult placed, see TOC note today.  10/17-10/18: awaiting placement      Consultants:  none  Procedures/Surgeries: none      ASSESSMENT & PLAN:   Atrial fibrillation with RVR (RVR resolved):  Heart rate up to 142 by EKG Continue home amiodarone  200 mg daily Monitor Mg and K   Myocardial injury:  Troponin 105 --> 121, no chest pain, likely due to demand ischemia.  Patient has chronically elevated troponin 40-50s. troponin - remains flat and no chest pain can dc recheck  pt states that she can not tolerate ASA due to stomach issue Lipitor 10 mg daily Recheck as needed    Hypotension Midodrine  If this isn't improving will cosndier reduce amiodarone  but it's controlling rate so hesitate to stop this   Hypokalemia and hypophosphatemia:  Replace as needed Monitor levels    Primary pancreatic cancer with metastasis to other  site  Last chemo was on Tuesday Follow-up with Colleen [name]   Benign essential HTN:  Blood pressure soft.  Patient is not taking blood pressure medications currently other than Afib meds as above . Observe closely   HLD (hyperlipidemia) started Lipitor 10 mg daily due to myocardial injury   COPD (chronic obstructive pulmonary disease) (HCC): Stable Bronchodilators as needed Mucinex    Gout: Allopurinol    Chronic kidney disease, stage 3a (HCC): Stable, GFR> 60 Follow-up with BMP   Generalized weakness: Likely multifactoral etiology Fall precaution PT/OT   Normocytic anemia CBC    N/a based on BMI: There is no height or weight on file to calculate BMI.. Significantly low or high BMI is associated with higher medical risk.  Underweight - under 18  overweight - 25 to 29 obese - 30 or more Class 1 obesity: BMI of 30.0 to 34 Class 2 obesity: BMI of 35.0 to 39 Class 3 obesity: BMI of 40.0 to 49 Super Morbid Obesity: BMI 50-59 Super-super Morbid Obesity: BMI 60+ Healthy nutrition and physical activity advised as adjunct to other disease management and risk reduction treatments    DVT prophylaxis: SCD IV fluids: no continuous IV fluids  Nutrition: cardiac Central lines / other devices: none  Code Status: FULL CODE per patient per H&P ACP documentation reviewed:  none on file in [facility]  [facility] needs: SNF rehab Medical barriers to dispo: none             Subjective / Brief ROS:  Patient  reports feeling okay today No complaints Wants to go back to sleep Denies CP/SOB.     Family Communication: none at this time    Objective Findings:  Vitals:   11/12/23 0044 11/12/23 0546 11/12/23 0751 11/12/23 0800  BP: (!) 95/52 (!) 86/42 (!) 97/52   Pulse: 91 83 92   Resp: 18  16   Temp: 98.7 F (37.1 C)  98.5 F (36.9 C)   TempSrc:      SpO2: 100% 98% 99% 100%  Height:        Intake/Output Summary (Last 24 hours) at 11/12/2023 1334 Last data filed at  11/12/2023 1100 Gross per 24 hour  Intake 240 ml  Output --  Net 240 ml    Filed Weights    Examination:  Physical Exam Constitutional:      General: She is not in acute distress. Cardiovascular:     Rate and Rhythm: Normal rate and regular rhythm.  Pulmonary:     Effort: Pulmonary effort is normal.     Breath sounds: Normal breath sounds.  Neurological:     Mental Status: She is alert and oriented to person, place, and time. Mental status is at baseline.  Psychiatric:        Mood and Affect: Mood normal.        Behavior: Behavior normal.          Scheduled Medications:   allopurinol   200 mg Oral Daily   amiodarone   200 mg Oral Daily   atorvastatin  10 mg Oral Daily   budesonide -glycopyrrolate -formoterol   2 puff Inhalation BID   cholecalciferol  1,000 Units Oral Daily   enoxaparin  (LOVENOX ) injection  40 mg Subcutaneous QHS   magnesium  chloride  1 tablet Oral Daily   megestrol   20 mg Oral BID   midodrine   5 mg Oral TID WC   multivitamin with minerals  1 tablet Oral Daily    Continuous Infusions:   PRN Medications:  acetaminophen , albuterol , dextromethorphan-guaiFENesin , docusate sodium, metoprolol  tartrate, ondansetron  (ZOFRAN ) IV, polyethylene glycol, simethicone   Antimicrobials from admission:  Anti-infectives (From admission, onward)    None           Data Reviewed:  I have personally reviewed the following...  CBC: Recent Labs  Lab 11/08/23 0811 11/09/23 1617 11/10/23 0117 11/11/23 0401 11/12/23 0352  WBC 5.5 8.5 8.0 11.2* 11.5*  NEUTROABS 3.0  --   --   --   --   HGB 9.0* 8.9* 9.1* 8.2* 8.1*  HCT 26.9* 27.3* 29.1* 25.4* 24.5*  MCV 89.7 92.5 95.7 93.0 91.8  PLT 534* 465* 397 345 340   Basic Metabolic Panel: Recent Labs  Lab 11/08/23 0811 11/09/23 1617 11/10/23 0117 11/11/23 0401 11/12/23 0352  NA 134* 140 140 140 140  K 2.8* 2.6* 3.2* 3.1* 4.2  CL 105 107 110 110 112*  CO2 20* 23 18* 23 23  GLUCOSE 85 93 124* 90 98  BUN  16 13 12 10 12   CREATININE 1.07* 0.86 0.85 0.61 0.56  CALCIUM  8.3* 7.5* 7.2* 7.4* 7.8*  MG  --  1.7 2.6* 1.9  --   PHOS  --  2.1* 2.2*  --   --    GFR: Estimated Creatinine Clearance: 58.4 mL/min (by C-G formula based on SCr of 0.56 mg/dL). Liver Function Tests: Recent Labs  Lab 11/08/23 0811 11/09/23 1617  AST 27 41  ALT 14 15  ALKPHOS 93 80  BILITOT 0.9 0.9  PROT 6.0* 5.8*  ALBUMIN 2.5* 2.4*  No results for input(s): LIPASE, AMYLASE in the last 168 hours. No results for input(s): AMMONIA in the last 168 hours. Coagulation Profile: No results for input(s): INR, PROTIME in the last 168 hours. Cardiac Enzymes: No results for input(s): CKTOTAL, CKMB, CKMBINDEX, TROPONINI in the last 168 hours. BNP (last 3 results) No results for input(s): PROBNP in the last 8760 hours. HbA1C: Recent Labs    11/09/23 1617  HGBA1C 5.7*   CBG: No results for input(s): GLUCAP in the last 168 hours. Lipid Profile: Recent Labs    11/10/23 0117  CHOL 105  HDL 39*  LDLCALC 53  TRIG 65  CHOLHDL 2.7   Thyroid  Function Tests: No results for input(s): TSH, T4TOTAL, FREET4, T3FREE, THYROIDAB in the last 72 hours. Anemia Panel: No results for input(s): VITAMINB12, FOLATE, FERRITIN, TIBC, IRON, RETICCTPCT in the last 72 hours. Most Recent Urinalysis On File:     Component Value Date/Time   COLORURINE STRAW (A) 11/09/2023 1650   APPEARANCEUR CLEAR (A) 11/09/2023 1650   LABSPEC 1.005 11/09/2023 1650   PHURINE 6.0 11/09/2023 1650   GLUCOSEU NEGATIVE 11/09/2023 1650   HGBUR LARGE (A) 11/09/2023 1650   BILIRUBINUR NEGATIVE 11/09/2023 1650   BILIRUBINUR negative 07/26/2022 1046   KETONESUR NEGATIVE 11/09/2023 1650   PROTEINUR NEGATIVE 11/09/2023 1650   UROBILINOGEN 0.2 07/26/2022 1046   NITRITE NEGATIVE 11/09/2023 1650   LEUKOCYTESUR NEGATIVE 11/09/2023 1650   Sepsis Labs: @LABRCNTIP (procalcitonin:4,lacticidven:4) Microbiology: Recent Results  (from the past 240 hours)  Resp panel by RT-PCR (RSV, Flu A&B, Covid) Urine, Clean Catch     Status: None   Collection Time: 11/09/23  4:50 PM   Specimen: Urine, Clean Catch; Nasal Swab  Result Value Ref Range Status   SARS Coronavirus 2 by RT PCR NEGATIVE NEGATIVE Final    Comment: (NOTE) SARS-CoV-2 target nucleic acids are NOT DETECTED.  The SARS-CoV-2 RNA is generally detectable in upper respiratory specimens during the acute phase of infection. The lowest concentration of SARS-CoV-2 viral copies this assay can detect is 138 copies/mL. A negative result does not preclude SARS-Cov-2 infection and should not be used as the sole basis for treatment or other patient management decisions. A negative result may occur with  improper specimen collection/handling, submission of specimen other than nasopharyngeal swab, presence of viral mutation(s) within the areas targeted by this assay, and inadequate number of viral copies(<138 copies/mL). A negative result must be combined with clinical observations, patient history, and epidemiological information. The expected result is Negative.  Fact Sheet for Patients:  BloggerCourse.com  Fact Sheet for Healthcare Providers:  SeriousBroker.it  This test is no t yet approved or cleared by the [place]  FDA and  has been authorized for detection and/or diagnosis of SARS-CoV-2 by FDA under an Emergency Use Authorization (EUA). This EUA will remain  in effect (meaning this test can be used) for the duration of the COVID-19 declaration under Section 564(b)(1) of the Act, 21 U.S.C.section 360bbb-3(b)(1), unless the authorization is terminated  or revoked sooner.       Influenza A by PCR NEGATIVE NEGATIVE Final   Influenza B by PCR NEGATIVE NEGATIVE Final    Comment: (NOTE) The Xpert Xpress SARS-CoV-2/FLU/RSV plus assay is intended as an aid in the diagnosis of influenza from Nasopharyngeal  swab specimens and should not be used as a sole basis for treatment. Nasal washings and aspirates are unacceptable for Xpert Xpress SARS-CoV-2/FLU/RSV testing.  Fact Sheet for Patients: BloggerCourse.com  Fact Sheet for Healthcare Providers: SeriousBroker.it  This test is  not yet approved or cleared by the [place]  FDA and has been authorized for detection and/or diagnosis of SARS-CoV-2 by FDA under an Emergency Use Authorization (EUA). This EUA will remain in effect (meaning this test can be used) for the duration of the COVID-19 declaration under Section 564(b)(1) of the Act, 21 U.S.C. section 360bbb-3(b)(1), unless the authorization is terminated or revoked.     Resp Syncytial Virus by PCR NEGATIVE NEGATIVE Final    Comment: (NOTE) Fact Sheet for Patients: BloggerCourse.com  Fact Sheet for Healthcare Providers: SeriousBroker.it  This test is not yet approved or cleared by the [place]  FDA and has been authorized for detection and/or diagnosis of SARS-CoV-2 by FDA under an Emergency Use Authorization (EUA). This EUA will remain in effect (meaning this test can be used) for the duration of the COVID-19 declaration under Section 564(b)(1) of the Act, 21 U.S.C. section 360bbb-3(b)(1), unless the authorization is terminated or revoked.  Performed at [facility], [address], [place], [place] [address]       Radiology Studies last 3 days: DG Chest Portable 1 View Result Date: 11/09/2023 CLINICAL DATA:  weakness EXAM: PORTABLE CHEST - 1 VIEW COMPARISON:  10/27/2023 FINDINGS: Right chest port is similarly positioned terminating at the cavoatrial junction. No focal airspace consolidation, pleural effusion, or pneumothorax. No cardiomegaly. Aortic atherosclerosis. No acute fracture or destructive lesions. Multilevel thoracic osteophytosis. Surgical clips in  the left upper quadrant. IMPRESSION: No acute cardiopulmonary abnormality. Electronically Signed   By: Rogelia Myers M.D.   On: 11/09/2023 16:59          Colleen Sedlack, DO [facility] 11/12/2023, 1:34 PM    Dictation software may have been used to generate the above note. Typos may occur and escape review in typed/dictated notes. Please contact Dr Marsa directly for clarity if needed.  Staff may message me via secure chat in Epic  but this may not receive an immediate response,  please page me for urgent matters!  If 7PM-7AM, please contact night coverage [email or web address]

## 2023-11-13 DIAGNOSIS — I4891 Unspecified atrial fibrillation: Secondary | ICD-10-CM | POA: Diagnosis not present

## 2023-11-13 NOTE — Plan of Care (Signed)

## 2023-11-13 NOTE — Progress Notes (Signed)
 PROGRESS NOTE    Colleen [name]   FMW:[id number] DOB: Aug 07, 1945  DOA: 11/09/2023 Date of Service: 11/13/23 which is hospital day 3  PCP: Leavy Mole, [facility] course / significant events:   Colleen [name] is a 78 y.o. female with medical history significant of metastasized stage IV pancreatic cancer on chemotherapy, HTN, HLD, prediabetes, COPD/asthma, gout, GERD, CKD-3, anemia, A-fib and PE not on Eliquis , lumbar spinal stenosis, who presents with weakness   HPI:  weakness worsening over 1 week. She has poor appetite and decreased oral intake with generalized weakness. Pt states that she was trying to get to the kitchen to make something to eat however she was feeling very weak and lowered herself down to the ground. No fall. Of note, was recently hospitalized from 10/2 - 10/10 due to SIRS due to worsening anemia.  Hemoglobin down to 6.8, received 1 unit of blood transfusion.  Eliquis  was discontinued as recommended by cardiologist.   10/15: admitted for Afib RVR, HR improved w/o intervention.  10/16: still weak, PT/OT recs for SNF, TOC consult placed, see TOC note today.  10/17-10/19: awaiting placement      Consultants:  none  Procedures/Surgeries: none      ASSESSMENT & PLAN:   Atrial fibrillation with RVR (RVR resolved):  Continue home amiodarone  200 mg daily Monitor Mg and K   Myocardial injury:  demand ischemia.   Patient has chronically elevated troponin 40-50s. pt states that she can not tolerate ASA due to stomach issue Lipitor 10 mg daily Recheck as needed    Hypotension Midodrine  If this isn't improving will cosndier reduce amiodarone  but it's controlling rate so hesitate to stop this   Hypokalemia and hypophosphatemia:  Replace as needed Monitor levels    Primary pancreatic cancer with metastasis to other site  Follow-up with Dr. Jacobo   Hypotension:  Blood pressure soft.  Patient is not taking blood pressure medications currently  other than Afib meds as above . Observe closely Midodrine     HLD (hyperlipidemia) started Lipitor 10 mg daily due to myocardial injury   COPD (chronic obstructive pulmonary disease) (HCC): Stable Bronchodilators as needed Mucinex    Gout: Allopurinol    Chronic kidney disease, stage 3a  Stable, GFR> 60 Follow-up with BMP   Generalized weakness: Likely multifactoral etiology Fall precaution PT/OT   Normocytic anemia CBC    N/a based on BMI: There is no height or weight on file to calculate BMI.. Significantly low or high BMI is associated with higher medical risk.  Underweight - under 18  overweight - 25 to 29 obese - 30 or more Class 1 obesity: BMI of 30.0 to 34 Class 2 obesity: BMI of 35.0 to 39 Class 3 obesity: BMI of 40.0 to 49 Super Morbid Obesity: BMI 50-59 Super-super Morbid Obesity: BMI 60+ Healthy nutrition and physical activity advised as adjunct to other disease management and risk reduction treatments    DVT prophylaxis: SCD d/t anemia  IV fluids: no continuous IV fluids  Nutrition: cardiac Central lines / other devices: none  Code Status: FULL CODE per patient per H&P ACP documentation reviewed:  none on file in [facility]  TOC needs: SNF rehab Medical barriers to dispo: none             Subjective / Brief ROS:  Patient reports feeling okay today No complaints Wants to go back to sleep Denies CP/SOB.     Family Communication: none at this time    Objective Findings:  Vitals:   11/12/23 1608 11/12/23 2114 11/13/23 0504 11/13/23 0853  BP: (!) 102/48 (!) 90/52 (!) 97/49 (!) 89/46  Pulse:  100 90 94  Resp:  18 18 18   Temp:  98.5 F (36.9 C) 97.7 F (36.5 C) 97.9 F (36.6 C)  TempSrc:      SpO2:  100% 99% 100%  Height:        Intake/Output Summary (Last 24 hours) at 11/13/2023 1420 Last data filed at 11/13/2023 1300 Gross per 24 hour  Intake 180 ml  Output --  Net 180 ml    Filed Weights    Examination:  Physical  Exam Constitutional:      General: She is not in acute distress. Cardiovascular:     Rate and Rhythm: Normal rate and regular rhythm.  Pulmonary:     Effort: Pulmonary effort is normal.     Breath sounds: Normal breath sounds.  Neurological:     Mental Status: She is alert and oriented to person, place, and time. Mental status is at baseline.  Psychiatric:        Mood and Affect: Mood normal.        Behavior: Behavior normal.          Scheduled Medications:   allopurinol   200 mg Oral Daily   amiodarone   200 mg Oral Daily   atorvastatin  10 mg Oral Daily   budesonide -glycopyrrolate -formoterol   2 puff Inhalation BID   cholecalciferol  1,000 Units Oral Daily   enoxaparin  (LOVENOX ) injection  40 mg Subcutaneous QHS   magnesium  chloride  1 tablet Oral Daily   megestrol   20 mg Oral BID   midodrine   5 mg Oral TID WC   multivitamin with minerals  1 tablet Oral Daily    Continuous Infusions:   PRN Medications:  acetaminophen , albuterol , dextromethorphan-guaiFENesin , docusate sodium, metoprolol  tartrate, ondansetron  (ZOFRAN ) IV, polyethylene glycol, simethicone   Antimicrobials from admission:  Anti-infectives (From admission, onward)    None           Data Reviewed:  I have personally reviewed the following...  CBC: Recent Labs  Lab 11/08/23 0811 11/09/23 1617 11/10/23 0117 11/11/23 0401 11/12/23 0352  WBC 5.5 8.5 8.0 11.2* 11.5*  NEUTROABS 3.0  --   --   --   --   HGB 9.0* 8.9* 9.1* 8.2* 8.1*  HCT 26.9* 27.3* 29.1* 25.4* 24.5*  MCV 89.7 92.5 95.7 93.0 91.8  PLT 534* 465* 397 345 340   Basic Metabolic Panel: Recent Labs  Lab 11/08/23 0811 11/09/23 1617 11/10/23 0117 11/11/23 0401 11/12/23 0352  NA 134* 140 140 140 140  K 2.8* 2.6* 3.2* 3.1* 4.2  CL 105 107 110 110 112*  CO2 20* 23 18* 23 23  GLUCOSE 85 93 124* 90 98  BUN 16 13 12 10 12   CREATININE 1.07* 0.86 0.85 0.61 0.56  CALCIUM  8.3* 7.5* 7.2* 7.4* 7.8*  MG  --  1.7 2.6* 1.9  --   PHOS   --  2.1* 2.2*  --   --    GFR: Estimated Creatinine Clearance: 58.4 mL/min (by C-G formula based on SCr of 0.56 mg/dL). Liver Function Tests: Recent Labs  Lab 11/08/23 0811 11/09/23 1617  AST 27 41  ALT 14 15  ALKPHOS 93 80  BILITOT 0.9 0.9  PROT 6.0* 5.8*  ALBUMIN 2.5* 2.4*   No results for input(s): LIPASE, AMYLASE in the last 168 hours. No results for input(s): AMMONIA in the last 168 hours. Coagulation Profile: No  results for input(s): INR, PROTIME in the last 168 hours. Cardiac Enzymes: No results for input(s): CKTOTAL, CKMB, CKMBINDEX, TROPONINI in the last 168 hours. BNP (last 3 results) No results for input(s): PROBNP in the last 8760 hours. HbA1C: No results for input(s): HGBA1C in the last 72 hours.  CBG: No results for input(s): GLUCAP in the last 168 hours. Lipid Profile: No results for input(s): CHOL, HDL, LDLCALC, TRIG, CHOLHDL, LDLDIRECT in the last 72 hours.  Thyroid  Function Tests: No results for input(s): TSH, T4TOTAL, FREET4, T3FREE, THYROIDAB in the last 72 hours. Anemia Panel: No results for input(s): VITAMINB12, FOLATE, FERRITIN, TIBC, IRON, RETICCTPCT in the last 72 hours. Most Recent Urinalysis On File:     Component Value Date/Time   COLORURINE STRAW (A) 11/09/2023 1650   APPEARANCEUR CLEAR (A) 11/09/2023 1650   LABSPEC 1.005 11/09/2023 1650   PHURINE 6.0 11/09/2023 1650   GLUCOSEU NEGATIVE 11/09/2023 1650   HGBUR LARGE (A) 11/09/2023 1650   BILIRUBINUR NEGATIVE 11/09/2023 1650   BILIRUBINUR negative 07/26/2022 1046   KETONESUR NEGATIVE 11/09/2023 1650   PROTEINUR NEGATIVE 11/09/2023 1650   UROBILINOGEN 0.2 07/26/2022 1046   NITRITE NEGATIVE 11/09/2023 1650   LEUKOCYTESUR NEGATIVE 11/09/2023 1650   Sepsis Labs: @LABRCNTIP (procalcitonin:4,lacticidven:4) Microbiology: Recent Results (from the past 240 hours)  Resp panel by RT-PCR (RSV, Flu A&B, Covid) Urine, Clean Catch      Status: None   Collection Time: 11/09/23  4:50 PM   Specimen: Urine, Clean Catch; Nasal Swab  Result Value Ref Range Status   SARS Coronavirus 2 by RT PCR NEGATIVE NEGATIVE Final    Comment: (NOTE) SARS-CoV-2 target nucleic acids are NOT DETECTED.  The SARS-CoV-2 RNA is generally detectable in upper respiratory specimens during the acute phase of infection. The lowest concentration of SARS-CoV-2 viral copies this assay can detect is 138 copies/mL. A negative result does not preclude SARS-Cov-2 infection and should not be used as the sole basis for treatment or other patient management decisions. A negative result may occur with  improper specimen collection/handling, submission of specimen other than nasopharyngeal swab, presence of viral mutation(s) within the areas targeted by this assay, and inadequate number of viral copies(<138 copies/mL). A negative result must be combined with clinical observations, patient history, and epidemiological information. The expected result is Negative.  Fact Sheet for Patients:  BloggerCourse.com  Fact Sheet for Healthcare Providers:  SeriousBroker.it  This test is no t yet approved or cleared by the [place]  FDA and  has been authorized for detection and/or diagnosis of SARS-CoV-2 by FDA under an Emergency Use Authorization (EUA). This EUA will remain  in effect (meaning this test can be used) for the duration of the COVID-19 declaration under Section 564(b)(1) of the Act, 21 U.S.C.section 360bbb-3(b)(1), unless the authorization is terminated  or revoked sooner.       Influenza A by PCR NEGATIVE NEGATIVE Final   Influenza B by PCR NEGATIVE NEGATIVE Final    Comment: (NOTE) The Xpert Xpress SARS-CoV-2/FLU/RSV plus assay is intended as an aid in the diagnosis of influenza from Nasopharyngeal swab specimens and should not be used as a sole basis for treatment. Nasal washings  and aspirates are unacceptable for Xpert Xpress SARS-CoV-2/FLU/RSV testing.  Fact Sheet for Patients: BloggerCourse.com  Fact Sheet for Healthcare Providers: SeriousBroker.it  This test is not yet approved or cleared by the [place]  FDA and has been authorized for detection and/or diagnosis of SARS-CoV-2 by FDA under an Emergency Use Authorization (EUA). This EUA will remain in  effect (meaning this test can be used) for the duration of the COVID-19 declaration under Section 564(b)(1) of the Act, 21 U.S.C. section 360bbb-3(b)(1), unless the authorization is terminated or revoked.     Resp Syncytial Virus by PCR NEGATIVE NEGATIVE Final    Comment: (NOTE) Fact Sheet for Patients: BloggerCourse.com  Fact Sheet for Healthcare Providers: SeriousBroker.it  This test is not yet approved or cleared by the [place]  FDA and has been authorized for detection and/or diagnosis of SARS-CoV-2 by FDA under an Emergency Use Authorization (EUA). This EUA will remain in effect (meaning this test can be used) for the duration of the COVID-19 declaration under Section 564(b)(1) of the Act, 21 U.S.C. section 360bbb-3(b)(1), unless the authorization is terminated or revoked.  Performed at [facility], [address] [place], [place] [address]       Radiology Studies last 3 days: DG Chest Portable 1 View Result Date: 11/09/2023 CLINICAL DATA:  weakness EXAM: PORTABLE CHEST - 1 VIEW COMPARISON:  10/27/2023 FINDINGS: Right chest port is similarly positioned terminating at the cavoatrial junction. No focal airspace consolidation, pleural effusion, or pneumothorax. No cardiomegaly. Aortic atherosclerosis. No acute fracture or destructive lesions. Multilevel thoracic osteophytosis. Surgical clips in the left upper quadrant. IMPRESSION: No acute cardiopulmonary abnormality.  Electronically Signed   By: Rogelia Myers M.D.   On: 11/09/2023 16:59          Laneta Blunt, DO [facility] 11/13/2023, 2:20 PM    Dictation software may have been used to generate the above note. Typos may occur and escape review in typed/dictated notes. Please contact Dr Blunt directly for clarity if needed.  Staff may message me via secure chat in Epic  but this may not receive an immediate response,  please page me for urgent matters!  If 7PM-7AM, please contact night coverage [email or web address]

## 2023-11-14 ENCOUNTER — Encounter: Payer: Self-pay | Admitting: Osteopathic Medicine

## 2023-11-14 DIAGNOSIS — I4891 Unspecified atrial fibrillation: Secondary | ICD-10-CM | POA: Diagnosis not present

## 2023-11-14 MED ORDER — ORAL CARE MOUTH RINSE: 15.0000 mL | OROMUCOSAL | Status: DC | PRN

## 2023-11-14 NOTE — Progress Notes (Signed)
 Occupational Therapy Treatment Patient Details Name: Colleen [name] MRN: [id number] DOB: [birth year] Today's Date: 11/14/2023   History of present illness Patient is a 78 year old female with weakness, A-fib with RVR. Recent hospitalization with SIRS due to worsening anemia. PMH: metastasized stage IV pancreatic cancer on chemotherapy, HTN, HLD, prediabetes, COPD/asthma, gout, GERD, CKD-3, anemia, A-fib and PE not on Eliquis , lumbar spinal stenosis   OT comments  Patient seen for OT treatment on this date. Upon arrival to room patient supine in bed, not agreeable to treatment. OT provided max encouragmeent/coaxing to participate but she continues to refuse to get OOB. Patient reports I'm going home anyway, per chart review patient waiting on bed for SNF placement, discussed this with patient but she continues to refuse. Attempted to encourage patient to participate to work towards dc home but this was not effective. Discussed home set up and recommending TTB vs shower chair, patient refused. OT attempted light there ex in bed for UB mobility (see below) with patient performing 3-5 reps of each before reporting I'm tired, leave me alone. Patient ended treatment in bed with bed alarm on and all needs within reach. Patient not making progress toward goals due to refusal to participate, will continue to follow POC. Discharge recommendation remains appropriate.        If plan is discharge home, recommend the following:  A lot of help with walking and/or transfers;A lot of help with bathing/dressing/bathroom;Assistance with cooking/housework;Assist for transportation;Help with stairs or ramp for entrance   Equipment Recommendations  Other (comment) (defer to next venue of care)    Recommendations for Other Services      Precautions / Restrictions Precautions Precautions: Fall Recall of Precautions/Restrictions: Impaired Restrictions Weight Bearing Restrictions Per Provider Order: No        Mobility Bed Mobility               General bed mobility comments: refused to demonstrate    Transfers                   General transfer comment: refused to demonstrate     Balance                                           ADL either performed or assessed with clinical judgement   ADL Overall ADL's : Needs assistance/impaired                                       General ADL Comments: refused to participate    Extremity/Trunk Assessment Upper Extremity Assessment Upper Extremity Assessment: Generalized weakness            Vision       Perception     Praxis     Communication Communication Communication: No apparent difficulties   Cognition Arousal: Alert Behavior During Therapy: WFL for tasks assessed/performed Cognition: No family/caregiver present to determine baseline             OT - Cognition Comments: refused to get out of bed                 Following commands: Intact        Cueing   Cueing Techniques: Verbal cues  Exercises Other Exercises Other Exercises: shoulder mobliity in bed including AAROM  shoulder flexion, shoulder circles, neck flexion/extension, lateral flexion/extension    Shoulder Instructions       General Comments      Pertinent Vitals/ Pain       Pain Assessment Pain Assessment: No/denies pain  Home Living                                          Prior Functioning/Environment              Frequency  Min 2X/week        Progress Toward Goals  OT Goals(current goals can now be found in the care plan section)  Progress towards OT goals: Not progressing toward goals - comment (refused to participate in OOB activity)  Acute Rehab OT Goals Patient Stated Goal: to go home OT Goal Formulation: With patient Time For Goal Achievement: 11/24/23 Potential to Achieve Goals: Fair ADL Goals Pt Will Perform Upper Body Dressing:  sitting;with modified independence Pt Will Perform Lower Body Dressing: sit to/from stand;with supervision Pt Will Transfer to Toilet: ambulating;with supervision Pt Will Perform Toileting - Clothing Manipulation and hygiene: with modified independence;sitting/lateral leans Additional ADL Goal #1: Pt will verbalize plan to implement at least 1 learned falls prevention strategy.  Plan      Co-evaluation                 AM-PAC OT 6 Clicks Daily Activity     Outcome Measure   Help from another person eating meals?: None Help from another person taking care of personal grooming?: A Little Help from another person toileting, which includes using toliet, bedpan, or urinal?: A Little Help from another person bathing (including washing, rinsing, drying)?: A Little Help from another person to put on and taking off regular upper body clothing?: A Little Help from another person to put on and taking off regular lower body clothing?: A Lot 6 Click Score: 18    End of Session    OT Visit Diagnosis: Unsteadiness on feet (R26.81);History of falling (Z91.81);Muscle weakness (generalized) (M62.81)   Activity Tolerance Patient limited by fatigue   Patient Left in bed;with call bell/phone within reach;with bed alarm set   Nurse Communication          Time: 8484-8468 OT Time Calculation (min): 16 min  Charges: OT General Charges $OT Visit: 1 Visit OT Treatments $Therapeutic Exercise: 8-22 mins  Rogers Clause, OT/L MSOT, 11/14/2023

## 2023-11-14 NOTE — TOC Progression Note (Addendum)
 Transition of Care [facility]) - Progression Note    Patient Details  Name: Colleen [name] MRN: [id number] Date of Birth: Oct 11, 1945  Transition of Care [facility]) CM/SW Contact  Dalia GORMAN Fuse, RN Phone Number: 11/14/2023, 9:51 AM  Clinical Narrative:    TOC spoke with the patient's niece Clarissa to offer choice. Per Clarissa she is waiting on f/u from previous SW on whether or not [facility] can accept the patient receiving active chemo treatments and allow her to drive the patient to and from chemo appts.    TOC spoke with Tammy at [facility] and she doesn't think that will be an issue. TOC looked at the [facility] and the patient takes, Megace  daily. The cost of Megace  is typically less than $1 per day.  TOC LVMM with [facility] as well to see if they would accept the patient with her niece transporting her to and from her appts. TOC is waiting on f/u from [facility].  TOC spoke with Clariss and if [facility] can accommodate she will accept the bed at [facility], if they are unable to accommodate she will accept the bed at [facility].  10:35: [facility] [employer] declined the patient in the hub. TOC lvmm for Jori at [facility] requesting auth for [facility]. TOC accepted [facility] in the hub and leftt a message with Tammy at [facility] to make her aware.  12:15: nursing shared the patient is advising her niece is picking her up and taking her home today  12:17 TOC spoke with her niece [name] and she reiterated that the plan is for her to pick the patient up and take her to [facility] once we have ins auth  TOC will continue to follow.                      Expected Discharge Plan and Services         Expected Discharge Date: 11/12/23                                     Social Drivers of Health (SDOH) Interventions SDOH Screenings   Food Insecurity: Food Insecurity Present (11/11/2023)  Housing: Low Risk  (11/11/2023)  Transportation Needs: No Transportation Needs (11/11/2023)  Recent Concern: Transportation Needs - Unmet Transportation  Needs (10/01/2023)  Utilities: At Risk (11/11/2023)  Alcohol Screen: Low Risk  (12/02/2022)  Depression (PHQ2-9): Low Risk  (11/08/2023)  Financial Resource Strain: Low Risk  (12/02/2022)  Physical Activity: Insufficiently Active (12/02/2022)  Social Connections: Socially Isolated (11/11/2023)  Stress: No Stress Concern Present (12/02/2022)  Tobacco Use: Medium Risk (11/14/2023)  Health Literacy: Adequate Health Literacy (12/02/2022)    Readmission Risk Interventions    10/28/2023    4:03 PM 10/03/2023    3:20 PM  Readmission Risk Prevention Plan  Transportation Screening Complete Complete  Medication Review Oceanographer) Complete Complete  PCP or Specialist appointment within 3-5 days of discharge Complete Complete  HRI or Home Care Consult Complete   SW Recovery Care/Counseling Consult Complete Complete  Palliative Care Screening Not Applicable Not Applicable  Skilled Nursing Facility Not Applicable Not Applicable

## 2023-11-14 NOTE — Plan of Care (Signed)

## 2023-11-14 NOTE — Progress Notes (Signed)
 PROGRESS NOTE    Colleen [name]   FMW:[id number] DOB: 1945/02/27  DOA: 11/09/2023 Date of Service: 11/14/23 which is hospital day 4  PCP: Leavy Mole, [facility] course / significant events:   Colleen [name] is a 78 y.o. female with medical history significant of metastasized stage IV pancreatic cancer on chemotherapy, HTN, HLD, prediabetes, COPD/asthma, gout, GERD, CKD-3, anemia, A-fib and PE not on Eliquis , lumbar spinal stenosis, who presents with weakness   HPI:  weakness worsening over 1 week. She has poor appetite and decreased oral intake with generalized weakness. Pt states that she was trying to get to the kitchen to make something to eat however she was feeling very weak and lowered herself down to the ground. No fall. Of note, was recently hospitalized from 10/2 - 10/10 due to SIRS due to worsening anemia.  Hemoglobin down to 6.8, received 1 unit of blood transfusion.  Eliquis  was discontinued as recommended by cardiologist.   10/15: admitted for Afib RVR, HR improved w/o intervention.  10/16: still weak, PT/OT recs for SNF, TOC consult placed, see TOC note today.  10/17-10/20: awaiting placement      Consultants:  none  Procedures/Surgeries: none      ASSESSMENT & PLAN:   Atrial fibrillation with RVR (RVR resolved):  Continue home amiodarone  200 mg daily Monitor Mg and K   Myocardial injury:  demand ischemia.   Patient has chronically elevated troponin 40-50s. pt states that she can not tolerate ASA due to stomach issue Lipitor 10 mg daily Recheck as needed    Hypotension Midodrine  If this isn't improving will cosndier reduce amiodarone  but it's controlling rate so hesitate to stop this   Hypokalemia and hypophosphatemia:  Replace as needed Monitor levels    Primary pancreatic cancer with metastasis to other site  Follow-up with Dr. Jacobo   Hypotension:  Blood pressure soft.  Patient is not taking blood pressure medications currently  other than Afib meds as above . Observe closely Midodrine     HLD (hyperlipidemia) started Lipitor 10 mg daily due to myocardial injury   COPD (chronic obstructive pulmonary disease) (HCC): Stable Bronchodilators as needed Mucinex    Gout: Allopurinol    Chronic kidney disease, stage 3a  Stable, GFR> 60 Follow-up with BMP   Generalized weakness: Likely multifactoral etiology Fall precaution PT/OT   Normocytic anemia CBC    N/a based on BMI: There is no height or weight on file to calculate BMI.. Significantly low or high BMI is associated with higher medical risk.  Underweight - under 18  overweight - 25 to 29 obese - 30 or more Class 1 obesity: BMI of 30.0 to 34 Class 2 obesity: BMI of 35.0 to 39 Class 3 obesity: BMI of 40.0 to 49 Super Morbid Obesity: BMI 50-59 Super-super Morbid Obesity: BMI 60+ Healthy nutrition and physical activity advised as adjunct to other disease management and risk reduction treatments    DVT prophylaxis: SCD d/t anemia  IV fluids: no continuous IV fluids  Nutrition: cardiac Central lines / other devices: none  Code Status: FULL CODE per patient per H&P ACP documentation reviewed:  none on file in [facility]  TOC needs: SNF rehab Medical barriers to dispo: none             Subjective / Brief ROS:  Patient reports feeling okay today See TOC notes - pt is frustrated at still being here  No complaints Denies CP/SOB.     Family Communication: none at this time  Objective Findings:  Vitals:   11/13/23 2013 11/14/23 0456 11/14/23 0835 11/14/23 1552  BP: (!) 104/53 (!) 92/54 (!) 98/44 (!) 118/57  Pulse: 90 70 (!) 58 62  Resp: 15 15 17 18   Temp: 97.8 F (36.6 C) 98.3 F (36.8 C) 98.8 F (37.1 C) 98.2 F (36.8 C)  TempSrc: Oral     SpO2: 100% 100% 100% 100%  Weight:   81.4 kg   Height:        Intake/Output Summary (Last 24 hours) at 11/14/2023 1620 Last data filed at 11/14/2023 0900 Gross per 24 hour  Intake  480 ml  Output --  Net 480 ml    Filed Weights   11/14/23 0835  Weight: 81.4 kg    Examination:  Physical Exam Constitutional:      General: She is not in acute distress. Cardiovascular:     Rate and Rhythm: Normal rate and regular rhythm.  Pulmonary:     Effort: Pulmonary effort is normal.     Breath sounds: Normal breath sounds.  Neurological:     Mental Status: She is alert and oriented to person, place, and time. Mental status is at baseline.  Psychiatric:        Mood and Affect: Mood normal.        Behavior: Behavior normal.          Scheduled Medications:   allopurinol   200 mg Oral Daily   amiodarone   200 mg Oral Daily   atorvastatin  10 mg Oral Daily   budesonide -glycopyrrolate -formoterol   2 puff Inhalation BID   cholecalciferol  1,000 Units Oral Daily   enoxaparin  (LOVENOX ) injection  40 mg Subcutaneous QHS   magnesium  chloride  1 tablet Oral Daily   megestrol   20 mg Oral BID   midodrine   5 mg Oral TID WC   multivitamin with minerals  1 tablet Oral Daily    Continuous Infusions:   PRN Medications:  acetaminophen , albuterol , dextromethorphan-guaiFENesin , docusate sodium, metoprolol  tartrate, ondansetron  (ZOFRAN ) IV, mouth rinse, polyethylene glycol, simethicone   Antimicrobials from admission:  Anti-infectives (From admission, onward)    None           Data Reviewed:  I have personally reviewed the following...  CBC: Recent Labs  Lab 11/08/23 0811 11/09/23 1617 11/10/23 0117 11/11/23 0401 11/12/23 0352  WBC 5.5 8.5 8.0 11.2* 11.5*  NEUTROABS 3.0  --   --   --   --   HGB 9.0* 8.9* 9.1* 8.2* 8.1*  HCT 26.9* 27.3* 29.1* 25.4* 24.5*  MCV 89.7 92.5 95.7 93.0 91.8  PLT 534* 465* 397 345 340   Basic Metabolic Panel: Recent Labs  Lab 11/08/23 0811 11/09/23 1617 11/10/23 0117 11/11/23 0401 11/12/23 0352  NA 134* 140 140 140 140  K 2.8* 2.6* 3.2* 3.1* 4.2  CL 105 107 110 110 112*  CO2 20* 23 18* 23 23  GLUCOSE 85 93 124* 90 98   BUN 16 13 12 10 12   CREATININE 1.07* 0.86 0.85 0.61 0.56  CALCIUM  8.3* 7.5* 7.2* 7.4* 7.8*  MG  --  1.7 2.6* 1.9  --   PHOS  --  2.1* 2.2*  --   --    GFR: Estimated Creatinine Clearance: 60.8 mL/min (by C-G formula based on SCr of 0.56 mg/dL). Liver Function Tests: Recent Labs  Lab 11/08/23 0811 11/09/23 1617  AST 27 41  ALT 14 15  ALKPHOS 93 80  BILITOT 0.9 0.9  PROT 6.0* 5.8*  ALBUMIN 2.5* 2.4*  No results for input(s): LIPASE, AMYLASE in the last 168 hours. No results for input(s): AMMONIA in the last 168 hours. Coagulation Profile: No results for input(s): INR, PROTIME in the last 168 hours. Cardiac Enzymes: No results for input(s): CKTOTAL, CKMB, CKMBINDEX, TROPONINI in the last 168 hours. BNP (last 3 results) No results for input(s): PROBNP in the last 8760 hours. HbA1C: No results for input(s): HGBA1C in the last 72 hours.  CBG: No results for input(s): GLUCAP in the last 168 hours. Lipid Profile: No results for input(s): CHOL, HDL, LDLCALC, TRIG, CHOLHDL, LDLDIRECT in the last 72 hours.  Thyroid  Function Tests: No results for input(s): TSH, T4TOTAL, FREET4, T3FREE, THYROIDAB in the last 72 hours. Anemia Panel: No results for input(s): VITAMINB12, FOLATE, FERRITIN, TIBC, IRON, RETICCTPCT in the last 72 hours. Most Recent Urinalysis On File:     Component Value Date/Time   COLORURINE STRAW (A) 11/09/2023 1650   APPEARANCEUR CLEAR (A) 11/09/2023 1650   LABSPEC 1.005 11/09/2023 1650   PHURINE 6.0 11/09/2023 1650   GLUCOSEU NEGATIVE 11/09/2023 1650   HGBUR LARGE (A) 11/09/2023 1650   BILIRUBINUR NEGATIVE 11/09/2023 1650   BILIRUBINUR negative 07/26/2022 1046   KETONESUR NEGATIVE 11/09/2023 1650   PROTEINUR NEGATIVE 11/09/2023 1650   UROBILINOGEN 0.2 07/26/2022 1046   NITRITE NEGATIVE 11/09/2023 1650   LEUKOCYTESUR NEGATIVE 11/09/2023 1650   Sepsis  Labs: @LABRCNTIP (procalcitonin:4,lacticidven:4) Microbiology: Recent Results (from the past 240 hours)  Resp panel by RT-PCR (RSV, Flu A&B, Covid) Urine, Clean Catch     Status: None   Collection Time: 11/09/23  4:50 PM   Specimen: Urine, Clean Catch; Nasal Swab  Result Value Ref Range Status   SARS Coronavirus 2 by RT PCR NEGATIVE NEGATIVE Final    Comment: (NOTE) SARS-CoV-2 target nucleic acids are NOT DETECTED.  The SARS-CoV-2 RNA is generally detectable in upper respiratory specimens during the acute phase of infection. The lowest concentration of SARS-CoV-2 viral copies this assay can detect is 138 copies/mL. A negative result does not preclude SARS-Cov-2 infection and should not be used as the sole basis for treatment or other patient management decisions. A negative result may occur with  improper specimen collection/handling, submission of specimen other than nasopharyngeal swab, presence of viral mutation(s) within the areas targeted by this assay, and inadequate number of viral copies(<138 copies/mL). A negative result must be combined with clinical observations, patient history, and epidemiological information. The expected result is Negative.  Fact Sheet for Patients:  BloggerCourse.com  Fact Sheet for Healthcare Providers:  SeriousBroker.it  This test is no t yet approved or cleared by the [place]  FDA and  has been authorized for detection and/or diagnosis of SARS-CoV-2 by FDA under an Emergency Use Authorization (EUA). This EUA will remain  in effect (meaning this test can be used) for the duration of the COVID-19 declaration under Section 564(b)(1) of the Act, 21 U.S.C.section 360bbb-3(b)(1), unless the authorization is terminated  or revoked sooner.       Influenza A by PCR NEGATIVE NEGATIVE Final   Influenza B by PCR NEGATIVE NEGATIVE Final    Comment: (NOTE) The Xpert Xpress SARS-CoV-2/FLU/RSV plus  assay is intended as an aid in the diagnosis of influenza from Nasopharyngeal swab specimens and should not be used as a sole basis for treatment. Nasal washings and aspirates are unacceptable for Xpert Xpress SARS-CoV-2/FLU/RSV testing.  Fact Sheet for Patients: BloggerCourse.com  Fact Sheet for Healthcare Providers: SeriousBroker.it  This test is not yet approved or cleared by the [place]   FDA and has been authorized for detection and/or diagnosis of SARS-CoV-2 by FDA under an Emergency Use Authorization (EUA). This EUA will remain in effect (meaning this test can be used) for the duration of the COVID-19 declaration under Section 564(b)(1) of the Act, 21 U.S.C. section 360bbb-3(b)(1), unless the authorization is terminated or revoked.     Resp Syncytial Virus by PCR NEGATIVE NEGATIVE Final    Comment: (NOTE) Fact Sheet for Patients: BloggerCourse.com  Fact Sheet for Healthcare Providers: SeriousBroker.it  This test is not yet approved or cleared by the [place]  FDA and has been authorized for detection and/or diagnosis of SARS-CoV-2 by FDA under an Emergency Use Authorization (EUA). This EUA will remain in effect (meaning this test can be used) for the duration of the COVID-19 declaration under Section 564(b)(1) of the Act, 21 U.S.C. section 360bbb-3(b)(1), unless the authorization is terminated or revoked.  Performed at [facility], [address] [place], [place] [address]       Radiology Studies last 3 days: No results found.         Bowdy Bair, DO [facility] 11/14/2023, 4:20 PM    Dictation software may have been used to generate the above note. Typos may occur and escape review in typed/dictated notes. Please contact Dr Marsa directly for clarity if needed.  Staff may message me via secure chat in Epic  but  this may not receive an immediate response,  please page me for urgent matters!  If 7PM-7AM, please contact night coverage [email or web address]

## 2023-11-15 ENCOUNTER — Inpatient Hospital Stay

## 2023-11-15 ENCOUNTER — Ambulatory Visit

## 2023-11-15 ENCOUNTER — Ambulatory Visit: Admitting: Oncology

## 2023-11-15 ENCOUNTER — Inpatient Hospital Stay: Admitting: Oncology

## 2023-11-15 ENCOUNTER — Other Ambulatory Visit

## 2023-11-15 DIAGNOSIS — I4891 Unspecified atrial fibrillation: Secondary | ICD-10-CM | POA: Diagnosis not present

## 2023-11-15 NOTE — Progress Notes (Signed)
 Physical Therapy Treatment Patient Details Name: Colleen [name] MRN: [id number] DOB: 1945/06/04 Today's Date: 11/15/2023   History of Present Illness Patient is a 78 year old female with weakness, A-fib with RVR. Recent hospitalization with SIRS due to worsening anemia. PMH: metastasized stage IV pancreatic cancer on chemotherapy, HTN, HLD, prediabetes, COPD/asthma, gout, GERD, CKD-3, anemia, A-fib and PE not on Eliquis , lumbar spinal stenosis    PT Comments  Patient is cooperative and agreeable to therapy session. She continues to have generalized weakness and is fatigued with activity. She needs assistance with bed mobility. Standing required moderate assistance. Pre-gait activity performed while standing. Poor standing balance and standing tolerance limited for progression of walking today. The patient is not at her baseline level of functional independence. Rehabilitation < 3 hours/day recommended after this hospital stay. PT will continue to follow.    If plan is discharge home, recommend the following: A little help with walking and/or transfers;A little help with bathing/dressing/bathroom   Can travel by private vehicle     No  Equipment Recommendations  None recommended by PT    Recommendations for Other Services       Precautions / Restrictions Precautions Precautions: Fall Recall of Precautions/Restrictions: Impaired Restrictions Weight Bearing Restrictions Per Provider Order: No     Mobility  Bed Mobility Overal bed mobility: Needs Assistance Bed Mobility: Rolling, Supine to Sit, Sit to Supine Rolling: Min assist   Supine to sit: Min assist Sit to supine: Min assist   General bed mobility comments: assistance for trunk support to sit upright. assistance for LE to return to bed. rolling assist provided for pericare as patient incontinent of bowels after standing. cues for techniques    Transfers Overall transfer level: Needs assistance Equipment used: Rolling  walker (2 wheels) Transfers: Sit to/from Stand Sit to Stand: Mod assist           General transfer comment: lifting assistance required for standing. cues for anterior weight shifting and hand placement    Ambulation/Gait             Pre-gait activities: cues for weight shifting for side steps and upright standing posture. unable to take side steps at this time General Gait Details: unable to due to poor standing tolerance, fatigue with activity.   Stairs             Wheelchair Mobility     Tilt Bed    Modified Rankin (Stroke Patients Only)       Balance Overall balance assessment: Needs assistance Sitting-balance support: Feet supported, No upper extremity supported Sitting balance-Leahy Scale: Fair     Standing balance support: Bilateral upper extremity supported, Single extremity supported, During functional activity, Reliant on assistive device for balance Standing balance-Leahy Scale: Poor Standing balance comment: external support required to maintain standing balance                            Communication Communication Communication: No apparent difficulties  Cognition Arousal: Alert Behavior During Therapy: WFL for tasks assessed/performed   PT - Cognitive impairments: No apparent impairments                         Following commands: Intact      Cueing    Exercises      General Comments General comments (skin integrity, edema, etc.): patient fatigued with activity      Pertinent Vitals/Pain Pain Assessment Pain Assessment:  No/denies pain    Home Living                          Prior Function            PT Goals (current goals can now be found in the care plan section) Acute Rehab PT Goals Patient Stated Goal: rehab PT Goal Formulation: With patient Time For Goal Achievement: 11/24/23 Potential to Achieve Goals: Good Progress towards PT goals: Progressing toward goals     Frequency    Min 2X/week      PT Plan      Co-evaluation              AM-PAC PT 6 Clicks Mobility   Outcome Measure  Help needed turning from your back to your side while in a flat bed without using bedrails?: None Help needed moving from lying on your back to sitting on the side of a flat bed without using bedrails?: A Little Help needed moving to and from a bed to a chair (including a wheelchair)?: A Little Help needed standing up from a chair using your arms (e.g., wheelchair or bedside chair)?: A Lot Help needed to walk in hospital room?: A Lot Help needed climbing 3-5 steps with a railing? : Total 6 Click Score: 15    End of Session   Activity Tolerance: Patient tolerated treatment well;Patient limited by fatigue Patient left: in bed;with call bell/phone within reach;with bed alarm set   PT Visit Diagnosis: Muscle weakness (generalized) (M62.81);Unsteadiness on feet (R26.81)     Time: 8660-8594 PT Time Calculation (min) (ACUTE ONLY): 26 min  Charges:    $Therapeutic Activity: 23-37 mins PT General Charges $$ ACUTE PT VISIT: 1 Visit                     Randine Essex, PT, MPT    Randine LULLA Essex 11/15/2023, 2:13 PM

## 2023-11-15 NOTE — Plan of Care (Signed)
   Problem: Clinical Measurements: Goal: Respiratory complications will improve Outcome: Progressing   Problem: Activity: Goal: Risk for activity intolerance will decrease Outcome: Progressing

## 2023-11-15 NOTE — TOC Progression Note (Addendum)
 Transition of Care [facility]) - Progression Note    Patient Details  Name: Colleen [name] MRN: [id number] Date of Birth: [birth year]  Transition of Care [facility]) CM/SW Contact  Dalia GORMAN Fuse, RN Phone Number: 11/15/2023, 1:47 PM  Clinical Narrative:     TOC received a call from [facility]. The patient refused to participate in OT yesterday, as it stands she will likely be denied for SNF. She will allow therapy notes from today. TOC sent secure chat with MD PT and OT requesting updated evals.                   Expected Discharge Plan and Services         Expected Discharge Date: 11/12/23                                     Social Drivers of Health (SDOH) Interventions SDOH Screenings   Food Insecurity: Food Insecurity Present (11/11/2023)  Housing: Low Risk  (11/11/2023)  Transportation Needs: No Transportation Needs (11/11/2023)  Recent Concern: Transportation Needs - Unmet Transportation Needs (10/01/2023)  Utilities: At Risk (11/11/2023)  Alcohol Screen: Low Risk  (12/02/2022)  Depression (PHQ2-9): Low Risk  (11/08/2023)  Financial Resource Strain: Low Risk  (12/02/2022)  Physical Activity: Insufficiently Active (12/02/2022)  Social Connections: Socially Isolated (11/11/2023)  Stress: No Stress Concern Present (12/02/2022)  Tobacco Use: Medium Risk (11/14/2023)  Health Literacy: Adequate Health Literacy (12/02/2022)    Readmission Risk Interventions    10/28/2023    4:03 PM 10/03/2023    3:20 PM  Readmission Risk Prevention Plan  Transportation Screening Complete Complete  Medication Review Oceanographer) Complete Complete  PCP or Specialist appointment within 3-5 days of discharge Complete Complete  HRI or Home Care Consult Complete   SW Recovery Care/Counseling Consult Complete Complete  Palliative Care Screening Not Applicable Not Applicable  Skilled Nursing Facility Not Applicable Not Applicable

## 2023-11-15 NOTE — Progress Notes (Signed)
 PROGRESS NOTE    MACALL MCCROSKEY   FMW:[id number] DOB: [birth year]  DOA: 11/09/2023 Date of Service: 11/15/23 which is hospital day 5  PCP: Leavy Mole, [facility] course / significant events:   Colleen [name] is a 78 y.o. female with medical history significant of metastasized stage IV pancreatic cancer on chemotherapy, HTN, HLD, prediabetes, COPD/asthma, gout, GERD, CKD-3, anemia, A-fib and PE not on Eliquis , lumbar spinal stenosis, who presents with weakness   HPI:  weakness worsening over 1 week. She has poor appetite and decreased oral intake with generalized weakness. Pt states that she was trying to get to the kitchen to make something to eat however she was feeling very weak and lowered herself down to the ground. No fall. Of note, was recently hospitalized from 10/2 - 10/10 due to SIRS due to worsening anemia.  Hemoglobin down to 6.8, received 1 unit of blood transfusion.  Eliquis  was discontinued as recommended by cardiologist.   10/15: admitted for Afib RVR, HR improved w/o intervention.  10/16: still weak, PT/OT recs for SNF, TOC consult placed, see TOC note today.  10/17-10/21: awaiting placement      Consultants:  none  Procedures/Surgeries: none      ASSESSMENT & PLAN:   Atrial fibrillation with RVR (RVR resolved):  Continue home amiodarone  200 mg daily Monitor Mg and K   Myocardial injury:  demand ischemia.   Patient has chronically elevated troponin 40-50s. pt states that she can not tolerate ASA due to stomach issue Lipitor 10 mg daily Recheck as needed    Hypotension Midodrine  If this isn't improving will cosndier reduce amiodarone  but it's controlling rate so hesitate to stop this   Hypokalemia and hypophosphatemia:  Replace as needed Monitor levels    Primary pancreatic cancer with metastasis to other site  Follow-up with Dr. Jacobo   Hypotension:  Blood pressure soft.  Patient is not taking blood pressure medications currently  other than Afib meds as above . Midodrine     HLD (hyperlipidemia) started Lipitor 10 mg daily due to myocardial injury   COPD (chronic obstructive pulmonary disease) (HCC): Stable Bronchodilators as needed Mucinex    Gout: Allopurinol    Chronic kidney disease, stage 3a  Stable, GFR> 60 Follow-up with BMP   Generalized weakness: Likely multifactoral etiology Fall precaution PT/OT   Normocytic anemia CBC    N/a based on BMI: There is no height or weight on file to calculate BMI.. Significantly low or high BMI is associated with higher medical risk.  Underweight - under 18  overweight - 25 to 29 obese - 30 or more Class 1 obesity: BMI of 30.0 to 34 Class 2 obesity: BMI of 35.0 to 39 Class 3 obesity: BMI of 40.0 to 49 Super Morbid Obesity: BMI 50-59 Super-super Morbid Obesity: BMI 60+ Healthy nutrition and physical activity advised as adjunct to other disease management and risk reduction treatments    DVT prophylaxis: SCD d/t anemia  IV fluids: no continuous IV fluids  Nutrition: cardiac Central lines / other devices: none  Code Status: FULL CODE per patient per H&P ACP documentation reviewed:  none on file in [facility]  TOC needs: SNF rehab Medical barriers to dispo: none             Subjective / Brief ROS:  Patient reports feeling okay today She is, as before, upset about being here and wants to leave as soon as she can, intermittently refusing to work with therapy but per PT/OT her niece  has been able to convince her to engage w/ therapy. No complaints Denies CP/SOB.     Family Communication: none at this time, spoke w/ daughter recently will call w/ medical updates otherwise TOC d/w family re SNF     Objective Findings:  Vitals:   11/15/23 0455 11/15/23 0825 11/15/23 0845 11/15/23 1617  BP: 107/61 107/68 (!) 100/54 114/72  Pulse: 99 66 63 96  Resp: 15 16 15 18   Temp: 97.9 F (36.6 C) 97.6 F (36.4 C) 97.8 F (36.6 C) 98.1 F (36.7 C)   TempSrc:   Oral Oral  SpO2: 100% 100% 100%   Weight:      Height:       No intake or output data in the 24 hours ending 11/15/23 1643   Filed Weights   11/14/23 0835  Weight: 81.4 kg    Examination:  Physical Exam Constitutional:      General: She is not in acute distress. Cardiovascular:     Rate and Rhythm: Normal rate and regular rhythm.  Pulmonary:     Effort: Pulmonary effort is normal.     Breath sounds: Normal breath sounds.  Neurological:     Mental Status: She is alert and oriented to person, place, and time. Mental status is at baseline.  Psychiatric:        Mood and Affect: Mood normal.        Behavior: Behavior normal.          Scheduled Medications:   allopurinol   200 mg Oral Daily   amiodarone   200 mg Oral Daily   atorvastatin  10 mg Oral Daily   budesonide -glycopyrrolate -formoterol   2 puff Inhalation BID   cholecalciferol  1,000 Units Oral Daily   enoxaparin  (LOVENOX ) injection  40 mg Subcutaneous QHS   magnesium  chloride  1 tablet Oral Daily   megestrol   20 mg Oral BID   midodrine   5 mg Oral TID WC   multivitamin with minerals  1 tablet Oral Daily    Continuous Infusions:   PRN Medications:  acetaminophen , albuterol , dextromethorphan-guaiFENesin , docusate sodium, metoprolol  tartrate, ondansetron  (ZOFRAN ) IV, mouth rinse, polyethylene glycol, simethicone   Antimicrobials from admission:  Anti-infectives (From admission, onward)    None           Data Reviewed:  I have personally reviewed the following...  CBC: Recent Labs  Lab 11/09/23 1617 11/10/23 0117 11/11/23 0401 11/12/23 0352  WBC 8.5 8.0 11.2* 11.5*  HGB 8.9* 9.1* 8.2* 8.1*  HCT 27.3* 29.1* 25.4* 24.5*  MCV 92.5 95.7 93.0 91.8  PLT 465* 397 345 340   Basic Metabolic Panel: Recent Labs  Lab 11/09/23 1617 11/10/23 0117 11/11/23 0401 11/12/23 0352  NA 140 140 140 140  K 2.6* 3.2* 3.1* 4.2  CL 107 110 110 112*  CO2 23 18* 23 23  GLUCOSE 93 124* 90 98  BUN  13 12 10 12   CREATININE 0.86 0.85 0.61 0.56  CALCIUM  7.5* 7.2* 7.4* 7.8*  MG 1.7 2.6* 1.9  --   PHOS 2.1* 2.2*  --   --    GFR: Estimated Creatinine Clearance: 60.8 mL/min (by C-G formula based on SCr of 0.56 mg/dL). Liver Function Tests: Recent Labs  Lab 11/09/23 1617  AST 41  ALT 15  ALKPHOS 80  BILITOT 0.9  PROT 5.8*  ALBUMIN 2.4*   No results for input(s): LIPASE, AMYLASE in the last 168 hours. No results for input(s): AMMONIA in the last 168 hours. Coagulation Profile: No results for input(s):  INR, PROTIME in the last 168 hours. Cardiac Enzymes: No results for input(s): CKTOTAL, CKMB, CKMBINDEX, TROPONINI in the last 168 hours. BNP (last 3 results) No results for input(s): PROBNP in the last 8760 hours. HbA1C: No results for input(s): HGBA1C in the last 72 hours.  CBG: No results for input(s): GLUCAP in the last 168 hours. Lipid Profile: No results for input(s): CHOL, HDL, LDLCALC, TRIG, CHOLHDL, LDLDIRECT in the last 72 hours.  Thyroid  Function Tests: No results for input(s): TSH, T4TOTAL, FREET4, T3FREE, THYROIDAB in the last 72 hours. Anemia Panel: No results for input(s): VITAMINB12, FOLATE, FERRITIN, TIBC, IRON, RETICCTPCT in the last 72 hours. Most Recent Urinalysis On File:     Component Value Date/Time   COLORURINE STRAW (A) 11/09/2023 1650   APPEARANCEUR CLEAR (A) 11/09/2023 1650   LABSPEC 1.005 11/09/2023 1650   PHURINE 6.0 11/09/2023 1650   GLUCOSEU NEGATIVE 11/09/2023 1650   HGBUR LARGE (A) 11/09/2023 1650   BILIRUBINUR NEGATIVE 11/09/2023 1650   BILIRUBINUR negative 07/26/2022 1046   KETONESUR NEGATIVE 11/09/2023 1650   PROTEINUR NEGATIVE 11/09/2023 1650   UROBILINOGEN 0.2 07/26/2022 1046   NITRITE NEGATIVE 11/09/2023 1650   LEUKOCYTESUR NEGATIVE 11/09/2023 1650   Sepsis Labs: @LABRCNTIP (procalcitonin:4,lacticidven:4) Microbiology: Recent Results (from the past 240 hours)  Resp  panel by RT-PCR (RSV, Flu A&B, Covid) Urine, Clean Catch     Status: None   Collection Time: 11/09/23  4:50 PM   Specimen: Urine, Clean Catch; Nasal Swab  Result Value Ref Range Status   SARS Coronavirus 2 by RT PCR NEGATIVE NEGATIVE Final    Comment: (NOTE) SARS-CoV-2 target nucleic acids are NOT DETECTED.  The SARS-CoV-2 RNA is generally detectable in upper respiratory specimens during the acute phase of infection. The lowest concentration of SARS-CoV-2 viral copies this assay can detect is 138 copies/mL. A negative result does not preclude SARS-Cov-2 infection and should not be used as the sole basis for treatment or other patient management decisions. A negative result may occur with  improper specimen collection/handling, submission of specimen other than nasopharyngeal swab, presence of viral mutation(s) within the areas targeted by this assay, and inadequate number of viral copies(<138 copies/mL). A negative result must be combined with clinical observations, patient history, and epidemiological information. The expected result is Negative.  Fact Sheet for Patients:  BloggerCourse.com  Fact Sheet for Healthcare Providers:  SeriousBroker.it  This test is no t yet approved or cleared by the [place]  FDA and  has been authorized for detection and/or diagnosis of SARS-CoV-2 by FDA under an Emergency Use Authorization (EUA). This EUA will remain  in effect (meaning this test can be used) for the duration of the COVID-19 declaration under Section 564(b)(1) of the Act, 21 U.S.C.section 360bbb-3(b)(1), unless the authorization is terminated  or revoked sooner.       Influenza A by PCR NEGATIVE NEGATIVE Final   Influenza B by PCR NEGATIVE NEGATIVE Final    Comment: (NOTE) The Xpert Xpress SARS-CoV-2/FLU/RSV plus assay is intended as an aid in the diagnosis of influenza from Nasopharyngeal swab specimens and should not be  used as a sole basis for treatment. Nasal washings and aspirates are unacceptable for Xpert Xpress SARS-CoV-2/FLU/RSV testing.  Fact Sheet for Patients: BloggerCourse.com  Fact Sheet for Healthcare Providers: SeriousBroker.it  This test is not yet approved or cleared by the [place]  FDA and has been authorized for detection and/or diagnosis of SARS-CoV-2 by FDA under an Emergency Use Authorization (EUA). This EUA will remain in effect (meaning this  test can be used) for the duration of the COVID-19 declaration under Section 564(b)(1) of the Act, 21 U.S.C. section 360bbb-3(b)(1), unless the authorization is terminated or revoked.     Resp Syncytial Virus by PCR NEGATIVE NEGATIVE Final    Comment: (NOTE) Fact Sheet for Patients: BloggerCourse.com  Fact Sheet for Healthcare Providers: SeriousBroker.it  This test is not yet approved or cleared by the [place]  FDA and has been authorized for detection and/or diagnosis of SARS-CoV-2 by FDA under an Emergency Use Authorization (EUA). This EUA will remain in effect (meaning this test can be used) for the duration of the COVID-19 declaration under Section 564(b)(1) of the Act, 21 U.S.C. section 360bbb-3(b)(1), unless the authorization is terminated or revoked.  Performed at [facility], [address], [place], [place] [address]       Radiology Studies last 3 days: No results found.         Charissa Knowles, DO [facility] 11/15/2023, 4:43 PM    Dictation software may have been used to generate the above note. Typos may occur and escape review in typed/dictated notes. Please contact Dr Marsa directly for clarity if needed.  Staff may message me via secure chat in Epic  but this may not receive an immediate response,  please page me for urgent matters!  If 7PM-7AM, please contact  night coverage [email or web address]

## 2023-11-15 NOTE — Plan of Care (Signed)

## 2023-11-16 DIAGNOSIS — R531 Weakness: Secondary | ICD-10-CM | POA: Diagnosis not present

## 2023-11-16 DIAGNOSIS — I5A Non-ischemic myocardial injury (non-traumatic): Secondary | ICD-10-CM | POA: Diagnosis not present

## 2023-11-16 DIAGNOSIS — E7849 Other hyperlipidemia: Secondary | ICD-10-CM

## 2023-11-16 DIAGNOSIS — I9589 Other hypotension: Secondary | ICD-10-CM

## 2023-11-16 DIAGNOSIS — E669 Obesity, unspecified: Secondary | ICD-10-CM

## 2023-11-16 DIAGNOSIS — C259 Malignant neoplasm of pancreas, unspecified: Secondary | ICD-10-CM | POA: Diagnosis not present

## 2023-11-16 DIAGNOSIS — I4891 Unspecified atrial fibrillation: Secondary | ICD-10-CM | POA: Diagnosis not present

## 2023-11-16 DIAGNOSIS — D649 Anemia, unspecified: Secondary | ICD-10-CM

## 2023-11-16 LAB — BASIC METABOLIC PANEL WITH GFR
Anion gap: 6 (ref 5–15)
BUN: 12 mg/dL (ref 8–23)
CO2: 24 mmol/L (ref 22–32)
Calcium: 7.9 mg/dL — ABNORMAL LOW (ref 8.9–10.3)
Chloride: 107 mmol/L (ref 98–111)
Creatinine, Ser: 0.62 mg/dL (ref 0.44–1.00)
GFR, Estimated: >60 mL/min (ref >60)
Glucose, Bld: 80 mg/dL (ref 70–99)
Potassium: 3.6 mmol/L (ref 3.5–5.1)
Sodium: 137 mmol/L (ref 135–145)

## 2023-11-16 LAB — CBC
HCT: 24.8 % — ABNORMAL LOW (ref 36.0–46.0)
Hemoglobin: 8.2 g/dL — ABNORMAL LOW (ref 12.0–15.0)
MCH: 30.3 pg (ref 26.0–34.0)
MCHC: 33.1 g/dL (ref 30.0–36.0)
MCV: 91.5 fL (ref 80.0–100.0)
Platelets: 165 K/uL (ref 150–400)
RBC: 2.71 MIL/uL — ABNORMAL LOW (ref 3.87–5.11)
RDW: 18 % — ABNORMAL HIGH (ref 11.5–15.5)
WBC: 10.4 K/uL (ref 4.0–10.5)
nRBC: 0.2 % (ref 0.0–0.2)

## 2023-11-16 NOTE — Assessment & Plan Note (Signed)
BMI 30.8 

## 2023-11-16 NOTE — Progress Notes (Signed)
 Progress Note   Patient: Colleen [name] FMW:[id number] DOB: November 06, 1945 DOA: 11/09/2023     6 DOS: the patient was seen and examined on 11/16/2023   Brief hospital course: Hospital course / significant events:   Colleen [name] is a 78 y.o. female with medical history significant of metastasized stage IV pancreatic cancer on chemotherapy, HTN, HLD, prediabetes, COPD/asthma, gout, GERD, CKD-3, anemia, A-fib and PE not on Eliquis , lumbar spinal stenosis, who presents with weakness   HPI:  weakness worsening over 1 week. She has poor appetite and decreased oral intake with generalized weakness. Pt states that she was trying to get to the kitchen to make something to eat however she was feeling very weak and lowered herself down to the ground. No fall. Of note, was recently hospitalized from 10/2 - 10/10 due to SIRS due to worsening anemia.  Hemoglobin down to 6.8, received 1 unit of blood transfusion.  Eliquis  was discontinued as recommended by cardiologist.   10/15: admitted for Afib RVR, HR improved w/o intervention.  10/16: still weak, PT/OT recs for SNF, TOC consult placed, see TOC note today.  10/17-10/21: awaiting placement  10/22.  As per [facility] awaiting insurance authorization for rehab.  Consultants:  none  Procedures/Surgeries: none            Assessment and Plan: * Atrial fibrillation with RVR (HCC) Continue amiodarone .  Not on anticoagulation.  Heart rates improved  Primary pancreatic cancer with metastasis to other site [facility]) Patient with stage IV metastatic pancreatic cancer.  Follow-up with oncology as outpatient.  Myocardial injury Likely from fast heart rate.  Unable to tolerate aspirin .  On Lipitor.  Generalized weakness Physical therapy recommending rehab  Hypotension On midodrine   Hypokalemia Replaced  Hyperlipidemia, unspecified On Lipitor  Hypophosphatemia Replaced  Chronic kidney disease, stage 3a (HCC) Last creatinine is 0.62 with a GFR  greater than 60  Normocytic anemia Last hemoglobin 8.2, ferritin elevated 6446.  Obesity (BMI 30-39.9) BMI 30.8        Subjective: Patient awaiting to go into rehab.  Feels okay.  Feels weak.  Stated she was supposed to have chemotherapy on Tuesday.  Admitted with atrial fibrillation  Physical Exam: Vitals:   11/15/23 1942 11/15/23 1946 11/16/23 0508 11/16/23 0856  BP: (!) 97/50 115/65 (!) 109/51 (!) 109/53  Pulse: 78 78 92 64  Resp: 18 17 18 18   Temp: 99.1 F (37.3 C) 99.1 F (37.3 C) 98.6 F (37 C) 98.9 F (37.2 C)  TempSrc:   Oral Oral  SpO2: 98% 98% 98% 100%  Weight:      Height:       Physical Exam HENT:     Head: Normocephalic.  Eyes:     General: Lids are normal.     Conjunctiva/sclera: Conjunctivae normal.  Cardiovascular:     Rate and Rhythm: Normal rate and regular rhythm.     Heart sounds: Normal heart sounds, S1 normal and S2 normal.  Pulmonary:     Breath sounds: No decreased breath sounds, wheezing, rhonchi or rales.  Abdominal:     Palpations: Abdomen is soft.     Tenderness: There is no abdominal tenderness.  Musculoskeletal:     Right lower leg: Swelling present.     Left lower leg: Swelling present.  Skin:    General: Skin is warm.     Findings: No rash.  Neurological:     Mental Status: She is alert and oriented to person, place, and time.  Data Reviewed: Hemoglobin 8.2, white blood count 10.4, platelet count 165, creatinine 0.62  Family Communication: spoke with with niece on the phone  Disposition: Status is: Inpatient Remains inpatient appropriate because:   Planned Discharge Destination: Rehab    Time spent: 28 minutes  Author: Charlie Patterson, MD 11/16/2023 4:46 PM  For on call review [email or web address].

## 2023-11-16 NOTE — Assessment & Plan Note (Signed)
 Replaced

## 2023-11-16 NOTE — Assessment & Plan Note (Addendum)
 Continue amiodarone .  Not on anticoagulation.  Heart rates improved.

## 2023-11-16 NOTE — Assessment & Plan Note (Addendum)
 Midodrine  increased to 10 mg 3 times daily.  Will give fluid bolus.  May end up having to start Florinef.

## 2023-11-16 NOTE — Assessment & Plan Note (Signed)
 Likely from fast heart rate.  Unable to tolerate aspirin .  On Lipitor.

## 2023-11-16 NOTE — Progress Notes (Signed)
 Occupational Therapy Treatment Patient Details Name: Colleen [name] MRN: [id number] DOB: 03-21-1945 Today's Date: 11/16/2023   History of present illness Patient is a 78 year old female with weakness, A-fib with RVR. Recent hospitalization with SIRS due to worsening anemia. PMH: metastasized stage IV pancreatic cancer on chemotherapy, HTN, HLD, prediabetes, COPD/asthma, gout, GERD, CKD-3, anemia, A-fib and PE not on Eliquis , lumbar spinal stenosis   OT comments  Patient seen for OT treatment on this date. Upon arrival to room patient semi fowlers in bed attempting to eat lunch but poorly positioned; OT suggested sitting in recliner to eat lunch, patient reluctant but ultimately agreeable.  Patient transitioned from semi fowlers to EOB with HOB raised, required min A to lift trunk to EOB, able to scoot to EOB and perform SPT from EOB to recliner, OT provided cues for pacing which patient ignored as she had previously stated she got dizzy earlier in the day, she reports I just want to sit down and be done. Patient refused any further intervention. Patient upset that lunch was cold, OT notified charge nurse who was agreeable to order more lunch. Patient ended treatment in recliner with chair alarm on and all needs within reach. Patient making good progress toward goals, will continue to follow POC. Discharge recommendation remains appropriate.        If plan is discharge home, recommend the following:  A lot of help with walking and/or transfers;A lot of help with bathing/dressing/bathroom;Assistance with cooking/housework;Assist for transportation;Help with stairs or ramp for entrance   Equipment Recommendations  Other (comment) (defer to next venue of care)    Recommendations for Other Services      Precautions / Restrictions Precautions Precautions: Fall Recall of Precautions/Restrictions: Impaired Restrictions Weight Bearing Restrictions Per Provider Order: No       Mobility Bed  Mobility Overal bed mobility: Needs Assistance Bed Mobility: Supine to Sit     Supine to sit: Min assist     General bed mobility comments: A to lift trunk from sidelying    Transfers Overall transfer level: Needs assistance Equipment used: Rolling walker (2 wheels) Transfers: Bed to chair/wheelchair/BSC Sit to Stand: Min assist, From elevated surface           General transfer comment: patient able to perform SPT from EOB to recliner with cues for hand placement with walker, cues for pacing (which patient ignored), no lowering A needed     Balance Overall balance assessment: Needs assistance Sitting-balance support: Feet supported, No upper extremity supported Sitting balance-Leahy Scale: Fair     Standing balance support: Bilateral upper extremity supported, Reliant on assistive device for balance Standing balance-Leahy Scale: Fair                             ADL either performed or assessed with clinical judgement   ADL   Eating/Feeding: Set up                                     General ADL Comments: significant caoxing to participate, only engaged in simple transfer    Extremity/Trunk Assessment Upper Extremity Assessment Upper Extremity Assessment: Generalized weakness            Vision       Perception     Praxis     Communication Communication Communication: No apparent difficulties   Cognition Arousal: Alert  Behavior During Therapy: Flat affect Cognition: Difficult to assess (patient minimally engaging with OT, flat affect)                               Following commands: Intact        Cueing   Cueing Techniques: Verbal cues  Exercises      Shoulder Instructions       General Comments      Pertinent Vitals/ Pain       Pain Assessment Pain Assessment: No/denies pain  Home Living                                          Prior Functioning/Environment               Frequency  Min 2X/week        Progress Toward Goals  OT Goals(current goals can now be found in the care plan section)  Progress towards OT goals: Not progressing toward goals - comment (significant coaxing to participate, participation minimal)  Acute Rehab OT Goals Patient Stated Goal: to eat lunch OT Goal Formulation: With patient Time For Goal Achievement: 11/24/23 Potential to Achieve Goals: Fair ADL Goals Pt Will Perform Upper Body Dressing: sitting;with modified independence Pt Will Perform Lower Body Dressing: sit to/from stand;with supervision Pt Will Transfer to Toilet: ambulating;with supervision Pt Will Perform Toileting - Clothing Manipulation and hygiene: with modified independence;sitting/lateral leans Additional ADL Goal #1: Pt will verbalize plan to implement at least 1 learned falls prevention strategy.  Plan      Co-evaluation                 AM-PAC OT 6 Clicks Daily Activity     Outcome Measure   Help from another person eating meals?: None Help from another person taking care of personal grooming?: A Little Help from another person toileting, which includes using toliet, bedpan, or urinal?: A Lot Help from another person bathing (including washing, rinsing, drying)?: A Lot Help from another person to put on and taking off regular upper body clothing?: A Lot Help from another person to put on and taking off regular lower body clothing?: A Lot 6 Click Score: 15    End of Session Equipment Utilized During Treatment: Rolling walker (2 wheels)  OT Visit Diagnosis: Unsteadiness on feet (R26.81);History of falling (Z91.81);Muscle weakness (generalized) (M62.81)   Activity Tolerance Patient limited by fatigue   Patient Left in chair;with call bell/phone within reach;with chair alarm set   Nurse Communication Other (comment) (requesting new lunch)        Time: 8573-8555 OT Time Calculation (min): 18 min  Charges: OT General Charges $OT  Visit: 1 Visit OT Treatments $Self Care/Home Management : 8-22 mins  Rogers Clause, OT/L MSOT, 11/16/2023

## 2023-11-16 NOTE — Assessment & Plan Note (Addendum)
 Patient with stage IV metastatic pancreatic cancer.  Follow-up with oncology as outpatient.  I spoke with Dr. Jacobo yesterday.  I will give bilateral update on Monday if I do not hear back from the rehab appeal.

## 2023-11-16 NOTE — Assessment & Plan Note (Addendum)
 Last creatinine 0.91 with a GFR greater than 60

## 2023-11-16 NOTE — Assessment & Plan Note (Addendum)
 Replace on a daily basis.  Today's potassium 3.6

## 2023-11-16 NOTE — Assessment & Plan Note (Signed)
 On Lipitor

## 2023-11-16 NOTE — Assessment & Plan Note (Addendum)
 Last hemoglobin 9.4, ferritin elevated 6446.

## 2023-11-16 NOTE — Assessment & Plan Note (Signed)
 Physical therapy recommending rehab

## 2023-11-17 ENCOUNTER — Ambulatory Visit: Attending: Cardiology | Admitting: Cardiology

## 2023-11-17 DIAGNOSIS — E876 Hypokalemia: Secondary | ICD-10-CM | POA: Diagnosis not present

## 2023-11-17 DIAGNOSIS — I4891 Unspecified atrial fibrillation: Secondary | ICD-10-CM | POA: Diagnosis not present

## 2023-11-17 DIAGNOSIS — C259 Malignant neoplasm of pancreas, unspecified: Secondary | ICD-10-CM | POA: Diagnosis not present

## 2023-11-17 DIAGNOSIS — R531 Weakness: Secondary | ICD-10-CM | POA: Diagnosis not present

## 2023-11-17 NOTE — Plan of Care (Signed)
  Problem: Clinical Measurements: Goal: Ability to maintain clinical measurements within normal limits will improve Outcome: Progressing Goal: Diagnostic test results will improve Outcome: Progressing Goal: Cardiovascular complication will be avoided Outcome: Progressing   Problem: Coping: Goal: Level of anxiety will decrease Outcome: Progressing

## 2023-11-17 NOTE — TOC Progression Note (Signed)
 Transition of Care [facility]) - Progression Note    Patient Details  Name: Colleen [name] MRN: [id number] Date of Birth: December 12, 1945  Transition of Care [facility]) CM/SW Contact  Dalia GORMAN Fuse, RN Phone Number: 11/17/2023, 9:54 AM  Clinical Narrative:     TOC received call from Tammy at [facility] advising the SNF admission has been denied. TOC advised that she placed a call to [facility] yesterday at 12:34 AM and lvmm requesting additional information. TOC did not receive a follow-up call. TOC explained that the Hospital [facility] switched services on yesterday, and as a courtesy she called to see if there was any info on the reason for the denial. Tammy advised that she could make an exception and extend the P2P to today at 12:00 noon. MD should place call to Dr. Marcey Sar to complete the P2P.  TOC made covering MD and Covering TOC aware of P2P and deadline.      Barriers to Discharge: Other (must enter comment) (Awaiting family decision on bed offers)               Expected Discharge Plan and Services         Expected Discharge Date: 11/12/23                                     Social Drivers of Health (SDOH) Interventions SDOH Screenings   Food Insecurity: Food Insecurity Present (11/11/2023)  Housing: Low Risk  (11/11/2023)  Transportation Needs: No Transportation Needs (11/11/2023)  Recent Concern: Transportation Needs - Unmet Transportation Needs (10/01/2023)  Utilities: At Risk (11/11/2023)  Alcohol Screen: Low Risk  (12/02/2022)  Depression (PHQ2-9): Low Risk  (11/08/2023)  Financial Resource Strain: Low Risk  (12/02/2022)  Physical Activity: Insufficiently Active (12/02/2022)  Social Connections: Socially Isolated (11/11/2023)  Stress: No Stress Concern Present (12/02/2022)  Tobacco Use: Medium Risk (11/14/2023)  Health Literacy: Adequate Health Literacy (12/02/2022)    Readmission Risk Interventions    10/28/2023    4:03 PM 10/03/2023    3:20 PM  Readmission Risk  Prevention Plan  Transportation Screening Complete Complete  Medication Review Oceanographer) Complete Complete  PCP or Specialist appointment within 3-5 days of discharge Complete Complete  HRI or Home Care Consult Complete   SW Recovery Care/Counseling Consult Complete Complete  Palliative Care Screening Not Applicable Not Applicable  Skilled Nursing Facility Not Applicable Not Applicable

## 2023-11-17 NOTE — Progress Notes (Signed)
 Progress Note   Patient: Colleen [name] FMW:[id number] DOB: July 21, 1945 DOA: 11/09/2023     7 DOS: the patient was seen and examined on 11/17/2023   Brief hospital course: Hospital course / significant events:   Colleen [name] is a 78 y.o. female with medical history significant of metastasized stage IV pancreatic cancer on chemotherapy, HTN, HLD, prediabetes, COPD/asthma, gout, GERD, CKD-3, anemia, A-fib and PE not on Eliquis , lumbar spinal stenosis, who presents with weakness   HPI:  weakness worsening over 1 week. She has poor appetite and decreased oral intake with generalized weakness. Pt states that she was trying to get to the kitchen to make something to eat however she was feeling very weak and lowered herself down to the ground. No fall. Of note, was recently hospitalized from 10/2 - 10/10 due to SIRS due to worsening anemia.  Hemoglobin down to 6.8, received 1 unit of blood transfusion.  Eliquis  was discontinued as recommended by cardiologist.   10/15: admitted for Afib RVR, HR improved w/o intervention.  10/16: still weak, PT/OT recs for SNF, TOC consult placed, see TOC note today.  10/17-10/21: awaiting placement  10/22.  As per [facility] awaiting insurance authorization for rehab. 10/23.  Peer-to-peer done with physician representing insurance company.  Consultants:  none  Procedures/Surgeries: none            Assessment and Plan: * Atrial fibrillation with RVR (HCC) Continue amiodarone .  Not on anticoagulation.  Heart rates improved  Primary pancreatic cancer with metastasis to other site [facility]) Patient with stage IV metastatic pancreatic cancer.  Follow-up with oncology as outpatient.  Myocardial injury Likely from fast heart rate.  Unable to tolerate aspirin .  On Lipitor.  Generalized weakness Physical therapy recommending rehab  Hypotension Continue midodrine  to maintain blood pressure.  Hypokalemia Replaced  Hyperlipidemia, unspecified On  Lipitor  Hypophosphatemia Replaced  Chronic kidney disease, stage 3a (HCC) Last creatinine is 0.62 with a GFR greater than 60  Normocytic anemia Last hemoglobin 8.2, ferritin elevated 6446.  Obesity (BMI 30-39.9) BMI 30.8           Subjective: Patient feeling okay.  Walked 20 feet with physical therapy today.  Admitted with rapid atrial fibrillation.  Physical Exam: Vitals:   11/16/23 1750 11/16/23 1957 11/17/23 0426 11/17/23 0754  BP: 117/69 (!) 114/58 (!) 93/47 106/61  Pulse: 60 100  (!) 49  Resp: 16 18 18 17   Temp: 97.7 F (36.5 C) 97.9 F (36.6 C) 98.2 F (36.8 C) 98.1 F (36.7 C)  TempSrc:      SpO2: 100% 98% 97% 98%  Weight:      Height:       Physical Exam HENT:     Head: Normocephalic.  Eyes:     General: Lids are normal.     Conjunctiva/sclera: Conjunctivae normal.  Cardiovascular:     Rate and Rhythm: Normal rate and regular rhythm.     Heart sounds: Normal heart sounds, S1 normal and S2 normal.  Pulmonary:     Breath sounds: No decreased breath sounds, wheezing, rhonchi or rales.  Abdominal:     Palpations: Abdomen is soft.     Tenderness: There is no abdominal tenderness.  Musculoskeletal:     Right lower leg: Swelling present.     Left lower leg: Swelling present.  Skin:    General: Skin is warm.     Findings: No rash.  Neurological:     Mental Status: She is alert and oriented to person, place,  and time.     Data Reviewed: Creatinine 0.62, electrolytes normal range, white blood cell count 10.4, hemoglobin 8.2, platelet count 165  Family Communication: Niece  Disposition: Status is: Inpatient Remains inpatient appropriate because: Today peer-to-peer with insurance company physician but I did not hear back on the results  Planned Discharge Destination: Rehab    Time spent: 28 minutes  Author: Charlie Patterson, MD 11/17/2023 4:14 PM  For on call review [email or web address].

## 2023-11-17 NOTE — Progress Notes (Signed)
 Physical Therapy Treatment Patient Details Name: Colleen [name] MRN: [id number] DOB: 1945/02/04 Today's Date: 11/17/2023   History of Present Illness Patient is a 78 year old female with weakness, A-fib with RVR. Recent hospitalization with SIRS due to worsening anemia. PMH: metastasized stage IV pancreatic cancer on chemotherapy, HTN, HLD, prediabetes, COPD/asthma, gout, GERD, CKD-3, anemia, A-fib and PE not on Eliquis , lumbar spinal stenosis    PT Comments  Patient agreeable to PT session. Education on the importance of routine mobility, sitting up with staff assistance for upright conditioning. Mod A required for bed mobility. Min A for initial stand with increased support required with subsequent transfers due to fatigue. Patient progressed ambulation compared to prior session. She walked 76ft with rolling walker with steadying assistance provided. Dizziness reported with mobility with MAP of 63. The patient is deconditioned and would benefit from continued PT to maximize independence. Her goal is for short term rehab with eventual return to her own home. Rehabilitation < 3 hours/day recommended after this hospital stay.    If plan is discharge home, recommend the following: A little help with walking and/or transfers;A little help with bathing/dressing/bathroom   Can travel by private vehicle     No  Equipment Recommendations  None recommended by PT    Recommendations for Other Services       Precautions / Restrictions Precautions Precautions: Fall Recall of Precautions/Restrictions: Impaired Restrictions Weight Bearing Restrictions Per Provider Order: No     Mobility  Bed Mobility Overal bed mobility: Needs Assistance Bed Mobility: Supine to Sit, Sit to Supine Rolling: Min assist   Supine to sit: Mod assist Sit to supine: Mod assist   General bed mobility comments: assistance for LE and trunk support for bed mobility. cues for task initiation.    Transfers Overall  transfer level: Needs assistance Equipment used: Rolling walker (2 wheels), 1 person hand held assist Transfers: Sit to/from Stand, Bed to chair/wheelchair/BSC Sit to Stand: Min assist Stand pivot transfers: Mod assist         General transfer comment: several transfer performed. patient required Min A for standing from bed with rolling walker. stand pivot transfer performed from bed side commode to recliner chair and recliner chair to bed. increased assistance with repeated transfers due to fatigue. she does reports dizziness with activity with MAP of 63    Ambulation/Gait Ambulation/Gait assistance: Min assist Gait Distance (Feet): 20 Feet Assistive device: Rolling walker (2 wheels) Gait Pattern/deviations: Step-through pattern, Trunk flexed Gait velocity: decreased     General Gait Details: slow cadence with steadying assistance required. encouragement provided. safety cues needed throughout ambulation bout   Stairs             Wheelchair Mobility     Tilt Bed    Modified Rankin (Stroke Patients Only)       Balance Overall balance assessment: Needs assistance Sitting-balance support: Feet supported, No upper extremity supported Sitting balance-Leahy Scale: Fair     Standing balance support: Bilateral upper extremity supported, Reliant on assistive device for balance Standing balance-Leahy Scale: Poor Standing balance comment: extenal support required to maintain standing balance with heavy realince on rolling walker for walking                            Communication Communication Communication: No apparent difficulties  Cognition Arousal: Alert Behavior During Therapy: Flat affect   PT - Cognitive impairments: No apparent impairments  PT - Cognition Comments: needs encouragement but agreeable to mobilize Following commands: Intact      Cueing Cueing Techniques: Verbal cues  Exercises      General  Comments General comments (skin integrity, edema, etc.): breif saturated with urine. patient had a bowel movement in the bathroom, seated on bed side commode over the toilet. maximal assistance required for pericare following bowel movement. patient prefers to have brief on at end of session      Pertinent Vitals/Pain Pain Assessment Pain Assessment: No/denies pain    Home Living                          Prior Function            PT Goals (current goals can now be found in the care plan section) Acute Rehab PT Goals Patient Stated Goal: eventually to go home PT Goal Formulation: With patient Time For Goal Achievement: 11/24/23 Potential to Achieve Goals: Good Progress towards PT goals: Progressing toward goals    Frequency    Min 2X/week      PT Plan      Co-evaluation              AM-PAC PT 6 Clicks Mobility   Outcome Measure  Help needed turning from your back to your side while in a flat bed without using bedrails?: None Help needed moving from lying on your back to sitting on the side of a flat bed without using bedrails?: A Little Help needed moving to and from a bed to a chair (including a wheelchair)?: A Little Help needed standing up from a chair using your arms (e.g., wheelchair or bedside chair)?: A Lot Help needed to walk in hospital room?: A Lot Help needed climbing 3-5 steps with a railing? : Total 6 Click Score: 15    End of Session Equipment Utilized During Treatment: Gait belt Activity Tolerance: Patient limited by fatigue Patient left: in bed;with call bell/phone within reach;with bed alarm set   PT Visit Diagnosis: Muscle weakness (generalized) (M62.81);Unsteadiness on feet (R26.81)     Time: 9064-8992 PT Time Calculation (min) (ACUTE ONLY): 32 min  Charges:    $Therapeutic Activity: 23-37 mins PT General Charges $$ ACUTE PT VISIT: 1 Visit                    Colleen [name], PT, MPT    Colleen [name] 11/17/2023,  10:17 AM

## 2023-11-17 NOTE — TOC Progression Note (Signed)
 Transition of Care [facility]) - Progression Note    Patient Details  Name: Colleen [name] MRN: [id number] Date of Birth: 1945-08-16  Transition of [facility]) CM/SW Contact  Marinda Cooks, RN Phone Number: 11/17/2023, 4:23 PM  Clinical Narrative:    This CM spoke with pt introduced role and updated pt that her SNF/Rehab has been denied after the Peer to Peer was completed. This CM informed pt of her option to appeal, or have a new SNF/Rehab insurance auth submitted . Pt expressed adamantly she did not want to move forward with the appeal informing she does not want to stay in the hospital the amount of time it will take for the process to be completed. Pt informed she is open to having the insurance auth resubmitted with her new PT notes and shared if it's denied again she want to dc home with [facility] coordinated. This   This CM called and spoke with Tammy at [facility] [employer]  and informed pt wants to move forward with having a new ins auth submitted for SNF/Rehab. TOC will cont to follow dc planning / care coordination and update as applicable.      Barriers to Discharge: Other (must enter comment) (Awaiting family decision on bed offers)               Expected Discharge Plan and Services  SNF/Rehab vs [facility]        Expected Discharge Date: 11/12/23                                     Social Drivers of Health (SDOH) Interventions SDOH Screenings   Food Insecurity: Food Insecurity Present (11/11/2023)  Housing: Low Risk  (11/11/2023)  Transportation Needs: No Transportation Needs (11/11/2023)  Recent Concern: Transportation Needs - Unmet Transportation Needs (10/01/2023)  Utilities: At Risk (11/11/2023)  Alcohol Screen: Low Risk  (12/02/2022)  Depression (PHQ2-9): Low Risk  (11/08/2023)  Financial Resource Strain: Low Risk  (12/02/2022)  Physical Activity: Insufficiently Active (12/02/2022)  Social Connections: Socially Isolated (11/11/2023)  Stress: No Stress Concern  Present (12/02/2022)  Tobacco Use: Medium Risk (11/14/2023)  Health Literacy: Adequate Health Literacy (12/02/2022)    Readmission Risk Interventions    10/28/2023    4:03 PM 10/03/2023    3:20 PM  Readmission Risk Prevention Plan  Transportation Screening Complete Complete  Medication Review Oceanographer) Complete Complete  PCP or Specialist appointment within 3-5 days of discharge Complete Complete  HRI or Home Care Consult Complete   SW Recovery Care/Counseling Consult Complete Complete  Palliative Care Screening Not Applicable Not Applicable  Skilled Nursing Facility Not Applicable Not Applicable

## 2023-11-18 ENCOUNTER — Encounter: Payer: Self-pay | Admitting: Cardiology

## 2023-11-18 DIAGNOSIS — I4891 Unspecified atrial fibrillation: Secondary | ICD-10-CM | POA: Diagnosis not present

## 2023-11-18 DIAGNOSIS — R531 Weakness: Secondary | ICD-10-CM | POA: Diagnosis not present

## 2023-11-18 DIAGNOSIS — I9589 Other hypotension: Secondary | ICD-10-CM | POA: Diagnosis not present

## 2023-11-18 DIAGNOSIS — E876 Hypokalemia: Secondary | ICD-10-CM | POA: Diagnosis not present

## 2023-11-18 LAB — BASIC METABOLIC PANEL WITH GFR
Anion gap: 7 (ref 5–15)
BUN: 12 mg/dL (ref 8–23)
CO2: 24 mmol/L (ref 22–32)
Calcium: 7.9 mg/dL — ABNORMAL LOW (ref 8.9–10.3)
Chloride: 107 mmol/L (ref 98–111)
Creatinine, Ser: 0.85 mg/dL (ref 0.44–1.00)
GFR, Estimated: >60 mL/min (ref >60)
Glucose, Bld: 95 mg/dL (ref 70–99)
Potassium: 3.1 mmol/L — ABNORMAL LOW (ref 3.5–5.1)
Sodium: 138 mmol/L (ref 135–145)

## 2023-11-18 LAB — CBC
HCT: 24.6 % — ABNORMAL LOW (ref 36.0–46.0)
Hemoglobin: 8.2 g/dL — ABNORMAL LOW (ref 12.0–15.0)
MCH: 30.5 pg (ref 26.0–34.0)
MCHC: 33.3 g/dL (ref 30.0–36.0)
MCV: 91.4 fL (ref 80.0–100.0)
Platelets: 174 K/uL (ref 150–400)
RBC: 2.69 MIL/uL — ABNORMAL LOW (ref 3.87–5.11)
RDW: 18.3 % — ABNORMAL HIGH (ref 11.5–15.5)
WBC: 12.3 K/uL — ABNORMAL HIGH (ref 4.0–10.5)
nRBC: 0.7 % — ABNORMAL HIGH (ref 0.0–0.2)

## 2023-11-18 MED ORDER — ATORVASTATIN CALCIUM 10 MG PO TABS: 10.0000 mg | ORAL_TABLET | Freq: Every day | ORAL | 0 refills | Status: DC

## 2023-11-18 MED ORDER — MIDODRINE HCL 5 MG PO TABS: 5.0000 mg | ORAL_TABLET | Freq: Three times a day (TID) | ORAL | 0 refills | Status: DC

## 2023-11-18 MED ORDER — POTASSIUM CHLORIDE CRYS ER 20 MEQ PO TBCR
40.0000 meq | EXTENDED_RELEASE_TABLET | Freq: Once | ORAL | Status: AC
  Administered 2023-11-18: 40 meq via ORAL
  Filled 2023-11-18: qty 2

## 2023-11-18 MED ORDER — MIDODRINE HCL 5 MG PO TABS
10.0000 mg | ORAL_TABLET | Freq: Three times a day (TID) | ORAL | Status: DC
  Administered 2023-11-18 – 2023-11-22 (×11): 10 mg via ORAL
  Filled 2023-11-18 (×11): qty 2

## 2023-11-18 MED ORDER — POTASSIUM CHLORIDE CRYS ER 20 MEQ PO TBCR: EXTENDED_RELEASE_TABLET | ORAL | 0 refills | Status: DC

## 2023-11-18 MED ORDER — POLYETHYLENE GLYCOL 3350 17 G PO PACK: 17.0000 g | PACK | Freq: Two times a day (BID) | ORAL | 0 refills | Status: DC | PRN

## 2023-11-18 MED ORDER — MAGNESIUM CHLORIDE 64 MG PO TBEC: 1.0000 | DELAYED_RELEASE_TABLET | Freq: Every day | ORAL | 0 refills | Status: DC

## 2023-11-18 NOTE — Plan of Care (Signed)
  Problem: Clinical Measurements: Goal: Ability to maintain clinical measurements within normal limits will improve Outcome: Progressing Goal: Respiratory complications will improve Outcome: Progressing   Problem: Elimination: Goal: Will not experience complications related to bowel motility Outcome: Progressing   Problem: Safety: Goal: Ability to remain free from injury will improve Outcome: Progressing

## 2023-11-18 NOTE — Progress Notes (Signed)
 PT Cancellation Note  Patient Details Name: Colleen [name] MRN: [id number] DOB: 1945-07-10   Cancelled Treatment:     PT attempt. Pt endorses being soiled in BM. Author offered to assist however pt politely requested females assistance. Acute PT will continue to follow and progress per current POC.     Rankin KATHEE Essex 11/18/2023, 3:01 PM

## 2023-11-18 NOTE — Progress Notes (Signed)
 Progress Note   Patient: Colleen [name] FMW:[id number] DOB: 01-20-1946 DOA: 11/09/2023     8 DOS: the patient was seen and examined on 11/18/2023   Brief hospital course: Hospital course / significant events:   Colleen [name] is a 78 y.o. female with medical history significant of metastasized stage IV pancreatic cancer on chemotherapy, HTN, HLD, prediabetes, COPD/asthma, gout, GERD, CKD-3, anemia, A-fib and PE not on Eliquis , lumbar spinal stenosis, who presents with weakness   HPI:  weakness worsening over 1 week. She has poor appetite and decreased oral intake with generalized weakness. Pt states that she was trying to get to the kitchen to make something to eat however she was feeling very weak and lowered herself down to the ground. No fall. Of note, was recently hospitalized from 10/2 - 10/10 due to SIRS due to worsening anemia.  Hemoglobin down to 6.8, received 1 unit of blood transfusion.  Eliquis  was discontinued as recommended by cardiologist.   10/15: admitted for Afib RVR, HR improved w/o intervention.  10/16: still weak, PT/OT recs for SNF, TOC consult placed, see TOC note today.  10/17-10/21: awaiting placement  10/22.  As per [facility] awaiting insurance authorization for rehab. 10/23.  Peer-to-peer done with physician representing insurance company. 10/24.  Patient did not feel well with standing up today.  Unsafe to go home currently.  Family to appeal rehab denial.  Consultants:  none  Procedures/Surgeries: none            Assessment and Plan: * Atrial fibrillation with RVR (HCC) Continue amiodarone .  Not on anticoagulation.  Heart rates improved.  Hypotension Will increase midodrine  to 10 mg 3 times daily  Hypokalemia Replace on a daily basis  Primary pancreatic cancer with metastasis to other site [facility]) Patient with stage IV metastatic pancreatic cancer.  Follow-up with oncology as outpatient.  Generalized weakness Physical therapy recommending  rehab  Hyperlipidemia, unspecified On Lipitor  Hypophosphatemia Replaced  Myocardial injury Likely from fast heart rate.  Unable to tolerate aspirin .  On Lipitor.  Chronic kidney disease, stage 3a (HCC) Last creatinine is 0.62 with a GFR greater than 60  Normocytic anemia Last hemoglobin 8.2, ferritin elevated 6446.  Obesity (BMI 30-39.9) BMI 30.8        Subjective: Patient did not feel well with standing up today.  Needed help to get into a wheelchair.  Initially admitted with rapid atrial fibrillation.  Physical Exam: Vitals:   11/17/23 1945 11/18/23 0434 11/18/23 0944 11/18/23 1240  BP: (!) 95/47 (!) 101/52 105/69 101/64  Pulse: 67 86 97   Resp: 17 19 18    Temp: 98.2 F (36.8 C) 97.6 F (36.4 C) 98 F (36.7 C)   TempSrc:   Oral   SpO2: 99% 97% 100%   Weight:      Height:       Physical Exam HENT:     Head: Normocephalic.  Eyes:     General: Lids are normal.     Conjunctiva/sclera: Conjunctivae normal.  Cardiovascular:     Rate and Rhythm: Normal rate and regular rhythm.     Heart sounds: Normal heart sounds, S1 normal and S2 normal.  Pulmonary:     Breath sounds: No decreased breath sounds, wheezing, rhonchi or rales.  Abdominal:     Palpations: Abdomen is soft.     Tenderness: There is no abdominal tenderness.  Musculoskeletal:     Right lower leg: Swelling present.     Left lower leg: Swelling present.  Skin:  General: Skin is warm.     Findings: No rash.  Neurological:     Mental Status: She is alert and oriented to person, place, and time.     Data Reviewed: Potassium 3.1, creatinine 0.85, white blood cell count 12.3, hemoglobin 8.2, platelet count 174  Family Communication: Niece on the phone and at the bedside  Disposition: Status is: Inpatient Remains inpatient appropriate because: Unsafe discharge home.  Family to appeal insurance decision on rehab  Planned Discharge Destination: Rehab    Time spent: 28  minutes  Author: Charlie Patterson, MD 11/18/2023 2:56 PM  For on call review [email or web address].

## 2023-11-19 DIAGNOSIS — J439 Emphysema, unspecified: Secondary | ICD-10-CM

## 2023-11-19 DIAGNOSIS — I4891 Unspecified atrial fibrillation: Secondary | ICD-10-CM | POA: Diagnosis not present

## 2023-11-19 DIAGNOSIS — R531 Weakness: Secondary | ICD-10-CM | POA: Diagnosis not present

## 2023-11-19 DIAGNOSIS — I951 Orthostatic hypotension: Secondary | ICD-10-CM | POA: Diagnosis not present

## 2023-11-19 DIAGNOSIS — E876 Hypokalemia: Secondary | ICD-10-CM | POA: Diagnosis not present

## 2023-11-19 LAB — BASIC METABOLIC PANEL WITH GFR
Anion gap: 9 (ref 5–15)
BUN: 13 mg/dL (ref 8–23)
CO2: 23 mmol/L (ref 22–32)
Calcium: 8.1 mg/dL — ABNORMAL LOW (ref 8.9–10.3)
Chloride: 108 mmol/L (ref 98–111)
Creatinine, Ser: 0.83 mg/dL (ref 0.44–1.00)
GFR, Estimated: >60 mL/min (ref >60)
Glucose, Bld: 87 mg/dL (ref 70–99)
Potassium: 3.4 mmol/L — ABNORMAL LOW (ref 3.5–5.1)
Sodium: 140 mmol/L (ref 135–145)

## 2023-11-19 LAB — HEMOGLOBIN: Hemoglobin: 8.4 g/dL — ABNORMAL LOW (ref 12.0–15.0)

## 2023-11-19 MED ORDER — SODIUM CHLORIDE 0.9 % IV BOLUS
500.0000 mL | Freq: Once | INTRAVENOUS | Status: AC
  Administered 2023-11-19: 500 mL via INTRAVENOUS

## 2023-11-19 MED ORDER — POTASSIUM CHLORIDE CRYS ER 20 MEQ PO TBCR: 40.0000 meq | EXTENDED_RELEASE_TABLET | Freq: Every day | ORAL | Status: DC

## 2023-11-19 MED ORDER — POTASSIUM CHLORIDE CRYS ER 20 MEQ PO TBCR
40.0000 meq | EXTENDED_RELEASE_TABLET | Freq: Every day | ORAL | Status: DC
  Administered 2023-11-19 – 2023-12-01 (×13): 40 meq via ORAL
  Filled 2023-11-19 (×13): qty 2

## 2023-11-19 NOTE — Progress Notes (Signed)
 Physical Therapy Treatment Patient Details Name: Colleen [name] MRN: [id number] DOB: 03-22-1945 Today's Date: 11/19/2023   History of Present Illness Patient is a 78 year old female with weakness, A-fib with RVR. Recent hospitalization with SIRS due to worsening anemia. PMH: metastasized stage IV pancreatic cancer on chemotherapy, HTN, HLD, prediabetes, COPD/asthma, gout, GERD, CKD-3, anemia, A-fib and PE not on Eliquis , lumbar spinal stenosis    PT Comments  Pt alert but resistive to participation in PT tx this date. Pt was received in bed, stated she had been in and out of bed all day and had just received a bath with nursing. Pt educated on importance of participation with therapy to maintain functional status and progress toward goals, pt adamantly declined OOB mobility. Pt was agreeable to participate in BLE exercises in bed, performed a variety of resisted and AROM exercises below with good tolerance, no reports of pain throughout. BP at start of session 115/75 (87). Author assisted pt with setting up food tray and cutting chicken for pt to eat lunch. Pt was left as she was received, all needs in reach. The patient would benefit from further skilled PT intervention to continue to progress towards goals.      If plan is discharge home, recommend the following: A little help with walking and/or transfers;A little help with bathing/dressing/bathroom   Can travel by private vehicle     No  Equipment Recommendations  Other (comment) (TBD)    Recommendations for Other Services       Precautions / Restrictions Precautions Precautions: Fall Recall of Precautions/Restrictions: Impaired Restrictions Weight Bearing Restrictions Per Provider Order: No     Mobility  Bed Mobility               General bed mobility comments: NT, pt refused OOB activity this session    Transfers                        Ambulation/Gait                   Stairs              Wheelchair Mobility     Tilt Bed    Modified Rankin (Stroke Patients Only)       Balance                                            Communication Communication Communication: No apparent difficulties  Cognition Arousal: Alert Behavior During Therapy: Flat affect   PT - Cognitive impairments: No apparent impairments                       PT - Cognition Comments: needs max encouragement to participate in mobility Following commands: Intact      Cueing Cueing Techniques: Verbal cues, Tactile cues, Visual cues  Exercises General Exercises - Lower Extremity Ankle Circles/Pumps: AROM, Both, 10 reps, Supine Short Arc Quad: AROM, Both, 10 reps, Supine Heel Slides: AROM, Both, 10 reps, Supine Hip ABduction/ADduction: AROM, Both, 10 reps, Supine Other Exercises Other Exercises: BP assessed at beginning of session: 115/75 (87), pt refused orthostatic vital signs Other Exercises: Pt performed 10 repetitions of BLE resisted hip extension (leg press) and hip abduction. Additional 10 reps of hip adduction towel squeeze isometric x10    General Comments  Pertinent Vitals/Pain Pain Assessment Pain Assessment: No/denies pain    Home Living                          Prior Function            PT Goals (current goals can now be found in the care plan section) Progress towards PT goals: Progressing toward goals    Frequency    Min 2X/week      PT Plan      Co-evaluation              AM-PAC PT 6 Clicks Mobility   Outcome Measure  Help needed turning from your back to your side while in a flat bed without using bedrails?: None Help needed moving from lying on your back to sitting on the side of a flat bed without using bedrails?: A Little Help needed moving to and from a bed to a chair (including a wheelchair)?: A Little Help needed standing up from a chair using your arms (e.g., wheelchair or bedside chair)?: A  Lot Help needed to walk in hospital room?: A Lot Help needed climbing 3-5 steps with a railing? : Total 6 Click Score: 15    End of Session   Activity Tolerance: Patient limited by fatigue Patient left: in bed;with call bell/phone within reach;with bed alarm set Nurse Communication: Mobility status PT Visit Diagnosis: Muscle weakness (generalized) (M62.81);Unsteadiness on feet (R26.81)     Time: 8653-8584 PT Time Calculation (min) (ACUTE ONLY): 29 min  Charges:    $Therapeutic Exercise: 23-37 mins PT General Charges $$ ACUTE PT VISIT: 1 Visit                     Janell Axe, SPT

## 2023-11-19 NOTE — Plan of Care (Signed)
  Problem: Health Behavior/Discharge Planning: Goal: Ability to manage health-related needs will improve Outcome: Progressing   Problem: Clinical Measurements: Goal: Will remain free from infection Outcome: Progressing   Problem: Nutrition: Goal: Adequate nutrition will be maintained Outcome: Progressing   Problem: Pain Managment: Goal: General experience of comfort will improve and/or be controlled Outcome: Progressing

## 2023-11-19 NOTE — Progress Notes (Signed)
 Progress Note   Patient: Colleen [name] DOB: [birth year] DOA: 11/09/2023     9 DOS: the patient was seen and examined on 11/19/2023   Brief hospital course: Hospital course / significant events:   Colleen [name] is a 78 y.o. female with medical history significant of metastasized stage IV pancreatic cancer on chemotherapy, HTN, HLD, prediabetes, COPD/asthma, gout, GERD, CKD-3, anemia, A-fib and PE not on Eliquis , lumbar spinal stenosis, who presents with weakness   HPI:  weakness worsening over 1 week. She has poor appetite and decreased oral intake with generalized weakness. Pt states that she was trying to get to the kitchen to make something to eat however she was feeling very weak and lowered herself down to the ground. No fall. Of note, was recently hospitalized from 10/2 - 10/10 due to SIRS due to worsening anemia.  Hemoglobin down to 6.8, received 1 unit of blood transfusion.  Eliquis  was discontinued as recommended by cardiologist.   10/15: admitted for Afib RVR, HR improved w/o intervention.  10/16: still weak, PT/OT recs for SNF, TOC consult placed, see TOC note today.  10/17-10/21: awaiting placement  10/22.  As per [facility] awaiting insurance authorization for rehab. 10/23.  Peer-to-peer done with physician representing insurance company. 10/24.  Patient did not feel well with standing up today.  Unsafe to go home currently.  Midodrine  increased to 10 mg 3 times daily 10/25.  Patient orthostatic.  Will give a fluid bolus.  Family to appeal rehab denial  Consultants:  none  Procedures/Surgeries: none            Assessment and Plan: * Orthostatic hypotension Midodrine  increased to 10 mg 3 times daily.  Will give fluid bolus.  May end up having to start Florinef.  Atrial fibrillation with RVR (HCC) Continue amiodarone .  Not on anticoagulation.  Heart rates improved.  Hypokalemia Replace on a daily basis  Primary pancreatic cancer with metastasis to  other site [facility]) Patient with stage IV metastatic pancreatic cancer.  Follow-up with oncology as outpatient.  I spoke with Dr. Jacobo today.  Generalized weakness Physical therapy recommending rehab  Hyperlipidemia, unspecified On Lipitor  Hypophosphatemia Replaced  Myocardial injury Likely from fast heart rate.  Unable to tolerate aspirin .  On Lipitor.  Chronic kidney disease, stage 3a (HCC) Last creatinine 0.83 with a GFR greater than 60  Normocytic anemia Last hemoglobin 8.4, ferritin elevated 6446.  Obesity (BMI 30-39.9) BMI 30.8        Subjective: Patient did not feel well when trying to get up yesterday.  I increase midodrine  to 10 mg 3 times daily.  Patient orthostatic today.  Will give a fluid bolus.  Physical Exam: Vitals:   11/19/23 1156 11/19/23 1158 11/19/23 1206 11/19/23 1527  BP: 120/61 113/66 (!) 77/40 120/65  Pulse: (!) 59 (!) 55 (!) 59 (!) 55  Resp: 16 14 18 14   Temp: (!) 97 F (36.1 C) 97.8 F (36.6 C) 97.8 F (36.6 C) 97.6 F (36.4 C)  TempSrc:      SpO2: 100% 100% 100% 100%  Weight:      Height:       Physical Exam HENT:     Head: Normocephalic.  Eyes:     General: Lids are normal.     Conjunctiva/sclera: Conjunctivae normal.  Cardiovascular:     Rate and Rhythm: Normal rate and regular rhythm.     Heart sounds: Normal heart sounds, S1 normal and S2 normal.  Pulmonary:  Breath sounds: No decreased breath sounds, wheezing, rhonchi or rales.  Abdominal:     Palpations: Abdomen is soft.     Tenderness: There is no abdominal tenderness.  Musculoskeletal:     Right lower leg: Swelling present.     Left lower leg: Swelling present.  Skin:    General: Skin is warm.     Findings: No rash.  Neurological:     Mental Status: She is alert and oriented to person, place, and time.     Data Reviewed: Potassium 3.4, creatinine 0.83  Family Communication: Informed niece that insurance company denied rehab.  She will start the appeal  process.  Disposition: Status is: Inpatient Remains inpatient appropriate because: Patient's niece will start the appeal process for rehab  Planned Discharge Destination: Skilled nursing facility    Time spent: 28 minutes  Author: Charlie Patterson, MD 11/19/2023 3:47 PM  For on call review [email or web address].

## 2023-11-20 DIAGNOSIS — I4891 Unspecified atrial fibrillation: Secondary | ICD-10-CM | POA: Diagnosis not present

## 2023-11-20 DIAGNOSIS — R531 Weakness: Secondary | ICD-10-CM | POA: Diagnosis not present

## 2023-11-20 DIAGNOSIS — E876 Hypokalemia: Secondary | ICD-10-CM | POA: Diagnosis not present

## 2023-11-20 DIAGNOSIS — I951 Orthostatic hypotension: Secondary | ICD-10-CM | POA: Diagnosis not present

## 2023-11-20 LAB — CREATININE, SERUM
Creatinine, Ser: 0.78 mg/dL (ref 0.44–1.00)
GFR, Estimated: >60 mL/min (ref >60)

## 2023-11-20 LAB — MAGNESIUM: Magnesium: 1.7 mg/dL (ref 1.7–2.4)

## 2023-11-20 LAB — POTASSIUM: Potassium: 3.3 mmol/L — ABNORMAL LOW (ref 3.5–5.1)

## 2023-11-20 LAB — HEMOGLOBIN: Hemoglobin: 8.6 g/dL — ABNORMAL LOW (ref 12.0–15.0)

## 2023-11-20 MED ORDER — MAGNESIUM SULFATE 2 GM/50ML IV SOLN
2.0000 g | Freq: Once | INTRAVENOUS | Status: AC
  Administered 2023-11-20: 2 g via INTRAVENOUS
  Filled 2023-11-20: qty 50

## 2023-11-20 NOTE — Progress Notes (Signed)
 Progress Note   Patient: Colleen [name] FMW:[id number] DOB: 1945/05/06 DOA: 11/09/2023     10 DOS: the patient was seen and examined on 11/20/2023   Brief hospital course: Hospital course / significant events:   SOLINA HERON is a 78 y.o. female with medical history significant of metastasized stage IV pancreatic cancer on chemotherapy, HTN, HLD, prediabetes, COPD/asthma, gout, GERD, CKD-3, anemia, A-fib and PE not on Eliquis , lumbar spinal stenosis, who presents with weakness   HPI:  weakness worsening over 1 week. She has poor appetite and decreased oral intake with generalized weakness. Pt states that she was trying to get to the kitchen to make something to eat however she was feeling very weak and lowered herself down to the ground. No fall. Of note, was recently hospitalized from 10/2 - 10/10 due to SIRS due to worsening anemia.  Hemoglobin down to 6.8, received 1 unit of blood transfusion.  Eliquis  was discontinued as recommended by cardiologist.   10/15: admitted for Afib RVR, HR improved w/o intervention.  10/16: still weak, PT/OT recs for SNF, TOC consult placed, see TOC note today.  10/17-10/21: awaiting placement  10/22.  As per [facility] awaiting insurance authorization for rehab. 10/23.  Peer-to-peer done with physician representing insurance company. 10/24.  Patient did not feel well with standing up today.  Unsafe to go home currently.  Midodrine  increased to 10 mg 3 times daily 10/25.  Patient orthostatic.  Will give a fluid bolus.  Family to appeal rehab denial 10/26.  Still waiting insurance company decision on appeal.  Consultants:  none  Procedures/Surgeries: none            Assessment and Plan: * Orthostatic hypotension Continue midodrine  increased to 10 mg 3 times daily.  Received fluid bolus yesterday.  Atrial fibrillation with RVR (HCC) Continue amiodarone .  Not on anticoagulation.  Heart rates improved.  Hypokalemia Replace on a daily  basis  Primary pancreatic cancer with metastasis to other site [facility]) Patient with stage IV metastatic pancreatic cancer.  Follow-up with oncology as outpatient.  I spoke with Dr. Jacobo yesterday.  I will give bilateral update on Monday if I do not hear back from the rehab appeal.  Generalized weakness Physical therapy recommending rehab  Hyperlipidemia, unspecified On Lipitor  Hypophosphatemia Replaced  Myocardial injury Likely from fast heart rate.  Unable to tolerate aspirin .  On Lipitor.  Chronic kidney disease, stage 3a (HCC) Last creatinine 0.78 with a GFR greater than 60  Normocytic anemia Last hemoglobin 8.6, ferritin elevated 6446.  Obesity (BMI 30-39.9) BMI 30.8  Hypomagnesemia Replace magnesium         Subjective: Patient upset about being in the hospital so long.  Patient upset about not being able to go out to rehab.  States she needs to get to that appointment on Tuesday with Dr. Jacobo.  Upset that I gave her fluid yesterday and she urinated a lot last night.  She did not feel lightheaded or dizzy with getting up to go to the bathroom.  Admitted 10 days ago with rapid A-fib  Physical Exam: Vitals:   11/19/23 2050 11/19/23 2053 11/20/23 0412 11/20/23 1041  BP: 102/72 109/88 (!) 111/55 (!) 95/54  Pulse: 86 95 66 70  Resp: 18 20 18 16   Temp: 98.1 F (36.7 C) 98.1 F (36.7 C) 97.9 F (36.6 C) 98.1 F (36.7 C)  TempSrc: Oral Oral    SpO2:   98% 99%  Weight:      Height:  Physical Exam HENT:     Head: Normocephalic.  Eyes:     General: Lids are normal.     Conjunctiva/sclera: Conjunctivae normal.  Cardiovascular:     Rate and Rhythm: Normal rate and regular rhythm.     Heart sounds: Normal heart sounds, S1 normal and S2 normal.  Pulmonary:     Breath sounds: No decreased breath sounds, wheezing, rhonchi or rales.  Abdominal:     Palpations: Abdomen is soft.     Tenderness: There is no abdominal tenderness.  Musculoskeletal:     Right  lower leg: Swelling present.     Left lower leg: Swelling present.  Skin:    General: Skin is warm.     Findings: No rash.  Neurological:     Mental Status: She is alert and oriented to person, place, and time.     Data Reviewed: Potassium 3.3, creatinine 0.8, magnesium  1.7, GFR greater than 60, hemoglobin 8 point  Family Communication: Spoke with niece on the phone  Disposition: Status is: Inpatient Remains inpatient appropriate because: Awaiting to hear back on appeal for rehab  Planned Discharge Destination: Rehab    Time spent: 28 minutes  Author: Charlie Patterson, MD 11/20/2023 1:20 PM  For on call review [email or web address].

## 2023-11-20 NOTE — Plan of Care (Signed)
  Problem: Activity: Goal: Risk for activity intolerance will decrease Outcome: Progressing   Problem: Pain Managment: Goal: General experience of comfort will improve and/or be controlled Outcome: Progressing   Problem: Safety: Goal: Ability to remain free from injury will improve Outcome: Progressing   Problem: Skin Integrity: Goal: Risk for impaired skin integrity will decrease Outcome: Progressing

## 2023-11-20 NOTE — Progress Notes (Signed)
 Physical Therapy Treatment Patient Details Name: Colleen [name] MRN: [id number] DOB: 1945/07/21 Today's Date: 11/20/2023   History of Present Illness Patient is a 78 year old female with weakness, A-fib with RVR. Recent hospitalization with SIRS due to worsening anemia. PMH: metastasized stage IV pancreatic cancer on chemotherapy, HTN, HLD, prediabetes, COPD/asthma, gout, GERD, CKD-3, anemia, A-fib and PE not on Eliquis , lumbar spinal stenosis    PT Comments  Co-tx with OT.  OT in room for session.  Attempting to get to [facility] but unable to stand.  Arrived in room for assist and she is able to stand with min a x 2 with good effort.  Pt stated she was fearful of falling but felt comfortable with +2 assist.  She takes several small steps to [facility] with min a x 2.  Assist needed to manage clothing.  Assisted OT with bathing as needed.  While sitting pt stated she feels progressively weaker and light headed  BP's have been variable in the past.  She is assisted and returns to bed with min a x 2 and BP is taken in supine 125/80  P 81 where she continues to endorse feeling weak.  Pt does have medium soft BM during session which is very pale and cream colored.  RN aware.    Pt is motivated and agreeable for therapy session but limited by general malaise,weakness and likely some orthostasis during session.  Will take orthostatic BP's next session as appropriate..   If plan is discharge home, recommend the following: A little help with walking and/or transfers;A little help with bathing/dressing/bathroom;Assistance with cooking/housework;Help with stairs or ramp for entrance   Can travel by private vehicle        Equipment Recommendations       Recommendations for Other Services       Precautions / Restrictions Precautions Precautions: Fall Recall of Precautions/Restrictions: Impaired Precaution/Restrictions Comments: ? orthostatic - may benefit from orthostatic BP's if continues Restrictions Weight  Bearing Restrictions Per Provider Order: No     Mobility  Bed Mobility Overal bed mobility: Needs Assistance Bed Mobility: Sit to Supine     Supine to sit: Min assist, +2 for physical assistance       Patient Response: Cooperative  Transfers Overall transfer level: Needs assistance Equipment used: Rolling walker (2 wheels) Transfers: Sit to/from Stand Sit to Stand: Min assist, +2 physical assistance           General transfer comment: pt uncomfortable with +1 assist but does better with +2    Ambulation/Gait Ambulation/Gait assistance: Min assist, +2 safety/equipment Gait Distance (Feet): 4 Feet Assistive device: Rolling walker (2 wheels) Gait Pattern/deviations: Step-through pattern, Decreased step length - right, Decreased step length - left Gait velocity: decreased     General Gait Details: slow and hunched over walker. Generally appears to feel poorly.   Stairs             Wheelchair Mobility     Tilt Bed Tilt Bed Patient Response: Cooperative  Modified Rankin (Stroke Patients Only)       Balance Overall balance assessment: Needs assistance Sitting-balance support: Feet supported, No upper extremity supported Sitting balance-Leahy Scale: Fair     Standing balance support: Bilateral upper extremity supported, Reliant on assistive device for balance Standing balance-Leahy Scale: Fair                              Communication Communication Communication: No apparent  difficulties  Cognition Arousal: Alert Behavior During Therapy: Flat affect   PT - Cognitive impairments: No apparent impairments                         Following commands: Intact, Impaired Following commands impaired: Follows one step commands with increased time    Cueing Cueing Techniques: Verbal cues, Tactile cues, Visual cues  Exercises      General Comments        Pertinent Vitals/Pain Pain Assessment Pain Assessment: No/denies pain     Home Living                          Prior Function            PT Goals (current goals can now be found in the care plan section) Progress towards PT goals: Not progressing toward goals - comment    Frequency    Min 2X/week      PT Plan      Co-evaluation PT/OT/SLP Co-Evaluation/Treatment: Yes Reason for Co-Treatment: For patient/therapist safety PT goals addressed during session: Mobility/safety with mobility;Proper use of DME OT goals addressed during session: ADL's and self-care      AM-PAC PT 6 Clicks Mobility   Outcome Measure  Help needed turning from your back to your side while in a flat bed without using bedrails?: A Little Help needed moving from lying on your back to sitting on the side of a flat bed without using bedrails?: A Little Help needed moving to and from a bed to a chair (including a wheelchair)?: A Little Help needed standing up from a chair using your arms (e.g., wheelchair or bedside chair)?: A Lot Help needed to walk in hospital room?: A Lot Help needed climbing 3-5 steps with a railing? : Total 6 Click Score: 14    End of Session Equipment Utilized During Treatment: Gait belt Activity Tolerance: Treatment limited secondary to medical complications (Comment);Patient limited by fatigue Patient left: in bed;with bed alarm set;with call bell/phone within reach Nurse Communication: Mobility status;Other (comment) PT Visit Diagnosis: Muscle weakness (generalized) (M62.81);Unsteadiness on feet (R26.81)     Time: 8857-8844 PT Time Calculation (min) (ACUTE ONLY): 13 min  Charges:    $Therapeutic Activity: 8-22 mins PT General Charges $$ ACUTE PT VISIT: 1 Visit                   Lauraine Gills, PTA 11/20/23, 1:20 PM

## 2023-11-20 NOTE — Progress Notes (Signed)
 Occupational Therapy Treatment Patient Details Name: Colleen [name] MRN: [id number] DOB: 1945-02-05 Today's Date: 11/20/2023   History of present illness Patient is a 78 year old female with weakness, A-fib with RVR. Recent hospitalization with SIRS due to worsening anemia. PMH: metastasized stage IV pancreatic cancer on chemotherapy, HTN, HLD, prediabetes, COPD/asthma, gout, GERD, CKD-3, anemia, A-fib and PE not on Eliquis , lumbar spinal stenosis   OT comments  Pt is supine in bed on arrival. Pleasant and agreeable to OT session. She denies pain. Pt is agreeable to routine ADL session for bathing and BSC use. Pt required Min/mod A x1 for bed mobility. Multiple attempts at STS from EOB with bed height elevated and cues for hand placement with 1 person assist, however unable to stand. Pt requesting a 2nd assist, PT called and joined in for session. Pt able to stand from EOB and SPT to [facility] with Min A x2, cues for safety/technique. Cont BM on BSC with bathing performed while seated. UB bathing/dressing with supervision to Min A and LB bathing/dressing performed with Max A. Pt became lightheaded and wished to return to bed vs recliner. BP monitored once back in bed and was 125/80 with pt symptoms resolved. She demo rolling with CGA/SBA and use of bed rail. Pt returned to bed with all needs in place and will cont to require skilled acute OT services to maximize her safety and IND to return to PLOF.       If plan is discharge home, recommend the following:  A lot of help with walking and/or transfers;A lot of help with bathing/dressing/bathroom;Assistance with cooking/housework;Assist for transportation;Help with stairs or ramp for entrance   Equipment Recommendations  Other (comment) (defer to next venue)    Recommendations for Other Services      Precautions / Restrictions Precautions Precautions: Fall Recall of Precautions/Restrictions: Impaired Precaution/Restrictions Comments: ? orthostatic -  may benefit from orthostatic BP's if continues Restrictions Weight Bearing Restrictions Per Provider Order: No       Mobility Bed Mobility Overal bed mobility: Needs Assistance Bed Mobility: Supine to Sit, Sit to Supine     Supine to sit: Min assist, HOB elevated, Used rails Sit to supine: Mod assist   General bed mobility comments: Mod A for BLE management to return to supine at end of session d/t lightheadedness    Transfers Overall transfer level: Needs assistance Equipment used: Rolling walker (2 wheels) Transfers: Sit to/from Stand Sit to Stand: Min assist, +2 physical assistance, From elevated surface           General transfer comment: attempted STS from EOB with 1 assist x3 attempts and pt requiring Max A and reporting she wished for 2 people; with 2 people only required Min A x2 to stand from EOB to RW; cues for hand placement and technique during transfers     Balance Overall balance assessment: Needs assistance Sitting-balance support: Feet supported, No upper extremity supported Sitting balance-Leahy Scale: Fair     Standing balance support: Bilateral upper extremity supported, Reliant on assistive device for balance Standing balance-Leahy Scale: Poor Standing balance comment: Min A to maintain standing balance with heavy UE support on RW                           ADL either performed or assessed with clinical judgement   ADL Overall ADL's : Needs assistance/impaired     Grooming: Sitting;Set up;Wash/dry face   Upper Body Bathing: Supervision/ safety;Sitting Upper  Body Bathing Details (indicate cue type and reason): on BSC Lower Body Bathing: Maximal assistance;Sitting/lateral leans;Sit to/from stand   Upper Body Dressing : Minimal assistance;Sitting Upper Body Dressing Details (indicate cue type and reason): gown management Lower Body Dressing: Sit to/from stand;Sitting/lateral leans;Maximal assistance Lower Body Dressing Details  (indicate cue type and reason): doff/donn pull-ups after bathing Toilet Transfer: Ambulation;Rolling walker (2 wheels);+2 for safety/equipment;Minimal assistance;+2 for physical assistance;BSC/3in1 Toilet Transfer Details (indicate cue type and reason): pt took a few steps and turned to sit on [facility] with Min A x2, cues for safety Toileting- Clothing Manipulation and Hygiene: Maximal assistance;Sit to/from stand Toileting - Clothing Manipulation Details (indicate cue type and reason): Max A for standing peri-care and pull up management            Extremity/Trunk Assessment              Vision       Perception     Praxis     Communication Communication Communication: No apparent difficulties   Cognition Arousal: Alert Behavior During Therapy: Flat affect                                 Following commands: Intact, Impaired Following commands impaired: Follows one step commands with increased time      Cueing   Cueing Techniques: Verbal cues, Tactile cues, Visual cues  Exercises      Shoulder Instructions       General Comments BP 125/80 with return to supine however pt became lightheaded while seated on BSC after BM-nurse aware    Pertinent Vitals/ Pain       Pain Assessment Pain Assessment: No/denies pain  Home Living                                          Prior Functioning/Environment              Frequency  Min 2X/week        Progress Toward Goals  OT Goals(current goals can now be found in the care plan section)  Progress towards OT goals: Progressing toward goals  Acute Rehab OT Goals Patient Stated Goal: get stronger OT Goal Formulation: With patient Time For Goal Achievement: 11/24/23 Potential to Achieve Goals: Fair  Plan      Co-evaluation    PT/OT/SLP Co-Evaluation/Treatment: Yes Reason for Co-Treatment: For patient/therapist safety PT goals addressed during session: Mobility/safety with  mobility;Proper use of DME OT goals addressed during session: ADL's and self-care      AM-PAC OT 6 Clicks Daily Activity     Outcome Measure   Help from another person eating meals?: A Little Help from another person taking care of personal grooming?: A Little Help from another person toileting, which includes using toliet, bedpan, or urinal?: A Lot Help from another person bathing (including washing, rinsing, drying)?: A Lot Help from another person to put on and taking off regular upper body clothing?: A Little Help from another person to put on and taking off regular lower body clothing?: A Lot 6 Click Score: 15    End of Session Equipment Utilized During Treatment: Rolling walker (2 wheels);Gait belt  OT Visit Diagnosis: Unsteadiness on feet (R26.81);History of falling (Z91.81);Muscle weakness (generalized) (M62.81)   Activity Tolerance Patient limited by fatigue   Patient Left with call bell/phone  within reach;in bed;with bed alarm set   Nurse Communication Mobility status        Time: 1128-1202 OT Time Calculation (min): 34 min  Charges: OT General Charges $OT Visit: 1 Visit OT Treatments $Self Care/Home Management : 8-22 mins  Analie Katzman, OTR/L  11/20/23, 2:41 PM   Torez Beauregard E Woodrow Drab 11/20/2023, 2:36 PM

## 2023-11-20 NOTE — Assessment & Plan Note (Signed)
Replace magnesium

## 2023-11-21 ENCOUNTER — Other Ambulatory Visit: Payer: Self-pay | Admitting: Oncology

## 2023-11-21 DIAGNOSIS — R531 Weakness: Secondary | ICD-10-CM | POA: Diagnosis not present

## 2023-11-21 DIAGNOSIS — E876 Hypokalemia: Secondary | ICD-10-CM | POA: Diagnosis not present

## 2023-11-21 DIAGNOSIS — I4891 Unspecified atrial fibrillation: Secondary | ICD-10-CM | POA: Diagnosis not present

## 2023-11-21 DIAGNOSIS — C259 Malignant neoplasm of pancreas, unspecified: Secondary | ICD-10-CM

## 2023-11-21 DIAGNOSIS — I951 Orthostatic hypotension: Secondary | ICD-10-CM | POA: Diagnosis not present

## 2023-11-21 LAB — POTASSIUM: Potassium: 3.6 mmol/L (ref 3.5–5.1)

## 2023-11-21 LAB — HEMOGLOBIN: Hemoglobin: 9.4 g/dL — ABNORMAL LOW (ref 12.0–15.0)

## 2023-11-21 LAB — MAGNESIUM: Magnesium: 2 mg/dL (ref 1.7–2.4)

## 2023-11-21 MED ORDER — SODIUM CHLORIDE 0.9% FLUSH
10.0000 mL | INTRAVENOUS | Status: DC | PRN
  Administered 2023-11-26: 10 mL

## 2023-11-21 MED ORDER — SODIUM CHLORIDE 0.9% FLUSH
10.0000 mL | Freq: Two times a day (BID) | INTRAVENOUS | Status: DC
  Administered 2023-11-21 – 2023-11-26 (×11): 10 mL

## 2023-11-21 MED ORDER — FAMOTIDINE 20 MG PO TABS
20.0000 mg | ORAL_TABLET | Freq: Every day | ORAL | Status: DC
  Administered 2023-11-21 – 2023-12-01 (×11): 20 mg via ORAL
  Filled 2023-11-21 (×11): qty 1

## 2023-11-21 MED ORDER — CHLORHEXIDINE GLUCONATE CLOTH 2 % EX PADS
6.0000 | MEDICATED_PAD | Freq: Every day | CUTANEOUS | Status: DC
  Administered 2023-11-21 – 2023-11-26 (×6): 6 via TOPICAL

## 2023-11-21 NOTE — TOC Progression Note (Addendum)
 Transition of Care [facility]) - Progression Note    Patient Details  Name: Colleen [name] MRN: [id number] Date of Birth: Dec 02, 1945  Transition of Care [facility]) CM/SW Contact  Dalia GORMAN Fuse, RN Phone Number: 11/21/2023, 11:14 AM  Clinical Narrative:     TOC placed call to HTA at [facility] [phone], the patient's ins shara was denied over the weekend.   TOC spoke with the MD and the patient's niece advised that she initiated an appeal.  TOC spoke with Tammy at [employer] again and she advised that she reviewed the medical record and her team has not been advised of the appeal yet. There is another team that receives the appeals and reviews. Appeal is pending at this time.  TOC will continue to follow.    Barriers to Discharge: Other (must enter comment) (Awaiting family decision on bed offers)               Expected Discharge Plan and Services         Expected Discharge Date: 11/18/23                                     Social Drivers of Health (SDOH) Interventions SDOH Screenings   Food Insecurity: Food Insecurity Present (11/11/2023)  Housing: Low Risk  (11/11/2023)  Transportation Needs: No Transportation Needs (11/11/2023)  Recent Concern: Transportation Needs - Unmet Transportation Needs (10/01/2023)  Utilities: At Risk (11/11/2023)  Alcohol Screen: Low Risk  (12/02/2022)  Depression (PHQ2-9): Low Risk  (11/08/2023)  Financial Resource Strain: Low Risk  (12/02/2022)  Physical Activity: Insufficiently Active (12/02/2022)  Social Connections: Socially Isolated (11/11/2023)  Stress: No Stress Concern Present (12/02/2022)  Tobacco Use: Medium Risk (11/14/2023)  Health Literacy: Adequate Health Literacy (12/02/2022)    Readmission Risk Interventions    10/28/2023    4:03 PM 10/03/2023    3:20 PM  Readmission Risk Prevention Plan  Transportation Screening Complete Complete  Medication Review Oceanographer) Complete Complete  PCP or Specialist appointment within  3-5 days of discharge Complete Complete  HRI or Home Care Consult Complete   SW Recovery Care/Counseling Consult Complete Complete  Palliative Care Screening Not Applicable Not Applicable  Skilled Nursing Facility Not Applicable Not Applicable

## 2023-11-21 NOTE — Progress Notes (Signed)
 Progress Note   Patient: Colleen [name] FMW:[id number] DOB: 1945-06-14 DOA: 11/09/2023     11 DOS: the patient was seen and examined on 11/21/2023   Brief hospital course: Hospital course / significant events:   ESTREYA CLAY is a 78 y.o. female with medical history significant of metastasized stage IV pancreatic cancer on chemotherapy, HTN, HLD, prediabetes, COPD/asthma, gout, GERD, CKD-3, anemia, A-fib and PE not on Eliquis , lumbar spinal stenosis, who presents with weakness   HPI:  weakness worsening over 1 week. She has poor appetite and decreased oral intake with generalized weakness. Pt states that she was trying to get to the kitchen to make something to eat however she was feeling very weak and lowered herself down to the ground. No fall. Of note, was recently hospitalized from 10/2 - 10/10 due to SIRS due to worsening anemia.  Hemoglobin down to 6.8, received 1 unit of blood transfusion.  Eliquis  was discontinued as recommended by cardiologist.   10/15: admitted for Afib RVR, HR improved w/o intervention.  10/16: still weak, PT/OT recs for SNF, TOC consult placed, see TOC note today.  10/17-10/21: awaiting placement  10/22.  As per [facility] awaiting insurance authorization for rehab. 10/23.  Peer-to-peer done with physician representing insurance company. 10/24.  Patient did not feel well with standing up today.  Unsafe to go home currently.  Midodrine  increased to 10 mg 3 times daily 10/25.  Patient orthostatic.  Will give a fluid bolus.  Family to appeal rehab denial 10/26.  Still waiting insurance company decision on appeal. 10/27.  Awaiting appeal from insurance company  Consultants:  none  Procedures/Surgeries: none            Assessment and Plan: * Orthostatic hypotension Continue midodrine  10 mg 3 times a day.  Blood pressure on the lower side  Atrial fibrillation with RVR (HCC) Continue amiodarone .  Not on anticoagulation.  Heart rates  improved.  Hypokalemia Replace on a daily basis.  Today's potassium 3.6  Primary pancreatic cancer with metastasis to other site [facility]) Patient with stage IV metastatic pancreatic cancer.  Follow-up with oncology as outpatient.  I spoke with Dr. Jacobo today.  Unable to give chemo as inpatient.  Generalized weakness Physical therapy recommending rehab  Hyperlipidemia, unspecified On Lipitor  Hypophosphatemia Replaced  Myocardial injury Likely from fast heart rate.  Unable to tolerate aspirin .  On Lipitor.  Chronic kidney disease, stage 3a (HCC) Last creatinine 0.78 with a GFR greater than 60  Normocytic anemia Last hemoglobin 9.4, ferritin elevated 6446.  Obesity (BMI 30-39.9) BMI 30.8  Hypomagnesemia Replaced IV yesterday.  On oral magnesium .        Subjective: Patient frustrated about being in the hospital and not being out to rehab.  Patient interested in receiving chemo and making appointment tomorrow.  Unfortunately have not heard back from the insurance company yet.  Spoke with Dr. Jacobo and unable to receive chemo as inpatient.  Physical Exam: Vitals:   11/21/23 0422 11/21/23 0805 11/21/23 0819 11/21/23 1400  BP: 112/62 (!) 86/51 (!) 98/57   Pulse: 60 92 70   Resp: 15 15 18    Temp: 98.5 F (36.9 C) (!) 97.5 F (36.4 C)    TempSrc: Oral Oral    SpO2: 99% (!) 88%  97%  Weight:      Height:       Physical Exam HENT:     Head: Normocephalic.  Eyes:     General: Lids are normal.  Conjunctiva/sclera: Conjunctivae normal.  Cardiovascular:     Rate and Rhythm: Normal rate and regular rhythm.     Heart sounds: Normal heart sounds, S1 normal and S2 normal.  Pulmonary:     Breath sounds: No decreased breath sounds, wheezing, rhonchi or rales.  Abdominal:     Palpations: Abdomen is soft.     Tenderness: There is no abdominal tenderness.  Musculoskeletal:     Right lower leg: Swelling present.     Left lower leg: Swelling present.  Skin:     General: Skin is warm.     Findings: No rash.  Neurological:     Mental Status: She is alert and oriented to person, place, and time.     Data Reviewed: Potassium 3.6, magnesium  2.0, hemoglobin 9.4  Family Communication: Spoke with niece on the phone  Disposition: Status is: Inpatient Remains inpatient appropriate because: Awaiting for insurance company to decide on appeal.  Planned Discharge Destination: Skilled nursing facility    Time spent: 28 minutes  Author: Charlie Patterson, MD 11/21/2023 2:42 PM  For on call review [email or web address].

## 2023-11-21 NOTE — Plan of Care (Signed)
  Problem: Clinical Measurements: Goal: Ability to maintain clinical measurements within normal limits will improve Outcome: Progressing Goal: Respiratory complications will improve Outcome: Progressing Goal: Cardiovascular complication will be avoided Outcome: Progressing   Problem: Activity: Goal: Risk for activity intolerance will decrease Outcome: Progressing   Problem: Nutrition: Goal: Adequate nutrition will be maintained Outcome: Progressing   Problem: Coping: Goal: Level of anxiety will decrease Outcome: Progressing   Problem: Elimination: Goal: Will not experience complications related to bowel motility Outcome: Progressing Goal: Will not experience complications related to urinary retention Outcome: Progressing   Problem: Safety: Goal: Ability to remain free from injury will improve Outcome: Progressing   Problem: Pain Managment: Goal: General experience of comfort will improve and/or be controlled Outcome: Progressing   Problem: Skin Integrity: Goal: Risk for impaired skin integrity will decrease Outcome: Progressing

## 2023-11-22 ENCOUNTER — Inpatient Hospital Stay

## 2023-11-22 ENCOUNTER — Ambulatory Visit: Admitting: Oncology

## 2023-11-22 ENCOUNTER — Other Ambulatory Visit

## 2023-11-22 DIAGNOSIS — E876 Hypokalemia: Secondary | ICD-10-CM | POA: Diagnosis not present

## 2023-11-22 DIAGNOSIS — I4891 Unspecified atrial fibrillation: Secondary | ICD-10-CM | POA: Diagnosis not present

## 2023-11-22 DIAGNOSIS — I951 Orthostatic hypotension: Secondary | ICD-10-CM | POA: Diagnosis not present

## 2023-11-22 DIAGNOSIS — R531 Weakness: Secondary | ICD-10-CM | POA: Diagnosis not present

## 2023-11-22 LAB — COMPREHENSIVE METABOLIC PANEL WITH GFR
ALT: 11 U/L (ref 0–44)
AST: 22 U/L (ref 15–41)
Albumin: 2 g/dL — ABNORMAL LOW (ref 3.5–5.0)
Alkaline Phosphatase: 78 U/L (ref 38–126)
Anion gap: 6 (ref 5–15)
BUN: 14 mg/dL (ref 8–23)
CO2: 23 mmol/L (ref 22–32)
Calcium: 8 mg/dL — ABNORMAL LOW (ref 8.9–10.3)
Chloride: 109 mmol/L (ref 98–111)
Creatinine, Ser: 0.91 mg/dL (ref 0.44–1.00)
GFR, Estimated: >60 mL/min (ref >60)
Glucose, Bld: 102 mg/dL — ABNORMAL HIGH (ref 70–99)
Potassium: 3.6 mmol/L (ref 3.5–5.1)
Sodium: 138 mmol/L (ref 135–145)
Total Bilirubin: 0.6 mg/dL (ref 0.0–1.2)
Total Protein: 5.2 g/dL — ABNORMAL LOW (ref 6.5–8.1)

## 2023-11-22 LAB — CBC
HCT: 27 % — ABNORMAL LOW (ref 36.0–46.0)
Hemoglobin: 8.8 g/dL — ABNORMAL LOW (ref 12.0–15.0)
MCH: 30.3 pg (ref 26.0–34.0)
MCHC: 32.6 g/dL (ref 30.0–36.0)
MCV: 93.1 fL (ref 80.0–100.0)
Platelets: 323 K/uL (ref 150–400)
RBC: 2.9 MIL/uL — ABNORMAL LOW (ref 3.87–5.11)
RDW: 19.9 % — ABNORMAL HIGH (ref 11.5–15.5)
WBC: 10.4 K/uL (ref 4.0–10.5)
nRBC: 0 % (ref 0.0–0.2)

## 2023-11-22 LAB — MAGNESIUM: Magnesium: 2.1 mg/dL (ref 1.7–2.4)

## 2023-11-22 MED ORDER — MIDODRINE HCL 5 MG PO TABS
15.0000 mg | ORAL_TABLET | Freq: Three times a day (TID) | ORAL | Status: DC
  Administered 2023-11-22 – 2023-12-01 (×28): 15 mg via ORAL
  Filled 2023-11-22 (×28): qty 3

## 2023-11-22 NOTE — Progress Notes (Addendum)
 Progress Note   Patient: Colleen [name] FMW:[id number] DOB: [birth year] DOA: 11/09/2023     12 DOS: the patient was seen and examined on 11/22/2023   Brief hospital course: Hospital course / significant events:   Colleen [name] is a 78 y.o. female with medical history significant of metastasized stage IV pancreatic cancer on chemotherapy, HTN, HLD, prediabetes, COPD/asthma, gout, GERD, CKD-3, anemia, A-fib and PE not on Eliquis , lumbar spinal stenosis, who presents with weakness   HPI:  weakness worsening over 1 week. She has poor appetite and decreased oral intake with generalized weakness. Pt states that she was trying to get to the kitchen to make something to eat however she was feeling very weak and lowered herself down to the ground. No fall. Of note, was recently hospitalized from 10/2 - 10/10 due to SIRS due to worsening anemia.  Hemoglobin down to 6.8, received 1 unit of blood transfusion.  Eliquis  was discontinued as recommended by cardiologist.   10/15: admitted for Afib RVR, HR improved w/o intervention.  10/16: still weak, PT/OT recs for SNF, TOC consult placed, see TOC note today.  10/17-10/21: awaiting placement  10/22.  As per [facility] awaiting insurance authorization for rehab. 10/23.  Peer-to-peer done with physician representing insurance company. 10/24.  Patient did not feel well with standing up today.  Unsafe to go home currently.  Midodrine  increased to 10 mg 3 times daily 10/25.  Patient orthostatic.  Will give a fluid bolus.  Family to appeal rehab denial 10/26.  Still waiting insurance company decision on appeal. 10/27.  Awaiting appeal from insurance company 10/28.  Increase midodrine  15 mg 3 times daily.  Still waiting on appeal decision from insurance company  Consultants:  none  Procedures/Surgeries: none            Assessment and Plan: * Orthostatic hypotension Continue midodrine  10 mg 3 times a day.  Blood pressure on the lower side  Atrial  fibrillation with RVR (HCC) Continue amiodarone .  Not on anticoagulation.  Heart rates improved.  Hypokalemia Replace on a daily basis.  Today's potassium 3.6  Primary pancreatic cancer with metastasis to other site [facility]) Patient with stage IV metastatic pancreatic cancer.  Follow-up with oncology as outpatient.  I spoke with Dr. Jacobo today.  Unable to give chemo as inpatient.  Generalized weakness Physical therapy recommending rehab  Hyperlipidemia, unspecified On Lipitor  Hypophosphatemia Replaced  Myocardial injury Likely from fast heart rate.  Unable to tolerate aspirin .  On Lipitor.  Chronic kidney disease, stage 3a (HCC) Last creatinine 0.91 with a GFR greater than 60  Normocytic anemia Last hemoglobin 8.8, ferritin elevated 6446.  Obesity (BMI 30-39.9) BMI 30.8  Hypomagnesemia Replaced IV previously.  On oral magnesium .        Subjective: Patient states she feels lightheaded and dizzy with standing.  Will increase midodrine  to 15 mg 3 times daily.  Advised to eat and drink  Physical Exam: Vitals:   11/21/23 2004 11/22/23 0435 11/22/23 0810 11/22/23 0815  BP: (!) 117/59 112/62 90/69 91/68   Pulse: 60 (!) 54 63 62  Resp: 18 16 16 15   Temp: 98.1 F (36.7 C) 98.2 F (36.8 C) 97.7 F (36.5 C) 97.7 F (36.5 C)  TempSrc: Oral     SpO2: 98% 100% 100% 100%  Weight:      Height:       Physical Exam HENT:     Head: Normocephalic.  Eyes:     General: Lids are normal.  Conjunctiva/sclera: Conjunctivae normal.  Cardiovascular:     Rate and Rhythm: Normal rate and regular rhythm.     Heart sounds: Normal heart sounds, S1 normal and S2 normal.  Pulmonary:     Breath sounds: No decreased breath sounds, wheezing, rhonchi or rales.  Abdominal:     Palpations: Abdomen is soft.     Tenderness: There is no abdominal tenderness.  Musculoskeletal:     Right lower leg: Swelling present.     Left lower leg: Swelling present.  Skin:    General: Skin is  warm.     Findings: No rash.  Neurological:     Mental Status: She is alert and oriented to person, place, and time.     Data Reviewed: White blood cell count 10.4, hemoglobin 8.8, platelet count 323, electrolytes normal range, glucose 102, creatinine 0.91, albumin 2.0  Family Communication: Spoke with niece on phone  Disposition: Status is: Inpatient Remains inpatient appropriate because: Waiting for insurance company need for appeal for the rehab.  Increasing midodrine  to 15 mg 3 times daily  Planned Discharge Destination: Rehab    Time spent: 28 minutes  Author: Charlie Patterson, MD 11/22/2023 2:30 PM  For on call review [email or web address].

## 2023-11-22 NOTE — Progress Notes (Signed)
 OT Cancellation Note  Patient Details Name: GENINE BECKETT MRN: [id number] DOB: 1945/07/02   Cancelled Treatment:    Reason Eval/Treat Not Completed: Patient declined, no reason specified;Other (comment) (pt refusing to participate, reports 'y'all dont leave me alone, you wake me up all night, last time you got me into the chair, you left me there 7 hours and no one came and got me. patient continuously refused after 5 minutes of coaxing to participate) OT discussed with RN who reports patient has been refusing to get out of bed all morning with nursing as well. OT will reattempt at a later date.  [name] Clause 11/22/2023, 10:57 AM

## 2023-11-22 NOTE — Progress Notes (Signed)
 Multi attempts get pt OOB today, pt refusing mobility

## 2023-11-22 NOTE — Plan of Care (Signed)

## 2023-11-22 NOTE — Progress Notes (Signed)
 PT Cancellation Note  Patient Details Name: Colleen [name] MRN: [id number] DOB: [birth year]   Cancelled Treatment:    Reason Eval/Treat Not Completed: Nursing requested PT to hold this date due to pt fatigue resulting in multiple staff interactions with pt this date where pt has refused participation.  Will attempt to see pt at a future date/time as medically appropriate.     CHARM [name] PT, DPT 11/22/23, 2:03 PM

## 2023-11-23 DIAGNOSIS — I951 Orthostatic hypotension: Secondary | ICD-10-CM | POA: Diagnosis not present

## 2023-11-23 NOTE — Consult Note (Signed)
 WOC Nurse Consult Note: Reason for Consult: Moisture associated skin damage to right buttock.  Patient has been refusing to get OOB and not motivated to participate in ADLS since 11/21/23.  Refusing to get OOB.  Is continent of bowel and bladder but requires assistance with tranfers and hygiene.  She complains of weakness and debility.  She requests to wear adult briefs at all times.  This is trapping moisture against skin.   Wound type:moisture associated skin damage.  Pressure Injury POA: NA Measurement: 1 cm x 1.5 cm x 0.1 cm  Wound azi:epwx Drainage (amount, consistency, odor) minimal serosanguinous  no odor  Periwound: Perineal skin in brief per patient request and refusing to get OOB for therapy  Dressing procedure/placement/frequency: Silicone foam dressing to right buttock wound  change every three days and PRN soilage.  Encourage to not wear brief while in bed.  Will not follow at this time.  Please re-consult if needed.  Darice Cooley MSN, RN, FNP-BC CWON Wound, Ostomy, Continence Nurse Outpatient [facility] [phone] Work cell phone:  [phone]

## 2023-11-23 NOTE — Progress Notes (Signed)
 PROGRESS NOTE    [name]  FMW:[id number] DOB: 1945-04-29 DOA: 11/09/2023 PCP: Leavy Mole, PA-C    Brief Narrative:  Colleen [name] is a 78 y.o. female with medical history significant of metastasized stage IV pancreatic cancer on chemotherapy, HTN, HLD, prediabetes, COPD/asthma, gout, GERD, CKD-3, anemia, A-fib and PE not on Eliquis , lumbar spinal stenosis, who presents with weakness    HPI:  weakness worsening over 1 week. She has poor appetite and decreased oral intake with generalized weakness. Pt states that she was trying to get to the kitchen to make something to eat however she was feeling very weak and lowered herself down to the ground. No fall. Of note, was recently hospitalized from 10/2 - 10/10 due to SIRS due to worsening anemia.  Hemoglobin down to 6.8, received 1 unit of blood transfusion.  Eliquis  was discontinued as recommended by cardiologist.    10/15: admitted for Afib RVR, HR improved w/o intervention.  10/16: still weak, PT/OT recs for SNF, TOC consult placed, see TOC note today.  10/17-10/21: awaiting placement  10/22.  As per [facility] awaiting insurance authorization for rehab. 10/23.  Peer-to-peer done with physician representing insurance company. 10/24.  Patient did not feel well with standing up today.  Unsafe to go home currently.  Midodrine  increased to 10 mg 3 times daily 10/25.  Patient orthostatic.  Will give a fluid bolus.  Family to appeal rehab denial 10/26.  Still waiting insurance company decision on appeal. 10/27.  Awaiting appeal from insurance company 10/28.  Increase midodrine  15 mg 3 times daily.  Still waiting on appeal decision from insurance company   Assessment & Plan:   Principal Problem:   Orthostatic hypotension Active Problems:   Atrial fibrillation with RVR (HCC)   Hypokalemia   Primary pancreatic cancer with metastasis to other site [facility])   Generalized weakness   Hyperlipidemia, unspecified   COPD (chronic obstructive  pulmonary disease) (HCC)   Myocardial injury   Hypophosphatemia   Gout   Chronic kidney disease, stage 3a (HCC)   Normocytic anemia   Obesity (BMI 30-39.9)   Hypomagnesemia  * Orthostatic hypotension Continue midodrine  15mg  3 times daily.  Patient needs to be motivated to work with physical therapy.  Skilled nursing facility appeal still in progress.  Encourage mobility.   Atrial fibrillation with RVR (HCC) Continue amiodarone .  Not on anticoagulation.  Heart rates improved.   Hypokalemia Replace on a daily basis.  Today's potassium 3.6   Primary pancreatic cancer with metastasis to other site [facility]) Patient with stage IV metastatic pancreatic cancer.  Follow-up with oncology as outpatient.  Oncology aware of admission.  Unable to give chemo as inpatient   Generalized weakness Physical therapy recommending rehab   Hyperlipidemia, unspecified On Lipitor   Hypophosphatemia Replaced   Myocardial injury Likely from fast heart rate.  Unable to tolerate aspirin .  On Lipitor.   Chronic kidney disease, stage 3a (HCC) Last creatinine 0.91 with a GFR greater than 60   Normocytic anemia Last hemoglobin 8.8, ferritin elevated 6446.   Obesity (BMI 30-39.9) BMI 30.8   Hypomagnesemia Replaced IV previously.  On oral magnesium .    DVT prophylaxis: SQ Lovenox  Code Status: Full Family Communication: Niece [name] [place] [phone] on 10/29 Disposition Plan: Status is: Inpatient Remains inpatient appropriate because: Unsafe discharge plan.  Appeal for skilled nursing facility denial is in progress.   Level of care: Med-Surg  Consultants:  None  Procedures:  None  Antimicrobials: None   Subjective: Seen and  examined.  Lethargic.  Opens eyes and answers questions briefly then goes back to sleep.  Objective: Vitals:   11/22/23 1918 11/23/23 0508 11/23/23 0720 11/23/23 0721  BP: 122/68 111/72 115/72   Pulse: (!) 104 60 (!) 54 (!) 59  Resp: 16 16 18    Temp: 98.8 F  (37.1 C) 98.8 F (37.1 C) 98 F (36.7 C)   TempSrc: Oral Oral Oral   SpO2: 100% 100% 100%   Weight:      Height:        Intake/Output Summary (Last 24 hours) at 11/23/2023 1329 Last data filed at 11/23/2023 1001 Gross per 24 hour  Intake 0 ml  Output --  Net 0 ml   Filed Weights   11/14/23 0835  Weight: 81.4 kg    Examination:  General exam: Appears weak and fatigued Respiratory system: Respiratory effort.  Lungs clear.  Room air Cardiovascular system: S1-S2, RRR, no murmurs, no pedal edema Gastrointestinal system: Thin, soft, NT/ND, normal bowel sounds Central nervous system: Alert and oriented. No focal neurological deficits. Extremities: Symmetric decreased power bilaterally Skin: No rashes, lesions or ulcers Psychiatry: Judgement and insight appear impaired. Mood & affect flattened.     Data Reviewed: I have personally reviewed following labs and imaging studies  CBC: Recent Labs  Lab 11/18/23 0528 11/19/23 0612 11/20/23 0738 11/21/23 0547 11/22/23 0422  WBC 12.3*  --   --   --  10.4  HGB 8.2* 8.4* 8.6* 9.4* 8.8*  HCT 24.6*  --   --   --  27.0*  MCV 91.4  --   --   --  93.1  PLT 174  --   --   --  323   Basic Metabolic Panel: Recent Labs  Lab 11/18/23 0528 11/19/23 0612 11/20/23 0738 11/21/23 0547 11/22/23 0422  NA 138 140  --   --  138  K 3.1* 3.4* 3.3* 3.6 3.6  CL 107 108  --   --  109  CO2 24 23  --   --  23  GLUCOSE 95 87  --   --  102*  BUN 12 13  --   --  14  CREATININE 0.85 0.83 0.78  --  0.91  CALCIUM  7.9* 8.1*  --   --  8.0*  MG  --   --  1.7 2.0 2.1   GFR: Estimated Creatinine Clearance: 53.5 mL/min (by C-G formula based on SCr of 0.91 mg/dL). Liver Function Tests: Recent Labs  Lab 11/22/23 0422  AST 22  ALT 11  ALKPHOS 78  BILITOT 0.6  PROT 5.2*  ALBUMIN 2.0*   No results for input(s): LIPASE, AMYLASE in the last 168 hours. No results for input(s): AMMONIA in the last 168 hours. Coagulation Profile: No results  for input(s): INR, PROTIME in the last 168 hours. Cardiac Enzymes: No results for input(s): CKTOTAL, CKMB, CKMBINDEX, TROPONINI in the last 168 hours. BNP (last 3 results) No results for input(s): PROBNP in the last 8760 hours. HbA1C: No results for input(s): HGBA1C in the last 72 hours. CBG: No results for input(s): GLUCAP in the last 168 hours. Lipid Profile: No results for input(s): CHOL, HDL, LDLCALC, TRIG, CHOLHDL, LDLDIRECT in the last 72 hours. Thyroid  Function Tests: No results for input(s): TSH, T4TOTAL, FREET4, T3FREE, THYROIDAB in the last 72 hours. Anemia Panel: No results for input(s): VITAMINB12, FOLATE, FERRITIN, TIBC, IRON, RETICCTPCT in the last 72 hours. Sepsis Labs: No results for input(s): PROCALCITON, LATICACIDVEN in the last 168 hours.  No results found for this or any previous visit (from the past 240 hours).       Radiology Studies: No results found.      Scheduled Meds:  allopurinol   200 mg Oral Daily   amiodarone   200 mg Oral Daily   atorvastatin  10 mg Oral Daily   budesonide -glycopyrrolate -formoterol   2 puff Inhalation BID   Chlorhexidine  Gluconate Cloth  6 each Topical Daily   cholecalciferol  1,000 Units Oral Daily   enoxaparin  (LOVENOX ) injection  40 mg Subcutaneous QHS   famotidine   20 mg Oral Daily   magnesium  chloride  1 tablet Oral Daily   megestrol   20 mg Oral BID   midodrine   15 mg Oral TID WC   multivitamin with minerals  1 tablet Oral Daily   potassium chloride   40 mEq Oral Daily   sodium chloride  flush  10-40 mL Intracatheter Q12H   Continuous Infusions:   LOS: 13 days     Calvin KATHEE Robson, MD [facility]   If 7PM-7AM, please contact night-coverage  11/23/2023, 1:29 PM

## 2023-11-23 NOTE — TOC Progression Note (Signed)
 Transition of Care [facility]) - Progression Note    Patient Details  Name: Colleen [name] MRN: [id number] Date of Birth: March 02, 1945  [facility]) CM/SW Contact  Marinda Cooks, RN Phone Number: 11/23/2023, 1:20 PM  Clinical Narrative:    This CM called and spoke with Ellouise at [employer] ins appeal remains pending . TOC will cont to follow dc planning / care coordination and update as applicable.       Expected Discharge Plan and Services    SNF  Expected Discharge Date: 11/18/23                                     Social Drivers of Health (SDOH) Interventions SDOH Screenings   Food Insecurity: Food Insecurity Present (11/11/2023)  Housing: Low Risk  (11/11/2023)  Transportation Needs: No Transportation Needs (11/11/2023)  Recent Concern: Transportation Needs - Unmet Transportation Needs (10/01/2023)  Utilities: At Risk (11/11/2023)  Alcohol Screen: Low Risk  (12/02/2022)  Depression (PHQ2-9): Low Risk  (11/08/2023)  Financial Resource Strain: Low Risk  (12/02/2022)  Physical Activity: Insufficiently Active (12/02/2022)  Social Connections: Socially Isolated (11/11/2023)  Stress: No Stress Concern Present (12/02/2022)  Tobacco Use: Medium Risk (11/14/2023)  Health Literacy: Adequate Health Literacy (12/02/2022)    Readmission Risk Interventions    10/28/2023    4:03 PM 10/03/2023    3:20 PM  Readmission Risk Prevention Plan  Transportation Screening Complete Complete  Medication Review Oceanographer) Complete Complete  PCP or Specialist appointment within 3-5 days of discharge Complete Complete  HRI or Home Care Consult Complete   SW Recovery Care/Counseling Consult Complete Complete  Palliative Care Screening Not Applicable Not Applicable  Skilled Nursing Facility Not Applicable Not Applicable

## 2023-11-23 NOTE — Progress Notes (Signed)
 Physical Therapy Treatment Patient Details Name: Colleen [name] MRN: [id number] DOB: 15-Mar-1945 Today's Date: 11/23/2023   History of Present Illness Patient is a 78 year old female with weakness, A-fib with RVR. Recent hospitalization with SIRS due to worsening anemia. PMH: metastasized stage IV pancreatic cancer on chemotherapy, HTN, HLD, prediabetes, COPD/asthma, gout, GERD, CKD-3, anemia, A-fib and PE not on Eliquis , lumbar spinal stenosis    PT Comments  Pt initially resistant to doing a lot with PT saying she does not feel particularly well.  Visitor present and helpful in encouraging her to do at least some activity.  Pt with slow but reasonably independent effort getting up to sitting EOB, set up for transfer to [facility] however pt with persistent lightheadedness, BP taken and was low, even down to 62/35 and further upright activity deferred. Pt's BP returned quickly to WNL on laying, continued with a few exercises in bed.  Nursing in to assist with clean up post session.  Pt will benefit from ongoing PT to address functional limitations, continue with POC.    If plan is discharge home, recommend the following: A little help with walking and/or transfers;A little help with bathing/dressing/bathroom;Assistance with cooking/housework;Help with stairs or ramp for entrance   Can travel by private vehicle     No  Equipment Recommendations  Other (comment) (TBD at rehab)    Recommendations for Other Services       Precautions / Restrictions Precautions Precautions: Fall Recall of Precautions/Restrictions: Impaired Restrictions Weight Bearing Restrictions Per Provider Order: No     Mobility  Bed Mobility Overal bed mobility: Needs Assistance Bed Mobility: Supine to Sit, Sit to Supine     Supine to sit: Min assist, HOB elevated, Used rails, Contact guard Sit to supine: Mod assist   General bed mobility comments: Pt was slow to initiate movement and needed consistent  encouragement/cuing, but did manage to get herself up to sitting EOB with very little hands on assist.  Heavier assist with getting LEs back into bed after limited EOB activity    Transfers                   General transfer comment: deferred 2/2 persistent c/o lightheadedness.  BP after ~6 minutes 77/54, additional drops with more prolonged sitting - deferred standing attempts    Ambulation/Gait                   Stairs             Wheelchair Mobility     Tilt Bed    Modified Rankin (Stroke Patients Only)       Balance                                            Communication Communication Communication: No apparent difficulties  Cognition Arousal: Alert Behavior During Therapy: Flat affect                             Following commands: Intact Following commands impaired: Follows one step commands with increased time    Cueing Cueing Techniques: Verbal cues  Exercises General Exercises - Lower Extremity Long Arc Quad: Strengthening, 5 reps, Seated Heel Slides: Strengthening, 10 reps (with resisted leg ex) Hip ABduction/ADduction: Strengthening, 10 reps, AROM    General Comments General comments (skin integrity, edema, etc.):  BP seated: 77/54 subjectively reports lightheadedness, after a few LE exercises 62/35 - quickly back to supine with BP 124/65      Pertinent Vitals/Pain Pain Assessment Pain Assessment: No/denies pain    Home Living                          Prior Function            PT Goals (current goals can now be found in the care plan section) Progress towards PT goals: Progressing toward goals (comorbidities and BP limiting progression)    Frequency    Min 2X/week      PT Plan      Co-evaluation              AM-PAC PT 6 Clicks Mobility   Outcome Measure  Help needed turning from your back to your side while in a flat bed without using bedrails?: A Little Help  needed moving from lying on your back to sitting on the side of a flat bed without using bedrails?: A Little Help needed moving to and from a bed to a chair (including a wheelchair)?: A Little Help needed standing up from a chair using your arms (e.g., wheelchair or bedside chair)?: A Lot Help needed to walk in hospital room?: Total Help needed climbing 3-5 steps with a railing? : Total 6 Click Score: 13    End of Session Equipment Utilized During Treatment: Gait belt Activity Tolerance: Treatment limited secondary to medical complications (Comment);Patient limited by fatigue Patient left: in bed;with bed alarm set;with call bell/phone within reach Nurse Communication: Mobility status;Other (comment) (BP drop with mobility) PT Visit Diagnosis: Muscle weakness (generalized) (M62.81);Unsteadiness on feet (R26.81)     Time: 8484-8454 PT Time Calculation (min) (ACUTE ONLY): 30 min  Charges:    $Therapeutic Exercise: 8-22 mins $Therapeutic Activity: 8-22 mins PT General Charges $$ ACUTE PT VISIT: 1 Visit                     [name], DPT 11/23/2023, 4:00 PM

## 2023-11-23 NOTE — Progress Notes (Signed)
 PT Cancellation Note  Patient Details Name: Colleen [name] MRN: [id number] DOB: 07/26/1945   Cancelled Treatment:    Reason Eval/Treat Not Completed: Other (comment) Attempted to see pt this AM, she was working with OT and needing nursing assist with wound care.  Will maintain on caseload and continue to see when appropriate.  [name], DPT 11/23/2023, 1:08 PM

## 2023-11-23 NOTE — Plan of Care (Signed)

## 2023-11-23 NOTE — Progress Notes (Signed)
 Occupational Therapy Treatment Patient Details Name: Colleen [name] MRN: [id number] DOB: 04-Feb-1945 Today's Date: 11/23/2023   History of present illness Patient is a 78 year old female with weakness, A-fib with RVR. Recent hospitalization with SIRS due to worsening anemia. PMH: metastasized stage IV pancreatic cancer on chemotherapy, HTN, HLD, prediabetes, COPD/asthma, gout, GERD, CKD-3, anemia, A-fib and PE not on Eliquis , lumbar spinal stenosis   OT comments  Patient seen for OT treatment on this date. Upon arrival to room patient initially refused to get out of bed but when OT reportedly needed to change bedding, patient reluctant but agreeable.  Patient transitioned to EOB with min A; put forth no effort to don socks or change depends, requested full A and did not participate with coaxing. Patient needed to have BM, performed SPT from EOB to [facility], patient had medium size white bowel movement, RN notified. Patient returned to bed, required mod A to lift BLE, able to participate with moving up in bed. Patient ended treatment in bed with bed/chair alarm on and all needs within reach. Patient making limited progress toward goals due to repeated refusals to work with therapy, will continue to follow POC as patient tolerates. Discharge recommendation remains appropriate.        If plan is discharge home, recommend the following:  A lot of help with walking and/or transfers;A lot of help with bathing/dressing/bathroom;Assistance with cooking/housework;Assist for transportation;Help with stairs or ramp for entrance   Equipment Recommendations  Other (comment) (defer to next venue of care)    Recommendations for Other Services      Precautions / Restrictions Precautions Precautions: Fall Recall of Precautions/Restrictions: Impaired Restrictions Weight Bearing Restrictions Per Provider Order: No       Mobility Bed Mobility Overal bed mobility: Needs Assistance Bed Mobility: Supine to Sit,  Sit to Supine     Supine to sit: Min assist, HOB elevated, Used rails Sit to supine: Mod assist   General bed mobility comments: Mod A for BLE management to return to supine at end of session d/t lightheadedness    Transfers Overall transfer level: Needs assistance Equipment used: Rolling walker (2 wheels) Transfers: Sit to/from Stand Sit to Stand: Min assist, +2 physical assistance, From elevated surface           General transfer comment: A to stand from EOB for lifting A, SPT from EOB to [facility] with cues for sequencing     Balance                                           ADL either performed or assessed with clinical judgement   ADL                       Lower Body Dressing: Sit to/from stand;Maximal assistance   Toilet Transfer: BSC/3in1;Rolling walker (2 wheels);Moderate assistance   Toileting- Clothing Manipulation and Hygiene: Maximal assistance;Sit to/from stand              Extremity/Trunk Assessment Upper Extremity Assessment Upper Extremity Assessment: Generalized weakness   Lower Extremity Assessment Lower Extremity Assessment: Defer to PT evaluation        Vision       Perception     Praxis     Communication Communication Communication: No apparent difficulties   Cognition Arousal: Alert Behavior During Therapy: Flat affect  Following commands: Intact Following commands impaired: Follows one step commands with increased time      Cueing   Cueing Techniques: Verbal cues  Exercises      Shoulder Instructions       General Comments      Pertinent Vitals/ Pain       Pain Assessment Pain Assessment: No/denies pain  Home Living                                          Prior Functioning/Environment              Frequency  Min 2X/week        Progress Toward Goals  OT Goals(current goals can now be found in the care plan  section)  Progress towards OT goals: Progressing toward goals  Acute Rehab OT Goals Patient Stated Goal: to be left alone OT Goal Formulation: With patient Time For Goal Achievement: 11/24/23 Potential to Achieve Goals: Fair ADL Goals Pt Will Perform Upper Body Dressing: sitting;with modified independence Pt Will Perform Lower Body Dressing: sit to/from stand;with supervision Pt Will Transfer to Toilet: ambulating;with supervision Pt Will Perform Toileting - Clothing Manipulation and hygiene: with modified independence;sitting/lateral leans Additional ADL Goal #1: Pt will verbalize plan to implement at least 1 learned falls prevention strategy.  Plan      Co-evaluation                 AM-PAC OT 6 Clicks Daily Activity     Outcome Measure   Help from another person eating meals?: A Little Help from another person taking care of personal grooming?: A Little Help from another person toileting, which includes using toliet, bedpan, or urinal?: A Lot Help from another person bathing (including washing, rinsing, drying)?: A Lot Help from another person to put on and taking off regular upper body clothing?: A Little Help from another person to put on and taking off regular lower body clothing?: A Lot 6 Click Score: 15    End of Session Equipment Utilized During Treatment: Rolling walker (2 wheels);Gait belt  OT Visit Diagnosis: Unsteadiness on feet (R26.81);History of falling (Z91.81);Muscle weakness (generalized) (M62.81)   Activity Tolerance Patient limited by fatigue   Patient Left in bed;with call bell/phone within reach;with bed alarm set   Nurse Communication Other (comment) (bowel movement (white))        Time: 1020-1047 OT Time Calculation (min): 27 min  Charges: OT General Charges $OT Visit: 1 Visit OT Treatments $Self Care/Home Management : 23-37 mins  Rogers Clause, OT/L MSOT, 11/23/2023

## 2023-11-24 DIAGNOSIS — Z515 Encounter for palliative care: Secondary | ICD-10-CM

## 2023-11-24 DIAGNOSIS — I951 Orthostatic hypotension: Secondary | ICD-10-CM | POA: Diagnosis not present

## 2023-11-24 NOTE — Consult Note (Signed)
 Palliative Medicine [facility] at [facility] [phone] [phone]   Name: Colleen [name] Date: 11/24/2023 MRN: [id number]  DOB: [birth year]  Patient Care Team: Leavy Mole, PA-C as PCP - General (Family Medicine) Darliss Rogue, MD as PCP - Cardiology (Cardiology) Dave Agent, MD as Consulting Physician (Rheumatology) Ceola Tereasa PARAS, MD as Referring Physician (Oncology) Therisa Bi, MD as Consulting Physician (Gastroenterology) Maurie Rayfield BIRCH, RN as Oncology Nurse Navigator Jacobo, Evalene PARAS, MD as Consulting Physician (Oncology)    REASON FOR CONSULTATION: Colleen [name] is a 78 y.o. female with multiple medical problems including stage IV pancreatic cancer on treatment with dose reduced gemcitabine  plus Abraxane .  Patient was hospitalized 10/01/2023 to 10/07/2023 with severe mucositis from chemotherapy.  She was hospitalized again 10/27/2023 to 11/04/2023 with hypotension and rapid A-fib found to have SIRS and worsening anemia.  Patient now readmitted 11/09/2023 with progressive weakness.  Palliative care consulted to address goals.  SOCIAL HISTORY:     reports that she quit smoking about 15 years ago. Her smoking use included cigarettes. She started smoking about 35 years ago. She has a 30 pack-year smoking history. She has never used smokeless tobacco. She reports that she does not currently use alcohol. She reports that she does not use drugs.  Patient is widowed x 2.  She has no children.  She worked as a neurosurgeon.  ADVANCE DIRECTIVES:  Reportedly, her niece is her healthcare power of attorney  CODE STATUS: DNR  PAST MEDICAL HISTORY: Past Medical History:  Diagnosis Date   Asthma    Cataract    COPD (chronic obstructive pulmonary disease) (HCC)    GERD (gastroesophageal reflux disease)    Gout    History of pulmonary embolus (PE)    Hyperlipidemia    Hypertension    Osteoarthrosis    Prediabetes      PAST SURGICAL HISTORY:  Past Surgical History:  Procedure Laterality Date   BACK SURGERY     BREAST BIOPSY Left 04/08/2014   intraductal papilloma 5 mm   COLONOSCOPY WITH PROPOFOL  N/A 04/23/2020   Procedure: COLONOSCOPY WITH PROPOFOL ;  Surgeon: Janalyn Keene NOVAK, MD;  Location: [facility] ENDOSCOPY;  Service: Endoscopy;  Laterality: N/A;   ERCP N/A 08/05/2023   Procedure: ERCP, WITH INTERVENTION IF INDICATED;  Surgeon: Jinny Carmine, MD;  Location: [facility] ENDOSCOPY;  Service: Endoscopy;  Laterality: N/A;   ESOPHAGOGASTRODUODENOSCOPY (EGD) WITH PROPOFOL  N/A 04/23/2020   Procedure: ESOPHAGOGASTRODUODENOSCOPY (EGD) WITH PROPOFOL ;  Surgeon: Janalyn Keene NOVAK, MD;  Location: [facility] ENDOSCOPY;  Service: Endoscopy;  Laterality: N/A;   GIVENS CAPSULE STUDY N/A 01/28/2022   Procedure: GIVENS CAPSULE STUDY;  Surgeon: Therisa Bi, MD;  Location: [facility] ENDOSCOPY;  Service: Gastroenterology;  Laterality: N/A;   IR IMAGING GUIDED PORT INSERTION  09/12/2023   IR INT EXT BILIARY DRAIN WITH CHOLANGIOGRAM  08/08/2023   IR RADIOLOGIST EVAL & MGMT  09/22/2023   IR REMOVAL BILIARY DRAIN  09/22/2023   LUMBAR LAMINECTOMY     TONSILLECTOMY      HEMATOLOGY/ONCOLOGY HISTORY:  Oncology History  Primary pancreatic cancer with metastasis to other site [facility])  09/07/2023 Initial Diagnosis   Adenocarcinoma of pancreas (HCC)   09/07/2023 Cancer Staging   Staging form: Exocrine Pancreas, AJCC 8th Edition - Clinical stage from 09/07/2023: Stage IV (cT2, cN0, cM1) - Signed by Jacobo Evalene PARAS, MD on 09/07/2023 Stage prefix: Initial diagnosis Total positive nodes: 0   09/20/2023 -  Chemotherapy   Patient is on Treatment Plan :  PANCREATIC Abraxane  D1,8,15 + Gemcitabine  D1,8,15 q28d       ALLERGIES:  is allergic to fentanyl , nsaids, and oxycodone .  MEDICATIONS:  Current Facility-Administered Medications  Medication Dose Route Frequency Provider Last Rate Last Admin   acetaminophen  (TYLENOL ) tablet 650 mg  650 mg Oral  Q6H PRN Niu, Xilin, MD       albuterol  (PROVENTIL ) (2.5 MG/3ML) 0.083% nebulizer solution 2.5 mg  2.5 mg Inhalation Q4H PRN Niu, Xilin, MD       allopurinol  (ZYLOPRIM ) tablet 200 mg  200 mg Oral Daily Niu, Xilin, MD   200 mg at 11/24/23 9192   amiodarone  (PACERONE ) tablet 200 mg  200 mg Oral Daily Niu, Xilin, MD   200 mg at 11/24/23 0805   atorvastatin (LIPITOR) tablet 10 mg  10 mg Oral Daily Niu, Xilin, MD   10 mg at 11/24/23 9193   budesonide -glycopyrrolate -formoterol  (BREZTRI ) 160-9-4.8 MCG/ACT inhaler 2 puff  2 puff Inhalation BID Niu, Xilin, MD   2 puff at 11/24/23 0813   Chlorhexidine  Gluconate Cloth 2 % PADS 6 each  6 each Topical Daily Josette Ade, MD   6 each at 11/24/23 1131   cholecalciferol (VITAMIN D3) 25 MCG (1000 UNIT) tablet 1,000 Units  1,000 Units Oral Daily Niu, Xilin, MD   1,000 Units at 11/24/23 9192   dextromethorphan-guaiFENesin  (MUCINEX  DM) 30-600 MG per 12 hr tablet 1 tablet  1 tablet Oral BID PRN Niu, Xilin, MD       docusate sodium (COLACE) capsule 100 mg  100 mg Oral BID PRN Alexander, Natalie, DO   100 mg at 11/17/23 1049   enoxaparin  (LOVENOX ) injection 40 mg  40 mg Subcutaneous QHS Alexander, Natalie, DO   40 mg at 11/23/23 2103   famotidine  (PEPCID ) tablet 20 mg  20 mg Oral Daily Josette Ade, MD   20 mg at 11/24/23 9192   magnesium  chloride (SLOW-MAG) 64 MG SR tablet 64 mg  1 tablet Oral Daily Alexander, Natalie, DO   64 mg at 11/24/23 9193   megestrol  (MEGACE ) tablet 20 mg  20 mg Oral BID Niu, Xilin, MD   20 mg at 11/24/23 9193   metoprolol  tartrate (LOPRESSOR ) injection 2.5 mg  2.5 mg Intravenous Q2H PRN Niu, Xilin, MD       midodrine  (PROAMATINE ) tablet 15 mg  15 mg Oral TID WC Josette Ade, MD   15 mg at 11/24/23 1123   multivitamin with minerals tablet 1 tablet  1 tablet Oral Daily Niu, Xilin, MD   1 tablet at 11/24/23 9193   ondansetron  (ZOFRAN ) injection 4 mg  4 mg Intravenous Q8H PRN Niu, Xilin, MD       Oral care mouth rinse  15 mL Mouth Rinse  PRN Alexander, Natalie, DO       polyethylene glycol (MIRALAX  / GLYCOLAX ) packet 17 g  17 g Oral BID PRN Alexander, Natalie, DO       potassium chloride  SA (KLOR-CON  M) CR tablet 40 mEq  40 mEq Oral Daily Josette Ade, MD   40 mEq at 11/24/23 9192   simethicone  (MYLICON) chewable tablet 80 mg  80 mg Oral QID PRN Alexander, Natalie, DO       sodium chloride  flush (NS) 0.9 % injection 10-40 mL  10-40 mL Intracatheter Q12H Wieting, Richard, MD   10 mL at 11/24/23 1128   sodium chloride  flush (NS) 0.9 % injection 10-40 mL  10-40 mL Intracatheter PRN Josette Ade, MD        VITAL SIGNS: BP ROLLEN)  99/59 (BP Location: Left Arm)   Pulse 73   Temp 97.6 F (36.4 C) (Oral)   Resp 16   Ht 5' 4 (1.626 m)   Wt 179 lb 7.3 oz (81.4 kg)   SpO2 100%   BMI 30.80 kg/m  Filed Weights   11/14/23 0835  Weight: 179 lb 7.3 oz (81.4 kg)    Estimated body mass index is 30.8 kg/m as calculated from the following:   Height as of this encounter: 5' 4 (1.626 m).   Weight as of this encounter: 179 lb 7.3 oz (81.4 kg).  LABS: CBC:    Component Value Date/Time   WBC 10.4 11/22/2023 0422   HGB 8.8 (L) 11/22/2023 0422   HGB 9.0 (L) 11/08/2023 0811   HCT 27.0 (L) 11/22/2023 0422   PLT 323 11/22/2023 0422   PLT 534 (H) 11/08/2023 0811   MCV 93.1 11/22/2023 0422   NEUTROABS 3.0 11/08/2023 0811   LYMPHSABS 1.3 11/08/2023 0811   MONOABS 1.1 (H) 11/08/2023 0811   EOSABS 0.0 11/08/2023 0811   BASOSABS 0.0 11/08/2023 0811   Comprehensive Metabolic Panel:    Component Value Date/Time   NA 138 11/22/2023 0422   NA 136 07/01/2015 1133   K 3.6 11/22/2023 0422   CL 109 11/22/2023 0422   CO2 23 11/22/2023 0422   BUN 14 11/22/2023 0422   BUN 22 07/01/2015 1133   CREATININE 0.91 11/22/2023 0422   CREATININE 1.07 (H) 11/08/2023 0811   CREATININE 0.88 08/03/2023 1203   GLUCOSE 102 (H) 11/22/2023 0422   CALCIUM  8.0 (L) 11/22/2023 0422   AST 22 11/22/2023 0422   AST 27 11/08/2023 0811   ALT 11  11/22/2023 0422   ALT 14 11/08/2023 0811   ALKPHOS 78 11/22/2023 0422   BILITOT 0.6 11/22/2023 0422   BILITOT 0.9 11/08/2023 0811   PROT 5.2 (L) 11/22/2023 0422   PROT 8.0 07/01/2015 1133   ALBUMIN 2.0 (L) 11/22/2023 0422   ALBUMIN 3.9 07/01/2015 1133    RADIOGRAPHIC STUDIES: DG Chest Portable 1 View Result Date: 11/09/2023 CLINICAL DATA:  weakness EXAM: PORTABLE CHEST - 1 VIEW COMPARISON:  10/27/2023 FINDINGS: Right chest port is similarly positioned terminating at the cavoatrial junction. No focal airspace consolidation, pleural effusion, or pneumothorax. No cardiomegaly. Aortic atherosclerosis. No acute fracture or destructive lesions. Multilevel thoracic osteophytosis. Surgical clips in the left upper quadrant. IMPRESSION: No acute cardiopulmonary abnormality. Electronically Signed   By: Rogelia Myers M.D.   On: 11/09/2023 16:59   ECHOCARDIOGRAM LIMITED Result Date: 10/29/2023    ECHOCARDIOGRAM LIMITED REPORT   Patient Name:   TABBETHA KUTSCHER Date of Exam: 10/29/2023 Medical Rec #:  [id number]      Height:       64.0 in Accession #:    [id number]     Weight:       163.1 lb Date of Birth:  26-Dec-1945      BSA:          1.794 m Patient Age:    78 years       BP:           125/55 mmHg Patient Gender: F              HR:           68 bpm. Exam Location:  [facility] Procedure: Limited Echo (Both Spectral and Color Flow Doppler were utilized            during procedure). Indications:  Atrial Flutter I48.92  History:         Patient has prior history of Echocardiogram examinations, most                  recent 10/03/2023.  Sonographer:     Thedora Louder RDCS, FASE Referring Phys:  [phone] CHRISTOPHER END Diagnosing Phys: Caron Poser  Sonographer Comments: This was a Limited Echo requested to reassess the left ventricular function and to evaluate for pericardial effusion. IMPRESSIONS  1. Limited study.  2. Left ventricular ejection fraction, by estimation, is 55 to 60%. The left ventricle has normal  function. Left ventricular diastolic function could not be evaluated.  3. Right ventricular systolic function is normal. The right ventricular size is normal.  4. Moderate pleural effusion. No pericardial effusion seen.  5. The inferior vena cava is normal in size with <50% respiratory variability, suggesting right atrial pressure of 8 mmHg. Comparison(s): A prior study was performed on 10/03/2023. No significant change from prior study. FINDINGS  Left Ventricle: Left ventricular ejection fraction, by estimation, is 55 to 60%. The left ventricle has normal function. The left ventricular internal cavity size was normal in size. There is no left ventricular hypertrophy. Left ventricular diastolic function could not be evaluated. Right Ventricle: The right ventricular size is normal. Right vetricular wall thickness was not well visualized. Right ventricular systolic function is normal. Left Atrium: Left atrial size was not assessed. Right Atrium: Right atrial size was not assessed. Pericardium: There is no evidence of pericardial effusion. Mitral Valve: The mitral valve is normal in structure. No evidence of mitral valve stenosis. Tricuspid Valve: The tricuspid valve is not assessed. Aortic Valve: The aortic valve is tricuspid. No aortic stenosis is present. Pulmonic Valve: The pulmonic valve was not assessed. Aorta: Aortic root could not be assessed. Venous: The inferior vena cava is normal in size with less than 50% respiratory variability, suggesting right atrial pressure of 8 mmHg. IAS/Shunts: There is right bowing of the interatrial septum, suggestive of elevated left atrial pressure. The interatrial septum was not assessed. Additional Comments: There is a moderate pleural effusion.  LEFT VENTRICLE PLAX 2D LVIDd:         4.60 cm LVIDs:         3.20 cm LV PW:         1.00 cm LV IVS:        1.00 cm  LV Volumes (MOD) LV vol d, MOD A4C: 62.1 ml LV vol s, MOD A4C: 25.4 ml LV SV MOD A4C:     62.1 ml LEFT ATRIUM          Index LA diam:    3.70 cm 2.06 cm/m Caron Poser Electronically signed by Caron Poser Signature Date/Time: 10/29/2023/10:38:59 AM    Final    CT HIP RIGHT WO CONTRAST Result Date: 10/27/2023 CLINICAL DATA:  Hip trauma, fracture suspected, xray done EXAM: CT OF THE RIGHT HIP WITHOUT CONTRAST TECHNIQUE: Multidetector CT imaging of the right hip was performed according to the standard protocol. Multiplanar CT image reconstructions were also generated. RADIATION DOSE REDUCTION: This exam was performed according to the departmental dose-optimization program which includes automated exposure control, adjustment of the mA and/or kV according to patient size and/or use of iterative reconstruction technique. COMPARISON:  PET-CT dated 08/22/2023. FINDINGS: Bones/Joint/Cartilage No acute fracture or dislocation. The right femoral head is seated within the acetabulum. Moderate osteoarthritis of the right hip manifested by joint space narrowing and subchondral cystic changes of the superior acetabulum.  Redemonstrated serpiginous sclerotic change along the anteromedial right femoral head, which could reflect avascular necrosis. No evidence of subchondral collapse. 11 mm well corticated ossicle adjacent to the right greater trochanter at the level of the intertrochanteric fossa. The visualized sacroiliac joints and pubic symphysis are intact with mild-to-moderate degenerative arthropathy. Ligaments Ligaments are suboptimally evaluated by CT. Muscles and Tendons No appreciable acute abnormality. Soft tissue No fluid collection or hematoma.  No soft tissue mass. Other: Fibroid uterus. IMPRESSION: 1. No acute osseous abnormality. 2. Moderate osteoarthritis of the right hip. 3. Redemonstrated serpiginous sclerotic change along the anteromedial right femoral head, which could reflect avascular necrosis. No evidence of subchondral collapse. Electronically Signed   By: Harrietta Sherry M.D.   On: 10/27/2023 18:22   DG Chest  Portable 1 View Result Date: 10/27/2023 CLINICAL DATA:  Shortness of breath. EXAM: PORTABLE CHEST 1 VIEW COMPARISON:  August 23, 2021. FINDINGS: Stable cardiomediastinal silhouette. Right internal jugular Port-A-Cath is noted. Lungs are clear. Degenerative changes are seen involving both shoulder joints. IMPRESSION: No active disease. Electronically Signed   By: Lynwood Landy Raddle M.D.   On: 10/27/2023 15:09   DG Pelvis Portable Result Date: 10/27/2023 CLINICAL DATA:  Fall. EXAM: PORTABLE PELVIS 1-2 VIEWS COMPARISON:  None Available. FINDINGS: There is no evidence of pelvic fracture or diastasis. No pelvic bone lesions are seen. IMPRESSION: Negative. Electronically Signed   By: Lynwood Landy Raddle M.D.   On: 10/27/2023 15:07    PERFORMANCE STATUS (ECOG) : 3 - Symptomatic, >50% confined to bed  Review of Systems Unless otherwise noted, a complete review of systems is negative.  Physical Exam General: NAD Cardiovascular: regular rate and rhythm Pulmonary: clear ant fields Abdomen: soft, nontender, + bowel sounds GU: no suprapubic tenderness Extremities: no edema, no joint deformities Skin: no rashes Neurological: Weakness but otherwise nonfocal  IMPRESSION: Patient with multiple recent hospitalizations.  She remains weak with poor performance status.  Rehab has been recommended but insurance has declined.  Patient is in appeal process.  Patient says if insurance declines rehab, she will likely return home with home health.  She says her niece can stay with her if needed.  At baseline, patient was living at home alone but her niece lived nearby.  Patient recognizes that her pancreatic cancer is incurable and ultimately her prognosis is poor.  However, she says that she remains committed to the idea of pursuing chemotherapy as she is not ready to give up.  I did explain that poor performance status cannot exclude the option of chemotherapy and patient would likely need to fully recover prior to  additional treatments.  Patient says that her niece is her healthcare power of attorney.  Discussed CODE STATUS.  Patient states that she would not want to be resuscitated nor have her life prolonged artificially on machines.  She says that she has discussed this with family and all agree with DNR/DNI.  PLAN: - Continue current scope of treatment - DNR/DNI - Will plan outpatient follow-up  Case and plan discussed with Dr. Jacobo and Dr. Jhonny  Time Total: 45  minutes  Visit consisted of counseling and education dealing with the complex and emotionally intense issues of symptom management and palliative care in the setting of serious and potentially life-threatening illness.Greater than 50%  of this time was spent counseling and coordinating care related to the above assessment and plan.  Signed by: Fonda Mower, PhD, NP-C

## 2023-11-24 NOTE — Progress Notes (Signed)
 Occupational Therapy Treatment Patient Details Name: Colleen [name] MRN: [id number] DOB: Apr 10, 1945 Today's Date: 11/24/2023   History of present illness Patient is a 78 year old female with weakness, A-fib with RVR. Recent hospitalization with SIRS due to worsening anemia. PMH: metastasized stage IV pancreatic cancer on chemotherapy, HTN, HLD, prediabetes, COPD/asthma, gout, GERD, CKD-3, anemia, A-fib and PE not on Eliquis , lumbar spinal stenosis   OT comments  Patient seen for OT treatment on this date. Upon arrival to room patient supine in bed, reports getting upsetting news from MD regarding prognosis with stage IV pancreatic cancer, she reports feeling depressed today and not motivated, she is refusing to get out of bed but is  agreeable to perform bed mobility for incontinence care.  Patient performed bed mobility (rolling) with min A while OT performed total care for incontinence care. Patient offered and refused again to get out of bed, stating I will be in a better mood tomorrow, I'm just upset. OT validated patients feelings. Patient ended treatment in bed with bed alarm on and all needs within reach. Patient making fair progress toward goals, will continue to follow POC. Discharge recommendation remains appropriate.        If plan is discharge home, recommend the following:  A lot of help with walking and/or transfers;A lot of help with bathing/dressing/bathroom;Assistance with cooking/housework;Assist for transportation;Help with stairs or ramp for entrance   Equipment Recommendations  Other (comment) (defer to next venue)    Recommendations for Other Services      Precautions / Restrictions Precautions Precautions: Fall Recall of Precautions/Restrictions: Impaired Restrictions Weight Bearing Restrictions Per Provider Order: No       Mobility Bed Mobility Overal bed mobility: Needs Assistance Bed Mobility: Rolling Rolling: Min assist              Transfers                          Balance                                           ADL either performed or assessed with clinical judgement   ADL                               Toileting- Clothing Manipulation and Hygiene: Maximal assistance;Bed level Toileting - Clothing Manipulation Details (indicate cue type and reason): max A bed level refused to get OOB            Extremity/Trunk Assessment              Vision       Perception     Praxis     Communication Communication Communication: No apparent difficulties   Cognition Arousal: Alert Behavior During Therapy: Flat affect                                 Following commands: Intact        Cueing   Cueing Techniques: Verbal cues  Exercises      Shoulder Instructions       General Comments      Pertinent Vitals/ Pain       Pain Assessment Pain Assessment: No/denies pain  Home Living  Prior Functioning/Environment              Frequency  Min 2X/week        Progress Toward Goals  OT Goals(current goals can now be found in the care plan section)     Acute Rehab OT Goals Potential to Achieve Goals: Fair ADL Goals Pt Will Perform Upper Body Dressing: sitting;with modified independence Pt Will Perform Lower Body Dressing: sit to/from stand;with supervision Pt Will Transfer to Toilet: ambulating;with supervision Pt Will Perform Toileting - Clothing Manipulation and hygiene: with modified independence;sitting/lateral leans Additional ADL Goal #1: Pt will verbalize plan to implement at least 1 learned falls prevention strategy.  Plan      Co-evaluation                 AM-PAC OT 6 Clicks Daily Activity     Outcome Measure   Help from another person eating meals?: A Little Help from another person taking care of personal grooming?: A Little Help from another person  toileting, which includes using toliet, bedpan, or urinal?: A Lot Help from another person bathing (including washing, rinsing, drying)?: A Lot Help from another person to put on and taking off regular upper body clothing?: A Little Help from another person to put on and taking off regular lower body clothing?: A Lot 6 Click Score: 15    End of Session    OT Visit Diagnosis: Unsteadiness on feet (R26.81);History of falling (Z91.81);Muscle weakness (generalized) (M62.81)   Activity Tolerance Other (comment) (patient limited by mood, reports feeling depressed as she was recently told there is little hope regarding prognosis)   Patient Left in bed;with call bell/phone within reach;with bed alarm set   Nurse Communication Other (comment) (patient mood/outlook)        Time: 8647-8591 OT Time Calculation (min): 16 min  Charges: OT General Charges $OT Visit: 1 Visit OT Treatments $Self Care/Home Management : 8-22 mins  Rogers Clause, OT/L MSOT, 11/24/2023

## 2023-11-24 NOTE — Plan of Care (Signed)

## 2023-11-24 NOTE — Progress Notes (Signed)
 PROGRESS NOTE    SEMAJA LYMON  FMW:[id number] DOB: 04-Nov-1945 DOA: 11/09/2023 PCP: Leavy Mole, PA-C    Brief Narrative:  Colleen [name] is a 78 y.o. female with medical history significant of metastasized stage IV pancreatic cancer on chemotherapy, HTN, HLD, prediabetes, COPD/asthma, gout, GERD, CKD-3, anemia, A-fib and PE not on Eliquis , lumbar spinal stenosis, who presents with weakness    HPI:  weakness worsening over 1 week. She has poor appetite and decreased oral intake with generalized weakness. Pt states that she was trying to get to the kitchen to make something to eat however she was feeling very weak and lowered herself down to the ground. No fall. Of note, was recently hospitalized from 10/2 - 10/10 due to SIRS due to worsening anemia.  Hemoglobin down to 6.8, received 1 unit of blood transfusion.  Eliquis  was discontinued as recommended by cardiologist.    10/15: admitted for Afib RVR, HR improved w/o intervention.  10/16: still weak, PT/OT recs for SNF, TOC consult placed, see TOC note today.  10/17-10/21: awaiting placement  10/22.  As per [facility] awaiting insurance authorization for rehab. 10/23.  Peer-to-peer done with physician representing insurance company. 10/24.  Patient did not feel well with standing up today.  Unsafe to go home currently.  Midodrine  increased to 10 mg 3 times daily 10/25.  Patient orthostatic.  Will give a fluid bolus.  Family to appeal rehab denial 10/26.  Still waiting insurance company decision on appeal. 10/27.  Awaiting appeal from insurance company 10/28.  Increase midodrine  15 mg 3 times daily.  Still waiting on appeal decision from insurance company   Assessment & Plan:   Principal Problem:   Orthostatic hypotension Active Problems:   Atrial fibrillation with RVR (HCC)   Hypokalemia   Primary pancreatic cancer with metastasis to other site [facility])   Generalized weakness   Hyperlipidemia, unspecified   COPD (chronic obstructive  pulmonary disease) (HCC)   Myocardial injury   Hypophosphatemia   Gout   Chronic kidney disease, stage 3a (HCC)   Normocytic anemia   Obesity (BMI 30-39.9)   Hypomagnesemia  * Orthostatic hypotension Continue midodrine  15mg  3 times daily.  Patient is more motivated and working with physical therapy.  Skilled nursing facility appeal still in progress.  TOC aware and following closely.   Atrial fibrillation with RVR (HCC) Continue amiodarone .  Not on anticoagulation.  Heart rates improved.   Hypokalemia Monitor and replace as needed   Primary pancreatic cancer with metastasis to other site [facility]) Patient with stage IV metastatic pancreatic cancer.  Follow-up with oncology as outpatient.  Oncology aware of admission.  Unable to give chemo as inpatient   Generalized weakness Physical therapy recommending rehab   Hyperlipidemia, unspecified On Lipitor   Hypophosphatemia Replaced   Myocardial injury Likely from fast heart rate.  Unable to tolerate aspirin .  On Lipitor.   Chronic kidney disease, stage 3a (HCC) Last creatinine 0.91 with a GFR greater than 60   Normocytic anemia Last hemoglobin 8.8, ferritin elevated 6446.   Obesity (BMI 30-39.9) BMI 30.8   Hypomagnesemia Replaced IV previously.  On oral magnesium .    DVT prophylaxis: SQ Lovenox  Code Status: Full Family Communication: Niece [name] [place] [phone] on 10/29 Disposition Plan: Status is: Inpatient Remains inpatient appropriate because: Unsafe discharge plan.  Appeal for skilled nursing facility denial is in progress.   Level of care: Med-Surg  Consultants:  None  Procedures:  None  Antimicrobials: None   Subjective: Seen and examined.  More awake today.  Sitting up in chair.  Answer questions appropriately.  Motivated to improve.  Objective: Vitals:   11/23/23 0721 11/23/23 1542 11/23/23 1956 11/24/23 0416  BP:  124/65 (!) 123/57 (!) 99/59  Pulse: (!) 59 64 64 73  Resp:  17 16 16    Temp:   97.7 F (36.5 C) 97.6 F (36.4 C)  TempSrc:   Oral Oral  SpO2:  99% 100% 100%  Weight:      Height:        Intake/Output Summary (Last 24 hours) at 11/24/2023 1113 Last data filed at 11/24/2023 0900 Gross per 24 hour  Intake 0 ml  Output --  Net 0 ml   Filed Weights   11/14/23 0835  Weight: 81.4 kg    Examination:  General exam: NAD.  Energy level improved Respiratory system: Respiratory effort.  Lungs clear.  Room air Cardiovascular system: S1-S2, RRR, no murmurs, no pedal edema Gastrointestinal system: Thin, soft, NT/ND, normal bowel sounds Central nervous system: Alert and oriented. No focal neurological deficits. Extremities: Symmetric decreased power bilaterally Skin: No rashes, lesions or ulcers Psychiatry: Judgement and insight appear within normal limits. Mood & affect appropriate.     Data Reviewed: I have personally reviewed following labs and imaging studies  CBC: Recent Labs  Lab 11/18/23 0528 11/19/23 0612 11/20/23 0738 11/21/23 0547 11/22/23 0422  WBC 12.3*  --   --   --  10.4  HGB 8.2* 8.4* 8.6* 9.4* 8.8*  HCT 24.6*  --   --   --  27.0*  MCV 91.4  --   --   --  93.1  PLT 174  --   --   --  323   Basic Metabolic Panel: Recent Labs  Lab 11/18/23 0528 11/19/23 0612 11/20/23 0738 11/21/23 0547 11/22/23 0422  NA 138 140  --   --  138  K 3.1* 3.4* 3.3* 3.6 3.6  CL 107 108  --   --  109  CO2 24 23  --   --  23  GLUCOSE 95 87  --   --  102*  BUN 12 13  --   --  14  CREATININE 0.85 0.83 0.78  --  0.91  CALCIUM  7.9* 8.1*  --   --  8.0*  MG  --   --  1.7 2.0 2.1   GFR: Estimated Creatinine Clearance: 53.5 mL/min (by C-G formula based on SCr of 0.91 mg/dL). Liver Function Tests: Recent Labs  Lab 11/22/23 0422  AST 22  ALT 11  ALKPHOS 78  BILITOT 0.6  PROT 5.2*  ALBUMIN 2.0*   No results for input(s): LIPASE, AMYLASE in the last 168 hours. No results for input(s): AMMONIA in the last 168 hours. Coagulation  Profile: No results for input(s): INR, PROTIME in the last 168 hours. Cardiac Enzymes: No results for input(s): CKTOTAL, CKMB, CKMBINDEX, TROPONINI in the last 168 hours. BNP (last 3 results) No results for input(s): PROBNP in the last 8760 hours. HbA1C: No results for input(s): HGBA1C in the last 72 hours. CBG: No results for input(s): GLUCAP in the last 168 hours. Lipid Profile: No results for input(s): CHOL, HDL, LDLCALC, TRIG, CHOLHDL, LDLDIRECT in the last 72 hours. Thyroid  Function Tests: No results for input(s): TSH, T4TOTAL, FREET4, T3FREE, THYROIDAB in the last 72 hours. Anemia Panel: No results for input(s): VITAMINB12, FOLATE, FERRITIN, TIBC, IRON, RETICCTPCT in the last 72 hours. Sepsis Labs: No results for input(s): PROCALCITON, LATICACIDVEN in the last 168  hours.  No results found for this or any previous visit (from the past 240 hours).       Radiology Studies: No results found.      Scheduled Meds:  allopurinol   200 mg Oral Daily   amiodarone   200 mg Oral Daily   atorvastatin  10 mg Oral Daily   budesonide -glycopyrrolate -formoterol   2 puff Inhalation BID   Chlorhexidine  Gluconate Cloth  6 each Topical Daily   cholecalciferol  1,000 Units Oral Daily   enoxaparin  (LOVENOX ) injection  40 mg Subcutaneous QHS   famotidine   20 mg Oral Daily   magnesium  chloride  1 tablet Oral Daily   megestrol   20 mg Oral BID   midodrine   15 mg Oral TID WC   multivitamin with minerals  1 tablet Oral Daily   potassium chloride   40 mEq Oral Daily   sodium chloride  flush  10-40 mL Intracatheter Q12H   Continuous Infusions:   LOS: 14 days     Calvin KATHEE Robson, MD [facility]   If 7PM-7AM, please contact night-coverage  11/24/2023, 11:13 AM

## 2023-11-24 NOTE — Progress Notes (Signed)
 Physical Therapy Treatment Patient Details Name: Colleen [name] MRN: [id number] DOB: 01-05-1946 Today's Date: 11/24/2023   History of Present Illness Patient is a 78 year old female with weakness, A-fib with RVR. Recent hospitalization with SIRS due to worsening anemia. PMH: metastasized stage IV pancreatic cancer on chemotherapy, HTN, HLD, prediabetes, COPD/asthma, gout, GERD, CKD-3, anemia, A-fib and PE not on Eliquis , lumbar spinal stenosis    PT Comments  Patient seated in recliner on arrival. Requesting to return to bed after sitting in chair x 3 hours. Required minA to stand and perform step pivot transfer with RW. Complaining of fatigue after short mobility. Able to perform seated exercises focused on LE strengthening. Required minA to return to bed with BLE management. Denies dizziness throughout session. Discharge plan remains appropriate.     If plan is discharge home, recommend the following: A little help with walking and/or transfers;A little help with bathing/dressing/bathroom;Assistance with cooking/housework;Help with stairs or ramp for entrance   Can travel by private vehicle     No  Equipment Recommendations  Other (comment) (TBD at rehab)    Recommendations for Other Services       Precautions / Restrictions Precautions Precautions: Fall Recall of Precautions/Restrictions: Impaired Restrictions Weight Bearing Restrictions Per Provider Order: No     Mobility  Bed Mobility Overal bed mobility: Needs Assistance Bed Mobility: Sit to Supine       Sit to supine: Min assist   General bed mobility comments: assist for BLE management back into bed    Transfers Overall transfer level: Needs assistance Equipment used: Rolling [place] Depascale (2 wheels) Transfers: Sit to/from Stand, Bed to chair/wheelchair/BSC Sit to Stand: Min assist   Step pivot transfers: Min assist       General transfer comment: able to take steps towards bed with minA for balance and use of RW.  No dizziness but patient reports fatigue    Ambulation/Gait                   Stairs             Wheelchair Mobility     Tilt Bed    Modified Rankin (Stroke Patients Only)       Balance Overall balance assessment: Needs assistance Sitting-balance support: Feet supported, No upper extremity supported Sitting balance-Leahy Scale: Fair     Standing balance support: Bilateral upper extremity supported, Reliant on assistive device for balance Standing balance-Leahy Scale: Poor                              Communication Communication Communication: No apparent difficulties  Cognition Arousal: Alert Behavior During Therapy: Flat affect   PT - Cognitive impairments: No apparent impairments                         Following commands: Intact      Cueing Cueing Techniques: Verbal cues  Exercises General Exercises - Lower Extremity Long Arc Quad: AROM, Both, 10 reps, Seated Hip Flexion/Marching: AROM, Both, 10 reps, Seated Toe Raises: AROM, Both, 10 reps, Seated Heel Raises: AROM, Both, 10 reps, Seated    General Comments        Pertinent Vitals/Pain Pain Assessment Pain Assessment: No/denies pain    Home Living                          Prior Function  PT Goals (current goals can now be found in the care plan section) Acute Rehab PT Goals PT Goal Formulation: With patient Time For Goal Achievement: 12/08/23 Potential to Achieve Goals: Good Progress towards PT goals: Progressing toward goals    Frequency    Min 2X/week      PT Plan      Co-evaluation              AM-PAC PT 6 Clicks Mobility   Outcome Measure  Help needed turning from your back to your side while in a flat bed without using bedrails?: A Little Help needed moving from lying on your back to sitting on the side of a flat bed without using bedrails?: A Little Help needed moving to and from a bed to a chair (including a  wheelchair)?: A Little Help needed standing up from a chair using your arms (e.g., wheelchair or bedside chair)?: A Lot Help needed to walk in hospital room?: Total Help needed climbing 3-5 steps with a railing? : Total 6 Click Score: 13    End of Session   Activity Tolerance: Patient tolerated treatment well Patient left: in bed;with bed alarm set;with call bell/phone within reach Nurse Communication: Mobility status PT Visit Diagnosis: Muscle weakness (generalized) (M62.81);Unsteadiness on feet (R26.81)     Time: 1105-1120 PT Time Calculation (min) (ACUTE ONLY): 15 min  Charges:    $Therapeutic Exercise: 8-22 mins PT General Charges $$ ACUTE PT VISIT: 1 Visit                     Maryanne Finder, PT, DPT Physical Therapist - [place]  [facility]   Leza Apsey A Marica Trentham 11/24/2023, 1:20 PM

## 2023-11-24 NOTE — TOC Progression Note (Signed)
 Transition of Care [facility]) - Progression Note    Patient Details  Name: Colleen [name] MRN: [id number] Date of Birth: December 13, 1945  Transition of Care [facility]) CM/SW Contact  Dalia GORMAN Fuse, RN Phone Number: 11/24/2023, 9:05 AM  Clinical Narrative:    WONDA spoke with Ellouise [phone] at [facility]. The appeal for SNF admission to [facility] is still pending, no decision has been made at this time.  TOC will continue to follow for dc planning. DC disposition is SNF at [facility] vs Home with [facility] PT/OT/RN/Aide     Barriers to Discharge: Other (must enter comment) (Awaiting family decision on bed offers)               Expected Discharge Plan and Services         Expected Discharge Date: 11/18/23                                     Social Drivers of Health (SDOH) Interventions SDOH Screenings   Food Insecurity: Food Insecurity Present (11/11/2023)  Housing: Low Risk  (11/11/2023)  Transportation Needs: No Transportation Needs (11/11/2023)  Recent Concern: Transportation Needs - Unmet Transportation Needs (10/01/2023)  Utilities: At Risk (11/11/2023)  Alcohol Screen: Low Risk  (12/02/2022)  Depression (PHQ2-9): Low Risk  (11/08/2023)  Financial Resource Strain: Low Risk  (12/02/2022)  Physical Activity: Insufficiently Active (12/02/2022)  Social Connections: Socially Isolated (11/11/2023)  Stress: No Stress Concern Present (12/02/2022)  Tobacco Use: Medium Risk (11/14/2023)  Health Literacy: Adequate Health Literacy (12/02/2022)    Readmission Risk Interventions    10/28/2023    4:03 PM 10/03/2023    3:20 PM  Readmission Risk Prevention Plan  Transportation Screening Complete Complete  Medication Review Oceanographer) Complete Complete  PCP or Specialist appointment within 3-5 days of discharge Complete Complete  HRI or Home Care Consult Complete   SW Recovery Care/Counseling Consult Complete Complete  Palliative Care Screening Not Applicable Not Applicable   Skilled Nursing Facility Not Applicable Not Applicable

## 2023-11-24 NOTE — Progress Notes (Signed)
 Mobility Specialist - Progress Note     11/24/23 0943  Mobility  Activity Stood at bedside;Pivoted/transferred from bed to chair;Dangled on edge of bed  Level of Assistance Minimal assist, patient does 75% or more  Assistive Device Front wheel walker  Distance Ambulated (ft) 8 ft  Range of Motion/Exercises Active  Activity Response Tolerated well  Mobility Referral Yes  Mobility visit 1 Mobility  Mobility Specialist Start Time (ACUTE ONLY) [id number]  Mobility Specialist Stop Time (ACUTE ONLY) 0943  Mobility Specialist Time Calculation (min) (ACUTE ONLY) 22 min   Pt resting in bed on RA upon entry. Pt pleasant and agreeable to participate in mobility. Pt STS MinA +2 and takes 4 steps forward toward window sill and backward steps to recliner. Pt stand x3 more times to readjust in recliner. Pt left with needs in reach and chair alarm activated.   Guido Rumble Mobility Specialist 11/24/23, 9:52 AM

## 2023-11-25 ENCOUNTER — Encounter: Payer: Self-pay | Admitting: Oncology

## 2023-11-25 DIAGNOSIS — I951 Orthostatic hypotension: Secondary | ICD-10-CM | POA: Diagnosis not present

## 2023-11-25 NOTE — TOC Progression Note (Signed)
 Transition of Care [facility]) - Progression Note    Patient Details  Name: Colleen [name] MRN: [id number] Date of Birth: [birth year]  Transition of Care [facility]) CM/SW Contact  Dalia GORMAN Fuse, RN Phone Number: 11/25/2023, 8:27 AM  Clinical Narrative:     TOC spoke with Jori at [facility], the 1st level appeal was completed and the denial for SNF was upheld. The case went to 2nd level appeal with the [employer] yesterday. The decision is still pending at this time.  TOC will continue to follow.     Barriers to Discharge: Other (must enter comment) (Awaiting family decision on bed offers)               Expected Discharge Plan and Services         Expected Discharge Date: 11/18/23                                     Social Drivers of Health (SDOH) Interventions SDOH Screenings   Food Insecurity: Food Insecurity Present (11/11/2023)  Housing: Low Risk  (11/11/2023)  Transportation Needs: No Transportation Needs (11/11/2023)  Recent Concern: Transportation Needs - Unmet Transportation Needs (10/01/2023)  Utilities: At Risk (11/11/2023)  Alcohol Screen: Low Risk  (12/02/2022)  Depression (PHQ2-9): Low Risk  (11/08/2023)  Financial Resource Strain: Low Risk  (12/02/2022)  Physical Activity: Insufficiently Active (12/02/2022)  Social Connections: Socially Isolated (11/11/2023)  Stress: No Stress Concern Present (12/02/2022)  Tobacco Use: Medium Risk (11/14/2023)  Health Literacy: Adequate Health Literacy (12/02/2022)    Readmission Risk Interventions    10/28/2023    4:03 PM 10/03/2023    3:20 PM  Readmission Risk Prevention Plan  Transportation Screening Complete Complete  Medication Review Oceanographer) Complete Complete  PCP or Specialist appointment within 3-5 days of discharge Complete Complete  HRI or Home Care Consult Complete   SW Recovery Care/Counseling Consult Complete Complete  Palliative Care Screening Not Applicable Not Applicable  Skilled Nursing Facility  Not Applicable Not Applicable

## 2023-11-25 NOTE — Progress Notes (Signed)
 Physical Therapy Treatment Patient Details Name: Colleen [name] MRN: [id number] DOB: 1945/08/05 Today's Date: 11/25/2023   History of Present Illness Patient is a 78 year old female with weakness, A-fib with RVR. Recent hospitalization with SIRS due to worsening anemia. PMH: metastasized stage IV pancreatic cancer on chemotherapy, HTN, HLD, prediabetes, COPD/asthma, gout, GERD, CKD-3, anemia, A-fib and PE not on Eliquis , lumbar spinal stenosis    PT Comments  Pt required encouragement for participation however is agreeable. Pt able to amb 52ft using RW to [facility] without LOB with Min A for RW management. Pt required min A for STS from lowest bed height and step pivot transfer from BSC>recliner.  Pt continues to demonstrate dec activity tolerance and dec strength. Would benefit from skilled PT to address above deficits and promote optimal return to PLOF.    If plan is discharge home, recommend the following: A little help with walking and/or transfers;A little help with bathing/dressing/bathroom;Assistance with cooking/housework;Help with stairs or ramp for entrance   Can travel by private vehicle     No  Equipment Recommendations  Other (comment) (TBD at next venue)    Recommendations for Other Services       Precautions / Restrictions Precautions Precautions: Fall Recall of Precautions/Restrictions: Impaired Restrictions Weight Bearing Restrictions Per Provider Order: No     Mobility  Bed Mobility Overal bed mobility: Needs Assistance Bed Mobility: Supine to Sit     Supine to sit: Min assist, Used rails     General bed mobility comments: HOB slightly elevated. MinA for LE management OOB. Verbal cues for hand placement.    Transfers Overall transfer level: Needs assistance Equipment used: Rolling walker (2 wheels) Transfers: Sit to/from Stand, Bed to chair/wheelchair/BSC Sit to Stand: Min assist   Step pivot transfers: Min assist       General transfer comment: Able to  take steps from the bed to the [facility] which she requests to be against the wall. Min A for lift from lowest bed height. Min A for RW management for step pivot    Ambulation/Gait Ambulation/Gait assistance: Min assist Gait Distance (Feet): 3 Feet Assistive device: Rolling walker (2 wheels) Gait Pattern/deviations: Step-through pattern, Decreased step length - right, Decreased step length - left, Trunk flexed Gait velocity: decreased     General Gait Details: Min A for RW management. Forward flexed posture throughout transfer despite cuing.   Stairs             Wheelchair Mobility     Tilt Bed    Modified Rankin (Stroke Patients Only)       Balance Overall balance assessment: Needs assistance Sitting-balance support: Feet supported, No upper extremity supported Sitting balance-Leahy Scale: Fair Sitting balance - Comments: Able to maintain static sitting at EOB.   Standing balance support: Bilateral upper extremity supported, Reliant on assistive device for balance Standing balance-Leahy Scale: Poor Standing balance comment: Min A to maintain standing balance with heavy UE support on RW                            Communication Communication Communication: No apparent difficulties  Cognition Arousal: Alert Behavior During Therapy: Flat affect   PT - Cognitive impairments: No apparent impairments                       PT - Cognition Comments: needs max encouragement to participate in mobility Following commands: Intact Following commands impaired: Follows one  step commands with increased time    Cueing Cueing Techniques: Verbal cues  Exercises Other Exercises Other Exercises: BP assessed at beginning of session: 121/45mmHg. No endorsement of dizziness throughout session. Other Exercises: Assistance onto [facility]. Max A for pericare.    General Comments General comments (skin integrity, edema, etc.): Pillow positioning in chair for pressure  relief.      Pertinent Vitals/Pain Pain Assessment Pain Assessment: No/denies pain    Home Living                          Prior Function            PT Goals (current goals can now be found in the care plan section) Acute Rehab PT Goals PT Goal Formulation: With patient Time For Goal Achievement: 12/08/23 Potential to Achieve Goals: Fair Progress towards PT goals: Progressing toward goals    Frequency    Min 2X/week      PT Plan      Co-evaluation              AM-PAC PT 6 Clicks Mobility   Outcome Measure  Help needed turning from your back to your side while in a flat bed without using bedrails?: A Little Help needed moving from lying on your back to sitting on the side of a flat bed without using bedrails?: A Little Help needed moving to and from a bed to a chair (including a wheelchair)?: A Little Help needed standing up from a chair using your arms (e.g., wheelchair or bedside chair)?: A Lot Help needed to walk in hospital room?: Total Help needed climbing 3-5 steps with a railing? : Total 6 Click Score: 13    End of Session Equipment Utilized During Treatment: Gait belt Activity Tolerance: Patient tolerated treatment well Patient left: with call bell/phone within reach;in chair;with chair alarm set Nurse Communication: Mobility status PT Visit Diagnosis: Muscle weakness (generalized) (M62.81);Unsteadiness on feet (R26.81)     Time: 8642-8579 PT Time Calculation (min) (ACUTE ONLY): 23 min  Charges:                            Emmely Bittinger, SPT    Alora Gorey 11/25/2023, 2:48 PM

## 2023-11-25 NOTE — Plan of Care (Signed)

## 2023-11-25 NOTE — Progress Notes (Signed)
 PROGRESS NOTE    [name]  FMW:[id number] DOB: December 06, 1945 DOA: 11/09/2023 PCP: Leavy Mole, PA-C    Brief Narrative:  Colleen [name] is a 78 y.o. female with medical history significant of metastasized stage IV pancreatic cancer on chemotherapy, HTN, HLD, prediabetes, COPD/asthma, gout, GERD, CKD-3, anemia, A-fib and PE not on Eliquis , lumbar spinal stenosis, who presents with weakness    HPI:  weakness worsening over 1 week. She has poor appetite and decreased oral intake with generalized weakness. Pt states that she was trying to get to the kitchen to make something to eat however she was feeling very weak and lowered herself down to the ground. No fall. Of note, was recently hospitalized from 10/2 - 10/10 due to SIRS due to worsening anemia.  Hemoglobin down to 6.8, received 1 unit of blood transfusion.  Eliquis  was discontinued as recommended by cardiologist.    10/15: admitted for Afib RVR, HR improved w/o intervention.  10/16: still weak, PT/OT recs for SNF, TOC consult placed, see TOC note today.  10/17-10/21: awaiting placement  10/22.  As per [facility] awaiting insurance authorization for rehab. 10/23.  Peer-to-peer done with physician representing insurance company. 10/24.  Patient did not feel well with standing up today.  Unsafe to go home currently.  Midodrine  increased to 10 mg 3 times daily 10/25.  Patient orthostatic.  Will give a fluid bolus.  Family to appeal rehab denial 10/26.  Still waiting insurance company decision on appeal. 10/27.  Awaiting appeal from insurance company 10/28.  Increase midodrine  15 mg 3 times daily.  Still waiting on appeal decision from insurance company 10/29.  Continue to await decision from insurance regarding appeal   Assessment & Plan:   Principal Problem:   Orthostatic hypotension Active Problems:   Atrial fibrillation with RVR (HCC)   Hypokalemia   Primary pancreatic cancer with metastasis to other site [facility])   Generalized  weakness   Hyperlipidemia, unspecified   COPD (chronic obstructive pulmonary disease) (HCC)   Myocardial injury   Hypophosphatemia   Gout   Chronic kidney disease, stage 3a (HCC)   Normocytic anemia   Obesity (BMI 30-39.9)   Hypomagnesemia   Palliative care encounter  * Orthostatic hypotension Continue midodrine  15mg  3 times daily.  Patient is more motivated and working with physical therapy.  Skilled nursing facility appeal still in progress.  TOC aware and following closely.  Patient would benefit from placement in a skilled nursing facility.   Atrial fibrillation with RVR (HCC) Continue amiodarone .  Not on anticoagulation.  Heart rates improved.   Hypokalemia Monitor and replace as needed   Primary pancreatic cancer with metastasis to other site [facility]) Patient with stage IV metastatic pancreatic cancer.  Follow-up with oncology as outpatient.  Oncology aware of admission.  Palliative care consultation appreciated.  Patient aware of diagnosis.  Continues to desire treatment.  DNR/DNI   Generalized weakness Physical therapy recommending rehab   Hyperlipidemia, unspecified On Lipitor   Hypophosphatemia Replaced   Myocardial injury Likely from fast heart rate.  Unable to tolerate aspirin .  On Lipitor.   Chronic kidney disease, stage 3a (HCC) Last creatinine 0.91 with a GFR greater than 60   Normocytic anemia Last hemoglobin 8.8, ferritin elevated 6446.   Obesity (BMI 30-39.9) BMI 30.8   Hypomagnesemia Replaced IV previously.  On oral magnesium .    DVT prophylaxis: SQ Lovenox  Code Status: DNR/DNI Family Communication: Niece [name] Mt [phone] on 10/29 Disposition Plan: Status is: Inpatient Remains inpatient appropriate because: Unsafe  discharge plan.  Appeal for skilled nursing facility denial is in progress.   Level of care: Med-Surg  Consultants:  None  Procedures:  None  Antimicrobials: None   Subjective: Seen and examined.  Awake and  alert.  Sitting up in bed.  Answers questions appropriately.  Objective: Vitals:   11/24/23 1547 11/24/23 2010 11/25/23 0401 11/25/23 0727  BP:  120/79 96/63 109/61  Pulse: 64 61 70 78  Resp:  18 16 16   Temp:  98.1 F (36.7 C) 98 F (36.7 C) 98.1 F (36.7 C)  TempSrc:      SpO2: 100% 100% 100% 99%  Weight:      Height:        Intake/Output Summary (Last 24 hours) at 11/25/2023 1054 Last data filed at 11/25/2023 0900 Gross per 24 hour  Intake 240 ml  Output --  Net 240 ml   Filed Weights   11/14/23 0835  Weight: 81.4 kg    Examination:  General exam: No acute distress Respiratory system: Respiratory effort.  Lungs clear.  Room air Cardiovascular system: S1-S2, RRR, no murmurs, no pedal edema Gastrointestinal system: Thin, soft, NT/ND, normal bowel sounds Central nervous system: Alert and oriented. No focal neurological deficits. Extremities: Symmetric decreased power bilaterally Skin: No rashes, lesions or ulcers Psychiatry: Judgement and insight appear within normal limits. Mood & affect appropriate.     Data Reviewed: I have personally reviewed following labs and imaging studies  CBC: Recent Labs  Lab 11/19/23 0612 11/20/23 0738 11/21/23 0547 11/22/23 0422  WBC  --   --   --  10.4  HGB 8.4* 8.6* 9.4* 8.8*  HCT  --   --   --  27.0*  MCV  --   --   --  93.1  PLT  --   --   --  323   Basic Metabolic Panel: Recent Labs  Lab 11/19/23 0612 11/20/23 0738 11/21/23 0547 11/22/23 0422  NA 140  --   --  138  K 3.4* 3.3* 3.6 3.6  CL 108  --   --  109  CO2 23  --   --  23  GLUCOSE 87  --   --  102*  BUN 13  --   --  14  CREATININE 0.83 0.78  --  0.91  CALCIUM  8.1*  --   --  8.0*  MG  --  1.7 2.0 2.1   GFR: Estimated Creatinine Clearance: 53.5 mL/min (by C-G formula based on SCr of 0.91 mg/dL). Liver Function Tests: Recent Labs  Lab 11/22/23 0422  AST 22  ALT 11  ALKPHOS 78  BILITOT 0.6  PROT 5.2*  ALBUMIN 2.0*   No results for input(s):  LIPASE, AMYLASE in the last 168 hours. No results for input(s): AMMONIA in the last 168 hours. Coagulation Profile: No results for input(s): INR, PROTIME in the last 168 hours. Cardiac Enzymes: No results for input(s): CKTOTAL, CKMB, CKMBINDEX, TROPONINI in the last 168 hours. BNP (last 3 results) No results for input(s): PROBNP in the last 8760 hours. HbA1C: No results for input(s): HGBA1C in the last 72 hours. CBG: No results for input(s): GLUCAP in the last 168 hours. Lipid Profile: No results for input(s): CHOL, HDL, LDLCALC, TRIG, CHOLHDL, LDLDIRECT in the last 72 hours. Thyroid  Function Tests: No results for input(s): TSH, T4TOTAL, FREET4, T3FREE, THYROIDAB in the last 72 hours. Anemia Panel: No results for input(s): VITAMINB12, FOLATE, FERRITIN, TIBC, IRON, RETICCTPCT in the last 72 hours. Sepsis Labs:  No results for input(s): PROCALCITON, LATICACIDVEN in the last 168 hours.  No results found for this or any previous visit (from the past 240 hours).       Radiology Studies: No results found.      Scheduled Meds:  allopurinol   200 mg Oral Daily   amiodarone   200 mg Oral Daily   atorvastatin  10 mg Oral Daily   budesonide -glycopyrrolate -formoterol   2 puff Inhalation BID   Chlorhexidine  Gluconate Cloth  6 each Topical Daily   cholecalciferol  1,000 Units Oral Daily   enoxaparin  (LOVENOX ) injection  40 mg Subcutaneous QHS   famotidine   20 mg Oral Daily   magnesium  chloride  1 tablet Oral Daily   megestrol   20 mg Oral BID   midodrine   15 mg Oral TID WC   multivitamin with minerals  1 tablet Oral Daily   potassium chloride   40 mEq Oral Daily   sodium chloride  flush  10-40 mL Intracatheter Q12H   Continuous Infusions:   LOS: 15 days     Calvin KATHEE Robson, MD [facility]   If 7PM-7AM, please contact night-coverage  11/25/2023, 10:54 AM

## 2023-11-26 DIAGNOSIS — I951 Orthostatic hypotension: Secondary | ICD-10-CM | POA: Diagnosis not present

## 2023-11-26 MED ORDER — HEPARIN SOD (PORK) LOCK FLUSH 100 UNIT/ML IV SOLN
500.0000 [IU] | INTRAVENOUS | Status: AC | PRN
  Administered 2023-11-26: 500 [IU]

## 2023-11-26 NOTE — Progress Notes (Signed)
 PROGRESS NOTE    Colleen [name]  Colleen [name] DOB: [birth year] DOA: 11/09/2023 PCP: Leavy Mole, PA-C    Brief Narrative:  Colleen [name] is a 78 y.o. female with medical history significant of metastasized stage IV pancreatic cancer on chemotherapy, HTN, HLD, prediabetes, COPD/asthma, gout, GERD, CKD-3, anemia, A-fib and PE not on Eliquis , lumbar spinal stenosis, who presents with weakness    HPI:  weakness worsening over 1 week. She has poor appetite and decreased oral intake with generalized weakness. Pt states that she was trying to get to the kitchen to make something to eat however she was feeling very weak and lowered herself down to the ground. No fall. Of note, was recently hospitalized from 10/2 - 10/10 due to SIRS due to worsening anemia.  Hemoglobin down to 6.8, received 1 unit of blood transfusion.  Eliquis  was discontinued as recommended by cardiologist.    10/15: admitted for Afib RVR, HR improved w/o intervention.  10/16: still weak, PT/OT recs for SNF, TOC consult placed, see TOC note today.  10/17-10/21: awaiting placement  10/22.  As per [facility] awaiting insurance authorization for rehab. 10/23.  Peer-to-peer done with physician representing insurance company. 10/24.  Patient did not feel well with standing up today.  Unsafe to go home currently.  Midodrine  increased to 10 mg 3 times daily 10/25.  Patient orthostatic.  Will give a fluid bolus.  Family to appeal rehab denial 10/26.  Still waiting insurance company decision on appeal. 10/27.  Awaiting appeal from insurance company 10/28.  Increase midodrine  15 mg 3 times daily.  Still waiting on appeal decision from insurance company 10/29.  Continue to await decision from insurance regarding appeal   Assessment & Plan:   Principal Problem:   Orthostatic hypotension Active Problems:   Atrial fibrillation with RVR (HCC)   Hypokalemia   Primary pancreatic cancer with metastasis to other site [facility])   Generalized  weakness   Hyperlipidemia, unspecified   COPD (chronic obstructive pulmonary disease) (HCC)   Myocardial injury   Hypophosphatemia   Gout   Chronic kidney disease, stage 3a (HCC)   Normocytic anemia   Obesity (BMI 30-39.9)   Hypomagnesemia   Palliative care encounter  * Orthostatic hypotension Continue midodrine  15mg  3 times daily.  Patient is more motivated and working with physical therapy.  Skilled nursing facility appeal still in progress.  TOC aware and following closely.  Patient would benefit from placement in a skilled nursing facility.  She is motivated and has recovery potential   Atrial fibrillation with RVR (HCC) Continue amiodarone .  Not on anticoagulation.  Heart rates improved.   Hypokalemia Monitor and replace as needed   Primary pancreatic cancer with metastasis to other site [facility]) Patient with stage IV metastatic pancreatic cancer.  Follow-up with oncology as outpatient.  Oncology aware of admission.  Palliative care consultation appreciated.  Patient aware of diagnosis.  Continues to desire treatment.  DNR/DNI   Generalized weakness Physical therapy recommending rehab   Hyperlipidemia, unspecified On Lipitor   Hypophosphatemia Replaced   Myocardial injury Likely from fast heart rate.  Unable to tolerate aspirin .  On Lipitor.   Chronic kidney disease, stage 3a (HCC) Last creatinine 0.91 with a GFR greater than 60   Normocytic anemia Last hemoglobin 8.8, ferritin elevated 6446.   Obesity (BMI 30-39.9) BMI 30.8   Hypomagnesemia Replaced IV previously.  On oral magnesium .    DVT prophylaxis: SQ Lovenox  Code Status: DNR/DNI Family Communication: Niece Colleen [name] [phone] on 10/29 Disposition Plan:  Status is: Inpatient Remains inpatient appropriate because: Unsafe discharge plan.  Appeal for skilled nursing facility denial is in progress.   Level of care: Med-Surg  Consultants:  None  Procedures:   None  Antimicrobials: None   Subjective: Seen and examined.  Awake and alert.  Sitting up in bed.  Answers all questions appropriately.  No complaints.  Objective: Vitals:   11/25/23 1622 11/25/23 2023 11/26/23 0421 11/26/23 0957  BP: 126/73 127/66 115/62 (!) 84/58  Pulse: 71 63 77 (!) 59  Resp: 17 17 18 16   Temp: 97.9 F (36.6 C) 97.6 F (36.4 C) 98.1 F (36.7 C) 99 F (37.2 C)  TempSrc:      SpO2: 100% 99% 100% 99%  Weight:      Height:        Intake/Output Summary (Last 24 hours) at 11/26/2023 1135 Last data filed at 11/25/2023 1840 Gross per 24 hour  Intake 240 ml  Output --  Net 240 ml   Filed Weights   11/14/23 0835  Weight: 81.4 kg    Examination:  General exam: No acute distress Respiratory system: Respiratory effort.  Lungs clear.  Room air Cardiovascular system: S1-S2, RRR, no murmurs, no pedal edema Gastrointestinal system: Thin, soft, NT/ND, normal bowel sounds Central nervous system: Alert and oriented. No focal neurological deficits. Extremities: Symmetric decreased power bilaterally Skin: No rashes, lesions or ulcers Psychiatry: Judgement and insight appear within normal limits. Mood & affect appropriate.     Data Reviewed: I have personally reviewed following labs and imaging studies  CBC: Recent Labs  Lab 11/20/23 0738 11/21/23 0547 11/22/23 0422  WBC  --   --  10.4  HGB 8.6* 9.4* 8.8*  HCT  --   --  27.0*  MCV  --   --  93.1  PLT  --   --  323   Basic Metabolic Panel: Recent Labs  Lab 11/20/23 0738 11/21/23 0547 11/22/23 0422  NA  --   --  138  K 3.3* 3.6 3.6  CL  --   --  109  CO2  --   --  23  GLUCOSE  --   --  102*  BUN  --   --  14  CREATININE 0.78  --  0.91  CALCIUM   --   --  8.0*  MG 1.7 2.0 2.1   GFR: Estimated Creatinine Clearance: 53.5 mL/min (by C-G formula based on SCr of 0.91 mg/dL). Liver Function Tests: Recent Labs  Lab 11/22/23 0422  AST 22  ALT 11  ALKPHOS 78  BILITOT 0.6  PROT 5.2*  ALBUMIN  2.0*   No results for input(s): LIPASE, AMYLASE in the last 168 hours. No results for input(s): AMMONIA in the last 168 hours. Coagulation Profile: No results for input(s): INR, PROTIME in the last 168 hours. Cardiac Enzymes: No results for input(s): CKTOTAL, CKMB, CKMBINDEX, TROPONINI in the last 168 hours. BNP (last 3 results) No results for input(s): PROBNP in the last 8760 hours. HbA1C: No results for input(s): HGBA1C in the last 72 hours. CBG: No results for input(s): GLUCAP in the last 168 hours. Lipid Profile: No results for input(s): CHOL, HDL, LDLCALC, TRIG, CHOLHDL, LDLDIRECT in the last 72 hours. Thyroid  Function Tests: No results for input(s): TSH, T4TOTAL, FREET4, T3FREE, THYROIDAB in the last 72 hours. Anemia Panel: No results for input(s): VITAMINB12, FOLATE, FERRITIN, TIBC, IRON, RETICCTPCT in the last 72 hours. Sepsis Labs: No results for input(s): PROCALCITON, LATICACIDVEN in the last 168 hours.  No results found for this or any previous visit (from the past 240 hours).       Radiology Studies: No results found.      Scheduled Meds:  allopurinol   200 mg Oral Daily   amiodarone   200 mg Oral Daily   atorvastatin  10 mg Oral Daily   budesonide -glycopyrrolate -formoterol   2 puff Inhalation BID   Chlorhexidine  Gluconate Cloth  6 each Topical Daily   cholecalciferol  1,000 Units Oral Daily   enoxaparin  (LOVENOX ) injection  40 mg Subcutaneous QHS   famotidine   20 mg Oral Daily   magnesium  chloride  1 tablet Oral Daily   megestrol   20 mg Oral BID   midodrine   15 mg Oral TID WC   multivitamin with minerals  1 tablet Oral Daily   potassium chloride   40 mEq Oral Daily   sodium chloride  flush  10-40 mL Intracatheter Q12H   Continuous Infusions:   LOS: 16 days     Calvin KATHEE Robson, MD [facility]   If 7PM-7AM, please contact night-coverage  11/26/2023, 11:35 AM

## 2023-11-26 NOTE — Plan of Care (Signed)
  Problem: Clinical Measurements: Goal: Will remain free from infection Outcome: Progressing Goal: Cardiovascular complication will be avoided Outcome: Progressing   Problem: Nutrition: Goal: Adequate nutrition will be maintained Outcome: Progressing   Problem: Elimination: Goal: Will not experience complications related to bowel motility Outcome: Progressing Goal: Will not experience complications related to urinary retention Outcome: Progressing   Problem: Pain Managment: Goal: General experience of comfort will improve and/or be controlled Outcome: Progressing

## 2023-11-27 DIAGNOSIS — I951 Orthostatic hypotension: Secondary | ICD-10-CM | POA: Diagnosis not present

## 2023-11-27 NOTE — Plan of Care (Signed)
  Problem: Health Behavior/Discharge Planning: Goal: Ability to manage health-related needs will improve Outcome: Progressing   Problem: Clinical Measurements: Goal: Will remain free from infection Outcome: Progressing Goal: Diagnostic test results will improve Outcome: Progressing   Problem: Nutrition: Goal: Adequate nutrition will be maintained Outcome: Progressing   Problem: Pain Managment: Goal: General experience of comfort will improve and/or be controlled Outcome: Progressing

## 2023-11-27 NOTE — Progress Notes (Signed)
 PROGRESS NOTE    Colleen [name]  FMW:[id number] DOB: December 07, 1945 DOA: 11/09/2023 PCP: Leavy Mole, PA-C    Brief Narrative:  Colleen [name] is a 78 y.o. female with medical history significant of metastasized stage IV pancreatic cancer on chemotherapy, HTN, HLD, prediabetes, COPD/asthma, gout, GERD, CKD-3, anemia, A-fib and PE not on Eliquis , lumbar spinal stenosis, who presents with weakness    HPI:  weakness worsening over 1 week. She has poor appetite and decreased oral intake with generalized weakness. Pt states that she was trying to get to the kitchen to make something to eat however she was feeling very weak and lowered herself down to the ground. No fall. Of note, was recently hospitalized from 10/2 - 10/10 due to SIRS due to worsening anemia.  Hemoglobin down to 6.8, received 1 unit of blood transfusion.  Eliquis  was discontinued as recommended by cardiologist.    10/15: admitted for Afib RVR, HR improved w/o intervention.  10/16: still weak, PT/OT recs for SNF, TOC consult placed, see TOC note today.  10/17-10/21: awaiting placement  10/22.  As per [facility] awaiting insurance authorization for rehab. 10/23.  Peer-to-peer done with physician representing insurance company. 10/24.  Patient did not feel well with standing up today.  Unsafe to go home currently.  Midodrine  increased to 10 mg 3 times daily 10/25.  Patient orthostatic.  Will give a fluid bolus.  Family to appeal rehab denial 10/26.  Still waiting insurance company decision on appeal. 10/27.  Awaiting appeal from insurance company 10/28.  Increase midodrine  15 mg 3 times daily.  Still waiting on appeal decision from insurance company 10/29.  Continue to await decision from insurance regarding appeal 11/2: Continue to await insurance decision regarding appeal of skilled nursing facility placement   Assessment & Plan:   Principal Problem:   Orthostatic hypotension Active Problems:   Atrial fibrillation with RVR (HCC)    Hypokalemia   Primary pancreatic cancer with metastasis to other site [facility])   Generalized weakness   Hyperlipidemia, unspecified   COPD (chronic obstructive pulmonary disease) (HCC)   Myocardial injury   Hypophosphatemia   Gout   Chronic kidney disease, stage 3a (HCC)   Normocytic anemia   Obesity (BMI 30-39.9)   Hypomagnesemia   Palliative care encounter  * Orthostatic hypotension Continue midodrine  15mg  3 times daily.  Patient is more motivated and working with physical therapy.  Skilled nursing facility appeal still in progress.  TOC aware and following closely.  It is the recommendation of the medical team the patient be placed in a skilled nursing facility.  She is motivated and does have acute recovery potential.   Atrial fibrillation with RVR (HCC) Continue amiodarone .  Not on anticoagulation.  Heart rates improved.   Hypokalemia Monitor and replace as needed   Primary pancreatic cancer with metastasis to other site [facility]) Patient with stage IV metastatic pancreatic cancer.  Follow-up with oncology as outpatient.  Oncology aware of admission.  Palliative care consultation appreciated.  Patient aware of diagnosis.  Continues to desire treatment.  DNR/DNI   Generalized weakness Physical therapy recommending rehab   Hyperlipidemia, unspecified On Lipitor   Hypophosphatemia Replaced   Myocardial injury Likely from fast heart rate.  Unable to tolerate aspirin .  On Lipitor.   Chronic kidney disease, stage 3a (HCC) Last creatinine 0.91 with a GFR greater than 60   Normocytic anemia Last hemoglobin 8.8, ferritin elevated 6446.   Obesity (BMI 30-39.9) BMI 30.8   Hypomagnesemia Replaced IV previously.  On  oral magnesium .    DVT prophylaxis: SQ Lovenox  Code Status: DNR/DNI Family Communication: Niece [name] Mt [phone] on 10/29 Disposition Plan: Status is: Inpatient Remains inpatient appropriate because: Unsafe discharge plan.  Appeal for skilled nursing  facility denial is in progress.   Level of care: Med-Surg  Consultants:  None  Procedures:  None  Antimicrobials: None   Subjective: Seen and examined.  Awake and alert.  Sitting up in bed.  Answers all questions appropriately.  No complaints.  Objective: Vitals:   11/26/23 0957 11/26/23 1615 11/26/23 1927 11/27/23 0431  BP: (!) 84/58 121/69 123/67 (!) 105/58  Pulse: (!) 59 80 60 63  Resp: 16 16 16    Temp: 99 F (37.2 C) 98.6 F (37 C) (!) 97.4 F (36.3 C) 98.9 F (37.2 C)  TempSrc:      SpO2: 99% 99% 100% 99%  Weight:      Height:        Intake/Output Summary (Last 24 hours) at 11/27/2023 1103 Last data filed at 11/26/2023 1857 Gross per 24 hour  Intake 0 ml  Output --  Net 0 ml   Filed Weights   11/14/23 0835  Weight: 81.4 kg    Examination:  General exam: No acute distress Respiratory system: Respiratory effort.  Lungs clear.  Room air Cardiovascular system: S1-S2, RRR, no murmurs, no pedal edema Gastrointestinal system: Thin, soft, NT/ND, normal bowel sounds Central nervous system: Alert and oriented. No focal neurological deficits. Extremities: Symmetric decreased power bilaterally Skin: No rashes, lesions or ulcers Psychiatry: Judgement and insight appear within normal limits. Mood & affect appropriate.     Data Reviewed: I have personally reviewed following labs and imaging studies  CBC: Recent Labs  Lab 11/21/23 0547 11/22/23 0422  WBC  --  10.4  HGB 9.4* 8.8*  HCT  --  27.0*  MCV  --  93.1  PLT  --  323   Basic Metabolic Panel: Recent Labs  Lab 11/21/23 0547 11/22/23 0422  NA  --  138  K 3.6 3.6  CL  --  109  CO2  --  23  GLUCOSE  --  102*  BUN  --  14  CREATININE  --  0.91  CALCIUM   --  8.0*  MG 2.0 2.1   GFR: Estimated Creatinine Clearance: 53.5 mL/min (by C-G formula based on SCr of 0.91 mg/dL). Liver Function Tests: Recent Labs  Lab 11/22/23 0422  AST 22  ALT 11  ALKPHOS 78  BILITOT 0.6  PROT 5.2*  ALBUMIN  2.0*   No results for input(s): LIPASE, AMYLASE in the last 168 hours. No results for input(s): AMMONIA in the last 168 hours. Coagulation Profile: No results for input(s): INR, PROTIME in the last 168 hours. Cardiac Enzymes: No results for input(s): CKTOTAL, CKMB, CKMBINDEX, TROPONINI in the last 168 hours. BNP (last 3 results) No results for input(s): PROBNP in the last 8760 hours. HbA1C: No results for input(s): HGBA1C in the last 72 hours. CBG: No results for input(s): GLUCAP in the last 168 hours. Lipid Profile: No results for input(s): CHOL, HDL, LDLCALC, TRIG, CHOLHDL, LDLDIRECT in the last 72 hours. Thyroid  Function Tests: No results for input(s): TSH, T4TOTAL, FREET4, T3FREE, THYROIDAB in the last 72 hours. Anemia Panel: No results for input(s): VITAMINB12, FOLATE, FERRITIN, TIBC, IRON, RETICCTPCT in the last 72 hours. Sepsis Labs: No results for input(s): PROCALCITON, LATICACIDVEN in the last 168 hours.  No results found for this or any previous visit (from the past 240  hours).       Radiology Studies: No results found.      Scheduled Meds:  allopurinol   200 mg Oral Daily   amiodarone   200 mg Oral Daily   atorvastatin  10 mg Oral Daily   budesonide -glycopyrrolate -formoterol   2 puff Inhalation BID   cholecalciferol  1,000 Units Oral Daily   enoxaparin  (LOVENOX ) injection  40 mg Subcutaneous QHS   famotidine   20 mg Oral Daily   magnesium  chloride  1 tablet Oral Daily   megestrol   20 mg Oral BID   midodrine   15 mg Oral TID WC   multivitamin with minerals  1 tablet Oral Daily   potassium chloride   40 mEq Oral Daily   sodium chloride  flush  10-40 mL Intracatheter Q12H   Continuous Infusions:   LOS: 17 days     Calvin KATHEE Robson, MD [facility]   If 7PM-7AM, please contact night-coverage  11/27/2023, 11:03 AM

## 2023-11-27 NOTE — Plan of Care (Signed)
  Problem: Health Behavior/Discharge Planning: Goal: Ability to manage health-related needs will improve Outcome: Progressing   Problem: Clinical Measurements: Goal: Will remain free from infection Outcome: Progressing Goal: Cardiovascular complication will be avoided Outcome: Progressing   Problem: Elimination: Goal: Will not experience complications related to bowel motility Outcome: Progressing Goal: Will not experience complications related to urinary retention Outcome: Progressing   Problem: Pain Managment: Goal: General experience of comfort will improve and/or be controlled Outcome: Progressing

## 2023-11-28 ENCOUNTER — Other Ambulatory Visit: Payer: Self-pay

## 2023-11-28 DIAGNOSIS — I951 Orthostatic hypotension: Secondary | ICD-10-CM | POA: Diagnosis not present

## 2023-11-28 MED ORDER — MORPHINE SULFATE (PF) 2 MG/ML IV SOLN: 2.0000 mg | INTRAVENOUS | Status: DC | PRN

## 2023-11-28 NOTE — Plan of Care (Signed)
  Problem: Clinical Measurements: Goal: Ability to maintain clinical measurements within normal limits will improve Outcome: Progressing Goal: Will remain free from infection Outcome: Progressing Goal: Cardiovascular complication will be avoided Outcome: Progressing   Problem: Activity: Goal: Risk for activity intolerance will decrease Outcome: Progressing   Problem: Coping: Goal: Level of anxiety will decrease Outcome: Progressing   Problem: Pain Managment: Goal: General experience of comfort will improve and/or be controlled Outcome: Progressing

## 2023-11-28 NOTE — Progress Notes (Signed)
 OT Cancellation Note  Patient Details Name: Colleen [name] MRN: [id number] DOB: 09/25/1945   Cancelled Treatment:    Reason Eval/Treat Not Completed: Other (comment) (patient refused) OT attempted to see patient, patient adamantly refusing reporting you left me on my toilet pot for hours yesterday and it was filled up. I had to holler and holler and you never came back. OT reassured patient this interaction did not occur as OT was not working yesterday and again encouraged patient to participate, patient continued to refuse. OT discussed that if patient plans to dc home then she needs to be able to transfer up/down from commode and that OT wanted to work on this, patient continues to refuse. OT notified RN of patient refusal.  [name] Clause 11/28/2023, 11:57 AM

## 2023-11-28 NOTE — Progress Notes (Signed)
 Physical Therapy Treatment Patient Details Name: Colleen [name] MRN: [id number] DOB: 1946/01/11 Today's Date: 11/28/2023   History of Present Illness Patient is a 78 year old female with weakness, A-fib with RVR. Recent hospitalization with SIRS due to worsening anemia. PMH: metastasized stage IV pancreatic cancer on chemotherapy, HTN, HLD, prediabetes, COPD/asthma, gout, GERD, CKD-3, anemia, A-fib and PE not on Eliquis , lumbar spinal stenosis    PT Comments  Patient seen for PT session focused on OOB mobility and transfers. At PT entry patient sitting on [facility] with RN, NT and nurse aide in room. PT provided assistance with patient management and transfer to bed.  Patient required mod/maxA x2 from sit <> stand to and from steady lift. RN in room noted that pt. was fatigued from siting on [facility]. Pt after transferring to bed after ~2 min. of recovery was agreeable to PT exercises at  bed level. Limited tolerance to PT session due to fatigue. Interventions aimed at improving functional mobility. Pt. Shows good potential to make progress with continued acute level rehab. Continued skilled PT recommended to progress toward functional goals and support discharge readiness. Pt making good progress toward goals, will continue to follow POC. Discharge recommendation remains appropriate     If plan is discharge home, recommend the following: A little help with walking and/or transfers;A little help with bathing/dressing/bathroom;Assistance with cooking/housework;Help with stairs or ramp for entrance   Can travel by private vehicle     No  Equipment Recommendations  Other (comment) (TBD)    Recommendations for Other Services       Precautions / Restrictions Precautions Precautions: Fall Recall of Precautions/Restrictions: Impaired Precaution/Restrictions Comments: ? orthostatic - may benefit from orthostatic BP's if continues Restrictions Weight Bearing Restrictions Per Provider Order: No      Mobility  Bed Mobility Overal bed mobility: Needs Assistance Bed Mobility: Supine to Sit Rolling: Mod assist, +2 for physical assistance, Max assist     Sit to supine: Mod assist, +2 for physical assistance, Max assist   General bed mobility comments: HOB slightly elevated. mod/maxA x 2 for LE management OOB. Verbal cues for hand placement.    Transfers Overall transfer level: Needs assistance Equipment used: 2 person hand held assist Transfers: Sit to/from Stand, Bed to chair/wheelchair/BSC Sit to Stand: Mod assist, +2 physical assistance, Max assist           General transfer comment: mod/maxA x 2 for safety from [facility] to steady; steady to be; sitting EOB to supine Transfer via Lift Equipment: Stedy  Ambulation/Gait               General Gait Details: gait defferred due to fatigue   Stairs             Wheelchair Mobility     Tilt Bed    Modified Rankin (Stroke Patients Only)       Balance Overall balance assessment: Needs assistance Sitting-balance support: Feet supported, No upper extremity supported Sitting balance-Leahy Scale: Fair Sitting balance - Comments: Able to maintain static sitting at EOB.   Standing balance support: Bilateral upper extremity supported, Reliant on assistive device for balance Standing balance-Leahy Scale: Poor Standing balance comment: Min A to maintain standing balance with heavy UE support on using steady                            Communication Communication Communication: No apparent difficulties  Cognition Arousal: Alert Behavior During Therapy: Flat affect  PT - Cognitive impairments: No apparent impairments                         Following commands: Intact Following commands impaired: Follows one step commands with increased time    Cueing Cueing Techniques: Verbal cues  Exercises General Exercises - Lower Extremity Ankle Circles/Pumps: AROM, Both, 10 reps, Supine Short Arc  Quad: AROM, Both, 10 reps, Supine Heel Slides: Strengthening, 10 reps Hip ABduction/ADduction: Strengthening, 10 reps, AROM Hip Flexion/Marching: AROM, Both, 10 reps, Seated    General Comments        Pertinent Vitals/Pain Pain Assessment Pain Assessment: No/denies pain    Home Living                          Prior Function            PT Goals (current goals can now be found in the care plan section) Acute Rehab PT Goals Patient Stated Goal: eventually to go home PT Goal Formulation: With patient Time For Goal Achievement: 12/08/23 Potential to Achieve Goals: Fair    Frequency    Min 2X/week      PT Plan      Co-evaluation              AM-PAC PT 6 Clicks Mobility   Outcome Measure  Help needed turning from your back to your side while in a flat bed without using bedrails?: A Little Help needed moving from lying on your back to sitting on the side of a flat bed without using bedrails?: A Little Help needed moving to and from a bed to a chair (including a wheelchair)?: A Little Help needed standing up from a chair using your arms (e.g., wheelchair or bedside chair)?: A Lot Help needed to walk in hospital room?: Total Help needed climbing 3-5 steps with a railing? : Total 6 Click Score: 13    End of Session Equipment Utilized During Treatment: Gait belt Activity Tolerance: Patient limited by fatigue Patient left: in bed;with bed alarm set Nurse Communication: Mobility status PT Visit Diagnosis: Muscle weakness (generalized) (M62.81);Unsteadiness on feet (R26.81)     Time: 9067-9054 PT Time Calculation (min) (ACUTE ONLY): 13 min  Charges:    $Therapeutic Activity: 8-22 mins PT General Charges $$ ACUTE PT VISIT: 1 Visit                     Colleen [name] DPT, PT     Colleen [name] 11/28/2023, 9:55 AM

## 2023-11-28 NOTE — TOC Progression Note (Signed)
 Transition of Care [facility]) - Progression Note    Patient Details  Name: Colleen [name] MRN: [id number] Date of Birth: August 14, 1945  Transition of [facility]) CM/SW Contact  Dalia GORMAN Fuse, RN Phone Number: 11/28/2023, 11:14 AM  Clinical Narrative:     TOC lvmm for HTA requesting call back with any updates on the 2nd level appeal. TOC will continue to follow for dc planning. DC dispositions is SNF vs Home with HH.    Barriers to Discharge: Other (must enter comment) (Awaiting family decision on bed offers)               Expected Discharge Plan and Services         Expected Discharge Date: 11/18/23                                     Social Drivers of Health (SDOH) Interventions SDOH Screenings   Food Insecurity: Food Insecurity Present (11/11/2023)  Housing: Low Risk  (11/11/2023)  Transportation Needs: No Transportation Needs (11/11/2023)  Recent Concern: Transportation Needs - Unmet Transportation Needs (10/01/2023)  Utilities: At Risk (11/11/2023)  Alcohol Screen: Low Risk  (12/02/2022)  Depression (PHQ2-9): Low Risk  (11/08/2023)  Financial Resource Strain: Low Risk  (12/02/2022)  Physical Activity: Insufficiently Active (12/02/2022)  Social Connections: Socially Isolated (11/11/2023)  Stress: No Stress Concern Present (12/02/2022)  Tobacco Use: Medium Risk (11/14/2023)  Health Literacy: Adequate Health Literacy (12/02/2022)    Readmission Risk Interventions    10/28/2023    4:03 PM 10/03/2023    3:20 PM  Readmission Risk Prevention Plan  Transportation Screening Complete Complete  Medication Review Oceanographer) Complete Complete  PCP or Specialist appointment within 3-5 days of discharge Complete Complete  HRI or Home Care Consult Complete   SW Recovery Care/Counseling Consult Complete Complete  Palliative Care Screening Not Applicable Not Applicable  Skilled Nursing Facility Not Applicable Not Applicable

## 2023-11-28 NOTE — Progress Notes (Signed)
 PROGRESS NOTE    Colleen [name]  FMW:[id number] DOB: May 17, 1945 DOA: 11/09/2023 PCP: Leavy Mole, PA-C    Brief Narrative:  Colleen [name] is a 78 y.o. female with medical history significant of metastasized stage IV pancreatic cancer on chemotherapy, HTN, HLD, prediabetes, COPD/asthma, gout, GERD, CKD-3, anemia, A-fib and PE not on Eliquis , lumbar spinal stenosis, who presents with weakness    HPI:  weakness worsening over 1 week. She has poor appetite and decreased oral intake with generalized weakness. Pt states that she was trying to get to the kitchen to make something to eat however she was feeling very weak and lowered herself down to the ground. No fall. Of note, was recently hospitalized from 10/2 - 10/10 due to SIRS due to worsening anemia.  Hemoglobin down to 6.8, received 1 unit of blood transfusion.  Eliquis  was discontinued as recommended by cardiologist.    10/15: admitted for Afib RVR, HR improved w/o intervention.  10/16: still weak, PT/OT recs for SNF, TOC consult placed, see TOC note today.  10/17-10/21: awaiting placement  10/22.  As per [facility] awaiting insurance authorization for rehab. 10/23.  Peer-to-peer done with physician representing insurance company. 10/24.  Patient did not feel well with standing up today.  Unsafe to go home currently.  Midodrine  increased to 10 mg 3 times daily 10/25.  Patient orthostatic.  Will give a fluid bolus.  Family to appeal rehab denial 10/26.  Still waiting insurance company decision on appeal. 10/27.  Awaiting appeal from insurance company 10/28.  Increase midodrine  15 mg 3 times daily.  Still waiting on appeal decision from insurance company 10/29.  Continue to await decision from insurance regarding appeal 11/2: Continue to await insurance decision regarding appeal of skilled nursing facility placement   Assessment & Plan:   Principal Problem:   Orthostatic hypotension Active Problems:   Atrial fibrillation with RVR (HCC)    Hypokalemia   Primary pancreatic cancer with metastasis to other site [facility])   Generalized weakness   Hyperlipidemia, unspecified   COPD (chronic obstructive pulmonary disease) (HCC)   Myocardial injury   Hypophosphatemia   Gout   Chronic kidney disease, stage 3a (HCC)   Normocytic anemia   Obesity (BMI 30-39.9)   Hypomagnesemia   Palliative care encounter  * Orthostatic hypotension Continue midodrine  15mg  3 times daily.  Patient is more motivated and working with physical therapy.  Skilled nursing facility appeal still in progress.  TOC aware and following closely.  It is the recommendation of the medical team the patient be placed in a skilled nursing facility.  Patient's mobility and strength is severely limited and home may not be a safe disposition.  Per Occupational Therapy on 11/3 the patient was unable to rise independently from her bedside commode to get back into the bed.  Recommendation from the inpatient medical team continues to be skilled nursing facility.   Atrial fibrillation with RVR (HCC) Continue amiodarone .  Not on anticoagulation.  Heart rates improved.   Hypokalemia Monitor and replace as needed   Primary pancreatic cancer with metastasis to other site [facility]) Patient with stage IV metastatic pancreatic cancer.  Follow-up with oncology as outpatient.  Oncology aware of admission.  Palliative care consultation appreciated.  Patient aware of diagnosis.  Continues to desire treatment.  DNR/DNI   Generalized weakness Physical therapy recommending rehab   Hyperlipidemia, unspecified On Lipitor   Hypophosphatemia Replaced   Myocardial injury Likely from fast heart rate.  Unable to tolerate aspirin .  On Lipitor.  Chronic kidney disease, stage 3a (HCC) Last creatinine 0.91 with a GFR greater than 60   Normocytic anemia Last hemoglobin 8.8, ferritin elevated 6446.   Obesity (BMI 30-39.9) BMI 30.8   Hypomagnesemia Replaced IV previously.  On oral  magnesium .    DVT prophylaxis: SQ Lovenox  Code Status: DNR/DNI Family Communication: Niece Colleen [name] [phone] on 10/29 Disposition Plan: Status is: Inpatient Remains inpatient appropriate because: Unsafe discharge plan.  Appeal for skilled nursing facility denial is in progress.   Level of care: Med-Surg  Consultants:  None  Procedures:  None  Antimicrobials: None   Subjective: Seen and examined.  More sleepy this morning.  Confused about time.  Thinks it is night. Objective: Vitals:   11/27/23 2023 11/27/23 2230 11/28/23 0513 11/28/23 0845  BP: 113/66  117/74 109/64  Pulse: (!) 124 94 60 86  Resp:    18  Temp: 99 F (37.2 C)  97.7 F (36.5 C) 97.6 F (36.4 C)  TempSrc: Oral   Oral  SpO2: 99%  100% 97%  Weight:      Height:       No intake or output data in the 24 hours ending 11/28/23 1324  Filed Weights   11/14/23 0835  Weight: 81.4 kg    Examination:  General exam: NAD.  Confused Respiratory system: Respiratory effort.  Lungs clear.  Room air Cardiovascular system: S1-S2, RRR, no murmurs, no pedal edema Gastrointestinal system: Thin, soft, NT/ND, normal bowel sounds Central nervous system: Alert and oriented. No focal neurological deficits. Extremities: Symmetric decreased power bilaterally Skin: No rashes, lesions or ulcers Psychiatry: Judgement and insight appear impaired. Mood & affect confused.     Data Reviewed: I have personally reviewed following labs and imaging studies  CBC: Recent Labs  Lab 11/22/23 0422  WBC 10.4  HGB 8.8*  HCT 27.0*  MCV 93.1  PLT 323   Basic Metabolic Panel: Recent Labs  Lab 11/22/23 0422  NA 138  K 3.6  CL 109  CO2 23  GLUCOSE 102*  BUN 14  CREATININE 0.91  CALCIUM  8.0*  MG 2.1   GFR: Estimated Creatinine Clearance: 53.5 mL/min (by C-G formula based on SCr of 0.91 mg/dL). Liver Function Tests: Recent Labs  Lab 11/22/23 0422  AST 22  ALT 11  ALKPHOS 78  BILITOT 0.6  PROT 5.2*   ALBUMIN 2.0*   No results for input(s): LIPASE, AMYLASE in the last 168 hours. No results for input(s): AMMONIA in the last 168 hours. Coagulation Profile: No results for input(s): INR, PROTIME in the last 168 hours. Cardiac Enzymes: No results for input(s): CKTOTAL, CKMB, CKMBINDEX, TROPONINI in the last 168 hours. BNP (last 3 results) No results for input(s): PROBNP in the last 8760 hours. HbA1C: No results for input(s): HGBA1C in the last 72 hours. CBG: No results for input(s): GLUCAP in the last 168 hours. Lipid Profile: No results for input(s): CHOL, HDL, LDLCALC, TRIG, CHOLHDL, LDLDIRECT in the last 72 hours. Thyroid  Function Tests: No results for input(s): TSH, T4TOTAL, FREET4, T3FREE, THYROIDAB in the last 72 hours. Anemia Panel: No results for input(s): VITAMINB12, FOLATE, FERRITIN, TIBC, IRON, RETICCTPCT in the last 72 hours. Sepsis Labs: No results for input(s): PROCALCITON, LATICACIDVEN in the last 168 hours.  No results found for this or any previous visit (from the past 240 hours).       Radiology Studies: No results found.      Scheduled Meds:  allopurinol   200 mg Oral Daily   amiodarone   200  mg Oral Daily   atorvastatin  10 mg Oral Daily   budesonide -glycopyrrolate -formoterol   2 puff Inhalation BID   cholecalciferol  1,000 Units Oral Daily   enoxaparin  (LOVENOX ) injection  40 mg Subcutaneous QHS   famotidine   20 mg Oral Daily   magnesium  chloride  1 tablet Oral Daily   megestrol   20 mg Oral BID   midodrine   15 mg Oral TID WC   multivitamin with minerals  1 tablet Oral Daily   potassium chloride   40 mEq Oral Daily   Continuous Infusions:   LOS: 18 days     Calvin KATHEE Robson, MD [facility]   If 7PM-7AM, please contact night-coverage  11/28/2023, 1:24 PM

## 2023-11-29 ENCOUNTER — Inpatient Hospital Stay

## 2023-11-29 ENCOUNTER — Ambulatory Visit

## 2023-11-29 ENCOUNTER — Ambulatory Visit: Admitting: Oncology

## 2023-11-29 ENCOUNTER — Other Ambulatory Visit

## 2023-11-29 ENCOUNTER — Inpatient Hospital Stay: Admitting: Oncology

## 2023-11-29 DIAGNOSIS — I951 Orthostatic hypotension: Secondary | ICD-10-CM | POA: Diagnosis not present

## 2023-11-29 LAB — BASIC METABOLIC PANEL WITH GFR
Anion gap: 6 (ref 5–15)
BUN: 22 mg/dL (ref 8–23)
CO2: 24 mmol/L (ref 22–32)
Calcium: 8.4 mg/dL — ABNORMAL LOW (ref 8.9–10.3)
Chloride: 107 mmol/L (ref 98–111)
Creatinine, Ser: 1.01 mg/dL — ABNORMAL HIGH (ref 0.44–1.00)
GFR, Estimated: 57 mL/min — ABNORMAL LOW (ref >60)
Glucose, Bld: 121 mg/dL — ABNORMAL HIGH (ref 70–99)
Potassium: 4.3 mmol/L (ref 3.5–5.1)
Sodium: 137 mmol/L (ref 135–145)

## 2023-11-29 MED ORDER — FLUDROCORTISONE ACETATE 0.1 MG PO TABS
0.1000 mg | ORAL_TABLET | Freq: Every day | ORAL | Status: DC
  Administered 2023-11-29 – 2023-12-01 (×3): 0.1 mg via ORAL
  Filled 2023-11-29 (×3): qty 1

## 2023-11-29 NOTE — Progress Notes (Signed)
 PROGRESS NOTE    Colleen [name]  FMW:[id number] DOB: 1945-09-05 DOA: 11/09/2023 PCP: Leavy Mole, PA-C    Brief Narrative:  Colleen [name] is a 78 y.o. female with medical history significant of metastasized stage IV pancreatic cancer on chemotherapy, HTN, HLD, prediabetes, COPD/asthma, gout, GERD, CKD-3, anemia, A-fib and PE not on Eliquis , lumbar spinal stenosis, who presents with weakness    HPI:  weakness worsening over 1 week. She has poor appetite and decreased oral intake with generalized weakness. Pt states that she was trying to get to the kitchen to make something to eat however she was feeling very weak and lowered herself down to the ground. No fall. Of note, was recently hospitalized from 10/2 - 10/10 due to SIRS due to worsening anemia.  Hemoglobin down to 6.8, received 1 unit of blood transfusion.  Eliquis  was discontinued as recommended by cardiologist.    10/15: admitted for Afib RVR, HR improved w/o intervention.  10/16: still weak, PT/OT recs for SNF, TOC consult placed, see TOC note today.  10/17-10/21: awaiting placement  10/22.  As per [facility] awaiting insurance authorization for rehab. 10/23.  Peer-to-peer done with physician representing insurance company. 10/24.  Patient did not feel well with standing up today.  Unsafe to go home currently.  Midodrine  increased to 10 mg 3 times daily 10/25.  Patient orthostatic.  Will give a fluid bolus.  Family to appeal rehab denial 10/26.  Still waiting insurance company decision on appeal. 10/27.  Awaiting appeal from insurance company 10/28.  Increase midodrine  15 mg 3 times daily.  Still waiting on appeal decision from insurance company 10/29.  Continue to await decision from insurance regarding appeal 11/2: Continue to await insurance decision regarding appeal of skilled nursing facility placement   Assessment & Plan:   Principal Problem:   Orthostatic hypotension Active Problems:   Atrial fibrillation with RVR (HCC)    Hypokalemia   Primary pancreatic cancer with metastasis to other site [facility])   Generalized weakness   Hyperlipidemia, unspecified   COPD (chronic obstructive pulmonary disease) (HCC)   Myocardial injury   Hypophosphatemia   Gout   Chronic kidney disease, stage 3a (HCC)   Normocytic anemia   Obesity (BMI 30-39.9)   Hypomagnesemia   Palliative care encounter  * Orthostatic hypotension Continue midodrine  15mg  3 times daily.  Patient is more motivated and working with physical therapy.  Skilled nursing facility appeal still in progress.  TOC aware and following closely.  It is the recommendation of the medical team the patient be placed in a skilled nursing facility.  Patient's mobility and strength is severely limited and home may not be a safe disposition.  Per Occupational Therapy on 11/3 the patient was unable to rise independently from her bedside commode to get back into the bed.   Strong recommendation from the inpatient medical team continues to be skilled nursing facility as a next level of care.   Atrial fibrillation with RVR (HCC) Continue amiodarone .  Not on anticoagulation.  Heart rates improved.   Hypokalemia Monitor and replace as needed   Primary pancreatic cancer with metastasis to other site [facility]) Patient with stage IV metastatic pancreatic cancer.  Follow-up with oncology as outpatient.  Oncology aware of admission.  Palliative care consultation appreciated.  Patient aware of diagnosis.  Continues to desire treatment.  DNR/DNI   Generalized weakness Physical therapy recommending rehab   Hyperlipidemia, unspecified On Lipitor   Hypophosphatemia Replaced   Myocardial injury Likely from fast heart rate.  Unable to tolerate aspirin .  On Lipitor.   Chronic kidney disease, stage 3a (HCC) Last creatinine 0.91 with a GFR greater than 60   Normocytic anemia Last hemoglobin 8.8, ferritin elevated 6446.   Obesity (BMI 30-39.9) BMI 30.8   Hypomagnesemia Replaced IV  previously.  On oral magnesium .    DVT prophylaxis: SQ Lovenox  Code Status: DNR/DNI Family Communication: Niece [name] [phone] on 10/29 Disposition Plan: Status is: Inpatient Remains inpatient appropriate because: Unsafe discharge plan.  Appeal for skilled nursing facility denial is in progress.   Level of care: Med-Surg  Consultants:  None  Procedures:  None  Antimicrobials: None   Subjective: Seen and examined.  Awake and alert this morning.  No complaints  Objective: Vitals:   11/28/23 1710 11/28/23 2103 11/29/23 0423 11/29/23 0814  BP: 111/66 122/73 (!) 96/54 (!) 112/54  Pulse: 60 60 (!) 59 80  Resp: 18 18 16 16   Temp: 98.1 F (36.7 C) 98.8 F (37.1 C) 98.2 F (36.8 C) 97.7 F (36.5 C)  TempSrc: Oral Oral Oral Oral  SpO2: 98% 100% 100% 100%  Weight:      Height:        Intake/Output Summary (Last 24 hours) at 11/29/2023 1500 Last data filed at 11/29/2023 1025 Gross per 24 hour  Intake 360 ml  Output --  Net 360 ml    Filed Weights   11/14/23 0835  Weight: 81.4 kg    Examination:  General exam: No acute distress.  Appears frail Respiratory system: Respiratory effort.  Lungs clear.  Room air Cardiovascular system: S1-S2, RRR, no murmurs, no pedal edema Gastrointestinal system: Thin, soft, NT/ND, normal bowel sounds Central nervous system: Alert and oriented. No focal neurological deficits. Extremities: Symmetric decreased power bilaterally Skin: No rashes, lesions or ulcers Psychiatry: Judgement and insight appear impaired. Mood & affect confused.     Data Reviewed: I have personally reviewed following labs and imaging studies  CBC: No results for input(s): WBC, NEUTROABS, HGB, HCT, MCV, PLT in the last 168 hours.  Basic Metabolic Panel: Recent Labs  Lab 11/29/23 1050  NA 137  K 4.3  CL 107  CO2 24  GLUCOSE 121*  BUN 22  CREATININE 1.01*  CALCIUM  8.4*   GFR: Estimated Creatinine Clearance: 48.2 mL/min (A)  (by C-G formula based on SCr of 1.01 mg/dL (H)). Liver Function Tests: No results for input(s): AST, ALT, ALKPHOS, BILITOT, PROT, ALBUMIN in the last 168 hours.  No results for input(s): LIPASE, AMYLASE in the last 168 hours. No results for input(s): AMMONIA in the last 168 hours. Coagulation Profile: No results for input(s): INR, PROTIME in the last 168 hours. Cardiac Enzymes: No results for input(s): CKTOTAL, CKMB, CKMBINDEX, TROPONINI in the last 168 hours. BNP (last 3 results) No results for input(s): PROBNP in the last 8760 hours. HbA1C: No results for input(s): HGBA1C in the last 72 hours. CBG: No results for input(s): GLUCAP in the last 168 hours. Lipid Profile: No results for input(s): CHOL, HDL, LDLCALC, TRIG, CHOLHDL, LDLDIRECT in the last 72 hours. Thyroid  Function Tests: No results for input(s): TSH, T4TOTAL, FREET4, T3FREE, THYROIDAB in the last 72 hours. Anemia Panel: No results for input(s): VITAMINB12, FOLATE, FERRITIN, TIBC, IRON, RETICCTPCT in the last 72 hours. Sepsis Labs: No results for input(s): PROCALCITON, LATICACIDVEN in the last 168 hours.  No results found for this or any previous visit (from the past 240 hours).       Radiology Studies: No results found.  Scheduled Meds:  allopurinol   200 mg Oral Daily   amiodarone   200 mg Oral Daily   atorvastatin  10 mg Oral Daily   budesonide -glycopyrrolate -formoterol   2 puff Inhalation BID   cholecalciferol  1,000 Units Oral Daily   enoxaparin  (LOVENOX ) injection  40 mg Subcutaneous QHS   famotidine   20 mg Oral Daily   magnesium  chloride  1 tablet Oral Daily   megestrol   20 mg Oral BID   midodrine   15 mg Oral TID WC   multivitamin with minerals  1 tablet Oral Daily   potassium chloride   40 mEq Oral Daily   Continuous Infusions:   LOS: 19 days     Calvin KATHEE Robson, MD [facility]   If 7PM-7AM, please  contact night-coverage  11/29/2023, 3:00 PM

## 2023-11-29 NOTE — Progress Notes (Signed)
 Physical Therapy Treatment Patient Details Name: Colleen [name] MRN: [id number] DOB: 1946/01/23 Today's Date: 11/29/2023   History of Present Illness Patient is a 78 year old female with weakness, A-fib with RVR. Recent hospitalization with SIRS due to worsening anemia. PMH: metastasized stage IV pancreatic cancer on chemotherapy, HTN, HLD, prediabetes, COPD/asthma, gout, GERD, CKD-3, anemia, A-fib and PE not on Eliquis , lumbar spinal stenosis    PT Comments  Pt alert, agreeable to participate in PT tx with max encouragement. Pt was received in bed, able to perform BLE exercises with good tolerance. Pt able to transfer to sitting EOB with no physical assistance, heavy use of bed features and increased time/effort to complete task. Pt reported feeling lightheaded upon transfer to sitting EOB, performed BLE exercises to promote increased muscle pump, BP 71/50 (58) in sitting. Pt declined OOB mobility this date, was able to laterally scoot hips up toward [facility] with supervision. Pt returned to supine with minA trunk assist. BP in supine 105/53 (67) with symptom resolution- RN notified of BP readings and pt symptoms. Pt was left semi-reclined in bed at end of session with all needs in reach. The patient would benefit from further skilled PT intervention to continue to progress towards goals.     If plan is discharge home, recommend the following: A lot of help with walking and/or transfers;A lot of help with bathing/dressing/bathroom;Assistance with cooking/housework;Assist for transportation;Help with stairs or ramp for entrance   Can travel by private vehicle     No  Equipment Recommendations  Other (comment) (TBD)    Recommendations for Other Services       Precautions / Restrictions Precautions Precautions: Fall Recall of Precautions/Restrictions: Impaired Restrictions Weight Bearing Restrictions Per Provider Order: No     Mobility  Bed Mobility Overal bed mobility: Needs Assistance Bed  Mobility: Supine to Sit, Sit to Supine     Supine to sit: Supervision, HOB elevated, Used rails Sit to supine: Min assist   General bed mobility comments: pt able to exit bed with no physical assistance, use of bed features and increased time/effort. MinA to assist with trunk positioning returning to bed    Transfers Overall transfer level: Needs assistance Equipment used: None              Lateral/Scoot Transfers: Supervision General transfer comment: pt able to laterally scoot hips up toward HOB x2, additional OOB mobility deferred due to pt being lightheaded while sitting EOB and pt declined standing    Ambulation/Gait               General Gait Details: amb deferred due to pt being lightheaded   Stairs             Wheelchair Mobility     Tilt Bed    Modified Rankin (Stroke Patients Only)       Balance Overall balance assessment: Needs assistance Sitting-balance support: Feet supported, No upper extremity supported Sitting balance-Leahy Scale: Fair Sitting balance - Comments: steady static sitting, requires UE support for dynamic tasks                                    Communication Communication Communication: No apparent difficulties  Cognition Arousal: Alert Behavior During Therapy: Flat affect   PT - Cognitive impairments: No apparent impairments  PT - Cognition Comments: Pt requires max encouragement to participate in mobility Following commands: Intact      Cueing Cueing Techniques: Verbal cues, Visual cues, Tactile cues  Exercises General Exercises - Lower Extremity Ankle Circles/Pumps: AROM, Both, 10 reps, Supine Long Arc Quad: AROM, Both, 10 reps, Seated Heel Raises: AROM, Both, 10 reps, Seated Other Exercises Other Exercises: Pt performed 10 reps each of resisted bilateral hip flexion (leg press) and hip abduction in supine Other Exercises: Vitals assessed during session due to pt  reporting feeling lightheaded and woozy with transfer to sitting: BP 71/50 (58) HR 112bpm SpO2 100% on RA. Upon returning to supine BP 105/53 (67) HR 85bpm SpO2 98% on RA- RN informed    General Comments        Pertinent Vitals/Pain Pain Assessment Pain Assessment: No/denies pain    Home Living                          Prior Function            PT Goals (current goals can now be found in the care plan section) Progress towards PT goals: Progressing toward goals    Frequency    Min 2X/week      PT Plan      Co-evaluation              AM-PAC PT 6 Clicks Mobility   Outcome Measure  Help needed turning from your back to your side while in a flat bed without using bedrails?: A Little Help needed moving from lying on your back to sitting on the side of a flat bed without using bedrails?: A Little Help needed moving to and from a bed to a chair (including a wheelchair)?: A Lot Help needed standing up from a chair using your arms (e.g., wheelchair or bedside chair)?: A Lot Help needed to walk in hospital room?: Total Help needed climbing 3-5 steps with a railing? : Total 6 Click Score: 12    End of Session   Activity Tolerance: Patient limited by fatigue;Other (comment) (limited by lightheadedness) Patient left: in bed;with call bell/phone within reach;with bed alarm set Nurse Communication: Mobility status;Other (comment) (BP in sitting) PT Visit Diagnosis: Muscle weakness (generalized) (M62.81);Unsteadiness on feet (R26.81)     Time: 8987-8967 PT Time Calculation (min) (ACUTE ONLY): 20 min  Charges:    $Therapeutic Exercise: 8-22 mins PT General Charges $$ ACUTE PT VISIT: 1 Visit                    Satine Hausner, SPT

## 2023-11-29 NOTE — Plan of Care (Signed)
  Problem: Health Behavior/Discharge Planning: Goal: Ability to manage health-related needs will improve Outcome: Progressing   Problem: Clinical Measurements: Goal: Will remain free from infection Outcome: Progressing Goal: Cardiovascular complication will be avoided Outcome: Progressing   Problem: Coping: Goal: Level of anxiety will decrease Outcome: Progressing   Problem: Pain Managment: Goal: General experience of comfort will improve and/or be controlled Outcome: Progressing

## 2023-11-29 NOTE — TOC Progression Note (Signed)
 Transition of Care [facility]) - Progression Note    Patient Details  Name: Colleen [name] MRN: [id number] Date of Birth: 1945-08-28  Transition of Care [facility]) CM/SW Contact  Victory Jackquline RAMAN, RN Phone Number: 11/29/2023, 3:39 PM  Clinical Narrative:   RNCM received a secure chat from the MD inquiring about the staus of the 2nd level appeal. I spoke to [place] at [employer] [phone]. She said that Auth # [id number] was denied. MD made aware. He asked if he could do a P2P. I called [employer] back and left a message @ [phone]. Awaiting a call back. MD made aware.  I called the patient's daughter @ [phone] and left a voicemail for her to call me back. RNCM will continue to follow for discharge planning/care coordination and update as applicable.       Barriers to Discharge: Other (must enter comment) (Awaiting family decision on bed offers)               Expected Discharge Plan and Services         Expected Discharge Date: 11/18/23                                     Social Drivers of Health (SDOH) Interventions SDOH Screenings   Food Insecurity: Food Insecurity Present (11/11/2023)  Housing: Low Risk  (11/11/2023)  Transportation Needs: No Transportation Needs (11/11/2023)  Recent Concern: Transportation Needs - Unmet Transportation Needs (10/01/2023)  Utilities: At Risk (11/11/2023)  Alcohol Screen: Low Risk  (12/02/2022)  Depression (PHQ2-9): Low Risk  (11/08/2023)  Financial Resource Strain: Low Risk  (12/02/2022)  Physical Activity: Insufficiently Active (12/02/2022)  Social Connections: Socially Isolated (11/11/2023)  Stress: No Stress Concern Present (12/02/2022)  Tobacco Use: Medium Risk (11/14/2023)  Health Literacy: Adequate Health Literacy (12/02/2022)    Readmission Risk Interventions    10/28/2023    4:03 PM 10/03/2023    3:20 PM  Readmission Risk Prevention Plan  Transportation Screening Complete Complete  Medication Review Oceanographer) Complete Complete   PCP or Specialist appointment within 3-5 days of discharge Complete Complete  HRI or Home Care Consult Complete   SW Recovery Care/Counseling Consult Complete Complete  Palliative Care Screening Not Applicable Not Applicable  Skilled Nursing Facility Not Applicable Not Applicable

## 2023-11-29 NOTE — Progress Notes (Signed)
 OT Cancellation Note  Patient Details Name: Colleen [name] MRN: [id number] DOB: [birth year]   Cancelled Treatment:    Reason Eval/Treat Not Completed: Other (comment). 2x attempts to see pt. First attempt, pt requesting to finish paying bills and eat lunch. Second attempt an hour later, pt still eating lunch. OT will try again as available.  Audel Coakley L. Paden Senger, OTR/L  11/29/23, 2:49 PM

## 2023-11-29 NOTE — Progress Notes (Signed)
 Occupational Therapy Treatment Patient Details Name: Colleen [name] MRN: [id number] DOB: 09/22/1945 Today's Date: 11/29/2023   History of present illness Patient is a 78 year old female with weakness, A-fib with RVR. Recent hospitalization with SIRS due to worsening anemia. PMH: metastasized stage IV pancreatic cancer on chemotherapy, HTN, HLD, prediabetes, COPD/asthma, gout, GERD, CKD-3, anemia, A-fib and PE not on Eliquis , lumbar spinal stenosis   OT comments  Pt making limited progress towards goals 2/2 refusals to participate and orthostatic hypotension. She is agreeable to afternoon session on author's 3rd attempt. Performs bed mobility with MAX A, poor tolerance to upright positioning secondary to dizziness. Pt returned to supine with dizziness resolving. Pt's room smelled strongly of urine, when asked, pt states she has been wet all day and did not notify nursing. Majority of session spent performing pericare/LB bathing with pt requiring MAX A to roll L<>R, MAX A for pericare. Pt reports she has been applying hand sanitizer on towel and placing on peri-area. RN and MD notified. Pt educated on importance of alerting staff when she is wet, and repositioning for pressure relief. Poor skin integrity with pressure and shearing wound noted on buttocks. Pt with poor insight and judgement, currently unsafe to discharge without +2 assist for mobility/ADLs.       If plan is discharge home, recommend the following:  A lot of help with walking and/or transfers;A lot of help with bathing/dressing/bathroom;Assistance with cooking/housework;Assist for transportation;Help with stairs or ramp for entrance   Equipment Recommendations  Other (comment)       Precautions / Restrictions Precautions Precautions: Fall Recall of Precautions/Restrictions: Impaired Precaution/Restrictions Comments: orthostatic Restrictions Weight Bearing Restrictions Per Provider Order: No       Mobility Bed Mobility Overal  bed mobility: Needs Assistance Bed Mobility: Supine to Sit, Sit to Supine Rolling: Max assist, Used rails   Supine to sit: Max assist, HOB elevated, Used rails Sit to supine: Used rails, HOB elevated, Max assist        Transfers Overall transfer level: Needs assistance                 General transfer comment: NT     Balance Overall balance assessment: Needs assistance Sitting-balance support: Feet supported, No upper extremity supported Sitting balance-Leahy Scale: Fair Sitting balance - Comments: poor tolerance under 2 mins                                   ADL either performed or assessed with clinical judgement   ADL Overall ADL's : Needs assistance/impaired             Lower Body Bathing: Maximal assistance;Bed level Lower Body Bathing Details (indicate cue type and reason): pt able to roll L<>R for pericare and LB bathing with MAX A             Toileting- Clothing Manipulation and Hygiene: Bed level;Maximal assistance Toileting - Clothing Manipulation Details (indicate cue type and reason): heavy urine smell noted upon arrival to room. pt does not tell dino that she is wet. when directly asked, she states she has been wet all day and aware but did not tell anyone. soiled brief removed       General ADL Comments: limited tolerance to sitting EOB 2/2 dizziness     Communication Communication Communication: No apparent difficulties   Cognition Arousal: Alert Behavior During Therapy: Flat affect Cognition: Difficult to assess  OT - Cognition Comments: lacks judgement and insight. had been sitting in soaking wet brief all day and did not tell anyone                 Following commands: Intact Following commands impaired: Follows one step commands with increased time      Cueing   Cueing Techniques: Verbal cues, Visual cues, Tactile cues  Exercises Exercises: Other exercises Other Exercises Other Exercises:  edu on ankle pumps and marching in bed while in chair position       General Comments significant skin breakdown on buttocks, vaginal area, pressure wound noted. pt has been sitting in own urine all day, did not tell staff she was wet.    Pertinent Vitals/ Pain       Pain Assessment Pain Assessment: No/denies pain   Frequency  Min 2X/week        Progress Toward Goals  OT Goals(current goals can now be found in the care plan section)  Progress towards OT goals: Progressing toward goals  Acute Rehab OT Goals OT Goal Formulation: With patient Time For Goal Achievement: 12/19/23 Potential to Achieve Goals: Fair ADL Goals Pt Will Perform Upper Body Dressing: sitting;with modified independence Pt Will Perform Lower Body Dressing: sit to/from stand;with supervision Pt Will Transfer to Toilet: ambulating;with supervision Pt Will Perform Toileting - Clothing Manipulation and hygiene: with modified independence;sitting/lateral leans Additional ADL Goal #1: Pt will verbalize plan to implement at least 1 learned falls prevention strategy.  Plan         AM-PAC OT 6 Clicks Daily Activity     Outcome Measure   Help from another person eating meals?: A Little Help from another person taking care of personal grooming?: A Little Help from another person toileting, which includes using toliet, bedpan, or urinal?: A Lot Help from another person bathing (including washing, rinsing, drying)?: A Lot Help from another person to put on and taking off regular upper body clothing?: A Little Help from another person to put on and taking off regular lower body clothing?: A Lot 6 Click Score: 15    End of Session    OT Visit Diagnosis: Unsteadiness on feet (R26.81);History of falling (Z91.81);Muscle weakness (generalized) (M62.81)   Activity Tolerance Other (comment) (limited 2/2 dizziness)   Patient Left in bed;with call bell/phone within reach;with bed alarm set   Nurse Communication  Mobility status (skin integrity)        Time: 1500-1539 OT Time Calculation (min): 39 min  Charges: OT General Charges $OT Visit: 1 Visit OT Treatments $Self Care/Home Management : 38-52 mins  Vimal Derego L. Licia Harl, OTR/L  11/29/23, 4:02 PM

## 2023-11-30 DIAGNOSIS — I951 Orthostatic hypotension: Secondary | ICD-10-CM | POA: Diagnosis not present

## 2023-11-30 NOTE — TOC Progression Note (Signed)
 Transition of Care [facility]) - Progression Note    Patient Details  Name: Colleen [name] MRN: [id number] Date of Birth: January 21, 1946  Transition of Care [facility]) CM/SW Contact  Victory Jackquline RAMAN, RN Phone Number: 11/30/2023, 12:18 PM  Clinical Narrative:   RNCM called HTA @ [phone] to follow up to see if the MD could do a P2P. Left a message. Awaiting a call back. RNCM will continue to follow for discharge planning/care coordination and update as applicable.       Barriers to Discharge: Other (must enter comment) (Awaiting family decision on bed offers)               Expected Discharge Plan and Services         Expected Discharge Date: 11/18/23                                     Social Drivers of Health (SDOH) Interventions SDOH Screenings   Food Insecurity: Food Insecurity Present (11/11/2023)  Housing: Low Risk  (11/11/2023)  Transportation Needs: No Transportation Needs (11/11/2023)  Recent Concern: Transportation Needs - Unmet Transportation Needs (10/01/2023)  Utilities: At Risk (11/11/2023)  Alcohol Screen: Low Risk  (12/02/2022)  Depression (PHQ2-9): Low Risk  (11/08/2023)  Financial Resource Strain: Low Risk  (12/02/2022)  Physical Activity: Insufficiently Active (12/02/2022)  Social Connections: Socially Isolated (11/11/2023)  Stress: No Stress Concern Present (12/02/2022)  Tobacco Use: Medium Risk (11/14/2023)  Health Literacy: Adequate Health Literacy (12/02/2022)    Readmission Risk Interventions    10/28/2023    4:03 PM 10/03/2023    3:20 PM  Readmission Risk Prevention Plan  Transportation Screening Complete Complete  Medication Review Oceanographer) Complete Complete  PCP or Specialist appointment within 3-5 days of discharge Complete Complete  HRI or Home Care Consult Complete   SW Recovery Care/Counseling Consult Complete Complete  Palliative Care Screening Not Applicable Not Applicable  Skilled Nursing Facility Not Applicable Not  Applicable

## 2023-11-30 NOTE — Progress Notes (Signed)
 Physical Therapy Treatment Patient Details Name: Colleen [name] MRN: [id number] DOB: [birth year] Today's Date: 11/30/2023   History of Present Illness Patient is a 78 year old female with weakness, A-fib with RVR. Recent hospitalization with SIRS due to worsening anemia. PMH: metastasized stage IV pancreatic cancer on chemotherapy, HTN, HLD, prediabetes, COPD/asthma, gout, GERD, CKD-3, anemia, A-fib and PE not on Eliquis , lumbar spinal stenosis    PT Comments  Pt alert, agreeable to participate in PT tx this date with encouragement. Pt was received in bed, expressed need to void upon author entering room. Pt able to achieve sitting EOB with supervision and use of bed features, denied dizziness with transfer. Pt performed STS from elevated EOB and BSC with modA to power up to standing. Step-pivot transfer to/from [facility] with minA to guide hips toward sitting surface, maintained trunk flexed on RW throughout, no LOB. Pt was returned to supine with modA BLE assist. Pt left semi-reclined in bed at end of session with all needs in reach. The patient would benefit from further skilled PT intervention to continue to progress towards goals.      If plan is discharge home, recommend the following: A lot of help with walking and/or transfers;A lot of help with bathing/dressing/bathroom;Assistance with cooking/housework;Assist for transportation;Help with stairs or ramp for entrance   Can travel by private vehicle     No  Equipment Recommendations  Other (comment) (TBD)    Recommendations for Other Services       Precautions / Restrictions Precautions Precautions: Fall Recall of Precautions/Restrictions: Impaired Precaution/Restrictions Comments: orthostatic Restrictions Weight Bearing Restrictions Per Provider Order: No     Mobility  Bed Mobility Overal bed mobility: Needs Assistance Bed Mobility: Supine to Sit, Sit to Supine     Supine to sit: Supervision, HOB elevated, Used rails Sit to  supine: Mod assist   General bed mobility comments: able to exit bed with no physical assistance, inc time/effort and use of bed features. ModA to return BLE to bed    Transfers Overall transfer level: Needs assistance Equipment used: Rolling walker (2 wheels) Transfers: Sit to/from Stand, Bed to chair/wheelchair/BSC Sit to Stand: Mod assist, From elevated surface   Step pivot transfers: Min assist       General transfer comment: Pt performed STS from elevated EOB and BSC with modA to power up to standing. MinA for step-pivot transfer to/from [facility]    Ambulation/Gait                   Stairs             Wheelchair Mobility     Tilt Bed    Modified Rankin (Stroke Patients Only)       Balance Overall balance assessment: Needs assistance Sitting-balance support: Feet supported Sitting balance-Leahy Scale: Fair Sitting balance - Comments: steady static sitting   Standing balance support: Bilateral upper extremity supported, Reliant on assistive device for balance Standing balance-Leahy Scale: Poor Standing balance comment: heavy BUE support on RW in standing                            Communication Communication Communication: No apparent difficulties  Cognition Arousal: Alert Behavior During Therapy: Flat affect   PT - Cognitive impairments: Memory                       PT - Cognition Comments: continues to require max encouragement, displays short-term memory  deficits Following commands: Intact      Cueing Cueing Techniques: Verbal cues, Visual cues  Exercises      General Comments General comments (skin integrity, edema, etc.): maxA for pericare in standing      Pertinent Vitals/Pain Pain Assessment Pain Assessment: No/denies pain    Home Living                          Prior Function            PT Goals (current goals can now be found in the care plan section) Progress towards PT goals: Progressing  toward goals    Frequency    Min 2X/week      PT Plan      Co-evaluation              AM-PAC PT 6 Clicks Mobility   Outcome Measure  Help needed turning from your back to your side while in a flat bed without using bedrails?: A Little Help needed moving from lying on your back to sitting on the side of a flat bed without using bedrails?: A Little Help needed moving to and from a bed to a chair (including a wheelchair)?: A Little Help needed standing up from a chair using your arms (e.g., wheelchair or bedside chair)?: A Lot Help needed to walk in hospital room?: Total Help needed climbing 3-5 steps with a railing? : Total 6 Click Score: 13    End of Session Equipment Utilized During Treatment: Gait belt Activity Tolerance: Patient limited by fatigue Patient left: in bed;with call bell/phone within reach;with bed alarm set Nurse Communication: Mobility status PT Visit Diagnosis: Muscle weakness (generalized) (M62.81);Unsteadiness on feet (R26.81)     Time: 8583-8558 PT Time Calculation (min) (ACUTE ONLY): 25 min  Charges:                           Uzair Godley, SPT

## 2023-11-30 NOTE — Progress Notes (Signed)
 PROGRESS NOTE    Colleen [name]  FMW:[id number] DOB: 1945-05-22 DOA: 11/09/2023 PCP: Leavy Mole, PA-C    Brief Narrative:  Colleen [name] is a 78 y.o. female with medical history significant of metastasized stage IV pancreatic cancer on chemotherapy, HTN, HLD, prediabetes, COPD/asthma, gout, GERD, CKD-3, anemia, A-fib and PE not on Eliquis , lumbar spinal stenosis, who presents with weakness    HPI:  weakness worsening over 1 week. She has poor appetite and decreased oral intake with generalized weakness. Pt states that she was trying to get to the kitchen to make something to eat however she was feeling very weak and lowered herself down to the ground. No fall. Of note, was recently hospitalized from 10/2 - 10/10 due to SIRS due to worsening anemia.  Hemoglobin down to 6.8, received 1 unit of blood transfusion.  Eliquis  was discontinued as recommended by cardiologist.    10/15: admitted for Afib RVR, HR improved w/o intervention.  10/16: still weak, PT/OT recs for SNF, TOC consult placed, see TOC note today.  10/17-10/21: awaiting placement  10/22.  As per [facility] awaiting insurance authorization for rehab. 10/23.  Peer-to-peer done with physician representing insurance company. 10/24.  Patient did not feel well with standing up today.  Unsafe to go home currently.  Midodrine  increased to 10 mg 3 times daily 10/25.  Patient orthostatic.  Will give a fluid bolus.  Family to appeal rehab denial 10/26.  Still waiting insurance company decision on appeal. 10/27.  Awaiting appeal from insurance company 10/28.  Increase midodrine  15 mg 3 times daily.  Still waiting on appeal decision from insurance company 10/29.  Continue to await decision from insurance regarding appeal 11/2: Continue to await insurance decision regarding appeal of skilled nursing facility placement 11/4: [facility]TA denied appeal for SNF. Still felt to be unsafe discharge. Started on Florinef 0.1 mg daily 11/5: Reports improvement in  symptoms, denies dizziness on positional changes   Assessment & Plan:   Principal Problem:   Orthostatic hypotension Active Problems:   Atrial fibrillation with RVR (HCC)   Hypokalemia   Primary pancreatic cancer with metastasis to other site [facility])   Generalized weakness   Hyperlipidemia, unspecified   COPD (chronic obstructive pulmonary disease) (HCC)   Myocardial injury   Hypophosphatemia   Gout   Chronic kidney disease, stage 3a (HCC)   Normocytic anemia   Obesity (BMI 30-39.9)   Hypomagnesemia   Palliative care encounter  * Orthostatic hypotension Patient continues to be motivated and working with physical therapy.  It is the recommendation of the medical team the patient be placed in a skilled nursing facility.  Patient's mobility and strength is severely limited and home may not be a safe disposition. Strong recommendation from the inpatient medical team continues to be skilled nursing facility as a next level of care.  - Dizziness on positional changes significantly improved today with addition of florinef yesterday afternoon - Continue midodrine  15mg  3 times daily - Continue florinef 0.1 mg daily  Atrial fibrillation with RVR (HCC) Continue amiodarone .  Not on anticoagulation.  Heart rates improved.   Hypokalemia Monitor and replace as needed   Primary pancreatic cancer with metastasis to other site [facility]) Patient with stage IV metastatic pancreatic cancer.  Follow-up with oncology as outpatient.  Oncology aware of admission.  Palliative care consultation appreciated.  Patient aware of diagnosis.  Continues to desire treatment.  DNR/DNI   Generalized weakness Physical therapy recommending rehab   Hyperlipidemia, unspecified On Lipitor   Hypophosphatemia  Replaced   Myocardial injury Likely from fast heart rate.  Unable to tolerate aspirin .  On Lipitor.   Chronic kidney disease, stage 3a (HCC) Last creatinine 0.91 with a GFR greater than 60   Normocytic  anemia Last hemoglobin 8.8, ferritin elevated 6446.   Obesity (BMI 30-39.9) BMI 30.8   Hypomagnesemia Replaced IV previously.  On oral magnesium .    DVT prophylaxis: SQ Lovenox  Code Status: DNR/DNI Family Communication:  none today Disposition Plan: Status is: Inpatient Remains inpatient appropriate because: Concern for safe discharge, but insurance has denied appeal for SNF. Likely discharge home tomorrow if patient continues to exhibit symptomatic improvement with addition of Florinef, unless insurance offers P2P (waiting to hear back from case management)  Level of care: Med-Surg  Consultants:  None  Procedures:  None  Antimicrobials: None   Subjective: Seen and examined.  Awake and alert this morning.  States dizziness improved when getting up and working with therapy.   Objective: Vitals:   11/29/23 2035 11/30/23 0521 11/30/23 0839 11/30/23 1554  BP: 128/67 (!) 102/55 (!) 102/57 137/72  Pulse: (!) 56 61 (!) 59 71  Resp: 16 16 16    Temp: 98.7 F (37.1 C)  98 F (36.7 C) 97.9 F (36.6 C)  TempSrc: Oral  Oral   SpO2: 100% 100% 100% 100%  Weight:      Height:        Intake/Output Summary (Last 24 hours) at 11/30/2023 2141 Last data filed at 11/30/2023 1900 Gross per 24 hour  Intake 240 ml  Output --  Net 240 ml    Filed Weights   11/14/23 0835  Weight: 81.4 kg    Examination:  General exam: No acute distress.  Appears frail Respiratory system: Respiratory effort.  Lungs clear.  Room air Cardiovascular system: S1-S2, RRR, no murmurs, no pedal edema Gastrointestinal system: Thin, soft, NT/ND, normal bowel sounds Central nervous system: Alert and oriented. No focal neurological deficits. Extremities: Symmetric decreased power bilaterally Skin: No rashes, lesions or ulcers Psychiatry: Judgement and insight appear impaired. Mood & affect confused.    Data Reviewed: I have personally reviewed following labs and imaging studies  CBC: No results for  input(s): WBC, NEUTROABS, HGB, HCT, MCV, PLT in the last 168 hours.  Basic Metabolic Panel: Recent Labs  Lab 11/29/23 1050  NA 137  K 4.3  CL 107  CO2 24  GLUCOSE 121*  BUN 22  CREATININE 1.01*  CALCIUM  8.4*   GFR: Estimated Creatinine Clearance: 48.2 mL/min (A) (by C-G formula based on SCr of 1.01 mg/dL (H)). Liver Function Tests: No results for input(s): AST, ALT, ALKPHOS, BILITOT, PROT, ALBUMIN in the last 168 hours.  No results for input(s): LIPASE, AMYLASE in the last 168 hours. No results for input(s): AMMONIA in the last 168 hours. Coagulation Profile: No results for input(s): INR, PROTIME in the last 168 hours. Cardiac Enzymes: No results for input(s): CKTOTAL, CKMB, CKMBINDEX, TROPONINI in the last 168 hours. BNP (last 3 results) No results for input(s): PROBNP in the last 8760 hours. HbA1C: No results for input(s): HGBA1C in the last 72 hours. CBG: No results for input(s): GLUCAP in the last 168 hours. Lipid Profile: No results for input(s): CHOL, HDL, LDLCALC, TRIG, CHOLHDL, LDLDIRECT in the last 72 hours. Thyroid  Function Tests: No results for input(s): TSH, T4TOTAL, FREET4, T3FREE, THYROIDAB in the last 72 hours. Anemia Panel: No results for input(s): VITAMINB12, FOLATE, FERRITIN, TIBC, IRON, RETICCTPCT in the last 72 hours. Sepsis Labs: No results for input(s):  PROCALCITON, LATICACIDVEN in the last 168 hours.  No results found for this or any previous visit (from the past 240 hours).       Radiology Studies: No results found.      Scheduled Meds:  allopurinol   200 mg Oral Daily   amiodarone   200 mg Oral Daily   atorvastatin  10 mg Oral Daily   budesonide -glycopyrrolate -formoterol   2 puff Inhalation BID   cholecalciferol  1,000 Units Oral Daily   enoxaparin  (LOVENOX ) injection  40 mg Subcutaneous QHS   famotidine   20 mg Oral Daily   fludrocortisone  0.1 mg  Oral Daily   magnesium  chloride  1 tablet Oral Daily   megestrol   20 mg Oral BID   midodrine   15 mg Oral TID WC   multivitamin with minerals  1 tablet Oral Daily   potassium chloride   40 mEq Oral Daily   Continuous Infusions:   LOS: 20 days     Taren Toops Al-Sultani, MD [facility]   If 7PM-7AM, please contact night-coverage  11/30/2023, 9:41 PM

## 2023-12-01 DIAGNOSIS — I951 Orthostatic hypotension: Secondary | ICD-10-CM | POA: Diagnosis not present

## 2023-12-01 MED ORDER — MIDODRINE HCL 5 MG PO TABS: 15.0000 mg | ORAL_TABLET | Freq: Three times a day (TID) | ORAL | 0 refills | Status: DC

## 2023-12-01 MED ORDER — FLUDROCORTISONE ACETATE 0.1 MG PO TABS: 0.1000 mg | ORAL_TABLET | Freq: Every day | ORAL | 0 refills | Status: DC

## 2023-12-01 MED ORDER — ABDOMINAL BINDER/ELASTIC LARGE MISC: 1.0000 [IU] | Freq: Every day | 0 refills | Status: DC

## 2023-12-01 NOTE — TOC Transition Note (Signed)
 Transition of Care [facility]) - Discharge Note   Patient Details  Name: Colleen [name] MRN: [id number] Date of Birth: 09-11-1945  [facility]) CM/SW Contact:  Dalia GORMAN Fuse, RN Phone Number: 12/01/2023, 4:15 PM   Clinical Narrative:     Patient's request for authorization for SNF has been ongoing since 10/20. Request for insurance authorization was submitted on 2 separate occasions and denied. The patient's niece initiated an appeal on 10/25. The decision was upheld and sent to 2nd level review with an external provider. The denial was upheld.   TOC spoke with the patient's niece Clarissa and she is in agreement with the patient returning home with family support and maximum HH services. Referral for [facility] PT/OT/RN/aide accepted by [facility]. [name] with [facility] was made aware of the patient's discharge today. Referral for RW sent to [employer], [place] accepted and will arrange to have the RW delivered to patients.  Clarissa, the patient's niece will transport the patient home.     Final next level of care: Home w Home Health Services Barriers to Discharge: Barriers Resolved   Patient Goals and CMS Choice     Choice offered to / list presented to : [facility] POA / Guardian      Discharge Placement                  Name of family member notified: [name] Patient and family notified of of transfer: 12/01/23  Discharge Plan and Services Additional resources added to the After Visit Summary for                  DME Arranged: Wheelchair manual DME Agency: [employer] Date DME Agency Contacted: 12/01/23 Time DME Agency Contacted: [phone] Representative spoke with at DME Agency: [facility] HH Arranged: RN, PT, OT, Nurse's Aide HH Agency: [facility] Date [facility] Agency Contacted: 12/01/23 Time HH Agency Contacted: [phone] Representative spoke with at [facility] Agency: T  Social Drivers of Health (SDOH) Interventions SDOH Screenings   Food Insecurity: Food Insecurity Present  (11/11/2023)  Housing: Low Risk  (11/11/2023)  Transportation Needs: No Transportation Needs (11/11/2023)  Recent Concern: Transportation Needs - Unmet Transportation Needs (10/01/2023)  Utilities: At Risk (11/11/2023)  Alcohol Screen: Low Risk  (12/02/2022)  Depression (PHQ2-9): Low Risk  (11/08/2023)  Financial Resource Strain: Low Risk  (12/02/2022)  Physical Activity: Insufficiently Active (12/02/2022)  Social Connections: Socially Isolated (11/11/2023)  Stress: No Stress Concern Present (12/02/2022)  Tobacco Use: Medium Risk (11/14/2023)  Health Literacy: Adequate Health Literacy (12/02/2022)     Readmission Risk Interventions    10/28/2023    4:03 PM 10/03/2023    3:20 PM  Readmission Risk Prevention Plan  Transportation Screening Complete Complete  Medication Review Oceanographer) Complete Complete  PCP or Specialist appointment within 3-5 days of discharge Complete Complete  HRI or Home Care Consult Complete   SW Recovery Care/Counseling Consult Complete Complete  Palliative Care Screening Not Applicable Not Applicable  Skilled Nursing Facility Not Applicable Not Applicable

## 2023-12-01 NOTE — Discharge Summary (Signed)
 Physician Discharge Summary   Patient: Colleen [name] MRN: [id number] DOB: 12-12-1945  Admit date:     11/09/2023  Discharge date: 12/01/23  Discharge Physician: Duffy Al-Sultani   PCP: Tapia, Leisa, PA-C   Recommendations at discharge:   Follow up with PCP to continue management of orthostatic hypotension and repeat labs including K and Mg levels Follow up with oncology provider as scheduled  Discharge Diagnoses: Principal Problem:   Orthostatic hypotension Active Problems:   Atrial fibrillation with RVR (HCC)   Hypokalemia   Primary pancreatic cancer with metastasis to other site [facility])   Generalized weakness   Hyperlipidemia, unspecified   COPD (chronic obstructive pulmonary disease) (HCC)   Myocardial injury   Hypophosphatemia   Gout   Chronic kidney disease, stage 3a (HCC)   Normocytic anemia   Obesity (BMI 30-39.9)   Hypomagnesemia   Palliative care encounter  Resolved Problems:   * No resolved hospital problems. *  Hospital Course:  Colleen [name] is a 78 year old female with history of metastatic stage IV pancreatic cancer on chemotherapy, HTN, HLD, prediabetes, COPD/asthma, gout, GERD, CKD stage III, anemia, A-fib, prior PE (not on Eliquis ), and lumbar spinal stenosis, who presented with generalized weakness.  Patient reported feeling generally unwell for approximately 1 week, with poor appetite and decreased oral intake leading to progressive weakness.  While attempting to get to the kitchen, she felt too weak to stand and lowered herself to the ground without injury.  No syncope or trauma reported.  She was recently hospitalized from 10/2 to 10/10 for seizures and worsening anemia (Hgb 6.8, transfused 1 unit PRBCs).  Eliquis  was discontinued during admission per cardiology recommendation.  Hospital course: 10/15: Admitted for weakness and found to be in atrial fibrillation with RVR; rate improved without specific intervention. 10/16: Continued weakness; PT/OT  recommended SNF placement. TOC consulted. 10/17-10/21: Awaiting SNF placement. 10/22: TOC awaiting insurance authorization for rehab. 10/23: Peer-to-peer review completed with insurance physician representative. 10/24: Patient felt unwell on standing; still unsafe for discharge. Midodrine  increased to 10 mg TID. 10/25: Patient remained orthostatic; received IV fluid bolus. Family initiated appeal of rehab denial. 10/26-10/29: Continued awaiting insurance appeal decision. Midodrine  increased to 15 mg TID on 10/28. 11/2: Ongoing wait for insurance decision regarding SNF placement appeal. 11/4: Appeal denied. Patient continued to be unsafe for discharge; started on Florinef 0.1 mg daily. 11/5: Reported symptomatic improvement and denied dizziness with position changes. Expressed desire for discharge due to hospitalization costs; monitored overnight for stability. No response received regarding second peer-to-peer appeal. 11/6: Patient continued to be orthostatic despite medical therapy. Declined TED hose and abdominal binder. Discharged home with maximal home health PT/OT and instructions for close follow-up with PCP and oncology.  Assessment and Plan: Orthostatic hypotension Generalized weakness The patient's orthostatic hypotension and generalized weakness remained an issue at the time of discharge. Given the patient's insurance's denial of SNF placement and the patient's insistence on leaving despite her continued symptomatic orthostatics, there was concern about the patient's discharge home. Efforts to ensure maximal safe discharge included discharge with max assist home health PT/OT, medical optimization with midodrine  15 mg TID and florinef 0.1 mg daily, ted hose and abdominal binder, and education by therapy team. Ultimately, the patient declined the ted hose and abdominal binder at discharge.   Atrial fibrillation with RVR (HCC) Continued on amiodarone .  Not on anticoagulation    Hypokalemia Monitored and replaced as needed   Primary pancreatic cancer with metastasis to other site [facility]) Patient with  stage IV metastatic pancreatic cancer.  Follow-up with oncology as outpatient.  Oncology aware of admission.  Palliative care consultation appreciated.  Patient aware of diagnosis.  Continues to desire treatment.  DNR/DNI   Hyperlipidemia, unspecified On Lipitor   Hypophosphatemia Replaced   Myocardial injury Likely from fast heart rate.  Unable to tolerate aspirin .  On Lipitor.   Chronic kidney disease, stage 3a (HCC) Last creatinine 1.01 with GFR 57   Normocytic anemia Last hemoglobin 8.8, ferritin elevated 6446.   Obesity (BMI 30-39.9) BMI 30.8   Hypomagnesemia Replaced IV previously.  On oral magnesium .  Consultants: Palliative Procedures performed: None  Disposition: Home health Diet recommendation:  Diet Orders (From admission, onward)     Start     Ordered   12/01/23 0000  Diet - low sodium heart healthy        12/01/23 1136            DISCHARGE MEDICATION: Allergies as of 12/01/2023       Reactions   Fentanyl  Swelling   It messes with her heart.    Nsaids    Other Reaction(s): Not available   Oxycodone  Swelling        Medication List     STOP taking these medications    Calcium  600 600 MG Tabs tablet Generic drug: calcium  carbonate       TAKE these medications    Abdominal Binder/Elastic Large Misc 1 Units by Does not apply route daily.   acetaminophen  650 MG CR tablet Commonly known as: TYLENOL  Take 650 mg by mouth every 8 (eight) hours as needed for pain.   Adult Blood Pressure Cuff Lg Kit 1 each by Does not apply route daily.   albuterol  108 (90 Base) MCG/ACT inhaler Commonly known as: VENTOLIN  HFA Inhale 2 puffs into the lungs every 4 (four) hours as needed for wheezing or shortness of breath.   allopurinol  100 MG tablet Commonly known as: ZYLOPRIM  Take 2 tablets (200 mg total) by mouth daily.    amiodarone  200 MG tablet Commonly known as: Pacerone  Take 1 tablet (200 mg total) by mouth daily.   atorvastatin 10 MG tablet Commonly known as: LIPITOR Take 1 tablet (10 mg total) by mouth daily.   Breztri  Aerosphere 160-9-4.8 MCG/ACT Aero inhaler Generic drug: budesonide -glycopyrrolate -formoterol  INHALE 2 PUFFS TWICE DAILY   cholecalciferol 25 MCG (1000 UNIT) tablet Commonly known as: VITAMIN D3 Take 1,000 Units by mouth daily.   docusate sodium 100 MG capsule Commonly known as: COLACE Take 1 capsule (100 mg total) by mouth 2 (two) times daily.   fludrocortisone 0.1 MG tablet Commonly known as: FLORINEF Take 1 tablet (0.1 mg total) by mouth daily. Start taking on: December 02, 2023   lidocaine -prilocaine  cream Commonly known as: EMLA  Apply to affected area once   magic mouthwash (lidocaine , diphenhydrAMINE, alum & mag hydroxide) suspension Swish and spit 5 mLs 4 (four) times daily as needed for mouth pain.   magnesium  chloride 64 MG Tbec SR tablet Commonly known as: SLOW-MAG Take 1 tablet (64 mg total) by mouth daily.   megestrol  20 MG tablet Commonly known as: MEGACE  Take 1 tablet (20 mg total) by mouth 2 (two) times daily.   midodrine  5 MG tablet Commonly known as: PROAMATINE  Take 3 tablets (15 mg total) by mouth 3 (three) times daily with meals.   MULTIVITAMIN WOMEN 50+ PO Take 1 tablet by mouth daily.   ondansetron  8 MG tablet Commonly known as: Zofran  Take 1 tablet (8 mg total) by  mouth every 8 (eight) hours as needed for nausea or vomiting.   PEPCID  PO Take 1 tablet by mouth at bedtime.   polyethylene glycol 17 g packet Commonly known as: MIRALAX  / GLYCOLAX  Take 17 g by mouth 2 (two) times daily as needed for mild constipation. What changed:  when to take this reasons to take this   potassium chloride  SA 20 MEQ tablet Commonly known as: KLOR-CON  M Take 1 tablet (20 mEq total) by mouth daily.   simethicone  80 MG chewable tablet Commonly known  as: Gas-X Chew 1 tablet (80 mg total) by mouth 2 (two) times a day.        Contact information for follow-up providers     Leavy Mole, PA-C Follow up in 5 day(s).   Specialty: Family Medicine Contact information: [address] [place] [address] [phone]         Jacobo Evalene PARAS, MD Follow up.   Specialty: Oncology Why: keep appointment on Tuesday Contact information: [address] [place] [address] [phone]              Contact information for after-discharge care     Destination     [facility] Preferred SNF .   Service: Skilled Nursing Contact information: [address] [place]  [address] [phone]             Home Medical Care     [facility]) .   Service: Home Health Services Contact information: [address] [place]  [address] [phone]                    Discharge Exam: Fredricka Weights   11/14/23 0835  Weight: 81.4 kg   General exam: No acute distress.  Appears frail Respiratory system: Respiratory effort normal. NWB. CTAB. No wheezing or rhonchi. Cardiovascular system: S1-S2, RRR, no murmurs, no pedal edema Gastrointestinal system:  soft, NT/ND, normal bowel sounds Central nervous system: Alert and oriented. No focal neurological deficits. Extremities: Symmetric decreased power bilaterally Skin: No rashes, lesions or ulcers Psychiatry: Judgement and insight appear impaired. Mood & affect confused.   Condition at discharge: poor  The results of significant diagnostics from this hospitalization (including imaging, microbiology, ancillary and laboratory) are listed below for reference.   Imaging Studies: DG Chest Portable 1 View Result Date: 11/09/2023 CLINICAL DATA:  weakness EXAM: PORTABLE CHEST - 1 VIEW COMPARISON:  10/27/2023 FINDINGS: Right chest port is similarly positioned  terminating at the cavoatrial junction. No focal airspace consolidation, pleural effusion, or pneumothorax. No cardiomegaly. Aortic atherosclerosis. No acute fracture or destructive lesions. Multilevel thoracic osteophytosis. Surgical clips in the left upper quadrant. IMPRESSION: No acute cardiopulmonary abnormality. Electronically Signed   By: Rogelia Myers M.D.   On: 11/09/2023 16:59    Microbiology: Results for orders placed or performed during the hospital encounter of 11/09/23  Resp panel by RT-PCR (RSV, Flu A&B, Covid) Urine, Clean Catch     Status: None   Collection Time: 11/09/23  4:50 PM   Specimen: Urine, Clean Catch; Nasal Swab  Result Value Ref Range Status   SARS Coronavirus 2 by RT PCR NEGATIVE NEGATIVE Final    Comment: (NOTE) SARS-CoV-2 target nucleic acids are NOT DETECTED.  The SARS-CoV-2 RNA is generally detectable in upper respiratory specimens during the acute phase of infection. The lowest concentration of SARS-CoV-2 viral copies this assay can detect is 138 copies/mL. A negative result  does not preclude SARS-Cov-2 infection and should not be used as the sole basis for treatment or other patient management decisions. A negative result may occur with  improper specimen collection/handling, submission of specimen other than nasopharyngeal swab, presence of viral mutation(s) within the areas targeted by this assay, and inadequate number of viral copies(<138 copies/mL). A negative result must be combined with clinical observations, patient history, and epidemiological information. The expected result is Negative.  Fact Sheet for Patients:  bloggercourse.com  Fact Sheet for Healthcare Providers:  seriousbroker.it  This test is no t yet approved or cleared by the [place]  FDA and  has been authorized for detection and/or diagnosis of SARS-CoV-2 by FDA under an Emergency Use Authorization (EUA). This EUA will  remain  in effect (meaning this test can be used) for the duration of the COVID-19 declaration under Section 564(b)(1) of the Act, 21 U.S.C.section 360bbb-3(b)(1), unless the authorization is terminated  or revoked sooner.       Influenza A by PCR NEGATIVE NEGATIVE Final   Influenza B by PCR NEGATIVE NEGATIVE Final    Comment: (NOTE) The Xpert Xpress SARS-CoV-2/FLU/RSV plus assay is intended as an aid in the diagnosis of influenza from Nasopharyngeal swab specimens and should not be used as a sole basis for treatment. Nasal washings and aspirates are unacceptable for Xpert Xpress SARS-CoV-2/FLU/RSV testing.  Fact Sheet for Patients: bloggercourse.com  Fact Sheet for Healthcare Providers: seriousbroker.it  This test is not yet approved or cleared by the [place]  FDA and has been authorized for detection and/or diagnosis of SARS-CoV-2 by FDA under an Emergency Use Authorization (EUA). This EUA will remain in effect (meaning this test can be used) for the duration of the COVID-19 declaration under Section 564(b)(1) of the Act, 21 U.S.C. section 360bbb-3(b)(1), unless the authorization is terminated or revoked.     Resp Syncytial Virus by PCR NEGATIVE NEGATIVE Final    Comment: (NOTE) Fact Sheet for Patients: bloggercourse.com  Fact Sheet for Healthcare Providers: seriousbroker.it  This test is not yet approved or cleared by the [place]  FDA and has been authorized for detection and/or diagnosis of SARS-CoV-2 by FDA under an Emergency Use Authorization (EUA). This EUA will remain in effect (meaning this test can be used) for the duration of the COVID-19 declaration under Section 564(b)(1) of the Act, 21 U.S.C. section 360bbb-3(b)(1), unless the authorization is terminated or revoked.  Performed at [facility], [address] [place], [place] [address]      Labs: CBC: No results for input(s): WBC, NEUTROABS, HGB, HCT, MCV, PLT in the last 168 hours. Basic Metabolic Panel: Recent Labs  Lab 11/29/23 1050  NA 137  K 4.3  CL 107  CO2 24  GLUCOSE 121*  BUN 22  CREATININE 1.01*  CALCIUM  8.4*   Liver Function Tests: No results for input(s): AST, ALT, ALKPHOS, BILITOT, PROT, ALBUMIN in the last 168 hours. CBG: No results for input(s): GLUCAP in the last 168 hours.  Discharge time spent: 45 minutes  Signed: Norma Montemurro Al-Sultani, MD [facility] 12/01/2023

## 2023-12-01 NOTE — Plan of Care (Signed)

## 2023-12-01 NOTE — Progress Notes (Addendum)
 Occupational Therapy Treatment Patient Details Name: Colleen [name] MRN: [id number] DOB: May 02, 1945 Today's Date: 12/01/2023   History of present illness Patient is a 78 year old female with weakness, A-fib with RVR. Recent hospitalization with SIRS due to worsening anemia. PMH: metastasized stage IV pancreatic cancer on chemotherapy, HTN, HLD, prediabetes, COPD/asthma, gout, GERD, CKD-3, anemia, A-fib and PE not on Eliquis , lumbar spinal stenosis   OT comments  Pt greeted semi supine, agreeable to OT tx session. Pt is symptomatic with position changes but wants to transfer to bedside commode. She requires MOD A +2 for step pivot via HHA for continent BM, MAX A for peri care. Educated re: potential use of thigh high ted hose/abdominal binder for BP support. Pt unsure despite education provided. Pt will require physical assist for ADL/transfers if she discharges home, continue to recommend STR to facilitate optimal ADL/functional mobility performance.   BP in semi supine 84/54 MAP 62 HR 90 bpm Dizzy Supine: 89/57 (MAP 65) 76 bpm Not dizzy Edge of bed BP 53/45 (MAP 50) HR 101; Dizzy Back to Supine: 108/74 HR 91 bpm - not dizzy, laid flat for approx 2 minutes  Semi supine: 113/67 (MAP 80) HR 64 Not dizzy - pt then transfers to bsc       If plan is discharge home, recommend the following:  A lot of help with walking and/or transfers;A lot of help with bathing/dressing/bathroom;Assistance with cooking/housework;Assist for transportation;Help with stairs or ramp for entrance   Equipment Recommendations  BSC/3in1;Wheelchair/ cushion (measurements OT);Tub/shower seat, hospital bed    Recommendations for Other Services      Precautions / Restrictions Precautions Precautions: Fall Recall of Precautions/Restrictions: Impaired Precaution/Restrictions Comments: orthostatic Restrictions Weight Bearing Restrictions Per Provider Order: No       Mobility Bed Mobility Overal bed mobility: Needs  Assistance Bed Mobility: Supine to Sit, Sit to Supine     Supine to sit: Min assist, HOB elevated Sit to supine: Mod assist, Used rails        Transfers Overall transfer level: Needs assistance Equipment used: Rolling walker (2 wheels), 2 person hand held assist Transfers: Sit to/from Stand Sit to Stand: Mod assist     Step pivot transfers: Mod assist, +2 safety/equipment, +2 physical assistance           Balance Overall balance assessment: Needs assistance Sitting-balance support: Feet supported Sitting balance-Leahy Scale: Fair     Standing balance support: Bilateral upper extremity supported, Reliant on assistive device for balance Standing balance-Leahy Scale: Poor                             ADL either performed or assessed with clinical judgement   ADL Overall ADL's : Needs assistance/impaired             Lower Body Bathing: Maximal assistance;Bed level       Lower Body Dressing: Maximal assistance Lower Body Dressing Details (indicate cue type and reason): doff/donn brief Toilet Transfer: Moderate assistance;BSC/3in1;+2 for physical assistance Toilet Transfer Details (indicate cue type and reason): step pivot via 2 person HHA Toileting- Clothing Manipulation and Hygiene: Maximal assistance;Sit to/from stand Toileting - Clothing Manipulation Details (indicate cue type and reason): continent BM in toilet, noted incontinent urine in brief            Extremity/Trunk Assessment              Vision       Perception  Praxis     Communication Communication Communication: No apparent difficulties   Cognition Arousal: Alert Behavior During Therapy: Flat affect Cognition: Cognition impaired   Orientation impairments: Situation ( youre trying to keep me here to take my money) Awareness: Intellectual awareness impaired Memory impairment (select all impairments): Declarative long-term memory, Short-term memory Attention  impairment (select first level of impairment): Sustained attention Executive functioning impairment (select all impairments): Reasoning, Problem solving                   Following commands: Impaired Following commands impaired: Follows one step commands with increased time      Cueing   Cueing Techniques: Verbal cues, Visual cues  Exercises Other Exercises Other Exercises: edu re role of OT, role of rehab, discharge recommendation    Shoulder Instructions       General Comments pt is dizzy with upright positioning, +orthostatic; see vitals flow sheet    Pertinent Vitals/ Pain       Pain Assessment Pain Assessment: No/denies pain  Home Living                                          Prior Functioning/Environment              Frequency  Min 2X/week        Progress Toward Goals  OT Goals(current goals can now be found in the care plan section)  Progress towards OT goals: Progressing toward goals  Acute Rehab OT Goals Time For Goal Achievement: 12/19/23  Plan      Co-evaluation                 AM-PAC OT 6 Clicks Daily Activity     Outcome Measure   Help from another person eating meals?: A Little Help from another person taking care of personal grooming?: A Little Help from another person toileting, which includes using toliet, bedpan, or urinal?: A Lot Help from another person bathing (including washing, rinsing, drying)?: A Lot Help from another person to put on and taking off regular upper body clothing?: A Little Help from another person to put on and taking off regular lower body clothing?: A Lot 6 Click Score: 15    End of Session Equipment Utilized During Treatment: Rolling walker (2 wheels)  OT Visit Diagnosis: Unsteadiness on feet (R26.81);History of falling (Z91.81);Muscle weakness (generalized) (M62.81)   Activity Tolerance Treatment limited secondary to medical complications (Comment)   Patient Left in  bed;with call bell/phone within reach;with bed alarm set   Nurse Communication Mobility status        Time: 8971-8888 OT Time Calculation (min): 43 min  Charges: OT General Charges $OT Visit: 1 Visit OT Treatments $Self Care/Home Management : 23-37 mins $Therapeutic Activity: 8-22 mins  Therisa Sheffield, OTD OTR/L  12/01/23, 1:46 PM

## 2023-12-05 ENCOUNTER — Telehealth: Payer: Self-pay | Admitting: Family Medicine

## 2023-12-05 NOTE — Telephone Encounter (Signed)
 Copied from CRM [id number]. Topic: Clinical - Home Health Verbal Orders >> Dec 05, 2023  1:46 PM Donee H wrote: Caller/Agency: Nestora from [facility] Callback Number: [phone]  Service Requested: Skilled Nursing Frequency: 2 times a week for wound on buttocks  Any new concerns about the patient? No

## 2023-12-05 NOTE — Telephone Encounter (Signed)
 VO given.

## 2023-12-06 ENCOUNTER — Ambulatory Visit: Admitting: Oncology

## 2023-12-06 ENCOUNTER — Emergency Department

## 2023-12-06 ENCOUNTER — Inpatient Hospital Stay

## 2023-12-06 ENCOUNTER — Other Ambulatory Visit: Payer: Self-pay

## 2023-12-06 ENCOUNTER — Other Ambulatory Visit

## 2023-12-06 ENCOUNTER — Inpatient Hospital Stay: Admitting: Oncology

## 2023-12-06 ENCOUNTER — Inpatient Hospital Stay
Admission: EM | Admit: 2023-12-06 | Discharge: 2023-12-08 | DRG: 309 | Disposition: A | Discharge status: Hospice | Attending: Family Medicine | Admitting: Family Medicine

## 2023-12-06 ENCOUNTER — Inpatient Hospital Stay: Admitting: Family Medicine

## 2023-12-06 ENCOUNTER — Ambulatory Visit

## 2023-12-06 DIAGNOSIS — Z86711 Personal history of pulmonary embolism: Secondary | ICD-10-CM

## 2023-12-06 DIAGNOSIS — K219 Gastro-esophageal reflux disease without esophagitis: Secondary | ICD-10-CM | POA: Diagnosis present

## 2023-12-06 DIAGNOSIS — I4719 Other supraventricular tachycardia: Secondary | ICD-10-CM | POA: Diagnosis not present

## 2023-12-06 DIAGNOSIS — Z7951 Long term (current) use of inhaled steroids: Secondary | ICD-10-CM

## 2023-12-06 DIAGNOSIS — I2510 Atherosclerotic heart disease of native coronary artery without angina pectoris: Secondary | ICD-10-CM | POA: Diagnosis present

## 2023-12-06 DIAGNOSIS — I4821 Permanent atrial fibrillation: Secondary | ICD-10-CM | POA: Diagnosis not present

## 2023-12-06 DIAGNOSIS — E785 Hyperlipidemia, unspecified: Secondary | ICD-10-CM | POA: Diagnosis present

## 2023-12-06 DIAGNOSIS — E876 Hypokalemia: Secondary | ICD-10-CM | POA: Diagnosis present

## 2023-12-06 DIAGNOSIS — Z1152 Encounter for screening for COVID-19: Secondary | ICD-10-CM

## 2023-12-06 DIAGNOSIS — Z66 Do not resuscitate: Secondary | ICD-10-CM | POA: Diagnosis present

## 2023-12-06 DIAGNOSIS — Z86718 Personal history of other venous thrombosis and embolism: Secondary | ICD-10-CM

## 2023-12-06 DIAGNOSIS — I129 Hypertensive chronic kidney disease with stage 1 through stage 4 chronic kidney disease, or unspecified chronic kidney disease: Secondary | ICD-10-CM | POA: Diagnosis present

## 2023-12-06 DIAGNOSIS — R0902 Hypoxemia: Secondary | ICD-10-CM | POA: Diagnosis not present

## 2023-12-06 DIAGNOSIS — Z9221 Personal history of antineoplastic chemotherapy: Secondary | ICD-10-CM

## 2023-12-06 DIAGNOSIS — J4489 Other specified chronic obstructive pulmonary disease: Secondary | ICD-10-CM | POA: Diagnosis present

## 2023-12-06 DIAGNOSIS — I10 Essential (primary) hypertension: Secondary | ICD-10-CM

## 2023-12-06 DIAGNOSIS — Z87891 Personal history of nicotine dependence: Secondary | ICD-10-CM

## 2023-12-06 DIAGNOSIS — D638 Anemia in other chronic diseases classified elsewhere: Secondary | ICD-10-CM | POA: Diagnosis not present

## 2023-12-06 DIAGNOSIS — M109 Gout, unspecified: Secondary | ICD-10-CM | POA: Diagnosis present

## 2023-12-06 DIAGNOSIS — Z515 Encounter for palliative care: Secondary | ICD-10-CM

## 2023-12-06 DIAGNOSIS — A419 Sepsis, unspecified organism: Secondary | ICD-10-CM | POA: Diagnosis not present

## 2023-12-06 DIAGNOSIS — Z5941 Food insecurity: Secondary | ICD-10-CM

## 2023-12-06 DIAGNOSIS — I82412 Acute embolism and thrombosis of left femoral vein: Secondary | ICD-10-CM | POA: Diagnosis not present

## 2023-12-06 DIAGNOSIS — D849 Immunodeficiency, unspecified: Secondary | ICD-10-CM | POA: Diagnosis present

## 2023-12-06 DIAGNOSIS — I82432 Acute embolism and thrombosis of left popliteal vein: Secondary | ICD-10-CM | POA: Diagnosis not present

## 2023-12-06 DIAGNOSIS — R000 Tachycardia, unspecified: Secondary | ICD-10-CM | POA: Diagnosis not present

## 2023-12-06 DIAGNOSIS — Z8249 Family history of ischemic heart disease and other diseases of the circulatory system: Secondary | ICD-10-CM

## 2023-12-06 DIAGNOSIS — I82402 Acute embolism and thrombosis of unspecified deep veins of left lower extremity: Secondary | ICD-10-CM | POA: Diagnosis present

## 2023-12-06 DIAGNOSIS — D6832 Hemorrhagic disorder due to extrinsic circulating anticoagulants: Secondary | ICD-10-CM | POA: Diagnosis not present

## 2023-12-06 DIAGNOSIS — E872 Acidosis, unspecified: Secondary | ICD-10-CM | POA: Diagnosis present

## 2023-12-06 DIAGNOSIS — Z886 Allergy status to analgesic agent status: Secondary | ICD-10-CM

## 2023-12-06 DIAGNOSIS — I959 Hypotension, unspecified: Secondary | ICD-10-CM | POA: Diagnosis not present

## 2023-12-06 DIAGNOSIS — C259 Malignant neoplasm of pancreas, unspecified: Secondary | ICD-10-CM | POA: Diagnosis present

## 2023-12-06 DIAGNOSIS — R55 Syncope and collapse: Secondary | ICD-10-CM | POA: Diagnosis not present

## 2023-12-06 DIAGNOSIS — R7989 Other specified abnormal findings of blood chemistry: Secondary | ICD-10-CM | POA: Diagnosis present

## 2023-12-06 DIAGNOSIS — I82519 Chronic embolism and thrombosis of unspecified femoral vein: Secondary | ICD-10-CM | POA: Diagnosis not present

## 2023-12-06 DIAGNOSIS — R404 Transient alteration of awareness: Secondary | ICD-10-CM | POA: Diagnosis not present

## 2023-12-06 DIAGNOSIS — K922 Gastrointestinal hemorrhage, unspecified: Secondary | ICD-10-CM | POA: Diagnosis not present

## 2023-12-06 DIAGNOSIS — I480 Paroxysmal atrial fibrillation: Principal | ICD-10-CM | POA: Diagnosis present

## 2023-12-06 DIAGNOSIS — M48061 Spinal stenosis, lumbar region without neurogenic claudication: Secondary | ICD-10-CM | POA: Diagnosis present

## 2023-12-06 DIAGNOSIS — Z7952 Long term (current) use of systemic steroids: Secondary | ICD-10-CM

## 2023-12-06 DIAGNOSIS — Z951 Presence of aortocoronary bypass graft: Secondary | ICD-10-CM

## 2023-12-06 DIAGNOSIS — I951 Orthostatic hypotension: Secondary | ICD-10-CM | POA: Diagnosis not present

## 2023-12-06 DIAGNOSIS — Z885 Allergy status to narcotic agent status: Secondary | ICD-10-CM

## 2023-12-06 DIAGNOSIS — R7303 Prediabetes: Secondary | ICD-10-CM | POA: Diagnosis present

## 2023-12-06 DIAGNOSIS — L89320 Pressure ulcer of left buttock, unstageable: Secondary | ICD-10-CM | POA: Diagnosis present

## 2023-12-06 DIAGNOSIS — R600 Localized edema: Secondary | ICD-10-CM | POA: Diagnosis not present

## 2023-12-06 DIAGNOSIS — R531 Weakness: Secondary | ICD-10-CM | POA: Diagnosis not present

## 2023-12-06 DIAGNOSIS — Z7901 Long term (current) use of anticoagulants: Secondary | ICD-10-CM

## 2023-12-06 DIAGNOSIS — I4891 Unspecified atrial fibrillation: Secondary | ICD-10-CM | POA: Diagnosis present

## 2023-12-06 DIAGNOSIS — Z5982 Transportation insecurity: Secondary | ICD-10-CM

## 2023-12-06 DIAGNOSIS — Z833 Family history of diabetes mellitus: Secondary | ICD-10-CM

## 2023-12-06 DIAGNOSIS — Z79899 Other long term (current) drug therapy: Secondary | ICD-10-CM

## 2023-12-06 DIAGNOSIS — N183 Chronic kidney disease, stage 3 unspecified: Secondary | ICD-10-CM | POA: Diagnosis present

## 2023-12-06 DIAGNOSIS — T45515A Adverse effect of anticoagulants, initial encounter: Secondary | ICD-10-CM | POA: Diagnosis not present

## 2023-12-06 DIAGNOSIS — K921 Melena: Secondary | ICD-10-CM | POA: Diagnosis not present

## 2023-12-06 LAB — CBC WITH DIFFERENTIAL/PLATELET
Abs Immature Granulocytes: 0.17 K/uL — ABNORMAL HIGH (ref 0.00–0.07)
Basophils Absolute: 0.1 K/uL (ref 0.0–0.1)
Basophils Relative: 0 %
Eosinophils Absolute: 0 K/uL (ref 0.0–0.5)
Eosinophils Relative: 0 %
HCT: 33.2 % — ABNORMAL LOW (ref 36.0–46.0)
Hemoglobin: 10.6 g/dL — ABNORMAL LOW (ref 12.0–15.0)
Immature Granulocytes: 1 %
Lymphocytes Relative: 21 %
Lymphs Abs: 3.1 K/uL (ref 0.7–4.0)
MCH: 31.1 pg (ref 26.0–34.0)
MCHC: 31.9 g/dL (ref 30.0–36.0)
MCV: 97.4 fL (ref 80.0–100.0)
Monocytes Absolute: 0.8 K/uL (ref 0.1–1.0)
Monocytes Relative: 6 %
Neutro Abs: 10.9 K/uL — ABNORMAL HIGH (ref 1.7–7.7)
Neutrophils Relative %: 72 %
Platelets: 229 K/uL (ref 150–400)
RBC: 3.41 MIL/uL — ABNORMAL LOW (ref 3.87–5.11)
RDW: 21.5 % — ABNORMAL HIGH (ref 11.5–15.5)
Smear Review: NORMAL
WBC: 18.4 K/uL — ABNORMAL HIGH (ref 4.0–10.5)
nRBC: 0 % (ref 0.0–0.2)

## 2023-12-06 LAB — COMPREHENSIVE METABOLIC PANEL WITH GFR
ALT: 13 U/L (ref 0–44)
AST: 38 U/L (ref 15–41)
Albumin: 2.6 g/dL — ABNORMAL LOW (ref 3.5–5.0)
Alkaline Phosphatase: 121 U/L (ref 38–126)
Anion gap: 14 (ref 5–15)
BUN: 27 mg/dL — ABNORMAL HIGH (ref 8–23)
CO2: 16 mmol/L — ABNORMAL LOW (ref 22–32)
Calcium: 8.1 mg/dL — ABNORMAL LOW (ref 8.9–10.3)
Chloride: 114 mmol/L — ABNORMAL HIGH (ref 98–111)
Creatinine, Ser: 0.95 mg/dL (ref 0.44–1.00)
GFR, Estimated: >60 mL/min (ref >60)
Glucose, Bld: 133 mg/dL — ABNORMAL HIGH (ref 70–99)
Potassium: 3.4 mmol/L — ABNORMAL LOW (ref 3.5–5.1)
Sodium: 143 mmol/L (ref 135–145)
Total Bilirubin: 0.8 mg/dL (ref 0.0–1.2)
Total Protein: 5.7 g/dL — ABNORMAL LOW (ref 6.5–8.1)

## 2023-12-06 LAB — TROPONIN T, HIGH SENSITIVITY
Troponin T High Sensitivity: 41 ng/L — ABNORMAL HIGH (ref 0–19)
Troponin T High Sensitivity: 41 ng/L — ABNORMAL HIGH (ref 0–19)

## 2023-12-06 LAB — LACTIC ACID, PLASMA
Lactic Acid, Venous: 3.5 mmol/L (ref 0.5–1.9)
Lactic Acid, Venous: 1.3 mmol/L (ref 0.5–1.9)

## 2023-12-06 LAB — RESP PANEL BY RT-PCR (RSV, FLU A&B, COVID)  RVPGX2
Influenza A by PCR: NEGATIVE
Influenza B by PCR: NEGATIVE
Resp Syncytial Virus by PCR: NEGATIVE
SARS Coronavirus 2 by RT PCR: NEGATIVE

## 2023-12-06 LAB — TSH: TSH: 5.05 u[IU]/mL — ABNORMAL HIGH (ref 0.350–4.500)

## 2023-12-06 LAB — PRO BRAIN NATRIURETIC PEPTIDE: Pro Brain Natriuretic Peptide: 4428 pg/mL — ABNORMAL HIGH (ref <300.0)

## 2023-12-06 LAB — CK: Total CK: 43 U/L (ref 38–234)

## 2023-12-06 LAB — MAGNESIUM: Magnesium: 1.8 mg/dL (ref 1.7–2.4)

## 2023-12-06 MED ORDER — LACTATED RINGERS IV BOLUS
1000.0000 mL | Freq: Once | INTRAVENOUS | Status: AC
  Administered 2023-12-06: 1000 mL via INTRAVENOUS

## 2023-12-06 MED ORDER — CHLORHEXIDINE GLUCONATE CLOTH 2 % EX PADS
6.0000 | MEDICATED_PAD | Freq: Every day | CUTANEOUS | Status: DC
  Administered 2023-12-06 – 2023-12-08 (×3): 6 via TOPICAL

## 2023-12-06 MED ORDER — SODIUM CHLORIDE 0.9 % IV SOLN
2.0000 g | Freq: Two times a day (BID) | INTRAVENOUS | Status: DC
  Administered 2023-12-07 – 2023-12-08 (×3): 2 g via INTRAVENOUS
  Filled 2023-12-06 (×4): qty 12.5

## 2023-12-06 MED ORDER — MIDODRINE HCL 5 MG PO TABS
10.0000 mg | ORAL_TABLET | Freq: Once | ORAL | Status: AC
  Administered 2023-12-06: 10 mg via ORAL
  Filled 2023-12-06: qty 2

## 2023-12-06 MED ORDER — SODIUM CHLORIDE 0.9 % IV SOLN
2.0000 g | Freq: Once | INTRAVENOUS | Status: AC
  Administered 2023-12-06: 2 g via INTRAVENOUS
  Filled 2023-12-06: qty 12.5

## 2023-12-06 MED ORDER — SODIUM CHLORIDE 0.9% FLUSH: 10.0000 mL | INTRAVENOUS | Status: DC | PRN

## 2023-12-06 MED ORDER — ORAL CARE MOUTH RINSE: 15.0000 mL | OROMUCOSAL | Status: DC | PRN

## 2023-12-06 MED ORDER — POTASSIUM CHLORIDE 10 MEQ/100ML IV SOLN
10.0000 meq | INTRAVENOUS | Status: AC
Start: 2023-12-06 — End: 2023-12-06
  Administered 2023-12-06 (×3): 10 meq via INTRAVENOUS
  Filled 2023-12-06 (×3): qty 100

## 2023-12-06 MED ORDER — POLYETHYLENE GLYCOL 3350 17 G PO PACK: 17.0000 g | PACK | Freq: Every day | ORAL | Status: DC | PRN

## 2023-12-06 MED ORDER — VANCOMYCIN HCL 1750 MG/350ML IV SOLN
1750.0000 mg | Freq: Once | INTRAVENOUS | Status: AC
  Administered 2023-12-06: 1750 mg via INTRAVENOUS
  Filled 2023-12-06: qty 350

## 2023-12-06 MED ORDER — ACETAMINOPHEN 325 MG PO TABS
650.0000 mg | ORAL_TABLET | Freq: Four times a day (QID) | ORAL | Status: DC | PRN
Start: 2023-12-06 — End: 2023-12-07

## 2023-12-06 MED ORDER — ACETAMINOPHEN 650 MG RE SUPP: 650.0000 mg | Freq: Four times a day (QID) | RECTAL | Status: DC | PRN

## 2023-12-06 MED ORDER — ONDANSETRON HCL 4 MG PO TABS: 4.0000 mg | ORAL_TABLET | Freq: Four times a day (QID) | ORAL | Status: DC | PRN

## 2023-12-06 MED ORDER — SIMETHICONE 80 MG PO CHEW: 80.0000 mg | CHEWABLE_TABLET | Freq: Four times a day (QID) | ORAL | Status: DC | PRN

## 2023-12-06 MED ORDER — FAMOTIDINE 20 MG PO TABS
20.0000 mg | ORAL_TABLET | Freq: Two times a day (BID) | ORAL | Status: DC
  Administered 2023-12-06 – 2023-12-08 (×4): 20 mg via ORAL
  Filled 2023-12-06 (×4): qty 1

## 2023-12-06 MED ORDER — VANCOMYCIN HCL IN DEXTROSE 1-5 GM/200ML-% IV SOLN
1000.0000 mg | Freq: Once | INTRAVENOUS | Status: DC
  Filled 2023-12-06: qty 200

## 2023-12-06 MED ORDER — DILTIAZEM HCL-DEXTROSE 125-5 MG/125ML-% IV SOLN (PREMIX)
5.0000 mg/h | INTRAVENOUS | Status: AC
  Administered 2023-12-06: 5 mg/h via INTRAVENOUS
  Administered 2023-12-07: 10 mg/h via INTRAVENOUS
  Filled 2023-12-06 (×2): qty 125

## 2023-12-06 MED ORDER — MIDODRINE HCL 5 MG PO TABS
15.0000 mg | ORAL_TABLET | Freq: Three times a day (TID) | ORAL | Status: DC
  Administered 2023-12-06 – 2023-12-08 (×7): 15 mg via ORAL
  Filled 2023-12-06 (×7): qty 3

## 2023-12-06 MED ORDER — METRONIDAZOLE 500 MG/100ML IV SOLN: 500.0000 mg | Freq: Two times a day (BID) | INTRAVENOUS | Status: DC

## 2023-12-06 MED ORDER — METRONIDAZOLE 500 MG/100ML IV SOLN
500.0000 mg | Freq: Two times a day (BID) | INTRAVENOUS | Status: DC
  Administered 2023-12-06 – 2023-12-08 (×4): 500 mg via INTRAVENOUS
  Filled 2023-12-06 (×4): qty 100

## 2023-12-06 MED ORDER — ALLOPURINOL 100 MG PO TABS
200.0000 mg | ORAL_TABLET | Freq: Every day | ORAL | Status: DC
  Administered 2023-12-06 – 2023-12-07 (×2): 200 mg via ORAL
  Filled 2023-12-06 (×3): qty 2

## 2023-12-06 MED ORDER — ATORVASTATIN CALCIUM 10 MG PO TABS
10.0000 mg | ORAL_TABLET | Freq: Every day | ORAL | Status: DC
  Administered 2023-12-06 – 2023-12-07 (×2): 10 mg via ORAL
  Filled 2023-12-06 (×2): qty 1

## 2023-12-06 MED ORDER — VANCOMYCIN HCL IN DEXTROSE 1-5 GM/200ML-% IV SOLN
1000.0000 mg | INTRAVENOUS | Status: DC
  Administered 2023-12-07: 1000 mg via INTRAVENOUS
  Filled 2023-12-06 (×2): qty 200

## 2023-12-06 MED ORDER — ONDANSETRON HCL 4 MG/2ML IJ SOLN
4.0000 mg | Freq: Four times a day (QID) | INTRAMUSCULAR | Status: DC | PRN
Start: 2023-12-06 — End: 2023-12-09

## 2023-12-06 MED ORDER — COLLAGENASE 250 UNIT/GM EX OINT
TOPICAL_OINTMENT | Freq: Every day | CUTANEOUS | Status: DC
  Filled 2023-12-06: qty 30

## 2023-12-06 MED ORDER — METRONIDAZOLE 500 MG/100ML IV SOLN
500.0000 mg | Freq: Once | INTRAVENOUS | Status: DC
  Filled 2023-12-06: qty 100

## 2023-12-06 MED ORDER — SODIUM CHLORIDE 0.9 % IV BOLUS: 1000.0000 mL | Freq: Once | INTRAVENOUS | Status: DC

## 2023-12-06 MED ORDER — SODIUM CHLORIDE 0.9% FLUSH
10.0000 mL | Freq: Two times a day (BID) | INTRAVENOUS | Status: DC
  Administered 2023-12-06 – 2023-12-08 (×4): 10 mL

## 2023-12-06 MED ORDER — DILTIAZEM HCL 25 MG/5ML IV SOLN
10.0000 mg | Freq: Once | INTRAVENOUS | Status: AC
  Administered 2023-12-06: 10 mg via INTRAVENOUS
  Filled 2023-12-06: qty 5

## 2023-12-06 MED ORDER — MORPHINE SULFATE (PF) 2 MG/ML IV SOLN: 2.0000 mg | INTRAVENOUS | Status: DC | PRN

## 2023-12-06 MED ORDER — FLUDROCORTISONE ACETATE 0.1 MG PO TABS
0.1000 mg | ORAL_TABLET | Freq: Every day | ORAL | Status: DC
  Administered 2023-12-06 – 2023-12-08 (×3): 0.1 mg via ORAL
  Filled 2023-12-06 (×3): qty 1

## 2023-12-06 NOTE — Consult Note (Signed)
 WOC Nurse Consult Note: patient seen by WOC Rn on last admission with moisture associated skin damage to R buttock  Reason for Consult: L buttock wound  Wound type: 1.  Healing moisture associated skin damage R buttock pink dry  2.  Unstageable Pressure Injury L buttock 60% tan necrotic 40% red moist  Pressure Injury POA: Yes Measurement: see nursing flowsheet  Wound bed: as above  Drainage (amount, consistency, odor) see nursing flowsheet  Periwound: intact  Dressing procedure/placement/frequency: Cleanse L buttock wound with Vashe wound cleanser Soila [phone]) do not rinse and allow to air dry.  Apply 1/4 thick layer of Santyl to wound bed daily, fill in wound with saline moistened gauze and cover with dry gauze and silicone foam or ABD pad and clothe tape whichever is preferred.  2.  Cover healing R buttocks wound with silicone foam, lift daily to assess. Change foam q3 days and prn soiling.   POC discussed with bedside nurse. WOC team will not follow. Re-consult if further needs arise.   Thank you,    Powell Bar MSN, RN-BC, [employer]

## 2023-12-06 NOTE — ED Triage Notes (Signed)
 coming from home. pt suppose to have cancer treatment today. niece found patient not responding sitting on the toilet. Fire and EMS stated patient wasn't really responding to them ether until right before coming to the hospital. EMS stated she only responded to pain but then they stood her up and she may have had a syncopal episode. Hypotension and tachycardia. Patient states she was sitting for 5 hours possible. CBG: 141. Hx Af-ib and takes blood thinners.    18G in RAC 114/60. Patient received .

## 2023-12-06 NOTE — Consult Note (Signed)
 CODE SEPSIS - PHARMACY COMMUNICATION  **Broad Spectrum Antibiotics should be administered within 1 hour of Sepsis diagnosis**  Time Code Sepsis Called/Page Received: 1245  Antibiotics Ordered: cefepime 2 G x1, vancomycin 1 G, Metronidazole 500 mg x1  Time of 1st antibiotic administration: 1316  Additional action taken by pharmacy: none  If necessary, Name of Provider/Nurse Contacted: n/a    Annabella LOISE Banks ,PharmD Clinical Pharmacist  12/06/2023  12:45 PM

## 2023-12-06 NOTE — ED Notes (Signed)
 Phlebotomist successful in gaining blood work.

## 2023-12-06 NOTE — ED Notes (Signed)
 At bedside with Primary RN for port placement

## 2023-12-06 NOTE — ED Notes (Signed)
 Called Lab for 2nd troponin and Lactic.

## 2023-12-06 NOTE — ED Notes (Signed)
 Fall bundle verified to be in place

## 2023-12-06 NOTE — Consult Note (Signed)
 Cardiology Consultation   Patient ID: Colleen [name] MRN: [id number]; DOB: 1945-04-24  Admit date: 12/06/2023 Date of Consult: 12/06/2023  PCP:  Leavy Mole, PA-C   [facility] Providers Cardiologist:  Redell Cave, MD        Patient Profile: Colleen [name] is a 78 y.o. female with a hx of recently diagnosed stage IV metastatic pancreatic cancer on chemotherapy, gout, orthostatic hypotension, COPD, hypertension, hyperlipidemia, paroxysmal atrial fibrillation on apixaban , who is being seen 12/06/2023 for the evaluation of atrial fibrillation with RVR at the request of Dr. Roann.  History of Present Illness:   Colleen [name] initially evaluated 03/2019 for abnormal EKG.  EKG from PCPs office demonstrated PVCs.  She was admitted in 03/2019 at [facility] for palpitations been found to have paroxysmal atrial fibrillation on telemetry was discharged on apixaban .  Echocardiogram 03/2019 showed an EF-60%, stress test 05/2019 was normal overall low risk study.  ZIO monitor 05/2019 was without evidence of atrial fibrillation.  Patient was seen in our office 08/2023 for follow-up on atrial fibrillation after recently being seen by oncology for stage IV pancreatic adenocarcinoma was referred for an outpatient Port-A-Cath placement and was noted to have tachycardia after the procedure.  She was sinus rhythm with frequent PACs.  She was found to be hypokalemic and started on oral potassium as well as metoprolol .  She had recently been taken off of all of her antihypertensive medications due to low blood pressure during hospitalization for decreased oral intake and volume depletion.  She had improvement with IV fluids and blood pressure medicines were stopped included hydrochlorothiazide  and enalapril .  Patient was admitted 09/2023 for mucositis and inability to eat or drink.  In the emergency department she was afebrile with a blood pressure of 86/53 and potassium 2.5.  Cardiology was consulted for sudden  onset tachycardia and she was noted to be in atrial fibrillation with RVR with ventricular rates in the 160s.  She was started on IV amiodarone  and it was felt that she would not tolerate nodal agents secondary to hypotension.  Blood pressure was stabilized with midodrine  and she was tolerating metoprolol  25 mg twice daily while in the hospital.  Echocardiogram revealed LVEF 55 to 60% with mildly elevated pulmonary artery systolic pressures and mild MR.  She was discharged on 10/07/23.   She was again hospitalized at [facility] 10/2 - 11/04/2023 with A-fib RVR.  She was found to be hypotensive by her home care nurse with blood pressure readings 70/40.  She did complain about feeling lightheaded.  She also qualified for SIRS without endorgan damage.  She completed 5 days of doxycycline .  Rate was controlled with IV amiodarone  she was switched to oral amiodarone  and was discharged on amiodarone .  With ongoing anemia Eliquis  was discontinued.  She was to have close cardiology follow-up after discharge.  She was again hospitalized 10/15 - 12/01/2023 for orthostatic hypotension.  She reported feeling generally unwell for approximately 1 week with poor appetite and decreased oral intake leading to progressive weakness.  While attempting to get to the kitchen she felt too weak to stand and lowered self to the ground without injury.  No significant trauma was reported.  She was found to be in atrial fibrillation with RVR heart rate improved.  Discussed SNF placement.  She was placed on midodrine  10 mg 3 times daily for continued elevated blood pressures.  She remained orthostatic and received fluids.  Midodrine  was increased to 50 mg 3 times daily on 10/28.  She  was discharged home with maximal home health PT/OT and instructions glucose follow-up with her PCP and oncology on 12/01/2023.    Patient presented to the [facility] emergency department earlier today with altered mental status and new syncope.  Patient stated that she was  supposed to go into for chemo and and was unable to get up off the toilet.  She states she was in withdrawal for approximately 5 hours prior to her niece coming to pick her up.  On arrival she was noted to be in atrial fibrillation with RVR.  She does not remember passing out but felt like she was going to pass out.  Patient stated when her niece arrived left she was still unable to get up so EMS was called and brought her to the hospital.  She was given 100 mL of normal saline.  She was noted to be in atrial fibrillation with a rate of 172.  She denied any chest pain but had some shortness of breath and some swelling to her bilateral lower extremities.  Initial vital signs reveal blood pressure 130/66, pulse of 115, respirations 18, temperature 97.5.  Pertinent labs reveal potassium 3.4, chloride 114, CO2 16, glucose 133, BUN 27, hemoglobin 10.6, high-sensitivity troponin 41.  EKG revealed atrial fibrillation rate 172 without any ST elevation or T wave inversions.  Chest x-ray was unrevealing.  She was started on a diltiazem drip, given cefepime 2 g IVP, 1 L of LR, given 10 mg of midodrine .  COVID was negative.  Cardiology was consulted to assist with management of atrial fibrillation with RVR.  Past Medical History:  Diagnosis Date   Asthma    Cataract    COPD (chronic obstructive pulmonary disease) (HCC)    GERD (gastroesophageal reflux disease)    Gout    History of pulmonary embolus (PE)    Hyperlipidemia    Hypertension    Osteoarthrosis    Prediabetes     Past Surgical History:  Procedure Laterality Date   BACK SURGERY     BREAST BIOPSY Left 04/08/2014   intraductal papilloma 5 mm   COLONOSCOPY WITH PROPOFOL  N/A 04/23/2020   Procedure: COLONOSCOPY WITH PROPOFOL ;  Surgeon: Janalyn Keene NOVAK, MD;  Location: [facility] ENDOSCOPY;  Service: Endoscopy;  Laterality: N/A;   ERCP N/A 08/05/2023   Procedure: ERCP, WITH INTERVENTION IF INDICATED;  Surgeon: Jinny Carmine, MD;  Location: [facility]  ENDOSCOPY;  Service: Endoscopy;  Laterality: N/A;   ESOPHAGOGASTRODUODENOSCOPY (EGD) WITH PROPOFOL  N/A 04/23/2020   Procedure: ESOPHAGOGASTRODUODENOSCOPY (EGD) WITH PROPOFOL ;  Surgeon: Janalyn Keene NOVAK, MD;  Location: [facility] ENDOSCOPY;  Service: Endoscopy;  Laterality: N/A;   GIVENS CAPSULE STUDY N/A 01/28/2022   Procedure: GIVENS CAPSULE STUDY;  Surgeon: Therisa Bi, MD;  Location: [facility] ENDOSCOPY;  Service: Gastroenterology;  Laterality: N/A;   IR IMAGING GUIDED PORT INSERTION  09/12/2023   IR INT EXT BILIARY DRAIN WITH CHOLANGIOGRAM  08/08/2023   IR RADIOLOGIST EVAL & MGMT  09/22/2023   IR REMOVAL BILIARY DRAIN  09/22/2023   LUMBAR LAMINECTOMY     TONSILLECTOMY       Home Medications:  Prior to Admission medications   Medication Sig Start Date End Date Taking? Authorizing Provider  acetaminophen  (TYLENOL ) 650 MG CR tablet Take 650 mg by mouth every 8 (eight) hours as needed for pain.   Yes [provider]  albuterol  (VENTOLIN  HFA) 108 (90 Base) MCG/ACT inhaler Inhale 2 puffs into the lungs every 4 (four) hours as needed for wheezing or shortness of breath. 03/31/23  Yes Tapia, Leisa, PA-C  allopurinol  (ZYLOPRIM ) 100 MG tablet Take 2 tablets (200 mg total) by mouth daily. 08/01/23  Yes Tapia, Leisa, PA-C  amiodarone  (PACERONE ) 200 MG tablet Take 1 tablet (200 mg total) by mouth daily. 11/04/23  Yes Wouk, Devaughn Sayres, MD  atorvastatin (LIPITOR) 10 MG tablet Take 1 tablet (10 mg total) by mouth daily. 11/18/23  Yes Josette Ade, MD  BREZTRI  AEROSPHERE 160-9-4.8 MCG/ACT AERO inhaler INHALE 2 PUFFS TWICE DAILY 08/19/23  Yes Tapia, Leisa, PA-C  cholecalciferol (VITAMIN D3) 25 MCG (1000 UNIT) tablet Take 1,000 Units by mouth daily.   Yes [provider]  docusate sodium (COLACE) 100 MG capsule Take 1 capsule (100 mg total) by mouth 2 (two) times daily. 11/01/23  Yes Patel, Sona, MD  Famotidine  (PEPCID  PO) Take 1 tablet by mouth at bedtime.   Yes [provider]   fludrocortisone (FLORINEF) 0.1 MG tablet Take 1 tablet (0.1 mg total) by mouth daily. 12/02/23  Yes Al-Sultani, Anmar, MD  lidocaine -prilocaine  (EMLA ) cream Apply to affected area once 09/07/23  Yes Finnegan, Evalene PARAS, MD  magic mouthwash (lidocaine , diphenhydrAMINE, alum & mag hydroxide) suspension Swish and spit 5 mLs 4 (four) times daily as needed for mouth pain. 10/01/23  Yes Babara Call, MD  magnesium  chloride (SLOW-MAG) 64 MG TBEC SR tablet Take 1 tablet (64 mg total) by mouth daily. 11/18/23  Yes Wieting, Richard, MD  megestrol  (MEGACE ) 20 MG tablet Take 1 tablet (20 mg total) by mouth 2 (two) times daily. 10/18/23  Yes Jacobo Evalene PARAS, MD  midodrine  (PROAMATINE ) 5 MG tablet Take 3 tablets (15 mg total) by mouth 3 (three) times daily with meals. 12/01/23  Yes Al-Sultani, Anmar, MD  Multiple Vitamins-Minerals (MULTIVITAMIN WOMEN 50+ PO) Take 1 tablet by mouth daily.   Yes [provider]  ondansetron  (ZOFRAN ) 8 MG tablet Take 1 tablet (8 mg total) by mouth every 8 (eight) hours as needed for nausea or vomiting. 09/07/23  Yes Jacobo Evalene PARAS, MD  polyethylene glycol (MIRALAX  / GLYCOLAX ) 17 g packet Take 17 g by mouth 2 (two) times daily as needed for mild constipation. 11/18/23  Yes Wieting, Richard, MD  potassium chloride  SA (KLOR-CON  M) 20 MEQ tablet Take 1 tablet (20 mEq total) by mouth daily. 11/04/23  Yes Wouk, Devaughn Sayres, MD  simethicone  (GAS-X) 80 MG chewable tablet Chew 1 tablet (80 mg total) by mouth 2 (two) times a day. 08/02/18 06/17/28 Yes PaduchowskiFranky, MD  Blood Pressure Monitoring (ADULT BLOOD PRESSURE CUFF LG) KIT 1 each by Does not apply route daily. 09/01/21   Bernardo Fend, DO  Elastic Bandages & Supports (ABDOMINAL BINDER/ELASTIC LARGE) MISC 1 Units by Does not apply route daily. 12/01/23   Al-Sultani, Anmar, MD    Scheduled Meds:  Continuous Infusions:  ceFEPime (MAXIPIME) IV 2 g (12/06/23 1316)   [START ON 12/07/2023] ceFEPime (MAXIPIME) IV     diltiazem  (CARDIZEM) infusion 10 mg/hr (12/06/23 1323)   metronidazole     metronidazole     [START ON 12/07/2023] vancomycin     vancomycin HCl     PRN Meds: acetaminophen  **OR** acetaminophen , morphine injection, ondansetron  **OR** ondansetron  (ZOFRAN ) IV, polyethylene glycol  Allergies:    Allergies  Allergen Reactions   Fentanyl  Swelling    It messes with her heart.    Nsaids     Other Reaction(s): Not available   Oxycodone  Swelling    Social History:   Social History   Socioeconomic History   Marital status: Widowed  Spouse name: Susann   Number of children: 0   Years of education: Not on file   Highest education level: 12th grade  Occupational History   Occupation: Retired  Tobacco Use   Smoking status: Former    Current packs/day: 0.00    Average packs/day: 1.5 packs/day for 20.0 years (30.0 ttl pk-yrs)    Types: Cigarettes    Start date: 11/13/1988    Quit date: 11/13/2008    Years since quitting: 15.0   Smokeless tobacco: Never   Tobacco comments:    smoking cessation materials not required  Vaping Use   Vaping status: Never Used  Substance and Sexual Activity   Alcohol use: Not Currently   Drug use: No   Sexual activity: Not Currently  Other Topics Concern   Not on file  Social History Narrative   Pt lives alone   Social Drivers of Health   Financial Resource Strain: Low Risk  (12/02/2022)   Overall Financial Resource Strain (CARDIA)    Difficulty of Paying Living Expenses: Not very hard  Food Insecurity: Food Insecurity Present (11/11/2023)   Hunger Vital Sign    Worried About Running Out of Food in the Last Year: Never true    Ran Out of Food in the Last Year: Sometimes true  Transportation Needs: No Transportation Needs (11/11/2023)   PRAPARE - Administrator, Civil Service (Medical): No    Lack of Transportation (Non-Medical): No  Recent Concern: Transportation Needs - Unmet Transportation Needs (10/01/2023)   PRAPARE - Therapist, Art (Medical): Yes    Lack of Transportation (Non-Medical): No  Physical Activity: Insufficiently Active (12/02/2022)   Exercise Vital Sign    Days of Exercise per Week: 2 days    Minutes of Exercise per Session: 60 min  Stress: No Stress Concern Present (12/02/2022)   [employer] of Occupational Health - Occupational Stress Questionnaire    Feeling of Stress : Not at all  Social Connections: Socially Isolated (11/11/2023)   Social Connection and Isolation Panel    Frequency of Communication with Friends and Family: More than three times a week    Frequency of Social Gatherings with Friends and Family: More than three times a week    Attends Religious Services: Never    Database Administrator or Organizations: Not on file    Attends Banker Meetings: Never    Marital Status: Widowed  Intimate Partner Violence: Not At Risk (11/11/2023)   Humiliation, Afraid, Rape, and Kick questionnaire    Fear of Current or Ex-Partner: No    Emotionally Abused: No    Physically Abused: No    Sexually Abused: No    Family History:    Family History  Problem Relation Age of Onset   Diabetes Mother    Lung cancer Father    Diabetes Sister    Congestive Heart Failure Sister    Glaucoma Sister    Breast cancer Maternal Aunt      ROS:  Please see the history of present illness.  Review of Systems  Constitutional:  Positive for malaise/fatigue.  Cardiovascular:  Positive for palpitations and leg swelling.  Neurological:  Positive for weakness.    All other ROS reviewed and negative.     Physical Exam/Data: Vitals:   12/06/23 1132 12/06/23 1150 12/06/23 1200 12/06/23 1230  BP: (!) 87/62 116/65 115/63 (!) 114/56  Pulse: (!) 106 (!) 104    Resp: (!) 22 19  Temp:      SpO2: 95% 96%    Weight:      Height:       No intake or output data in the 24 hours ending 12/06/23 1341    12/06/2023   10:21 AM 11/14/2023    8:35 AM 11/11/2023    5:04 PM   Last 3 Weights  Weight (lbs) 155 lb 13.8 oz 179 lb 7.3 oz --  Weight (kg) 70.7 kg 81.4 kg --     Body mass index is 26.75 kg/m.  General: Frail in no acute distress, chronically ill-appearing HEENT: normal Neck: no JVD Vascular: No carotid bruits; Distal pulses 2+ bilaterally Cardiac:  normal S1, S2; IR IR; I/VI systolic murmur  Lungs:  clear to auscultation bilaterally, no wheezing, rhonchi or rales, respirations are unlabored at rest is on oxygen via nasal cannula Abd: soft, nontender, no hepatomegaly  Ext: Trace pretibial edema Musculoskeletal:  No deformities, BUE and BLE strength normal and equal Skin: warm and dry  Neuro:  CNs 2-12 intact, no focal abnormalities noted Psych:  Normal affect   EKG:  The EKG was personally reviewed and demonstrates: Atrial fibrillation with RVR with rate 172 with large quantity of artifact potentially old inferior infarct Telemetry:  Telemetry was personally reviewed and demonstrates: Atrial fibrillation rates of the 110s to 130s  Relevant CV Studies:  Limited echo 10/29/2023 1. Limited study.   2. Left ventricular ejection fraction, by estimation, is 55 to 60%. The  left ventricle has normal function. Left ventricular diastolic function  could not be evaluated.   3. Right ventricular systolic function is normal. The right ventricular  size is normal.   4. Moderate pleural effusion. No pericardial effusion seen.   5. The inferior vena cava is normal in size with <50% respiratory  variability, suggesting right atrial pressure of 8 mmHg.   2d echo 10/03/2023  1. Left ventricular ejection fraction, by estimation, is 55 to 60%. The  left ventricle has normal function. The left ventricle has no regional  wall motion abnormalities. Left ventricular diastolic parameters are  indeterminate.   2. Right ventricular systolic function is normal. The right ventricular  size is normal. There is mildly elevated pulmonary artery systolic  pressure.   3.  Left atrial size was mildly dilated.   4. Right atrial size was mildly dilated.   5. The mitral valve is normal in structure. Mild mitral valve  regurgitation. No evidence of mitral stenosis.   6. Tricuspid valve regurgitation is moderate.   7. The aortic valve is normal in structure. Aortic valve regurgitation is  not visualized. No aortic stenosis is present.   8. The inferior vena cava is dilated in size with >50% respiratory  variability, suggesting right atrial pressure of 8 mmHg.   Laboratory Data: High Sensitivity Troponin:   Recent Labs  Lab 11/09/23 1617 11/09/23 1826 11/10/23 0117 11/10/23 0657  TROPONINIHS 105* 121* 136* 134*     Chemistry Recent Labs  Lab 12/06/23 1027 12/06/23 1117  NA 143  --   K 3.4*  --   CL 114*  --   CO2 16*  --   GLUCOSE 133*  --   BUN 27*  --   CREATININE 0.95  --   CALCIUM  8.1*  --   MG  --  1.8  GFRNONAA >60  --   ANIONGAP 14  --     Recent Labs  Lab 12/06/23 1027  PROT 5.7*  ALBUMIN 2.6*  AST 38  ALT 13  ALKPHOS 121  BILITOT 0.8   Lipids No results for input(s): CHOL, TRIG, HDL, LABVLDL, LDLCALC, CHOLHDL in the last 168 hours.  Hematology Recent Labs  Lab 12/06/23 1027  WBC 18.4*  RBC 3.41*  HGB 10.6*  HCT 33.2*  MCV 97.4  MCH 31.1  MCHC 31.9  RDW 21.5*  PLT 229   Thyroid  No results for input(s): TSH, FREET4 in the last 168 hours.  BNP Recent Labs  Lab 12/06/23 1117  PROBNP 4,428.0*    DDimer No results for input(s): DDIMER in the last 168 hours.  Radiology/Studies:  DG Chest Portable 1 View Result Date: 12/06/2023 EXAM: 1 VIEW(S) XRAY OF THE CHEST 12/06/2023 11:27:36 AM COMPARISON: 11/09/2023 CLINICAL HISTORY: weakness FINDINGS: LINES, TUBES AND DEVICES: Right internal jugular port-a-cath is unchanged. LUNGS AND PLEURA: No focal pulmonary opacity. No pulmonary edema. No pleural effusion. No pneumothorax. HEART AND MEDIASTINUM: No acute abnormality of the cardiac and mediastinal  silhouettes. BONES AND SOFT TISSUES: No acute osseous abnormality. IMPRESSION: 1. No acute cardiopulmonary process. Electronically signed by: Lynwood Seip MD 12/06/2023 11:37 AM EST RP Workstation: [id number]     Assessment and Plan: Atrial fibrillation with RVR -Known history of atrial fibrillation -Presented with weakness and poor oral intake -Initial EKG showed A-fib with rates of 172 -Started on diltiazem drip with slow rate control being achieved -Patient was on oral amiodarone  at home, restart home medications -TSH added to labs with being on amiodarone  as an outpatient - Not currently on anticoagulation due to chronic anemia and recent hospitalization with hemoglobin less than 7 requiring blood transfusions -Continue to treat underlying drivers of her RVR such as possible infection - Continue with telemetry monitoring  Hypokalemia -Serum potassium 3.4 -Receiving supplementation -Monitor/trend/replete electrolytes as needed -Daily BMP  Hypotension -Blood pressure 100/62 -Given IV fluids and midodrine  -Longstanding history of chronic hypotension -Continued on midodrine  15 mg 3 times daily -Continued on Florinef at 0.1 mg daily -Vital signs per unit protocol  Hyperlipidemia -Continue on atorvastatin  Chronic anemia -Hemoglobin 10.6 -No signs of active bleeding -Daily CBC  Stage IV metastatic pancreatic cancer -Follows with oncology as an outpatient with weekly chemo -Recommend palliative care consult for discussion of goals of treatment   Risk Assessment/Risk Scores:         CHA2DS2-VASc Score = 5   This indicates a 7.2% annual risk of stroke. The patient's score is based upon: CHF History: 0 HTN History: 1 Diabetes History: 1 Stroke History: 0 Vascular Disease History: 0 Age Score: 2 Gender Score: 1        For questions or updates, please contact [facility] Please consult [email or web address] for contact info under      Signed, Loraine Bhullar, NP  12/06/2023 1:41 PM

## 2023-12-06 NOTE — ED Notes (Signed)
Patient denies the need to urinate at this time. 

## 2023-12-06 NOTE — Progress Notes (Signed)
 Pharmacy Antibiotic Note  Colleen [name] is a 78 y.o. female with a PMH of COPD, A.fib in RVR, and pancreatic cancer admitted on 12/06/2023 with sepsis 2/2 potential intra-abdominal infection.  Pharmacy has been consulted for Vancomycin and cefepime dosing.  Plan: Height: 5' 4 (162.6 cm) Weight: 70.7 kg (155 lb 13.8 oz) IBW/kg (Calculated) : 54.7  Plan:  Cefepime 2G q12h  Vancomycin 1750 mg LD scheduled to given at 1330 on 11/11 Vancomycin 1000 mg IV Q 24 hrs ordered to start at 1400 on 11/12 . Goal AUC 400-550. Obtain levels at steady state or as clinically indicated.   Expected AUC: 491.8 SCr used: 0.95  Weight used for dosing: IBW Cmin: 12.7 Will monitor serum creatinine daily while on vanc. Follow renal function and cultures for adjustments   Temp (24hrs), Avg:97.5 F (36.4 C), Min:97.5 F (36.4 C), Max:97.5 F (36.4 C)  Recent Labs  Lab 12/06/23 1027 12/06/23 1117  WBC 18.4*  --   CREATININE 0.95  --   LATICACIDVEN  --  3.5*    Estimated Creatinine Clearance: 47.8 mL/min (by C-G formula based on SCr of 0.95 mg/dL).    Allergies  Allergen Reactions   Fentanyl  Swelling    It messes with her heart.    Nsaids     Other Reaction(s): Not available   Oxycodone  Swelling    Antimicrobials this admission: 11/11 Vancomycin >>  11/11 Cefepime >>  11/11 Metronidazole   Microbiology results: 11/11 BCx: pending  Thank you for allowing pharmacy to be a part of this patient's care.  Colleen [name], PharmD Clinical Pharmacist 12/06/2023 1:38 PM

## 2023-12-06 NOTE — ED Provider Notes (Signed)
 [facility] Provider Note    Event Date/Time   First MD Initiated Contact with Patient 12/06/23 1041     (approximate)   History   Altered Mental Status and Near Syncope   HPI  Colleen [name] is a 78 y.o. female  with COPD. Pancreatitc cancer on chemo who comes with syncope episode. Pt was on sitting on toilet for 5 hours before her niece came in. Pt noted to be in Afib with RVR. Last ate at 5pm yesterday. Does not remember passing out but felt like she was going to pass out.   I reviewed a discharge note from 12/01/2022 I have where patient is not on anticoagulation.  She was attempting to get out of the kitchen when she was too weak to stand.  She was placed on midodrine  15 mg 3 times daily.  She was also found to be in A-fib with RVR at this time.  They will try to get her to SNF placement but ended up being discharged home.  They had also tried Florinef to help with orthostatic hypotension as well.  When niece got there she was sitting on the toilet but not responding.  When they tried to stand her up they felt like she could have had a syncopal episode no to be hypotensive and tachycardic.  Patient was given 100 mL of normal saline.  Atrial fibrillation with a rate of 172 without any ST elevation or T wave inversions, normal intervals.  She reports that she has not taken her midodrine  yet today.  Patient denies any falls and hit her head.  She denies any chest pain, shortness of breath, abdominal pain.  Denies any urinary symptoms.     Physical Exam   Triage Vital Signs: ED Triage Vitals  Encounter Vitals Group     BP 12/06/23 1027 113/66     Girls Systolic BP Percentile --      Girls Diastolic BP Percentile --      Boys Systolic BP Percentile --      Boys Diastolic BP Percentile --      Pulse Rate 12/06/23 1027 (!) 150     Resp 12/06/23 1027 18     Temp 12/06/23 1027 (!) 97.5 F (36.4 C)     Temp src --      SpO2 12/06/23 1027 96 %     Weight  12/06/23 1021 155 lb 13.8 oz (70.7 kg)     Height 12/06/23 1021 5' 4 (1.626 m)     Head Circumference --      Peak Flow --      Pain Score 12/06/23 1019 0     Pain Loc --      Pain Education --      Exclude from Growth Chart --     Most recent vital signs: Vitals:   12/06/23 1027  BP: 113/66  Pulse: (!) 150  Resp: 18  Temp: (!) 97.5 F (36.4 C)  SpO2: 96%     General: Awake, no distress.  CV:  Good peripheral perfusion.  Clear lungs Resp:  Normal effort.  Abd:  No distention.  Soft and nontender Other:  Port noted in right chest wall.  No evidence of hematoma to the head.  No chest wall tenderness no abdominal tenderness.  A little bit of swelling noted slightly worse on the left leg   ED Results / Procedures / Treatments   Labs (all labs ordered are listed, but only abnormal  results are displayed) Labs Reviewed  COMPREHENSIVE METABOLIC PANEL WITH GFR - Abnormal; Notable for the following components:      Result Value   Potassium 3.4 (*)    Chloride 114 (*)    CO2 16 (*)    Glucose, Bld 133 (*)    BUN 27 (*)    Calcium  8.1 (*)    Total Protein 5.7 (*)    Albumin 2.6 (*)    All other components within normal limits  CBC WITH DIFFERENTIAL/PLATELET - Abnormal; Notable for the following components:   WBC 18.4 (*)    RBC 3.41 (*)    Hemoglobin 10.6 (*)    HCT 33.2 (*)    RDW 21.5 (*)    Neutro Abs 10.9 (*)    Abs Immature Granulocytes 0.17 (*)    All other components within normal limits  TROPONIN T, HIGH SENSITIVITY - Abnormal; Notable for the following components:   Troponin T High Sensitivity 41 (*)    All other components within normal limits  CULTURE, BLOOD (ROUTINE X 2)  CULTURE, BLOOD (ROUTINE X 2)  RESP PANEL BY RT-PCR (RSV, FLU A&B, COVID)  RVPGX2  URINALYSIS, W/ REFLEX TO CULTURE (INFECTION SUSPECTED)  LACTIC ACID, PLASMA  LACTIC ACID, PLASMA  MAGNESIUM   PRO BRAIN NATRIURETIC PEPTIDE  CK  TYPE AND SCREEN     EKG  My interpretation of  EKG:  Atrial fibrillation with a rate of 172 without any ST elevation or T wave inversions, normal intervals  RADIOLOGY I have reviewed the xray personally and interpreted no evidence focal pneumonia   PROCEDURES:  Critical Care performed: No  .1-3 Lead EKG Interpretation  Performed by: Ernest Ronal BRAVO, MD Authorized by: Ernest Ronal BRAVO, MD     Interpretation: abnormal     ECG rate:  115   Rhythm: atrial fibrillation     Ectopy: none     Conduction: normal   .Critical Care  Performed by: Ernest Ronal BRAVO, MD Authorized by: Ernest Ronal BRAVO, MD   Critical care provider statement:    Critical care time (minutes):  30   Critical care was necessary to treat or prevent imminent or life-threatening deterioration of the following conditions:  Sepsis   Critical care was time spent personally by me on the following activities:  Development of treatment plan with patient or surrogate, discussions with consultants, evaluation of patient's response to treatment, examination of patient, ordering and review of laboratory studies, ordering and review of radiographic studies, ordering and performing treatments and interventions, pulse oximetry, re-evaluation of patient's condition and review of old charts    MEDICATIONS ORDERED IN ED: Medications  diltiazem (CARDIZEM) 125 mg in dextrose  5% 125 mL (1 mg/mL) infusion (7.5 mg/hr Intravenous Rate/Dose Change 12/06/23 1245)  ceFEPIme (MAXIPIME) 2 g in sodium chloride  0.9 % 100 mL IVPB (has no administration in time range)  metroNIDAZOLE (FLAGYL) IVPB 500 mg (has no administration in time range)  vancomycin (VANCOCIN) IVPB 1000 mg/200 mL premix (has no administration in time range)  lactated ringers  bolus 1,000 mL (0 mLs Intravenous Paused 12/06/23 1217)  diltiazem (CARDIZEM) injection 10 mg (10 mg Intravenous Given 12/06/23 1126)  midodrine  (PROAMATINE ) tablet 10 mg (10 mg Oral Given 12/06/23 1128)     IMPRESSION / MDM / ASSESSMENT AND PLAN / ED COURSE   I reviewed the triage vital signs and the nursing notes.   Patient's presentation is most consistent with acute presentation with potential threat to life or bodily function.   Patient  comes in with syncopal episode could be vasovagal versus from A-fib with RVR versus dehydration.  She did not fall or hit her head no evidence of trauma on her head and she is responding to all questions appropriately.  She denies any chest pain or shortness of breath to suggest PE although she does have a history of DVT and is currently off Eliquis  but she is not hypoxic.  Will consider DVT ultrasounds to evaluate for DVT given she does have a little bit of swelling noted in her legs.  Her abdomen is soft and nontender low suspicion for acute abdominal process.  Patient was in A-fib with rates into the 130s therefore I did give the patient a dose of diltiazem.  After 10 mg of diltiazem patient's blood pressures did drop but she is getting a liter of fluid and have ordered her her home midodrine  that she had missed.  Do not want to give additional fluid bolus but will start her on diltiazem infusion as her heart rates are still in the 120s.  We would hold on giving amiodarone  as she is not anticoagulated and if we cardiovert her chemically then there is risk to stroke given off Eliquis .  Patient's labs do show some dehydration.  White count is elevated therefore meets sepsis criteria unclear source broad-spectrum antibiotics were started.  Troponin slightly elevated more likely just demand.  No evidence of anemia and is actually improving from her prior.  COVID test was negative.  Will put in straight cath for urinalysis.  The patient's heart rate still is are in the 120s on the Dilt drip but blood pressures are improved.  I will discuss the hospital team for admission due to concerns for A-fib with RVR.  Her repeat abdominal exam remains soft and nontender and she continues deny any headaches or confusion.    The patient  is on the cardiac monitor to evaluate for evidence of arrhythmia and/or significant heart rate changes.      FINAL CLINICAL IMPRESSION(S) / ED DIAGNOSES   Final diagnoses:  Syncope, unspecified syncope type  Atrial fibrillation with RVR (HCC)  Sepsis, due to unspecified organism, unspecified whether acute organ dysfunction present [facility])     Rx / DC Orders   ED Discharge Orders     None        Note:  This document was prepared using Dragon voice recognition software and may include unintentional dictation errors.   Ernest Ronal BRAVO, MD 12/06/23 [phone]

## 2023-12-06 NOTE — H&P (Addendum)
 History and Physical    Colleen [name] FMW:[id number] DOB: [birth year] DOA: 12/06/2023  DOS: the patient was seen and examined on 12/06/2023  PCP: Leavy Mole, PA-C   Patient coming from: Home  I have personally briefly reviewed patient's old medical records in [facility]  Chief Complaint: Altered mental status and near syncope at home  HPI: Colleen [name] is a pleasant 78 y.o. female with medical history significant for stage IV pancreatic cancer on chemo, COPD/asthma, HTN, HLD, prediabetes, gout, GERD, CKD 3, anemia, A-fib and PE not on  anticoagulation due to history of GI bleeding, lumbar spinal stenosis who presented today 12/06/2023 at [facility] ED with an episode of syncope at home.  Patient was on toilet for 5 hours before her niece came in.  Patient noted to be in A-fib with RVR in the ED.  Patient last ate at 5 PM yesterday.  Does not remember passing out but felt like she was going to pass out.  She was recently discharged from 12/01/2023 after prolonged hospitalization from 12/10/2023 for generalized weakness and possible syncopal episode.  She was hospitalized even before between 10/2 to 10/10 for seizure and worsening anemia with a hemoglobin of 6.8, transfuse 1 unit PRBC then and Eliquis  was discontinued due to possibility of GI bleeding. When patient's niece got her home patient was sitting on the toilet but not responding.  When they tried to stand her up they felt like she could have had a syncopal episode, noted to be hypotensive and tachycardic.  Patient was given 100 mL of normal saline.  Patient was found to be in A-fib with RVR with a rate of 172.  Patient reports that she did not take midodrine  today.  Patient denies any falls or hitting her head.  She denies any chest pain shortness of breath, palpitations, abdominal pain.  Denies any fever, chills, urinary symptoms.  ED Course: Upon arrival to the ED, patient is found to be tachycardic in 150s in the ED, blood pressure  was 113/66, afebrile, noted to have leukocytosis at 18.4 BUN of 27, creatinine of 0.95, lactic acid was 3.5.  Patient was started on amiodarone  drip, broad-spectrum antibiotic including vancomycin  cefepime  and Flagyl .  Hospitalist service was consulted for evaluation for admission for A-fib with RVR and possible sepsis in the setting of immunocompromised host with metastatic pancreatic cancer on chemotherapy.  Review of Systems:  ROS  All other systems negative except as noted in the HPI.  Past Medical History:  Diagnosis Date   Asthma    Cataract    COPD (chronic obstructive pulmonary disease) (HCC)    GERD (gastroesophageal reflux disease)    Gout    History of pulmonary embolus (PE)    Hyperlipidemia    Hypertension    Osteoarthrosis    Prediabetes     Past Surgical History:  Procedure Laterality Date   BACK SURGERY     BREAST BIOPSY Left 04/08/2014   intraductal papilloma 5 mm   COLONOSCOPY WITH PROPOFOL  N/A 04/23/2020   Procedure: COLONOSCOPY WITH PROPOFOL ;  Surgeon: Janalyn Keene NOVAK, MD;  Location: [facility] ENDOSCOPY;  Service: Endoscopy;  Laterality: N/A;   ERCP N/A 08/05/2023   Procedure: ERCP, WITH INTERVENTION IF INDICATED;  Surgeon: Jinny Carmine, MD;  Location: [facility] ENDOSCOPY;  Service: Endoscopy;  Laterality: N/A;   ESOPHAGOGASTRODUODENOSCOPY (EGD) WITH PROPOFOL  N/A 04/23/2020   Procedure: ESOPHAGOGASTRODUODENOSCOPY (EGD) WITH PROPOFOL ;  Surgeon: Janalyn Keene NOVAK, MD;  Location: [facility] ENDOSCOPY;  Service: Endoscopy;  Laterality: N/A;  GIVENS CAPSULE STUDY N/A 01/28/2022   Procedure: GIVENS CAPSULE STUDY;  Surgeon: Therisa Bi, MD;  Location: [facility] ENDOSCOPY;  Service: Gastroenterology;  Laterality: N/A;   IR IMAGING GUIDED PORT INSERTION  09/12/2023   IR INT EXT BILIARY DRAIN WITH CHOLANGIOGRAM  08/08/2023   IR RADIOLOGIST EVAL & MGMT  09/22/2023   IR REMOVAL BILIARY DRAIN  09/22/2023   LUMBAR LAMINECTOMY     TONSILLECTOMY       reports that she quit smoking about 15  years ago. Her smoking use included cigarettes. She started smoking about 35 years ago. She has a 30 pack-year smoking history. She has never used smokeless tobacco. She reports that she does not currently use alcohol . She reports that she does not use drugs.  Allergies  Allergen Reactions   Fentanyl  Swelling    It messes with her heart.    Nsaids     Other Reaction(s): Not available   Oxycodone  Swelling    Family History  Problem Relation Age of Onset   Diabetes Mother    Lung cancer Father    Diabetes Sister    Congestive Heart Failure Sister    Glaucoma Sister    Breast cancer Maternal Aunt     Prior to Admission medications   Medication Sig Start Date End Date Taking? Authorizing Provider  acetaminophen  (TYLENOL ) 650 MG CR tablet Take 650 mg by mouth every 8 (eight) hours as needed for pain.    [provider]  albuterol  (VENTOLIN  HFA) 108 (90 Base) MCG/ACT inhaler Inhale 2 puffs into the lungs every 4 (four) hours as needed for wheezing or shortness of breath. 03/31/23   Tapia, Leisa, PA-C  allopurinol  (ZYLOPRIM ) 100 MG tablet Take 2 tablets (200 mg total) by mouth daily. 08/01/23   Tapia, Leisa, PA-C  amiodarone  (PACERONE ) 200 MG tablet Take 1 tablet (200 mg total) by mouth daily. 11/04/23   Wouk, Devaughn Sayres, MD  atorvastatin  (LIPITOR) 10 MG tablet Take 1 tablet (10 mg total) by mouth daily. 11/18/23   Josette Ade, MD  Blood Pressure Monitoring (ADULT BLOOD PRESSURE CUFF LG) KIT 1 each by Does not apply route daily. 09/01/21   Bernardo Fend, DO  BREZTRI  AEROSPHERE 160-9-4.8 MCG/ACT AERO inhaler INHALE 2 PUFFS TWICE DAILY 08/19/23   Tapia, Leisa, PA-C  cholecalciferol  (VITAMIN D3) 25 MCG (1000 UNIT) tablet Take 1,000 Units by mouth daily.    [provider]  docusate sodium  (COLACE) 100 MG capsule Take 1 capsule (100 mg total) by mouth 2 (two) times daily. 11/01/23   Patel, Sona, MD  Elastic Bandages & Supports (ABDOMINAL BINDER/ELASTIC LARGE) MISC 1 Units  by Does not apply route daily. 12/01/23   Al-Sultani, Anmar, MD  Famotidine  (PEPCID  PO) Take 1 tablet by mouth at bedtime.    [provider]  fludrocortisone  (FLORINEF ) 0.1 MG tablet Take 1 tablet (0.1 mg total) by mouth daily. 12/02/23   Al-Sultani, Anmar, MD  lidocaine -prilocaine  (EMLA ) cream Apply to affected area once 09/07/23   Jacobo Evalene PARAS, MD  magic mouthwash (lidocaine , diphenhydrAMINE, alum & mag hydroxide) suspension Swish and spit 5 mLs 4 (four) times daily as needed for mouth pain. 10/01/23   Babara Call, MD  magnesium  chloride (SLOW-MAG) 64 MG TBEC SR tablet Take 1 tablet (64 mg total) by mouth daily. 11/18/23   Josette Ade, MD  megestrol  (MEGACE ) 20 MG tablet Take 1 tablet (20 mg total) by mouth 2 (two) times daily. 10/18/23   Jacobo Evalene PARAS, MD  midodrine  (PROAMATINE ) 5 MG  tablet Take 3 tablets (15 mg total) by mouth 3 (three) times daily with meals. 12/01/23   Al-Sultani, Anmar, MD  Multiple Vitamins-Minerals (MULTIVITAMIN WOMEN 50+ PO) Take 1 tablet by mouth daily.    [provider]  ondansetron  (ZOFRAN ) 8 MG tablet Take 1 tablet (8 mg total) by mouth every 8 (eight) hours as needed for nausea or vomiting. 09/07/23   Jacobo Evalene PARAS, MD  polyethylene glycol (MIRALAX  / GLYCOLAX ) 17 g packet Take 17 g by mouth 2 (two) times daily as needed for mild constipation. 11/18/23   Josette Ade, MD  potassium chloride  SA (KLOR-CON  M) 20 MEQ tablet Take 1 tablet (20 mEq total) by mouth daily. 11/04/23   Wouk, Devaughn Sayres, MD  simethicone  (GAS-X) 80 MG chewable tablet Chew 1 tablet (80 mg total) by mouth 2 (two) times a day. 08/02/18 06/17/28  Dorothyann Drivers, MD    Physical Exam: Vitals:   12/06/23 1230 12/06/23 1400 12/06/23 1510 12/06/23 1523  BP: (!) 114/56 (!) 116/92 100/62   Pulse:  (!) 104 (!) 107   Resp:  20 15   Temp:    97.6 F (36.4 C)  TempSrc:    Oral  SpO2:  96% 95%   Weight:      Height:        Physical Exam   Constitutional: Alert,  awake, calm, comfortable HEENT: Neck supple Respiratory: Clear to auscultation B/L, no wheezing, no rales.  Cardiovascular: Regular rate and rhythm, no murmurs / rubs / gallops. No extremity edema. 2+ pedal pulses. No carotid bruits.  Abdomen: Soft, no tenderness, Bowel sounds positive.  Musculoskeletal: no clubbing / cyanosis. Good ROM, no contractures. Normal muscle tone.  Skin: She has left buttock chronic healing ulcer unstageable Neurologic: CN 2-12 grossly intact. Sensation intact, No focal deficit identified Psychiatric: Alert and oriented x 3. Normal mood.    Labs on Admission: I have personally reviewed following labs and imaging studies  CBC: Recent Labs  Lab 12/06/23 1027  WBC 18.4*  NEUTROABS 10.9*  HGB 10.6*  HCT 33.2*  MCV 97.4  PLT 229   Basic Metabolic Panel: Recent Labs  Lab 12/06/23 1027 12/06/23 1117  NA 143  --   K 3.4*  --   CL 114*  --   CO2 16*  --   GLUCOSE 133*  --   BUN 27*  --   CREATININE 0.95  --   CALCIUM  8.1*  --   MG  --  1.8   GFR: Estimated Creatinine Clearance: 47.8 mL/min (by C-G formula based on SCr of 0.95 mg/dL). Liver Function Tests: Recent Labs  Lab 12/06/23 1027  AST 38  ALT 13  ALKPHOS 121  BILITOT 0.8  PROT 5.7*  ALBUMIN  2.6*   No results for input(s): LIPASE, AMYLASE in the last 168 hours. No results for input(s): AMMONIA in the last 168 hours. Coagulation Profile: No results for input(s): INR, PROTIME in the last 168 hours. Cardiac Enzymes: Recent Labs  Lab 12/06/23 1117  CKTOTAL 43   BNP (last 3 results) No results for input(s): BNP in the last 8760 hours. HbA1C: No results for input(s): HGBA1C in the last 72 hours. CBG: No results for input(s): GLUCAP in the last 168 hours. Lipid Profile: No results for input(s): CHOL, HDL, LDLCALC, TRIG, CHOLHDL, LDLDIRECT in the last 72 hours. Thyroid  Function Tests: No results for input(s): TSH, T4TOTAL, FREET4, T3FREE,  THYROIDAB in the last 72 hours. Anemia Panel: No results for input(s): VITAMINB12, FOLATE, FERRITIN, TIBC,  IRON, RETICCTPCT in the last 72 hours. Urine analysis:    Component Value Date/Time   COLORURINE STRAW (A) 11/09/2023 1650   APPEARANCEUR CLEAR (A) 11/09/2023 1650   LABSPEC 1.005 11/09/2023 1650   PHURINE 6.0 11/09/2023 1650   GLUCOSEU NEGATIVE 11/09/2023 1650   HGBUR LARGE (A) 11/09/2023 1650   BILIRUBINUR NEGATIVE 11/09/2023 1650   BILIRUBINUR negative 07/26/2022 1046   KETONESUR NEGATIVE 11/09/2023 1650   PROTEINUR NEGATIVE 11/09/2023 1650   UROBILINOGEN 0.2 07/26/2022 1046   NITRITE NEGATIVE 11/09/2023 1650   LEUKOCYTESUR NEGATIVE 11/09/2023 1650    Radiological Exams on Admission: I have personally reviewed images US  Venous Img Lower Bilateral Result Date: 12/06/2023 CLINICAL DATA:  Left lower extremity edema. EXAM: LEFT LOWER EXTREMITY VENOUS DOPPLER ULTRASOUND TECHNIQUE: Gray-scale sonography with graded compression, as well as color Doppler and duplex ultrasound were performed to evaluate the lower extremity deep venous systems from the level of the common femoral vein and including the common femoral, femoral, profunda femoral, popliteal and calf veins including the posterior tibial, peroneal and gastrocnemius veins when visible. The superficial great saphenous vein was also interrogated. Spectral Doppler was utilized to evaluate flow at rest and with distal augmentation maneuvers in the common femoral, femoral and popliteal veins. COMPARISON:  None Available. FINDINGS: Contralateral Common Femoral Vein: Respiratory phasicity is normal and symmetric with the symptomatic side. No evidence of thrombus. Normal compressibility. Common Femoral Vein: No evidence of thrombus. Normal compressibility, respiratory phasicity and response to augmentation. Saphenofemoral Junction: No evidence of thrombus. Normal compressibility and flow on color Doppler imaging. Profunda  Femoral Vein: Nonocclusive thrombus in the visualized profunda femoral vein. Femoral Vein: Nonocclusive thrombus in the visualized femoral vein. Popliteal Vein: Occlusive thrombus in the visualized popliteal vein. Calf Veins: Thrombus in the visualized posterior tibial and peroneal veins. Superficial Great Saphenous Vein: No evidence of thrombus. Normal compressibility. Venous Reflux:  None. Other Findings: No evidence of superficial thrombophlebitis or abnormal fluid collection. IMPRESSION: Positive for deep vein thrombosis in the left lower extremity involving the profunda femoral vein, femoral vein, popliteal vein and calf veins. Thrombus is nonocclusive in the profunda femoral and femoral veins and occlusive in the popliteal vein. Electronically Signed   By: Marcey Moan M.D.   On: 12/06/2023 14:38   DG Chest Portable 1 View Result Date: 12/06/2023 EXAM: 1 VIEW(S) XRAY OF THE CHEST 12/06/2023 11:27:36 AM COMPARISON: 11/09/2023 CLINICAL HISTORY: weakness FINDINGS: LINES, TUBES AND DEVICES: Right internal jugular port-a-cath is unchanged. LUNGS AND PLEURA: No focal pulmonary opacity. No pulmonary edema. No pleural effusion. No pneumothorax. HEART AND MEDIASTINUM: No acute abnormality of the cardiac and mediastinal silhouettes. BONES AND SOFT TISSUES: No acute osseous abnormality. IMPRESSION: 1. No acute cardiopulmonary process. Electronically signed by: Lynwood Seip MD 12/06/2023 11:37 AM EST RP Workstation: [id number]    EKG: My personal interpretation of EKG shows: A-fib with RVR    Assessment/Plan Principal Problem:   Atrial fibrillation with RVR (HCC) Active Problems:   Orthostatic hypotension   Primary pancreatic cancer with metastasis to other site [facility])   Anemia of chronic disease   GERD (gastroesophageal reflux disease)   Sepsis (HCC)    Assessment and Plan: This is a 78 year old female with multiple medical problems including but not limited to stage IV pancreatic cancer on active  chemotherapy, atrial fibrillation, chronic anemia, GERD, history of orthostatic hypotension and syncope brought into ED at [facility] due to possibly syncopal episode at home and found to have A-fib with RVR requiring amiodarone  drip.  1.  A-fib with RVR - She will be admitted to hospital as inpatient - She was started on amiodarone  drip in the emergency room which will be continued. - Continue monitoring in telemetry - Cardiology has been consulted and will follow recommendation  2.  Sepsis/leukocytosis/hypotension/tachycardia - Source is unknown - She was started on broad-spectrum antibiotic with cefepime , vancomycin , Flagyl . - Patient had SIRS present on admission with tachycardia, leukocytosis, lactic acidosis present on admission - I will continue those medications for now. - Will continue to monitor her symptoms. - Continue to follow cultures.  3.  Possible syncopal episode/hypotension/history of orthostatic hypotension - This may be combination due to hypotension, syncope, A-fib - Continue to monitor her symptoms telemetry - Will check orthostatic vitals - Continue her home fludrocortisone  and midodrine   4.  Stage IV pancreatic cancer on active chemo - Follow-up with oncologist as outpatient  5.  CAD s/p CABG - Resume home medications Lipitor  6.  GERD - Continue famotidine   7.  Hypokalemia - Replace potassium and check potassium level - Magnesium  1.8  8.  Slightly elevated troponin at 41 - Plan for cardiology  9.  Gout - Continue allopurinol      DVT prophylaxis: SCDs due to history of bleeding Code Status: DNR/DNI(Do NOT Intubate) Family Communication: Family at bedside sisters Disposition Plan: Home Consults called: Cardiology Admission status: Inpatient, progressive care unit   Nena Rebel, MD [facility] 12/06/2023, 3:49 PM

## 2023-12-07 DIAGNOSIS — C259 Malignant neoplasm of pancreas, unspecified: Secondary | ICD-10-CM

## 2023-12-07 DIAGNOSIS — I4821 Permanent atrial fibrillation: Secondary | ICD-10-CM

## 2023-12-07 DIAGNOSIS — A419 Sepsis, unspecified organism: Secondary | ICD-10-CM | POA: Diagnosis not present

## 2023-12-07 DIAGNOSIS — D638 Anemia in other chronic diseases classified elsewhere: Secondary | ICD-10-CM

## 2023-12-07 DIAGNOSIS — I951 Orthostatic hypotension: Secondary | ICD-10-CM | POA: Diagnosis not present

## 2023-12-07 DIAGNOSIS — I4719 Other supraventricular tachycardia: Secondary | ICD-10-CM | POA: Diagnosis not present

## 2023-12-07 LAB — COMPREHENSIVE METABOLIC PANEL WITH GFR
ALT: 14 U/L (ref 0–44)
AST: 38 U/L (ref 15–41)
Albumin: 2.6 g/dL — ABNORMAL LOW (ref 3.5–5.0)
Alkaline Phosphatase: 105 U/L (ref 38–126)
Anion gap: 10 (ref 5–15)
BUN: 26 mg/dL — ABNORMAL HIGH (ref 8–23)
CO2: 20 mmol/L — ABNORMAL LOW (ref 22–32)
Calcium: 8.2 mg/dL — ABNORMAL LOW (ref 8.9–10.3)
Chloride: 111 mmol/L (ref 98–111)
Creatinine, Ser: 0.85 mg/dL (ref 0.44–1.00)
GFR, Estimated: >60 mL/min (ref >60)
Glucose, Bld: 93 mg/dL (ref 70–99)
Potassium: 2.7 mmol/L — CL (ref 3.5–5.1)
Sodium: 141 mmol/L (ref 135–145)
Total Bilirubin: 0.5 mg/dL (ref 0.0–1.2)
Total Protein: 5.3 g/dL — ABNORMAL LOW (ref 6.5–8.1)

## 2023-12-07 LAB — CBC
HCT: 26.8 % — ABNORMAL LOW (ref 36.0–46.0)
Hemoglobin: 8.7 g/dL — ABNORMAL LOW (ref 12.0–15.0)
MCH: 31.2 pg (ref 26.0–34.0)
MCHC: 32.5 g/dL (ref 30.0–36.0)
MCV: 96.1 fL (ref 80.0–100.0)
Platelets: 189 K/uL (ref 150–400)
RBC: 2.79 MIL/uL — ABNORMAL LOW (ref 3.87–5.11)
RDW: 21.1 % — ABNORMAL HIGH (ref 11.5–15.5)
WBC: 15.3 K/uL — ABNORMAL HIGH (ref 4.0–10.5)
nRBC: 0 % (ref 0.0–0.2)

## 2023-12-07 LAB — PROTIME-INR
INR: 1.3 — ABNORMAL HIGH (ref 0.8–1.2)
Prothrombin Time: 16.7 s — ABNORMAL HIGH (ref 11.4–15.2)

## 2023-12-07 LAB — HEPARIN LEVEL (UNFRACTIONATED): Heparin Unfractionated: 0.97 [IU]/mL — ABNORMAL HIGH (ref 0.30–0.70)

## 2023-12-07 LAB — APTT: aPTT: 31 s (ref 24–36)

## 2023-12-07 MED ORDER — HEPARIN BOLUS VIA INFUSION
4000.0000 [IU] | Freq: Once | INTRAVENOUS | Status: AC
  Administered 2023-12-07: 4000 [IU] via INTRAVENOUS
  Filled 2023-12-07: qty 4000

## 2023-12-07 MED ORDER — DILTIAZEM HCL 30 MG PO TABS: 30.0000 mg | ORAL_TABLET | Freq: Four times a day (QID) | ORAL | Status: DC

## 2023-12-07 MED ORDER — POTASSIUM CHLORIDE CRYS ER 20 MEQ PO TBCR: 40.0000 meq | EXTENDED_RELEASE_TABLET | Freq: Three times a day (TID) | ORAL | Status: DC

## 2023-12-07 MED ORDER — ACETAMINOPHEN 650 MG RE SUPP: 650.0000 mg | Freq: Four times a day (QID) | RECTAL | Status: DC | PRN

## 2023-12-07 MED ORDER — POTASSIUM CHLORIDE CRYS ER 20 MEQ PO TBCR
40.0000 meq | EXTENDED_RELEASE_TABLET | Freq: Two times a day (BID) | ORAL | Status: AC
  Administered 2023-12-07 (×2): 40 meq via ORAL
  Filled 2023-12-07 (×2): qty 2

## 2023-12-07 MED ORDER — MAGNESIUM SULFATE 2 GM/50ML IV SOLN
2.0000 g | Freq: Once | INTRAVENOUS | Status: AC
  Administered 2023-12-07: 2 g via INTRAVENOUS
  Filled 2023-12-07: qty 50

## 2023-12-07 MED ORDER — METOPROLOL TARTRATE 25 MG PO TABS: 12.5000 mg | ORAL_TABLET | Freq: Two times a day (BID) | ORAL | Status: DC

## 2023-12-07 MED ORDER — METOPROLOL TARTRATE 25 MG PO TABS
12.5000 mg | ORAL_TABLET | Freq: Four times a day (QID) | ORAL | Status: DC
  Administered 2023-12-07 – 2023-12-08 (×4): 12.5 mg via ORAL
  Filled 2023-12-07 (×4): qty 1

## 2023-12-07 MED ORDER — HEPARIN (PORCINE) 25000 UT/250ML-% IV SOLN
950.0000 [IU]/h | INTRAVENOUS | Status: DC
  Administered 2023-12-07: 1100 [IU]/h via INTRAVENOUS
  Filled 2023-12-07: qty 250

## 2023-12-07 MED ORDER — ACETAMINOPHEN 325 MG PO TABS: 650.0000 mg | ORAL_TABLET | Freq: Four times a day (QID) | ORAL | Status: DC | PRN

## 2023-12-07 NOTE — Consult Note (Signed)
 PHARMACY - ANTICOAGULATION CONSULT NOTE  Pharmacy Consult for Heparin  Indication: DVT  Allergies  Allergen Reactions   Fentanyl  Swelling    It messes with her heart.    Nsaids     Other Reaction(s): Not available   Oxycodone  Swelling    Patient Measurements: Height: 5' 4 (162.6 cm) Weight: 70.7 kg (155 lb 13.8 oz) IBW/kg (Calculated) : 54.7 HEPARIN  DW (KG): 69.1  Vital Signs: Temp: 97.6 F (36.4 C) (11/12 1929) Temp Source: Oral (11/12 1929) BP: 121/58 (11/12 2151) Pulse Rate: 70 (11/12 2151)  Labs: Recent Labs    12/06/23 1027 12/06/23 1117 12/07/23 0550 12/07/23 1100 12/07/23 2200  HGB 10.6*  --  8.7*  --   --   HCT 33.2*  --  26.8*  --   --   PLT 229  --  189  --   --   APTT  --   --   --  31  --   LABPROT  --   --  16.7*  --   --   INR  --   --  1.3*  --   --   HEPARINUNFRC  --   --   --   --  0.97*  CREATININE 0.95  --  0.85  --   --   CKTOTAL  --  43  --   --   --     Estimated Creatinine Clearance: 53.5 mL/min (by C-G formula based on SCr of 0.85 mg/dL).   Medical History: Past Medical History:  Diagnosis Date   Asthma    Cataract    COPD (chronic obstructive pulmonary disease) (HCC)    GERD (gastroesophageal reflux disease)    Gout    History of pulmonary embolus (PE)    Hyperlipidemia    Hypertension    Osteoarthrosis    Prediabetes     Medications:  Medications Prior to Admission  Medication Sig Dispense Refill Last Dose/Taking   acetaminophen  (TYLENOL ) 650 MG CR tablet Take 650 mg by mouth every 8 (eight) hours as needed for pain.   Taking As Needed   albuterol  (VENTOLIN  HFA) 108 (90 Base) MCG/ACT inhaler Inhale 2 puffs into the lungs every 4 (four) hours as needed for wheezing or shortness of breath. 8 g 2 Taking As Needed   allopurinol  (ZYLOPRIM ) 100 MG tablet Take 2 tablets (200 mg total) by mouth daily. 180 tablet 1 12/05/2023   amiodarone  (PACERONE ) 200 MG tablet Take 1 tablet (200 mg total) by mouth daily. 60 tablet 1 12/05/2023    atorvastatin (LIPITOR) 10 MG tablet Take 1 tablet (10 mg total) by mouth daily. 30 tablet 0 12/05/2023   BREZTRI  AEROSPHERE 160-9-4.8 MCG/ACT AERO inhaler INHALE 2 PUFFS TWICE DAILY 33 g 0 12/05/2023   cholecalciferol (VITAMIN D3) 25 MCG (1000 UNIT) tablet Take 1,000 Units by mouth daily.   12/05/2023   docusate sodium (COLACE) 100 MG capsule Take 1 capsule (100 mg total) by mouth 2 (two) times daily. 10 capsule 0 12/05/2023   Famotidine  (PEPCID  PO) Take 1 tablet by mouth at bedtime.   12/05/2023   fludrocortisone (FLORINEF) 0.1 MG tablet Take 1 tablet (0.1 mg total) by mouth daily. 30 tablet 0 12/05/2023   lidocaine -prilocaine  (EMLA ) cream Apply to affected area once 30 g 3 Taking   magic mouthwash (lidocaine , diphenhydrAMINE, alum & mag hydroxide) suspension Swish and spit 5 mLs 4 (four) times daily as needed for mouth pain. 360 mL 0 Taking As Needed   magnesium  chloride (SLOW-MAG) 64  MG TBEC SR tablet Take 1 tablet (64 mg total) by mouth daily. 30 tablet 0 12/05/2023   megestrol  (MEGACE ) 20 MG tablet Take 1 tablet (20 mg total) by mouth 2 (two) times daily. 60 tablet 0 12/05/2023   midodrine  (PROAMATINE ) 5 MG tablet Take 3 tablets (15 mg total) by mouth 3 (three) times daily with meals. 270 tablet 0 12/05/2023   Multiple Vitamins-Minerals (MULTIVITAMIN WOMEN 50+ PO) Take 1 tablet by mouth daily.   12/05/2023   ondansetron  (ZOFRAN ) 8 MG tablet Take 1 tablet (8 mg total) by mouth every 8 (eight) hours as needed for nausea or vomiting. 60 tablet 2 Taking As Needed   polyethylene glycol (MIRALAX  / GLYCOLAX ) 17 g packet Take 17 g by mouth 2 (two) times daily as needed for mild constipation. 60 each 0 Taking As Needed   potassium chloride  SA (KLOR-CON  M) 20 MEQ tablet Take 1 tablet (20 mEq total) by mouth daily.   12/05/2023   simethicone  (GAS-X) 80 MG chewable tablet Chew 1 tablet (80 mg total) by mouth 2 (two) times a day. 60 tablet 2 12/05/2023   Blood Pressure Monitoring (ADULT BLOOD PRESSURE  CUFF LG) KIT 1 each by Does not apply route daily. 1 kit 0    Elastic Bandages & Supports (ABDOMINAL BINDER/ELASTIC LARGE) MISC 1 Units by Does not apply route daily. 1 each 0    Scheduled:   allopurinol   200 mg Oral Daily   atorvastatin  10 mg Oral Daily   Chlorhexidine  Gluconate Cloth  6 each Topical Daily   collagenase   Topical Daily   famotidine   20 mg Oral BID   fludrocortisone  0.1 mg Oral Daily   metoprolol  tartrate  12.5 mg Oral Q6H   midodrine   15 mg Oral TID WC   sodium chloride  flush  10-40 mL Intracatheter Q12H   Infusions:   ceFEPime (MAXIPIME) IV 2 g (12/07/23 1436)   heparin  1,100 Units/hr (12/07/23 1158)   metronidazole 500 mg (12/07/23 1553)   vancomycin 1,000 mg (12/07/23 1554)   PRN: acetaminophen  **OR** acetaminophen , morphine injection, ondansetron  **OR** ondansetron  (ZOFRAN ) IV, mouth rinse, polyethylene glycol, simethicone , sodium chloride  flush  Assessment: 78 year old female with PMH of stage IV pancreatic cancer, HTN, pre-Dm, CKD, afib and hx of PE and hx of GI bleeding. Apixaban  was discontinued at discharge during last admission, but was on DVT ppx with enoxaparin  during the admission. Hgb is trending down 10.6 > 8.7, this is possibly due to hemodilution. Will use heparin  level to monitor as apixaban  is most likely washed out.   US : Positive for deep vein thrombosis in the left lower extremity involving the profunda femoral vein, femoral vein, popliteal vein and calf veins. Thrombus is nonocclusive in the profunda femoral and femoral veins and occlusive in the popliteal vein.   Goal of Therapy:  Heparin  level 0.3-0.7 units/ml Monitor platelets by anticoagulation protocol: Yes  11/12 2200 HL 0.97, supratherapeutic   Plan:  Decrease heparin  infusion to 950 units/hr Recheck HL w/ AM labs after rate change CBC daily while on heparin   Rankin CANDIE Dills, PharmD, [facility] 12/07/2023 10:47 PM

## 2023-12-07 NOTE — Plan of Care (Signed)

## 2023-12-07 NOTE — Consult Note (Signed)
 PHARMACY - ANTICOAGULATION CONSULT NOTE  Pharmacy Consult for Heparin  Indication: DVT  Allergies  Allergen Reactions   Fentanyl  Swelling    It messes with her heart.    Nsaids     Other Reaction(s): Not available   Oxycodone  Swelling    Patient Measurements: Height: 5' 4 (162.6 cm) Weight: 70.7 kg (155 lb 13.8 oz) IBW/kg (Calculated) : 54.7 HEPARIN  DW (KG): 69.1  Vital Signs: Temp: 98.3 F (36.8 C) (11/12 0300) Temp Source: Oral (11/12 0300) BP: 91/50 (11/12 0715) Pulse Rate: 85 (11/12 0300)  Labs: Recent Labs    12/06/23 1027 12/06/23 1117 12/07/23 0550  HGB 10.6*  --  8.7*  HCT 33.2*  --  26.8*  PLT 229  --  189  LABPROT  --   --  16.7*  INR  --   --  1.3*  CREATININE 0.95  --  0.85  CKTOTAL  --  43  --     Estimated Creatinine Clearance: 53.5 mL/min (by C-G formula based on SCr of 0.85 mg/dL).   Medical History: Past Medical History:  Diagnosis Date   Asthma    Cataract    COPD (chronic obstructive pulmonary disease) (HCC)    GERD (gastroesophageal reflux disease)    Gout    History of pulmonary embolus (PE)    Hyperlipidemia    Hypertension    Osteoarthrosis    Prediabetes     Medications:  Medications Prior to Admission  Medication Sig Dispense Refill Last Dose/Taking   acetaminophen  (TYLENOL ) 650 MG CR tablet Take 650 mg by mouth every 8 (eight) hours as needed for pain.   Taking As Needed   albuterol  (VENTOLIN  HFA) 108 (90 Base) MCG/ACT inhaler Inhale 2 puffs into the lungs every 4 (four) hours as needed for wheezing or shortness of breath. 8 g 2 Taking As Needed   allopurinol  (ZYLOPRIM ) 100 MG tablet Take 2 tablets (200 mg total) by mouth daily. 180 tablet 1 12/05/2023   amiodarone  (PACERONE ) 200 MG tablet Take 1 tablet (200 mg total) by mouth daily. 60 tablet 1 12/05/2023   atorvastatin (LIPITOR) 10 MG tablet Take 1 tablet (10 mg total) by mouth daily. 30 tablet 0 12/05/2023   BREZTRI  AEROSPHERE 160-9-4.8 MCG/ACT AERO inhaler INHALE 2  PUFFS TWICE DAILY 33 g 0 12/05/2023   cholecalciferol (VITAMIN D3) 25 MCG (1000 UNIT) tablet Take 1,000 Units by mouth daily.   12/05/2023   docusate sodium (COLACE) 100 MG capsule Take 1 capsule (100 mg total) by mouth 2 (two) times daily. 10 capsule 0 12/05/2023   Famotidine  (PEPCID  PO) Take 1 tablet by mouth at bedtime.   12/05/2023   fludrocortisone (FLORINEF) 0.1 MG tablet Take 1 tablet (0.1 mg total) by mouth daily. 30 tablet 0 12/05/2023   lidocaine -prilocaine  (EMLA ) cream Apply to affected area once 30 g 3 Taking   magic mouthwash (lidocaine , diphenhydrAMINE, alum & mag hydroxide) suspension Swish and spit 5 mLs 4 (four) times daily as needed for mouth pain. 360 mL 0 Taking As Needed   magnesium  chloride (SLOW-MAG) 64 MG TBEC SR tablet Take 1 tablet (64 mg total) by mouth daily. 30 tablet 0 12/05/2023   megestrol  (MEGACE ) 20 MG tablet Take 1 tablet (20 mg total) by mouth 2 (two) times daily. 60 tablet 0 12/05/2023   midodrine  (PROAMATINE ) 5 MG tablet Take 3 tablets (15 mg total) by mouth 3 (three) times daily with meals. 270 tablet 0 12/05/2023   Multiple Vitamins-Minerals (MULTIVITAMIN WOMEN 50+ PO) Take 1 tablet  by mouth daily.   12/05/2023   ondansetron  (ZOFRAN ) 8 MG tablet Take 1 tablet (8 mg total) by mouth every 8 (eight) hours as needed for nausea or vomiting. 60 tablet 2 Taking As Needed   polyethylene glycol (MIRALAX  / GLYCOLAX ) 17 g packet Take 17 g by mouth 2 (two) times daily as needed for mild constipation. 60 each 0 Taking As Needed   potassium chloride  SA (KLOR-CON  M) 20 MEQ tablet Take 1 tablet (20 mEq total) by mouth daily.   12/05/2023   simethicone  (GAS-X) 80 MG chewable tablet Chew 1 tablet (80 mg total) by mouth 2 (two) times a day. 60 tablet 2 12/05/2023   Blood Pressure Monitoring (ADULT BLOOD PRESSURE CUFF LG) KIT 1 each by Does not apply route daily. 1 kit 0    Elastic Bandages & Supports (ABDOMINAL BINDER/ELASTIC LARGE) MISC 1 Units by Does not apply route daily. 1  each 0    Scheduled:   allopurinol   200 mg Oral Daily   atorvastatin  10 mg Oral Daily   Chlorhexidine  Gluconate Cloth  6 each Topical Daily   collagenase   Topical Daily   famotidine   20 mg Oral BID   fludrocortisone  0.1 mg Oral Daily   metoprolol  tartrate  12.5 mg Oral Q6H   midodrine   15 mg Oral TID WC   potassium chloride   40 mEq Oral BID   sodium chloride  flush  10-40 mL Intracatheter Q12H   Infusions:   ceFEPime (MAXIPIME) IV 2 g (12/07/23 0037)   diltiazem (CARDIZEM) infusion 5 mg/hr (12/07/23 0704)   magnesium  sulfate bolus IVPB 2 g (12/07/23 1009)   metronidazole 500 mg (12/07/23 0119)   vancomycin     PRN: acetaminophen  **OR** acetaminophen , morphine injection, ondansetron  **OR** ondansetron  (ZOFRAN ) IV, mouth rinse, polyethylene glycol, simethicone , sodium chloride  flush  Assessment: 78 year old female with PMH of stage IV pancreatic cancer, HTN, pre-Dm, CKD, afib and hx of PE and hx of GI bleeding. Apixaban  was discontinued at discharge during last admission, but was on DVT ppx with enoxaparin  during the admission. Hgb is trending down 10.6 > 8.7, this is possibly due to hemodilution. Will use heparin  level to monitor as apixaban  is most likely washed out.   US : Positive for deep vein thrombosis in the left lower extremity involving the profunda femoral vein, femoral vein, popliteal vein and calf veins. Thrombus is nonocclusive in the profunda femoral and femoral veins and occlusive in the popliteal vein.   Goal of Therapy:  Heparin  level 0.3-0.7 units/ml Monitor platelets by anticoagulation protocol: Yes   Plan:  Give 4000 units bolus x 1 Start heparin  infusion at 1100 units/hr Check anti-Xa level in 8 hours and daily while on heparin  Continue to monitor H&H and platelets  Cathaleen GORMAN Blanch, PharmD, BCPS 12/07/2023,10:34 AM

## 2023-12-07 NOTE — Progress Notes (Signed)
 Assumed care of patient from John CN/RN. Pt assessed. Agree with previous assessment. No changes.

## 2023-12-07 NOTE — Progress Notes (Signed)
 Rounding Note   Patient Name: Colleen [name] Date of Encounter: 12/07/2023  [facility] Cardiologist: Redell Cave, MD   Subjective Resting comfortably in bed, looking for her breakfast Reports her breathing is improving Telemetry reviewed, appears to be sinus rhythm with MAT On diltiazem infusion 5 mg/h, blood pressure low, supported by midodrine  15 mg 3 times daily   Scheduled Meds:  allopurinol   200 mg Oral Daily   atorvastatin  10 mg Oral Daily   Chlorhexidine  Gluconate Cloth  6 each Topical Daily   collagenase   Topical Daily   famotidine   20 mg Oral BID   fludrocortisone  0.1 mg Oral Daily   heparin   4,000 Units Intravenous Once   metoprolol  tartrate  12.5 mg Oral Q6H   midodrine   15 mg Oral TID WC   potassium chloride   40 mEq Oral BID   sodium chloride  flush  10-40 mL Intracatheter Q12H   Continuous Infusions:  ceFEPime (MAXIPIME) IV 2 g (12/07/23 0037)   diltiazem (CARDIZEM) infusion 5 mg/hr (12/07/23 0704)   heparin      magnesium  sulfate bolus IVPB 2 g (12/07/23 1009)   metronidazole 500 mg (12/07/23 0119)   vancomycin     PRN Meds: acetaminophen  **OR** acetaminophen , morphine injection, ondansetron  **OR** ondansetron  (ZOFRAN ) IV, mouth rinse, polyethylene glycol, simethicone , sodium chloride  flush   Vital Signs  Vitals:   12/07/23 0655 12/07/23 0700 12/07/23 0709 12/07/23 0715  BP: (!) 101/47 (!) 87/48 (!) 89/51 (!) 91/50  Pulse:      Resp:  17 14 14   Temp:      TempSrc:      SpO2:      Weight:      Height:        Intake/Output Summary (Last 24 hours) at 12/07/2023 1052 Last data filed at 12/07/2023 0900 Gross per 24 hour  Intake 565.83 ml  Output --  Net 565.83 ml      12/06/2023   10:21 AM 11/14/2023    8:35 AM 11/11/2023    5:04 PM  Last 3 Weights  Weight (lbs) 155 lb 13.8 oz 179 lb 7.3 oz --  Weight (kg) 70.7 kg 81.4 kg --      Telemetry Multifocal atrial tachycardia- Personally Reviewed  ECG   - Personally  Reviewed  Physical Exam  GEN: No acute distress.   Neck: No JVD Cardiac: Irregular, no murmurs, rubs, or gallops.  Respiratory: Clear to auscultation bilaterally. GI: Soft, nontender, non-distended  MS: No edema; No deformity. Neuro:  Nonfocal  Psych: Normal affect   Labs High Sensitivity Troponin:   Recent Labs  Lab 11/09/23 1617 11/09/23 1826 11/10/23 0117 11/10/23 0657  TROPONINIHS 105* 121* 136* 134*     Chemistry Recent Labs  Lab 12/06/23 1027 12/06/23 1117 12/07/23 0550  NA 143  --  141  K 3.4*  --  2.7*  CL 114*  --  111  CO2 16*  --  20*  GLUCOSE 133*  --  93  BUN 27*  --  26*  CREATININE 0.95  --  0.85  CALCIUM  8.1*  --  8.2*  MG  --  1.8  --   PROT 5.7*  --  5.3*  ALBUMIN 2.6*  --  2.6*  AST 38  --  38  ALT 13  --  14  ALKPHOS 121  --  105  BILITOT 0.8  --  0.5  GFRNONAA >60  --  >60  ANIONGAP 14  --  10  Lipids No results for input(s): CHOL, TRIG, HDL, LABVLDL, LDLCALC, CHOLHDL in the last 168 hours.  Hematology Recent Labs  Lab 12/06/23 1027 12/07/23 0550  WBC 18.4* 15.3*  RBC 3.41* 2.79*  HGB 10.6* 8.7*  HCT 33.2* 26.8*  MCV 97.4 96.1  MCH 31.1 31.2  MCHC 31.9 32.5  RDW 21.5* 21.1*  PLT 229 189   Thyroid   Recent Labs  Lab 12/06/23 1238  TSH 5.050*    BNP Recent Labs  Lab 12/06/23 1117  PROBNP 4,428.0*    DDimer No results for input(s): DDIMER in the last 168 hours.   Radiology  US  Venous Img Lower Bilateral Result Date: 12/06/2023 CLINICAL DATA:  Left lower extremity edema. EXAM: LEFT LOWER EXTREMITY VENOUS DOPPLER ULTRASOUND TECHNIQUE: Gray-scale sonography with graded compression, as well as color Doppler and duplex ultrasound were performed to evaluate the lower extremity deep venous systems from the level of the common femoral vein and including the common femoral, femoral, profunda femoral, popliteal and calf veins including the posterior tibial, peroneal and gastrocnemius veins when visible. The  superficial great saphenous vein was also interrogated. Spectral Doppler was utilized to evaluate flow at rest and with distal augmentation maneuvers in the common femoral, femoral and popliteal veins. COMPARISON:  None Available. FINDINGS: Contralateral Common Femoral Vein: Respiratory phasicity is normal and symmetric with the symptomatic side. No evidence of thrombus. Normal compressibility. Common Femoral Vein: No evidence of thrombus. Normal compressibility, respiratory phasicity and response to augmentation. Saphenofemoral Junction: No evidence of thrombus. Normal compressibility and flow on color Doppler imaging. Profunda Femoral Vein: Nonocclusive thrombus in the visualized profunda femoral vein. Femoral Vein: Nonocclusive thrombus in the visualized femoral vein. Popliteal Vein: Occlusive thrombus in the visualized popliteal vein. Calf Veins: Thrombus in the visualized posterior tibial and peroneal veins. Superficial Great Saphenous Vein: No evidence of thrombus. Normal compressibility. Venous Reflux:  None. Other Findings: No evidence of superficial thrombophlebitis or abnormal fluid collection. IMPRESSION: Positive for deep vein thrombosis in the left lower extremity involving the profunda femoral vein, femoral vein, popliteal vein and calf veins. Thrombus is nonocclusive in the profunda femoral and femoral veins and occlusive in the popliteal vein. Electronically Signed   By: Marcey Moan M.D.   On: 12/06/2023 14:38   DG Chest Portable 1 View Result Date: 12/06/2023 EXAM: 1 VIEW(S) XRAY OF THE CHEST 12/06/2023 11:27:36 AM COMPARISON: 11/09/2023 CLINICAL HISTORY: weakness FINDINGS: LINES, TUBES AND DEVICES: Right internal jugular port-a-cath is unchanged. LUNGS AND PLEURA: No focal pulmonary opacity. No pulmonary edema. No pleural effusion. No pneumothorax. HEART AND MEDIASTINUM: No acute abnormality of the cardiac and mediastinal silhouettes. BONES AND SOFT TISSUES: No acute osseous abnormality.  IMPRESSION: 1. No acute cardiopulmonary process. Electronically signed by: Lynwood Seip MD 12/06/2023 11:37 AM EST RP Workstation: [id number]    Cardiac Studies Echo October 29, 2023  1. Limited study.   2. Left ventricular ejection fraction, by estimation, is 55 to 60%. The  left ventricle has normal function. Left ventricular diastolic function  could not be evaluated.   3. Right ventricular systolic function is normal. The right ventricular  size is normal.   4. Moderate pleural effusion. No pericardial effusion seen.   5. The inferior vena cava is normal in size with <50% respiratory  variability, suggesting right atrial pressure of 8 mmHg.   Patient Profile   78 y.o. female with history of stage IV metastatic pancreatic cancer, hypertension, hyperlipidemia, paroxysmal atrial fibrillation, COPD, and gout, whom we have been asked to see due  to atrial fibrillation with rapid ventricular response.   Assessment & Plan  Atrial fibrillation with RVR/MAT History of PAF, previously on amiodarone  without anticoagulation given anemia - Telemetry reviewed, clearly frequent sinus rhythm seen, likely MAT - Will hold the diltiazem infusion, start metoprolol  tartrate 12.5 mg every 6 hours with increasing metoprolol  as blood pressure tolerates, dosing limited secondary to hypotension -Not on anticoagulation at this time given metastatic pancreatic cancer, anemia hemoglobin 8.7  Hypotension In the setting of metastatic pancreatic cancer - On midodrine  15 mg 3 times daily, fludrocortisone 0.1 daily  Chronic anemia Hemoglobin 8 up to 10 Anemia of chronic disease  Primary pancreatic cancer with metastases Stage IV metastatic pancreatic cancer, followed by oncology Palliative following   For questions or updates, please contact [facility] Please consult [email or web address] for contact info under       Signed, Chrsitopher Wik, MD  12/07/2023, 10:52 AM

## 2023-12-07 NOTE — Progress Notes (Signed)
 PROGRESS NOTE    [name]  FMW:[id number] DOB: November 05, 1945 DOA: 12/06/2023 PCP: Leavy Mole, PA-C  Chief Complaint  Patient presents with   Altered Mental Status   Near Syncope    Hospital Course:  Colleen [name] is a 78 year old female with stage IV pancreatic cancer on chemotherapy, COPD, hypertension, hyperlipidemia, prediabetes, gout, GERD, CKD stage III, anemia, A-fib and history of PE not currently on anticoagulation due to history of GI bleed, lumbar spinal stenosis, who presented on 11/11 with an episode of syncope at home.  Patient was reportedly on the toilet for 5 hours before her niece found her.  On arrival she was found A-fib RVR with a rate of 172.  Patient was started on amiodarone  drip.  She was also found of a leukocytosis of 18.4 and started on broad-spectrum antibiotics.    Subjective: Patient denies any acute events this morning.  Denies any pain at this time.  She is interested in pursuing hospice.  She would like to speak with palliative care team.   Objective: Vitals:   12/07/23 0709 12/07/23 0715 12/07/23 1400 12/07/23 1800  BP: (!) 89/51 (!) 91/50 (!) 119/58 (!) 111/51  Pulse:      Resp: 14 14 16 19   Temp:      TempSrc:      SpO2:      Weight:      Height:        Intake/Output Summary (Last 24 hours) at 12/07/2023 1923 Last data filed at 12/07/2023 0900 Gross per 24 hour  Intake 565.83 ml  Output --  Net 565.83 ml   Filed Weights   12/06/23 1021  Weight: 70.7 kg    Examination: General exam: Appears calm and comfortable, NAD, weak and frail appearing Respiratory system: No work of breathing, symmetric chest wall expansion Cardiovascular system: S1 & S2 heard, RRR.  Gastrointestinal system: Abdomen is nondistended, soft and nontender.  Neuro: Alert and oriented.  Extremities: Symmetric, expected ROM Skin: No rashes, lesions  Assessment & Plan:  Principal Problem:   Atrial fibrillation with RVR (HCC) Active Problems:   Orthostatic  hypotension   Primary pancreatic cancer with metastasis to other site [facility])   Syncope   Anemia of chronic disease   GERD (gastroesophageal reflux disease)   Sepsis (HCC)   Multifocal atrial tachycardia    Poor prognosis - In light of patient's stage IV pancreatic cancer, severe hypotension, A-fib RVR, new DVT, and progressive weakness with increasing hospital stays she is an excellent hospice candidate.  She is amendable to this and would like to consider her options with the palliative care team.  I have consulted palliative care and oncology palliative care who will meet with the patient for further goals of care discussions.  A-fib RVR History of paroxysmal atrial fibrillation - Previously on amiodarone  without anticoagulation due to anemia - Cardiology was consulted.  Have discontinued diltiazem and started on metoprolol  to tartrate.  Will increase as BP tolerates - She is currently on heparin  drip due to acute DVT  Hypotension - In the setting of metastatic prostate cancer - Takes midodrine  15 mg 3 times daily at home as well as fludrocortisone.  Continue these for now.  Left leg DVT - Have started on heparin  drip.  Have discussed with patient and she is amendable to this for now but she is interested in hospice.  Leukocytosis - Unclear etiology.  Complicated by active malignancy and new DVT. - COVID/flu/RSV negative - Blood cultures negative to date,  will continue to follow - CXR without acute cardiopulmonary process.  Patient denies any urinary tract symptoms. - She is currently on broad-spectrum antibiotics with persistent leukocytosis.  No fever.  Will continue antibiotics for a full 48 hours and then consider discontinuation.  Hypokalemia - Continue to replace as needed.  Repeat in AM.  Stage IV metastatic pancreatic cancer - Follows outpatient with oncology - Palliative care consulted, appreciate their discussions as she is well-established with the palliative  oncology team  Chronic anemia Of chronic disease - Currently stable - Trend CBC  Hyperlipidemia - Minimal benefit to statin at this time.  DVT prophylaxis: Heparin  gtt   Code Status: Limited: Do not attempt resuscitation (DNR) -DNR-LIMITED -Do Not Intubate/DNI  Disposition:  Inpatient pending clinical resolution  Consultants:  Treatment Team:  Consulting Physician: Mady Bruckner, MD  Procedures:    Antimicrobials:  Anti-infectives (From admission, onward)    Start     Dose/Rate Route Frequency Ordered Stop   12/07/23 1400  vancomycin (VANCOCIN) IVPB 1000 mg/200 mL premix        1,000 mg 200 mL/hr over 60 Minutes Intravenous Every 24 hours 12/06/23 1340     12/07/23 0130  ceFEPIme (MAXIPIME) 2 g in sodium chloride  0.9 % 100 mL IVPB        2 g 200 mL/hr over 30 Minutes Intravenous Every 12 hours 12/06/23 1327     12/06/23 2200  metroNIDAZOLE (FLAGYL) IVPB 500 mg  Status:  Discontinued        500 mg 100 mL/hr over 60 Minutes Intravenous Every 12 hours 12/06/23 1315 12/06/23 1342   12/06/23 1400  metroNIDAZOLE (FLAGYL) IVPB 500 mg        500 mg 100 mL/hr over 60 Minutes Intravenous Every 12 hours 12/06/23 1342     12/06/23 1330  vancomycin (VANCOREADY) IVPB 1750 mg/350 mL        1,750 mg 175 mL/hr over 120 Minutes Intravenous Once 12/06/23 1329 12/06/23 1708   12/06/23 1245  ceFEPIme (MAXIPIME) 2 g in sodium chloride  0.9 % 100 mL IVPB        2 g 200 mL/hr over 30 Minutes Intravenous  Once 12/06/23 1244 12/06/23 1346   12/06/23 1245  metroNIDAZOLE (FLAGYL) IVPB 500 mg  Status:  Discontinued        500 mg 100 mL/hr over 60 Minutes Intravenous  Once 12/06/23 1244 12/06/23 1342   12/06/23 1245  vancomycin (VANCOCIN) IVPB 1000 mg/200 mL premix  Status:  Discontinued        1,000 mg 200 mL/hr over 60 Minutes Intravenous  Once 12/06/23 1244 12/06/23 1329       Data Reviewed: I have personally reviewed following labs and imaging studies CBC: Recent Labs  Lab  12/06/23 1027 12/07/23 0550  WBC 18.4* 15.3*  NEUTROABS 10.9*  --   HGB 10.6* 8.7*  HCT 33.2* 26.8*  MCV 97.4 96.1  PLT 229 189   Basic Metabolic Panel: Recent Labs  Lab 12/06/23 1027 12/06/23 1117 12/07/23 0550  NA 143  --  141  K 3.4*  --  2.7*  CL 114*  --  111  CO2 16*  --  20*  GLUCOSE 133*  --  93  BUN 27*  --  26*  CREATININE 0.95  --  0.85  CALCIUM  8.1*  --  8.2*  MG  --  1.8  --    GFR: Estimated Creatinine Clearance: 53.5 mL/min (by C-G formula based on SCr of 0.85 mg/dL). Liver Function  Tests: Recent Labs  Lab 12/06/23 1027 12/07/23 0550  AST 38 38  ALT 13 14  ALKPHOS 121 105  BILITOT 0.8 0.5  PROT 5.7* 5.3*  ALBUMIN 2.6* 2.6*   CBG: No results for input(s): GLUCAP in the last 168 hours.  Recent Results (from the past 240 hours)  Blood culture (routine x 2)     Status: None (Preliminary result)   Collection Time: 12/06/23 11:17 AM   Specimen: BLOOD  Result Value Ref Range Status   Specimen Description BLOOD BLOOD RIGHT FOREARM  Final   Special Requests   Final    BOTTLES DRAWN AEROBIC ONLY Blood Culture results may not be optimal due to an inadequate volume of blood received in culture bottles   Culture   Final    NO GROWTH < 24 HOURS Performed at [facility], [address] [place], [place] [address]    Report Status PENDING  Incomplete  Blood culture (routine x 2)     Status: None (Preliminary result)   Collection Time: 12/06/23 11:17 AM   Specimen: BLOOD  Result Value Ref Range Status   Specimen Description BLOOD RIGHT ANTECUBITAL  Final   Special Requests   Final    BOTTLES DRAWN AEROBIC AND ANAEROBIC Blood Culture results may not be optimal due to an inadequate volume of blood received in culture bottles   Culture   Final    NO GROWTH < 24 HOURS Performed at [facility], [address], [place], [place] [address]    Report Status PENDING  Incomplete  Resp panel by RT-PCR (RSV, Flu A&B, Covid) Anterior Nasal  Swab     Status: None   Collection Time: 12/06/23 11:17 AM   Specimen: Anterior Nasal Swab  Result Value Ref Range Status   SARS Coronavirus 2 by RT PCR NEGATIVE NEGATIVE Final    Comment: (NOTE) SARS-CoV-2 target nucleic acids are NOT DETECTED.  The SARS-CoV-2 RNA is generally detectable in upper respiratory specimens during the acute phase of infection. The lowest concentration of SARS-CoV-2 viral copies this assay can detect is 138 copies/mL. A negative result does not preclude SARS-Cov-2 infection and should not be used as the sole basis for treatment or other patient management decisions. A negative result may occur with  improper specimen collection/handling, submission of specimen other than nasopharyngeal swab, presence of viral mutation(s) within the areas targeted by this assay, and inadequate number of viral copies(<138 copies/mL). A negative result must be combined with clinical observations, patient history, and epidemiological information. The expected result is Negative.  Fact Sheet for Patients:  bloggercourse.com  Fact Sheet for Healthcare Providers:  seriousbroker.it  This test is no t yet approved or cleared by the [place]  FDA and  has been authorized for detection and/or diagnosis of SARS-CoV-2 by FDA under an Emergency Use Authorization (EUA). This EUA will remain  in effect (meaning this test can be used) for the duration of the COVID-19 declaration under Section 564(b)(1) of the Act, 21 U.S.C.section 360bbb-3(b)(1), unless the authorization is terminated  or revoked sooner.       Influenza A by PCR NEGATIVE NEGATIVE Final   Influenza B by PCR NEGATIVE NEGATIVE Final    Comment: (NOTE) The Xpert Xpress SARS-CoV-2/FLU/RSV plus assay is intended as an aid in the diagnosis of influenza from Nasopharyngeal swab specimens and should not be used as a sole basis for treatment. Nasal washings and aspirates  are unacceptable for Xpert Xpress SARS-CoV-2/FLU/RSV testing.  Fact Sheet for Patients:  bloggercourse.com  Fact Sheet for Healthcare Providers: seriousbroker.it  This test is not yet approved or cleared by the [place]  FDA and has been authorized for detection and/or diagnosis of SARS-CoV-2 by FDA under an Emergency Use Authorization (EUA). This EUA will remain in effect (meaning this test can be used) for the duration of the COVID-19 declaration under Section 564(b)(1) of the Act, 21 U.S.C. section 360bbb-3(b)(1), unless the authorization is terminated or revoked.     Resp Syncytial Virus by PCR NEGATIVE NEGATIVE Final    Comment: (NOTE) Fact Sheet for Patients: bloggercourse.com  Fact Sheet for Healthcare Providers: seriousbroker.it  This test is not yet approved or cleared by the [place]  FDA and has been authorized for detection and/or diagnosis of SARS-CoV-2 by FDA under an Emergency Use Authorization (EUA). This EUA will remain in effect (meaning this test can be used) for the duration of the COVID-19 declaration under Section 564(b)(1) of the Act, 21 U.S.C. section 360bbb-3(b)(1), unless the authorization is terminated or revoked.  Performed at [facility], [address] [place], [place] [address]      Radiology Studies: US  Venous Img Lower Bilateral Result Date: 12/06/2023 CLINICAL DATA:  Left lower extremity edema. EXAM: LEFT LOWER EXTREMITY VENOUS DOPPLER ULTRASOUND TECHNIQUE: Gray-scale sonography with graded compression, as well as color Doppler and duplex ultrasound were performed to evaluate the lower extremity deep venous systems from the level of the common femoral vein and including the common femoral, femoral, profunda femoral, popliteal and calf veins including the posterior tibial, peroneal and gastrocnemius veins when visible. The  superficial great saphenous vein was also interrogated. Spectral Doppler was utilized to evaluate flow at rest and with distal augmentation maneuvers in the common femoral, femoral and popliteal veins. COMPARISON:  None Available. FINDINGS: Contralateral Common Femoral Vein: Respiratory phasicity is normal and symmetric with the symptomatic side. No evidence of thrombus. Normal compressibility. Common Femoral Vein: No evidence of thrombus. Normal compressibility, respiratory phasicity and response to augmentation. Saphenofemoral Junction: No evidence of thrombus. Normal compressibility and flow on color Doppler imaging. Profunda Femoral Vein: Nonocclusive thrombus in the visualized profunda femoral vein. Femoral Vein: Nonocclusive thrombus in the visualized femoral vein. Popliteal Vein: Occlusive thrombus in the visualized popliteal vein. Calf Veins: Thrombus in the visualized posterior tibial and peroneal veins. Superficial Great Saphenous Vein: No evidence of thrombus. Normal compressibility. Venous Reflux:  None. Other Findings: No evidence of superficial thrombophlebitis or abnormal fluid collection. IMPRESSION: Positive for deep vein thrombosis in the left lower extremity involving the profunda femoral vein, femoral vein, popliteal vein and calf veins. Thrombus is nonocclusive in the profunda femoral and femoral veins and occlusive in the popliteal vein. Electronically Signed   By: Marcey Moan M.D.   On: 12/06/2023 14:38   DG Chest Portable 1 View Result Date: 12/06/2023 EXAM: 1 VIEW(S) XRAY OF THE CHEST 12/06/2023 11:27:36 AM COMPARISON: 11/09/2023 CLINICAL HISTORY: weakness FINDINGS: LINES, TUBES AND DEVICES: Right internal jugular port-a-cath is unchanged. LUNGS AND PLEURA: No focal pulmonary opacity. No pulmonary edema. No pleural effusion. No pneumothorax. HEART AND MEDIASTINUM: No acute abnormality of the cardiac and mediastinal silhouettes. BONES AND SOFT TISSUES: No acute osseous abnormality.  IMPRESSION: 1. No acute cardiopulmonary process. Electronically signed by: Lynwood Seip MD 12/06/2023 11:37 AM EST RP Workstation: [id number]    Scheduled Meds:  allopurinol   200 mg Oral Daily   atorvastatin  10 mg Oral Daily   Chlorhexidine  Gluconate Cloth  6 each Topical Daily   collagenase   Topical Daily  famotidine   20 mg Oral BID   fludrocortisone  0.1 mg Oral Daily   metoprolol  tartrate  12.5 mg Oral Q6H   midodrine   15 mg Oral TID WC   potassium chloride   40 mEq Oral BID   sodium chloride  flush  10-40 mL Intracatheter Q12H   Continuous Infusions:  ceFEPime (MAXIPIME) IV 2 g (12/07/23 1436)   heparin  1,100 Units/hr (12/07/23 1158)   metronidazole 500 mg (12/07/23 1553)   vancomycin 1,000 mg (12/07/23 1554)     LOS: 1 day  MDM: Patient is high risk for one or more organ failure.  They necessitate ongoing hospitalization for continued IV therapies and subsequent lab monitoring. Total time spent interpreting labs and vitals, reviewing the medical record, coordinating care amongst consultants and care team members, directly assessing and discussing care with the patient and/or family: 55 min   Gilberte Gorley, DO [facility]  To contact the attending physician between 7A-7P please use Epic Chat. To contact the covering physician during after hours 7P-7A, please review Amion.  12/07/2023, 7:23 PM   *This document has been created with the assistance of dictation software. Please excuse typographical errors. *

## 2023-12-08 ENCOUNTER — Other Ambulatory Visit: Payer: Self-pay | Admitting: Oncology

## 2023-12-08 ENCOUNTER — Ambulatory Visit: Payer: PPO

## 2023-12-08 DIAGNOSIS — D638 Anemia in other chronic diseases classified elsewhere: Secondary | ICD-10-CM | POA: Diagnosis not present

## 2023-12-08 DIAGNOSIS — K922 Gastrointestinal hemorrhage, unspecified: Secondary | ICD-10-CM | POA: Diagnosis not present

## 2023-12-08 DIAGNOSIS — Z515 Encounter for palliative care: Secondary | ICD-10-CM

## 2023-12-08 DIAGNOSIS — C259 Malignant neoplasm of pancreas, unspecified: Secondary | ICD-10-CM | POA: Diagnosis not present

## 2023-12-08 DIAGNOSIS — I4821 Permanent atrial fibrillation: Secondary | ICD-10-CM | POA: Diagnosis not present

## 2023-12-08 DIAGNOSIS — I4891 Unspecified atrial fibrillation: Secondary | ICD-10-CM | POA: Diagnosis not present

## 2023-12-08 DIAGNOSIS — I951 Orthostatic hypotension: Secondary | ICD-10-CM | POA: Diagnosis not present

## 2023-12-08 DIAGNOSIS — I4719 Other supraventricular tachycardia: Secondary | ICD-10-CM | POA: Diagnosis not present

## 2023-12-08 DIAGNOSIS — I82519 Chronic embolism and thrombosis of unspecified femoral vein: Secondary | ICD-10-CM

## 2023-12-08 LAB — CBC
HCT: 21 % — ABNORMAL LOW (ref 36.0–46.0)
Hemoglobin: 6.8 g/dL — ABNORMAL LOW (ref 12.0–15.0)
MCH: 31.2 pg (ref 26.0–34.0)
MCHC: 32.4 g/dL (ref 30.0–36.0)
MCV: 96.3 fL (ref 80.0–100.0)
Platelets: 175 K/uL (ref 150–400)
RBC: 2.18 MIL/uL — ABNORMAL LOW (ref 3.87–5.11)
RDW: 21.3 % — ABNORMAL HIGH (ref 11.5–15.5)
WBC: 17.5 K/uL — ABNORMAL HIGH (ref 4.0–10.5)
nRBC: 0 % (ref 0.0–0.2)

## 2023-12-08 LAB — BASIC METABOLIC PANEL WITH GFR
Anion gap: 6 (ref 5–15)
BUN: 23 mg/dL (ref 8–23)
CO2: 19 mmol/L — ABNORMAL LOW (ref 22–32)
Calcium: 7.9 mg/dL — ABNORMAL LOW (ref 8.9–10.3)
Chloride: 114 mmol/L — ABNORMAL HIGH (ref 98–111)
Creatinine, Ser: 0.74 mg/dL (ref 0.44–1.00)
GFR, Estimated: >60 mL/min (ref >60)
Glucose, Bld: 113 mg/dL — ABNORMAL HIGH (ref 70–99)
Potassium: 3.7 mmol/L (ref 3.5–5.1)
Sodium: 139 mmol/L (ref 135–145)

## 2023-12-08 LAB — HEPARIN LEVEL (UNFRACTIONATED)
Heparin Unfractionated: >1.1 [IU]/mL — ABNORMAL HIGH (ref 0.30–0.70)
Heparin Unfractionated: 0.6 [IU]/mL (ref 0.30–0.70)

## 2023-12-08 LAB — HEMOGLOBIN AND HEMATOCRIT, BLOOD
HCT: 21.2 % — ABNORMAL LOW (ref 36.0–46.0)
Hemoglobin: 6.8 g/dL — ABNORMAL LOW (ref 12.0–15.0)

## 2023-12-08 LAB — GLUCOSE, CAPILLARY: Glucose-Capillary: 90 mg/dL (ref 70–99)

## 2023-12-08 MED ORDER — LORAZEPAM 2 MG/ML IJ SOLN: 2.0000 mg | INTRAMUSCULAR | Status: DC | PRN

## 2023-12-08 MED ORDER — GLYCOPYRROLATE 1 MG PO TABS: 1.0000 mg | ORAL_TABLET | ORAL | Status: DC | PRN

## 2023-12-08 MED ORDER — SODIUM CHLORIDE 0.9 % IV BOLUS
500.0000 mL | INTRAVENOUS | Status: AC
  Administered 2023-12-08: 500 mL via INTRAVENOUS

## 2023-12-08 MED ORDER — POLYVINYL ALCOHOL 1.4 % OP SOLN: 1.0000 [drp] | Freq: Four times a day (QID) | OPHTHALMIC | Status: DC | PRN

## 2023-12-08 MED ORDER — GLYCOPYRROLATE 0.2 MG/ML IJ SOLN: 0.2000 mg | INTRAMUSCULAR | Status: DC | PRN

## 2023-12-08 MED ORDER — SODIUM CHLORIDE 0.9% IV SOLUTION: Freq: Once | INTRAVENOUS | Status: AC

## 2023-12-08 MED ORDER — SODIUM CHLORIDE 0.9 % IV BOLUS
500.0000 mL | Freq: Once | INTRAVENOUS | Status: AC
  Administered 2023-12-08: 500 mL via INTRAVENOUS

## 2023-12-08 MED ORDER — SODIUM CHLORIDE 0.9 % IV SOLN: INTRAVENOUS | Status: DC

## 2023-12-08 NOTE — Care Management Important Message (Signed)
 Important Message  Patient Details  Name: Colleen [name] MRN: [id number] Date of Birth: [birth year]   Important Message Given:  Yes - Medicare IM     [name] 12/08/2023, 2:39 PM

## 2023-12-08 NOTE — Discharge Summary (Signed)
 DISCHARGE SUMMARY    Colleen [name] FMW:[id number] DOB: [birth year] DOA: 12/06/2023  PCP: Leavy Mole, PA-C  Admit date: 12/06/2023 Discharge date: 12/08/2023  Hospital Course: Colleen [name] is a 78 year old female with stage IV pancreatic cancer on chemotherapy, COPD, hypertension, hyperlipidemia, prediabetes, gout, GERD, CKD stage III, anemia, A-fib and history of PE not currently on anticoagulation due to history of GI bleed, lumbar spinal stenosis, who presented on 11/11 with an episode of syncope at home.  Patient was reportedly on the toilet for 5 hours before her niece found her.  On arrival she was found A-fib RVR with a rate of 172.  Patient was started on amiodarone  drip.  She was also found of a leukocytosis of 18.4 and started on broad-spectrum antibiotics.  She had some improvement in her A-fib.  She was then found to have a left lower extremity DVT and started on heparin  drip. Early in the morning on 11/13 patient had large bloody bowel movement and became hypotensive.  Rapid response was called.  She received 1 unit PRBC and fluid boluses with improvement in hypotension. Upon further discussion with the patient and her niece, POA, they ultimately elected to proceed with comfort measures only and transition to hospice outpatient.  She is discharging directly to hospice home this evening.  Diagnoses: Comfort measures only, discharging directly to hospice home Stage IV pancreatic cancer Acute on chronic hypotension A-fib RVR New DVT left leg unable to tolerate anticoagulation History of GI bleeds Progressive weakness   Acute on chronic Hypotension - In the setting of metastatic prostate cancer - Exacerbated by acute blood loss today.  Received 1 L NS bolus and 1 unit PRBC. - Takes midodrine  15 mg 3 times daily at home as well as fludrocortisone.     A-fib RVR History of paroxysmal atrial fibrillation - Previously on amiodarone  without anticoagulation due to anemia/prior  GIB - Cardiology was consulted.  Have discontinued diltiazem and started on metoprolol  to tartrate.  Unlikely to tolerate metoprolol  like current blood pressure. - Heparin  drip discontinued   Left leg DVT - Was initiated on heparin  drip but unable to tolerate given large bloody bowel movement today.  Has been discontinued.   Leukocytosis - Unclear etiology.  Complicated by active malignancy and new DVT. - COVID/flu/RSV negative - Blood cultures negative to date, will continue to follow - CXR without acute cardiopulmonary process.  Patient denies any urinary tract symptoms. - Was on broad-spectrum antibiotics.  Have been discontinued now.  Hypokalemia - Continue to replace as needed.  Repeat in AM.   Stage IV metastatic pancreatic cancer  Chronic anemia of chronic disease  Hyperlipidemia - Minimal benefit to statin at this time. Discharge Instructions  Discharge Instructions     Amb referral to [facility]   Complete by: As directed    Call MD for:  difficulty breathing, headache or visual disturbances   Complete by: As directed    Call MD for:  persistant dizziness or light-headedness   Complete by: As directed    Call MD for:  persistant nausea and vomiting   Complete by: As directed    Call MD for:  severe uncontrolled pain   Complete by: As directed    Call MD for:  temperature >100.4   Complete by: As directed    Diet general   Complete by: As directed    Discharge wound care:   Complete by: As directed    1. Cleanse L buttock wound with Vashe wound cleanser (  [place] [phone]) do not rinse and allow to air dry.  Apply 1/4 thick layer of Santyl to wound bed daily, fill in wound with saline moistened gauze and cover with dry gauze and silicone foam or ABD pad and clothe tape whichever is preferred.  2.  Cover healing R buttocks wound with silicone foam, lift daily to assess. Change foam q3 days and prn soiling.      Allergies as of 12/08/2023       Reactions    Fentanyl  Swelling   It messes with her heart.    Nsaids    Other Reaction(s): Not available   Oxycodone  Swelling        Medication List     STOP taking these medications    acetaminophen  650 MG CR tablet Commonly known as: TYLENOL    Adult Blood Pressure Cuff Lg Kit   allopurinol  100 MG tablet Commonly known as: ZYLOPRIM    cholecalciferol 25 MCG (1000 UNIT) tablet Commonly known as: VITAMIN D3   docusate sodium 100 MG capsule Commonly known as: COLACE   magnesium  chloride 64 MG Tbec SR tablet Commonly known as: SLOW-MAG   MULTIVITAMIN WOMEN 50+ PO   polyethylene glycol 17 g packet Commonly known as: MIRALAX  / GLYCOLAX    potassium chloride  SA 20 MEQ tablet Commonly known as: KLOR-CON  M       TAKE these medications    Abdominal Binder/Elastic Large Misc 1 Units by Does not apply route daily.   albuterol  108 (90 Base) MCG/ACT inhaler Commonly known as: VENTOLIN  HFA Inhale 2 puffs into the lungs every 4 (four) hours as needed for wheezing or shortness of breath.   amiodarone  200 MG tablet Commonly known as: Pacerone  Take 1 tablet (200 mg total) by mouth daily.   atorvastatin 10 MG tablet Commonly known as: LIPITOR Take 1 tablet (10 mg total) by mouth daily.   Breztri  Aerosphere 160-9-4.8 MCG/ACT Aero inhaler Generic drug: budesonide -glycopyrrolate -formoterol  INHALE 2 PUFFS TWICE DAILY   fludrocortisone 0.1 MG tablet Commonly known as: FLORINEF Take 1 tablet (0.1 mg total) by mouth daily.   magic mouthwash (lidocaine , diphenhydrAMINE, alum & mag hydroxide) suspension Swish and spit 5 mLs 4 (four) times daily as needed for mouth pain.   megestrol  20 MG tablet Commonly known as: MEGACE  Take 1 tablet (20 mg total) by mouth 2 (two) times daily.   midodrine  5 MG tablet Commonly known as: PROAMATINE  Take 3 tablets (15 mg total) by mouth 3 (three) times daily with meals.   PEPCID  PO Take 1 tablet by mouth at bedtime.   simethicone  80 MG chewable  tablet Commonly known as: Gas-X Chew 1 tablet (80 mg total) by mouth 2 (two) times a day.               Discharge Care Instructions  (From admission, onward)           Start     Ordered   12/08/23 0000  Discharge wound care:       Comments: 1. Cleanse L buttock wound with Vashe wound cleanser [employer] [phone]) do not rinse and allow to air dry.  Apply 1/4 thick layer of Santyl to wound bed daily, fill in wound with saline moistened gauze and cover with dry gauze and silicone foam or ABD pad and clothe tape whichever is preferred.  2.  Cover healing R buttocks wound with silicone foam, lift daily to assess. Change foam q3 days and prn soiling.   12/08/23 1622  Allergies  Allergen Reactions   Fentanyl  Swelling    It messes with her heart.    Nsaids     Other Reaction(s): Not available   Oxycodone  Swelling    Consultations:    Procedures/Studies: US  Venous Img Lower Bilateral Result Date: 12/06/2023 CLINICAL DATA:  Left lower extremity edema. EXAM: LEFT LOWER EXTREMITY VENOUS DOPPLER ULTRASOUND TECHNIQUE: Gray-scale sonography with graded compression, as well as color Doppler and duplex ultrasound were performed to evaluate the lower extremity deep venous systems from the level of the common femoral vein and including the common femoral, femoral, profunda femoral, popliteal and calf veins including the posterior tibial, peroneal and gastrocnemius veins when visible. The superficial great saphenous vein was also interrogated. Spectral Doppler was utilized to evaluate flow at rest and with distal augmentation maneuvers in the common femoral, femoral and popliteal veins. COMPARISON:  None Available. FINDINGS: Contralateral Common Femoral Vein: Respiratory phasicity is normal and symmetric with the symptomatic side. No evidence of thrombus. Normal compressibility. Common Femoral Vein: No evidence of thrombus. Normal compressibility, respiratory phasicity and response  to augmentation. Saphenofemoral Junction: No evidence of thrombus. Normal compressibility and flow on color Doppler imaging. Profunda Femoral Vein: Nonocclusive thrombus in the visualized profunda femoral vein. Femoral Vein: Nonocclusive thrombus in the visualized femoral vein. Popliteal Vein: Occlusive thrombus in the visualized popliteal vein. Calf Veins: Thrombus in the visualized posterior tibial and peroneal veins. Superficial Great Saphenous Vein: No evidence of thrombus. Normal compressibility. Venous Reflux:  None. Other Findings: No evidence of superficial thrombophlebitis or abnormal fluid collection. IMPRESSION: Positive for deep vein thrombosis in the left lower extremity involving the profunda femoral vein, femoral vein, popliteal vein and calf veins. Thrombus is nonocclusive in the profunda femoral and femoral veins and occlusive in the popliteal vein. Electronically Signed   By: Marcey Moan M.D.   On: 12/06/2023 14:38   DG Chest Portable 1 View Result Date: 12/06/2023 EXAM: 1 VIEW(S) XRAY OF THE CHEST 12/06/2023 11:27:36 AM COMPARISON: 11/09/2023 CLINICAL HISTORY: weakness FINDINGS: LINES, TUBES AND DEVICES: Right internal jugular port-a-cath is unchanged. LUNGS AND PLEURA: No focal pulmonary opacity. No pulmonary edema. No pleural effusion. No pneumothorax. HEART AND MEDIASTINUM: No acute abnormality of the cardiac and mediastinal silhouettes. BONES AND SOFT TISSUES: No acute osseous abnormality. IMPRESSION: 1. No acute cardiopulmonary process. Electronically signed by: Lynwood Seip MD 12/06/2023 11:37 AM EST RP Workstation: [id number]   DG Chest Portable 1 View Result Date: 11/09/2023 CLINICAL DATA:  weakness EXAM: PORTABLE CHEST - 1 VIEW COMPARISON:  10/27/2023 FINDINGS: Right chest port is similarly positioned terminating at the cavoatrial junction. No focal airspace consolidation, pleural effusion, or pneumothorax. No cardiomegaly. Aortic atherosclerosis. No acute fracture or  destructive lesions. Multilevel thoracic osteophytosis. Surgical clips in the left upper quadrant. IMPRESSION: No acute cardiopulmonary abnormality. Electronically Signed   By: Rogelia Myers M.D.   On: 11/09/2023 16:59      Discharge Exam: Vitals:   12/08/23 1100 12/08/23 1332  BP:  104/68  Pulse: 89 81  Resp:    Temp: 97.8 F (36.6 C)   SpO2:     Vitals:   12/08/23 0825 12/08/23 1050 12/08/23 1100 12/08/23 1332  BP:  (!) 95/59  104/68  Pulse:  86 89 81  Resp:  17    Temp: (!) 96.9 F (36.1 C) 97.6 F (36.4 C) 97.8 F (36.6 C)   TempSrc:  Oral Oral   SpO2:  99%    Weight:      Height:  The results of significant diagnostics from this hospitalization (including imaging, microbiology, ancillary and laboratory) are listed below for reference.     Microbiology: Recent Results (from the past 240 hours)  Blood culture (routine x 2)     Status: None (Preliminary result)   Collection Time: 12/06/23 11:17 AM   Specimen: BLOOD  Result Value Ref Range Status   Specimen Description BLOOD BLOOD RIGHT FOREARM  Final   Special Requests   Final    BOTTLES DRAWN AEROBIC ONLY Blood Culture results may not be optimal due to an inadequate volume of blood received in culture bottles   Culture   Final    NO GROWTH 2 DAYS Performed at [facility], [address] [place], [place] [address]    Report Status PENDING  Incomplete  Blood culture (routine x 2)     Status: None (Preliminary result)   Collection Time: 12/06/23 11:17 AM   Specimen: BLOOD  Result Value Ref Range Status   Specimen Description BLOOD RIGHT ANTECUBITAL  Final   Special Requests   Final    BOTTLES DRAWN AEROBIC AND ANAEROBIC Blood Culture results may not be optimal due to an inadequate volume of blood received in culture bottles   Culture   Final    NO GROWTH 2 DAYS Performed at [facility], [address], [place], [place] [address]    Report Status PENDING  Incomplete  Resp panel  by RT-PCR (RSV, Flu A&B, Covid) Anterior Nasal Swab     Status: None   Collection Time: 12/06/23 11:17 AM   Specimen: Anterior Nasal Swab  Result Value Ref Range Status   SARS Coronavirus 2 by RT PCR NEGATIVE NEGATIVE Final    Comment: (NOTE) SARS-CoV-2 target nucleic acids are NOT DETECTED.  The SARS-CoV-2 RNA is generally detectable in upper respiratory specimens during the acute phase of infection. The lowest concentration of SARS-CoV-2 viral copies this assay can detect is 138 copies/mL. A negative result does not preclude SARS-Cov-2 infection and should not be used as the sole basis for treatment or other patient management decisions. A negative result may occur with  improper specimen collection/handling, submission of specimen other than nasopharyngeal swab, presence of viral mutation(s) within the areas targeted by this assay, and inadequate number of viral copies(<138 copies/mL). A negative result must be combined with clinical observations, patient history, and epidemiological information. The expected result is Negative.  Fact Sheet for Patients:  bloggercourse.com  Fact Sheet for Healthcare Providers:  seriousbroker.it  This test is no t yet approved or cleared by the [place]  FDA and  has been authorized for detection and/or diagnosis of SARS-CoV-2 by FDA under an Emergency Use Authorization (EUA). This EUA will remain  in effect (meaning this test can be used) for the duration of the COVID-19 declaration under Section 564(b)(1) of the Act, 21 U.S.C.section 360bbb-3(b)(1), unless the authorization is terminated  or revoked sooner.       Influenza A by PCR NEGATIVE NEGATIVE Final   Influenza B by PCR NEGATIVE NEGATIVE Final    Comment: (NOTE) The Xpert Xpress SARS-CoV-2/FLU/RSV plus assay is intended as an aid in the diagnosis of influenza from Nasopharyngeal swab specimens and should not be used as a sole  basis for treatment. Nasal washings and aspirates are unacceptable for Xpert Xpress SARS-CoV-2/FLU/RSV testing.  Fact Sheet for Patients: bloggercourse.com  Fact Sheet for Healthcare Providers: seriousbroker.it  This test is not yet approved or cleared by the [place]  FDA and has been  authorized for detection and/or diagnosis of SARS-CoV-2 by FDA under an Emergency Use Authorization (EUA). This EUA will remain in effect (meaning this test can be used) for the duration of the COVID-19 declaration under Section 564(b)(1) of the Act, 21 U.S.C. section 360bbb-3(b)(1), unless the authorization is terminated or revoked.     Resp Syncytial Virus by PCR NEGATIVE NEGATIVE Final    Comment: (NOTE) Fact Sheet for Patients: bloggercourse.com  Fact Sheet for Healthcare Providers: seriousbroker.it  This test is not yet approved or cleared by the [place]  FDA and has been authorized for detection and/or diagnosis of SARS-CoV-2 by FDA under an Emergency Use Authorization (EUA). This EUA will remain in effect (meaning this test can be used) for the duration of the COVID-19 declaration under Section 564(b)(1) of the Act, 21 U.S.C. section 360bbb-3(b)(1), unless the authorization is terminated or revoked.  Performed at [facility], [address] [place], [place] [address]      Labs: BNP (last 3 results) No results for input(s): BNP in the last 8760 hours. Basic Metabolic Panel: Recent Labs  Lab 12/06/23 1027 12/06/23 1117 12/07/23 0550 12/08/23 0500  NA 143  --  141 139  K 3.4*  --  2.7* 3.7  CL 114*  --  111 114*  CO2 16*  --  20* 19*  GLUCOSE 133*  --  93 113*  BUN 27*  --  26* 23  CREATININE 0.95  --  0.85 0.74  CALCIUM  8.1*  --  8.2* 7.9*  MG  --  1.8  --   --    Liver Function Tests: Recent Labs  Lab 12/06/23 1027 12/07/23 0550  AST 38 38  ALT  13 14  ALKPHOS 121 105  BILITOT 0.8 0.5  PROT 5.7* 5.3*  ALBUMIN 2.6* 2.6*   No results for input(s): LIPASE, AMYLASE in the last 168 hours. No results for input(s): AMMONIA in the last 168 hours. CBC: Recent Labs  Lab 12/06/23 1027 12/07/23 0550 12/08/23 0500 12/08/23 0726  WBC 18.4* 15.3* 17.5*  --   NEUTROABS 10.9*  --   --   --   HGB 10.6* 8.7* 6.8* 6.8*  HCT 33.2* 26.8* 21.0* 21.2*  MCV 97.4 96.1 96.3  --   PLT 229 189 175  --    Cardiac Enzymes: Recent Labs  Lab 12/06/23 1117  CKTOTAL 43   BNP: Invalid input(s): POCBNP CBG: Recent Labs  Lab 12/08/23 0657  GLUCAP 90   D-Dimer No results for input(s): DDIMER in the last 72 hours. Hgb A1c No results for input(s): HGBA1C in the last 72 hours. Lipid Profile No results for input(s): CHOL, HDL, LDLCALC, TRIG, CHOLHDL, LDLDIRECT in the last 72 hours. Thyroid  function studies Recent Labs    12/06/23 1238  TSH 5.050*   Anemia work up No results for input(s): VITAMINB12, FOLATE, FERRITIN, TIBC, IRON, RETICCTPCT in the last 72 hours. Urinalysis    Component Value Date/Time   COLORURINE STRAW (A) 11/09/2023 1650   APPEARANCEUR CLEAR (A) 11/09/2023 1650   LABSPEC 1.005 11/09/2023 1650   PHURINE 6.0 11/09/2023 1650   GLUCOSEU NEGATIVE 11/09/2023 1650   HGBUR LARGE (A) 11/09/2023 1650   BILIRUBINUR NEGATIVE 11/09/2023 1650   BILIRUBINUR negative 07/26/2022 1046   KETONESUR NEGATIVE 11/09/2023 1650   PROTEINUR NEGATIVE 11/09/2023 1650   UROBILINOGEN 0.2 07/26/2022 1046   NITRITE NEGATIVE 11/09/2023 1650   LEUKOCYTESUR NEGATIVE 11/09/2023 1650   Sepsis Labs Recent Labs  Lab 12/06/23 1027 12/07/23 0550 12/08/23 0500  WBC 18.4* 15.3* 17.5*   Microbiology Recent Results (from the past 240 hours)  Blood culture (routine x 2)     Status: None (Preliminary result)   Collection Time: 12/06/23 11:17 AM   Specimen: BLOOD  Result Value Ref Range Status   Specimen  Description BLOOD BLOOD RIGHT FOREARM  Final   Special Requests   Final    BOTTLES DRAWN AEROBIC ONLY Blood Culture results may not be optimal due to an inadequate volume of blood received in culture bottles   Culture   Final    NO GROWTH 2 DAYS Performed at [facility], [address], [place], [place] [address]    Report Status PENDING  Incomplete  Blood culture (routine x 2)     Status: None (Preliminary result)   Collection Time: 12/06/23 11:17 AM   Specimen: BLOOD  Result Value Ref Range Status   Specimen Description BLOOD RIGHT ANTECUBITAL  Final   Special Requests   Final    BOTTLES DRAWN AEROBIC AND ANAEROBIC Blood Culture results may not be optimal due to an inadequate volume of blood received in culture bottles   Culture   Final    NO GROWTH 2 DAYS Performed at [facility], [address], [place], [place] [address]    Report Status PENDING  Incomplete  Resp panel by RT-PCR (RSV, Flu A&B, Covid) Anterior Nasal Swab     Status: None   Collection Time: 12/06/23 11:17 AM   Specimen: Anterior Nasal Swab  Result Value Ref Range Status   SARS Coronavirus 2 by RT PCR NEGATIVE NEGATIVE Final    Comment: (NOTE) SARS-CoV-2 target nucleic acids are NOT DETECTED.  The SARS-CoV-2 RNA is generally detectable in upper respiratory specimens during the acute phase of infection. The lowest concentration of SARS-CoV-2 viral copies this assay can detect is 138 copies/mL. A negative result does not preclude SARS-Cov-2 infection and should not be used as the sole basis for treatment or other patient management decisions. A negative result may occur with  improper specimen collection/handling, submission of specimen other than nasopharyngeal swab, presence of viral mutation(s) within the areas targeted by this assay, and inadequate number of viral copies(<138 copies/mL). A negative result must be combined with clinical observations, patient history, and  epidemiological information. The expected result is Negative.  Fact Sheet for Patients:  bloggercourse.com  Fact Sheet for Healthcare Providers:  seriousbroker.it  This test is no t yet approved or cleared by the [place]  FDA and  has been authorized for detection and/or diagnosis of SARS-CoV-2 by FDA under an Emergency Use Authorization (EUA). This EUA will remain  in effect (meaning this test can be used) for the duration of the COVID-19 declaration under Section 564(b)(1) of the Act, 21 U.S.C.section 360bbb-3(b)(1), unless the authorization is terminated  or revoked sooner.       Influenza A by PCR NEGATIVE NEGATIVE Final   Influenza B by PCR NEGATIVE NEGATIVE Final    Comment: (NOTE) The Xpert Xpress SARS-CoV-2/FLU/RSV plus assay is intended as an aid in the diagnosis of influenza from Nasopharyngeal swab specimens and should not be used as a sole basis for treatment. Nasal washings and aspirates are unacceptable for Xpert Xpress SARS-CoV-2/FLU/RSV testing.  Fact Sheet for Patients: bloggercourse.com  Fact Sheet for Healthcare Providers: seriousbroker.it  This test is not yet approved or cleared by the [place]  FDA and has been authorized for detection and/or diagnosis of SARS-CoV-2 by FDA under an Emergency Use Authorization (EUA). This EUA  will remain in effect (meaning this test can be used) for the duration of the COVID-19 declaration under Section 564(b)(1) of the Act, 21 U.S.C. section 360bbb-3(b)(1), unless the authorization is terminated or revoked.     Resp Syncytial Virus by PCR NEGATIVE NEGATIVE Final    Comment: (NOTE) Fact Sheet for Patients: bloggercourse.com  Fact Sheet for Healthcare Providers: seriousbroker.it  This test is not yet approved or cleared by the [place]  FDA and has been  authorized for detection and/or diagnosis of SARS-CoV-2 by FDA under an Emergency Use Authorization (EUA). This EUA will remain in effect (meaning this test can be used) for the duration of the COVID-19 declaration under Section 564(b)(1) of the Act, 21 U.S.C. section 360bbb-3(b)(1), unless the authorization is terminated or revoked.  Performed at [facility], [address] [place], [place] [address]      Time coordinating discharge: 32 min   SIGNED: Lorane Poland, DO [facility] 12/08/2023, 4:22 PM Pager   If 7PM-7AM, please contact night-coverage

## 2023-12-08 NOTE — Consult Note (Signed)
 Palliative Medicine [facility] at [facility] Telephone:([phone] [phone]   Name: Colleen [name] Date: 12/08/2023 MRN: [id number]  DOB: 01-May-1945  Patient Care Team: Leavy Mole, PA-C as PCP - General (Family Medicine) Darliss Rogue, MD as PCP - Cardiology (Cardiology) Dave Agent, MD as Consulting Physician (Rheumatology) Ceola Tereasa PARAS, MD as Referring Physician (Oncology) Therisa Bi, MD as Consulting Physician (Gastroenterology) Maurie Rayfield BIRCH, RN as Oncology Nurse Navigator Jacobo, Evalene PARAS, MD as Consulting Physician (Oncology)    REASON FOR CONSULTATION: Colleen [name] is a 78 y.o. female with multiple medical problems including stage IV pancreatic cancer on treatment with dose reduced gemcitabine  plus Abraxane .  Patient was hospitalized 10/01/2023 to 10/07/2023 with severe mucositis from chemotherapy.  She was hospitalized again 10/27/2023 to 11/04/2023 with hypotension and rapid A-fib found to have SIRS and worsening anemia.  Patient was readmitted 11/09/2023 to 12/01/2023 with progressive weakness and orthostatic hypotension.  Patient was discharged home with home health.  She is now readmitted 12/06/2023 with A-fib and RVR.  Hospitalization complicated by GI bleed.  Patient transition to comfort care.  Palliative care consulted to address goals.  SOCIAL HISTORY:     reports that she quit smoking about 15 years ago. Her smoking use included cigarettes. She started smoking about 35 years ago. She has a 30 pack-year smoking history. She has never used smokeless tobacco. She reports that she does not currently use alcohol. She reports that she does not use drugs.  Patient is widowed x 2.  She has no children.  She worked as a neurosurgeon.  ADVANCE DIRECTIVES:  Reportedly, her niece is her healthcare power of attorney  CODE STATUS: DNR  PAST MEDICAL HISTORY: Past Medical History:  Diagnosis Date   Asthma    Cataract     COPD (chronic obstructive pulmonary disease) (HCC)    GERD (gastroesophageal reflux disease)    Gout    History of pulmonary embolus (PE)    Hyperlipidemia    Hypertension    Osteoarthrosis    Prediabetes     PAST SURGICAL HISTORY:  Past Surgical History:  Procedure Laterality Date   BACK SURGERY     BREAST BIOPSY Left 04/08/2014   intraductal papilloma 5 mm   COLONOSCOPY WITH PROPOFOL  N/A 04/23/2020   Procedure: COLONOSCOPY WITH PROPOFOL ;  Surgeon: Janalyn Keene NOVAK, MD;  Location: [facility] ENDOSCOPY;  Service: Endoscopy;  Laterality: N/A;   ERCP N/A 08/05/2023   Procedure: ERCP, WITH INTERVENTION IF INDICATED;  Surgeon: Jinny Carmine, MD;  Location: [facility] ENDOSCOPY;  Service: Endoscopy;  Laterality: N/A;   ESOPHAGOGASTRODUODENOSCOPY (EGD) WITH PROPOFOL  N/A 04/23/2020   Procedure: ESOPHAGOGASTRODUODENOSCOPY (EGD) WITH PROPOFOL ;  Surgeon: Janalyn Keene NOVAK, MD;  Location: [facility] ENDOSCOPY;  Service: Endoscopy;  Laterality: N/A;   GIVENS CAPSULE STUDY N/A 01/28/2022   Procedure: GIVENS CAPSULE STUDY;  Surgeon: Therisa Bi, MD;  Location: [facility] ENDOSCOPY;  Service: Gastroenterology;  Laterality: N/A;   IR IMAGING GUIDED PORT INSERTION  09/12/2023   IR INT EXT BILIARY DRAIN WITH CHOLANGIOGRAM  08/08/2023   IR RADIOLOGIST EVAL & MGMT  09/22/2023   IR REMOVAL BILIARY DRAIN  09/22/2023   LUMBAR LAMINECTOMY     TONSILLECTOMY      HEMATOLOGY/ONCOLOGY HISTORY:  Oncology History  Primary pancreatic cancer with metastasis to other site [facility])  09/07/2023 Initial Diagnosis   Adenocarcinoma of pancreas (HCC)   09/07/2023 Cancer Staging   Staging form: Exocrine Pancreas, AJCC 8th Edition - Clinical stage from 09/07/2023: Stage  IV (cT2, cN0, cM1) - Signed by Jacobo Evalene PARAS, MD on 09/07/2023 Stage prefix: Initial diagnosis Total positive nodes: 0   09/20/2023 -  Chemotherapy   Patient is on Treatment Plan : PANCREATIC Abraxane  D1,8,15 + Gemcitabine  D1,8,15 q28d       ALLERGIES:  is allergic to  fentanyl , nsaids, and oxycodone .  MEDICATIONS:  Current Facility-Administered Medications  Medication Dose Route Frequency Provider Last Rate Last Admin   0.9 %  sodium chloride  infusion   Intravenous Continuous Dezii, Alexandra, DO 20 mL/hr at 12/08/23 1102 New Bag at 12/08/23 1102   acetaminophen  (TYLENOL ) tablet 650 mg  650 mg Oral Q6H PRN Patel, Kishan S, RPH       Or   acetaminophen  (TYLENOL ) suppository 650 mg  650 mg Rectal Q6H PRN Patel, Kishan S, RPH       artificial tears ophthalmic solution 1 drop  1 drop Both Eyes QID PRN Dezii, Alexandra, DO       Chlorhexidine  Gluconate Cloth 2 % PADS 6 each  6 each Topical Daily Paudel, Keshab, MD   6 each at 12/07/23 1009   collagenase (SANTYL) ointment   Topical Daily Paudel, Keshab, MD   Given at 12/08/23 0900   famotidine  (PEPCID ) tablet 20 mg  20 mg Oral BID Paudel, Keshab, MD   20 mg at 12/08/23 0856   fludrocortisone (FLORINEF) tablet 0.1 mg  0.1 mg Oral Daily Paudel, Nena, MD   0.1 mg at 12/08/23 9143   glycopyrrolate  (ROBINUL ) tablet 1 mg  1 mg Oral Q4H PRN Dezii, Alexandra, DO       Or   glycopyrrolate  (ROBINUL ) injection 0.2 mg  0.2 mg Subcutaneous Q4H PRN Dezii, Alexandra, DO       Or   glycopyrrolate  (ROBINUL ) injection 0.2 mg  0.2 mg Intravenous Q4H PRN Dezii, Alexandra, DO       LORazepam (ATIVAN) injection 2-4 mg  2-4 mg Intravenous Q4H PRN Dezii, Alexandra, DO       metoprolol  tartrate (LOPRESSOR ) tablet 12.5 mg  12.5 mg Oral Q6H Dezii, Alexandra, DO   12.5 mg at 12/08/23 1332   midodrine  (PROAMATINE ) tablet 15 mg  15 mg Oral TID WC Paudel, Keshab, MD   15 mg at 12/08/23 1333   morphine (PF) 2 MG/ML injection 2 mg  2 mg Intravenous Q4H PRN Paudel, Keshab, MD       ondansetron  (ZOFRAN ) tablet 4 mg  4 mg Oral Q6H PRN Paudel, Keshab, MD       Or   ondansetron  (ZOFRAN ) injection 4 mg  4 mg Intravenous Q6H PRN Paudel, Keshab, MD       Oral care mouth rinse  15 mL Mouth Rinse PRN Paudel, Nena, MD       polyethylene glycol  (MIRALAX  / GLYCOLAX ) packet 17 g  17 g Oral Daily PRN Paudel, Keshab, MD       simethicone  (MYLICON) chewable tablet 80 mg  80 mg Oral Q6H PRN Paudel, Nena, MD       sodium chloride  flush (NS) 0.9 % injection 10-40 mL  10-40 mL Intracatheter Q12H Paudel, Keshab, MD   10 mL at 12/08/23 0859   sodium chloride  flush (NS) 0.9 % injection 10-40 mL  10-40 mL Intracatheter PRN Roann Nena, MD        VITAL SIGNS: BP 104/68   Pulse 81   Temp 97.8 F (36.6 C) (Oral)   Resp 17   Ht 5' 4 (1.626 m)   Wt 155 lb 13.8 oz (  70.7 kg)   SpO2 99%   BMI 26.75 kg/m  Filed Weights   12/06/23 1021  Weight: 155 lb 13.8 oz (70.7 kg)    Estimated body mass index is 26.75 kg/m as calculated from the following:   Height as of this encounter: 5' 4 (1.626 m).   Weight as of this encounter: 155 lb 13.8 oz (70.7 kg).  LABS: CBC:    Component Value Date/Time   WBC 17.5 (H) 12/08/2023 0500   HGB 6.8 (L) 12/08/2023 0726   HGB 9.0 (L) 11/08/2023 0811   HCT 21.2 (L) 12/08/2023 0726   PLT 175 12/08/2023 0500   PLT 534 (H) 11/08/2023 0811   MCV 96.3 12/08/2023 0500   NEUTROABS 10.9 (H) 12/06/2023 1027   LYMPHSABS 3.1 12/06/2023 1027   MONOABS 0.8 12/06/2023 1027   EOSABS 0.0 12/06/2023 1027   BASOSABS 0.1 12/06/2023 1027   Comprehensive Metabolic Panel:    Component Value Date/Time   NA 139 12/08/2023 0500   NA 136 07/01/2015 1133   K 3.7 12/08/2023 0500   CL 114 (H) 12/08/2023 0500   CO2 19 (L) 12/08/2023 0500   BUN 23 12/08/2023 0500   BUN 22 07/01/2015 1133   CREATININE 0.74 12/08/2023 0500   CREATININE 1.07 (H) 11/08/2023 0811   CREATININE 0.88 08/03/2023 1203   GLUCOSE 113 (H) 12/08/2023 0500   CALCIUM  7.9 (L) 12/08/2023 0500   AST 38 12/07/2023 0550   AST 27 11/08/2023 0811   ALT 14 12/07/2023 0550   ALT 14 11/08/2023 0811   ALKPHOS 105 12/07/2023 0550   BILITOT 0.5 12/07/2023 0550   BILITOT 0.9 11/08/2023 0811   PROT 5.3 (L) 12/07/2023 0550   PROT 8.0 07/01/2015 1133   ALBUMIN  2.6 (L) 12/07/2023 0550   ALBUMIN 3.9 07/01/2015 1133    RADIOGRAPHIC STUDIES: US  Venous Img Lower Bilateral Result Date: 12/06/2023 CLINICAL DATA:  Left lower extremity edema. EXAM: LEFT LOWER EXTREMITY VENOUS DOPPLER ULTRASOUND TECHNIQUE: Gray-scale sonography with graded compression, as well as color Doppler and duplex ultrasound were performed to evaluate the lower extremity deep venous systems from the level of the common femoral vein and including the common femoral, femoral, profunda femoral, popliteal and calf veins including the posterior tibial, peroneal and gastrocnemius veins when visible. The superficial great saphenous vein was also interrogated. Spectral Doppler was utilized to evaluate flow at rest and with distal augmentation maneuvers in the common femoral, femoral and popliteal veins. COMPARISON:  None Available. FINDINGS: Contralateral Common Femoral Vein: Respiratory phasicity is normal and symmetric with the symptomatic side. No evidence of thrombus. Normal compressibility. Common Femoral Vein: No evidence of thrombus. Normal compressibility, respiratory phasicity and response to augmentation. Saphenofemoral Junction: No evidence of thrombus. Normal compressibility and flow on color Doppler imaging. Profunda Femoral Vein: Nonocclusive thrombus in the visualized profunda femoral vein. Femoral Vein: Nonocclusive thrombus in the visualized femoral vein. Popliteal Vein: Occlusive thrombus in the visualized popliteal vein. Calf Veins: Thrombus in the visualized posterior tibial and peroneal veins. Superficial Great Saphenous Vein: No evidence of thrombus. Normal compressibility. Venous Reflux:  None. Other Findings: No evidence of superficial thrombophlebitis or abnormal fluid collection. IMPRESSION: Positive for deep vein thrombosis in the left lower extremity involving the profunda femoral vein, femoral vein, popliteal vein and calf veins. Thrombus is nonocclusive in the profunda femoral  and femoral veins and occlusive in the popliteal vein. Electronically Signed   By: Marcey Moan M.D.   On: 12/06/2023 14:38   DG Chest Portable 1 View  Result Date: 12/06/2023 EXAM: 1 VIEW(S) XRAY OF THE CHEST 12/06/2023 11:27:36 AM COMPARISON: 11/09/2023 CLINICAL HISTORY: weakness FINDINGS: LINES, TUBES AND DEVICES: Right internal jugular port-a-cath is unchanged. LUNGS AND PLEURA: No focal pulmonary opacity. No pulmonary edema. No pleural effusion. No pneumothorax. HEART AND MEDIASTINUM: No acute abnormality of the cardiac and mediastinal silhouettes. BONES AND SOFT TISSUES: No acute osseous abnormality. IMPRESSION: 1. No acute cardiopulmonary process. Electronically signed by: Lynwood Seip MD 12/06/2023 11:37 AM EST RP Workstation: [id number]   DG Chest Portable 1 View Result Date: 11/09/2023 CLINICAL DATA:  weakness EXAM: PORTABLE CHEST - 1 VIEW COMPARISON:  10/27/2023 FINDINGS: Right chest port is similarly positioned terminating at the cavoatrial junction. No focal airspace consolidation, pleural effusion, or pneumothorax. No cardiomegaly. Aortic atherosclerosis. No acute fracture or destructive lesions. Multilevel thoracic osteophytosis. Surgical clips in the left upper quadrant. IMPRESSION: No acute cardiopulmonary abnormality. Electronically Signed   By: Rogelia Myers M.D.   On: 11/09/2023 16:59    PERFORMANCE STATUS (ECOG) : 3 - Symptomatic, >50% confined to bed  Review of Systems Unable to complete  Physical Exam General: Ill-appearing Pulmonary: Unlabored Extremities: no edema, no joint deformities Skin: no rashes Neurological: Weakness, lethargic  IMPRESSION: Patient with multiple recent hospitalizations.  Now readmitted with A-fib RVR with hospitalization complicated by GI bleed/rapid response.  Patient was transition to comfort care.  She is currently lethargic and unable to participate meaningfully in a conversation regarding goals.  I called and spoke with her niece,  [name] Mt, who confirms decision for comfort care.  [name] says that she is interested in [facility] for [facility] of life care. Will ask that the hospice liaison coordinate transfer when a bed is available.  PLAN: - Continue comfort care - Hospice Home - DNR/DNI  Case and plan discussed with Dr. Jacobo  Time Total: 30  minutes  Visit consisted of counseling and education dealing with the complex and emotionally intense issues of symptom management and palliative care in the setting of serious and potentially life-threatening illness.Greater than 50%  of this time was spent counseling and coordinating care related to the above assessment and plan.  Signed by: Fonda Mower, PhD, NP-C

## 2023-12-08 NOTE — Plan of Care (Signed)
  Problem: Clinical Measurements: Goal: Ability to maintain clinical measurements within normal limits will improve Outcome: Adequate for Discharge   

## 2023-12-08 NOTE — Progress Notes (Signed)
 Rounding Note   Patient Name: Colleen [name] Date of Encounter: 12/08/2023  [facility] Cardiologist: Redell Cave, MD   Subjective Resting comfortably in bed, receiving transfusion packed red blood cells Rapid response called this morning GI bleed after started on heparin  for DVT  Telemetry reviewed, MAT, started on metoprolol  tartrate 12.5 every 6 hours yesterday A.m. dose held for a.m. for low blood pressure Remains on midodrine  3 times daily and fludrocortisone  Scheduled Meds:  Chlorhexidine  Gluconate Cloth  6 each Topical Daily   collagenase   Topical Daily   famotidine   20 mg Oral BID   fludrocortisone  0.1 mg Oral Daily   metoprolol  tartrate  12.5 mg Oral Q6H   midodrine   15 mg Oral TID WC   sodium chloride  flush  10-40 mL Intracatheter Q12H   Continuous Infusions:  sodium chloride  20 mL/hr at 12/08/23 1102   PRN Meds: acetaminophen  **OR** acetaminophen , artificial tears, glycopyrrolate  **OR** glycopyrrolate  **OR** glycopyrrolate , LORazepam, morphine injection, ondansetron  **OR** ondansetron  (ZOFRAN ) IV, mouth rinse, polyethylene glycol, simethicone , sodium chloride  flush   Vital Signs  Vitals:   12/08/23 0736 12/08/23 0800 12/08/23 0825 12/08/23 1050  BP: (!) 88/65 (!) 97/50  (!) 95/59  Pulse: 90   86  Resp: 17 17  17   Temp: 98.1 F (36.7 C)  (!) 96.9 F (36.1 C) 97.6 F (36.4 C)  TempSrc: Axillary   Oral  SpO2: 98%   99%  Weight:      Height:        Intake/Output Summary (Last 24 hours) at 12/08/2023 1202 Last data filed at 12/08/2023 1050 Gross per 24 hour  Intake 716.78 ml  Output --  Net 716.78 ml      12/06/2023   10:21 AM 11/14/2023    8:35 AM 11/11/2023    5:04 PM  Last 3 Weights  Weight (lbs) 155 lb 13.8 oz 179 lb 7.3 oz --  Weight (kg) 70.7 kg 81.4 kg --      Telemetry Multifocal atrial tachycardia rate 80-120- Personally Reviewed  ECG   - Personally Reviewed  Physical Exam  GEN: No acute distress.   Neck:  No JVD Cardiac: RRR, no murmurs, rubs, or gallops.  Respiratory: Clear to auscultation bilaterally. GI: Soft, nontender, non-distended  MS: No edema; No deformity. Neuro:  Nonfocal  Psych: Normal affect   Labs High Sensitivity Troponin:   Recent Labs  Lab 11/09/23 1617 11/09/23 1826 11/10/23 0117 11/10/23 0657  TROPONINIHS 105* 121* 136* 134*     Chemistry Recent Labs  Lab 12/06/23 1027 12/06/23 1117 12/07/23 0550 12/08/23 0500  NA 143  --  141 139  K 3.4*  --  2.7* 3.7  CL 114*  --  111 114*  CO2 16*  --  20* 19*  GLUCOSE 133*  --  93 113*  BUN 27*  --  26* 23  CREATININE 0.95  --  0.85 0.74  CALCIUM  8.1*  --  8.2* 7.9*  MG  --  1.8  --   --   PROT 5.7*  --  5.3*  --   ALBUMIN 2.6*  --  2.6*  --   AST 38  --  38  --   ALT 13  --  14  --   ALKPHOS 121  --  105  --   BILITOT 0.8  --  0.5  --   GFRNONAA >60  --  >60 >60  ANIONGAP 14  --  10 6    Lipids  No results for input(s): CHOL, TRIG, HDL, LABVLDL, LDLCALC, CHOLHDL in the last 168 hours.  Hematology Recent Labs  Lab 12/06/23 1027 12/07/23 0550 12/08/23 0500 12/08/23 0726  WBC 18.4* 15.3* 17.5*  --   RBC 3.41* 2.79* 2.18*  --   HGB 10.6* 8.7* 6.8* 6.8*  HCT 33.2* 26.8* 21.0* 21.2*  MCV 97.4 96.1 96.3  --   MCH 31.1 31.2 31.2  --   MCHC 31.9 32.5 32.4  --   RDW 21.5* 21.1* 21.3*  --   PLT 229 189 175  --    Thyroid   Recent Labs  Lab 12/06/23 1238  TSH 5.050*    BNP Recent Labs  Lab 12/06/23 1117  PROBNP 4,428.0*    DDimer No results for input(s): DDIMER in the last 168 hours.   Radiology  US  Venous Img Lower Bilateral Result Date: 12/06/2023 CLINICAL DATA:  Left lower extremity edema. EXAM: LEFT LOWER EXTREMITY VENOUS DOPPLER ULTRASOUND TECHNIQUE: Gray-scale sonography with graded compression, as well as color Doppler and duplex ultrasound were performed to evaluate the lower extremity deep venous systems from the level of the common femoral vein and including the common  femoral, femoral, profunda femoral, popliteal and calf veins including the posterior tibial, peroneal and gastrocnemius veins when visible. The superficial great saphenous vein was also interrogated. Spectral Doppler was utilized to evaluate flow at rest and with distal augmentation maneuvers in the common femoral, femoral and popliteal veins. COMPARISON:  None Available. FINDINGS: Contralateral Common Femoral Vein: Respiratory phasicity is normal and symmetric with the symptomatic side. No evidence of thrombus. Normal compressibility. Common Femoral Vein: No evidence of thrombus. Normal compressibility, respiratory phasicity and response to augmentation. Saphenofemoral Junction: No evidence of thrombus. Normal compressibility and flow on color Doppler imaging. Profunda Femoral Vein: Nonocclusive thrombus in the visualized profunda femoral vein. Femoral Vein: Nonocclusive thrombus in the visualized femoral vein. Popliteal Vein: Occlusive thrombus in the visualized popliteal vein. Calf Veins: Thrombus in the visualized posterior tibial and peroneal veins. Superficial Great Saphenous Vein: No evidence of thrombus. Normal compressibility. Venous Reflux:  None. Other Findings: No evidence of superficial thrombophlebitis or abnormal fluid collection. IMPRESSION: Positive for deep vein thrombosis in the left lower extremity involving the profunda femoral vein, femoral vein, popliteal vein and calf veins. Thrombus is nonocclusive in the profunda femoral and femoral veins and occlusive in the popliteal vein. Electronically Signed   By: Marcey Moan M.D.   On: 12/06/2023 14:38    Cardiac Studies   Patient Profile   78 y.o. female with history of stage IV metastatic pancreatic cancer, hypertension, hyperlipidemia, paroxysmal atrial fibrillation, COPD, and gout, whom we have been asked to see due to atrial fibrillation with rapid ventricular response.    Assessment & Plan  Atrial fibrillation with RVR/MAT History  of PAF, previously on amiodarone  without anticoagulation given anemia -Amiodarone  has been held -Was on diltiazem infusion, this was discontinued yesterday and changed to metoprolol  tartrate 12.5 every 6 hours for multifocal atrial tachycardia -Rate stable 80 up to 100 -Titration of metoprolol  limited secondary to hypotension -Not on anticoagulation given metastatic pancreatic cancer and GI bleeds, anemia hemoglobin 8.7   Hypotension In the setting of metastatic pancreatic cancer - On midodrine  15 mg 3 times daily, fludrocortisone 0.1 daily   Chronic anemia Hemoglobin 6.8, GI bleed this morning on heparin  infusion for DVT -Now all anticoagulation on hold, receiving transfusion   Primary pancreatic cancer with metastases Stage IV metastatic pancreatic cancer, followed by oncology Recommend palliative  For questions or updates, please contact [facility] Please consult [email or web address] for contact info under     Signed, Dorthia Tout, MD  12/08/2023, 12:02 PM

## 2023-12-08 NOTE — Progress Notes (Signed)
 [facility] Liaison Note  Received request from Transitions of Care Manger for family interest in [facility].  Eligibility has been confirmed. Spoke withpatient's niece to confirm interest and explain services. Niece agreeable to transfer today.  Transitions of Care manager aware.  RN please call report to [phone] [facility]) prior to patient leaving the unit.  Please send signed and completed DNR with patient at discharge.  Thank you  Marinell Nova,  [facility] Liaison 336 [phone]

## 2023-12-08 NOTE — Significant Event (Signed)
 Rapid Response Event Note   Reason for Call :  GI bleed, hypotension  Initial Focused Assessment:  Rapid response team arrived in patient's room with patient in bed surrounded by 2A staff. Per report, staff had just cleaned up a large bloody BM with dark and fresh blood and clots. Patient's BPs with SBP in 70s and MAPs in 50s (see vital signs flowsheets for full sets of vital signs). Patient alert and oriented, no complaints of dizziness, pain, or shortness of breath. Only complaint that she was cold but reports that she is always cold.  Interventions:  500 mL IV bolus. Scheduled midodrine  given, see eMAR. Unit of blood started.   Plan of Care:  Patient to remain on 2A for now. 2A staff to reach back out to rapid response team with any further needs.  Event Summary:   MD Notified: Dr. Lawence, Dr. Leesa Call Time: 06:57 Arrival Time: 06:58 End Time: 07:38  Beryle Bagsby, LAURAINE NORRIS, RN

## 2023-12-08 NOTE — Plan of Care (Signed)

## 2023-12-08 NOTE — Progress Notes (Signed)
 PROGRESS NOTE    Colleen [name]  FMW:[id number] DOB: [birth year] DOA: 12/06/2023 PCP: Leavy Mole, PA-C  Chief Complaint  Patient presents with   Altered Mental Status   Near Syncope    Hospital Course:  Colleen [name] is a 78 year old female with stage IV pancreatic cancer on chemotherapy, COPD, hypertension, hyperlipidemia, prediabetes, gout, GERD, CKD stage III, anemia, A-fib and history of PE not currently on anticoagulation due to history of GI bleed, lumbar spinal stenosis, who presented on 11/11 with an episode of syncope at home.  Patient was reportedly on the toilet for 5 hours before her niece found her.  On arrival she was found A-fib RVR with a rate of 172.  Patient was started on amiodarone  drip.  She was also found of a leukocytosis of 18.4 and started on broad-spectrum antibiotics.  She had some improvement in her A-fib.  She was then found to have a left lower extremity DVT and started on heparin  drip. Early in the morning on 11/13 patient had large bloody bowel movement and became hypotensive.  Rapid response was called.  She received 1 unit PRBC and fluid boluses with improvement in hypotension.  Subjective: Rapid response 0658 this a.m. for large bloody bowel movement and subsequent hypotension.  Night physician started fluid bolus and ordered for 2 units PRBC.  By the time of my evaluation at bedside patient's blood pressure had normalized towards her baseline.  She was alert and oriented.  Heparin  drip has been discontinued.  First unit PRBC is running.  Upon further discussion with the patient she is amendable to transitioning to hospice now.  She would like for me to clarify this with her niece.  I have reached out to [employer] and left a voicemail.  I will continue to try and reach her throughout the day today.   For now we will maintain a MAP goal of 55.  At baseline patient is hypotensive on midodrine  and fludrocortisone.  Heparin  drip has been discontinued.  She  will receive 1 unit PRBC.  We will hold the other unit in light of transition to comfort measures only.  Objective: Vitals:   12/08/23 0700 12/08/23 0705 12/08/23 0715 12/08/23 0736  BP: (!) 80/57 (!) 80/61 (!) 67/50 (!) 88/65  Pulse:  98 (!) 109 90  Resp: 16 18 17 17   Temp:   (!) 96.5 F (35.8 C) 98.1 F (36.7 C)  TempSrc:   Axillary Axillary  SpO2:  92% 100% 98%  Weight:      Height:        Intake/Output Summary (Last 24 hours) at 12/08/2023 0747 Last data filed at 12/08/2023 0200 Gross per 24 hour  Intake 538.78 ml  Output --  Net 538.78 ml   Filed Weights   12/06/23 1021  Weight: 70.7 kg    Examination: General exam: Appears calm and comfortable, NAD, weak and frail appearing Respiratory system: No work of breathing, symmetric chest wall expansion Cardiovascular system: S1 & S2 heard, RRR.  Gastrointestinal system: Abdomen is nondistended, soft and nontender.  Neuro: Alert and oriented.  Extremities: Symmetric, expected ROM Skin: No rashes, lesions  Assessment & Plan:  Principal Problem:   Atrial fibrillation with RVR (HCC) Active Problems:   Orthostatic hypotension   Primary pancreatic cancer with metastasis to other site [facility])   Syncope   Anemia of chronic disease   GERD (gastroesophageal reflux disease)   Sepsis (HCC)   Multifocal atrial tachycardia    Poor prognosis - Patient  has multiple comorbidities including stage IV pancreatic cancer, severe hypotension, A-fib RVR, new DVT not tolerating anticoagulation, history of GI bleeds, progressive weakness, increasing hospital admissions.  She has been considering hospice, and palliative care was consulted.  In light of her rapid response today and now discontinuation of anticoagulation she reports being aware that she is at the end of life.  She would like to speak with her niece to confirm hospice decisions today.  She reports she will be unable to be hospice at home and would need placement in a facility.  I  will consult TOC for these arrangements.    Acute on chronic Hypotension - In the setting of metastatic prostate cancer - Exacerbated by acute blood loss today.  Received 1 L NS bolus and 1 unit PRBC. - Takes midodrine  15 mg 3 times daily at home as well as fludrocortisone.  Continue these for now. - MAP goal 55  A-fib RVR History of paroxysmal atrial fibrillation - Previously on amiodarone  without anticoagulation due to anemia/prior GIB - Cardiology was consulted.  Have discontinued diltiazem and started on metoprolol  to tartrate.  Unlikely to tolerate metoprolol  like current blood pressure. - Heparin  drip discontinued  Left leg DVT - Was initiated on heparin  drip but unable to tolerate given large bloody bowel movement today.  Has been discontinued.  Leukocytosis - Unclear etiology.  Complicated by active malignancy and new DVT. - COVID/flu/RSV negative - Blood cultures negative to date, will continue to follow - CXR without acute cardiopulmonary process.  Patient denies any urinary tract symptoms. - She is currently on broad-spectrum antibiotics with persistent leukocytosis.  No fever.  Will continue antibiotics for a full 48 hours and then consider discontinuation.  Hypokalemia - Continue to replace as needed.  Repeat in AM.  Stage IV metastatic pancreatic cancer - Follows outpatient with oncology - Palliative care consulted, appreciate their discussions as she is well-established with the palliative oncology team  Chronic anemia Of chronic disease - Currently stable - Trend CBC  Hyperlipidemia - Minimal benefit to statin at this time.  DVT prophylaxis: not SCD appropriate given DVT. Heparin  DCd   Code Status: Limited: Do not attempt resuscitation (DNR) -DNR-LIMITED -Do Not Intubate/DNI  Disposition:  Inpatient pending clinical resolution. Continue to try and reach Niece for GOC  Consultants:  Treatment Team:  Consulting Physician: Mady Bruckner, MD  Procedures:     Antimicrobials:  Anti-infectives (From admission, onward)    Start     Dose/Rate Route Frequency Ordered Stop   12/07/23 1400  vancomycin (VANCOCIN) IVPB 1000 mg/200 mL premix        1,000 mg 200 mL/hr over 60 Minutes Intravenous Every 24 hours 12/06/23 1340     12/07/23 0130  ceFEPIme (MAXIPIME) 2 g in sodium chloride  0.9 % 100 mL IVPB        2 g 200 mL/hr over 30 Minutes Intravenous Every 12 hours 12/06/23 1327     12/06/23 2200  metroNIDAZOLE (FLAGYL) IVPB 500 mg  Status:  Discontinued        500 mg 100 mL/hr over 60 Minutes Intravenous Every 12 hours 12/06/23 1315 12/06/23 1342   12/06/23 1400  metroNIDAZOLE (FLAGYL) IVPB 500 mg        500 mg 100 mL/hr over 60 Minutes Intravenous Every 12 hours 12/06/23 1342     12/06/23 1330  vancomycin (VANCOREADY) IVPB 1750 mg/350 mL        1,750 mg 175 mL/hr over 120 Minutes Intravenous Once 12/06/23 1329 12/06/23  1708   12/06/23 1245  ceFEPIme (MAXIPIME) 2 g in sodium chloride  0.9 % 100 mL IVPB        2 g 200 mL/hr over 30 Minutes Intravenous  Once 12/06/23 1244 12/06/23 1346   12/06/23 1245  metroNIDAZOLE (FLAGYL) IVPB 500 mg  Status:  Discontinued        500 mg 100 mL/hr over 60 Minutes Intravenous  Once 12/06/23 1244 12/06/23 1342   12/06/23 1245  vancomycin (VANCOCIN) IVPB 1000 mg/200 mL premix  Status:  Discontinued        1,000 mg 200 mL/hr over 60 Minutes Intravenous  Once 12/06/23 1244 12/06/23 1329       Data Reviewed: I have personally reviewed following labs and imaging studies CBC: Recent Labs  Lab 12/06/23 1027 12/07/23 0550 12/08/23 0500  WBC 18.4* 15.3* 17.5*  NEUTROABS 10.9*  --   --   HGB 10.6* 8.7* 6.8*  HCT 33.2* 26.8* 21.0*  MCV 97.4 96.1 96.3  PLT 229 189 175   Basic Metabolic Panel: Recent Labs  Lab 12/06/23 1027 12/06/23 1117 12/07/23 0550 12/08/23 0500  NA 143  --  141 139  K 3.4*  --  2.7* 3.7  CL 114*  --  111 114*  CO2 16*  --  20* 19*  GLUCOSE 133*  --  93 113*  BUN 27*  --  26* 23   CREATININE 0.95  --  0.85 0.74  CALCIUM  8.1*  --  8.2* 7.9*  MG  --  1.8  --   --    GFR: Estimated Creatinine Clearance: 56.8 mL/min (by C-G formula based on SCr of 0.74 mg/dL). Liver Function Tests: Recent Labs  Lab 12/06/23 1027 12/07/23 0550  AST 38 38  ALT 13 14  ALKPHOS 121 105  BILITOT 0.8 0.5  PROT 5.7* 5.3*  ALBUMIN 2.6* 2.6*   CBG: Recent Labs  Lab 12/08/23 0657  GLUCAP 90    Recent Results (from the past 240 hours)  Blood culture (routine x 2)     Status: None (Preliminary result)   Collection Time: 12/06/23 11:17 AM   Specimen: BLOOD  Result Value Ref Range Status   Specimen Description BLOOD BLOOD RIGHT FOREARM  Final   Special Requests   Final    BOTTLES DRAWN AEROBIC ONLY Blood Culture results may not be optimal due to an inadequate volume of blood received in culture bottles   Culture   Final    NO GROWTH 2 DAYS Performed at [facility], [address], [place], [place] [address]    Report Status PENDING  Incomplete  Blood culture (routine x 2)     Status: None (Preliminary result)   Collection Time: 12/06/23 11:17 AM   Specimen: BLOOD  Result Value Ref Range Status   Specimen Description BLOOD RIGHT ANTECUBITAL  Final   Special Requests   Final    BOTTLES DRAWN AEROBIC AND ANAEROBIC Blood Culture results may not be optimal due to an inadequate volume of blood received in culture bottles   Culture   Final    NO GROWTH 2 DAYS Performed at [facility], [address], [place], [place] [address]    Report Status PENDING  Incomplete  Resp panel by RT-PCR (RSV, Flu A&B, Covid) Anterior Nasal Swab     Status: None   Collection Time: 12/06/23 11:17 AM   Specimen: Anterior Nasal Swab  Result Value Ref Range Status   SARS Coronavirus 2 by RT PCR NEGATIVE NEGATIVE  Final    Comment: (NOTE) SARS-CoV-2 target nucleic acids are NOT DETECTED.  The SARS-CoV-2 RNA is generally detectable in upper respiratory specimens during the acute  phase of infection. The lowest concentration of SARS-CoV-2 viral copies this assay can detect is 138 copies/mL. A negative result does not preclude SARS-Cov-2 infection and should not be used as the sole basis for treatment or other patient management decisions. A negative result may occur with  improper specimen collection/handling, submission of specimen other than nasopharyngeal swab, presence of viral mutation(s) within the areas targeted by this assay, and inadequate number of viral copies(<138 copies/mL). A negative result must be combined with clinical observations, patient history, and epidemiological information. The expected result is Negative.  Fact Sheet for Patients:  bloggercourse.com  Fact Sheet for Healthcare Providers:  seriousbroker.it  This test is no t yet approved or cleared by the [place]  FDA and  has been authorized for detection and/or diagnosis of SARS-CoV-2 by FDA under an Emergency Use Authorization (EUA). This EUA will remain  in effect (meaning this test can be used) for the duration of the COVID-19 declaration under Section 564(b)(1) of the Act, 21 U.S.C.section 360bbb-3(b)(1), unless the authorization is terminated  or revoked sooner.       Influenza A by PCR NEGATIVE NEGATIVE Final   Influenza B by PCR NEGATIVE NEGATIVE Final    Comment: (NOTE) The Xpert Xpress SARS-CoV-2/FLU/RSV plus assay is intended as an aid in the diagnosis of influenza from Nasopharyngeal swab specimens and should not be used as a sole basis for treatment. Nasal washings and aspirates are unacceptable for Xpert Xpress SARS-CoV-2/FLU/RSV testing.  Fact Sheet for Patients: bloggercourse.com  Fact Sheet for Healthcare Providers: seriousbroker.it  This test is not yet approved or cleared by the [place]  FDA and has been authorized for detection and/or diagnosis of  SARS-CoV-2 by FDA under an Emergency Use Authorization (EUA). This EUA will remain in effect (meaning this test can be used) for the duration of the COVID-19 declaration under Section 564(b)(1) of the Act, 21 U.S.C. section 360bbb-3(b)(1), unless the authorization is terminated or revoked.     Resp Syncytial Virus by PCR NEGATIVE NEGATIVE Final    Comment: (NOTE) Fact Sheet for Patients: bloggercourse.com  Fact Sheet for Healthcare Providers: seriousbroker.it  This test is not yet approved or cleared by the [place]  FDA and has been authorized for detection and/or diagnosis of SARS-CoV-2 by FDA under an Emergency Use Authorization (EUA). This EUA will remain in effect (meaning this test can be used) for the duration of the COVID-19 declaration under Section 564(b)(1) of the Act, 21 U.S.C. section 360bbb-3(b)(1), unless the authorization is terminated or revoked.  Performed at [facility], [address], [place], [place] [address]      Radiology Studies: US  Venous Img Lower Bilateral Result Date: 12/06/2023 CLINICAL DATA:  Left lower extremity edema. EXAM: LEFT LOWER EXTREMITY VENOUS DOPPLER ULTRASOUND TECHNIQUE: Gray-scale sonography with graded compression, as well as color Doppler and duplex ultrasound were performed to evaluate the lower extremity deep venous systems from the level of the common femoral vein and including the common femoral, femoral, profunda femoral, popliteal and calf veins including the posterior tibial, peroneal and gastrocnemius veins when visible. The superficial great saphenous vein was also interrogated. Spectral Doppler was utilized to evaluate flow at rest and with distal augmentation maneuvers in the common femoral, femoral and popliteal veins. COMPARISON:  None Available. FINDINGS: Contralateral Common Femoral Vein: Respiratory phasicity is normal and symmetric with  the symptomatic side.  No evidence of thrombus. Normal compressibility. Common Femoral Vein: No evidence of thrombus. Normal compressibility, respiratory phasicity and response to augmentation. Saphenofemoral Junction: No evidence of thrombus. Normal compressibility and flow on color Doppler imaging. Profunda Femoral Vein: Nonocclusive thrombus in the visualized profunda femoral vein. Femoral Vein: Nonocclusive thrombus in the visualized femoral vein. Popliteal Vein: Occlusive thrombus in the visualized popliteal vein. Calf Veins: Thrombus in the visualized posterior tibial and peroneal veins. Superficial Great Saphenous Vein: No evidence of thrombus. Normal compressibility. Venous Reflux:  None. Other Findings: No evidence of superficial thrombophlebitis or abnormal fluid collection. IMPRESSION: Positive for deep vein thrombosis in the left lower extremity involving the profunda femoral vein, femoral vein, popliteal vein and calf veins. Thrombus is nonocclusive in the profunda femoral and femoral veins and occlusive in the popliteal vein. Electronically Signed   By: Marcey Moan M.D.   On: 12/06/2023 14:38   DG Chest Portable 1 View Result Date: 12/06/2023 EXAM: 1 VIEW(S) XRAY OF THE CHEST 12/06/2023 11:27:36 AM COMPARISON: 11/09/2023 CLINICAL HISTORY: weakness FINDINGS: LINES, TUBES AND DEVICES: Right internal jugular port-a-cath is unchanged. LUNGS AND PLEURA: No focal pulmonary opacity. No pulmonary edema. No pleural effusion. No pneumothorax. HEART AND MEDIASTINUM: No acute abnormality of the cardiac and mediastinal silhouettes. BONES AND SOFT TISSUES: No acute osseous abnormality. IMPRESSION: 1. No acute cardiopulmonary process. Electronically signed by: Lynwood Seip MD 12/06/2023 11:37 AM EST RP Workstation: [id number]    Scheduled Meds:  allopurinol   200 mg Oral Daily   atorvastatin  10 mg Oral Daily   Chlorhexidine  Gluconate Cloth  6 each Topical Daily   collagenase   Topical Daily   famotidine   20 mg Oral BID    fludrocortisone  0.1 mg Oral Daily   metoprolol  tartrate  12.5 mg Oral Q6H   midodrine   15 mg Oral TID WC   sodium chloride  flush  10-40 mL Intracatheter Q12H   Continuous Infusions:  ceFEPime (MAXIPIME) IV 2 g (12/08/23 0139)   heparin  Stopped (12/08/23 9374)   metronidazole 500 mg (12/08/23 0227)   vancomycin 1,000 mg (12/07/23 1554)     LOS: 2 days  MDM: Patient is high risk for one or more organ failure.  They necessitate ongoing hospitalization for continued IV therapies and subsequent lab monitoring. Total time spent interpreting labs and vitals, reviewing the medical record, coordinating care amongst consultants and care team members, directly assessing and discussing care with the patient and/or family: 55 min   Jaydah Stahle, DO [facility]  To contact the attending physician between 7A-7P please use Epic Chat. To contact the covering physician during after hours 7P-7A, please review Amion.  12/08/2023, 7:47 AM   *This document has been created with the assistance of dictation software. Please excuse typographical errors. *

## 2023-12-08 NOTE — IPAL (Signed)
 I was able to reach the patient's niece Colleen [name] by phone.  We discussed the patient's rapid decline this morning and the complexities of her ongoing care.  We discussed that the patient herself has endorsed feeling ready for transition to hospice.  Colleen [name], POA, reports that it is always her goal to honor Colleen [name]'s wishes.  She reports that she and Colleen [name] have also discussed hospice and she is ready to make this transition.  I have made the transition to comfort measures only.  We will discontinue all lab draws, discontinue telemetry monitoring, discontinue all medications that do not immediately provide the patient comfort.Qshift vitals, pleasure feeds, unlimited visitor status ordered.  Change in care plan has been communicated to bedside RN Delon and charge nurse Rosina.

## 2023-12-09 NOTE — Progress Notes (Signed)
   Progress Note   Date: 12/08/2023  Patient Name: Colleen [name]        MRN#: [id number]   Clarification of the diagnosis of pressure ulcer(s):   Pressure injury  , present on admission (at the time of the admission order)

## 2023-12-10 LAB — BPAM RBC
Blood Product Expiration Date: 202512152359
Blood Product Expiration Date: 202512152359
ISSUE DATE / TIME: 202511130718
Unit Type and Rh: 6200
Unit Type and Rh: 6200

## 2023-12-10 LAB — TYPE AND SCREEN
ABO/RH(D): A POS
Antibody Screen: NEGATIVE
Unit division: 0
Unit division: 0

## 2023-12-10 LAB — PREPARE RBC (CROSSMATCH)

## 2023-12-11 LAB — CULTURE, BLOOD (ROUTINE X 2)
Culture: NO GROWTH
Culture: NO GROWTH

## 2023-12-13 ENCOUNTER — Ambulatory Visit

## 2023-12-13 ENCOUNTER — Ambulatory Visit: Admitting: Oncology

## 2023-12-13 ENCOUNTER — Other Ambulatory Visit

## 2023-12-13 NOTE — Telephone Encounter (Unsigned)
 Copied from CRM [id number]. Topic: General - Call Back - No Documentation >> Dec 13, 2023 11:23 AM Olam RAMAN wrote: Reason for CRM: Jon from [facility] home health [phone] was calling about orders sent to Colleen [name] and PA was sighing for [name] since [name] is out right now. Orders were sent but no signature with date.  Fax: [phone] ORDER NUMBER [id number] Please resend

## 2023-12-15 NOTE — Telephone Encounter (Signed)
 Orders say just for review, but printed and refaxed

## 2023-12-20 ENCOUNTER — Inpatient Hospital Stay

## 2023-12-20 ENCOUNTER — Other Ambulatory Visit

## 2023-12-20 ENCOUNTER — Ambulatory Visit

## 2023-12-20 ENCOUNTER — Inpatient Hospital Stay: Admitting: Nurse Practitioner

## 2023-12-20 ENCOUNTER — Ambulatory Visit: Admitting: Oncology

## 2023-12-20 ENCOUNTER — Inpatient Hospital Stay: Admitting: Oncology

## 2023-12-27 ENCOUNTER — Inpatient Hospital Stay

## 2023-12-27 ENCOUNTER — Inpatient Hospital Stay: Admitting: Oncology

## 2023-12-27 ENCOUNTER — Other Ambulatory Visit

## 2023-12-27 ENCOUNTER — Ambulatory Visit: Admitting: Oncology

## 2023-12-27 ENCOUNTER — Ambulatory Visit

## 2024-01-03 ENCOUNTER — Inpatient Hospital Stay

## 2024-01-03 ENCOUNTER — Ambulatory Visit: Admitting: Oncology

## 2024-01-03 ENCOUNTER — Other Ambulatory Visit

## 2024-01-03 ENCOUNTER — Ambulatory Visit

## 2024-01-03 ENCOUNTER — Inpatient Hospital Stay: Admitting: Oncology

## 2024-02-26 DEATH — deceased
